# Patient Record
Sex: Female | Born: 1935 | Race: Black or African American | Hispanic: No | Smoking: Former smoker
Health system: Southern US, Community
[De-identification: ages and names within clinical notes are randomized; demographics above are authoritative.]

## PROBLEM LIST (undated history)

## (undated) DIAGNOSIS — N183 Chronic kidney disease, stage 3 unspecified: Secondary | ICD-10-CM

## (undated) DIAGNOSIS — R0600 Dyspnea, unspecified: Secondary | ICD-10-CM

## (undated) DIAGNOSIS — J189 Pneumonia, unspecified organism: Secondary | ICD-10-CM

## (undated) DIAGNOSIS — I639 Cerebral infarction, unspecified: Secondary | ICD-10-CM

## (undated) DIAGNOSIS — E785 Hyperlipidemia, unspecified: Secondary | ICD-10-CM

## (undated) DIAGNOSIS — I2510 Atherosclerotic heart disease of native coronary artery without angina pectoris: Secondary | ICD-10-CM

## (undated) DIAGNOSIS — I351 Nonrheumatic aortic (valve) insufficiency: Secondary | ICD-10-CM

## (undated) DIAGNOSIS — I509 Heart failure, unspecified: Secondary | ICD-10-CM

## (undated) DIAGNOSIS — K635 Polyp of colon: Secondary | ICD-10-CM

## (undated) DIAGNOSIS — E559 Vitamin D deficiency, unspecified: Secondary | ICD-10-CM

## (undated) DIAGNOSIS — H02105 Unspecified ectropion of left lower eyelid: Secondary | ICD-10-CM

## (undated) DIAGNOSIS — M1990 Unspecified osteoarthritis, unspecified site: Secondary | ICD-10-CM

## (undated) DIAGNOSIS — K219 Gastro-esophageal reflux disease without esophagitis: Secondary | ICD-10-CM

## (undated) DIAGNOSIS — I10 Essential (primary) hypertension: Secondary | ICD-10-CM

## (undated) DIAGNOSIS — D649 Anemia, unspecified: Secondary | ICD-10-CM

## (undated) HISTORY — DX: Hyperlipidemia, unspecified: E78.5

## (undated) HISTORY — PX: ABDOMINAL HYSTERECTOMY: SHX81

## (undated) HISTORY — DX: Gastro-esophageal reflux disease without esophagitis: K21.9

## (undated) HISTORY — DX: Nonrheumatic aortic (valve) insufficiency: I35.1

## (undated) HISTORY — DX: Anemia, unspecified: D64.9

## (undated) HISTORY — PX: CARDIAC CATHETERIZATION: SHX172

## (undated) HISTORY — DX: Cerebral infarction, unspecified: I63.9

## (undated) HISTORY — DX: Vitamin D deficiency, unspecified: E55.9

## (undated) HISTORY — PX: APPENDECTOMY: SHX54

## (undated) HISTORY — DX: Unspecified osteoarthritis, unspecified site: M19.90

## (undated) HISTORY — DX: Essential (primary) hypertension: I10

## (undated) HISTORY — DX: Atherosclerotic heart disease of native coronary artery without angina pectoris: I25.10

## (undated) HISTORY — DX: Polyp of colon: K63.5

## (undated) HISTORY — PX: TONSILLECTOMY: SUR1361

---

## 1973-07-01 DIAGNOSIS — I639 Cerebral infarction, unspecified: Secondary | ICD-10-CM

## 1973-07-01 HISTORY — DX: Cerebral infarction, unspecified: I63.9

## 1997-11-24 ENCOUNTER — Encounter: Admission: RE | Admit: 1997-11-24 | Discharge: 1997-11-24 | Payer: Self-pay | Admitting: Internal Medicine

## 1998-01-12 ENCOUNTER — Encounter: Admission: RE | Admit: 1998-01-12 | Discharge: 1998-01-12 | Payer: Self-pay | Admitting: Hematology and Oncology

## 1998-01-20 ENCOUNTER — Encounter: Admission: RE | Admit: 1998-01-20 | Discharge: 1998-01-20 | Payer: Self-pay | Admitting: Hematology and Oncology

## 1998-02-24 ENCOUNTER — Ambulatory Visit (HOSPITAL_BASED_OUTPATIENT_CLINIC_OR_DEPARTMENT_OTHER): Admission: RE | Admit: 1998-02-24 | Discharge: 1998-02-24 | Payer: Self-pay | Admitting: Orthopedic Surgery

## 1998-03-15 ENCOUNTER — Encounter: Admission: RE | Admit: 1998-03-15 | Discharge: 1998-06-13 | Payer: Self-pay | Admitting: Orthopedic Surgery

## 1998-03-24 ENCOUNTER — Encounter: Admission: RE | Admit: 1998-03-24 | Discharge: 1998-03-24 | Payer: Self-pay | Admitting: Internal Medicine

## 1998-04-07 ENCOUNTER — Encounter: Admission: RE | Admit: 1998-04-07 | Discharge: 1998-04-07 | Payer: Self-pay | Admitting: Internal Medicine

## 1998-04-07 ENCOUNTER — Ambulatory Visit (HOSPITAL_COMMUNITY): Admission: RE | Admit: 1998-04-07 | Discharge: 1998-04-07 | Payer: Self-pay | Admitting: Internal Medicine

## 1998-06-29 ENCOUNTER — Encounter: Payer: Self-pay | Admitting: Internal Medicine

## 1998-06-29 ENCOUNTER — Ambulatory Visit: Admission: RE | Admit: 1998-06-29 | Discharge: 1998-06-29 | Payer: Self-pay | Admitting: Internal Medicine

## 1998-07-05 ENCOUNTER — Ambulatory Visit (HOSPITAL_COMMUNITY): Admission: RE | Admit: 1998-07-05 | Discharge: 1998-07-05 | Payer: Self-pay | Admitting: Orthopedic Surgery

## 1998-07-12 ENCOUNTER — Encounter: Admission: RE | Admit: 1998-07-12 | Discharge: 1998-07-12 | Payer: Self-pay | Admitting: Hematology and Oncology

## 1998-08-17 ENCOUNTER — Encounter: Admission: RE | Admit: 1998-08-17 | Discharge: 1998-08-17 | Payer: Self-pay | Admitting: Hematology and Oncology

## 1998-09-02 ENCOUNTER — Emergency Department (HOSPITAL_COMMUNITY): Admission: EM | Admit: 1998-09-02 | Discharge: 1998-09-02 | Payer: Self-pay | Admitting: Emergency Medicine

## 1998-09-02 ENCOUNTER — Encounter: Payer: Self-pay | Admitting: Emergency Medicine

## 1998-09-04 ENCOUNTER — Encounter: Admission: RE | Admit: 1998-09-04 | Discharge: 1998-09-04 | Payer: Self-pay | Admitting: Internal Medicine

## 1998-09-25 ENCOUNTER — Encounter: Admission: RE | Admit: 1998-09-25 | Discharge: 1998-09-25 | Payer: Self-pay | Admitting: Internal Medicine

## 1998-10-04 ENCOUNTER — Encounter: Admission: RE | Admit: 1998-10-04 | Discharge: 1998-10-04 | Payer: Self-pay | Admitting: Hematology and Oncology

## 1998-10-21 ENCOUNTER — Emergency Department (HOSPITAL_COMMUNITY): Admission: EM | Admit: 1998-10-21 | Discharge: 1998-10-21 | Payer: Self-pay | Admitting: Emergency Medicine

## 1998-10-26 ENCOUNTER — Encounter: Admission: RE | Admit: 1998-10-26 | Discharge: 1998-10-26 | Payer: Self-pay | Admitting: Internal Medicine

## 1998-10-26 ENCOUNTER — Ambulatory Visit (HOSPITAL_COMMUNITY): Admission: RE | Admit: 1998-10-26 | Discharge: 1998-10-26 | Payer: Self-pay | Admitting: Internal Medicine

## 1998-10-26 ENCOUNTER — Encounter: Payer: Self-pay | Admitting: Internal Medicine

## 1998-11-13 ENCOUNTER — Encounter: Admission: RE | Admit: 1998-11-13 | Discharge: 1998-11-13 | Payer: Self-pay | Admitting: Internal Medicine

## 1998-12-19 ENCOUNTER — Encounter: Admission: RE | Admit: 1998-12-19 | Discharge: 1998-12-19 | Payer: Self-pay | Admitting: Internal Medicine

## 1999-01-12 ENCOUNTER — Encounter: Admission: RE | Admit: 1999-01-12 | Discharge: 1999-01-12 | Payer: Self-pay | Admitting: Internal Medicine

## 1999-01-14 ENCOUNTER — Encounter: Payer: Self-pay | Admitting: Emergency Medicine

## 1999-01-14 ENCOUNTER — Emergency Department (HOSPITAL_COMMUNITY): Admission: EM | Admit: 1999-01-14 | Discharge: 1999-01-14 | Payer: Self-pay | Admitting: Emergency Medicine

## 1999-03-13 ENCOUNTER — Encounter: Admission: RE | Admit: 1999-03-13 | Discharge: 1999-03-13 | Payer: Self-pay | Admitting: Internal Medicine

## 1999-03-15 ENCOUNTER — Encounter: Admission: RE | Admit: 1999-03-15 | Discharge: 1999-03-15 | Payer: Self-pay | Admitting: Hematology and Oncology

## 1999-04-04 ENCOUNTER — Encounter: Admission: RE | Admit: 1999-04-04 | Discharge: 1999-04-04 | Payer: Self-pay | Admitting: Internal Medicine

## 1999-05-04 ENCOUNTER — Encounter: Admission: RE | Admit: 1999-05-04 | Discharge: 1999-05-04 | Payer: Self-pay | Admitting: Internal Medicine

## 1999-05-04 ENCOUNTER — Ambulatory Visit (HOSPITAL_COMMUNITY): Admission: RE | Admit: 1999-05-04 | Discharge: 1999-05-04 | Payer: Self-pay | Admitting: Internal Medicine

## 1999-05-18 ENCOUNTER — Encounter: Admission: RE | Admit: 1999-05-18 | Discharge: 1999-05-18 | Payer: Self-pay | Admitting: Internal Medicine

## 1999-06-01 ENCOUNTER — Encounter: Admission: RE | Admit: 1999-06-01 | Discharge: 1999-06-01 | Payer: Self-pay | Admitting: Internal Medicine

## 1999-07-10 ENCOUNTER — Encounter: Admission: RE | Admit: 1999-07-10 | Discharge: 1999-07-10 | Payer: Self-pay | Admitting: Internal Medicine

## 1999-08-15 ENCOUNTER — Ambulatory Visit (HOSPITAL_COMMUNITY): Admission: RE | Admit: 1999-08-15 | Discharge: 1999-08-15 | Payer: Self-pay | Admitting: Internal Medicine

## 1999-09-21 ENCOUNTER — Encounter: Admission: RE | Admit: 1999-09-21 | Discharge: 1999-09-21 | Payer: Self-pay | Admitting: Internal Medicine

## 1999-10-30 ENCOUNTER — Encounter: Admission: RE | Admit: 1999-10-30 | Discharge: 1999-10-30 | Payer: Self-pay | Admitting: Internal Medicine

## 1999-11-20 ENCOUNTER — Encounter: Admission: RE | Admit: 1999-11-20 | Discharge: 1999-11-20 | Payer: Self-pay | Admitting: Internal Medicine

## 2000-01-23 ENCOUNTER — Ambulatory Visit (HOSPITAL_COMMUNITY): Admission: RE | Admit: 2000-01-23 | Discharge: 2000-01-23 | Payer: Self-pay | Admitting: Internal Medicine

## 2000-01-23 ENCOUNTER — Encounter: Payer: Self-pay | Admitting: Internal Medicine

## 2000-02-17 ENCOUNTER — Emergency Department (HOSPITAL_COMMUNITY): Admission: EM | Admit: 2000-02-17 | Discharge: 2000-02-17 | Payer: Self-pay | Admitting: Emergency Medicine

## 2000-03-28 ENCOUNTER — Ambulatory Visit (HOSPITAL_BASED_OUTPATIENT_CLINIC_OR_DEPARTMENT_OTHER): Admission: RE | Admit: 2000-03-28 | Discharge: 2000-03-28 | Payer: Self-pay | Admitting: Orthopedic Surgery

## 2000-04-17 ENCOUNTER — Encounter: Admission: RE | Admit: 2000-04-17 | Discharge: 2000-05-01 | Payer: Self-pay | Admitting: Orthopedic Surgery

## 2000-05-12 ENCOUNTER — Encounter: Admission: RE | Admit: 2000-05-12 | Discharge: 2000-05-12 | Payer: Self-pay | Admitting: Internal Medicine

## 2000-08-21 ENCOUNTER — Ambulatory Visit (HOSPITAL_COMMUNITY): Admission: RE | Admit: 2000-08-21 | Discharge: 2000-08-21 | Payer: Self-pay | Admitting: Internal Medicine

## 2000-08-26 ENCOUNTER — Encounter: Admission: RE | Admit: 2000-08-26 | Discharge: 2000-08-26 | Payer: Self-pay | Admitting: Internal Medicine

## 2000-11-04 ENCOUNTER — Encounter: Admission: RE | Admit: 2000-11-04 | Discharge: 2000-11-04 | Payer: Self-pay | Admitting: Internal Medicine

## 2001-01-03 ENCOUNTER — Emergency Department (HOSPITAL_COMMUNITY): Admission: EM | Admit: 2001-01-03 | Discharge: 2001-01-03 | Payer: Self-pay

## 2001-01-06 ENCOUNTER — Encounter: Admission: RE | Admit: 2001-01-06 | Discharge: 2001-01-06 | Payer: Self-pay | Admitting: Internal Medicine

## 2001-01-13 ENCOUNTER — Ambulatory Visit (HOSPITAL_COMMUNITY): Admission: RE | Admit: 2001-01-13 | Discharge: 2001-01-13 | Payer: Self-pay | Admitting: Internal Medicine

## 2001-01-13 ENCOUNTER — Encounter: Admission: RE | Admit: 2001-01-13 | Discharge: 2001-01-13 | Payer: Self-pay | Admitting: Internal Medicine

## 2001-01-13 ENCOUNTER — Encounter: Payer: Self-pay | Admitting: Internal Medicine

## 2001-04-27 ENCOUNTER — Encounter: Admission: RE | Admit: 2001-04-27 | Discharge: 2001-04-27 | Payer: Self-pay | Admitting: Internal Medicine

## 2001-07-06 ENCOUNTER — Encounter: Admission: RE | Admit: 2001-07-06 | Discharge: 2001-07-06 | Payer: Self-pay | Admitting: Internal Medicine

## 2001-08-12 ENCOUNTER — Encounter: Admission: RE | Admit: 2001-08-12 | Discharge: 2001-11-10 | Payer: Self-pay | Admitting: Orthopedic Surgery

## 2001-08-25 ENCOUNTER — Encounter: Admission: RE | Admit: 2001-08-25 | Discharge: 2001-08-25 | Payer: Self-pay | Admitting: Internal Medicine

## 2001-09-02 ENCOUNTER — Ambulatory Visit (HOSPITAL_COMMUNITY): Admission: RE | Admit: 2001-09-02 | Discharge: 2001-09-02 | Payer: Self-pay | Admitting: Internal Medicine

## 2001-09-09 ENCOUNTER — Encounter: Admission: RE | Admit: 2001-09-09 | Discharge: 2001-09-09 | Payer: Self-pay | Admitting: Internal Medicine

## 2001-10-07 ENCOUNTER — Encounter: Admission: RE | Admit: 2001-10-07 | Discharge: 2001-10-07 | Payer: Self-pay | Admitting: Internal Medicine

## 2001-10-08 ENCOUNTER — Ambulatory Visit (HOSPITAL_COMMUNITY): Admission: RE | Admit: 2001-10-08 | Discharge: 2001-10-08 | Payer: Self-pay | Admitting: Internal Medicine

## 2002-01-23 ENCOUNTER — Encounter: Payer: Self-pay | Admitting: Emergency Medicine

## 2002-01-23 ENCOUNTER — Emergency Department (HOSPITAL_COMMUNITY): Admission: EM | Admit: 2002-01-23 | Discharge: 2002-01-23 | Payer: Self-pay | Admitting: Emergency Medicine

## 2002-02-15 ENCOUNTER — Encounter: Admission: RE | Admit: 2002-02-15 | Discharge: 2002-02-15 | Payer: Self-pay | Admitting: Internal Medicine

## 2002-03-04 ENCOUNTER — Encounter: Admission: RE | Admit: 2002-03-04 | Discharge: 2002-03-04 | Payer: Self-pay | Admitting: Internal Medicine

## 2002-04-19 ENCOUNTER — Encounter: Admission: RE | Admit: 2002-04-19 | Discharge: 2002-04-19 | Payer: Self-pay | Admitting: Internal Medicine

## 2002-04-30 ENCOUNTER — Ambulatory Visit (HOSPITAL_COMMUNITY): Admission: RE | Admit: 2002-04-30 | Discharge: 2002-04-30 | Payer: Self-pay | Admitting: Internal Medicine

## 2002-05-25 ENCOUNTER — Encounter: Admission: RE | Admit: 2002-05-25 | Discharge: 2002-05-25 | Payer: Self-pay | Admitting: Internal Medicine

## 2002-08-09 ENCOUNTER — Encounter: Admission: RE | Admit: 2002-08-09 | Discharge: 2002-08-09 | Payer: Self-pay | Admitting: Internal Medicine

## 2002-09-29 ENCOUNTER — Ambulatory Visit (HOSPITAL_COMMUNITY): Admission: RE | Admit: 2002-09-29 | Discharge: 2002-09-29 | Payer: Self-pay | Admitting: Internal Medicine

## 2002-10-06 ENCOUNTER — Encounter: Admission: RE | Admit: 2002-10-06 | Discharge: 2002-10-06 | Payer: Self-pay | Admitting: Internal Medicine

## 2002-12-13 ENCOUNTER — Encounter: Payer: Self-pay | Admitting: Internal Medicine

## 2002-12-13 ENCOUNTER — Ambulatory Visit (HOSPITAL_COMMUNITY): Admission: RE | Admit: 2002-12-13 | Discharge: 2002-12-13 | Payer: Self-pay | Admitting: Internal Medicine

## 2002-12-13 ENCOUNTER — Encounter: Admission: RE | Admit: 2002-12-13 | Discharge: 2002-12-13 | Payer: Self-pay | Admitting: Internal Medicine

## 2002-12-22 ENCOUNTER — Encounter: Admission: RE | Admit: 2002-12-22 | Discharge: 2002-12-22 | Payer: Self-pay | Admitting: Internal Medicine

## 2003-01-24 ENCOUNTER — Encounter: Admission: RE | Admit: 2003-01-24 | Discharge: 2003-01-24 | Payer: Self-pay | Admitting: Internal Medicine

## 2003-04-20 ENCOUNTER — Encounter: Admission: RE | Admit: 2003-04-20 | Discharge: 2003-04-20 | Payer: Self-pay | Admitting: Internal Medicine

## 2003-09-08 ENCOUNTER — Ambulatory Visit (HOSPITAL_COMMUNITY): Admission: RE | Admit: 2003-09-08 | Discharge: 2003-09-08 | Payer: Self-pay | Admitting: Internal Medicine

## 2003-09-08 ENCOUNTER — Encounter: Admission: RE | Admit: 2003-09-08 | Discharge: 2003-09-08 | Payer: Self-pay | Admitting: Internal Medicine

## 2003-10-14 ENCOUNTER — Ambulatory Visit (HOSPITAL_COMMUNITY): Admission: RE | Admit: 2003-10-14 | Discharge: 2003-10-14 | Payer: Self-pay | Admitting: Internal Medicine

## 2003-10-21 ENCOUNTER — Encounter: Admission: RE | Admit: 2003-10-21 | Discharge: 2003-10-21 | Payer: Self-pay | Admitting: Internal Medicine

## 2004-03-19 ENCOUNTER — Ambulatory Visit: Payer: Self-pay | Admitting: Internal Medicine

## 2004-03-26 ENCOUNTER — Ambulatory Visit: Payer: Self-pay | Admitting: Internal Medicine

## 2004-03-28 ENCOUNTER — Encounter: Admission: RE | Admit: 2004-03-28 | Discharge: 2004-05-07 | Payer: Self-pay | Admitting: Internal Medicine

## 2004-04-11 ENCOUNTER — Ambulatory Visit: Payer: Self-pay | Admitting: Internal Medicine

## 2004-06-06 ENCOUNTER — Ambulatory Visit: Payer: Self-pay | Admitting: Internal Medicine

## 2004-07-17 ENCOUNTER — Ambulatory Visit: Payer: Self-pay | Admitting: Internal Medicine

## 2004-10-16 ENCOUNTER — Ambulatory Visit (HOSPITAL_COMMUNITY): Admission: RE | Admit: 2004-10-16 | Discharge: 2004-10-16 | Payer: Self-pay | Admitting: Internal Medicine

## 2004-10-18 ENCOUNTER — Ambulatory Visit: Payer: Self-pay | Admitting: Internal Medicine

## 2004-10-25 ENCOUNTER — Ambulatory Visit (HOSPITAL_COMMUNITY): Admission: RE | Admit: 2004-10-25 | Discharge: 2004-10-25 | Payer: Self-pay | Admitting: Internal Medicine

## 2004-10-26 ENCOUNTER — Ambulatory Visit: Payer: Self-pay | Admitting: Internal Medicine

## 2004-11-30 ENCOUNTER — Ambulatory Visit: Payer: Self-pay | Admitting: Internal Medicine

## 2005-01-07 ENCOUNTER — Ambulatory Visit: Payer: Self-pay | Admitting: Internal Medicine

## 2005-01-07 ENCOUNTER — Ambulatory Visit (HOSPITAL_COMMUNITY): Admission: RE | Admit: 2005-01-07 | Discharge: 2005-01-07 | Payer: Self-pay | Admitting: Internal Medicine

## 2005-05-28 ENCOUNTER — Ambulatory Visit: Payer: Self-pay | Admitting: Internal Medicine

## 2005-06-16 ENCOUNTER — Emergency Department (HOSPITAL_COMMUNITY): Admission: EM | Admit: 2005-06-16 | Discharge: 2005-06-16 | Payer: Self-pay | Admitting: Emergency Medicine

## 2005-06-27 ENCOUNTER — Emergency Department (HOSPITAL_COMMUNITY): Admission: EM | Admit: 2005-06-27 | Discharge: 2005-06-27 | Payer: Self-pay | Admitting: Emergency Medicine

## 2005-06-29 ENCOUNTER — Emergency Department (HOSPITAL_COMMUNITY): Admission: EM | Admit: 2005-06-29 | Discharge: 2005-06-29 | Payer: Self-pay | Admitting: Family Medicine

## 2005-08-07 ENCOUNTER — Ambulatory Visit: Payer: Self-pay | Admitting: Internal Medicine

## 2005-09-13 ENCOUNTER — Ambulatory Visit: Payer: Self-pay | Admitting: Internal Medicine

## 2005-09-20 ENCOUNTER — Ambulatory Visit: Payer: Self-pay | Admitting: Internal Medicine

## 2005-10-30 ENCOUNTER — Encounter: Payer: Self-pay | Admitting: Internal Medicine

## 2005-11-08 ENCOUNTER — Ambulatory Visit (HOSPITAL_COMMUNITY): Admission: RE | Admit: 2005-11-08 | Discharge: 2005-11-08 | Payer: Self-pay | Admitting: Internal Medicine

## 2006-03-31 ENCOUNTER — Ambulatory Visit: Payer: Self-pay | Admitting: Internal Medicine

## 2006-04-11 ENCOUNTER — Ambulatory Visit (HOSPITAL_COMMUNITY): Admission: RE | Admit: 2006-04-11 | Discharge: 2006-04-11 | Payer: Self-pay | Admitting: Internal Medicine

## 2006-04-11 ENCOUNTER — Ambulatory Visit: Payer: Self-pay | Admitting: Internal Medicine

## 2006-05-30 ENCOUNTER — Ambulatory Visit: Payer: Self-pay | Admitting: Internal Medicine

## 2006-06-30 ENCOUNTER — Ambulatory Visit: Payer: Self-pay | Admitting: Internal Medicine

## 2006-08-29 ENCOUNTER — Ambulatory Visit: Payer: Self-pay | Admitting: Internal Medicine

## 2006-08-29 DIAGNOSIS — E538 Deficiency of other specified B group vitamins: Secondary | ICD-10-CM | POA: Insufficient documentation

## 2006-09-24 ENCOUNTER — Ambulatory Visit: Payer: Self-pay | Admitting: Internal Medicine

## 2006-09-24 DIAGNOSIS — F411 Generalized anxiety disorder: Secondary | ICD-10-CM | POA: Insufficient documentation

## 2006-09-24 DIAGNOSIS — E785 Hyperlipidemia, unspecified: Secondary | ICD-10-CM

## 2006-09-24 DIAGNOSIS — R42 Dizziness and giddiness: Secondary | ICD-10-CM | POA: Insufficient documentation

## 2006-09-24 DIAGNOSIS — M949 Disorder of cartilage, unspecified: Secondary | ICD-10-CM

## 2006-09-24 DIAGNOSIS — Z8719 Personal history of other diseases of the digestive system: Secondary | ICD-10-CM | POA: Insufficient documentation

## 2006-09-24 DIAGNOSIS — F419 Anxiety disorder, unspecified: Secondary | ICD-10-CM | POA: Insufficient documentation

## 2006-09-24 DIAGNOSIS — M899 Disorder of bone, unspecified: Secondary | ICD-10-CM | POA: Insufficient documentation

## 2006-09-24 DIAGNOSIS — D649 Anemia, unspecified: Secondary | ICD-10-CM

## 2006-09-24 DIAGNOSIS — K5730 Diverticulosis of large intestine without perforation or abscess without bleeding: Secondary | ICD-10-CM | POA: Insufficient documentation

## 2006-09-24 DIAGNOSIS — M1990 Unspecified osteoarthritis, unspecified site: Secondary | ICD-10-CM | POA: Insufficient documentation

## 2006-09-24 DIAGNOSIS — D518 Other vitamin B12 deficiency anemias: Secondary | ICD-10-CM

## 2006-09-24 DIAGNOSIS — I10 Essential (primary) hypertension: Secondary | ICD-10-CM

## 2006-09-24 LAB — CONVERTED CEMR LAB
Basophils Relative: 0 % (ref 0–1)
Chloride: 107 meq/L (ref 96–112)
HCT: 35.8 % — ABNORMAL LOW (ref 36.0–46.0)
Lymphs Abs: 1.5 10*3/uL (ref 0.7–3.3)
MCHC: 32.4 g/dL (ref 30.0–36.0)
MCV: 94.7 fL (ref 78.0–100.0)
Monocytes Absolute: 0.4 10*3/uL (ref 0.2–0.7)
Neutrophils Relative %: 55 % (ref 43–77)
Platelets: 178 10*3/uL (ref 150–400)
Potassium: 4.3 meq/L (ref 3.5–5.3)
Vitamin B-12: >2000 pg/mL — ABNORMAL HIGH (ref 211–911)

## 2006-10-01 ENCOUNTER — Telehealth (INDEPENDENT_AMBULATORY_CARE_PROVIDER_SITE_OTHER): Payer: Self-pay | Admitting: Internal Medicine

## 2006-10-13 ENCOUNTER — Ambulatory Visit: Payer: Self-pay | Admitting: Hospitalist

## 2006-10-13 ENCOUNTER — Encounter: Payer: Self-pay | Admitting: Internal Medicine

## 2006-10-13 DIAGNOSIS — R262 Difficulty in walking, not elsewhere classified: Secondary | ICD-10-CM | POA: Insufficient documentation

## 2006-10-13 LAB — CONVERTED CEMR LAB
BUN: 30 mg/dL — ABNORMAL HIGH (ref 6–23)
Chloride: 109 meq/L (ref 96–112)
Creatinine, Ser: 0.95 mg/dL (ref 0.40–1.20)
Glucose, Bld: 90 mg/dL (ref 70–99)

## 2006-10-27 ENCOUNTER — Encounter (INDEPENDENT_AMBULATORY_CARE_PROVIDER_SITE_OTHER): Payer: Self-pay | Admitting: *Deleted

## 2006-10-27 ENCOUNTER — Ambulatory Visit: Payer: Self-pay | Admitting: Internal Medicine

## 2006-11-26 ENCOUNTER — Ambulatory Visit: Payer: Self-pay | Admitting: Internal Medicine

## 2006-12-24 ENCOUNTER — Ambulatory Visit: Payer: Self-pay | Admitting: Internal Medicine

## 2006-12-24 ENCOUNTER — Ambulatory Visit (HOSPITAL_COMMUNITY): Admission: RE | Admit: 2006-12-24 | Discharge: 2006-12-24 | Payer: Self-pay | Admitting: Internal Medicine

## 2006-12-24 DIAGNOSIS — M25539 Pain in unspecified wrist: Secondary | ICD-10-CM | POA: Insufficient documentation

## 2006-12-25 LAB — CONVERTED CEMR LAB
Alkaline Phosphatase: 81 units/L (ref 39–117)
Bilirubin, Direct: 0.1 mg/dL (ref 0.0–0.3)
Indirect Bilirubin: 0.2 mg/dL (ref 0.0–0.9)
LDL Cholesterol: 101 mg/dL — ABNORMAL HIGH (ref 0–99)
Total Bilirubin: 0.3 mg/dL (ref 0.3–1.2)
Total Protein: 8 g/dL (ref 6.0–8.3)
Triglycerides: 45 mg/dL (ref <150)

## 2006-12-31 ENCOUNTER — Ambulatory Visit: Payer: Self-pay | Admitting: Hospitalist

## 2007-02-12 ENCOUNTER — Ambulatory Visit: Payer: Self-pay | Admitting: Internal Medicine

## 2007-03-26 ENCOUNTER — Telehealth (INDEPENDENT_AMBULATORY_CARE_PROVIDER_SITE_OTHER): Payer: Self-pay | Admitting: *Deleted

## 2007-04-15 ENCOUNTER — Emergency Department (HOSPITAL_COMMUNITY): Admission: EM | Admit: 2007-04-15 | Discharge: 2007-04-15 | Payer: Self-pay | Admitting: Emergency Medicine

## 2007-04-15 ENCOUNTER — Encounter: Payer: Self-pay | Admitting: Pulmonary Disease

## 2007-04-20 ENCOUNTER — Telehealth: Payer: Self-pay | Admitting: *Deleted

## 2007-04-24 ENCOUNTER — Telehealth (INDEPENDENT_AMBULATORY_CARE_PROVIDER_SITE_OTHER): Payer: Self-pay | Admitting: *Deleted

## 2007-04-27 ENCOUNTER — Ambulatory Visit: Payer: Self-pay | Admitting: Hospitalist

## 2007-04-28 ENCOUNTER — Emergency Department (HOSPITAL_COMMUNITY): Admission: EM | Admit: 2007-04-28 | Discharge: 2007-04-28 | Payer: Self-pay | Admitting: Emergency Medicine

## 2007-05-06 ENCOUNTER — Encounter: Payer: Self-pay | Admitting: Pulmonary Disease

## 2007-05-12 ENCOUNTER — Ambulatory Visit: Payer: Self-pay | Admitting: *Deleted

## 2007-05-12 ENCOUNTER — Encounter: Payer: Self-pay | Admitting: Internal Medicine

## 2007-05-12 LAB — CONVERTED CEMR LAB
BUN: 22 mg/dL (ref 6–23)
CO2: 21 meq/L (ref 19–32)
Calcium: 9.3 mg/dL (ref 8.4–10.5)
Chloride: 107 meq/L (ref 96–112)
Creatinine, Ser: 0.86 mg/dL (ref 0.40–1.20)
Glucose, Bld: 81 mg/dL (ref 70–99)

## 2007-05-27 ENCOUNTER — Ambulatory Visit: Payer: Self-pay | Admitting: Hospitalist

## 2007-06-18 ENCOUNTER — Ambulatory Visit: Payer: Self-pay | Admitting: Internal Medicine

## 2007-07-02 DIAGNOSIS — K219 Gastro-esophageal reflux disease without esophagitis: Secondary | ICD-10-CM

## 2007-07-02 HISTORY — DX: Gastro-esophageal reflux disease without esophagitis: K21.9

## 2007-07-13 ENCOUNTER — Telehealth: Payer: Self-pay | Admitting: Internal Medicine

## 2007-07-24 ENCOUNTER — Ambulatory Visit: Payer: Self-pay | Admitting: Internal Medicine

## 2007-07-24 ENCOUNTER — Encounter: Payer: Self-pay | Admitting: Internal Medicine

## 2007-07-24 LAB — CONVERTED CEMR LAB
BUN: 25 mg/dL — ABNORMAL HIGH (ref 6–23)
CO2: 19 meq/L (ref 19–32)
Chloride: 108 meq/L (ref 96–112)
Glucose, Bld: 85 mg/dL (ref 70–99)
HCT: 31.7 % — ABNORMAL LOW (ref 36.0–46.0)
LDL Cholesterol: 101 mg/dL — ABNORMAL HIGH (ref 0–99)
Platelets: 171 10*3/uL (ref 150–400)
Potassium: 4.4 meq/L (ref 3.5–5.3)
Sodium: 142 meq/L (ref 135–145)
TSH: 0.993 microintl units/mL (ref 0.350–5.50)
Triglycerides: 53 mg/dL (ref <150)
VLDL: 11 mg/dL (ref 0–40)
WBC: 4.6 10*3/uL (ref 4.0–10.5)

## 2007-07-27 ENCOUNTER — Emergency Department (HOSPITAL_COMMUNITY): Admission: EM | Admit: 2007-07-27 | Discharge: 2007-07-27 | Payer: Self-pay | Admitting: Emergency Medicine

## 2007-07-31 ENCOUNTER — Ambulatory Visit: Payer: Self-pay | Admitting: Gastroenterology

## 2007-07-31 LAB — CONVERTED CEMR LAB
ALT: 12 units/L (ref 0–35)
Albumin: 4.1 g/dL (ref 3.5–5.2)
Alkaline Phosphatase: 81 units/L (ref 39–117)
Basophils Absolute: 0 10*3/uL (ref 0.0–0.1)
Bilirubin, Direct: 0.2 mg/dL (ref 0.0–0.3)
Calcium: 9.8 mg/dL (ref 8.4–10.5)
Chloride: 109 meq/L (ref 96–112)
Ferritin: 87.3 ng/mL (ref 10.0–291.0)
Folate: 16.6 ng/mL
GFR calc non Af Amer: 65 mL/min
Glucose, Bld: 111 mg/dL — ABNORMAL HIGH (ref 70–99)
Hemoglobin: 11 g/dL — ABNORMAL LOW (ref 12.0–15.0)
MCV: 94.9 fL (ref 78.0–100.0)
Monocytes Absolute: 0.4 10*3/uL (ref 0.2–0.7)
Neutrophils Relative %: 56.7 % (ref 43.0–77.0)
Potassium: 4.4 meq/L (ref 3.5–5.1)
TSH: 0.76 microintl units/mL (ref 0.35–5.50)
Total Bilirubin: 0.8 mg/dL (ref 0.3–1.2)
Total Protein: 8.1 g/dL (ref 6.0–8.3)
Vitamin B-12: >1500 pg/mL — ABNORMAL HIGH (ref 211–911)

## 2007-08-12 ENCOUNTER — Encounter: Payer: Self-pay | Admitting: Gastroenterology

## 2007-08-12 ENCOUNTER — Encounter: Payer: Self-pay | Admitting: Internal Medicine

## 2007-08-12 ENCOUNTER — Ambulatory Visit: Payer: Self-pay | Admitting: Gastroenterology

## 2007-08-26 ENCOUNTER — Encounter: Payer: Self-pay | Admitting: Internal Medicine

## 2007-08-26 ENCOUNTER — Ambulatory Visit: Payer: Self-pay | Admitting: Internal Medicine

## 2007-08-26 LAB — CONVERTED CEMR LAB
Albumin: 4.3 g/dL (ref 3.5–5.2)
Alkaline Phosphatase: 72 units/L (ref 39–117)
BUN: 13 mg/dL (ref 6–23)
CO2: 23 meq/L (ref 19–32)
Calcium: 9.2 mg/dL (ref 8.4–10.5)
Cholesterol: 166 mg/dL (ref 0–200)
Glucose, Bld: 85 mg/dL (ref 70–99)
LDL Cholesterol: 96 mg/dL (ref 0–99)
Total CHOL/HDL Ratio: 2.8
Triglycerides: 57 mg/dL (ref <150)

## 2007-09-11 ENCOUNTER — Telehealth: Payer: Self-pay | Admitting: Internal Medicine

## 2007-09-28 ENCOUNTER — Telehealth: Payer: Self-pay | Admitting: Internal Medicine

## 2007-09-28 ENCOUNTER — Ambulatory Visit: Payer: Self-pay | Admitting: Internal Medicine

## 2007-09-29 ENCOUNTER — Telehealth: Payer: Self-pay | Admitting: Internal Medicine

## 2007-10-02 ENCOUNTER — Ambulatory Visit: Payer: Self-pay | Admitting: Gastroenterology

## 2007-10-15 ENCOUNTER — Emergency Department (HOSPITAL_COMMUNITY): Admission: EM | Admit: 2007-10-15 | Discharge: 2007-10-15 | Payer: Self-pay | Admitting: Emergency Medicine

## 2007-10-28 ENCOUNTER — Ambulatory Visit: Payer: Self-pay | Admitting: Internal Medicine

## 2007-10-29 ENCOUNTER — Ambulatory Visit: Payer: Self-pay | Admitting: Pulmonary Disease

## 2007-10-29 DIAGNOSIS — K219 Gastro-esophageal reflux disease without esophagitis: Secondary | ICD-10-CM

## 2007-10-29 DIAGNOSIS — Z8673 Personal history of transient ischemic attack (TIA), and cerebral infarction without residual deficits: Secondary | ICD-10-CM

## 2007-10-29 DIAGNOSIS — R042 Hemoptysis: Secondary | ICD-10-CM | POA: Insufficient documentation

## 2007-11-04 ENCOUNTER — Encounter: Payer: Self-pay | Admitting: Pulmonary Disease

## 2007-11-04 ENCOUNTER — Ambulatory Visit: Admission: RE | Admit: 2007-11-04 | Discharge: 2007-11-04 | Payer: Self-pay | Admitting: Pulmonary Disease

## 2007-11-04 ENCOUNTER — Ambulatory Visit: Payer: Self-pay | Admitting: Pulmonary Disease

## 2007-11-09 ENCOUNTER — Telehealth (INDEPENDENT_AMBULATORY_CARE_PROVIDER_SITE_OTHER): Payer: Self-pay | Admitting: *Deleted

## 2007-11-10 ENCOUNTER — Telehealth (INDEPENDENT_AMBULATORY_CARE_PROVIDER_SITE_OTHER): Payer: Self-pay | Admitting: *Deleted

## 2007-11-26 ENCOUNTER — Ambulatory Visit: Payer: Self-pay | Admitting: *Deleted

## 2007-11-27 ENCOUNTER — Ambulatory Visit: Payer: Self-pay | Admitting: Pulmonary Disease

## 2007-11-27 ENCOUNTER — Telehealth (INDEPENDENT_AMBULATORY_CARE_PROVIDER_SITE_OTHER): Payer: Self-pay | Admitting: *Deleted

## 2007-12-23 ENCOUNTER — Ambulatory Visit: Payer: Self-pay | Admitting: Thoracic Surgery

## 2007-12-28 ENCOUNTER — Ambulatory Visit: Payer: Self-pay | Admitting: Internal Medicine

## 2008-01-07 ENCOUNTER — Ambulatory Visit: Payer: Self-pay | Admitting: Infectious Diseases

## 2008-01-07 DIAGNOSIS — L259 Unspecified contact dermatitis, unspecified cause: Secondary | ICD-10-CM

## 2008-01-08 ENCOUNTER — Emergency Department (HOSPITAL_COMMUNITY): Admission: EM | Admit: 2008-01-08 | Discharge: 2008-01-08 | Payer: Self-pay | Admitting: Emergency Medicine

## 2008-01-12 ENCOUNTER — Ambulatory Visit (HOSPITAL_COMMUNITY): Admission: RE | Admit: 2008-01-12 | Discharge: 2008-01-12 | Payer: Self-pay | Admitting: Thoracic Surgery

## 2008-01-13 ENCOUNTER — Ambulatory Visit: Payer: Self-pay | Admitting: Thoracic Surgery

## 2008-01-13 ENCOUNTER — Encounter: Admission: RE | Admit: 2008-01-13 | Discharge: 2008-01-13 | Payer: Self-pay | Admitting: Thoracic Surgery

## 2008-02-15 ENCOUNTER — Ambulatory Visit: Payer: Self-pay | Admitting: *Deleted

## 2008-03-25 ENCOUNTER — Ambulatory Visit: Payer: Self-pay | Admitting: Internal Medicine

## 2008-04-18 ENCOUNTER — Encounter: Payer: Self-pay | Admitting: Internal Medicine

## 2008-04-18 ENCOUNTER — Ambulatory Visit: Payer: Self-pay | Admitting: Internal Medicine

## 2008-04-18 DIAGNOSIS — H9209 Otalgia, unspecified ear: Secondary | ICD-10-CM | POA: Insufficient documentation

## 2008-04-18 DIAGNOSIS — R011 Cardiac murmur, unspecified: Secondary | ICD-10-CM

## 2008-04-24 LAB — CONVERTED CEMR LAB
ALT: 8 units/L (ref 0–35)
Albumin: 4.3 g/dL (ref 3.5–5.2)
Alkaline Phosphatase: 70 units/L (ref 39–117)
CO2: 20 meq/L (ref 19–32)
Cholesterol: 202 mg/dL — ABNORMAL HIGH (ref 0–200)
Creatinine, Ser: 0.96 mg/dL (ref 0.40–1.20)
Eosinophils Absolute: 0 10*3/uL (ref 0.0–0.7)
Eosinophils Relative: 0 % (ref 0–5)
Glucose, Bld: 81 mg/dL (ref 70–99)
HCT: 32.6 % — ABNORMAL LOW (ref 36.0–46.0)
HDL: 57 mg/dL (ref >39)
LDL Cholesterol: 135 mg/dL — ABNORMAL HIGH (ref 0–99)
Lymphocytes Relative: 43 % (ref 12–46)
Lymphs Abs: 1.8 10*3/uL (ref 0.7–4.0)
Monocytes Absolute: 0.4 10*3/uL (ref 0.1–1.0)
Neutro Abs: 2 10*3/uL (ref 1.7–7.7)
Neutrophils Relative %: 48 % (ref 43–77)
Platelets: 170 10*3/uL (ref 150–400)
RBC: 3.54 M/uL — ABNORMAL LOW (ref 3.87–5.11)
Sodium: 142 meq/L (ref 135–145)
TSH: 0.926 microintl units/mL (ref 0.350–4.50)
Total Bilirubin: 0.7 mg/dL (ref 0.3–1.2)
Vit D, 1,25-Dihydroxy: 5 — ABNORMAL LOW (ref 30–89)

## 2008-04-25 ENCOUNTER — Ambulatory Visit: Payer: Self-pay | Admitting: Internal Medicine

## 2008-04-25 DIAGNOSIS — M25519 Pain in unspecified shoulder: Secondary | ICD-10-CM

## 2008-04-25 DIAGNOSIS — E559 Vitamin D deficiency, unspecified: Secondary | ICD-10-CM

## 2008-04-28 ENCOUNTER — Ambulatory Visit (HOSPITAL_COMMUNITY): Admission: RE | Admit: 2008-04-28 | Discharge: 2008-04-28 | Payer: Self-pay | Admitting: Internal Medicine

## 2008-04-28 ENCOUNTER — Encounter: Payer: Self-pay | Admitting: Internal Medicine

## 2008-04-29 DIAGNOSIS — I359 Nonrheumatic aortic valve disorder, unspecified: Secondary | ICD-10-CM

## 2008-08-29 ENCOUNTER — Ambulatory Visit: Payer: Self-pay | Admitting: Internal Medicine

## 2008-08-29 ENCOUNTER — Encounter: Payer: Self-pay | Admitting: Internal Medicine

## 2008-10-14 ENCOUNTER — Ambulatory Visit: Payer: Self-pay | Admitting: Internal Medicine

## 2009-01-06 ENCOUNTER — Ambulatory Visit: Payer: Self-pay | Admitting: Internal Medicine

## 2009-02-13 ENCOUNTER — Ambulatory Visit: Payer: Self-pay | Admitting: Internal Medicine

## 2010-03-08 ENCOUNTER — Telehealth: Payer: Self-pay | Admitting: *Deleted

## 2010-06-15 ENCOUNTER — Ambulatory Visit: Payer: Self-pay

## 2010-07-22 ENCOUNTER — Encounter: Payer: Self-pay | Admitting: Internal Medicine

## 2010-07-31 NOTE — Progress Notes (Signed)
Summary: Refill/gh  Phone Note Refill Request Message from:  Fax from Pharmacy on March 08, 2010 5:04 PM  Refills Requested: Medication #1:  OMEPRAZOLE 20 MG CPDR take 2 pills together a day.   Last Refilled: 02/09/2010  Last appointments 03/2008.   Method Requested: Electronic Initial call taken by: Angelina Ok RN,  March 08, 2010 5:04 PM  Follow-up for Phone Call        Has not been seen for teo years and not even in [facility] for Vit B12 for over 1 year. Denied. Pt must been seen to get any more refills. Per [facility] the only reason for PPI is GERD - no indication of bleeding ulcers,etc. Follow-up by: Blanch Media MD,  March 08, 2010 5:15 PM  Additional Follow-up for Phone Call Additional follow up Details #1::        tried to call pt's ph#, no answer, pharm notified that pt needed to be seen Additional Follow-up by: Marin Roberts RN,  March 09, 2010 2:12 PM

## 2010-07-31 NOTE — Procedures (Signed)
Summary: [facility].: EGD  [facility].: EGD   Imported By: Florinda Marker 09/06/2009 14:01:18  _____________________________________________________________________  External Attachment:    Type:   Image     Comment:   External Document

## 2010-09-24 ENCOUNTER — Other Ambulatory Visit: Payer: Self-pay | Admitting: Internal Medicine

## 2010-09-25 NOTE — Telephone Encounter (Signed)
I cannot find anything within living memory other than no-shows and canceled appointments.  No labs since sometime in 2010.  Benezepril cannot be used safely w/o labs.  If this patient will make (and KEEP) an app't, may refill exactly enough until the morning of that app't.  Must come in for a Bmet before calling in the refill. Please correct me if I overlooked a visit or a lab.

## 2010-09-25 NOTE — Telephone Encounter (Signed)
Please refuse the medication and i will send this note to scheduling  Thank you

## 2010-09-28 ENCOUNTER — Other Ambulatory Visit (INDEPENDENT_AMBULATORY_CARE_PROVIDER_SITE_OTHER): Payer: Medicare Other | Admitting: *Deleted

## 2010-09-28 ENCOUNTER — Other Ambulatory Visit: Payer: Medicare Other

## 2010-09-28 DIAGNOSIS — I10 Essential (primary) hypertension: Secondary | ICD-10-CM

## 2010-09-28 LAB — BASIC METABOLIC PANEL WITH GFR
BUN: 20 mg/dL (ref 6–23)
Chloride: 110 mEq/L (ref 96–112)
GFR, Est African American: >60 mL/min (ref >60)
GFR, Est Non African American: 53 mL/min — ABNORMAL LOW (ref >60)
Potassium: 4 mEq/L (ref 3.5–5.3)

## 2010-09-28 MED ORDER — BENAZEPRIL-HYDROCHLOROTHIAZIDE 20-25 MG PO TABS: 1.0000 | ORAL_TABLET | Freq: Every day | ORAL | Status: DC

## 2010-09-28 NOTE — Telephone Encounter (Signed)
BMET is OK. Will refill for 1 month, on the understanding that patient keep an appointment within 1 month.  Please make an appointment and notify patient.

## 2010-09-28 NOTE — Telephone Encounter (Signed)
Please refer to request 3/26 and review lab results

## 2010-10-26 ENCOUNTER — Encounter: Payer: Medicare Other | Admitting: Internal Medicine

## 2010-10-29 ENCOUNTER — Other Ambulatory Visit (INDEPENDENT_AMBULATORY_CARE_PROVIDER_SITE_OTHER): Payer: Medicare Other | Admitting: *Deleted

## 2010-10-29 ENCOUNTER — Encounter: Payer: Medicare Other | Admitting: Internal Medicine

## 2010-10-29 DIAGNOSIS — I10 Essential (primary) hypertension: Secondary | ICD-10-CM

## 2010-10-31 ENCOUNTER — Other Ambulatory Visit (INDEPENDENT_AMBULATORY_CARE_PROVIDER_SITE_OTHER): Payer: Medicare Other | Admitting: Internal Medicine

## 2010-10-31 DIAGNOSIS — I10 Essential (primary) hypertension: Secondary | ICD-10-CM

## 2010-11-02 ENCOUNTER — Ambulatory Visit: Payer: Medicare Other | Admitting: Internal Medicine

## 2010-11-02 MED ORDER — BENAZEPRIL-HYDROCHLOROTHIAZIDE 20-25 MG PO TABS: 1.0000 | ORAL_TABLET | Freq: Every day | ORAL | Status: DC

## 2010-11-02 NOTE — Telephone Encounter (Signed)
Last seen 12/09. Cancelled last three appts in 2012. Has appt in a few days. Will refill 30 days. She must keep this appt to continue to receive meds

## 2010-11-02 NOTE — Telephone Encounter (Signed)
Pt informed and will keep appointment.

## 2010-11-06 NOTE — Telephone Encounter (Signed)
Will be assigned new PCP July 1.

## 2010-11-07 ENCOUNTER — Ambulatory Visit (INDEPENDENT_AMBULATORY_CARE_PROVIDER_SITE_OTHER): Payer: Medicare Other | Admitting: Internal Medicine

## 2010-11-07 ENCOUNTER — Encounter: Payer: Self-pay | Admitting: Internal Medicine

## 2010-11-07 DIAGNOSIS — D509 Iron deficiency anemia, unspecified: Secondary | ICD-10-CM

## 2010-11-07 DIAGNOSIS — E785 Hyperlipidemia, unspecified: Secondary | ICD-10-CM

## 2010-11-07 DIAGNOSIS — K219 Gastro-esophageal reflux disease without esophagitis: Secondary | ICD-10-CM

## 2010-11-07 DIAGNOSIS — E538 Deficiency of other specified B group vitamins: Secondary | ICD-10-CM

## 2010-11-07 MED ORDER — CHLORTHALIDONE 15 MG PO TABS: 15.0000 mg | ORAL_TABLET | Freq: Every day | ORAL | Status: DC

## 2010-11-07 MED ORDER — OMEPRAZOLE 20 MG PO CPDR: 20.0000 mg | DELAYED_RELEASE_CAPSULE | Freq: Two times a day (BID) | ORAL | Status: DC

## 2010-11-07 MED ORDER — BENAZEPRIL HCL 40 MG PO TABS: 40.0000 mg | ORAL_TABLET | Freq: Every day | ORAL | Status: DC

## 2010-11-07 MED ORDER — LOSARTAN POTASSIUM 50 MG PO TABS: 50.0000 mg | ORAL_TABLET | Freq: Every day | ORAL | Status: DC

## 2010-11-07 MED ORDER — FERROUS GLUCONATE 240 (27 FE) MG PO TABS: 240.0000 mg | ORAL_TABLET | Freq: Every day | ORAL | Status: DC

## 2010-11-07 MED ORDER — METOPROLOL SUCCINATE ER 200 MG PO TB24: 200.0000 mg | ORAL_TABLET | Freq: Every day | ORAL | Status: DC

## 2010-11-07 NOTE — Progress Notes (Signed)
  Subjective:    Patient ID: Jane [name], female    DOB: [birth year], 77 y.o.   MRN: [id number]  HPI Here afetr a 2 year gap in visits after we restricted her refills to ensure follow up  77 yr old who I know from previous visits with HTN, anemia, history of diverticulosis and colonic polyps by colonoscopy 2009, hiatal hernia (EGD 2009) and mild aortic insufficiency by cardiac echo. She is a simple historian, and requires a number of repeated instructions, this is unchanged from my visits with her in 2004-5.  Her gait and function unchanged-able to shop, cook, maintain her apartment. Has had difficulty in obtaining transportation to the clinic.  Review of Systems     Objective:   Physical Exam    Carotids without bruits Car: rrr-I did not detect a diastolic murmur on exam today  Vitals noted    Assessment & Plan:   HTN On review of flow sheet not at goal consistently Change to chlorthalidone 15 mg a day and losartin 50 mg May need to titrate up to 100 mg a day Check CMET and cbc, b12 level FU for CPE in 8 weeks

## 2010-11-07 NOTE — Patient Instructions (Signed)
GO TO THE DRUG STORE TO GET YOUR NEW MEDICINES  MAKE SURE YOU TAKE YOUR PILLS WITH YOU TO THE DRUG STORE AND THEY WILL HELP YOU WITH THAT

## 2010-11-08 LAB — CBC
MCH: 30.5 pg (ref 26.0–34.0)
MCHC: 32.7 g/dL (ref 30.0–36.0)
Platelets: 200 10*3/uL (ref 150–400)
WBC: 5.9 10*3/uL (ref 4.0–10.5)

## 2010-11-08 LAB — COMPREHENSIVE METABOLIC PANEL
ALT: 8 U/L (ref 0–35)
BUN: 25 mg/dL — ABNORMAL HIGH (ref 6–23)
CO2: 20 mEq/L (ref 19–32)
Creat: 1.06 mg/dL (ref 0.40–1.20)
Total Bilirubin: 0.6 mg/dL (ref 0.3–1.2)

## 2010-11-08 LAB — VITAMIN B12: Vitamin B-12: 298 pg/mL (ref 211–911)

## 2010-11-13 NOTE — Assessment & Plan Note (Signed)
[facility]                         GASTROENTEROLOGY OFFICE NOTE   Jane, [name]                      MRN:          [id number]  DATE:07/31/2007                            DOB:          11/17/1928    Jane [name] is referred because of intermittent regurgitation of acid and  blood in her mouth.   Jane [name] is an elderly, 77 year old white female that I have seen for  several years because of acid reflux and diverticulosis coli.  Her last  endoscopy and colonoscopy were seven years ago.  She has chronic  functional constipation and recurrent hemorrhoidal bleeding.  She has  been on chronic PPI therapy and is followed by Dr. Julaine Fusi.   She has recently had increased regurgitation and has had dried heme in  her throat.  She has been in the emergency room, has had an ENT  consultation, which apparently had been normal.  CBC on October 15 this  past year showed a hemoglobin of 10.4, but otherwise normal CBC.   She was apparently placed on iron therapy and takes ferrous gluconate  daily, in addition to her aspirin 81 mg a day, omeprazole 20 mg a day.   She has chronic mild constipation with gas and bloating, but this is not  a problem at this time.  Her appetite is good and her weight is stable.  She denies associated dysphagia.  She has had no anorexia or weight-  loss.   She is on multiple medications, listed and reviewed in her chart,  including   1. Atenolol 100 mg twice a day.  2. Pravachol 40 mg a day.  3. A daily stool softener.   ALLERGIES:  She has a history of SULFA allergy.   EXAMINATION:  She is an elderly-appearing black female, in no acute  distress, appearing older than her stated age.  She is 5 feet tall and weighs 154 pounds.  Blood pressure is 140/74,  pulse was 68 and regular.  I  could not appreciate stigmata of chronic liver disease or  thyromegaly.  Examination of the oropharynx was unremarkable.  Her chest was clear and  she appeared to be in a regular rhythm without  murmurs, gallops or rubs.  I could not appreciate hepatosplenomegaly, abdominal masses or  tenderness.  Bowel sounds were normal.  MENTAL STATUS:  Clear.  RECTAL EXAM:  Deferred.  There is no peripheral edema, phlebitis or swollen joints.   ASSESSMENT:  1. Chronic GERD with recent history of hematemesis.  Rule out      underlying esophageal malignancy.  The patient relates that she      used to be a heavy smoker and drinker, but quit some 25-30 years      ago.  2. Constipation-predominant irritable bowel syndrome with      diverticulosis.  3. Probable mixed anemia.  4. Hypertensive cardiovascular disease.  5. History of hyperlipidemia with previous CVA, on chronic aspirin      therapy.   RECOMMENDATIONS:  1. Outpatient endoscopy and colonoscopy at her convenience.  2. Strict reflux regimen and I have increased omeprazole to 20  mg      twice a day before meals.  3. Carafate suspension 2 tsp an hour after meals and at bedtime.  4. Check followup labs and anemia profile.  5. Continue her other medications per Dr. Phillips Odor.     Vania Rea. Jarold Motto, MD, Caleen Essex, FAGA  Electronically Signed    DRP/MedQ  DD: 07/31/2007  DT: 07/31/2007  Job #: [id number]   cc:   [name], D.O.

## 2010-11-13 NOTE — Op Note (Signed)
NAME:  Jane [name], Jane [name]               ACCOUNT NO.:  [id number]   MEDICAL RECORD NO.:  [id number]          PATIENT TYPE:  AMB   LOCATION:  CARD                         FACILITY:  [facility]   PHYSICIAN:  Barbaraann Share, MD,FCCPDATE OF BIRTH:  January 25, 1931   DATE OF PROCEDURE:  11/04/2007  DATE OF DISCHARGE:                               OPERATIVE REPORT   PROCEDURE:  Flexible fiberoptic bronchoscopy.   OPERATOR:  Barbaraann Share, MD,FCCP   INDICATIONS:  Hemoptysis of unknown origin.   ANESTHESIA:  Demerol 50 mg IV, Versed 5 mg IV, topical 1% lidocaine to  vocal cords and airways during the procedure.   DESCRIPTION:  After obtaining informed consent and under close  cardiopulmonary monitoring the above preop anesthesia was given and the  fiberoptic scope was passed through the right nare and into the  posterior pharynx where there was no obvious lesions or bleeding site  noted throughout.  There were prominent vessels noted along the base of  the epiglottis with punctate vessels and one ulcer noted posteriorly on  the base of tongue.  The scope was then passed to the level of the vocal  cords which appeared to be within normal limits and moved bilaterally.  There were no obvious ulcerations.  The scope was then passed into the  trachea where it was examined along its entire length down to the level  of the carina all of which was normal.  The left tracheobronchial tree  was examined serially to the subsegmental level with no endobronchial  abnormality being found.  The right tracheobronchial tree was then  examined to the subsegmental level with no abnormalities noted in the  right upper lobe or right lower lobe basilar segments.  The right middle  lobe was also unremarkable.   When entering the superior segment of the right lower lobe there  appeared to be a small amount of blood in a lateral subsegment.  This  was then entered and the scope was passed as far as it could go, but it  was  difficult to see because of oozing.  The airway was subsequently  dilated with a saline wash, and there appeared to the either a foreign  body or some type of abnormal growth distally.  It was very difficult to  get to this area because of the oozing and its distal nature.  I was  unable to pass forceps or a brush into this area because of the flexion  of the scope.  Large volume BAL/wash was done from this area and sent  for the appropriate bacteriologic and cytologic evaluation.  Overall the  patient tolerated the procedure quite well and there were no  complications.      Barbaraann Share, MD,FCCP  Electronically Signed     KMC/MEDQ  D:  11/04/2007  T:  11/04/2007  Job:  [id number]

## 2010-11-13 NOTE — Letter (Signed)
January 13, 2008   Barbaraann Share, MD, FCCP  [address]  [place], [place] [address]   Re:  Jane [name], Jane [name]               DOB:  13-Apr-1929   Dear Mellody Dance,   I saw the patient back today.  She has had no more hemoptysis for the  last 2 months.  We reviewed her CT scan and saw no nodules or effusions.  I did not see any areas of calcium near the superior segment of the  right lower lobe.  I feel, she has had no more hemoptysis may be I can  just follow her.  If she does have another episode of hemoptysis, I have  explained to her that I would recommend resection, particularly if we  can prove that it is from the superior segment of the right lower lobe.  Pulmonary function tests showed an FVC of 1.62, 78% predicted and FEV-1  of 1.4, 100% predicted, and a diffusion capacity was down at 44%.  I  told the patient to let us know immediately if she has any more  symptoms, and I will be happy to see her again.   Ines Bloomer, M.D.  Electronically Signed   DPB/MEDQ  D:  01/13/2008  T:  01/14/2008  Job:  [id number]

## 2010-11-13 NOTE — Assessment & Plan Note (Signed)
[facility]                         GASTROENTEROLOGY OFFICE NOTE   Jane [name] [name]                      MRN:          [id number]  DATE:10/02/2007                            DOB:          [birth year]    Jane [name] continues to complain of some throat clearing, but denies  significant reflux symptoms or other gastrointestinal problems.  Because  of insurance purposes, she could not take Aciphex, but is on omeprazole  20 mg twice a day before breakfast and supper.  Her endoscopy on  August 12, 2007 showed a prominent hiatal hernia, but no evidence of  upper GI bleeding.  There was a small hyperplastic polyp that was  biopsied.  There was no evidence of adenomatous tissue.  Also, at the  time of her recent exam, she underwent colonoscopy on August 12, 2007.  It was an extremely difficult exam because of a very long and redundant  colon.  However, there were no definite abnormalities, except for some  small hyperplastic polyps, and diverticulosis.  Lab data from January  2009 showed a normal metabolic profile, and a CBC with a hemoglobin of  11.  Liver function tests were normal.  Ferritin level was normal at 87.  Floate level was 16.  B-12 level was greater than 1,500.   The patient is awake, alert, and in no acute distress.  She weighs 157 pounds, which is her normal weight, and blood pressure  was 130/64.  Pulse was 64 and regular.  General physical exam was not reviewed.   ASSESSMENT:  1. Chronic constipation and recurrent hemorrhoidal bleeding with known      diverticulosis.  2. Chronic gastroesophageal reflux disease.  Doing well on proton pump      inhibitor therapy.  3. Globus sensation in throat related to number 2.  4. Anemia of chronic disease.  5. Hypertensive cardiovascular disease with previous cerebral vascular      accident on chronic aspirin therapy and multiple cardiac      medications listed and reviewed in her chart.   RECOMMENDATIONS:  1. Continue reflux regime and twice-a-day omeprazole.  2. I do not think the patient needs to continue iron therapy at this      time, but it should probably be checked again in 2 to 3 months'      time per Dr. Phillips Odor.  3. Continue other multiple medications listed above.  4. High-fiber diet with fiber supplements as tolerated.  Liberal p.o.      fluids, and p.r.n. stool softeners.  The patient may need p.r.n.      MiraLax at bedtime for constipation, but I will leave this to      Primary Care.     Jane [name] [name]. Jane [name] Motto, MD, Caleen Essex, FAGA  Electronically Signed   DRP/MedQ  DD: 10/02/2007  DT: 10/02/2007  Job #: [id number]   cc:   Jane [name] [name], D.O.

## 2010-11-13 NOTE — Letter (Signed)
December 23, 2007   Barbaraann Share, MD,FCCP  [address]  [place], [place] [address]   Re:  Jane [name], Jane [name]               DOB:  October 24, 1928   Dear Mellody Dance,   I appreciate the opportunity of seeing the patient.  She is a 77-year-  old [place] American female with multiple episodes of hemoptysis and  underwent a bronchoscopy in which it was thought that it was coming from  the right lower lobe.  Cytologies were negative.  It was thought to be  in superior segment of the right lower lobe as it might be from a  possible bronchial.  A CT scan done in September of last year was  unremarkable but did show some calcium in this area.  She had a previous  upper endoscopy which was negative and a negative otolaryngology exam.  She has a long history of smoking, and she had no fever or chills.  No  excessive sputum.  Since her bronchoscopy, she has had no further  hemoptysis.   ALLERGIES:  She has allergy to Sulfa.   PAST MEDICAL HISTORY:  Significant for osteopenia, balance problems,  constipation, anemia, osteoarthritis, hypertension, hyperlipidemia,  vertigo, diverticulosis, anxiety, and vitamin B12 deficiency.   MEDICATIONS:  1. Darvocet-N 100.  2. Aspirin.  3. Metoprolol 100 mg twice a day.  4. Lotensin/hydrochlorothiazide 20/25 daily.  5. __________ 7 mcg a day.  6. Prilosec 20 mg a day.   FAMILY HISTORY:  Noncontributory.   SOCIAL HISTORY:  She is widowed.  She quit smoking in 1984.  Does not  drink alcohol on a regular basis.   REVIEW OF SYSTEMS:  She is 5 foot.  GENERAL:  Weight has been stable.  CARDIAC:  No angina or atrial fibrillation.  PULMONARY:  See history of  present illness.  GI:  She has reflux and constipation.  GU:  No dysuria  or frequent urination.  VASCULAR:  No claudication, DVT, or TIAs.  NEUROLOGICAL:  She has dizziness, no headaches, seizures or blackouts.  MUSCULOSKELETAL:  Severe arthritis.  PSYCHIATRIC:  Depression.  ENT:  No  change in her eyesight or  hearing.  HEMATOLOGIC:  Problems with iron-  deficiency anemia.  She has no problems with clotting or bleeding  disorder.   PHYSICAL EXAMINATION:  GENERAL:  She is a frail-appearing African  American female in no acute distress.  VITAL SIGNS:  Her blood pressure was 177/76, pulse 80, respirations 18,  and sats were 95%.  HEAD, EYES, EARS, NOSE AND THROAT:  Unremarkable.  NECK:  Supple without thyromegaly, supraclavicular, or axillary  adenopathy.  CHEST:  Clear to auscultation and percussion.  HEART:  Regular sinus rhythm.  No murmurs.  ABDOMEN:  Soft.  There is no hepatosplenomegaly.  Pulses are 2+.  There  is no clubbing or edema.  NEUROLOGICAL:  She is oriented x3.  Sensory and motor intact.  Cranial  nerves are intact.   IMPRESSION:  I feel that they are very difficult problems, I explained  to her.  Since she had no more hemoptysis, I will discontinue to watch  at the present time but we will see her back again in 3 weeks with a CT  scan and a full set of pulmonary function test.  If she continues to  have hemoptysis, she will require a second bronchoscopy, I believe it is  to cover the superior segment of the right lower lobe, then she  will  definitively need to have a right lower lobe superior segmentectomy and  a possible right lower lobectomy.  I appreciate the opportunity of  seeing this patient.   Ines Bloomer, M.D.  Electronically Signed   DPB/MEDQ  D:  12/23/2007  T:  12/24/2007  Job:  [id number]

## 2010-11-16 NOTE — Op Note (Signed)
[place]  Patient:    Jane [name], Jane [name]                      MRN: [id number] Proc. Date: 03/28/00 Adm. Date:  [id number] Attending:  Milly Jakob CC:         Harvie Junior, M.D.   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  POSTOPERATIVE DIAGNOSIS:  Cubital tunnel syndrome and carpal tunnel syndrome.  OPERATION PERFORMED: 1. Release of volar carpal ligament, carpal tunnel release. 2. Release of ulnar nerve in the cubital tunnel with subcutaneous    transposition.  SURGEON:  Harvie Junior, M.D.  ASSISTANTOrma Flaming  ANESTHESIA:  General.  INDICATIONS FOR PROCEDURE:  The patient is a 77 year old female with a long history of having had bilateral carpal tunnel syndrome.  She ultimately had had an EMG done years ago which showed she had bilateral carpal tunnel syndrome.  We had seen her and released her right carpal tunnel.  She had excellent relief of her night numbness and tingling.  Still had a little bit of residual pain.  We had followed her longterm for left-sided symptoms.  We ultimately because it had spread to all of her fingers on the left side got a repeat EMG which showed that she had both cubital tunnel and carpal tunnel syndrome.  We had done some blood work on her and showed that she had some thyroid issues.  This was ultimately evaluated by Dr. Linton Rump and she was because of persistent symptoms ultimately came back requesting to have her nerves released and she was brought to the operating room for this procedure.  DESCRIPTION OF PROCEDURE:  The patient was brought to the operating room and after adequate anesthesia was obtained with a general anesthetic, the patient was placed supine on the operating table.  The left arm was then prepped and draped in the usual sterile fashion.  Following Esmarch exsanguination of the extremity, a blood pressure tourniquet was inflated  to 250 mmHg.  Following this, an incision was made to and dissection carried down to the area of the volar carpal ligament.  It was identified and sharply divided both proximally and distally.  A gloved finger could be placed in the wound proximally and distally.  The median nerve was identified and noted to have no significant evidence of adhesion or compression.  At this point attention was then turned towards ____________ the wound was copiously irrigated and then suctioned dry and then closed with a combination of 4-0 interrupted and running Vicryl.  At this point attention was turned to the elbow where curvilinear incision was made over the lateral aspect of the elbow.  Subcutaneous tissues were dissected down to the level of the fascia.  The ulnar nerve was identified and then dissected free both proximally and distally, care being taken to dissect well into the flexor carpi ulnaris two heads.  There was thick fibrous band covering in this area.  The nerve was released in the cubital tunnel and then well proximally up to the medial intermuscular septum.  A small portion of the medial intermuscular septum was removed.  At this point the nerve was freed up and felt that subcutaneous transposition would be the most efficient way of allowing the nerve to seek a new level.  The interval between the flexor carpi ulnaris two heads was closed proximally and slightly also at the elbow.  A stitch  was laid between the fascia of the skin and the medial epicondyle to allow for a new tract of the nerve and the arm was put through a full range of motion and no evidence of ____________ compression at all.  At this point the wound was copiously irrigated and suctioned dry.  The skin was then closed with a combination of 2-0 Vicryl and a nylon suture.  A sterile compressive dressing was applied.  The patient was placed into an elbow plaster.  She was then taken to the recovery room where she was noted  to be in satisfactory condition.  ESTIMATED BLOOD LOSS:  None. DD:  03/28/00 TD:  03/28/00 Job: [id number]

## 2010-12-02 ENCOUNTER — Emergency Department (HOSPITAL_COMMUNITY): Payer: Medicare Other

## 2010-12-02 ENCOUNTER — Emergency Department (HOSPITAL_COMMUNITY)
Admission: EM | Admit: 2010-12-02 | Discharge: 2010-12-02 | Disposition: A | Discharge status: Home / Self Care | Payer: Medicare Other | Attending: Emergency Medicine | Admitting: Emergency Medicine

## 2010-12-02 DIAGNOSIS — R51 Headache: Secondary | ICD-10-CM | POA: Insufficient documentation

## 2010-12-02 DIAGNOSIS — Z7982 Long term (current) use of aspirin: Secondary | ICD-10-CM | POA: Insufficient documentation

## 2010-12-02 DIAGNOSIS — Z79899 Other long term (current) drug therapy: Secondary | ICD-10-CM | POA: Insufficient documentation

## 2010-12-02 LAB — BASIC METABOLIC PANEL
BUN: 12 mg/dL (ref 6–23)
Chloride: 107 mEq/L (ref 96–112)
Creatinine, Ser: 0.75 mg/dL (ref 0.4–1.2)
GFR calc Af Amer: >60 mL/min (ref >60)
GFR calc non Af Amer: >60 mL/min (ref >60)

## 2010-12-02 LAB — DIFFERENTIAL
Basophils Absolute: 0 10*3/uL (ref 0.0–0.1)
Eosinophils Absolute: 0.1 10*3/uL (ref 0.0–0.7)
Lymphocytes Relative: 26 % (ref 12–46)
Lymphs Abs: 1.1 10*3/uL (ref 0.7–4.0)
Neutrophils Relative %: 64 % (ref 43–77)

## 2010-12-02 LAB — CBC
HCT: 33.8 % — ABNORMAL LOW (ref 36.0–46.0)
MCV: 92.1 fL (ref 78.0–100.0)
Platelets: 173 10*3/uL (ref 150–400)
RBC: 3.67 MIL/uL — ABNORMAL LOW (ref 3.87–5.11)
WBC: 4.1 10*3/uL (ref 4.0–10.5)

## 2010-12-02 LAB — APTT: aPTT: 36 seconds (ref 24–37)

## 2010-12-02 LAB — PROTIME-INR: INR: 1.1 (ref 0.00–1.49)

## 2010-12-03 LAB — DIFFERENTIAL
Basophils Absolute: 0 10*3/uL (ref 0.0–0.1)
Basophils Relative: 0 % (ref 0–1)
Eosinophils Relative: 3 % (ref 0–5)
Monocytes Absolute: 0.3 10*3/uL (ref 0.1–1.0)

## 2010-12-03 LAB — BASIC METABOLIC PANEL
CO2: 25 mEq/L (ref 19–32)
Calcium: 9.6 mg/dL (ref 8.4–10.5)
Creatinine, Ser: 0.73 mg/dL (ref 0.4–1.2)
GFR calc Af Amer: >60 mL/min (ref >60)
GFR calc non Af Amer: >60 mL/min (ref >60)
Sodium: 139 mEq/L (ref 135–145)

## 2010-12-03 LAB — CBC
HCT: 35.9 % — ABNORMAL LOW (ref 36.0–46.0)
MCHC: 34 g/dL (ref 30.0–36.0)
MCV: 92.1 fL (ref 78.0–100.0)
RDW: 13.1 % (ref 11.5–15.5)
WBC: 3.5 10*3/uL — ABNORMAL LOW (ref 4.0–10.5)

## 2010-12-05 ENCOUNTER — Emergency Department (HOSPITAL_COMMUNITY)
Admission: EM | Admit: 2010-12-05 | Discharge: 2010-12-05 | Disposition: A | Discharge status: Home / Self Care | Payer: Medicare Other | Attending: Emergency Medicine | Admitting: Emergency Medicine

## 2010-12-05 ENCOUNTER — Emergency Department (HOSPITAL_COMMUNITY): Payer: Medicare Other

## 2010-12-05 DIAGNOSIS — R51 Headache: Secondary | ICD-10-CM | POA: Insufficient documentation

## 2010-12-05 DIAGNOSIS — K219 Gastro-esophageal reflux disease without esophagitis: Secondary | ICD-10-CM | POA: Insufficient documentation

## 2010-12-05 DIAGNOSIS — I10 Essential (primary) hypertension: Secondary | ICD-10-CM | POA: Insufficient documentation

## 2010-12-05 DIAGNOSIS — E785 Hyperlipidemia, unspecified: Secondary | ICD-10-CM | POA: Insufficient documentation

## 2010-12-05 DIAGNOSIS — Z8673 Personal history of transient ischemic attack (TIA), and cerebral infarction without residual deficits: Secondary | ICD-10-CM | POA: Insufficient documentation

## 2010-12-05 DIAGNOSIS — R0602 Shortness of breath: Secondary | ICD-10-CM | POA: Insufficient documentation

## 2010-12-12 ENCOUNTER — Telehealth (INDEPENDENT_AMBULATORY_CARE_PROVIDER_SITE_OTHER): Payer: Medicare Other | Admitting: *Deleted

## 2010-12-12 DIAGNOSIS — I10 Essential (primary) hypertension: Secondary | ICD-10-CM

## 2010-12-12 NOTE — Telephone Encounter (Signed)
Pharmacy sends a note stating hygroten 15mg  is no longer made, 25 mg is made if you want to consider that, if so, please send in as new script electronically and add to med list,   Thanks,h.

## 2010-12-17 MED ORDER — CHLORTHALIDONE 25 MG PO TABS: 25.0000 mg | ORAL_TABLET | Freq: Every day | ORAL | Status: DC

## 2010-12-17 NOTE — Telephone Encounter (Signed)
Changed to 25mg  tab.

## 2010-12-19 ENCOUNTER — Emergency Department (HOSPITAL_COMMUNITY): Payer: Medicare Other

## 2010-12-19 ENCOUNTER — Emergency Department (HOSPITAL_COMMUNITY)
Admission: EM | Admit: 2010-12-19 | Discharge: 2010-12-19 | Disposition: A | Discharge status: Home / Self Care | Payer: Medicare Other | Attending: Emergency Medicine | Admitting: Emergency Medicine

## 2010-12-19 DIAGNOSIS — R51 Headache: Secondary | ICD-10-CM | POA: Insufficient documentation

## 2010-12-19 DIAGNOSIS — I10 Essential (primary) hypertension: Secondary | ICD-10-CM | POA: Insufficient documentation

## 2010-12-19 DIAGNOSIS — Z8673 Personal history of transient ischemic attack (TIA), and cerebral infarction without residual deficits: Secondary | ICD-10-CM | POA: Insufficient documentation

## 2010-12-19 LAB — POCT I-STAT, CHEM 8
BUN: 14 mg/dL (ref 6–23)
Calcium, Ion: 1.14 mmol/L (ref 1.12–1.32)
Creatinine, Ser: 0.9 mg/dL (ref 0.50–1.10)
TCO2: 23 mmol/L (ref 0–100)

## 2010-12-24 ENCOUNTER — Emergency Department (HOSPITAL_COMMUNITY)
Admission: EM | Admit: 2010-12-24 | Discharge: 2010-12-24 | Disposition: A | Discharge status: Home / Self Care | Payer: Medicare Other | Attending: Emergency Medicine | Admitting: Emergency Medicine

## 2010-12-24 ENCOUNTER — Telehealth: Payer: Self-pay | Admitting: *Deleted

## 2010-12-24 DIAGNOSIS — R51 Headache: Secondary | ICD-10-CM | POA: Insufficient documentation

## 2010-12-24 DIAGNOSIS — I491 Atrial premature depolarization: Secondary | ICD-10-CM | POA: Insufficient documentation

## 2010-12-24 DIAGNOSIS — I10 Essential (primary) hypertension: Secondary | ICD-10-CM | POA: Insufficient documentation

## 2010-12-24 DIAGNOSIS — Z8673 Personal history of transient ischemic attack (TIA), and cerebral infarction without residual deficits: Secondary | ICD-10-CM | POA: Insufficient documentation

## 2010-12-24 LAB — URINALYSIS, ROUTINE W REFLEX MICROSCOPIC
Glucose, UA: NEGATIVE mg/dL
Hgb urine dipstick: NEGATIVE
Ketones, ur: NEGATIVE mg/dL
Protein, ur: 100 mg/dL — AB

## 2010-12-24 LAB — URINE MICROSCOPIC-ADD ON

## 2010-12-24 NOTE — Telephone Encounter (Signed)
Calls for ED f/u appt, several trips to ED for h/a's, appt given as desired for 7/3 at 1445

## 2010-12-28 LAB — URINE CULTURE

## 2011-01-01 ENCOUNTER — Encounter: Payer: Medicare Other | Admitting: Internal Medicine

## 2011-01-03 ENCOUNTER — Ambulatory Visit: Payer: Medicare Other | Admitting: Internal Medicine

## 2011-03-26 LAB — CBC
HCT: 28.9 — ABNORMAL LOW
Hemoglobin: 10.1 — ABNORMAL LOW
MCHC: 34.9
MCV: 95.4
RBC: 3.03 — ABNORMAL LOW

## 2011-03-26 LAB — POCT I-STAT, CHEM 8
BUN: 23
Calcium, Ion: 1.23
Chloride: 106
Creatinine, Ser: 1.2
Glucose, Bld: 92

## 2011-03-26 LAB — DIFFERENTIAL
Basophils Relative: 0
Eosinophils Absolute: 0
Eosinophils Relative: 0
Monocytes Absolute: 0.3
Monocytes Relative: 10
Neutrophils Relative %: 59

## 2011-04-10 LAB — BASIC METABOLIC PANEL
BUN: 26 — ABNORMAL HIGH
Calcium: 9
Chloride: 107
Creatinine, Ser: 1.09
GFR calc non Af Amer: 49 — ABNORMAL LOW

## 2011-04-10 LAB — DIFFERENTIAL
Eosinophils Absolute: 0
Lymphs Abs: 1.4
Neutrophils Relative %: 47

## 2011-04-10 LAB — D-DIMER, QUANTITATIVE: D-Dimer, Quant: 1.18 — ABNORMAL HIGH

## 2011-04-10 LAB — CBC
MCV: 93.7
Platelets: 213
WBC: 3.5 — ABNORMAL LOW

## 2011-04-10 LAB — PROTIME-INR: Prothrombin Time: 13.6

## 2011-09-11 ENCOUNTER — Encounter: Payer: Medicare Other | Admitting: Internal Medicine

## 2011-11-27 ENCOUNTER — Other Ambulatory Visit: Payer: Self-pay | Admitting: *Deleted

## 2011-11-27 MED ORDER — OMEPRAZOLE 20 MG PO CPDR: 20.0000 mg | DELAYED_RELEASE_CAPSULE | Freq: Two times a day (BID) | ORAL | Status: DC

## 2011-11-27 MED ORDER — METOPROLOL SUCCINATE ER 200 MG PO TB24: 200.0000 mg | ORAL_TABLET | Freq: Every day | ORAL | Status: DC

## 2011-11-30 ENCOUNTER — Other Ambulatory Visit: Payer: Self-pay | Admitting: Internal Medicine

## 2012-01-06 ENCOUNTER — Other Ambulatory Visit: Payer: Self-pay | Admitting: *Deleted

## 2012-01-06 NOTE — Telephone Encounter (Signed)
Pt has an appt 02/10/12 with Dr Bosie Clos.

## 2012-01-09 MED ORDER — LOSARTAN POTASSIUM 50 MG PO TABS: 50.0000 mg | ORAL_TABLET | Freq: Every day | ORAL | Status: DC

## 2012-01-09 MED ORDER — FERROUS GLUCONATE 240 (27 FE) MG PO TABS: 240.0000 mg | ORAL_TABLET | Freq: Every day | ORAL | Status: DC

## 2012-01-09 NOTE — Telephone Encounter (Signed)
Pt must keep appt to get more refills from [facility]

## 2012-01-09 NOTE — Telephone Encounter (Signed)
Pt informed

## 2012-02-10 ENCOUNTER — Encounter: Payer: Medicare Other | Admitting: Internal Medicine

## 2012-02-17 ENCOUNTER — Encounter: Payer: Medicare Other | Admitting: Internal Medicine

## 2012-02-24 ENCOUNTER — Encounter: Payer: Medicare Other | Admitting: Internal Medicine

## 2012-03-03 ENCOUNTER — Other Ambulatory Visit: Payer: Self-pay | Admitting: Internal Medicine

## 2012-03-09 ENCOUNTER — Encounter: Payer: Medicare Other | Admitting: Internal Medicine

## 2012-03-11 ENCOUNTER — Other Ambulatory Visit: Payer: Self-pay | Admitting: Internal Medicine

## 2012-03-23 ENCOUNTER — Encounter: Payer: Medicare Other | Admitting: Internal Medicine

## 2012-04-10 ENCOUNTER — Encounter: Payer: Self-pay | Admitting: Internal Medicine

## 2012-04-29 ENCOUNTER — Other Ambulatory Visit: Payer: Self-pay | Admitting: *Deleted

## 2012-04-30 ENCOUNTER — Other Ambulatory Visit: Payer: Self-pay | Admitting: *Deleted

## 2012-04-30 MED ORDER — LOSARTAN POTASSIUM 50 MG PO TABS: 50.0000 mg | ORAL_TABLET | Freq: Every day | ORAL | Status: DC

## 2012-04-30 MED ORDER — FERROUS GLUCONATE 256 (28 FE) MG PO TABS: 256.0000 mg | ORAL_TABLET | Freq: Every day | ORAL | Status: DC

## 2012-05-12 ENCOUNTER — Encounter: Payer: Self-pay | Admitting: Internal Medicine

## 2012-05-12 ENCOUNTER — Ambulatory Visit: Payer: Medicare Other | Admitting: Internal Medicine

## 2012-07-10 ENCOUNTER — Other Ambulatory Visit: Payer: Self-pay | Admitting: *Deleted

## 2012-07-11 MED ORDER — FERROUS GLUCONATE 256 (28 FE) MG PO TABS: 256.0000 mg | ORAL_TABLET | Freq: Every day | ORAL | Status: DC

## 2012-09-16 ENCOUNTER — Ambulatory Visit: Payer: Medicare Other | Admitting: Internal Medicine

## 2012-09-18 ENCOUNTER — Encounter: Payer: Self-pay | Admitting: Internal Medicine

## 2012-09-18 ENCOUNTER — Ambulatory Visit (INDEPENDENT_AMBULATORY_CARE_PROVIDER_SITE_OTHER): Payer: Medicare Other | Admitting: Internal Medicine

## 2012-09-18 VITALS — BP 148/62 | HR 69 | Temp 97.1°F | Wt 180.1 lb

## 2012-09-18 DIAGNOSIS — Z23 Encounter for immunization: Secondary | ICD-10-CM | POA: Diagnosis not present

## 2012-09-18 DIAGNOSIS — Z0189 Encounter for other specified special examinations: Secondary | ICD-10-CM | POA: Diagnosis not present

## 2012-09-18 DIAGNOSIS — Z008 Encounter for other general examination: Secondary | ICD-10-CM

## 2012-09-18 DIAGNOSIS — I739 Peripheral vascular disease, unspecified: Secondary | ICD-10-CM | POA: Insufficient documentation

## 2012-09-18 DIAGNOSIS — H44009 Unspecified purulent endophthalmitis, unspecified eye: Secondary | ICD-10-CM

## 2012-09-18 DIAGNOSIS — H44002 Unspecified purulent endophthalmitis, left eye: Secondary | ICD-10-CM

## 2012-09-18 DIAGNOSIS — Z131 Encounter for screening for diabetes mellitus: Secondary | ICD-10-CM | POA: Diagnosis not present

## 2012-09-18 DIAGNOSIS — B351 Tinea unguium: Secondary | ICD-10-CM | POA: Diagnosis not present

## 2012-09-18 DIAGNOSIS — Z0000 Encounter for general adult medical examination without abnormal findings: Secondary | ICD-10-CM

## 2012-09-18 DIAGNOSIS — I872 Venous insufficiency (chronic) (peripheral): Secondary | ICD-10-CM

## 2012-09-18 DIAGNOSIS — I10 Essential (primary) hypertension: Secondary | ICD-10-CM | POA: Diagnosis not present

## 2012-09-18 LAB — COMPREHENSIVE METABOLIC PANEL
AST: 18 U/L (ref 0–37)
Albumin: 4.2 g/dL (ref 3.5–5.2)
Alkaline Phosphatase: 83 U/L (ref 39–117)
BUN: 28 mg/dL — ABNORMAL HIGH (ref 6–23)
Creat: 1.1 mg/dL (ref 0.50–1.10)
Potassium: 3.5 mEq/L (ref 3.5–5.3)

## 2012-09-18 LAB — CBC WITH DIFFERENTIAL/PLATELET
Basophils Relative: 0 % (ref 0–1)
Eosinophils Absolute: 0.1 10*3/uL (ref 0.0–0.7)
Eosinophils Relative: 1 % (ref 0–5)
HCT: 33.8 % — ABNORMAL LOW (ref 36.0–46.0)
Hemoglobin: 11.7 g/dL — ABNORMAL LOW (ref 12.0–15.0)
MCH: 31.7 pg (ref 26.0–34.0)
MCHC: 34.6 g/dL (ref 30.0–36.0)
MCV: 91.6 fL (ref 78.0–100.0)
Monocytes Absolute: 0.5 10*3/uL (ref 0.1–1.0)
Monocytes Relative: 9 % (ref 3–12)

## 2012-09-18 MED ORDER — FERROUS GLUCONATE 256 (28 FE) MG PO TABS: 256.0000 mg | ORAL_TABLET | Freq: Every day | ORAL | Status: DC

## 2012-09-18 MED ORDER — ERYTHROMYCIN 5 MG/GM OP OINT: TOPICAL_OINTMENT | OPHTHALMIC | Status: DC

## 2012-09-18 MED ORDER — LOSARTAN POTASSIUM 50 MG PO TABS: 50.0000 mg | ORAL_TABLET | Freq: Every day | ORAL | Status: DC

## 2012-09-18 NOTE — Assessment & Plan Note (Signed)
x2 months, lower eye lid erythema and edema.  No focal area of pus drainage or visible head.  Non-tender to palpation, no visible oozing.  Denies itching, reports eye lids stuck in the morning.  Mild improvement in redness with otc visine which is all she has tried.  Does not recall how infection started.  No visible infection or complaints in right eye. Denies change in vision but has baseline blurry vision for which she claims she was supposed to go to the eye doctor but has not.  No pain on eye movement.    Unknown etiology: ?chalazion of lower lid vs. blepharitis?  -erythromycin drops to left eye x5-7 days -urgent opthalmology consult -chronic no show that needs close follow up

## 2012-09-18 NOTE — Progress Notes (Signed)
Subjective:   Patient ID: Jane [name] female   DOB: [birth year] 79 y.o.   MRN: [id number]  HPI: Jane [name] is a 79 y.o. white female with PMH of uncontrolled HTN, anemia, diverticulosis, and mild aortic insufficiency presenting to clinic today for acute visit of BP check and eye infection.  Jane [name] is a chronic no show patient and her last visit in [facility] was in 2012.    She presents today wanting to get her BP checked because she says her friend checked it some time ago and noticed it was elevated.    She also has a visible left lower eye lid infection/swelling and redness x2 months. She claims she does not remember how it started and she has tried otc visine but has not noticed much improvement.  She denies any pain or drainage but reports occasional tearing from both eyes.  She denies any change in her vision but has baseline blurry vision and she was supposed to see an eye doctor but never did.  She denies any pain on movement of eye and claims when she wakes up in the morning, sometimes her eyelids are stuck together on the left eye.  She denies any headaches, chest pain, N/V/D, fever, chills, shortness of breath, abdominal pain, or any urinary complaints at this time.    Jane [name] also reports rubbing lots of bengay on her lower legs some time ago, she is unable to quantify when, but says she rubbed so much that she burned her skin.  Today, her b/l lower extremities are darkened discoloration below the knees, dry skin, areas of pink healing skin, multiple patches of excoriation no visible open ulcers.  She is able to walk but is currently sitting in a wheel chair and thinks she may benefit from a walker.  She doesn't like using a cane.    She claims to be compliant with her blood pressure medications and admits that she needs to keep her appointments in the future.    Past Medical History  Diagnosis Date  . Anemia   . Anxiety   . Arthritis   . GERD (gastroesophageal reflux  disease)   . Hyperlipidemia   . Hypertension   . Stroke   . Vitamin D deficiency   . Colon polyp     2009 colonoscopy  . Hiatal hernia   . Diverticulosis   . Aortic insufficiency     mild   Current Outpatient Prescriptions  Medication Sig Dispense Refill  . chlorthalidone (HYGROTON) 25 MG tablet take 1 tablet by mouth once daily  30 tablet  11  . Ferrous Gluconate (FERATE) 256 (28 FE) MG TABS Take 256 mg by mouth daily.  30 tablet  0  . losartan (COZAAR) 50 MG tablet Take 1 tablet (50 mg total) by mouth daily.  90 tablet  1  . metoprolol (TOPROL XL) 200 MG 24 hr tablet Take 1 tablet (200 mg total) by mouth daily.  30 tablet  11  . omeprazole (PRILOSEC) 20 MG capsule Take 1 capsule (20 mg total) by mouth 2 (two) times daily.  60 capsule  11   No current facility-administered medications for this visit.   History reviewed. No pertinent family history. History   Social History  . Marital Status: Widowed    Spouse Name: N/A    Number of Children: N/A  . Years of Education: N/A   Social History Main Topics  . Smoking status: Former Smoker    Types: Cigarettes  Quit date: 07/01/1984  . Smokeless tobacco: None  . Alcohol Use: No  . Drug Use: No  . Sexually Active: No   Other Topics Concern  . None   Social History Narrative  . None   Review of Systems: Constitutional: Denies fever, chills, diaphoresis, appetite change and fatigue.  HEENT: L lower eye lid redness and swelling.  Denies photophobia, eye pain, redness, hearing loss, ear pain, congestion, sore throat, rhinorrhea, sneezing, mouth sores, trouble swallowing, neck pain, neck stiffness and tinnitus.   Respiratory: Denies SOB, DOE, cough, chest tightness,  and wheezing.   Cardiovascular: Denies chest pain, palpitations and leg swelling.  Gastrointestinal: Denies nausea, vomiting, abdominal pain, diarrhea, constipation, blood in stool and abdominal distention.  Genitourinary: Denies dysuria, urgency, frequency,  hematuria, flank pain and difficulty urinating.  Musculoskeletal: dry and darkened lower extremities, soreness.  Denies myalgias, back pain, joint swelling. Skin: Denies pallor, rash and wound.  Neurological: Denies dizziness, seizures, syncope, weakness, light-headedness, numbness and headaches.  Hematological: Denies adenopathy. Easy bruising, personal or family bleeding history  Psychiatric/Behavioral: Denies suicidal ideation, mood changes, confusion, nervousness, sleep disturbance and agitation  Objective:  Physical Exam: Filed Vitals:   09/18/12 1551  BP: 148/62  Pulse: 69  Temp: 97.1 F (36.2 C)  TempSrc: Oral  Weight: 180 lb 1.6 oz (81.693 kg)  SpO2: 97%   Constitutional: Vital signs reviewed.  Patient is a well-developed and well-nourished female in no acute distress and cooperative with exam. Alert and oriented x3.  Head: Normocephalic and atraumatic Ear: TM normal bilaterally Mouth: no erythema or exudates, MMM Eyes: PERRL, EOMI.  Left eye visible lower eyelid erythema, edema, non-tender to palpation, no visible pus drainage and no focal area with head in eye lid.  Palpable bump on left eye area near eyebrows from fall several years ago, non-tender to palpation.  Yellow crusted material on lateral aspect of lower eyelashes.   Neck: Supple, Trachea midline normal ROM Cardiovascular: RRR, S1 normal, S2 normal, no MRG, pulses symmetric and intact bilaterally Pulmonary/Chest: CTAB, no wheezes, rales, or rhonchi Abdominal: Soft. Obese. Non-tender, non-distended, bowel sounds are normal, no masses, or guarding present.  GU: no CVA tenderness Musculoskeletal: No joint deformities, ROM full, reports soreness upon palpation of b/l lower extremities, darkened discoloration with patches of excoriations and healing pink skin.  No visible open ulcers. Diminished +1 distal pulses.  Poor foot hygiene, outgrown and deformed toenails, mal-odorous, flaking dry skin, darkened discoloration of  toe nails.   Hematology: no cervical, inginal, or axillary adenopathy.  Neurological: A&O x3, sitting in wheelchair and walks with support of cane.  Strength is normal and symmetric bilaterally, cranial nerve II-XII are grossly intact, no focal motor deficit, sensory intact to light touch bilaterally.  Skin: Warm, dry and intact. No cyanosis, or clubbing.  Psychiatric: Normal mood and affect. speech and behavior is normal. Judgment and thought content normal. Cognition and memory are normal.   Assessment & Plan:  Discussed with Dr. Dalphine Handing Erythromycin eye drops for left eye Urgent optho referral Podiatry and vascular referral F/u cmet, cbc Chronic no show-close follow up 1 month with pcp

## 2012-09-18 NOTE — Assessment & Plan Note (Addendum)
BP Readings from Last 3 Encounters:  09/18/12 148/62  11/07/10 189/74  04/25/08 186/77   Lab Results  Component Value Date   NA 143 12/19/2010   K 3.2* 12/19/2010   CREATININE 0.90 12/19/2010   Assessment:  Blood pressure control: controlled  Progress toward BP goal:  at goal mildly elevated today  Plan:  Medications:  continue current medications chlorthalidone 25mg  and losartan 50mg  qd  Educational resources provided: brochure (Simultaneous filing. User may not have seen previous data.)  Self management tools provided: home blood pressure logbook (Simultaneous filing. User may not have seen previous data.)  Other plans: was started on medications 2012 and never had follow up lab work done.  Chronic now show.  -f/u cmet -rtc 1 month

## 2012-09-18 NOTE — Patient Instructions (Addendum)
General Instructions: Please follow up with your eye doctor right away and have them send [place] a report  Please follow up with your foot doctor as soon as possible  All referrals have been made today  You will also need to see vascular for your legs  Please use the antibiotic eye drops to the left eye: 2-4 times/day x5-7 days, if no improvement call clinic  Keep your eye clean as much as possible, if you start noticing change in your vision, pain, or pus drainage contact eye doctor and or clinic [phone] immediately   Treatment Goals:  Goals (1 Years of Data) as of 09/18/12   None     Progress Toward Treatment Goals:  Treatment Goal 09/18/2012  Blood pressure at goal  Other  at goal   Self Care Goals & Plans:  Self Care Goal 09/18/2012  Manage my medications bring my medications to every visit  Monitor my health keep track of my blood pressure   Care Management & Community Referrals:  Referral 09/18/2012  Referrals made for care management support none needed    Erythromycin eye ointment What is this medicine? ERYTHROMYCIN (er ith roe MYE sin) is a macrolide antibiotic. It is used to treat bacterial eye infections. It also prevents a certain type of eye infection that can occur in some babies. This medicine may be used for other purposes; ask your health care provider or pharmacist if you have questions. What should I tell my health care provider before I take this medicine? -if you have an unusual or allergic reaction to erythromycin, foods, dyes, or preservatives -pregnant or trying to get pregnant -breast-feeding How should I use this medicine? This medicine is only for use in the eye. Follow the directions on the prescription label. Wash hands before and after use. Tilt your head back slightly and pull your lower eyelid down with your index finger to form a pouch. Try not to touch the tip of the tube, to your eye, fingertips, or any other surface. Squeeze the end of the  tube to apply a thin layer of the ointment to the inside of the lower eyelid. Close the eye gently to spread the ointment. Your vision may blur for a few minutes. Use your doses at regular intervals. Do not use your medicine more often than directed. Finish the full course prescribed by your doctor or health care professional even if you think your condition is better. Do not stop using except on the advice of your doctor or health care professional. Talk to your pediatrician regarding the use of this medicine in children. Special care may be needed. Overdosage: If you think you have taken too much of this medicine contact a poison control center or emergency room at once. NOTE: This medicine is only for you. Do not share this medicine with others. What if I miss a dose? If you miss a dose, use it as soon as you can. If it is almost time for your next dose, use only that dose. Do not use double or extra doses. What may interact with this medicine? Interactions are not expected. Do not use any other eye products without telling your doctor or health care professional. This list may not describe all possible interactions. Give your health care provider a list of all the medicines, herbs, non-prescription drugs, or dietary supplements you use. Also tell them if you smoke, drink alcohol, or use illegal drugs. Some items may interact with your medicine. What should I watch  for while using this medicine? Tell your doctor or health care professional if your symptoms do not improve in 2 to 3 days. What side effects may I notice from receiving this medicine? Side effects that you should report to your doctor or health care professional as soon as possible: -allergic reactions like skin rash, itching or hives, swelling of the face, lips, or tongue -burning, stinging, or itching of the eyes or eyelids -changes in vision -redness, swelling, or pain This list may not describe all possible side effects. Call your  doctor for medical advice about side effects. You may report side effects to FDA at 1-800-FDA-1088. Where should I keep my medicine? Keep out of the reach of children. Store at room temperature between 15 and 30 degrees C (59 and 86 degrees F). Do not freeze. Throw away any unused ointment after the expiration date. NOTE: This sheet is a summary. It may not cover all possible information. If you have questions about this medicine, talk to your doctor, pharmacist, or health care provider.  2012, [employer] Standard. (10/19/2007 5:17:39 PM)  Blepharitis Blepharitis is a skin problem that makes your eyelids watery, red, puffy (swollen), crusty, scaly, or painful. It may also make your eyes itch. You may lose eyelashes. HOME CARE  Keep your hands clean.  Use a clean towel each time you dry your eyelids. Do not share towels or makeup with anyone.  Carefully wash your eyelids and eyelashes 2 times a day. Use warm water and baby shampoo or just water.  Wash your face and eyebrows at least once a day.  Hold a folded washcloth under warm water. Squeeze the water out. Put the warm washcloth on your eyes 2 times a day for 10 minutes, or as told by your doctor.  Apply medicated cream as told by your doctor.  Avoid rubbing your eyes.  Avoid wearing makeup until you get better.  Follow up with your doctor as told. GET HELP RIGHT AWAY IF:  Your pain, redness, or puffiness gets worse.  Your pain, redness, or puffiness spreads to other parts of your face.  Your vision changes, or you have pain when looking at lights or moving objects.  You have a fever.  You do not get better after 2 to 4 days. MAKE SURE YOU:  Understand these instructions.  Will watch your condition.  Will get help right away if you are not doing well or get worse. Document Released: 03/26/2008 Document Revised: 09/09/2011 Document Reviewed: 07/25/2010 [facility] Patient Information 2013 [place], [place].

## 2012-09-18 NOTE — Assessment & Plan Note (Signed)
Claims she rubbed bengay on her lower extremities some time ago that burnt her skin, however, she has b/l darkened lower extremity skin, areas of dried up blackened excoriations, healing pink skin, diminished b/l distal pulses.  C/o soreness on deep palpation, able to walk. No hx of diabetes. Sensation in tact.   -vascular referral -follow up in clinic 1 month

## 2012-09-18 NOTE — Assessment & Plan Note (Signed)
Gave pneumococcal vaccine today.  She refused flu and tdap vaccination at this time but can be re-visited on future visit.  Also did not want to get zostavax vaccine at this time.

## 2012-09-18 NOTE — Assessment & Plan Note (Signed)
Chronic dry, discolored, long, and malformed b/l toe nails.  Unable to clip she claims for some time.  Mal odorous.  No visible ulcer on feet but very dry and flaky skin.    -urgent podiatry consult

## 2012-09-22 ENCOUNTER — Other Ambulatory Visit: Payer: Self-pay | Admitting: *Deleted

## 2012-09-22 ENCOUNTER — Other Ambulatory Visit: Payer: Self-pay

## 2012-09-22 DIAGNOSIS — R0989 Other specified symptoms and signs involving the circulatory and respiratory systems: Secondary | ICD-10-CM

## 2012-09-22 DIAGNOSIS — I872 Venous insufficiency (chronic) (peripheral): Secondary | ICD-10-CM

## 2012-10-21 ENCOUNTER — Encounter: Payer: Self-pay | Admitting: Vascular Surgery

## 2012-10-22 ENCOUNTER — Encounter: Payer: Medicare Other | Admitting: Vascular Surgery

## 2012-12-02 ENCOUNTER — Encounter: Payer: Self-pay | Admitting: Vascular Surgery

## 2012-12-03 ENCOUNTER — Encounter: Payer: Medicare Other | Admitting: Vascular Surgery

## 2012-12-03 NOTE — Addendum Note (Signed)
Addended by: Neomia Dear on: 12/03/2012 04:56 PM   Modules accepted: Orders

## 2012-12-12 ENCOUNTER — Emergency Department (HOSPITAL_COMMUNITY): Payer: Medicare Other

## 2012-12-12 ENCOUNTER — Encounter (HOSPITAL_COMMUNITY): Payer: Self-pay | Admitting: Family Medicine

## 2012-12-12 ENCOUNTER — Inpatient Hospital Stay (HOSPITAL_COMMUNITY)
Admission: EM | Admit: 2012-12-12 | Discharge: 2012-12-17 | DRG: 516 | Disposition: A | Discharge status: Home / Self Care | Payer: Medicare Other | Attending: Internal Medicine | Admitting: Internal Medicine

## 2012-12-12 ENCOUNTER — Observation Stay (HOSPITAL_COMMUNITY): Payer: Medicare Other

## 2012-12-12 DIAGNOSIS — Z87891 Personal history of nicotine dependence: Secondary | ICD-10-CM | POA: Diagnosis not present

## 2012-12-12 DIAGNOSIS — H44009 Unspecified purulent endophthalmitis, unspecified eye: Secondary | ICD-10-CM | POA: Diagnosis present

## 2012-12-12 DIAGNOSIS — J9819 Other pulmonary collapse: Secondary | ICD-10-CM | POA: Diagnosis not present

## 2012-12-12 DIAGNOSIS — R42 Dizziness and giddiness: Secondary | ICD-10-CM | POA: Diagnosis not present

## 2012-12-12 DIAGNOSIS — I359 Nonrheumatic aortic valve disorder, unspecified: Secondary | ICD-10-CM | POA: Diagnosis present

## 2012-12-12 DIAGNOSIS — I776 Arteritis, unspecified: Secondary | ICD-10-CM | POA: Diagnosis present

## 2012-12-12 DIAGNOSIS — R519 Headache, unspecified: Secondary | ICD-10-CM | POA: Diagnosis present

## 2012-12-12 DIAGNOSIS — E785 Hyperlipidemia, unspecified: Secondary | ICD-10-CM | POA: Diagnosis present

## 2012-12-12 DIAGNOSIS — D509 Iron deficiency anemia, unspecified: Secondary | ICD-10-CM | POA: Diagnosis present

## 2012-12-12 DIAGNOSIS — B351 Tinea unguium: Secondary | ICD-10-CM | POA: Diagnosis present

## 2012-12-12 DIAGNOSIS — K5730 Diverticulosis of large intestine without perforation or abscess without bleeding: Secondary | ICD-10-CM | POA: Diagnosis not present

## 2012-12-12 DIAGNOSIS — I872 Venous insufficiency (chronic) (peripheral): Secondary | ICD-10-CM | POA: Diagnosis present

## 2012-12-12 DIAGNOSIS — I10 Essential (primary) hypertension: Secondary | ICD-10-CM | POA: Diagnosis present

## 2012-12-12 DIAGNOSIS — H02109 Unspecified ectropion of unspecified eye, unspecified eyelid: Secondary | ICD-10-CM | POA: Diagnosis present

## 2012-12-12 DIAGNOSIS — R51 Headache: Secondary | ICD-10-CM | POA: Diagnosis present

## 2012-12-12 DIAGNOSIS — K449 Diaphragmatic hernia without obstruction or gangrene: Secondary | ICD-10-CM | POA: Diagnosis present

## 2012-12-12 DIAGNOSIS — R509 Fever, unspecified: Secondary | ICD-10-CM

## 2012-12-12 DIAGNOSIS — J449 Chronic obstructive pulmonary disease, unspecified: Secondary | ICD-10-CM | POA: Diagnosis present

## 2012-12-12 DIAGNOSIS — R0602 Shortness of breath: Secondary | ICD-10-CM | POA: Diagnosis not present

## 2012-12-12 DIAGNOSIS — D649 Anemia, unspecified: Secondary | ICD-10-CM | POA: Diagnosis present

## 2012-12-12 DIAGNOSIS — R700 Elevated erythrocyte sedimentation rate: Secondary | ICD-10-CM | POA: Diagnosis not present

## 2012-12-12 DIAGNOSIS — K219 Gastro-esophageal reflux disease without esophagitis: Secondary | ICD-10-CM | POA: Diagnosis present

## 2012-12-12 DIAGNOSIS — Z8673 Personal history of transient ischemic attack (TIA), and cerebral infarction without residual deficits: Secondary | ICD-10-CM | POA: Diagnosis present

## 2012-12-12 DIAGNOSIS — I739 Peripheral vascular disease, unspecified: Secondary | ICD-10-CM | POA: Diagnosis present

## 2012-12-12 DIAGNOSIS — L03119 Cellulitis of unspecified part of limb: Secondary | ICD-10-CM | POA: Diagnosis not present

## 2012-12-12 DIAGNOSIS — M316 Other giant cell arteritis: Principal | ICD-10-CM | POA: Diagnosis present

## 2012-12-12 DIAGNOSIS — E119 Type 2 diabetes mellitus without complications: Secondary | ICD-10-CM | POA: Diagnosis not present

## 2012-12-12 DIAGNOSIS — F411 Generalized anxiety disorder: Secondary | ICD-10-CM | POA: Diagnosis present

## 2012-12-12 DIAGNOSIS — W19XXXA Unspecified fall, initial encounter: Secondary | ICD-10-CM | POA: Diagnosis present

## 2012-12-12 DIAGNOSIS — J4489 Other specified chronic obstructive pulmonary disease: Secondary | ICD-10-CM | POA: Diagnosis present

## 2012-12-12 DIAGNOSIS — R000 Tachycardia, unspecified: Secondary | ICD-10-CM | POA: Diagnosis present

## 2012-12-12 DIAGNOSIS — R2689 Other abnormalities of gait and mobility: Secondary | ICD-10-CM | POA: Diagnosis present

## 2012-12-12 DIAGNOSIS — E559 Vitamin D deficiency, unspecified: Secondary | ICD-10-CM | POA: Diagnosis not present

## 2012-12-12 DIAGNOSIS — I498 Other specified cardiac arrhythmias: Secondary | ICD-10-CM | POA: Diagnosis present

## 2012-12-12 DIAGNOSIS — D210 Benign neoplasm of connective and other soft tissue of head, face and neck: Secondary | ICD-10-CM | POA: Diagnosis not present

## 2012-12-12 DIAGNOSIS — D126 Benign neoplasm of colon, unspecified: Secondary | ICD-10-CM | POA: Diagnosis present

## 2012-12-12 DIAGNOSIS — E860 Dehydration: Secondary | ICD-10-CM | POA: Diagnosis present

## 2012-12-12 DIAGNOSIS — G44209 Tension-type headache, unspecified, not intractable: Secondary | ICD-10-CM | POA: Diagnosis present

## 2012-12-12 DIAGNOSIS — L02619 Cutaneous abscess of unspecified foot: Secondary | ICD-10-CM | POA: Diagnosis present

## 2012-12-12 DIAGNOSIS — H02105 Unspecified ectropion of left lower eyelid: Secondary | ICD-10-CM | POA: Diagnosis present

## 2012-12-12 LAB — PROCALCITONIN: Procalcitonin: 0.19 ng/mL

## 2012-12-12 LAB — URINALYSIS, ROUTINE W REFLEX MICROSCOPIC
Ketones, ur: NEGATIVE mg/dL
Leukocytes, UA: NEGATIVE
Nitrite: NEGATIVE
Urobilinogen, UA: 1 mg/dL (ref 0.0–1.0)
pH: 5 (ref 5.0–8.0)

## 2012-12-12 LAB — COMPREHENSIVE METABOLIC PANEL
Albumin: 3.4 g/dL — ABNORMAL LOW (ref 3.5–5.2)
Alkaline Phosphatase: 65 U/L (ref 39–117)
BUN: 34 mg/dL — ABNORMAL HIGH (ref 6–23)
Creatinine, Ser: 1.33 mg/dL — ABNORMAL HIGH (ref 0.50–1.10)
GFR calc Af Amer: 42 mL/min — ABNORMAL LOW (ref >90)
Glucose, Bld: 115 mg/dL — ABNORMAL HIGH (ref 70–99)
Total Bilirubin: 0.9 mg/dL (ref 0.3–1.2)
Total Protein: 8.3 g/dL (ref 6.0–8.3)

## 2012-12-12 LAB — CBC WITH DIFFERENTIAL/PLATELET
Basophils Relative: 0 % (ref 0–1)
Eosinophils Absolute: 0.1 10*3/uL (ref 0.0–0.7)
HCT: 33.8 % — ABNORMAL LOW (ref 36.0–46.0)
Hemoglobin: 11.8 g/dL — ABNORMAL LOW (ref 12.0–15.0)
Lymphs Abs: 0.7 10*3/uL (ref 0.7–4.0)
MCH: 32 pg (ref 26.0–34.0)
MCHC: 34.9 g/dL (ref 30.0–36.0)
MCV: 91.6 fL (ref 78.0–100.0)
Monocytes Absolute: 0.9 10*3/uL (ref 0.1–1.0)
Monocytes Relative: 11 % (ref 3–12)
RBC: 3.69 MIL/uL — ABNORMAL LOW (ref 3.87–5.11)

## 2012-12-12 LAB — PROTIME-INR
INR: 1.17 (ref 0.00–1.49)
Prothrombin Time: 14.7 seconds (ref 11.6–15.2)

## 2012-12-12 LAB — LIPASE, BLOOD: Lipase: 19 U/L (ref 11–59)

## 2012-12-12 LAB — CG4 I-STAT (LACTIC ACID): Lactic Acid, Venous: 2.19 mmol/L (ref 0.5–2.2)

## 2012-12-12 LAB — POCT I-STAT TROPONIN I: Troponin i, poc: 0.06 ng/mL (ref 0.00–0.08)

## 2012-12-12 MED ORDER — SODIUM CHLORIDE 0.9 % IV SOLN
1000.0000 mL | Freq: Once | INTRAVENOUS | Status: AC
  Administered 2012-12-12: 1000 mL via INTRAVENOUS

## 2012-12-12 MED ORDER — SODIUM CHLORIDE 0.9 % IV SOLN: INTRAVENOUS | Status: DC

## 2012-12-12 MED ORDER — METOPROLOL SUCCINATE ER 100 MG PO TB24
200.0000 mg | ORAL_TABLET | Freq: Every day | ORAL | Status: DC
  Filled 2012-12-12 (×2): qty 1

## 2012-12-12 MED ORDER — PIPERACILLIN-TAZOBACTAM 3.375 G IVPB 30 MIN
3.3750 g | Freq: Once | INTRAVENOUS | Status: AC
  Administered 2012-12-12: 3.375 g via INTRAVENOUS
  Filled 2012-12-12: qty 50

## 2012-12-12 MED ORDER — VANCOMYCIN HCL IN DEXTROSE 1-5 GM/200ML-% IV SOLN
1000.0000 mg | Freq: Once | INTRAVENOUS | Status: AC
  Administered 2012-12-12: 1000 mg via INTRAVENOUS
  Filled 2012-12-12: qty 200

## 2012-12-12 MED ORDER — METOPROLOL SUCCINATE ER 100 MG PO TB24
200.0000 mg | ORAL_TABLET | Freq: Every day | ORAL | Status: DC
  Administered 2012-12-12 – 2012-12-17 (×6): 200 mg via ORAL
  Filled 2012-12-12 (×5): qty 2

## 2012-12-12 MED ORDER — ERYTHROMYCIN 5 MG/GM OP OINT
1.0000 "application " | TOPICAL_OINTMENT | Freq: Two times a day (BID) | OPHTHALMIC | Status: DC
  Administered 2012-12-12 – 2012-12-15 (×6): 1 via OPHTHALMIC
  Filled 2012-12-12 (×2): qty 3.5

## 2012-12-12 MED ORDER — SODIUM CHLORIDE 0.9 % IV SOLN
1000.0000 mL | INTRAVENOUS | Status: DC
  Administered 2012-12-12: 1000 mL via INTRAVENOUS

## 2012-12-12 MED ORDER — ASPIRIN EC 81 MG PO TBEC
81.0000 mg | DELAYED_RELEASE_TABLET | Freq: Every day | ORAL | Status: DC
  Administered 2012-12-12 – 2012-12-17 (×6): 81 mg via ORAL
  Filled 2012-12-12 (×7): qty 1

## 2012-12-12 MED ORDER — ENOXAPARIN SODIUM 40 MG/0.4ML ~~LOC~~ SOLN
40.0000 mg | SUBCUTANEOUS | Status: DC
  Administered 2012-12-12 – 2012-12-16 (×4): 40 mg via SUBCUTANEOUS
  Filled 2012-12-12 (×7): qty 0.4

## 2012-12-12 MED ORDER — METOPROLOL SUCCINATE ER 100 MG PO TB24: 200.0000 mg | ORAL_TABLET | Freq: Every day | ORAL | Status: DC

## 2012-12-12 MED ORDER — ACETAMINOPHEN 325 MG PO TABS
650.0000 mg | ORAL_TABLET | Freq: Four times a day (QID) | ORAL | Status: DC | PRN
  Administered 2012-12-12: 650 mg via ORAL
  Filled 2012-12-12: qty 2

## 2012-12-12 MED ORDER — OXYCODONE HCL 5 MG PO TABS
5.0000 mg | ORAL_TABLET | ORAL | Status: DC | PRN
  Administered 2012-12-13: 5 mg via ORAL
  Filled 2012-12-12: qty 1

## 2012-12-12 MED ORDER — PIPERACILLIN-TAZOBACTAM 3.375 G IVPB: 3.3750 g | Freq: Three times a day (TID) | INTRAVENOUS | Status: DC

## 2012-12-12 MED ORDER — PANTOPRAZOLE SODIUM 40 MG PO TBEC
40.0000 mg | DELAYED_RELEASE_TABLET | Freq: Every day | ORAL | Status: DC
  Administered 2012-12-12 – 2012-12-17 (×6): 40 mg via ORAL
  Filled 2012-12-12 (×6): qty 1

## 2012-12-12 MED ORDER — VANCOMYCIN HCL IN DEXTROSE 1-5 GM/200ML-% IV SOLN: 1000.0000 mg | INTRAVENOUS | Status: DC

## 2012-12-12 MED ORDER — ACETAMINOPHEN 325 MG PO TABS
650.0000 mg | ORAL_TABLET | Freq: Once | ORAL | Status: AC
  Administered 2012-12-12: 650 mg via ORAL
  Filled 2012-12-12: qty 2

## 2012-12-12 NOTE — H&P (Signed)
Date: 12/12/2012               Patient Name:  Jane [name] MRN: [id number]  DOB: May 25, 1929 Age / Sex: 79 y.o., female   PCP: Jane Schwartz, MD         Medical Service: Internal Medicine Teaching Service         Attending Physician: Dr. Rogelia Boga    First Contact: Dr. Elenor Legato Pager: [phone]  Second Contact: Dr. Suszanne Conners Pager: [phone]       After Hours (After 5p/  First Contact Pager: [phone]  weekends / holidays): Second Contact Pager: [phone]   Chief Complaint: headache  History of Present Illness: Pt is a 79 y.o. woman with PMH significant for hypertension, HLD,  remote prior CVA, chronic venous insufficiency, who presents to the ED with complaints of bitemporal headache which has persisted for 3 days. She admits to decreased consumption of food recently due to decreased appetite and states she always drinks very little liquids. She denies fever/chills. Denies dysuria, hematuria, or suprapubic/abdominal pain. Denies CP, SOB, or cough. Aside from her headache she states she otherwise feels well.   Jane [name] lives alone in a one-bedroom apartment. She does all of her own cooking and has no assistance at her residence. She states she has been taking her medications as prescribed.   Meds: Current Facility-Administered Medications  Medication Dose Route Frequency Provider Last Rate Last Dose  . 0.9 %  sodium chloride infusion  1,000 mL Intravenous Continuous Glynn Octave, MD 125 mL/hr at 12/12/12 1335 1,000 mL at 12/12/12 1335   Current Outpatient Prescriptions  Medication Sig Dispense Refill  . aspirin EC 81 MG tablet Take 81 mg by mouth daily.      . chlorthalidone (HYGROTON) 25 MG tablet Take 25 mg by mouth daily.      Marland Kitchen erythromycin ophthalmic ointment Place 1 application into the left eye See admin instructions. Instill 1/2" (1.25cm) two to four times daily for 5 to 7 days.      . Ferrous Gluconate (FERATE) 256 (28 FE) MG TABS Take 256 mg by mouth daily.   30 tablet  3  . losartan (COZAAR) 50 MG tablet Take 1 tablet (50 mg total) by mouth daily.  90 tablet  3  . metoprolol (TOPROL-XL) 200 MG 24 hr tablet Take 200 mg by mouth daily.      Marland Kitchen omeprazole (PRILOSEC) 20 MG capsule Take 20 mg by mouth daily. Will sometimes take twice daily if heartburn increases.        Allergies: Allergies as of 12/12/2012  . (No Known Allergies)   Past Medical History  Diagnosis Date  . Anemia   . Anxiety   . Arthritis   . GERD (gastroesophageal reflux disease)   . Hyperlipidemia   . Hypertension   . Vitamin D deficiency   . Colon polyp     2009 colonoscopy  . Hiatal hernia   . Diverticulosis   . Aortic insufficiency     mild  . Stroke 1975   Past Surgical History  Procedure Laterality Date  . Appendectomy    . Abdominal hysterectomy     Family History  Problem Relation Age of Onset  . Stroke Mother   . Hypertension Mother   . Stroke Father   . Hypertension Father   . Diabetes Sister    History   Social History  . Marital Status: Widowed    Spouse Name: N/A    Number of  Children: N/A  . Years of Education: N/A   Occupational History  . Not on file.   Social History Main Topics  . Smoking status: Former Smoker    Types: Cigarettes    Quit date: 07/01/1984  . Smokeless tobacco: Not on file  . Alcohol Use: No  . Drug Use: No  . Sexually Active: No   Other Topics Concern  . Not on file   Social History Narrative  . No narrative on file    Review of Systems: Review of Systems  Constitutional: Negative for fever, chills and malaise/fatigue.  HENT: Negative for congestion.   Eyes: Positive for discharge (L eye) and redness (L eye).  Respiratory: Negative for cough, shortness of breath and wheezing.   Cardiovascular: Negative for chest pain.  Gastrointestinal: Negative for nausea, vomiting, abdominal pain, diarrhea, blood in stool and melena.  Genitourinary: Negative for dysuria, urgency and hematuria.  Musculoskeletal:  Negative.   Neurological: Positive for headaches. Negative for sensory change, focal weakness and weakness.  Endo/Heme/Allergies: Negative.   Psychiatric/Behavioral: Negative.      Physical Exam: Blood pressure 124/53, pulse 98, temperature 98.8 F (37.1 C), temperature source Oral, resp. rate 19, SpO2 98.00%. Physical Exam  Constitutional: She is oriented to person, place, and time and well-developed, well-nourished, and in no distress. No distress.  HENT:  Head: Normocephalic and atraumatic.  Eyes: EOM are normal. Pupils are equal, round, and reactive to light.  L lower eyelid everted and hyperemic. Small amount of purulent discharge present.  Neck: Normal range of motion. Neck supple. No tracheal deviation present.  Cardiovascular: Normal rate and regular rhythm.   No murmur heard. Pulmonary/Chest: Effort normal. She has no wheezes. She has no rales.  Abdominal: Soft. Bowel sounds are normal. She exhibits no distension. There is no tenderness.  Musculoskeletal: Normal range of motion. She exhibits no edema and no tenderness.  Neurological: She is alert and oriented to person, place, and time. No cranial nerve deficit.  Skin: Skin is warm and dry.  Skin changes of chronic venous disease apparent in BLE. Area of partially detached non-viable epidermal tissue overlying healing epidermal tissue/scar tissue in pretibial area of LLE. No pretibial edema.   Psychiatric: Affect and judgment normal.     Lab results: Basic Metabolic Panel:  Recent Labs  16/10/96 1228  NA 138  K 3.8  CL 97  CO2 23  GLUCOSE 115*  BUN 34*  CREATININE 1.33*  CALCIUM 9.1   Liver Function Tests:  Recent Labs  12/12/12 1228  AST 20  ALT 12  ALKPHOS 65  BILITOT 0.9  PROT 8.3  ALBUMIN 3.4*    Recent Labs  12/12/12 1228  LIPASE 19   CBC:  Recent Labs  12/12/12 1228  WBC 8.4  NEUTROABS 6.7  HGB 11.8*  HCT 33.8*  MCV 91.6  PLT 136*   Coagulation:  Recent Labs  12/12/12 1228    LABPROT 14.7  INR 1.17   Urinalysis:  Recent Labs  12/12/12 1518  COLORURINE YELLOW  LABSPEC 1.022  PHURINE 5.0  GLUCOSEU NEGATIVE  HGBUR MODERATE*  BILIRUBINUR SMALL*  KETONESUR NEGATIVE  PROTEINUR >300*  UROBILINOGEN 1.0  NITRITE NEGATIVE  LEUKOCYTESUR NEGATIVE   Imaging results:  Ct Head Wo Contrast  12/12/2012   *RADIOLOGY REPORT*  Clinical Data: Severe headache.  Tachycardia.  Anemia. Hypertension.  Previous stroke.  CT HEAD WITHOUT CONTRAST  Technique:  Contiguous axial images were obtained from the base of the skull through the vertex without contrast.  Comparison: 12/19/2010  Findings: There is no evidence of intracranial hemorrhage, brain edema or other signs of acute infarction.  There is no evidence of intracranial mass lesion or mass effect.  No abnormal extra-axial fluid collections are identified.  Mild to moderate cerebral atrophy is stable.  Moderate chronic small vessel disease is also stable in appearance.  Ventricles are normal in size.  No skull abnormality identified.  IMPRESSION:  1.  No acute intracranial findings. 2.  Stable cerebral atrophy and chronic small vessel disease.   Original Report Authenticated By: Myles Rosenthal, M.D.   Dg Chest Portable 1 View  12/12/2012   *RADIOLOGY REPORT*  Clinical Data: Headache, shortness of breath  PORTABLE CHEST - 1 VIEW  Comparison: 12/05/2010  Findings: Cardiomegaly again noted.  No acute infiltrate or pleural effusion.  No pulmonary edema.  Mild hyperinflation again noted.  IMPRESSION: Cardiomegaly.  Mild hyperinflation.  No active disease.   Original Report Authenticated By: Natasha Mead, M.D.    Other results: EKG: Sinus tachycardia with occasional PACs, rate ~110bpm. Lead placement reversal.   Assessment & Plan by Problem: Fever - Tmax = 101.9 in the ED (rectal temp). Also tachycardic with HR ~ 140 on presentation. Etiology unclear. No mental status changes. No convincing signs of infection--lactate and procalcitonin were  both drawn in the ED and wnl. UTI unlikely as UA negative and pt denies dysuria. Pneumonia unlikely as CXR unrevealing and pt denies any cough or SOB. Pt very comfortable and feeling well after receiving 2L or normal saline in the ED. The history of decreased PO intake given by the patient would be the most likely explanation for her tachycardia and likely also her fever, although she will benefit from admission for observation to rule out alternative causes.  - admit to floor - monitor vitals - 2 view CXR - f/u blood culture results (drawn in ED) - repeat CBC in AM  Dehydration - as described above, likely the cause of the patient's abnormal vitals. Pt insists she cares for herself and cooks for herself at home, however it appears her family members are concerned about her ability to do so on her own. Will evaluate further on this issue to ensure pt is able to safely care for herself and cook regularly enough to provide herself with adequate nutrition to help avoid similar future episodes.  - PT/OT eval - check orthostatics  Headache - pt's complaints of bitemporal headache appear chronic and are most consistent with tension headache. CT head performed in the ED was negative. Temporal arteritis unlikely, however will check ESR. Otherwise will manage symptomatically at this time, particularly as complaints have markedly improved since rehydration with IVF.  - check ESR - oxycodone PRN (avoiding tylenol given concerns of masking fevers)  Everted L lower eyelid - pt unable to give an accurate history as to when this issue began, however it appears it has been present and unchanged for at least 5-6 months. She has been prescribed erythromycin ophthalmic ointment for possible infection associated with the issue, however there does not appear to be any clear signs of infection at time of admission. - cont erythro ophthalmic ointment   Hx stroke - remote (1975). Will continue ASA at this time.   Dispo:  Disposition is deferred at this time, awaiting improvement of current medical problems. Anticipated discharge in approximately 1-2 day(s).   The patient does have a current PCP [facility] Montey Hora, MD) and does need an [facility] hospital follow-up appointment after discharge.  The  patient does not have transportation limitations that hinder transportation to clinic appointments.  Signed: Elfredia Nevins, MD 12/12/2012, 4:52 PM

## 2012-12-12 NOTE — Progress Notes (Signed)
ANTIBIOTIC CONSULT NOTE - INITIAL  Pharmacy Consult for Vancomycin/Zosyn Indication: rule out sepsis, unknown source  Allergies  Allergen Reactions  . Sulfonamide Derivatives     Patient Measurements:   Wt: 81.7 kg  Vital Signs: Temp: 100.1 F (37.8 C) (06/14 1148) Temp src: Oral (06/14 1148) BP: 154/69 mmHg (06/14 1148) Pulse Rate: 135 (06/14 1148) Intake/Output from previous day:   Intake/Output from this shift:    Labs: No results found for this basename: WBC, HGB, PLT, LABCREA, CREATININE,  in the last 72 hours The CrCl is unknown because both a height and weight (above a minimum accepted value) are required for this calculation. No results found for this basename: VANCOTROUGH, VANCOPEAK, VANCORANDOM, GENTTROUGH, GENTPEAK, GENTRANDOM, TOBRATROUGH, TOBRAPEAK, TOBRARND, AMIKACINPEAK, AMIKACINTROU, AMIKACIN,  in the last 72 hours   Microbiology: No results found for this or any previous visit (from the past 720 hour(s)).  Medical History: Past Medical History  Diagnosis Date  . Anemia   . Anxiety   . Arthritis   . GERD (gastroesophageal reflux disease)   . Hyperlipidemia   . Hypertension   . Vitamin D deficiency   . Colon polyp     2009 colonoscopy  . Hiatal hernia   . Diverticulosis   . Aortic insufficiency     mild  . Stroke 1975   Assessment: 79 y/o F presents with 3 day h/o HA found to be febrile and tachycardic. Code Sepsis called. Vancomycin and Zosyn per pharmacy. Tmax 100.1, HR 135, WBC 8.4, Scr 1.33, LA 2.19.   Goal of Therapy:  Vancomycin trough level 15-20 mcg/ml  Plan:  -Start vancomycin 1000 mg IV q24h -Zosyn 3.375G IV q8h to be infused over 4 hours -Trend WBC, temp, renal function, micro data -F/U source of infection for de-escalation  Abran Duke, PharmD Clinical Pharmacist Phone: [phone] Pager: [phone] 12/12/2012 12:47 PM

## 2012-12-12 NOTE — ED Notes (Signed)
Per pt HA for 3 days. Denies falling or hitting head. Per family pt needs a full work up. Family sts she can barely walk. Pt tachycardic at triage. No other complaints.

## 2012-12-12 NOTE — Progress Notes (Signed)
Pharmacy Code Sepsis Response:  Antibiotics  -Vancomycin 1000mg  IV x 1 -Zosyn 3.375g IV x 1  Pulled from Pyxis and delivered to RN, lab in room getting cultures  Abran Duke, PharmD Clinical Pharmacist Phone: [phone] Pager: [phone] 12/12/2012 12:42 PM

## 2012-12-12 NOTE — ED Provider Notes (Signed)
History     CSN: [id number]  Arrival date & time 12/12/12  1143   First MD Initiated Contact with Patient 12/12/12 1209      Chief Complaint  Patient presents with  . Headache    (Consider location/radiation/quality/duration/timing/severity/associated sxs/prior treatment) HPI Comments: Patient presents from home with a 3 day history of frontal headache. She denies any trauma. She states this headache has been constant for the past 3 days she is unable to tell me how it started. He woke up 3 days ago has been constant since. She denies any nausea, vomiting, fever, chest pain or shortness of breath. She appears ill on arrival is tachycardic and febrile. She is a poor historian. She denies any vision change, neck pain or stiffness, cough, urinary symptoms. She denies any focal weakness, numbness or tingling. He denies any chest pain, abdominal pain or back pain. Family states she lives alone and it takes a lot of convincing to get her to see the doctor.  The history is provided by the patient and a relative. The history is limited by the condition of the patient.    Past Medical History  Diagnosis Date  . Anemia   . Anxiety   . Arthritis   . GERD (gastroesophageal reflux disease)   . Hyperlipidemia   . Hypertension   . Vitamin D deficiency   . Colon polyp     2009 colonoscopy  . Hiatal hernia   . Diverticulosis   . Aortic insufficiency     mild  . Stroke 1975    Past Surgical History  Procedure Laterality Date  . Appendectomy    . Abdominal hysterectomy      Family History  Problem Relation Age of Onset  . Stroke Mother   . Hypertension Mother   . Stroke Father   . Hypertension Father   . Diabetes Sister     History  Substance Use Topics  . Smoking status: Former Smoker    Types: Cigarettes    Quit date: 07/01/1984  . Smokeless tobacco: Not on file  . Alcohol Use: No    OB History   Grav Para Term Preterm Abortions TAB SAB Ect Mult Living                   Review of Systems  Constitutional: Positive for fever, activity change, appetite change and fatigue.  HENT: Negative for congestion, sore throat and rhinorrhea.   Eyes: Positive for discharge and redness.  Respiratory: Negative for cough, chest tightness and shortness of breath.   Cardiovascular: Negative for chest pain.  Gastrointestinal: Negative for nausea, vomiting and abdominal pain.  Genitourinary: Negative for dysuria and hematuria.  Musculoskeletal: Positive for myalgias and arthralgias. Negative for back pain.  Skin: Negative for rash.  Neurological: Positive for weakness and headaches. Negative for dizziness.  A complete 10 system review of systems was obtained and all systems are negative except as noted in the HPI and PMH.    Allergies  Review of patient's allergies indicates no known allergies.  Home Medications   Current Outpatient Rx  Name  Route  Sig  Dispense  Refill  . aspirin EC 81 MG tablet   Oral   Take 81 mg by mouth daily.         . chlorthalidone (HYGROTON) 25 MG tablet   Oral   Take 25 mg by mouth daily.         Marland Kitchen erythromycin ophthalmic ointment   Left Eye  Place 1 application into the left eye See admin instructions. Instill 1/2" (1.25cm) two to four times daily for 5 to 7 days.         . Ferrous Gluconate (FERATE) 256 (28 FE) MG TABS   Oral   Take 256 mg by mouth daily.   30 tablet   3   . losartan (COZAAR) 50 MG tablet   Oral   Take 1 tablet (50 mg total) by mouth daily.   90 tablet   3   . metoprolol (TOPROL-XL) 200 MG 24 hr tablet   Oral   Take 200 mg by mouth daily.         Marland Kitchen omeprazole (PRILOSEC) 20 MG capsule   Oral   Take 20 mg by mouth daily. Will sometimes take twice daily if heartburn increases.           BP 124/53  Pulse 98  Temp(Src) 98.8 F (37.1 C) (Oral)  Resp 19  SpO2 98%  Physical Exam  Constitutional: She is oriented to person, place, and time. She appears well-developed and well-nourished.  No distress.  HENT:  Head: Normocephalic and atraumatic.  Mouth/Throat: Oropharynx is clear and moist. No oropharyngeal exudate.  Eyes: Conjunctivae and EOM are normal. Pupils are equal, round, and reactive to light.  Left lower lid is everted, edematous and erythematous. Chronic per patient  Neck: Normal range of motion. Neck supple.  No meningismus  Cardiovascular: Normal rate and normal heart sounds.   No murmur heard. tachycardic  Pulmonary/Chest: Effort normal and breath sounds normal. No respiratory distress.  Abdominal: Soft. There is no tenderness. There is no rebound and no guarding.  Musculoskeletal: Normal range of motion. She exhibits no edema and no tenderness.  Neurological: She is alert and oriented to person, place, and time. No cranial nerve deficit. She exhibits normal muscle tone. Coordination normal.  CN 2-12 intact, no ataxia on finger to nose, no nystagmus, 5/5 strength throughout, no pronator drift, Romberg negative, normal gait.   Skin: Skin is warm.    ED Course  Procedures (including critical care time)  Labs Reviewed  CBC WITH DIFFERENTIAL - Abnormal; Notable for the following:    RBC 3.69 (*)    Hemoglobin 11.8 (*)    HCT 33.8 (*)    Platelets 136 (*)    Neutrophils Relative % 80 (*)    Lymphocytes Relative 9 (*)    All other components within normal limits  COMPREHENSIVE METABOLIC PANEL - Abnormal; Notable for the following:    Glucose, Bld 115 (*)    BUN 34 (*)    Creatinine, Ser 1.33 (*)    Albumin 3.4 (*)    GFR calc non Af Amer 36 (*)    GFR calc Af Amer 42 (*)    All other components within normal limits  URINALYSIS, ROUTINE W REFLEX MICROSCOPIC - Abnormal; Notable for the following:    Hgb urine dipstick MODERATE (*)    Bilirubin Urine SMALL (*)    Protein, ur >300 (*)    All other components within normal limits  URINE MICROSCOPIC-ADD ON - Abnormal; Notable for the following:    Casts GRANULAR CAST (*)    Crystals URIC ACID CRYSTALS  (*)    All other components within normal limits  CULTURE, BLOOD (ROUTINE X 2)  CULTURE, BLOOD (ROUTINE X 2)  URINE CULTURE  PROCALCITONIN  PROTIME-INR  LIPASE, BLOOD  CG4 I-STAT (LACTIC ACID)  POCT I-STAT TROPONIN I   Ct Head Wo Contrast  12/12/2012   *RADIOLOGY REPORT*  Clinical Data: Severe headache.  Tachycardia.  Anemia. Hypertension.  Previous stroke.  CT HEAD WITHOUT CONTRAST  Technique:  Contiguous axial images were obtained from the base of the skull through the vertex without contrast.  Comparison: 12/19/2010  Findings: There is no evidence of intracranial hemorrhage, brain edema or other signs of acute infarction.  There is no evidence of intracranial mass lesion or mass effect.  No abnormal extra-axial fluid collections are identified.  Mild to moderate cerebral atrophy is stable.  Moderate chronic small vessel disease is also stable in appearance.  Ventricles are normal in size.  No skull abnormality identified.  IMPRESSION:  1.  No acute intracranial findings. 2.  Stable cerebral atrophy and chronic small vessel disease.   Original Report Authenticated By: Myles Rosenthal, M.D.   Dg Chest Portable 1 View  12/12/2012   *RADIOLOGY REPORT*  Clinical Data: Headache, shortness of breath  PORTABLE CHEST - 1 VIEW  Comparison: 12/05/2010  Findings: Cardiomegaly again noted.  No acute infiltrate or pleural effusion.  No pulmonary edema.  Mild hyperinflation again noted.  IMPRESSION: Cardiomegaly.  Mild hyperinflation.  No active disease.   Original Report Authenticated By: Natasha Mead, M.D.     1. Fever       MDM  3 day History of diffuse headache without associated symptoms. Denies any cough, congestion, abdominal pain, nausea or vomiting. She is tachycardic and febrile on arrival. She's no meningismus normal mental status. Denies thunderclap onset.  Concern for occult infection. Possibly pneumonia versus UTI. Blood workup obtained, broad spectrum antibiotic started, code sepsis  called.  CT head negative. No leukocytosis. Lactic acid and procalcitonin normal. Improved HR to 100s with IVF and antipyretics. CXR negative.  Patient feeling much improved after IV fluids. UA without convincing evidence of infection.  Suspect headache and tachycardia due to dehydration in setting of poor PO intake.  Patient feeling well and states headache has resolved. Normal neuro exam, no meningismus, no evidence of meningitis.  D/w [facility] residents who will admit for observation and continued hydration.   Date: 12/12/2012  Rate: 108   Rhythm: sinus tachycardia  QRS Axis: normal  Intervals: normal  ST/T Wave abnormalities: normal  Conduction Disutrbances:none  Narrative Interpretation:   Old EKG Reviewed: unchanged    Glynn Octave, MD 12/12/12 1742

## 2012-12-13 ENCOUNTER — Encounter (HOSPITAL_COMMUNITY): Payer: Self-pay | Admitting: *Deleted

## 2012-12-13 DIAGNOSIS — R509 Fever, unspecified: Secondary | ICD-10-CM

## 2012-12-13 LAB — COMPREHENSIVE METABOLIC PANEL
AST: 23 U/L (ref 0–37)
Albumin: 2.7 g/dL — ABNORMAL LOW (ref 3.5–5.2)
Alkaline Phosphatase: 51 U/L (ref 39–117)
BUN: 27 mg/dL — ABNORMAL HIGH (ref 6–23)
CO2: 24 mEq/L (ref 19–32)
Chloride: 104 mEq/L (ref 96–112)
GFR calc non Af Amer: 39 mL/min — ABNORMAL LOW (ref >90)
Potassium: 3.6 mEq/L (ref 3.5–5.1)
Total Bilirubin: 0.6 mg/dL (ref 0.3–1.2)

## 2012-12-13 LAB — CBC
MCH: 30.9 pg (ref 26.0–34.0)
MCV: 91.7 fL (ref 78.0–100.0)
Platelets: 123 10*3/uL — ABNORMAL LOW (ref 150–400)
RDW: 12.9 % (ref 11.5–15.5)

## 2012-12-13 LAB — URINE CULTURE

## 2012-12-13 LAB — SODIUM, URINE, RANDOM: Sodium, Ur: 114 mEq/L

## 2012-12-13 LAB — CREATININE, URINE, RANDOM: Creatinine, Urine: 117.27 mg/dL

## 2012-12-13 MED ORDER — PREDNISONE 20 MG PO TABS
40.0000 mg | ORAL_TABLET | Freq: Every day | ORAL | Status: DC
  Administered 2012-12-14 – 2012-12-17 (×5): 40 mg via ORAL
  Filled 2012-12-13 (×6): qty 2

## 2012-12-13 NOTE — Progress Notes (Signed)
Subjective:    Pt states her headache is improved this AM. Denies CP/SOB. Denies fever/chills. Denies abdominal pain or dysuria.   Interval Events: No acute events.    Objective:    Vital Signs:   Temp:  [98.8 F (37.1 C)-102.2 F (39 C)] 98.8 F (37.1 C) (06/15 0601) Pulse Rate:  [76-145] 99 (06/15 0608) Resp:  [14-27] 16 (06/15 0601) BP: (115-172)/(38-99) 169/99 mmHg (06/15 0608) SpO2:  [90 %-99 %] 98 % (06/15 0601) Weight:  [180 lb (81.647 kg)] 180 lb (81.647 kg) (06/15 0601) Last BM Date: 12/11/12  24-hour weight change: Weight change:   Intake/Output:   Intake/Output Summary (Last 24 hours) at 12/13/12 1103 Last data filed at 12/12/12 2040  Gross per 24 hour  Intake      0 ml  Output    300 ml  Net   -300 ml      Physical Exam: General: Vital signs reviewed and noted. Well-developed, well-nourished, in no acute distress; alert, appropriate and cooperative throughout examination.  HEENT:  Everted L lower eyelid.   Lungs:  Normal respiratory effort. Clear to auscultation BL without crackles or wheezes.  Heart: RRR. S1 and S2 normal without gallop, murmur, or rubs.  Abdomen:  BS normoactive. Soft, Nondistended, non-tender.  No masses or organomegaly.  Extremities: No pretibial edema. Skin changes consistent with chronic venous disease.      Labs:  Basic Metabolic Panel:  Recent Labs Lab 12/12/12 1228 12/13/12 0515  NA 138 139  K 3.8 3.6  CL 97 104  CO2 23 24  GLUCOSE 115* 97  BUN 34* 27*  CREATININE 1.33* 1.24*  CALCIUM 9.1 8.2*    Liver Function Tests:  Recent Labs Lab 12/12/12 1228 12/13/12 0515  AST 20 23  ALT 12 12  ALKPHOS 65 51  BILITOT 0.9 0.6  PROT 8.3 6.8  ALBUMIN 3.4* 2.7*    Recent Labs Lab 12/12/12 1228  LIPASE 19    CBC:  Recent Labs Lab 12/12/12 1228 12/13/12 0515  WBC 8.4 7.6  NEUTROABS 6.7  --   HGB 11.8* 10.0*  HCT 33.8* 29.7*  MCV 91.6 91.7  PLT 136* 123*    Microbiology: Results for orders  placed during the hospital encounter of 12/12/12  CULTURE, BLOOD (ROUTINE X 2)     Status: None   Collection Time    12/12/12 11:40 AM      Result Value Range Status   Specimen Description BLOOD RIGHT ARM   Final   Special Requests BOTTLES DRAWN AEROBIC AND ANAEROBIC 10CC   Final   Culture  Setup Time 12/12/2012 18:52   Final   Culture     Final   Value:        BLOOD CULTURE RECEIVED NO GROWTH TO DATE CULTURE WILL BE HELD FOR 5 DAYS BEFORE ISSUING A FINAL NEGATIVE REPORT   Report Status PENDING   Incomplete  CULTURE, BLOOD (ROUTINE X 2)     Status: None   Collection Time    12/12/12 11:42 AM      Result Value Range Status   Specimen Description BLOOD LEFT ARM   Final   Special Requests BOTTLES DRAWN AEROBIC ONLY 10CC   Final   Culture  Setup Time 12/12/2012 18:52   Final   Culture     Final   Value:        BLOOD CULTURE RECEIVED NO GROWTH TO DATE CULTURE WILL BE HELD FOR 5 DAYS BEFORE ISSUING A FINAL NEGATIVE REPORT  Report Status PENDING   Incomplete    Coagulation Studies:  Recent Labs  12/12/12 1228  LABPROT 14.7  INR 1.17    Imaging: X-ray Chest Pa And Lateral   12/12/2012   **ADDENDUM** CREATED: 12/12/2012 20:52:29  On the lateral views, there is no airspace consolidation. Therefore, the increased density at the left lateral lung base on the frontal view represented overlapping of the heart and soft tissues.  **END ADDENDUM** SIGNED BY: Londell Moh. Azucena Kuba, M.D.  12/12/2012   *RADIOLOGY REPORT*  Clinical Data: Shortness of breath.  CHEST - 2 VIEW  Comparison: Earlier today.  Findings: Stable enlargement of the cardiac silhouette and prominence of the pulmonary vasculature and interstitial markings. Increased density at the left lateral lung base.  Diffuse osteopenia.  IMPRESSION:  1.  Increased density at the left lateral lung base, most likely representing atelectasis and superimposed soft tissues. 2.  Stable cardiomegaly, pulmonary vascular congestion and changes of COPD.    Original Report Authenticated By: Beckie Salts, M.D.   Ct Head Wo Contrast  12/12/2012   *RADIOLOGY REPORT*  Clinical Data: Severe headache.  Tachycardia.  Anemia. Hypertension.  Previous stroke.  CT HEAD WITHOUT CONTRAST  Technique:  Contiguous axial images were obtained from the base of the skull through the vertex without contrast.  Comparison: 12/19/2010  Findings: There is no evidence of intracranial hemorrhage, brain edema or other signs of acute infarction.  There is no evidence of intracranial mass lesion or mass effect.  No abnormal extra-axial fluid collections are identified.  Mild to moderate cerebral atrophy is stable.  Moderate chronic small vessel disease is also stable in appearance.  Ventricles are normal in size.  No skull abnormality identified.  IMPRESSION:  1.  No acute intracranial findings. 2.  Stable cerebral atrophy and chronic small vessel disease.   Original Report Authenticated By: Myles Rosenthal, M.D.   Dg Chest Portable 1 View  12/12/2012   *RADIOLOGY REPORT*  Clinical Data: Headache, shortness of breath  PORTABLE CHEST - 1 VIEW  Comparison: 12/05/2010  Findings: Cardiomegaly again noted.  No acute infiltrate or pleural effusion.  No pulmonary edema.  Mild hyperinflation again noted.  IMPRESSION: Cardiomegaly.  Mild hyperinflation.  No active disease.   Original Report Authenticated By: Natasha Mead, M.D.       Medications:    Infusions: . sodium chloride 1,000 mL (12/12/12 1335)    Scheduled Medications: . aspirin EC  81 mg Oral Daily  . enoxaparin (LOVENOX) injection  40 mg Subcutaneous Q24H  . erythromycin  1 application Left Eye BID  . metoprolol succinate  200 mg Oral Daily  . pantoprazole  40 mg Oral Daily    PRN Medications: oxyCODONE   Assessment/ Plan:   Fever - Tmax = 102.2 overnight with associated tachycardia (HR ~120). No leukocytosis. No identifiable source of infection. 2v CXR unrevealing of pneumonia (results discussed with radiology given presence  of atelectasis). UA unrevealing of leukocytes or nitrites and pt asymptomatic. As pt gives consistent complaints of temporal headache with fever but with absence of other symptoms or a source of infection, concern has grown significantly for temporal arteritis. Patient had an ESR performed which was elevated at 83. Will initiate treatment for presumed temporal arteritis at this time, however will maintain a low threshold of suspicion for alternative diagnoses if pt has new symptoms or her clinical course changes. Pt denies vision changes, so will begin with conservative doses of prednisone.  - monitor vitals  - prednisone 40mg  QD (x2-4  weeks followed by taper) - f/u blood culture results (drawn in ED)  - consult vascular surgery for consideration of temporal artery biopsy  Dehydration - as described above, likely the cause of the patient's abnormal vitals. Pt insists she cares for herself and cooks for herself at home, however it appears her family members are concerned about her ability to do so on her own. Will evaluate further on this issue to ensure pt is able to safely care for herself and cook regularly enough to provide herself with adequate nutrition to help avoid similar future episodes.  - PT/OT eval  - check orthostatics   Everted L lower eyelid - most consistent with ectropion. Pt unable to give an accurate history as to when this issue began, however it appears it has been present and unchanged for at least 5-6 months. She has been prescribed erythromycin ophthalmic ointment for possible infection associated with the issue, however there does not appear to be any clear signs of infection at time of admission.  - cont erythro ophthalmic ointment   Hx stroke - remote (1975). Will continue ASA at this time.     DVT PPX - lovenox  CODE STATUS - full  CONSULTS PLACED - N/A  DISPO - Disposition is deferred at this time, awaiting improvement of current medical problems.   Anticipated  discharge in approximately 1-2 day(s).   The patient does have a current PCP (Bosie Clos, KAREN, MD) and does need an [facility] hospital follow-up appointment after discharge.    Is the [facility] hospital follow-up appointment a one-time only appointment? N/A.  Does the patient have transportation limitations that hinder transportation to clinic appointments? unknown   SERVICE NEEDED AT DISCHARGE - TO BE DETERMINED DURING HOSPITAL COURSE         Y = Yes, Blank = No PT:   OT:   RN:   Equipment:   Other:      Length of Stay: 1 day(s)   Signed: Elfredia Nevins, MD  PGY-1, Internal Medicine Resident Pager: [phone] (7AM-5PM) 12/13/2012, 11:03 AM

## 2012-12-13 NOTE — Progress Notes (Signed)
Physical Therapy Note   12/13/12 1600  PT Visit Information  Last PT Received On 12/13/12  Reason Eval/Treat Not Completed Other (comment) (Pt declining PT today)   Will reattempt PT eval tomorrow; Thanks,  [place], [place] [phone]

## 2012-12-13 NOTE — H&P (Signed)
Date: 12/13/2012  Patient name: Jane [name]  Medical record number: [id number]  Date of birth: [birth year]   I have seen and evaluated Jane [name] and discussed their care with the Residency Team. Jane [name] was seen with Dr Dorise Hiss. Jane [name] came to hospital with CC of new HA for about three days and "wouldn't stop". Located bi temporal areas. She states she used to get a lot of HA in the 1960's. Most recent HA was 1 month ago but this HA is different. She tried no pain pills at home. Also stated she had no appetite over the same time period. She denies jaw claudication or shoulder / hip girdle aching. She denies vision changes except when the eye drops are put in her eyes. She denies wt loss.   She lives alone. [place] some [place]. Has never driven. Her older brother who is in his 55's might drive her to run errands but he is getting old and has trouble getting around the store. She denies falls or trouble walking as long as she takes her time.   Physical Exam: Blood pressure 169/99, pulse 99, temperature 98.8 F (37.1 C), temperature source Oral, resp. rate 16, height 5' (1.524 m), weight 180 lb (81.647 kg), SpO2 98.00%. General appearance: alert, cooperative, appears stated age and no distress Head: Normocephalic, without obvious abnormality, atraumatic Eyes: L lower lid is everted.  Lungs: clear to auscultation bilaterally Heart: regular rate and rhythm, S1, S2 normal, no murmur, click, rub or gallop Abdomen: soft, non-tender; bowel sounds normal; no masses,  no organomegaly Extremities: extremities normal, atraumatic, no cyanosis or edema Pulses: 2+ and symmetric Skin: There is no edema. There is hyperpig areas B LE, L>R. The left has peeling areas and chronic scars / scabs.   Lab results: Results for orders placed during the hospital encounter of 12/12/12 (from the past 24 hour(s))  URINALYSIS, ROUTINE W REFLEX MICROSCOPIC     Status:  Abnormal   Collection Time    12/12/12  3:18 PM      Result Value Range   Color, Urine YELLOW  YELLOW   APPearance CLEAR  CLEAR   Specific Gravity, Urine 1.022  1.005 - 1.030   pH 5.0  5.0 - 8.0   Glucose, UA NEGATIVE  NEGATIVE mg/dL   Hgb urine dipstick MODERATE (*) NEGATIVE   Bilirubin Urine SMALL (*) NEGATIVE   Ketones, ur NEGATIVE  NEGATIVE mg/dL   Protein, ur >562 (*) NEGATIVE mg/dL   Urobilinogen, UA 1.0  0.0 - 1.0 mg/dL   Nitrite NEGATIVE  NEGATIVE   Leukocytes, UA NEGATIVE  NEGATIVE  URINE MICROSCOPIC-ADD ON     Status: Abnormal   Collection Time    12/12/12  3:18 PM      Result Value Range   Squamous Epithelial / LPF RARE  RARE   RBC / HPF 3-6  <3 RBC/hpf   Casts GRANULAR CAST (*) NEGATIVE   Crystals URIC ACID CRYSTALS (*) NEGATIVE  COMPREHENSIVE METABOLIC PANEL     Status: Abnormal   Collection Time    12/13/12  5:15 AM      Result Value Range   Sodium 139  135 - 145 mEq/L   Potassium 3.6  3.5 - 5.1 mEq/L   Chloride 104  96 - 112 mEq/L   CO2 24  19 - 32 mEq/L   Glucose, Bld 97  70 - 99 mg/dL   BUN 27 (*) 6 -  23 mg/dL   Creatinine, Ser 1.61 (*) 0.50 - 1.10 mg/dL   Calcium 8.2 (*) 8.4 - 10.5 mg/dL   Total Protein 6.8  6.0 - 8.3 g/dL   Albumin 2.7 (*) 3.5 - 5.2 g/dL   AST 23  0 - 37 U/L   ALT 12  0 - 35 U/L   Alkaline Phosphatase 51  39 - 117 U/L   Total Bilirubin 0.6  0.3 - 1.2 mg/dL   GFR calc non Af Amer 39 (*) >90 mL/min   GFR calc Af Amer 46 (*) >90 mL/min  CBC     Status: Abnormal   Collection Time    12/13/12  5:15 AM      Result Value Range   WBC 7.6  4.0 - 10.5 K/uL   RBC 3.24 (*) 3.87 - 5.11 MIL/uL   Hemoglobin 10.0 (*) 12.0 - 15.0 g/dL   HCT 09.6 (*) 04.5 - 40.9 %   MCV 91.7  78.0 - 100.0 fL   MCH 30.9  26.0 - 34.0 pg   MCHC 33.7  30.0 - 36.0 g/dL   RDW 81.1  91.4 - 78.2 %   Platelets 123 (*) 150 - 400 K/uL  SEDIMENTATION RATE     Status: Abnormal   Collection Time    12/13/12  5:15 AM      Result Value Range   Sed Rate 83 (*) 0 - 22  mm/hr  SODIUM, URINE, RANDOM     Status: None   Collection Time    12/13/12  7:56 AM      Result Value Range   Sodium, Ur 114    CREATININE, URINE, RANDOM     Status: None   Collection Time    12/13/12  7:56 AM      Result Value Range   Creatinine, Urine 117.27      Imaging results:  X-ray Chest Pa And Lateral   12/12/2012   **ADDENDUM** CREATED: 12/12/2012 20:52:29  On the lateral views, there is no airspace consolidation. Therefore, the increased density at the left lateral lung base on the frontal view represented overlapping of the heart and soft tissues.  **END ADDENDUM** SIGNED BY: Londell Moh. Azucena Kuba, M.D.  12/12/2012   *RADIOLOGY REPORT*  Clinical Data: Shortness of breath.  CHEST - 2 VIEW  Comparison: Earlier today.  Findings: Stable enlargement of the cardiac silhouette and prominence of the pulmonary vasculature and interstitial markings. Increased density at the left lateral lung base.  Diffuse osteopenia.  IMPRESSION:  1.  Increased density at the left lateral lung base, most likely representing atelectasis and superimposed soft tissues. 2.  Stable cardiomegaly, pulmonary vascular congestion and changes of COPD.   Original Report Authenticated By: Beckie Salts, M.D.   Ct Head Wo Contrast  12/12/2012   *RADIOLOGY REPORT*  Clinical Data: Severe headache.  Tachycardia.  Anemia. Hypertension.  Previous stroke.  CT HEAD WITHOUT CONTRAST  Technique:  Contiguous axial images were obtained from the base of the skull through the vertex without contrast.  Comparison: 12/19/2010  Findings: There is no evidence of intracranial hemorrhage, brain edema or other signs of acute infarction.  There is no evidence of intracranial mass lesion or mass effect.  No abnormal extra-axial fluid collections are identified.  Mild to moderate cerebral atrophy is stable.  Moderate chronic small vessel disease is also stable in appearance.  Ventricles are normal in size.  No skull abnormality identified.  IMPRESSION:  1.   No acute intracranial findings. 2.  Stable cerebral atrophy and chronic small vessel disease.   Original Report Authenticated By: Myles Rosenthal, M.D.   Dg Chest Portable 1 View  12/12/2012   *RADIOLOGY REPORT*  Clinical Data: Headache, shortness of breath  PORTABLE CHEST - 1 VIEW  Comparison: 12/05/2010  Findings: Cardiomegaly again noted.  No acute infiltrate or pleural effusion.  No pulmonary edema.  Mild hyperinflation again noted.  IMPRESSION: Cardiomegaly.  Mild hyperinflation.  No active disease.   Original Report Authenticated By: Natasha Mead, M.D.    Assessment and Plan: I have seen and evaluated the patient as outlined above. I agree with the formulated Assessment and Plan as detailed in the residents' admission note, with the following changes:   1. New HA - She meets three (new HA, age, and elevated sed rate) of the 5 ARC (the two that she doesn't meet are temp tenderness and bx c/w vasculitis) criteria for Temp Arteritis, giving high spec and sens for the dx. Therefore, she has been started on steroids and vascular surg will be consulted for possible bx. Other causes are less likely. CNS mass lesions less likely as neuro exam non focal and there is a more likely dx. Therefore, no CNS imaging.   2. Febrile illness - CVS and exam are nl. She has no sxs of a UTI so even if UA and Cx are + it would be asymptomatic pyuria and not indicated to tx. ABD exam is nl. Skin changes are chronic so unlikely to be a source. Therefore, 2/2 Temp arteritis until proven otherwise.   3. Ectropion L lower eyelid - pt was referred to optho - will need to do this as outpt.  [facility] referral.   Burns Spain, MD 6/15/20141:02 PM

## 2012-12-14 DIAGNOSIS — R700 Elevated erythrocyte sedimentation rate: Secondary | ICD-10-CM

## 2012-12-14 DIAGNOSIS — R51 Headache: Secondary | ICD-10-CM | POA: Diagnosis present

## 2012-12-14 DIAGNOSIS — R519 Headache, unspecified: Secondary | ICD-10-CM | POA: Diagnosis present

## 2012-12-14 NOTE — Consult Note (Signed)
[facility]  CONSULT NOTE 12/14/2012 DOB: 06/21/1929 MRN : [id number]  CC:HA  Referring Physician:[name] A Butcher, MD   History of Present Illness: [name] presents with headache.  She was admitted with a headache that had last for 3 days.  She noted bitemporal location on presentation in the ED.  To me, she noted frontal distribution.  She does not note any visual changes.  While in the hospital, she presented with increased temperatures and elevated ESR.  She was started on prednisone with improvement in sx.  Per Neurology, they are concerned with possible temporal arteritis.  Past Medical History  Diagnosis Date  . Anemia   . Anxiety   . Arthritis   . GERD (gastroesophageal reflux disease)   . Hyperlipidemia   . Hypertension   . Vitamin D deficiency   . Colon polyp     2009 colonoscopy  . Hiatal hernia   . Diverticulosis   . Aortic insufficiency     mild  . Stroke 1975    Past Surgical History  Procedure Laterality Date  . Appendectomy    . Abdominal hysterectomy      Social History History  Substance Use Topics  . Smoking status: Former Smoker    Types: Cigarettes    Quit date: 07/01/1984  . Smokeless tobacco: Not on file  . Alcohol Use: No    Family History Family History  Problem Relation Age of Onset  . Stroke Mother   . Hypertension Mother   . Stroke Father   . Hypertension Father   . Diabetes Sister     No Known Allergies  Current Facility-Administered Medications  Medication Dose Route Frequency Provider Last Rate Last Dose  . aspirin EC tablet 81 mg  81 mg Oral Daily [name] A Kollar, MD   81 mg at 12/14/12 1010  . enoxaparin (LOVENOX) injection 40 mg  40 mg Subcutaneous Q24H [name] A Kollar, MD   40 mg at 12/13/12 2222  . erythromycin ophthalmic ointment 1 application  1 application Left Eye BID [name] A Kollar, MD   1 application at 12/14/12 1019  . metoprolol succinate (TOPROL-XL) 24 hr tablet 200  mg  200 mg Oral Daily [name] A Butcher, MD   200 mg at 12/14/12 1010  . oxyCODONE (Oxy IR/ROXICODONE) immediate release tablet 5 mg  5 mg Oral Q4H PRN [name] A Kollar, MD   5 mg at 12/13/12 1720  . pantoprazole (PROTONIX) EC tablet 40 mg  40 mg Oral Daily [name] A Kollar, MD   40 mg at 12/14/12 1019  . predniSONE (DELTASONE) tablet 40 mg  40 mg Oral Q breakfast Emory R McTyre, MD   40 mg at 12/14/12 0837    ROS: [x] Positive  [ ] Denies    General: [ ] Weight loss, [ ] Fever, [ ] chills Neurologic: [ ] Dizziness, [ ] Blackouts, [ ] Seizure [ ] Stroke, [ ] "Mini stroke", [ ] Slurred speech, [ ] Temporary blindness; [ ] weakness in arms or legs, [ ] Hoarseness Cardiac: [ ] Chest pain/pressure, [ ] Shortness of breath at rest [ ] Shortness of breath with exertion, [ ] Atrial fibrillation or irregular heartbeat Vascular: [ ] Pain in legs with walking, [ ] Pain in legs at rest, [ ] Pain in legs at night,  [ ] Non-healing ulcer, [ ] Blood clot in vein/DVT,   Pulmonary: [ ] Home oxygen, [ ] Productive cough, [ ] Coughing up   blood, [ ] Asthma,  [ ] Wheezing Musculoskeletal:  [x ] Arthritis, [ ] Low back pain, [x ] Joint pain Hematologic: [ ] Easy Bruising, [ ] Anemia; [ ] Hepatitis Gastrointestinal: [ ] Blood in stool, [ ] Gastroesophageal Reflux/heartburn, [ ] Trouble swallowing Urinary: [ ] chronic Kidney disease, [ ] on HD - [ ] MWF or [ ] TTHS, [ ] Burning with urination, [ ] Difficulty urinating Skin: [x ] Rashes, [ ] Wounds Psychological: [x ] Anxiety, [ ] Depression  Physical Examination Filed Vitals:   12/13/12 2249 12/14/12 0532 12/14/12 1140 12/14/12 1424  BP: 147/58 146/67 139/50 150/45  Pulse: 85 81 68 52  Temp: 100.7 F (38.2 C) 99.8 F (37.7 C) 98.8 F (37.1 C) 98 F (36.7 C)  TempSrc: Oral Oral Oral Oral  Resp: 18 18 16 20  Height:      Weight:      SpO2: 99% 95% 97% 98%   General:  WDWN in NAD, somewhat confused at times HENT: WNL Eyes: Pupils equal Ectropion  L lower eyelid she has seen an opthalmologist in the recent past. Pulmonary: normal non-labored breathing , without Rales, rhonchi,  wheezing Cardiac: RRR, without  Murmurs, rubs or gallops;No carotid bruits Abdomen: soft, NT, no masses Skin: Positive  Rashes left Shin /LE , toe nail changes consistent with onychomycosis Vascular Exam/Pulses:Radila, brachial,femoral and DP pulses palpable. Extremities without ischemic changes, no Gangrene , no cellulitis; no open wounds; Left LE skin irritation with skin sluffing. Musculoskeletal: no muscle wasting or atrophy  Neurologic: A&O X 3; Appropriate Affect ;  SENSATION: normal; MOTOR FUNCTION: Pt has good and equal strength in all extremities - 5/5; Speech is fluent/normal Psychiatry: judgment appears mostly intact even despite confusion, appropriate mood and affect  Lymph: no cervical or inguinal LAD  Laboratory: CBC:    Component Value Date/Time   WBC 7.6 12/13/2012 0515   RBC 3.24* 12/13/2012 0515   HGB 10.0* 12/13/2012 0515   HCT 29.7* 12/13/2012 0515   PLT 123* 12/13/2012 0515   MCV 91.7 12/13/2012 0515   MCH 30.9 12/13/2012 0515   MCHC 33.7 12/13/2012 0515   RDW 12.9 12/13/2012 0515   LYMPHSABS 0.7 12/12/2012 1228   MONOABS 0.9 12/12/2012 1228   EOSABS 0.1 12/12/2012 1228   BASOSABS 0.0 12/12/2012 1228    BMP:    Component Value Date/Time   NA 139 12/13/2012 0515   K 3.6 12/13/2012 0515   CL 104 12/13/2012 0515   CO2 24 12/13/2012 0515   GLUCOSE 97 12/13/2012 0515   BUN 27* 12/13/2012 0515   CREATININE 1.24* 12/13/2012 0515   CREATININE 1.10 09/18/2012 1709   CALCIUM 8.2* 12/13/2012 0515   GFRNONAA 39* 12/13/2012 0515   GFRAA 46* 12/13/2012 0515    Coagulation: Lab Results  Component Value Date   INR 1.17 12/12/2012   INR 1.10 12/02/2010   INR 1.04 12/02/2010   No results found for this basename: PTT   ESR 83  Radiology: No results found.   ASSESSMENT/PLAN: HA frontal/bitemporal area with elevated SED rate.  HA has improved since  her hospital stay and she is on Prednisone PO QD 40 mg  I will discuss possible Temporal artery biopsy with Dr. Chen  Jane [name], Jane [name] 12/14/2012 2:11 PM  Addendum  I have independently interviewed and examined the patient, and I agree with the physician assistant's findings.  Pt has history not consistent with classic temporal arteritis.  She does have a prominent temporal pulse bilaterally   and elevated ESR with improvement in atypical symptoms after starting prednisone.  Anedocately, I have never had a temporal artery biopsy come back positive, but I agree that this point probably merit it.  At this point, it is not clear which temporal artery should be biopsied.  Let us known which artery you want biopsied and we will arrange for it Wednesday (12/16/12).  Brian Chen, MD [facility] [place] Office: [phone] Pager: [phone]  12/14/2012, 9:03 PM   

## 2012-12-14 NOTE — Evaluation (Signed)
Physical Therapy Evaluation Patient Details Name: Jane [name] MRN: [id number] DOB: [birth year] Today's Date: 12/14/2012 Time: 4098-1191 PT Time Calculation (min): 24 min  PT Assessment / Plan / Recommendation Clinical Impression  Patient is an 79 y/o female admited with bilateral temporal headaches under workup for temperal arteritis.  She presents with decreased safety awareness, decreased knowledge of DME, decreased balance and will benefit from skilled PT in the acute setting to maximize safety for d/c home alone with HHPT follow up and rolling walker.    PT Assessment  Patient needs continued PT services    Follow Up Recommendations  Home health PT    Does the patient have the potential to tolerate intense rehabilitation    N/A  Barriers to Discharge  Decreased caregiver support      Equipment Recommendations  Rolling walker with 5" wheels    Recommendations for Other Services   None  Frequency Min 3X/week    Precautions / Restrictions Precautions Precautions: Fall Restrictions Weight Bearing Restrictions: No   Pertinent Vitals/Pain No pain complaints      Mobility  Bed Mobility Bed Mobility: Sit to Supine;Scooting to HOB Supine to Sit: 5: Supervision Sitting - Scoot to Edge of Bed: 5: Supervision Sit to Supine: 5: Supervision Scooting to [facility]: 4: Min assist Details for Bed Mobility Assistance: cues for positioning in bed  Transfers Sit to Stand: 5: Supervision;From bed Stand to Sit: 5: Supervision;To bed Details for Transfer Assistance: cues for safety Ambulation/Gait Ambulation/Gait Assistance: 4: Min guard Ambulation Distance (Feet): 150 Feet Assistive device: Rolling walker Ambulation/Gait Assistance Details: cues for proximity to walker, posture, eyes forward Gait Pattern: Step-through pattern;Decreased stride length;Trunk flexed;Shuffle        PT Diagnosis: Abnormality of gait  PT Problem List: Decreased knowledge of use of DME;Decreased safety  awareness;Decreased balance;Decreased mobility PT Treatment Interventions: DME instruction;Balance training;Gait training;Functional mobility training;Patient/family education;Therapeutic activities;Therapeutic exercise   PT Goals Acute Rehab PT Goals PT Goal Formulation: With patient Time For Goal Achievement: 12/28/12 Potential to Achieve Goals: Good Pt will Stand: with modified independence;with unilateral upper extremity support;3 - 5 min PT Goal: Stand - Progress: Goal set today Pt will Ambulate: >150 feet;with modified independence;with least restrictive assistive device PT Goal: Ambulate - Progress: Goal set today Pt will Perform Home Exercise Program: Independently PT Goal: Perform Home Exercise Program - Progress: Goal set today  Visit Information  Last PT Received On: 12/14/12 Assistance Needed: +1    Subjective Data  Subjective: Haven't used a walker before.  Usually look down when I walk. Patient Stated Goal: To go home tomorrow   Prior Functioning  Home Living Lives With: Alone Available Help at Discharge: Friend(s);Available PRN/intermittently Type of Home: Apartment Home Access: Elevator Home Layout: One level Bathroom Shower/Tub: Engineer, manufacturing systems: Standard Home Adaptive Equipment: Grab bars around toilet;Grab bars in shower Prior Function Level of Independence: Independent Driving: No Communication Communication: No difficulties    Cognition  Cognition Arousal/Alertness: Awake/alert Behavior During Therapy: WFL for tasks assessed/performed Overall Cognitive Status: Impaired/Different from baseline Area of Impairment: Memory Current Attention Level:  (self-distracting) Memory: Decreased short-term memory Safety/Judgement: Decreased awareness of safety    Extremity/Trunk Assessment Right Upper Extremity Assessment RUE ROM/Strength/Tone: WFL for tasks assessed (generalized weakness) Left Upper Extremity Assessment LUE ROM/Strength/Tone:  WFL for tasks assessed (generalized weakness) Right Lower Extremity Assessment RLE ROM/Strength/Tone: WFL for tasks assessed Left Lower Extremity Assessment LLE ROM/Strength/Tone: WFL for tasks assessed Trunk Assessment Trunk Assessment: Kyphotic   Balance Balance Balance Assessed:  Yes Static Standing Balance Static Standing - Balance Support: No upper extremity supported;During functional activity Static Standing - Level of Assistance: 5: Stand by assistance Static Standing - Comment/# of Minutes: standing bending to fix pad on bed  End of Session PT - End of Session Equipment Utilized During Treatment: Gait belt Activity Tolerance: Patient tolerated treatment well Patient left: in bed;with family/visitor present;with call bell/phone within reach  GP     [facility] 12/14/2012, 5:22 PM [place], [place] [phone] 12/14/2012

## 2012-12-14 NOTE — Progress Notes (Signed)
INITIAL NUTRITION ASSESSMENT  DOCUMENTATION CODES Per approved criteria  -Not Applicable   INTERVENTION: 1.  General healthful diet; encourage intake as able 2.   Supplements; Ensure Complete once daily.  MD please include on discharge medication list.   NUTRITION DIAGNOSIS: Inadequate oral intake related to headache as evidenced by pt report.   Monitor:  1.  Food/Beverage; improvement in intake to >/=50% of meals to meet>/=90% estimated needs. Pt consuming Ensure daily.   Reason for Assessment: consult  79 y.o. female  Admitting Dx: Dehydration  ASSESSMENT: Pt admitted with headache and poor appetite x3 days.  Pt reports her intake was less than usual PTA. Pt is somewhat of a poor historian, however is able to state how she obtains and stores her groceries.  Pt reports that she usually write a list and has her brother pick up items from store. She occasionally has friends or family who stop by and bring fast food.  Pt is able to cook for herself if she wants, however she typically goes for sandwiches, soups, and snacks.    Nutrition Focused Physical Exam:  Subcutaneous Fat:  Orbital Region: wnl Upper Arm Region: wnl Thoracic and Lumbar Region: wnl  Muscle:  Temple Region: wnl Clavicle Bone Region: wnl Clavicle and Acromion Bone Region: wnl Scapular Bone Region: n/a Dorsal Hand: moderate Patellar Region: n/a Anterior Thigh Region: n/a Posterior Calf Region: n/a  Edema:  None present  Height: Ht Readings from Last 1 Encounters:  12/13/12 5' (1.524 m)    Weight: Wt Readings from Last 1 Encounters:  12/13/12 180 lb (81.647 kg)    Ideal Body Weight: 100 lbs  % Ideal Body Weight: 180%  Wt Readings from Last 10 Encounters:  12/13/12 180 lb (81.647 kg)  09/18/12 180 lb 1.6 oz (81.693 kg)  11/07/10 177 lb 6.4 oz (80.468 kg)  04/25/08 164 lb 1.6 oz (74.435 kg)  04/18/08 164 lb 1.6 oz (74.435 kg)  01/07/08 158 lb 8 oz (71.895 kg)  11/27/07 158 lb (71.668 kg)   10/29/07 154 lb (69.854 kg)  08/26/07 155 lb 12.8 oz (70.67 kg)  12/24/06 159 lb (72.122 kg)    Usual Body Weight: 155-180 lbs  % Usual Body Weight: 100%  BMI:  Body mass index is 35.15 kg/(m^2).  Estimated Nutritional Needs: Kcal: 1350-1500 Protein: 50-60g Fluid: ~1.5 L/day  Skin: intact  Diet Order: General  EDUCATION NEEDS: -Education needs addressed   Intake/Output Summary (Last 24 hours) at 12/14/12 1403 Last data filed at 12/13/12 1500  Gross per 24 hour  Intake    360 ml  Output      0 ml  Net    360 ml    Last BM: 6/13  Labs:   Recent Labs Lab 12/12/12 1228 12/13/12 0515  NA 138 139  K 3.8 3.6  CL 97 104  CO2 23 24  BUN 34* 27*  CREATININE 1.33* 1.24*  CALCIUM 9.1 8.2*  GLUCOSE 115* 97    CBG (last 3)  No results found for this basename: GLUCAP,  in the last 72 hours  Scheduled Meds: . aspirin EC  81 mg Oral Daily  . enoxaparin (LOVENOX) injection  40 mg Subcutaneous Q24H  . erythromycin  1 application Left Eye BID  . metoprolol succinate  200 mg Oral Daily  . pantoprazole  40 mg Oral Daily  . predniSONE  40 mg Oral Q breakfast    Continuous Infusions:   Past Medical History  Diagnosis Date  . Anemia   .  Anxiety   . Arthritis   . GERD (gastroesophageal reflux disease)   . Hyperlipidemia   . Hypertension   . Vitamin D deficiency   . Colon polyp     2009 colonoscopy  . Hiatal hernia   . Diverticulosis   . Aortic insufficiency     mild  . Stroke 1975    Past Surgical History  Procedure Laterality Date  . Appendectomy    . Abdominal hysterectomy      Loyce Dys, MS RD LDN Clinical Inpatient Dietitian Pager: [phone] Weekend/After hours pager: [phone]

## 2012-12-14 NOTE — Progress Notes (Signed)
Internal Medicine Teaching Service Attending Note Date: 12/14/2012  Patient name: Jane [name]  Medical record number: [id number]  Date of birth: [birth year]  I have seen the patient this morning with my resident team, and I have discussed the plan of care with them.   In brief, Jane [name] come in with temporal headache and generalized weakness. She is currently being treated for dehydration and temporal arteritis, and we have asked neurology to consult on this. The patient feels better since the start of prednisone. Her CT Head did not show any significant abnormalities.   The patient also spiked fevers on the past two days with no other associated symptoms. She has so far denied an LP and in the morning mentioned that she would talk to her family about this.     She  has a past medical history of Anemia; Anxiety; Arthritis; GERD (gastroesophageal reflux disease); Hyperlipidemia; Hypertension; Vitamin D deficiency; Colon polyp; Hiatal hernia; Diverticulosis; Aortic insufficiency; and Stroke (1975).  Marland Kitchen aspirin EC  81 mg Oral Daily  . enoxaparin (LOVENOX) injection  40 mg Subcutaneous Q24H  . erythromycin  1 application Left Eye BID  . metoprolol succinate  200 mg Oral Daily  . pantoprazole  40 mg Oral Daily  . predniSONE  40 mg Oral Q breakfast   Filed Vitals:   12/13/12 2249 12/14/12 0532 12/14/12 1140 12/14/12 1424  BP: 147/58 146/67 139/50 150/45  Pulse: 85 81 68 52  Temp: 100.7 F (38.2 C) 99.8 F (37.7 C) 98.8 F (37.1 C) 98 F (36.7 C)  TempSrc: Oral Oral Oral Oral  Resp: 18 18 16 20   Height:      Weight:      SpO2: 99% 95% 97% 98%   Vitals reviewed.  General: Resting in bed. HEENT: Ectropian left lower eyelid, with erythema, PERRL, EOMI, no scleral icterus. Heart: RRR, no rubs, murmurs or gallops. Lungs: Clear to auscultation bilaterally, no wheezes, rales, or rhonchi. Abdomen: Soft, nontender, nondistended, BS present. Extremities: Warm, no pedal edema. Neuro:  Alert and oriented X3, cranial nerves II-XII grossly intact,  strength and sensation to light touch equal in bilateral upper and lower extremities    Recent Labs Lab 12/12/12 1228 12/13/12 0515  HGB 11.8* 10.0*  HCT 33.8* 29.7*  WBC 8.4 7.6  PLT 136* 123*    Recent Labs Lab 12/12/12 1228 12/13/12 0515  NA 138 139  K 3.8 3.6  CL 97 104  CO2 23 24  GLUCOSE 115* 97  BUN 34* 27*  CREATININE 1.33* 1.24*  CALCIUM 9.1 8.2*    Recent Labs Lab 12/12/12 1228 12/13/12 0515  AST 20 23  ALT 12 12  ALKPHOS 65 51  BILITOT 0.9 0.6  PROT 8.3 6.8  ALBUMIN 3.4* 2.7*  INR 1.17  --    Sed rate - 83  Assessment and Plan    Headache and Fever, high ESR - Temporal Arteritis, presumed - Patient feels better on prednisone. Neurology consulted. We will go ahead with temporal artery biopsy with the patient's permission.    Given her fever with headache, with generalized malaise symptoms, and no other signs of infection, a mild presentation of viral encephalitis/meningitis could be a differential, however, she does not have any neck findings, focal weakness nor does she have altered mental status. She refuses LP even after multiple explanations. UA is negative.     Generalized Weakness - likely secondary to poor PO intake. The patient's BUN/Cr is improving after resuscitation with saline. The patient  was not orthostatic.   Rest per resident note.   Aletta Edouard 12/14/2012, 2:58 PM.

## 2012-12-14 NOTE — Consult Note (Signed)
NEURO HOSPITALIST CONSULT NOTE    Reason for Consult: HA in setting of elevated Sed rate  HPI:                                                                                                                                          Jane [name] is an 79 y.o. female who has had HA in the past but never diagnosed with migraines. Patietn states three days ago she had a HA. She is a poor historian and cannot describe her HA well. She states it started out bilateral frontal region but was not pounding or lancating.  She had no tenderness to palpation of her scalp or sides of her head. This HA was off and on but seemed to dissipate once admitted to the hospital.  She denies any dysarthria, diplopia or blurred vision.  There is report of neck pain in the chart, but patient denies any neck pain or stiffness.  She admits to having a sore neck when reading for prolonged periods of time but currently is having no problems with this.  She has no neck stiffness or pain when looking from side to side or up and down.   Neurology was consulted due to patients initial complaint of HA in the setting of elevated ESR  84 and initial Tmax of 102.  Since admission she has had 3 recording of LG fever (99, 100.7, 99.8) and no elevated WBC.  UA and CXR have been unrevealing. Marland Kitchen  She was admitted on 6.14.14 and steroids for possible temporal arteritis were started today at 0005.    Past Medical History  Diagnosis Date  . Anemia   . Anxiety   . Arthritis   . GERD (gastroesophageal reflux disease)   . Hyperlipidemia   . Hypertension   . Vitamin D deficiency   . Colon polyp     2009 colonoscopy  . Hiatal hernia   . Diverticulosis   . Aortic insufficiency     mild  . Stroke 1975    Past Surgical History  Procedure Laterality Date  . Appendectomy    . Abdominal hysterectomy      Family History  Problem Relation Age of Onset  . Stroke Mother   . Hypertension Mother   . Stroke Father    . Hypertension Father   . Diabetes Sister      Social History:  reports that she quit smoking about 28 years ago. Her smoking use included Cigarettes. She smoked 0.00 packs per day. She does not have any smokeless tobacco history on file. She reports that she does not drink alcohol or use illicit drugs.  No Known Allergies  MEDICATIONS:  Prior to Admission:  Prescriptions prior to admission  Medication Sig Dispense Refill  . aspirin EC 81 MG tablet Take 81 mg by mouth daily.      . chlorthalidone (HYGROTON) 25 MG tablet Take 25 mg by mouth daily.      Marland Kitchen erythromycin ophthalmic ointment Place 1 application into the left eye See admin instructions. Instill 1/2" (1.25cm) two to four times daily for 5 to 7 days.      . Ferrous Gluconate (FERATE) 256 (28 FE) MG TABS Take 256 mg by mouth daily.  30 tablet  3  . losartan (COZAAR) 50 MG tablet Take 1 tablet (50 mg total) by mouth daily.  90 tablet  3  . metoprolol (TOPROL-XL) 200 MG 24 hr tablet Take 200 mg by mouth daily.      Marland Kitchen omeprazole (PRILOSEC) 20 MG capsule Take 20 mg by mouth daily. Will sometimes take twice daily if heartburn increases.       Scheduled: . aspirin EC  81 mg Oral Daily  . enoxaparin (LOVENOX) injection  40 mg Subcutaneous Q24H  . erythromycin  1 application Left Eye BID  . metoprolol succinate  200 mg Oral Daily  . pantoprazole  40 mg Oral Daily  . predniSONE  40 mg Oral Q breakfast     ROS:                                                                                                                                       History obtained from the patient  General ROS: negative for - chills, fatigue, fever, night sweats, weight gain or weight loss Psychological ROS: negative for - behavioral disorder, hallucinations, memory difficulties, mood swings or suicidal ideation Ophthalmic ROS: negative for  - blurry vision, double vision, eye pain or loss of vision ENT ROS: negative for - epistaxis, nasal discharge, oral lesions, sore throat, tinnitus or vertigo Allergy and Immunology ROS: negative for - hives or itchy/watery eyes Hematological and Lymphatic ROS: negative for - bleeding problems, bruising or swollen lymph nodes Endocrine ROS: negative for - galactorrhea, hair pattern changes, polydipsia/polyuria or temperature intolerance Respiratory ROS: negative for - cough, hemoptysis, shortness of breath or wheezing Cardiovascular ROS: negative for - chest pain, dyspnea on exertion, edema or irregular heartbeat Gastrointestinal ROS: negative for - abdominal pain, diarrhea, hematemesis, nausea/vomiting or stool incontinence Genito-Urinary ROS: negative for - dysuria, hematuria, incontinence or urinary frequency/urgency Musculoskeletal ROS: negative for - joint swelling or muscular weakness Neurological ROS: as noted in HPI Dermatological ROS: negative for rash and skin lesion changes   Blood pressure 139/50, pulse 68, temperature 98.8 F (37.1 C), temperature source Oral, resp. rate 16, height 5' (1.524 m), weight 81.647 kg (180 lb), SpO2 97.00%.   Neurologic Examination:  Mental Status: Alert, oriented.  Speech fluent without evidence of aphasia.  Able to follow 3 step commands without difficulty. Cranial Nerves: II: Discs flat bilaterally; Visual fields grossly normal, pupils equal, round, reactive to light and accommodation III,IV, VI: Ectropion Left lower lid, extra-ocular motions intact bilaterally V,VII: smile symmetric, facial light touch sensation normal bilaterally VIII: hearing normal bilaterally IX,X: gag reflex present XI: bilateral shoulder shrug XII: midline tongue extension --no nuchal rigidity, Lerhmits  And neck is supple.  Motor: Right : Upper extremity   5/5    Left:      Upper extremity   5/5  Lower extremity   5/5     Lower extremity   5/5 Tone and bulk:normal tone throughout; no atrophy noted Sensory: Pinprick and light touch intact throughout, bilaterally but decreased bilateral LE from calf to foot due to sever PVD Deep Tendon Reflexes: 2+ and symmetric throughout UE and bilateral KJ, no AJ Plantars: Mute bilaterally Cerebellar: normal finger-to-nose,  CV: pulses palpable throughout    Lab Results  Component Value Date/Time   CHOL 202* 04/18/2008  9:24 PM    Results for orders placed during the hospital encounter of 12/12/12 (from the past 48 hour(s))  CBC WITH DIFFERENTIAL     Status: Abnormal   Collection Time    12/12/12 12:28 PM      Result Value Range   WBC 8.4  4.0 - 10.5 K/uL   RBC 3.69 (*) 3.87 - 5.11 MIL/uL   Hemoglobin 11.8 (*) 12.0 - 15.0 g/dL   HCT 16.1 (*) 09.6 - 04.5 %   MCV 91.6  78.0 - 100.0 fL   MCH 32.0  26.0 - 34.0 pg   MCHC 34.9  30.0 - 36.0 g/dL   RDW 40.9  81.1 - 91.4 %   Platelets 136 (*) 150 - 400 K/uL   Neutrophils Relative % 80 (*) 43 - 77 %   Neutro Abs 6.7  1.7 - 7.7 K/uL   Lymphocytes Relative 9 (*) 12 - 46 %   Lymphs Abs 0.7  0.7 - 4.0 K/uL   Monocytes Relative 11  3 - 12 %   Monocytes Absolute 0.9  0.1 - 1.0 K/uL   Eosinophils Relative 1  0 - 5 %   Eosinophils Absolute 0.1  0.0 - 0.7 K/uL   Basophils Relative 0  0 - 1 %   Basophils Absolute 0.0  0.0 - 0.1 K/uL  COMPREHENSIVE METABOLIC PANEL     Status: Abnormal   Collection Time    12/12/12 12:28 PM      Result Value Range   Sodium 138  135 - 145 mEq/L   Potassium 3.8  3.5 - 5.1 mEq/L   Chloride 97  96 - 112 mEq/L   CO2 23  19 - 32 mEq/L   Glucose, Bld 115 (*) 70 - 99 mg/dL   BUN 34 (*) 6 - 23 mg/dL   Creatinine, Ser 7.82 (*) 0.50 - 1.10 mg/dL   Calcium 9.1  8.4 - 95.6 mg/dL   Total Protein 8.3  6.0 - 8.3 g/dL   Albumin 3.4 (*) 3.5 - 5.2 g/dL   AST 20  0 - 37 U/L   ALT 12  0 - 35 U/L   Alkaline Phosphatase 65  39 - 117 U/L   Total  Bilirubin 0.9  0.3 - 1.2 mg/dL   GFR calc non Af Amer 36 (*) >90 mL/min   GFR calc Af Amer 42 (*) >90 mL/min  Comment:            The eGFR has been calculated     using the CKD EPI equation.     This calculation has not been     validated in all clinical     situations.     eGFR's persistently     <90 mL/min signify     possible Chronic Kidney Disease.  PROCALCITONIN     Status: None   Collection Time    12/12/12 12:28 PM      Result Value Range   Procalcitonin 0.19     Comment:            Interpretation:     PCT (Procalcitonin) <= 0.5 ng/mL:     Systemic infection (sepsis) is not likely.     Local bacterial infection is possible.     (NOTE)             ICU PCT Algorithm               Non ICU PCT Algorithm        ----------------------------     ------------------------------             PCT < 0.25 ng/mL                 PCT < 0.1 ng/mL         Stopping of antibiotics            Stopping of antibiotics           strongly encouraged.               strongly encouraged.        ----------------------------     ------------------------------           PCT level decrease by               PCT < 0.25 ng/mL           >= 80% from peak PCT           OR PCT 0.25 - 0.5 ng/mL          Stopping of antibiotics                                                 encouraged.         Stopping of antibiotics               encouraged.        ----------------------------     ------------------------------           PCT level decrease by              PCT >= 0.25 ng/mL           < 80% from peak PCT            AND PCT >= 0.5 ng/mL            Continuing antibiotics                                                  encouraged.           Continuing antibiotics  encouraged.        ----------------------------     ------------------------------         PCT level increase compared          PCT > 0.5 ng/mL             with peak PCT AND              PCT >= 0.5 ng/mL             Escalation of  antibiotics                                              strongly encouraged.          Escalation of antibiotics            strongly encouraged.  PROTIME-INR     Status: None   Collection Time    12/12/12 12:28 PM      Result Value Range   Prothrombin Time 14.7  11.6 - 15.2 seconds   INR 1.17  0.00 - 1.49  LIPASE, BLOOD     Status: None   Collection Time    12/12/12 12:28 PM      Result Value Range   Lipase 19  11 - 59 U/L  POCT I-STAT TROPONIN I     Status: None   Collection Time    12/12/12 12:55 PM      Result Value Range   Troponin i, poc 0.06  0.00 - 0.08 ng/mL   Comment 3            Comment: Due to the release kinetics of cTnI,     a negative result within the first hours     of the onset of symptoms does not rule out     myocardial infarction with certainty.     If myocardial infarction is still suspected,     repeat the test at appropriate intervals.  CG4 I-STAT (LACTIC ACID)     Status: None   Collection Time    12/12/12 12:57 PM      Result Value Range   Lactic Acid, Venous 2.19  0.5 - 2.2 mmol/L  URINALYSIS, ROUTINE W REFLEX MICROSCOPIC     Status: Abnormal   Collection Time    12/12/12  3:18 PM      Result Value Range   Color, Urine YELLOW  YELLOW   APPearance CLEAR  CLEAR   Specific Gravity, Urine 1.022  1.005 - 1.030   pH 5.0  5.0 - 8.0   Glucose, UA NEGATIVE  NEGATIVE mg/dL   Hgb urine dipstick MODERATE (*) NEGATIVE   Bilirubin Urine SMALL (*) NEGATIVE   Ketones, ur NEGATIVE  NEGATIVE mg/dL   Protein, ur >161 (*) NEGATIVE mg/dL   Urobilinogen, UA 1.0  0.0 - 1.0 mg/dL   Nitrite NEGATIVE  NEGATIVE   Leukocytes, UA NEGATIVE  NEGATIVE  URINE CULTURE     Status: None   Collection Time    12/12/12  3:18 PM      Result Value Range   Specimen Description URINE, CATHETERIZED     Special Requests NONE     Culture  Setup Time 12/12/2012 21:33     Colony Count NO GROWTH     Culture NO GROWTH     Report Status 12/13/2012 FINAL    URINE MICROSCOPIC-ADD ON  Status: Abnormal   Collection Time    12/12/12  3:18 PM      Result Value Range   Squamous Epithelial / LPF RARE  RARE   RBC / HPF 3-6  <3 RBC/hpf   Casts GRANULAR CAST (*) NEGATIVE   Crystals URIC ACID CRYSTALS (*) NEGATIVE  COMPREHENSIVE METABOLIC PANEL     Status: Abnormal   Collection Time    12/13/12  5:15 AM      Result Value Range   Sodium 139  135 - 145 mEq/L   Potassium 3.6  3.5 - 5.1 mEq/L   Chloride 104  96 - 112 mEq/L   CO2 24  19 - 32 mEq/L   Glucose, Bld 97  70 - 99 mg/dL   BUN 27 (*) 6 - 23 mg/dL   Creatinine, Ser 4.09 (*) 0.50 - 1.10 mg/dL   Calcium 8.2 (*) 8.4 - 10.5 mg/dL   Total Protein 6.8  6.0 - 8.3 g/dL   Albumin 2.7 (*) 3.5 - 5.2 g/dL   AST 23  0 - 37 U/L   ALT 12  0 - 35 U/L   Alkaline Phosphatase 51  39 - 117 U/L   Total Bilirubin 0.6  0.3 - 1.2 mg/dL   GFR calc non Af Amer 39 (*) >90 mL/min   GFR calc Af Amer 46 (*) >90 mL/min   Comment:            The eGFR has been calculated     using the CKD EPI equation.     This calculation has not been     validated in all clinical     situations.     eGFR's persistently     <90 mL/min signify     possible Chronic Kidney Disease.  CBC     Status: Abnormal   Collection Time    12/13/12  5:15 AM      Result Value Range   WBC 7.6  4.0 - 10.5 K/uL   RBC 3.24 (*) 3.87 - 5.11 MIL/uL   Hemoglobin 10.0 (*) 12.0 - 15.0 g/dL   HCT 81.1 (*) 91.4 - 78.2 %   MCV 91.7  78.0 - 100.0 fL   MCH 30.9  26.0 - 34.0 pg   MCHC 33.7  30.0 - 36.0 g/dL   RDW 95.6  21.3 - 08.6 %   Platelets 123 (*) 150 - 400 K/uL  SEDIMENTATION RATE     Status: Abnormal   Collection Time    12/13/12  5:15 AM      Result Value Range   Sed Rate 83 (*) 0 - 22 mm/hr  TSH     Status: None   Collection Time    12/13/12  5:15 AM      Result Value Range   TSH 0.565  0.350 - 4.500 uIU/mL  SODIUM, URINE, RANDOM     Status: None   Collection Time    12/13/12  7:56 AM      Result Value Range   Sodium, Ur 114    CREATININE, URINE, RANDOM      Status: None   Collection Time    12/13/12  7:56 AM      Result Value Range   Creatinine, Urine 117.27      X-ray Chest Pa And Lateral   12/12/2012   **ADDENDUM** CREATED: 12/12/2012 20:52:29  On the lateral views, there is no airspace consolidation. Therefore, the increased density at the left lateral lung base on the  frontal view represented overlapping of the heart and soft tissues.  **END ADDENDUM** SIGNED BY: Londell Moh. Azucena Kuba, M.D.  12/12/2012   *RADIOLOGY REPORT*  Clinical Data: Shortness of breath.  CHEST - 2 VIEW  Comparison: Earlier today.  Findings: Stable enlargement of the cardiac silhouette and prominence of the pulmonary vasculature and interstitial markings. Increased density at the left lateral lung base.  Diffuse osteopenia.  IMPRESSION:  1.  Increased density at the left lateral lung base, most likely representing atelectasis and superimposed soft tissues. 2.  Stable cardiomegaly, pulmonary vascular congestion and changes of COPD.   Original Report Authenticated By: Beckie Salts, M.D.   Ct Head Wo Contrast  12/12/2012   *RADIOLOGY REPORT*  Clinical Data: Severe headache.  Tachycardia.  Anemia. Hypertension.  Previous stroke.  CT HEAD WITHOUT CONTRAST  Technique:  Contiguous axial images were obtained from the base of the skull through the vertex without contrast.  Comparison: 12/19/2010  Findings: There is no evidence of intracranial hemorrhage, brain edema or other signs of acute infarction.  There is no evidence of intracranial mass lesion or mass effect.  No abnormal extra-axial fluid collections are identified.  Mild to moderate cerebral atrophy is stable.  Moderate chronic small vessel disease is also stable in appearance.  Ventricles are normal in size.  No skull abnormality identified.  IMPRESSION:  1.  No acute intracranial findings. 2.  Stable cerebral atrophy and chronic small vessel disease.   Original Report Authenticated By: Myles Rosenthal, M.D.   Dg Chest Portable 1  View  12/12/2012   *RADIOLOGY REPORT*  Clinical Data: Headache, shortness of breath  PORTABLE CHEST - 1 VIEW  Comparison: 12/05/2010  Findings: Cardiomegaly again noted.  No acute infiltrate or pleural effusion.  No pulmonary edema.  Mild hyperinflation again noted.  IMPRESSION: Cardiomegaly.  Mild hyperinflation.  No active disease.   Original Report Authenticated By: Natasha Mead, M.D.     Assessment/Plan: 79 YO female with 3 day history of HA, fluctuating lowe grade fever and SED rate of 83.  Currently patient is no longer having complaints of HA however she also has recently been placed on Prednisone 40 mg daily.  Exam shows no nuchal rigidity or tenderness to her scalp or temporal region.  However, given patients age, HA and elevated Sed rate Temporal Arteritis is a concern.     Recommend:  1) Continue Steroids 2) Agree with obtaining a Temporal artery biopsy.  3) neurology will continue to follow with you     Felicie Morn PA-C [facility] [phone]  I personally participated in this patient's evaluation and management as well as formulated the above clinical assessment and management recommendations.   Venetia Maxon M.D.  [facility]  [phone]   12/14/2012, 12:21 PM

## 2012-12-14 NOTE — Progress Notes (Signed)
Subjective:    [name] was seen and examined at bedside this morning.  She reports mild improvement in her headache today, but claiming it starts today at the base of her neck and extending up.  Today, she denies any temporal pain.  She denies any new vision disturbance, N/V/D, abdominal pain, chest pain, or shortness of breat at this time.  Tmax 100.7 last night.       Objective:    Vital Signs:   Temp:  [99 F (37.2 C)-100.7 F (38.2 C)] 99.8 F (37.7 C) (06/16 0532) Pulse Rate:  [81-98] 81 (06/16 0532) Resp:  [18] 18 (06/16 0532) BP: (146-154)/(58-80) 146/67 mmHg (06/16 0532) SpO2:  [95 %-99 %] 95 % (06/16 0532) Last BM Date: 12/11/12  24-hour weight change: Weight change:   Intake/Output:   Intake/Output Summary (Last 24 hours) at 12/14/12 1116 Last data filed at 12/13/12 1500  Gross per 24 hour  Intake    360 ml  Output      0 ml  Net    360 ml     Physical Exam: General: Vital signs reviewed and noted. Well-developed, well-nourished, in no acute distress; alert, appropriate and cooperative throughout examination.  HEENT:  Everted L lower eyelid, erythema. No tenderness to palpation of temporal regions.  Lungs:  Normal respiratory effort. Clear to auscultation BL without crackles or wheezes.  Heart: RRR. S1 and S2 normal without gallop, murmur, or rubs.  Abdomen:  BS normoactive. Soft, Nondistended, non-tender.  No masses or organomegaly.  Extremities: No pretibial edema. Skin changes consistent with chronic venous disease. Multiple scars and excoriations    Labs:  Basic Metabolic Panel:  Recent Labs Lab 12/12/12 1228 12/13/12 0515  NA 138 139  K 3.8 3.6  CL 97 104  CO2 23 24  GLUCOSE 115* 97  BUN 34* 27*  CREATININE 1.33* 1.24*  CALCIUM 9.1 8.2*    Liver Function Tests:  Recent Labs Lab 12/12/12 1228 12/13/12 0515  AST 20 23  ALT 12 12  ALKPHOS 65 51  BILITOT 0.9 0.6  PROT 8.3 6.8  ALBUMIN 3.4* 2.7*    Recent Labs Lab  12/12/12 1228  LIPASE 19    CBC:  Recent Labs Lab 12/12/12 1228 12/13/12 0515  WBC 8.4 7.6  NEUTROABS 6.7  --   HGB 11.8* 10.0*  HCT 33.8* 29.7*  MCV 91.6 91.7  PLT 136* 123*   Microbiology: Results for orders placed during the hospital encounter of 12/12/12  CULTURE, BLOOD (ROUTINE X 2)     Status: None   Collection Time    12/12/12 11:40 AM      Result Value Range Status   Specimen Description BLOOD RIGHT ARM   Final   Special Requests BOTTLES DRAWN AEROBIC AND ANAEROBIC 10CC   Final   Culture  Setup Time 12/12/2012 18:52   Final   Culture     Final   Value:        BLOOD CULTURE RECEIVED NO GROWTH TO DATE CULTURE WILL BE HELD FOR 5 DAYS BEFORE ISSUING A FINAL NEGATIVE REPORT   Report Status PENDING   Incomplete  CULTURE, BLOOD (ROUTINE X 2)     Status: None   Collection Time    12/12/12 11:42 AM      Result Value Range Status   Specimen Description BLOOD LEFT ARM   Final   Special Requests BOTTLES DRAWN AEROBIC ONLY 10CC   Final   Culture  Setup Time 12/12/2012 18:52  Final   Culture     Final   Value:        BLOOD CULTURE RECEIVED NO GROWTH TO DATE CULTURE WILL BE HELD FOR 5 DAYS BEFORE ISSUING A FINAL NEGATIVE REPORT   Report Status PENDING   Incomplete  URINE CULTURE     Status: None   Collection Time    12/12/12  3:18 PM      Result Value Range Status   Specimen Description URINE, CATHETERIZED   Final   Special Requests NONE   Final   Culture  Setup Time 12/12/2012 21:33   Final   Colony Count NO GROWTH   Final   Culture NO GROWTH   Final   Report Status 12/13/2012 FINAL   Final   Coagulation Studies:  Recent Labs  12/12/12 1228  LABPROT 14.7  INR 1.17    Imaging: X-ray Chest Pa And Lateral   12/12/2012   **ADDENDUM** CREATED: 12/12/2012 20:52:29  On the lateral views, there is no airspace consolidation. Therefore, the increased density at the left lateral lung base on the frontal view represented overlapping of the heart and soft tissues.  **END  ADDENDUM** SIGNED BY: Londell Moh. Azucena Kuba, M.D.  12/12/2012   *RADIOLOGY REPORT*  Clinical Data: Shortness of breath.  CHEST - 2 VIEW  Comparison: Earlier today.  Findings: Stable enlargement of the cardiac silhouette and prominence of the pulmonary vasculature and interstitial markings. Increased density at the left lateral lung base.  Diffuse osteopenia.  IMPRESSION:  1.  Increased density at the left lateral lung base, most likely representing atelectasis and superimposed soft tissues. 2.  Stable cardiomegaly, pulmonary vascular congestion and changes of COPD.   Original Report Authenticated By: Beckie Salts, M.D.   Ct Head Wo Contrast  12/12/2012   *RADIOLOGY REPORT*  Clinical Data: Severe headache.  Tachycardia.  Anemia. Hypertension.  Previous stroke.  CT HEAD WITHOUT CONTRAST  Technique:  Contiguous axial images were obtained from the base of the skull through the vertex without contrast.  Comparison: 12/19/2010  Findings: There is no evidence of intracranial hemorrhage, brain edema or other signs of acute infarction.  There is no evidence of intracranial mass lesion or mass effect.  No abnormal extra-axial fluid collections are identified.  Mild to moderate cerebral atrophy is stable.  Moderate chronic small vessel disease is also stable in appearance.  Ventricles are normal in size.  No skull abnormality identified.  IMPRESSION:  1.  No acute intracranial findings. 2.  Stable cerebral atrophy and chronic small vessel disease.   Original Report Authenticated By: Myles Rosenthal, M.D.   Dg Chest Portable 1 View  12/12/2012   *RADIOLOGY REPORT*  Clinical Data: Headache, shortness of breath  PORTABLE CHEST - 1 VIEW  Comparison: 12/05/2010  Findings: Cardiomegaly again noted.  No acute infiltrate or pleural effusion.  No pulmonary edema.  Mild hyperinflation again noted.  IMPRESSION: Cardiomegaly.  Mild hyperinflation.  No active disease.   Original Report Authenticated By: Natasha Mead, M.D.     Medications:     Infusions:    Scheduled Medications: . aspirin EC  81 mg Oral Daily  . enoxaparin (LOVENOX) injection  40 mg Subcutaneous Q24H  . erythromycin  1 application Left Eye BID  . metoprolol succinate  200 mg Oral Daily  . pantoprazole  40 mg Oral Daily  . predniSONE  40 mg Oral Q breakfast   PRN Medications: oxyCODONE  Assessment/ Plan:   Ms. Suttles is a 80 year old female with PMH  of anemia, GERD, HTN, and Diverticulosis presenting with fever of unknown origin and headache.  CXR negative for airspace consolidation. CT head negative for any acute intracranial findings with stable cerebral atrophy and chronic small vessel disease. ?temporal arteritis  Headache and Fever - Tmax = 102.2 during admission and 100.47F over night.  Initially noted to have tachycardia (HR ~120s now resolved). No leukocytosis. No identifiable source of infection. 2v CXR unrevealing of pneumonia (results discussed with radiology given presence of atelectasis). UA unrevealing of leukocytes or nitrites and asymptomatic. Initially complained consistently of temporal headache with fever, elevated ESR 83 with absence of other symptoms or a source of infection, and denies vision change; concern for temporal arteritis and started on prednisone 12/13/12. - consulted neurology, discussed with PA Katrinka Blazing, will see; ?LP - prednisone 40mg  QD (x2-4 weeks followed by taper) - blood cx x2 NGTD - pending neurology consultation, will then look into need for vascular surgery for further consideration of temporal artery biopsy  Dehydration--initially with decreased PO intake and tachycardia, improved with fluids (2L bolus in ED and NS @125cc /hr). Pt insists she cares for herself and cooks for herself at home, however her family members are concerned about her ability to do so on her own. Will evaluate further on this issue to ensure pt is able to safely care for herself and cook regularly enough to provide herself with adequate nutrition to  help avoid similar future episodes.  - PT/OT eval  - no orthostatics noted yesterday, BP 162/86 sitting with HR 90 and BP 169/99 with HR 99 standing -Cr improving, down to 1.24 on 6/15  Ectropion L lower eyelid--everted lower eyelid. Pt unable to give an accurate history as to when this issue began, however it appears it has been present and unchanged for at least 5-6 months. Reports fall at least 1 year ago resulting in "bump" on left eye but says eyelid change happened after the injury.  She has been prescribed erythromycin ophthalmic ointment for possible infection associated with the issue, however there does not appear to be any clear signs of infection at time of admission.  - cont erythro ophthalmic ointment   Hx stroke - remote (1975). -continue ASA at this time.    DVT PPX - lovenox  CODE STATUS - full  CONSULTS PLACED -  DISPO - Disposition is deferred at this time, awaiting improvement of current medical problems.   Anticipated discharge in approximately 1-2 day(s).   The patient does have a current PCP (Bosie Clos, KAREN, MD) and does need an [facility] hospital follow-up appointment after discharge.    Does the patient have transportation limitations that hinder transportation to clinic appointments? unknown   SERVICE NEEDED AT DISCHARGE - TO BE DETERMINED DURING HOSPITAL COURSE         Y = Yes, Blank = No PT:   OT:   RN:   Equipment:   Other:     Length of Stay: 2 day(s)   Signed: Darden Palmer, MD  PGY-1, Internal Medicine Resident Pager: [phone] (7AM-5PM) 12/14/2012, 11:16 AM

## 2012-12-14 NOTE — Evaluation (Addendum)
Occupational Therapy Evaluation Patient Details Name: Jane [name] MRN: [id number] DOB: 04/11/1929 Today's Date: 12/14/2012 Time: 7425-9563 OT Time Calculation (min): 46 min  OT Assessment / Plan / Recommendation Clinical Impression   79 y.o. Admitted with headache and dehydration. Pt presents with below problem list. Pt will benefit from acute OT to increase independence prior to d/c.      OT Assessment  Patient needs continued OT Services    Follow Up Recommendations  Home health OT;Supervision/Assistance - 24 hour    Barriers to Discharge Decreased caregiver support    Equipment Recommendations  Other (comment) (tbd)    Recommendations for Other Services    Frequency  Min 2X/week    Precautions / Restrictions Precautions Precautions: Fall Restrictions Weight Bearing Restrictions: No   Pertinent Vitals/Pain No pain reported.     ADL  Eating/Feeding: Independent;Performed Where Assessed - Eating/Feeding: Bed level Grooming: Performed;Wash/dry hands;Min guard Where Assessed - Grooming: Unsupported standing Upper Body Bathing: Set up Where Assessed - Upper Body Bathing: Supported sitting Lower Body Bathing: Minimal assistance Where Assessed - Lower Body Bathing: Supported sit to stand Upper Body Dressing: Set up Where Assessed - Upper Body Dressing: Supported sitting Lower Body Dressing: Minimal assistance Where Assessed - Lower Body Dressing: Supported sit to Pharmacist, hospital: Performed;Min guard Statistician Method: Sit to Barista: Comfort height toilet;Grab bars Toileting - Architect and Hygiene: Performed;Min guard;Supervision/safety (supervision-hygiene and minguard-clothing) Where Assessed - Toileting Clothing Manipulation and Hygiene: Sit on 3-in-1 or toilet;Sit to stand from 3-in-1 or toilet Tub/Shower Transfer Method: Not assessed Equipment Used: Gait belt;Rolling walker Transfers/Ambulation Related to ADLs:  Minguard. Did require several cues and assistance for walker safety ADL Comments: Pt performed toileting tasks and grooming at sink- Minguard assist. Requiring several cues for walker safety during session. OT did attempt to have pt walk without walker and pt was furniture walking.  Pt with decreased activity tolerance as she was out of breath at end of session.    OT Diagnosis: Generalized weakness  OT Problem List: Decreased strength;Decreased activity tolerance;Impaired balance (sitting and/or standing);Decreased knowledge of use of DME or AE;Decreased safety awareness OT Treatment Interventions: Self-care/ADL training;Energy conservation;DME and/or AE instruction;Therapeutic exercise;Therapeutic activities;Patient/family education;Balance training   OT Goals Acute Rehab OT Goals OT Goal Formulation: With patient Time For Goal Achievement: 12/21/12 Potential to Achieve Goals: Good ADL Goals Pt Will Perform Lower Body Bathing: with modified independence;Sit to stand from chair ADL Goal: Lower Body Bathing - Progress: Goal set today Pt Will Perform Lower Body Dressing: with modified independence;Sit to stand from bed;Sit to stand from chair ADL Goal: Lower Body Dressing - Progress: Goal set today Pt Will Transfer to Toilet: with modified independence;Ambulation ADL Goal: Toilet Transfer - Progress: Goal set today Pt Will Perform Toileting - Clothing Manipulation: with modified independence;Standing ADL Goal: Toileting - Clothing Manipulation - Progress: Goal set today Pt Will Perform Toileting - Hygiene: with modified independence;Sit to stand from 3-in-1/toilet;Sitting on 3-in-1 or toilet ADL Goal: Toileting - Hygiene - Progress: Goal set today Pt Will Perform Tub/Shower Transfer: Tub transfer;with supervision;Ambulation;with DME ADL Goal: Tub/Shower Transfer - Progress: Goal set today  Visit Information  Last OT Received On: 12/14/12 Assistance Needed: +1    Subjective Data       Prior Functioning     Home Living Lives With: Alone Available Help at Discharge: Friend(s);Available PRN/intermittently (neighbors/sister lives down the hall) Type of Home: Apartment Home Access: Level entry Home Layout: One level Bathroom Shower/Tub: Tub/shower  unit Bathroom Toilet: Standard Home Adaptive Equipment: Grab bars around toilet;Grab bars in shower Prior Function Level of Independence: Independent Driving: No Communication Communication: No difficulties         Vision/Perception Vision - History Baseline Vision: Other (comment) Visual History:  (drooping lower lid of left eye) Patient Visual Report: No change from baseline;Other (comment) (however did have difficulty seeing during session)   Cognition  Cognition Arousal/Alertness: Awake/alert Behavior During Therapy: WFL for tasks assessed/performed Overall Cognitive Status: Impaired/Different from baseline (unaware of pt's baseline-no family present) Area of Impairment: Safety/judgement;Attention Current Attention Level: Self-distracting Safety/Judgement: Decreased awareness of safety;Decreased awareness of deficits    Extremity/Trunk Assessment Right Upper Extremity Assessment RUE ROM/Strength/Tone: WFL for tasks assessed (generalized weakness) Left Upper Extremity Assessment LUE ROM/Strength/Tone: WFL for tasks assessed (generalized weakness)     Mobility Bed Mobility Bed Mobility: Supine to Sit;Sitting - Scoot to Edge of Bed Supine to Sit: 5: Supervision;HOB flat Sitting - Scoot to Edge of Bed: 5: Supervision Details for Bed Mobility Assistance: Supervision for safety. Transfers Transfers: Sit to Stand;Stand to Sit Sit to Stand: 4: Min guard;With upper extremity assist;From bed;From toilet Stand to Sit: 4: Min guard;With upper extremity assist;To toilet;To chair/3-in-1 Details for Transfer Assistance: Cues for hand placement and technique     Exercise     Balance     End of Session OT -  End of Session Equipment Utilized During Treatment: Gait belt Activity Tolerance: Patient tolerated treatment well Patient left: in chair;with call bell/phone within reach  GO     Earlie Raveling OTR/L [phone] 12/14/2012, 3:25 PM

## 2012-12-15 MED ORDER — WHITE PETROLATUM GEL
Status: AC
Start: 2012-12-15 — End: 2012-12-15
  Administered 2012-12-15: 08:00:00
  Filled 2012-12-15: qty 5

## 2012-12-15 MED ORDER — ARTIFICIAL TEARS OP OINT
1.0000 "application " | TOPICAL_OINTMENT | Freq: Every evening | OPHTHALMIC | Status: DC | PRN
  Administered 2012-12-16 (×2): 1 via OPHTHALMIC
  Filled 2012-12-15: qty 3.5

## 2012-12-15 MED ORDER — TERBINAFINE HCL 1 % EX CREA
1.0000 "application " | TOPICAL_CREAM | Freq: Every day | CUTANEOUS | Status: DC
  Filled 2012-12-15: qty 12

## 2012-12-15 MED ORDER — SODIUM CHLORIDE 0.9 % IV SOLN: INTRAVENOUS | Status: AC

## 2012-12-15 MED ORDER — CEFAZOLIN SODIUM 1-5 GM-% IV SOLN
1.0000 g | INTRAVENOUS | Status: AC
  Filled 2012-12-15 (×2): qty 50

## 2012-12-15 MED ORDER — TERBINAFINE HCL 1 % EX CREA
TOPICAL_CREAM | Freq: Every day | CUTANEOUS | Status: DC
  Filled 2012-12-15 (×2): qty 15

## 2012-12-15 NOTE — Progress Notes (Addendum)
Physical Therapy Treatment Patient Details Name: Jane [name] MRN: [id number] DOB: 1928/07/14 Today's Date: 12/15/2012 Time: 8295-6213 PT Time Calculation (min): 19 min  PT Assessment / Plan / Recommendation Comments on Treatment Session   Pt cont's to present with decreased safety awareness, decreased knowledge of DME, & decreased balance.      Follow Up Recommendations  Home health PT     Does the patient have the potential to tolerate intense rehabilitation     Barriers to Discharge        Equipment Recommendations  Rolling walker with 5" wheels    Recommendations for Other Services    Frequency Min 3X/week   Plan Discharge plan remains appropriate;Frequency remains appropriate    Precautions / Restrictions Precautions Precautions: Fall Restrictions Weight Bearing Restrictions: No   Pertinent Vitals/Pain C/o HA across forehead    Mobility  Bed Mobility Bed Mobility: Not assessed Transfers Transfers: Sit to Stand;Stand to Sit Sit to Stand: 5: Supervision;With upper extremity assist;With armrests;From chair/3-in-1 Stand to Sit: 5: Supervision;With upper extremity assist;With armrests;To chair/3-in-1 Details for Transfer Assistance: cues to reinforce safe hand placement Ambulation/Gait Ambulation/Gait Assistance: 4: Min guard Ambulation Distance (Feet): 180 Feet Assistive device: Rolling walker Ambulation/Gait Assistance Details: Cues for body positioning inside RW, look ahead, & posture.   Gait Pattern: Step-through pattern;Decreased stride length;Shuffle;Trunk flexed Gait velocity: decreased Stairs: No Wheelchair Mobility Wheelchair Mobility: No      PT Goals Acute Rehab PT Goals Time For Goal Achievement: 12/28/12 Potential to Achieve Goals: Good Pt will Stand: with modified independence;with unilateral upper extremity support;3 - 5 min PT Goal: Stand - Progress: Progressing toward goal Pt will Ambulate: >150 feet;with modified independence;with least  restrictive assistive device PT Goal: Ambulate - Progress: Progressing toward goal Pt will Perform Home Exercise Program: Independently  Visit Information  Last PT Received On: 12/15/12 Assistance Needed: +1    Subjective Data      Cognition  Cognition Arousal/Alertness: Awake/alert Behavior During Therapy: WFL for tasks assessed/performed Overall Cognitive Status: Impaired/Different from baseline Area of Impairment: Safety/judgement;Memory Memory: Decreased short-term memory Safety/Judgement: Decreased awareness of safety    Balance     End of Session PT - End of Session Equipment Utilized During Treatment: Gait belt Activity Tolerance: Patient tolerated treatment well Patient left: in chair;with call bell/phone within reach Nurse Communication: Mobility status     Verdell Face, [place] [phone] 12/15/2012

## 2012-12-15 NOTE — Progress Notes (Signed)
Internal Medicine Teaching Service Attending Note Date: 12/15/2012  Patient name: Jane [name]  Medical record number: [id number]  Date of birth: Apr 20, 1929  Met with patient in the morning. She has mild midline headache. She is aware that a biopsy procedure needs to be done. I talked to her and explained about it to her. Apparently, she wants to discuss with family and decide.We want to get the biopsy done here because the patient is a likely no-show patient as per her past history with the clinic.   Her exam is unchanged from yesterday. She feels sore in her leg wounds today.     Recent Labs Lab 12/12/12 1228 12/13/12 0515  HGB 11.8* 10.0*  HCT 33.8* 29.7*  WBC 8.4 7.6  PLT 136* 123*    Recent Labs Lab 12/12/12 1228 12/13/12 0515  NA 138 139  K 3.8 3.6  CL 97 104  CO2 23 24  GLUCOSE 115* 97  BUN 34* 27*  CREATININE 1.33* 1.24*  CALCIUM 9.1 8.2*     I have reviewed Dr. Waynard Reeds note and I agree with her exam findings. She has not spiked any new fevers. Tachycardia resolved. Kidney function improving but not back to baseline. Will get labs from today. Continue hydration.  We would try to get in touch with her family at the earliest to get the procedure done. We will continue prednisone.    Antoine Vandermeulen 12/15/2012, 1:29 PM.

## 2012-12-15 NOTE — Progress Notes (Signed)
NEURO HOSPITALIST PROGRESS NOTE   SUBJECTIVE:                                                                                                                        Patient states she had no HA over night but now has a HA midline on forehead.  When asked again about which side HA started she cannot tell me and is unsure--again stating "it was on both sides my head".   OBJECTIVE:                                                                                                                           Vital signs in last 24 hours: Temp:  [97.6 F (36.4 C)-98.8 F (37.1 C)] 97.6 F (36.4 C) (06/17 0455) Pulse Rate:  [52-80] 72 (06/17 0455) Resp:  [16-20] 18 (06/17 0455) BP: (139-174)/(45-75) 139/52 mmHg (06/17 0455) SpO2:  [96 %-99 %] 99 % (06/17 0455)  Intake/Output from previous day:   Intake/Output this shift:   Nutritional status: General  Past Medical History  Diagnosis Date  . Anemia   . Anxiety   . Arthritis   . GERD (gastroesophageal reflux disease)   . Hyperlipidemia   . Hypertension   . Vitamin D deficiency   . Colon polyp     2009 colonoscopy  . Hiatal hernia   . Diverticulosis   . Aortic insufficiency     mild  . Stroke 1975     Neurologic Exam:  Mental Status:  Alert, oriented. Speech fluent without evidence of aphasia. Able to follow 3 step commands without difficulty.   Cranial Nerves:  II: Discs flat bilaterally; Visual fields grossly normal, pupils equal, round, reactive to light and accommodation  III,IV, VI: Ectropion Left lower lid, extra-ocular motions intact bilaterally  V,VII: smile symmetric, facial light touch sensation normal bilaterally  VIII: hearing normal bilaterally  IX,X: gag reflex present  XI: bilateral shoulder shrug  XII: midline tongue extension   Motor:  Moving all extremities antigravity Tone and bulk:normal tone throughout; no atrophy noted  Sensory: Pinprick and light touch intact  throughout, bilaterally but decreased bilateral LE from calf to foot due to sever PVD  Deep Tendon Reflexes: 2+ and symmetric throughout UE and  bilateral KJ, no AJ  Plantars:  Mute bilaterally  Cerebellar:  normal finger-to-nose,  CV: pulses palpable throughout   Lab Results: Lab Results  Component Value Date/Time   CHOL 202* 04/18/2008  9:24 PM   Lipid Panel No results found for this basename: CHOL, TRIG, HDL, CHOLHDL, VLDL, LDLCALC,  in the last 72 hours  Studies/Results: No results found.  MEDICATIONS                                                                                                                        Scheduled: . aspirin EC  81 mg Oral Daily  . [START ON 12/16/2012]  ceFAZolin (ANCEF) IV  1 g Intravenous On Call  . enoxaparin (LOVENOX) injection  40 mg Subcutaneous Q24H  . erythromycin  1 application Left Eye BID  . metoprolol succinate  200 mg Oral Daily  . pantoprazole  40 mg Oral Daily  . predniSONE  40 mg Oral Q breakfast  . white petrolatum        ASSESSMENT/PLAN:                                                                                                            79 YO female with 3 day history of HA, fluctuating lowe grade fever and SED rate of 83. HA has resolved after initiation of prednisone. Vascular surgery has agreed to do biopsy but is unsure which side to biopsy as patient cannot give a good history of which side her HA started. Patient initially complained of bilateral temporal HA thus either side would be ok to biopsy.   Recommend: 1) Continue Prednisone 2) Continue with Temporal Artery biopsy (either side)    Assessment and plan discussed with with attending physician and they are in agreement.    Felicie Morn PA-C [facility] [phone]  12/15/2012, 9:53 AM

## 2012-12-15 NOTE — Progress Notes (Addendum)
Subjective:    Jane [name] was seen and examined at bedside this morning.  She was seen by neurology and vascular yesterday who agree with temporal artery biopsy.  She reports mild improvement in her headache again today, but claiming it just started again this morning, going across her forehead.  She denies any new vision disturbance, N/V/D, abdominal pain, chest pain, or shortness of breat at this time.  Afebrile overnight.      Objective:    Vital Signs:   Temp:  [97.6 F (36.4 C)-98.8 F (37.1 C)] 97.6 F (36.4 C) (06/17 0455) Pulse Rate:  [52-80] 72 (06/17 0455) Resp:  [16-20] 18 (06/17 0455) BP: (139-174)/(45-75) 139/52 mmHg (06/17 0455) SpO2:  [96 %-99 %] 99 % (06/17 0455) Last BM Date: 12/13/12   Physical Exam: General: Vital signs reviewed and noted. Well-developed, well-nourished, in no acute distress; alert, appropriate and cooperative throughout examination.  HEENT:  Everted L lower eyelid, erythema. No tenderness to palpation of temporal regions.  Lungs:  Normal respiratory effort. Clear to auscultation BL without crackles or wheezes.  Heart: RRR. S1 and S2 normal without gallop, murmur, or rubs.  Abdomen:  BS normoactive. Soft, Nondistended, non-tender.  No masses or organomegaly.  Extremities: No pretibial edema. Skin changes consistent with chronic venous disease. Multiple scars and excoriations    Labs:  Basic Metabolic Panel:  Recent Labs Lab 12/12/12 1228 12/13/12 0515  NA 138 139  K 3.8 3.6  CL 97 104  CO2 23 24  GLUCOSE 115* 97  BUN 34* 27*  CREATININE 1.33* 1.24*  CALCIUM 9.1 8.2*    Liver Function Tests:  Recent Labs Lab 12/12/12 1228 12/13/12 0515  AST 20 23  ALT 12 12  ALKPHOS 65 51  BILITOT 0.9 0.6  PROT 8.3 6.8  ALBUMIN 3.4* 2.7*    Recent Labs Lab 12/12/12 1228  LIPASE 19    CBC:  Recent Labs Lab 12/12/12 1228 12/13/12 0515  WBC 8.4 7.6  NEUTROABS 6.7  --   HGB 11.8* 10.0*  HCT 33.8* 29.7*  MCV 91.6 91.7  PLT  136* 123*   Microbiology: Results for orders placed during the hospital encounter of 12/12/12  CULTURE, BLOOD (ROUTINE X 2)     Status: None   Collection Time    12/12/12 11:40 AM      Result Value Range Status   Specimen Description BLOOD RIGHT ARM   Final   Special Requests BOTTLES DRAWN AEROBIC AND ANAEROBIC 10CC   Final   Culture  Setup Time 12/12/2012 18:52   Final   Culture     Final   Value:        BLOOD CULTURE RECEIVED NO GROWTH TO DATE CULTURE WILL BE HELD FOR 5 DAYS BEFORE ISSUING A FINAL NEGATIVE REPORT   Report Status PENDING   Incomplete  CULTURE, BLOOD (ROUTINE X 2)     Status: None   Collection Time    12/12/12 11:42 AM      Result Value Range Status   Specimen Description BLOOD LEFT ARM   Final   Special Requests BOTTLES DRAWN AEROBIC ONLY 10CC   Final   Culture  Setup Time 12/12/2012 18:52   Final   Culture     Final   Value:        BLOOD CULTURE RECEIVED NO GROWTH TO DATE CULTURE WILL BE HELD FOR 5 DAYS BEFORE ISSUING A FINAL NEGATIVE REPORT   Report Status PENDING   Incomplete  URINE CULTURE  Status: None   Collection Time    12/12/12  3:18 PM      Result Value Range Status   Specimen Description URINE, CATHETERIZED   Final   Special Requests NONE   Final   Culture  Setup Time 12/12/2012 21:33   Final   Colony Count NO GROWTH   Final   Culture NO GROWTH   Final   Report Status 12/13/2012 FINAL   Final   Coagulation Studies:  Recent Labs  12/12/12 1228  LABPROT 14.7  INR 1.17     Medications:    Infusions:    Scheduled Medications: . aspirin EC  81 mg Oral Daily  . enoxaparin (LOVENOX) injection  40 mg Subcutaneous Q24H  . erythromycin  1 application Left Eye BID  . metoprolol succinate  200 mg Oral Daily  . pantoprazole  40 mg Oral Daily  . predniSONE  40 mg Oral Q breakfast   PRN Medications: oxyCODONE  Assessment/ Plan:   Jane [name] is a 79 year old female with PMH of anemia, GERD, HTN, and Diverticulosis presenting with fever of  unknown origin and headache.  CXR negative for airspace consolidation. CT head negative for any acute intracranial findings with stable cerebral atrophy and chronic small vessel disease. ?temporal arteritis  Headache and Fever - Tmax = 102.2 during admission.  Afebrile overnight. Initially noted to have tachycardia (HR ~120s now resolved). No leukocytosis. No identifiable source of infection. 2v CXR unrevealing of pneumonia (results discussed with radiology given presence of atelectasis). Urine culture negative. Initially complained consistently of temporal headache with fever, elevated ESR 83 with absence of other symptoms or a source of infection, and denies vision change; concern for temporal arteritis and started on prednisone 12/13/12. - appreciate neurology following, agree with temporal artery biopsy - appreciate vascular following, proceed with biopsy on 12/16/12, no site preference as her original complaints were bitemporal; attempted to contact family, son Casimiro Needle, no answer, left voicemail to call back - prednisone 40mg  QD (x2-4 weeks followed by taper) - blood cx x2 NGTD  Dehydration--resolved.  initially with decreased PO intake and tachycardia, improved with fluids (2L bolus in ED and NS @125cc /hr). Pt insists she cares for herself and cooks for herself at home, however her family members are concerned about her ability to do so on her own. Will evaluate further on this issue to ensure pt is able to safely care for herself and cook regularly enough to provide herself with adequate nutrition to help avoid similar future episodes. Not noted to be orthostatic.  - PT/OT eval--home health PT and OT -Cr improving, down to 1.24 on 6/15  Ectropion L lower eyelid--everted lower eyelid. Pt unable to give an accurate history as to when this issue began, however it appears it has been present and unchanged for at least 5-6 months. Reports fall at least 1 year ago resulting in "bump" on left eye but says  eyelid change happened after the injury.  She has been prescribed erythromycin ophthalmic ointment for possible infection associated with the issue, however there does not appear to be any clear signs of infection at time of admission.  - d/c erythro ophthalmic ointment -discussed with pharmacy for hydrating ointment   Hx stroke - remote (1975). -continue ASA at this time.    DVT PPX - lovenox  CODE STATUS - full  CONSULTS PLACED -  DISPO - Disposition is deferred at this time, awaiting improvement of current medical problems.   Anticipated discharge in approximately 1-2  day(s).   The patient does have a current PCP (Bosie Clos, KAREN, MD) and does need an [facility] hospital follow-up appointment after discharge.    Does the patient have transportation limitations that hinder transportation to clinic appointments? unknown   SERVICE NEEDED AT DISCHARGE - TO BE DETERMINED DURING HOSPITAL COURSE         Y = Yes, Blank = No PT: Home health PT  OT: Home health OT  RN:   Equipment: Rolling walker  Other:     Length of Stay: 3 day(s)   Signed: Darden Palmer, MD  PGY-1, Internal Medicine Resident Pager: [phone] (7AM-5PM) 12/15/2012, 7:25 AM

## 2012-12-15 NOTE — Consult Note (Signed)
WOC consult Note Reason for Consult: Consult requested for bilat legs and feet.  Pt has long, dry, thick crumbling toenails to bilat feet. No open wounds or drainage noted, strong foul odor.  Left and right calves have scattered areas of dry scabs which remove easily, revealing patchy areas of partial thickness wounds.  All sites are pink and moist and very painful to touch.  Left leg affected area is 3X3X.1cm and right leg is 2X1X.1cm Dressing procedure/placement/frequency: Foam dressing to protect legs and promote healing. Pt could benefit from topical treatment for fungal condition to bilat toes.  Please order if desired and refer pt to podiatrist after discharge for further care. Please re-consult if further assistance is needed.  Thank-you,  Cammie Mcgee MSN, RN, CWOCN, [place], CNS [phone]

## 2012-12-15 NOTE — Progress Notes (Signed)
Pharmacy Note 15-December-2012, 1030h Pharmacy students rounding with the IMTS-B1 Service, asked to consult for appropriate selection of ophthalmic medication in this 79 yo female with ectropion left lower eyelid. Patient was using erythromycin ointment, but this was discontinued as there is no evidence of infection at this time.  The clinical management guidelines for ectropion provided by the [employer] recommend an unmedicated ocular ointment for use at bedtime. Based on hospital formulary considerations, Artificial Tears ointment is recommended for use in this patient. A thin layer of ointment should be applied to the exposed eyelid, taking care not to touch tip of applicator to eyelid. This ointment can be used as often as needed to reduce irritation of the lower eyelid. The patient may notice blurred vision if ointment gets into her eye; if this is problematic, administering the ointment only at bedtime would be the best option. Preservative free eye drops could be added if patient begins experiencing ocular dryness. If used, drops should be administered 10 minutes prior to ointment application.  Thank you for the opportunity to participate in this patient's clinical care.   Wallie Char, [employer] PharmD Candidate J. Eli Phillips, [employer] PharmD Candidate

## 2012-12-16 ENCOUNTER — Encounter (HOSPITAL_COMMUNITY): Payer: Self-pay | Admitting: Anesthesiology

## 2012-12-16 ENCOUNTER — Inpatient Hospital Stay (HOSPITAL_COMMUNITY): Payer: Medicare Other | Admitting: Anesthesiology

## 2012-12-16 ENCOUNTER — Encounter (HOSPITAL_COMMUNITY)
Admission: EM | Disposition: A | Discharge status: Home / Self Care | Payer: Self-pay | Source: Home / Self Care | Attending: Internal Medicine

## 2012-12-16 DIAGNOSIS — R51 Headache: Secondary | ICD-10-CM | POA: Diagnosis present

## 2012-12-16 DIAGNOSIS — R519 Headache, unspecified: Secondary | ICD-10-CM | POA: Diagnosis present

## 2012-12-16 HISTORY — PX: ARTERY BIOPSY: SHX891

## 2012-12-16 LAB — BASIC METABOLIC PANEL
BUN: 37 mg/dL — ABNORMAL HIGH (ref 6–23)
Chloride: 104 mEq/L (ref 96–112)
Creatinine, Ser: 1.25 mg/dL — ABNORMAL HIGH (ref 0.50–1.10)
GFR calc Af Amer: 45 mL/min — ABNORMAL LOW (ref >90)
GFR calc non Af Amer: 39 mL/min — ABNORMAL LOW (ref >90)
Potassium: 3.5 mEq/L (ref 3.5–5.1)

## 2012-12-16 LAB — CBC
HCT: 28.8 % — ABNORMAL LOW (ref 36.0–46.0)
MCHC: 33.7 g/dL (ref 30.0–36.0)
RDW: 12.8 % (ref 11.5–15.5)
WBC: 9.1 10*3/uL (ref 4.0–10.5)

## 2012-12-16 LAB — PROTIME-INR
INR: 1.03 (ref 0.00–1.49)
Prothrombin Time: 13.4 seconds (ref 11.6–15.2)

## 2012-12-16 LAB — SURGICAL PCR SCREEN
MRSA, PCR: NEGATIVE
Staphylococcus aureus: NEGATIVE

## 2012-12-16 SURGERY — BIOPSY TEMPORAL ARTERY
Anesthesia: Monitor Anesthesia Care | Site: Head | Laterality: Left | Wound class: Clean

## 2012-12-16 MED ORDER — DROPERIDOL 2.5 MG/ML IJ SOLN
0.6250 mg | INTRAMUSCULAR | Status: DC | PRN
  Filled 2012-12-16: qty 0.25

## 2012-12-16 MED ORDER — FLUCONAZOLE 50 MG PO TABS
50.0000 mg | ORAL_TABLET | Freq: Every day | ORAL | Status: DC
  Administered 2012-12-16 – 2012-12-17 (×2): 50 mg via ORAL
  Filled 2012-12-16 (×2): qty 1

## 2012-12-16 MED ORDER — FENTANYL CITRATE 0.05 MG/ML IJ SOLN
25.0000 ug | INTRAMUSCULAR | Status: DC | PRN
  Administered 2012-12-16 (×2): 25 ug via INTRAVENOUS

## 2012-12-16 MED ORDER — PROPOFOL INFUSION 10 MG/ML OPTIME
INTRAVENOUS | Status: DC | PRN
  Administered 2012-12-16 (×2): 3 mL via INTRAVENOUS

## 2012-12-16 MED ORDER — SODIUM CHLORIDE 0.9 % IV SOLN
INTRAVENOUS | Status: DC | PRN
  Administered 2012-12-16: 12:00:00 via INTRAVENOUS

## 2012-12-16 MED ORDER — LIDOCAINE HCL (CARDIAC) 20 MG/ML IV SOLN
INTRAVENOUS | Status: DC | PRN
  Administered 2012-12-16: 40 mg via INTRAVENOUS

## 2012-12-16 MED ORDER — ONDANSETRON HCL 4 MG/2ML IJ SOLN
INTRAMUSCULAR | Status: DC | PRN
  Administered 2012-12-16: 4 mg via INTRAVENOUS

## 2012-12-16 MED ORDER — ENOXAPARIN SODIUM 40 MG/0.4ML ~~LOC~~ SOLN
40.0000 mg | SUBCUTANEOUS | Status: DC
  Administered 2012-12-17: 40 mg via SUBCUTANEOUS
  Filled 2012-12-16: qty 0.4

## 2012-12-16 MED ORDER — 0.9 % SODIUM CHLORIDE (POUR BTL) OPTIME
TOPICAL | Status: DC | PRN
  Administered 2012-12-16: 1000 mL

## 2012-12-16 MED ORDER — LIDOCAINE HCL (PF) 1 % IJ SOLN
INTRAMUSCULAR | Status: DC | PRN
  Administered 2012-12-16: 3 mL via INTRADERMAL

## 2012-12-16 MED ORDER — ACETAMINOPHEN 10 MG/ML IV SOLN: 1000.0000 mg | Freq: Once | INTRAVENOUS | Status: DC | PRN

## 2012-12-16 MED ORDER — CEPHALEXIN 500 MG PO CAPS
500.0000 mg | ORAL_CAPSULE | Freq: Two times a day (BID) | ORAL | Status: DC
  Administered 2012-12-16 – 2012-12-17 (×3): 500 mg via ORAL
  Filled 2012-12-16 (×4): qty 1

## 2012-12-16 SURGICAL SUPPLY — 36 items
BLADE SURG ROTATE 9660 (MISCELLANEOUS) ×2 IMPLANT
CANISTER SUCTION 2500CC (MISCELLANEOUS) ×2 IMPLANT
CLOTH BEACON ORANGE TIMEOUT ST (SAFETY) ×2 IMPLANT
CONT SPEC 4OZ CLIKSEAL STRL BL (MISCELLANEOUS) ×2 IMPLANT
COTTONBALL LRG STERILE PKG (GAUZE/BANDAGES/DRESSINGS) ×2 IMPLANT
COVER SURGICAL LIGHT HANDLE (MISCELLANEOUS) ×2 IMPLANT
DECANTER SPIKE VIAL GLASS SM (MISCELLANEOUS) ×2 IMPLANT
DERMABOND ADVANCED (GAUZE/BANDAGES/DRESSINGS) ×1
DERMABOND ADVANCED .7 DNX12 (GAUZE/BANDAGES/DRESSINGS) ×1 IMPLANT
DRAPE LAPAROTOMY T 102X78X121 (DRAPES) ×2 IMPLANT
ELECT REM PT RETURN 9FT ADLT (ELECTROSURGICAL) ×2
ELECTRODE REM PT RTRN 9FT ADLT (ELECTROSURGICAL) ×1 IMPLANT
GEL ULTRASOUND 20GR AQUASONIC (MISCELLANEOUS) ×2 IMPLANT
GLOVE SS BIOGEL STRL SZ 7 (GLOVE) ×1 IMPLANT
GLOVE SUPERSENSE BIOGEL SZ 7 (GLOVE) ×1
GLOVE SURG SS PI 7.0 STRL IVOR (GLOVE) ×2 IMPLANT
GOWN STRL NON-REIN LRG LVL3 (GOWN DISPOSABLE) ×4 IMPLANT
GOWN STRL REIN XL XLG (GOWN DISPOSABLE) ×2 IMPLANT
KIT BASIN OR (CUSTOM PROCEDURE TRAY) ×2 IMPLANT
KIT ROOM TURNOVER OR (KITS) ×2 IMPLANT
NEEDLE HYPO 25GX1X1/2 BEV (NEEDLE) ×2 IMPLANT
NS IRRIG 1000ML POUR BTL (IV SOLUTION) ×2 IMPLANT
PACK GENERAL/GYN (CUSTOM PROCEDURE TRAY) ×2 IMPLANT
PAD ARMBOARD 7.5X6 YLW CONV (MISCELLANEOUS) ×4 IMPLANT
SPONGE LAP 4X18 X RAY DECT (DISPOSABLE) ×2 IMPLANT
SUCTION FRAZIER TIP 10 FR DISP (SUCTIONS) ×2 IMPLANT
SUT PROLENE 6 0 BV (SUTURE) IMPLANT
SUT SILK 2 0 (SUTURE) ×1
SUT SILK 2-0 18XBRD TIE 12 (SUTURE) ×1 IMPLANT
SUT SILK 3 0 (SUTURE) ×1
SUT SILK 3-0 18XBRD TIE 12 (SUTURE) ×1 IMPLANT
SUT VICRYL 4-0 PS2 18IN ABS (SUTURE) ×2 IMPLANT
SYR CONTROL 10ML LL (SYRINGE) ×2 IMPLANT
TOWEL OR 17X24 6PK STRL BLUE (TOWEL DISPOSABLE) ×2 IMPLANT
TOWEL OR 17X26 10 PK STRL BLUE (TOWEL DISPOSABLE) ×2 IMPLANT
WATER STERILE IRR 1000ML POUR (IV SOLUTION) ×2 IMPLANT

## 2012-12-16 NOTE — Progress Notes (Signed)
Fall occurred t6/17/1014  2047.  Patient stated she walked to the door to look out the hallway.She attempted to turn around and walk back to the bed, but  she slide by the door. Pt knocked on the door for assistance. RN and other staff came in to assist patient.  RN assessed pt and found no redness, bruising, swelling or bleeding. Patient denied any pain at this time.  Patient was able to move all extremities.  Patient was assist back to bed.  Patient does not appear  to be in distress.  Dr. Zada Girt was informed of fall event. Patient's son was also informed of fall.  Patient was instructed not to get out of bed without assistance.  Patient was re-educated on using the call bed to call for nurse before getting up.  Bed alarm on.  Charise Carwin, RN.

## 2012-12-16 NOTE — Progress Notes (Signed)
Internal Medicine Teaching Service Internal Medicine Progress Note  Called by RN regarding patient fall earlier this evening.  Subjective: Patient without complaints. She recalls fall, and reports "everyone makes mistakes." She had gotten up to go to the bathroom (using her walker), after using the bathroom, she decided to walk to the door to look out without her walker, and when she turned around, she fell.  She denies dizziness, headache.  She did not hit her head, but hit her right arm against the door.    Objective: Filed Vitals:   12/16/12 0147  BP: 144/77  Pulse: 70  Temp: 97.7 F (36.5 C)  Resp: 18   General: resting in bed, no acute distress, follows commands HEENT: PERRL, EOMI, no scleral icterus, left lower eye lid ectropian  Cardiac: RRR, no rubs, murmurs or gallops Pulm: clear to auscultation bilaterally, moving normal volumes of air Abd: soft, nontender, nondistended Ext: warm and well perfused, no pedal edema Neuro: alert and oriented X3, cranial nerves II-XII grossly intact  Assessment/Plan Ms. Tanishka GORDANA KEWLEY is a 79 yo F admitted for HA on 12/12/12 being treated for temporal arteritis awaiting biopsy procedure.    #Mechanical Fall: No focal deficits.  -Continue PT -Encourage to always use walker -Bed alarm, up with assistance

## 2012-12-16 NOTE — H&P (View-Only) (Signed)
[facility] CONSULT NOTE 12/14/2012 DOB: [id number] MRN : [id number]  CC:HA  Referring Physician:[name] Marca Ancona, MD   History of Present Illness: [name] presents with headache.  She was admitted with a headache that had last for 3 days.  She noted bitemporal location on presentation in the ED.  To me, she noted frontal distribution.  She does not note any visual changes.  While in the hospital, she presented with increased temperatures and elevated ESR.  She was started on prednisone with improvement in sx.  Per Neurology, they are concerned with possible temporal arteritis.  Past Medical History  Diagnosis Date  . Anemia   . Anxiety   . Arthritis   . GERD (gastroesophageal reflux disease)   . Hyperlipidemia   . Hypertension   . Vitamin D deficiency   . Colon polyp     2009 colonoscopy  . Hiatal hernia   . Diverticulosis   . Aortic insufficiency     mild  . Stroke 1975    Past Surgical History  Procedure Laterality Date  . Appendectomy    . Abdominal hysterectomy      Social History History  Substance Use Topics  . Smoking status: Former Smoker    Types: Cigarettes    Quit date: 07/01/1984  . Smokeless tobacco: Not on file  . Alcohol Use: No    Family History Family History  Problem Relation Age of Onset  . Stroke Mother   . Hypertension Mother   . Stroke Father   . Hypertension Father   . Diabetes Sister     No Known Allergies  Current Facility-Administered Medications  Medication Dose Route Frequency Provider Last Rate Last Dose  . aspirin EC tablet 81 mg  81 mg Oral Daily Judie Bonus, MD   81 mg at 12/14/12 1010  . enoxaparin (LOVENOX) injection 40 mg  40 mg Subcutaneous Q24H Judie Bonus, MD   40 mg at 12/13/12 2222  . erythromycin ophthalmic ointment 1 application  1 application Left Eye BID Judie Bonus, MD   1 application at 12/14/12 1019  . metoprolol succinate (TOPROL-XL) 24 hr tablet 200  mg  200 mg Oral Daily Burns Spain, MD   200 mg at 12/14/12 1010  . oxyCODONE (Oxy IR/ROXICODONE) immediate release tablet 5 mg  5 mg Oral Q4H PRN Judie Bonus, MD   5 mg at 12/13/12 1720  . pantoprazole (PROTONIX) EC tablet 40 mg  40 mg Oral Daily Judie Bonus, MD   40 mg at 12/14/12 1019  . predniSONE (DELTASONE) tablet 40 mg  40 mg Oral Q breakfast Elfredia Nevins, MD   40 mg at 12/14/12 0837    ROS: [x]  Positive  [ ]  Denies    General: [ ]  Weight loss, [ ]  Fever, [ ]  chills Neurologic: [ ]  Dizziness, [ ]  Blackouts, [ ]  Seizure [ ]  Stroke, [ ]  "Mini stroke", [ ]  Slurred speech, [ ]  Temporary blindness; [ ]  weakness in arms or legs, [ ]  Hoarseness Cardiac: [ ]  Chest pain/pressure, [ ]  Shortness of breath at rest [ ]  Shortness of breath with exertion, [ ]  Atrial fibrillation or irregular heartbeat Vascular: [ ]  Pain in legs with walking, [ ]  Pain in legs at rest, [ ]  Pain in legs at night,  [ ]  Non-healing ulcer, [ ]  Blood clot in vein/DVT,   Pulmonary: [ ]  Home oxygen, [ ]  Productive cough, [ ]  Coughing up  blood, [ ]  Asthma,  [ ]  Wheezing Musculoskeletal:  [x ] Arthritis, [ ]  Low back pain, [x ] Joint pain Hematologic: [ ]  Easy Bruising, [ ]  Anemia; [ ]  Hepatitis Gastrointestinal: [ ]  Blood in stool, [ ]  Gastroesophageal Reflux/heartburn, [ ]  Trouble swallowing Urinary: [ ]  chronic Kidney disease, [ ]  on HD - [ ]  MWF or [ ]  TTHS, [ ]  Burning with urination, [ ]  Difficulty urinating Skin: [x ] Rashes, [ ]  Wounds Psychological: [x ] Anxiety, [ ]  Depression  Physical Examination Filed Vitals:   12/13/12 2249 12/14/12 0532 12/14/12 1140 12/14/12 1424  BP: 147/58 146/67 139/50 150/45  Pulse: 85 81 68 52  Temp: 100.7 F (38.2 C) 99.8 F (37.7 C) 98.8 F (37.1 C) 98 F (36.7 C)  TempSrc: Oral Oral Oral Oral  Resp: 18 18 16 20   Height:      Weight:      SpO2: 99% 95% 97% 98%   General:  WDWN in NAD, somewhat confused at times HENT: WNL Eyes: Pupils equal Ectropion  L lower eyelid she has seen an opthalmologist in the recent past. Pulmonary: normal non-labored breathing , without Rales, rhonchi,  wheezing Cardiac: RRR, without  Murmurs, rubs or gallops;No carotid bruits Abdomen: soft, NT, no masses Skin: Positive  Rashes left Evette Cristal /LE , toe nail changes consistent with onychomycosis Vascular Exam/Pulses:Radila, brachial,femoral and DP pulses palpable. Extremities without ischemic changes, no Gangrene , no cellulitis; no open wounds; Left LE skin irritation with skin sluffing. Musculoskeletal: no muscle wasting or atrophy  Neurologic: A&O X 3; Appropriate Affect ;  SENSATION: normal; MOTOR FUNCTION: Pt has good and equal strength in all extremities - 5/5; Speech is fluent/normal Psychiatry: judgment appears mostly intact even despite confusion, appropriate mood and affect  Lymph: no cervical or inguinal LAD  Laboratory: CBC:    Component Value Date/Time   WBC 7.6 12/13/2012 0515   RBC 3.24* 12/13/2012 0515   HGB 10.0* 12/13/2012 0515   HCT 29.7* 12/13/2012 0515   PLT 123* 12/13/2012 0515   MCV 91.7 12/13/2012 0515   MCH 30.9 12/13/2012 0515   MCHC 33.7 12/13/2012 0515   RDW 12.9 12/13/2012 0515   LYMPHSABS 0.7 12/12/2012 1228   MONOABS 0.9 12/12/2012 1228   EOSABS 0.1 12/12/2012 1228   BASOSABS 0.0 12/12/2012 1228    BMP:    Component Value Date/Time   NA 139 12/13/2012 0515   K 3.6 12/13/2012 0515   CL 104 12/13/2012 0515   CO2 24 12/13/2012 0515   GLUCOSE 97 12/13/2012 0515   BUN 27* 12/13/2012 0515   CREATININE 1.24* 12/13/2012 0515   CREATININE 1.10 09/18/2012 1709   CALCIUM 8.2* 12/13/2012 0515   GFRNONAA 39* 12/13/2012 0515   GFRAA 46* 12/13/2012 0515    Coagulation: Lab Results  Component Value Date   INR 1.17 12/12/2012   INR 1.10 12/02/2010   INR 1.04 12/02/2010   No results found for this basename: PTT   ESR 83  Radiology: No results found.   ASSESSMENT/PLAN: HA frontal/bitemporal area with elevated SED rate.  HA has improved since  her hospital stay and she is on Prednisone PO QD 40 mg  I will discuss possible Temporal artery biopsy with Dr. Amanda Cockayne, EMMA [facility] 12/14/2012 2:11 PM  Addendum  I have independently interviewed and examined the patient, and I agree with the physician assistant's findings.  Pt has history not consistent with classic temporal arteritis.  She does have a prominent temporal pulse bilaterally  and elevated ESR with improvement in atypical symptoms after starting prednisone.  Anedocately, I have never had a temporal artery biopsy come back positive, but I agree that this point probably merit it.  At this point, it is not clear which temporal artery should be biopsied.  Let us known which artery you want biopsied and we will arrange for it Wednesday (12/16/12).  Leonides Sake, MD [facility] Office: [phone] Pager: [phone]  12/14/2012, 9:03 PM

## 2012-12-16 NOTE — Anesthesia Postprocedure Evaluation (Signed)
  Anesthesia Post-op Note  Patient: Jane [name]  Procedure(s) Performed: Procedure(s): BIOPSY TEMPORAL ARTERY (Left)  Patient Location: PACU  Anesthesia Type:MAC  Level of Consciousness: awake, alert , oriented and patient cooperative  Airway and Oxygen Therapy: Patient Spontanous Breathing  Post-op Pain: none  Post-op Assessment: Post-op Vital signs reviewed, Patient's Cardiovascular Status Stable, Respiratory Function Stable, Patent Airway, No signs of Nausea or vomiting and Pain level controlled  Post-op Vital Signs: Reviewed and stable  Complications: No apparent anesthesia complications

## 2012-12-16 NOTE — Progress Notes (Signed)
Report given to Clarice Pole, RN. Pt transferred to holding area. Ancef tubed to holding area tube station.  Juliane Lack, RN

## 2012-12-16 NOTE — Progress Notes (Signed)
Pt is s/p left temporal artery bx 12/16/12.    Neurology/Internal Medicine to f/u on results of bx.  Follow up with [facility] as needed.  Doreatha Massed 12/16/2012 12:46 PM

## 2012-12-16 NOTE — Anesthesia Preprocedure Evaluation (Addendum)
Anesthesia Evaluation  Patient identified by MRN, date of birth, ID band Patient awake    Reviewed: Allergy & Precautions, H&P , NPO status , Patient's Chart, lab work & pertinent test results, reviewed documented beta blocker date and time   History of Anesthesia Complications Negative for: history of anesthetic complications  Airway Mallampati: II TM Distance: >3 FB Neck ROM: Limited    Dental  (+) Edentulous Upper and Edentulous Lower   Pulmonary former smoker,  breath sounds clear to auscultation  Pulmonary exam normal       Cardiovascular hypertension, Pt. on medications and Pt. on home beta blockers + Peripheral Vascular Disease + Valvular Problems/Murmurs AI Rhythm:Regular Rate:Normal  '09 ECHO: normal LVF, EF 55%, mild-mod AI without stenosis   Neuro/Psych CVA, No Residual Symptoms    GI/Hepatic Neg liver ROS, GERD-  Medicated and Controlled,  Endo/Other  Morbid obesity  Renal/GU Renal InsufficiencyRenal disease (creat 1.25)     Musculoskeletal   Abdominal (+) + obese,   Peds  Hematology  (+) Blood dyscrasia (Hb 9.7), anemia ,   Anesthesia Other Findings   Reproductive/Obstetrics                          Anesthesia Physical Anesthesia Plan  ASA: III  Anesthesia Plan: MAC   Post-op Pain Management:    Induction: Intravenous  Airway Management Planned: Natural Airway and Simple Face Mask  Additional Equipment:   Intra-op Plan:   Post-operative Plan:   Informed Consent: I have reviewed the patients History and Physical, chart, labs and discussed the procedure including the risks, benefits and alternatives for the proposed anesthesia with the patient or authorized representative who has indicated his/her understanding and acceptance.     Plan Discussed with: CRNA and Surgeon  Anesthesia Plan Comments: (Plan routine monitors, MAC)        Anesthesia Quick Evaluation

## 2012-12-16 NOTE — Transfer of Care (Signed)
Immediate Anesthesia Transfer of Care Note  Patient: Jane [name]  Procedure(s) Performed: Procedure(s): BIOPSY TEMPORAL ARTERY (Left)  Patient Location: PACU  Anesthesia Type:MAC  Level of Consciousness: alert  and patient cooperative  Airway & Oxygen Therapy: Patient Spontanous Breathing  Post-op Assessment: Report given to PACU RN and Post -op Vital signs reviewed and stable  Post vital signs: Reviewed and stable  Complications: No apparent anesthesia complications

## 2012-12-16 NOTE — Progress Notes (Signed)
Pt transferred from PACU to room 6N09 via stretcher. Pt A&O x 4. Incision to L face CDI. Pt denied pain or concerns. VS WNL. Call bell placed in reach. Called daughter-in-law to inform family that procedure was completed. Will continue to monitor pt closely. Report received from [place], [place].  Juliane Lack, RN

## 2012-12-16 NOTE — Progress Notes (Addendum)
Subjective:    Ms. Rausch was seen and examined at bedside this morning.  She reports return of hear headache today, going across her forehead and more on the back of her head.  She says she was feeling fine until her family members came to see her yesterday and she says they were "ordering her around to get the tests".  She is schedule to get her biopsy today and does wish to proceed, she just didn't like her family talking to her the way they did last night.  Otherwise, she did have a fall last night after going to the bathroom without using her walker. She denies hitting her head but fell on her right arm.  She reports some soreness in her right arm today but has good range of motion and has more pain on her right toes this morning.  She has been counseled to use assistance when out of bed. She denies any new vision disturbance, N/V/D, abdominal pain, chest pain, or shortness of breat at this time.     Objective:    Vital Signs:   Temp:  [97.7 F (36.5 C)-98.7 F (37.1 C)] 98.6 F (37 C) (06/18 1313) Pulse Rate:  [52-72] 54 (06/18 1313) Resp:  [18-28] 18 (06/18 1313) BP: (137-166)/(51-77) 154/74 mmHg (06/18 1313) SpO2:  [96 %-100 %] 99 % (06/18 1313) Last BM Date: 12/14/12   Physical Exam: General: Vital signs reviewed and noted. Well-developed, well-nourished, in no acute distress; alert, appropriate and cooperative throughout examination.  HEENT:  Everted L lower eyelid, erythema. No tenderness to palpation of temporal regions.  Lungs:  Normal respiratory effort. Clear to auscultation BL without crackles or wheezes.  Heart: irregular. S1 and S2 normal without gallop, murmur, or rubs.  Abdomen:  BS normoactive. Soft, Nondistended, non-tender.  No masses or organomegaly.  Extremities: No pretibial edema. Skin changes consistent with chronic venous disease. Multiple scars and excoriations and scabs, tender to palpation; long, thick, dried curled toenails into skin, tender to  palpation on right foot more than left, malodorous.      Labs:  Basic Metabolic Panel:  Recent Labs Lab 12/12/12 1228 12/13/12 0515 12/16/12 0635  NA 138 139 138  K 3.8 3.6 3.5  CL 97 104 104  CO2 23 24 24   GLUCOSE 115* 97 93  BUN 34* 27* 37*  CREATININE 1.33* 1.24* 1.25*  CALCIUM 9.1 8.2* 8.5   Liver Function Tests:  Recent Labs Lab 12/12/12 1228 12/13/12 0515  AST 20 23  ALT 12 12  ALKPHOS 65 51  BILITOT 0.9 0.6  PROT 8.3 6.8  ALBUMIN 3.4* 2.7*    Recent Labs Lab 12/12/12 1228  LIPASE 19    CBC:  Recent Labs Lab 12/12/12 1228 12/13/12 0515 12/16/12 0635  WBC 8.4 7.6 9.1  NEUTROABS 6.7  --   --   HGB 11.8* 10.0* 9.7*  HCT 33.8* 29.7* 28.8*  MCV 91.6 91.7 90.6  PLT 136* 123* 177   Microbiology: Results for orders placed during the hospital encounter of 12/12/12  CULTURE, BLOOD (ROUTINE X 2)     Status: None   Collection Time    12/12/12 11:40 AM      Result Value Range Status   Specimen Description BLOOD RIGHT ARM   Final   Special Requests BOTTLES DRAWN AEROBIC AND ANAEROBIC 10CC   Final   Culture  Setup Time 12/12/2012 18:52   Final   Culture     Final   Value:  BLOOD CULTURE RECEIVED NO GROWTH TO DATE CULTURE WILL BE HELD FOR 5 DAYS BEFORE ISSUING A FINAL NEGATIVE REPORT   Report Status PENDING   Incomplete  CULTURE, BLOOD (ROUTINE X 2)     Status: None   Collection Time    12/12/12 11:42 AM      Result Value Range Status   Specimen Description BLOOD LEFT ARM   Final   Special Requests BOTTLES DRAWN AEROBIC ONLY 10CC   Final   Culture  Setup Time 12/12/2012 18:52   Final   Culture     Final   Value:        BLOOD CULTURE RECEIVED NO GROWTH TO DATE CULTURE WILL BE HELD FOR 5 DAYS BEFORE ISSUING A FINAL NEGATIVE REPORT   Report Status PENDING   Incomplete  URINE CULTURE     Status: None   Collection Time    12/12/12  3:18 PM      Result Value Range Status   Specimen Description URINE, CATHETERIZED   Final   Special Requests NONE    Final   Culture  Setup Time 12/12/2012 21:33   Final   Colony Count NO GROWTH   Final   Culture NO GROWTH   Final   Report Status 12/13/2012 FINAL   Final  SURGICAL PCR SCREEN     Status: None   Collection Time    12/16/12  9:42 AM      Result Value Range Status   MRSA, PCR NEGATIVE  NEGATIVE Final   Staphylococcus aureus NEGATIVE  NEGATIVE Final   Comment:            The Xpert SA Assay (FDA     approved for NASAL specimens     in patients over 36 years of age),     is one component of     a comprehensive surveillance     program.  Test performance has     been validated by [employer] for patients greater     than or equal to 55 year old.     It is not intended     to diagnose infection nor to     guide or monitor treatment.   Coagulation Studies:  Recent Labs  12/16/12 0635  LABPROT 13.4  INR 1.03     Medications:    Infusions:    Scheduled Medications: . artificial tears  1 application Both Eyes QHS,MR X 1  . aspirin EC  81 mg Oral Daily  .  ceFAZolin (ANCEF) IV  1 g Intravenous On Call  . cephALEXin  500 mg Oral Q12H  . [START ON 12/17/2012] enoxaparin (LOVENOX) injection  40 mg Subcutaneous Q24H  . fluconazole  50 mg Oral Daily  . metoprolol succinate  200 mg Oral Daily  . pantoprazole  40 mg Oral Daily  . predniSONE  40 mg Oral Q breakfast  . terbinafine   Topical Daily   PRN Medications: oxyCODONE  Assessment/ Plan:   Ms. Berrong is a 79 year old female with PMH of anemia, GERD, HTN, and Diverticulosis presenting with fever of unknown origin and headache.  CXR negative for airspace consolidation. CT head negative for any acute intracranial findings with stable cerebral atrophy and chronic small vessel disease. ?temporal arteritis  Headache and Fever - Tmax = 102.2 during admission.  Afebrile now. Initially noted to have tachycardia (HR ~120s now resolved). No leukocytosis. No identifiable source of infection. 2v CXR unrevealing of pneumonia (results  discussed with radiology given presence of atelectasis). Urine culture negative. Initially complained consistently of temporal headache with fever, elevated ESR 83 with absence of other symptoms or a source of infection, and denies vision change; concern for temporal arteritis and started on prednisone 12/13/12. - appreciate neurology following, agree with temporal artery biopsy - appreciate vascular following, proceed with biopsy today, L temporal artery, discussed with son, Casimiro Needle and daughter in Industrial/product designer as well - continue prednisone 40mg  QD (x2-4 weeks followed by taper) - blood cx x2 NGTD - CRP per neuro  Dehydration--resolved.  initially with decreased PO intake and tachycardia, improved with fluids (2L bolus in ED and NS @125cc /hr). Pt insists she cares for herself and cooks for herself at home, however her family members are concerned about her ability to do so on her own. Will evaluate further on this issue to ensure pt is able to safely care for herself and cook regularly enough to provide herself with adequate nutrition to help avoid similar future episodes. Not noted to be orthostatic.  - PT/OT eval--home health PT and OT - monitoring Cr, slightly increased to 1.25 today  Ectropion L lower eyelid--everted lower eyelid. Pt unable to give an accurate history as to when this issue began, however it appears it has been present and unchanged for at least 5-6 months. Reports fall at least 1 year ago resulting in "bump" on left eye but says eyelid change happened after the injury.  She has been prescribed erythromycin ophthalmic ointment for possible infection associated with the issue, however there does not appear to be any clear signs of infection at time of admission.  - artificial tear ointment to L eyelid  Chronic malformed toe nails and onychomycosis with ?cellulitis and chronic venous insufficiency: chronic poor hygiene of feet and no recent toe nail clipping, has not followed up with  podiatry.  Appears to have fungal infection of toes.  Assessed by wound care: foam dressing to legs to promote healing and topical antifungal ointment to toes as well.   -start Fluoconazole 50mg  po qd (will not use terbinafine at this time due to poor outpatient follow up hx) -start Cephalexin 500mg  po bid x5 days for superimposed infection -will need podiatry follow up as outpatient -follow up with vascular as outpatient  Anemia--tending down Hb during admission.  Today Hb 9.7 down from 11.8 on admission.  Baseline Hb appears ~11.  No obvious source of bleeding at this time.  Denies hematemesis or melena or hematochezia.  Platelets increased to 177 today from 136 on admission.  INR 1.03.   -monitor Hb -will need to follow up with PCP as outpatient  Hx stroke - remote (1975). -continue ASA at this time.   Decreased balance--noted to have decreased safety awareness and balance by PT.  PT and OT recommend home health PT and OT at least 3x/week and 24 hour supervision/assistance and rolling walker with 5" wheels.   -consulted case management to assist with home health needs -PT/OT   DVT PPX - lovenox  CODE STATUS - full  CONSULTS PLACED -  DISPO - likely d/c tomorrow pending home health needs  Anticipated discharge in approximately 1-2 day(s).   The patient does have a current PCP (Bosie Clos, KAREN, MD) and does need an [facility] hospital follow-up appointment after discharge.    Does the patient have transportation limitations that hinder transportation to clinic appointments? unknown   SERVICE NEEDED AT DISCHARGE - TO BE DETERMINED DURING HOSPITAL COURSE  Y = Yes, Blank = No PT: Home health PT  OT: Home health OT, 24 hours supervision/assistance  RN:   Equipment: Rolling walker  Other:     Length of Stay: 4 day(s)   Signed: Darden Palmer, MD  PGY-1, Internal Medicine Resident Pager: [phone] (7AM-5PM) 12/16/2012, 2:43 PM

## 2012-12-16 NOTE — Progress Notes (Signed)
Spoke with MD regarding Ancef which was sent with pt on call to OR. Per Pam in holding area, CRNA did not give the pt the Ancef. MD stated to not give Ancef now, and to just give Keflex as ordered. Will continue to monitor pt closely.  Juliane Lack, RN

## 2012-12-16 NOTE — Progress Notes (Signed)
Subjective: Patient reports headache has returned.  Was doing well until her family had conversation with the doctors and would not let her participate.  This was stressful for her and her headache has returned.  It is across the front and radiates to her neck.  She is scheduled for temporal artery biopsy today.    Objective: Current vital signs: BP 140/61  Pulse 54  Temp(Src) 98.7 F (37.1 C) (Oral)  Resp 18  Ht 5' (1.524 m)  Wt 81.647 kg (180 lb)  BMI 35.15 kg/m2  SpO2 96% Vital signs in last 24 hours: Temp:  [97.7 F (36.5 C)-98.7 F (37.1 C)] 98.7 F (37.1 C) (06/18 1230) Pulse Rate:  [52-72] 54 (06/18 1245) Resp:  [17-28] 18 (06/18 1245) BP: (125-166)/(51-77) 140/61 mmHg (06/18 1245) SpO2:  [95 %-100 %] 96 % (06/18 1245)  Intake/Output from previous day:   Intake/Output this shift: Total I/O In: 200 [I.V.:200] Out: -  Nutritional status: NPO  Neurologic Exam: Mental Status:  Alert, oriented. Speech fluent without evidence of aphasia. Able to follow 3 step commands without difficulty.  Cranial Nerves:  II: Discs flat bilaterally; Visual fields grossly normal, pupils equal, round, reactive to light and accommodation  III,IV, VI: Ectropion left lower lid, extra-ocular motions intact bilaterally  V,VII: smile symmetric, facial light touch sensation normal bilaterally  VIII: hearing normal bilaterally  IX,X: gag reflex present  XI: bilateral shoulder shrug  XII: midline tongue extension  Motor:  Moving all extremities antigravity  Sensory: Pinprick and light touch intact throughout, bilaterally but decreased bilateral LE from calf to foot due to sever PVD  Deep Tendon Reflexes: 2+ and symmetric throughout UE and bilateral KJ, no AJ  Plantars:  Mute bilaterally   Lab Results: Basic Metabolic Panel:  Recent Labs Lab 12/12/12 1228 12/13/12 0515 12/16/12 0635  NA 138 139 138  K 3.8 3.6 3.5  CL 97 104 104  CO2 23 24 24   GLUCOSE 115* 97 93  BUN 34* 27* 37*   CREATININE 1.33* 1.24* 1.25*  CALCIUM 9.1 8.2* 8.5    Liver Function Tests:  Recent Labs Lab 12/12/12 1228 12/13/12 0515  AST 20 23  ALT 12 12  ALKPHOS 65 51  BILITOT 0.9 0.6  PROT 8.3 6.8  ALBUMIN 3.4* 2.7*    Recent Labs Lab 12/12/12 1228  LIPASE 19   No results found for this basename: AMMONIA,  in the last 168 hours  CBC:  Recent Labs Lab 12/12/12 1228 12/13/12 0515 12/16/12 0635  WBC 8.4 7.6 9.1  NEUTROABS 6.7  --   --   HGB 11.8* 10.0* 9.7*  HCT 33.8* 29.7* 28.8*  MCV 91.6 91.7 90.6  PLT 136* 123* 177    Cardiac Enzymes: No results found for this basename: CKTOTAL, CKMB, CKMBINDEX, TROPONINI,  in the last 168 hours  Lipid Panel: No results found for this basename: CHOL, TRIG, HDL, CHOLHDL, VLDL, LDLCALC,  in the last 168 hours  CBG: No results found for this basename: GLUCAP,  in the last 168 hours  Microbiology: Results for orders placed during the hospital encounter of 12/12/12  CULTURE, BLOOD (ROUTINE X 2)     Status: None   Collection Time    12/12/12 11:40 AM      Result Value Range Status   Specimen Description BLOOD RIGHT ARM   Final   Special Requests BOTTLES DRAWN AEROBIC AND ANAEROBIC 10CC   Final   Culture  Setup Time 12/12/2012 18:52   Final   Culture  Final   Value:        BLOOD CULTURE RECEIVED NO GROWTH TO DATE CULTURE WILL BE HELD FOR 5 DAYS BEFORE ISSUING A FINAL NEGATIVE REPORT   Report Status PENDING   Incomplete  CULTURE, BLOOD (ROUTINE X 2)     Status: None   Collection Time    12/12/12 11:42 AM      Result Value Range Status   Specimen Description BLOOD LEFT ARM   Final   Special Requests BOTTLES DRAWN AEROBIC ONLY 10CC   Final   Culture  Setup Time 12/12/2012 18:52   Final   Culture     Final   Value:        BLOOD CULTURE RECEIVED NO GROWTH TO DATE CULTURE WILL BE HELD FOR 5 DAYS BEFORE ISSUING A FINAL NEGATIVE REPORT   Report Status PENDING   Incomplete  URINE CULTURE     Status: None   Collection Time     12/12/12  3:18 PM      Result Value Range Status   Specimen Description URINE, CATHETERIZED   Final   Special Requests NONE   Final   Culture  Setup Time 12/12/2012 21:33   Final   Colony Count NO GROWTH   Final   Culture NO GROWTH   Final   Report Status 12/13/2012 FINAL   Final  SURGICAL PCR SCREEN     Status: None   Collection Time    12/16/12  9:42 AM      Result Value Range Status   MRSA, PCR NEGATIVE  NEGATIVE Final   Staphylococcus aureus NEGATIVE  NEGATIVE Final   Comment:            The Xpert SA Assay (FDA     approved for NASAL specimens     in patients over 5 years of age),     is one component of     a comprehensive surveillance     program.  Test performance has     been validated by [employer] for patients greater     than or equal to 21 year old.     It is not intended     to diagnose infection nor to     guide or monitor treatment.    Coagulation Studies:  Recent Labs  12/16/12 0635  LABPROT 13.4  INR 1.03    Imaging: No results found.  Medications:  I have reviewed the patient's current medications. Scheduled: . artificial tears  1 application Both Eyes QHS,MR X 1  . aspirin EC  81 mg Oral Daily  .  ceFAZolin (ANCEF) IV  1 g Intravenous On Call  . [START ON 12/17/2012] enoxaparin (LOVENOX) injection  40 mg Subcutaneous Q24H  . metoprolol succinate  200 mg Oral Daily  . pantoprazole  40 mg Oral Daily  . predniSONE  40 mg Oral Q breakfast  . terbinafine   Topical Daily    Assessment/Plan: 79 year old with headaches and elevated sedimentation rate.  Temporal arteritis suspected.  Patient initially improved with steroids but headache has returned.    Recommendations: 1.  CRP 2.  Agree with biopsy 3.  Agree with continued steroids   LOS: 4 days   Thana Farr, MD [facility] [phone] 12/16/2012  1:10 PM

## 2012-12-16 NOTE — Op Note (Signed)
OPERATIVE REPORT  Date of Surgery: 12/12/2012 - 12/16/2012  Surgeon: [name], MD  Assistant: Nurse  Pre-op Diagnosis: headache Rule out temporal arteritis  Post-op Diagnosis: headache Rule out temporal arteritis  Procedure: Procedure(s): BIOPSY TEMPORAL ARTERY-left Anesthesia: MAC  EBL: 0  Patient was taken to the operating room placed in supine position at which time the left temporal area was prepped with Betadine scrub and solution draped in routine sterile manner. Just anterior to the left ear there was an excellent pulse in the superficial temporal artery. After infiltration with 1% Xylocaine a short 2 cm longitudinal incision was made carried into the subcutaneous tissue the superficial temporal artery was easily identified. It was normal in appearance. It was ligated proximally and distally with 2-0 silk ties an approximate 1-1/2 cm segment was removed and sent to the lab for evaluation. Adequate hemostasis was present and closed in layers of Vicryl subcuticular fashion with Dermabond taken to recovery in stable condition  Complications: None  Procedure Details:   [name], MD 12/16/2012 12:23 PM

## 2012-12-16 NOTE — Interval H&P Note (Signed)
History and Physical Interval Note:  12/16/2012 11:39 AM  Jane [name]  has presented today for surgery, with the diagnosis of headache Rule out temporal artheritis  The various methods of treatment have been discussed with the patient and family. After consideration of risks, benefits and other options for treatment, the patient has consented to  Procedure(s): BIOPSY TEMPORAL ARTERY (Left) as a surgical intervention .  The patient's history has been reviewed, patient examined, no change in status, stable for surgery.  I have reviewed the patient's chart and labs.  Questions were answered to the patient's satisfaction.     [name]

## 2012-12-16 NOTE — Care Management Note (Signed)
  Page 2 of 2   12/17/2012     9:47:18 AM   CARE MANAGEMENT NOTE 12/17/2012  Patient:  Jane [name], Jane [name]   Account Number:  [id number]  Date Initiated:  12/16/2012  Documentation initiated by:  Ronny Flurry  Subjective/Objective Assessment:     Action/Plan:   Anticipated DC Date:  12/17/2012   Anticipated DC Plan:  HOME W HOME HEALTH SERVICES         Choice offered to / List presented to:  C-1 Patient   DME arranged  Levan Hurst      DME agency  [facility]     [facility] arranged  HH-1 RN  HH-2 PT  HH-3 OT  HH-4 NURSE'S AIDE  HH-6 SOCIAL WORKER      HH agency  [facility]   Status of service:  Completed, signed off Medicare Important Message given?   (If response is "NO", the following Medicare IM given date fields will be blank) Date Medicare IM given:   Date Additional Medicare IM given:    Discharge Disposition:    Per UR Regulation:  Reviewed for med. necessity/level of care/duration of stay  If discussed at Long Length of Stay Meetings, dates discussed:    Comments:  12-16-12 Went back to talk to patient again . States she needs more time to think before deciding on a home health agency . If patient is discharged after hours . CM can call patient at home to set up home health .    Ronny Flurry RN BSN   12-16-12 Spoke with patient , patient's son and daughter in Social worker. Confirmed face sheet information.   Private duty Care Agencies list provided .  Patient stated she could not decide on home health agencies at present . " I need time to think".   Ronny Flurry RN BSN [phone]

## 2012-12-16 NOTE — Progress Notes (Signed)
Internal Medicine Teaching Service Attending Note Date: 12/16/2012  Patient name: Jane [name]  Medical record number: [id number]  Date of birth: July 04, 1928   Jane [name] has already received her biopsy today as I understand from RN note from 1:15 pm. I met with her in the morning with the team and she expressed willingness for the procedure.   In the morning, she appeared calm, did not complain of headache, but neck pain at the base of her head. She had a mechanical fall last night and her right shoulder hurt. She did not hit her head. She denies any chest pain, new back pain or confusion after the fall. She has been walking after the fall with the help of her walker.  Filed Vitals:   12/16/12 0935 12/16/12 1230 12/16/12 1245 12/16/12 1313  BP: 144/68 143/67 140/61 154/74  Pulse: 70 69 54 54  Temp: 98.7 F (37.1 C) 98.7 F (37.1 C)  98.6 F (37 C)  TempSrc: Oral   Oral  Resp: 18 28 18 18   Height:      Weight:      SpO2: 98% 98% 96% 99%   Exam General: No acute distress Heart: S1S2 sometimes irregular, no murmur Lung: Clear Feet: Extreme nail growth with tender digits today (on day 1 there was no pain) No swelling or erythema. No purulent discharge.    Recent Labs Lab 12/12/12 1228 12/13/12 0515 12/16/12 0635  BUN 34* 27* 37*  CREATININE 1.33* 1.24* 1.25*    Scheduled hospital medications: . artificial tears  1 application Both Eyes QHS,MR X 1  . aspirin EC  81 mg Oral Daily  .  ceFAZolin (ANCEF) IV  1 g Intravenous On Call  . [START ON 12/17/2012] enoxaparin (LOVENOX) injection  40 mg Subcutaneous Q24H  . metoprolol succinate  200 mg Oral Daily  . pantoprazole  40 mg Oral Daily  . predniSONE  40 mg Oral Q breakfast  . terbinafine   Topical Daily     Assessment and Plan   If the patient remains stable after the procedure, and her headache remains in remission, we can discharge her on prednisone with close follow up in the clinic. For her feet, we will start her  on Fluconazole for onychomycosis (not terbinafine as we fear she might not follow up regularly with [place] for her LFts), and cefalexin for her likely superimposed infection. Follow up BUN/Cr to be done in clinic in a week. She will need a podiatry referral.  Her hgb/hct have decreased slightly but steadily during this stay from baseline, however there does not seem to be an obvious source of bleeding. Her bilirubin is normal. She does not report any dark stools. Her platelets came back up from 123 to 177. We will advise for CBC within a week of discharge.    Fields Oros 12/16/2012, 2:02 PM.

## 2012-12-17 ENCOUNTER — Encounter (HOSPITAL_COMMUNITY): Payer: Self-pay | Admitting: Vascular Surgery

## 2012-12-17 DIAGNOSIS — H02105 Unspecified ectropion of left lower eyelid: Secondary | ICD-10-CM | POA: Diagnosis present

## 2012-12-17 LAB — BASIC METABOLIC PANEL
BUN: 37 mg/dL — ABNORMAL HIGH (ref 6–23)
Chloride: 104 mEq/L (ref 96–112)
GFR calc Af Amer: 44 mL/min — ABNORMAL LOW (ref >90)
GFR calc non Af Amer: 38 mL/min — ABNORMAL LOW (ref >90)
Potassium: 4.5 mEq/L (ref 3.5–5.1)

## 2012-12-17 LAB — GLUCOSE, CAPILLARY
Glucose-Capillary: 113 mg/dL — ABNORMAL HIGH (ref 70–99)
Glucose-Capillary: 121 mg/dL — ABNORMAL HIGH (ref 70–99)
Glucose-Capillary: 136 mg/dL — ABNORMAL HIGH (ref 70–99)

## 2012-12-17 LAB — CBC
HCT: 31.5 % — ABNORMAL LOW (ref 36.0–46.0)
MCHC: 33.3 g/dL (ref 30.0–36.0)
Platelets: 195 10*3/uL (ref 150–400)
RDW: 13.2 % (ref 11.5–15.5)
WBC: 6.9 10*3/uL (ref 4.0–10.5)

## 2012-12-17 LAB — C-REACTIVE PROTEIN: CRP: 9.4 mg/dL — ABNORMAL HIGH (ref <0.60)

## 2012-12-17 MED ORDER — METOPROLOL SUCCINATE ER 200 MG PO TB24: 100.0000 mg | ORAL_TABLET | Freq: Every day | ORAL | Status: DC

## 2012-12-17 MED ORDER — TERBINAFINE HCL 1 % EX CREA: TOPICAL_CREAM | Freq: Every day | CUTANEOUS | Status: DC

## 2012-12-17 MED ORDER — PREDNISONE 20 MG PO TABS: 40.0000 mg | ORAL_TABLET | Freq: Every day | ORAL | Status: AC

## 2012-12-17 MED ORDER — FLUCONAZOLE 50 MG PO TABS: 50.0000 mg | ORAL_TABLET | Freq: Every day | ORAL | Status: DC

## 2012-12-17 MED ORDER — FLUCONAZOLE 150 MG PO TABS: 150.0000 mg | ORAL_TABLET | ORAL | Status: DC

## 2012-12-17 MED ORDER — CEPHALEXIN 500 MG PO CAPS: 500.0000 mg | ORAL_CAPSULE | Freq: Two times a day (BID) | ORAL | Status: AC

## 2012-12-17 MED ORDER — ARTIFICIAL TEARS OP OINT: 1.0000 "application " | TOPICAL_OINTMENT | Freq: Every evening | OPHTHALMIC | Status: DC | PRN

## 2012-12-17 NOTE — Progress Notes (Signed)
Internal Medicine Teaching Service Attending Note Date: 12/17/2012  Patient name: Jane [name]  Medical record number: [id number]  Date of birth: 10/10/1928   [name] is doing much better. She does not have any more headache. Her exam today is unchanged from yesterday. She has less tenderness in her arm from the fall day before. Medically, she is stable to be discharged. Case management working with her for home health care.    We will follow up with her biopsy results.   We will arrange appropriate follow up visits for her, to monitor her headache, her steroid taper, and her kidney function and anemia over time.   She will need a podiatry appointment for her severely overgrown toe nails.  She will need home health services - PT/OT.   Please see the resident note for details of care plan.    Clarrisa Kaylor 12/17/2012, 1:03 PM.

## 2012-12-17 NOTE — Progress Notes (Signed)
Discharge home. Home discharge instruction given, no questions verbalized. 

## 2012-12-17 NOTE — Progress Notes (Signed)
Subjective: Patient reports no headache today.  Had the biopsy yesterday and tolerated it well.  Has no complaints today.  Remains on Prednisone.    Objective: Current vital signs: BP 157/82  Pulse 60  Temp(Src) 97.5 F (36.4 C) (Oral)  Resp 18  Ht 5' (1.524 m)  Wt 81.647 kg (180 lb)  BMI 35.15 kg/m2  SpO2 98% Vital signs in last 24 hours: Temp:  [97.5 F (36.4 C)-99.5 F (37.5 C)] 97.5 F (36.4 C) (06/19 0516) Pulse Rate:  [54-73] 60 (06/19 0516) Resp:  [18-28] 18 (06/19 0516) BP: (135-157)/(49-82) 157/82 mmHg (06/19 0516) SpO2:  [94 %-99 %] 98 % (06/19 0516)  Intake/Output from previous day: 06/18 0701 - 06/19 0700 In: 440 [P.O.:240; I.V.:200] Out: -  Intake/Output this shift: Total I/O In: 240 [P.O.:240] Out: -  Nutritional status: General  Neurologic Exam: Mental Status:  Alert, oriented. Speech fluent without evidence of aphasia. Able to follow 3 step commands without difficulty.  Cranial Nerves:  II: Discs flat bilaterally; Visual fields grossly normal, pupils equal, round, reactive to light and accommodation  III,IV, VI: Ectropion left lower lid, extra-ocular motions intact bilaterally  V,VII: smile symmetric, facial light touch sensation normal bilaterally  VIII: hearing normal bilaterally  IX,X: gag reflex present  XI: bilateral shoulder shrug  XII: midline tongue extension  Motor:  Moving all extremities antigravity  Sensory: Pinprick and light touch intact throughout, bilaterally but decreased bilateral LE from calf to foot due to sever PVD  Deep Tendon Reflexes: 2+ and symmetric throughout UE and bilateral KJ, no AJ  Plantars:  Mute bilaterally  Skin: Incision area clean and dry  Lab Results: Basic Metabolic Panel:  Recent Labs Lab 12/12/12 1228 12/13/12 0515 12/16/12 0635 12/17/12 0600  NA 138 139 138 140  K 3.8 3.6 3.5 4.5  CL 97 104 104 104  CO2 23 24 24 26   GLUCOSE 115* 97 93 142*  BUN 34* 27* 37* 37*  CREATININE 1.33* 1.24* 1.25*  1.27*  CALCIUM 9.1 8.2* 8.5 8.5    Liver Function Tests:  Recent Labs Lab 12/12/12 1228 12/13/12 0515  AST 20 23  ALT 12 12  ALKPHOS 65 51  BILITOT 0.9 0.6  PROT 8.3 6.8  ALBUMIN 3.4* 2.7*    Recent Labs Lab 12/12/12 1228  LIPASE 19   No results found for this basename: AMMONIA,  in the last 168 hours  CBC:  Recent Labs Lab 12/12/12 1228 12/13/12 0515 12/16/12 0635 12/17/12 0600  WBC 8.4 7.6 9.1 6.9  NEUTROABS 6.7  --   --   --   HGB 11.8* 10.0* 9.7* 10.5*  HCT 33.8* 29.7* 28.8* 31.5*  MCV 91.6 91.7 90.6 92.1  PLT 136* 123* 177 195    Cardiac Enzymes: No results found for this basename: CKTOTAL, CKMB, CKMBINDEX, TROPONINI,  in the last 168 hours  Lipid Panel: No results found for this basename: CHOL, TRIG, HDL, CHOLHDL, VLDL, LDLCALC,  in the last 168 hours  CBG:  Recent Labs Lab 12/17/12 0904  GLUCAP 113*    Microbiology: Results for orders placed during the hospital encounter of 12/12/12  CULTURE, BLOOD (ROUTINE X 2)     Status: None   Collection Time    12/12/12 11:40 AM      Result Value Range Status   Specimen Description BLOOD RIGHT ARM   Final   Special Requests BOTTLES DRAWN AEROBIC AND ANAEROBIC 10CC   Final   Culture  Setup Time 12/12/2012 18:52   Final  Culture     Final   Value:        BLOOD CULTURE RECEIVED NO GROWTH TO DATE CULTURE WILL BE HELD FOR 5 DAYS BEFORE ISSUING A FINAL NEGATIVE REPORT   Report Status PENDING   Incomplete  CULTURE, BLOOD (ROUTINE X 2)     Status: None   Collection Time    12/12/12 11:42 AM      Result Value Range Status   Specimen Description BLOOD LEFT ARM   Final   Special Requests BOTTLES DRAWN AEROBIC ONLY 10CC   Final   Culture  Setup Time 12/12/2012 18:52   Final   Culture     Final   Value:        BLOOD CULTURE RECEIVED NO GROWTH TO DATE CULTURE WILL BE HELD FOR 5 DAYS BEFORE ISSUING A FINAL NEGATIVE REPORT   Report Status PENDING   Incomplete  URINE CULTURE     Status: None   Collection Time     12/12/12  3:18 PM      Result Value Range Status   Specimen Description URINE, CATHETERIZED   Final   Special Requests NONE   Final   Culture  Setup Time 12/12/2012 21:33   Final   Colony Count NO GROWTH   Final   Culture NO GROWTH   Final   Report Status 12/13/2012 FINAL   Final  SURGICAL PCR SCREEN     Status: None   Collection Time    12/16/12  9:42 AM      Result Value Range Status   MRSA, PCR NEGATIVE  NEGATIVE Final   Staphylococcus aureus NEGATIVE  NEGATIVE Final   Comment:            The Xpert SA Assay (FDA     approved for NASAL specimens     in patients over 66 years of age),     is one component of     a comprehensive surveillance     program.  Test performance has     been validated by [employer] for patients greater     than or equal to 30 year old.     It is not intended     to diagnose infection nor to     guide or monitor treatment.    Coagulation Studies:  Recent Labs  12/16/12 0635  LABPROT 13.4  INR 1.03    Imaging: No results found.  Medications:  I have reviewed the patient's current medications. Scheduled: . artificial tears  1 application Both Eyes QHS,MR X 1  . aspirin EC  81 mg Oral Daily  . cephALEXin  500 mg Oral Q12H  . enoxaparin (LOVENOX) injection  40 mg Subcutaneous Q24H  . fluconazole  50 mg Oral Daily  . metoprolol succinate  200 mg Oral Daily  . pantoprazole  40 mg Oral Daily  . predniSONE  40 mg Oral Q breakfast  . terbinafine   Topical Daily    Assessment/Plan: 79 year old with presumed temporal arteritis.  CRP elevated at 9.4.  On steroids.  Biopsy results pending.  Recommendations: 1.  Continue steroids at current dose   LOS: 5 days   Thana Farr, MD [facility] [phone] 12/17/2012  11:24 AM

## 2012-12-17 NOTE — Progress Notes (Addendum)
Subjective:    Ms. Nouri was seen and examined at bedside this morning.  She reports feeling well this morning and is ready to go home.  She has agreed on home health care.  She denies any new vision disturbance, N/V/D, abdominal pain, chest pain, or shortness of breat at this time.    Objective:    Vital Signs:   Temp:  [97.5 F (36.4 C)-99.5 F (37.5 C)] 97.5 F (36.4 C) (06/19 0516) Pulse Rate:  [54-73] 60 (06/19 0516) Resp:  [18-20] 18 (06/19 0516) BP: (135-157)/(49-82) 157/82 mmHg (06/19 0516) SpO2:  [94 %-99 %] 98 % (06/19 0516) Last BM Date: 12/14/12   Physical Exam: General: Vital signs reviewed and noted. Well-developed, well-nourished, in no acute distress; alert, appropriate and cooperative throughout examination.  HEENT:  Everted L lower eyelid, erythema. No tenderness to palpation of temporal regions.  Lungs:  Normal respiratory effort. Clear to auscultation BL without crackles or wheezes.  Heart: irregular. S1 and S2 normal without gallop, murmur, or rubs.  Abdomen:  BS normoactive. Soft, Nondistended, non-tender.  No masses or organomegaly.  Extremities: No pretibial edema. Skin changes consistent with chronic venous disease. Multiple scars and excoriations and scabs, tender to palpation; long, thick, dried curled toenails into skin, tender to palpation on right foot more than left, malodorous.      Labs:  Basic Metabolic Panel:  Recent Labs Lab 12/12/12 1228 12/13/12 0515 12/16/12 0635 12/17/12 0600  NA 138 139 138 140  K 3.8 3.6 3.5 4.5  CL 97 104 104 104  CO2 23 24 24 26   GLUCOSE 115* 97 93 142*  BUN 34* 27* 37* 37*  CREATININE 1.33* 1.24* 1.25* 1.27*  CALCIUM 9.1 8.2* 8.5 8.5   Liver Function Tests:  Recent Labs Lab 12/12/12 1228 12/13/12 0515  AST 20 23  ALT 12 12  ALKPHOS 65 51  BILITOT 0.9 0.6  PROT 8.3 6.8  ALBUMIN 3.4* 2.7*    Recent Labs Lab 12/12/12 1228  LIPASE 19    CBC:  Recent Labs Lab 12/12/12 1228  12/13/12 0515 12/16/12 0635 12/17/12 0600  WBC 8.4 7.6 9.1 6.9  NEUTROABS 6.7  --   --   --   HGB 11.8* 10.0* 9.7* 10.5*  HCT 33.8* 29.7* 28.8* 31.5*  MCV 91.6 91.7 90.6 92.1  PLT 136* 123* 177 195   Microbiology: Results for orders placed during the hospital encounter of 12/12/12  CULTURE, BLOOD (ROUTINE X 2)     Status: None   Collection Time    12/12/12 11:40 AM      Result Value Range Status   Specimen Description BLOOD RIGHT ARM   Final   Special Requests BOTTLES DRAWN AEROBIC AND ANAEROBIC 10CC   Final   Culture  Setup Time 12/12/2012 18:52   Final   Culture     Final   Value:        BLOOD CULTURE RECEIVED NO GROWTH TO DATE CULTURE WILL BE HELD FOR 5 DAYS BEFORE ISSUING A FINAL NEGATIVE REPORT   Report Status PENDING   Incomplete  CULTURE, BLOOD (ROUTINE X 2)     Status: None   Collection Time    12/12/12 11:42 AM      Result Value Range Status   Specimen Description BLOOD LEFT ARM   Final   Special Requests BOTTLES DRAWN AEROBIC ONLY 10CC   Final   Culture  Setup Time 12/12/2012 18:52   Final   Culture     Final  Value:        BLOOD CULTURE RECEIVED NO GROWTH TO DATE CULTURE WILL BE HELD FOR 5 DAYS BEFORE ISSUING A FINAL NEGATIVE REPORT   Report Status PENDING   Incomplete  URINE CULTURE     Status: None   Collection Time    12/12/12  3:18 PM      Result Value Range Status   Specimen Description URINE, CATHETERIZED   Final   Special Requests NONE   Final   Culture  Setup Time 12/12/2012 21:33   Final   Colony Count NO GROWTH   Final   Culture NO GROWTH   Final   Report Status 12/13/2012 FINAL   Final  SURGICAL PCR SCREEN     Status: None   Collection Time    12/16/12  9:42 AM      Result Value Range Status   MRSA, PCR NEGATIVE  NEGATIVE Final   Staphylococcus aureus NEGATIVE  NEGATIVE Final   Comment:            The Xpert SA Assay (FDA     approved for NASAL specimens     in patients over 67 years of age),     is one component of     a comprehensive  surveillance     program.  Test performance has     been validated by [employer] for patients greater     than or equal to 66 year old.     It is not intended     to diagnose infection nor to     guide or monitor treatment.   Coagulation Studies:  Recent Labs  12/16/12 0635  LABPROT 13.4  INR 1.03     Medications:    Infusions:    Scheduled Medications: . artificial tears  1 application Both Eyes QHS,MR X 1  . aspirin EC  81 mg Oral Daily  . cephALEXin  500 mg Oral Q12H  . enoxaparin (LOVENOX) injection  40 mg Subcutaneous Q24H  . fluconazole  50 mg Oral Daily  . metoprolol succinate  200 mg Oral Daily  . pantoprazole  40 mg Oral Daily  . predniSONE  40 mg Oral Q breakfast  . terbinafine   Topical Daily   PRN Medications: oxyCODONE  Assessment/ Plan:   Ms. Northington is a 79 year old female with PMH of anemia, GERD, HTN, and Diverticulosis presenting with fever of unknown origin and headache.  CXR negative for airspace consolidation. CT head negative for any acute intracranial findings with stable cerebral atrophy and chronic small vessel disease. ?temporal arteritis  Headache and Fever - Tmax = 102.2 during admission.  Afebrile now. Initially noted to have tachycardia (HR ~120s now resolved). No leukocytosis. No identifiable source of infection. 2v CXR unrevealing of pneumonia (results discussed with radiology given presence of atelectasis). Urine culture negative. Initially complained consistently of temporal headache with fever, elevated ESR 83 with absence of other symptoms or a source of infection, and denies vision change; concern for temporal arteritis and started on prednisone 12/14/12. S/p temporal artery biopsy 12/16/12.   - appreciate neurology following, CRP elevated to 9.4, continue prednisone - f/u temporal artery biopsy - continue prednisone 40mg  QD (x2-4 weeks followed by taper) - blood cx x2 NGTD  Dehydration--resolved.  initially with decreased PO  intake and tachycardia, improved with fluids (2L bolus in ED and NS @125cc /hr). Pt insists she cares for herself and cooks for herself at home, however her family  members are concerned about her ability to do so on her own.  Not noted to be orthostatic.  - PT/OT eval--home health PT and OT - monitoring Cr, slightly increased to 1.27 today  Ectropion L lower eyelid--everted lower eyelid. Pt unable to give an accurate history as to when this issue began, however it appears it has been present and unchanged for at least 5-6 months. Reports fall at least 1 year ago resulting in "bump" on left eye but says eyelid change happened after the injury.  She has been prescribed erythromycin ophthalmic ointment for possible infection associated with the issue, however there does not appear to be any clear signs of infection at time of admission.  - artificial tear ointment to L eyelid  Chronic malformed toe nails and onychomycosis with ?cellulitis and chronic venous insufficiency: chronic poor hygiene of feet and no recent toe nail clipping, has not followed up with podiatry.  Appears to have fungal infection of toes.  Assessed by wound care: foam dressing to legs to promote healing and topical antifungal ointment to toes as well.   -start Fluoconazole 50mg  po qd (will not use terbinafine at this time due to poor outpatient follow up hx), day 2 -start Cephalexin 500mg  po bid x5 days for superimposed infection, day 2 -will need podiatry follow up as outpatient -follow up with vascular as outpatient  Anemia--tending down Hb during admission.  Today Hb back up to 10.5, down from 11.8 on admission.  Baseline Hb appears ~11.  No obvious source of bleeding at this time.  Denies hematemesis or melena or hematochezia.  Platelets increased to 177 today from 136 on admission.  INR 1.03.   -monitor Hb -will need to follow up with PCP as outpatient  Hx stroke - remote (1975). -continue ASA at this time.   Decreased  balance--noted to have decreased safety awareness and balance by PT.  PT and OT recommend home health PT and OT at least 3x/week and 24 hour supervision/assistance and rolling walker with 5" wheels.   -home health needs: PT/OT/RN/Aide, and Child psychotherapist -PT/OT -rolling walker   DVT PPX - lovenox  CODE STATUS - full  CONSULTS PLACED -  DISPO - d/c today  Anticipated discharge in approximately 1-2 day(s).   The patient does have a current PCP (Bosie Clos, KAREN, MD) and does need an [facility] hospital follow-up appointment after discharge.    Does the patient have transportation limitations that hinder transportation to clinic appointments? unknown   SERVICE NEEDED AT DISCHARGE - TO BE DETERMINED DURING HOSPITAL COURSE         Y = Yes, Blank = No PT: Home health PT  OT: Home health OT, 24 hours supervision/assistance  RN:   Equipment: Rolling walker  Other:     Length of Stay: 5 day(s)   Signed: Darden Palmer, MD  PGY-1, Internal Medicine Resident Pager: [phone] (7AM-5PM) 12/17/2012, 12:43 PM

## 2012-12-18 ENCOUNTER — Other Ambulatory Visit: Payer: Self-pay | Admitting: Internal Medicine

## 2012-12-18 LAB — CULTURE, BLOOD (ROUTINE X 2): Culture: NO GROWTH

## 2012-12-19 DIAGNOSIS — D649 Anemia, unspecified: Secondary | ICD-10-CM | POA: Diagnosis not present

## 2012-12-19 DIAGNOSIS — I10 Essential (primary) hypertension: Secondary | ICD-10-CM | POA: Diagnosis not present

## 2012-12-19 DIAGNOSIS — I69998 Other sequelae following unspecified cerebrovascular disease: Secondary | ICD-10-CM | POA: Diagnosis not present

## 2012-12-19 DIAGNOSIS — R209 Unspecified disturbances of skin sensation: Secondary | ICD-10-CM | POA: Diagnosis not present

## 2012-12-19 DIAGNOSIS — Z4800 Encounter for change or removal of nonsurgical wound dressing: Secondary | ICD-10-CM | POA: Diagnosis not present

## 2012-12-19 DIAGNOSIS — L97809 Non-pressure chronic ulcer of other part of unspecified lower leg with unspecified severity: Secondary | ICD-10-CM | POA: Diagnosis not present

## 2012-12-19 DIAGNOSIS — F411 Generalized anxiety disorder: Secondary | ICD-10-CM | POA: Diagnosis not present

## 2012-12-19 DIAGNOSIS — Z602 Problems related to living alone: Secondary | ICD-10-CM | POA: Diagnosis not present

## 2012-12-20 DIAGNOSIS — R2689 Other abnormalities of gait and mobility: Secondary | ICD-10-CM | POA: Diagnosis present

## 2012-12-20 NOTE — Discharge Summary (Signed)
Name: Jane [name] MRN: [id number] DOB: [birth year] 79 y.o. PCP: Manuela Schwartz, MD  Date of Admission: 12/12/2012 11:51 AM Date of Discharge: 12/20/2012 Attending Physician: Dr. Dalphine Handing Discharge Diagnosis: Principal Problem:   Temporal arteritis Active Problems:   HYPERTENSION   Chronic venous insufficiency b/l lower extremities   Ectropion of left lower eyelid   Unstable balance   ANEMIA, IRON DEFICIENCY NOS   C V A / STROKE   Dehydration  Discharge Medications:   Medication List    STOP taking these medications       chlorthalidone 25 MG tablet  Commonly known as:  HYGROTON     erythromycin ophthalmic ointment      TAKE these medications       artificial tears Oint ophthalmic ointment  Apply 1 application to eye at bedtime and may repeat dose one time if needed. A thin layer of ointment should be applied to the exposed eyelid, taking care not to touch tip of applicator to eyelid; if complaints of ocular dryness, notify MD     aspirin EC 81 MG tablet  Take 81 mg by mouth daily.     cephALEXin 500 MG capsule  Commonly known as:  KEFLEX  Take 1 capsule (500 mg total) by mouth every 12 (twelve) hours.     Ferrous Gluconate 256 (28 FE) MG Tabs  Commonly known as:  FERATE  Take 256 mg by mouth daily.     fluconazole 150 MG tablet  Commonly known as:  DIFLUCAN  Take 1 tablet (150 mg total) by mouth once a week.     losartan 50 MG tablet  Commonly known as:  COZAAR  Take 1 tablet (50 mg total) by mouth daily.     metoprolol 200 MG 24 hr tablet  Commonly known as:  TOPROL-XL  Take 0.5 tablets (100 mg total) by mouth daily.     omeprazole 20 MG capsule  Commonly known as:  PRILOSEC  Take 20 mg by mouth daily. Will sometimes take twice daily if heartburn increases.     predniSONE 20 MG tablet  Commonly known as:  DELTASONE  Take 2 tablets (40 mg total) by mouth daily with breakfast.     terbinafine 1 % cream  Commonly known as:  LAMISIL    Apply topically daily. Apply to affected toes       Disposition and follow-up:   Ms.Loral J Lowden was discharged from [facility] in Stable condition.  At the hospital follow up visit please address:  1.  Presumed Temporal Arteritis--headache and fever during admission.  Discharged on prednisone s/p left temporal artery biopsy, will need long taper: 40mg  qd 2-4 weeks, then start long taper by reduction in dose by 10% every 1-2 weeks and after reaching 10mg , taper in 1mg  increments each month. Biopsy benign.   Negative blood cultures x2 12/12/12.   2.  AKI: Cr 1.33 on admission, down to 1.27 with hydration 3.  Ectropion L lower eyelid--prescribed artifical tear ointment per pharmacy, follow up ophthalmology 4.  Onychomycosis with superficial infection--started on fluconazole q weekly x12 weeks and cephalexin for right foot cellulitis x5 days.  Needs podiatry follow up 5.  Chronic lower extremity venous insufficiency--needs vascular follow up 6.  Anemia--Hb 10.5 on discharge, baseline ~11 7.  Unstable balance--discharged with home health PT/OT/RN/Aide/Social work and needs 24 hour supervision and assistance along with rolling walker for support.    2.  Labs / imaging needed at time of follow-up:  repeat bmet, monitor renal function, consider repeat cbc to monitor Hb.   3.  Pending labs/ test needing follow-up: L temporal artery biopsy 12/16/12--benign  Follow-up Appointments:     Follow-up Information   Follow up with Bronson Curb, MD On 12/24/2012. (@ 945am)    Contact information:   [address] [place] [address] [phone]       Follow up with [facility] On 12/23/2012. (@1030am )    Contact information:   [address] [place] [address] [phone]     Discharge Instructions: Discharge Orders   Future Appointments Provider Department Dept Phone   12/23/2012 10:30 AM Emeline Darling [facility]  [phone]   12/24/2012 9:45 AM Bronson Curb, MD [facility] [phone]   Future Orders Complete By Expires     Call MD for:  persistant dizziness or light-headedness  As directed     Call MD for:  severe uncontrolled pain  As directed     Call MD for:  temperature >100.4  As directed     Diet - low sodium heart healthy  As directed     Increase activity slowly  As directed       Consultations: Neurology and Vascular  Procedures Performed:  X-ray Chest Pa And Lateral   12/12/2012   **ADDENDUM** CREATED: 12/12/2012 20:52:29  On the lateral views, there is no airspace consolidation. Therefore, the increased density at the left lateral lung base on the frontal view represented overlapping of the heart and soft tissues.  **END ADDENDUM** SIGNED BY: Londell Moh. Azucena Kuba, M.D.  12/12/2012   *RADIOLOGY REPORT*  Clinical Data: Shortness of breath.  CHEST - 2 VIEW  Comparison: Earlier today.  Findings: Stable enlargement of the cardiac silhouette and prominence of the pulmonary vasculature and interstitial markings. Increased density at the left lateral lung base.  Diffuse osteopenia.  IMPRESSION:  1.  Increased density at the left lateral lung base, most likely representing atelectasis and superimposed soft tissues. 2.  Stable cardiomegaly, pulmonary vascular congestion and changes of COPD.   Original Report Authenticated By: Beckie Salts, M.D.   Ct Head Wo Contrast  12/12/2012   *RADIOLOGY REPORT*  Clinical Data: Severe headache.  Tachycardia.  Anemia. Hypertension.  Previous stroke.  CT HEAD WITHOUT CONTRAST  Technique:  Contiguous axial images were obtained from the base of the skull through the vertex without contrast.  Comparison: 12/19/2010  Findings: There is no evidence of intracranial hemorrhage, brain edema or other signs of acute infarction.  There is no evidence of intracranial mass lesion or mass effect.  No abnormal extra-axial fluid collections are identified.  Mild to  moderate cerebral atrophy is stable.  Moderate chronic small vessel disease is also stable in appearance.  Ventricles are normal in size.  No skull abnormality identified.  IMPRESSION:  1.  No acute intracranial findings. 2.  Stable cerebral atrophy and chronic small vessel disease.   Original Report Authenticated By: Myles Rosenthal, M.D.   Dg Chest Portable 1 View  12/12/2012   *RADIOLOGY REPORT*  Clinical Data: Headache, shortness of breath  PORTABLE CHEST - 1 VIEW  Comparison: 12/05/2010  Findings: Cardiomegaly again noted.  No acute infiltrate or pleural effusion.  No pulmonary edema.  Mild hyperinflation again noted.  IMPRESSION: Cardiomegaly.  Mild hyperinflation.  No active disease.   Original Report Authenticated By: Natasha Mead, M.D.   Admission HPI: Pt is a 79 y.o. woman with PMH significant for  hypertension, HLD, remote prior CVA, chronic venous insufficiency, who presents to the ED with complaints of bitemporal headache which has persisted for 3 days. She admits to decreased consumption of food recently due to decreased appetite and states she always drinks very little liquids. She denies fever/chills. Denies dysuria, hematuria, or suprapubic/abdominal pain. Denies CP, SOB, or cough. Aside from her headache she states she otherwise feels well.  Ms. Panchal lives alone in a one-bedroom apartment. She does all of her own cooking and has no assistance at her residence. She states she has been taking her medications as prescribed.   Hospital Course by problem list: Principal Problem:   Temporal arteritis Active Problems:   HYPERTENSION   Chronic venous insufficiency b/l lower extremities   Ectropion of left lower eyelid   Unstable balance   ANEMIA, IRON DEFICIENCY NOS   C V A / STROKE   Dehydration  Temporal Arteritis--presumed diagnosis although negative L temporal artery biopsy.  Presented with complaints of bilateral temporal headache and Fever withTmax = 102.2 during admission but denied  any vision change.  Elevated ESR of 83 and CRP 9.4.  No leukocytosis and no other identifiable source of infection. 2v CXR unrevealing of pneumonia. Blood and urine cultures negative. In the absence of other symptoms or a source of infection, concern for temporal arteritis and started on prednisone 12/14/12. Consulted neurology who agree with biopsy and consulted vascular who did the biopsy on 12/16/12--results benign biopsy.  Fever resolved and headache improved during hospital course after starting prednisone.  Discharged on long prednisone taper: will need to be on 40mg  qd for at least 2-4 weeks and then start slow taper with decrease in prednisone dose by 10% every 1-2 weeks.  After reaching 10mg  of prednisone, taper in 1mg  increments each month.  Follow up with pcp and neurology as needed.    Dehydration--Resolved. Initially with decreased PO intake and tachycardia, improved with fluids (2L bolus in ED and NS @125cc /hr). Pt insists she cares for herself and cooks for herself at home, however her family members are concerned about her ability to do so on her own. Not noted to be orthostatic. Cr increased during admission and 1.27 on discharge.  Discharged home with home health, PT/OT, RN/Aide/and social work.  Follow up with pcp, repeat bmet to monitor renal function.  Encouraged adequate po and fluid intake.   Ectropion L lower eyelid--everted lower eyelid. She was unable to give an accurate history as to when this issue began, however it appears it has been present and unchanged for at least 5-6 months. Reports having a fall at least 1 year ago resulting in "bump" on left eye but says eyelid change happened after the injury. She has been prescribed erythromycin ophthalmic ointment in the past for possible infection associated with the issue, however there does not appear to be any clear signs of infection at time of admission and no reported change in the past.  Per pharmacy recommendations, started on  artifical tear ointment to L eyelid.  Has been referred to opthalmology in the past but does not seem to have followed up.  Recommend following up with them again and with pcp as outpatient.  Chronic malformed toe nails and onychomycosis with ?cellulitis and chronic venous insufficiency: chronic poor hygiene of feet and no recent toe nail clipping, has not followed up with podiatry (has been referred in the past). Appears to have fungal infection of toes along with superinfection on top of right foot that is painful.  Lower extremities were assessed by wound care who recommended foam dressing to legs to promote healing and topical antifungal ointment to toes as well. She was also started on weekly Fluoconazole 150mg  po q weekly x12 weeks (did not use terbinafine at this time due to poor outpatient follow up hx).  Also started on Cephalexin 500mg  po bid x5 days for superimposed infection.  Recommend podiatry and vascular follow up as outpatient and with pcp.    Anemia, normocytic--tending down Hb during admission. Hb 11.8 on admission and down to 10.5 on discharge.  Baseline Hb appears ~11. No obvious source of bleeding at this time. Denies hematemesis or melena or hematochezia. Platelets were initially low, 136 on admission and increased to 195 on discharge. INR 1.03. Iron panel 2009 wnl.  Recommend follow up with pcp.   Hx stroke - remote (1975). On ASA at home and continued during hospitalization and on discharge.    Unstable balance--noted to have decreased safety awareness and balance by PT. PT and OT recommend home health PT and OT at least 3x/week and 24 hour supervision/assistance and rolling walker with 5" wheels. Discharged with home health needs: PT/OT/RN/Aide, and Child psychotherapist and with rolling walker.  Follow up with PT/OT and with pcp.   Discharge Vitals:   BP 146/62  Pulse 72  Temp(Src) 98.5 F (36.9 C) (Oral)  Resp 18  Ht 5' (1.524 m)  Wt 180 lb (81.647 kg)  BMI 35.15 kg/m2  SpO2  98%  Discharge Labs:  Basic Metabolic Panel:  Recent Labs Lab 12/16/12 0635 12/17/12 0600  NA 138 140  K 3.5 4.5  CL 104 104  CO2 24 26  GLUCOSE 93 142*  BUN 37* 37*  CREATININE 1.25* 1.27*  CALCIUM 8.5 8.5   CBC:  Recent Labs Lab 12/16/12 0635 12/17/12 0600  WBC 9.1 6.9  HGB 9.7* 10.5*  HCT 28.8* 31.5*  MCV 90.6 92.1  PLT 177 195   CBG:  Recent Labs Lab 12/17/12 0904 12/17/12 1205 12/17/12 1612  GLUCAP 113* 121* 136*   Coagulation:  Recent Labs Lab 12/16/12 0635  LABPROT 13.4  INR 1.03   Signed: Darden Palmer, MD 12/20/2012, 2:41 PM   Time Spent on Discharge: 35 minutes Services Ordered on Discharge: home health PT/OT Equipment Ordered on Discharge: rolling walker

## 2012-12-21 ENCOUNTER — Other Ambulatory Visit: Payer: Self-pay | Admitting: *Deleted

## 2012-12-21 ENCOUNTER — Other Ambulatory Visit: Payer: Self-pay | Admitting: Internal Medicine

## 2012-12-21 DIAGNOSIS — I10 Essential (primary) hypertension: Secondary | ICD-10-CM

## 2012-12-21 MED ORDER — METOPROLOL SUCCINATE ER 200 MG PO TB24: 100.0000 mg | ORAL_TABLET | Freq: Every day | ORAL | Status: DC

## 2012-12-21 NOTE — Discharge Summary (Signed)
Internal Medicine Teaching Service Attending Note Date: 12/21/2012  Patient name: Jane [name]  Medical record number: [id number]  Date of birth: 1929/04/09    I evaluated the patient on the day of discharge and discussed the discharge plan with my resident team. I agree with the discharge documentation and disposition.     Thanks Aletta Edouard 12/21/2012, 5:11 PM

## 2012-12-21 NOTE — Telephone Encounter (Signed)
Rx called in to pharmacy. 

## 2012-12-21 NOTE — Telephone Encounter (Signed)
Metoprol 200 mg, 30 tabs, 6 refills.

## 2012-12-22 DIAGNOSIS — D649 Anemia, unspecified: Secondary | ICD-10-CM | POA: Diagnosis not present

## 2012-12-22 DIAGNOSIS — L97809 Non-pressure chronic ulcer of other part of unspecified lower leg with unspecified severity: Secondary | ICD-10-CM | POA: Diagnosis not present

## 2012-12-22 DIAGNOSIS — F411 Generalized anxiety disorder: Secondary | ICD-10-CM | POA: Diagnosis not present

## 2012-12-22 DIAGNOSIS — R209 Unspecified disturbances of skin sensation: Secondary | ICD-10-CM | POA: Diagnosis not present

## 2012-12-22 DIAGNOSIS — I10 Essential (primary) hypertension: Secondary | ICD-10-CM | POA: Diagnosis not present

## 2012-12-23 ENCOUNTER — Ambulatory Visit: Payer: Self-pay | Admitting: Podiatry

## 2012-12-23 DIAGNOSIS — I69998 Other sequelae following unspecified cerebrovascular disease: Secondary | ICD-10-CM | POA: Diagnosis not present

## 2012-12-23 DIAGNOSIS — D649 Anemia, unspecified: Secondary | ICD-10-CM | POA: Diagnosis not present

## 2012-12-23 DIAGNOSIS — I10 Essential (primary) hypertension: Secondary | ICD-10-CM | POA: Diagnosis not present

## 2012-12-23 DIAGNOSIS — L97809 Non-pressure chronic ulcer of other part of unspecified lower leg with unspecified severity: Secondary | ICD-10-CM | POA: Diagnosis not present

## 2012-12-23 DIAGNOSIS — F411 Generalized anxiety disorder: Secondary | ICD-10-CM | POA: Diagnosis not present

## 2012-12-23 DIAGNOSIS — R209 Unspecified disturbances of skin sensation: Secondary | ICD-10-CM | POA: Diagnosis not present

## 2012-12-24 ENCOUNTER — Ambulatory Visit: Payer: Medicare Other | Admitting: Internal Medicine

## 2012-12-25 DIAGNOSIS — D649 Anemia, unspecified: Secondary | ICD-10-CM | POA: Diagnosis not present

## 2012-12-25 DIAGNOSIS — L97809 Non-pressure chronic ulcer of other part of unspecified lower leg with unspecified severity: Secondary | ICD-10-CM | POA: Diagnosis not present

## 2012-12-25 DIAGNOSIS — R209 Unspecified disturbances of skin sensation: Secondary | ICD-10-CM | POA: Diagnosis not present

## 2012-12-25 DIAGNOSIS — I69998 Other sequelae following unspecified cerebrovascular disease: Secondary | ICD-10-CM | POA: Diagnosis not present

## 2012-12-25 DIAGNOSIS — F411 Generalized anxiety disorder: Secondary | ICD-10-CM | POA: Diagnosis not present

## 2012-12-25 DIAGNOSIS — I10 Essential (primary) hypertension: Secondary | ICD-10-CM | POA: Diagnosis not present

## 2012-12-27 ENCOUNTER — Emergency Department (HOSPITAL_COMMUNITY): Payer: Medicare Other

## 2012-12-27 ENCOUNTER — Inpatient Hospital Stay (HOSPITAL_COMMUNITY)
Admission: EM | Admit: 2012-12-27 | Discharge: 2012-12-29 | DRG: 293 | Disposition: A | Discharge status: Home Health | Payer: Medicare Other | Attending: Internal Medicine | Admitting: Internal Medicine

## 2012-12-27 ENCOUNTER — Encounter (HOSPITAL_COMMUNITY): Payer: Self-pay | Admitting: Emergency Medicine

## 2012-12-27 DIAGNOSIS — K219 Gastro-esophageal reflux disease without esophagitis: Secondary | ICD-10-CM | POA: Diagnosis present

## 2012-12-27 DIAGNOSIS — M129 Arthropathy, unspecified: Secondary | ICD-10-CM | POA: Diagnosis present

## 2012-12-27 DIAGNOSIS — E559 Vitamin D deficiency, unspecified: Secondary | ICD-10-CM | POA: Diagnosis present

## 2012-12-27 DIAGNOSIS — D509 Iron deficiency anemia, unspecified: Secondary | ICD-10-CM

## 2012-12-27 DIAGNOSIS — I5030 Unspecified diastolic (congestive) heart failure: Secondary | ICD-10-CM

## 2012-12-27 DIAGNOSIS — I129 Hypertensive chronic kidney disease with stage 1 through stage 4 chronic kidney disease, or unspecified chronic kidney disease: Secondary | ICD-10-CM | POA: Diagnosis present

## 2012-12-27 DIAGNOSIS — R209 Unspecified disturbances of skin sensation: Secondary | ICD-10-CM | POA: Diagnosis not present

## 2012-12-27 DIAGNOSIS — T380X5A Adverse effect of glucocorticoids and synthetic analogues, initial encounter: Secondary | ICD-10-CM | POA: Diagnosis present

## 2012-12-27 DIAGNOSIS — F411 Generalized anxiety disorder: Secondary | ICD-10-CM | POA: Diagnosis present

## 2012-12-27 DIAGNOSIS — R0989 Other specified symptoms and signs involving the circulatory and respiratory systems: Secondary | ICD-10-CM | POA: Diagnosis not present

## 2012-12-27 DIAGNOSIS — R0602 Shortness of breath: Secondary | ICD-10-CM | POA: Diagnosis not present

## 2012-12-27 DIAGNOSIS — Z79899 Other long term (current) drug therapy: Secondary | ICD-10-CM | POA: Diagnosis not present

## 2012-12-27 DIAGNOSIS — B351 Tinea unguium: Secondary | ICD-10-CM | POA: Diagnosis present

## 2012-12-27 DIAGNOSIS — I5021 Acute systolic (congestive) heart failure: Secondary | ICD-10-CM | POA: Diagnosis not present

## 2012-12-27 DIAGNOSIS — E785 Hyperlipidemia, unspecified: Secondary | ICD-10-CM | POA: Diagnosis present

## 2012-12-27 DIAGNOSIS — N183 Chronic kidney disease, stage 3 unspecified: Secondary | ICD-10-CM | POA: Diagnosis present

## 2012-12-27 DIAGNOSIS — I69998 Other sequelae following unspecified cerebrovascular disease: Secondary | ICD-10-CM | POA: Diagnosis not present

## 2012-12-27 DIAGNOSIS — IMO0002 Reserved for concepts with insufficient information to code with codable children: Secondary | ICD-10-CM | POA: Diagnosis not present

## 2012-12-27 DIAGNOSIS — I509 Heart failure, unspecified: Secondary | ICD-10-CM | POA: Diagnosis not present

## 2012-12-27 DIAGNOSIS — Z8673 Personal history of transient ischemic attack (TIA), and cerebral infarction without residual deficits: Secondary | ICD-10-CM

## 2012-12-27 DIAGNOSIS — L97809 Non-pressure chronic ulcer of other part of unspecified lower leg with unspecified severity: Secondary | ICD-10-CM | POA: Diagnosis not present

## 2012-12-27 DIAGNOSIS — D649 Anemia, unspecified: Secondary | ICD-10-CM | POA: Diagnosis present

## 2012-12-27 DIAGNOSIS — Z87891 Personal history of nicotine dependence: Secondary | ICD-10-CM | POA: Diagnosis not present

## 2012-12-27 DIAGNOSIS — I10 Essential (primary) hypertension: Secondary | ICD-10-CM | POA: Diagnosis not present

## 2012-12-27 DIAGNOSIS — I359 Nonrheumatic aortic valve disorder, unspecified: Secondary | ICD-10-CM | POA: Diagnosis present

## 2012-12-27 DIAGNOSIS — H02109 Unspecified ectropion of unspecified eye, unspecified eyelid: Secondary | ICD-10-CM | POA: Diagnosis present

## 2012-12-27 DIAGNOSIS — R0600 Dyspnea, unspecified: Secondary | ICD-10-CM

## 2012-12-27 DIAGNOSIS — H02105 Unspecified ectropion of left lower eyelid: Secondary | ICD-10-CM | POA: Diagnosis present

## 2012-12-27 DIAGNOSIS — R0609 Other forms of dyspnea: Secondary | ICD-10-CM | POA: Diagnosis not present

## 2012-12-27 HISTORY — DX: Chronic kidney disease, stage 3 (moderate): N18.3

## 2012-12-27 HISTORY — DX: Chronic kidney disease, stage 3 unspecified: N18.30

## 2012-12-27 LAB — CBC WITH DIFFERENTIAL/PLATELET
Basophils Relative: 0 % (ref 0–1)
Hemoglobin: 10.3 g/dL — ABNORMAL LOW (ref 12.0–15.0)
Lymphs Abs: 1.6 10*3/uL (ref 0.7–4.0)
Monocytes Relative: 6 % (ref 3–12)
Neutro Abs: 6.6 10*3/uL (ref 1.7–7.7)
Neutrophils Relative %: 76 % (ref 43–77)
RBC: 3.31 MIL/uL — ABNORMAL LOW (ref 3.87–5.11)

## 2012-12-27 LAB — BASIC METABOLIC PANEL
BUN: 27 mg/dL — ABNORMAL HIGH (ref 6–23)
Chloride: 107 mEq/L (ref 96–112)
GFR calc Af Amer: 58 mL/min — ABNORMAL LOW (ref >90)
Glucose, Bld: 83 mg/dL (ref 70–99)
Potassium: 4.2 mEq/L (ref 3.5–5.1)

## 2012-12-27 LAB — D-DIMER, QUANTITATIVE: D-Dimer, Quant: 3.57 ug/mL-FEU — ABNORMAL HIGH (ref 0.00–0.48)

## 2012-12-27 LAB — PRO B NATRIURETIC PEPTIDE: Pro B Natriuretic peptide (BNP): 1339 pg/mL — ABNORMAL HIGH (ref 0–450)

## 2012-12-27 MED ORDER — ACETAMINOPHEN 325 MG PO TABS
650.0000 mg | ORAL_TABLET | Freq: Four times a day (QID) | ORAL | Status: DC | PRN
  Administered 2012-12-27 – 2012-12-29 (×2): 650 mg via ORAL
  Filled 2012-12-27 (×2): qty 2

## 2012-12-27 MED ORDER — SODIUM CHLORIDE 0.9 % IJ SOLN
3.0000 mL | Freq: Two times a day (BID) | INTRAMUSCULAR | Status: DC
  Administered 2012-12-27 – 2012-12-29 (×5): 3 mL via INTRAVENOUS

## 2012-12-27 MED ORDER — ACETAMINOPHEN 650 MG RE SUPP: 650.0000 mg | Freq: Four times a day (QID) | RECTAL | Status: DC | PRN

## 2012-12-27 MED ORDER — LOSARTAN POTASSIUM 50 MG PO TABS
50.0000 mg | ORAL_TABLET | Freq: Every day | ORAL | Status: DC
  Administered 2012-12-27 – 2012-12-29 (×3): 50 mg via ORAL
  Filled 2012-12-27 (×3): qty 1

## 2012-12-27 MED ORDER — TERBINAFINE HCL 1 % EX CREA
TOPICAL_CREAM | Freq: Every day | CUTANEOUS | Status: DC
  Administered 2012-12-27 – 2012-12-29 (×3): via TOPICAL
  Filled 2012-12-27: qty 12

## 2012-12-27 MED ORDER — FUROSEMIDE 10 MG/ML IJ SOLN
40.0000 mg | Freq: Once | INTRAMUSCULAR | Status: AC
  Administered 2012-12-27: 40 mg via INTRAVENOUS
  Filled 2012-12-27: qty 4

## 2012-12-27 MED ORDER — ENOXAPARIN SODIUM 40 MG/0.4ML ~~LOC~~ SOLN
40.0000 mg | SUBCUTANEOUS | Status: DC
  Administered 2012-12-27 – 2012-12-28 (×2): 40 mg via SUBCUTANEOUS
  Filled 2012-12-27 (×3): qty 0.4

## 2012-12-27 MED ORDER — FERROUS GLUCONATE 324 (38 FE) MG PO TABS
324.0000 mg | ORAL_TABLET | Freq: Every day | ORAL | Status: DC
  Administered 2012-12-27 – 2012-12-29 (×3): 324 mg via ORAL
  Filled 2012-12-27 (×3): qty 1

## 2012-12-27 MED ORDER — IOHEXOL 350 MG/ML SOLN
80.0000 mL | Freq: Once | INTRAVENOUS | Status: AC | PRN
  Administered 2012-12-27: 80 mL via INTRAVENOUS

## 2012-12-27 MED ORDER — SODIUM CHLORIDE 0.9 % IJ SOLN
3.0000 mL | INTRAMUSCULAR | Status: DC | PRN
  Administered 2012-12-28 (×2): 3 mL via INTRAVENOUS

## 2012-12-27 MED ORDER — ARTIFICIAL TEARS OP OINT
1.0000 "application " | TOPICAL_OINTMENT | Freq: Every evening | OPHTHALMIC | Status: DC | PRN
  Administered 2012-12-27 – 2012-12-28 (×2): 1 via OPHTHALMIC
  Filled 2012-12-27: qty 3.5

## 2012-12-27 MED ORDER — ASPIRIN EC 81 MG PO TBEC
81.0000 mg | DELAYED_RELEASE_TABLET | Freq: Every day | ORAL | Status: DC
  Administered 2012-12-27 – 2012-12-29 (×3): 81 mg via ORAL
  Filled 2012-12-27 (×3): qty 1

## 2012-12-27 MED ORDER — PANTOPRAZOLE SODIUM 20 MG PO TBEC
20.0000 mg | DELAYED_RELEASE_TABLET | Freq: Every day | ORAL | Status: DC
  Administered 2012-12-27 – 2012-12-29 (×3): 20 mg via ORAL
  Filled 2012-12-27 (×3): qty 1

## 2012-12-27 MED ORDER — SODIUM CHLORIDE 0.9 % IV SOLN: 250.0000 mL | INTRAVENOUS | Status: DC | PRN

## 2012-12-27 MED ORDER — METOPROLOL SUCCINATE ER 100 MG PO TB24
100.0000 mg | ORAL_TABLET | Freq: Every day | ORAL | Status: DC
  Administered 2012-12-27 – 2012-12-29 (×3): 100 mg via ORAL
  Filled 2012-12-27 (×4): qty 1

## 2012-12-27 NOTE — Progress Notes (Signed)
Assessment and admission Hx completed, pt AO x 4, denies any pain or discomfort at this time, pt on RA no distress noticed, CHF education started, pt oriented about fluid restriction, daily weight, hourly rounding and fall precaution. Pt encouraged to call for assistance to get OOB to prevent any fall or injury while in the hospital. We'll continue with POC.

## 2012-12-27 NOTE — ED Provider Notes (Signed)
History    CSN: [id number] Arrival date & time 12/27/12  6213  First MD Initiated Contact with Patient 12/27/12 [phone]     Chief Complaint  Patient presents with  . Shortness of Breath   (Consider location/radiation/quality/duration/timing/severity/associated sxs/prior Treatment) Patient is a 79 y.o. female presenting with shortness of breath. The history is provided by the patient.  Shortness of Breath She had onset about one hour ago of dyspnea. She denies chest pain, heaviness, tightness, pressure. She denies cough. Denies nausea, vomiting, diarrhea. Nothing makes her symptoms better nothing makes it worse. Past Medical History  Diagnosis Date  . Anemia   . Anxiety   . Arthritis   . GERD (gastroesophageal reflux disease)   . Hyperlipidemia   . Hypertension   . Vitamin D deficiency   . Colon polyp     2009 colonoscopy  . Hiatal hernia   . Diverticulosis   . Aortic insufficiency     mild  . Stroke 1975   Past Surgical History  Procedure Laterality Date  . Appendectomy    . Abdominal hysterectomy    . Artery biopsy Left 12/16/2012    Procedure: BIOPSY TEMPORAL ARTERY;  Surgeon: Pryor Ochoa, MD;  Location: [facility] OR;  Service: Vascular;  Laterality: Left;   Family History  Problem Relation Age of Onset  . Stroke Mother   . Hypertension Mother   . Stroke Father   . Hypertension Father   . Diabetes Sister    History  Substance Use Topics  . Smoking status: Former Smoker    Types: Cigarettes    Quit date: 07/01/1984  . Smokeless tobacco: Not on file  . Alcohol Use: No   OB History   Grav Para Term Preterm Abortions TAB SAB Ect Mult Living                 Review of Systems  Respiratory: Positive for shortness of breath.   All other systems reviewed and are negative.    Allergies  Review of patient's allergies indicates no known allergies.  Home Medications   Current Outpatient Rx  Name  Route  Sig  Dispense  Refill  . artificial tears (LACRILUBE) OINT  ophthalmic ointment   Ophthalmic   Apply 1 application to eye at bedtime and may repeat dose one time if needed. A thin layer of ointment should be applied to the exposed eyelid, taking care not to touch tip of applicator to eyelid; if complaints of ocular dryness, notify MD   1 Tube   0   . aspirin EC 81 MG tablet   Oral   Take 81 mg by mouth daily.         . Ferrous Gluconate (FERATE) 256 (28 FE) MG TABS   Oral   Take 256 mg by mouth daily.   30 tablet   3   . fluconazole (DIFLUCAN) 150 MG tablet   Oral   Take 1 tablet (150 mg total) by mouth once a week.   12 tablet   0   . losartan (COZAAR) 50 MG tablet   Oral   Take 1 tablet (50 mg total) by mouth daily.   90 tablet   3   . metoprolol (TOPROL-XL) 200 MG 24 hr tablet   Oral   Take 0.5 tablets (100 mg total) by mouth daily.   30 tablet   6   . omeprazole (PRILOSEC) 20 MG capsule   Oral   Take 20 mg by mouth daily.  Will sometimes take twice daily if heartburn increases.         . predniSONE (DELTASONE) 20 MG tablet   Oral   Take 2 tablets (40 mg total) by mouth daily with breakfast.   22 tablet   0   . terbinafine (LAMISIL) 1 % cream   Topical   Apply topically daily. Apply to affected toes   30 g   0    BP 193/73  Temp(Src) 98.1 F (36.7 C) (Oral)  Resp 18  SpO2 100% Physical Exam  Nursing note and vitals reviewed.  79 year old female, resting comfortably and in no acute distress. Vital signs are significant for hypertension with blood pressure 193/73. Oxygen saturation is 100%, which is normal. Head is normocephalic and atraumatic. PERRLA, EOMI. Oropharynx is clear. Left lower lid is everted which is a chronic condition for her. Neck is nontender and supple without adenopathy. JVD is present at 90. Back is nontender and there is no CVA tenderness. Lungs are clear without rales, wheezes, or rhonchi. Chest is nontender. Heart has regular rate and rhythm without murmur. Abdomen is soft, flat,  nontender without masses or hepatosplenomegaly and peristalsis is normoactive. Extremities have no cyanosis or edema, full range of motion is present. Venous stasis changes are present and more marked on the left than on the right. She has a dressing in place over a burn of her left lower leg. Dressing is not removed. Skin is warm and dry without rash. Neurologic: Mental status is normal, cranial nerves are intact, there are no motor or sensory deficits.  ED Course  Procedures (including critical care time) Results for orders placed during the hospital encounter of 12/27/12  CBC WITH DIFFERENTIAL      Result Value Range   WBC 8.8  4.0 - 10.5 K/uL   RBC 3.31 (*) 3.87 - 5.11 MIL/uL   Hemoglobin 10.3 (*) 12.0 - 15.0 g/dL   HCT 16.1 (*) 09.6 - 04.5 %   MCV 94.9  78.0 - 100.0 fL   MCH 31.1  26.0 - 34.0 pg   MCHC 32.8  30.0 - 36.0 g/dL   RDW 40.9  81.1 - 91.4 %   Platelets 248  150 - 400 K/uL   Neutrophils Relative % 76  43 - 77 %   Neutro Abs 6.6  1.7 - 7.7 K/uL   Lymphocytes Relative 19  12 - 46 %   Lymphs Abs 1.6  0.7 - 4.0 K/uL   Monocytes Relative 6  3 - 12 %   Monocytes Absolute 0.5  0.1 - 1.0 K/uL   Eosinophils Relative 0  0 - 5 %   Eosinophils Absolute 0.0  0.0 - 0.7 K/uL   Basophils Relative 0  0 - 1 %   Basophils Absolute 0.0  0.0 - 0.1 K/uL  BASIC METABOLIC PANEL      Result Value Range   Sodium 141  135 - 145 mEq/L   Potassium 4.2  3.5 - 5.1 mEq/L   Chloride 107  96 - 112 mEq/L   CO2 26  19 - 32 mEq/L   Glucose, Bld 83  70 - 99 mg/dL   BUN 27 (*) 6 - 23 mg/dL   Creatinine, Ser 7.82  0.50 - 1.10 mg/dL   Calcium 9.1  8.4 - 95.6 mg/dL   GFR calc non Af Amer 50 (*) >90 mL/min   GFR calc Af Amer 58 (*) >90 mL/min  TROPONIN I      Result  Value Range   Troponin I <0.30  <0.30 ng/mL  PRO B NATRIURETIC PEPTIDE      Result Value Range   Pro B Natriuretic peptide (BNP) 1339.0 (*) 0 - 450 pg/mL  D-DIMER, QUANTITATIVE      Result Value Range   D-Dimer, Quant 3.57 (*) 0.00 -  0.48 ug/mL-FEU  TROPONIN I      Result Value Range   Troponin I <0.30  <0.30 ng/mL  BASIC METABOLIC PANEL      Result Value Range   Sodium 140  135 - 145 mEq/L   Potassium 4.0  3.5 - 5.1 mEq/L   Chloride 103  96 - 112 mEq/L   CO2 27  19 - 32 mEq/L   Glucose, Bld 89  70 - 99 mg/dL   BUN 29 (*) 6 - 23 mg/dL   Creatinine, Ser 4.54 (*) 0.50 - 1.10 mg/dL   Calcium 8.6  8.4 - 09.8 mg/dL   GFR calc non Af Amer 43 (*) >90 mL/min   GFR calc Af Amer 49 (*) >90 mL/min  CBC      Result Value Range   WBC 7.4  4.0 - 10.5 K/uL   RBC 3.45 (*) 3.87 - 5.11 MIL/uL   Hemoglobin 10.5 (*) 12.0 - 15.0 g/dL   HCT 11.9 (*) 14.7 - 82.9 %   MCV 93.9  78.0 - 100.0 fL   MCH 30.4  26.0 - 34.0 pg   MCHC 32.4  30.0 - 36.0 g/dL   RDW 56.2  13.0 - 86.5 %   Platelets 222  150 - 400 K/uL  MAGNESIUM      Result Value Range   Magnesium 1.6  1.5 - 2.5 mg/dL  LIPID PANEL      Result Value Range   Cholesterol 184  0 - 200 mg/dL   Triglycerides 784  <696 mg/dL   HDL 63  >29 mg/dL   Total CHOL/HDL Ratio 2.9     VLDL 27  0 - 40 mg/dL   LDL Cholesterol 94  0 - 99 mg/dL  SEDIMENTATION RATE      Result Value Range   Sed Rate 28 (*) 0 - 22 mm/hr   CT Angio Chest W/cm &/or Wo Cm  12/27/2012   **ADDENDUM** CREATED: 12/27/2012 09:10:56  Stable T3 and T4 compression deformities.  **END ADDENDUM** SIGNED BY: Marlowe Aschoff. Hoss, M.D.  12/27/2012   *RADIOLOGY REPORT*  Clinical Data: Short of breath  CT ANGIOGRAPHY CHEST  Technique:  Multidetector CT imaging of the chest using the standard protocol during bolus administration of intravenous contrast. Multiplanar reconstructed images including MIPs were obtained and reviewed to evaluate the vascular anatomy.  Contrast: 80mL OMNIPAQUE IOHEXOL 350 MG/ML SOLN  Comparison: 01/13/2008  Findings: There are no filling defects in the pulmonary arterial tree to suggest acute pulmonary thromboembolism.  Atherosclerotic changes of the aorta, great vessels, and coronary artery is diffusely is  noted.  Negative abnormal mediastinal adenopathy.  No pericardial effusion.  Low lung volumes.  Scattered atelectasis at the lung bases.  No consolidation or lung mass.  No pneumothorax.  No pleural effusion.  Liver remains nodular in appearance with slight prominence of the left lobe.  These are subtle findings of early possible cirrhosis. Atherosclerotic changes of the aorta in the upper abdomen are present with suspected penetrating atherosclerotic ulcers.  IMPRESSION: No evidence of acute pulmonary thromboembolism.   Original Report Authenticated By: Jolaine Click, M.D.   Dg Chest Portable 1 View  12/27/2012   *  RADIOLOGY REPORT*  Clinical Data: Short of breath  PORTABLE CHEST - 1 VIEW  Comparison: 12/12/2012  Findings: The heart is moderately enlarged.  Vascular congestion without interstitial edema.  No pneumothorax.  No pleural effusion.  IMPRESSION: Cardiomegaly and vascular congestion.   Original Report Authenticated By: Jolaine Click, M.D.    1. Dyspnea   2. Hypertension   3. Acute systolic heart failure   4. Aortic valve disorders   5. Onychomycosis of toenail   6. Unspecified essential hypertension     MDM  Dyspnea of uncertain cause.JVD suggests congestive heart failure and BNP will be checked. Chest x-ray will be obtained. Old records are reviewed and she was recently hospitalizedand diagnosed with temporal arteritis. She has a somewhat sedentary life so she would be at risk for pulmonary embolism. D-dimer will be obtained to screen for same.  D-dimer is elevated, so she will be sent for CT angiogram.  Dione Booze, MD 12/28/12 2304

## 2012-12-27 NOTE — ED Provider Notes (Signed)
Pt received at sign out pending CT-A chest r/o PE. 79 yo F, c/o SOB and HTN that began PTA. 240/120 on arrival which as spontaneously improved to 160/80's.  CT-A chest negative for PE. BNP elevated with pulm vasc congestion on CXR.  No previous BNP to compare. H/H per baseline anemia. Pt ambulated with O2 Sats 95-100% R/A, HR increasing to 105-125 and pt c/o "burning" in her throat and "indigestion."  Symptoms resolved with her returning to stretcher and resting.  HR 90's, Sats 98% R/A. Will admit for further cardiac eval. T/C to [facility] Resident, case discussed, including:  HPI, pertinent PM/SHx, VS/PE, dx testing, ED course and treatment:  Agreeable to admit, requests they will come to ED for eval.     Laray Anger, DO 12/27/12 1126

## 2012-12-27 NOTE — H&P (Signed)
Date: 12/27/2012               Patient Name:  Jane [name] MRN: [id number]  DOB: [birth year] Age / Sex: 79 y.o., female   PCP: Manuela Schwartz, MD         Medical Service: Internal Medicine Teaching Service         Attending Physician: Dr. [name], DO    First Contact: Dr. Sherrine Maples Pager: [phone]  Second Contact: Dr. 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   She states that she woke up this morning and was short of breath. She got up to go to the bathroom, was able to ambulate to the bathroom with only mild shortness of breath. She states her shortness breath persisted so she called EMS to come to the emergency room. Per EMS her blood pressure was 240/120, but that had improved 167/73 in the ED. She was ambulating the hallways and saturating 95 is 97% on room air while in the emergency room. However she was complaining of a burning sensation in her chest similar to her reflux for which he takes omeprazole twice a day. She states she is unsure the last time she took her omeprazole. She denies any chest pain cough or current shortness of breath.  She has medications for her blood pressure at home; however she states she cannot remember which took her medicines last.  Of note she was recently admitted and discharged secondary to a headache which is concerning for temporal arteritis. However the biopsy results were negative for temporal arthritis showed normal temporal artery.   Meds: Current Facility-Administered Medications  Medication Dose Route Frequency Provider Last Rate Last Dose  . 0.9 %  sodium chloride infusion  250 mL Intravenous PRN Genelle Gather, MD      . acetaminophen (TYLENOL) tablet 650 mg  650  mg Oral Q6H PRN Genelle Gather, MD       Or  . acetaminophen (TYLENOL) suppository 650 mg  650 mg Rectal Q6H PRN Genelle Gather, MD      . artificial tears (LACRILUBE) ophthalmic ointment 1 application  1 application Both Eyes QHS,MR X 1 Genelle Gather, MD      . aspirin EC tablet 81 mg  81 mg Oral Daily Genelle Gather, MD      . enoxaparin (LOVENOX) injection 40 mg  40 mg Subcutaneous Q24H Genelle Gather, MD      . ferrous gluconate [facility]) tablet 324 mg  324 mg Oral Daily Genelle Gather, MD      . furosemide (LASIX) injection 40 mg  40 mg Intravenous Once Genelle Gather, MD      . losartan (COZAAR) tablet 50 mg  50 mg Oral Daily Genelle Gather, MD      . metoprolol succinate (TOPROL-XL) 24 hr tablet 100 mg  100 mg Oral Daily Genelle Gather, MD      . pantoprazole (PROTONIX) EC tablet 20 mg  20 mg Oral Daily Genelle Gather, MD      . sodium chloride 0.9 % injection 3 mL  3 mL Intravenous Q12H Genelle Gather, MD      .  sodium chloride 0.9 % injection 3 mL  3 mL Intravenous PRN Genelle Gather, MD      . terbinafine (LAMISIL) 1 % cream   Topical Daily Genelle Gather, MD        Allergies: Allergies as of 12/27/2012  . (No Known Allergies)   Past Medical History  Diagnosis Date  . Anemia   . Anxiety   . Arthritis   . GERD (gastroesophageal reflux disease)   . Hyperlipidemia   . Hypertension   . Vitamin D deficiency   . Colon polyp     2009 colonoscopy  . Hiatal hernia   . Diverticulosis   . Aortic insufficiency     mild  . Stroke 1975   Past Surgical History  Procedure Laterality Date  . Appendectomy    . Abdominal hysterectomy    . Artery biopsy Left 12/16/2012    Procedure: BIOPSY TEMPORAL ARTERY;  Surgeon: Pryor Ochoa, MD;  Location: [facility] OR;  Service: Vascular;  Laterality: Left;   Family History  Problem Relation Age of Onset  . Stroke Mother   . Hypertension Mother   . Stroke Father   . Hypertension Father   . Diabetes Sister    History   Social  History  . Marital Status: Widowed    Spouse Name: N/A    Number of Children: N/A  . Years of Education: N/A   Occupational History  . Not on file.   Social History Main Topics  . Smoking status: Former Smoker    Types: Cigarettes    Quit date: 07/01/1984  . Smokeless tobacco: Not on file  . Alcohol Use: No  . Drug Use: No  . Sexually Active: No   Other Topics Concern  . Not on file   Social History Narrative  . No narrative on file    Review of Systems: A 10 point ROS was performed; pertinent positives and negatives were noted in the HPI   Physical Exam: Blood pressure 156/108, pulse 86, temperature 97.5 F (36.4 C), temperature source Oral, resp. rate 20, height 5\' 4"  (1.626 m), weight 183 lb 1.6 oz (83.054 kg), SpO2 100.00%. General: Alert, well-developed, and cooperative on examination.  Head: Normocephalic and atraumatic.  Eyes: Vision grossly intact, pupils equal, round, and reactive to light, no injection and anicteric.  Mouth: Pharynx pink and moist, no erythema or exudates.  Neck: Supple, full ROM, no thyromegaly, no JVD, and no carotid bruits.  Lungs: CTAB, normal respiratory effort, no accessory muscle use, no crackles, and no wheezes. Heart: Irregular rate and rhythm, no murmur, no gallop, and no rub.  Abdomen: Soft, non-tender, non-distended, normal bowel sounds, no guarding, no rebound tenderness, no organomegaly.  Msk: No joint swelling, warmth, or erythema.  Extremities: 2+ radial and DP pulses bilaterally. 1+ pitting edema to BLE. Foam dressing in place on left shin. No cyanosis, clubbing, edema Neurologic: Alert & oriented X3, cranial nerves II-XII intact, strength normal in all extremities, sensation intact to light touch. Skin: Turgor normal and no rashes.  Psych: Normal mood and affect. Memory intact for recent and remote, normally interactive, good eye contact, not anxious appearing, and not depressed appearing.  Lab results: Basic Metabolic  Panel:  Recent Labs  12/27/12 0639  NA 141  K 4.2  CL 107  CO2 26  GLUCOSE 83  BUN 27*  CREATININE 1.01  CALCIUM 9.1   CBC:  Recent Labs  12/27/12 0639  WBC 8.8  NEUTROABS 6.6  HGB 10.3*  HCT 31.4*  MCV 94.9  PLT 248   Cardiac Enzymes:  Recent Labs  12/27/12 0639  TROPONINI <0.30   BNP:  Recent Labs  12/27/12 0639  PROBNP 1339.0*   D-Dimer:  Recent Labs  12/27/12 0639  DDIMER 3.57*    Imaging results:  Ct Angio Chest W/cm &/or Wo Cm  12/27/2012   **ADDENDUM** CREATED: 12/27/2012 09:10:56  Stable T3 and T4 compression deformities.  **END ADDENDUM** SIGNED BY: Marlowe Aschoff. Hoss, M.D.  12/27/2012   *RADIOLOGY REPORT*  Clinical Data: Short of breath  CT ANGIOGRAPHY CHEST  Technique:  Multidetector CT imaging of the chest using the standard protocol during bolus administration of intravenous contrast. Multiplanar reconstructed images including MIPs were obtained and reviewed to evaluate the vascular anatomy.  Contrast: 80mL OMNIPAQUE IOHEXOL 350 MG/ML SOLN  Comparison: 01/13/2008  Findings: There are no filling defects in the pulmonary arterial tree to suggest acute pulmonary thromboembolism.  Atherosclerotic changes of the aorta, great vessels, and coronary artery is diffusely is noted.  Negative abnormal mediastinal adenopathy.  No pericardial effusion.  Low lung volumes.  Scattered atelectasis at the lung bases.  No consolidation or lung mass.  No pneumothorax.  No pleural effusion.  Liver remains nodular in appearance with slight prominence of the left lobe.  These are subtle findings of early possible cirrhosis. Atherosclerotic changes of the aorta in the upper abdomen are present with suspected penetrating atherosclerotic ulcers.  IMPRESSION: No evidence of acute pulmonary thromboembolism.   Original Report Authenticated By: Jolaine Click, M.D.   Dg Chest Portable 1 View  12/27/2012   *RADIOLOGY REPORT*  Clinical Data: Short of breath  PORTABLE CHEST - 1 VIEW   Comparison: 12/12/2012  Findings: The heart is moderately enlarged.  Vascular congestion without interstitial edema.  No pneumothorax.  No pleural effusion.  IMPRESSION: Cardiomegaly and vascular congestion.   Original Report Authenticated By: Jolaine Click, M.D.    Other results: EKG: Sinus arrythmia with PACs. Normal axis. QTc 425.   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   #: SOB: Sx most likely due to congestive heart failure. Consistent with this diagnosis, patient's pro BNP is elevated at 1339, chest x-ray showed pulmonary congestion. She does have pitting edema in legs bilaterally. Previously 2-D echo on 04/28/08 showed EF 55% without wall motion abnormalities. D-dimer elevated to 3.57, but CTA negative, ruling out pulmonary embolism. It is unlikely that patient has pneumonia, as the patient does not have fever, chills, chest pain or cough. Able to ambulate without desaturating on room air in the ED. Patient does not have a history of asthma or COPD, making an exacerbation unlikely.  - Admit to IMTS to telemetry bed  - Lasix 40 mg IV x1, reassess volume status afterwards  - F/u BMP and Mg for electrolytes  - Aspirin 81 mg daily  - Repeat a 2-D echo  - Repeated EKG morning  - Troponin X 1  - PT evaluation   #: HTN: Severely elevated en route to the ED with a blood pressure of 240/120, per EMS. BP in the ED was 193/73 initially; however, when we saw the pt, her blood pressure was a 167/73 without intervention. Patient denies any chest pain. She is on Cozaar and metoprolol at home, but is unsure when she last took her medications.  - Continue home medication: Cozaar 50 mg daily, metoprolol 100 mg daily,  -  Giving Lasix 40 mg IV x1  - She may need outpatient nursing to assist with daily medications if she is to continue to live at home.  #: GERD: Pt c/o of reflux-like symptoms while  ambulating in the ED. She is on Omeprazole BID at home but is unsure of when she last took the medication. - Protonix 40 mg po daily   #: HLD: LDL was 135 on 04/18/08. Currently not on medications at home.  - Checking Fasting Lipid Panel  #: Everted L lower eyelid: Pt unable to give an accurate history as to when this issue began, however it appears it has been present and unchanged for at least 5-6 months. She has been prescribed erythromycin ophthalmic ointment for possible infection associated with the issue, however there does not appear to be any clear signs of infection at time of admission.  - Continue artificial tears   # Hx stroke: Remote (1975). Will continue ASA at this time.   #DVT PPx: Lovenox, SCDs   Dispo: Disposition is deferred at this time, awaiting improvement of current medical problems. Anticipated discharge in approximately 1-3 day(s).   The patient does have a current PCP [facility] Montey Hora, MD) and does need an [facility] hospital follow-up appointment after discharge.  The patient does not have transportation limitations that hinder transportation to clinic appointments.  Signed: Genelle Gather, MD 12/27/2012, 1:27 PM

## 2012-12-27 NOTE — ED Notes (Addendum)
Pt stated that she takes 2 pills (unsure of the name of the medication) in the morning with breakfast and that she thinks that she took the pills but is unsure because she did not have breakfast this AM  she said that she had SOB this morning and a period of forgetfulness so she is unaware of her last dose of medication.

## 2012-12-27 NOTE — ED Notes (Addendum)
Pt walked the hall way 02 was 95-100% on room air pt heart rate was 105-125 while walking. Pt said she feels like she has acid reflux when she is walking and it burns in her throat. Pt is back in the bed her 02 is 98% on room air and her heart rate is 95

## 2012-12-27 NOTE — ED Notes (Signed)
PT's sons name is Casimiro Needle he may be reached at [phone]

## 2012-12-27 NOTE — ED Notes (Signed)
Patient with shortness of breath that started and has resolved.  Patient is hypertensive 240/120.  Patient does have left eye droop normally.  Patient with no complaints of pain.

## 2012-12-28 ENCOUNTER — Encounter (HOSPITAL_COMMUNITY): Payer: Self-pay | Admitting: Internal Medicine

## 2012-12-28 DIAGNOSIS — R0609 Other forms of dyspnea: Secondary | ICD-10-CM | POA: Diagnosis not present

## 2012-12-28 DIAGNOSIS — I69998 Other sequelae following unspecified cerebrovascular disease: Secondary | ICD-10-CM | POA: Diagnosis not present

## 2012-12-28 DIAGNOSIS — R0989 Other specified symptoms and signs involving the circulatory and respiratory systems: Secondary | ICD-10-CM

## 2012-12-28 DIAGNOSIS — F411 Generalized anxiety disorder: Secondary | ICD-10-CM | POA: Diagnosis not present

## 2012-12-28 DIAGNOSIS — R209 Unspecified disturbances of skin sensation: Secondary | ICD-10-CM | POA: Diagnosis not present

## 2012-12-28 DIAGNOSIS — I5021 Acute systolic (congestive) heart failure: Principal | ICD-10-CM

## 2012-12-28 DIAGNOSIS — I10 Essential (primary) hypertension: Secondary | ICD-10-CM | POA: Diagnosis not present

## 2012-12-28 DIAGNOSIS — L97809 Non-pressure chronic ulcer of other part of unspecified lower leg with unspecified severity: Secondary | ICD-10-CM | POA: Diagnosis not present

## 2012-12-28 DIAGNOSIS — I359 Nonrheumatic aortic valve disorder, unspecified: Secondary | ICD-10-CM

## 2012-12-28 DIAGNOSIS — B351 Tinea unguium: Secondary | ICD-10-CM

## 2012-12-28 DIAGNOSIS — D649 Anemia, unspecified: Secondary | ICD-10-CM | POA: Diagnosis not present

## 2012-12-28 LAB — CBC
Hemoglobin: 10.5 g/dL — ABNORMAL LOW (ref 12.0–15.0)
MCH: 30.4 pg (ref 26.0–34.0)
MCHC: 32.4 g/dL (ref 30.0–36.0)
Platelets: 222 10*3/uL (ref 150–400)
RDW: 14.3 % (ref 11.5–15.5)

## 2012-12-28 LAB — BASIC METABOLIC PANEL
BUN: 29 mg/dL — ABNORMAL HIGH (ref 6–23)
Calcium: 8.6 mg/dL (ref 8.4–10.5)
Creatinine, Ser: 1.16 mg/dL — ABNORMAL HIGH (ref 0.50–1.10)
GFR calc Af Amer: 49 mL/min — ABNORMAL LOW (ref >90)
GFR calc non Af Amer: 43 mL/min — ABNORMAL LOW (ref >90)
Glucose, Bld: 89 mg/dL (ref 70–99)

## 2012-12-28 LAB — MAGNESIUM: Magnesium: 1.6 mg/dL (ref 1.5–2.5)

## 2012-12-28 LAB — SEDIMENTATION RATE: Sed Rate: 28 mm/hr — ABNORMAL HIGH (ref 0–22)

## 2012-12-28 LAB — LIPID PANEL: Total CHOL/HDL Ratio: 2.9 RATIO

## 2012-12-28 MED ORDER — PREDNISONE 20 MG PO TABS
40.0000 mg | ORAL_TABLET | Freq: Every day | ORAL | Status: DC
  Administered 2012-12-28 – 2012-12-29 (×2): 40 mg via ORAL
  Filled 2012-12-28 (×3): qty 2

## 2012-12-28 MED ORDER — FUROSEMIDE 20 MG PO TABS
20.0000 mg | ORAL_TABLET | Freq: Two times a day (BID) | ORAL | Status: DC
  Administered 2012-12-28 – 2012-12-29 (×4): 20 mg via ORAL
  Filled 2012-12-28 (×5): qty 1

## 2012-12-28 NOTE — Progress Notes (Signed)
Pt alert and oriented , NAD noted, able to communicate needs. Pt up to [facility] with one person assist, will continue to monitor.

## 2012-12-28 NOTE — Evaluation (Signed)
Physical Therapy Evaluation Patient Details Name: Jane [name] MRN: [id number] DOB: Jan 24, 1929 Today's Date: 12/28/2012 Time: 7829-5621 PT Time Calculation (min): 22 min  PT Assessment / Plan / Recommendation History of Present Illness  pt admitted for SOB and increased BP  Clinical Impression  Pt functioning near baseline. Pt with minimal community integration and resides in home most of the time. Pt strongly encouraged to use RW 100%of time to improve safety and decrease risk of falling with ambulation. Pt safe to d/c home once medical stable.    PT Assessment  Patient needs continued PT services    Follow Up Recommendations  Home health PT    Does the patient have the potential to tolerate intense rehabilitation      Barriers to Discharge Decreased caregiver support pt with sister who lives down the hall in addition to supportive neighbors    Equipment Recommendations  None recommended by PT (pt has RW)    Recommendations for Other Services     Frequency Min 3X/week    Precautions / Restrictions Precautions Precautions: Fall Restrictions Weight Bearing Restrictions: No   Pertinent Vitals/Pain Pt denies pain      Mobility  Bed Mobility Bed Mobility: Supine to Sit;Sit to Supine;Sitting - Scoot to Edge of Bed Supine to Sit: 5: Supervision;With rails;HOB flat Sitting - Scoot to Edge of Bed: 5: Supervision Sit to Supine: 5: Supervision Details for Bed Mobility Assistance: safe technique Transfers Transfers: Sit to Stand;Stand to Sit Sit to Stand: 5: Supervision;With upper extremity assist;With armrests;From chair/3-in-1 Stand to Sit: 5: Supervision;With upper extremity assist;With armrests;To chair/3-in-1 Details for Transfer Assistance: cues to reinforce safe hand placement Ambulation/Gait Ambulation/Gait Assistance: 4: Min guard Ambulation Distance (Feet): 150 Feet Assistive device: Rolling walker Ambulation/Gait Assistance Details: attempted to amb without  RW and pt con't tried to hold onto counter, railing or wall. Pt reports she can't fit walker in her kitchen but agrees she feels increased stability with  RW Gait Pattern: Step-through pattern;Decreased stride length;Shuffle;Trunk flexed Gait velocity: decreased General Gait Details: pt requires RW for safe amb Stairs: No    Exercises     PT Diagnosis: Abnormality of gait  PT Problem List: Decreased knowledge of use of DME;Decreased safety awareness;Decreased balance;Decreased mobility PT Treatment Interventions: DME instruction;Balance training;Gait training;Functional mobility training;Patient/family education;Therapeutic activities;Therapeutic exercise     PT Goals(Current goals can be found in the care plan section) Acute Rehab PT Goals Patient Stated Goal: To go home tomorrow PT Goal Formulation: With patient Time For Goal Achievement: 01/11/13 Potential to Achieve Goals: Good  Visit Information  Last PT Received On: 12/28/12 Assistance Needed: +1 History of Present Illness: pt admitted for SOB and increased BP       Prior Functioning  Home Living Family/patient expects to be discharged to:: Private residence Living Arrangements: Alone Available Help at Discharge: Family;Neighbor;Friend(s);Available PRN/intermittently Type of Home: Apartment Home Access: Elevator Home Layout: One level Home Equipment: Cane - single point;Walker - 2 wheels;Grab bars - tub/shower;Grab bars - toilet Prior Function Level of Independence: Independent Comments: pt sponge bathes Communication Communication: No difficulties Dominant Hand: Right    Cognition  Cognition Arousal/Alertness: Awake/alert Behavior During Therapy: WFL for tasks assessed/performed Overall Cognitive Status: Within Functional Limits for tasks assessed    Extremity/Trunk Assessment Upper Extremity Assessment Upper Extremity Assessment: Generalized weakness Lower Extremity Assessment Lower Extremity Assessment:  Generalized weakness Cervical / Trunk Assessment Cervical / Trunk Assessment: Kyphotic   Balance Balance Balance Assessed: Yes Static Sitting Balance Static Sitting - Balance  Support: No upper extremity supported Static Sitting - Level of Assistance: 5: Stand by assistance Static Sitting - Comment/# of Minutes: 5 min  End of Session PT - End of Session Equipment Utilized During Treatment: Gait belt Activity Tolerance: Patient tolerated treatment well Patient left: in bed;with call bell/phone within reach (pt returned to bed due to MD returning to cut toe nails) Nurse Communication: Mobility status  GP     Marcene Brawn 12/28/2012, 2:21 PM  Lewis Shock, PT, DPT Pager #: [phone] Office #: [phone]

## 2012-12-28 NOTE — Progress Notes (Signed)
  Echocardiogram 2D Echocardiogram has been performed.  Jane [name] 12/28/2012, 2:28 PM

## 2012-12-28 NOTE — Plan of Care (Signed)
Problem: Consults Goal: Heart Failure Patient Education (See Patient Education module for education specifics.)  Outcome: Progressing CHF video presented to the pt. Pt oriented about daily weight and fluid restriction.

## 2012-12-28 NOTE — Progress Notes (Addendum)
Subjective: No overnight events. Pt is doing well overall. Denies any further SOB. No CP, no DOE.   Objective: Vital signs in last 24 hours: Filed Vitals:   12/27/12 1300 12/27/12 2052 12/28/12 0626 12/28/12 0900  BP: 156/108 167/73 151/69 126/67  Pulse: 86 80 80 59  Temp: 97.5 F (36.4 C) 98.3 F (36.8 C) 98.6 F (37 C) 98 F (36.7 C)  TempSrc:  Oral Oral Oral  Resp: 20 20 20 16   Height: 5\' 4"  (1.626 m)     Weight: 183 lb 1.6 oz (83.054 kg)  178 lb 3.2 oz (80.831 kg)   SpO2: 100% 99% 97% 97%   Weight change:   Intake/Output Summary (Last 24 hours) at 12/28/12 1150 Last data filed at 12/28/12 1147  Gross per 24 hour  Intake    640 ml  Output   1450 ml  Net   -810 ml   Vitals reviewed. General: Comfortably resting in bed, NAD HEENT: PERRL, EOMI, no scleral icterus Cardiac: RRR, no rubs, murmurs or gallops Pulm: clear to auscultation bilaterally, no wheezes, rales, or rhonchi Abd: soft, nontender, nondistended, BS present Ext: warm and well perfused, no pedal edema, foam dressing on her R shin Neuro: alert and oriented X3, nonfocal  Lab Results: Basic Metabolic Panel:  Recent Labs Lab 12/27/12 0639 12/28/12 0709  NA 141 140  K 4.2 4.0  CL 107 103  CO2 26 27  GLUCOSE 83 89  BUN 27* 29*  CREATININE 1.01 1.16*  CALCIUM 9.1 8.6  MG  --  1.6   CBC:  Recent Labs Lab 12/27/12 0639 12/28/12 0709  WBC 8.8 7.4  NEUTROABS 6.6  --   HGB 10.3* 10.5*  HCT 31.4* 32.4*  MCV 94.9 93.9  PLT 248 222   Cardiac Enzymes:  Recent Labs Lab 12/27/12 0639 12/27/12 1410  TROPONINI <0.30 <0.30   BNP:  Recent Labs Lab 12/27/12 0639  PROBNP 1339.0*   D-Dimer:  Recent Labs Lab 12/27/12 0639  DDIMER 3.57*   Fasting Lipid Panel:  Recent Labs Lab 12/28/12 0709  CHOL 184  HDL 63  LDLCALC 94  TRIG 134  CHOLHDL 2.9    Micro Results: No results found for this or any previous visit (from the past 240 hour(s)).  Studies/Results: Ct Angio Chest W/cm  &/or Wo Cm  12/27/2012   **ADDENDUM** CREATED: 12/27/2012 09:10:56  Stable T3 and T4 compression deformities.  **END ADDENDUM** SIGNED BY: Marlowe Aschoff. Hoss, M.D.  12/27/2012   *RADIOLOGY REPORT*  Clinical Data: Short of breath  CT ANGIOGRAPHY CHEST  Technique:  Multidetector CT imaging of the chest using the standard protocol during bolus administration of intravenous contrast. Multiplanar reconstructed images including MIPs were obtained and reviewed to evaluate the vascular anatomy.  Contrast: 80mL OMNIPAQUE IOHEXOL 350 MG/ML SOLN  Comparison: 01/13/2008  Findings: There are no filling defects in the pulmonary arterial tree to suggest acute pulmonary thromboembolism.  Atherosclerotic changes of the aorta, great vessels, and coronary artery is diffusely is noted.  Negative abnormal mediastinal adenopathy.  No pericardial effusion.  Low lung volumes.  Scattered atelectasis at the lung bases.  No consolidation or lung mass.  No pneumothorax.  No pleural effusion.  Liver remains nodular in appearance with slight prominence of the left lobe.  These are subtle findings of early possible cirrhosis. Atherosclerotic changes of the aorta in the upper abdomen are present with suspected penetrating atherosclerotic ulcers.  IMPRESSION: No evidence of acute pulmonary thromboembolism.   Original Report Authenticated By:  Jolaine Click, M.D.   Dg Chest Portable 1 View  12/27/2012   *RADIOLOGY REPORT*  Clinical Data: Short of breath  PORTABLE CHEST - 1 VIEW  Comparison: 12/12/2012  Findings: The heart is moderately enlarged.  Vascular congestion without interstitial edema.  No pneumothorax.  No pleural effusion.  IMPRESSION: Cardiomegaly and vascular congestion.   Original Report Authenticated By: Jolaine Click, M.D.   Medications: I have reviewed the patient's current medications. Scheduled Meds: . artificial tears  1 application Both Eyes QHS,MR X 1  . aspirin EC  81 mg Oral Daily  . enoxaparin (LOVENOX) injection  40 mg  Subcutaneous Q24H  . ferrous gluconate  324 mg Oral Daily  . furosemide  20 mg Oral BID  . losartan  50 mg Oral Daily  . metoprolol  100 mg Oral Daily  . pantoprazole  20 mg Oral Daily  . predniSONE  40 mg Oral Q breakfast  . sodium chloride  3 mL Intravenous Q12H  . terbinafine   Topical Daily   Continuous Infusions:  PRN Meds:.sodium chloride, acetaminophen, acetaminophen, sodium chloride  Assessment/Plan: 79yo F with PMH CVA '70s, HTN, and a headache, with negative temporal artery biopsy presents to the ED with acute onset SOB.   #: SOB: Sx most likely due to congestive heart failure. Consistent with this diagnosis, patient's pro BNP is elevated at 1339, chest x-ray showed pulmonary congestion. She does have pitting edema in legs bilaterally. Previously 2-D echo on 04/28/08 showed EF 55% without wall motion abnormalities. D-dimer elevated to 3.57, but CTA negative, ruling out pulmonary embolism. It is unlikely that patient has pneumonia, as the patient does not have fever, chills, chest pain or cough. Able to ambulate without desaturating on room air in the ED. Patient does not have a history of asthma or COPD, making an exacerbation unlikely. Troponin negative x1. She is on room air this morning and is saturating well. No peripheral edema and her lungs sound clear to ascultation. She was given 1 dose of Lasix yesterday, and was down >1/2 L today. Will continue her on po Lasix today and check a 2D ECHO today. - Lasix 20 mg po BID  - F/u BMP and Mg for electrolytes  - Aspirin 81 mg daily  - 2-D ECHO today - PT evaluation   #: HTN: Severely elevated en route to the ED with a blood pressure of 240/120, per EMS. BP in the ED was 193/73 initially; however, when we saw the pt, her blood pressure was a 167/73 without intervention. Patient denies any chest pain. She is on Cozaar and metoprolol at home, but is unsure when she last took her medications. Restarted her home meds on admission and she  did get the 1 dose of IV Lasix on admission. BP improved today.  - Continue home medication: Cozaar 50 mg daily, metoprolol 100 mg daily,  - Lasix 20 mg po BID - She will outpatient nursing to assist with daily medications if she is to continue to live at home.   #: GERD: Pt c/o of reflux-like symptoms while ambulating in the ED. She is on Omeprazole BID at home but is unsure of when she last took the medication.  - Protonix 40 mg po daily   #: HLD: LDL was 135 on 04/18/08. Currently not on medications at home. Fasting Lipid Panel normal.  #: Everted L lower eyelid: Pt unable to give an accurate history as to when this issue began, however it appears it has been present  and unchanged for at least 5-6 months. She has been prescribed erythromycin ophthalmic ointment for possible infection associated with the issue, however there does not appear to be any clear signs of infection at time of admission.  - Continue artificial tears   # Hx stroke: Remote (1975). Will continue ASA at this time.   #DVT PPx: Lovenox, SCDs   Dispo: Disposition is deferred at this time, awaiting improvement of current medical problems.  Anticipated discharge in approximately 1-2 day(s).   The patient does have a current PCP Ula Lingo Montey Hora, MD) and does need an [facility] hospital follow-up appointment after discharge.  The patient does have transportation limitations that hinder transportation to clinic appointments.  .Services Needed at time of discharge: Y = Yes, Blank = No PT:   OT:   RN:   Equipment:   Other:     LOS: 1 day   Genelle Gather, MD 12/28/2012, 11:50 AM

## 2012-12-28 NOTE — H&P (Signed)
INTERNAL MEDICINE TEACHING SERVICE Attending Admission Note  Date: 12/28/2012  Patient name: Jane [name]  Medical record number: [id number]  Date of birth: August 06, 1928    I have seen and evaluated [name] and discussed their care with the Residency Team.  84 yr. 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 She was noted to have a BP of 240/120 by EMS. She complained of some burning CP. She could not recall when she last took her medications.  She had evidence of volume overload on admission. She has ruled out for ACS. Not acute ST changes. She does not have Afib. She feels better this morning, less SOB and no CP. Repeat TTE. Resume Lasix 20 mg po bid. She needs podiatry to cut he nails, these are terribly overgrown and causing cutaneous injury. She has warm extremities but faint palpable distal pulses. Doppler pulses today.   [name], DO 6/30/20141:51 PM

## 2012-12-28 NOTE — Progress Notes (Signed)
Utilization Review Completed.   Joclyn Alsobrook, RN, BSN Nurse Case Manager  [phone]  

## 2012-12-29 ENCOUNTER — Telehealth: Payer: Self-pay | Admitting: *Deleted

## 2012-12-29 DIAGNOSIS — I10 Essential (primary) hypertension: Secondary | ICD-10-CM | POA: Diagnosis not present

## 2012-12-29 DIAGNOSIS — I359 Nonrheumatic aortic valve disorder, unspecified: Secondary | ICD-10-CM | POA: Diagnosis not present

## 2012-12-29 DIAGNOSIS — B351 Tinea unguium: Secondary | ICD-10-CM | POA: Diagnosis not present

## 2012-12-29 DIAGNOSIS — I509 Heart failure, unspecified: Secondary | ICD-10-CM

## 2012-12-29 LAB — BASIC METABOLIC PANEL
Chloride: 100 mEq/L (ref 96–112)
Creatinine, Ser: 1.37 mg/dL — ABNORMAL HIGH (ref 0.50–1.10)
GFR calc Af Amer: 40 mL/min — ABNORMAL LOW (ref >90)
Sodium: 138 mEq/L (ref 135–145)

## 2012-12-29 MED ORDER — PREDNISONE 5 MG PO TABS
35.0000 mg | ORAL_TABLET | Freq: Every day | ORAL | Status: DC
  Filled 2012-12-29: qty 1

## 2012-12-29 MED ORDER — PREDNISONE 5 MG PO TABS: 35.0000 mg | ORAL_TABLET | Freq: Every day | ORAL | Status: DC

## 2012-12-29 MED ORDER — FUROSEMIDE 20 MG PO TABS: 20.0000 mg | ORAL_TABLET | Freq: Two times a day (BID) | ORAL | Status: DC

## 2012-12-29 MED ORDER — PREDNISONE 20 MG PO TABS: 30.0000 mg | ORAL_TABLET | Freq: Every day | ORAL | Status: DC

## 2012-12-29 NOTE — Progress Notes (Signed)
Subjective:  No complaints today.   Objective: Vital signs in last 24 hours: Temp:  [97.6 F (36.4 C)-98.4 F (36.9 C)] 98.4 F (36.9 C) (07/01 1434) Pulse Rate:  [54-86] 54 (07/01 1434) Resp:  [16-19] 18 (07/01 1434) BP: (109-137)/(46-63) 116/46 mmHg (07/01 1434) SpO2:  [96 %-98 %] 97 % (07/01 1434) Weight:  [80.922 kg (178 lb 6.4 oz)] 80.922 kg (178 lb 6.4 oz) (07/01 0614) Weight change: -2.132 kg (-4 lb 11.2 oz) Last BM Date: 12/27/12  Intake/Output from previous day: 06/30 0701 - 07/01 0700 In: 806 [P.O.:800; I.V.:6] Out: 1500 [Urine:1500] Intake/Output this shift: Total I/O In: 485 [P.O.:485] Out: 900 [Urine:900]  General appearance: alert and cooperative Resp: clear to auscultation bilaterally Cardio: regular rate and rhythm, S1, S2 normal, no murmur, click, rub or gallop Neurologic: Grossly normal Abd- soft, moves with respiration Extremities- Long growing toe nails.  Lab Results:  Recent Labs  12/27/12 0639 12/28/12 0709  WBC 8.8 7.4  HGB 10.3* 10.5*  HCT 31.4* 32.4*  PLT 248 222   BMET  Recent Labs  12/28/12 0709 12/29/12 0853  NA 140 138  K 4.0 4.0  CL 103 100  CO2 27 27  GLUCOSE 89 119*  BUN 29* 40*  CREATININE 1.16* 1.37*  CALCIUM 8.6 8.3*   Results for Jane, [name] (MRN [id number]) as of 12/29/2012 16:54  Ref. Range 12/28/2012 07:09  Cholesterol Latest Range: 0-200 mg/dL 409  Triglycerides Latest Range: <150 mg/dL 811  HDL Latest Range: >39 mg/dL 63  LDL (calc) Latest Range: 0-99 mg/dL 94  VLDL Latest Range: 0-40 mg/dL 27  Total CHOL/HDL Ratio No range found 2.9     Studies/Results: No results found.  Medications:   Assessment/Plan:  Hypertension- On cozaar and Metoprolol GERD- Protonix  Hyperlipidemia-  Ectropion of left lower eye lid.  Ankle brachial index. To have  Nails cut. To see podiatrist.  LOS: 2 days   Jane [name] 12/29/2012, 4:42 PM

## 2012-12-29 NOTE — Progress Notes (Signed)
Patient was wanting to get toe nails cut and ears cleaning out.  MD to do outpatient.  Plan to d/c home today per MD.  Patient stated that her daughter in law is coming at 5:30 for d/c home.  MD made aware that patient is wanting to go today.

## 2012-12-29 NOTE — Progress Notes (Signed)
Jane [name], PTA [phone] 12/29/2012  

## 2012-12-29 NOTE — Progress Notes (Addendum)
  Date: 12/29/2012  Patient name: Jane [name]  Medical record number: [id number]  Date of birth: 1928/10/22   This patient has been seen and the plan of care was discussed with the house staff. Please see their note for complete details. I concur with their findings with the following additions/corrections:  Feels well today. Denies CP or SOB. She has HFPEF 60-65%. Appears euvolemic, agree with maintenance Lasix 20 mg twice daily at home. Will need slow prednisone taper for presumed TA. Repeat BMP as outpatient in next 1-2 weeks to monitor renal function as well. Needs toenails trimmed, they are very overgrown and has significant onychomycosis.  Needs further outpatient evaluation for PVD, she has distal LE pulses by doppler but faint.   [name], DO 12/29/2012, 2:37 PM

## 2012-12-29 NOTE — Telephone Encounter (Signed)
Needs an order for a transfer bench - Aundra Millet states OT saw pt at home 12/28/12. Stanton Kidney Chelcie Estorga RN 12/29/12 2:15PM

## 2012-12-29 NOTE — Progress Notes (Signed)
Patient's iv was d/c x 2. Tele d/c.  Patient awaiting daughter in law to arrive for d/c home and to go over paperwork with her.  Home health already set-up.

## 2012-12-29 NOTE — Care Management Note (Signed)
    Page 1 of 1   12/29/2012     2:35:48 PM   CARE MANAGEMENT NOTE 12/29/2012  Patient:  Jane [name], Jane [name]   Account Number:  [id number]  Date Initiated:  12/29/2012  Documentation initiated by:  Tera Mater  Subjective/Objective Assessment:   79yo female admitted with CHF.  Pt. lives alone in [place], however her sister lives in same apartment complex     Action/Plan:   discharge planning   Anticipated DC Date:  12/29/2012   Anticipated DC Plan:  HOME W HOME HEALTH SERVICES      DC Planning Services  CM consult      [facility] Choice  Resumption Of Svcs/PTA Provider   Choice offered to / List presented to:             [facility] agency  [facility]   Status of service:  In process, will continue to follow Medicare Important Message given?   (If response is "NO", the following Medicare IM given date fields will be blank) Date Medicare IM given:   Date Additional Medicare IM given:    Discharge Disposition:    Per UR Regulation:  Reviewed for med. necessity/level of care/duration of stay  If discussed at Long Length of Stay Meetings, dates discussed:    Comments:  12/29/12 1415 In to speak with pt. about home health services.  Pt. states she is currently being seen by [facility].  TC to Hilda Lias, with [facility], to give resumption of care orders for [facility] PT/OT, RN, and CSW.  Pt. may dc home today. Tera Mater, RN, BSN NCM [phone]

## 2012-12-29 NOTE — Discharge Summary (Signed)
Name: KATTIE SANTOYO MRN: [id number] DOB: [birth year] 79 y.o. PCP: Manuela Schwartz, MD  Date of Admission: 12/27/2012  6:19 AM Date of Discharge: 12/29/2012 Attending Physician: [name], DO  Discharge Diagnosis: Principal Problem:   Heart failure with preserved ejection fraction Active Problems:   HYPERLIPIDEMIA   HYPERTENSION   Ectropion of left lower eyelid  Discharge Medications:   Medication List    STOP taking these medications       cephALEXin 500 MG capsule  Commonly known as:  KEFLEX     fluconazole 50 MG tablet  Commonly known as:  DIFLUCAN      TAKE these medications       artificial tears Oint ophthalmic ointment  Apply 1 application to eye at bedtime and may repeat dose one time if needed. A thin layer of ointment should be applied to the exposed eyelid, taking care not to touch tip of applicator to eyelid; if complaints of ocular dryness, notify MD     aspirin EC 81 MG tablet  Take 81 mg by mouth daily.     Ferrous Gluconate 256 (28 FE) MG Tabs  Commonly known as:  FERATE  Take 256 mg by mouth daily.     furosemide 20 MG tablet  Commonly known as:  LASIX  Take 1 tablet (20 mg total) by mouth 2 (two) times daily.     losartan 50 MG tablet  Commonly known as:  COZAAR  Take 1 tablet (50 mg total) by mouth daily.     metoprolol 200 MG 24 hr tablet  Commonly known as:  TOPROL-XL  Take 0.5 tablets (100 mg total) by mouth daily.     omeprazole 20 MG capsule  Commonly known as:  PRILOSEC  Take 20 mg by mouth daily. Will sometimes take twice daily if heartburn increases.     predniSONE 5 MG tablet  Commonly known as:  DELTASONE  Take 7 tablets (35 mg total) by mouth daily with breakfast. Taper by 5mg  per week for 7 weeks  Start taking on:  12/30/2012     PRESCRIPTION MEDICATION  Place 1 application into the left eye daily at 2 PM daily at 2 PM. Eye gel. Apply to lower left lid daily.     terbinafine 1 % cream  Commonly known as:   LAMISIL  Apply topically daily. Apply to affected toes        Disposition and follow-up:   Ms.Anija J Norfolk was discharged from [facility] in Good condition.  At the hospital follow up visit please address:  1. BMET to monitor renal fxn (lasix started this admission) 2. Podiatry follow up (unable to cut nails inpatient) 3. Patient found to have very faint pulses on physical exam (identifiable by doppler) -- consider formal ABI 4. Last hospitalization (see d/c summ from 12/20/12), patient started on long prednisone taper for temporal arteritis.  She was discharged with instructions to decrease by 5mg  each week.  Please confirm that she understands this.   Follow-up Appointments:     Follow-up Information   Follow up with Pleas Koch, MD On 01/05/2013. (9:00am)    Contact information:   [address] [facility] [place] [address] [phone]       Follow up with [facility]. (home health nurse, physical therapy, occupational therapy, and social worker)    Contact information:   [phone]      Discharge Instructions: Discharge Orders   Future Appointments  Provider Department Dept Phone   12/30/2012 11:00 AM Myeong Eyvonne Left [facility] [phone]   01/05/2013 9:00 AM Pleas Koch, MD [facility] [phone]   Future Orders Complete By Expires     Call MD for:  difficulty breathing, headache or visual disturbances  As directed     Call MD for:  extreme fatigue  As directed     Call MD for:  persistant dizziness or light-headedness  As directed     Call MD for:  persistant nausea and vomiting  As directed     Call MD for:  severe uncontrolled pain  As directed     Call MD for:  temperature >100.4  As directed     Diet - low sodium heart healthy  As directed     Discharge instructions  As directed     Comments:      -You were started on a new medication during  this hospitalization called lasix (Furosemide) 20mg  twice daily.   -We also decreased your prednisone to 35mg  daily.  Take this dose for 7 days, then decrease to 30mg  daily for 7 days, then 25mg  daily for 7 days, then 20mg  daily for 7 days, then 15mg  daily for 7 days, then 10mg  daily for 7 days, and finally 5mg  daily for 7 days.  I have sent 5mg  tablets to your pharmacy so you do not have to cut tablets in half. -You will need to follow up with podiatry as soon as possible - we will send this note to your primary care doctor.    Increase activity slowly  As directed        Consultations:  None  Procedures Performed:  X-ray Chest Pa And Lateral   12/12/2012   **ADDENDUM** CREATED: 12/12/2012 20:52:29  On the lateral views, there is no airspace consolidation. Therefore, the increased density at the left lateral lung base on the frontal view represented overlapping of the heart and soft tissues.  **END ADDENDUM** SIGNED BY: Londell Moh. Azucena Kuba, M.D.  12/12/2012   *RADIOLOGY REPORT*  Clinical Data: Shortness of breath.  CHEST - 2 VIEW  Comparison: Earlier today.  Findings: Stable enlargement of the cardiac silhouette and prominence of the pulmonary vasculature and interstitial markings. Increased density at the left lateral lung base.  Diffuse osteopenia.  IMPRESSION:  1.  Increased density at the left lateral lung base, most likely representing atelectasis and superimposed soft tissues. 2.  Stable cardiomegaly, pulmonary vascular congestion and changes of COPD.   Original Report Authenticated By: Beckie Salts, M.D.   Ct Head Wo Contrast  12/12/2012   *RADIOLOGY REPORT*  Clinical Data: Severe headache.  Tachycardia.  Anemia. Hypertension.  Previous stroke.  CT HEAD WITHOUT CONTRAST  Technique:  Contiguous axial images were obtained from the base of the skull through the vertex without contrast.  Comparison: 12/19/2010  Findings: There is no evidence of intracranial hemorrhage, brain edema or other signs of acute  infarction.  There is no evidence of intracranial mass lesion or mass effect.  No abnormal extra-axial fluid collections are identified.  Mild to moderate cerebral atrophy is stable.  Moderate chronic small vessel disease is also stable in appearance.  Ventricles are normal in size.  No skull abnormality identified.  IMPRESSION:  1.  No acute intracranial findings. 2.  Stable cerebral atrophy and chronic small vessel disease.   Original Report Authenticated By: Myles Rosenthal, M.D.   Ct Angio Chest W/cm &/or Wo Cm  12/27/2012   **  ADDENDUM** CREATED: 12/27/2012 09:10:56  Stable T3 and T4 compression deformities.  **END ADDENDUM** SIGNED BY: Marlowe Aschoff. Hoss, M.D.  12/27/2012   *RADIOLOGY REPORT*  Clinical Data: Short of breath  CT ANGIOGRAPHY CHEST  Technique:  Multidetector CT imaging of the chest using the standard protocol during bolus administration of intravenous contrast. Multiplanar reconstructed images including MIPs were obtained and reviewed to evaluate the vascular anatomy.  Contrast: 80mL OMNIPAQUE IOHEXOL 350 MG/ML SOLN  Comparison: 01/13/2008  Findings: There are no filling defects in the pulmonary arterial tree to suggest acute pulmonary thromboembolism.  Atherosclerotic changes of the aorta, great vessels, and coronary artery is diffusely is noted.  Negative abnormal mediastinal adenopathy.  No pericardial effusion.  Low lung volumes.  Scattered atelectasis at the lung bases.  No consolidation or lung mass.  No pneumothorax.  No pleural effusion.  Liver remains nodular in appearance with slight prominence of the left lobe.  These are subtle findings of early possible cirrhosis. Atherosclerotic changes of the aorta in the upper abdomen are present with suspected penetrating atherosclerotic ulcers.  IMPRESSION: No evidence of acute pulmonary thromboembolism.   Original Report Authenticated By: Jolaine Click, M.D.   Dg Chest Portable 1 View  12/27/2012   *RADIOLOGY REPORT*  Clinical Data: Short of breath   PORTABLE CHEST - 1 VIEW  Comparison: 12/12/2012  Findings: The heart is moderately enlarged.  Vascular congestion without interstitial edema.  No pneumothorax.  No pleural effusion.  IMPRESSION: Cardiomegaly and vascular congestion.   Original Report Authenticated By: Jolaine Click, M.D.   Dg Chest Portable 1 View  12/12/2012   *RADIOLOGY REPORT*  Clinical Data: Headache, shortness of breath  PORTABLE CHEST - 1 VIEW  Comparison: 12/05/2010  Findings: Cardiomegaly again noted.  No acute infiltrate or pleural effusion.  No pulmonary edema.  Mild hyperinflation again noted.  IMPRESSION: Cardiomegaly.  Mild hyperinflation.  No active disease.   Original Report Authenticated By: Natasha Mead, M.D.    2D Echo: (12/28/12) Study Conclusions - Left ventricle: The cavity size was normal. There was mild concentric hypertrophy. Systolic function was normal. The estimated ejection fraction was in the range of 60% to 65%. Wall motion was normal; there were no regional wall motion abnormalities. Doppler parameters are consistent with abnormal left ventricular relaxation (grade 1diastolic dysfunction). - Aortic valve: Mild to moderate regurgitation directed eccentrically in the LVOT and along the septum. Impressions: - Compared to the prior study, there has been no significant interval change.   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  She states that she woke up this morning and was short of breath. She got up to go to the bathroom, was able to ambulate to the bathroom with only mild shortness of breath. She states her shortness breath persisted so she called EMS to come to the emergency room. Per EMS her blood pressure was 240/120, but that had improved 167/73 in the ED. She was ambulating the hallways and saturating 95 is 97% on room air while in the emergency room. However she was complaining of a burning sensation in her  chest similar to her reflux for which he takes omeprazole twice a day. She states she is unsure the last time she took her omeprazole. She denies any chest pain cough or current shortness of breath.  She has medications for her blood pressure at home; however she states she cannot remember which took her  medicines last.  Of note she was recently admitted and discharged secondary to a headache which is concerning for temporal arteritis. However the biopsy results were negative for temporal arthritis showed normal temporal artery.  Physical Exam:  Blood pressure 156/108, pulse 86, temperature 97.5 F (36.4 C), temperature source Oral, resp. rate 20, height 5\' 4"  (1.626 m), weight 183 lb 1.6 oz (83.054 kg), SpO2 100.00%.  General: Alert, well-developed, and cooperative on examination.  Head: Normocephalic and atraumatic.  Eyes: Vision grossly intact, pupils equal, round, and reactive to light, no injection and anicteric.  Mouth: Pharynx pink and moist, no erythema or exudates.  Neck: Supple, full ROM, no thyromegaly, no JVD, and no carotid bruits.  Lungs: CTAB, normal respiratory effort, no accessory muscle use, no crackles, and no wheezes. Heart: Irregular rate and rhythm, no murmur, no gallop, and no rub.  Abdomen: Soft, non-tender, non-distended, normal bowel sounds, no guarding, no rebound tenderness, no organomegaly.  Msk: No joint swelling, warmth, or erythema.  Extremities: 2+ radial and DP pulses bilaterally. 1+ pitting edema to BLE. Foam dressing in place on left shin. No cyanosis, clubbing, edema Neurologic: Alert & oriented X3, cranial nerves II-XII intact, strength normal in all extremities, sensation intact to light touch. Skin: Turgor normal and no rashes.  Psych: Normal mood and affect. Memory intact for recent and remote, normally interactive, good eye contact, not anxious appearing, and not depressed appearing.   Lab results:  Basic Metabolic Panel:   Recent Labs    12/27/12 0639   NA  141   K  4.2   CL  107   CO2  26   GLUCOSE  83   BUN  27*   CREATININE  1.01   CALCIUM  9.1    CBC:   Recent Labs   12/27/12 0639   WBC  8.8   NEUTROABS  6.6   HGB  10.3*   HCT  31.4*   MCV  94.9   PLT  248    Cardiac Enzymes:   Recent Labs   12/27/12 0639   TROPONINI  <0.30    BNP:   Recent Labs   12/27/12 0639   PROBNP  1339.0*    D-Dimer:   Recent Labs   12/27/12 4098   DDIMER  3.57*      Hospital Course by problem list: 79yo F with PMH CVA '70s, HTN, and a headache, with negative temporal artery biopsy presents to the ED with acute onset SOB.   #: Heart Failure with preserved EF: Exacerbation in the setting of new prednisone use.  At admission, pro BNP is elevated at 1339, chest x-ray showed pulmonary congestion, with exam revealing pitting edema in legs bilaterally. Repeat echo confirmed grade 1 diastolic dysfunction.  Electrolytes monitored during hospitalization.  She was treated with lasix 40mg  IV once, and then she was started on lasix 20mg  PO BID (new medication).    #: HTN: Severely elevated en route to the ED with a blood pressure of 240/120, per EMS. BP in the ED was 193/73 initially; however, her blood pressure fell to 167/73 without intervention.  Restarted her home meds (Cozaar 50mg  daily & metoprolol 100mg  daily).  Also started on lasix as above.  #Stage 3 CKD: Stable throughout hospitalization, even after initiation of lasix.  Baseline 40-50.  #: GERD: Stable, continued on PPI.  #: HLD: LDL was 135 on 04/18/08. Currently not on medications at home. Fasting Lipid Panel normal.   #: Everted L lower eyelid:  Pt unable to give an accurate history as to when this issue began, however it appears it has been present and unchanged for at least 5-6 months. She has been prescribed erythromycin ophthalmic ointment for possible infection associated with the issue, however there does not appear to be any clear signs of infection at  time of admission.  Artificial tears continued.   # Hx stroke: Remote (1975). Will continue ASA at this time.   #DVT PPx: Lovenox during hospitalization.    Discharge Vitals:   BP 116/46  Pulse 54  Temp(Src) 98.4 F (36.9 C) (Oral)  Resp 18  Ht 5\' 4"  (1.626 m)  Wt 178 lb 6.4 oz (80.922 kg)  BMI 30.61 kg/m2  SpO2 97%  Discharge Labs:  Results for orders placed during the hospital encounter of 12/27/12 (from the past 24 hour(s))  BASIC METABOLIC PANEL     Status: Abnormal   Collection Time    12/29/12  8:53 AM      Result Value Range   Sodium 138  135 - 145 mEq/L   Potassium 4.0  3.5 - 5.1 mEq/L   Chloride 100  96 - 112 mEq/L   CO2 27  19 - 32 mEq/L   Glucose, Bld 119 (*) 70 - 99 mg/dL   BUN 40 (*) 6 - 23 mg/dL   Creatinine, Ser 1.61 (*) 0.50 - 1.10 mg/dL   Calcium 8.3 (*) 8.4 - 10.5 mg/dL   GFR calc non Af Amer 35 (*) >90 mL/min   GFR calc Af Amer 40 (*) >90 mL/min    Signed: Belia Heman, MD 12/29/2012, 4:31 PM   Time Spent on Discharge: 35 minutes

## 2012-12-29 NOTE — Progress Notes (Signed)
[facility]  Patient Status: Active (receiving services up to time of hospitalization)  [facility] is providing the following services: RN, PT, OT and MSW  If patient discharges after hours, please call [phone].   Jane [name] 12/29/2012, 4:42 PM

## 2012-12-29 NOTE — Progress Notes (Signed)
Physical Therapy Treatment Patient Details Name: Jane [name] MRN: [id number] DOB: January 04, 1929 Today's Date: 12/29/2012 Time: 4098-1191 PT Time Calculation (min): 27 min  PT Assessment / Plan / Recommendation  PT Comments   Pt was not able to ambulate as far this treatment session. Pt reported that she was limited by pain in her RLE from the removal of a bandage. Patient required min VC for safe transfers and ambulation with a RW. She will benefit from HHPT to increase strength, exercise tolerance, and functional independence.   Follow Up Recommendations  Home health PT     Does the patient have the potential to tolerate intense rehabilitation     Barriers to Discharge        Equipment Recommendations  None recommended by PT    Recommendations for Other Services    Frequency Min 3X/week   Progress towards PT Goals Progress towards PT goals: Progressing toward goals  Plan      Precautions / Restrictions Precautions Precautions: Fall Restrictions Weight Bearing Restrictions: No   Pertinent Vitals/Pain Patient reported pain in her RLE 3/10.    Mobility  Bed Mobility Bed Mobility: Not assessed Transfers Transfers: Sit to Stand;Stand to Sit Sit to Stand: 5: Supervision;With upper extremity assist;With armrests;From chair/3-in-1 Stand to Sit: 5: Supervision;With upper extremity assist;With armrests;To chair/3-in-1 Details for Transfer Assistance: cues for hand placement and to incorporate more UE to make transfer easier and safer. Ambulation/Gait Ambulation/Gait Assistance: 4: Min guard Ambulation Distance (Feet): 90 Feet Assistive device: Rolling walker Ambulation/Gait Assistance Details: Pt unable to ambulate as far today. Limited by fatigue and c/o pain in her RLE due to "removal of bandage over wound."Cueing was needed to keep head up and look where pt was going. Gait Pattern: Step-through pattern;Decreased stride length;Shuffle;Trunk flexed Gait velocity:  decreased General Gait Details: pt requires RW for safe amb Stairs: No Wheelchair Mobility Wheelchair Mobility: No    Exercises Other Exercises Other Exercises: Sit to stand X 5      PT Goals (current goals can now be found in the care plan section) Acute Rehab PT Goals Time For Goal Achievement: 01/11/13 Potential to Achieve Goals: Good  Visit Information  Last PT Received On: 12/29/12 Assistance Needed: +1 History of Present Illness: pt admitted for SOB and increased BP    Subjective Data      Cognition  Cognition Arousal/Alertness: Awake/alert Behavior During Therapy: WFL for tasks assessed/performed Overall Cognitive Status: Within Functional Limits for tasks assessed    Balance     End of Session PT - End of Session Equipment Utilized During Treatment: Gait belt Activity Tolerance: Patient limited by fatigue;Patient limited by pain Patient left: in chair;with call bell/phone within reach Nurse Communication: Mobility status   GP     Jolyn Nap, SPTA 12/29/2012, 10:16 AM

## 2012-12-30 ENCOUNTER — Ambulatory Visit: Payer: Medicare Other | Admitting: Internal Medicine

## 2012-12-30 ENCOUNTER — Ambulatory Visit: Payer: Self-pay | Admitting: Podiatry

## 2012-12-30 DIAGNOSIS — I10 Essential (primary) hypertension: Secondary | ICD-10-CM | POA: Diagnosis not present

## 2012-12-30 DIAGNOSIS — L97809 Non-pressure chronic ulcer of other part of unspecified lower leg with unspecified severity: Secondary | ICD-10-CM | POA: Diagnosis not present

## 2012-12-30 DIAGNOSIS — F411 Generalized anxiety disorder: Secondary | ICD-10-CM | POA: Diagnosis not present

## 2012-12-30 DIAGNOSIS — D649 Anemia, unspecified: Secondary | ICD-10-CM | POA: Diagnosis not present

## 2012-12-30 DIAGNOSIS — R209 Unspecified disturbances of skin sensation: Secondary | ICD-10-CM | POA: Diagnosis not present

## 2012-12-31 DIAGNOSIS — D649 Anemia, unspecified: Secondary | ICD-10-CM | POA: Diagnosis not present

## 2012-12-31 DIAGNOSIS — L97809 Non-pressure chronic ulcer of other part of unspecified lower leg with unspecified severity: Secondary | ICD-10-CM | POA: Diagnosis not present

## 2012-12-31 DIAGNOSIS — I69998 Other sequelae following unspecified cerebrovascular disease: Secondary | ICD-10-CM | POA: Diagnosis not present

## 2012-12-31 DIAGNOSIS — R209 Unspecified disturbances of skin sensation: Secondary | ICD-10-CM | POA: Diagnosis not present

## 2012-12-31 DIAGNOSIS — I10 Essential (primary) hypertension: Secondary | ICD-10-CM | POA: Diagnosis not present

## 2012-12-31 DIAGNOSIS — F411 Generalized anxiety disorder: Secondary | ICD-10-CM | POA: Diagnosis not present

## 2012-12-31 NOTE — Discharge Summary (Signed)
  Date: 12/31/2012  Patient name: Jane [name]  Medical record number: [id number]  Date of birth: [birth year]   This patient has been seen and the plan of care was discussed with the house staff. Please see their note for complete details. I concur with their findings and plan.  [name], DO 12/31/2012, 12:27 PM

## 2013-01-01 ENCOUNTER — Emergency Department (HOSPITAL_COMMUNITY)
Admission: EM | Admit: 2013-01-01 | Discharge: 2013-01-01 | Disposition: A | Discharge status: Home / Self Care | Payer: Medicare Other | Attending: Emergency Medicine | Admitting: Emergency Medicine

## 2013-01-01 ENCOUNTER — Emergency Department (HOSPITAL_COMMUNITY): Payer: Medicare Other

## 2013-01-01 ENCOUNTER — Encounter (HOSPITAL_COMMUNITY): Payer: Self-pay

## 2013-01-01 DIAGNOSIS — Z8673 Personal history of transient ischemic attack (TIA), and cerebral infarction without residual deficits: Secondary | ICD-10-CM | POA: Diagnosis not present

## 2013-01-01 DIAGNOSIS — M129 Arthropathy, unspecified: Secondary | ICD-10-CM | POA: Insufficient documentation

## 2013-01-01 DIAGNOSIS — Z8679 Personal history of other diseases of the circulatory system: Secondary | ICD-10-CM | POA: Diagnosis not present

## 2013-01-01 DIAGNOSIS — IMO0002 Reserved for concepts with insufficient information to code with codable children: Secondary | ICD-10-CM | POA: Diagnosis not present

## 2013-01-01 DIAGNOSIS — R0989 Other specified symptoms and signs involving the circulatory and respiratory systems: Secondary | ICD-10-CM | POA: Insufficient documentation

## 2013-01-01 DIAGNOSIS — R0789 Other chest pain: Secondary | ICD-10-CM | POA: Insufficient documentation

## 2013-01-01 DIAGNOSIS — R0602 Shortness of breath: Secondary | ICD-10-CM | POA: Diagnosis not present

## 2013-01-01 DIAGNOSIS — F411 Generalized anxiety disorder: Secondary | ICD-10-CM | POA: Diagnosis not present

## 2013-01-01 DIAGNOSIS — Z79899 Other long term (current) drug therapy: Secondary | ICD-10-CM | POA: Diagnosis not present

## 2013-01-01 DIAGNOSIS — G459 Transient cerebral ischemic attack, unspecified: Secondary | ICD-10-CM | POA: Diagnosis not present

## 2013-01-01 DIAGNOSIS — M79609 Pain in unspecified limb: Secondary | ICD-10-CM | POA: Insufficient documentation

## 2013-01-01 DIAGNOSIS — Z8639 Personal history of other endocrine, nutritional and metabolic disease: Secondary | ICD-10-CM | POA: Insufficient documentation

## 2013-01-01 DIAGNOSIS — Z7982 Long term (current) use of aspirin: Secondary | ICD-10-CM | POA: Insufficient documentation

## 2013-01-01 DIAGNOSIS — Z8719 Personal history of other diseases of the digestive system: Secondary | ICD-10-CM | POA: Insufficient documentation

## 2013-01-01 DIAGNOSIS — Z862 Personal history of diseases of the blood and blood-forming organs and certain disorders involving the immune mechanism: Secondary | ICD-10-CM | POA: Insufficient documentation

## 2013-01-01 DIAGNOSIS — I129 Hypertensive chronic kidney disease with stage 1 through stage 4 chronic kidney disease, or unspecified chronic kidney disease: Secondary | ICD-10-CM | POA: Diagnosis not present

## 2013-01-01 DIAGNOSIS — N183 Chronic kidney disease, stage 3 unspecified: Secondary | ICD-10-CM | POA: Diagnosis not present

## 2013-01-01 DIAGNOSIS — R0600 Dyspnea, unspecified: Secondary | ICD-10-CM

## 2013-01-01 DIAGNOSIS — R079 Chest pain, unspecified: Secondary | ICD-10-CM | POA: Diagnosis not present

## 2013-01-01 DIAGNOSIS — Z87891 Personal history of nicotine dependence: Secondary | ICD-10-CM | POA: Insufficient documentation

## 2013-01-01 DIAGNOSIS — R0609 Other forms of dyspnea: Secondary | ICD-10-CM | POA: Diagnosis not present

## 2013-01-01 DIAGNOSIS — K219 Gastro-esophageal reflux disease without esophagitis: Secondary | ICD-10-CM | POA: Diagnosis not present

## 2013-01-01 DIAGNOSIS — D649 Anemia, unspecified: Secondary | ICD-10-CM | POA: Diagnosis not present

## 2013-01-01 DIAGNOSIS — Z860100 Personal history of colon polyps, unspecified: Secondary | ICD-10-CM | POA: Insufficient documentation

## 2013-01-01 LAB — BASIC METABOLIC PANEL
CO2: 23 mEq/L (ref 19–32)
Chloride: 102 mEq/L (ref 96–112)
Sodium: 141 mEq/L (ref 135–145)

## 2013-01-01 LAB — CBC WITH DIFFERENTIAL/PLATELET
Basophils Absolute: 0 10*3/uL (ref 0.0–0.1)
HCT: 29.2 % — ABNORMAL LOW (ref 36.0–46.0)
Lymphocytes Relative: 16 % (ref 12–46)
Neutro Abs: 6 10*3/uL (ref 1.7–7.7)
Platelets: 140 10*3/uL — ABNORMAL LOW (ref 150–400)
RDW: 14.4 % (ref 11.5–15.5)
WBC: 7.9 10*3/uL (ref 4.0–10.5)

## 2013-01-01 LAB — PRO B NATRIURETIC PEPTIDE: Pro B Natriuretic peptide (BNP): 457.6 pg/mL — ABNORMAL HIGH (ref 0–450)

## 2013-01-01 NOTE — ED Notes (Signed)
Placed call for [facility] for transportation

## 2013-01-01 NOTE — ED Notes (Signed)
Pt attempting to find a ride home but unable to get a hold of any family members at this time.

## 2013-01-01 NOTE — ED Notes (Signed)
Pt called EMS for SOB. Denies N/V or diaphoresis. EMS transported wwith vitals BP 190/80, SPO2 of 99 on RA and  negative work of breathing

## 2013-01-01 NOTE — ED Provider Notes (Signed)
History    CSN: [id number] Arrival date & time 01/01/13  0410  First MD Initiated Contact with Patient 01/01/13 [phone]     Chief Complaint  Patient presents with  . Shortness of Breath   (Consider location/radiation/quality/duration/timing/severity/associated sxs/prior Treatment) HPI 79 yo female presents to the ER from home via EMS with complaint of shortness of breath.  Pt reports she was getting up to use the bathroom and while walking to BR she became short of breath.  EMS reports htn in route, 190/80.  She denies chest pain, cough, fever.  She denies missing any medications.  Pt reports once she rested, dyspnea resolved.  No sob currently.  Pt recently admitted 6/29-7/1 for similar sxs.  Workup showed elevated ddimer and bnp, but negative cta chest.  She was d/c on lasix, has pt/ot, home health working with her.  Pt reports she was told any time she had sob she should call 911.  Past Medical History  Diagnosis Date  . Anemia   . Anxiety   . Arthritis   . GERD (gastroesophageal reflux disease)   . Hyperlipidemia   . Hypertension   . Vitamin D deficiency   . Colon polyp     2009 colonoscopy  . Hiatal hernia   . Diverticulosis   . Aortic insufficiency     mild  . Stroke 1975  . CKD (chronic kidney disease) stage 3, GFR 30-59 ml/min    Past Surgical History  Procedure Laterality Date  . Appendectomy    . Abdominal hysterectomy    . Artery biopsy Left 12/16/2012    Procedure: BIOPSY TEMPORAL ARTERY;  Surgeon: Pryor Ochoa, MD;  Location: [facility] OR;  Service: Vascular;  Laterality: Left;   Family History  Problem Relation Age of Onset  . Stroke Mother   . Hypertension Mother   . Stroke Father   . Hypertension Father   . Diabetes Sister    History  Substance Use Topics  . Smoking status: Former Smoker    Types: Cigarettes    Quit date: 07/01/1984  . Smokeless tobacco: Former Neurosurgeon  . Alcohol Use: No   OB History   Grav Para Term Preterm Abortions TAB SAB Ect Mult  Living                 Review of Systems  Respiratory: Positive for chest tightness and shortness of breath.   All other systems reviewed and are negative.    Allergies  Review of patient's allergies indicates no known allergies.  Home Medications   Current Outpatient Rx  Name  Route  Sig  Dispense  Refill  . artificial tears (LACRILUBE) OINT ophthalmic ointment   Ophthalmic   Apply 1 application to eye at bedtime and may repeat dose one time if needed. A thin layer of ointment should be applied to the exposed eyelid, taking care not to touch tip of applicator to eyelid; if complaints of ocular dryness, notify MD   1 Tube   0   . aspirin EC 81 MG tablet   Oral   Take 81 mg by mouth daily.         . Ferrous Gluconate (FERATE) 256 (28 FE) MG TABS   Oral   Take 256 mg by mouth daily.   30 tablet   3   . furosemide (LASIX) 20 MG tablet   Oral   Take 1 tablet (20 mg total) by mouth 2 (two) times daily.   60 tablet  3   . losartan (COZAAR) 50 MG tablet   Oral   Take 1 tablet (50 mg total) by mouth daily.   90 tablet   3   . metoprolol (TOPROL-XL) 200 MG 24 hr tablet   Oral   Take 0.5 tablets (100 mg total) by mouth daily.   30 tablet   6   . omeprazole (PRILOSEC) 20 MG capsule   Oral   Take 20 mg by mouth daily. Will sometimes take twice daily if heartburn increases.         . predniSONE (DELTASONE) 5 MG tablet   Oral   Take 7 tablets (35 mg total) by mouth daily with breakfast. Taper by 5mg  per week for 7 weeks   196 tablet   0   . PRESCRIPTION MEDICATION   Left Eye   Place 1 application into the left eye daily at 2 PM daily at 2 PM. Eye gel. Apply to lower left lid daily.         Marland Kitchen terbinafine (LAMISIL) 1 % cream   Topical   Apply topically daily. Apply to affected toes   30 g   0    BP 155/54  Pulse 82  Temp(Src) 98.5 F (36.9 C) (Oral)  SpO2 100% Physical Exam  Nursing note and vitals reviewed. Constitutional: She is oriented to  person, place, and time. She appears well-developed and well-nourished. No distress.  HENT:  Head: Normocephalic and atraumatic.  Nose: Nose normal.  Mouth/Throat: Oropharynx is clear and moist.  Eyes: Conjunctivae and EOM are normal. Pupils are equal, round, and reactive to light.  Neck: Normal range of motion. Neck supple. No JVD present. No tracheal deviation present. No thyromegaly present.  Cardiovascular: Normal rate, regular rhythm, normal heart sounds and intact distal pulses.  Exam reveals no gallop and no friction rub.   No murmur heard. Pulmonary/Chest: Effort normal and breath sounds normal. No stridor. No respiratory distress. She has no wheezes. She has no rales. She exhibits no tenderness.  Abdominal: Soft. Bowel sounds are normal. She exhibits no distension and no mass. There is no tenderness. There is no rebound and no guarding.  Musculoskeletal: Normal range of motion. She exhibits tenderness. She exhibits no edema (right lower leg).  Wound dressed to right lower leg  Lymphadenopathy:    She has no cervical adenopathy.  Neurological: She is alert and oriented to person, place, and time. She exhibits normal muscle tone. Coordination normal.  Skin: Skin is warm and dry. No rash noted. She is not diaphoretic. No erythema. No pallor.  Psychiatric: She has a normal mood and affect. Her behavior is normal. Judgment and thought content normal.    ED Course  Procedures (including critical care time) Labs Reviewed  CBC WITH DIFFERENTIAL - Abnormal; Notable for the following:    RBC 3.12 (*)    Hemoglobin 9.8 (*)    HCT 29.2 (*)    Platelets 140 (*)    All other components within normal limits  BASIC METABOLIC PANEL - Abnormal; Notable for the following:    BUN 50 (*)    Creatinine, Ser 1.19 (*)    GFR calc non Af Amer 41 (*)    GFR calc Af Amer 48 (*)    All other components within normal limits  PRO B NATRIURETIC PEPTIDE - Abnormal; Notable for the following:    Pro B  Natriuretic peptide (BNP) 457.6 (*)    All other components within normal limits   Dg Chest  2 View  01/01/2013   *RADIOLOGY REPORT*  Clinical Data: Chest pain.  CHEST - 2 VIEW  Comparison: CT 12/27/2012, radiograph 12/27/2012.  Findings: Cardiomegaly.  No airspace disease.  No effusion. Basilar atelectasis.  Overlapping soft tissues are present on the lateral view.  There is no airspace disease.  No pleural effusion.  IMPRESSION: Cardiomegaly without failure.   Original Report Authenticated By: Andreas Newport, M.D.   1. Dyspnea     MDM  79 yo female with DOE.  Will recheck baseline labs, chest xray, ambulate with pulse ox.    Olivia Mackie, MD 01/01/13 [phone]

## 2013-01-01 NOTE — ED Notes (Signed)
  Ambulated 40 feet without increase in HR and SPO2 96-98% on RA

## 2013-01-04 DIAGNOSIS — F411 Generalized anxiety disorder: Secondary | ICD-10-CM | POA: Diagnosis not present

## 2013-01-04 DIAGNOSIS — R209 Unspecified disturbances of skin sensation: Secondary | ICD-10-CM | POA: Diagnosis not present

## 2013-01-04 DIAGNOSIS — I69998 Other sequelae following unspecified cerebrovascular disease: Secondary | ICD-10-CM | POA: Diagnosis not present

## 2013-01-04 DIAGNOSIS — L97809 Non-pressure chronic ulcer of other part of unspecified lower leg with unspecified severity: Secondary | ICD-10-CM | POA: Diagnosis not present

## 2013-01-04 DIAGNOSIS — D649 Anemia, unspecified: Secondary | ICD-10-CM | POA: Diagnosis not present

## 2013-01-04 DIAGNOSIS — I10 Essential (primary) hypertension: Secondary | ICD-10-CM | POA: Diagnosis not present

## 2013-01-05 ENCOUNTER — Ambulatory Visit: Payer: Medicare Other | Admitting: Internal Medicine

## 2013-01-06 DIAGNOSIS — I10 Essential (primary) hypertension: Secondary | ICD-10-CM | POA: Diagnosis not present

## 2013-01-06 DIAGNOSIS — L97809 Non-pressure chronic ulcer of other part of unspecified lower leg with unspecified severity: Secondary | ICD-10-CM | POA: Diagnosis not present

## 2013-01-06 DIAGNOSIS — F411 Generalized anxiety disorder: Secondary | ICD-10-CM | POA: Diagnosis not present

## 2013-01-06 DIAGNOSIS — I69998 Other sequelae following unspecified cerebrovascular disease: Secondary | ICD-10-CM | POA: Diagnosis not present

## 2013-01-06 DIAGNOSIS — D649 Anemia, unspecified: Secondary | ICD-10-CM | POA: Diagnosis not present

## 2013-01-06 DIAGNOSIS — R209 Unspecified disturbances of skin sensation: Secondary | ICD-10-CM | POA: Diagnosis not present

## 2013-01-11 ENCOUNTER — Ambulatory Visit (INDEPENDENT_AMBULATORY_CARE_PROVIDER_SITE_OTHER): Payer: Medicare Other | Admitting: Internal Medicine

## 2013-01-11 ENCOUNTER — Ambulatory Visit: Payer: Medicare Other | Admitting: Internal Medicine

## 2013-01-11 ENCOUNTER — Encounter: Payer: Self-pay | Admitting: Internal Medicine

## 2013-01-11 VITALS — BP 129/63 | HR 77 | Temp 97.3°F | Wt 179.5 lb

## 2013-01-11 DIAGNOSIS — I5030 Unspecified diastolic (congestive) heart failure: Secondary | ICD-10-CM

## 2013-01-11 DIAGNOSIS — R0989 Other specified symptoms and signs involving the circulatory and respiratory systems: Secondary | ICD-10-CM | POA: Insufficient documentation

## 2013-01-11 DIAGNOSIS — B351 Tinea unguium: Secondary | ICD-10-CM

## 2013-01-11 DIAGNOSIS — I509 Heart failure, unspecified: Secondary | ICD-10-CM

## 2013-01-11 NOTE — Assessment & Plan Note (Signed)
Podiatry appointment scheduled. She misses health appointments frequently due to transportation issues; will make sure to follow up attendance at next clinic appointment.

## 2013-01-11 NOTE — Progress Notes (Signed)
  Subjective:    Patient ID: Jane [name], female    DOB: Feb 03, 1929, 79 y.o.   MRN: [id number]  HPI [name] is an 79yo F with past medical history HTN, HF with PEF, and a history of headache concerning for temporal arteritis but w/ negative temporal artery biopsy presenting today for hospital follow up.  She was discharged from the hospital on 12/29/12 after an admission for CHF exacerbation in the setting of new prednisone use for presumed temporal arteritis (though biopsy was ultimately negative). At admission, pro BNP was elevated at 1339, chest x-ray showed pulmonary congestion, and exam revealed pitting edema in her legs bilaterally. Repeat echo confirmed grade 1 diastolic dysfunction. At discharge she was started on Lasix 20mg  po bid.  Today she is feeling better. She did go to the ER on 07/04 for shortness of breath, but their assessment did not reveal a recurrent CHF exacerbation. She says she went to the ER because she was short of breath after walking to the bathroom and was told to call 911 anytime she developed dyspnea. She has been "taking it easier" since then with no recurrent dyspnea. She denies chest pain, leg swelling, PND. She uses 1 pillow at night.   Regarding her toenail issues, she has an appointment scheduled with her podiatrist at the [facility] for clipping.  Her headache is resolved. She has been compliant with her prednisone taper with help from her home health aid. She is taking 6 x 5mg  tabs = 30mg  now.    Review of Systems  Constitutional: Negative for fever and chills.  HENT: Negative for congestion and rhinorrhea.   Eyes: Negative for visual disturbance.  Respiratory: Negative for shortness of breath.   Cardiovascular: Negative for chest pain, palpitations and leg swelling.  Gastrointestinal: Negative for nausea and vomiting.  Endocrine: Positive for polyuria (Some increased urination at night in the setting of new Lasix use).  Genitourinary:  Negative for dysuria, urgency, flank pain and difficulty urinating.  Neurological: Negative for dizziness, weakness and headaches.       Objective:   Physical Exam  Constitutional: She is oriented to person, place, and time. She appears well-developed and well-nourished. No distress.  HENT:  Head: Normocephalic and atraumatic.  Eyes: EOM are normal. Pupils are equal, round, and reactive to light.  Ectropion of left lower eyelid  Neck: Normal range of motion. Neck supple. No JVD present.  Cardiovascular: Normal rate, regular rhythm and normal heart sounds.  Exam reveals decreased pulses.   No murmur heard. Pulses:      Dorsalis pedis pulses are 0 on the right side, and 0 on the left side.  Pulmonary/Chest: Effort normal and breath sounds normal.  Abdominal: Soft. Bowel sounds are normal.  Musculoskeletal: She exhibits no edema.  Neurological: She is alert and oriented to person, place, and time.  Skin: Skin is warm and dry.  Gauze dressing in place on left shin.  Long and deformed toenails; no visible ulcers or inflammation.          Assessment & Plan:

## 2013-01-11 NOTE — Assessment & Plan Note (Addendum)
Denies claudication, but she is largely wheelchair bound. - F/u ABIs

## 2013-01-11 NOTE — Patient Instructions (Addendum)
Thanks for your visit.  - Please continue to take your Prednisone taper as instructed. You health aide will help you with this. - Please be sure to make the appointment to see your podiatrist at [facility] for clipping of your toenails. - You have faint pulses in your feet, and we would like to refer you for a study to evaluate the blood flow in your legs called an Ankle Brachial Index. Please have this study done at [facility]. - Please follow up in 1 month.

## 2013-01-11 NOTE — Assessment & Plan Note (Addendum)
No edema, PND, orthopnea, current dyspnea. Appears to be well controlled on current medication regimen.  - Continue Lasix, losartan, ASA, metoprolol - F/u BMP given new to Lasix - No need for cardiology referral at this time due to mild and uncomplicated nature of disease - F/u in 1 month  ADDENDUM: There has been a bump in her creatinine from 1.19 to 1.41 since starting home Lasix 40mg . Patient was called and instructed to stop taking the medicine at this time. If she feels short of breath, if she gains weight, if edema develops, or if she can no longer lie flat, she will start taking it again and call the clinic. She was able to repeat back these instructions to me. She will follow up with our office on 02/08/13 - please assess compliance with this regimen and heart failure symptomatic control.  BMET    Component Value Date/Time   NA 142 01/11/2013 1557   K 4.1 01/11/2013 1557   CL 104 01/11/2013 1557   CO2 23 01/11/2013 1557   GLUCOSE 135* 01/11/2013 1557   BUN 53* 01/11/2013 1557   CREATININE 1.41* 01/11/2013 1557   CREATININE 1.19* 01/01/2013 0505   CALCIUM 8.9 01/11/2013 1557   GFRNONAA 41* 01/01/2013 0505   GFRAA 48* 01/01/2013 0505

## 2013-01-12 DIAGNOSIS — I69998 Other sequelae following unspecified cerebrovascular disease: Secondary | ICD-10-CM | POA: Diagnosis not present

## 2013-01-12 DIAGNOSIS — F411 Generalized anxiety disorder: Secondary | ICD-10-CM | POA: Diagnosis not present

## 2013-01-12 DIAGNOSIS — R209 Unspecified disturbances of skin sensation: Secondary | ICD-10-CM | POA: Diagnosis not present

## 2013-01-12 DIAGNOSIS — I10 Essential (primary) hypertension: Secondary | ICD-10-CM | POA: Diagnosis not present

## 2013-01-12 DIAGNOSIS — L97809 Non-pressure chronic ulcer of other part of unspecified lower leg with unspecified severity: Secondary | ICD-10-CM | POA: Diagnosis not present

## 2013-01-12 DIAGNOSIS — D649 Anemia, unspecified: Secondary | ICD-10-CM | POA: Diagnosis not present

## 2013-01-12 LAB — BASIC METABOLIC PANEL WITH GFR
CO2: 23 mEq/L (ref 19–32)
Chloride: 104 mEq/L (ref 96–112)
Sodium: 142 mEq/L (ref 135–145)

## 2013-01-12 NOTE — Progress Notes (Signed)
I saw patient and discussed her care with Dr. Cater at the time of the visit.  We reviewed the resident's history and exam and pertinent patient test results.  I agree with the assessment, diagnosis, and plan of care documented in the resident's note. 

## 2013-01-13 DIAGNOSIS — F411 Generalized anxiety disorder: Secondary | ICD-10-CM | POA: Diagnosis not present

## 2013-01-13 DIAGNOSIS — I10 Essential (primary) hypertension: Secondary | ICD-10-CM | POA: Diagnosis not present

## 2013-01-13 DIAGNOSIS — D649 Anemia, unspecified: Secondary | ICD-10-CM | POA: Diagnosis not present

## 2013-01-13 DIAGNOSIS — L97809 Non-pressure chronic ulcer of other part of unspecified lower leg with unspecified severity: Secondary | ICD-10-CM | POA: Diagnosis not present

## 2013-01-13 DIAGNOSIS — R209 Unspecified disturbances of skin sensation: Secondary | ICD-10-CM | POA: Diagnosis not present

## 2013-01-14 DIAGNOSIS — I10 Essential (primary) hypertension: Secondary | ICD-10-CM | POA: Diagnosis not present

## 2013-01-14 DIAGNOSIS — I69998 Other sequelae following unspecified cerebrovascular disease: Secondary | ICD-10-CM | POA: Diagnosis not present

## 2013-01-14 DIAGNOSIS — L97809 Non-pressure chronic ulcer of other part of unspecified lower leg with unspecified severity: Secondary | ICD-10-CM | POA: Diagnosis not present

## 2013-01-14 DIAGNOSIS — R209 Unspecified disturbances of skin sensation: Secondary | ICD-10-CM | POA: Diagnosis not present

## 2013-01-14 DIAGNOSIS — D649 Anemia, unspecified: Secondary | ICD-10-CM | POA: Diagnosis not present

## 2013-01-14 DIAGNOSIS — F411 Generalized anxiety disorder: Secondary | ICD-10-CM | POA: Diagnosis not present

## 2013-01-14 MED ORDER — FUROSEMIDE 20 MG PO TABS: 20.0000 mg | ORAL_TABLET | ORAL | Status: DC | PRN

## 2013-01-14 NOTE — Addendum Note (Signed)
Addended by: Kerry-Anne Mezo, Luis Abed on: 01/14/2013 09:55 AM   Modules accepted: Orders

## 2013-01-15 ENCOUNTER — Encounter (HOSPITAL_COMMUNITY): Payer: Self-pay | Admitting: *Deleted

## 2013-01-15 ENCOUNTER — Observation Stay (HOSPITAL_COMMUNITY)
Admission: EM | Admit: 2013-01-15 | Discharge: 2013-01-16 | Disposition: A | Discharge status: Home / Self Care | Payer: Medicare Other | Attending: Internal Medicine | Admitting: Internal Medicine

## 2013-01-15 ENCOUNTER — Emergency Department (HOSPITAL_COMMUNITY): Payer: Medicare Other

## 2013-01-15 DIAGNOSIS — M79622 Pain in left upper arm: Secondary | ICD-10-CM | POA: Diagnosis present

## 2013-01-15 DIAGNOSIS — I10 Essential (primary) hypertension: Secondary | ICD-10-CM | POA: Diagnosis present

## 2013-01-15 DIAGNOSIS — M7520 Bicipital tendinitis, unspecified shoulder: Principal | ICD-10-CM | POA: Insufficient documentation

## 2013-01-15 DIAGNOSIS — Z8673 Personal history of transient ischemic attack (TIA), and cerebral infarction without residual deficits: Secondary | ICD-10-CM | POA: Diagnosis not present

## 2013-01-15 DIAGNOSIS — I509 Heart failure, unspecified: Secondary | ICD-10-CM | POA: Diagnosis not present

## 2013-01-15 DIAGNOSIS — M79609 Pain in unspecified limb: Secondary | ICD-10-CM | POA: Insufficient documentation

## 2013-01-15 DIAGNOSIS — I872 Venous insufficiency (chronic) (peripheral): Secondary | ICD-10-CM | POA: Diagnosis not present

## 2013-01-15 DIAGNOSIS — Z79899 Other long term (current) drug therapy: Secondary | ICD-10-CM | POA: Diagnosis not present

## 2013-01-15 DIAGNOSIS — M79602 Pain in left arm: Secondary | ICD-10-CM

## 2013-01-15 DIAGNOSIS — E785 Hyperlipidemia, unspecified: Secondary | ICD-10-CM | POA: Diagnosis not present

## 2013-01-15 DIAGNOSIS — N183 Chronic kidney disease, stage 3 unspecified: Secondary | ICD-10-CM | POA: Diagnosis not present

## 2013-01-15 DIAGNOSIS — I739 Peripheral vascular disease, unspecified: Secondary | ICD-10-CM | POA: Diagnosis present

## 2013-01-15 DIAGNOSIS — R079 Chest pain, unspecified: Secondary | ICD-10-CM

## 2013-01-15 DIAGNOSIS — I359 Nonrheumatic aortic valve disorder, unspecified: Secondary | ICD-10-CM | POA: Diagnosis not present

## 2013-01-15 DIAGNOSIS — I129 Hypertensive chronic kidney disease with stage 1 through stage 4 chronic kidney disease, or unspecified chronic kidney disease: Secondary | ICD-10-CM | POA: Diagnosis not present

## 2013-01-15 DIAGNOSIS — R0789 Other chest pain: Secondary | ICD-10-CM

## 2013-01-15 DIAGNOSIS — B351 Tinea unguium: Secondary | ICD-10-CM | POA: Diagnosis present

## 2013-01-15 DIAGNOSIS — D649 Anemia, unspecified: Secondary | ICD-10-CM | POA: Diagnosis present

## 2013-01-15 DIAGNOSIS — K219 Gastro-esophageal reflux disease without esophagitis: Secondary | ICD-10-CM | POA: Diagnosis present

## 2013-01-15 DIAGNOSIS — Z7982 Long term (current) use of aspirin: Secondary | ICD-10-CM | POA: Insufficient documentation

## 2013-01-15 DIAGNOSIS — I5030 Unspecified diastolic (congestive) heart failure: Secondary | ICD-10-CM | POA: Diagnosis present

## 2013-01-15 DIAGNOSIS — M1990 Unspecified osteoarthritis, unspecified site: Secondary | ICD-10-CM

## 2013-01-15 DIAGNOSIS — R6889 Other general symptoms and signs: Secondary | ICD-10-CM | POA: Diagnosis not present

## 2013-01-15 LAB — CBC WITH DIFFERENTIAL/PLATELET
Eosinophils Relative: 0 % (ref 0–5)
HCT: 31.5 % — ABNORMAL LOW (ref 36.0–46.0)
Lymphocytes Relative: 12 % (ref 12–46)
Lymphs Abs: 0.9 10*3/uL (ref 0.7–4.0)
MCV: 94.3 fL (ref 78.0–100.0)
Monocytes Absolute: 0.3 10*3/uL (ref 0.1–1.0)
Platelets: 156 10*3/uL (ref 150–400)
RBC: 3.34 MIL/uL — ABNORMAL LOW (ref 3.87–5.11)
WBC: 7.6 10*3/uL (ref 4.0–10.5)

## 2013-01-15 MED ORDER — ASPIRIN 81 MG PO CHEW
324.0000 mg | CHEWABLE_TABLET | Freq: Once | ORAL | Status: AC
  Administered 2013-01-15: 324 mg via ORAL
  Filled 2013-01-15: qty 4

## 2013-01-15 NOTE — ED Notes (Signed)
Pt arrived by [facility] for intermittent left arm pain x 1 week. No distress noted at triage.

## 2013-01-16 ENCOUNTER — Encounter (HOSPITAL_COMMUNITY): Payer: Self-pay | Admitting: General Practice

## 2013-01-16 DIAGNOSIS — M1990 Unspecified osteoarthritis, unspecified site: Secondary | ICD-10-CM

## 2013-01-16 DIAGNOSIS — M79609 Pain in unspecified limb: Secondary | ICD-10-CM

## 2013-01-16 DIAGNOSIS — I10 Essential (primary) hypertension: Secondary | ICD-10-CM | POA: Diagnosis not present

## 2013-01-16 DIAGNOSIS — R0789 Other chest pain: Secondary | ICD-10-CM

## 2013-01-16 DIAGNOSIS — I509 Heart failure, unspecified: Secondary | ICD-10-CM | POA: Diagnosis not present

## 2013-01-16 DIAGNOSIS — M79622 Pain in left upper arm: Secondary | ICD-10-CM | POA: Diagnosis present

## 2013-01-16 LAB — CREATININE, SERUM
Creatinine, Ser: 1.04 mg/dL (ref 0.50–1.10)
GFR calc non Af Amer: 49 mL/min — ABNORMAL LOW (ref >90)

## 2013-01-16 LAB — CBC
MCHC: 33.1 g/dL (ref 30.0–36.0)
RDW: 14.1 % (ref 11.5–15.5)

## 2013-01-16 LAB — TROPONIN I: Troponin I: <0.3 ng/mL (ref <0.30)

## 2013-01-16 MED ORDER — PANTOPRAZOLE SODIUM 40 MG PO TBEC
40.0000 mg | DELAYED_RELEASE_TABLET | Freq: Every day | ORAL | Status: DC
  Administered 2013-01-16: 40 mg via ORAL
  Filled 2013-01-16: qty 1

## 2013-01-16 MED ORDER — ONDANSETRON HCL 4 MG/2ML IJ SOLN: 4.0000 mg | Freq: Four times a day (QID) | INTRAMUSCULAR | Status: DC | PRN

## 2013-01-16 MED ORDER — ACETAMINOPHEN 325 MG PO TABS: 650.0000 mg | ORAL_TABLET | ORAL | Status: DC | PRN

## 2013-01-16 MED ORDER — HEPARIN SODIUM (PORCINE) 5000 UNIT/ML IJ SOLN
5000.0000 [IU] | Freq: Three times a day (TID) | INTRAMUSCULAR | Status: DC
  Administered 2013-01-16 (×2): 5000 [IU] via SUBCUTANEOUS
  Filled 2013-01-16 (×4): qty 1

## 2013-01-16 MED ORDER — TERBINAFINE HCL 1 % EX CREA
TOPICAL_CREAM | Freq: Every day | CUTANEOUS | Status: DC
  Administered 2013-01-16: 10:00:00 via TOPICAL
  Filled 2013-01-16: qty 12

## 2013-01-16 MED ORDER — FERROUS GLUCONATE 324 (38 FE) MG PO TABS
324.0000 mg | ORAL_TABLET | Freq: Every day | ORAL | Status: DC
  Administered 2013-01-16: 324 mg via ORAL
  Filled 2013-01-16: qty 1

## 2013-01-16 MED ORDER — FUROSEMIDE 20 MG PO TABS
20.0000 mg | ORAL_TABLET | ORAL | Status: DC | PRN
  Filled 2013-01-16: qty 1

## 2013-01-16 MED ORDER — ARTIFICIAL TEARS OP OINT
1.0000 "application " | TOPICAL_OINTMENT | Freq: Every evening | OPHTHALMIC | Status: DC | PRN
  Filled 2013-01-16: qty 3.5

## 2013-01-16 MED ORDER — MORPHINE SULFATE 2 MG/ML IJ SOLN: 2.0000 mg | INTRAMUSCULAR | Status: DC | PRN

## 2013-01-16 MED ORDER — ASPIRIN EC 325 MG PO TBEC
325.0000 mg | DELAYED_RELEASE_TABLET | Freq: Every day | ORAL | Status: DC
  Administered 2013-01-16: 325 mg via ORAL
  Filled 2013-01-16: qty 1

## 2013-01-16 MED ORDER — ASPIRIN EC 81 MG PO TBEC: 81.0000 mg | DELAYED_RELEASE_TABLET | Freq: Every day | ORAL | Status: DC

## 2013-01-16 MED ORDER — LOSARTAN POTASSIUM 50 MG PO TABS
50.0000 mg | ORAL_TABLET | Freq: Every day | ORAL | Status: DC
  Administered 2013-01-16: 50 mg via ORAL
  Filled 2013-01-16: qty 1

## 2013-01-16 NOTE — H&P (Signed)
Date: 01/16/2013               Patient Name:  Jane [name] MRN: [id number]  DOB: 11-Jan-1929 Age / Sex: 79 y.o., female   PCP: Manuela Schwartz, MD         Medical Service: Internal Medicine Teaching Service         Attending Physician: Dr. Rocco Serene, MD    First Contact: Dr. Rocco Serene, MD Pager: [phone]  Second Contact: Dr. Janalyn Harder, MD Pager: [phone]       After Hours (After 5p/  First Contact Pager: [phone]  weekends / holidays): Second Contact Pager: [phone]   Chief Complaint: Left Upper Arm Pain  History of Present Illness: Patient is a 79 yo aaf with a PMH of HTN, Stroke, Aortic Insufficiency, GERD, Anxiety, and CKD3.  Patient is a poor historian.  She presents to the ED with left upper arm pain gradually getting worse over the past couple of days.  She denies any trauma, heavy lifting, or recent falls.  She reports some associated left chest pain below her breast which seemed to worsen her arm pain.  She was unable to tell me the duration of her chest pain.  She denies having any fever/chills, SOB, cough, N/V, diaphoresis, or palpitations.  No weakness or other associated symptoms.    Meds: Current Facility-Administered Medications  Medication Dose Route Frequency Provider Last Rate Last Dose  . acetaminophen (TYLENOL) tablet 650 mg  650 mg Oral Q4H PRN Christen Bame, MD      . artificial tears (LACRILUBE) ophthalmic ointment 1 application  1 application Both Eyes QHS,MR X 1 Christen Bame, MD      . aspirin EC tablet 325 mg  325 mg Oral Daily Christen Bame, MD      . ferrous gluconate [facility]) tablet 324 mg  324 mg Oral Daily Christen Bame, MD      . furosemide (LASIX) tablet 20 mg  20 mg Oral PRN Christen Bame, MD      . heparin injection 5,000 Units  5,000 Units Subcutaneous Q8H Christen Bame, MD   5,000 Units at 01/16/13 0537  . losartan (COZAAR) tablet 50 mg  50 mg Oral Daily Christen Bame, MD      . morphine 2 MG/ML injection 2 mg  2 mg Intravenous Q2H PRN Christen Bame,  MD      . ondansetron [facility]) injection 4 mg  4 mg Intravenous Q6H PRN Christen Bame, MD      . pantoprazole (PROTONIX) EC tablet 40 mg  40 mg Oral Daily Christen Bame, MD      . terbinafine (LAMISIL) 1 % cream   Topical Daily Christen Bame, MD        Allergies: Allergies as of 01/15/2013  . (No Known Allergies)   Past Medical History  Diagnosis Date  . Anemia   . Anxiety   . Arthritis   . GERD (gastroesophageal reflux disease)   . Hyperlipidemia   . Hypertension   . Vitamin D deficiency   . Colon polyp     2009 colonoscopy  . Hiatal hernia   . Diverticulosis   . Aortic insufficiency     mild  . Stroke 1975  . CKD (chronic kidney disease) stage 3, GFR 30-59 ml/min    Past Surgical History  Procedure Laterality Date  . Appendectomy    . Abdominal hysterectomy    . Artery biopsy Left 12/16/2012    Procedure: BIOPSY TEMPORAL ARTERY;  Surgeon: Pryor Ochoa, MD;  Location: [facility] OR;  Service: Vascular;  Laterality: Left;   Family History  Problem Relation Age of Onset  . Stroke Mother   . Hypertension Mother   . Stroke Father   . Hypertension Father   . Diabetes Sister    History   Social History  . Marital Status: Widowed    Spouse Name: N/A    Number of Children: N/A  . Years of Education: N/A   Occupational History  . Not on file.   Social History Main Topics  . Smoking status: Former Smoker    Types: Cigarettes    Quit date: 07/01/1984  . Smokeless tobacco: Former Neurosurgeon  . Alcohol Use: No  . Drug Use: No  . Sexually Active: No   Other Topics Concern  . Not on file   Social History Narrative  . No narrative on file    Review of Systems: Pertinent items are noted in HPI.  Physical Exam: Blood pressure 162/72, pulse 56, temperature 97 F (36.1 C), temperature source Oral, resp. rate 16, height 5\' 4"  (1.626 m), weight 81.375 kg (179 lb 6.4 oz), SpO2 96.00%. General: resting in bed comfortably HEENT: PERRL, EOMI, left eye lower lid conjunctiva erythema    Cardiac: RRR, S1,S2 nl; no m/r/g Pulm: CTAB, nl respiratory effort Abd: soft, nontender, nondistended, BS present Ext: warm and well perfused; LUE tender to palpation, no edema; no pedal edema, onychomycosis b/l Neuro: alert and oriented X3, cranial nerves II-XII grossly intact; strength nl in all extremities   CBC:  Recent Labs  01/15/13 2310  WBC 7.6  NEUTROABS 6.4  HGB 10.7*  HCT 31.5*  MCV 94.3  PLT 156     Imaging results:  Dg Chest Port 1 View  01/15/2013   *RADIOLOGY REPORT*  Clinical Data: Intermittent chest pain  PORTABLE CHEST - 1 VIEW  Comparison: 01/01/2013  Findings: Mild enlargement of the cardiomediastinal silhouette is noted with central vascular congestion but no evidence for overt edema.  No new focal pulmonary opacity.  No pleural effusion. Bilateral AC joint degenerative change.  IMPRESSION: Cardiomegaly, no focal acute finding.   Original Report Authenticated By: Christiana Pellant, M.D.    Other results: EKG: Sinus rhythm with marked sinus arrhythmia; occasional PVC noted  Assessment & Plan by Problem:  Patient is an 79 yo aaf with a PMH of HTN, Stroke, Aortic Insufficiency, GERD, Anxiety, and CKD3 who presents with a several days of left-sided upper arm pain and chest pain.  1. Chest pain: Pt atypical CP- resolved with ASA; pain is reproducible with palpation so most likely musculoskeletal.  Pt TIMI score: 3 Pt EKG with some bradycardia and initial trop negative.  CXR shows stable cardiomegaly.  Pt has no other symptoms to suggest URI or GERD. Given reproducibility and inability to raise left arm without pain maybe tendonitis or LUE DVT; pt has h/o DVT in the past. No trauma to shoulder and has no weakness or sensory deficits.   -telemetry -EKG in am  -troponin x2 -LUE venous doppler   2. HTN: Continue home meds   3. Hyperlipidemia: pt Lipid Panel on 6/14 with LDL 94. Not currently on medication  4. VTE: -Heparin 5000 Units 3 times daily   Dispo:  Disposition is deferred at this time, awaiting improvement of current medical problems. Anticipated discharge in approximately 1-2 day(s).   The patient does have a current PCP Ula Lingo Montey Hora, MD) and does need an [facility] hospital follow-up appointment after  discharge.  Signed: Boykin Peek, MD 01/16/2013, 6:38 AM

## 2013-01-16 NOTE — ED Provider Notes (Signed)
History    CSN: [id number] Arrival date & time 01/15/13  1419  First MD Initiated Contact with Patient 01/15/13 2107     Chief Complaint  Patient presents with  . Arm Pain   patient is an extremely vague historian (Consider location/radiation/quality/duration/timing/severity/associated sxs/prior Treatment) Patient is a 79 y.o. female presenting with arm pain.  Arm Pain Associated symptoms include chest pain.    complains of left arm pain and intermittent chest pain onset today. She is in no distress presently. Denies pain presently. Nothing makes symptoms better or worse. She states her arm pain is worse with movement she cannot describe what makes chest pain better or worse chest pain lasts a few minutes at a time. No other associated symptoms. Past Medical History  Diagnosis Date  . Anemia   . Anxiety   . Arthritis   . GERD (gastroesophageal reflux disease)   . Hyperlipidemia   . Hypertension   . Vitamin D deficiency   . Colon polyp     2009 colonoscopy  . Hiatal hernia   . Diverticulosis   . Aortic insufficiency     mild  . Stroke 1975  . CKD (chronic kidney disease) stage 3, GFR 30-59 ml/min    Past Surgical History  Procedure Laterality Date  . Appendectomy    . Abdominal hysterectomy    . Artery biopsy Left 12/16/2012    Procedure: BIOPSY TEMPORAL ARTERY;  Surgeon: Pryor Ochoa, MD;  Location: [facility] OR;  Service: Vascular;  Laterality: Left;   Family History  Problem Relation Age of Onset  . Stroke Mother   . Hypertension Mother   . Stroke Father   . Hypertension Father   . Diabetes Sister    History  Substance Use Topics  . Smoking status: Former Smoker    Types: Cigarettes    Quit date: 07/01/1984  . Smokeless tobacco: Former Neurosurgeon  . Alcohol Use: No   OB History   Grav Para Term Preterm Abortions TAB SAB Ect Mult Living                 Review of Systems  Eyes: Positive for redness.       Left eye with some conjunctival erythema, improving   Cardiovascular: Positive for chest pain.  All other systems reviewed and are negative.    Allergies  Review of patient's allergies indicates no known allergies.  Home Medications   Current Outpatient Rx  Name  Route  Sig  Dispense  Refill  . artificial tears (LACRILUBE) OINT ophthalmic ointment   Ophthalmic   Apply 1 application to eye at bedtime and may repeat dose one time if needed. A thin layer of ointment should be applied to the exposed eyelid, taking care not to touch tip of applicator to eyelid; if complaints of ocular dryness, notify MD   1 Tube   0   . aspirin EC 81 MG tablet   Oral   Take 81 mg by mouth daily.         . Ferrous Gluconate (FERATE) 256 (28 FE) MG TABS   Oral   Take 256 mg by mouth daily.   30 tablet   3   . furosemide (LASIX) 20 MG tablet   Oral   Take 1 tablet (20 mg total) by mouth as needed for fluid or edema.   60 tablet   3   . losartan (COZAAR) 50 MG tablet   Oral   Take 1 tablet (50 mg total)  by mouth daily.   90 tablet   3   . metoprolol (TOPROL-XL) 200 MG 24 hr tablet   Oral   Take 0.5 tablets (100 mg total) by mouth daily.   30 tablet   6   . omeprazole (PRILOSEC) 20 MG capsule   Oral   Take 20 mg by mouth daily. Will sometimes take twice daily if heartburn increases.         . predniSONE (DELTASONE) 5 MG tablet   Oral   Take 7 tablets (35 mg total) by mouth daily with breakfast. Taper by 5mg  per week for 7 weeks   196 tablet   0   . PRESCRIPTION MEDICATION   Left Eye   Place 1 application into the left eye daily at 2 PM daily at 2 PM. Eye gel. Apply to lower left lid daily.         Marland Kitchen terbinafine (LAMISIL) 1 % cream   Topical   Apply topically daily. Apply to affected toes   30 g   0    BP 163/50  Pulse 47  Temp(Src) 97.4 F (36.3 C)  Resp 18  SpO2 100% Physical Exam  Nursing note and vitals reviewed. Constitutional: She appears well-developed and well-nourished.  HENT:  Head: Normocephalic and  atraumatic.  Eyes: Conjunctivae are normal. Pupils are equal, round, and reactive to light.  Left lower eyelid mildly reddened and swollen  Neck: Neck supple. No tracheal deviation present. No thyromegaly present.  Cardiovascular: Normal rate and regular rhythm.   No murmur heard. Pulmonary/Chest: Effort normal and breath sounds normal.  Abdominal: Soft. Bowel sounds are normal. She exhibits no distension. There is no tenderness.  Musculoskeletal: Normal range of motion. She exhibits no edema and no tenderness.  Neurological: She is alert. Coordination normal.  Skin: Skin is warm and dry. No rash noted.  Psychiatric: She has a normal mood and affect.   I-STAT 8 normal i-STAT troponin 0.03 normal ED Course  Procedures (including critical care time) Labs Reviewed  CBC WITH DIFFERENTIAL - Abnormal; Notable for the following:    RBC 3.34 (*)    Hemoglobin 10.7 (*)    HCT 31.5 (*)    Neutrophils Relative % 84 (*)    All other components within normal limits   Dg Chest Port 1 View  01/15/2013   *RADIOLOGY REPORT*  Clinical Data: Intermittent chest pain  PORTABLE CHEST - 1 VIEW  Comparison: 01/01/2013  Findings: Mild enlargement of the cardiomediastinal silhouette is noted with central vascular congestion but no evidence for overt edema.  No new focal pulmonary opacity.  No pleural effusion. Bilateral AC joint degenerative change.  IMPRESSION: Cardiomegaly, no focal acute finding.   Original Report Authenticated By: Christiana Pellant, M.D.   No diagnosis found. Chest xray viewed by me 12:15 AM patient remains asymptomatic.   Date: 01/16/2013  Rate: 75  Rhythm: normal sinus rhythm  QRS Axis: normal  Intervals: normal  ST/T Wave abnormalities: nonspecific T wave changes  Conduction Disutrbances:none  Narrative Interpretation:   Old EKG Reviewed: No significant change from 12/27/2012 interpreted by me  MDM     In light of patient's cardiac risk factors and age I feel the patient to  have inpatient stay for to rule out acute coronary  Syndrome. Spoke with resident physician for internal medicine service who will arrange for inpatient stay  Diagnosis #1chest pain #2 anemia   Doug Sou, MD 01/16/13 0028

## 2013-01-16 NOTE — Progress Notes (Signed)
Client verbalized understanding of discharge instructions.  Released and transported home by daughter in-law.

## 2013-01-16 NOTE — Discharge Summary (Signed)
Name: Jane [name] [name] MRN: [id number] DOB: 07/20/1928 79 y.o. PCP: Manuela Schwartz, MD  Date of Admission: 01/15/2013  8:20 PM Date of Discharge: 01/16/2013 Attending Physician: Dr. Kem Kays  Discharge Diagnosis: 1. Left biceps tendinitis- left upper arm pain thought to be musculoskeletal, ruled out for ACS; discharged with rx for physical therapy and continuation of prednisone taper for recently diagnosed possible temporal arteritis 2. Heart failure with preserved EF 3. Aortic insufficiency, moderate 4. CKD stage 3 5. CVA 6. HTN 7. HL 8. GERD 9. Chronic venous insufficiency, BLE 10. Onchomycosis, severe of bilateral toenails, upcoming appt with podiatry   Discharge Medications:   Medication List         artificial tears Oint ophthalmic ointment  Apply 1 application to eye at bedtime and may repeat dose one time if needed. A thin layer of ointment should be applied to the exposed eyelid, taking care not to touch tip of applicator to eyelid; if complaints of ocular dryness, notify MD     aspirin EC 81 MG tablet  Take 81 mg by mouth daily.     Ferrous Gluconate 256 (28 FE) MG Tabs  Commonly known as:  FERATE  Take 256 mg by mouth daily.     furosemide 20 MG tablet  Commonly known as:  LASIX  Take 1 tablet (20 mg total) by mouth as needed for fluid or edema.     losartan 50 MG tablet  Commonly known as:  COZAAR  Take 1 tablet (50 mg total) by mouth daily.     metoprolol 200 MG 24 hr tablet  Commonly known as:  TOPROL-XL  Take 0.5 tablets (100 mg total) by mouth daily.     omeprazole 20 MG capsule  Commonly known as:  PRILOSEC  Take 20 mg by mouth daily. Will sometimes take twice daily if heartburn increases.     predniSONE 5 MG tablet  Commonly known as:  DELTASONE  Take 7 tablets (35 mg total) by mouth daily with breakfast. Taper by 5mg  per week for 7 weeks     PRESCRIPTION MEDICATION  Place 1 application into the left eye daily at 2 PM daily at 2 PM. Eye  gel. Apply to lower left lid daily.     terbinafine 1 % cream  Commonly known as:  LAMISIL  Apply topically daily. Apply to affected toes        Disposition and follow-up:   Jane [name] was discharged from [facility] in Stable condition.  At the hospital follow up visit please address:  1. Prednisone taper, pt was on the following schedule: Prednisone 25 mg daily from discharge 7/19 until Monday 7/21, Prednisone 20 mg daily from Tuesday 7/22 until Monday 7/28, Prednisone 15 mg daily from Tuesday 7/29 until Monday 8/4, Prednisone 10 mg daily from Tuesday 8/5 until Monday 8/11   2. Physical therapy for left arm, given rx at discharge  3. Onchomycosis of bilateral toenails, had appt with podiatry  4. Consider addition of statin to pt's medication regimen given FLP in 6/14 with LDL 94.   5.  Labs / imaging needed at time of follow-up: none  6.  Pending labs/ test needing follow-up: none  Follow-up Appointments:     Follow-up Information   Follow up with Kristie Cowman, MD On 02/08/2013. (2:15p)    Contact information:   [address] [place] [address] [phone]       Discharge Instructions: Discharge Orders  Future Appointments Provider Department Dept Phone   01/21/2013 2:00 PM [facility] [phone]   02/08/2013 2:15 PM Manuela Schwartz, MD [facility] [phone]   Future Orders Complete By Expires     Call MD for:  severe uncontrolled pain  As directed     Diet - low sodium heart healthy  As directed     Increase activity slowly  As directed        Procedures Performed:  Dg Chest 2 View  01/01/2013   *RADIOLOGY REPORT*  Clinical Data: Chest pain.  CHEST - 2 VIEW  Comparison: CT 12/27/2012, radiograph 12/27/2012.  Findings: Cardiomegaly.  No airspace disease.  No effusion. Basilar atelectasis.  Overlapping soft tissues are present on the  lateral view.  There is no airspace disease.  No pleural effusion.  IMPRESSION: Cardiomegaly without failure.   Original Report Authenticated By: Andreas Newport, M.D.   Ct Angio Chest W/cm &/or Wo Cm  12/27/2012   **ADDENDUM** CREATED: 12/27/2012 09:10:56  Stable T3 and T4 compression deformities.  **END ADDENDUM** SIGNED BY: Marlowe Aschoff. Hoss, M.D.  12/27/2012   *RADIOLOGY REPORT*  Clinical Data: Short of breath  CT ANGIOGRAPHY CHEST  Technique:  Multidetector CT imaging of the chest using the standard protocol during bolus administration of intravenous contrast. Multiplanar reconstructed images including MIPs were obtained and reviewed to evaluate the vascular anatomy.  Contrast: 80mL OMNIPAQUE IOHEXOL 350 MG/ML SOLN  Comparison: 01/13/2008  Findings: There are no filling defects in the pulmonary arterial tree to suggest acute pulmonary thromboembolism.  Atherosclerotic changes of the aorta, great vessels, and coronary artery is diffusely is noted.  Negative abnormal mediastinal adenopathy.  No pericardial effusion.  Low lung volumes.  Scattered atelectasis at the lung bases.  No consolidation or lung mass.  No pneumothorax.  No pleural effusion.  Liver remains nodular in appearance with slight prominence of the left lobe.  These are subtle findings of early possible cirrhosis. Atherosclerotic changes of the aorta in the upper abdomen are present with suspected penetrating atherosclerotic ulcers.  IMPRESSION: No evidence of acute pulmonary thromboembolism.   Original Report Authenticated By: Jolaine Click, M.D.   Dg Chest Port 1 View  01/15/2013   *RADIOLOGY REPORT*  Clinical Data: Intermittent chest pain  PORTABLE CHEST - 1 VIEW  Comparison: 01/01/2013  Findings: Mild enlargement of the cardiomediastinal silhouette is noted with central vascular congestion but no evidence for overt edema.  No new focal pulmonary opacity.  No pleural effusion. Bilateral AC joint degenerative change.  IMPRESSION: Cardiomegaly, no  focal acute finding.   Original Report Authenticated By: Christiana Pellant, M.D.   Dg Chest Portable 1 View  12/27/2012   *RADIOLOGY REPORT*  Clinical Data: Short of breath  PORTABLE CHEST - 1 VIEW  Comparison: 12/12/2012  Findings: The heart is moderately enlarged.  Vascular congestion without interstitial edema.  No pneumothorax.  No pleural effusion.  IMPRESSION: Cardiomegaly and vascular congestion.   Original Report Authenticated By: Jolaine Click, M.D.   Admission HPI:  Patient is a 79 yo aaf with a PMH of HTN, Stroke, Aortic Insufficiency, GERD, Anxiety, and CKD3. Patient is a poor historian. She presents to the ED with left upper arm pain gradually getting worse over the past couple of days. She denies any trauma, heavy lifting, or recent falls. She reports some associated left chest pain below her breast which seemed to worsen her arm pain. She was unable to tell me the duration of  her chest pain. She denies having any fever/chills, SOB, cough, N/V, diaphoresis, or palpitations. No weakness or other associated symptoms.   Hospital Course by problem list:  1. Left biceps tendinitis- Pt presented with left upper arm pain x 2-3 days that radiated to her left shoulder/chest area (though not originating there).  Pt states that the pain began before she saw her home physical therapist a couple of days prior to admission, but she did not do any new exercises or have any trauma to the area.  Pain not thought to be cardiac in etiology given story.  ACS rule out with troponin x 2 negative, CXR showed cardiomegaly with no acute finding.  On exam, pain was reproducible with palpation of pt's left lateral arm and at insertion point of biceps tendon. Some swelling over proximal LUE and pain with raising left arm but no weakness or sensory deficits.  LUE Doppler done to r/o DVT.  Pt was discharged with PCP follow-up on 8/11, prescription for outpatient physical therapy for left arm strengthening.  She was previously  on a prednisone taper for presumed temporal arteritis (questionable compliance) so NSAIDs not prescribed at discharge.  Her taper schedule is as follows: Prednisone 25 mg daily from discharge 7/19 until Monday 7/21, Prednisone 20 mg daily from Tuesday 7/22 until Monday 7/28, Prednisone 15 mg daily from Tuesday 7/29 until Monday 8/4, Prednisone 10 mg daily from Tuesday 8/5 until Monday 8/11  2. Onchomycosis- severe, of bilateral toenails, inhibiting pt from being more active due to pain.  Pt has appt with podiatry to have cut.   Discharge Vitals:   BP 162/72  Pulse 56  Temp(Src) 97 F (36.1 C) (Oral)  Resp 16  Ht 5\' 4"  (1.626 m)  Wt 179 lb 6.4 oz (81.375 kg)  BMI 30.78 kg/m2  SpO2 96%  Discharge Labs:  Results for orders placed during the hospital encounter of 01/15/13 (from the past 24 hour(s))  CBC WITH DIFFERENTIAL     Status: Abnormal   Collection Time    01/15/13 11:10 PM      Result Value Range   WBC 7.6  4.0 - 10.5 K/uL   RBC 3.34 (*) 3.87 - 5.11 MIL/uL   Hemoglobin 10.7 (*) 12.0 - 15.0 g/dL   HCT 14.7 (*) 82.9 - 56.2 %   MCV 94.3  78.0 - 100.0 fL   MCH 32.0  26.0 - 34.0 pg   MCHC 34.0  30.0 - 36.0 g/dL   RDW 13.0  86.5 - 78.4 %   Platelets 156  150 - 400 K/uL   Neutrophils Relative % 84 (*) 43 - 77 %   Neutro Abs 6.4  1.7 - 7.7 K/uL   Lymphocytes Relative 12  12 - 46 %   Lymphs Abs 0.9  0.7 - 4.0 K/uL   Monocytes Relative 4  3 - 12 %   Monocytes Absolute 0.3  0.1 - 1.0 K/uL   Eosinophils Relative 0  0 - 5 %   Eosinophils Absolute 0.0  0.0 - 0.7 K/uL   Basophils Relative 0  0 - 1 %   Basophils Absolute 0.0  0.0 - 0.1 K/uL  CBC     Status: Abnormal   Collection Time    01/16/13  6:45 AM      Result Value Range   WBC 9.3  4.0 - 10.5 K/uL   RBC 3.29 (*) 3.87 - 5.11 MIL/uL   Hemoglobin 10.3 (*) 12.0 - 15.0 g/dL   HCT 69.6 (*)  36.0 - 46.0 %   MCV 94.5  78.0 - 100.0 fL   MCH 31.3  26.0 - 34.0 pg   MCHC 33.1  30.0 - 36.0 g/dL   RDW 16.1  09.6 - 04.5 %    Platelets 157  150 - 400 K/uL  CREATININE, SERUM     Status: Abnormal   Collection Time    01/16/13  6:45 AM      Result Value Range   Creatinine, Ser 1.04  0.50 - 1.10 mg/dL   GFR calc non Af Amer 49 (*) >90 mL/min   GFR calc Af Amer 56 (*) >90 mL/min  TROPONIN I     Status: None   Collection Time    01/16/13  6:45 AM      Result Value Range   Troponin I <0.30  <0.30 ng/mL    Signed: Rocco Serene, MD 01/16/2013, 12:39 PM   Time Spent on Discharge: 35 minutes Services Ordered on Discharge: none Equipment Ordered on Discharge: none

## 2013-01-16 NOTE — Progress Notes (Signed)
Admitted pt to rm 4E16 from ED, pt alert and oriented, denied pain at this time, oriented to room, call bell placed within reach. Admission assessment done, orders carried out. Will continue to monitor.

## 2013-01-16 NOTE — H&P (Signed)
INTERNAL MEDICINE TEACHING SERVICE Attending Admission Note  Date: 01/16/2013  Patient name: Jane [name]  Medical record number: [id number]  Date of birth: [birth year]    I have seen and evaluated Jane [name] and discussed their care with the Residency Team.  84 yr. Old AAF w/ HFpEF, HTN, hx CVA, AI, GERD, CKD 3, presented with left upper arm pain.  She states she has not suffered any trauma recently.  She states the pain in her LUE radiated near her left chest, but did not originate there and has been present for days. She is not very active due partially to some overgrown toenails which she is supposed to see podiatry soon. EKG does not show any acute ST/T changes. A Trop I was negative. On exam, her pain is clearly present over her left bicep/proximal left arm on palpation. She is noted to have some nonpitting edema over her LUE. I agree with LUE doppler to r/o DVT. I do not think this is cardiac in origin.  She can be discharged with PCP F/U. This can be treated with outpatient PT/OT and analgesics. She was on prednisone on admission for presumed Temporal arteritis, so I would treat her for a short term if using an NSAID and make sure she is on a PPI at this time.  [name], DO 7/19/201412:08 PM

## 2013-01-16 NOTE — Progress Notes (Signed)
Utilization review complete 

## 2013-01-18 DIAGNOSIS — F411 Generalized anxiety disorder: Secondary | ICD-10-CM | POA: Diagnosis not present

## 2013-01-18 DIAGNOSIS — L97809 Non-pressure chronic ulcer of other part of unspecified lower leg with unspecified severity: Secondary | ICD-10-CM | POA: Diagnosis not present

## 2013-01-18 DIAGNOSIS — I10 Essential (primary) hypertension: Secondary | ICD-10-CM | POA: Diagnosis not present

## 2013-01-18 DIAGNOSIS — D649 Anemia, unspecified: Secondary | ICD-10-CM | POA: Diagnosis not present

## 2013-01-18 DIAGNOSIS — R209 Unspecified disturbances of skin sensation: Secondary | ICD-10-CM | POA: Diagnosis not present

## 2013-01-18 DIAGNOSIS — I69998 Other sequelae following unspecified cerebrovascular disease: Secondary | ICD-10-CM | POA: Diagnosis not present

## 2013-01-18 NOTE — Discharge Summary (Signed)
  Date: 01/18/2013  Patient name: Jane [name]  Medical record number: [id number]  Date of birth: Oct 12, 1928  This patient has been discussed with the house staff. Please see their note for complete details. I concur with their findings and plan.  [name], DO 01/18/2013, 8:45 PM

## 2013-01-19 DIAGNOSIS — D649 Anemia, unspecified: Secondary | ICD-10-CM | POA: Diagnosis not present

## 2013-01-19 DIAGNOSIS — F411 Generalized anxiety disorder: Secondary | ICD-10-CM | POA: Diagnosis not present

## 2013-01-19 DIAGNOSIS — R209 Unspecified disturbances of skin sensation: Secondary | ICD-10-CM | POA: Diagnosis not present

## 2013-01-19 DIAGNOSIS — L97809 Non-pressure chronic ulcer of other part of unspecified lower leg with unspecified severity: Secondary | ICD-10-CM | POA: Diagnosis not present

## 2013-01-19 DIAGNOSIS — I10 Essential (primary) hypertension: Secondary | ICD-10-CM | POA: Diagnosis not present

## 2013-01-19 DIAGNOSIS — I69998 Other sequelae following unspecified cerebrovascular disease: Secondary | ICD-10-CM | POA: Diagnosis not present

## 2013-01-21 ENCOUNTER — Ambulatory Visit (HOSPITAL_COMMUNITY): Payer: Medicare Other

## 2013-01-22 ENCOUNTER — Ambulatory Visit (HOSPITAL_COMMUNITY): Payer: Medicare Other | Attending: Internal Medicine

## 2013-01-25 ENCOUNTER — Telehealth: Payer: Self-pay | Admitting: *Deleted

## 2013-01-25 ENCOUNTER — Emergency Department (HOSPITAL_COMMUNITY)
Admission: EM | Admit: 2013-01-25 | Discharge: 2013-01-25 | Disposition: A | Discharge status: Home / Self Care | Payer: Medicare Other | Attending: Emergency Medicine | Admitting: Emergency Medicine

## 2013-01-25 ENCOUNTER — Encounter (HOSPITAL_COMMUNITY): Payer: Self-pay | Admitting: Emergency Medicine

## 2013-01-25 DIAGNOSIS — R6889 Other general symptoms and signs: Secondary | ICD-10-CM | POA: Diagnosis not present

## 2013-01-25 DIAGNOSIS — Z8679 Personal history of other diseases of the circulatory system: Secondary | ICD-10-CM | POA: Insufficient documentation

## 2013-01-25 DIAGNOSIS — M129 Arthropathy, unspecified: Secondary | ICD-10-CM | POA: Diagnosis not present

## 2013-01-25 DIAGNOSIS — I129 Hypertensive chronic kidney disease with stage 1 through stage 4 chronic kidney disease, or unspecified chronic kidney disease: Secondary | ICD-10-CM | POA: Diagnosis not present

## 2013-01-25 DIAGNOSIS — Z862 Personal history of diseases of the blood and blood-forming organs and certain disorders involving the immune mechanism: Secondary | ICD-10-CM | POA: Insufficient documentation

## 2013-01-25 DIAGNOSIS — N183 Chronic kidney disease, stage 3 unspecified: Secondary | ICD-10-CM | POA: Insufficient documentation

## 2013-01-25 DIAGNOSIS — Z8719 Personal history of other diseases of the digestive system: Secondary | ICD-10-CM | POA: Diagnosis not present

## 2013-01-25 DIAGNOSIS — K219 Gastro-esophageal reflux disease without esophagitis: Secondary | ICD-10-CM | POA: Diagnosis not present

## 2013-01-25 DIAGNOSIS — Z7982 Long term (current) use of aspirin: Secondary | ICD-10-CM | POA: Diagnosis not present

## 2013-01-25 DIAGNOSIS — M79609 Pain in unspecified limb: Secondary | ICD-10-CM | POA: Insufficient documentation

## 2013-01-25 DIAGNOSIS — I10 Essential (primary) hypertension: Secondary | ICD-10-CM | POA: Diagnosis not present

## 2013-01-25 DIAGNOSIS — Z79899 Other long term (current) drug therapy: Secondary | ICD-10-CM | POA: Diagnosis not present

## 2013-01-25 DIAGNOSIS — Z8659 Personal history of other mental and behavioral disorders: Secondary | ICD-10-CM | POA: Insufficient documentation

## 2013-01-25 DIAGNOSIS — Z8639 Personal history of other endocrine, nutritional and metabolic disease: Secondary | ICD-10-CM | POA: Insufficient documentation

## 2013-01-25 DIAGNOSIS — Z8673 Personal history of transient ischemic attack (TIA), and cerebral infarction without residual deficits: Secondary | ICD-10-CM | POA: Insufficient documentation

## 2013-01-25 DIAGNOSIS — Z860100 Personal history of colon polyps, unspecified: Secondary | ICD-10-CM | POA: Insufficient documentation

## 2013-01-25 DIAGNOSIS — Z87891 Personal history of nicotine dependence: Secondary | ICD-10-CM | POA: Insufficient documentation

## 2013-01-25 DIAGNOSIS — M79602 Pain in left arm: Secondary | ICD-10-CM

## 2013-01-25 MED ORDER — ACETAMINOPHEN 325 MG PO TABS
650.0000 mg | ORAL_TABLET | Freq: Once | ORAL | Status: AC
  Administered 2013-01-25: 650 mg via ORAL
  Filled 2013-01-25: qty 2

## 2013-01-25 MED ORDER — ACETAMINOPHEN 325 MG PO TABS: 650.0000 mg | ORAL_TABLET | Freq: Four times a day (QID) | ORAL | Status: DC | PRN

## 2013-01-25 NOTE — ED Notes (Signed)
Per EMS-pt c/o of left arm pain starting in shoulder radiating down to elbow. Diagnosed with tendonitis. Hx of anxiety, thinks "this caused my flare up, got a little upset this morning. ". NAD at this time.

## 2013-01-25 NOTE — ED Provider Notes (Signed)
CSN: [id number]     Arrival date & time 01/25/13  1018 History     First MD Initiated Contact with Patient 01/25/13 1038     Chief Complaint  Patient presents with  . Arm Injury   (Consider location/radiation/quality/duration/timing/severity/associated sxs/prior Treatment) Patient is a 79 y.o. female presenting with extremity pain.  Extremity Pain This is a new problem. The current episode started more than 1 week ago. The problem occurs constantly. The problem has not changed since onset.Pertinent negatives include no chest pain, no abdominal pain and no shortness of breath. Associated symptoms comments: No weakness, numbness, or tingling.. Exacerbated by: movement, use of extremity. Nothing relieves the symptoms. Treatments tried: prednisone.    Past Medical History  Diagnosis Date  . Anemia   . Anxiety   . Arthritis   . GERD (gastroesophageal reflux disease)   . Hyperlipidemia   . Hypertension   . Vitamin D deficiency   . Colon polyp     2009 colonoscopy  . Hiatal hernia   . Diverticulosis   . Aortic insufficiency     mild  . Stroke 1975  . CKD (chronic kidney disease) stage 3, GFR 30-59 ml/min    Past Surgical History  Procedure Laterality Date  . Appendectomy    . Abdominal hysterectomy    . Artery biopsy Left 12/16/2012    Procedure: BIOPSY TEMPORAL ARTERY;  Surgeon: Pryor Ochoa, MD;  Location: [facility] OR;  Service: Vascular;  Laterality: Left;   Family History  Problem Relation Age of Onset  . Stroke Mother   . Hypertension Mother   . Stroke Father   . Hypertension Father   . Diabetes Sister    History  Substance Use Topics  . Smoking status: Former Smoker    Types: Cigarettes    Quit date: 07/01/1984  . Smokeless tobacco: Former Neurosurgeon  . Alcohol Use: No   OB History   Grav Para Term Preterm Abortions TAB SAB Ect Mult Living                 Review of Systems  Constitutional: Negative for fever.  Respiratory: Negative for cough and shortness of  breath.   Cardiovascular: Negative for chest pain.  Gastrointestinal: Negative for nausea, vomiting, abdominal pain and diarrhea.  All other systems reviewed and are negative.    Allergies  Review of patient's allergies indicates no known allergies.  Home Medications   Current Outpatient Rx  Name  Route  Sig  Dispense  Refill  . artificial tears (LACRILUBE) OINT ophthalmic ointment   Ophthalmic   Apply 1 application to eye at bedtime and may repeat dose one time if needed. A thin layer of ointment should be applied to the exposed eyelid, taking care not to touch tip of applicator to eyelid; if complaints of ocular dryness, notify MD   1 Tube   0   . aspirin EC 81 MG tablet   Oral   Take 81 mg by mouth daily.         . Ferrous Gluconate (FERATE) 256 (28 FE) MG TABS   Oral   Take 256 mg by mouth daily.   30 tablet   3   . losartan (COZAAR) 50 MG tablet   Oral   Take 1 tablet (50 mg total) by mouth daily.   90 tablet   3   . metoprolol (TOPROL-XL) 200 MG 24 hr tablet   Oral   Take 0.5 tablets (100 mg total) by mouth daily.  30 tablet   6   . omeprazole (PRILOSEC) 20 MG capsule   Oral   Take 20 mg by mouth daily. Will sometimes take twice daily if heartburn increases.         . predniSONE (DELTASONE) 5 MG tablet   Oral   Take 7 tablets (35 mg total) by mouth daily with breakfast. Taper by 5mg  per week for 7 weeks   196 tablet   0    BP 168/72  Pulse 78  Temp(Src) 98.8 F (37.1 C) (Oral)  Resp 20  SpO2 98% Physical Exam  Nursing note and vitals reviewed. Constitutional: She is oriented to person, place, and time. She appears well-developed and well-nourished. No distress.  elderly  HENT:  Head: Normocephalic and atraumatic.  Mouth/Throat: Oropharynx is clear and moist.  Eyes: Conjunctivae are normal. Pupils are equal, round, and reactive to light. No scleral icterus.  Neck: Neck supple.  Cardiovascular: Normal rate, regular rhythm, normal heart  sounds and intact distal pulses.   No murmur heard. Pulmonary/Chest: Effort normal and breath sounds normal. No stridor. No respiratory distress. She has no rales.  Abdominal: Soft. Bowel sounds are normal. She exhibits no distension. There is no tenderness.  Musculoskeletal: Normal range of motion.       Right upper arm: She exhibits tenderness (pain with active ROM). She exhibits no swelling, no edema, no deformity (2+ distal pulses, sensation and motor intact.) and no laceration.  Neurological: She is alert and oriented to person, place, and time.  Skin: Skin is warm and dry. No rash noted.  Psychiatric: She has a normal mood and affect. Her behavior is normal.    ED Course   Procedures (including critical care time)  Labs Reviewed - No data to display No results found.  EKG - sinus, rate 80, normal axis, normal intervals, PVCs, no ST/T changes, similar to prior.   1. Left arm pain     MDM  79 yo female with left arm pain.  Recent admission for cardiac rule out for this pain, ultimately diagnosed as biceps tendonitis by primary care team.  She states "I don't want to be admitted this time, I just want some medicine."  She was not discharged with any pain medication according to record.  However, he primary team did advise against NSAIDs.  Given tylenol with some improvement.  Advised this and PCP follow up if symptoms continue.  I do not think these symptoms represent ACS.    Candyce Churn, MD 01/26/13 1201

## 2013-01-25 NOTE — ED Notes (Signed)
Spoke to pt's daughter who is coming to pick pt up

## 2013-01-25 NOTE — Telephone Encounter (Signed)
Call from [facility] Physical Therapist with [facility] - # [phone] PT would like to continue Home Health PT  Twice a week for 2 weeks for gait and balance.  I can give a VO.

## 2013-01-28 DIAGNOSIS — I10 Essential (primary) hypertension: Secondary | ICD-10-CM | POA: Diagnosis not present

## 2013-01-28 DIAGNOSIS — L97809 Non-pressure chronic ulcer of other part of unspecified lower leg with unspecified severity: Secondary | ICD-10-CM | POA: Diagnosis not present

## 2013-01-28 DIAGNOSIS — R209 Unspecified disturbances of skin sensation: Secondary | ICD-10-CM | POA: Diagnosis not present

## 2013-01-28 DIAGNOSIS — D649 Anemia, unspecified: Secondary | ICD-10-CM | POA: Diagnosis not present

## 2013-01-28 DIAGNOSIS — F411 Generalized anxiety disorder: Secondary | ICD-10-CM | POA: Diagnosis not present

## 2013-02-01 ENCOUNTER — Telehealth: Payer: Self-pay | Admitting: *Deleted

## 2013-02-01 NOTE — Telephone Encounter (Signed)
Call from [facility], Physical Therapist with [facility] - # [phone]  PT is requesting a VO to continue home therapy twice a week for 3 more weeks. Pt has developed left shoulder pain.  They will work on gait, balance and L shoulder pain.  Will this be okay with you?

## 2013-02-03 ENCOUNTER — Other Ambulatory Visit: Payer: Self-pay | Admitting: *Deleted

## 2013-02-03 MED ORDER — OMEPRAZOLE 20 MG PO CPDR: 20.0000 mg | DELAYED_RELEASE_CAPSULE | Freq: Every day | ORAL | Status: DC

## 2013-02-04 NOTE — Telephone Encounter (Signed)
Ok with me 

## 2013-02-06 ENCOUNTER — Emergency Department (HOSPITAL_COMMUNITY): Payer: Medicare Other

## 2013-02-06 ENCOUNTER — Inpatient Hospital Stay (HOSPITAL_COMMUNITY)
Admission: EM | Admit: 2013-02-06 | Discharge: 2013-02-10 | DRG: 689 | Disposition: A | Discharge status: Home Health | Payer: Medicare Other | Attending: Internal Medicine | Admitting: Internal Medicine

## 2013-02-06 ENCOUNTER — Encounter (HOSPITAL_COMMUNITY): Payer: Self-pay | Admitting: Emergency Medicine

## 2013-02-06 DIAGNOSIS — Z8673 Personal history of transient ischemic attack (TIA), and cerebral infarction without residual deficits: Secondary | ICD-10-CM

## 2013-02-06 DIAGNOSIS — E559 Vitamin D deficiency, unspecified: Secondary | ICD-10-CM | POA: Diagnosis present

## 2013-02-06 DIAGNOSIS — I129 Hypertensive chronic kidney disease with stage 1 through stage 4 chronic kidney disease, or unspecified chronic kidney disease: Secondary | ICD-10-CM | POA: Diagnosis not present

## 2013-02-06 DIAGNOSIS — N179 Acute kidney failure, unspecified: Secondary | ICD-10-CM | POA: Diagnosis not present

## 2013-02-06 DIAGNOSIS — K5730 Diverticulosis of large intestine without perforation or abscess without bleeding: Secondary | ICD-10-CM | POA: Diagnosis present

## 2013-02-06 DIAGNOSIS — R0609 Other forms of dyspnea: Secondary | ICD-10-CM | POA: Diagnosis not present

## 2013-02-06 DIAGNOSIS — N390 Urinary tract infection, site not specified: Secondary | ICD-10-CM | POA: Diagnosis not present

## 2013-02-06 DIAGNOSIS — I509 Heart failure, unspecified: Secondary | ICD-10-CM | POA: Diagnosis not present

## 2013-02-06 DIAGNOSIS — A498 Other bacterial infections of unspecified site: Secondary | ICD-10-CM | POA: Diagnosis present

## 2013-02-06 DIAGNOSIS — F411 Generalized anxiety disorder: Secondary | ICD-10-CM | POA: Diagnosis present

## 2013-02-06 DIAGNOSIS — N183 Chronic kidney disease, stage 3 unspecified: Secondary | ICD-10-CM | POA: Diagnosis not present

## 2013-02-06 DIAGNOSIS — I10 Essential (primary) hypertension: Secondary | ICD-10-CM | POA: Diagnosis not present

## 2013-02-06 DIAGNOSIS — E785 Hyperlipidemia, unspecified: Secondary | ICD-10-CM | POA: Diagnosis present

## 2013-02-06 DIAGNOSIS — M7989 Other specified soft tissue disorders: Secondary | ICD-10-CM

## 2013-02-06 DIAGNOSIS — M129 Arthropathy, unspecified: Secondary | ICD-10-CM | POA: Diagnosis present

## 2013-02-06 DIAGNOSIS — I5033 Acute on chronic diastolic (congestive) heart failure: Secondary | ICD-10-CM | POA: Diagnosis present

## 2013-02-06 DIAGNOSIS — M1990 Unspecified osteoarthritis, unspecified site: Secondary | ICD-10-CM | POA: Diagnosis present

## 2013-02-06 DIAGNOSIS — I5030 Unspecified diastolic (congestive) heart failure: Secondary | ICD-10-CM

## 2013-02-06 DIAGNOSIS — K449 Diaphragmatic hernia without obstruction or gangrene: Secondary | ICD-10-CM | POA: Diagnosis present

## 2013-02-06 DIAGNOSIS — Z87891 Personal history of nicotine dependence: Secondary | ICD-10-CM | POA: Diagnosis not present

## 2013-02-06 DIAGNOSIS — R0989 Other specified symptoms and signs involving the circulatory and respiratory systems: Secondary | ICD-10-CM | POA: Diagnosis not present

## 2013-02-06 DIAGNOSIS — D649 Anemia, unspecified: Secondary | ICD-10-CM | POA: Diagnosis present

## 2013-02-06 DIAGNOSIS — I359 Nonrheumatic aortic valve disorder, unspecified: Secondary | ICD-10-CM | POA: Diagnosis present

## 2013-02-06 DIAGNOSIS — M79609 Pain in unspecified limb: Secondary | ICD-10-CM

## 2013-02-06 DIAGNOSIS — R0602 Shortness of breath: Secondary | ICD-10-CM | POA: Diagnosis not present

## 2013-02-06 DIAGNOSIS — K219 Gastro-esophageal reflux disease without esophagitis: Secondary | ICD-10-CM | POA: Diagnosis present

## 2013-02-06 DIAGNOSIS — R0600 Dyspnea, unspecified: Secondary | ICD-10-CM

## 2013-02-06 HISTORY — DX: Heart failure, unspecified: I50.9

## 2013-02-06 LAB — CBC
HCT: 29.9 % — ABNORMAL LOW (ref 36.0–46.0)
Hemoglobin: 10.1 g/dL — ABNORMAL LOW (ref 12.0–15.0)
RBC: 3.2 MIL/uL — ABNORMAL LOW (ref 3.87–5.11)
WBC: 5.1 10*3/uL (ref 4.0–10.5)

## 2013-02-06 LAB — COMPREHENSIVE METABOLIC PANEL
ALT: 10 U/L (ref 0–35)
Alkaline Phosphatase: 64 U/L (ref 39–117)
BUN: 18 mg/dL (ref 6–23)
Chloride: 107 mEq/L (ref 96–112)
GFR calc Af Amer: 63 mL/min — ABNORMAL LOW (ref >90)
Glucose, Bld: 89 mg/dL (ref 70–99)
Potassium: 3.7 mEq/L (ref 3.5–5.1)
Sodium: 141 mEq/L (ref 135–145)
Total Bilirubin: 0.5 mg/dL (ref 0.3–1.2)

## 2013-02-06 LAB — URINALYSIS, ROUTINE W REFLEX MICROSCOPIC
Bilirubin Urine: NEGATIVE
Glucose, UA: NEGATIVE mg/dL
Ketones, ur: NEGATIVE mg/dL
Nitrite: POSITIVE — AB
Protein, ur: 100 mg/dL — AB

## 2013-02-06 LAB — POCT I-STAT, CHEM 8
Calcium, Ion: 1.14 mmol/L (ref 1.13–1.30)
Chloride: 110 mEq/L (ref 96–112)
Creatinine, Ser: 1 mg/dL (ref 0.50–1.10)
Glucose, Bld: 87 mg/dL (ref 70–99)
HCT: 29 % — ABNORMAL LOW (ref 36.0–46.0)
Potassium: 3.6 mEq/L (ref 3.5–5.1)

## 2013-02-06 LAB — PRO B NATRIURETIC PEPTIDE: Pro B Natriuretic peptide (BNP): 752.6 pg/mL — ABNORMAL HIGH (ref 0–450)

## 2013-02-06 LAB — URINE MICROSCOPIC-ADD ON

## 2013-02-06 LAB — TROPONIN I: Troponin I: <0.3 ng/mL (ref <0.30)

## 2013-02-06 LAB — PROTIME-INR
INR: 1.04 (ref 0.00–1.49)
Prothrombin Time: 13.4 seconds (ref 11.6–15.2)

## 2013-02-06 MED ORDER — FUROSEMIDE 10 MG/ML IJ SOLN
20.0000 mg | Freq: Once | INTRAMUSCULAR | Status: AC
  Administered 2013-02-06: 20 mg via INTRAVENOUS
  Filled 2013-02-06: qty 2

## 2013-02-06 MED ORDER — PANTOPRAZOLE SODIUM 40 MG PO TBEC
40.0000 mg | DELAYED_RELEASE_TABLET | Freq: Every day | ORAL | Status: DC
  Administered 2013-02-06 – 2013-02-10 (×5): 40 mg via ORAL
  Filled 2013-02-06 (×5): qty 1

## 2013-02-06 MED ORDER — DEXTROSE 5 % IV SOLN
1.0000 g | Freq: Once | INTRAVENOUS | Status: AC
  Administered 2013-02-06: 1 g via INTRAVENOUS
  Filled 2013-02-06: qty 10

## 2013-02-06 MED ORDER — SODIUM CHLORIDE 0.9 % IJ SOLN: 3.0000 mL | INTRAMUSCULAR | Status: DC | PRN

## 2013-02-06 MED ORDER — FUROSEMIDE 40 MG PO TABS
40.0000 mg | ORAL_TABLET | Freq: Every day | ORAL | Status: DC
  Administered 2013-02-07: 40 mg via ORAL
  Filled 2013-02-06: qty 1

## 2013-02-06 MED ORDER — PREDNISONE 10 MG PO TABS
10.0000 mg | ORAL_TABLET | Freq: Every day | ORAL | Status: DC
  Administered 2013-02-07 – 2013-02-08 (×2): 10 mg via ORAL
  Filled 2013-02-06 (×3): qty 1

## 2013-02-06 MED ORDER — ASPIRIN EC 81 MG PO TBEC
81.0000 mg | DELAYED_RELEASE_TABLET | Freq: Every day | ORAL | Status: DC
  Administered 2013-02-06 – 2013-02-10 (×5): 81 mg via ORAL
  Filled 2013-02-06 (×5): qty 1

## 2013-02-06 MED ORDER — SODIUM CHLORIDE 0.9 % IV SOLN: 250.0000 mL | INTRAVENOUS | Status: DC | PRN

## 2013-02-06 MED ORDER — METOPROLOL SUCCINATE ER 100 MG PO TB24
100.0000 mg | ORAL_TABLET | Freq: Every day | ORAL | Status: DC
  Administered 2013-02-06 – 2013-02-10 (×5): 100 mg via ORAL
  Filled 2013-02-06 (×5): qty 1

## 2013-02-06 MED ORDER — ARTIFICIAL TEARS OP OINT
1.0000 "application " | TOPICAL_OINTMENT | Freq: Every evening | OPHTHALMIC | Status: DC | PRN
  Administered 2013-02-06 – 2013-02-09 (×4): 1 via OPHTHALMIC
  Filled 2013-02-06 (×2): qty 3.5

## 2013-02-06 MED ORDER — CEFTRIAXONE SODIUM 1 G IJ SOLR
1.0000 g | INTRAMUSCULAR | Status: DC
  Administered 2013-02-07: 1 g via INTRAVENOUS
  Filled 2013-02-06: qty 10

## 2013-02-06 MED ORDER — SODIUM CHLORIDE 0.9 % IJ SOLN: 3.0000 mL | Freq: Two times a day (BID) | INTRAMUSCULAR | Status: DC

## 2013-02-06 MED ORDER — POTASSIUM CHLORIDE CRYS ER 20 MEQ PO TBCR
40.0000 meq | EXTENDED_RELEASE_TABLET | Freq: Once | ORAL | Status: AC
  Administered 2013-02-06: 40 meq via ORAL
  Filled 2013-02-06: qty 2

## 2013-02-06 MED ORDER — FERROUS GLUCONATE 256 (28 FE) MG PO TABS: 256.0000 mg | ORAL_TABLET | Freq: Every day | ORAL | Status: DC

## 2013-02-06 MED ORDER — LOSARTAN POTASSIUM 50 MG PO TABS
50.0000 mg | ORAL_TABLET | Freq: Every day | ORAL | Status: DC
  Administered 2013-02-06 – 2013-02-10 (×5): 50 mg via ORAL
  Filled 2013-02-06 (×5): qty 1

## 2013-02-06 MED ORDER — ACETAMINOPHEN 325 MG PO TABS: 650.0000 mg | ORAL_TABLET | Freq: Four times a day (QID) | ORAL | Status: DC | PRN

## 2013-02-06 MED ORDER — HEPARIN SODIUM (PORCINE) 5000 UNIT/ML IJ SOLN
5000.0000 [IU] | Freq: Three times a day (TID) | INTRAMUSCULAR | Status: DC
  Administered 2013-02-06 – 2013-02-10 (×12): 5000 [IU] via SUBCUTANEOUS
  Filled 2013-02-06 (×15): qty 1

## 2013-02-06 MED ORDER — SODIUM CHLORIDE 0.9 % IJ SOLN
3.0000 mL | Freq: Two times a day (BID) | INTRAMUSCULAR | Status: DC
  Administered 2013-02-06 – 2013-02-10 (×8): 3 mL via INTRAVENOUS

## 2013-02-06 MED ORDER — FERROUS GLUCONATE 324 (38 FE) MG PO TABS
324.0000 mg | ORAL_TABLET | Freq: Every day | ORAL | Status: DC
  Administered 2013-02-06 – 2013-02-10 (×5): 324 mg via ORAL
  Filled 2013-02-06 (×6): qty 1

## 2013-02-06 NOTE — Progress Notes (Signed)
Pt states that she is from home alone, with sister near by. Pt states she does not see a cardiologist. Accord to home med lsit pt is not on lasix (chf pt). Pt also states she dose not weight herself d/t not have a scale. Pt was d/c from Hp 3 week ago.  Hf failure book and education giving to Pt.  Pt states she has home health aid, but not a Charity fundraiser.   D/t risk of readmition  Pt may benefit from [facility] RN, [facility] network and a cardiologist to recent readmitions     Will continue to monitor Pt

## 2013-02-06 NOTE — Progress Notes (Addendum)
Hf video not working current. Charge Rn looking into issue  Md not checking for PE at this time. Will continue to monitor for CP or worsen SOB

## 2013-02-06 NOTE — ED Notes (Signed)
PT. ARRIVED WITH EMS FROM HOME REPORTS SOB ONSET YESTERDAY , PT. STATED RELIEF AFTER RECEIVING OXYGEN ( 2LPM/[place]) BY EMS . DENIES CHEST PAIN . ALERT AND ORIENTED .

## 2013-02-06 NOTE — Progress Notes (Signed)
VASCULAR LAB PRELIMINARY  PRELIMINARY  PRELIMINARY  PRELIMINARY  Right lower extremity venous Doppler completed.    Preliminary report:  There is no DVT or SVT noted in the right lower extremity.  Danaria Larsen, RVT 02/06/2013, 10:14 AM

## 2013-02-06 NOTE — ED Notes (Signed)
Patient ambulated.  Unsteady gate.  Heart increased to over 122.  0-2 saturation dropped to low 90's. resperations increased 32resperations.
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
## 2013-02-06 NOTE — Progress Notes (Signed)
Pt complaining of leg cramps, mustard given will continue to monitor
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
## 2013-02-06 NOTE — H&P (Signed)
Date: 02/06/2013               Patient Name:  Jane [name] MRN: [id number]  DOB: 1928/12/08 Age / Sex: 79 y.o., female   PCP: Manuela Schwartz, MD         Medical Service: Internal Medicine Teaching Service         Attending Physician: Dr. [name], DO    First Contact: Dr. [name] Pager: [phone]  Second Contact: Dr. Lorretta Harp Pager: [phone]       After Hours (After 5p/  First Contact Pager: [phone]  weekends / holidays): Second Contact Pager: [phone]   Chief Complaint: SOB and foul smelling urine  History of Present Illness: Jane [name] is an 79 yo female with a PMH of HF with PEF, HTN, CVA and CKD3 who presents with primary complaint of SOB since yesterday.  Jane [name] was diagnosed with HF with PEF during 06/29-07/01/14 hospitalization for SOB and was discharged on Lasix 20mg  BID.   Jane [name] is followed in the internal medicine clinic and was told on 01/14/13 to stop taking Lasix due to recent bump in Cr (1.19--> 1.41).  She was to resume Lasix if she developed CHF symptoms.  Her current episode of dyspnea began yesterday and  is worse with exertion. She is still able to move about her apartment and perform ADLs without becoming SOB.  She denies PND and uses 1 pillow at night.  She denies CP but reports lower extremity swelling (R>L). She is still able to move about her apartment and perform ADLs without becoming SOB.  She has not traveled recently or been immobile.  She gets home PT.  Additionally, Jane [name] notes a history of strong smelling urine for more than 1 week.  She denies dysuria, hematuria, fever or back pain.   Jane [name] was last hospitalized 07/18-07/19/14 for L upper arm pain and discharged with a diagnosis of Left biceps tendinitis.   In the ED:  SpO2 96% on RA, RR 18, HR 87, BP 158/55, T 99.3 oral; CXR with mild vascular congestion, clear lungs; UA +.  Pt given 20mg  IV Lasix and 2g IV Rocephin  Meds: No current facility-administered  medications for this encounter.   Allergies: Allergies as of 02/06/2013  . (No Known Allergies)   Past Medical History  Diagnosis Date  . Anemia   . Anxiety   . Arthritis   . GERD (gastroesophageal reflux disease)   . Hyperlipidemia   . Hypertension   . Vitamin D deficiency   . Colon polyp     2009 colonoscopy  . Hiatal hernia   . Diverticulosis   . Aortic insufficiency     mild  . Stroke 1975  . CKD (chronic kidney disease) stage 3, GFR 30-59 ml/min   . CHF (congestive heart failure)    Past Surgical History  Procedure Laterality Date  . Appendectomy    . Abdominal hysterectomy    . Artery biopsy Left 12/16/2012    Procedure: BIOPSY TEMPORAL ARTERY;  Surgeon: Pryor Ochoa, MD;  Location: [facility];  Service: Vascular;  Laterality: Left;   Family History  Problem Relation Age of Onset  . Stroke Mother   . Hypertension Mother   . Stroke Father   . Hypertension Father   . Diabetes Sister    History   Social History  . Marital Status: Widowed    Spouse Name: N/A    Number of Children: N/A  . Years  of Education: N/A   Occupational History  . Not on file.   Social History Main Topics  . Smoking status: Former Smoker    Types: Cigarettes    Quit date: 07/01/1984  . Smokeless tobacco: Former Neurosurgeon  . Alcohol Use: No  . Drug Use: No  . Sexually Active: No   Other Topics Concern  . Not on file   Social History Narrative  . No narrative on file    Review of Systems: Pertinent items are noted in HPI.  Physical Exam: Blood pressure 133/51, pulse 82, temperature 99.3 F (37.4 C), temperature source Oral, resp. rate 21, SpO2 99.00%. General: resting in bed in NAD, responding appropriately HEENT: L lower lid ectropion, no JVD appreciated Cardiac: irregular rhythm, no rubs, murmurs or gallops Pulm: clear to auscultation bilaterally, moving normal volumes of air, on 3L via  Abd: soft, nontender, nondistended, BS present, no suprapubic tenderness, no  CVAT Ext: warm and well perfused, B/L lower extremity +1 pitting edema (R>L), negative Homan's sign Neuro: alert and oriented X3  Lab results: Basic Metabolic Panel:  Recent Labs  65/78/46 0542 02/06/13 0557  NA 141 144  K 3.7 3.6  CL 107 110  CO2 22  --   GLUCOSE 89 87  BUN 18 16  CREATININE 0.95 1.00  CALCIUM 9.1  --    Liver Function Tests:  Recent Labs  02/06/13 0542  AST 15  ALT 10  ALKPHOS 64  BILITOT 0.5  PROT 6.2  ALBUMIN 3.2*   CBC:  Recent Labs  02/06/13 0542 02/06/13 0557  WBC 5.1  --   HGB 10.1* 9.9*  HCT 29.9* 29.0*  MCV 93.4  --   PLT 181  --    Cardiac Enzymes: No results found for this basename: CKTOTAL, CKMB, CKMBINDEX, TROPONINI,  in the last 72 hours BNP:  Recent Labs  02/06/13 0706  PROBNP 752.6*   D-Dimer:  Recent Labs  02/06/13 0701  DDIMER 3.58*   Urinalysis:  Recent Labs  02/06/13 0634  COLORURINE YELLOW  LABSPEC 1.012  PHURINE 6.0  GLUCOSEU NEGATIVE  HGBUR TRACE*  BILIRUBINUR NEGATIVE  KETONESUR NEGATIVE  PROTEINUR 100*  UROBILINOGEN 0.2  NITRITE POSITIVE*  LEUKOCYTESUR SMALL*    Imaging results:  Dg Chest Portable 1 View  02/06/2013   *RADIOLOGY REPORT*  Clinical Data: Shortness of breath.  PORTABLE CHEST - 1 VIEW  Comparison: Chest radiograph performed 01/15/2013  Findings: The lungs are well-aerated.  Mild vascular congestion is seen.  There is no evidence of focal opacification, pleural effusion or pneumothorax.  The cardiomediastinal silhouette is borderline normal in size; calcification is noted in the aortic arch.  No acute osseous abnormalities are seen.  IMPRESSION: Mild vascular congestion seen; lungs remain grossly clear.   Original Report Authenticated By: Tonia Ghent, M.D.   Other results: EKG: normal sinus rhythm  Assessment & Plan by Problem: 79 yo female with a PMH of HF with PEF, HTN, aortic insufficiency, CVA, CKD3 presenting with SOB and foul smelling urine.  #SOB 2/2 CHF exacerbation  - Lasix 20mg  BID was started after diagnosis of HF with preserved EF (06/29-07/01/14 admision) found on Echo.  However, Lasix d/c at 01/14/13 clinic visit due to increased Cr.  Pt not overtly volume overloaded today but has some lower extremity edema, CXR with mild vascular congestion and Pro-BNP elevated at 752.6.  PE less likely as pt without pleuritic CP, not tachycardiac, no recent travel history or known malignancy (Wells score 3 for R >  L leg edema).  RLE venous duplex negative for DVT.  ACS unlikely as pt without CP, Troponin neg and no EKG changes. - admit for observation to IMTS - telemetry monitoring - IV Lasix 40mg  daily - continue oxygen at 2L via [place], keep SpO2 > 92% - monitor I/Os and daily weights  #UTI - pt with complaint of foul smelling urine and UA+. 1g IV Rocephin given in the ED. - continue 1g Rocephin daily - urine culture  #Hypertension - on losartan 50mg  daily and Toprol XL 100mg  daily at home - continue home meds  #irregular HR - pt without know Afib and ED EKG showed NSR, however, irregularly irregular on exam.   - repeat EKG - pt already on metoprolol  #presumed temporal arteritis, biopsy ultimately negative - complete Prednisone taper, currently 10mg  daily until 02/08/13 then stop  #GERD - Protonix  Dispo: Disposition is deferred at this time, awaiting improvement of current medical problems. Anticipated discharge in approximately 1-2 day(s).   The patient does have a current PCP Ula Lingo Montey Hora, MD) and does need an [facility] hospital follow-up appointment after discharge.  The patient does not have transportation limitations that hinder transportation to clinic appointments.  Signed: [name], DO 02/06/2013, 10:53 AM

## 2013-02-06 NOTE — Progress Notes (Signed)
Pt rec from ED, Pt o4x

## 2013-02-06 NOTE — Progress Notes (Signed)
Call ED for report nurse unavailable at time of call. Left message for return call

## 2013-02-06 NOTE — ED Provider Notes (Signed)
CSN: [id number]     Arrival date & time 02/06/13  0532 History     First MD Initiated Contact with Patient 02/06/13 [phone]     Chief Complaint  Patient presents with  . Shortness of Breath   (Consider location/radiation/quality/duration/timing/severity/associated sxs/prior Treatment) HPI History provided by patient. Dyspnea onset yesterday and worsening today. Worse with exertion. Patient called EMS and reports symptomatic improvement with oxygen. No chest pain. No cough or fevers. She has had some strong smelling urine for the last few days but denies any dysuria, urgency or frequency. She has ongoing left arm pain that has been diagnosed as musculoskeletal and is worse with movement. Dyspnea is moderate in severity. No increased leg swelling. Past Medical History  Diagnosis Date  . Anemia   . Anxiety   . Arthritis   . GERD (gastroesophageal reflux disease)   . Hyperlipidemia   . Hypertension   . Vitamin D deficiency   . Colon polyp     2009 colonoscopy  . Hiatal hernia   . Diverticulosis   . Aortic insufficiency     mild  . Stroke 1975  . CKD (chronic kidney disease) stage 3, GFR 30-59 ml/min   . CHF (congestive heart failure)    Past Surgical History  Procedure Laterality Date  . Appendectomy    . Abdominal hysterectomy    . Artery biopsy Left 12/16/2012    Procedure: BIOPSY TEMPORAL ARTERY;  Surgeon: Pryor Ochoa, MD;  Location: [facility] OR;  Service: Vascular;  Laterality: Left;   Family History  Problem Relation Age of Onset  . Stroke Mother   . Hypertension Mother   . Stroke Father   . Hypertension Father   . Diabetes Sister    History  Substance Use Topics  . Smoking status: Former Smoker    Types: Cigarettes    Quit date: 07/01/1984  . Smokeless tobacco: Former Neurosurgeon  . Alcohol Use: No   OB History   Grav Para Term Preterm Abortions TAB SAB Ect Mult Living                 Review of Systems  Constitutional: Negative for fever and chills.  HENT: Negative  for neck pain and neck stiffness.   Eyes: Negative for visual disturbance.  Respiratory: Positive for shortness of breath.   Cardiovascular: Negative for chest pain.  Gastrointestinal: Negative for abdominal pain.  Genitourinary: Negative for dysuria.  Musculoskeletal: Negative for back pain.  Skin: Negative for rash.  Neurological: Negative for headaches.  All other systems reviewed and are negative.    Allergies  Review of patient's allergies indicates no known allergies.  Home Medications   Current Outpatient Rx  Name  Route  Sig  Dispense  Refill  . acetaminophen (TYLENOL) 325 MG tablet   Oral   Take 2 tablets (650 mg total) by mouth every 6 (six) hours as needed for pain.   30 tablet   0   . artificial tears (LACRILUBE) OINT ophthalmic ointment   Ophthalmic   Apply 1 application to eye at bedtime and may repeat dose one time if needed. A thin layer of ointment should be applied to the exposed eyelid, taking care not to touch tip of applicator to eyelid; if complaints of ocular dryness, notify MD   1 Tube   0   . aspirin EC 81 MG tablet   Oral   Take 81 mg by mouth daily.         . Ferrous Gluconate (  FERATE) 256 (28 FE) MG TABS   Oral   Take 256 mg by mouth daily.   30 tablet   3   . losartan (COZAAR) 50 MG tablet   Oral   Take 1 tablet (50 mg total) by mouth daily.   90 tablet   3   . metoprolol (TOPROL-XL) 200 MG 24 hr tablet   Oral   Take 0.5 tablets (100 mg total) by mouth daily.   30 tablet   6   . omeprazole (PRILOSEC) 20 MG capsule   Oral   Take 1 capsule (20 mg total) by mouth daily. Will sometimes take twice daily if heartburn increases.   30 capsule   11   . predniSONE (DELTASONE) 5 MG tablet   Oral   Take 7 tablets (35 mg total) by mouth daily with breakfast. Taper by 5mg  per week for 7 weeks   196 tablet   0    BP 169/60  Pulse 87  Temp(Src) 99.3 F (37.4 C) (Oral)  Resp 17  SpO2 100% Physical Exam  Constitutional: She is  oriented to person, place, and time. She appears well-developed and well-nourished.  HENT:  Head: Normocephalic and atraumatic.  Eyes: EOM are normal. Pupils are equal, round, and reactive to light.  Neck: Neck supple.  Cardiovascular: Normal rate, regular rhythm and intact distal pulses.   Pulmonary/Chest: No stridor.  Decreased bilateral breath sounds without respiratory distress  Abdominal: Soft. She exhibits no distension. There is no tenderness.  Musculoskeletal: Normal range of motion. She exhibits no tenderness.  Neurological: She is alert and oriented to person, place, and time.  Skin: Skin is warm and dry.    ED Course   Procedures (including critical care time)  Results for orders placed during the hospital encounter of 02/06/13  CBC      Result Value Range   WBC 5.1  4.0 - 10.5 K/uL   RBC 3.20 (*) 3.87 - 5.11 MIL/uL   Hemoglobin 10.1 (*) 12.0 - 15.0 g/dL   HCT 16.1 (*) 09.6 - 04.5 %   MCV 93.4  78.0 - 100.0 fL   MCH 31.6  26.0 - 34.0 pg   MCHC 33.8  30.0 - 36.0 g/dL   RDW 40.9  81.1 - 91.4 %   Platelets 181  150 - 400 K/uL  COMPREHENSIVE METABOLIC PANEL      Result Value Range   Sodium 141  135 - 145 mEq/L   Potassium 3.7  3.5 - 5.1 mEq/L   Chloride 107  96 - 112 mEq/L   CO2 22  19 - 32 mEq/L   Glucose, Bld 89  70 - 99 mg/dL   BUN 18  6 - 23 mg/dL   Creatinine, Ser 7.82  0.50 - 1.10 mg/dL   Calcium 9.1  8.4 - 95.6 mg/dL   Total Protein 6.2  6.0 - 8.3 g/dL   Albumin 3.2 (*) 3.5 - 5.2 g/dL   AST 15  0 - 37 U/L   ALT 10  0 - 35 U/L   Alkaline Phosphatase 64  39 - 117 U/L   Total Bilirubin 0.5  0.3 - 1.2 mg/dL   GFR calc non Af Amer 54 (*) >90 mL/min   GFR calc Af Amer 63 (*) >90 mL/min  URINALYSIS, ROUTINE W REFLEX MICROSCOPIC      Result Value Range   Color, Urine YELLOW  YELLOW   APPearance CLOUDY (*) CLEAR   Specific Gravity, Urine 1.012  1.005 -  1.030   pH 6.0  5.0 - 8.0   Glucose, UA NEGATIVE  NEGATIVE mg/dL   Hgb urine dipstick TRACE (*) NEGATIVE    Bilirubin Urine NEGATIVE  NEGATIVE   Ketones, ur NEGATIVE  NEGATIVE mg/dL   Protein, ur 621 (*) NEGATIVE mg/dL   Urobilinogen, UA 0.2  0.0 - 1.0 mg/dL   Nitrite POSITIVE (*) NEGATIVE   Leukocytes, UA SMALL (*) NEGATIVE  URINE MICROSCOPIC-ADD ON      Result Value Range   Squamous Epithelial / LPF FEW (*) RARE   WBC, UA TOO NUMEROUS TO COUNT  <3 WBC/hpf   RBC / HPF 0-2  <3 RBC/hpf   Bacteria, UA MANY (*) RARE  POCT I-STAT, CHEM 8      Result Value Range   Sodium 144  135 - 145 mEq/L   Potassium 3.6  3.5 - 5.1 mEq/L   Chloride 110  96 - 112 mEq/L   BUN 16  6 - 23 mg/dL   Creatinine, Ser 3.08  0.50 - 1.10 mg/dL   Glucose, Bld 87  70 - 99 mg/dL   Calcium, Ion 6.57  8.46 - 1.30 mmol/L   TCO2 21  0 - 100 mmol/L   Hemoglobin 9.9 (*) 12.0 - 15.0 g/dL   HCT 96.2 (*) 95.2 - 84.1 %  POCT I-STAT TROPONIN I      Result Value Range   Troponin i, poc 0.03  0.00 - 0.08 ng/mL   Comment 3            Dg Chest Portable 1 View  02/06/2013   *RADIOLOGY REPORT*  Clinical Data: Shortness of breath.  PORTABLE CHEST - 1 VIEW  Comparison: Chest radiograph performed 01/15/2013  Findings: The lungs are well-aerated.  Mild vascular congestion is seen.  There is no evidence of focal opacification, pleural effusion or pneumothorax.  The cardiomediastinal silhouette is borderline normal in size; calcification is noted in the aortic arch.  No acute osseous abnormalities are seen.  IMPRESSION: Mild vascular congestion seen; lungs remain grossly clear.   Original Report Authenticated By: Tonia Ghent, M.D.   Dg Chest Port 1 View  01/15/2013   *RADIOLOGY REPORT*  Clinical Data: Intermittent chest pain  PORTABLE CHEST - 1 VIEW  Comparison: 01/01/2013  Findings: Mild enlargement of the cardiomediastinal silhouette is noted with central vascular congestion but no evidence for overt edema.  No new focal pulmonary opacity.  No pleural effusion. Bilateral AC joint degenerative change.  IMPRESSION: Cardiomegaly, no focal  acute finding.   Original Report Authenticated By: Christiana Pellant, M.D.     Date: 02/06/2013  Rate: 87  Rhythm: normal sinus rhythm  QRS Axis: normal  Intervals: normal  ST/T Wave abnormalities: nonspecific ST changes  Conduction Disutrbances:none  Narrative Interpretation:   Old EKG Reviewed: unchanged  IV Rocephin. IV Lasix.  7:36 AM discussed with internal medicine resident, will evaluate for admission  MDM  Dyspnea and mild CHF on chest x-ray reviewed as above, IV Lasix  Strong smelling urine with UTI, given IV antibiotics  EKG and labs reviewed as above  Medical admission    Sunnie Nielsen, MD 02/06/13 [phone]

## 2013-02-07 LAB — BASIC METABOLIC PANEL
BUN: 19 mg/dL (ref 6–23)
Creatinine, Ser: 1.25 mg/dL — ABNORMAL HIGH (ref 0.50–1.10)
GFR calc Af Amer: 45 mL/min — ABNORMAL LOW (ref >90)
GFR calc non Af Amer: 39 mL/min — ABNORMAL LOW (ref >90)

## 2013-02-07 LAB — CBC
MCHC: 33.7 g/dL (ref 30.0–36.0)
Platelets: 184 10*3/uL (ref 150–400)
RDW: 14.7 % (ref 11.5–15.5)
WBC: 5.6 10*3/uL (ref 4.0–10.5)

## 2013-02-07 MED ORDER — POTASSIUM CHLORIDE CRYS ER 20 MEQ PO TBCR
40.0000 meq | EXTENDED_RELEASE_TABLET | Freq: Once | ORAL | Status: AC
  Administered 2013-02-07: 40 meq via ORAL

## 2013-02-07 MED ORDER — CIPROFLOXACIN HCL 500 MG PO TABS
500.0000 mg | ORAL_TABLET | Freq: Two times a day (BID) | ORAL | Status: DC
  Administered 2013-02-07 – 2013-02-08 (×2): 500 mg via ORAL
  Filled 2013-02-07 (×7): qty 1

## 2013-02-07 MED ORDER — FUROSEMIDE 10 MG/ML IJ SOLN
40.0000 mg | Freq: Two times a day (BID) | INTRAMUSCULAR | Status: DC
  Administered 2013-02-07 – 2013-02-08 (×2): 40 mg via INTRAVENOUS
  Filled 2013-02-07 (×4): qty 4

## 2013-02-07 NOTE — Progress Notes (Signed)
Pt in bed O4x. No complaints of SOB or CP. PT states she feels her HR is high. Hr on epic range 77-102. Will continue to monitor PT

## 2013-02-07 NOTE — H&P (Signed)
INTERNAL MEDICINE TEACHING SERVICE Attending Admission Note  Date: 02/07/2013  Patient name: Jane [name]  Medical record number: [id number]  Date of birth: July 21, 1928    I have seen and evaluated [name] and discussed their care with the Residency Team.  Patient seen and examined. [name] is an 79 yr old woman with HFpEF, HTN, CKD stage 3, hx CVA, presented with SOB and "smelly urine".  She was recently admitted and started on Lasix PO. She followed up in clinic and lasix was stopped due to concern for AKI.  She was told to resume lasix if she developed worsening SOB.   She states she recently noticed increasing SOB with exertion, but yesterday is became much worse.  She admits to increased LE swelling.  In the ED she was found to have a CXR with evidence of congestion and given her hx, she was given 20 mg IV lasix. Her UA was also foul smelling and had evidence of pyuria.   This morning she feels slightly better, but states she still has significant SOB on exertion. I would start Lasix 40 mg IV bid today and tomorrow back off if she improves. I would change her Abx to PO at this time (avoid giving her more IV NS in any form).  She will likely improve rapidly as she did in prior hospitalizations.  [name], DO 8/10/20142:46 PM

## 2013-02-07 NOTE — Progress Notes (Signed)
I agree with the A/P by Student Doctor Jones. 

## 2013-02-07 NOTE — Progress Notes (Signed)
UR Completed.  Cassie Henkels Jane [phone] 02/07/2013  
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
## 2013-02-07 NOTE — Progress Notes (Addendum)
Pt has mild dyspnea with execration, Pt could benefit form a Pt elv.

## 2013-02-07 NOTE — Progress Notes (Signed)
Subjective: Jane [name] was seen and examined by me this AM.  She is feeling better and her SOB has improved.    Objective: Vital signs in last 24 hours: Filed Vitals:   02/06/13 2020 02/07/13 0122 02/07/13 0451 02/07/13 0858  BP: 143/60 152/64 145/68 129/51  Pulse: 86 94 94 102  Temp: 98.6 F (37 C)  99.2 F (37.3 C) 98.4 F (36.9 C)  TempSrc: Oral  Oral Oral  Resp: 18 18 18 18   Height:      Weight:   80.015 kg (176 lb 6.4 oz)   SpO2: 98% 98% 98% 97%   Weight change:   Intake/Output Summary (Last 24 hours) at 02/07/13 1356 Last data filed at 02/07/13 1200  Gross per 24 hour  Intake    890 ml  Output   1200 ml  Net   -310 ml   General: resting in bed in NAD Cardiac: irregular rhythm, no rubs, murmurs or gallops Pulm: clear to auscultation bilaterally, moving normal volumes of air Abd: soft, nontender, nondistended, BS present Ext: warm and well perfused, no pedal edema Neuro: alert and oriented X3  Lab Results: Basic Metabolic Panel:  Recent Labs Lab 02/06/13 0542 02/06/13 0557 02/06/13 1218 02/07/13 0530  NA 141 144  --  143  K 3.7 3.6  --  3.7  CL 107 110  --  105  CO2 22  --   --  26  GLUCOSE 89 87  --  100*  BUN 18 16  --  19  CREATININE 0.95 1.00  --  1.25*  CALCIUM 9.1  --   --  9.2  MG  --   --  1.5  --    Liver Function Tests:  Recent Labs Lab 02/06/13 0542  AST 15  ALT 10  ALKPHOS 64  BILITOT 0.5  PROT 6.2  ALBUMIN 3.2*    CBC:  Recent Labs Lab 02/06/13 0542 02/06/13 0557 02/07/13 0530  WBC 5.1  --  5.6  HGB 10.1* 9.9* 10.1*  HCT 29.9* 29.0* 30.0*  MCV 93.4  --  94.0  PLT 181  --  184   Cardiac Enzymes:  Recent Labs Lab 02/06/13 1218  TROPONINI <0.30   BNP:  Recent Labs Lab 02/06/13 0706  PROBNP 752.6*   D-Dimer:  Recent Labs Lab 02/06/13 0701  DDIMER 3.58*   Coagulation:  Recent Labs Lab 02/06/13 1218  LABPROT 13.4  INR 1.04   Urinalysis:  Recent Labs Lab 02/06/13 0634  COLORURINE YELLOW    LABSPEC 1.012  PHURINE 6.0  GLUCOSEU NEGATIVE  HGBUR TRACE*  BILIRUBINUR NEGATIVE  KETONESUR NEGATIVE  PROTEINUR 100*  UROBILINOGEN 0.2  NITRITE POSITIVE*  LEUKOCYTESUR SMALL*    Micro Results: Recent Results (from the past 240 hour(s))  URINE CULTURE     Status: None   Collection Time    02/06/13  6:34 AM      Result Value Range Status   Specimen Description URINE, CLEAN CATCH   Final   Special Requests NONE   Final   Culture  Setup Time     Final   Value: 02/06/2013 12:55     Performed at [facility] Count     Final   Value: >=100,000 COLONIES/ML     Performed at [facility]   Culture     Final   Value: ESCHERICHIA COLI     Performed at [facility]   Report Status PENDING   Incomplete  Studies/Results: Dg Chest Portable 1 View  02/06/2013   *RADIOLOGY REPORT*  Clinical Data: Shortness of breath.  PORTABLE CHEST - 1 VIEW  Comparison: Chest radiograph performed 01/15/2013  Findings: The lungs are well-aerated.  Mild vascular congestion is seen.  There is no evidence of focal opacification, pleural effusion or pneumothorax.  The cardiomediastinal silhouette is borderline normal in size; calcification is noted in the aortic arch.  No acute osseous abnormalities are seen.  IMPRESSION: Mild vascular congestion seen; lungs remain grossly clear.   Original Report Authenticated By: Tonia Ghent, M.D.   Medications: I have reviewed the patient's current medications. Scheduled Meds: . artificial tears  1 application Both Eyes QHS,MR X 1  . aspirin EC  81 mg Oral Daily  . cefTRIAXone (ROCEPHIN)  IV  1 g Intravenous Q24H  . ferrous gluconate  324 mg Oral Q breakfast  . furosemide  40 mg Oral Daily  . heparin  5,000 Units Subcutaneous Q8H  . losartan  50 mg Oral Daily  . metoprolol  100 mg Oral Daily  . pantoprazole  40 mg Oral Daily  . predniSONE  10 mg Oral Q breakfast  . sodium chloride  3 mL Intravenous Q12H   Continuous Infusions:  PRN  Meds:.sodium chloride, acetaminophen, sodium chloride  Assessment/Plan: 79 yo female with a PMH of HF with PEF, HTN, aortic insufficiency, CVA, CKD3 presenting with SOB and foul smelling urine.  #SOB 2/2 CHF exacerbation - Lasix 20mg  BID was started after diagnosis of HF with preserved EF (06/29-07/01/14 admision) found on Echo. However, Lasix d/c at 01/14/13 clinic visit due to increased Cr. Pt not overtly volume overloaded today but has some lower extremity edema, CXR with mild vascular congestion and Pro-BNP elevated at 752.6. PE less likely as pt without pleuritic CP, not tachycardiac, no recent travel history or known malignancy (Wells score 3 for R > L leg edema). RLE venous duplex negative for DVT. ACS unlikely as pt without CP, Troponin neg and no EKG changes.  - continue telemetry monitoring  - increase 40mg  IV Lasix daily --> BID, then reevaluate tomorrow - monitor Cr - continue oxygen at 2L via [place], keep SpO2 > 92%  - monitor I/Os and daily weights   #UTI - pt with complaint of foul smelling urine and UA+. 1g IV Rocephin given in the ED.  - change 1g IV Rocephin daily --> po Cipro 500mg  BID x 7days - urine culture pending  #Hypertension - on losartan 50mg  daily and Toprol XL 100mg  daily at home  - continue home meds   #irregular HR - repeat EKG with sinus arrhythmia, will monitor  #presumed temporal arteritis, biopsy ultimately negative - complete Prednisone taper, currently 10mg  daily until 02/08/13 then stop   #GERD - Protonix   Dispo: Disposition is deferred at this time, awaiting improvement of current medical problems.  Anticipated discharge in approximately 1-2 day(s).   The patient does have a current PCP Ula Lingo Montey Hora, MD) and does need an [facility] hospital follow-up appointment after discharge.  The patient does not know have transportation limitations that hinder transportation to clinic appointments.  .Services Needed at time of discharge: Y = Yes, Blank =  No PT:   OT:   RN:   Equipment:   Other:     LOS: 1 day   [name], DO 02/07/2013, 1:56 PM

## 2013-02-07 NOTE — Progress Notes (Signed)
I agree with the findings and plan by Student Doctor Jones. 

## 2013-02-08 ENCOUNTER — Encounter: Payer: Medicare Other | Admitting: Internal Medicine

## 2013-02-08 DIAGNOSIS — N179 Acute kidney failure, unspecified: Secondary | ICD-10-CM | POA: Diagnosis not present

## 2013-02-08 DIAGNOSIS — N390 Urinary tract infection, site not specified: Secondary | ICD-10-CM | POA: Diagnosis not present

## 2013-02-08 DIAGNOSIS — R0989 Other specified symptoms and signs involving the circulatory and respiratory systems: Secondary | ICD-10-CM | POA: Diagnosis not present

## 2013-02-08 DIAGNOSIS — I509 Heart failure, unspecified: Secondary | ICD-10-CM | POA: Diagnosis not present

## 2013-02-08 LAB — BASIC METABOLIC PANEL
Chloride: 102 mEq/L (ref 96–112)
GFR calc non Af Amer: 33 mL/min — ABNORMAL LOW (ref >90)
Glucose, Bld: 96 mg/dL (ref 70–99)
Potassium: 3.6 mEq/L (ref 3.5–5.1)
Sodium: 142 mEq/L (ref 135–145)

## 2013-02-08 LAB — CBC WITH DIFFERENTIAL/PLATELET
Eosinophils Absolute: 0 10*3/uL (ref 0.0–0.7)
Hemoglobin: 10.4 g/dL — ABNORMAL LOW (ref 12.0–15.0)
Lymphocytes Relative: 36 % (ref 12–46)
Lymphs Abs: 2.5 10*3/uL (ref 0.7–4.0)
Neutro Abs: 3.9 10*3/uL (ref 1.7–7.7)
Neutrophils Relative %: 57 % (ref 43–77)
Platelets: 191 10*3/uL (ref 150–400)
RBC: 3.31 MIL/uL — ABNORMAL LOW (ref 3.87–5.11)
WBC: 6.8 10*3/uL (ref 4.0–10.5)

## 2013-02-08 LAB — URINE CULTURE

## 2013-02-08 MED ORDER — FUROSEMIDE 10 MG/ML IJ SOLN
40.0000 mg | Freq: Every day | INTRAMUSCULAR | Status: DC
  Administered 2013-02-09: 40 mg via INTRAVENOUS
  Filled 2013-02-08: qty 4

## 2013-02-08 MED ORDER — NITROFURANTOIN MONOHYD MACRO 100 MG PO CAPS: 100.0000 mg | ORAL_CAPSULE | Freq: Two times a day (BID) | ORAL | Status: DC

## 2013-02-08 MED ORDER — NITROFURANTOIN MONOHYD MACRO 100 MG PO CAPS
100.0000 mg | ORAL_CAPSULE | Freq: Two times a day (BID) | ORAL | Status: DC
  Administered 2013-02-08 – 2013-02-09 (×3): 100 mg via ORAL
  Filled 2013-02-08 (×5): qty 1

## 2013-02-08 MED ORDER — CEPHALEXIN 500 MG PO CAPS
500.0000 mg | ORAL_CAPSULE | Freq: Two times a day (BID) | ORAL | Status: DC
  Administered 2013-02-08 – 2013-02-10 (×4): 500 mg via ORAL
  Filled 2013-02-08 (×6): qty 1

## 2013-02-08 MED ORDER — MAGNESIUM SULFATE 40 MG/ML IJ SOLN
2.0000 g | Freq: Once | INTRAMUSCULAR | Status: AC
  Administered 2013-02-08: 2 g via INTRAVENOUS
  Filled 2013-02-08: qty 50
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
## 2013-02-08 NOTE — Progress Notes (Signed)
Subjective: Jane [name] was seen and examined by me this AM.  She feels better but still experiences some dyspnea.  She denies CP, fever or dysuria.  Objective: Vital signs in last 24 hours: Filed Vitals:   02/08/13 0446 02/08/13 0451 02/08/13 0900 02/08/13 1402  BP: 134/53  130/58 105/45  Pulse: 100  95 91  Temp:    98.9 F (37.2 C)  TempSrc:    Oral  Resp: 18   20  Height:      Weight: 78.971 kg (174 lb 1.6 oz) 78.971 kg (174 lb 1.6 oz)    SpO2: 96%   95%   Weight change: -1.629 kg (-3 lb 9.5 oz)  Intake/Output Summary (Last 24 hours) at 02/08/13 2019 Last data filed at 02/08/13 1817  Gross per 24 hour  Intake   1133 ml  Output   1651 ml  Net   -518 ml   General: sitting up in chair in NAD HEENT:  Mucous membranes moist Cardiac: RRR, no rubs, murmurs or gallops Pulm: clear to auscultation bilaterally, moving normal volumes of air Abd: soft, nontender, nondistended, BS present, + mild suprapubic tenderness Ext: warm and well perfused, no pedal edema Neuro: alert and oriented X3  Lab Results: Basic Metabolic Panel:  Recent Labs Lab 02/06/13 1218 02/07/13 0530 02/08/13 0535  NA  --  143 142  K  --  3.7 3.6  CL  --  105 102  CO2  --  26 25  GLUCOSE  --  100* 96  BUN  --  19 25*  CREATININE  --  1.25* 1.45*  CALCIUM  --  9.2 9.3  MG 1.5  --  1.4*   Liver Function Tests:  Recent Labs Lab 02/06/13 0542  AST 15  ALT 10  ALKPHOS 64  BILITOT 0.5  PROT 6.2  ALBUMIN 3.2*   CBC:  Recent Labs Lab 02/07/13 0530 02/08/13 0535  WBC 5.6 6.8  NEUTROABS  --  3.9  HGB 10.1* 10.4*  HCT 30.0* 31.1*  MCV 94.0 94.0  PLT 184 191   Cardiac Enzymes:  Recent Labs Lab 02/06/13 1218  TROPONINI <0.30   BNP:  Recent Labs Lab 02/06/13 0706  PROBNP 752.6*   D-Dimer:  Recent Labs Lab 02/06/13 0701  DDIMER 3.58*   Coagulation:  Recent Labs Lab 02/06/13 1218  LABPROT 13.4  INR 1.04   Urinalysis:  Recent Labs Lab 02/06/13 0634  COLORURINE  YELLOW  LABSPEC 1.012  PHURINE 6.0  GLUCOSEU NEGATIVE  HGBUR TRACE*  BILIRUBINUR NEGATIVE  KETONESUR NEGATIVE  PROTEINUR 100*  UROBILINOGEN 0.2  NITRITE POSITIVE*  LEUKOCYTESUR SMALL*    Micro Results: Recent Results (from the past 240 hour(s))  URINE CULTURE     Status: None   Collection Time    02/06/13  6:34 AM      Result Value Range Status   Specimen Description URINE, CLEAN CATCH   Final   Special Requests NONE   Final   Culture  Setup Time     Final   Value: 02/06/2013 12:55     Performed at [facility] Count     Final   Value: >=100,000 COLONIES/ML     Performed at [facility]   Culture     Final   Value: ESCHERICHIA COLI     Performed at [facility]   Report Status 02/08/2013 FINAL   Final   Organism ID, Bacteria ESCHERICHIA COLI   Final  Studies/Results: No results found. Medications: I have reviewed the patient's current medications. Scheduled Meds: . artificial tears  1 application Both Eyes QHS,MR X 1  . aspirin EC  81 mg Oral Daily  . cephALEXin  500 mg Oral Q12H  . ferrous gluconate  324 mg Oral Q breakfast  . [START ON 02/09/2013] furosemide  40 mg Intravenous Daily  . heparin  5,000 Units Subcutaneous Q8H  . losartan  50 mg Oral Daily  . metoprolol  100 mg Oral Daily  . nitrofurantoin (macrocrystal-monohydrate)  100 mg Oral Q12H  . pantoprazole  40 mg Oral Daily  . sodium chloride  3 mL Intravenous Q12H   Continuous Infusions:  PRN Meds:.sodium chloride, acetaminophen, sodium chloride  Assessment/Plan: 79 yo female with a PMH of HF with PEF, HTN, aortic insufficiency, CVA, CKD3 presenting with SOB and foul smelling urine.   #SOB 2/2 CHF exacerbation - patient dyspnea is improving and she is - 2L since admission.  Cr has bumped 1.04 on admission -->1.45 - continue telemetry monitoring  - decrease Lasix frequency from BID to 40mg  IV Lasix once daily - monitor Cr  - continue oxygen at 2L via [place], keep SpO2 >  92%  - monitor I/Os and daily weights   #UTI - pt with complaint of foul smelling urine and UA+. 1g IV Rocephin given in the ED.  - culture and sensitivity back, change po Cipro 500mg  BID --> Keflex 500mg  BID  #Hypertension - on losartan 50mg  daily and Toprol XL 100mg  daily at home  - continue home meds   #irregular HR - resolved, repeat EKG with sinus arrhythmia, will monitor   #presumed temporal arteritis, biopsy ultimately negative - complete Prednisone taper, currently 10mg  daily until 02/08/13 then stop   #GERD - Protonix   Dispo: Disposition is deferred at this time, awaiting improvement of current medical problems.  Anticipated discharge in approximately 1 day(s).   The patient does have a current PCP Ula Lingo Montey Hora, MD) and does need an [facility] hospital follow-up appointment after discharge.  The patient does not know have transportation limitations that hinder transportation to clinic appointments.  .Services Needed at time of discharge: Y = Yes, Blank = No PT:   OT:   RN:   Equipment:   Other:     LOS: 2 days   Jane Peat, DO 02/08/2013, 8:19 PM

## 2013-02-08 NOTE — Progress Notes (Signed)
  Date: 02/08/2013  Patient name: GRACIE GUPTA  Medical record number: [id number]  Date of birth: 1928-11-29   This patient has been seen and the plan of care was discussed with the house staff. Please see their note for complete details. I concur with their findings with the following additions/corrections:  Agree with plans as outlined by Dr Andrey Campanile and medical student Jerolyn Shin. Will follow up sensitivities of ecoli UTI to determine what is oral antibiotic to transition her to for completion of treatment. Will decrease diuretics to minimize aki and contraction alkalosis. Replete electrolytes.  Judyann Munson, MD 02/08/2013, 5:30 PM

## 2013-02-08 NOTE — Progress Notes (Signed)
Patient evaluated for community based chronic disease management services with [facility] Care Management Program as a benefit of patient's [employer]. Patient will receive a post discharge transition of care call and will be evaluated for monthly home visits for assessments and disease process education. Spoke with patient at bedside to explain [facility] Care Management services.  Written consents obtained.  Contact information verified.  Left contact information and [facility] literature at bedside. Made inpatient Case Manager aware that [facility] Care Management following. Of note, [facility] Care Management services does not replace or interfere with any services that are arranged by inpatient case management or social work.  For additional questions or referrals please contact Anibal Henderson BSN RN [facility] Liaison at [phone].

## 2013-02-08 NOTE — Care Management Note (Signed)
    Page 1 of 2   02/08/2013     10:54:39 AM   CARE MANAGEMENT NOTE 02/08/2013  Patient:  Jane [name], Jane [name]   Account Number:  [id number]  Date Initiated:  02/08/2013  Documentation initiated by:  Oletta Cohn  Subjective/Objective Assessment:   79 yo female with a PMH of HF with PEF, HTN, CVA and CKD3 who presents with primary complaint of SOB// Home alone; has home health aide     Action/Plan:   diurese, home with Home Health   Anticipated DC Date:  02/11/2013   Anticipated DC Plan:  HOME W HOME HEALTH SERVICES      DC Planning Services  CM consult      [facility] Choice  HOME HEALTH  Resumption Of Svcs/PTA Mayzie Caughlin   Choice offered to / List presented to:  C-1 Patient        HH arranged  HH-1 RN  HH-10 DISEASE MANAGEMENT  HH-2 PT      HH agency  [facility]   Status of service:  Completed, signed off Medicare Important Message given?   (If response is "NO", the following Medicare IM given date fields will be blank) Date Medicare IM given:   Date Additional Medicare IM given:    Discharge Disposition:    Per UR Regulation:    If discussed at Long Length of Stay Meetings, dates discussed:    Comments:  02/08/13 1030.Marland KitchenMarland KitchenOletta Cohn, RN, BSN, [employer] [phone] Spoke with pt regarding discharge planning.  Pt currently receives Home Health services through [facility]. NCM confirmed with Hilda Lias of [facility] that pt currently active. Pt also stated that she needs help with transportation to and from MD appts.  NCM offered [facility] application- pt declines as she states that "[facility] will make you late for appts and won't pick you up on time."  Pt also has daughter-in-law who transports pt to appts and pt says she is not always availiable.   NCM will refer to [facility] for further assistance in this matter.

## 2013-02-08 NOTE — Progress Notes (Signed)
[facility]  Patient Status: Active (receiving services up to time of hospitalization)  [facility] is providing the following services: RN, PT and OT  If patient discharges after hours, please call [phone].   Jane [name] 02/08/2013, 11:24 AM

## 2013-02-09 LAB — CBC WITH DIFFERENTIAL/PLATELET
Eosinophils Relative: 0 % (ref 0–5)
Hemoglobin: 11.4 g/dL — ABNORMAL LOW (ref 12.0–15.0)
Lymphocytes Relative: 31 % (ref 12–46)
Lymphs Abs: 2.5 10*3/uL (ref 0.7–4.0)
MCV: 93.8 fL (ref 78.0–100.0)
Neutrophils Relative %: 60 % (ref 43–77)
Platelets: 181 10*3/uL (ref 150–400)
RBC: 3.56 MIL/uL — ABNORMAL LOW (ref 3.87–5.11)
WBC: 8.1 10*3/uL (ref 4.0–10.5)

## 2013-02-09 LAB — BASIC METABOLIC PANEL
BUN: 38 mg/dL — ABNORMAL HIGH (ref 6–23)
CO2: 25 mEq/L (ref 19–32)
Chloride: 99 mEq/L (ref 96–112)
Glucose, Bld: 108 mg/dL — ABNORMAL HIGH (ref 70–99)
Glucose, Bld: 116 mg/dL — ABNORMAL HIGH (ref 70–99)
Potassium: 3.7 mEq/L (ref 3.5–5.1)
Potassium: 3.6 mEq/L (ref 3.5–5.1)
Sodium: 140 mEq/L (ref 135–145)

## 2013-02-09 MED ORDER — SODIUM CHLORIDE 0.9 % IV BOLUS (SEPSIS)
500.0000 mL | Freq: Once | INTRAVENOUS | Status: AC
  Administered 2013-02-09: 500 mL via INTRAVENOUS
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
## 2013-02-09 NOTE — Telephone Encounter (Signed)
PT informed

## 2013-02-09 NOTE — Progress Notes (Addendum)
  Date: 02/09/2013  Patient name: Jane [name]  Medical record number: [id number]  Date of birth: 1929/04/26   This patient has been seen and the plan of care was discussed with the house staff. Please see their note for complete details. I concur with their findings with the following additions/corrections:  I agree with plan as detailed by Dr. Andrey Campanile and medical student eric jones. Patient is improving from her initial presentation of her ecoli UTI. Now on cephalexin. Will see if patient needs PT home services. Patient has mild acute on chronic kidney injury for which we will bolus to see if she has likely been overdiuresed/contraction alkalosis in response to treatment of CHF.  Judyann Munson, MD 02/09/2013, 7:16 PM

## 2013-02-09 NOTE — Progress Notes (Addendum)
Subjective: Jane [name] was seen and examined by me this AM.  Her dyspnea has improved since admission.  Her appetite is good and she has no chest pain.    Objective: Vital signs in last 24 hours: Filed Vitals:   02/09/13 0524 02/09/13 0556 02/09/13 1003 02/09/13 1300  BP:  109/51 128/58 101/77  Pulse:  79 81 91  Temp:  98.4 F (36.9 C)  98.1 F (36.7 C)  TempSrc:  Oral  Oral  Resp:  18  18  Height:      Weight: 78.654 kg (173 lb 6.4 oz)     SpO2:  95%  95%   Weight change: -0.318 kg (-11.2 oz)  Intake/Output Summary (Last 24 hours) at 02/09/13 1451 Last data filed at 02/09/13 1309  Gross per 24 hour  Intake    710 ml  Output   1201 ml  Net   -491 ml   General: sitting up in chair in NAD HEENT:  Mucous membranes moist Cardiac: RRR, no rubs, murmurs or gallops Pulm: clear to auscultation bilaterally, moving normal volumes of air Abd: soft, nontender, nondistended, BS present Ext: no lower extremity edema Neuro: alert and oriented X3  Lab Results: Basic Metabolic Panel:  Recent Labs Lab 02/06/13 1218  02/08/13 0535 02/09/13 0800  NA  --   < > 142 140  K  --   < > 3.6 3.7  CL  --   < > 102 100  CO2  --   < > 25 25  GLUCOSE  --   < > 96 108*  BUN  --   < > 25* 39*  CREATININE  --   < > 1.45* 1.81*  CALCIUM  --   < > 9.3 9.0  MG 1.5  --  1.4*  --   < > = values in this interval not displayed. Liver Function Tests:  Recent Labs Lab 02/06/13 0542  AST 15  ALT 10  ALKPHOS 64  BILITOT 0.5  PROT 6.2  ALBUMIN 3.2*   CBC:  Recent Labs Lab 02/08/13 0535 02/09/13 0800  WBC 6.8 8.1  NEUTROABS 3.9 4.9  HGB 10.4* 11.4*  HCT 31.1* 33.4*  MCV 94.0 93.8  PLT 191 181   Cardiac Enzymes:  Recent Labs Lab 02/06/13 1218  TROPONINI <0.30   BNP:  Recent Labs Lab 02/06/13 0706  PROBNP 752.6*   D-Dimer:  Recent Labs Lab 02/06/13 0701  DDIMER 3.58*   Coagulation:  Recent Labs Lab 02/06/13 1218  LABPROT 13.4  INR 1.04    Urinalysis:  Recent Labs Lab 02/06/13 0634  COLORURINE YELLOW  LABSPEC 1.012  PHURINE 6.0  GLUCOSEU NEGATIVE  HGBUR TRACE*  BILIRUBINUR NEGATIVE  KETONESUR NEGATIVE  PROTEINUR 100*  UROBILINOGEN 0.2  NITRITE POSITIVE*  LEUKOCYTESUR SMALL*   Micro Results: Recent Results (from the past 240 hour(s))  URINE CULTURE     Status: None   Collection Time    02/06/13  6:34 AM      Result Value Range Status   Specimen Description URINE, CLEAN CATCH   Final   Special Requests NONE   Final   Culture  Setup Time     Final   Value: 02/06/2013 12:55     Performed at [facility] Count     Final   Value: >=100,000 COLONIES/ML     Performed at [facility]   Culture     Final   Value: ESCHERICHIA COLI  Performed at [facility]   Report Status 02/08/2013 FINAL   Final   Organism ID, Bacteria ESCHERICHIA COLI   Final   Studies/Results: No results found. Medications: I have reviewed the patient's current medications. Scheduled Meds: . artificial tears  1 application Both Eyes QHS,MR X 1  . aspirin EC  81 mg Oral Daily  . cephALEXin  500 mg Oral Q12H  . ferrous gluconate  324 mg Oral Q breakfast  . heparin  5,000 Units Subcutaneous Q8H  . losartan  50 mg Oral Daily  . metoprolol  100 mg Oral Daily  . nitrofurantoin (macrocrystal-monohydrate)  100 mg Oral Q12H  . pantoprazole  40 mg Oral Daily  . sodium chloride  3 mL Intravenous Q12H   Continuous Infusions:  PRN Meds:.sodium chloride, acetaminophen, sodium chloride  Assessment/Plan: 79 yo female with a PMH of HF with PEF, HTN, aortic insufficiency, CVA, CKD3 presenting with SOB and foul smelling urine.   #SOB 2/2 CHF exacerbation - patient dyspnea is improving and she is - 2L since admission. Her dyspnea has resolved.  Cr has bumped 1.04 on admission -->1.84 - d/c Lasix - 500cc of NS over 3 hours - AM BMP  #UTI - pt with complaint of foul smelling urine and UA+ - Keflex 500mg  BID    #Hypertension - on losartan 50mg  daily and Toprol XL 100mg  daily at home  - continue home meds   #irregular HR - resolved   #presumed temporal arteritis, biopsy ultimately negative - Prednisone taper complete on 02/08/13  #GERD - Protonix  Dispo: Disposition is deferred at this time, awaiting improvement of current medical problems.  Anticipated discharge in approximately 1 day(s).   The patient does have a current PCP Ula Lingo Montey Hora, MD) and does need an [facility] hospital follow-up appointment after discharge.  The patient does have transportation limitations that hinder transportation to clinic appointments.  .Services Needed at time of discharge: Y = Yes, Blank = No PT:   OT:   RN:   Equipment:   Other:     LOS: 3 days   [name], DO 02/09/2013, 2:51 PM A/P:  AKI on CKD:  Pt Cr has bumped from 1.00--> 1.84 - d/c Lasix - 500cc of NS over 3 hours - AM BMP  [name]

## 2013-02-09 NOTE — Progress Notes (Signed)
Patient ID: Jane [name], female   DOB: Nov 17, 1928, 79 y.o.   MRN: [id number]   Subjective: No acute events overnight. This morning she tells me that her shortness of breath is "about the same" since admission. We also talked about how she has been having increased stress with her family, particularly with nobody coming to visit in the hospital and her sister considering a move (Jane [name]'s sister currently lives on the same floor of their building). No chest pain and no dysuria today. She still has the musculoskeletal pain in her left arm.  Objective: Vital signs in last 24 hours: Filed Vitals:   02/09/13 0524 02/09/13 0556 02/09/13 1003 02/09/13 1300  BP:  109/51 128/58 101/77  Pulse:  79 81 91  Temp:  98.4 F (36.9 C)  98.1 F (36.7 C)  TempSrc:  Oral  Oral  Resp:  18  18  Height:      Weight: 78.654 kg (173 lb 6.4 oz)     SpO2:  95%  95%   Weight change: -0.318 kg (-11.2 oz)  Intake/Output Summary (Last 24 hours) at 02/09/13 1324 Last data filed at 02/09/13 1309  Gross per 24 hour  Intake    950 ml  Output   1451 ml  Net   -501 ml   Physical Exam  Vitals reviewed. Constitutional: No distress.  HENT:  Head: Normocephalic and atraumatic.  Eyes:  Everted left eyelid and ptosis on the right  Neck: No JVD present.  Cardiovascular: Normal rate.  An irregular rhythm present.  No murmur heard. Pulmonary/Chest: Effort normal and breath sounds normal. She has no rales.  Abdominal: Soft. She exhibits no distension. There is tenderness (mild suprapubic tenderness). There is no rebound and no guarding.  Musculoskeletal: She exhibits no edema.  Neurological: She is alert.  Psychiatric: She has a normal mood and affect.    Lab Results: CBC    Component Value Date/Time   WBC 8.1 02/09/2013 0800   RBC 3.56* 02/09/2013 0800   HGB 11.4* 02/09/2013 0800   HCT 33.4* 02/09/2013 0800   PLT 181 02/09/2013 0800   MCV 93.8 02/09/2013 0800   MCH 32.0 02/09/2013 0800   MCHC 34.1  02/09/2013 0800   RDW 14.5 02/09/2013 0800   LYMPHSABS 2.5 02/09/2013 0800   MONOABS 0.7 02/09/2013 0800   EOSABS 0.0 02/09/2013 0800   BASOSABS 0.0 02/09/2013 0800    CMP     Component Value Date/Time   NA 140 02/09/2013 0800   K 3.7 02/09/2013 0800   CL 100 02/09/2013 0800   CO2 25 02/09/2013 0800   GLUCOSE 108* 02/09/2013 0800   BUN 39* 02/09/2013 0800   CREATININE 1.81* 02/09/2013 0800   CREATININE 1.41* 01/11/2013 1557   CALCIUM 9.0 02/09/2013 0800   PROT 6.2 02/06/2013 0542   ALBUMIN 3.2* 02/06/2013 0542   AST 15 02/06/2013 0542   ALT 10 02/06/2013 0542   ALKPHOS 64 02/06/2013 0542   BILITOT 0.5 02/06/2013 0542   GFRNONAA 25* 02/09/2013 0800   GFRAA 29* 02/09/2013 0800     Micro Results: Recent Results (from the past 240 hour(s))  URINE CULTURE     Status: None   Collection Time    02/06/13  6:34 AM      Result Value Range Status   Specimen Description URINE, CLEAN CATCH   Final   Special Requests NONE   Final   Culture  Setup Time     Final   Value: 02/06/2013 12:55  Performed at [facility] Count     Final   Value: >=100,000 COLONIES/ML     Performed at [facility]   Culture     Final   Value: ESCHERICHIA COLI     Performed at [facility]   Report Status 02/08/2013 FINAL   Final   Organism ID, Bacteria ESCHERICHIA COLI   Final   Studies/Results: No results found. Medications: I have reviewed the patient's current medications. Scheduled Meds:  artificial tears  1 application Both Eyes QHS,MR X 1   aspirin EC  81 mg Oral Daily   cephALEXin  500 mg Oral Q12H   ferrous gluconate  324 mg Oral Q breakfast   heparin  5,000 Units Subcutaneous Q8H   losartan  50 mg Oral Daily   metoprolol  100 mg Oral Daily   nitrofurantoin (macrocrystal-monohydrate)  100 mg Oral Q12H   pantoprazole  40 mg Oral Daily   sodium chloride  3 mL Intravenous Q12H   Continuous Infusions:  PRN Meds:.sodium chloride, acetaminophen, sodium  chloride Assessment/Plan: Active Problems:   HYPERLIPIDEMIA   ANEMIA, IRON DEFICIENCY NOS   ANXIETY   HYPERTENSION   AORTIC REGURGITATION, MODERATE   C V A / STROKE   GERD   OSTEOARTHRITIS   CHF exacerbation  Jane [name] is an 79 year old woman with a PMHx of HFpEF (60-65%), HTN, hx stroke, AI, GERD, and stage 3 CKD who presents to the ED with shortness of breath and UTI.   **Shortness of Breath  Her creatinine is elevated to 1.81 from 1.45 today. She is now clinically euvolemic and her vitals are stable. Her current symptoms are most likely at her baseline. It seems that her family stress is probably contributing to her reluctance to going home.  -Discontinue furosemide -Monitor electrolytes and replete as needed  -Arrange follow up in our clinic to get BMP and monitor her creatinine.  -PT eval to determine if she needs home physical therapy -Arrange for transportation for her clinic visit **UTI  Urinalysis shows cloudy urine, positive for nitrites, leukocytes, and trace hemoglobin. She was started on ceftriaxone in the emergency department. She does not have a complicated UTI.  -Culture results showed sensitivity to cephalexin. We switched her medication from ciprofloxacin 500 mg BID to cephalexin 500 mg BID.  **HTN  Blood pressure has been elevated at this visit (130s-190s)/(50s-100s). It has improved over the past 24 hours (100s-120s)/(40s-50s).  -Continue home regimen of losartan 50 mg and metoprolol succinate 100 mg  **Anemia  Hemoglobin remains near baseline today. Her Hgb tends to run in the high 9s and mid 10s. Her CKD is likely contributory as well.  -Continue home ferrous gluconate  -Monitor CBC  **Prednisone Taper  Completed. **Sinus arrhythmia  Repeat EKG showed sinus arrhythmia. No history of atrial fibrillation and asymptomatic.  -Continue to monitor for any changes  **DVT PPx  -Heparin IV 5000 units daily  **FEN  -Heart healthy diet  **Dispo -Discharge  to home today   This is a Psychologist, occupational Note.  The care of the patient was discussed with Dr. Andrey Campanile and the assessment and plan formulated with their assistance.  Please see their attached note for official documentation of the daily encounter.   LOS: 3 days   Jane [name], MS3 02/09/2013, 1:24 PM

## 2013-02-09 NOTE — Clinical Documentation Improvement (Signed)
THIS DOCUMENT IS NOT A PERMANENT PART OF THE MEDICAL RECORD  Please update your documentation with the medical record to reflect your response to this query. If you need help knowing how to do this please call [phone]  02/09/13  Dr. Andrey Campanile,  In a better effort to capture your patient's severity of illness, reflect appropriate length of stay and utilization of resources, a review of the patient medical record has revealed the following information:    - Admitted with CHF Exacerbation   12/28/12 Echo  Study Conclusions - Left ventricle: The cavity size was normal. There was mild concentric hypertrophy. Systolic function was normal. The estimated ejection fraction was in the range of 60% to 65%. Wall motion was normal; there were no regional wall motion abnormalities. Doppler parameters are consistent with abnormal left ventricular relaxation (grade 1 diastolic dysfunction). - Aortic valve: Mild to moderate regurgitation directed eccentrically in the LVOT and along the septum. Impressions: - Compared to the prior study, there has been no significant interval change.   Based on your clinical judgment, please document the ACUITY and Type of Heart Failure monitored and treated this admission  ACUITY:  - Acute  - Chronic  - Acute on Chronic  AND  TYPE:  - Systolic  - Diastolic  - Combined   In responding to this query please exercise your independent judgment.   The fact that a query is asked, does not imply that any particular answer is desired or expected.   Reviewed: 02/16/13 - query never addressed.  Jane [name] is retroquery for a and t of chf. Mathis Dad RN  Thank You,  Jane Ralph  RN BSN CCDS Certified Clinical Documentation Specialist: [phone] Health Information Management [place]

## 2013-02-10 LAB — CBC WITH DIFFERENTIAL/PLATELET
Basophils Absolute: 0 10*3/uL (ref 0.0–0.1)
Basophils Relative: 0 % (ref 0–1)
Eosinophils Relative: 1 % (ref 0–5)
HCT: 32.3 % — ABNORMAL LOW (ref 36.0–46.0)
Lymphocytes Relative: 27 % (ref 12–46)
MCHC: 33.4 g/dL (ref 30.0–36.0)
MCV: 94.4 fL (ref 78.0–100.0)
Monocytes Absolute: 0.6 10*3/uL (ref 0.1–1.0)
RDW: 14.6 % (ref 11.5–15.5)

## 2013-02-10 LAB — BASIC METABOLIC PANEL
BUN: 43 mg/dL — ABNORMAL HIGH (ref 6–23)
CO2: 27 mEq/L (ref 19–32)
Chloride: 100 mEq/L (ref 96–112)
Creatinine, Ser: 1.74 mg/dL — ABNORMAL HIGH (ref 0.50–1.10)

## 2013-02-10 MED ORDER — CEPHALEXIN 500 MG PO CAPS: 500.0000 mg | ORAL_CAPSULE | Freq: Two times a day (BID) | ORAL | Status: DC

## 2013-02-10 NOTE — Progress Notes (Signed)
Pt setting in Chair. O4x, states she feels better than on admit. Pt states feeling a little SOB after walking with Pt.  Will continue to monitor Pt

## 2013-02-10 NOTE — Progress Notes (Signed)
  Date: 02/10/2013  Patient name: Jane [name]  Medical record number: [id number]  Date of birth: 03/09/1929   This patient has been seen and the plan of care was discussed with the house staff. Please see their note for complete details. I concur with their findings with the following additions/corrections:  Agree with the discharge plan as described by Dr. Andrey Campanile. Will arrange for home PT and have followup in the IM clinic next week where she will be accompanied by her family member to help with coordination of care.   Judyann Munson, MD 02/10/2013, 10:18 PM

## 2013-02-10 NOTE — Evaluation (Signed)
Occupational Therapy Evaluation Patient Details Name: Jane [name] MRN: [id number] DOB: 1929-04-17 Today's Date: 02/10/2013 Time: 0830-0900 OT Time Calculation (min): 30 min  OT Assessment / Plan / Recommendation History of present illness 79 yr old woman with HFpEF, HTN, CKD stage 3, hx CVA, presented with SOB and "smelly urine".  She was recently admitted and started on Lasix PO. She followed up in clinic and lasix was stopped due to concern for AKI.  She was told to resume lasix if she developed worsening SOB   Clinical Impression   Pt admitted with abve. Pt currently with functional limitations due to the deficits listed below (see OT Problem List).  Pt will benefit from skilled OT to increase their safety and independence with ADL and functional mobility for ADL to facilitate discharge to venue listed below.       OT Assessment  Patient needs continued OT Services    Follow Up Recommendations  Home health OT    Barriers to Discharge Decreased caregiver support    Equipment Recommendations  None recommended by OT       Frequency  Min 2X/week    Precautions / Restrictions Precautions Precautions: Fall Restrictions Weight Bearing Restrictions: No       ADL  Eating/Feeding: Independent;Simulated Where Assessed - Eating/Feeding: Chair Grooming: Set up;Supervision/safety;Simulated Where Assessed - Grooming: Unsupported standing Upper Body Bathing: Set up;Supervision/safety;Simulated Where Assessed - Upper Body Bathing: Unsupported sitting Lower Body Bathing: Simulated;Minimal assistance Where Assessed - Lower Body Bathing: Unsupported sit to stand Upper Body Dressing: Simulated;Set up;Supervision/safety Where Assessed - Upper Body Dressing: Unsupported sitting Lower Body Dressing: Simulated;Minimal assistance Where Assessed - Lower Body Dressing: Unsupported sit to stand Toilet Transfer: Min guard;Performed Toilet Transfer Method: Sit to Writer: Bedside commode Toileting - Clothing Manipulation and Hygiene: Performed;Min guard Where Assessed - Engineer, mining and Hygiene: Standing Equipment Used: Rolling walker;Gait belt Transfers/Ambulation Related to ADLs: Min guard A for all with RW    OT Diagnosis: Generalized weakness  OT Problem List: Decreased strength;Impaired balance (sitting and/or standing);Obesity OT Treatment Interventions: Self-care/ADL training;Balance training;DME and/or AE instruction;Patient/family education   OT Goals(Current goals can be found in the care plan section) Acute Rehab OT Goals Patient Stated Goal: home OT Goal Formulation: With patient Time For Goal Achievement: 02/17/13 Potential to Achieve Goals: Good  Visit Information  Last OT Received On: 02/10/13 Assistance Needed: +1 PT/OT Co-Evaluation/Treatment: Yes History of Present Illness: 79 yr old woman with HFpEF, HTN, CKD stage 3, hx CVA, presented with SOB and "smelly urine".  She was recently admitted and started on Lasix PO. She followed up in clinic and lasix was stopped due to concern for AKI.  She was told to resume lasix if she developed worsening SOB       Prior Functioning     Home Living Family/patient expects to be discharged to:: Private residence Living Arrangements: Alone Available Help at Discharge: Family;Available PRN/intermittently Type of Home: Apartment Home Access: Elevator Home Layout: One level Home Equipment: Cane - single point;Walker - 2 wheels;Grab bars - tub/shower;Grab bars - toilet Prior Function Comments: pt sponge bathes when alone, "furniture walks" Communication Communication: No difficulties Dominant Hand: Right         Vision/Perception Vision - History Baseline Vision: Wears glasses only for reading Patient Visual Report:  (Feels like she needs new glasses)   Cognition  Cognition Arousal/Alertness: Awake/alert Behavior During Therapy: WFL for tasks  assessed/performed Overall Cognitive Status: Within Functional Limits for tasks assessed  Extremity/Trunk Assessment Upper Extremity Assessment Upper Extremity Assessment: Generalized weakness Lower Extremity Assessment Lower Extremity Assessment: Generalized weakness Cervical / Trunk Assessment Cervical / Trunk Assessment:  (increased trunk flexion due to LBP)     Mobility Bed Mobility Bed Mobility: Not assessed (pt received up in chair) Transfers Sit to Stand: With upper extremity assist;4: Min guard;With armrests;From chair/3-in-1 Stand to Sit: 4: Min guard;With upper extremity assist;To chair/3-in-1 Details for Transfer Assistance: increased time           End of Session OT - End of Session Equipment Utilized During Treatment: Gait belt;Rolling walker Activity Tolerance: Patient tolerated treatment well Patient left: in chair;with call bell/phone within reach       Evette Georges [phone] 02/10/2013, 9:36 AM

## 2013-02-10 NOTE — Progress Notes (Signed)
Pt d/c to home. D/c instructions and medications reviewed with Pt . Pt states understanding. All Pt questions answered
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
## 2013-02-10 NOTE — Progress Notes (Signed)
Subjective: Jane [name] was seen and examined by me this AM.  Her dyspnea has improved and she denies CP.  She is ready for discharge this afternoon.  Objective: Vital signs in last 24 hours: Filed Vitals:   02/09/13 2100 02/10/13 0517 02/10/13 1021 02/10/13 1422  BP: 117/45 125/50 127/43 111/43  Pulse: 87 87 90 88  Temp: 97.3 F (36.3 C) 97.5 F (36.4 C)  98.4 F (36.9 C)  TempSrc: Oral Oral  Oral  Resp: 18 16 16 18   Height:      Weight:  78.8 kg (173 lb 11.6 oz)    SpO2: 100% 96% 97% 98%   Weight change: 0.146 kg (5.2 oz)  Intake/Output Summary (Last 24 hours) at 02/10/13 1531 Last data filed at 02/10/13 1341  Gross per 24 hour  Intake  942.8 ml  Output    501 ml  Net  441.8 ml   General:  Sitting up in NAD Cardiac: RRR, no rubs, murmurs or gallops Pulm: clear to auscultation bilaterally, moving normal volumes of air Abd: soft, nontender, nondistended, BS present Ext: warm and well perfused, no pedal edema Neuro: alert and oriented X3  Lab Results: Basic Metabolic Panel:  Recent Labs Lab 02/06/13 1218  02/08/13 0535  02/09/13 1524 02/10/13 0400  NA  --   < > 142  < > 139 138  K  --   < > 3.6  < > 3.6 3.8  CL  --   < > 102  < > 99 100  CO2  --   < > 25  < > 27 27  GLUCOSE  --   < > 96  < > 116* 91  BUN  --   < > 25*  < > 38* 43*  CREATININE  --   < > 1.45*  < > 1.84* 1.74*  CALCIUM  --   < > 9.3  < > 8.9 8.9  MG 1.5  --  1.4*  --   --   --   < > = values in this interval not displayed. Liver Function Tests:  Recent Labs Lab 02/06/13 0542  AST 15  ALT 10  ALKPHOS 64  BILITOT 0.5  PROT 6.2  ALBUMIN 3.2*   CBC:  Recent Labs Lab 02/09/13 0800 02/10/13 0945  WBC 8.1 6.2  NEUTROABS 4.9 3.9  HGB 11.4* 10.8*  HCT 33.4* 32.3*  MCV 93.8 94.4  PLT 181 189   Cardiac Enzymes:  Recent Labs Lab 02/06/13 1218  TROPONINI <0.30   BNP:  Recent Labs Lab 02/06/13 0706  PROBNP 752.6*   D-Dimer:  Recent Labs Lab 02/06/13 0701  DDIMER  3.58*   Coagulation:  Recent Labs Lab 02/06/13 1218  LABPROT 13.4  INR 1.04   Urinalysis:  Recent Labs Lab 02/06/13 0634  COLORURINE YELLOW  LABSPEC 1.012  PHURINE 6.0  GLUCOSEU NEGATIVE  HGBUR TRACE*  BILIRUBINUR NEGATIVE  KETONESUR NEGATIVE  PROTEINUR 100*  UROBILINOGEN 0.2  NITRITE POSITIVE*  LEUKOCYTESUR SMALL*    Micro Results: Recent Results (from the past 240 hour(s))  URINE CULTURE     Status: None   Collection Time    02/06/13  6:34 AM      Result Value Range Status   Specimen Description URINE, CLEAN CATCH   Final   Special Requests NONE   Final   Culture  Setup Time     Final   Value: 02/06/2013 12:55     Performed at [facility]  Colony Count     Final   Value: >=100,000 COLONIES/ML     Performed at [facility]   Culture     Final   Value: ESCHERICHIA COLI     Performed at [facility]   Report Status 02/08/2013 FINAL   Final   Organism ID, Bacteria ESCHERICHIA COLI   Final   Medications: I have reviewed the patient's current medications. Scheduled Meds: Continuous Infusions: PRN Meds:.   Assessment/Plan: 79 yo female with a PMH of HF with PEF, HTN, aortic insufficiency, CVA, CKD3 presenting with SOB and foul smelling urine.   #SOB 2/2 CHF exacerbation - patient dyspnea is improving and she is - 2.8L since admission. Her dyspnea has resolved. Cr has bumped 1.04 on admission -->1.74  - d/c Lasix  - 500cc of NS over 3 hours  - AM BMP   #UTI - pt with complaint of foul smelling urine and UA+  - Keflex 500mg  BID   #Hypertension - on losartan 50mg  daily and Toprol XL 100mg  daily at home  - continue home meds   #irregular HR - resolved   #presumed temporal arteritis, biopsy ultimately negative - Prednisone taper complete on 02/08/13   #GERD - Protonix  Dispo: d/c to home today  The patient does have a current PCP Ula Lingo Montey Hora, MD) and does need an [facility] hospital follow-up appointment after  discharge.  The patient does not know have transportation limitations that hinder transportation to clinic appointments.  .Services Needed at time of discharge: Y = Yes, Blank = No PT:   OT:   RN:   Equipment:   Other:     LOS: 4 days   [name], DO 02/12/2013, 1:49 PM

## 2013-02-10 NOTE — Evaluation (Signed)
Physical Therapy Evaluation Patient Details Name: Jane [name] MRN: [id number] DOB: 1928/10/04 Today's Date: 02/10/2013 Time: 0830-0900 PT Time Calculation (min): 30 min  PT Assessment / Plan / Recommendation History of Present Illness  79 yr old woman with HFpEF, HTN, CKD stage 3, hx CVA, presented with SOB and "smelly urine".  She was recently admitted and started on Lasix PO. She followed up in clinic and lasix was stopped due to concern for AKI.  She was told to resume lasix if she developed worsening SOB  Clinical Impression  Pt with decreased activity tolerance however remains in apartment and does not go out unless family with her. Pt managing self well with assist of aide with bathing. Pt to benefit from HHPT to maximize energy conservation techniques and improve activity tolerance. Acute PT to follow to address mention deficits.    PT Assessment  Patient needs continued PT services    Follow Up Recommendations  Home health PT;Supervision - Intermittent    Does the patient have the potential to tolerate intense rehabilitation      Barriers to Discharge Decreased caregiver support pt does have home health aide to assist with bathing a few times a week. Pt does not leave apartement without assist    Equipment Recommendations  None recommended by PT    Recommendations for Other Services     Frequency Min 3X/week    Precautions / Restrictions Precautions Precautions: Fall Restrictions Weight Bearing Restrictions: No   Pertinent Vitals/Pain C/o low back pain but did not rate      Mobility  Bed Mobility Bed Mobility: Not assessed (pt received up in chair) Transfers Transfers: Sit to Stand;Stand to Sit Sit to Stand: With upper extremity assist;4: Min guard;With armrests;From chair/3-in-1 Stand to Sit: 4: Min guard;With upper extremity assist;To chair/3-in-1 Details for Transfer Assistance: increased time Ambulation/Gait Ambulation/Gait Assistance: 4: Min  guard Ambulation Distance (Feet): 60 Feet Assistive device: Rolling walker Ambulation/Gait Assistance Details: freq standing rest stops due to LBP. noted SOB, SpO2 at >96% on RA. HR 108-117. Gait Pattern: Step-through pattern;Decreased stride length Gait velocity: slow General Gait Details: no episodes of LOB however requires definite use of RW Stairs: No    Exercises     PT Diagnosis: Difficulty walking;Generalized weakness  PT Problem List: Decreased strength;Decreased mobility;Decreased activity tolerance PT Treatment Interventions: DME instruction;Gait training;Functional mobility training;Therapeutic activities;Therapeutic exercise     PT Goals(Current goals can be found in the care plan section) Acute Rehab PT Goals Patient Stated Goal: home PT Goal Formulation: With patient Time For Goal Achievement: 02/24/13 Potential to Achieve Goals: Good  Visit Information  Last PT Received On: 02/10/13 Assistance Needed: +1 History of Present Illness: 79 yr old woman with HFpEF, HTN, CKD stage 3, hx CVA, presented with SOB and "smelly urine".  She was recently admitted and started on Lasix PO. She followed up in clinic and lasix was stopped due to concern for AKI.  She was told to resume lasix if she developed worsening SOB       Prior Functioning  Home Living Family/patient expects to be discharged to:: Private residence Living Arrangements: Alone Available Help at Discharge: Family;Available PRN/intermittently Type of Home: Apartment Home Access: Elevator Home Layout: One level Home Equipment: Cane - single point;Walker - 2 wheels;Grab bars - tub/shower;Grab bars - toilet Prior Function Comments: pt sponge bathes when alone, "furniture walks" Communication Communication: No difficulties Dominant Hand: Right    Cognition  Cognition Arousal/Alertness: Awake/alert Behavior During Therapy: WFL for tasks assessed/performed  Overall Cognitive Status: Within Functional Limits  for tasks assessed    Extremity/Trunk Assessment Upper Extremity Assessment Upper Extremity Assessment: Generalized weakness Lower Extremity Assessment Lower Extremity Assessment: Generalized weakness Cervical / Trunk Assessment Cervical / Trunk Assessment:  (increased trunk flexion due to LBP)   Balance    End of Session PT - End of Session Equipment Utilized During Treatment: Gait belt Activity Tolerance: Patient tolerated treatment well;Patient limited by pain Patient left: in chair;with call bell/phone within reach Nurse Communication: Mobility status  GP     Marcene Brawn 02/10/2013, 9:22 AM  Lewis Shock, PT, DPT Pager #: [phone] Office #: [phone]

## 2013-02-10 NOTE — Plan of Care (Signed)
Problem: Phase I Progression Outcomes Goal: Dyspnea controlled at rest (HF) Outcome: Completed/Met Date Met:  02/10/13 Pt still dyspneic with exertion Goal: EF % per last Echo/documented,Core Reminder form on chart Outcome: Completed/Met Date Met:  02/10/13 EF 60-65% per echo on 12/28/12 Goal: Up in chair, BRP Outcome: Completed/Met Date Met:  02/10/13 Pt OOB to [facility] with 1 assist. Pt resting in chair over night.

## 2013-02-11 DIAGNOSIS — R209 Unspecified disturbances of skin sensation: Secondary | ICD-10-CM | POA: Diagnosis not present

## 2013-02-11 DIAGNOSIS — F411 Generalized anxiety disorder: Secondary | ICD-10-CM | POA: Diagnosis not present

## 2013-02-11 DIAGNOSIS — D649 Anemia, unspecified: Secondary | ICD-10-CM | POA: Diagnosis not present

## 2013-02-11 DIAGNOSIS — L97809 Non-pressure chronic ulcer of other part of unspecified lower leg with unspecified severity: Secondary | ICD-10-CM | POA: Diagnosis not present

## 2013-02-11 DIAGNOSIS — I10 Essential (primary) hypertension: Secondary | ICD-10-CM | POA: Diagnosis not present

## 2013-02-12 ENCOUNTER — Ambulatory Visit: Payer: Medicare Other | Admitting: Internal Medicine

## 2013-02-12 DIAGNOSIS — L97809 Non-pressure chronic ulcer of other part of unspecified lower leg with unspecified severity: Secondary | ICD-10-CM | POA: Diagnosis not present

## 2013-02-12 DIAGNOSIS — D649 Anemia, unspecified: Secondary | ICD-10-CM | POA: Diagnosis not present

## 2013-02-12 DIAGNOSIS — I69998 Other sequelae following unspecified cerebrovascular disease: Secondary | ICD-10-CM | POA: Diagnosis not present

## 2013-02-12 DIAGNOSIS — R209 Unspecified disturbances of skin sensation: Secondary | ICD-10-CM | POA: Diagnosis not present

## 2013-02-12 DIAGNOSIS — I10 Essential (primary) hypertension: Secondary | ICD-10-CM | POA: Diagnosis not present

## 2013-02-12 DIAGNOSIS — F411 Generalized anxiety disorder: Secondary | ICD-10-CM | POA: Diagnosis not present

## 2013-02-14 NOTE — Progress Notes (Signed)
I agree with plan by Student Doctor Yetta Barre.  Please see my note for details.

## 2013-02-15 NOTE — Progress Notes (Signed)
Agree with findings and plan by Student Doctor Yetta Barre.

## 2013-02-16 DIAGNOSIS — I10 Essential (primary) hypertension: Secondary | ICD-10-CM | POA: Diagnosis not present

## 2013-02-16 DIAGNOSIS — I69998 Other sequelae following unspecified cerebrovascular disease: Secondary | ICD-10-CM | POA: Diagnosis not present

## 2013-02-16 DIAGNOSIS — F411 Generalized anxiety disorder: Secondary | ICD-10-CM | POA: Diagnosis not present

## 2013-02-16 DIAGNOSIS — D649 Anemia, unspecified: Secondary | ICD-10-CM | POA: Diagnosis not present

## 2013-02-16 DIAGNOSIS — R209 Unspecified disturbances of skin sensation: Secondary | ICD-10-CM | POA: Diagnosis not present

## 2013-02-16 DIAGNOSIS — L97809 Non-pressure chronic ulcer of other part of unspecified lower leg with unspecified severity: Secondary | ICD-10-CM | POA: Diagnosis not present

## 2013-02-16 NOTE — Discharge Summary (Signed)
  Date: 02/16/2013  Patient name: Jane [name]  Medical record number: [id number]  Date of birth: 12/21/1928   This patient has been seen and the plan of care was discussed with the house staff. Please see their note for complete details. I concur with their findings with the following additions/corrections:  Agree with the plan as outlined by Dr. Orlin Hilding, MD 02/16/2013, 9:52 PM

## 2013-02-17 DIAGNOSIS — I129 Hypertensive chronic kidney disease with stage 1 through stage 4 chronic kidney disease, or unspecified chronic kidney disease: Secondary | ICD-10-CM | POA: Diagnosis not present

## 2013-02-17 DIAGNOSIS — Z602 Problems related to living alone: Secondary | ICD-10-CM | POA: Diagnosis not present

## 2013-02-17 DIAGNOSIS — I509 Heart failure, unspecified: Secondary | ICD-10-CM | POA: Diagnosis not present

## 2013-02-17 DIAGNOSIS — N390 Urinary tract infection, site not specified: Secondary | ICD-10-CM | POA: Diagnosis not present

## 2013-02-17 DIAGNOSIS — R209 Unspecified disturbances of skin sensation: Secondary | ICD-10-CM | POA: Diagnosis not present

## 2013-02-17 DIAGNOSIS — D649 Anemia, unspecified: Secondary | ICD-10-CM | POA: Diagnosis not present

## 2013-02-17 DIAGNOSIS — I69998 Other sequelae following unspecified cerebrovascular disease: Secondary | ICD-10-CM | POA: Diagnosis not present

## 2013-02-17 DIAGNOSIS — F411 Generalized anxiety disorder: Secondary | ICD-10-CM | POA: Diagnosis not present

## 2013-02-17 NOTE — Telephone Encounter (Signed)
Pt readmitted to hospital 8/9

## 2013-02-18 DIAGNOSIS — R209 Unspecified disturbances of skin sensation: Secondary | ICD-10-CM | POA: Diagnosis not present

## 2013-02-18 DIAGNOSIS — I129 Hypertensive chronic kidney disease with stage 1 through stage 4 chronic kidney disease, or unspecified chronic kidney disease: Secondary | ICD-10-CM | POA: Diagnosis not present

## 2013-02-18 DIAGNOSIS — I509 Heart failure, unspecified: Secondary | ICD-10-CM | POA: Diagnosis not present

## 2013-02-18 DIAGNOSIS — I69998 Other sequelae following unspecified cerebrovascular disease: Secondary | ICD-10-CM | POA: Diagnosis not present

## 2013-02-18 DIAGNOSIS — N390 Urinary tract infection, site not specified: Secondary | ICD-10-CM | POA: Diagnosis not present

## 2013-02-18 NOTE — Addendum Note (Signed)
Addended by: Neomia Dear on: 02/18/2013 06:25 PM   Modules accepted: Orders

## 2013-02-19 DIAGNOSIS — I129 Hypertensive chronic kidney disease with stage 1 through stage 4 chronic kidney disease, or unspecified chronic kidney disease: Secondary | ICD-10-CM | POA: Diagnosis not present

## 2013-02-19 DIAGNOSIS — I509 Heart failure, unspecified: Secondary | ICD-10-CM | POA: Diagnosis not present

## 2013-02-19 DIAGNOSIS — R209 Unspecified disturbances of skin sensation: Secondary | ICD-10-CM | POA: Diagnosis not present

## 2013-02-19 DIAGNOSIS — I69998 Other sequelae following unspecified cerebrovascular disease: Secondary | ICD-10-CM | POA: Diagnosis not present

## 2013-02-19 DIAGNOSIS — N390 Urinary tract infection, site not specified: Secondary | ICD-10-CM | POA: Diagnosis not present

## 2013-02-22 DIAGNOSIS — I129 Hypertensive chronic kidney disease with stage 1 through stage 4 chronic kidney disease, or unspecified chronic kidney disease: Secondary | ICD-10-CM | POA: Diagnosis not present

## 2013-02-22 DIAGNOSIS — I509 Heart failure, unspecified: Secondary | ICD-10-CM | POA: Diagnosis not present

## 2013-02-22 DIAGNOSIS — I69998 Other sequelae following unspecified cerebrovascular disease: Secondary | ICD-10-CM | POA: Diagnosis not present

## 2013-02-22 DIAGNOSIS — N390 Urinary tract infection, site not specified: Secondary | ICD-10-CM | POA: Diagnosis not present

## 2013-02-22 DIAGNOSIS — R209 Unspecified disturbances of skin sensation: Secondary | ICD-10-CM | POA: Diagnosis not present

## 2013-02-23 DIAGNOSIS — R209 Unspecified disturbances of skin sensation: Secondary | ICD-10-CM | POA: Diagnosis not present

## 2013-02-23 DIAGNOSIS — I69998 Other sequelae following unspecified cerebrovascular disease: Secondary | ICD-10-CM | POA: Diagnosis not present

## 2013-02-23 DIAGNOSIS — I509 Heart failure, unspecified: Secondary | ICD-10-CM | POA: Diagnosis not present

## 2013-02-23 DIAGNOSIS — N390 Urinary tract infection, site not specified: Secondary | ICD-10-CM | POA: Diagnosis not present

## 2013-02-23 DIAGNOSIS — I129 Hypertensive chronic kidney disease with stage 1 through stage 4 chronic kidney disease, or unspecified chronic kidney disease: Secondary | ICD-10-CM | POA: Diagnosis not present

## 2013-02-24 DIAGNOSIS — I69998 Other sequelae following unspecified cerebrovascular disease: Secondary | ICD-10-CM | POA: Diagnosis not present

## 2013-02-24 DIAGNOSIS — R209 Unspecified disturbances of skin sensation: Secondary | ICD-10-CM | POA: Diagnosis not present

## 2013-02-24 DIAGNOSIS — N390 Urinary tract infection, site not specified: Secondary | ICD-10-CM | POA: Diagnosis not present

## 2013-02-24 DIAGNOSIS — I129 Hypertensive chronic kidney disease with stage 1 through stage 4 chronic kidney disease, or unspecified chronic kidney disease: Secondary | ICD-10-CM | POA: Diagnosis not present

## 2013-02-24 DIAGNOSIS — I509 Heart failure, unspecified: Secondary | ICD-10-CM | POA: Diagnosis not present

## 2013-02-26 ENCOUNTER — Ambulatory Visit: Payer: Medicare Other | Admitting: Internal Medicine

## 2013-03-02 ENCOUNTER — Telehealth: Payer: Self-pay | Admitting: *Deleted

## 2013-03-02 DIAGNOSIS — N390 Urinary tract infection, site not specified: Secondary | ICD-10-CM | POA: Diagnosis not present

## 2013-03-02 DIAGNOSIS — I69998 Other sequelae following unspecified cerebrovascular disease: Secondary | ICD-10-CM | POA: Diagnosis not present

## 2013-03-02 DIAGNOSIS — R209 Unspecified disturbances of skin sensation: Secondary | ICD-10-CM | POA: Diagnosis not present

## 2013-03-02 DIAGNOSIS — I129 Hypertensive chronic kidney disease with stage 1 through stage 4 chronic kidney disease, or unspecified chronic kidney disease: Secondary | ICD-10-CM | POA: Diagnosis not present

## 2013-03-02 DIAGNOSIS — I509 Heart failure, unspecified: Secondary | ICD-10-CM | POA: Diagnosis not present

## 2013-03-02 NOTE — Telephone Encounter (Signed)
Call from Diane, Occupational Therapist with [facility] - #  [phone] She wants you to know pt has been discharged from OT one visit earlier that the original plan.  This is due to non-participation. Call if you have any questions.

## 2013-03-03 ENCOUNTER — Ambulatory Visit (INDEPENDENT_AMBULATORY_CARE_PROVIDER_SITE_OTHER): Payer: Medicare Other | Admitting: Internal Medicine

## 2013-03-03 ENCOUNTER — Encounter: Payer: Self-pay | Admitting: Internal Medicine

## 2013-03-03 ENCOUNTER — Ambulatory Visit (HOSPITAL_COMMUNITY)
Admission: RE | Admit: 2013-03-03 | Discharge: 2013-03-03 | Disposition: A | Discharge status: Home / Self Care | Payer: Medicare Other | Source: Ambulatory Visit | Attending: Internal Medicine | Admitting: Internal Medicine

## 2013-03-03 VITALS — BP 148/73 | HR 95 | Temp 98.2°F | Wt 180.5 lb

## 2013-03-03 DIAGNOSIS — R609 Edema, unspecified: Secondary | ICD-10-CM | POA: Diagnosis not present

## 2013-03-03 DIAGNOSIS — M79662 Pain in left lower leg: Secondary | ICD-10-CM

## 2013-03-03 DIAGNOSIS — M79609 Pain in unspecified limb: Secondary | ICD-10-CM | POA: Insufficient documentation

## 2013-03-03 DIAGNOSIS — I5030 Unspecified diastolic (congestive) heart failure: Secondary | ICD-10-CM

## 2013-03-03 DIAGNOSIS — I509 Heart failure, unspecified: Secondary | ICD-10-CM

## 2013-03-03 LAB — BASIC METABOLIC PANEL WITH GFR
CO2: 24 mEq/L (ref 19–32)
Chloride: 110 mEq/L (ref 96–112)
Glucose, Bld: 77 mg/dL (ref 70–99)
Potassium: 3.7 mEq/L (ref 3.5–5.3)
Sodium: 144 mEq/L (ref 135–145)

## 2013-03-03 MED ORDER — FUROSEMIDE 20 MG PO TABS: 20.0000 mg | ORAL_TABLET | Freq: Two times a day (BID) | ORAL | Status: DC

## 2013-03-03 NOTE — Progress Notes (Signed)
Patient ID: Jane [name], female   DOB: 1928-07-04, 79 y.o.   MRN: [id number]  Subjective:   Patient ID: Jane [name] female   DOB: 1928-11-25 79 y.o.   MRN: [id number]  CC:   Hospital followup visit.    HPI:  [name] is a 79 y.o. lady with past medical history as outlined below, who presents for a hospital followup visit today.  1. CHF: She was hospitalized from 8/9 to 8/12 because of diastolic congestive heart failure exacerbation. Patient's 2-D echo on 12/28/12 showed EF of 60-65% with grade 1 diastolic dysfunction. Her pro-BNP was elevated to 753. CXR showed vascular congestion. She was treated with IV Lasix, which was discontinued because of elevation of her creatinine to 1.84 on 8/12. By that time, patient was clinically euvolemic on physical exam.  Her symptoms resolved with diuresis, her Cre began to trend down 184--> 1.74  and she was discharged in stable condition on hospital day 4. Today, she reports that she feels fine except for left leg pain over the calf area, and bilateral leg edema (left worse than right). She has mild shortness of breath on exertion. She denies PND or orthopnea. Her body weight increased from 173 on 8/9 to 180 Lbs today 03/03/13.    2. Leg pain and swelling: Patient has bilateral lower leg edema. It is asymmetric with left worse than right leg. She has significant tenderness over left calf area.  3. UTI: Patient was treated for UTI in the hospital. She completed antibiotic treatment. This problem has resolved. She does not have any symptoms for UTI normal.  ROS:  Denies fever, chills, fatigue, headaches,  cough, chest pain, abdominal pain, diarrhea, constipation, dysuria, urgency, frequency, hematuria.   Past Medical History  Diagnosis Date  . Anemia   . Anxiety   . Arthritis   . GERD (gastroesophageal reflux disease)   . Hyperlipidemia   . Hypertension   . Vitamin D deficiency   . Colon polyp     2009 colonoscopy  . Hiatal hernia   .  Diverticulosis   . Aortic insufficiency     mild  . Stroke 1975  . CKD (chronic kidney disease) stage 3, GFR 30-59 ml/min   . CHF (congestive heart failure)    Current Outpatient Prescriptions  Medication Sig Dispense Refill  . acetaminophen (TYLENOL) 325 MG tablet Take 2 tablets (650 mg total) by mouth every 6 (six) hours as needed for pain.  30 tablet  0  . artificial tears (LACRILUBE) OINT ophthalmic ointment Apply 1 application to eye at bedtime and may repeat dose one time if needed. A thin layer of ointment should be applied to the exposed eyelid, taking care not to touch tip of applicator to eyelid; if complaints of ocular dryness, notify MD  1 Tube  0  . aspirin EC 81 MG tablet Take 81 mg by mouth daily.      . Ferrous Gluconate (FERATE) 256 (28 FE) MG TABS Take 256 mg by mouth daily.  30 tablet  3  . losartan (COZAAR) 50 MG tablet Take 1 tablet (50 mg total) by mouth daily.  90 tablet  3  . metoprolol (TOPROL-XL) 200 MG 24 hr tablet Take 0.5 tablets (100 mg total) by mouth daily.  30 tablet  6  . omeprazole (PRILOSEC) 20 MG capsule Take 1 capsule (20 mg total) by mouth daily. Will sometimes take twice daily if heartburn increases.  30 capsule  11  . furosemide (LASIX) 20  MG tablet Take 1 tablet (20 mg total) by mouth 2 (two) times daily.  30 tablet  3   No current facility-administered medications for this visit.   Family History  Problem Relation Age of Onset  . Stroke Mother   . Hypertension Mother   . Stroke Father   . Hypertension Father   . Diabetes Sister    History   Social History  . Marital Status: Widowed    Spouse Name: N/A    Number of Children: N/A  . Years of Education: N/A   Social History Main Topics  . Smoking status: Former Smoker    Types: Cigarettes    Quit date: 07/01/1984  . Smokeless tobacco: Former Neurosurgeon  . Alcohol Use: No  . Drug Use: No  . Sexual Activity: No   Other Topics Concern  . None   Social History Narrative  . None     Review of Systems: Full 14-point review of systems otherwise negative. See HPI.   Objective:  Physical Exam: Filed Vitals:   03/03/13 1506  BP: 148/73  Pulse: 95  Temp: 98.2 F (36.8 C)  TempSrc: Oral  Weight: 180 lb 8 oz (81.874 kg)  SpO2: 94%   General: resting in the wheelchair in NAD, responding appropriately HEENT: L lower lid ectropion, no JVD appreciated Cardiac: Regular rhythm, no rubs, murmurs or gallops Pulm: clear to auscultation bilaterally, moving normal volumes of air, on 3L via [place] Abd: soft, nontender, nondistended, BS present, no suprapubic tenderness, no CVAT Ext:  B/L lower extremity pitting edema (L>R) .There is significant tenderness over the left calf area.  Neuro: alert and oriented X3    Assessment & Plan:

## 2013-03-03 NOTE — Patient Instructions (Signed)
1. Please start taking lasix 20 mg bid from now. Please come back in one week.    2. Please take all medications as prescribed.  3. If you have worsening of your symptoms or new symptoms arise, please call the clinic ([phone]), or go to the ER immediately if symptoms are severe.  You have done great job in taking all your medications. I appreciate it very much. Please continue doing that.

## 2013-03-03 NOTE — Assessment & Plan Note (Signed)
Patient is symptomatically stable. She does not have worsening of shortness of breath. No PND. She has a significant bilateral leg edema (R>L). Body weight increased by 7 pounds since 8/9. She may have mild diastolic congestive heart failure exacerbation now.  - will start low dose lasix 20 mg bid daily. - since patient is on losartan and her kidney function was abnormal at recent discharge, I will not put her on potassium pill today. - will check BMP - will follow up in one week.

## 2013-03-03 NOTE — Progress Notes (Signed)
Left lower extremity venous duplex completed.  Left:  No evidence of DVT, superficial thrombosis, or Baker's cyst.  Right:  Negative for DVT in the common femoral vein.  

## 2013-03-03 NOTE — Assessment & Plan Note (Addendum)
Patient has asymmetric lower leg edema, right leg is worse than the left. Patient also has a significant tenderness over the calf area. Her leg edema can be partially explained by the congestive heart failure. However, patient is at risk to develop DVT both b/c of recent hospital admission and decreased activities after being discharged from the hospital.   We did stat venous doppler which is negative for DVT. Patient will be lasix for leg edema and follow up in clinic in one week.

## 2013-03-04 DIAGNOSIS — I69998 Other sequelae following unspecified cerebrovascular disease: Secondary | ICD-10-CM | POA: Diagnosis not present

## 2013-03-04 DIAGNOSIS — R209 Unspecified disturbances of skin sensation: Secondary | ICD-10-CM | POA: Diagnosis not present

## 2013-03-04 DIAGNOSIS — N390 Urinary tract infection, site not specified: Secondary | ICD-10-CM | POA: Diagnosis not present

## 2013-03-04 DIAGNOSIS — I509 Heart failure, unspecified: Secondary | ICD-10-CM | POA: Diagnosis not present

## 2013-03-04 DIAGNOSIS — I129 Hypertensive chronic kidney disease with stage 1 through stage 4 chronic kidney disease, or unspecified chronic kidney disease: Secondary | ICD-10-CM | POA: Diagnosis not present

## 2013-03-04 NOTE — Progress Notes (Signed)
Case discussed with Dr. Clyde Lundborg at the time of the visit.  We reviewed the resident's history and exam and pertinent patient test results.  I agree with the assessment, diagnosis, and plan of care documented in the resident's note.  UPDATE: Ultrasound negative.

## 2013-03-04 NOTE — Addendum Note (Signed)
Addended by: Debe Coder B on: 03/04/2013 02:12 PM   Modules accepted: Level of Service

## 2013-03-05 ENCOUNTER — Other Ambulatory Visit: Payer: Self-pay | Admitting: Internal Medicine

## 2013-03-05 DIAGNOSIS — I129 Hypertensive chronic kidney disease with stage 1 through stage 4 chronic kidney disease, or unspecified chronic kidney disease: Secondary | ICD-10-CM | POA: Diagnosis not present

## 2013-03-05 DIAGNOSIS — N390 Urinary tract infection, site not specified: Secondary | ICD-10-CM | POA: Diagnosis not present

## 2013-03-05 DIAGNOSIS — R209 Unspecified disturbances of skin sensation: Secondary | ICD-10-CM | POA: Diagnosis not present

## 2013-03-05 DIAGNOSIS — I509 Heart failure, unspecified: Secondary | ICD-10-CM

## 2013-03-05 DIAGNOSIS — I69998 Other sequelae following unspecified cerebrovascular disease: Secondary | ICD-10-CM | POA: Diagnosis not present

## 2013-03-10 ENCOUNTER — Ambulatory Visit: Payer: Medicare Other | Admitting: Internal Medicine

## 2013-03-11 ENCOUNTER — Ambulatory Visit (INDEPENDENT_AMBULATORY_CARE_PROVIDER_SITE_OTHER): Payer: Medicare Other | Admitting: Internal Medicine

## 2013-03-11 ENCOUNTER — Encounter: Payer: Self-pay | Admitting: Internal Medicine

## 2013-03-11 VITALS — BP 138/69 | HR 91 | Temp 97.0°F | Wt 177.1 lb

## 2013-03-11 DIAGNOSIS — I509 Heart failure, unspecified: Secondary | ICD-10-CM

## 2013-03-11 DIAGNOSIS — E876 Hypokalemia: Secondary | ICD-10-CM

## 2013-03-11 DIAGNOSIS — I10 Essential (primary) hypertension: Secondary | ICD-10-CM

## 2013-03-11 DIAGNOSIS — B351 Tinea unguium: Secondary | ICD-10-CM

## 2013-03-11 DIAGNOSIS — I5030 Unspecified diastolic (congestive) heart failure: Secondary | ICD-10-CM

## 2013-03-11 LAB — BASIC METABOLIC PANEL
BUN: 23 mg/dL (ref 6–23)
Calcium: 8.9 mg/dL (ref 8.4–10.5)
Chloride: 105 mEq/L (ref 96–112)
Glucose, Bld: 101 mg/dL — ABNORMAL HIGH (ref 70–99)
Potassium: 3.2 mEq/L — ABNORMAL LOW (ref 3.5–5.3)
Sodium: 141 mEq/L (ref 135–145)

## 2013-03-11 MED ORDER — POTASSIUM CHLORIDE ER 20 MEQ PO TBCR: 20.0000 meq | EXTENDED_RELEASE_TABLET | Freq: Every day | ORAL | Status: DC

## 2013-03-11 MED ORDER — POTASSIUM CHLORIDE CRYS ER 20 MEQ PO TBCR
40.0000 meq | EXTENDED_RELEASE_TABLET | Freq: Once | ORAL | Status: AC
  Administered 2013-03-11: 40 meq via ORAL

## 2013-03-11 MED ORDER — POTASSIUM CHLORIDE ER 20 MEQ PO TBCR: 20.0000 meq | EXTENDED_RELEASE_TABLET | Freq: Two times a day (BID) | ORAL | Status: DC

## 2013-03-11 MED ORDER — POTASSIUM CHLORIDE CRYS ER 10 MEQ PO TBCR: 40.0000 meq | EXTENDED_RELEASE_TABLET | Freq: Once | ORAL | Status: DC

## 2013-03-11 NOTE — Assessment & Plan Note (Signed)
Patient has severe onychomycosis in all of her toenails. She has pain over her great toes bilaterally.   -will give referral to podiatrist.

## 2013-03-11 NOTE — Progress Notes (Signed)
Patient ID: Jane [name], female   DOB: December 22, 1928, 79 y.o.   MRN: [id number] Subjective:   Patient ID: Jane [name] female   DOB: 1928/07/22 79 y.o.   MRN: [id number]  CC:  Follow up visit for CHF. HPI:  [name] is a 79 y.o. lady with past medical history as outlined below, who presents for a followup visit today   CHF: Patient was seen in clinic on 03/03/13 for hospital follow up. She was hospitalized from 8/9 to 8/12 because of diastolic congestive heart failure exacerbation. Patient's 2-D echo on 12/28/12 showed EF of 60-65% with grade 1 diastolic dysfunction. In that admission, her pro-BNP was elevated to 753. CXR showed vascular congestion. She was treated with IV Lasix, which was discontinued because of elevation of her creatinine to1.84 on 8/12. By that time, patient was clinically euvolemic on physical exam.  Her symptoms resolved with diuresis, her Cre began to trend down 1.84--> 1.74  and she was discharged in stable condition on hospital day 4.   On 03/03/13, she reported having left leg pain over the calf area, and bilateral leg edema (left worse than right). She had mild shortness of breath on exertion. She denied PND or orthopnea. Her body weight increased from 173 on 8/9 to 80 Lbs today 03/03/13. Her lower extremity venous Doppler was negative for DVT. She was started with low dose of Lasix, 20 mg twice a day. She has been taking this medication regularly. Her body weight decreased from 180 on 03/03/13 to 177 LBs today. Since she is taking losartan and her Cre was elevated at her recent discharge, she was not given potassium pill prescription. Today she does not have chest pain. She has mild SOB which is at her baseline. No palpitation. Her leg edema improved slightly. She still has asymmetric lower leg edema (left worse than right). BMP showed K 3.2 and Cre 0.9.  ROS:  Denies fever, chills, fatigue, headaches, cough, chest pain, abdominal pain, diarrhea, constipation, dysuria,  urgency, frequency, hematuria.   Past Medical History  Diagnosis Date  . Anemia   . Anxiety   . Arthritis   . GERD (gastroesophageal reflux disease)   . Hyperlipidemia   . Hypertension   . Vitamin D deficiency   . Colon polyp     2009 colonoscopy  . Hiatal hernia   . Diverticulosis   . Aortic insufficiency     mild  . Stroke 1975  . CKD (chronic kidney disease) stage 3, GFR 30-59 ml/min   . CHF (congestive heart failure)    Current Outpatient Prescriptions  Medication Sig Dispense Refill  . acetaminophen (TYLENOL) 325 MG tablet Take 2 tablets (650 mg total) by mouth every 6 (six) hours as needed for pain.  30 tablet  0  . artificial tears (LACRILUBE) OINT ophthalmic ointment Apply 1 application to eye at bedtime and may repeat dose one time if needed. A thin layer of ointment should be applied to the exposed eyelid, taking care not to touch tip of applicator to eyelid; if complaints of ocular dryness, notify MD  1 Tube  0  . aspirin EC 81 MG tablet Take 81 mg by mouth daily.      . Ferrous Gluconate (FERATE) 256 (28 FE) MG TABS Take 256 mg by mouth daily.  30 tablet  3  . furosemide (LASIX) 20 MG tablet Take 1 tablet (20 mg total) by mouth 2 (two) times daily.  30 tablet  3  . losartan (COZAAR)  50 MG tablet Take 1 tablet (50 mg total) by mouth daily.  90 tablet  3  . metoprolol (TOPROL-XL) 200 MG 24 hr tablet Take 0.5 tablets (100 mg total) by mouth daily.  30 tablet  6  . omeprazole (PRILOSEC) 20 MG capsule Take 1 capsule (20 mg total) by mouth daily. Will sometimes take twice daily if heartburn increases.  30 capsule  11   No current facility-administered medications for this visit.   Family History  Problem Relation Age of Onset  . Stroke Mother   . Hypertension Mother   . Stroke Father   . Hypertension Father   . Diabetes Sister    History   Social History  . Marital Status: Widowed    Spouse Name: N/A    Number of Children: N/A  . Years of Education: N/A    Social History Main Topics  . Smoking status: Former Smoker    Types: Cigarettes    Quit date: 07/01/1984  . Smokeless tobacco: Former Neurosurgeon  . Alcohol Use: No  . Drug Use: No  . Sexual Activity: No   Other Topics Concern  . None   Social History Narrative  . None    Review of Systems: Full 14-point review of systems otherwise negative. See HPI.  Objective:  Physical Exam: Filed Vitals:   03/11/13 1512  BP: 138/69  Pulse: 91  Weight: 177 lb 1.6 oz (80.332 kg)  SpO2: 98%    General: resting in the wheelchair in NAD, responding appropriately HEENT: L lower lid ectropion, no JVD appreciated Cardiac: Regular rhythm, no rubs, murmurs or gallops Pulm: clear to auscultation bilaterally, moving normal volumes of air, on 3L via  Abd: soft, nontender, nondistended, BS present, no suprapubic tenderness, no CVAT Ext:  B/L asymmetric lower extremity pitting edema (L>R) .There is tenderness over the left lower leg. She has severe onychomycosis in all her toenails, worsen on great toes. There is pain over her great toes bilaterally.  Neuro: alert and oriented X3  Assessment & Plan:

## 2013-03-11 NOTE — Patient Instructions (Addendum)
1. Please take potassium chloride pill, 20 mEq daily from 9/12  2. Please continue to take lasix 20 mg bid.  3. If you have worsening of your symptoms or new symptoms arise, please call the clinic ([phone]), or go to the ER immediately if symptoms are severe.

## 2013-03-11 NOTE — Assessment & Plan Note (Signed)
Patient is symptomatically stable. SOB is at her baseline. No PND. Lung is clear to auscultation. She is taking Lasix 20 mg daily. Her body weight decreased by 3 pounds since last visit, but not back to baseline yet. Her BMP showed potassium 3.2 and creatinine 0.9. HCO3 is 23, indicating no volume contraction. Her leg edema (R>L) improved slightly.   -will give 40 mEq of KCl at clinic X 1 -will start 20 mEq of KCl daily from 03/12/13, and follow up BMP between 9/16 and 03/19/13.  -will continue lasix 40 mg bid and follow up in 3 weeks.

## 2013-03-12 DIAGNOSIS — R209 Unspecified disturbances of skin sensation: Secondary | ICD-10-CM | POA: Diagnosis not present

## 2013-03-12 DIAGNOSIS — I129 Hypertensive chronic kidney disease with stage 1 through stage 4 chronic kidney disease, or unspecified chronic kidney disease: Secondary | ICD-10-CM | POA: Diagnosis not present

## 2013-03-12 DIAGNOSIS — I69998 Other sequelae following unspecified cerebrovascular disease: Secondary | ICD-10-CM | POA: Diagnosis not present

## 2013-03-12 DIAGNOSIS — I509 Heart failure, unspecified: Secondary | ICD-10-CM | POA: Diagnosis not present

## 2013-03-12 DIAGNOSIS — N390 Urinary tract infection, site not specified: Secondary | ICD-10-CM | POA: Diagnosis not present

## 2013-03-12 NOTE — Progress Notes (Signed)
Case discussed with Dr. Niu at the time of the visit.  We reviewed the resident's history and exam and pertinent patient test results.  I agree with the assessment, diagnosis, and plan of care documented in the resident's note.    

## 2013-03-15 ENCOUNTER — Encounter: Payer: Self-pay | Admitting: *Deleted

## 2013-03-15 ENCOUNTER — Telehealth: Payer: Self-pay | Admitting: Licensed Clinical Social Worker

## 2013-03-15 NOTE — Progress Notes (Unsigned)
Patient ID: Jane [name], female   DOB: 1929/03/17, 79 y.o.   MRN: [id number] Pt in need of podiatry appointment.  Pt has transportation needs.  Will forward to CSW to help with transportation assistance as she has medicaid.  Pt stated at last visit that she had used Medicaid transportation years ago, but had a bad experience.  She was advised to use them ago as she has missed her podiatry appointment 2/2 lack of transportation.  Will arrange both podiatry appt and schedule transportation once pt is registered to use service.[name]9/15/201411:25 AM

## 2013-03-15 NOTE — Telephone Encounter (Addendum)
Jane [name] was referred by nursing staff as pt voiced difficulty finding medical transportation.  Pt has used Medicaid medical transportation in the past but complains that she had to wait at length for pick up.  CSW placed call to Ms. Mohl to discuss referral to [facility].  Pt states she was uncertain if she wanted to utilize medicaid medical transportation, pt preferred to find transportation on her own.  CSW encouraged pt to utilize [facility] as a back up in case pt was unable to obtain transportation on her own.  CSW informed Ms. Pollio it's best to wait for transportation than to miss a medical appt.  Pt states she wasn't going to schedule a podiatry appt until transportation was acquired.  CSW reassured pt that no appt has been made.  However, referral to [facility] placed for pt to utilize as a back up for appt as needed.  Ms. Oliveira continues to state "I've got so much on my mind".  CSW inquired more about pt's concerns.  Pt states she can not find page 1 of her AVS and appointment reminder card, thinks possibly her aide took it.  CSW informed Ms. Roebuck, CSW can reprint AVS and place in the mail along with calendar of upcoming appt.  Pt in agreement.  CSW will mail reprint of AVS, Calendar of EPIC appt and [facility] brochure.

## 2013-03-15 NOTE — Progress Notes (Signed)
CSW placed call to [facility] to apply for [employer] transportation and discussed with Ms. Tener.  Pt uncertain if she wants to utilized medicaid medical transportation due to previous bad experiences.  CSW encouraged Ms. Dedominicis to utilize [employer] as a back up if pt unable to arrange transportation on her own.  Ms. Hollis aware [place] will call her today to confirm transportation.  Pt still hesitant and would like to arrange her own transportation prior to appt for podiatry being scheduled.  Pt has been set up with [facility] as a back up measure for medical transportation.

## 2013-03-17 NOTE — Progress Notes (Signed)
Seen and agree  

## 2013-03-23 ENCOUNTER — Other Ambulatory Visit: Payer: Self-pay | Admitting: Internal Medicine

## 2013-04-19 ENCOUNTER — Encounter: Payer: Self-pay | Admitting: Internal Medicine

## 2013-04-19 ENCOUNTER — Encounter: Payer: Medicare Other | Admitting: Internal Medicine

## 2013-05-17 ENCOUNTER — Encounter: Payer: Medicare Other | Admitting: Internal Medicine

## 2013-05-19 ENCOUNTER — Telehealth: Payer: Self-pay | Admitting: *Deleted

## 2013-05-19 NOTE — Telephone Encounter (Signed)
[facility] NURSE, Donn Pierini wallace calls to say pt has missed several prearranged times for a visit and states she does not want assistance except transportation. She states she does not need home health care but her toenails are so bad pt cannot put shoes on? She cancelled her podiatry appt. They are going to offer her transportation and provide her w/ ability to come to clinic appts If any ?'s call juana [phone]

## 2013-05-24 ENCOUNTER — Encounter: Payer: Medicare Other | Admitting: Internal Medicine

## 2013-05-31 ENCOUNTER — Encounter: Payer: Medicare Other | Admitting: Internal Medicine

## 2013-05-31 ENCOUNTER — Encounter: Payer: Self-pay | Admitting: Internal Medicine

## 2013-06-03 ENCOUNTER — Other Ambulatory Visit: Payer: Self-pay | Admitting: *Deleted

## 2013-06-03 DIAGNOSIS — I509 Heart failure, unspecified: Secondary | ICD-10-CM

## 2013-06-03 MED ORDER — POTASSIUM CHLORIDE ER 20 MEQ PO TBCR: 20.0000 meq | EXTENDED_RELEASE_TABLET | Freq: Every day | ORAL | Status: DC

## 2013-06-03 NOTE — Addendum Note (Signed)
Addended by: Bufford Spikes on: 06/03/2013 03:21 PM   Modules accepted: Orders

## 2013-06-08 NOTE — Addendum Note (Signed)
Addended by: Neomia Dear on: 06/08/2013 05:35 PM   Modules accepted: Orders

## 2013-06-28 ENCOUNTER — Ambulatory Visit: Payer: Medicare Other | Admitting: Internal Medicine

## 2013-08-09 ENCOUNTER — Encounter: Payer: Self-pay | Admitting: Internal Medicine

## 2013-08-09 ENCOUNTER — Ambulatory Visit (INDEPENDENT_AMBULATORY_CARE_PROVIDER_SITE_OTHER): Payer: Medicare Other | Admitting: Internal Medicine

## 2013-08-09 VITALS — BP 164/71 | HR 67 | Temp 97.0°F | Ht 65.0 in | Wt 173.7 lb

## 2013-08-09 DIAGNOSIS — R21 Rash and other nonspecific skin eruption: Secondary | ICD-10-CM

## 2013-08-09 DIAGNOSIS — D509 Iron deficiency anemia, unspecified: Secondary | ICD-10-CM | POA: Diagnosis not present

## 2013-08-09 DIAGNOSIS — H02109 Unspecified ectropion of unspecified eye, unspecified eyelid: Secondary | ICD-10-CM | POA: Diagnosis not present

## 2013-08-09 DIAGNOSIS — H02105 Unspecified ectropion of left lower eyelid: Secondary | ICD-10-CM

## 2013-08-09 DIAGNOSIS — Z0000 Encounter for general adult medical examination without abnormal findings: Secondary | ICD-10-CM

## 2013-08-09 DIAGNOSIS — Z23 Encounter for immunization: Secondary | ICD-10-CM | POA: Diagnosis not present

## 2013-08-09 DIAGNOSIS — H02539 Eyelid retraction unspecified eye, unspecified lid: Secondary | ICD-10-CM | POA: Diagnosis not present

## 2013-08-09 DIAGNOSIS — H02536 Eyelid retraction left eye, unspecified eyelid: Secondary | ICD-10-CM

## 2013-08-09 DIAGNOSIS — I10 Essential (primary) hypertension: Secondary | ICD-10-CM

## 2013-08-09 MED ORDER — FERROUS SULFATE 325 (65 FE) MG PO TBEC: 325.0000 mg | DELAYED_RELEASE_TABLET | Freq: Two times a day (BID) | ORAL | Status: DC

## 2013-08-09 NOTE — Assessment & Plan Note (Addendum)
Palpable splotchy mildly erythematous plaques to Inner thighs, upper arms and upper chest area x 1 months, non-pruritic, no change in cleansers Pt relates timing to stopping fe pills -refer to [facility]

## 2013-08-09 NOTE — Assessment & Plan Note (Signed)
Flu shot

## 2013-08-09 NOTE — Assessment & Plan Note (Signed)
Left lower lid moderately everted today with exposure of inferior conjunctival surface Pt using eye drops and ointment -Will refer to Ophthamology for further evaluation

## 2013-08-09 NOTE — Progress Notes (Signed)
   Subjective:    Patient ID: Jane [name], female    DOB: [birth year], 80 y.o.   MRN: [id number]  HPI  Jane [name] presents for follow-up of left eyelid ectropion and reported new rash for past 1 months.   She states that she has not been able to get her iron supplements filled bc the pharmacy no longer carries her prescribed brand. She also reports since running out of the iron supplements, she has had a rash on her thighs and arms. Denies new soaps or lotions.  States that she has been using rubbing alcohol to make the rashes go away without success.    Review of Systems  Constitutional: Negative for fever, chills and fatigue.  HENT: Negative.   Eyes: Positive for discharge. Negative for pain, redness, itching and visual disturbance.       Right eye with watery discharge, left eye with "droopy" lower lid.  Respiratory: Negative for shortness of breath.   Cardiovascular: Negative for chest pain and leg swelling.  Endocrine: Negative.   Genitourinary: Negative.   Allergic/Immunologic: Negative for environmental allergies and food allergies.  Neurological: Negative for dizziness, weakness, light-headedness and numbness.  Hematological: Does not bruise/bleed easily.  Psychiatric/Behavioral: Negative.        Objective:   Physical Exam  Constitutional: She is oriented to person, place, and time. She appears well-developed and well-nourished. No distress.  Elderly female with family present  HENT:  Head: Normocephalic and atraumatic.  Eyes: Conjunctivae and EOM are normal. Pupils are equal, round, and reactive to light. Right eye exhibits discharge. Right eye exhibits no chemosis and no exudate. Left eye exhibits hordeolum. Left eye exhibits no chemosis, no discharge and no exudate. No foreign body present in the left eye. No scleral icterus.    Slight clear discharge to right eye  Cardiovascular: Normal rate, regular rhythm and normal heart sounds.   Pulmonary/Chest: Effort  normal and breath sounds normal.  Abdominal: Soft. Bowel sounds are normal.  Musculoskeletal: Normal range of motion. She exhibits no edema.  Neurological: She is alert and oriented to person, place, and time.  Skin: Skin is warm and dry. Rash noted. Rash is urticarial. No erythema.     Splotchy non-itchy rash inner thighs, antecubital surface of arms and upper chest area  Psychiatric: She has a normal mood and affect.          Assessment & Plan:  See separate problem list charting:

## 2013-08-09 NOTE — Assessment & Plan Note (Signed)
BP Readings from Last 3 Encounters:  08/09/13 165/63  03/11/13 138/69  03/03/13 148/73    Lab Results  Component Value Date   NA 141 03/11/2013   K 3.2* 03/11/2013   CREATININE 0.90 03/11/2013    Assessment: Blood pressure control: moderately elevated Progress toward BP goal:  deteriorated Comments:   Plan: Medications:  continue current medications Educational resources provided: brochure;handout;video Self management tools provided:   Other plans: cont BB and ARB

## 2013-08-09 NOTE — Patient Instructions (Addendum)
General Instructions: You received the flu shot today. We will refer you to the skin and eye doctor. Fil the prescription for your new iron pills. Follow-up in 6 months.   Treatment Goals:  Goals (1 Years of Data) as of 08/09/13         As of Today 03/11/13 03/03/13 02/10/13 02/10/13     Blood Pressure    . Blood Pressure < 160/90  165/63 138/69 148/73 111/43 127/43      Progress Toward Treatment Goals:  Treatment Goal 08/09/2013  Blood pressure deteriorated    Self Care Goals & Plans:  Self Care Goal 08/09/2013  Manage my medications take my medicines as prescribed; bring my medications to every visit; refill my medications on time; follow the sick day instructions if I am sick  Monitor my health keep track of my weight  Eat healthy foods eat more vegetables; eat fruit for snacks and desserts; drink diet soda or water instead of juice or soda; eat smaller portions; eat baked foods instead of fried foods  Be physically active find an activity I enjoy    No flowsheet data found.   Care Management & Community Referrals:  Referral 09/18/2012  Referrals made for care management support none needed

## 2013-08-09 NOTE — Assessment & Plan Note (Signed)
Refilled Fe supplementation Would like to f/u CBC after resumption of iron supplements Defer CBC today as Pt arrived 30 minutes late for appt and lab closed for lunch

## 2013-08-09 NOTE — Assessment & Plan Note (Signed)
Everted lower lid, will refer to Ophthamology

## 2013-08-10 NOTE — Progress Notes (Signed)
Case discussed with Dr. Schooler at the time of the visit.  We reviewed the resident's history and exam and pertinent patient test results.  I agree with the assessment, diagnosis, and plan of care documented in the resident's note.     

## 2013-08-25 ENCOUNTER — Telehealth: Payer: Self-pay | Admitting: *Deleted

## 2013-08-25 NOTE — Telephone Encounter (Signed)
Pt called with c/o itching and rash since last visit on 2/9. Areas involved are inner thighs, upper arms and chest. No improvement since last visit. She has tried benadryl without relief. Pt was given a dermatology referral.  Will check with [facility] about this.  Any other suggestions?  Pt # [phone] We don's have any appointments for the rest of this week.

## 2013-08-25 NOTE — Telephone Encounter (Signed)
I did not see her with Dr. Michail Sermon last visit, but if it is progressing, I would have her see UC if we have no appointments today.

## 2013-08-25 NOTE — Telephone Encounter (Signed)
Pt informed and voices understanding 

## 2013-09-09 ENCOUNTER — Encounter (HOSPITAL_COMMUNITY): Payer: Self-pay | Admitting: Emergency Medicine

## 2013-09-09 ENCOUNTER — Observation Stay (HOSPITAL_COMMUNITY)
Admission: EM | Admit: 2013-09-09 | Discharge: 2013-09-11 | Disposition: A | Discharge status: Home / Self Care | Payer: Medicare Other | Attending: Internal Medicine | Admitting: Internal Medicine

## 2013-09-09 ENCOUNTER — Emergency Department (HOSPITAL_COMMUNITY): Payer: Medicare Other

## 2013-09-09 ENCOUNTER — Observation Stay (HOSPITAL_COMMUNITY): Payer: Medicare Other

## 2013-09-09 DIAGNOSIS — E785 Hyperlipidemia, unspecified: Secondary | ICD-10-CM | POA: Diagnosis not present

## 2013-09-09 DIAGNOSIS — R21 Rash and other nonspecific skin eruption: Secondary | ICD-10-CM | POA: Insufficient documentation

## 2013-09-09 DIAGNOSIS — I359 Nonrheumatic aortic valve disorder, unspecified: Secondary | ICD-10-CM | POA: Insufficient documentation

## 2013-09-09 DIAGNOSIS — M129 Arthropathy, unspecified: Secondary | ICD-10-CM | POA: Diagnosis not present

## 2013-09-09 DIAGNOSIS — N183 Chronic kidney disease, stage 3 unspecified: Secondary | ICD-10-CM | POA: Diagnosis not present

## 2013-09-09 DIAGNOSIS — D509 Iron deficiency anemia, unspecified: Secondary | ICD-10-CM | POA: Insufficient documentation

## 2013-09-09 DIAGNOSIS — K219 Gastro-esophageal reflux disease without esophagitis: Secondary | ICD-10-CM | POA: Insufficient documentation

## 2013-09-09 DIAGNOSIS — F411 Generalized anxiety disorder: Secondary | ICD-10-CM | POA: Diagnosis not present

## 2013-09-09 DIAGNOSIS — I129 Hypertensive chronic kidney disease with stage 1 through stage 4 chronic kidney disease, or unspecified chronic kidney disease: Secondary | ICD-10-CM | POA: Insufficient documentation

## 2013-09-09 DIAGNOSIS — Z860100 Personal history of colon polyps, unspecified: Secondary | ICD-10-CM | POA: Insufficient documentation

## 2013-09-09 DIAGNOSIS — K5730 Diverticulosis of large intestine without perforation or abscess without bleeding: Secondary | ICD-10-CM | POA: Diagnosis present

## 2013-09-09 DIAGNOSIS — Z7982 Long term (current) use of aspirin: Secondary | ICD-10-CM | POA: Insufficient documentation

## 2013-09-09 DIAGNOSIS — K649 Unspecified hemorrhoids: Secondary | ICD-10-CM | POA: Diagnosis present

## 2013-09-09 DIAGNOSIS — K5900 Constipation, unspecified: Secondary | ICD-10-CM | POA: Diagnosis not present

## 2013-09-09 DIAGNOSIS — D649 Anemia, unspecified: Secondary | ICD-10-CM | POA: Diagnosis present

## 2013-09-09 DIAGNOSIS — I509 Heart failure, unspecified: Secondary | ICD-10-CM | POA: Insufficient documentation

## 2013-09-09 DIAGNOSIS — I10 Essential (primary) hypertension: Secondary | ICD-10-CM | POA: Diagnosis not present

## 2013-09-09 DIAGNOSIS — R109 Unspecified abdominal pain: Secondary | ICD-10-CM | POA: Diagnosis not present

## 2013-09-09 DIAGNOSIS — R6889 Other general symptoms and signs: Secondary | ICD-10-CM | POA: Diagnosis not present

## 2013-09-09 DIAGNOSIS — K625 Hemorrhage of anus and rectum: Principal | ICD-10-CM | POA: Diagnosis present

## 2013-09-09 DIAGNOSIS — Z8673 Personal history of transient ischemic attack (TIA), and cerebral infarction without residual deficits: Secondary | ICD-10-CM | POA: Insufficient documentation

## 2013-09-09 DIAGNOSIS — R0989 Other specified symptoms and signs involving the circulatory and respiratory systems: Secondary | ICD-10-CM | POA: Diagnosis not present

## 2013-09-09 LAB — COMPREHENSIVE METABOLIC PANEL
ALK PHOS: 68 U/L (ref 39–117)
ALT: 7 U/L (ref 0–35)
AST: 15 U/L (ref 0–37)
Albumin: 3.6 g/dL (ref 3.5–5.2)
BILIRUBIN TOTAL: 0.5 mg/dL (ref 0.3–1.2)
BUN: 17 mg/dL (ref 6–23)
CHLORIDE: 108 meq/L (ref 96–112)
CO2: 19 mEq/L (ref 19–32)
Calcium: 9.3 mg/dL (ref 8.4–10.5)
Creatinine, Ser: 0.91 mg/dL (ref 0.50–1.10)
GFR calc non Af Amer: 57 mL/min — ABNORMAL LOW (ref >90)
GFR, EST AFRICAN AMERICAN: 66 mL/min — AB (ref >90)
GLUCOSE: 87 mg/dL (ref 70–99)
POTASSIUM: 3.8 meq/L (ref 3.7–5.3)
SODIUM: 144 meq/L (ref 137–147)
TOTAL PROTEIN: 6.8 g/dL (ref 6.0–8.3)

## 2013-09-09 LAB — URINALYSIS, ROUTINE W REFLEX MICROSCOPIC
Bilirubin Urine: NEGATIVE
Glucose, UA: NEGATIVE mg/dL
Ketones, ur: NEGATIVE mg/dL
LEUKOCYTES UA: NEGATIVE
Nitrite: NEGATIVE
Protein, ur: 30 mg/dL — AB
SPECIFIC GRAVITY, URINE: 1.014 (ref 1.005–1.030)
UROBILINOGEN UA: 0.2 mg/dL (ref 0.0–1.0)
pH: 5.5 (ref 5.0–8.0)

## 2013-09-09 LAB — CBC
HCT: 28.7 % — ABNORMAL LOW (ref 36.0–46.0)
HEMATOCRIT: 29 % — AB (ref 36.0–46.0)
HEMOGLOBIN: 9.4 g/dL — AB (ref 12.0–15.0)
HEMOGLOBIN: 9.7 g/dL — AB (ref 12.0–15.0)
MCH: 30.4 pg (ref 26.0–34.0)
MCH: 31.6 pg (ref 26.0–34.0)
MCHC: 32.4 g/dL (ref 30.0–36.0)
MCHC: 33.8 g/dL (ref 30.0–36.0)
MCV: 93.9 fL (ref 78.0–100.0)
MCV: 93.5 fL (ref 78.0–100.0)
Platelets: 172 10*3/uL (ref 150–400)
Platelets: 233 10*3/uL (ref 150–400)
RBC: 3.09 MIL/uL — AB (ref 3.87–5.11)
RBC: 3.07 MIL/uL — AB (ref 3.87–5.11)
RDW: 14.1 % (ref 11.5–15.5)
RDW: 14.2 % (ref 11.5–15.5)
WBC: 3.7 10*3/uL — AB (ref 4.0–10.5)
WBC: 4.8 10*3/uL (ref 4.0–10.5)

## 2013-09-09 LAB — TYPE AND SCREEN
ABO/RH(D): O POS
Antibody Screen: NEGATIVE

## 2013-09-09 LAB — URINE MICROSCOPIC-ADD ON

## 2013-09-09 LAB — MRSA PCR SCREENING: MRSA BY PCR: NEGATIVE

## 2013-09-09 LAB — POC OCCULT BLOOD, ED: FECAL OCCULT BLD: POSITIVE — AB

## 2013-09-09 LAB — PROTIME-INR
INR: 1.08 (ref 0.00–1.49)
PROTHROMBIN TIME: 13.8 s (ref 11.6–15.2)

## 2013-09-09 LAB — LIPASE, BLOOD: LIPASE: 15 U/L (ref 11–59)

## 2013-09-09 LAB — ABO/RH: ABO/RH(D): O POS

## 2013-09-09 MED ORDER — FERROUS SULFATE 325 (65 FE) MG PO TABS
325.0000 mg | ORAL_TABLET | Freq: Two times a day (BID) | ORAL | Status: DC
  Filled 2013-09-09: qty 1

## 2013-09-09 MED ORDER — FERROUS SULFATE 325 (65 FE) MG PO TBEC: 325.0000 mg | DELAYED_RELEASE_TABLET | Freq: Two times a day (BID) | ORAL | Status: DC

## 2013-09-09 MED ORDER — DOXYCYCLINE HYCLATE 100 MG PO TABS
100.0000 mg | ORAL_TABLET | Freq: Two times a day (BID) | ORAL | Status: DC
  Administered 2013-09-10: 100 mg via ORAL
  Filled 2013-09-09 (×3): qty 1

## 2013-09-09 MED ORDER — FUROSEMIDE 20 MG PO TABS
20.0000 mg | ORAL_TABLET | Freq: Two times a day (BID) | ORAL | Status: DC
  Administered 2013-09-10 – 2013-09-11 (×3): 20 mg via ORAL
  Filled 2013-09-09 (×5): qty 1

## 2013-09-09 MED ORDER — PANTOPRAZOLE SODIUM 40 MG PO TBEC
40.0000 mg | DELAYED_RELEASE_TABLET | Freq: Every day | ORAL | Status: DC
Start: 2013-09-09 — End: 2013-09-11
  Administered 2013-09-10 – 2013-09-11 (×3): 40 mg via ORAL
  Filled 2013-09-09 (×3): qty 1

## 2013-09-09 MED ORDER — DIPHENHYDRAMINE HCL 25 MG PO CAPS
25.0000 mg | ORAL_CAPSULE | Freq: Four times a day (QID) | ORAL | Status: DC | PRN
  Administered 2013-09-10 (×2): 25 mg via ORAL
  Filled 2013-09-09 (×2): qty 1

## 2013-09-09 MED ORDER — LOSARTAN POTASSIUM 50 MG PO TABS
50.0000 mg | ORAL_TABLET | Freq: Every day | ORAL | Status: DC
  Administered 2013-09-10 – 2013-09-11 (×3): 50 mg via ORAL
  Filled 2013-09-09 (×3): qty 1

## 2013-09-09 MED ORDER — SODIUM CHLORIDE 0.9 % IJ SOLN
3.0000 mL | Freq: Two times a day (BID) | INTRAMUSCULAR | Status: DC
  Administered 2013-09-10 (×3): 3 mL via INTRAVENOUS

## 2013-09-09 MED ORDER — DIPHENHYDRAMINE HCL 25 MG PO TABS: 25.0000 mg | ORAL_TABLET | Freq: Four times a day (QID) | ORAL | Status: DC | PRN

## 2013-09-09 MED ORDER — SODIUM CHLORIDE 0.9 % IV BOLUS (SEPSIS)
500.0000 mL | Freq: Once | INTRAVENOUS | Status: AC
  Administered 2013-09-09: 500 mL via INTRAVENOUS

## 2013-09-09 MED ORDER — ACETAMINOPHEN 325 MG PO TABS: 650.0000 mg | ORAL_TABLET | Freq: Four times a day (QID) | ORAL | Status: DC | PRN

## 2013-09-09 MED ORDER — ARTIFICIAL TEARS OP OINT
1.0000 "application " | TOPICAL_OINTMENT | Freq: Every evening | OPHTHALMIC | Status: DC | PRN
  Administered 2013-09-10 (×2): 1 via OPHTHALMIC
  Filled 2013-09-09: qty 3.5

## 2013-09-09 MED ORDER — METOPROLOL SUCCINATE ER 100 MG PO TB24: 100.0000 mg | ORAL_TABLET | Freq: Every day | ORAL | Status: DC

## 2013-09-09 MED ORDER — METOPROLOL SUCCINATE ER 100 MG PO TB24
100.0000 mg | ORAL_TABLET | Freq: Every day | ORAL | Status: DC
  Administered 2013-09-10 – 2013-09-11 (×3): 100 mg via ORAL
  Filled 2013-09-09 (×3): qty 1
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
## 2013-09-09 NOTE — ED Notes (Signed)
Unsuccessfully attempted to start an IV x 1.  Next shift RN to attempt.

## 2013-09-09 NOTE — Progress Notes (Signed)
   CARE MANAGEMENT ED NOTE 09/09/2013  Patient:  Jane [name], Jane [name]   Account Number:  [id number]  Date Initiated:  09/09/2013  Documentation initiated by:  Jackelyn Poling  Subjective/Objective Assessment:   80 yr old medicare/medicaid [place] access c/o bright red blood on toilet tissue after BM 2 days ago and constipation positive fecal occult blood     Subjective/Objective Assessment Detail:   hgb 9.4 wbc 3.7 abdominal imaging negative  Pt called [facility] staff in error  Pt confirmed with ED CM she forgot the first time to dial 9 before her sister's number Pt also informed CM sh has memory issues "I keep forgetting what I was going to tell you.  My memory is not good these days"  pt reports she lives on the [address]th floor of [place] with her sister Reports support also of her son and his wife     Action/Plan:   [facility] ED Cm called and went to assist pt to attempt to call her sister No answer   Action/Plan Detail:   Anticipated DC Date:       Status Recommendation to Physician:   Result of Recommendation:    Other ED Services  Consult Working Production assistant, radio  Other  Outpatient Services - Pt will follow up    Choice offered to / List presented to:            Status of service:  Completed, signed off  ED Comments:   ED Comments Detail:

## 2013-09-09 NOTE — ED Notes (Signed)
Pt reports that was seen by PCP recently and prescribed iron pills.

## 2013-09-09 NOTE — ED Notes (Addendum)
Per EMS patient from home for bright red blood on toilet tissue after BM 2 days ago and constipation since then, until today when she noted more bright red blood on tissue again after BM. Patient also c/o hives and itching since 09-06-13, for which she saw the PCP, PCP gave her new iron pills. Denies pain.

## 2013-09-09 NOTE — ED Provider Notes (Signed)
CSN: [id number]     Arrival date & time 09/09/13  1029 History   First MD Initiated Contact with Patient 09/09/13 1029     Chief Complaint  Patient presents with  . Rectal Bleeding     (Consider location/radiation/quality/duration/timing/severity/associated sxs/prior Treatment) Patient is a 80 y.o. female presenting with hematochezia. The history is provided by the patient.  Rectal Bleeding Quality:  Bright red Amount: mild. Duration:  2 days Timing:  Intermittent Progression:  Unchanged Chronicity:  New Context: defecation   Context: not rectal pain   Similar prior episodes: no   Relieved by:  Nothing Worsened by:  Nothing tried Ineffective treatments:  None tried Associated symptoms: no abdominal pain, no dizziness, no fever and no vomiting     Past Medical History  Diagnosis Date  . Anemia   . Anxiety   . Arthritis   . GERD (gastroesophageal reflux disease)   . Hyperlipidemia   . Hypertension   . Vitamin D deficiency   . Colon polyp     2009 colonoscopy  . Hiatal hernia   . Diverticulosis   . Aortic insufficiency     mild  . Stroke 1975  . CKD (chronic kidney disease) stage 3, GFR 30-59 ml/min   . CHF (congestive heart failure)    Past Surgical History  Procedure Laterality Date  . Appendectomy    . Abdominal hysterectomy    . Artery biopsy Left 12/16/2012    Procedure: BIOPSY TEMPORAL ARTERY;  Surgeon: Mal Misty, MD;  Location: [facility] OR;  Service: Vascular;  Laterality: Left;   Family History  Problem Relation Age of Onset  . Stroke Mother   . Hypertension Mother   . Stroke Father   . Hypertension Father   . Diabetes Sister    History  Substance Use Topics  . Smoking status: Former Smoker    Types: Cigarettes    Quit date: 07/01/1984  . Smokeless tobacco: Former Systems developer  . Alcohol Use: No   OB History   Grav Para Term Preterm Abortions TAB SAB Ect Mult Living                 Review of Systems  Constitutional: Negative for fever and fatigue.   HENT: Negative for congestion and drooling.   Eyes: Negative for pain.  Respiratory: Positive for shortness of breath. Negative for cough.   Cardiovascular: Negative for chest pain.  Gastrointestinal: Positive for hematochezia. Negative for nausea, vomiting, abdominal pain and diarrhea.  Genitourinary: Negative for dysuria and hematuria.  Musculoskeletal: Negative for back pain, gait problem and neck pain.  Skin: Positive for rash (pruritic, began 1 week ago). Negative for color change.  Neurological: Negative for dizziness and headaches.  Hematological: Negative for adenopathy.  Psychiatric/Behavioral: Negative for behavioral problems.  All other systems reviewed and are negative.      Allergies  Review of patient's allergies indicates no known allergies.  Home Medications   Current Outpatient Rx  Name  Route  Sig  Dispense  Refill  . acetaminophen (TYLENOL) 325 MG tablet   Oral   Take 2 tablets (650 mg total) by mouth every 6 (six) hours as needed for pain.   30 tablet   0   . artificial tears (LACRILUBE) OINT ophthalmic ointment   Ophthalmic   Apply 1 application to eye at bedtime and may repeat dose one time if needed. A thin layer of ointment should be applied to the exposed eyelid, taking care not to touch tip of applicator  to eyelid; if complaints of ocular dryness, notify MD   1 Tube   0   . aspirin EC 81 MG tablet   Oral   Take 81 mg by mouth daily.         . diphenhydrAMINE (BENADRYL) 25 MG tablet   Oral   Take 25 mg by mouth every 6 (six) hours as needed for itching.         . ferrous sulfate 325 (65 FE) MG EC tablet   Oral   Take 1 tablet (325 mg total) by mouth 2 (two) times daily.   60 tablet   3   . furosemide (LASIX) 20 MG tablet   Oral   Take 1 tablet (20 mg total) by mouth 2 (two) times daily.   30 tablet   3   . losartan (COZAAR) 50 MG tablet   Oral   Take 1 tablet (50 mg total) by mouth daily.   90 tablet   3   . metoprolol  (TOPROL-XL) 200 MG 24 hr tablet   Oral   Take 0.5 tablets (100 mg total) by mouth daily.   30 tablet   6   . omeprazole (PRILOSEC) 20 MG capsule   Oral   Take 1 capsule (20 mg total) by mouth daily. Will sometimes take twice daily if heartburn increases.   30 capsule   11   . Potassium Chloride ER 20 MEQ TBCR   Oral   Take 20 mEq by mouth daily.   30 tablet   3     Please discard the previous prescriptions. She nee ...    BP 154/62  Pulse 102  Temp(Src) 98.4 F (36.9 C) (Oral)  Resp 16  SpO2 98% Physical Exam  Nursing note and vitals reviewed. Constitutional: She is oriented to person, place, and time. She appears well-developed and well-nourished.  HENT:  Head: Normocephalic.  Mouth/Throat: Oropharynx is clear and moist. No oropharyngeal exudate.  Chronic ectropion of left lower eye lid.   Eyes: Conjunctivae and EOM are normal. Pupils are equal, round, and reactive to light.  Neck: Normal range of motion. Neck supple.  Cardiovascular: Regular rhythm, normal heart sounds and intact distal pulses.  Exam reveals no gallop and no friction rub.   No murmur heard. HR 102  Pulmonary/Chest: Effort normal and breath sounds normal. No respiratory distress. She has no wheezes.  Abdominal: Soft. Bowel sounds are normal. There is no tenderness. There is no rebound and no guarding.  Genitourinary:  Normal appearing external rectum.  Brown stool with small amount of pinkish blood noted.  Empty rectal vault.  Musculoskeletal: Normal range of motion. She exhibits no edema and no tenderness.  Chronic mildly erythematous appearance to left shin circumferentially.  Neurological: She is alert and oriented to person, place, and time.  Skin: Skin is warm and dry.  Faint macular papular rash noted sparingly in several locations diffusely on the body.  Psychiatric: She has a normal mood and affect. Her behavior is normal.    ED Course  Procedures (including critical care time) Labs  Review Labs Reviewed  CBC - Abnormal; Notable for the following:    WBC 3.7 (*)    RBC 3.09 (*)    Hemoglobin 9.4 (*)    HCT 29.0 (*)    All other components within normal limits  COMPREHENSIVE METABOLIC PANEL - Abnormal; Notable for the following:    GFR calc non Af Amer 57 (*)    GFR calc Af Wyvonnia Lora  66 (*)    All other components within normal limits  URINALYSIS, ROUTINE W REFLEX MICROSCOPIC - Abnormal; Notable for the following:    Hgb urine dipstick TRACE (*)    Protein, ur 30 (*)    All other components within normal limits  URINE MICROSCOPIC-ADD ON - Abnormal; Notable for the following:    Bacteria, UA FEW (*)    All other components within normal limits  POC OCCULT BLOOD, ED - Abnormal; Notable for the following:    Fecal Occult Bld POSITIVE (*)    All other components within normal limits  LIPASE, BLOOD  PROTIME-INR  TYPE AND SCREEN  ABO/RH   Imaging Review Dg Abd 2 Views  09/09/2013   CLINICAL DATA:  Rule out constipation  EXAM: ABDOMEN - 2 VIEW  COMPARISON:  None.  FINDINGS: The bowel gas pattern is normal. There is no evidence of free air. No radio-opaque calculi or other significant radiographic abnormality is seen.  IMPRESSION: Negative.   Electronically Signed   By: Franchot Gallo M.D.   On: 09/09/2013 11:36     EKG Interpretation   Date/Time:  Thursday September 09 2013 10:59:32 EDT Ventricular Rate:  97 PR Interval:  161 QRS Duration: 74 QT Interval:  346 QTC Calculation: 439 R Axis:   30 Text Interpretation:  Sinus rhythm Probable left atrial enlargement Low  voltage, precordial leads Abnormal R-wave progression, early transition  Baseline wander in lead(s) V2 No significant change since last tracing  Confirmed by Terryon Pineiro  MD, Turner Kunzman ([id number]) on 09/09/2013 11:03:16 AM      MDM   Final diagnoses:  Rectal bleeding    11:03 AM 80 y.o. female who presents with rectal bleeding. She states that she had a hard bowel movement 2 days ago and noticed some red  blood when wiping and on the stool. She had another bowel movement today with similar findings. She also notes that she began feeling mildly short of breath today. She denies any pain, fevers, vomiting, or diarrhea. She is afebrile and mildly tachycardic here heart rate of 102, her vital signs are otherwise unremarkable. Her abdomen is soft and benign. Light red blood noted on rectal exam with brown stool. Will get screening labs and 500 cc IV fluid.  Pt follows w/ IM teaching service. Given age, co-morbidities, and slight drop in Hgb, will admit for gi bleed.   Any medications given in the ED during this visit are listed below:  Medications  sodium chloride 0.9 % bolus 500 mL (0 mLs Intravenous Stopped 09/09/13 1243)        Blanchard Kelch, MD 09/09/13 1507

## 2013-09-09 NOTE — ED Notes (Signed)
Bed: HF02 Expected date:  Expected time:  Means of arrival:  Comments: ems- constipation

## 2013-09-10 ENCOUNTER — Encounter (HOSPITAL_COMMUNITY): Payer: Self-pay | Admitting: Physician Assistant

## 2013-09-10 DIAGNOSIS — D509 Iron deficiency anemia, unspecified: Secondary | ICD-10-CM

## 2013-09-10 DIAGNOSIS — I10 Essential (primary) hypertension: Secondary | ICD-10-CM | POA: Diagnosis not present

## 2013-09-10 DIAGNOSIS — K625 Hemorrhage of anus and rectum: Secondary | ICD-10-CM | POA: Diagnosis not present

## 2013-09-10 LAB — CBC
HEMATOCRIT: 27.9 % — AB (ref 36.0–46.0)
HEMATOCRIT: 27.5 % — AB (ref 36.0–46.0)
HEMOGLOBIN: 9.4 g/dL — AB (ref 12.0–15.0)
Hemoglobin: 9.2 g/dL — ABNORMAL LOW (ref 12.0–15.0)
MCH: 31.3 pg (ref 26.0–34.0)
MCH: 31.3 pg (ref 26.0–34.0)
MCHC: 33.7 g/dL (ref 30.0–36.0)
MCHC: 33.5 g/dL (ref 30.0–36.0)
MCV: 93 fL (ref 78.0–100.0)
MCV: 93.5 fL (ref 78.0–100.0)
Platelets: 174 10*3/uL (ref 150–400)
Platelets: 170 10*3/uL (ref 150–400)
RBC: 3 MIL/uL — ABNORMAL LOW (ref 3.87–5.11)
RBC: 2.94 MIL/uL — ABNORMAL LOW (ref 3.87–5.11)
RDW: 14 % (ref 11.5–15.5)
RDW: 13.9 % (ref 11.5–15.5)
WBC: 4.6 10*3/uL (ref 4.0–10.5)
WBC: 4.3 10*3/uL (ref 4.0–10.5)

## 2013-09-10 LAB — LACTIC ACID, PLASMA: Lactic Acid, Venous: 2.1 mmol/L (ref 0.5–2.2)

## 2013-09-10 LAB — TROPONIN I
Troponin I: <0.3 ng/mL (ref <0.30)
Troponin I: <0.3 ng/mL (ref <0.30)

## 2013-09-10 LAB — BASIC METABOLIC PANEL
BUN: 12 mg/dL (ref 6–23)
CHLORIDE: 109 meq/L (ref 96–112)
CO2: 21 meq/L (ref 19–32)
CREATININE: 0.85 mg/dL (ref 0.50–1.10)
Calcium: 9 mg/dL (ref 8.4–10.5)
GFR calc non Af Amer: 62 mL/min — ABNORMAL LOW (ref >90)
GFR, EST AFRICAN AMERICAN: 72 mL/min — AB (ref >90)
Glucose, Bld: 95 mg/dL (ref 70–99)
POTASSIUM: 4.1 meq/L (ref 3.7–5.3)
Sodium: 143 mEq/L (ref 137–147)

## 2013-09-10 LAB — GLUCOSE, CAPILLARY: Glucose-Capillary: 89 mg/dL (ref 70–99)

## 2013-09-10 MED ORDER — HYDROCORTISONE ACETATE 25 MG RE SUPP
25.0000 mg | Freq: Two times a day (BID) | RECTAL | Status: DC
  Administered 2013-09-10 – 2013-09-11 (×3): 25 mg via RECTAL
  Filled 2013-09-10 (×4): qty 1

## 2013-09-10 MED ORDER — CEPHALEXIN 500 MG PO CAPS
500.0000 mg | ORAL_CAPSULE | Freq: Three times a day (TID) | ORAL | Status: DC
  Filled 2013-09-10 (×4): qty 1

## 2013-09-10 MED ORDER — CEPHALEXIN 500 MG PO CAPS: 500.0000 mg | ORAL_CAPSULE | Freq: Four times a day (QID) | ORAL | Status: DC

## 2013-09-10 MED ORDER — SENNOSIDES-DOCUSATE SODIUM 8.6-50 MG PO TABS
1.0000 | ORAL_TABLET | Freq: Every day | ORAL | Status: DC
  Administered 2013-09-10: 1 via ORAL
  Filled 2013-09-10: qty 1

## 2013-09-10 NOTE — Progress Notes (Signed)
UR completed 
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
## 2013-09-10 NOTE — H&P (Signed)
Internal Medicine Attending Admission Note Date: 09/10/2013  Patient name: Jane [name] Medical record number: [id number] Date of birth: 04/07/1929 Age: 80 y.o. Gender: female  I saw and evaluated the patient. I reviewed the resident's note and I agree with the resident's findings and plan as documented in the resident's note, with the following additional comments.  Chief Complaint(s): Bright red blood per rectum  History - key components related to admission: Patient is an 80 year old woman with history of colonic diverticulosis, hemorrhoids, iron deficiency anemia, CVA, and other problems as outlined in the medical history admitted with complaint of bright red blood per rectum.  Patient noted one prior episode about one week ago of red blood noted on her toilet paper.  Yesterday she noted a large amount of blood in the toilet bowl after having a bowel movement.  She denies abdominal pain.  Patient also reports recent episodes of hives.  Physical Exam - key components related to admission:  Filed Vitals:   09/09/13 2300 09/10/13 0014 09/10/13 0400 09/10/13 0812  BP: 136/58 141/57 140/68 134/56  Pulse: 103 96 90 77  Temp: 97.9 F (36.6 C)  98.1 F (36.7 C) 97.6 F (36.4 C)  TempSrc: Oral  Oral Oral  Resp: 16  18 18   Height:      Weight:      SpO2: 98%  99% 99%   General: Alert, no distress Lungs: Clear Heart: Regular; no extra sounds or murmurs Abdomen: Bowel sounds present, soft, nontender Extremities: No edema  Lab results:   Basic Metabolic Panel:  Recent Labs  09/09/13 1138 09/10/13 0935  NA 144 143  K 3.8 4.1  CL 108 109  CO2 19 21  GLUCOSE 87 95  BUN 17 12  CREATININE 0.91 0.85  CALCIUM 9.3 9.0    Liver Function Tests:  Recent Labs  09/09/13 1138  AST 15  ALT 7  ALKPHOS 68  BILITOT 0.5  PROT 6.8  ALBUMIN 3.6    Recent Labs  09/09/13 1138  LIPASE 15     CBC:  Recent Labs  09/09/13 2321 09/10/13 0935  WBC 4.8 4.6  HGB 9.7* 9.4*   HCT 28.7* 27.9*  MCV 93.5 93.0  PLT 233 174     Cardiac Enzymes:  Recent Labs  09/09/13 2150 09/10/13 0225 09/10/13 0935  TROPONINI <0.30 <0.30 <0.30     CBG:  Recent Labs  09/10/13 0816  GLUCAP 89    Coagulation:  Recent Labs  09/09/13 1138  INR 1.08        Urinalysis    Component Value Date/Time   COLORURINE YELLOW 09/09/2013 1229   APPEARANCEUR CLEAR 09/09/2013 1229   LABSPEC 1.014 09/09/2013 1229   PHURINE 5.5 09/09/2013 1229   GLUCOSEU NEGATIVE 09/09/2013 1229   HGBUR TRACE* 09/09/2013 1229   BILIRUBINUR NEGATIVE 09/09/2013 1229   KETONESUR NEGATIVE 09/09/2013 1229   PROTEINUR 30* 09/09/2013 1229   UROBILINOGEN 0.2 09/09/2013 1229   NITRITE NEGATIVE 09/09/2013 1229   LEUKOCYTESUR NEGATIVE 09/09/2013 1229    Urine microscopic:  Recent Labs  09/09/13 1229  EPIU RARE  WBCU 7-10  RBCU 0-2  BACTERIA FEW*    Imaging results:  X-ray Chest Pa And Lateral   09/10/2013   CLINICAL DATA:  Shortness of breath.  EXAM: CHEST  2 VIEW  COMPARISON:  February 06, 2013.  FINDINGS: Stable cardiomediastinal silhouette. No pleural effusion or pneumothorax is noted. No acute pulmonary disease is noted. Stable mild central pulmonary vascular congestion. Narrowing of the  right acromial humeral space is noted suggesting rotator cuff injury.  IMPRESSION: Stable mild central pulmonary vascular congestion. No other significant abnormality seen.   Electronically Signed   By: Sabino Dick M.D.   On: 09/10/2013 03:04   Dg Abd 2 Views  09/09/2013   CLINICAL DATA:  Rule out constipation  EXAM: ABDOMEN - 2 VIEW  COMPARISON:  None.  FINDINGS: The bowel gas pattern is normal. There is no evidence of free air. No radio-opaque calculi or other significant radiographic abnormality is seen.  IMPRESSION: Negative.   Electronically Signed   By: Franchot Gallo M.D.   On: 09/09/2013 11:36    Other results: EKG: Sinus rhythm; probable left atrial enlargement; low voltage, precordial leads; abnormal  R-wave progression, early transition; baseline wander in lead(s) V2; no significant change since last tracing  Assessment & Plan by Problem:  1.  Hematochezia.  Patient has a history of multiple colonic polyps, diverticulosis, and internal hemorrhoids noted on colonoscopy in 2009; I do not see a pathology result from the colonoscopy.  Her admission hemoglobin of 9.4 is down from prior value of 10.8 in August of 2014; her hemoglobin has remained stable since hospitalization, and she does not have signs of active GI bleeding today.  Plans include monitor; follow hemoglobin; GI consult.    2.  Urticaria.  Patient has intermittent episodes of urticaria; the cause is not clear.  She has had no symptoms or signs of angioedema or anaphylaxis.  The history does not point toward a specific exposure as the cause.  She responded well to symptomatic treatment with an antihistamine.  Plan is continue antihistamine as needed; add H2 blocker; if symptoms persist, then outpatient referral to an allergist could be considered.  3.  Other problems and plans as per the resident physician's note.

## 2013-09-10 NOTE — Consult Note (Signed)
[facility] Consult: 9:16 AM 09/10/2013  LOS: 1 day    Referring Provider: Dr Stann Mainland and Marinda Elk of teaching service Primary Care Physician:  Dorian Heckle, MD Primary Gastroenterologist:  Dr. Verl Blalock    Reason for Consultation:  Rectal bleeding   HPI: Jane [name] is a 80 y.o. female. Hx diastolic heart failure.  Hx chronic anemia, on Iron daily. 2009 EGD and colonoscopy with benign gastric polyp, GERD, HH, small colon polyps, diverticulosis and hemorrrhoids. Takes 81 mg ASA Prilosec daily.   Admitted yesterday after occurrences of BRBPR on tissue noted after BM.  Initially minor amount on 3/9 but larger amount on 3/13. Still just saw small amount of blood in commode and more blood on wiping.  This was associated with straining and hard stools.  No abd pain, nausea, dyspnea, chest pain.  Baseline Hgb of 10 to 11 in summer 2014.  Hgb on admission 9.4.  MCV normal.  BUN normal. INR normal.  Ran out of po Iron several weeks ago.  Just restarted po Iron Sulfate 325 bid (compared to ferrous gluconate 256 mg daily)2/9.  In interim of running out of iron had hives in various body locations.  On detailed questioning not clear the hives started until after she had run out of iron for a few weeks.      Past Medical History  Diagnosis Date  . Anemia   . Anxiety   . Arthritis   . GERD (gastroesophageal reflux disease)   . Hyperlipidemia   . Hypertension   . Vitamin D deficiency   . Colon polyp     2009 colonoscopy  . Hiatal hernia   . Diverticulosis   . Aortic insufficiency     mild  . Stroke 1975  . CKD (chronic kidney disease) stage 3, GFR 30-59 ml/min   . CHF (congestive heart failure)     Past Surgical History  Procedure Laterality Date  . Appendectomy    . Abdominal hysterectomy    .  Artery biopsy Left 12/16/2012    Procedure: BIOPSY TEMPORAL ARTERY;  Surgeon: Mal Misty, MD;  Location: [place];  Service: Vascular;  Laterality: Left;    Prior to Admission medications   Medication Sig Start Date End Date Taking? Authorizing Provider  acetaminophen (TYLENOL) 325 MG tablet Take 2 tablets (650 mg total) by mouth every 6 (six) hours as needed for pain. 01/25/13  Yes Houston Siren III, MD  artificial tears (LACRILUBE) OINT ophthalmic ointment Apply 1 application to eye at bedtime and may repeat dose one time if needed. A thin layer of ointment should be applied to the exposed eyelid, taking care not to touch tip of applicator to eyelid; if complaints of ocular dryness, notify MD 12/17/12  Yes Jerene Pitch, MD  aspirin EC 81 MG tablet Take 81 mg by mouth daily.   Yes Historical Provider, MD  diphenhydrAMINE (BENADRYL) 25 MG tablet Take 25 mg by mouth every 6 (six) hours as needed for itching.   Yes Historical Provider, MD  ferrous sulfate 325 (65 FE)  MG EC tablet Take 1 tablet (325 mg total) by mouth 2 (two) times daily. 08/09/13 08/09/14 Yes Jeralene Huff, MD  furosemide (LASIX) 20 MG tablet Take 1 tablet (20 mg total) by mouth 2 (two) times daily. 03/03/13 03/03/14 Yes Ivor Costa, MD  losartan (COZAAR) 50 MG tablet Take 1 tablet (50 mg total) by mouth daily. 09/18/12  Yes Jerene Pitch, MD  metoprolol (TOPROL-XL) 200 MG 24 hr tablet Take 0.5 tablets (100 mg total) by mouth daily. 12/21/12  Yes Jeralene Huff, MD  omeprazole (PRILOSEC) 20 MG capsule Take 1 capsule (20 mg total) by mouth daily. Will sometimes take twice daily if heartburn increases. 02/03/13  Yes Bartholomew Crews, MD  Potassium Chloride ER 20 MEQ TBCR Take 20 mEq by mouth daily. 06/03/13  Yes Jeralene Huff, MD    Scheduled Meds: . artificial tears  1 application Left Eye QHS,MR X 1  . furosemide  20 mg Oral BID  . losartan  50 mg Oral Daily  . metoprolol succinate  100 mg Oral Daily  .  pantoprazole  40 mg Oral Daily  . sodium chloride  3 mL Intravenous Q12H   Infusions:   PRN Meds: acetaminophen, diphenhydrAMINE   Allergies as of 09/09/2013  . (No Known Allergies)    Family History  Problem Relation Age of Onset  . Stroke Mother   . Hypertension Mother   . Stroke Father   . Hypertension Father   . Diabetes Sister     History   Social History  . Marital Status: Widowed    Spouse Name: N/A    Number of Children: N/A  . Years of Education: N/A   Occupational History  . Not on file.   Social History Main Topics  . Smoking status: Former Smoker    Types: Cigarettes    Quit date: 07/01/1984  . Smokeless tobacco: Former Systems developer  . Alcohol Use: No  . Drug Use: No  . Sexual Activity: No   Other Topics Concern  . Not on file   Social History Narrative  . No narrative on file    REVIEW OF SYSTEMS: Constitutional:  No new weakness, no weight loss ENT:  No nose bleeds Pulm:  No new dyspnea, able to walk in her appartment and do limited shopping CV:  No palpitations, no LE edema.  GU:  No hematuria, no frequency GI:  Per HPI Heme:  Per HPI   Transfusions:  None to her knowledge Neuro:  No headaches, no peripheral tingling or numbness Derm:  Pruritic rash/hives started after running out of Iron in 08/2013. On arms, thighs and chest  Endocrine:  No sweats or chills.  No polyuria or dysuria Immunization:  Up to date flu, pneumo.  Travel:  None beyond local counties in last few months.    PHYSICAL EXAM: Vital signs in last 24 hours: Filed Vitals:   09/10/13 0812  BP: 134/56  Pulse: 77  Temp: 97.6 F (36.4 C)  Resp: 18   Wt Readings from Last 3 Encounters:  09/09/13 78 kg (171 lb 15.3 oz)  08/09/13 78.79 kg (173 lb 11.2 oz)  03/11/13 80.332 kg (177 lb 1.6 oz)   General: comfortable, looks aged Head:  No asymmetry  Eyes:  Ectropion of lower right lid, erythmatous and dry Ears:  Slight HOH  Nose:  No discharge or swallowing Mouth:  Moist,  clear MM.  No blood Neck:  No mass or TMG Lungs:  diminished Heart: RRR Abdomen:  Soft, no mass, no HSM.   Rectal: external hemorrhoidal tags, no plapable masses.  Small blood on glove.  No stool   Musc/Skeltl: arthritis in hands Extremities:  No CCE Derm:  Extreme overgrowth of toenails with onychomycosis.   Neurologic:  Oriented x 3, slow speech pattern but accurate.  Moves 4 limbs, sits up without assistance Skin:  Hives on thighs Tattoos:  none Nodes:  No inguinal adenopathy   Psych:  Cooperative , relaxed.   Intake/Output from previous day: 03/12 0701 - 03/13 0700 In: 3 [I.V.:3] Out: -  Intake/Output this shift:    LAB RESULTS:  Recent Labs  09/09/13 1138 09/09/13 2321  WBC 3.7* 4.8  HGB 9.4* 9.7*  HCT 29.0* 28.7*  PLT 172 233   BMET Lab Results  Component Value Date   NA 144 09/09/2013   NA 141 03/11/2013   NA 144 03/03/2013   K 3.8 09/09/2013   K 3.2* 03/11/2013   K 3.7 03/03/2013   CL 108 09/09/2013   CL 105 03/11/2013   CL 110 03/03/2013   CO2 19 09/09/2013   CO2 23 03/11/2013   CO2 24 03/03/2013   GLUCOSE 87 09/09/2013   GLUCOSE 101* 03/11/2013   GLUCOSE 77 03/03/2013   BUN 17 09/09/2013   BUN 23 03/11/2013   BUN 12 03/03/2013   CREATININE 0.91 09/09/2013   CREATININE 0.90 03/11/2013   CREATININE 0.88 03/03/2013   CALCIUM 9.3 09/09/2013   CALCIUM 8.9 03/11/2013   CALCIUM 9.1 03/03/2013   LFT  Recent Labs  09/09/13 1138  PROT 6.8  ALBUMIN 3.6  AST 15  ALT 7  ALKPHOS 68  BILITOT 0.5   PT/INR Lab Results  Component Value Date   INR 1.08 09/09/2013   INR 1.04 02/06/2013   INR 1.03 12/16/2012   Hepatitis Panel No results found for this basename: HEPBSAG, HCVAB, HEPAIGM, HEPBIGM,  in the last 72 hours C-Diff No components found with this basename: cdiff   Lipase     Component Value Date/Time   LIPASE 15 09/09/2013 1138    Drugs of Abuse  No results found for this basename: labopia, cocainscrnur, labbenz, amphetmu, thcu, labbarb     RADIOLOGY  STUDIES: X-ray Chest Pa And Lateral   09/10/2013   CLINICAL DATA:  Shortness of breath.  EXAM: CHEST  2 VIEW  COMPARISON:  February 06, 2013.  FINDINGS: Stable cardiomediastinal silhouette. No pleural effusion or pneumothorax is noted. No acute pulmonary disease is noted. Stable mild central pulmonary vascular congestion. Narrowing of the right acromial humeral space is noted suggesting rotator cuff injury.  IMPRESSION: Stable mild central pulmonary vascular congestion. No other significant abnormality seen.   Electronically Signed   By: Sabino Dick M.D.   On: 09/10/2013 03:04   Dg Abd 2 Views  09/09/2013   CLINICAL DATA:  Rule out constipation  EXAM: ABDOMEN - 2 VIEW  COMPARISON:  None.  FINDINGS: The bowel gas pattern is normal. There is no evidence of free air. No radio-opaque calculi or other significant radiographic abnormality is seen.  IMPRESSION: Negative.   Electronically Signed   By: Franchot Gallo M.D.   On: 09/09/2013 11:36    ENDOSCOPIC STUDIES: 08/2007  Dr  Sharlett Iles For anemia EGD: HH, chronic GERD, gastric polyp Colon:  Transverse colon polyps not retrieved, diverticulosis, int rrhoids.  Pathology: STOMACH, BIOPSY: CHRONIC, ACTIVE GASTRITIS WITH HELICOBACTER PYLORI. NO INTESTINAL METAPLASIA OR MALIGNANCY IDENTIFIED.    IMPRESSION:   *  Minor to moderate, limited occurrences  of BRB per rectum.  Most c/w hemorrhoids in setting of hard stools, straining Hx benign gastric polyps, GERD/HH, colon polyps, diverticulosis and hemorrhoids.   *  Normocytic anemia.   *  Hives.  Not convinced these have anything to do with her having run out of po Iron.(now restarted and may be causing some of the hard stool     PLAN:     *  Stool softerner, laxative, hemorrhoid care.    Jane [name]  09/10/2013, 9:16 AM Pager: [phone] Attending MD note:   I have taken a history, examined the patient, and reviewed the chart. I agree with the Advanced Practitioner's impression and  recommendations. Pt with low volume hematochezia likely related to difficult bowl movement. Local rectal care recommended. Please call prn  [name] Pager # [phone]

## 2013-09-10 NOTE — Progress Notes (Signed)
Pt. Was complaining of being itchy. I noticed her raised rash had spread to her thighs, chest and buttocks. She was give 25mg  of benadryl and the dr was paged. Dr. Redmond Pulling came to 2C17 to see the rash. No new orders at this time. Will continue to monitor.

## 2013-09-11 LAB — CBC
HCT: 27.1 % — ABNORMAL LOW (ref 36.0–46.0)
HEMOGLOBIN: 9.2 g/dL — AB (ref 12.0–15.0)
MCH: 31.3 pg (ref 26.0–34.0)
MCHC: 33.9 g/dL (ref 30.0–36.0)
MCV: 92.2 fL (ref 78.0–100.0)
PLATELETS: 161 10*3/uL (ref 150–400)
RBC: 2.94 MIL/uL — ABNORMAL LOW (ref 3.87–5.11)
RDW: 13.8 % (ref 11.5–15.5)
WBC: 4 10*3/uL (ref 4.0–10.5)

## 2013-09-11 LAB — GLUCOSE, CAPILLARY: GLUCOSE-CAPILLARY: 89 mg/dL (ref 70–99)

## 2013-09-11 MED ORDER — SENNOSIDES-DOCUSATE SODIUM 8.6-50 MG PO TABS: 1.0000 | ORAL_TABLET | Freq: Every day | ORAL | Status: DC

## 2013-09-11 MED ORDER — HYDROCORTISONE ACETATE 25 MG RE SUPP: 25.0000 mg | Freq: Two times a day (BID) | RECTAL | Status: DC
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
## 2013-09-11 NOTE — Discharge Planning (Signed)
Patient discharged home in stable condition. Verbalizes understanding of all discharge instructions, including home medications and follow up appointments. 

## 2013-09-16 LAB — CULTURE, BLOOD (ROUTINE X 2)
CULTURE: NO GROWTH
Culture: NO GROWTH

## 2013-09-20 ENCOUNTER — Ambulatory Visit: Payer: Medicare Other | Admitting: Internal Medicine

## 2013-09-27 ENCOUNTER — Ambulatory Visit: Payer: Medicare Other | Admitting: Internal Medicine

## 2013-09-29 ENCOUNTER — Ambulatory Visit: Payer: Medicare Other | Admitting: Internal Medicine

## 2013-10-08 ENCOUNTER — Encounter: Payer: Self-pay | Admitting: *Deleted

## 2013-10-08 ENCOUNTER — Telehealth: Payer: Self-pay | Admitting: *Deleted

## 2013-10-08 NOTE — Progress Notes (Signed)
Patient ID: Jane [name], female   DOB: 1929/03/06, 80 y.o.   MRN: [id number]

## 2013-10-08 NOTE — Telephone Encounter (Signed)
[facility] listed as pt's Medicaid [place] PCP - they approved the use of their NPI approval # for one use : [id number]. Pt told [place], [place], today on phone that original referral reason and need for [facility] NPI number no longer an issue so am documenting the [facility] NPI # in case it is needed for another referral. Yvonna Alanis RN, 10/08/13, 2:09P

## 2013-10-15 ENCOUNTER — Encounter: Payer: Self-pay | Admitting: Internal Medicine

## 2013-10-15 ENCOUNTER — Ambulatory Visit (INDEPENDENT_AMBULATORY_CARE_PROVIDER_SITE_OTHER): Payer: Medicare Other | Admitting: Internal Medicine

## 2013-10-15 VITALS — BP 170/74 | HR 89 | Temp 98.3°F | Ht 65.0 in | Wt 173.0 lb

## 2013-10-15 DIAGNOSIS — H02105 Unspecified ectropion of left lower eyelid: Secondary | ICD-10-CM

## 2013-10-15 DIAGNOSIS — L509 Urticaria, unspecified: Secondary | ICD-10-CM

## 2013-10-15 DIAGNOSIS — K625 Hemorrhage of anus and rectum: Secondary | ICD-10-CM | POA: Diagnosis not present

## 2013-10-15 DIAGNOSIS — R21 Rash and other nonspecific skin eruption: Secondary | ICD-10-CM | POA: Diagnosis not present

## 2013-10-15 DIAGNOSIS — H02109 Unspecified ectropion of unspecified eye, unspecified eyelid: Secondary | ICD-10-CM | POA: Diagnosis not present

## 2013-10-15 DIAGNOSIS — I509 Heart failure, unspecified: Secondary | ICD-10-CM | POA: Diagnosis not present

## 2013-10-15 DIAGNOSIS — M79609 Pain in unspecified limb: Secondary | ICD-10-CM | POA: Diagnosis not present

## 2013-10-15 MED ORDER — LORATADINE 10 MG PO TABS: 10.0000 mg | ORAL_TABLET | Freq: Every day | ORAL | Status: DC

## 2013-10-15 MED ORDER — DIPHENHYDRAMINE HCL 25 MG PO TABS: 25.0000 mg | ORAL_TABLET | Freq: Every evening | ORAL | Status: DC | PRN

## 2013-10-15 MED ORDER — DIPHENHYDRAMINE-ZINC ACETATE 1-0.1 % EX CREA: TOPICAL_CREAM | Freq: Three times a day (TID) | CUTANEOUS | Status: DC | PRN

## 2013-10-15 NOTE — Patient Instructions (Signed)
Thank you for your visit. - Please take Benadryl 25 mg at night before bedtime. You may repeat the dose one time again if needed. I do not want you taking this medicine during the day as it can be quite sedating. - I have prescribed Benadryl cream to treat your hives and itching during the day. - Please start taking Claritin daily. This is an antihistamine, antiallergy medicine. - When your Medicaid card comes through, we can refer you to a dermatologist and an ophthalmologist for your eye. - Continue your eye drops daily. - Continue to monitor for signs of blood per rectum. - Please return to see me in one month. At that time we will do the referrals.

## 2013-10-15 NOTE — Progress Notes (Signed)
I have reviewed presenting complaints, physical findings, and medications with resident physician Dr Lesly Dukes and concur with management.  Symptomatic Rx of idiopathic urticaria pending Dermatology referral. Murriel Hopper, MD, FACP  Hematology-Oncology/Internal Medicine

## 2013-10-15 NOTE — Assessment & Plan Note (Signed)
Patient reports resolution of her rectal bleeding. She will continue to monitor her stools. - Needs referral to GI for colonoscopy when Medicaid card is fixed

## 2013-10-15 NOTE — Progress Notes (Signed)
Subjective:    Patient ID: Jane [name], female    DOB: Jan 15, 1929, 80 y.o.   MRN: [id number]  HPI  Jane [name] is an 80 year old woman with a PMH of HFwPEF , HTN, Fe-deficiency anemia and hemorrhoids who presents for hospital followup.  Patient was admitted from 09/09/2013 to 09/11/2013 with bright red blood per rectum after constipated bowel movements. This is felt to represent hemorrhoidal bleeding versus diverticulosis as her colonoscopy in 2009 showed evidence of both. GI was consult to and recommended stool softener, laxative, hemorrhoid care. Hemoglobin was stable. GI recommended an outpatient colonoscopy. She had urticaria of her legs abdomen and chest at that time that resolved with 1 dose of Benadryl. No evidence of systemic allergic reaction.  Today, she feels well. She has had no more blood per rectum since her admission. She has been using her laxative and having bowel movements every other day. They're normal color and normal caliber.  She is still having issues with the hives. Benadryl seems to help, but it makes her sleepy. This is been going on for several months. She denies any change in detergents or cleanser. She uses [employer]. No clear temporal relationship, sometimes the hives are present when she wakes up in the morning and other times they're not. No other affected household members, but she lives alone. She has not seen any insects. She uses rubbing alcohol on the welts to help with the itching. Dr. Michail Sermon has tried to refer her to dermatology in the past.   Unfortunately, the patient's Medicaid card has wrong PCP on it, so we are unable to do referrals at this time. We are in the process of doing the paperwork to fix this problem.  She still has ectropion of left lower eyelid but she thinks it's improved with eyedrops.   Current Outpatient Prescriptions on File Prior to Visit  Medication Sig Dispense Refill  . artificial tears (LACRILUBE) OINT ophthalmic  ointment Apply 1 application to eye at bedtime and may repeat dose one time if needed. A thin layer of ointment should be applied to the exposed eyelid, taking care not to touch tip of applicator to eyelid; if complaints of ocular dryness, notify MD  1 Tube  0  . aspirin EC 81 MG tablet Take 81 mg by mouth daily.      . ferrous sulfate 325 (65 FE) MG EC tablet Take 1 tablet (325 mg total) by mouth 2 (two) times daily.  60 tablet  3  . furosemide (LASIX) 20 MG tablet Take 1 tablet (20 mg total) by mouth 2 (two) times daily.  30 tablet  3  . losartan (COZAAR) 50 MG tablet Take 1 tablet (50 mg total) by mouth daily.  90 tablet  3  . metoprolol (TOPROL-XL) 200 MG 24 hr tablet Take 0.5 tablets (100 mg total) by mouth daily.  30 tablet  6  . omeprazole (PRILOSEC) 20 MG capsule Take 1 capsule (20 mg total) by mouth daily. Will sometimes take twice daily if heartburn increases.  30 capsule  11  . Potassium Chloride ER 20 MEQ TBCR Take 20 mEq by mouth daily.  30 tablet  3  . senna-docusate (SENOKOT-S) 8.6-50 MG per tablet Take 1 tablet by mouth at bedtime.  30 tablet  12  . acetaminophen (TYLENOL) 325 MG tablet Take 2 tablets (650 mg total) by mouth every 6 (six) hours as needed for pain.  30 tablet  0    Review of  Systems  Constitutional: Negative for fever and chills.  HENT: Negative for rhinorrhea.   Eyes: Negative for pain, redness and visual disturbance.  Respiratory: Negative for cough, chest tightness and shortness of breath.   Genitourinary: Negative for dysuria.  Musculoskeletal: Negative for arthralgias.  Skin: Positive for rash.  Neurological: Negative for dizziness and headaches.       Objective:   Physical Exam  Constitutional: She is oriented to person, place, and time. She appears well-developed and well-nourished.  HENT:  Head: Normocephalic and atraumatic.  Eyes: Pupils are equal, round, and reactive to light.  Ectropion of left lower eyelid  Neck: Normal range of motion. Neck  supple.  Cardiovascular: Normal rate and regular rhythm.  Exam reveals no gallop and no friction rub.   No murmur heard. Pulmonary/Chest: Effort normal and breath sounds normal. No respiratory distress. She has no wheezes. She has no rales. She exhibits no tenderness.  Musculoskeletal: Normal range of motion. She exhibits no edema and no tenderness.  Neurological: She is alert and oriented to person, place, and time. No cranial nerve deficit.  Skin: Rash (Palpable splotchy mildly erythematous plaques on face and right upper chest) noted.  Psychiatric: She has a normal mood and affect.    Filed Vitals:   10/15/13 1356  BP: 170/74  Pulse: 89  Temp: 98.3 F (36.8 C)       Assessment & Plan:   Please see problem based charting.

## 2013-10-15 NOTE — Assessment & Plan Note (Signed)
Still present, but improved per the patient. She is still using eyedrops and ointment. - Needs referral to ophthalmology once Medicaid card is fixed

## 2013-10-15 NOTE — Assessment & Plan Note (Addendum)
Unclear etiology. Could be an allergic reaction to something in her environment, she denies nasal congestion or rhinorrhea but does have some increased lacrimation. Rash seems to respond well to Benadryl. Insect bites are possible as well (bed bugs?) but she has not seen any obvious pests. No clear drug that could be responsible on her med list. - Prescribed Benadryl 25 mg at bedtime, patient was instructed not to take this during the day due to the risk of sedation - Prescribed Benadryl cream to be applied as needed to the hives for symptomatically - Prescribed Claritin 10 mg daily as an antihistamine - Discouraged the patient from using rubbing alcohol as this can dry her skin out - If no resolution, could benefit from a dermatology referral once her Medicaid card is fixed

## 2013-11-15 ENCOUNTER — Ambulatory Visit: Payer: Medicare Other | Admitting: Internal Medicine

## 2013-11-19 ENCOUNTER — Other Ambulatory Visit: Payer: Self-pay | Admitting: Internal Medicine

## 2013-11-19 ENCOUNTER — Ambulatory Visit: Payer: Medicare Other | Admitting: Internal Medicine

## 2014-01-19 ENCOUNTER — Ambulatory Visit: Payer: Medicare Other | Admitting: Internal Medicine

## 2014-01-20 ENCOUNTER — Ambulatory Visit (INDEPENDENT_AMBULATORY_CARE_PROVIDER_SITE_OTHER): Payer: Medicare Other | Admitting: Internal Medicine

## 2014-01-20 ENCOUNTER — Encounter: Payer: Self-pay | Admitting: Internal Medicine

## 2014-01-20 VITALS — BP 162/78 | HR 78 | Temp 98.9°F | Ht 65.0 in | Wt 169.8 lb

## 2014-01-20 DIAGNOSIS — I10 Essential (primary) hypertension: Secondary | ICD-10-CM

## 2014-01-20 DIAGNOSIS — M79609 Pain in unspecified limb: Secondary | ICD-10-CM | POA: Diagnosis not present

## 2014-01-20 DIAGNOSIS — I509 Heart failure, unspecified: Secondary | ICD-10-CM | POA: Diagnosis not present

## 2014-01-20 MED ORDER — FUROSEMIDE 20 MG PO TABS: 20.0000 mg | ORAL_TABLET | Freq: Two times a day (BID) | ORAL | Status: DC

## 2014-01-20 MED ORDER — METOPROLOL SUCCINATE ER 200 MG PO TB24: 100.0000 mg | ORAL_TABLET | Freq: Every day | ORAL | Status: DC

## 2014-01-20 NOTE — Progress Notes (Signed)
Subjective:   Patient ID: Jane [name] female   DOB: 04-05-1929 80 y.o.   MRN: [id number]  HPI: Jane [name] is a 80 y.o. woman with PMH as mentioned below comes to the office for the refill of medications.  Patient is requesting refill on lasix and metoprolol.   She denies any other complaints.  Past Medical History  Diagnosis Date  . Anemia   . Anxiety   . Arthritis   . GERD (gastroesophageal reflux disease) 2009    EGD with benign gastric polyp too  . Hyperlipidemia   . Hypertension   . Vitamin D deficiency   . Colon polyp     2009 colonoscopy, not retrieved for pathology  . Hiatal hernia   . Diverticulosis 2009  . Aortic insufficiency     mild  . Stroke 1975  . CKD (chronic kidney disease) stage 3, GFR 30-59 ml/min   . CHF (congestive heart failure)    Current Outpatient Prescriptions  Medication Sig Dispense Refill  . acetaminophen (TYLENOL) 325 MG tablet Take 2 tablets (650 mg total) by mouth every 6 (six) hours as needed for pain.  30 tablet  0  . artificial tears (LACRILUBE) OINT ophthalmic ointment Apply 1 application to eye at bedtime and may repeat dose one time if needed. A thin layer of ointment should be applied to the exposed eyelid, taking care not to touch tip of applicator to eyelid; if complaints of ocular dryness, notify MD  1 Tube  0  . aspirin EC 81 MG tablet Take 81 mg by mouth daily.      . diphenhydrAMINE (BENADRYL) 25 MG tablet Take 1 tablet (25 mg total) by mouth at bedtime and may repeat dose one time if needed.  30 tablet  1  . diphenhydrAMINE-zinc acetate (BENADRYL) cream Apply topically 3 (three) times daily as needed for itching.  28.3 g  0  . ferrous sulfate 325 (65 FE) MG EC tablet Take 1 tablet (325 mg total) by mouth 2 (two) times daily.  60 tablet  3  . furosemide (LASIX) 20 MG tablet Take 1 tablet (20 mg total) by mouth 2 (two) times daily.  60 tablet  3  . loratadine (CLARITIN) 10 MG tablet Take 1 tablet (10 mg total) by mouth  daily.  30 tablet  1  . losartan (COZAAR) 50 MG tablet take 1 tablet by mouth once daily  90 tablet  3  . metoprolol (TOPROL-XL) 200 MG 24 hr tablet Take 0.5 tablets (100 mg total) by mouth daily.  30 tablet  6  . omeprazole (PRILOSEC) 20 MG capsule Take 1 capsule (20 mg total) by mouth daily. Will sometimes take twice daily if heartburn increases.  30 capsule  11  . Potassium Chloride ER 20 MEQ TBCR Take 20 mEq by mouth daily.  30 tablet  3  . senna-docusate (SENOKOT-S) 8.6-50 MG per tablet Take 1 tablet by mouth at bedtime.  30 tablet  12   No current facility-administered medications for this visit.   Family History  Problem Relation Age of Onset  . Stroke Mother   . Hypertension Mother   . Stroke Father   . Hypertension Father   . Diabetes Sister    History   Social History  . Marital Status: Widowed    Spouse Name: N/A    Number of Children: N/A  . Years of Education: N/A   Social History Main Topics  . Smoking status: Former Smoker  Types: Cigarettes    Quit date: 07/01/1984  . Smokeless tobacco: Former Systems developer  . Alcohol Use: No  . Drug Use: No  . Sexual Activity: No   Other Topics Concern  . None   Social History Narrative  . None   Review of Systems: Pertinent items are noted in HPI. Objective:  Physical Exam: Filed Vitals:   01/20/14 1555  BP: 162/78  Pulse: 78  Temp: 98.9 F (37.2 C)  TempSrc: Oral  Height: 5\' 5"  (1.651 m)  Weight: 169 lb 12.8 oz (77.021 kg)  SpO2: 97%   Constitutional: Vital signs reviewed.  Patient is a well-developed and well-nourished and is in no acute distress and cooperative with exam.   Cardiovascular: RRR, S1 normal, S2 normal Pulmonary/Chest: normal respiratory effort, CTAB, no wheezes, rales, or rhonchi Neurological: A&O x3  Assessment & Plan:

## 2014-01-20 NOTE — Patient Instructions (Signed)
Take all the medications as advised below.

## 2014-01-20 NOTE — Assessment & Plan Note (Signed)
Slightly elevated. Patient ran out of her lasix 2 days ago.  Plans: Refills on lasix and metoprolol sent to the pharmacy.

## 2014-01-20 NOTE — Progress Notes (Signed)
Case discussed with Dr. Eyvonne Mechanic soon after the resident saw the patient.  We reviewed the resident's history and exam and pertinent patient test results.  I agree with the assessment, diagnosis, and plan of care documented in the resident's note.

## 2014-02-07 ENCOUNTER — Other Ambulatory Visit: Payer: Self-pay | Admitting: Internal Medicine

## 2014-03-30 ENCOUNTER — Encounter: Payer: Medicare Other | Admitting: Internal Medicine

## 2014-04-01 ENCOUNTER — Emergency Department (HOSPITAL_COMMUNITY): Payer: Medicare Other

## 2014-04-01 ENCOUNTER — Encounter (HOSPITAL_COMMUNITY): Payer: Self-pay | Admitting: Emergency Medicine

## 2014-04-01 ENCOUNTER — Emergency Department (HOSPITAL_COMMUNITY)
Admission: EM | Admit: 2014-04-01 | Discharge: 2014-04-01 | Disposition: A | Discharge status: Home / Self Care | Payer: Medicare Other | Attending: Emergency Medicine | Admitting: Emergency Medicine

## 2014-04-01 DIAGNOSIS — Z7982 Long term (current) use of aspirin: Secondary | ICD-10-CM | POA: Insufficient documentation

## 2014-04-01 DIAGNOSIS — Z8659 Personal history of other mental and behavioral disorders: Secondary | ICD-10-CM | POA: Insufficient documentation

## 2014-04-01 DIAGNOSIS — D649 Anemia, unspecified: Secondary | ICD-10-CM | POA: Insufficient documentation

## 2014-04-01 DIAGNOSIS — M546 Pain in thoracic spine: Secondary | ICD-10-CM | POA: Insufficient documentation

## 2014-04-01 DIAGNOSIS — R079 Chest pain, unspecified: Secondary | ICD-10-CM | POA: Diagnosis not present

## 2014-04-01 DIAGNOSIS — Z8673 Personal history of transient ischemic attack (TIA), and cerebral infarction without residual deficits: Secondary | ICD-10-CM | POA: Diagnosis not present

## 2014-04-01 DIAGNOSIS — M549 Dorsalgia, unspecified: Secondary | ICD-10-CM | POA: Diagnosis not present

## 2014-04-01 DIAGNOSIS — N183 Chronic kidney disease, stage 3 (moderate): Secondary | ICD-10-CM | POA: Insufficient documentation

## 2014-04-01 DIAGNOSIS — K219 Gastro-esophageal reflux disease without esophagitis: Secondary | ICD-10-CM | POA: Insufficient documentation

## 2014-04-01 DIAGNOSIS — M545 Low back pain: Secondary | ICD-10-CM | POA: Diagnosis not present

## 2014-04-01 DIAGNOSIS — I509 Heart failure, unspecified: Secondary | ICD-10-CM | POA: Insufficient documentation

## 2014-04-01 DIAGNOSIS — I129 Hypertensive chronic kidney disease with stage 1 through stage 4 chronic kidney disease, or unspecified chronic kidney disease: Secondary | ICD-10-CM | POA: Diagnosis not present

## 2014-04-01 DIAGNOSIS — I10 Essential (primary) hypertension: Secondary | ICD-10-CM | POA: Diagnosis not present

## 2014-04-01 DIAGNOSIS — Z8639 Personal history of other endocrine, nutritional and metabolic disease: Secondary | ICD-10-CM | POA: Diagnosis not present

## 2014-04-01 DIAGNOSIS — R208 Other disturbances of skin sensation: Secondary | ICD-10-CM | POA: Diagnosis not present

## 2014-04-01 DIAGNOSIS — Z86010 Personal history of colonic polyps: Secondary | ICD-10-CM | POA: Insufficient documentation

## 2014-04-01 DIAGNOSIS — R069 Unspecified abnormalities of breathing: Secondary | ICD-10-CM | POA: Diagnosis not present

## 2014-04-01 DIAGNOSIS — Z87891 Personal history of nicotine dependence: Secondary | ICD-10-CM | POA: Insufficient documentation

## 2014-04-01 DIAGNOSIS — M129 Arthropathy, unspecified: Secondary | ICD-10-CM | POA: Diagnosis not present

## 2014-04-01 DIAGNOSIS — Z79899 Other long term (current) drug therapy: Secondary | ICD-10-CM | POA: Insufficient documentation

## 2014-04-01 DIAGNOSIS — R0602 Shortness of breath: Secondary | ICD-10-CM | POA: Diagnosis not present

## 2014-04-01 DIAGNOSIS — I6789 Other cerebrovascular disease: Secondary | ICD-10-CM | POA: Diagnosis not present

## 2014-04-01 LAB — CBC WITH DIFFERENTIAL/PLATELET
BASOS PCT: 0 % (ref 0–1)
Basophils Absolute: 0 10*3/uL (ref 0.0–0.1)
Eosinophils Absolute: 0 10*3/uL (ref 0.0–0.7)
Eosinophils Relative: 0 % (ref 0–5)
HEMATOCRIT: 31 % — AB (ref 36.0–46.0)
HEMOGLOBIN: 10.4 g/dL — AB (ref 12.0–15.0)
Lymphocytes Relative: 36 % (ref 12–46)
Lymphs Abs: 1.5 10*3/uL (ref 0.7–4.0)
MCH: 31.3 pg (ref 26.0–34.0)
MCHC: 33.5 g/dL (ref 30.0–36.0)
MCV: 93.4 fL (ref 78.0–100.0)
MONO ABS: 0.3 10*3/uL (ref 0.1–1.0)
MONOS PCT: 7 % (ref 3–12)
NEUTROS ABS: 2.3 10*3/uL (ref 1.7–7.7)
Neutrophils Relative %: 57 % (ref 43–77)
Platelets: 148 10*3/uL — ABNORMAL LOW (ref 150–400)
RBC: 3.32 MIL/uL — AB (ref 3.87–5.11)
RDW: 13.4 % (ref 11.5–15.5)
WBC: 4.1 10*3/uL (ref 4.0–10.5)

## 2014-04-01 LAB — I-STAT CHEM 8, ED
BUN: 28 mg/dL — ABNORMAL HIGH (ref 6–23)
CHLORIDE: 110 meq/L (ref 96–112)
Calcium, Ion: 1.18 mmol/L (ref 1.13–1.30)
Creatinine, Ser: 1 mg/dL (ref 0.50–1.10)
Glucose, Bld: 96 mg/dL (ref 70–99)
HEMATOCRIT: 32 % — AB (ref 36.0–46.0)
Hemoglobin: 10.9 g/dL — ABNORMAL LOW (ref 12.0–15.0)
POTASSIUM: 3.3 meq/L — AB (ref 3.7–5.3)
SODIUM: 144 meq/L (ref 137–147)
TCO2: 20 mmol/L (ref 0–100)

## 2014-04-01 LAB — I-STAT TROPONIN, ED: TROPONIN I, POC: 0.02 ng/mL (ref 0.00–0.08)

## 2014-04-01 LAB — PROTIME-INR
INR: 1.11 (ref 0.00–1.49)
Prothrombin Time: 14.3 seconds (ref 11.6–15.2)

## 2014-04-01 LAB — I-STAT CG4 LACTIC ACID, ED: Lactic Acid, Venous: 1.25 mmol/L (ref 0.5–2.2)

## 2014-04-01 LAB — TROPONIN I

## 2014-04-01 MED ORDER — NITROGLYCERIN 0.4 MG SL SUBL
0.4000 mg | SUBLINGUAL_TABLET | SUBLINGUAL | Status: DC | PRN
  Administered 2014-04-01: 0.4 mg via SUBLINGUAL
  Filled 2014-04-01: qty 1

## 2014-04-01 MED ORDER — IOHEXOL 350 MG/ML SOLN
100.0000 mL | Freq: Once | INTRAVENOUS | Status: AC | PRN
  Administered 2014-04-01: 100 mL via INTRAVENOUS

## 2014-04-01 MED ORDER — SODIUM CHLORIDE 0.9 % IV BOLUS (SEPSIS)
500.0000 mL | Freq: Once | INTRAVENOUS | Status: AC
  Administered 2014-04-01: 500 mL via INTRAVENOUS

## 2014-04-01 NOTE — ED Provider Notes (Signed)
CSN: [id number]     Arrival date & time 04/01/14  0123 History   First MD Initiated Contact with Patient 04/01/14 0148     Chief Complaint  Patient presents with  . Back Pain     (Consider location/radiation/quality/duration/timing/severity/associated sxs/prior Treatment) HPI Jane [name] is a 80 y.o. female with with a past medical history of hypertension hyperlipidemia and CHF coming in with shortness of breath and back pain. Patient states she was sitting on the toilet and had sudden onset of back pain in the mid scapular region radiating bilaterally in her back. She also shortness of breath. She denies any chest pain at any time. Patient does admit to numbness in her left hand but states that it has been there for some time and is not new. EMS gave her one nitroglycerin which resolved her back pain. She is currently stating she is asymptomatic. She's had no recent fevers infections or coughing. She denies any urinary complaints abdominal pain vomiting or diarrhea. Patient has no further complaints.  10 Systems reviewed and are negative for acute change except as noted in the HPI.     Past Medical History  Diagnosis Date  . Anemia   . Anxiety   . Arthritis   . GERD (gastroesophageal reflux disease) 2009    EGD with benign gastric polyp too  . Hyperlipidemia   . Hypertension   . Vitamin D deficiency   . Colon polyp     2009 colonoscopy, not retrieved for pathology  . Hiatal hernia   . Diverticulosis 2009  . Aortic insufficiency     mild  . Stroke 1975  . CKD (chronic kidney disease) stage 3, GFR 30-59 ml/min   . CHF (congestive heart failure)    Past Surgical History  Procedure Laterality Date  . Appendectomy    . Abdominal hysterectomy    . Artery biopsy Left 12/16/2012    Procedure: BIOPSY TEMPORAL ARTERY;  Surgeon: Mal Misty, MD;  Location: [facility] OR;  Service: Vascular;  Laterality: Left;   Family History  Problem Relation Age of Onset  . Stroke Mother   .  Hypertension Mother   . Stroke Father   . Hypertension Father   . Diabetes Sister    History  Substance Use Topics  . Smoking status: Former Smoker    Types: Cigarettes    Quit date: 07/01/1984  . Smokeless tobacco: Former Systems developer  . Alcohol Use: No   OB History   Grav Para Term Preterm Abortions TAB SAB Ect Mult Living                 Review of Systems    Allergies  Review of patient's allergies indicates no known allergies.  Home Medications   Prior to Admission medications   Medication Sig Start Date End Date Taking? Authorizing Provider  acetaminophen (TYLENOL) 325 MG tablet Take 2 tablets (650 mg total) by mouth every 6 (six) hours as needed for pain. 01/25/13   Artis Delay, MD  artificial tears (LACRILUBE) OINT ophthalmic ointment Apply 1 application to eye at bedtime and may repeat dose one time if needed. A thin layer of ointment should be applied to the exposed eyelid, taking care not to touch tip of applicator to eyelid; if complaints of ocular dryness, notify MD 12/17/12   Wilber Oliphant, MD  aspirin EC 81 MG tablet Take 81 mg by mouth daily.    Historical Provider, MD  diphenhydrAMINE (BENADRYL) 25 MG tablet Take 1 tablet (  25 mg total) by mouth at bedtime and may repeat dose one time if needed. 10/15/13   Lesly Dukes, MD  diphenhydrAMINE-zinc acetate (BENADRYL) cream Apply topically 3 (three) times daily as needed for itching. 10/15/13   Lesly Dukes, MD  ferrous sulfate 325 (65 FE) MG EC tablet Take 1 tablet (325 mg total) by mouth 2 (two) times daily. 08/09/13 08/09/14  Valaria Good, MD  furosemide (LASIX) 20 MG tablet Take 1 tablet (20 mg total) by mouth 2 (two) times daily. 01/20/14 01/20/15  Malena Catholic, MD  loratadine (CLARITIN) 10 MG tablet Take 1 tablet (10 mg total) by mouth daily. 10/15/13   Lesly Dukes, MD  losartan (COZAAR) 50 MG tablet take 1 tablet by mouth once daily 11/19/13   Valaria Good, MD  metoprolol (TOPROL-XL) 200 MG 24 hr tablet Take 0.5  tablets (100 mg total) by mouth daily. 01/20/14   Malena Catholic, MD  omeprazole (PRILOSEC) 20 MG capsule take 1 capsule by mouth once daily (MAY TAKE 2 TIMES A DAY IF HEARTBURN INCREASES) 02/07/14   Bartholomew Crews, MD  Potassium Chloride ER 20 MEQ TBCR Take 20 mEq by mouth daily. 06/03/13   Valaria Good, MD  senna-docusate (SENOKOT-S) 8.6-50 MG per tablet Take 1 tablet by mouth at bedtime. 09/11/13   Clinton Gallant, MD   BP 152/79  Pulse 95  Temp(Src) 97.9 F (36.6 C) (Oral)  Resp 22  SpO2 98% Physical Exam  Nursing note and vitals reviewed. Constitutional: She is oriented to person, place, and time. She appears well-developed and well-nourished. No distress.  HENT:  Head: Normocephalic and atraumatic.  Nose: Nose normal.  Mouth/Throat: Oropharynx is clear and moist. No oropharyngeal exudate.  Eyes: Conjunctivae and EOM are normal. Pupils are equal, round, and reactive to light. No scleral icterus.  Neck: Normal range of motion. Neck supple. No JVD present. No tracheal deviation present. No thyromegaly present.  Cardiovascular: Normal rate, regular rhythm and normal heart sounds.  Exam reveals no gallop and no friction rub.   No murmur heard. Pulmonary/Chest: Effort normal and breath sounds normal. No respiratory distress. She has no wheezes. She exhibits no tenderness.  Abdominal: Soft. Bowel sounds are normal. She exhibits no distension and no mass. There is no tenderness. There is no rebound and no guarding.  Musculoskeletal: Normal range of motion. She exhibits no edema and no tenderness.  Lymphadenopathy:    She has no cervical adenopathy.  Neurological: She is alert and oriented to person, place, and time. No cranial nerve deficit. She exhibits normal muscle tone.  Skin: Skin is warm and dry. No rash noted. She is not diaphoretic. No erythema. No pallor.    ED Course  Procedures (including critical care time) Labs Review Labs Reviewed  CBC WITH DIFFERENTIAL -  Abnormal; Notable for the following:    RBC 3.32 (*)    Hemoglobin 10.4 (*)    HCT 31.0 (*)    Platelets 148 (*)    All other components within normal limits  I-STAT CHEM 8, ED - Abnormal; Notable for the following:    Potassium 3.3 (*)    BUN 28 (*)    Hemoglobin 10.9 (*)    HCT 32.0 (*)    All other components within normal limits  URINE CULTURE  PROTIME-INR  URINALYSIS, ROUTINE W REFLEX MICROSCOPIC  TROPONIN I  I-STAT CG4 LACTIC ACID, ED  Randolm Idol, ED    Imaging Review Dg Chest 2 View  04/01/2014  CLINICAL DATA:  Upper back pain  EXAM: CHEST  2 VIEW  COMPARISON:  09/09/2013  FINDINGS: Lungs are clear.  No pleural effusion or pneumothorax.  Thank heart is top-normal in size.  Degenerative changes of the visualized thoracolumbar spine.  IMPRESSION: No evidence of acute cardiopulmonary disease.   Electronically Signed   By: Julian Hy M.D.   On: 04/01/2014 02:31   Ct Angio Chest Aorta W/cm &/or Wo/cm  04/01/2014   CLINICAL DATA:  Back pain, left hand numbness, hypotension.  EXAM: CT ANGIOGRAPHY CHEST WITH CONTRAST  TECHNIQUE: Multidetector CT imaging of the chest was performed using the standard protocol during bolus administration of intravenous contrast. Multiplanar CT image reconstructions and MIPs were obtained to evaluate the vascular anatomy.  CONTRAST:  159mL OMNIPAQUE IOHEXOL 350 MG/ML SOLN  COMPARISON:  12/27/2012  FINDINGS: Unenhanced images of the chest demonstrate calcification of the thoracic aorta and coronary arteries. No evidence of intramural hematoma.  Images obtained during the arterial phase contrast injection demonstrate normal caliber thoracic aorta. No evidence of aneurysm or dissection. Great vessel origins are patent. Visualized central pulmonary arteries are patent without evidence of significant pulmonary embolus.  Normal heart size. Esophagus is mostly decompressed. No significant lymphadenopathy in the chest. Evaluation of lungs is limited due to  respiratory motion artifact but no gross consolidation is identified. Fibrosis or linear atelectasis in the lung bases. No pneumothorax. No pleural effusions. Airways appear patent.  Included portions of the upper abdominal organs demonstrate no gross abnormality. Lower most images suggests the development of a abdominal aortic aneurysm incompletely included within the field of view. Degenerative changes in the thoracic spine. Anterior compression of an upper thoracic vertebral body, likely T4. This is unchanged since previous study.  Review of the MIP images confirms the above findings.  IMPRESSION: No evidence of aortic dissection. No focal acute abnormalities suggested.   Electronically Signed   By: Lucienne Capers M.D.   On: 04/01/2014 05:07     EKG Interpretation   Date/Time:  Friday April 01 2014 03:41:57 EDT Ventricular Rate:  79 PR Interval:  162 QRS Duration: 77 QT Interval:  373 QTC Calculation: 428 R Axis:   17 Text Interpretation:  Sinus rhythm Ventricular premature complex No  significant change since last tracing Confirmed by Glynn Octave  [id number]) on 04/01/2014 5:32:37 AM      MDM   Final diagnoses:  None    Patient presents to the emergency department out of concern for back pain shortness of breath. This pain was relieved with nitroglycerin. The patient is elderly and in female that she can present atypically for ACS. Will evaluate with 2 troponins in heart score. Also CT scan of the aorta for dissection was negative. Currently her heart score is 3, due to her age and comorbidities.  Repeat troponin is negative. Patient's heart score is 3 and she is low risk for ACS. She's currently not having any chest pain back pain or shortness of breath. Upon my reassessment the patient was found resting comfortably in bed. CT scan was negative for any dissection. Her vital signs remained stable and she is safe for discharge.  Everlene Balls, MD 04/01/14 [phone]

## 2014-04-01 NOTE — Discharge Instructions (Signed)
Back Pain, Adult Jane [name], you were seen today for back pain and shortness of breath.  Your CT scan was normal and your heart enzymes were negative on 2 lab draws.  Follow up with your regular doctor within 3 days for continued care.  If your symptoms return or worsen, come back to the ED for repeat evaluation.  Thank you. Back pain is very common. The pain often gets better over time. The cause of back pain is usually not dangerous. Most people can learn to manage their back pain on their own.  HOME CARE   Stay active. Start with short walks on flat ground if you can. Try to walk farther each day.  Do not sit, drive, or stand in one place for more than 30 minutes. Do not stay in bed.  Do not avoid exercise or work. Activity can help your back heal faster.  Be careful when you bend or lift an object. Bend at your knees, keep the object close to you, and do not twist.  Sleep on a firm mattress. Lie on your side, and bend your knees. If you lie on your back, put a pillow under your knees.  Only take medicines as told by your doctor.  Put ice on the injured area.  Put ice in a plastic bag.  Place a towel between your skin and the bag.  Leave the ice on for 15-20 minutes, 03-04 times a day for the first 2 to 3 days. After that, you can switch between ice and heat packs.  Ask your doctor about back exercises or massage.  Avoid feeling anxious or stressed. Find good ways to deal with stress, such as exercise. GET HELP RIGHT AWAY IF:   Your pain does not go away with rest or medicine.  Your pain does not go away in 1 week.  You have new problems.  You do not feel well.  The pain spreads into your legs.  You cannot control when you poop (bowel movement) or pee (urinate).  Your arms or legs feel weak or lose feeling (numbness).  You feel sick to your stomach (nauseous) or throw up (vomit).  You have belly (abdominal) pain.  You feel like you may pass out (faint). MAKE SURE  YOU:   Understand these instructions.  Will watch your condition.  Will get help right away if you are not doing well or get worse. Document Released: 12/04/2007 Document Revised: 09/09/2011 Document Reviewed: 10/19/2013 [facility] Patient Information 2015 [place], [place]. This information is not intended to replace advice given to you by your health care provider. Make sure you discuss any questions you have with your health care provider.

## 2014-04-01 NOTE — ED Notes (Signed)
Patient has been trying to call family to pick her up. Unable to reach anyone. Assessed pt ability to ambulate and she normally uses a walker and is unable to ambulate safely from a cab to her apartment on the 10th floor.

## 2014-04-01 NOTE — ED Notes (Signed)
Patient to pod c to await discharge

## 2014-04-01 NOTE — ED Notes (Signed)
Patient returned from CT scan, reconnected to monitors, and provided with phone to call family. Currently complaining of back pain again. MD [facility] informed.

## 2014-04-01 NOTE — ED Notes (Signed)
ptar called to transport

## 2014-04-01 NOTE — ED Notes (Addendum)
Pt reports her upper back pain and SOB has returned. New EKG performed,printed and Dr. Claudine Mouton aware. Pt in NAD.

## 2014-04-01 NOTE — ED Notes (Addendum)
PER EMS: pt from home, reports about 0050 this morning pt experienced aching upper back pain between shoulder blades and SOB and she took 324 asa. Pt radiated down left arm and numbness in fingers but reports the numbness has been ongoing for months. Initial BP 250/130 and EMS adm 1 nitro and BP 136/78 and back pain is gone. Dr. Claudine Mouton at bedside. A&Ox4. NSR per EMS. Denies CP.

## 2014-04-04 ENCOUNTER — Encounter: Payer: Self-pay | Admitting: Internal Medicine

## 2014-04-04 ENCOUNTER — Encounter: Payer: Medicare Other | Admitting: Internal Medicine

## 2014-04-10 ENCOUNTER — Other Ambulatory Visit: Payer: Self-pay

## 2014-04-10 ENCOUNTER — Emergency Department (HOSPITAL_COMMUNITY)
Admission: EM | Admit: 2014-04-10 | Discharge: 2014-04-10 | Disposition: A | Discharge status: Home / Self Care | Payer: Medicare Other | Attending: Emergency Medicine | Admitting: Emergency Medicine

## 2014-04-10 ENCOUNTER — Emergency Department (HOSPITAL_COMMUNITY): Payer: Medicare Other

## 2014-04-10 ENCOUNTER — Encounter (HOSPITAL_COMMUNITY): Payer: Self-pay | Admitting: Emergency Medicine

## 2014-04-10 DIAGNOSIS — R0600 Dyspnea, unspecified: Secondary | ICD-10-CM

## 2014-04-10 DIAGNOSIS — Z8673 Personal history of transient ischemic attack (TIA), and cerebral infarction without residual deficits: Secondary | ICD-10-CM | POA: Insufficient documentation

## 2014-04-10 DIAGNOSIS — M1990 Unspecified osteoarthritis, unspecified site: Secondary | ICD-10-CM | POA: Insufficient documentation

## 2014-04-10 DIAGNOSIS — I129 Hypertensive chronic kidney disease with stage 1 through stage 4 chronic kidney disease, or unspecified chronic kidney disease: Secondary | ICD-10-CM | POA: Diagnosis not present

## 2014-04-10 DIAGNOSIS — Z87891 Personal history of nicotine dependence: Secondary | ICD-10-CM | POA: Insufficient documentation

## 2014-04-10 DIAGNOSIS — D649 Anemia, unspecified: Secondary | ICD-10-CM | POA: Insufficient documentation

## 2014-04-10 DIAGNOSIS — I509 Heart failure, unspecified: Secondary | ICD-10-CM | POA: Insufficient documentation

## 2014-04-10 DIAGNOSIS — R079 Chest pain, unspecified: Secondary | ICD-10-CM | POA: Diagnosis not present

## 2014-04-10 DIAGNOSIS — Z8639 Personal history of other endocrine, nutritional and metabolic disease: Secondary | ICD-10-CM | POA: Diagnosis not present

## 2014-04-10 DIAGNOSIS — Z79899 Other long term (current) drug therapy: Secondary | ICD-10-CM | POA: Insufficient documentation

## 2014-04-10 DIAGNOSIS — N183 Chronic kidney disease, stage 3 (moderate): Secondary | ICD-10-CM | POA: Insufficient documentation

## 2014-04-10 DIAGNOSIS — Z86010 Personal history of colonic polyps: Secondary | ICD-10-CM | POA: Diagnosis not present

## 2014-04-10 DIAGNOSIS — I10 Essential (primary) hypertension: Secondary | ICD-10-CM | POA: Diagnosis not present

## 2014-04-10 DIAGNOSIS — K219 Gastro-esophageal reflux disease without esophagitis: Secondary | ICD-10-CM | POA: Diagnosis not present

## 2014-04-10 DIAGNOSIS — Z8659 Personal history of other mental and behavioral disorders: Secondary | ICD-10-CM | POA: Diagnosis not present

## 2014-04-10 DIAGNOSIS — R0602 Shortness of breath: Secondary | ICD-10-CM | POA: Diagnosis present

## 2014-04-10 DIAGNOSIS — Z7982 Long term (current) use of aspirin: Secondary | ICD-10-CM | POA: Insufficient documentation

## 2014-04-10 DIAGNOSIS — R0689 Other abnormalities of breathing: Secondary | ICD-10-CM | POA: Diagnosis not present

## 2014-04-10 LAB — COMPREHENSIVE METABOLIC PANEL
ALT: 7 U/L (ref 0–35)
ANION GAP: 12 (ref 5–15)
AST: 14 U/L (ref 0–37)
Albumin: 3.5 g/dL (ref 3.5–5.2)
Alkaline Phosphatase: 79 U/L (ref 39–117)
BUN: 24 mg/dL — ABNORMAL HIGH (ref 6–23)
CALCIUM: 8.9 mg/dL (ref 8.4–10.5)
CHLORIDE: 106 meq/L (ref 96–112)
CO2: 23 meq/L (ref 19–32)
CREATININE: 1.01 mg/dL (ref 0.50–1.10)
GFR calc Af Amer: 58 mL/min — ABNORMAL LOW (ref >90)
GFR, EST NON AFRICAN AMERICAN: 50 mL/min — AB (ref >90)
Glucose, Bld: 95 mg/dL (ref 70–99)
Potassium: 3.6 mEq/L — ABNORMAL LOW (ref 3.7–5.3)
Sodium: 141 mEq/L (ref 137–147)
Total Bilirubin: 0.4 mg/dL (ref 0.3–1.2)
Total Protein: 7.3 g/dL (ref 6.0–8.3)

## 2014-04-10 LAB — CBC WITH DIFFERENTIAL/PLATELET
Basophils Absolute: 0 10*3/uL (ref 0.0–0.1)
Basophils Relative: 0 % (ref 0–1)
EOS PCT: 0 % (ref 0–5)
Eosinophils Absolute: 0 10*3/uL (ref 0.0–0.7)
HCT: 32 % — ABNORMAL LOW (ref 36.0–46.0)
HEMOGLOBIN: 10.6 g/dL — AB (ref 12.0–15.0)
LYMPHS PCT: 34 % (ref 12–46)
Lymphs Abs: 1.1 10*3/uL (ref 0.7–4.0)
MCH: 31.1 pg (ref 26.0–34.0)
MCHC: 33.1 g/dL (ref 30.0–36.0)
MCV: 93.8 fL (ref 78.0–100.0)
MONO ABS: 0.2 10*3/uL (ref 0.1–1.0)
MONOS PCT: 7 % (ref 3–12)
NEUTROS ABS: 1.9 10*3/uL (ref 1.7–7.7)
Neutrophils Relative %: 59 % (ref 43–77)
Platelets: 143 10*3/uL — ABNORMAL LOW (ref 150–400)
RBC: 3.41 MIL/uL — ABNORMAL LOW (ref 3.87–5.11)
RDW: 13.3 % (ref 11.5–15.5)
WBC: 3.3 10*3/uL — AB (ref 4.0–10.5)

## 2014-04-10 LAB — TROPONIN I

## 2014-04-10 NOTE — ED Notes (Signed)
No c/o shortness of breath.

## 2014-04-10 NOTE — ED Notes (Signed)
Pt from home via [facility] with c/o shortness of breath starting around 8 am with burning and tightness in her upper back.  Pt reports this pain is her heart burn.  Pt did not take any of her medication this morning.  Pt in NAD, A&O.

## 2014-04-10 NOTE — ED Provider Notes (Signed)
CSN: [id number]     Arrival date & time 04/10/14  1030 History   First MD Initiated Contact with Patient 04/10/14 1036     Chief Complaint  Patient presents with  . Shortness of Breath  . Back Pain     HPI Patient reports that she had some shortness of breath this morning that since has resolved.  She initially had some burning pain in her right upper back.  This is resolved as well.  She has a history of congestive heart failure without a known diagnosis of coronary artery disease.  She also has a history of GERD.  Reports this feels similar to her prior GERD.  Asymptomatic at the time of my evaluation.  No anterior chest pain.  No recent cough or congestion.  No fevers or chills.  Denies abdominal pain.  Denies low back pain.  Past Medical History  Diagnosis Date  . Anemia   . Anxiety   . Arthritis   . GERD (gastroesophageal reflux disease) 2009    EGD with benign gastric polyp too  . Hyperlipidemia   . Hypertension   . Vitamin D deficiency   . Colon polyp     2009 colonoscopy, not retrieved for pathology  . Hiatal hernia   . Diverticulosis 2009  . Aortic insufficiency     mild  . Stroke 1975  . CKD (chronic kidney disease) stage 3, GFR 30-59 ml/min   . CHF (congestive heart failure)    Past Surgical History  Procedure Laterality Date  . Appendectomy    . Abdominal hysterectomy    . Artery biopsy Left 12/16/2012    Procedure: BIOPSY TEMPORAL ARTERY;  Surgeon: Mal Misty, MD;  Location: [facility] OR;  Service: Vascular;  Laterality: Left;   Family History  Problem Relation Age of Onset  . Stroke Mother   . Hypertension Mother   . Stroke Father   . Hypertension Father   . Diabetes Sister    History  Substance Use Topics  . Smoking status: Former Smoker    Types: Cigarettes    Quit date: 07/01/1984  . Smokeless tobacco: Former Systems developer  . Alcohol Use: No   OB History   Grav Para Term Preterm Abortions TAB SAB Ect Mult Living                 Review of Systems  All  other systems reviewed and are negative.     Allergies  Review of patient's allergies indicates no known allergies.  Home Medications   Prior to Admission medications   Medication Sig Start Date End Date Taking? Authorizing Provider  acetaminophen (TYLENOL) 325 MG tablet Take 650 mg by mouth every 6 (six) hours as needed for mild pain.   Yes Historical Provider, MD  artificial tears (LACRILUBE) OINT ophthalmic ointment Place 1 application into the left eye at bedtime as needed for dry eyes.   Yes Historical Provider, MD  aspirin EC 81 MG tablet Take 81 mg by mouth daily.   Yes Historical Provider, MD  diphenhydrAMINE (BENADRYL) 25 mg capsule Take 25 mg by mouth at bedtime as needed for sleep.   Yes Historical Provider, MD  ferrous sulfate 325 (65 FE) MG tablet Take 325 mg by mouth 2 (two) times daily with a meal.   Yes Historical Provider, MD  furosemide (LASIX) 20 MG tablet Take 20 mg by mouth 2 (two) times daily.   Yes Historical Provider, MD  loratadine (CLARITIN) 10 MG tablet Take 10 mg by  mouth daily.   Yes Historical Provider, MD  losartan (COZAAR) 50 MG tablet Take 50 mg by mouth daily.   Yes Historical Provider, MD  metoprolol succinate (TOPROL-XL) 100 MG 24 hr tablet Take 100 mg by mouth daily. Take with or immediately following a meal.   Yes Historical Provider, MD  omeprazole (PRILOSEC) 20 MG capsule Take 20 mg by mouth daily.   Yes Historical Provider, MD  potassium chloride SA (K-DUR,KLOR-CON) 20 MEQ tablet Take 20 mEq by mouth daily.   Yes Historical Provider, MD  sennosides-docusate sodium (SENOKOT-S) 8.6-50 MG tablet Take 1 tablet by mouth at bedtime.   Yes Historical Provider, MD   BP 160/65  Pulse 65  Temp(Src) 98.7 F (37.1 C) (Oral)  Resp 22  Ht 5\' 5"  (1.651 m)  Wt 170 lb (77.111 kg)  BMI 28.29 kg/m2  SpO2 100% Physical Exam  Nursing note and vitals reviewed. Constitutional: She is oriented to person, place, and time. She appears well-developed and  well-nourished. No distress.  HENT:  Head: Normocephalic and atraumatic.  Eyes: EOM are normal.  Neck: Normal range of motion.  Cardiovascular: Normal rate, regular rhythm and normal heart sounds.   Pulmonary/Chest: Effort normal and breath sounds normal.  Abdominal: Soft. She exhibits no distension. There is no tenderness.  Musculoskeletal: Normal range of motion.  Neurological: She is alert and oriented to person, place, and time.  Skin: Skin is warm and dry.  Psychiatric: She has a normal mood and affect. Judgment normal.    ED Course  Procedures (including critical care time) Labs Review Labs Reviewed  CBC WITH DIFFERENTIAL - Abnormal; Notable for the following:    WBC 3.3 (*)    RBC 3.41 (*)    Hemoglobin 10.6 (*)    HCT 32.0 (*)    Platelets 143 (*)    All other components within normal limits  COMPREHENSIVE METABOLIC PANEL - Abnormal; Notable for the following:    Potassium 3.6 (*)    BUN 24 (*)    GFR calc non Af Amer 50 (*)    GFR calc Af Amer 58 (*)    All other components within normal limits  TROPONIN I    Imaging Review Dg Chest 2 View  04/10/2014   CLINICAL DATA:  Difficulty breathing and pain; hypertension  EXAM: CHEST  2 VIEW  COMPARISON:  Chest radiograph April 01, 2014 and chest CT April 01, 2014  FINDINGS: There is underlying emphysematous change. There is mild bibasilar scarring. There is no edema or consolidation. Heart is upper normal in size with pulmonary vascularity within normal limits. There is atherosclerotic change in the aortic arch region. No apparent adenopathy. There is thoracic lordosis.  IMPRESSION: Underlying emphysematous change.  No edema or consolidation.   Electronically Signed   By: Lowella Grip M.D.   On: 04/10/2014 12:45  I personally reviewed the imaging tests through PACS system I reviewed available ER/hospitalization records through the EMR    EKG Interpretation   Date/Time:  Sunday April 10 2014 10:39:16  EDT Ventricular Rate:  70 PR Interval:  144 QRS Duration: 87 QT Interval:  415 QTC Calculation: 448 R Axis:   14 Text Interpretation:  Sinus rhythm Atrial premature complex Probable left  atrial enlargement Low voltage, precordial leads Abnormal R-wave  progression, early transition No significant change was found Confirmed by  Yameli Delamater  MD, Gaius Ishaq ([id number]) on 04/10/2014 12:53:09 PM      MDM   Final diagnoses:  None    Overall  well-appearing.  Labs, EKG, chest x-ray without significant abnormalities.  No wheezing in the lungs.  This may represent GERD.  My suspicion for acute coronary syndrome is low.  Outpatient followup with her primary care team.  After she call her PCP for followup in the next 2-3 days.  She understands return to the ER for new or worsening symptoms.  Asymptomatic at this time    Hoy Morn, MD 04/10/14 1419

## 2014-04-12 ENCOUNTER — Encounter (HOSPITAL_COMMUNITY): Payer: Self-pay | Admitting: Emergency Medicine

## 2014-04-12 ENCOUNTER — Emergency Department (HOSPITAL_COMMUNITY)
Admission: EM | Admit: 2014-04-12 | Discharge: 2014-04-12 | Disposition: A | Discharge status: Home / Self Care | Payer: Medicare Other | Attending: Emergency Medicine | Admitting: Emergency Medicine

## 2014-04-12 ENCOUNTER — Emergency Department (HOSPITAL_COMMUNITY): Payer: Medicare Other

## 2014-04-12 DIAGNOSIS — I509 Heart failure, unspecified: Secondary | ICD-10-CM | POA: Diagnosis not present

## 2014-04-12 DIAGNOSIS — Z8639 Personal history of other endocrine, nutritional and metabolic disease: Secondary | ICD-10-CM | POA: Diagnosis not present

## 2014-04-12 DIAGNOSIS — N183 Chronic kidney disease, stage 3 (moderate): Secondary | ICD-10-CM | POA: Insufficient documentation

## 2014-04-12 DIAGNOSIS — Z8673 Personal history of transient ischemic attack (TIA), and cerebral infarction without residual deficits: Secondary | ICD-10-CM | POA: Insufficient documentation

## 2014-04-12 DIAGNOSIS — K219 Gastro-esophageal reflux disease without esophagitis: Secondary | ICD-10-CM | POA: Diagnosis not present

## 2014-04-12 DIAGNOSIS — M546 Pain in thoracic spine: Secondary | ICD-10-CM

## 2014-04-12 DIAGNOSIS — R0602 Shortness of breath: Secondary | ICD-10-CM | POA: Diagnosis not present

## 2014-04-12 DIAGNOSIS — H578 Other specified disorders of eye and adnexa: Secondary | ICD-10-CM | POA: Diagnosis not present

## 2014-04-12 DIAGNOSIS — Z86010 Personal history of colonic polyps: Secondary | ICD-10-CM | POA: Diagnosis not present

## 2014-04-12 DIAGNOSIS — M1990 Unspecified osteoarthritis, unspecified site: Secondary | ICD-10-CM

## 2014-04-12 DIAGNOSIS — K210 Gastro-esophageal reflux disease with esophagitis: Secondary | ICD-10-CM | POA: Diagnosis not present

## 2014-04-12 DIAGNOSIS — Z79899 Other long term (current) drug therapy: Secondary | ICD-10-CM | POA: Diagnosis not present

## 2014-04-12 DIAGNOSIS — M549 Dorsalgia, unspecified: Secondary | ICD-10-CM | POA: Diagnosis not present

## 2014-04-12 DIAGNOSIS — I129 Hypertensive chronic kidney disease with stage 1 through stage 4 chronic kidney disease, or unspecified chronic kidney disease: Secondary | ICD-10-CM | POA: Insufficient documentation

## 2014-04-12 DIAGNOSIS — I10 Essential (primary) hypertension: Secondary | ICD-10-CM | POA: Diagnosis not present

## 2014-04-12 DIAGNOSIS — Z87891 Personal history of nicotine dependence: Secondary | ICD-10-CM | POA: Diagnosis not present

## 2014-04-12 DIAGNOSIS — Z7982 Long term (current) use of aspirin: Secondary | ICD-10-CM | POA: Insufficient documentation

## 2014-04-12 DIAGNOSIS — R069 Unspecified abnormalities of breathing: Secondary | ICD-10-CM | POA: Diagnosis not present

## 2014-04-12 DIAGNOSIS — Z8659 Personal history of other mental and behavioral disorders: Secondary | ICD-10-CM | POA: Insufficient documentation

## 2014-04-12 DIAGNOSIS — H5789 Other specified disorders of eye and adnexa: Secondary | ICD-10-CM

## 2014-04-12 DIAGNOSIS — R918 Other nonspecific abnormal finding of lung field: Secondary | ICD-10-CM | POA: Diagnosis not present

## 2014-04-12 DIAGNOSIS — R109 Unspecified abdominal pain: Secondary | ICD-10-CM | POA: Diagnosis not present

## 2014-04-12 DIAGNOSIS — D649 Anemia, unspecified: Secondary | ICD-10-CM | POA: Diagnosis not present

## 2014-04-12 LAB — I-STAT TROPONIN, ED: Troponin i, poc: 0.02 ng/mL (ref 0.00–0.08)

## 2014-04-12 LAB — COMPREHENSIVE METABOLIC PANEL
ALT: 8 U/L (ref 0–35)
AST: 18 U/L (ref 0–37)
Albumin: 3.7 g/dL (ref 3.5–5.2)
Alkaline Phosphatase: 86 U/L (ref 39–117)
Anion gap: 13 (ref 5–15)
BUN: 21 mg/dL (ref 6–23)
CO2: 22 mEq/L (ref 19–32)
Calcium: 9.3 mg/dL (ref 8.4–10.5)
Chloride: 105 mEq/L (ref 96–112)
Creatinine, Ser: 1.08 mg/dL (ref 0.50–1.10)
GFR calc Af Amer: 53 mL/min — ABNORMAL LOW (ref >90)
GFR calc non Af Amer: 46 mL/min — ABNORMAL LOW (ref >90)
Glucose, Bld: 90 mg/dL (ref 70–99)
Potassium: 3.7 mEq/L (ref 3.7–5.3)
Sodium: 140 mEq/L (ref 137–147)
Total Bilirubin: 0.4 mg/dL (ref 0.3–1.2)
Total Protein: 7.5 g/dL (ref 6.0–8.3)

## 2014-04-12 LAB — CBC
HCT: 33.3 % — ABNORMAL LOW (ref 36.0–46.0)
Hemoglobin: 11 g/dL — ABNORMAL LOW (ref 12.0–15.0)
MCH: 30.5 pg (ref 26.0–34.0)
MCHC: 33 g/dL (ref 30.0–36.0)
MCV: 92.2 fL (ref 78.0–100.0)
Platelets: 148 10*3/uL — ABNORMAL LOW (ref 150–400)
RBC: 3.61 MIL/uL — ABNORMAL LOW (ref 3.87–5.11)
RDW: 13.2 % (ref 11.5–15.5)
WBC: 4 10*3/uL (ref 4.0–10.5)

## 2014-04-12 LAB — PRO B NATRIURETIC PEPTIDE: Pro B Natriuretic peptide (BNP): 381.6 pg/mL (ref 0–450)

## 2014-04-12 LAB — PROTIME-INR
INR: 1.07 (ref 0.00–1.49)
Prothrombin Time: 13.9 seconds (ref 11.6–15.2)

## 2014-04-12 MED ORDER — HYPROMELLOSE (GONIOSCOPIC) 2.5 % OP SOLN: 1.0000 [drp] | Freq: Four times a day (QID) | OPHTHALMIC | Status: DC | PRN

## 2014-04-12 MED ORDER — OMEPRAZOLE 20 MG PO CPDR: 20.0000 mg | DELAYED_RELEASE_CAPSULE | Freq: Two times a day (BID) | ORAL | Status: DC

## 2014-04-12 NOTE — ED Notes (Signed)
Pt returned from xray-- states both hands are numb, but denies any pain.

## 2014-04-12 NOTE — ED Notes (Signed)
Pt becomes short of breath when walking from bathroom to room, states has to hold on to the wall when walking in apt., does use a walker when out-- does not have her walker at present

## 2014-04-12 NOTE — Discharge Instructions (Signed)

## 2014-04-12 NOTE — ED Provider Notes (Signed)
CSN: [id number]     Arrival date & time 04/12/14  7408 History   First MD Initiated Contact with Patient 04/12/14 [phone]     Chief Complaint  Patient presents with  . Chest Pain  . Back Pain     (Consider location/radiation/quality/duration/timing/severity/associated sxs/prior Treatment) HPI Pt is an 80yo female with hx of anemia, anxiety, arthritis, GERD, hyperlipidemia, HTN, mild aortic insufficiency, CKD stage 3, stroke and CHF w/o known CAD presenting to ED with c/o mid-upper back pain that was burning in nature, severe enough to cause associated SOB.  Pt states pain has since resolved. Initially pt advised triage pain lasted for about 34min but states she is unsure how long it actually lasted.  States she was lying on the couch watching the news when symptoms started. Nothing makes pain better or worse. Denies any chest, back, or abdominal pain at this time. Denies SOB. Was seen on 10/11 for same, advised to f/u with PCP. States she could not get a f/u until 10/15.  Denies fever, n/v/d.   Pt also c/o chronic conditions including left eye irritation due to an ectropion of left lower eyelid. States she is out of her OTC eye drops. Reports having Rx ointment but states she feels the drops work better. Pt also c/o bilateral hand pain and difficulty moving her thumbs. Does report hx of arthritis. Denies new injuries. Pt does take acetaminophen as needed for her arthritis pain.    Past Medical History  Diagnosis Date  . Anemia   . Anxiety   . Arthritis   . GERD (gastroesophageal reflux disease) 2009    EGD with benign gastric polyp too  . Hyperlipidemia   . Hypertension   . Vitamin D deficiency   . Colon polyp     2009 colonoscopy, not retrieved for pathology  . Hiatal hernia   . Diverticulosis 2009  . Aortic insufficiency     mild  . Stroke 1975  . CKD (chronic kidney disease) stage 3, GFR 30-59 ml/min   . CHF (congestive heart failure)    Past Surgical History  Procedure  Laterality Date  . Appendectomy    . Abdominal hysterectomy    . Artery biopsy Left 12/16/2012    Procedure: BIOPSY TEMPORAL ARTERY;  Surgeon: Mal Misty, MD;  Location: [facility] OR;  Service: Vascular;  Laterality: Left;   Family History  Problem Relation Age of Onset  . Stroke Mother   . Hypertension Mother   . Stroke Father   . Hypertension Father   . Diabetes Sister    History  Substance Use Topics  . Smoking status: Former Smoker    Types: Cigarettes    Quit date: 07/01/1984  . Smokeless tobacco: Former Systems developer  . Alcohol Use: No   OB History   Grav Para Term Preterm Abortions TAB SAB Ect Mult Living                 Review of Systems  Constitutional: Negative for fever and chills.  HENT: Negative for congestion and rhinorrhea.   Eyes: Positive for redness and itching. Negative for photophobia, pain and visual disturbance.  Respiratory: Positive for shortness of breath. Negative for cough, wheezing and stridor.   Cardiovascular: Negative for chest pain.  Gastrointestinal: Negative for nausea, vomiting, abdominal pain, diarrhea and constipation.  Musculoskeletal: Positive for back pain and myalgias. Negative for neck pain and neck stiffness.  All other systems reviewed and are negative.     Allergies  Review of patient's  allergies indicates no known allergies.  Home Medications   Prior to Admission medications   Medication Sig Start Date End Date Taking? Authorizing Provider  acetaminophen (TYLENOL) 325 MG tablet Take 650 mg by mouth every 6 (six) hours as needed for mild pain.   Yes Historical Provider, MD  aspirin EC 81 MG tablet Take 81 mg by mouth daily.   Yes Historical Provider, MD  diphenhydrAMINE (BENADRYL) 25 mg capsule Take 25 mg by mouth at bedtime as needed for sleep.   Yes Historical Provider, MD  ferrous sulfate 325 (65 FE) MG tablet Take 325 mg by mouth 2 (two) times daily with a meal.   Yes Historical Provider, MD  furosemide (LASIX) 20 MG tablet Take  20 mg by mouth 2 (two) times daily.   Yes Historical Provider, MD  loratadine (CLARITIN) 10 MG tablet Take 10 mg by mouth daily.   Yes Historical Provider, MD  losartan (COZAAR) 50 MG tablet Take 50 mg by mouth daily.   Yes Historical Provider, MD  metoprolol succinate (TOPROL-XL) 100 MG 24 hr tablet Take 100 mg by mouth daily. Take with or immediately following a meal.   Yes Historical Provider, MD  potassium chloride SA (K-DUR,KLOR-CON) 20 MEQ tablet Take 20 mEq by mouth daily.   Yes Historical Provider, MD  sennosides-docusate sodium (SENOKOT-S) 8.6-50 MG tablet Take 1 tablet by mouth at bedtime.   Yes Historical Provider, MD  hydroxypropyl methylcellulose / hypromellose (ISOPTO TEARS / GONIOVISC) 2.5 % ophthalmic solution Place 1 drop into the left eye 4 (four) times daily as needed for dry eyes. 04/12/14   Noland Fordyce, PA-C  omeprazole (PRILOSEC) 20 MG capsule Take 1 capsule (20 mg total) by mouth 2 (two) times daily before a meal. 04/12/14   Noland Fordyce, PA-C   BP 159/74  Pulse 77  Temp(Src) 98 F (36.7 C) (Oral)  Resp 22  SpO2 99% Physical Exam  Nursing note and vitals reviewed. Constitutional: She appears well-developed and well-nourished. No distress.  Elderly female lying in exam bed, NAD  HENT:  Head: Normocephalic and atraumatic.  Eyes: Conjunctivae are normal. No scleral icterus.  Left eye: ectropion of lower eye lid.  Neck: Normal range of motion.  Cardiovascular: Normal rate, regular rhythm and normal heart sounds.   Regular rate and rhythm  Pulmonary/Chest: Effort normal and breath sounds normal. No respiratory distress. She has no wheezes. She has no rales. She exhibits no tenderness.  No respiratory distress, able to speak in full sentences w/o difficulty. Lungs: CTAB  Abdominal: Soft. Bowel sounds are normal. She exhibits no distension and no mass. There is no tenderness. There is no rebound and no guarding.  Musculoskeletal: Normal range of motion.  bilateral  hands, decreased ROM due to contractures c/o arthritis.   Neurological: She is alert.  Skin: Skin is warm and dry. She is not diaphoretic.    ED Course  Procedures (including critical care time) Labs Review Labs Reviewed  CBC - Abnormal; Notable for the following:    RBC 3.61 (*)    Hemoglobin 11.0 (*)    HCT 33.3 (*)    Platelets 148 (*)    All other components within normal limits  COMPREHENSIVE METABOLIC PANEL - Abnormal; Notable for the following:    GFR calc non Af Amer 46 (*)    GFR calc Af Amer 53 (*)    All other components within normal limits  PRO B NATRIURETIC PEPTIDE  PROTIME-INR  I-STAT TROPOININ, ED    Imaging  Review Dg Chest 2 View  04/10/2014   CLINICAL DATA:  Difficulty breathing and pain; hypertension  EXAM: CHEST  2 VIEW  COMPARISON:  Chest radiograph April 01, 2014 and chest CT April 01, 2014  FINDINGS: There is underlying emphysematous change. There is mild bibasilar scarring. There is no edema or consolidation. Heart is upper normal in size with pulmonary vascularity within normal limits. There is atherosclerotic change in the aortic arch region. No apparent adenopathy. There is thoracic lordosis.  IMPRESSION: Underlying emphysematous change.  No edema or consolidation.   Electronically Signed   By: Lowella Grip M.D.   On: 04/10/2014 12:45   CLINICAL DATA: Shortness of breath, back pain  EXAM: CHEST 2 VIEW  COMPARISON: 03/31/2014  FINDINGS: There is mild bilateral interstitial thickening. There is no focal parenchymal opacity, pleural effusion, or pneumothorax. There is stable cardiomegaly.  The osseous structures are unremarkable.  IMPRESSION: No active cardiopulmonary disease.   Electronically Signed By: Kathreen Devoid On: 04/12/2014 09:11     EKG Interpretation   Date/Time:  Tuesday April 12 2014 08:27:24 EDT Ventricular Rate:  71 PR Interval:  149 QRS Duration: 82 QT Interval:  415 QTC Calculation: 451 R Axis:   13 Text  Interpretation:   left atrial enlargement Low voltage, precordial  leads No significant change was found Confirmed by Wilson Singer  MD, STEPHEN  ([id number]) on 04/12/2014 8:46:26 AM      MDM   Final diagnoses:  Bilateral thoracic back pain  Shortness of breath  Irritation of left eye  Gastroesophageal reflux disease, esophagitis presence not specified  Arthritis    pt is an 80yo female c/o mid-upper back pain with associated SOB that started earlier today while pt was watching television. Symptoms resolved on their own. No chest, back, or abdominal pain, or SOB in ED. Pt c/o left eye irritation due to chronic condition with her eyelid. States she is out of her OTC eye drops but would like a prescription eye drop.  Pt also c/o bilateral hand pain and stiffness c/o hx of arthritis. Advised pt to f/u with PCP as scheduled for Thursday, 10/15 for recheck of symptoms including chronic problems. Discussed pt with Dr. Wilson Singer who also examined pt.  Not concerned for emergent process taking place at this time including ACS, pneumonia, pneumothorax, aortic disection, or PE. Vitals: WNL.  No tachycardia or hypoxia.  Will increase dose of omeprazole to twice daily. Return precautions provided. Pt verbalized understanding and agreement with tx plan.     Noland Fordyce, PA-C 04/12/14 1029

## 2014-04-12 NOTE — ED Notes (Addendum)
Pt on phone with family trying to find a ride home. No family available-- pt unable to walk from taxi to door of apt building to elevator to apt on 10th floor. Will call [facility].

## 2014-04-12 NOTE — ED Notes (Addendum)
To ED via [facility] from [facility]. With c/o burning across midback-- was here for same on Saturday. Denies any chest pain or back pain at present. Alert/oriented x 4.

## 2014-04-14 ENCOUNTER — Ambulatory Visit: Payer: Medicare Other | Admitting: Internal Medicine

## 2014-04-17 NOTE — ED Provider Notes (Signed)
Medical screening examination/treatment/procedure(s) were conducted as a shared visit with non-physician practitioner(s) and myself.  I personally evaluated the patient during the encounter.   EKG Interpretation   Date/Time:  Tuesday April 12 2014 25:42:70 EDT Ventricular Rate:  71 PR Interval:  149 QRS Duration: 82 QT Interval:  415 QTC Calculation: 451 R Axis:   13 Text Interpretation:   left atrial enlargement Low voltage, precordial  leads No significant change was found Confirmed by Katelyne Galster  MD, Rani Idler  ([id number]) on 04/12/2014 8:46:26 AM     85yf with upper back pain. Doubt ACS, dissection, pneumonia or other emergent pathology. Actually seems most consistent with GERD. On PPI daily. Will increase to BID.   Virgel Manifold, MD 04/17/14 [phone]

## 2014-04-20 ENCOUNTER — Telehealth: Payer: Self-pay | Admitting: Licensed Clinical Social Worker

## 2014-04-20 ENCOUNTER — Emergency Department (HOSPITAL_COMMUNITY)
Admission: EM | Admit: 2014-04-20 | Discharge: 2014-04-20 | Disposition: A | Discharge status: Home / Self Care | Payer: Medicare Other | Attending: Emergency Medicine | Admitting: Emergency Medicine

## 2014-04-20 ENCOUNTER — Encounter: Payer: Self-pay | Admitting: Licensed Clinical Social Worker

## 2014-04-20 ENCOUNTER — Encounter (HOSPITAL_COMMUNITY): Payer: Self-pay | Admitting: Emergency Medicine

## 2014-04-20 ENCOUNTER — Ambulatory Visit: Payer: Medicare Other | Admitting: Internal Medicine

## 2014-04-20 ENCOUNTER — Encounter: Payer: Self-pay | Admitting: Internal Medicine

## 2014-04-20 ENCOUNTER — Emergency Department (HOSPITAL_COMMUNITY): Payer: Medicare Other

## 2014-04-20 DIAGNOSIS — R079 Chest pain, unspecified: Secondary | ICD-10-CM | POA: Diagnosis not present

## 2014-04-20 DIAGNOSIS — D649 Anemia, unspecified: Secondary | ICD-10-CM | POA: Insufficient documentation

## 2014-04-20 DIAGNOSIS — M1990 Unspecified osteoarthritis, unspecified site: Secondary | ICD-10-CM | POA: Insufficient documentation

## 2014-04-20 DIAGNOSIS — I129 Hypertensive chronic kidney disease with stage 1 through stage 4 chronic kidney disease, or unspecified chronic kidney disease: Secondary | ICD-10-CM | POA: Diagnosis not present

## 2014-04-20 DIAGNOSIS — M549 Dorsalgia, unspecified: Secondary | ICD-10-CM

## 2014-04-20 DIAGNOSIS — Z602 Problems related to living alone: Secondary | ICD-10-CM

## 2014-04-20 DIAGNOSIS — Z8673 Personal history of transient ischemic attack (TIA), and cerebral infarction without residual deficits: Secondary | ICD-10-CM | POA: Diagnosis not present

## 2014-04-20 DIAGNOSIS — Z87891 Personal history of nicotine dependence: Secondary | ICD-10-CM | POA: Diagnosis not present

## 2014-04-20 DIAGNOSIS — K219 Gastro-esophageal reflux disease without esophagitis: Secondary | ICD-10-CM | POA: Diagnosis not present

## 2014-04-20 DIAGNOSIS — I10 Essential (primary) hypertension: Secondary | ICD-10-CM | POA: Diagnosis not present

## 2014-04-20 DIAGNOSIS — M546 Pain in thoracic spine: Secondary | ICD-10-CM | POA: Insufficient documentation

## 2014-04-20 DIAGNOSIS — Z7982 Long term (current) use of aspirin: Secondary | ICD-10-CM | POA: Insufficient documentation

## 2014-04-20 DIAGNOSIS — I639 Cerebral infarction, unspecified: Secondary | ICD-10-CM

## 2014-04-20 DIAGNOSIS — M545 Low back pain: Secondary | ICD-10-CM | POA: Diagnosis present

## 2014-04-20 DIAGNOSIS — I509 Heart failure, unspecified: Secondary | ICD-10-CM | POA: Diagnosis not present

## 2014-04-20 DIAGNOSIS — N189 Chronic kidney disease, unspecified: Secondary | ICD-10-CM | POA: Insufficient documentation

## 2014-04-20 DIAGNOSIS — R0609 Other forms of dyspnea: Secondary | ICD-10-CM | POA: Insufficient documentation

## 2014-04-20 DIAGNOSIS — Z86010 Personal history of colonic polyps: Secondary | ICD-10-CM | POA: Diagnosis not present

## 2014-04-20 DIAGNOSIS — M79602 Pain in left arm: Secondary | ICD-10-CM | POA: Diagnosis not present

## 2014-04-20 DIAGNOSIS — R413 Other amnesia: Secondary | ICD-10-CM | POA: Insufficient documentation

## 2014-04-20 DIAGNOSIS — Z79899 Other long term (current) drug therapy: Secondary | ICD-10-CM | POA: Diagnosis not present

## 2014-04-20 DIAGNOSIS — R0600 Dyspnea, unspecified: Secondary | ICD-10-CM | POA: Diagnosis not present

## 2014-04-20 DIAGNOSIS — M79622 Pain in left upper arm: Secondary | ICD-10-CM | POA: Diagnosis not present

## 2014-04-20 DIAGNOSIS — Z8639 Personal history of other endocrine, nutritional and metabolic disease: Secondary | ICD-10-CM | POA: Insufficient documentation

## 2014-04-20 DIAGNOSIS — Z8659 Personal history of other mental and behavioral disorders: Secondary | ICD-10-CM | POA: Insufficient documentation

## 2014-04-20 DIAGNOSIS — J811 Chronic pulmonary edema: Secondary | ICD-10-CM | POA: Diagnosis not present

## 2014-04-20 DIAGNOSIS — R0602 Shortness of breath: Secondary | ICD-10-CM | POA: Diagnosis not present

## 2014-04-20 LAB — BASIC METABOLIC PANEL
Anion gap: 13 (ref 5–15)
BUN: 21 mg/dL (ref 6–23)
CHLORIDE: 108 meq/L (ref 96–112)
CO2: 23 meq/L (ref 19–32)
CREATININE: 0.91 mg/dL (ref 0.50–1.10)
Calcium: 9.4 mg/dL (ref 8.4–10.5)
GFR calc Af Amer: 66 mL/min — ABNORMAL LOW (ref >90)
GFR calc non Af Amer: 57 mL/min — ABNORMAL LOW (ref >90)
Glucose, Bld: 96 mg/dL (ref 70–99)
Potassium: 4.1 mEq/L (ref 3.7–5.3)
Sodium: 144 mEq/L (ref 137–147)

## 2014-04-20 LAB — CBC WITH DIFFERENTIAL/PLATELET
BASOS ABS: 0 10*3/uL (ref 0.0–0.1)
Basophils Relative: 0 % (ref 0–1)
Eosinophils Absolute: 0 10*3/uL (ref 0.0–0.7)
Eosinophils Relative: 0 % (ref 0–5)
HCT: 32.2 % — ABNORMAL LOW (ref 36.0–46.0)
Hemoglobin: 10.7 g/dL — ABNORMAL LOW (ref 12.0–15.0)
LYMPHS PCT: 38 % (ref 12–46)
Lymphs Abs: 1.6 10*3/uL (ref 0.7–4.0)
MCH: 30.7 pg (ref 26.0–34.0)
MCHC: 33.2 g/dL (ref 30.0–36.0)
MCV: 92.3 fL (ref 78.0–100.0)
MONO ABS: 0.4 10*3/uL (ref 0.1–1.0)
MONOS PCT: 8 % (ref 3–12)
Neutro Abs: 2.3 10*3/uL (ref 1.7–7.7)
Neutrophils Relative %: 54 % (ref 43–77)
Platelets: 158 10*3/uL (ref 150–400)
RBC: 3.49 MIL/uL — AB (ref 3.87–5.11)
RDW: 13.2 % (ref 11.5–15.5)
WBC: 4.3 10*3/uL (ref 4.0–10.5)

## 2014-04-20 LAB — PRO B NATRIURETIC PEPTIDE: Pro B Natriuretic peptide (BNP): 531.1 pg/mL — ABNORMAL HIGH (ref 0–450)

## 2014-04-20 LAB — TROPONIN I: Troponin I: <0.3 ng/mL (ref <0.30)

## 2014-04-20 MED ORDER — METOPROLOL SUCCINATE ER 100 MG PO TB24
200.0000 mg | ORAL_TABLET | Freq: Every day | ORAL | Status: DC
  Administered 2014-04-20: 200 mg via ORAL
  Filled 2014-04-20 (×2): qty 2

## 2014-04-20 MED ORDER — LOSARTAN POTASSIUM 50 MG PO TABS
50.0000 mg | ORAL_TABLET | Freq: Every day | ORAL | Status: DC
  Administered 2014-04-20: 50 mg via ORAL
  Filled 2014-04-20 (×2): qty 1

## 2014-04-20 MED ORDER — HYDROCODONE-ACETAMINOPHEN 5-325 MG PO TABS
1.0000 | ORAL_TABLET | Freq: Once | ORAL | Status: AC
  Administered 2014-04-20: 1 via ORAL
  Filled 2014-04-20: qty 1

## 2014-04-20 NOTE — Discharge Instructions (Signed)
You should be contacted today to help with getting to your appointment today.  Your clinic will talk with you about getting extra help in your home.     Back Pain, Adult Back pain is very common. The pain often gets better over time. The cause of back pain is usually not dangerous. Most people can learn to manage their back pain on their own.  HOME CARE   Stay active. Start with short walks on flat ground if you can. Try to walk farther each day.  Do not sit, drive, or stand in one place for more than 30 minutes. Do not stay in bed.  Do not avoid exercise or work. Activity can help your back heal faster.  Be careful when you bend or lift an object. Bend at your knees, keep the object close to you, and do not twist.  Sleep on a firm mattress. Lie on your side, and bend your knees. If you lie on your back, put a pillow under your knees.  Only take medicines as told by your doctor.  Put ice on the injured area.  Put ice in a plastic bag.  Place a towel between your skin and the bag.  Leave the ice on for 15-20 minutes, 03-04 times a day for the first 2 to 3 days. After that, you can switch between ice and heat packs.  Ask your doctor about back exercises or massage.  Avoid feeling anxious or stressed. Find good ways to deal with stress, such as exercise. GET HELP RIGHT AWAY IF:   Your pain does not go away with rest or medicine.  Your pain does not go away in 1 week.  You have new problems.  You do not feel well.  The pain spreads into your legs.  You cannot control when you poop (bowel movement) or pee (urinate).  Your arms or legs feel weak or lose feeling (numbness).  You feel sick to your stomach (nauseous) or throw up (vomit).  You have belly (abdominal) pain.  You feel like you may pass out (faint). MAKE SURE YOU:   Understand these instructions.  Will watch your condition.  Will get help right away if you are not doing well or get worse. Document  Released: 12/04/2007 Document Revised: 09/09/2011 Document Reviewed: 10/19/2013 [facility] Patient Information 2015 [place], [place]. This information is not intended to replace advice given to you by your health care provider. Make sure you discuss any questions you have with your health care provider.  Back Exercises Back exercises help treat and prevent back injuries. The goal is to increase your strength in your belly (abdominal) and back muscles. These exercises can also help with flexibility. Start these exercises when told by your doctor. HOME CARE Back exercises include: Pelvic Tilt.  Lie on your back with your knees bent. Tilt your pelvis until the lower part of your back is against the floor. Hold this position 5 to 10 sec. Repeat this exercise 5 to 10 times. Knee to Chest.  Pull 1 knee up against your chest and hold for 20 to 30 seconds. Repeat this with the other knee. This may be done with the other leg straight or bent, whichever feels better. Then, pull both knees up against your chest. Sit-Ups or Curl-Ups.  Bend your knees 90 degrees. Start with tilting your pelvis, and do a partial, slow sit-up. Only lift your upper half 30 to 45 degrees off the floor. Take at least 2 to 3 seonds for each  sit-up. Do not do sit-ups with your knees out straight. If partial sit-ups are difficult, simply do the above but with only tightening your belly (abdominal) muscles and holding it as told. Hip-Lift.  Lie on your back with your knees flexed 90 degrees. Push down with your feet and shoulders as you raise your hips 2 inches off the floor. Hold for 10 seconds, repeat 5 to 10 times. Back Arches.  Lie on your stomach. Prop yourself up on bent elbows. Slowly press on your hands, causing an arch in your low back. Repeat 3 to 5 times. Shoulder-Lifts.  Lie face down with arms beside your body. Keep hips and belly pressed to floor as you slowly lift your head and shoulders off the floor. Do not overdo  your exercises. Be careful in the beginning. Exercises may cause you some mild back discomfort. If the pain lasts for more than 15 minutes, stop the exercises until you see your doctor. Improvement with exercise for back problems is slow.  Document Released: 07/20/2010 Document Revised: 09/09/2011 Document Reviewed: 04/18/2011 [facility] Patient Information 2015 [place], [place]. This information is not intended to replace advice given to you by your health care provider. Make sure you discuss any questions you have with your health care provider.  Shortness of Breath Shortness of breath means you have trouble breathing. Shortness of breath needs medical care right away. HOME CARE   Do not smoke.  Avoid being around chemicals or things (paint fumes, dust) that may bother your breathing.  Rest as needed. Slowly begin your normal activities.  Only take medicines as told by your doctor.  Keep all doctor visits as told. GET HELP RIGHT AWAY IF:   Your shortness of breath gets worse.  You feel lightheaded, pass out (faint), or have a cough that is not helped by medicine.  You cough up blood.  You have pain with breathing.  You have pain in your chest, arms, shoulders, or belly (abdomen).  You have a fever.  You cannot walk up stairs or exercise the way you normally do.  You do not get better in the time expected.  You have a hard time doing normal activities even with rest.  You have problems with your medicines.  You have any new symptoms. MAKE SURE YOU:  Understand these instructions.  Will watch your condition.  Will get help right away if you are not doing well or get worse. Document Released: 12/04/2007 Document Revised: 06/22/2013 Document Reviewed: 09/02/2011 [facility] Patient Information 2015 [place], [place]. This information is not intended to replace advice given to you by your health care provider. Make sure you discuss any questions you have with your health care  provider.

## 2014-04-20 NOTE — Progress Notes (Signed)
Patient ID: Jane [name], female   DOB: 18-Apr-1929, 80 y.o.   MRN: [id number] Pt has yet to be discharged from [facility] ED, will continue to follow for ED d/c note.

## 2014-04-20 NOTE — ED Notes (Signed)
Pt complains of back pain and arm pain that started earlier this am, she states that her left arm is swollen and sore

## 2014-04-20 NOTE — Addendum Note (Signed)
Addended by: Willow Ora on: 04/20/2014 12:28 PM   Modules accepted: Orders

## 2014-04-20 NOTE — ED Notes (Addendum)
MD at bedside. EDP OTTER STATED WILL D/C PT HOME

## 2014-04-20 NOTE — Progress Notes (Signed)
Patient ID: Jane [name], female   DOB: 02-13-1929, 80 y.o.   MRN: [id number] [name] was referred to [place] from physician following ED visit earlier this morning.  Physician requesting CSW transportation assistance with pt's ED f/u appointment today.  [name] has both Medicare and Medicaid benefits.  CSW sent urgent request to [facility] to access her Medicaid Medical transportation for today.  Awaiting response, [facility] unable to provide for this afternoon.  Will inquire with [facility]. [name] was previously linked with [facility] same issues as of late (missed appointments due to pt's reports of lack of transportation); however pt was hesitant to work with [facility] to arrange transportation.  Options for [name] at this time, would suggest to exhaust community services: 1. Referral to [facility] utilizing Medicaid benefits: if pt is agreeable -  Pt has been deferred by [facility] based on pt's request 2.  Home Health services RN for chronic disease management: should today's visit warrant need/pt agreeable 3. Personal Care Services: Should today's visit warrant need/pt agreeable 4. Referral to [facility] - should pt be agreeable  Placement: Pt would be unable to access her SNF benefit through Medicare at this time.  However, paid placement/ALF is an option is pt is agreeable and in need.  This would not be an immediate placement option.  Information on information above will by provided to physician for [facility] appointment at 1315 on 04/20/14.

## 2014-04-20 NOTE — Telephone Encounter (Signed)
CSW placed call to Ms. Kosier to discuss options for transportation to today's appointment.  Pt was discharged from the ED earlier this morning.  CSW telephoned pt's, allowed to ring with no answer.  Will try later today.

## 2014-04-20 NOTE — ED Provider Notes (Signed)
CSN: [id number]     Arrival date & time 04/20/14  0115 History   First MD Initiated Contact with Patient 04/20/14 [phone]     Chief Complaint  Patient presents with  . Back Pain  . Arm Pain     (Consider location/radiation/quality/duration/timing/severity/associated sxs/prior Treatment) HPI 80 year old female presents to emergency department from home with complaint of shortness of breath and for back pain, left arm pain and swelling.  Symptoms have been ongoing for the last several weeks.  Patient has had several visits to the emergency department for same.  She is a poor historian, but it appears that most episodes occur with exertion.  She reports that when she gets up from her living room and goes to the bathroom, the kitchen her back to her bedroom she become short of breath, and soon after developed pain in between her shoulder blades.  Patient lives alone.  She reports she has a neighbor that checks on her.  She sees her family and frequently.  Patient has history of anemia, reflux, aortic insufficiency chronic kidney disease and CHF.  She has a history of extropion of her left lower lid.  Patient has her discharge paperwork from her most recent visit with the prescription still stapled to the top.  Patient missed her last primary care visit on Thursday, and reports she has rescheduled it but is unsure when she is supposed to followup.  Patient seems confused about medications that she is on and taking.  She has problems with short-term memory.  She denies any trauma to her left arm.  She reports that she has numbness and tingling in both hands, left greater than right. Past Medical History  Diagnosis Date  . Anemia   . Anxiety   . Arthritis   . GERD (gastroesophageal reflux disease) 2009    EGD with benign gastric polyp too  . Hyperlipidemia   . Hypertension   . Vitamin D deficiency   . Colon polyp     2009 colonoscopy, not retrieved for pathology  . Hiatal hernia   . Diverticulosis  2009  . Aortic insufficiency     mild  . Stroke 1975  . CKD (chronic kidney disease) stage 3, GFR 30-59 ml/min   . CHF (congestive heart failure)    Past Surgical History  Procedure Laterality Date  . Appendectomy    . Abdominal hysterectomy    . Artery biopsy Left 12/16/2012    Procedure: BIOPSY TEMPORAL ARTERY;  Surgeon: Mal Misty, MD;  Location: [facility] OR;  Service: Vascular;  Laterality: Left;   Family History  Problem Relation Age of Onset  . Stroke Mother   . Hypertension Mother   . Stroke Father   . Hypertension Father   . Diabetes Sister    History  Substance Use Topics  . Smoking status: Former Smoker    Types: Cigarettes    Quit date: 07/01/1984  . Smokeless tobacco: Former Systems developer  . Alcohol Use: No   OB History   Grav Para Term Preterm Abortions TAB SAB Ect Mult Living                 Review of Systems   See History of Present Illness; otherwise all other systems are reviewed and negative, albeit somewhat limited due to patient's memory issues  Allergies  Review of patient's allergies indicates no known allergies.  Home Medications   Prior to Admission medications   Medication Sig Start Date End Date Taking? Authorizing Provider  acetaminophen (  TYLENOL) 325 MG tablet Take 650 mg by mouth every 6 (six) hours as needed for mild pain.   Yes Historical Provider, MD  aspirin EC 81 MG tablet Take 81 mg by mouth daily.   Yes Historical Provider, MD  diphenhydrAMINE (BENADRYL) 25 mg capsule Take 25 mg by mouth at bedtime as needed for sleep.   Yes Historical Provider, MD  ferrous sulfate 325 (65 FE) MG tablet Take 325 mg by mouth 2 (two) times daily with a meal.   Yes Historical Provider, MD  furosemide (LASIX) 20 MG tablet Take 20 mg by mouth 2 (two) times daily.   Yes Historical Provider, MD  hydroxypropyl methylcellulose / hypromellose (ISOPTO TEARS / GONIOVISC) 2.5 % ophthalmic solution Place 1 drop into the left eye 4 (four) times daily as needed for dry eyes.  04/12/14  Yes Noland Fordyce, PA-C  losartan (COZAAR) 50 MG tablet Take 50 mg by mouth daily.   Yes Historical Provider, MD  metoprolol (TOPROL-XL) 200 MG 24 hr tablet Take 200 mg by mouth daily.   Yes Historical Provider, MD  omeprazole (PRILOSEC) 20 MG capsule Take 1 capsule (20 mg total) by mouth 2 (two) times daily before a meal. 04/12/14  Yes Noland Fordyce, PA-C  potassium chloride SA (K-DUR,KLOR-CON) 20 MEQ tablet Take 20 mEq by mouth daily.   Yes Historical Provider, MD  sennosides-docusate sodium (SENOKOT-S) 8.6-50 MG tablet Take 1 tablet by mouth at bedtime.   Yes Historical Provider, MD   BP 161/69  Pulse 62  Temp(Src) 97.9 F (36.6 C) (Oral)  Resp 13  SpO2 96% Physical Exam  Nursing note and vitals reviewed. Constitutional: She is oriented to person, place, and time. She appears well-developed and well-nourished.  HENT:  Head: Normocephalic and atraumatic.  Right Ear: External ear normal.  Left Ear: External ear normal.  Nose: Nose normal.  Mouth/Throat: Oropharynx is clear and moist.  Eyes: Conjunctivae and EOM are normal. Pupils are equal, round, and reactive to light.  Neck: Normal range of motion. Neck supple. No JVD present. No tracheal deviation present. No thyromegaly present.  Cardiovascular: Normal rate, regular rhythm, normal heart sounds and intact distal pulses.  Exam reveals no gallop and no friction rub.   No murmur heard. Pulmonary/Chest: Effort normal and breath sounds normal. No stridor. No respiratory distress. She has no wheezes. She has no rales. She exhibits no tenderness.  Abdominal: Soft. Bowel sounds are normal. She exhibits no distension and no mass. There is no tenderness. There is no rebound and no guarding.  Musculoskeletal: Normal range of motion. She exhibits tenderness (patient has tenderness to left upper arm.  Patient reports edema to this area, no pitting edema noted.  Left upper arm is only slightly larger than right). She exhibits no edema.   Lymphadenopathy:    She has no cervical adenopathy.  Neurological: She is alert and oriented to person, place, and time. She displays normal reflexes. She exhibits normal muscle tone. Coordination normal.  Patient with some memory issues, difficulty answering questions about short-term events  Skin: Skin is warm and dry. No rash noted. No erythema. No pallor.  Psychiatric: She has a normal mood and affect. Her behavior is normal. Judgment and thought content normal.    ED Course  Procedures (including critical care time) Labs Review Labs Reviewed  CBC WITH DIFFERENTIAL - Abnormal; Notable for the following:    RBC 3.49 (*)    Hemoglobin 10.7 (*)    HCT 32.2 (*)    All  other components within normal limits  BASIC METABOLIC PANEL  TROPONIN I  PRO B NATRIURETIC PEPTIDE    Imaging Review Dg Chest Port 1 View  04/20/2014   CLINICAL DATA:  Mid chest pain through to the back this morning. Dyspnea.  EXAM: PORTABLE CHEST - 1 VIEW  COMPARISON:  04/12/2014  FINDINGS: Cardiac enlargement with mild central pulmonary vascular congestion. No edema or consolidation. No blunting of costophrenic angles. No pneumothorax. Calcified and tortuous aorta. Degenerative changes in the shoulders.  IMPRESSION: Cardiac enlargement with mild central pulmonary vascular congestion.   Electronically Signed   By: Lucienne Capers M.D.   On: 04/20/2014 06:19     EKG Interpretation None      MDM   Final diagnoses:  Dyspnea  Dyspnea on exertion  Upper back pain    80 year old female with dyspnea and upper central back pain with exertion ongoing for the last month.  This is the fourth ED visit for similar symptoms.  I am concerned about possible anginal equivalent, but patient has had negative ekg and troponins during each visit.  Patient has had CT angio chest with no dissection noted at the beginning of the month.  Patient may have some issues with memory and dementia as she has had difficulties following up  with her primary care doctor in the last month.  She does not appear to have family that is checking on her frequently.  Will discuss the outpatient clinics for evaluation and further workup of her memory issues to see if she is safe for continued living by herself.  7:59 AM D/w [facility] resident who reports patient has appointment today at 1 pm.  Pt was not aware of this appointment.  [facility] will work on transportation of patient to her appointment, will get social work, home health involved.      Kalman Drape, MD 04/20/14 [phone]

## 2014-04-20 NOTE — Telephone Encounter (Signed)
[name] returned call to this worker.  Pt has called [facility] triage and rescheduled today's appointment for Friday 10/23 at 2:15pm as she has been in the ED all morning.  Lengthy conversation with [name] regarding community services available.  Pt reluctant to receive assistance and is trying to remain as independent as possible.  Pt states she is taking her medication as prescribed and as no issues regarding medications.  Pt reports she is independent with meal prep and med administration.  [name] has a shower chair/transfer bench but has not used it, as she states she is uncomfortable getting on it and needs assistance.  Pt has been taking sponge baths at her sink.  [name] has not been using laundry facility to lauder her clothes, has been handwashing items.  It was unclear if this was because of cost or difficulty getting to laundry facilities.  [name] states she is able to get her sister, who lives in apt complex, to wash her clothes if needed.   CSW discussed the option of re-consult [facility], pt remember prior care manager, Jane [name] and stated she would like her again if available.  At this time, pt is in agreement for [facility] services, [facility] care management.  Pt aware she is in need of additional assistance but reluctant to lose her independence.  CSW discussed the benefit of community services, PCS for example.  Pt agreeable to discuss with physician during Friday's appointment.  Transportation for pt's 10/23 appt has been arranged.  Pt notified of pick up time 12:55-1:35.  Pt states she may call CSW back for reminder.  CSW will continue to follow for pt to await transportation on Friday.  Pt may also benefit from PACE program.

## 2014-04-20 NOTE — Progress Notes (Signed)
Patient ID: Jane [name], female   DOB: Jun 08, 1929, 80 y.o.   MRN: [id number]  [name] was seen in the [facility] ED this morning. I received a page from the ED provider who stated that the patient was stable for discharge from the ED but she was concerned about the patient's memory and her living situation. It appears that the patient lives by herself, has transportation issues, and has missed several appointment with her PCP.   Sent a message to the CSW at the [facility] to assist pt with transportation for her appointment this afternoon and for options for placement v Home Health needs.  The patient has an appointment today, at 1:15PM at the [facility] but will need transportation for this appointment--the CSW at the [facility] has been notified of this need.    Blain Pais, MD PGY3, IMTS 04/20/14. 8:09 AM

## 2014-04-22 ENCOUNTER — Telehealth: Payer: Self-pay | Admitting: Licensed Clinical Social Worker

## 2014-04-22 ENCOUNTER — Ambulatory Visit: Payer: Medicare Other | Admitting: Internal Medicine

## 2014-04-22 ENCOUNTER — Encounter: Payer: Self-pay | Admitting: Internal Medicine

## 2014-04-22 NOTE — Telephone Encounter (Signed)
CSW placed call to Ms. Bey to remind patient of today's appointment and transportation pick up time.  Pt states she was unaware of pick up Lucianne Lei and arranged her own transportation for today's appointment.  CSW inquired if pt confirmed that her friend would be able to transport her today, pt states she called friend yesterday and he has his own car.  Ms. Gravelle states she can not rush and most likely would not make the Lucianne Lei pick up time which is in 2 hours.  CSW canceled Medicaid transportation and informed pt of the immediate need to have her in the office today to set up support services.  Pt states "I will try to get there".

## 2014-04-25 NOTE — Telephone Encounter (Signed)
Request sent to [facility] care manager/social worker to notify this worker when/if they are able to complete a home visit.

## 2014-04-25 NOTE — Telephone Encounter (Signed)
CSW placed call to Ms. Tritschler to inquire about missed appointment on 04/22/14.  Pt states she does not remember why she missed the appointment but states she will check friend regarding transportation and call [facility] to make an appointment. Pt states she does not want to use [place] transportation based on wait time for pick up.  CSW will sign off.

## 2014-04-28 ENCOUNTER — Other Ambulatory Visit: Payer: Self-pay | Admitting: Internal Medicine

## 2014-04-28 DIAGNOSIS — F0390 Unspecified dementia without behavioral disturbance: Secondary | ICD-10-CM

## 2014-04-29 ENCOUNTER — Telehealth: Payer: Self-pay | Admitting: Internal Medicine

## 2014-04-29 ENCOUNTER — Encounter: Payer: Self-pay | Admitting: Licensed Clinical Social Worker

## 2014-04-29 ENCOUNTER — Telehealth: Payer: Self-pay | Admitting: Licensed Clinical Social Worker

## 2014-04-29 NOTE — Progress Notes (Signed)
Patient ID: Jane [name], female   DOB: 23-Jul-1928, 80 y.o.   MRN: [id number] Email note from [facility] LCSW received on 04/26/14: LCSW received a return call from pt. today, after LCSW first attempted to contact pt. at home, without success.  Pt. stated, "I don't recognize this number, who is this, I just dialed *[phone] on my phone because I couldn't get to the phone in time when you called".  LCSW introduced self, explained role and types of services provided through [employer].  LCSW obtained verbal consent from pt. to converse with LCSW, as well as allow LCSW to make referrals for pt. to various community agencies and resources.  LCSW also obtained two HIPPA compliant identifiers from pt., including pt.'s name and date of birth.  LCSW explained the reason for the call, reporting that LCSW was calling to assist pt. with referrals to community agencies and resources that can assist pt. with transportation to and from her physician appointments. Pt. talked at length about her sister that lives down the hall from her that pt. rarely speaks to because her sister refuses to answer her phone.  Pt. also spoke about a niece that has mental problems.  Pt. reported, "I just called to wish her a happy birthday but she called me back and fussed me out, so I just hung up on her".  Pt. went on to say, "I really don't know what her problem is but I know she's got one".  Pt. talked about her daughter-in-law and the fact that she and her son never come around or offer to assist pt. in any way.  Pt. reported, "The only person that I can really count on is not even blood, he's just a friend of the family and he drives me to all my appointments if I remember to call him in time".  Pt. then talked at length about how she does not like to use public transportation through [employer]), [place] [employer] [employer] and/or [employer].  Pt.  indicated that these four types of transportation services "leave her stranded" or "have her wait for hours at a time for pick-up and drop-off". LCSW explained to pt. that pt. is also eligible to receive transportation assistance through [employer] with [employer] [place].  LCSW provided pt. with the contact information for [employer], agreeing to [employer] on behalf of pt. to get pt. enrolled in their data base.  LCSW then inquired as to whether or not pt. has any upcoming physician appointments already scheduled for which she will require transportation.  Pt. denied, reporting "No, but I need to reschedule my appointment with Dr. Raelene Bott.  LCSW is aware that Dr. Luan Moore is pt.'s Primary Care Physician with the [facility].  LCSW encouraged pt. to go ahead and schedule the appointment with Dr. Raelene Bott so that LCSW can ensure that pt. has transportation arranged for this appointment.  LCSW also encouraged pt. to try not to miss any of her physician appointments, to contact LCSW if her friends or [employer] is unable to transport her. LCSW further explained to pt. that pt. is able to receive transportation assistance through [employer], at the expense of [employer], if pt. is unable to arrange transportation through her friend or if [employer] is unable to transport her.  LCSW thoroughly explained the process for using [employer], encouraging pt. to contact [place]  Evelene Croon, Care Management Assistant with [place] [employer], to schedule the transport, as well as providing pt. with Mr. Evelene Croon direct contact information.  LCSW went through the process with pt. several times, ensuring that pt. had a clear understanding of how to utilize [employer] through [facility] CM.  Pt. is aware that she will need to provide Mr. Reeves with at least 24 hours notice if transportation services are needed.  In  addition, pt. is aware that she will no longer be eligible for transportation assistance through [employer], if she "no shows" or neglects to cancel transportation within the 24 hour time-frame. LCSW then had a lengthy conversation with pt. regarding all available community services. Pt. admits that she is reluctant to receive assistance, trying to remain as independent as possible. On several different occasions, throughout the lengthy conversation, pt. reported that she is independent with all activities of daily living, wanting to ensure that LCSW is aware that she is able to prepare her own meals, as well as take her medications as prescribed.  Pt. indicated that she is agreeable to receiving assistance in the home, but reluctant to lose her independence.  LCSW encouraged pt. to discuss a referral for PCS ([place]) with Dr. Raelene Bott during her next scheduled appointment.  Pt. is not agreeable to placement into an assisted living facility, rest home or family care home, at this time, refusing to even consider this as an option.  Pt. may also benefit from the following services for which she is able to utilize her Adult Medicaid coverage:   1. Referral to [facility] (Partnership for [facility]) 2. Referral to [facility] ([place]) through the [place] 3. Referral to [facility] ([place]) through [facility] 4. Referral to [facility] ([place]) through [place] 5. Referral to PACE (Program of [place] for the Elderly)    Pt. admits that she is agreeable to consider receiving these types of services, first wanting to follow-up with Thea Silversmith, RNCM with [facility], for which pt. has received services in the past for disease management and was very pleased.  LCSW will continue to follow along, assessing for social work  needs and services, as well as making referrals to various community agencies and resources, per pt.'s request.   [name], BSW, MSW, [place]  Licensed Clinical Social Worker  [facility] [address] [place], [place] Cell: [phone]  Fax: [email or web address]

## 2014-04-29 NOTE — Telephone Encounter (Addendum)
Upon further review, it may be best for patient to come to clinic to be evaluated as soon as possible and hopefully receive the home health services (face-to-face referral) that she needs at that point. An appointment has now been scheduled for 05/04/2014 at 10:15 with Dr. Aundra Dubin.

## 2014-04-29 NOTE — Telephone Encounter (Signed)
Ms. Tolson transferred to CSW for transportation assistance.  Pt states her appointment has been changed and she needs to arrange transportation.  Ms. Hults aware appointment time is 10:15am on 11/4 but states that is "awfully early, I usually schedule in the afternoon".  CSW offered to see if appointment can be changed to an afternoon time, pt agreeable.  Pt now scheduled at 2:15pm.  Ms. Adami provided CSW with a phone number and name for transportation.  This worker informed Ms. Dudgeon, unable to confirm that number the name Mr. Evelene Croon is correct for [facility] transportation.  Pt to call and schedule transportation for her [facility] appt on 11/4 at 2:15.  CSW notified [facility] of appointment time.  CSW will leave information for Ms. Hiltz on [facility] RN, PCS, and PACE program and services.

## 2014-04-29 NOTE — Telephone Encounter (Signed)
Appointment has been arranged with me on 05/19/2014. At that point, patient will then be eligible for referral for home health services. I cannot see put in the referrals for these currently because the patient has not been in clinic yet, and has only been evaluated at [facility] emergency department for her recurrent issues with dyspnea.

## 2014-04-29 NOTE — Telephone Encounter (Signed)
Patient has had multiple visits to the emergency department over the last month and has not been showing up to clinic appointments lately.

## 2014-05-04 ENCOUNTER — Ambulatory Visit: Payer: Medicare Other | Admitting: Internal Medicine

## 2014-05-07 ENCOUNTER — Other Ambulatory Visit: Payer: Self-pay | Admitting: Internal Medicine

## 2014-05-10 ENCOUNTER — Ambulatory Visit: Payer: Medicare Other | Admitting: Internal Medicine

## 2014-05-10 ENCOUNTER — Encounter: Payer: Self-pay | Admitting: Internal Medicine

## 2014-05-16 ENCOUNTER — Telehealth: Payer: Self-pay | Admitting: Licensed Clinical Social Worker

## 2014-05-16 NOTE — Telephone Encounter (Signed)
CSW spoke with APS supervisor, concern regarding pt's ability to take medications as ordered due to cognitive deficits, concern regarding pt's ability to obtain medications since transportation to appointment has been issue.

## 2014-05-16 NOTE — Telephone Encounter (Signed)
Ms. Sachse as no-showed several appointments with [facility].  Has not been able to follow through on referral for transportation assistance.  CSW placed call to Ms. Moline's son, Manuela Neptune.  Son notified pt has an appointment at [facility] on Thursday 05/19/14.  CSW inquired if son would be able to assist with pt coming to this appointment.  Son and Daughter-in-law state they have tried to get pt to go to doctor's appointment but "she won't move".  Daughter-in-law states "she won't accept any help", "we've tried and we don't know what else to do".  Son and dau-in-law notified of CSW concerns and plan to contact APS.  Dau-in-law indicates they did not know what to do.  Family provided with CSW contact information and hours. CSW placed call to APS, referral based on suspected self-neglect complete.

## 2014-05-19 ENCOUNTER — Ambulatory Visit: Payer: Medicare Other | Admitting: Internal Medicine

## 2014-05-25 ENCOUNTER — Ambulatory Visit: Payer: Medicare Other | Admitting: Internal Medicine

## 2014-05-31 ENCOUNTER — Encounter (HOSPITAL_COMMUNITY): Payer: Self-pay | Admitting: *Deleted

## 2014-05-31 ENCOUNTER — Observation Stay (HOSPITAL_COMMUNITY)
Admission: EM | Admit: 2014-05-31 | Discharge: 2014-06-02 | Disposition: A | Discharge status: Home / Self Care | Payer: Medicare Other | Attending: Internal Medicine | Admitting: Internal Medicine

## 2014-05-31 ENCOUNTER — Emergency Department (HOSPITAL_COMMUNITY): Payer: Medicare Other

## 2014-05-31 DIAGNOSIS — J439 Emphysema, unspecified: Secondary | ICD-10-CM | POA: Diagnosis not present

## 2014-05-31 DIAGNOSIS — K5900 Constipation, unspecified: Secondary | ICD-10-CM | POA: Insufficient documentation

## 2014-05-31 DIAGNOSIS — Z87891 Personal history of nicotine dependence: Secondary | ICD-10-CM | POA: Diagnosis not present

## 2014-05-31 DIAGNOSIS — K219 Gastro-esophageal reflux disease without esophagitis: Secondary | ICD-10-CM | POA: Insufficient documentation

## 2014-05-31 DIAGNOSIS — D649 Anemia, unspecified: Secondary | ICD-10-CM | POA: Diagnosis present

## 2014-05-31 DIAGNOSIS — Z8673 Personal history of transient ischemic attack (TIA), and cerebral infarction without residual deficits: Secondary | ICD-10-CM | POA: Diagnosis not present

## 2014-05-31 DIAGNOSIS — N183 Chronic kidney disease, stage 3 (moderate): Secondary | ICD-10-CM | POA: Insufficient documentation

## 2014-05-31 DIAGNOSIS — E559 Vitamin D deficiency, unspecified: Secondary | ICD-10-CM | POA: Insufficient documentation

## 2014-05-31 DIAGNOSIS — E785 Hyperlipidemia, unspecified: Secondary | ICD-10-CM | POA: Insufficient documentation

## 2014-05-31 DIAGNOSIS — F0390 Unspecified dementia without behavioral disturbance: Secondary | ICD-10-CM | POA: Insufficient documentation

## 2014-05-31 DIAGNOSIS — R0600 Dyspnea, unspecified: Principal | ICD-10-CM | POA: Insufficient documentation

## 2014-05-31 DIAGNOSIS — I509 Heart failure, unspecified: Secondary | ICD-10-CM | POA: Insufficient documentation

## 2014-05-31 DIAGNOSIS — R6889 Other general symptoms and signs: Secondary | ICD-10-CM | POA: Diagnosis not present

## 2014-05-31 DIAGNOSIS — F419 Anxiety disorder, unspecified: Secondary | ICD-10-CM | POA: Insufficient documentation

## 2014-05-31 DIAGNOSIS — R0602 Shortness of breath: Secondary | ICD-10-CM | POA: Diagnosis not present

## 2014-05-31 DIAGNOSIS — I351 Nonrheumatic aortic (valve) insufficiency: Secondary | ICD-10-CM | POA: Diagnosis not present

## 2014-05-31 DIAGNOSIS — I10 Essential (primary) hypertension: Secondary | ICD-10-CM | POA: Diagnosis present

## 2014-05-31 DIAGNOSIS — R000 Tachycardia, unspecified: Secondary | ICD-10-CM

## 2014-05-31 DIAGNOSIS — F329 Major depressive disorder, single episode, unspecified: Secondary | ICD-10-CM | POA: Diagnosis not present

## 2014-05-31 DIAGNOSIS — I129 Hypertensive chronic kidney disease with stage 1 through stage 4 chronic kidney disease, or unspecified chronic kidney disease: Secondary | ICD-10-CM | POA: Diagnosis not present

## 2014-05-31 DIAGNOSIS — I517 Cardiomegaly: Secondary | ICD-10-CM | POA: Insufficient documentation

## 2014-05-31 DIAGNOSIS — Z7982 Long term (current) use of aspirin: Secondary | ICD-10-CM | POA: Diagnosis not present

## 2014-05-31 DIAGNOSIS — E876 Hypokalemia: Secondary | ICD-10-CM | POA: Insufficient documentation

## 2014-05-31 DIAGNOSIS — R5381 Other malaise: Secondary | ICD-10-CM | POA: Diagnosis present

## 2014-05-31 DIAGNOSIS — R0609 Other forms of dyspnea: Secondary | ICD-10-CM

## 2014-05-31 DIAGNOSIS — D509 Iron deficiency anemia, unspecified: Secondary | ICD-10-CM | POA: Diagnosis not present

## 2014-05-31 LAB — CBC WITH DIFFERENTIAL/PLATELET
BASOS ABS: 0 10*3/uL (ref 0.0–0.1)
BASOS PCT: 0 % (ref 0–1)
EOS PCT: 0 % (ref 0–5)
Eosinophils Absolute: 0 10*3/uL (ref 0.0–0.7)
HEMATOCRIT: 31 % — AB (ref 36.0–46.0)
Hemoglobin: 10.3 g/dL — ABNORMAL LOW (ref 12.0–15.0)
Lymphocytes Relative: 29 % (ref 12–46)
Lymphs Abs: 1 10*3/uL (ref 0.7–4.0)
MCH: 30.5 pg (ref 26.0–34.0)
MCHC: 33.2 g/dL (ref 30.0–36.0)
MCV: 91.7 fL (ref 78.0–100.0)
Monocytes Absolute: 0.3 10*3/uL (ref 0.1–1.0)
Monocytes Relative: 8 % (ref 3–12)
NEUTROS ABS: 2.1 10*3/uL (ref 1.7–7.7)
Neutrophils Relative %: 63 % (ref 43–77)
Platelets: 150 10*3/uL (ref 150–400)
RBC: 3.38 MIL/uL — ABNORMAL LOW (ref 3.87–5.11)
RDW: 13.3 % (ref 11.5–15.5)
WBC: 3.3 10*3/uL — ABNORMAL LOW (ref 4.0–10.5)

## 2014-05-31 LAB — TROPONIN I
Troponin I: <0.3 ng/mL (ref <0.30)
Troponin I: <0.3 ng/mL (ref <0.30)

## 2014-05-31 LAB — BASIC METABOLIC PANEL
ANION GAP: 15 (ref 5–15)
BUN: 12 mg/dL (ref 6–23)
CO2: 22 meq/L (ref 19–32)
Calcium: 9.3 mg/dL (ref 8.4–10.5)
Chloride: 105 mEq/L (ref 96–112)
Creatinine, Ser: 0.86 mg/dL (ref 0.50–1.10)
GFR calc Af Amer: 70 mL/min — ABNORMAL LOW (ref >90)
GFR, EST NON AFRICAN AMERICAN: 61 mL/min — AB (ref >90)
Glucose, Bld: 86 mg/dL (ref 70–99)
Potassium: 3.6 mEq/L — ABNORMAL LOW (ref 3.7–5.3)
Sodium: 142 mEq/L (ref 137–147)

## 2014-05-31 LAB — PRO B NATRIURETIC PEPTIDE: Pro B Natriuretic peptide (BNP): 367.4 pg/mL (ref 0–450)

## 2014-05-31 LAB — I-STAT TROPONIN, ED: Troponin i, poc: 0.01 ng/mL (ref 0.00–0.08)

## 2014-05-31 LAB — TSH: TSH: 0.923 u[IU]/mL (ref 0.350–4.500)

## 2014-05-31 MED ORDER — POLYVINYL ALCOHOL 1.4 % OP SOLN
1.0000 [drp] | Freq: Four times a day (QID) | OPHTHALMIC | Status: DC | PRN
  Administered 2014-06-01: 1 [drp] via OPHTHALMIC
  Filled 2014-05-31: qty 15

## 2014-05-31 MED ORDER — ASPIRIN EC 81 MG PO TBEC
81.0000 mg | DELAYED_RELEASE_TABLET | Freq: Every day | ORAL | Status: DC
  Administered 2014-05-31 – 2014-06-02 (×3): 81 mg via ORAL
  Filled 2014-05-31 (×3): qty 1

## 2014-05-31 MED ORDER — METOPROLOL SUCCINATE ER 100 MG PO TB24
100.0000 mg | ORAL_TABLET | Freq: Every day | ORAL | Status: DC
  Administered 2014-06-01 – 2014-06-02 (×2): 100 mg via ORAL
  Filled 2014-05-31 (×2): qty 1

## 2014-05-31 MED ORDER — ACETAMINOPHEN 325 MG PO TABS: 650.0000 mg | ORAL_TABLET | Freq: Four times a day (QID) | ORAL | Status: DC | PRN

## 2014-05-31 MED ORDER — FERROUS SULFATE 325 (65 FE) MG PO TABS
325.0000 mg | ORAL_TABLET | Freq: Two times a day (BID) | ORAL | Status: DC
  Administered 2014-05-31 – 2014-06-02 (×4): 325 mg via ORAL
  Filled 2014-05-31 (×5): qty 1

## 2014-05-31 MED ORDER — SODIUM CHLORIDE 0.9 % IJ SOLN
3.0000 mL | Freq: Two times a day (BID) | INTRAMUSCULAR | Status: DC
Start: 2014-05-31 — End: 2014-06-02
  Administered 2014-05-31 – 2014-06-02 (×5): 3 mL via INTRAVENOUS

## 2014-05-31 MED ORDER — LOSARTAN POTASSIUM 50 MG PO TABS
50.0000 mg | ORAL_TABLET | Freq: Every day | ORAL | Status: DC
  Administered 2014-05-31 – 2014-06-02 (×3): 50 mg via ORAL
  Filled 2014-05-31 (×3): qty 1

## 2014-05-31 MED ORDER — METOPROLOL SUCCINATE ER 100 MG PO TB24
200.0000 mg | ORAL_TABLET | Freq: Every day | ORAL | Status: DC
  Filled 2014-05-31: qty 2

## 2014-05-31 MED ORDER — HEPARIN SODIUM (PORCINE) 5000 UNIT/ML IJ SOLN
5000.0000 [IU] | Freq: Three times a day (TID) | INTRAMUSCULAR | Status: DC
  Administered 2014-05-31 – 2014-06-02 (×5): 5000 [IU] via SUBCUTANEOUS
  Filled 2014-05-31 (×6): qty 1

## 2014-05-31 MED ORDER — HYPROMELLOSE (GONIOSCOPIC) 2.5 % OP SOLN: 1.0000 [drp] | Freq: Four times a day (QID) | OPHTHALMIC | Status: DC | PRN

## 2014-05-31 MED ORDER — PANTOPRAZOLE SODIUM 40 MG PO TBEC
40.0000 mg | DELAYED_RELEASE_TABLET | Freq: Every day | ORAL | Status: DC
  Administered 2014-05-31 – 2014-06-02 (×3): 40 mg via ORAL
  Filled 2014-05-31 (×3): qty 1

## 2014-05-31 MED ORDER — FUROSEMIDE 20 MG PO TABS
20.0000 mg | ORAL_TABLET | Freq: Two times a day (BID) | ORAL | Status: DC
  Administered 2014-05-31 – 2014-06-02 (×5): 20 mg via ORAL
  Filled 2014-05-31 (×6): qty 1

## 2014-05-31 NOTE — ED Provider Notes (Signed)
CSN: [id number]     Arrival date & time 05/31/14  9622 History   First MD Initiated Contact with Patient 05/31/14 [phone]     Chief Complaint  Patient presents with  . Shortness of Breath  . Back Pain     (Consider location/radiation/quality/duration/timing/severity/associated sxs/prior Treatment) Patient is a 80 y.o. female presenting with shortness of breath.  Shortness of Breath Severity:  Moderate Duration:  1 day Timing:  Constant Chronicity:  New Context: not activity   Relieved by:  None tried Worsened by:  Nothing tried Ineffective treatments:  None tried Associated symptoms: no abdominal pain, no fever, no vomiting and no wheezing     Past Medical History  Diagnosis Date  . Anemia   . Anxiety   . Arthritis   . GERD (gastroesophageal reflux disease) 2009    EGD with benign gastric polyp too  . Hyperlipidemia   . Hypertension   . Vitamin D deficiency   . Colon polyp     2009 colonoscopy, not retrieved for pathology  . Hiatal hernia   . Diverticulosis 2009  . Aortic insufficiency     mild  . Stroke 1975  . CKD (chronic kidney disease) stage 3, GFR 30-59 ml/min   . CHF (congestive heart failure)    Past Surgical History  Procedure Laterality Date  . Appendectomy    . Abdominal hysterectomy    . Artery biopsy Left 12/16/2012    Procedure: BIOPSY TEMPORAL ARTERY;  Surgeon: Mal Misty, MD;  Location: [facility] OR;  Service: Vascular;  Laterality: Left;   Family History  Problem Relation Age of Onset  . Stroke Mother   . Hypertension Mother   . Stroke Father   . Hypertension Father   . Diabetes Sister    History  Substance Use Topics  . Smoking status: Former Smoker    Types: Cigarettes    Quit date: 07/01/1984  . Smokeless tobacco: Former Systems developer  . Alcohol Use: No   OB History    No data available     Review of Systems  Constitutional: Negative for fever.  Respiratory: Positive for shortness of breath. Negative for wheezing.   Gastrointestinal:  Negative for vomiting and abdominal pain.  Musculoskeletal: Negative for back pain.  Skin: Negative for pallor and wound.  All other systems reviewed and are negative.     Allergies  Review of patient's allergies indicates no known allergies.  Home Medications   Prior to Admission medications   Medication Sig Start Date End Date Taking? Authorizing Provider  acetaminophen (TYLENOL) 325 MG tablet Take 650 mg by mouth every 6 (six) hours as needed for mild pain.    Historical Provider, MD  aspirin EC 81 MG tablet Take 81 mg by mouth daily.    Historical Provider, MD  diphenhydrAMINE (BENADRYL) 25 mg capsule Take 25 mg by mouth at bedtime as needed for sleep.    Historical Provider, MD  ferrous sulfate 325 (65 FE) MG tablet take 1 tablet by mouth twice a day 05/10/14   Luan Moore, MD  furosemide (LASIX) 20 MG tablet Take 20 mg by mouth 2 (two) times daily.    Historical Provider, MD  hydroxypropyl methylcellulose / hypromellose (ISOPTO TEARS / GONIOVISC) 2.5 % ophthalmic solution Place 1 drop into the left eye 4 (four) times daily as needed for dry eyes. 04/12/14   Noland Fordyce, PA-C  losartan (COZAAR) 50 MG tablet Take 50 mg by mouth daily.    Historical Provider, MD  metoprolol (TOPROL-XL)  200 MG 24 hr tablet Take 200 mg by mouth daily.    Historical Provider, MD  omeprazole (PRILOSEC) 20 MG capsule Take 1 capsule (20 mg total) by mouth 2 (two) times daily before a meal. 04/12/14   Noland Fordyce, PA-C  potassium chloride SA (K-DUR,KLOR-CON) 20 MEQ tablet Take 20 mEq by mouth daily.    Historical Provider, MD  sennosides-docusate sodium (SENOKOT-S) 8.6-50 MG tablet Take 1 tablet by mouth at bedtime.    Historical Provider, MD   There were no vitals taken for this visit. Physical Exam  Constitutional: She is oriented to person, place, and time. She appears well-developed and well-nourished.  Eyes: Pupils are equal, round, and reactive to light.  Neck: Normal range of motion.   Cardiovascular: Normal rate and regular rhythm.   Pulmonary/Chest: Effort normal and breath sounds normal. No respiratory distress. She has no wheezes. She has no rales.  Abdominal: Soft. She exhibits no distension. There is no tenderness.  Musculoskeletal: Normal range of motion. She exhibits no edema or tenderness.  Neurological: She is alert and oriented to person, place, and time.  Skin: Skin is warm and dry.  Nursing note and vitals reviewed.   ED Course  Procedures (including critical care time) Labs Review Labs Reviewed - No data to display  Imaging Review No results found.   EKG Interpretation None      MDM   Final diagnoses:  None   79 year old female with a history of CHF on Lasix chronically disease hypertension as as MRSA from today for source of breath or one-day intermittent nature person previously. No lower extremity swelling cough fevers or other symptoms. Exam relatively benign as above. EKG without any obvious ischemia. Concern initially is for CHF exacerbation versus ACS unlikely to be PE or pneumonia. We will get a chest x-ray and labs to evaluate. Currently satting well will not need oxygen at this time.  Labs and imaging okay. Patient walked again continued to be dyspneic, worse with exertion and also became tachycardic and recurrence of her back pain. Concern for possible anginal equivalent with her dyspnea so we will admit her for ACS rule out further workup.     Merrily Pew, MD 05/31/14 [place], MD 06/01/14 [phone]

## 2014-05-31 NOTE — H&P (Signed)
Date: 05/31/2014               Patient Name:  Jane [name] MRN: [id number]  DOB: 04-Aug-1928 Age / Sex: 80 y.o., female   PCP: Luan Moore, MD         Medical Service: Internal Medicine Teaching Service         Attending Physician: Dr. Madilyn Fireman, MD    First Contact: Dr. Raelene Bott Pager: [phone]  Second Contact: Dr. Redmond Pulling Pager: [phone]       After Hours (After 5p/  First Contact Pager: [phone]  weekends / holidays): Second Contact Pager: [phone]   Chief Complaint: Dyspnea  History of Present Illness:   Patient is an 80 year old female with a history of heart failure with preserved ejection fraction, anxiety, dementia, hypertension, GERD who presents to the hospital for dyspnea. Patient reports that the dyspnea started around yesterday and is associated with some exertion in getting to the bathroom. Patient denies any associated chest pain, hemoptysis, orthopnea, productive cough, or recent immobilization. Patient is also reporting some diffuse upper and lower back pain that she says has been going on for several months. Patient states that the pain is moderate in severity and is a burning sensation that is alleviated with omeprazole. Patient denies any association between the dyspnea and the back pain. Patient denies any history of malignancy or calf pain. Currently upon interview on the floor, patient reports that her dyspnea has mostly resolved. Patient otherwise denies any fevers, chills, nausea, vomiting, abdominal pain, constipation, dysuria, or hematuria.  Upon review of the chart, patient has been repeatedly evaluated emergency department for similar symptoms. She has consistently been ruled out for cardiac etiologies and for pulmonary embolism. She lives at home alone and does not have family that checks in on her regularly. Patient has also missed repeated appointments in outpatient clinic with her PCP. When question as to the reason that she misses these appointments,  patient states that she simply did not feel like getting dressed and leaving for these appointments. Patient states that she does have transportation set up for her to get to these appointments.  Meds: No current facility-administered medications for this encounter.   Current Outpatient Prescriptions  Medication Sig Dispense Refill  . acetaminophen (TYLENOL) 325 MG tablet Take 650 mg by mouth every 6 (six) hours as needed for mild pain.    Marland Kitchen aspirin EC 81 MG tablet Take 81 mg by mouth daily.    . ferrous sulfate 325 (65 FE) MG tablet take 1 tablet by mouth twice a day 60 tablet 3  . furosemide (LASIX) 20 MG tablet Take 20 mg by mouth 2 (two) times daily.    . hydroxypropyl methylcellulose / hypromellose (ISOPTO TEARS / GONIOVISC) 2.5 % ophthalmic solution Place 1 drop into the left eye 4 (four) times daily as needed for dry eyes. 15 mL 2  . losartan (COZAAR) 50 MG tablet Take 50 mg by mouth daily.    . metoprolol (TOPROL-XL) 200 MG 24 hr tablet Take 200 mg by mouth daily.    Marland Kitchen omeprazole (PRILOSEC) 20 MG capsule Take 1 capsule (20 mg total) by mouth 2 (two) times daily before a meal. 30 capsule 0  . potassium chloride SA (K-DUR,KLOR-CON) 20 MEQ tablet Take 20 mEq by mouth daily.    . sennosides-docusate sodium (SENOKOT-S) 8.6-50 MG tablet Take 1 tablet by mouth at bedtime.    . diphenhydrAMINE (BENADRYL) 25 mg capsule Take 25 mg by mouth at bedtime as  needed for sleep.      Past Medical History  Diagnosis Date  . Anemia   . Anxiety   . Arthritis   . GERD (gastroesophageal reflux disease) 2009    EGD with benign gastric polyp too  . Hyperlipidemia   . Hypertension   . Vitamin D deficiency   . Colon polyp     2009 colonoscopy, not retrieved for pathology  . Hiatal hernia   . Diverticulosis 2009  . Aortic insufficiency     mild  . Stroke 1975  . CKD (chronic kidney disease) stage 3, GFR 30-59 ml/min   . CHF (congestive heart failure)    Past Surgical History  Procedure  Laterality Date  . Appendectomy    . Abdominal hysterectomy    . Artery biopsy Left 12/16/2012    Procedure: BIOPSY TEMPORAL ARTERY;  Surgeon: Mal Misty, MD;  Location: [facility];  Service: Vascular;  Laterality: Left;    Allergies: Allergies as of 05/31/2014  . (No Known Allergies)   Family History  Problem Relation Age of Onset  . Stroke Mother   . Hypertension Mother   . Stroke Father   . Hypertension Father   . Diabetes Sister    History   Social History  . Marital Status: Widowed    Spouse Name: N/A    Number of Children: N/A  . Years of Education: N/A   Occupational History  . Not on file.   Social History Main Topics  . Smoking status: Former Smoker    Types: Cigarettes    Quit date: 07/02/1979  . Smokeless tobacco: Former Systems developer  . Alcohol Use: No  . Drug Use: No  . Sexual Activity: No   Other Topics Concern  . Not on file   Social History Narrative    Review of Systems: All pertinent ROS has stated in HPI.   Physical Exam: Blood pressure 154/62, pulse 78, temperature 98.1 F (36.7 C), temperature source Oral, resp. rate 17, height 5\' 3"  (1.6 m), weight 172 lb (78.019 kg), SpO2 98 %. General: resting in bed, in no acute distress HEENT: PERRL, EOMI, no scleral icterus, ectropion Cardiac: Tachycardic, regular rhythm, no rubs, murmurs or gallops Pulm: clear to auscultation bilaterally, moving normal volumes of air Abd: soft, nontender, nondistended, BS present Ext: warm and well perfused, no pedal edema, feet with tortuous toenails bilaterally Neuro: alert and oriented X3, cranial nerves II-XII grossly intact Skin: no rashes or lesions noted Psych: appropriate affect  Lab results: Basic Metabolic Panel:  Recent Labs  05/31/14 1019  NA 142  K 3.6*  CL 105  CO2 22  GLUCOSE 86  BUN 12  CREATININE 0.86  CALCIUM 9.3   Liver Function Tests: No results for input(s): AST, ALT, ALKPHOS, BILITOT, PROT, ALBUMIN in the last 72 hours. No results for  input(s): LIPASE, AMYLASE in the last 72 hours. No results for input(s): AMMONIA in the last 72 hours. CBC:  Recent Labs  05/31/14 1019  WBC 3.3*  NEUTROABS 2.1  HGB 10.3*  HCT 31.0*  MCV 91.7  PLT 150   Cardiac Enzymes: No results for input(s): CKTOTAL, CKMB, CKMBINDEX, TROPONINI in the last 72 hours. BNP:  Recent Labs  05/31/14 1019  PROBNP 367.4   D-Dimer: No results for input(s): DDIMER in the last 72 hours. CBG: No results for input(s): GLUCAP in the last 72 hours. Hemoglobin A1C: No results for input(s): HGBA1C in the last 72 hours. Fasting Lipid Panel: No results for input(s): CHOL,  HDL, LDLCALC, TRIG, CHOLHDL, LDLDIRECT in the last 72 hours. Thyroid Function Tests: No results for input(s): TSH, T4TOTAL, FREET4, T3FREE, THYROIDAB in the last 72 hours. Anemia Panel: No results for input(s): VITAMINB12, FOLATE, FERRITIN, TIBC, IRON, RETICCTPCT in the last 72 hours. Coagulation: No results for input(s): LABPROT, INR in the last 72 hours. Urine Drug Screen: Drugs of Abuse  No results found for: LABOPIA, COCAINSCRNUR, LABBENZ, AMPHETMU, THCU, LABBARB  Alcohol Level: No results for input(s): ETH in the last 72 hours. Urinalysis: No results for input(s): COLORURINE, LABSPEC, PHURINE, GLUCOSEU, HGBUR, BILIRUBINUR, KETONESUR, PROTEINUR, UROBILINOGEN, NITRITE, LEUKOCYTESUR in the last 72 hours.  Invalid input(s): APPERANCEUR  Imaging results:  Dg Chest 2 View  05/31/2014   CLINICAL DATA:  Dyspnea  EXAM: CHEST  2 VIEW  COMPARISON:  April 20, 2014  FINDINGS: There is underlying emphysematous change. There is no edema or consolidation. Heart is mildly enlarged with pulmonary vascularity within normal limits. No adenopathy. Bones are osteoporotic. There is anterior wedging of an upper thoracic vertebral body.  IMPRESSION: Underlying emphysema. No edema or consolidation. Stable cardiac enlargement.   Electronically Signed   By: Lowella Grip M.D.   On: 05/31/2014 11:44      Other results: EKG: Sinus rhythm, no ST-T wave changes compared to prior from 04/20/2014  Assessment & Plan by Problem: Active Problems:   Dyspnea  Patient is an 80 year old female with a history of heart failure with preserved ejection fraction, anxiety, dementia, hypertension, GERD who presents to the hospital for dyspnea.   Dyspnea: Acute decompensated heart failure seems less likely. Last echocardiogram from June 2014 showing left ventricular ejection fraction of 60-65% with grade 1 diastolic dysfunction and mild to moderate aortic valve regurgitation. Chest x-ray unremarkable for any consolidation or edema. EKG without any remarkable changes concerning for ischemia. Troponin negative 1. BNP of 367, down from prior of 531. Patient's weight is close to her baseline of 172 pounds. Pulmonary embolism also is less likely, Wells score of 1.5 (for tachycardia, though improved to below criteria currently). CT angiogram from 04/01/2014 negative for dissection or emboli. Patient satting well on room air. No evidence of acidosis on BMP. Patient reporting that her dyspnea is largely resolved upon interview. Some evidence of emphysema on chest x-ray, though physical exam was unremarkable. Remote history of smoking.   - Consider PFTs for further workup for COPD as outpatient.   - Continue home Lasix 20 mg twice a day  - Continue to cycle troponins  - Strict ins and outs  - Daily weights  - Repeat echocardiogram  - PT and OT evaluation  Hypokalemia: Potassium of 3.6, down from 4.1 in 04/20/2014.  - Continue home potassium 20 mEq daily  Dementia: Patient has repeatedly no showed in outpatient clinic. There is some suspicion that the patient is not able to take care of herself at home, although she states that she is able to complete all of her ADLs.  Iron deficiency anemia: Patient currently anemic though normocytic. Only iron studies from January 2009 in the system do not indicate iron  deficiency. Patient does have a concurrent leukopenia of 3.3 as well, although the patient's baseline lies on the lower limit of normal. Borderline platelet count of 150 as well.  - Continue home ferrous sulfate 325 mg daily  - Continue to monitor with serial CBC  Hypertension: Patient has ranged in the 100s to 160s over 40s to 90s since admission.  - Continue home Lasix 20 mg twice a day, losartan  50 mg daily, and metoprolol 100 mg (note that this is different from the emergency department intake dosage of 200 mg daily)  GERD:  - Continue with home PPI (omeprazole replaced with pantoprazole for hospital formulary)  Constipation:  - Continue Senokot 1 tablet daily at bedtime  Diet: Heart healthy diet Prophylaxis: Heparin sub Q 5000 u Code: Full code  Dispo: Disposition is deferred at this time, awaiting improvement of current medical problems. Anticipated discharge in approximately 1 day(s).   The patient does have a current PCP Luan Moore, MD) and does need an [facility] hospital follow-up appointment after discharge.  The patient does have transportation limitations that hinder transportation to clinic appointments.  Signed: Luan Moore, M.D., Ph.D. Internal Medicine Teaching Service, PGY-1 05/31/2014, 1:09 PM

## 2014-05-31 NOTE — Evaluation (Signed)
Physical Therapy Evaluation Patient Details Name: Jane [name] MRN: [id number] DOB: 02/01/1929 Today's Date: 05/31/2014   History of Present Illness  Patient is a 80 y/o female presents from home with c/o of dyspnea and upper back pain. Chest XRAY- underlying emphysema. PMH of HTN, HLD, CKD, CHF.    Clinical Impression  Patient presents with functional limitations due to deficits listed in PT problem list (see below). Pt with generalized weakness, balance deficits and decreased activity tolerance due to dyspnea and impaired endurance. Encouraged pt to use RW for support. Pt lives alone and has no support/assist at home. If pt can get someone to stay with her, would be able to have HHPT with 24/7 S. Pt would benefit from skilled PT to improve transfers, gait, balance and mobility so pt can maximize independence and minimize fall risk prior to return home.    Follow Up Recommendations SNF;Supervision/Assistance - 24 hour    Equipment Recommendations  None recommended by PT    Recommendations for Other Services       Precautions / Restrictions Precautions Precautions: Fall Restrictions Weight Bearing Restrictions: No      Mobility  Bed Mobility Overal bed mobility: Needs Assistance Bed Mobility: Supine to Sit;Sit to Supine     Supine to sit: Supervision;HOB elevated Sit to supine: Supervision;HOB elevated   General bed mobility comments: Use of rails for support. Supervision for safety.   Transfers Overall transfer level: Needs assistance Equipment used: None Transfers: Sit to/from Stand Sit to Stand: Min guard         General transfer comment: Min guard for safety/balance.   Ambulation/Gait Ambulation/Gait assistance: Min assist Ambulation Distance (Feet): 10 Feet (+75') Assistive device: Rolling walker (2 wheeled);None Gait Pattern/deviations: Step-through pattern;Decreased stride length;Trunk flexed Gait velocity: 1.0 ft/sec   General Gait Details: Pt with  slow, unsteady gait. VC"s for upright posture. Min A to navigate RW and difficulty with turns. Anterior lean causing increased forward momentum without use of AD requiring Min A to prevent LOB forward.  Stairs            Wheelchair Mobility    Modified Rankin (Stroke Patients Only)       Balance Overall balance assessment: Needs assistance Sitting-balance support: Feet supported;No upper extremity supported Sitting balance-Leahy Scale: Good Sitting balance - Comments: Able to reach outside BOS and fix socks without difficulty.    Standing balance support: During functional activity Standing balance-Leahy Scale: Fair Standing balance comment: Able to reach down and lower pant legs without LOB. UNsteady during gait requiring UE support to stability. Reaching for furniture within room for support, use of RW in hallway.                             Pertinent Vitals/Pain Pain Assessment: No/denies pain    Home Living Family/patient expects to be discharged to:: Private residence Living Arrangements: Alone Available Help at Discharge: Family;Available PRN/intermittently;Personal care attendant Type of Home: Apartment Home Access: Elevator     Home Layout: One level Home Equipment: Cane - single point;Walker - 2 wheels;Shower seat      Prior Function Level of Independence: Independent;Independent with assistive device(s);Needs assistance   Gait / Transfers Assistance Needed: "Furniture walks" and uses RW for community ambulation.  ADL's / Homemaking Assistance Needed: Has an aide come in 2 days/week to assist with IADLs. Reports (I) for ADLs. Uses shower chair for bathing. Cooks.        Hand  Dominance   Dominant Hand: Right    Extremity/Trunk Assessment   Upper Extremity Assessment: Defer to OT evaluation;Overall WFL for tasks assessed           Lower Extremity Assessment: Generalized weakness         Communication   Communication: No  difficulties  Cognition Arousal/Alertness: Awake/alert Behavior During Therapy: WFL for tasks assessed/performed Overall Cognitive Status: Within Functional Limits for tasks assessed                      General Comments General comments (skin integrity, edema, etc.): RHR 78 bpm, HR increased to 120 bpm.     Exercises        Assessment/Plan    PT Assessment Patient needs continued PT services  PT Diagnosis Generalized weakness;Difficulty walking   PT Problem List Decreased strength;Cardiopulmonary status limiting activity;Decreased activity tolerance;Decreased balance;Decreased safety awareness  PT Treatment Interventions Balance training;Gait training;Patient/family education;Functional mobility training;Therapeutic activities;Therapeutic exercise;DME instruction   PT Goals (Current goals can be found in the Care Plan section) Acute Rehab PT Goals Patient Stated Goal: none stated PT Goal Formulation: With patient Time For Goal Achievement: 06/14/14 Potential to Achieve Goals: Fair    Frequency Min 3X/week   Barriers to discharge Decreased caregiver support Pt lives alone    Co-evaluation               End of Session Equipment Utilized During Treatment: Gait belt Activity Tolerance: Patient limited by fatigue;Other (comment) (Dyspnea) Patient left: in bed;with call bell/phone within reach;with bed alarm set Nurse Communication: Mobility status    Functional Assessment Tool Used: Clinical judgment Functional Limitation: Mobility: Walking and moving around Mobility: Walking and Moving Around Current Status (P7106): At least 20 percent but less than 40 percent impaired, limited or restricted Mobility: Walking and Moving Around Goal Status [phone]): At least 1 percent but less than 20 percent impaired, limited or restricted    Time: 5462-7035 PT Time Calculation (min) (ACUTE ONLY): 25 min   Charges:   PT Evaluation $Initial PT Evaluation Tier I: 1  Procedure PT Treatments $Gait Training: 8-22 mins   PT G Codes:   Functional Assessment Tool Used: Clinical judgment Functional Limitation: Mobility: Walking and moving around    [place], [place] 05/31/2014, 4:57 PM  Candy Sledge, PT, DPT [phone]

## 2014-05-31 NOTE — ED Notes (Signed)
Pt undressed, in gown, on monitor, continuous pulse oximetry and blood pressure cuff; EKG performed by Peri Jefferson, RN

## 2014-05-31 NOTE — ED Notes (Signed)
Pt arrived by [facility] from home. Pt. Has c/o dyspnea and upper back pain since last night around 9:00PM.

## 2014-05-31 NOTE — ED Notes (Signed)
Pt ambulated in hallway maintaining SpO2 of 98%. Pt's pulse increased from 101bpm at onset to 122 when finishing ambulation. Pt reports dyspnea on exertion.

## 2014-06-01 DIAGNOSIS — K219 Gastro-esophageal reflux disease without esophagitis: Secondary | ICD-10-CM

## 2014-06-01 DIAGNOSIS — D509 Iron deficiency anemia, unspecified: Secondary | ICD-10-CM | POA: Diagnosis not present

## 2014-06-01 DIAGNOSIS — K5900 Constipation, unspecified: Secondary | ICD-10-CM | POA: Diagnosis not present

## 2014-06-01 DIAGNOSIS — I10 Essential (primary) hypertension: Secondary | ICD-10-CM | POA: Diagnosis not present

## 2014-06-01 DIAGNOSIS — E876 Hypokalemia: Secondary | ICD-10-CM | POA: Diagnosis not present

## 2014-06-01 DIAGNOSIS — R0600 Dyspnea, unspecified: Secondary | ICD-10-CM

## 2014-06-01 DIAGNOSIS — F0390 Unspecified dementia without behavioral disturbance: Secondary | ICD-10-CM | POA: Diagnosis not present

## 2014-06-01 DIAGNOSIS — I359 Nonrheumatic aortic valve disorder, unspecified: Secondary | ICD-10-CM | POA: Diagnosis not present

## 2014-06-01 LAB — COMPREHENSIVE METABOLIC PANEL
ALT: 6 U/L (ref 0–35)
ANION GAP: 12 (ref 5–15)
AST: 13 U/L (ref 0–37)
Albumin: 3.1 g/dL — ABNORMAL LOW (ref 3.5–5.2)
Alkaline Phosphatase: 68 U/L (ref 39–117)
BILIRUBIN TOTAL: 0.5 mg/dL (ref 0.3–1.2)
BUN: 10 mg/dL (ref 6–23)
CALCIUM: 8.8 mg/dL (ref 8.4–10.5)
CO2: 24 meq/L (ref 19–32)
CREATININE: 0.93 mg/dL (ref 0.50–1.10)
Chloride: 108 mEq/L (ref 96–112)
GFR calc non Af Amer: 55 mL/min — ABNORMAL LOW (ref >90)
GFR, EST AFRICAN AMERICAN: 64 mL/min — AB (ref >90)
Glucose, Bld: 96 mg/dL (ref 70–99)
Potassium: 3.2 mEq/L — ABNORMAL LOW (ref 3.7–5.3)
Sodium: 144 mEq/L (ref 137–147)
Total Protein: 6.4 g/dL (ref 6.0–8.3)

## 2014-06-01 LAB — LIPID PANEL
CHOL/HDL RATIO: 4.3 ratio
Cholesterol: 193 mg/dL (ref 0–200)
HDL: 45 mg/dL (ref >39)
LDL Cholesterol: 131 mg/dL — ABNORMAL HIGH (ref 0–99)
TRIGLYCERIDES: 84 mg/dL (ref <150)
VLDL: 17 mg/dL (ref 0–40)

## 2014-06-01 LAB — CBC
HCT: 29.2 % — ABNORMAL LOW (ref 36.0–46.0)
Hemoglobin: 9.5 g/dL — ABNORMAL LOW (ref 12.0–15.0)
MCH: 30.2 pg (ref 26.0–34.0)
MCHC: 32.5 g/dL (ref 30.0–36.0)
MCV: 92.7 fL (ref 78.0–100.0)
PLATELETS: 158 10*3/uL (ref 150–400)
RBC: 3.15 MIL/uL — ABNORMAL LOW (ref 3.87–5.11)
RDW: 13.3 % (ref 11.5–15.5)
WBC: 3.6 10*3/uL — AB (ref 4.0–10.5)

## 2014-06-01 LAB — HEMOGLOBIN A1C
Hgb A1c MFr Bld: 5.7 % — ABNORMAL HIGH (ref <5.7)
Mean Plasma Glucose: 117 mg/dL — ABNORMAL HIGH (ref <117)

## 2014-06-01 LAB — TROPONIN I: Troponin I: <0.3 ng/mL (ref <0.30)

## 2014-06-01 MED ORDER — POTASSIUM CHLORIDE CRYS ER 20 MEQ PO TBCR
40.0000 meq | EXTENDED_RELEASE_TABLET | Freq: Once | ORAL | Status: AC
  Administered 2014-06-01: 40 meq via ORAL
  Filled 2014-06-01: qty 2

## 2014-06-01 NOTE — Progress Notes (Signed)
Subjective:  Patient reports that she has continued to be doing well, without any significant shortness of breath. Patient only notices some shortness of breath upon exertion. Otherwise, patient is against placement in a skilled nursing facility, stating that she needs to take care of some business at home.  Objective: Vital signs in last 24 hours: Filed Vitals:   06/01/14 0423 06/01/14 0531 06/01/14 0948 06/01/14 1300  BP:  153/50 140/45 150/67  Pulse:  77 81 80  Temp:  97.6 F (36.4 C) 98.2 F (36.8 C) 98 F (36.7 C)  TempSrc:  Oral Oral Oral  Resp:  17 18 18   Height:      Weight: 167 lb 12.3 oz (76.1 kg)     SpO2:  100% 99% 100%   Weight change:   Intake/Output Summary (Last 24 hours) at 06/01/14 1705 Last data filed at 06/01/14 1419  Gross per 24 hour  Intake   1320 ml  Output   1601 ml  Net   -281 ml   General: resting in bed, in no acute distress HEENT: PERRL, EOMI, no scleral icterus, ectropion Cardiac: Tachycardic, regular rhythm, no rubs, murmurs or gallops Pulm: clear to auscultation bilaterally, moving normal volumes of air Abd: soft, nontender, nondistended, BS present Ext: warm and well perfused, no pedal edema, feet with tortuous toenails bilaterally Neuro: alert and oriented X3, cranial nerves II-XII grossly intact Skin: no rashes or lesions noted Psych: appropriate affect  Lab Results: Basic Metabolic Panel:  Recent Labs Lab 05/31/14 1019 06/01/14 0540  NA 142 144  K 3.6* 3.2*  CL 105 108  CO2 22 24  GLUCOSE 86 96  BUN 12 10  CREATININE 0.86 0.93  CALCIUM 9.3 8.8   Liver Function Tests:  Recent Labs Lab 06/01/14 0540  AST 13  ALT 6  ALKPHOS 68  BILITOT 0.5  PROT 6.4  ALBUMIN 3.1*   No results for input(s): LIPASE, AMYLASE in the last 168 hours. No results for input(s): AMMONIA in the last 168 hours. CBC:  Recent Labs Lab 05/31/14 1019 06/01/14 0540  WBC 3.3* 3.6*  NEUTROABS 2.1  --   HGB 10.3* 9.5*  HCT 31.0* 29.2*    MCV 91.7 92.7  PLT 150 158   Cardiac Enzymes:  Recent Labs Lab 05/31/14 1545 05/31/14 2116 06/01/14 0540  TROPONINI <0.30 <0.30 <0.30   BNP:  Recent Labs Lab 05/31/14 1019  PROBNP 367.4   D-Dimer: No results for input(s): DDIMER in the last 168 hours. CBG: No results for input(s): GLUCAP in the last 168 hours. Hemoglobin A1C: No results for input(s): HGBA1C in the last 168 hours. Fasting Lipid Panel:  Recent Labs Lab 06/01/14 0633  CHOL 193  HDL 45  LDLCALC 131*  TRIG 84  CHOLHDL 4.3   Thyroid Function Tests:  Recent Labs Lab 05/31/14 1545  TSH 0.923   Coagulation: No results for input(s): LABPROT, INR in the last 168 hours. Anemia Panel: No results for input(s): VITAMINB12, FOLATE, FERRITIN, TIBC, IRON, RETICCTPCT in the last 168 hours. Urine Drug Screen: Drugs of Abuse  No results found for: LABOPIA, COCAINSCRNUR, LABBENZ, AMPHETMU, THCU, LABBARB  Alcohol Level: No results for input(s): ETH in the last 168 hours. Urinalysis: No results for input(s): COLORURINE, LABSPEC, PHURINE, GLUCOSEU, HGBUR, BILIRUBINUR, KETONESUR, PROTEINUR, UROBILINOGEN, NITRITE, LEUKOCYTESUR in the last 168 hours.  Invalid input(s): APPERANCEUR  Micro Results: No results found for this or any previous visit (from the past 240 hour(s)). Studies/Results: Dg Chest 2 View  05/31/2014  CLINICAL DATA:  Dyspnea  EXAM: CHEST  2 VIEW  COMPARISON:  April 20, 2014  FINDINGS: There is underlying emphysematous change. There is no edema or consolidation. Heart is mildly enlarged with pulmonary vascularity within normal limits. No adenopathy. Bones are osteoporotic. There is anterior wedging of an upper thoracic vertebral body.  IMPRESSION: Underlying emphysema. No edema or consolidation. Stable cardiac enlargement.   Electronically Signed   By: Lowella Grip M.D.   On: 05/31/2014 11:44   Medications: I have reviewed the patient's current medications. Scheduled Meds: . aspirin EC   81 mg Oral Daily  . ferrous sulfate  325 mg Oral BID  . furosemide  20 mg Oral BID  . heparin  5,000 Units Subcutaneous 3 times per day  . losartan  50 mg Oral Daily  . metoprolol succinate  100 mg Oral Daily  . pantoprazole  40 mg Oral Daily  . sodium chloride  3 mL Intravenous Q12H   Continuous Infusions:  PRN Meds:.acetaminophen, polyvinyl alcohol Assessment/Plan: Principal Problem:   Dyspnea Active Problems:   Iron deficiency anemia   Essential hypertension   Tachycardia   Dyspnea on exertion   Patient is an 80 year old female with a history of heart failure with preserved ejection fraction, anxiety, dementia, hypertension, GERD who presents to the hospital for dyspnea.   Dyspnea: Acute decompensated heart failure seems less likely given repeat echocardiogram from this admission showing ejection fraction of 60% with mild LVH, normal chest x-ray and EKG. Troponin negative 1. BNP of 367, down from prior of 531. Patient's weight is close to her baseline of 172 pounds. Pulmonary embolism also is less likely, Wells score of 1.5 (for tachycardia, though improved to below criteria currently). CT angiogram from 04/01/2014 negative for dissection or emboli. Patient satting well on room air. No evidence of acidosis on BMP. Patient reporting that her dyspnea is largely resolved throughout her admission. Some evidence of emphysema on chest x-ray, though physical exam was unremarkable. Remote history of smoking.  - Consider PFTs for further workup for COPD as outpatient.  - Continue home Lasix 20 mg twice a day - Strict ins and outs - Daily weights  - PT OT recommending skilled nursing facility placement, though patient is resistant to this. Prefers discharge to home with aid.  Hypokalemia: Potassium of 3.6, down from 4.1 in 04/20/2014. - Continue home potassium 20 mEq daily  Dementia: Patient has repeatedly no showed in outpatient clinic. There is some suspicion that the patient is  not able to take care of herself at home, although she states that she is able to complete all of her ADLs.  Iron deficiency anemia: Patient currently anemic though normocytic. Only iron studies from January 2009 in the system do not indicate iron deficiency. Patient does have a concurrent leukopenia of 3.3 as well, although the patient's baseline lies on the lower limit of normal. Borderline platelet count of 150 as well. - Continue home ferrous sulfate 325 mg daily  - Continue to monitor as outpatient. No acute issues.   Hypertension: Patient has ranged in the 867E to 720N systolic. - Continue home Lasix 20 mg twice a day, losartan 50 mg daily, and metoprolol 100 mg (note that this is different from the emergency department intake dosage of 200 mg daily)  GERD: - Continue with home PPI (omeprazole replaced with pantoprazole for hospital formulary)  Constipation: - Continue Senokot 1 tablet daily at bedtime  Diet: Heart healthy diet Prophylaxis: Heparin sub Q 5000 u Code: Full  code  Dispo: Disposition is deferred at this time, awaiting improvement of current medical problems. Anticipated discharge in approximately 1 day(s).   The patient does have a current PCP Luan Moore, MD) and does need an [facility] hospital follow-up appointment after discharge.  The patient does have transportation limitations that hinder transportation to clinic appointments.  .Services Needed at time of discharge: Y = Yes, Blank = No PT:   OT:   RN:   Equipment:   Other:     LOS: 1 day   Luan Moore, MD 06/01/2014, 5:05 PM

## 2014-06-01 NOTE — Progress Notes (Signed)
  Echocardiogram 2D Echocardiogram has been performed.  Jane [name] 06/01/2014, 8:58 AM

## 2014-06-01 NOTE — Progress Notes (Signed)
UR completed 

## 2014-06-02 DIAGNOSIS — I10 Essential (primary) hypertension: Secondary | ICD-10-CM | POA: Diagnosis not present

## 2014-06-02 DIAGNOSIS — K5900 Constipation, unspecified: Secondary | ICD-10-CM | POA: Diagnosis not present

## 2014-06-02 DIAGNOSIS — F0390 Unspecified dementia without behavioral disturbance: Secondary | ICD-10-CM | POA: Diagnosis not present

## 2014-06-02 DIAGNOSIS — D509 Iron deficiency anemia, unspecified: Secondary | ICD-10-CM | POA: Diagnosis not present

## 2014-06-02 DIAGNOSIS — E876 Hypokalemia: Secondary | ICD-10-CM | POA: Diagnosis not present

## 2014-06-02 DIAGNOSIS — R0600 Dyspnea, unspecified: Secondary | ICD-10-CM | POA: Diagnosis not present

## 2014-06-02 DIAGNOSIS — K219 Gastro-esophageal reflux disease without esophagitis: Secondary | ICD-10-CM | POA: Diagnosis not present

## 2014-06-02 MED ORDER — METOPROLOL SUCCINATE ER 100 MG PO TB24: 100.0000 mg | ORAL_TABLET | Freq: Every day | ORAL | Status: DC

## 2014-06-02 MED ORDER — POTASSIUM CHLORIDE CRYS ER 20 MEQ PO TBCR
20.0000 meq | EXTENDED_RELEASE_TABLET | Freq: Every day | ORAL | Status: DC
  Administered 2014-06-02: 20 meq via ORAL
  Filled 2014-06-02: qty 1

## 2014-06-02 NOTE — Progress Notes (Signed)
Subjective:  Patient continues to do well with her breathing. Patient states that she would prefer to go home to take care of some business prefer not to go to a facility for rehabilitation.   Objective: Vital signs in last 24 hours: Filed Vitals:   06/01/14 1300 06/01/14 1956 06/02/14 0059 06/02/14 0503  BP: 150/67 130/60 164/55 169/61  Pulse: 80 68 74 72  Temp: 98 F (36.7 C) 98 F (36.7 C) 98 F (36.7 C) 98 F (36.7 C)  TempSrc: Oral Oral Oral Oral  Resp: 18 18 16 18   Height:      Weight:    167 lb 8.8 oz (76 kg)  SpO2: 100% 100% 96% 98%   Weight change: -4 lb 7.2 oz (-2.019 kg)  Intake/Output Summary (Last 24 hours) at 06/02/14 0656 Last data filed at 06/02/14 0636  Gross per 24 hour  Intake    360 ml  Output   1550 ml  Net  -1190 ml   General: resting in bed, in no acute distress, eating breakfast comfortably in bed HEENT: PERRL, EOMI, no scleral icterus, ectropion Cardiac: Tachycardic, regular rhythm, no rubs, murmurs or gallops Pulm: clear to auscultation bilaterally, moving normal volumes of air Abd: soft, nontender, nondistended, BS present Ext: warm and well perfused, no pedal edema, feet with tortuous toenails bilaterally Neuro: alert and oriented X3, cranial nerves II-XII grossly intact Skin: no rashes or lesions noted Psych: appropriate affect  Lab Results: Basic Metabolic Panel:  Recent Labs Lab 05/31/14 1019 06/01/14 0540  NA 142 144  K 3.6* 3.2*  CL 105 108  CO2 22 24  GLUCOSE 86 96  BUN 12 10  CREATININE 0.86 0.93  CALCIUM 9.3 8.8   Liver Function Tests:  Recent Labs Lab 06/01/14 0540  AST 13  ALT 6  ALKPHOS 68  BILITOT 0.5  PROT 6.4  ALBUMIN 3.1*   No results for input(s): LIPASE, AMYLASE in the last 168 hours. No results for input(s): AMMONIA in the last 168 hours. CBC:  Recent Labs Lab 05/31/14 1019 06/01/14 0540  WBC 3.3* 3.6*  NEUTROABS 2.1  --   HGB 10.3* 9.5*  HCT 31.0* 29.2*  MCV 91.7 92.7  PLT 150 158    Cardiac Enzymes:  Recent Labs Lab 05/31/14 1545 05/31/14 2116 06/01/14 0540  TROPONINI <0.30 <0.30 <0.30   BNP:  Recent Labs Lab 05/31/14 1019  PROBNP 367.4   D-Dimer: No results for input(s): DDIMER in the last 168 hours. CBG: No results for input(s): GLUCAP in the last 168 hours. Hemoglobin A1C:  Recent Labs Lab 06/01/14 0540  HGBA1C 5.7*   Fasting Lipid Panel:  Recent Labs Lab 06/01/14 0633  CHOL 193  HDL 45  LDLCALC 131*  TRIG 84  CHOLHDL 4.3   Thyroid Function Tests:  Recent Labs Lab 05/31/14 1545  TSH 0.923   Coagulation: No results for input(s): LABPROT, INR in the last 168 hours. Anemia Panel: No results for input(s): VITAMINB12, FOLATE, FERRITIN, TIBC, IRON, RETICCTPCT in the last 168 hours. Urine Drug Screen: Drugs of Abuse  No results found for: LABOPIA, COCAINSCRNUR, LABBENZ, AMPHETMU, THCU, LABBARB  Alcohol Level: No results for input(s): ETH in the last 168 hours. Urinalysis: No results for input(s): COLORURINE, LABSPEC, PHURINE, GLUCOSEU, HGBUR, BILIRUBINUR, KETONESUR, PROTEINUR, UROBILINOGEN, NITRITE, LEUKOCYTESUR in the last 168 hours.  Invalid input(s): APPERANCEUR  Micro Results: No results found for this or any previous visit (from the past 240 hour(s)). Studies/Results: Dg Chest 2 View  05/31/2014  CLINICAL DATA:  Dyspnea  EXAM: CHEST  2 VIEW  COMPARISON:  April 20, 2014  FINDINGS: There is underlying emphysematous change. There is no edema or consolidation. Heart is mildly enlarged with pulmonary vascularity within normal limits. No adenopathy. Bones are osteoporotic. There is anterior wedging of an upper thoracic vertebral body.  IMPRESSION: Underlying emphysema. No edema or consolidation. Stable cardiac enlargement.   Electronically Signed   By: Lowella Grip M.D.   On: 05/31/2014 11:44   Medications: I have reviewed the patient's current medications. Scheduled Meds: . aspirin EC  81 mg Oral Daily  . ferrous  sulfate  325 mg Oral BID  . furosemide  20 mg Oral BID  . heparin  5,000 Units Subcutaneous 3 times per day  . losartan  50 mg Oral Daily  . metoprolol succinate  100 mg Oral Daily  . pantoprazole  40 mg Oral Daily  . sodium chloride  3 mL Intravenous Q12H   Continuous Infusions:  PRN Meds:.acetaminophen, polyvinyl alcohol Assessment/Plan: Principal Problem:   Dyspnea Active Problems:   Iron deficiency anemia   Essential hypertension   Tachycardia   Dyspnea on exertion   Patient is an 80 year old female with a history of heart failure with preserved ejection fraction, anxiety, dementia, hypertension, GERD who presents to the hospital for dyspnea of uncertain etiology.   Dyspnea: Acute decompensated heart failure seems less likely given repeat echocardiogram from this admission showing ejection fraction of 60% with mild LVH, normal chest x-ray and EKG. Troponin negative 1. BNP of 367, down from prior of 531. Patient's weight is close to her baseline of 172 pounds. Pulmonary embolism also is less likely, Wells score of 1.5 (for tachycardia, though improved to below criteria currently). CT angiogram from 04/01/2014 negative for dissection or emboli. Patient satting well on room air. No evidence of acidosis on BMP. Patient reporting that her dyspnea is largely resolved throughout her admission. Some evidence of emphysema on chest x-ray, though physical exam was unremarkable. Remote history of smoking.    - Consider PFTs for further workup for COPD as outpatient.    - Continue home Lasix 20 mg twice a day  - Strict ins and outs  - Daily weights  - PT OT recommending skilled nursing facility placement, though patient is resistant to this. Prefers discharge to home with aid.  Hypokalemia: Potassium of 3.2.   - Continue home potassium 20 mEq daily  Dementia: Patient has repeatedly no showed in outpatient clinic. There is some suspicion that the patient is not able to take care of herself at  home, although she states that she is able to complete all of her ADLs.  Iron deficiency anemia: Patient currently anemic though normocytic. Only iron studies from January 2009 in the system do not indicate iron deficiency. Patient does have a concurrent leukopenia of 3.3 as well, although the patient's baseline lies on the lower limit of normal. Borderline platelet count of 150 as well.  - Continue home ferrous sulfate 325 mg daily. Reassess need as an outpatient.   - Continue to monitor as outpatient. No acute issues.   Hypertension: Patient has been in the 428J systolic.   - Continue home Lasix 20 mg twice a day, losartan 50 mg daily, and metoprolol 100 mg (note that this is different from the emergency department intake dosage of 200 mg daily). - Consider up titration or addition of calcium channel blocker as an outpatient (which the patient has never been on according to our record).  GERD:  - Continue with home PPI (omeprazole replaced with pantoprazole for hospital formulary)  Constipation:  - Continue Senokot 1 tablet daily at bedtime  Diet: Heart healthy diet Prophylaxis: Heparin sub Q 5000 u Code: Full code  Dispo: Anticipated discharge is today.  The patient does have a current PCP Luan Moore, MD) and does need an [facility] hospital follow-up appointment after discharge.  The patient does have transportation limitations that hinder transportation to clinic appointments.  .Services Needed at time of discharge: Y = Yes, Blank = No PT:   OT:   RN:   Equipment:   Other:     LOS: 2 days   Luan Moore, MD 06/02/2014, 6:56 AM

## 2014-06-02 NOTE — Discharge Instructions (Signed)
Please continue taking all of your medications as prescribed. Note that your metoprolol dose is 100 mg daily. We have scheduled a follow-up appointment with your primary care provider (Dr. Raelene Bott) on 06/06/2014 at 3:45 PM at the [facility] internal [place].

## 2014-06-02 NOTE — Progress Notes (Signed)
UR completed 

## 2014-06-02 NOTE — Progress Notes (Signed)
Physical Therapy Treatment Patient Details Name: Jane [name] MRN: [id number] DOB: January 26, 1929 Today's Date: 2014-06-10    History of Present Illness Patient is a 80 y/o female presents from home with c/o of dyspnea and upper back pain. Chest XRAY- underlying emphysema. PMH of HTN, HLD, CKD, CHF.    PT Comments    Pt making steady progress. Pt not interested in SNF and is going to return home.  Follow Up Recommendations  Home health PT;Supervision - Intermittent (PT refusing SNF and is returning home.)     Equipment Recommendations  None recommended by PT    Recommendations for Other Services       Precautions / Restrictions Precautions Precautions: Fall    Mobility  Bed Mobility                  Transfers Overall transfer level: Modified independent   Transfers: Sit to/from Stand Sit to Stand: Modified independent (Device/Increase time)         General transfer comment: Incr time  Ambulation/Gait Ambulation/Gait assistance: Supervision Ambulation Distance (Feet): 200 Feet Assistive device: Rolling walker (2 wheeled) Gait Pattern/deviations: Step-through pattern;Decreased stride length;Trunk flexed     General Gait Details: Unsteady but with walker no loss of balance.   Stairs            Wheelchair Mobility    Modified Rankin (Stroke Patients Only)       Balance Overall balance assessment: Needs assistance Sitting-balance support: No upper extremity supported Sitting balance-Leahy Scale: Good     Standing balance support: No upper extremity supported;During functional activity Standing balance-Leahy Scale: Fair                      Cognition Arousal/Alertness: Awake/alert Behavior During Therapy: WFL for tasks assessed/performed Overall Cognitive Status: Within Functional Limits for tasks assessed                      Exercises      General Comments        Pertinent Vitals/Pain Pain Assessment: No/denies  pain    Home Living                      Prior Function            PT Goals (current goals can now be found in the care plan section) Progress towards PT goals: Progressing toward goals    Frequency  Min 3X/week    PT Plan Discharge plan needs to be updated    Co-evaluation             End of Session   Activity Tolerance: Patient tolerated treatment well Patient left: in bed;with call bell/phone within reach     Time: 1142-1151 PT Time Calculation (min) (ACUTE ONLY): 9 min  Charges:                       G Codes:      Jane [name] 2014-06-10, 12:04 PM  [facility] PT [phone]

## 2014-06-02 NOTE — Plan of Care (Signed)
Problem: Phase I Progression Outcomes Goal: Voiding-avoid urinary catheter unless indicated Outcome: Completed/Met Date Met:  06/02/14

## 2014-06-02 NOTE — Care Management Note (Signed)
    Page 1 of 2   06/02/2014     3:13:55 PM CARE MANAGEMENT NOTE 06/02/2014  Patient:  Jane [name], Jane [name]   Account Number:  [id number]  Date Initiated:  06/02/2014  Documentation initiated by:  Mariann Laster  Subjective/Objective Assessment:   dyspnea     Action/Plan:   CM to follow for disposition needs   Anticipated DC Date:  06/02/2014   Anticipated DC Plan:  [place]  In-house referral  Clinical Social Worker      DC Forensic scientist  CM consult      [facility] Choice  HOME HEALTH   Choice offered to / List presented to:  C-1 Patient        [place] arranged  HH-1 RN  [place]      [place].   Status of service:  Completed, signed off Medicare Important Message given?  NO (If response is "NO", the following Medicare IM given date fields will be blank) Date Medicare IM given:   Medicare IM given by:   Date Additional Medicare IM given:   Additional Medicare IM given by:    Discharge Disposition:  [place]  Per UR Regulation:    If discussed at Long Length of Stay Meetings, dates discussed:    Comments:  Sultana Tierney RN, BSN, MSHL, CCM  Nurse - Case Manager,  ([facility] [place])  [phone]  06/02/2014 Hx/o 1 admission and 4 ER visits over the past 6 months Social:  Home / alone PT RECS:  SNF but patient refuses and wants to d/c home with HHS. Dispo Plan:  home with HHS: RN, PT, SW ([facility] notified)

## 2014-06-02 NOTE — Progress Notes (Addendum)
CSW order received to assist patient with SNF placement per PT recommendation. CSW met with patient to discuss this process. She currently lives alone and states that she is very worried about needing to return home to pay her rent and other monthly bills as she does not have a designee to help her with this and does not want to loose her apartment.  CSW spoke in depth with her about need to consider setting up a designee in the future in the event that she unable to pay her bills and she agreed to "consider" this option. She stated that the only person she could consider would be her sister who lives in the same apartment complex. Patient defers SNF placement at this time and wants to return home. She is observation status and would not have a Medicare 3 day inpatient qualifying stay for Medicare coverage either.  She will not agree to stay for 30 days as required by Medicaid and cannot pay privately at the facility. She will agree to Home Health services and recommend maximum services be placed- RN, PT, Aide and Education officer, museum.  Above discussed with MD and RNCM.  Patient feels that she will have transportation home once d/c is confirmed. CSW signing off but available if needed.  Lorie Phenix. Pauline Good, [place]

## 2014-06-02 NOTE — Progress Notes (Signed)
CSW met with patient and spoke to her sister Mrs. Aida Puffer and son Legrand Como via telephone to arrange transportation home.  Patient's daughter-in-law- Hassan Rowan gets off work at 5 pm and the plan is for her to pick up patient around 6 pm.  Patient is agreeable; Maxwell Marion, RN notified.  Should this arrangement fail, Chantha was provided with phone number for [address] [place]  [phone] to provide a cab voucher for a ride home.  CSW signing off. Lorie Phenix. Pauline Good, [place]

## 2014-06-02 NOTE — Discharge Summary (Signed)
Name: Jane [name] MRN: [id number] DOB: 09/11/1928 80 y.o. PCP: Luan Moore, MD  Date of Admission: 05/31/2014  9:52 AM Date of Discharge: 06/02/2014 Attending Physician: No att. providers found Dr. Ellwood Dense  Discharge Diagnosis: Principal Problem:   Dyspnea Active Problems:   Iron deficiency anemia   Essential hypertension   Tachycardia   Dyspnea on exertion  Discharge Medications:   Medication List    STOP taking these medications        diphenhydrAMINE 25 mg capsule  Commonly known as:  BENADRYL      TAKE these medications        acetaminophen 325 MG tablet  Commonly known as:  TYLENOL  Take 650 mg by mouth every 6 (six) hours as needed for mild pain.     aspirin EC 81 MG tablet  Take 81 mg by mouth daily.     ferrous sulfate 325 (65 FE) MG tablet  take 1 tablet by mouth twice a day     furosemide 20 MG tablet  Commonly known as:  LASIX  Take 20 mg by mouth 2 (two) times daily.     hydroxypropyl methylcellulose / hypromellose 2.5 % ophthalmic solution  Commonly known as:  ISOPTO TEARS / GONIOVISC  Place 1 drop into the left eye 4 (four) times daily as needed for dry eyes.     losartan 50 MG tablet  Commonly known as:  COZAAR  Take 50 mg by mouth daily.     metoprolol succinate 100 MG 24 hr tablet  Commonly known as:  TOPROL-XL  Take 1 tablet (100 mg total) by mouth daily. Take with or immediately following a meal.     omeprazole 20 MG capsule  Commonly known as:  PRILOSEC  Take 1 capsule (20 mg total) by mouth 2 (two) times daily before a meal.     potassium chloride SA 20 MEQ tablet  Commonly known as:  K-DUR,KLOR-CON  Take 20 mEq by mouth daily.     sennosides-docusate sodium 8.6-50 MG tablet  Commonly known as:  SENOKOT-S  Take 1 tablet by mouth at bedtime.        Disposition and follow-up:   [name] was discharged from [facility] in Stable condition.  At the hospital follow up visit please address:  1.   Consider outpatient PFTs and/or cardiac stress test. Assess utility of home health services provided upon discharge.   2.  Labs / imaging needed at time of follow-up: none  3.  Pending labs/ test needing follow-up: none  Follow-up Appointments:     Follow-up Information    Follow up with Luan Moore, MD On 06/06/2014.   Specialty:  Internal Medicine   Why:  @ 3:45 PM per Maye   Contact information:   [place] [address] [phone]       Follow up with [facility].   Why:  Registered Nurse, Physical Therapy, Social Work services to start within 24-48 hours of d/c   Contact information:   [address] [place] [address] [phone]       Discharge Instructions: Discharge Instructions    Call MD for:  difficulty breathing, headache or visual disturbances    Complete by:  As directed      Call MD for:  extreme fatigue    Complete by:  As directed      Call MD for:  hives    Complete by:  As directed  Call MD for:  persistant dizziness or light-headedness    Complete by:  As directed      Call MD for:  persistant nausea and vomiting    Complete by:  As directed      Call MD for:  redness, tenderness, or signs of infection (pain, swelling, redness, odor or green/yellow discharge around incision site)    Complete by:  As directed      Call MD for:  severe uncontrolled pain    Complete by:  As directed      Call MD for:  temperature >100.4    Complete by:  As directed      Diet - low sodium heart healthy    Complete by:  As directed      Increase activity slowly    Complete by:  As directed            Consultations:    Procedures Performed:  Dg Chest 2 View  05/31/2014   CLINICAL DATA:  Dyspnea  EXAM: CHEST  2 VIEW  COMPARISON:  April 20, 2014  FINDINGS: There is underlying emphysematous change. There is no edema or consolidation. Heart is mildly enlarged with pulmonary vascularity within normal limits. No  adenopathy. Bones are osteoporotic. There is anterior wedging of an upper thoracic vertebral body.  IMPRESSION: Underlying emphysema. No edema or consolidation. Stable cardiac enlargement.   Electronically Signed   By: Lowella Grip M.D.   On: 05/31/2014 11:44   Admission HPI:   Patient is an 81 year old female with a history of heart failure with preserved ejection fraction, anxiety, dementia, hypertension, GERD who presents to the hospital for dyspnea. Patient reports that the dyspnea started around yesterday and is associated with some exertion in getting to the bathroom. Patient denies any associated chest pain, hemoptysis, orthopnea, productive cough, or recent immobilization. Patient is also reporting some diffuse upper and lower back pain that she says has been going on for several months. Patient states that the pain is moderate in severity and is a burning sensation that is alleviated with omeprazole. Patient denies any association between the dyspnea and the back pain. Patient denies any history of malignancy or calf pain. Currently upon interview on the floor, patient reports that her dyspnea has mostly resolved. Patient otherwise denies any fevers, chills, nausea, vomiting, abdominal pain, constipation, dysuria, or hematuria.  Upon review of the chart, patient has been repeatedly evaluated emergency department for similar symptoms. She has consistently been ruled out for cardiac etiologies and for pulmonary embolism. She lives at home alone and does not have family that checks in on her regularly. Patient has also missed repeated appointments in outpatient clinic with her PCP. When question as to the reason that she misses these appointments, patient states that she simply did not feel like getting dressed and leaving for these appointments. Patient states that she does have transportation set up for her to get to these appointments.  Hospital Course by problem list: Principal Problem:    Dyspnea Active Problems:   Iron deficiency anemia   Essential hypertension   Tachycardia   Dyspnea on exertion   Dyspnea: Patient was initially admitted for dyspnea on exertion when trying to go to the bathroom. Upon initial evaluation by the admitting team, patient symptoms have largely resolved. At that time, the only thing that was remarkable was sinus tachycardia, though the patient has recently walked to and from the bathroom in her hospital room. Acute decompensated heart failure seemed less likely given  repeat echocardiogram from the admission showing ejection fraction of 60% and mild LVH. Patient also had a normal chest x-ray and EKG with negative troponins. Patient's BNP was actually down 367 from the prior 531. Patient's weight was close to her baseline of 172 pounds. Pulmonary embolism was also thought to be less likely with a Well's of 1.5. Patient also had a prior CT angiogram from a previous ED evaluation on April 01, 2014 negative for any dissection or evidence of emboli. Upon initial exam, patient was satting well in the air without any evidence of acidosis on lab work.Chest x-ray was only remarkable for evidence of emphysema, and patient has a remote history of smoking. What consider outpatient follow-up PFTs or stress test. Physical therapy and occupational therapy recommended a skilled nursing facility for patient placement, though the patient was resistant to this, opting to go home instead.  Hypokalemia: Patients home dose of potassium 20 mEq daily was continued.  - Continue home potassium 20 mEq daily  Dementia: Patient has repeatedly no-showed at outpatient clinic and showed some signs of confusion during this admission. However, this was overall mild and patient still had capacity to make her own decisions, and states that she is able to complete all of her ADLs at home. Unfortunately, although a nursing facility would probably be ideal for her, she preferred to go home with home  health aid instead.  Iron deficiency anemia: Patient currently anemic though normocytic. Only iron studies from January 2009 in the system do not indicate iron deficiency. Patient does have a concurrent leukopenia of 3.3 as well, although the patient's baseline lies on the lower limit of normal. Borderline platelet count of 150 as well. Continued home ferrous sulfate 325 mg daily. Reassess need as an outpatient.    Hypertension: Patient had been largely normotensive during Her admission, although her blood pressure became mild to moderately elevated on the day of discharge. Patient was continued on home Lasix 20 mg twice a day, losartan 50 mg daily and metoprolol 100 mg daily (Note that this is a different dosage from what was previously in the system from the emergency department intake dosage of 200 mg daily). Consider uptitration of these medications or the addition of a calcium channel blocker (does not seem like patient has ever been on one according to our records) as an outpatient.  GERD: Continued home PPI.  Constipation: Continued home Senokot one tablet daily at bedtime  Discharge Vitals:   BP 174/66 mmHg  Pulse 72  Temp(Src) 98.2 F (36.8 C) (Oral)  Resp 18  Ht 5\' 3"  (1.6 m)  Wt 167 lb 8.8 oz (76 kg)  BMI 29.69 kg/m2  SpO2 99%  Discharge Labs:  No results found for this or any previous visit (from the past 24 hour(s)).  Signed: Luan Moore, MD 06/02/2014, 7:32 PM

## 2014-06-02 NOTE — Plan of Care (Signed)
Problem: Phase I Progression Outcomes Goal: Dyspnea controlled at rest (HF) Outcome: Completed/Met Date Met:  06/02/14 Goal: Pain controlled with appropriate interventions Outcome: Completed/Met Date Met:  06/02/14 Goal: EF % per last Echo/documented,Core Reminder form on chart Outcome: Completed/Met Date Met:  06/02/14 EF = 60% per ECHO performed on 06/01/14. Goal: Up in chair, BRP Outcome: Completed/Met Date Met:  06/02/14 Goal: Initial discharge plan identified Outcome: Completed/Met Date Met:  06/02/14 Goal: Hemodynamically stable Outcome: Completed/Met Date Met:  06/02/14

## 2014-06-02 NOTE — Progress Notes (Signed)
Occupational Therapy Evaluation Patient Details Name: Jane [name] MRN: [id number] DOB: [birth year] Today's Date: 06/02/2014    History of Present Illness Patient is a 80 y/o female presents from home with c/o of dyspnea and upper back pain. Chest XRAY- underlying emphysema. PMH of HTN, HLD, CKD, CHF.   Clinical Impression   Patient appears to be at baseline with ADLs and is to discharge home today (d/c orders already on the chart). No further acute OT needs at this time.    Follow Up Recommendations  No OT follow up;Other (comment) (aide as PTA)    Equipment Recommendations  None recommended by OT    Recommendations for Other Services       Precautions / Restrictions Precautions Precautions: Fall Restrictions Weight Bearing Restrictions: No      Mobility Bed Mobility                  Transfers Overall transfer level: Modified independent Equipment used: Rolling walker (2 wheeled) Transfers: Sit to/from Stand Sit to Stand: Modified independent (Device/Increase time)         General transfer comment: Incr time    Balance Overall balance assessment: Needs assistance Sitting-balance support: No upper extremity supported Sitting balance-Leahy Scale: Good     Standing balance support: No upper extremity supported;During functional activity Standing balance-Leahy Scale: Fair                              ADL Overall ADL's : Modified independent                                             Vision                     Perception     Praxis      Pertinent Vitals/Pain Pain Assessment: No/denies pain     Hand Dominance Right   Extremity/Trunk Assessment Upper Extremity Assessment Upper Extremity Assessment: Overall WFL for tasks assessed   Lower Extremity Assessment Lower Extremity Assessment: Defer to PT evaluation       Communication Communication Communication: No difficulties   Cognition  Arousal/Alertness: Awake/alert Behavior During Therapy: WFL for tasks assessed/performed Overall Cognitive Status: Within Functional Limits for tasks assessed       Memory:  (history of dementia per chart)             General Comments       Exercises       Shoulder Instructions      Home Living Family/patient expects to be discharged to:: Private residence Living Arrangements: Alone Available Help at Discharge: Family;Available PRN/intermittently;Personal care attendant Type of Home: Apartment Home Access: Elevator     Home Layout: One level     Bathroom Shower/Tub: Teacher,  years/pre: Standard     Home Equipment: Cane - single point;Walker - 2 wheels;Shower seat   Additional Comments: does own laundry, housework, cooking per patient. Does not drive.      Prior Functioning/Environment Level of Independence: Independent;Independent with assistive device(s);Needs assistance  Gait / Transfers Assistance Needed: "Furniture walks" and uses RW for community ambulation. ADL's / Homemaking Assistance Needed: Has an aide come in 2 days/week to assist with IADLs. Reports (I) for ADLs. Uses shower chair for bathing. Cooks.  OT Diagnosis: Generalized weakness   OT Problem List: Decreased activity tolerance   OT Treatment/Interventions:      OT Goals(Current goals can be found in the care plan section)    OT Frequency:     Barriers to D/C:            Co-evaluation              End of Session Equipment Utilized During Treatment: Rolling walker Nurse Communication: Mobility status  Activity Tolerance: Patient tolerated treatment well Patient left: in bed;with call bell/phone within reach;with nursing/sitter in room   Time: 1343-1404 OT Time Calculation (min): 21 min Charges:  OT General Charges $OT Visit: 1 Procedure OT Evaluation $Initial OT Evaluation Tier I: 1 Procedure OT Treatments $Self Care/Home Management : 8-22  mins G-Codes: OT G-codes **NOT FOR INPATIENT CLASS** Functional Assessment Tool Used: clinical observation Functional Limitation: Self care Self Care Current Status (R7408): At least 1 percent but less than 20 percent impaired, limited or restricted Self Care Goal Status (X4481): At least 1 percent but less than 20 percent impaired, limited or restricted Self Care Discharge Status [phone]): At least 1 percent but less than 20 percent impaired, limited or restricted  Twinkle Sockwell A 06/02/2014, 3:19 PM

## 2014-06-06 ENCOUNTER — Encounter: Payer: Medicare Other | Admitting: Internal Medicine

## 2014-06-07 ENCOUNTER — Encounter: Payer: Self-pay | Admitting: Internal Medicine

## 2014-06-07 DIAGNOSIS — K219 Gastro-esophageal reflux disease without esophagitis: Secondary | ICD-10-CM | POA: Diagnosis not present

## 2014-06-07 DIAGNOSIS — I509 Heart failure, unspecified: Secondary | ICD-10-CM | POA: Diagnosis not present

## 2014-06-07 DIAGNOSIS — N183 Chronic kidney disease, stage 3 (moderate): Secondary | ICD-10-CM | POA: Diagnosis not present

## 2014-06-07 DIAGNOSIS — F0390 Unspecified dementia without behavioral disturbance: Secondary | ICD-10-CM | POA: Diagnosis not present

## 2014-06-07 DIAGNOSIS — R5381 Other malaise: Secondary | ICD-10-CM | POA: Diagnosis not present

## 2014-06-07 DIAGNOSIS — F419 Anxiety disorder, unspecified: Secondary | ICD-10-CM | POA: Diagnosis not present

## 2014-06-07 DIAGNOSIS — Z87891 Personal history of nicotine dependence: Secondary | ICD-10-CM | POA: Diagnosis not present

## 2014-06-07 DIAGNOSIS — I129 Hypertensive chronic kidney disease with stage 1 through stage 4 chronic kidney disease, or unspecified chronic kidney disease: Secondary | ICD-10-CM | POA: Diagnosis not present

## 2014-06-08 DIAGNOSIS — I509 Heart failure, unspecified: Secondary | ICD-10-CM | POA: Diagnosis not present

## 2014-06-08 DIAGNOSIS — F419 Anxiety disorder, unspecified: Secondary | ICD-10-CM | POA: Diagnosis not present

## 2014-06-08 DIAGNOSIS — R5381 Other malaise: Secondary | ICD-10-CM | POA: Diagnosis not present

## 2014-06-08 DIAGNOSIS — F0390 Unspecified dementia without behavioral disturbance: Secondary | ICD-10-CM | POA: Diagnosis not present

## 2014-06-08 DIAGNOSIS — I129 Hypertensive chronic kidney disease with stage 1 through stage 4 chronic kidney disease, or unspecified chronic kidney disease: Secondary | ICD-10-CM | POA: Diagnosis not present

## 2014-06-08 DIAGNOSIS — N183 Chronic kidney disease, stage 3 (moderate): Secondary | ICD-10-CM | POA: Diagnosis not present

## 2014-06-10 DIAGNOSIS — I509 Heart failure, unspecified: Secondary | ICD-10-CM | POA: Diagnosis not present

## 2014-06-10 DIAGNOSIS — F0390 Unspecified dementia without behavioral disturbance: Secondary | ICD-10-CM | POA: Diagnosis not present

## 2014-06-10 DIAGNOSIS — N183 Chronic kidney disease, stage 3 (moderate): Secondary | ICD-10-CM | POA: Diagnosis not present

## 2014-06-10 DIAGNOSIS — I129 Hypertensive chronic kidney disease with stage 1 through stage 4 chronic kidney disease, or unspecified chronic kidney disease: Secondary | ICD-10-CM | POA: Diagnosis not present

## 2014-06-10 DIAGNOSIS — F419 Anxiety disorder, unspecified: Secondary | ICD-10-CM | POA: Diagnosis not present

## 2014-06-10 DIAGNOSIS — R5381 Other malaise: Secondary | ICD-10-CM | POA: Diagnosis not present

## 2014-06-12 ENCOUNTER — Encounter (HOSPITAL_COMMUNITY): Payer: Self-pay | Admitting: Emergency Medicine

## 2014-06-12 ENCOUNTER — Emergency Department (HOSPITAL_COMMUNITY): Payer: Medicare Other

## 2014-06-12 ENCOUNTER — Emergency Department (HOSPITAL_COMMUNITY)
Admission: EM | Admit: 2014-06-12 | Discharge: 2014-06-12 | Disposition: A | Discharge status: Home / Self Care | Payer: Medicare Other | Attending: Emergency Medicine | Admitting: Emergency Medicine

## 2014-06-12 DIAGNOSIS — N183 Chronic kidney disease, stage 3 (moderate): Secondary | ICD-10-CM | POA: Diagnosis not present

## 2014-06-12 DIAGNOSIS — R0602 Shortness of breath: Secondary | ICD-10-CM

## 2014-06-12 DIAGNOSIS — D649 Anemia, unspecified: Secondary | ICD-10-CM | POA: Diagnosis not present

## 2014-06-12 DIAGNOSIS — Z8673 Personal history of transient ischemic attack (TIA), and cerebral infarction without residual deficits: Secondary | ICD-10-CM | POA: Diagnosis not present

## 2014-06-12 DIAGNOSIS — Z8659 Personal history of other mental and behavioral disorders: Secondary | ICD-10-CM | POA: Insufficient documentation

## 2014-06-12 DIAGNOSIS — J8410 Pulmonary fibrosis, unspecified: Secondary | ICD-10-CM | POA: Diagnosis not present

## 2014-06-12 DIAGNOSIS — Z79899 Other long term (current) drug therapy: Secondary | ICD-10-CM | POA: Insufficient documentation

## 2014-06-12 DIAGNOSIS — Z87891 Personal history of nicotine dependence: Secondary | ICD-10-CM | POA: Insufficient documentation

## 2014-06-12 DIAGNOSIS — Z7982 Long term (current) use of aspirin: Secondary | ICD-10-CM | POA: Diagnosis not present

## 2014-06-12 DIAGNOSIS — Z8639 Personal history of other endocrine, nutritional and metabolic disease: Secondary | ICD-10-CM | POA: Insufficient documentation

## 2014-06-12 DIAGNOSIS — R0600 Dyspnea, unspecified: Secondary | ICD-10-CM | POA: Insufficient documentation

## 2014-06-12 DIAGNOSIS — M1990 Unspecified osteoarthritis, unspecified site: Secondary | ICD-10-CM | POA: Insufficient documentation

## 2014-06-12 DIAGNOSIS — I129 Hypertensive chronic kidney disease with stage 1 through stage 4 chronic kidney disease, or unspecified chronic kidney disease: Secondary | ICD-10-CM | POA: Diagnosis not present

## 2014-06-12 DIAGNOSIS — I509 Heart failure, unspecified: Secondary | ICD-10-CM | POA: Insufficient documentation

## 2014-06-12 DIAGNOSIS — K219 Gastro-esophageal reflux disease without esophagitis: Secondary | ICD-10-CM | POA: Insufficient documentation

## 2014-06-12 DIAGNOSIS — J439 Emphysema, unspecified: Secondary | ICD-10-CM | POA: Diagnosis not present

## 2014-06-12 DIAGNOSIS — I6789 Other cerebrovascular disease: Secondary | ICD-10-CM | POA: Diagnosis not present

## 2014-06-12 DIAGNOSIS — Z86010 Personal history of colonic polyps: Secondary | ICD-10-CM | POA: Insufficient documentation

## 2014-06-12 LAB — CBC
HEMATOCRIT: 31.3 % — AB (ref 36.0–46.0)
HEMOGLOBIN: 10.2 g/dL — AB (ref 12.0–15.0)
MCH: 30.3 pg (ref 26.0–34.0)
MCHC: 32.6 g/dL (ref 30.0–36.0)
MCV: 92.9 fL (ref 78.0–100.0)
Platelets: 163 10*3/uL (ref 150–400)
RBC: 3.37 MIL/uL — AB (ref 3.87–5.11)
RDW: 13.4 % (ref 11.5–15.5)
WBC: 4.8 10*3/uL (ref 4.0–10.5)

## 2014-06-12 LAB — BASIC METABOLIC PANEL
Anion gap: 15 (ref 5–15)
BUN: 25 mg/dL — ABNORMAL HIGH (ref 6–23)
CHLORIDE: 103 meq/L (ref 96–112)
CO2: 24 mEq/L (ref 19–32)
Calcium: 8.9 mg/dL (ref 8.4–10.5)
Creatinine, Ser: 1.06 mg/dL (ref 0.50–1.10)
GFR calc non Af Amer: 47 mL/min — ABNORMAL LOW (ref >90)
GFR, EST AFRICAN AMERICAN: 55 mL/min — AB (ref >90)
Glucose, Bld: 91 mg/dL (ref 70–99)
POTASSIUM: 3.6 meq/L — AB (ref 3.7–5.3)
Sodium: 142 mEq/L (ref 137–147)

## 2014-06-12 LAB — I-STAT TROPONIN, ED: TROPONIN I, POC: 0.01 ng/mL (ref 0.00–0.08)

## 2014-06-12 LAB — PRO B NATRIURETIC PEPTIDE: Pro B Natriuretic peptide (BNP): 341.2 pg/mL (ref 0–450)

## 2014-06-12 MED ORDER — IOHEXOL 350 MG/ML SOLN
80.0000 mL | Freq: Once | INTRAVENOUS | Status: AC | PRN
Start: 2014-06-12 — End: 2014-06-12
  Administered 2014-06-12: 80 mL via INTRAVENOUS

## 2014-06-12 MED ORDER — DIPHENHYDRAMINE HCL 50 MG/ML IJ SOLN
25.0000 mg | Freq: Once | INTRAMUSCULAR | Status: AC
  Administered 2014-06-12: 25 mg via INTRAVENOUS
  Filled 2014-06-12: qty 1

## 2014-06-12 NOTE — ED Notes (Signed)
Ambulated pt from room to nurses desk. Pt O2 remained the same while ambulating. Pt did state that she felt a burning sensation in her throat. Nurse was notified.

## 2014-06-12 NOTE — ED Notes (Signed)
Report received from [place], [place], paperwork needs completed for PTAR transport.  PTAR at bedside now.

## 2014-06-12 NOTE — ED Provider Notes (Signed)
CSN: [id number]     Arrival date & time 06/12/14  0141 History   First MD Initiated Contact with Patient 06/12/14 0500     Chief Complaint  Patient presents with  . Shortness of Breath     (Consider location/radiation/quality/duration/timing/severity/associated sxs/prior Treatment) Patient is a 80 y.o. female presenting with shortness of breath. The history is provided by the patient.  Shortness of Breath She comes in complaining of dyspnea with walking. She is a somewhat poor historian but states that she frequently has difficulty with shortness of breath when she walks. She denies chest pain, heaviness, tightness, pressure. She denies cough. She denies fever or chills. Denies nausea, vomiting, diaphoresis. She denies paroxysmal nocturnal dyspnea. She had recently been admitted to the hospital for similar complaints.  Past Medical History  Diagnosis Date  . Anemia   . Anxiety   . Arthritis   . GERD (gastroesophageal reflux disease) 2009    EGD with benign gastric polyp too  . Hyperlipidemia   . Hypertension   . Vitamin D deficiency   . Colon polyp     2009 colonoscopy, not retrieved for pathology  . Hiatal hernia   . Diverticulosis 2009  . Aortic insufficiency     mild  . Stroke 1975  . CKD (chronic kidney disease) stage 3, GFR 30-59 ml/min   . CHF (congestive heart failure)    Past Surgical History  Procedure Laterality Date  . Appendectomy    . Abdominal hysterectomy    . Artery biopsy Left 12/16/2012    Procedure: BIOPSY TEMPORAL ARTERY;  Surgeon: Mal Misty, MD;  Location: [facility] OR;  Service: Vascular;  Laterality: Left;   Family History  Problem Relation Age of Onset  . Stroke Mother   . Hypertension Mother   . Stroke Father   . Hypertension Father   . Diabetes Sister    History  Substance Use Topics  . Smoking status: Former Smoker    Types: Cigarettes    Quit date: 07/02/1979  . Smokeless tobacco: Former Systems developer  . Alcohol Use: No   OB History    No  data available     Review of Systems  Respiratory: Positive for shortness of breath.   All other systems reviewed and are negative.     Allergies  Review of patient's allergies indicates no known allergies.  Home Medications   Prior to Admission medications   Medication Sig Start Date End Date Taking? Authorizing Provider  acetaminophen (TYLENOL) 325 MG tablet Take 650 mg by mouth every 6 (six) hours as needed for mild pain.   Yes Historical Provider, MD  aspirin EC 81 MG tablet Take 81 mg by mouth daily.   Yes Historical Provider, MD  ferrous sulfate 325 (65 FE) MG tablet take 1 tablet by mouth twice a day 05/10/14  Yes Luan Moore, MD  furosemide (LASIX) 20 MG tablet Take 20 mg by mouth 2 (two) times daily.   Yes Historical Provider, MD  hydroxypropyl methylcellulose / hypromellose (ISOPTO TEARS / GONIOVISC) 2.5 % ophthalmic solution Place 1 drop into the left eye 4 (four) times daily as needed for dry eyes. 04/12/14  Yes Noland Fordyce, PA-C  losartan (COZAAR) 50 MG tablet Take 50 mg by mouth daily.   Yes Historical Provider, MD  metoprolol succinate (TOPROL-XL) 100 MG 24 hr tablet Take 1 tablet (100 mg total) by mouth daily. Take with or immediately following a meal. 06/02/14  Yes Luan Moore, MD  omeprazole (PRILOSEC) 20 MG capsule  Take 1 capsule (20 mg total) by mouth 2 (two) times daily before a meal. 04/12/14  Yes Noland Fordyce, PA-C  potassium chloride SA (K-DUR,KLOR-CON) 20 MEQ tablet Take 20 mEq by mouth daily as needed (cramping).    Yes Historical Provider, MD  sennosides-docusate sodium (SENOKOT-S) 8.6-50 MG tablet Take 1 tablet by mouth at bedtime.    Historical Provider, MD   BP 129/54 mmHg  Pulse 57  Temp(Src) 97.5 F (36.4 C) (Oral)  Resp 13  Ht 5\' 4"  (1.626 m)  Wt 170 lb (77.111 kg)  BMI 29.17 kg/m2  SpO2 100% Physical Exam  Nursing note and vitals reviewed.  80 year old female, resting comfortably and in no acute distress. Vital signs are significant for  bradycardia. Oxygen saturation is 100%, which is normal. Head is normocephalic and atraumatic. PERRLA, EOMI. Oropharynx is clear. Neck is nontender and supple without adenopathy or JVD. Back is nontender and there is no CVA tenderness. Lungs are clear without rales, wheezes, or rhonchi. Chest is nontender. Heart has an irregular rhythm without murmur. Abdomen is soft, flat, nontender without masses or hepatosplenomegaly and peristalsis is normoactive. Extremities have no cyanosis or edema, full range of motion is present. Skin is warm and dry without rash. Neurologic: Mental status is normal, cranial nerves are intact, there are no motor or sensory deficits.  ED Course  Procedures (including critical care time) Labs Review Labs Reviewed  BASIC METABOLIC PANEL - Abnormal; Notable for the following:    Potassium 3.6 (*)    BUN 25 (*)    GFR calc non Af Amer 47 (*)    GFR calc Af Amer 55 (*)    All other components within normal limits  CBC - Abnormal; Notable for the following:    RBC 3.37 (*)    Hemoglobin 10.2 (*)    HCT 31.3 (*)    All other components within normal limits  I-STAT TROPOININ, ED    Imaging Review Dg Chest 2 View  06/12/2014   CLINICAL DATA:  Shortness of breath starting yesterday.  EXAM: CHEST  2 VIEW  COMPARISON:  05/31/2014  FINDINGS: Borderline heart size. Emphysematous changes and fibrosis in the lungs. No focal consolidation. No blunting of costophrenic angles. No pneumothorax. Calcified and tortuous aorta.  IMPRESSION: Emphysematous changes and fibrosis in the lungs. No evidence of active pulmonary disease.   Electronically Signed   By: Lucienne Capers M.D.   On: 06/12/2014 04:58     EKG Interpretation   Date/Time:  Sunday June 12 2014 05:38:19 EST Ventricular Rate:  74 PR Interval:  145 QRS Duration: 83 QT Interval:  408 QTC Calculation: 453 R Axis:   155 Text Interpretation:  Right and left arm electrode reversal,  interpretation assumes no  reversal Sinus or ectopic atrial rhythm Probable  left atrial enlargement Right axis deviation Low voltage, precordial leads  Abnormal R-wave progression, early transition Nonspecific T abnormalities,  lateral leads When compared with ECG of 05/31/2014, arm lead reversal is  now present Confirmed by [facility]  MD, Alyla Pietila ([id number]) on 06/12/2014 5:59:42 AM      EKG Interpretation   Date/Time:  Sunday June 12 2014 05:57:23 EST Ventricular Rate:  69 PR Interval:  137 QRS Duration: 82 QT Interval:  416 QTC Calculation: 446 R Axis:   20 Text Interpretation:  Sinus arrhythmia Probable left atrial enlargement  When compared with ECG of 06/03/2014, Sinus arrhythmia is now Present  Confirmed by [facility]  MD, Raschelle Wisenbaker ([id number]) on 06/12/2014 6:01:43 AM  MDM   Final diagnoses:  SOB (shortness of breath)    Dyspnea of uncertain cause. Chest x-ray shows changes of emphysema and fibrosis but no evidence of bronchospasm on exam. Old records are reviewed and she had been admitted to the hospital recently with no clear cause for dyspnea is established. In the past, she has had CT angiograms for elevated d-dimer which had been negative. She did have an echocardiogram which showed adequate ejection fraction. Given her ongoing complaints of dyspnea, will get CT angiogram of the chest to rule out pulmonary embolism. She is maintaining adequate oxygen saturation at rest. Will try ambulating her to see if she desaturates with exertion.    Delora Fuel, MD 47/09/62 [id number]

## 2014-06-12 NOTE — ED Notes (Signed)
Called patient's RN to inquire about IV access.  Pt does not have an IV, RN stated they would take care of it and call us back.

## 2014-06-12 NOTE — ED Notes (Signed)
PTAR left with patient.  Pt had pocketbook and prescriptions in a bag with her.

## 2014-06-12 NOTE — ED Provider Notes (Signed)
9:48 AM Patient signed out to me by Dr. Roxanne Mins at shift change. Patient coming to the emergency department complaining of acute onset of shortness of breath. Patient evaluated for [facility], lab work and x-rays obtained. Patient is feeding on CT angiogram to rule out pulmonary embolism at this time.  Results for orders placed or performed during the hospital encounter of 15/72/62  Basic metabolic panel    (if pt has PMH of COPD)  Result Value Ref Range   Sodium 142 137 - 147 mEq/L   Potassium 3.6 (L) 3.7 - 5.3 mEq/L   Chloride 103 96 - 112 mEq/L   CO2 24 19 - 32 mEq/L   Glucose, Bld 91 70 - 99 mg/dL   BUN 25 (H) 6 - 23 mg/dL   Creatinine, Ser 1.06 0.50 - 1.10 mg/dL   Calcium 8.9 8.4 - 10.5 mg/dL   GFR calc non Af Amer 47 (L) >90 mL/min   GFR calc Af Amer 55 (L) >90 mL/min   Anion gap 15 5 - 15  CBC     (if pt has PMH of COPD)  Result Value Ref Range   WBC 4.8 4.0 - 10.5 K/uL   RBC 3.37 (L) 3.87 - 5.11 MIL/uL   Hemoglobin 10.2 (L) 12.0 - 15.0 g/dL   HCT 31.3 (L) 36.0 - 46.0 %   MCV 92.9 78.0 - 100.0 fL   MCH 30.3 26.0 - 34.0 pg   MCHC 32.6 30.0 - 36.0 g/dL   RDW 13.4 11.5 - 15.5 %   Platelets 163 150 - 400 K/uL  Pro b natriuretic peptide (BNP)  Result Value Ref Range   Pro B Natriuretic peptide (BNP) 341.2 0 - 450 pg/mL  I-stat troponin, ED (if patient has history of COPD)  Result Value Ref Range   Troponin i, poc 0.01 0.00 - 0.08 ng/mL   Comment 3           Dg Chest 2 View  06/12/2014   CLINICAL DATA:  Shortness of breath starting yesterday.  EXAM: CHEST  2 VIEW  COMPARISON:  05/31/2014  FINDINGS: Borderline heart size. Emphysematous changes and fibrosis in the lungs. No focal consolidation. No blunting of costophrenic angles. No pneumothorax. Calcified and tortuous aorta.  IMPRESSION: Emphysematous changes and fibrosis in the lungs. No evidence of active pulmonary disease.   Electronically Signed   By: Lucienne Capers M.D.   On: 06/12/2014 04:58   Dg Chest 2 View  05/31/2014    CLINICAL DATA:  Dyspnea  EXAM: CHEST  2 VIEW  COMPARISON:  April 20, 2014  FINDINGS: There is underlying emphysematous change. There is no edema or consolidation. Heart is mildly enlarged with pulmonary vascularity within normal limits. No adenopathy. Bones are osteoporotic. There is anterior wedging of an upper thoracic vertebral body.  IMPRESSION: Underlying emphysema. No edema or consolidation. Stable cardiac enlargement.   Electronically Signed   By: Lowella Grip M.D.   On: 05/31/2014 11:44   Ct Angio Chest Pe W/cm &/or Wo Cm  06/12/2014   CLINICAL DATA:  80 year old female with shortness of breath  EXAM: CT ANGIOGRAPHY CHEST WITH CONTRAST  TECHNIQUE: Multidetector CT imaging of the chest was performed using the standard protocol during bolus administration of intravenous contrast. Multiplanar CT image reconstructions and MIPs were obtained to evaluate the vascular anatomy.  CONTRAST:  72mL OMNIPAQUE IOHEXOL 350 MG/ML SOLN  COMPARISON:  Prior chest x-ray obtained earlier today; most recent prior chest CT 04/01/2014  FINDINGS: Mediastinum: Unremarkable CT appearance  of the thyroid gland. No suspicious mediastinal or hilar adenopathy. Calcified right hilar adenopathy. No soft tissue mediastinal mass. The thoracic esophagus is unremarkable.  Heart/Vascular: Adequate opacification of the pulmonary arteries to the proximal subsegmental level. No central filling defect to suggest acute pulmonary embolus. Scattered atherosclerotic calcifications throughout the coronary arteries and aorta. No aneurysmal dilatation. The heart is likely within normal limits for size but is somewhat elongated giving the appearance of cardiomegaly. No pericardial effusion.  Lungs/Pleura: No pulmonary of the scratch then no pleural effusion. Mild dependent atelectasis in the lower lobes. Otherwise, the lungs are clear. Diffuse mild bronchial wall thickening in the lower lobes.  Bones/Soft Tissues: No acute fracture or aggressive  appearing lytic or blastic osseous lesion.  Upper Abdomen: No acute abnormality in the visualized upper abdomen. Stable T3 and T4 superior endplate compression fractures. There is no significant height loss at T3 and approximately 40% height loss at T4.  Review of the MIP images confirms the above findings.  IMPRESSION: 1. Negative for acute pulmonary embolus, pneumonia or other acute cardiopulmonary process. 2. Stable T3 and T4 compression fractures.   Electronically Signed   By: Jacqulynn Cadet M.D.   On: 06/12/2014 09:28     CT is back showing no evidence of acute process. Patient's vital signs are normal. She is in no distress. Her lungs are clear and evaluation. This time patient is stable for discharge home with outpatient follow-up. Return precautions discussed.  Filed Vitals:   06/12/14 0630 06/12/14 0700 06/12/14 0800 06/12/14 0830  BP: 140/58 132/33 132/52 141/45  Pulse: 72 66 73 72  Temp:      TempSrc:      Resp: 19 17 20 18   Height:      Weight:      SpO2: 98% 99% 100% 100%     Renold Genta, PA-C 17/61/60 [id number]  David Glick, MD 12/24/92 [id number]

## 2014-06-12 NOTE — ED Notes (Signed)
Pt arrives with c/o [facility] intermittently with exertion. None at this time, "I can't really tell when I'm lying down." Heartburn with laying down, known GERD hx.

## 2014-06-12 NOTE — Discharge Instructions (Signed)
Your work up today did not show any emergent findings. Please follow up with your primary care doctor as soon as able for re evaluation and further treatment. Return if your symptoms are worsening.   Shortness of Breath Shortness of breath means you have trouble breathing. It could also mean that you have a medical problem. You should get immediate medical care for shortness of breath. CAUSES   Not enough oxygen in the air such as with high altitudes or a smoke-filled room.  Certain lung diseases, infections, or problems.  Heart disease or conditions, such as angina or heart failure.  Low red blood cells (anemia).  Poor physical fitness, which can cause shortness of breath when you exercise.  Chest or back injuries or stiffness.  Being overweight.  Smoking.  Anxiety, which can make you feel like you are not getting enough air. DIAGNOSIS  Serious medical problems can often be found during your physical exam. Tests may also be done to determine why you are having shortness of breath. Tests may include:  Chest X-rays.  Lung function tests.  Blood tests.  An electrocardiogram (ECG).  An ambulatory electrocardiogram. An ambulatory ECG records your heartbeat patterns over a 24-hour period.  Exercise testing.  A transthoracic echocardiogram (TTE). During echocardiography, sound waves are used to evaluate how blood flows through your heart.  A transesophageal echocardiogram (TEE).  Imaging scans. Your health care provider may not be able to find a cause for your shortness of breath after your exam. In this case, it is important to have a follow-up exam with your health care provider as directed.  TREATMENT  Treatment for shortness of breath depends on the cause of your symptoms and can vary greatly. HOME CARE INSTRUCTIONS   Do not smoke. Smoking is a common cause of shortness of breath. If you smoke, ask for help to quit.  Avoid being around chemicals or things that may  bother your breathing, such as paint fumes and dust.  Rest as needed. Slowly resume your usual activities.  If medicines were prescribed, take them as directed for the full length of time directed. This includes oxygen and any inhaled medicines.  Keep all follow-up appointments as directed by your health care provider. SEEK MEDICAL CARE IF:   Your condition does not improve in the time expected.  You have a hard time doing your normal activities even with rest.  You have any new symptoms. SEEK IMMEDIATE MEDICAL CARE IF:   Your shortness of breath gets worse.  You feel light-headed, faint, or develop a cough not controlled with medicines.  You start coughing up blood.  You have pain with breathing.  You have chest pain or pain in your arms, shoulders, or abdomen.  You have a fever.  You are unable to walk up stairs or exercise the way you normally do. MAKE SURE YOU:  Understand these instructions.  Will watch your condition.  Will get help right away if you are not doing well or get worse. Document Released: 03/12/2001 Document Revised: 06/22/2013 Document Reviewed: 09/02/2011 [facility] Patient Information 2015 [employer], [place]. This information is not intended to replace advice given to you by your health care provider. Make sure you discuss any questions you have with your health care provider.

## 2014-06-12 NOTE — ED Notes (Signed)
Patient transported to X-ray 

## 2014-06-12 NOTE — ED Notes (Signed)
Notified [facility] for transportation back home 

## 2014-06-14 ENCOUNTER — Ambulatory Visit: Payer: Medicare Other | Admitting: Internal Medicine

## 2014-06-15 DIAGNOSIS — F0390 Unspecified dementia without behavioral disturbance: Secondary | ICD-10-CM | POA: Diagnosis not present

## 2014-06-15 DIAGNOSIS — N183 Chronic kidney disease, stage 3 (moderate): Secondary | ICD-10-CM | POA: Diagnosis not present

## 2014-06-15 DIAGNOSIS — I129 Hypertensive chronic kidney disease with stage 1 through stage 4 chronic kidney disease, or unspecified chronic kidney disease: Secondary | ICD-10-CM | POA: Diagnosis not present

## 2014-06-15 DIAGNOSIS — F419 Anxiety disorder, unspecified: Secondary | ICD-10-CM | POA: Diagnosis not present

## 2014-06-15 DIAGNOSIS — I509 Heart failure, unspecified: Secondary | ICD-10-CM | POA: Diagnosis not present

## 2014-06-15 DIAGNOSIS — R5381 Other malaise: Secondary | ICD-10-CM | POA: Diagnosis not present

## 2014-06-17 DIAGNOSIS — R5381 Other malaise: Secondary | ICD-10-CM | POA: Diagnosis not present

## 2014-06-17 DIAGNOSIS — I509 Heart failure, unspecified: Secondary | ICD-10-CM | POA: Diagnosis not present

## 2014-06-17 DIAGNOSIS — N183 Chronic kidney disease, stage 3 (moderate): Secondary | ICD-10-CM | POA: Diagnosis not present

## 2014-06-17 DIAGNOSIS — F419 Anxiety disorder, unspecified: Secondary | ICD-10-CM | POA: Diagnosis not present

## 2014-06-17 DIAGNOSIS — F0390 Unspecified dementia without behavioral disturbance: Secondary | ICD-10-CM | POA: Diagnosis not present

## 2014-06-17 DIAGNOSIS — I129 Hypertensive chronic kidney disease with stage 1 through stage 4 chronic kidney disease, or unspecified chronic kidney disease: Secondary | ICD-10-CM | POA: Diagnosis not present

## 2014-06-25 ENCOUNTER — Emergency Department (HOSPITAL_COMMUNITY): Payer: Medicare Other

## 2014-06-25 ENCOUNTER — Emergency Department (HOSPITAL_COMMUNITY)
Admission: EM | Admit: 2014-06-25 | Discharge: 2014-06-25 | Disposition: A | Discharge status: Home / Self Care | Payer: Medicare Other | Attending: Emergency Medicine | Admitting: Emergency Medicine

## 2014-06-25 ENCOUNTER — Encounter (HOSPITAL_COMMUNITY): Payer: Self-pay | Admitting: Emergency Medicine

## 2014-06-25 DIAGNOSIS — I509 Heart failure, unspecified: Secondary | ICD-10-CM | POA: Insufficient documentation

## 2014-06-25 DIAGNOSIS — D649 Anemia, unspecified: Secondary | ICD-10-CM | POA: Diagnosis not present

## 2014-06-25 DIAGNOSIS — Z8673 Personal history of transient ischemic attack (TIA), and cerebral infarction without residual deficits: Secondary | ICD-10-CM | POA: Insufficient documentation

## 2014-06-25 DIAGNOSIS — Z87891 Personal history of nicotine dependence: Secondary | ICD-10-CM | POA: Insufficient documentation

## 2014-06-25 DIAGNOSIS — R0602 Shortness of breath: Secondary | ICD-10-CM | POA: Diagnosis not present

## 2014-06-25 DIAGNOSIS — I129 Hypertensive chronic kidney disease with stage 1 through stage 4 chronic kidney disease, or unspecified chronic kidney disease: Secondary | ICD-10-CM | POA: Diagnosis not present

## 2014-06-25 DIAGNOSIS — J80 Acute respiratory distress syndrome: Secondary | ICD-10-CM | POA: Diagnosis not present

## 2014-06-25 DIAGNOSIS — K219 Gastro-esophageal reflux disease without esophagitis: Secondary | ICD-10-CM | POA: Diagnosis not present

## 2014-06-25 DIAGNOSIS — N183 Chronic kidney disease, stage 3 (moderate): Secondary | ICD-10-CM | POA: Diagnosis not present

## 2014-06-25 DIAGNOSIS — Z8639 Personal history of other endocrine, nutritional and metabolic disease: Secondary | ICD-10-CM | POA: Diagnosis not present

## 2014-06-25 DIAGNOSIS — M1990 Unspecified osteoarthritis, unspecified site: Secondary | ICD-10-CM | POA: Diagnosis not present

## 2014-06-25 DIAGNOSIS — Z79899 Other long term (current) drug therapy: Secondary | ICD-10-CM | POA: Diagnosis not present

## 2014-06-25 DIAGNOSIS — Z86010 Personal history of colonic polyps: Secondary | ICD-10-CM | POA: Diagnosis not present

## 2014-06-25 DIAGNOSIS — Z8659 Personal history of other mental and behavioral disorders: Secondary | ICD-10-CM | POA: Insufficient documentation

## 2014-06-25 DIAGNOSIS — Z7982 Long term (current) use of aspirin: Secondary | ICD-10-CM | POA: Diagnosis not present

## 2014-06-25 DIAGNOSIS — I517 Cardiomegaly: Secondary | ICD-10-CM | POA: Diagnosis not present

## 2014-06-25 DIAGNOSIS — R069 Unspecified abnormalities of breathing: Secondary | ICD-10-CM | POA: Diagnosis not present

## 2014-06-25 LAB — BASIC METABOLIC PANEL
Anion gap: 8 (ref 5–15)
BUN: 25 mg/dL — ABNORMAL HIGH (ref 6–23)
CO2: 23 mmol/L (ref 19–32)
CREATININE: 1.13 mg/dL — AB (ref 0.50–1.10)
Calcium: 9.1 mg/dL (ref 8.4–10.5)
Chloride: 109 mEq/L (ref 96–112)
GFR, EST AFRICAN AMERICAN: 51 mL/min — AB (ref >90)
GFR, EST NON AFRICAN AMERICAN: 44 mL/min — AB (ref >90)
Glucose, Bld: 90 mg/dL (ref 70–99)
Potassium: 3.4 mmol/L — ABNORMAL LOW (ref 3.5–5.1)
Sodium: 140 mmol/L (ref 135–145)

## 2014-06-25 LAB — BLOOD GAS, VENOUS
Acid-Base Excess: 0.6 mmol/L (ref 0.0–2.0)
BICARBONATE: 25.4 meq/L — AB (ref 20.0–24.0)
Drawn by: 229371
FIO2: 0.21 %
O2 Saturation: 38.6 %
Patient temperature: 98.6
TCO2: 23 mmol/L (ref 0–100)
pCO2, Ven: 43.5 mmHg — ABNORMAL LOW (ref 45.0–50.0)
pH, Ven: 7.384 — ABNORMAL HIGH (ref 7.250–7.300)

## 2014-06-25 LAB — CBC WITH DIFFERENTIAL/PLATELET
BASOS PCT: 0 % (ref 0–1)
Basophils Absolute: 0 10*3/uL (ref 0.0–0.1)
EOS ABS: 0 10*3/uL (ref 0.0–0.7)
EOS PCT: 0 % (ref 0–5)
HEMATOCRIT: 31.5 % — AB (ref 36.0–46.0)
HEMOGLOBIN: 10.3 g/dL — AB (ref 12.0–15.0)
LYMPHS ABS: 1.7 10*3/uL (ref 0.7–4.0)
Lymphocytes Relative: 39 % (ref 12–46)
MCH: 31 pg (ref 26.0–34.0)
MCHC: 32.7 g/dL (ref 30.0–36.0)
MCV: 94.9 fL (ref 78.0–100.0)
MONO ABS: 0.3 10*3/uL (ref 0.1–1.0)
Monocytes Relative: 7 % (ref 3–12)
Neutro Abs: 2.4 10*3/uL (ref 1.7–7.7)
Neutrophils Relative %: 54 % (ref 43–77)
Platelets: 162 10*3/uL (ref 150–400)
RBC: 3.32 MIL/uL — AB (ref 3.87–5.11)
RDW: 13.2 % (ref 11.5–15.5)
WBC: 4.4 10*3/uL (ref 4.0–10.5)

## 2014-06-25 LAB — BRAIN NATRIURETIC PEPTIDE: B Natriuretic Peptide: 55.7 pg/mL (ref 0.0–100.0)

## 2014-06-25 LAB — I-STAT TROPONIN, ED
TROPONIN I, POC: 0.01 ng/mL (ref 0.00–0.08)
Troponin i, poc: 0 ng/mL (ref 0.00–0.08)

## 2014-06-25 NOTE — ED Provider Notes (Signed)
CSN: [id number]     Arrival date & time 06/25/14  0054 History   First MD Initiated Contact with Patient 06/25/14 0154     Chief Complaint  Patient presents with  . Shortness of Breath     (Consider location/radiation/quality/duration/timing/severity/associated sxs/prior Treatment) HPI  Jane [name] is a 80 y.o. female with past medical history of hypertension, hyperlipidemia, CHF coming in with shortness of breath. She states she was sitting at home when this occurred all of a sudden around 8 PM after taking her nighttime medication. She denies any chest pain, diaphoresis, or emesis with this. She has had multiple workups for this in the past and tells me she was recently at [facility].  There are then obtained a CT angiogram was negative. She denies any worsening pain or swelling in her legs. She said no recent long periods of immobilization.    10 Systems reviewed and are negative for acute change except as noted in the HPI.    Past Medical History  Diagnosis Date  . Anemia   . Anxiety   . Arthritis   . GERD (gastroesophageal reflux disease) 2009    EGD with benign gastric polyp too  . Hyperlipidemia   . Hypertension   . Vitamin D deficiency   . Colon polyp     2009 colonoscopy, not retrieved for pathology  . Hiatal hernia   . Diverticulosis 2009  . Aortic insufficiency     mild  . Stroke 1975  . CKD (chronic kidney disease) stage 3, GFR 30-59 ml/min   . CHF (congestive heart failure)    Past Surgical History  Procedure Laterality Date  . Appendectomy    . Abdominal hysterectomy    . Artery biopsy Left 12/16/2012    Procedure: BIOPSY TEMPORAL ARTERY;  Surgeon: Mal Misty, MD;  Location: [facility] OR;  Service: Vascular;  Laterality: Left;   Family History  Problem Relation Age of Onset  . Stroke Mother   . Hypertension Mother   . Stroke Father   . Hypertension Father   . Diabetes Sister    History  Substance Use Topics  . Smoking status: Former Smoker   Types: Cigarettes    Quit date: 07/02/1979  . Smokeless tobacco: Former Systems developer  . Alcohol Use: No   OB History    No data available     Review of Systems    Allergies  Review of patient's allergies indicates no known allergies.  Home Medications   Prior to Admission medications   Medication Sig Start Date End Date Taking? Authorizing Provider  acetaminophen (TYLENOL) 325 MG tablet Take 650 mg by mouth every 6 (six) hours as needed for mild pain.   Yes Historical Provider, MD  aspirin EC 81 MG tablet Take 81 mg by mouth daily.   Yes Historical Provider, MD  ferrous sulfate 325 (65 FE) MG tablet take 1 tablet by mouth twice a day 05/10/14  Yes Luan Moore, MD  furosemide (LASIX) 20 MG tablet Take 20 mg by mouth 2 (two) times daily.   Yes Historical Provider, MD  hydroxypropyl methylcellulose / hypromellose (ISOPTO TEARS / GONIOVISC) 2.5 % ophthalmic solution Place 1 drop into the left eye 4 (four) times daily as needed for dry eyes. 04/12/14  Yes Noland Fordyce, PA-C  losartan (COZAAR) 50 MG tablet Take 50 mg by mouth daily.   Yes Historical Provider, MD  metoprolol succinate (TOPROL-XL) 100 MG 24 hr tablet Take 1 tablet (100 mg total) by mouth daily.  Take with or immediately following a meal. 06/02/14  Yes Luan Moore, MD  omeprazole (PRILOSEC) 20 MG capsule Take 1 capsule (20 mg total) by mouth 2 (two) times daily before a meal. 04/12/14  Yes Noland Fordyce, PA-C  potassium chloride SA (K-DUR,KLOR-CON) 20 MEQ tablet Take 20 mEq by mouth daily as needed (cramping).    Yes Historical Provider, MD  sennosides-docusate sodium (SENOKOT-S) 8.6-50 MG tablet Take 1 tablet by mouth at bedtime.   Yes Historical Provider, MD   BP 152/58 mmHg  Pulse 85  Temp(Src) 98.1 F (36.7 C) (Oral)  Resp 20  SpO2 99% Physical Exam  Constitutional: She is oriented to person, place, and time. She appears well-developed and well-nourished. No distress.  HENT:  Head: Normocephalic and atraumatic.  Nose:  Nose normal.  Mouth/Throat: Oropharynx is clear and moist. No oropharyngeal exudate.  Eyes: Conjunctivae and EOM are normal. Pupils are equal, round, and reactive to light. No scleral icterus.  Neck: Normal range of motion. Neck supple. No JVD present. No tracheal deviation present. No thyromegaly present.  Cardiovascular: Normal rate, regular rhythm and normal heart sounds.  Exam reveals no gallop and no friction rub.   No murmur heard. Pulmonary/Chest: Effort normal and breath sounds normal. No respiratory distress. She has no wheezes. She exhibits no tenderness.  Abdominal: Soft. Bowel sounds are normal. She exhibits no distension and no mass. There is no tenderness. There is no rebound and no guarding.  Musculoskeletal: Normal range of motion. She exhibits no edema or tenderness.  Lymphadenopathy:    She has no cervical adenopathy.  Neurological: She is alert and oriented to person, place, and time. No cranial nerve deficit. She exhibits normal muscle tone.  Skin: Skin is warm and dry. No rash noted. She is not diaphoretic. No erythema. No pallor.  Nursing note and vitals reviewed.   ED Course  Procedures (including critical care time) Labs Review Labs Reviewed  CBC WITH DIFFERENTIAL - Abnormal; Notable for the following:    RBC 3.32 (*)    Hemoglobin 10.3 (*)    HCT 31.5 (*)    All other components within normal limits  BASIC METABOLIC PANEL - Abnormal; Notable for the following:    Potassium 3.4 (*)    BUN 25 (*)    Creatinine, Ser 1.13 (*)    GFR calc non Af Amer 44 (*)    GFR calc Af Amer 51 (*)    All other components within normal limits  BLOOD GAS, VENOUS - Abnormal; Notable for the following:    pH, Ven 7.384 (*)    pCO2, Ven 43.5 (*)    Bicarbonate 25.4 (*)    All other components within normal limits  BRAIN NATRIURETIC PEPTIDE  I-STAT TROPOININ, ED  I-STAT TROPOININ, ED    Imaging Review Dg Chest 2 View  06/25/2014   CLINICAL DATA:  Short of breath,  congestive heart failure  EXAM: CHEST  2 VIEW  COMPARISON:  Chest radiograph 06/12/2014  FINDINGS: Stable enlarged cardiac silhouette. No effusion, infiltrate, pneumothorax. Chronic bronchitic markings.  IMPRESSION: Cardiomegaly and chronic bronchitic markings.  No acute findings.   Electronically Signed   By: Suzy Bouchard M.D.   On: 06/25/2014 02:26     EKG Interpretation   Date/Time:  Saturday June 25 2014 02:16:37 EST Ventricular Rate:  83 PR Interval:  157 QRS Duration: 78 QT Interval:  380 QTC Calculation: 446 R Axis:     Text Interpretation:  Sinus rhythm Atrial premature complex Abnormal  R-wave progression, early transition Inferior infarct, old No significant  change since last tracing Confirmed by Glynn Octave [phone]) on  06/25/2014 2:24:09 AM      MDM   Final diagnoses:  SOB (shortness of breath)    Patient since emergency department out of concern for shortness of breath. She's had multiple workups in the past which have all been negative for dyspnea. Will evaluate patient with heart score in the emergency department.  EKG is unchanged.  Workup in the emergency department is negative. Patient has 2 sets of troponins are negative, EKG is unchanged, chest x-ray is negative. By heart score patient is a 3 due to age and hypertension. At this time her vital signs remain within her normal limits and she is safe for discharge. She is advised to follow-up with her primary physician regarding her dyspnea. She has been seen multiple times in this emergency department with negative workups for dyspnea.    Everlene Balls, MD 06/25/14 [id number]

## 2014-06-25 NOTE — Discharge Instructions (Signed)
Shortness of Breath Jane [name], you were seen today for shortness of breath.  Your labs, chest xray, and EKG were at your normal baseline.  Please follow up with your primary physician within 3 days for continued management.  If symptoms worsen, return to the ED immediately.  Thank you. Shortness of breath means you have trouble breathing. Shortness of breath needs medical care right away. HOME CARE   Do not smoke.  Avoid being around chemicals or things (paint fumes, dust) that may bother your breathing.  Rest as needed. Slowly begin your normal activities.  Only take medicines as told by your doctor.  Keep all doctor visits as told. GET HELP RIGHT AWAY IF:   Your shortness of breath gets worse.  You feel lightheaded, pass out (faint), or have a cough that is not helped by medicine.  You cough up blood.  You have pain with breathing.  You have pain in your chest, arms, shoulders, or belly (abdomen).  You have a fever.  You cannot walk up stairs or exercise the way you normally do.  You do not get better in the time expected.  You have a hard time doing normal activities even with rest.  You have problems with your medicines.  You have any new symptoms. MAKE SURE YOU:  Understand these instructions.  Will watch your condition.  Will get help right away if you are not doing well or get worse. Document Released: 12/04/2007 Document Revised: 06/22/2013 Document Reviewed: 09/02/2011 [facility] Patient Information 2015 [place], [place]. This information is not intended to replace advice given to you by your health care provider. Make sure you discuss any questions you have with your health care provider.

## 2014-06-25 NOTE — ED Notes (Signed)
[facility] contacted for pt transport. Pt states she lives alone is able to ambulate around her home by herself.

## 2014-06-25 NOTE — ED Notes (Signed)
Per EMS pt comes from home. Pt c/o SOB. Pt is currently in no distress and lung sounds are clear on ausculation. Pt is alert and oriented.

## 2014-06-25 NOTE — ED Notes (Signed)
Bed: ZL93 Expected date:  Expected time:  Means of arrival:  Comments: EMS [facility] 68F

## 2014-07-04 ENCOUNTER — Ambulatory Visit: Payer: Medicare Other | Admitting: Internal Medicine

## 2014-07-06 DIAGNOSIS — I509 Heart failure, unspecified: Secondary | ICD-10-CM | POA: Diagnosis not present

## 2014-07-06 DIAGNOSIS — N183 Chronic kidney disease, stage 3 (moderate): Secondary | ICD-10-CM | POA: Diagnosis not present

## 2014-07-06 DIAGNOSIS — F419 Anxiety disorder, unspecified: Secondary | ICD-10-CM | POA: Diagnosis not present

## 2014-07-06 DIAGNOSIS — R5381 Other malaise: Secondary | ICD-10-CM | POA: Diagnosis not present

## 2014-07-06 DIAGNOSIS — I129 Hypertensive chronic kidney disease with stage 1 through stage 4 chronic kidney disease, or unspecified chronic kidney disease: Secondary | ICD-10-CM | POA: Diagnosis not present

## 2014-07-06 DIAGNOSIS — F0390 Unspecified dementia without behavioral disturbance: Secondary | ICD-10-CM | POA: Diagnosis not present

## 2014-07-17 ENCOUNTER — Emergency Department (HOSPITAL_COMMUNITY): Payer: Medicare Other

## 2014-07-17 ENCOUNTER — Emergency Department (HOSPITAL_COMMUNITY)
Admission: EM | Admit: 2014-07-17 | Discharge: 2014-07-17 | Disposition: A | Discharge status: Home / Self Care | Payer: Medicare Other | Attending: Emergency Medicine | Admitting: Emergency Medicine

## 2014-07-17 ENCOUNTER — Encounter (HOSPITAL_COMMUNITY): Payer: Self-pay | Admitting: General Practice

## 2014-07-17 DIAGNOSIS — J80 Acute respiratory distress syndrome: Secondary | ICD-10-CM | POA: Diagnosis not present

## 2014-07-17 DIAGNOSIS — Z8673 Personal history of transient ischemic attack (TIA), and cerebral infarction without residual deficits: Secondary | ICD-10-CM | POA: Insufficient documentation

## 2014-07-17 DIAGNOSIS — Z7982 Long term (current) use of aspirin: Secondary | ICD-10-CM | POA: Insufficient documentation

## 2014-07-17 DIAGNOSIS — K219 Gastro-esophageal reflux disease without esophagitis: Secondary | ICD-10-CM | POA: Diagnosis not present

## 2014-07-17 DIAGNOSIS — Z86010 Personal history of colonic polyps: Secondary | ICD-10-CM | POA: Insufficient documentation

## 2014-07-17 DIAGNOSIS — Z8639 Personal history of other endocrine, nutritional and metabolic disease: Secondary | ICD-10-CM | POA: Insufficient documentation

## 2014-07-17 DIAGNOSIS — I129 Hypertensive chronic kidney disease with stage 1 through stage 4 chronic kidney disease, or unspecified chronic kidney disease: Secondary | ICD-10-CM | POA: Insufficient documentation

## 2014-07-17 DIAGNOSIS — M1990 Unspecified osteoarthritis, unspecified site: Secondary | ICD-10-CM | POA: Diagnosis not present

## 2014-07-17 DIAGNOSIS — J449 Chronic obstructive pulmonary disease, unspecified: Secondary | ICD-10-CM | POA: Diagnosis not present

## 2014-07-17 DIAGNOSIS — Z8659 Personal history of other mental and behavioral disorders: Secondary | ICD-10-CM | POA: Insufficient documentation

## 2014-07-17 DIAGNOSIS — D649 Anemia, unspecified: Secondary | ICD-10-CM | POA: Insufficient documentation

## 2014-07-17 DIAGNOSIS — N183 Chronic kidney disease, stage 3 (moderate): Secondary | ICD-10-CM | POA: Insufficient documentation

## 2014-07-17 DIAGNOSIS — Z87891 Personal history of nicotine dependence: Secondary | ICD-10-CM | POA: Diagnosis not present

## 2014-07-17 DIAGNOSIS — R0602 Shortness of breath: Secondary | ICD-10-CM | POA: Diagnosis not present

## 2014-07-17 DIAGNOSIS — I509 Heart failure, unspecified: Secondary | ICD-10-CM | POA: Insufficient documentation

## 2014-07-17 DIAGNOSIS — J811 Chronic pulmonary edema: Secondary | ICD-10-CM | POA: Diagnosis not present

## 2014-07-17 DIAGNOSIS — Z79899 Other long term (current) drug therapy: Secondary | ICD-10-CM | POA: Diagnosis not present

## 2014-07-17 DIAGNOSIS — I517 Cardiomegaly: Secondary | ICD-10-CM | POA: Diagnosis not present

## 2014-07-17 LAB — BASIC METABOLIC PANEL
Anion gap: 6 (ref 5–15)
BUN: 25 mg/dL — ABNORMAL HIGH (ref 6–23)
CALCIUM: 9.1 mg/dL (ref 8.4–10.5)
CO2: 27 mmol/L (ref 19–32)
CREATININE: 1.02 mg/dL (ref 0.50–1.10)
Chloride: 109 mEq/L (ref 96–112)
GFR calc Af Amer: 57 mL/min — ABNORMAL LOW (ref >90)
GFR, EST NON AFRICAN AMERICAN: 49 mL/min — AB (ref >90)
GLUCOSE: 91 mg/dL (ref 70–99)
POTASSIUM: 3.9 mmol/L (ref 3.5–5.1)
Sodium: 142 mmol/L (ref 135–145)

## 2014-07-17 LAB — TROPONIN I: Troponin I: <0.03 ng/mL (ref <0.031)

## 2014-07-17 LAB — CBC WITH DIFFERENTIAL/PLATELET
Basophils Absolute: 0 10*3/uL (ref 0.0–0.1)
Basophils Relative: 0 % (ref 0–1)
EOS ABS: 0 10*3/uL (ref 0.0–0.7)
Eosinophils Relative: 0 % (ref 0–5)
HCT: 31 % — ABNORMAL LOW (ref 36.0–46.0)
HEMOGLOBIN: 10.3 g/dL — AB (ref 12.0–15.0)
Lymphocytes Relative: 32 % (ref 12–46)
Lymphs Abs: 1.2 10*3/uL (ref 0.7–4.0)
MCH: 31 pg (ref 26.0–34.0)
MCHC: 33.2 g/dL (ref 30.0–36.0)
MCV: 93.4 fL (ref 78.0–100.0)
Monocytes Absolute: 0.3 10*3/uL (ref 0.1–1.0)
Monocytes Relative: 7 % (ref 3–12)
Neutro Abs: 2.3 10*3/uL (ref 1.7–7.7)
Neutrophils Relative %: 61 % (ref 43–77)
PLATELETS: 144 10*3/uL — AB (ref 150–400)
RBC: 3.32 MIL/uL — ABNORMAL LOW (ref 3.87–5.11)
RDW: 13.7 % (ref 11.5–15.5)
WBC: 3.7 10*3/uL — ABNORMAL LOW (ref 4.0–10.5)

## 2014-07-17 LAB — BRAIN NATRIURETIC PEPTIDE: B NATRIURETIC PEPTIDE 5: 62.9 pg/mL (ref 0.0–100.0)

## 2014-07-17 MED ORDER — ASPIRIN 81 MG PO CHEW
324.0000 mg | CHEWABLE_TABLET | Freq: Once | ORAL | Status: AC
  Administered 2014-07-17: 324 mg via ORAL
  Filled 2014-07-17: qty 4

## 2014-07-17 NOTE — ED Provider Notes (Signed)
CSN: [id number]     Arrival date & time 07/17/14  3474 History   First MD Initiated Contact with Patient 07/17/14 [phone]     Chief Complaint  Patient presents with  . Shortness of Breath     (Consider location/radiation/quality/duration/timing/severity/associated sxs/prior Treatment) Patient is a 81 y.o. female presenting with shortness of breath.  Shortness of Breath Severity:  Moderate Onset quality:  Sudden Duration: unable to clearly specify. Timing:  Constant Progression:  Resolved Chronicity:  Recurrent Context comment:  Pt reports having to rush to the bathroom, moving faster than she normally does. Relieved by:  Rest Worsened by:  Exertion Associated symptoms: no chest pain, no cough, no diaphoresis and no fever     Past Medical History  Diagnosis Date  . Anemia   . Anxiety   . Arthritis   . GERD (gastroesophageal reflux disease) 2009    EGD with benign gastric polyp too  . Hyperlipidemia   . Hypertension   . Vitamin D deficiency   . Colon polyp     2009 colonoscopy, not retrieved for pathology  . Hiatal hernia   . Diverticulosis 2009  . Aortic insufficiency     mild  . Stroke 1975  . CKD (chronic kidney disease) stage 3, GFR 30-59 ml/min   . CHF (congestive heart failure)    Past Surgical History  Procedure Laterality Date  . Appendectomy    . Abdominal hysterectomy    . Artery biopsy Left 12/16/2012    Procedure: BIOPSY TEMPORAL ARTERY;  Surgeon: Mal Misty, MD;  Location: [facility] OR;  Service: Vascular;  Laterality: Left;   Family History  Problem Relation Age of Onset  . Stroke Mother   . Hypertension Mother   . Stroke Father   . Hypertension Father   . Diabetes Sister    History  Substance Use Topics  . Smoking status: Former Smoker    Types: Cigarettes    Quit date: 07/02/1979  . Smokeless tobacco: Former Systems developer  . Alcohol Use: No   OB History    No data available     Review of Systems  Constitutional: Negative for fever and diaphoresis.   Respiratory: Positive for shortness of breath. Negative for cough.   Cardiovascular: Negative for chest pain.  All other systems reviewed and are negative.     Allergies  Review of patient's allergies indicates no known allergies.  Home Medications   Prior to Admission medications   Medication Sig Start Date End Date Taking? Authorizing Provider  acetaminophen (TYLENOL) 325 MG tablet Take 650 mg by mouth every 6 (six) hours as needed for mild pain.    Historical Provider, MD  aspirin EC 81 MG tablet Take 81 mg by mouth daily.    Historical Provider, MD  ferrous sulfate 325 (65 FE) MG tablet take 1 tablet by mouth twice a day 05/10/14   Luan Moore, MD  furosemide (LASIX) 20 MG tablet Take 20 mg by mouth 2 (two) times daily.    Historical Provider, MD  hydroxypropyl methylcellulose / hypromellose (ISOPTO TEARS / GONIOVISC) 2.5 % ophthalmic solution Place 1 drop into the left eye 4 (four) times daily as needed for dry eyes. 04/12/14   Noland Fordyce, PA-C  losartan (COZAAR) 50 MG tablet Take 50 mg by mouth daily.    Historical Provider, MD  metoprolol succinate (TOPROL-XL) 100 MG 24 hr tablet Take 1 tablet (100 mg total) by mouth daily. Take with or immediately following a meal. 06/02/14   Luan Moore,  MD  omeprazole (PRILOSEC) 20 MG capsule Take 1 capsule (20 mg total) by mouth 2 (two) times daily before a meal. 04/12/14   Noland Fordyce, PA-C  potassium chloride SA (K-DUR,KLOR-CON) 20 MEQ tablet Take 20 mEq by mouth daily as needed (cramping).     Historical Provider, MD  sennosides-docusate sodium (SENOKOT-S) 8.6-50 MG tablet Take 1 tablet by mouth at bedtime.    Historical Provider, MD   BP 111/50 mmHg  Pulse 78  Temp(Src) 98 F (36.7 C) (Oral)  Resp 16  Ht 5\' 5"  (1.651 m)  Wt 167 lb 4.8 oz (75.887 kg)  BMI 27.84 kg/m2  SpO2 97% Physical Exam  Constitutional: She is oriented to person, place, and time. She appears well-developed and well-nourished. No distress.  HENT:  Head:  Normocephalic and atraumatic.  Mouth/Throat: Oropharynx is clear and moist.  Eyes: Conjunctivae are normal. Pupils are equal, round, and reactive to light. No scleral icterus.  Neck: Neck supple.  Cardiovascular: Normal rate, regular rhythm, normal heart sounds and intact distal pulses.   No murmur heard. Pulmonary/Chest: Effort normal and breath sounds normal. No stridor. No respiratory distress. She has no wheezes. She has no rales.  Abdominal: Soft. Bowel sounds are normal. She exhibits no distension. There is no tenderness.  Musculoskeletal: Normal range of motion. She exhibits no edema.  Neurological: She is alert and oriented to person, place, and time.  Skin: Skin is warm and dry. No rash noted.  Psychiatric: She has a normal mood and affect. Her behavior is normal.  Nursing note and vitals reviewed.   ED Course  Procedures (including critical care time) Labs Review Labs Reviewed  BASIC METABOLIC PANEL - Abnormal; Notable for the following:    BUN 25 (*)    GFR calc non Af Amer 49 (*)    GFR calc Af Amer 57 (*)    All other components within normal limits  CBC WITH DIFFERENTIAL - Abnormal; Notable for the following:    WBC 3.7 (*)    RBC 3.32 (*)    Hemoglobin 10.3 (*)    HCT 31.0 (*)    Platelets 144 (*)    All other components within normal limits  TROPONIN I  BRAIN NATRIURETIC PEPTIDE  TROPONIN I    Imaging Review Dg Chest 2 View  07/17/2014   CLINICAL DATA:  Shortness of breath.  Ex-smoker.  EXAM: CHEST  2 VIEW  COMPARISON:  06/25/2014.  Chest CT dated 06/12/2014.  FINDINGS: Stable mildly enlarged cardiac silhouette. Mild linear scarring at the right lung base is unchanged. The pulmonary vasculature and interstitial markings remain mildly prominent. No pleural fluid. Diffuse osteopenia. 40% T4 vertebral compression deformity without significant change since 06/12/2014.  IMPRESSION: Stable mild cardiomegaly, mild pulmonary vascular congestion and changes of COPD.    Electronically Signed   By: Enrique Sack M.D.   On: 07/17/2014 10:34   All radiology studies independently viewed by me.      EKG Interpretation   Date/Time:  Sunday July 17 2014 08:33:22 EST Ventricular Rate:  93 PR Interval:  173 QRS Duration: 102 QT Interval:  399 QTC Calculation: 496 R Axis:   7 Text Interpretation:  Sinus rhythm Low voltage, extremity and precordial  leads Abnormal R-wave progression, early transition Borderline prolonged  QT interval Baseline wander in lead(s) III No significant change was found  Confirmed by [facility]  MD, TREY ([id number]) on 07/17/2014 10:47:17 AM      MDM   Final diagnoses:  Shortness of breath  81 yo female presenting after an isolated episode of shortness of breath which was associated with moving quickly to the bathroom. Symptoms now resolved. She is well-appearing with stable vitals. She has had multiple similar ED presentations and workups in the past.  Chest x-ray shows mild pulmonary vascular congestion which appears stable from prior. Plan to check labs and reevaluate.  Labwork is reassuring.  Delta troponin negative.  This episode appears consistent with multiple other ED visits for dyspnea.  She appears stable for discharge home.  Social work has also evaluated pt to help facilitate home health services.    Houston Siren III, MD 07/17/14 [phone]

## 2014-07-17 NOTE — Discharge Instructions (Signed)

## 2014-07-17 NOTE — ED Notes (Signed)
Pt in xray

## 2014-07-17 NOTE — ED Notes (Signed)
   Pt o2 92%-96% while ambulating, Pulse 122-126 while ambulating.

## 2014-07-17 NOTE — ED Notes (Signed)
Notified [facility] for transportation back home 

## 2014-07-17 NOTE — ED Notes (Signed)
Pt is out of the department

## 2014-07-17 NOTE — ED Notes (Signed)
Per EMS- pt complaining of an increased SOB over the last 10 hours. Pt has a history of CHF, and sleeps elevated on pillows. Pt states she gets SOB intermittently, with no drastic weight gain. Pt has bilateral nonpitting edema. Lung sounds clear bilaterally. No complaints of N/V. 98 R/A, 162/70, 82-HR, NSR. L AC IV.

## 2014-07-18 ENCOUNTER — Encounter (HOSPITAL_COMMUNITY): Payer: Self-pay | Admitting: Emergency Medicine

## 2014-07-18 ENCOUNTER — Emergency Department (HOSPITAL_COMMUNITY)
Admission: EM | Admit: 2014-07-18 | Discharge: 2014-07-18 | Disposition: A | Discharge status: Home / Self Care | Payer: Medicare Other | Attending: Emergency Medicine | Admitting: Emergency Medicine

## 2014-07-18 DIAGNOSIS — Z86010 Personal history of colonic polyps: Secondary | ICD-10-CM | POA: Insufficient documentation

## 2014-07-18 DIAGNOSIS — Z7982 Long term (current) use of aspirin: Secondary | ICD-10-CM | POA: Diagnosis not present

## 2014-07-18 DIAGNOSIS — Z79899 Other long term (current) drug therapy: Secondary | ICD-10-CM | POA: Insufficient documentation

## 2014-07-18 DIAGNOSIS — M1990 Unspecified osteoarthritis, unspecified site: Secondary | ICD-10-CM | POA: Diagnosis not present

## 2014-07-18 DIAGNOSIS — I509 Heart failure, unspecified: Secondary | ICD-10-CM | POA: Insufficient documentation

## 2014-07-18 DIAGNOSIS — Z87891 Personal history of nicotine dependence: Secondary | ICD-10-CM | POA: Diagnosis not present

## 2014-07-18 DIAGNOSIS — Z8673 Personal history of transient ischemic attack (TIA), and cerebral infarction without residual deficits: Secondary | ICD-10-CM | POA: Insufficient documentation

## 2014-07-18 DIAGNOSIS — I129 Hypertensive chronic kidney disease with stage 1 through stage 4 chronic kidney disease, or unspecified chronic kidney disease: Secondary | ICD-10-CM | POA: Insufficient documentation

## 2014-07-18 DIAGNOSIS — R0602 Shortness of breath: Secondary | ICD-10-CM | POA: Diagnosis not present

## 2014-07-18 DIAGNOSIS — N183 Chronic kidney disease, stage 3 (moderate): Secondary | ICD-10-CM | POA: Diagnosis not present

## 2014-07-18 DIAGNOSIS — Z8659 Personal history of other mental and behavioral disorders: Secondary | ICD-10-CM | POA: Insufficient documentation

## 2014-07-18 DIAGNOSIS — R0600 Dyspnea, unspecified: Secondary | ICD-10-CM | POA: Diagnosis not present

## 2014-07-18 DIAGNOSIS — I6789 Other cerebrovascular disease: Secondary | ICD-10-CM | POA: Diagnosis not present

## 2014-07-18 DIAGNOSIS — D649 Anemia, unspecified: Secondary | ICD-10-CM | POA: Insufficient documentation

## 2014-07-18 DIAGNOSIS — Z8639 Personal history of other endocrine, nutritional and metabolic disease: Secondary | ICD-10-CM | POA: Diagnosis not present

## 2014-07-18 DIAGNOSIS — K219 Gastro-esophageal reflux disease without esophagitis: Secondary | ICD-10-CM | POA: Diagnosis not present

## 2014-07-18 NOTE — ED Notes (Signed)
Pt unable to find anyone to pick her up, states she needs an ambulance to take her, pt made aware that she will be charged since she does not meet criteria to be taken home by ptar, pt understood.

## 2014-07-18 NOTE — Progress Notes (Addendum)
ED CM consulted concerning home health recommendation for HF program.  Patient present to [facility] ED with c/o SOB and difficulty breathing with PMH of CHF. Reviewed record, Dr. Raelene Bott PCP at the [facility] internal Medicine clinic no showed last appt. / Medicare, discharge 12/15 with [facility] services, she also h. Met with patient at bedside, confirmed information. Patient reports she lives alone at home and no longer has any HH services, she also states, she has not been able to adequately take a shower due to fear of falling in the BR.  Discussed the benefits and  importance of keeping f/u appts patient verbalized understanding. Discussed the recommendation for [facility] service for assisting with self management of HF, she is agreeable. Also spoke with patient regarding the benefits of [facility] services she is agreeable. Offered patient choice of [place] agencies, she once again selected the [facility] services of [place]. Verified address and phone number in record.  Referral faxed in to [facility] intake [phone], received fax confirmation document. ED CM updated Dr. Doy Mince concerning  disposition plan he is agreeable. No further ED CM needs identified.

## 2014-07-18 NOTE — ED Notes (Signed)
Pt seen in ED this am for sob, hx of heart failure. Pt d/c home,states sob is worse now although she still had sob when she was discharged. Pt alert, oriented, 02 sat 100 on room air, no acute distress.

## 2014-07-18 NOTE — ED Provider Notes (Signed)
CSN: [id number]     Arrival date & time 07/18/14  0016 History  This chart was scribed for Sharyon Cable, MD by Rayfield Citizen, ED Scribe. This patient was seen in room B19C/B19C and the patient's care was started at 12:34 AM.    Chief Complaint  Patient presents with  . Shortness of Breath   Patient is a 81 y.o. female presenting with shortness of breath. The history is provided by the patient. No language interpreter was used.  Shortness of Breath Severity:  Mild Onset quality:  Gradual Timing:  Constant Chronicity:  Recurrent Context: activity   Relieved by:  Oxygen Worsened by:  Activity and exertion Ineffective treatments:  None tried Associated symptoms: no chest pain      HPI Comments: Jane [name] is a 81 y.o. female with past medical history of CHF (she has preserved EF) who presents to the Emergency Department complaining of SOB. Patient reports she was seen in the ED this morning, 07/17/14, due to SOB: she was discharged "with SOB" ("I was still short of breath when they sent me home,"), which has worsened throughout the day, acutely worsening with ambulation. She also reports lower back pain that is not new for her. She denies fevers, vomiting, abdominal pain.   She is not on home O2. She is a nonsmoker.   PCP is Luan Moore, MD  Past Medical History  Diagnosis Date  . Anemia   . Anxiety   . Arthritis   . GERD (gastroesophageal reflux disease) 2009    EGD with benign gastric polyp too  . Hyperlipidemia   . Hypertension   . Vitamin D deficiency   . Colon polyp     2009 colonoscopy, not retrieved for pathology  . Hiatal hernia   . Diverticulosis 2009  . Aortic insufficiency     mild  . Stroke 1975  . CKD (chronic kidney disease) stage 3, GFR 30-59 ml/min   . CHF (congestive heart failure)    Past Surgical History  Procedure Laterality Date  . Appendectomy    . Abdominal hysterectomy    . Artery biopsy Left 12/16/2012    Procedure: BIOPSY TEMPORAL  ARTERY;  Surgeon: Mal Misty, MD;  Location: [facility] OR;  Service: Vascular;  Laterality: Left;   Family History  Problem Relation Age of Onset  . Stroke Mother   . Hypertension Mother   . Stroke Father   . Hypertension Father   . Diabetes Sister    History  Substance Use Topics  . Smoking status: Former Smoker    Types: Cigarettes    Quit date: 07/02/1979  . Smokeless tobacco: Former Systems developer  . Alcohol Use: No   OB History    No data available     Review of Systems  Respiratory: Positive for shortness of breath.        Dry cough   Cardiovascular: Negative for chest pain.  Musculoskeletal: Positive for back pain.  All other systems reviewed and are negative.   Allergies  Review of patient's allergies indicates no known allergies.  Home Medications   Prior to Admission medications   Medication Sig Start Date End Date Taking? Authorizing Provider  acetaminophen (TYLENOL) 325 MG tablet Take 650 mg by mouth every 6 (six) hours as needed for mild pain.    Historical Provider, MD  aspirin EC 81 MG tablet Take 81 mg by mouth daily.    Historical Provider, MD  ferrous sulfate 325 (65 FE) MG tablet take 1  tablet by mouth twice a day 05/10/14   Luan Moore, MD  furosemide (LASIX) 20 MG tablet Take 20 mg by mouth 2 (two) times daily.    Historical Provider, MD  hydroxypropyl methylcellulose / hypromellose (ISOPTO TEARS / GONIOVISC) 2.5 % ophthalmic solution Place 1 drop into the left eye 4 (four) times daily as needed for dry eyes. Patient not taking: Reported on 07/17/2014 04/12/14   Noland Fordyce, PA-C  losartan (COZAAR) 50 MG tablet Take 50 mg by mouth daily.    Historical Provider, MD  metoprolol succinate (TOPROL-XL) 100 MG 24 hr tablet Take 1 tablet (100 mg total) by mouth daily. Take with or immediately following a meal. 06/02/14   Luan Moore, MD  omeprazole (PRILOSEC) 20 MG capsule Take 1 capsule (20 mg total) by mouth 2 (two) times daily before a meal. 04/12/14   Noland Fordyce, PA-C  potassium chloride SA (K-DUR,KLOR-CON) 20 MEQ tablet Take 20 mEq by mouth daily.     Historical Provider, MD   BP 132/80 mmHg  Pulse 109  Temp(Src) 98.2 F (36.8 C) (Oral)  Resp 20  SpO2 100% Physical Exam   CONSTITUTIONAL: elderly, frail HEAD: Normocephalic/atraumatic ENMT: Mucous membranes moist NECK: supple no meningeal signs; no JVD SPINE/BACK:entire spine nontender CV: S1/S2 noted, no murmurs/rubs/gallops noted LUNGS: Lungs are clear to auscultation bilaterally, no apparent distress ABDOMEN: soft, nontender, no rebound or guarding, bowel sounds noted throughout abdomen NEURO: Pt is awake/alert/appropriate, moves all extremitiesx4.  No facial droop.   EXTREMITIES: pulses normal/equal, full ROM; no edema  SKIN: warm, color normal PSYCH: no abnormalities of mood noted, alert and oriented to situation  ED Course  Procedures   DIAGNOSTIC STUDIES: Oxygen Saturation is 100% on RA, normal by my interpretation.    COORDINATION OF CARE: 12:41 AM Discussed treatment plan with pt at bedside and pt agreed to plan. 1:22 AM Pt seen in ED on 07/17/14 for similar complaint with extensive negative workup  Review of chart reveals multiple ED evaluations Also review of chart reveals similar presentation/admission December 2015 Here in the ED tonight she ambulated with tech and no hypoxia and no dyspnea noted Will consult her PCP to evaluate for home health needs and close PCP followup   Pt stable in ED I spoke to on call for her PCP (Dr Redmond Pulling) Pt is well known to the clinic but she usually misses appointments despite having transportation They have had social work involved as well as Print production planner but pt still misses appointments She has been resistant to any nursing facility Pt tells me she has in home health aides to assist her at home I discussed importance of following up as outpatient She is awake/alert, acting appropriately, I feel she is safe for  discharge BP 133/53 mmHg  Pulse 109  Temp(Src) 98.3 F (36.8 C) (Oral)  Resp 20  SpO2 95%  Imaging Review Dg Chest 2 View  07/17/2014   CLINICAL DATA:  Shortness of breath.  Ex-smoker.  EXAM: CHEST  2 VIEW  COMPARISON:  06/25/2014.  Chest CT dated 06/12/2014.  FINDINGS: Stable mildly enlarged cardiac silhouette. Mild linear scarring at the right lung base is unchanged. The pulmonary vasculature and interstitial markings remain mildly prominent. No pleural fluid. Diffuse osteopenia. 40% T4 vertebral compression deformity without significant change since 06/12/2014.  IMPRESSION: Stable mild cardiomegaly, mild pulmonary vascular congestion and changes of COPD.   Electronically Signed   By: Enrique Sack M.D.   On: 07/17/2014 10:34     Date:  07/18/14 0045  Rate: 91  Rhythm: normal sinus rhythm  QRS Axis: normal  Intervals: normal  ST/T Wave abnormalities: normal  Conduction Disutrbances:none    MDM   Final diagnoses:  Dyspnea   Nursing notes including past medical history and social history reviewed and considered in documentation xrays/imaging reviewed by myself and considered during evaluation (previous CXR reviewed) Previous records reviewed and considered   I personally performed the services described in this documentation, which was scribed in my presence. The recorded information has been reviewed and is accurate.       Sharyon Cable, MD 07/18/14 [phone]

## 2014-07-20 DIAGNOSIS — I351 Nonrheumatic aortic (valve) insufficiency: Secondary | ICD-10-CM | POA: Diagnosis not present

## 2014-07-20 DIAGNOSIS — I509 Heart failure, unspecified: Secondary | ICD-10-CM | POA: Diagnosis not present

## 2014-07-20 DIAGNOSIS — F419 Anxiety disorder, unspecified: Secondary | ICD-10-CM | POA: Diagnosis not present

## 2014-07-20 DIAGNOSIS — K219 Gastro-esophageal reflux disease without esophagitis: Secondary | ICD-10-CM | POA: Diagnosis not present

## 2014-07-20 DIAGNOSIS — D649 Anemia, unspecified: Secondary | ICD-10-CM | POA: Diagnosis not present

## 2014-07-20 DIAGNOSIS — I10 Essential (primary) hypertension: Secondary | ICD-10-CM | POA: Diagnosis not present

## 2014-07-20 DIAGNOSIS — N189 Chronic kidney disease, unspecified: Secondary | ICD-10-CM | POA: Diagnosis not present

## 2014-07-20 DIAGNOSIS — M1990 Unspecified osteoarthritis, unspecified site: Secondary | ICD-10-CM | POA: Diagnosis not present

## 2014-07-20 DIAGNOSIS — E785 Hyperlipidemia, unspecified: Secondary | ICD-10-CM | POA: Diagnosis not present

## 2014-07-25 DIAGNOSIS — I509 Heart failure, unspecified: Secondary | ICD-10-CM | POA: Diagnosis not present

## 2014-07-25 DIAGNOSIS — D649 Anemia, unspecified: Secondary | ICD-10-CM | POA: Diagnosis not present

## 2014-07-25 DIAGNOSIS — I351 Nonrheumatic aortic (valve) insufficiency: Secondary | ICD-10-CM | POA: Diagnosis not present

## 2014-07-25 DIAGNOSIS — I10 Essential (primary) hypertension: Secondary | ICD-10-CM | POA: Diagnosis not present

## 2014-07-25 DIAGNOSIS — N189 Chronic kidney disease, unspecified: Secondary | ICD-10-CM | POA: Diagnosis not present

## 2014-07-25 DIAGNOSIS — F419 Anxiety disorder, unspecified: Secondary | ICD-10-CM | POA: Diagnosis not present

## 2014-07-26 DIAGNOSIS — I509 Heart failure, unspecified: Secondary | ICD-10-CM | POA: Diagnosis not present

## 2014-07-26 DIAGNOSIS — D649 Anemia, unspecified: Secondary | ICD-10-CM | POA: Diagnosis not present

## 2014-07-26 DIAGNOSIS — I351 Nonrheumatic aortic (valve) insufficiency: Secondary | ICD-10-CM | POA: Diagnosis not present

## 2014-07-26 DIAGNOSIS — F419 Anxiety disorder, unspecified: Secondary | ICD-10-CM | POA: Diagnosis not present

## 2014-07-26 DIAGNOSIS — N189 Chronic kidney disease, unspecified: Secondary | ICD-10-CM | POA: Diagnosis not present

## 2014-07-26 DIAGNOSIS — I10 Essential (primary) hypertension: Secondary | ICD-10-CM | POA: Diagnosis not present

## 2014-07-27 DIAGNOSIS — D649 Anemia, unspecified: Secondary | ICD-10-CM | POA: Diagnosis not present

## 2014-07-27 DIAGNOSIS — F419 Anxiety disorder, unspecified: Secondary | ICD-10-CM | POA: Diagnosis not present

## 2014-07-27 DIAGNOSIS — I509 Heart failure, unspecified: Secondary | ICD-10-CM | POA: Diagnosis not present

## 2014-07-27 DIAGNOSIS — I351 Nonrheumatic aortic (valve) insufficiency: Secondary | ICD-10-CM | POA: Diagnosis not present

## 2014-07-27 DIAGNOSIS — N189 Chronic kidney disease, unspecified: Secondary | ICD-10-CM | POA: Diagnosis not present

## 2014-07-27 DIAGNOSIS — I10 Essential (primary) hypertension: Secondary | ICD-10-CM | POA: Diagnosis not present

## 2014-07-28 DIAGNOSIS — I509 Heart failure, unspecified: Secondary | ICD-10-CM | POA: Diagnosis not present

## 2014-07-28 DIAGNOSIS — N189 Chronic kidney disease, unspecified: Secondary | ICD-10-CM | POA: Diagnosis not present

## 2014-07-28 DIAGNOSIS — I351 Nonrheumatic aortic (valve) insufficiency: Secondary | ICD-10-CM | POA: Diagnosis not present

## 2014-07-28 DIAGNOSIS — D649 Anemia, unspecified: Secondary | ICD-10-CM | POA: Diagnosis not present

## 2014-07-28 DIAGNOSIS — F419 Anxiety disorder, unspecified: Secondary | ICD-10-CM | POA: Diagnosis not present

## 2014-07-28 DIAGNOSIS — I10 Essential (primary) hypertension: Secondary | ICD-10-CM | POA: Diagnosis not present

## 2014-07-29 ENCOUNTER — Emergency Department (HOSPITAL_COMMUNITY): Payer: Medicare Other

## 2014-07-29 ENCOUNTER — Encounter (HOSPITAL_COMMUNITY): Payer: Self-pay | Admitting: *Deleted

## 2014-07-29 ENCOUNTER — Emergency Department (HOSPITAL_COMMUNITY)
Admission: EM | Admit: 2014-07-29 | Discharge: 2014-07-29 | Disposition: A | Discharge status: Home / Self Care | Payer: Medicare Other | Attending: Emergency Medicine | Admitting: Emergency Medicine

## 2014-07-29 DIAGNOSIS — Z79899 Other long term (current) drug therapy: Secondary | ICD-10-CM | POA: Insufficient documentation

## 2014-07-29 DIAGNOSIS — Z8673 Personal history of transient ischemic attack (TIA), and cerebral infarction without residual deficits: Secondary | ICD-10-CM | POA: Diagnosis not present

## 2014-07-29 DIAGNOSIS — Z87891 Personal history of nicotine dependence: Secondary | ICD-10-CM | POA: Insufficient documentation

## 2014-07-29 DIAGNOSIS — R0600 Dyspnea, unspecified: Secondary | ICD-10-CM | POA: Insufficient documentation

## 2014-07-29 DIAGNOSIS — R011 Cardiac murmur, unspecified: Secondary | ICD-10-CM | POA: Insufficient documentation

## 2014-07-29 DIAGNOSIS — I517 Cardiomegaly: Secondary | ICD-10-CM | POA: Diagnosis not present

## 2014-07-29 DIAGNOSIS — N183 Chronic kidney disease, stage 3 (moderate): Secondary | ICD-10-CM | POA: Diagnosis not present

## 2014-07-29 DIAGNOSIS — M1990 Unspecified osteoarthritis, unspecified site: Secondary | ICD-10-CM | POA: Insufficient documentation

## 2014-07-29 DIAGNOSIS — F419 Anxiety disorder, unspecified: Secondary | ICD-10-CM | POA: Diagnosis present

## 2014-07-29 DIAGNOSIS — I129 Hypertensive chronic kidney disease with stage 1 through stage 4 chronic kidney disease, or unspecified chronic kidney disease: Secondary | ICD-10-CM | POA: Diagnosis not present

## 2014-07-29 DIAGNOSIS — I509 Heart failure, unspecified: Secondary | ICD-10-CM | POA: Diagnosis not present

## 2014-07-29 DIAGNOSIS — Z86010 Personal history of colonic polyps: Secondary | ICD-10-CM | POA: Insufficient documentation

## 2014-07-29 DIAGNOSIS — K219 Gastro-esophageal reflux disease without esophagitis: Secondary | ICD-10-CM | POA: Insufficient documentation

## 2014-07-29 DIAGNOSIS — Z7982 Long term (current) use of aspirin: Secondary | ICD-10-CM | POA: Insufficient documentation

## 2014-07-29 DIAGNOSIS — D649 Anemia, unspecified: Secondary | ICD-10-CM | POA: Insufficient documentation

## 2014-07-29 DIAGNOSIS — J811 Chronic pulmonary edema: Secondary | ICD-10-CM | POA: Diagnosis not present

## 2014-07-29 DIAGNOSIS — Z8639 Personal history of other endocrine, nutritional and metabolic disease: Secondary | ICD-10-CM | POA: Diagnosis not present

## 2014-07-29 DIAGNOSIS — R0602 Shortness of breath: Secondary | ICD-10-CM | POA: Diagnosis not present

## 2014-07-29 LAB — CBC WITH DIFFERENTIAL/PLATELET
Basophils Absolute: 0 10*3/uL (ref 0.0–0.1)
Basophils Relative: 0 % (ref 0–1)
EOS PCT: 0 % (ref 0–5)
Eosinophils Absolute: 0 10*3/uL (ref 0.0–0.7)
HCT: 31.3 % — ABNORMAL LOW (ref 36.0–46.0)
Hemoglobin: 10.3 g/dL — ABNORMAL LOW (ref 12.0–15.0)
LYMPHS ABS: 1.7 10*3/uL (ref 0.7–4.0)
Lymphocytes Relative: 41 % (ref 12–46)
MCH: 31.3 pg (ref 26.0–34.0)
MCHC: 32.9 g/dL (ref 30.0–36.0)
MCV: 95.1 fL (ref 78.0–100.0)
MONOS PCT: 8 % (ref 3–12)
Monocytes Absolute: 0.3 10*3/uL (ref 0.1–1.0)
NEUTROS ABS: 2 10*3/uL (ref 1.7–7.7)
NEUTROS PCT: 51 % (ref 43–77)
Platelets: 155 10*3/uL (ref 150–400)
RBC: 3.29 MIL/uL — AB (ref 3.87–5.11)
RDW: 13.3 % (ref 11.5–15.5)
WBC: 4 10*3/uL (ref 4.0–10.5)

## 2014-07-29 LAB — BASIC METABOLIC PANEL
Anion gap: 11 (ref 5–15)
BUN: 28 mg/dL — ABNORMAL HIGH (ref 6–23)
CALCIUM: 9 mg/dL (ref 8.4–10.5)
CO2: 22 mmol/L (ref 19–32)
Chloride: 110 mmol/L (ref 96–112)
Creatinine, Ser: 1.13 mg/dL — ABNORMAL HIGH (ref 0.50–1.10)
GFR calc Af Amer: 51 mL/min — ABNORMAL LOW (ref >90)
GFR, EST NON AFRICAN AMERICAN: 44 mL/min — AB (ref >90)
Glucose, Bld: 86 mg/dL (ref 70–99)
Potassium: 3.7 mmol/L (ref 3.5–5.1)
Sodium: 143 mmol/L (ref 135–145)

## 2014-07-29 LAB — URINALYSIS, ROUTINE W REFLEX MICROSCOPIC
Bilirubin Urine: NEGATIVE
Glucose, UA: NEGATIVE mg/dL
Hgb urine dipstick: NEGATIVE
Ketones, ur: NEGATIVE mg/dL
Nitrite: NEGATIVE
Protein, ur: NEGATIVE mg/dL
SPECIFIC GRAVITY, URINE: 1.008 (ref 1.005–1.030)
Urobilinogen, UA: 0.2 mg/dL (ref 0.0–1.0)
pH: 6 (ref 5.0–8.0)

## 2014-07-29 LAB — TROPONIN I: Troponin I: <0.03 ng/mL (ref <0.031)

## 2014-07-29 LAB — URINE MICROSCOPIC-ADD ON

## 2014-07-29 LAB — BRAIN NATRIURETIC PEPTIDE: B Natriuretic Peptide: 67.1 pg/mL (ref 0.0–100.0)

## 2014-07-29 MED ORDER — FUROSEMIDE 10 MG/ML IJ SOLN: 20.0000 mg | Freq: Once | INTRAMUSCULAR | Status: DC

## 2014-07-29 MED ORDER — FUROSEMIDE 40 MG PO TABS
40.0000 mg | ORAL_TABLET | Freq: Once | ORAL | Status: AC
  Administered 2014-07-29: 40 mg via ORAL
  Filled 2014-07-29: qty 1

## 2014-07-29 NOTE — ED Notes (Signed)
Pulse ox 95% and pulse rate 103

## 2014-07-29 NOTE — ED Notes (Signed)
Bed: HT97 Expected date:  Expected time:  Means of arrival:  Comments: EMS 81 yo female with anxiety and SOB

## 2014-07-29 NOTE — ED Notes (Signed)
EMS called to home.  Found patient with complaints of anxiety and shortness of breath.  Patient has O2 sat of 100% on EMS arrival.  Patient lives at home alone.

## 2014-07-29 NOTE — Discharge Instructions (Signed)
We saw you in the ER for the shortness of breath. All of our cardiac workup is normal, including labs, EKG and chest X-RAY are normal. We are not sure what is causing your discomfort, but we feel comfortable sending you home at this time. The workup in the ER is not complete, and you should follow up with your primary care doctor for further evaluation.   Shortness of Breath Shortness of breath means you have trouble breathing. It could also mean that you have a medical problem. You should get immediate medical care for shortness of breath. CAUSES   Not enough oxygen in the air such as with high altitudes or a smoke-filled room.  Certain lung diseases, infections, or problems.  Heart disease or conditions, such as angina or heart failure.  Low red blood cells (anemia).  Poor physical fitness, which can cause shortness of breath when you exercise.  Chest or back injuries or stiffness.  Being overweight.  Smoking.  Anxiety, which can make you feel like you are not getting enough air. DIAGNOSIS  Serious medical problems can often be found during your physical exam. Tests may also be done to determine why you are having shortness of breath. Tests may include:  Chest X-rays.  Lung function tests.  Blood tests.  An electrocardiogram (ECG).  An ambulatory electrocardiogram. An ambulatory ECG records your heartbeat patterns over a 24-hour period.  Exercise testing.  A transthoracic echocardiogram (TTE). During echocardiography, sound waves are used to evaluate how blood flows through your heart.  A transesophageal echocardiogram (TEE).  Imaging scans. Your health care provider may not be able to find a cause for your shortness of breath after your exam. In this case, it is important to have a follow-up exam with your health care provider as directed.  TREATMENT  Treatment for shortness of breath depends on the cause of your symptoms and can vary greatly. HOME CARE  INSTRUCTIONS   Do not smoke. Smoking is a common cause of shortness of breath. If you smoke, ask for help to quit.  Avoid being around chemicals or things that may bother your breathing, such as paint fumes and dust.  Rest as needed. Slowly resume your usual activities.  If medicines were prescribed, take them as directed for the full length of time directed. This includes oxygen and any inhaled medicines.  Keep all follow-up appointments as directed by your health care provider. SEEK MEDICAL CARE IF:   Your condition does not improve in the time expected.  You have a hard time doing your normal activities even with rest.  You have any new symptoms. SEEK IMMEDIATE MEDICAL CARE IF:   Your shortness of breath gets worse.  You feel light-headed, faint, or develop a cough not controlled with medicines.  You start coughing up blood.  You have pain with breathing.  You have chest pain or pain in your arms, shoulders, or abdomen.  You have a fever.  You are unable to walk up stairs or exercise the way you normally do. MAKE SURE YOU:  Understand these instructions.  Will watch your condition.  Will get help right away if you are not doing well or get worse. Document Released: 03/12/2001 Document Revised: 06/22/2013 Document Reviewed: 09/02/2011 [facility] Patient Information 2015 [place], [place]. This information is not intended to replace advice given to you by your health care provider. Make sure you discuss any questions you have with your health care provider.

## 2014-07-29 NOTE — ED Provider Notes (Signed)
CSN: [id number]     Arrival date & time 07/29/14  7793 History   First MD Initiated Contact with Patient 07/29/14 0455     Chief Complaint  Patient presents with  . Anxiety     (Consider location/radiation/quality/duration/timing/severity/associated sxs/prior Treatment) HPI Comments: Pt comes in with cc of shortness of breath. Pt has hx of Aortic regurg and diastolic CHF. She reports that she started having dyspnea whilst she was watching TV. There is no chest pain, cough. She called EMS, who reported that pt appeared anxious. They didn't see any abnormality on lung or heart exam, and pt received no meds/O2, and pt got better on her own. Pt feels a lot better now. She has no nausea, diophoresis.    ROS 10 Systems reviewed and are negative for acute change except as noted in the HPI.     Patient is a 81 y.o. female presenting with anxiety. The history is provided by the patient.  Anxiety Associated symptoms include shortness of breath. Pertinent negatives include no chest pain.    Past Medical History  Diagnosis Date  . Anemia   . Anxiety   . Arthritis   . GERD (gastroesophageal reflux disease) 2009    EGD with benign gastric polyp too  . Hyperlipidemia   . Hypertension   . Vitamin D deficiency   . Colon polyp     2009 colonoscopy, not retrieved for pathology  . Hiatal hernia   . Diverticulosis 2009  . Aortic insufficiency     mild  . Stroke 1975  . CKD (chronic kidney disease) stage 3, GFR 30-59 ml/min   . CHF (congestive heart failure)    Past Surgical History  Procedure Laterality Date  . Appendectomy    . Abdominal hysterectomy    . Artery biopsy Left 12/16/2012    Procedure: BIOPSY TEMPORAL ARTERY;  Surgeon: Mal Misty, MD;  Location: [facility] OR;  Service: Vascular;  Laterality: Left;   Family History  Problem Relation Age of Onset  . Stroke Mother   . Hypertension Mother   . Stroke Father   . Hypertension Father   . Diabetes Sister    History   Substance Use Topics  . Smoking status: Former Smoker    Types: Cigarettes    Quit date: 07/02/1979  . Smokeless tobacco: Former Systems developer  . Alcohol Use: No   OB History    No data available     Review of Systems  Respiratory: Positive for shortness of breath. Negative for cough.   Cardiovascular: Negative for chest pain.  All other systems reviewed and are negative.     Allergies  Review of patient's allergies indicates no known allergies.  Home Medications   Prior to Admission medications   Medication Sig Start Date End Date Taking? Authorizing Provider  acetaminophen (TYLENOL) 325 MG tablet Take 650 mg by mouth every 6 (six) hours as needed for mild pain.   Yes Historical Provider, MD  aspirin EC 81 MG tablet Take 81 mg by mouth daily.   Yes Historical Provider, MD  ferrous sulfate 325 (65 FE) MG tablet take 1 tablet by mouth twice a day 05/10/14  Yes Luan Moore, MD  furosemide (LASIX) 20 MG tablet Take 20 mg by mouth 2 (two) times daily.   Yes Historical Provider, MD  losartan (COZAAR) 50 MG tablet Take 50 mg by mouth daily.   Yes Historical Provider, MD  metoprolol succinate (TOPROL-XL) 100 MG 24 hr tablet Take 1 tablet (100 mg total)  by mouth daily. Take with or immediately following a meal. 06/02/14  Yes Luan Moore, MD  omeprazole (PRILOSEC) 20 MG capsule Take 1 capsule (20 mg total) by mouth 2 (two) times daily before a meal. 04/12/14  Yes Noland Fordyce, PA-C  potassium chloride SA (K-DUR,KLOR-CON) 20 MEQ tablet Take 20 mEq by mouth daily.    Yes Historical Provider, MD  hydroxypropyl methylcellulose / hypromellose (ISOPTO TEARS / GONIOVISC) 2.5 % ophthalmic solution Place 1 drop into the left eye 4 (four) times daily as needed for dry eyes. Patient not taking: Reported on 07/17/2014 04/12/14   Noland Fordyce, PA-C   BP 160/67 mmHg  Pulse 82  Temp(Src) 98.7 F (37.1 C) (Oral)  Resp 20  SpO2 100% Physical Exam  Constitutional: She is oriented to person, place, and  time. She appears well-developed and well-nourished.  HENT:  Head: Normocephalic and atraumatic.  Eyes: EOM are normal. Pupils are equal, round, and reactive to light.  Neck: Neck supple.  Cardiovascular: Normal rate and regular rhythm.   Murmur heard. Pulmonary/Chest: Effort normal. No respiratory distress. She has no wheezes. She has no rales.  Abdominal: Soft. She exhibits no distension. There is no tenderness. There is no rebound and no guarding.  Neurological: She is alert and oriented to person, place, and time.  Skin: Skin is warm and dry.  Nursing note and vitals reviewed.   ED Course  Procedures (including critical care time) Labs Review Labs Reviewed  CBC WITH DIFFERENTIAL/PLATELET - Abnormal; Notable for the following:    RBC 3.29 (*)    Hemoglobin 10.3 (*)    HCT 31.3 (*)    All other components within normal limits  BASIC METABOLIC PANEL - Abnormal; Notable for the following:    BUN 28 (*)    Creatinine, Ser 1.13 (*)    GFR calc non Af Amer 44 (*)    GFR calc Af Amer 51 (*)    All other components within normal limits  BRAIN NATRIURETIC PEPTIDE  TROPONIN I  URINALYSIS, ROUTINE W REFLEX MICROSCOPIC    Imaging Review Dg Chest 2 View  07/29/2014   CLINICAL DATA:  New onset shortness of breath this morning. Anxiety.  EXAM: CHEST  2 VIEW  COMPARISON:  07/17/2014  FINDINGS: Examination limited due to motion artifact. Mild cardiac enlargement and pulmonary vascular congestion. No edema or consolidation. No blunting of costophrenic angles. No pneumothorax. Calcification of the aorta. Degenerative changes in the spine and shoulders.  IMPRESSION: Mild cardiac enlargement and pulmonary vascular congestion without edema or consolidation.   Electronically Signed   By: Lucienne Capers M.D.   On: 07/29/2014 05:59     EKG Interpretation   Date/Time:  Friday July 29 2014 04:46:41 EST Ventricular Rate:  70 PR Interval:  147 QRS Duration: 85 QT Interval:  418 QTC  Calculation: 451 R Axis:   27 Text Interpretation:  Sinus rhythm Multiple premature complexes, vent  Low  voltage, precordial leads Abnormal R-wave progression, early transition No  significant change since last tracing Confirmed by Antonique Langford, MD, Thelma Comp  [id number]) on 07/29/2014 5:32:00 AM      MDM   Final diagnoses:  Dyspnea    Pt comes in with cc of dyspnea. She has no DIB now, herself feels better. BP is normal. VSS and WNL. Lung exam is normal as well.  Does clinically seem to be anxiety. Given her age, we will get EKG and basic labs. CXR shows mild left sided pulm congestion.  Varney Biles, MD 07/29/14  0803 

## 2014-08-01 DIAGNOSIS — I509 Heart failure, unspecified: Secondary | ICD-10-CM | POA: Diagnosis not present

## 2014-08-01 DIAGNOSIS — I10 Essential (primary) hypertension: Secondary | ICD-10-CM | POA: Diagnosis not present

## 2014-08-01 DIAGNOSIS — D649 Anemia, unspecified: Secondary | ICD-10-CM | POA: Diagnosis not present

## 2014-08-01 DIAGNOSIS — I351 Nonrheumatic aortic (valve) insufficiency: Secondary | ICD-10-CM | POA: Diagnosis not present

## 2014-08-01 DIAGNOSIS — N189 Chronic kidney disease, unspecified: Secondary | ICD-10-CM | POA: Diagnosis not present

## 2014-08-01 DIAGNOSIS — F419 Anxiety disorder, unspecified: Secondary | ICD-10-CM | POA: Diagnosis not present

## 2014-08-02 ENCOUNTER — Other Ambulatory Visit: Payer: Self-pay | Admitting: Internal Medicine

## 2014-08-02 ENCOUNTER — Telehealth: Payer: Self-pay | Admitting: *Deleted

## 2014-08-02 DIAGNOSIS — D649 Anemia, unspecified: Secondary | ICD-10-CM | POA: Diagnosis not present

## 2014-08-02 DIAGNOSIS — I509 Heart failure, unspecified: Secondary | ICD-10-CM | POA: Diagnosis not present

## 2014-08-02 DIAGNOSIS — I351 Nonrheumatic aortic (valve) insufficiency: Secondary | ICD-10-CM | POA: Diagnosis not present

## 2014-08-02 DIAGNOSIS — N189 Chronic kidney disease, unspecified: Secondary | ICD-10-CM | POA: Diagnosis not present

## 2014-08-02 DIAGNOSIS — F419 Anxiety disorder, unspecified: Secondary | ICD-10-CM | POA: Diagnosis not present

## 2014-08-02 DIAGNOSIS — I10 Essential (primary) hypertension: Secondary | ICD-10-CM | POA: Diagnosis not present

## 2014-08-02 NOTE — Telephone Encounter (Signed)
I approve of social work, [employer], and speech therapy consults.

## 2014-08-02 NOTE — Telephone Encounter (Signed)
Call from [facility] with [facility] [phone]  Nurse is asking for a Verbal Order for Social Worker consult to help get PCS services for pt. She is also asking for a Speech Therapy consult to evaluate cognition.   Will this be okay with you? I can call it in.

## 2014-08-04 ENCOUNTER — Telehealth: Payer: Self-pay | Admitting: Licensed Clinical Social Worker

## 2014-08-04 DIAGNOSIS — I5030 Unspecified diastolic (congestive) heart failure: Secondary | ICD-10-CM

## 2014-08-04 NOTE — Telephone Encounter (Signed)
CSW will make referral to [facility] as pt has had multiple ED visits in the last month, since hospital d/c.  In addition, will initiate PCS form as PCP d/c Ms. Loewenstein from hospital admission.

## 2014-08-04 NOTE — Telephone Encounter (Signed)
CSW received call from pt's daughter in law, Jane [name].  Family inquiring if this CSW is the assigned SW for Jane [name].  [facility]  DIL states "I'm just trying to figure out what's going on.  She's been there several times.".  CSW informed family, pt has been to ED but not [facility].  Encouraged family to attend appt with pt on 08/05/14.  DIL states "I told her to schedule her appt in the morning, I work in the afternoons."  CSW encouraged DIL to contact [name] to reschedule to a morning appointment.

## 2014-08-04 NOTE — Addendum Note (Signed)
Addended by: Willow Ora on: 08/04/2014 10:57 AM   Modules accepted: Orders

## 2014-08-04 NOTE — Telephone Encounter (Signed)
PCS form initiated, placed in your mailbox for review and signature.

## 2014-08-05 ENCOUNTER — Ambulatory Visit (INDEPENDENT_AMBULATORY_CARE_PROVIDER_SITE_OTHER): Payer: Medicare Other | Admitting: Internal Medicine

## 2014-08-05 ENCOUNTER — Encounter: Payer: Self-pay | Admitting: Internal Medicine

## 2014-08-05 VITALS — BP 159/64 | HR 96 | Temp 97.9°F | Ht 65.0 in | Wt 167.4 lb

## 2014-08-05 DIAGNOSIS — Z87891 Personal history of nicotine dependence: Secondary | ICD-10-CM

## 2014-08-05 DIAGNOSIS — D649 Anemia, unspecified: Secondary | ICD-10-CM

## 2014-08-05 DIAGNOSIS — L602 Onychogryphosis: Secondary | ICD-10-CM

## 2014-08-05 DIAGNOSIS — F0390 Unspecified dementia without behavioral disturbance: Secondary | ICD-10-CM | POA: Diagnosis not present

## 2014-08-05 DIAGNOSIS — R0609 Other forms of dyspnea: Secondary | ICD-10-CM | POA: Diagnosis not present

## 2014-08-05 DIAGNOSIS — I351 Nonrheumatic aortic (valve) insufficiency: Secondary | ICD-10-CM | POA: Diagnosis not present

## 2014-08-05 DIAGNOSIS — I10 Essential (primary) hypertension: Secondary | ICD-10-CM | POA: Diagnosis not present

## 2014-08-05 DIAGNOSIS — F419 Anxiety disorder, unspecified: Secondary | ICD-10-CM | POA: Diagnosis not present

## 2014-08-05 DIAGNOSIS — I509 Heart failure, unspecified: Secondary | ICD-10-CM | POA: Diagnosis not present

## 2014-08-05 DIAGNOSIS — R0600 Dyspnea, unspecified: Secondary | ICD-10-CM

## 2014-08-05 DIAGNOSIS — Z9119 Patient's noncompliance with other medical treatment and regimen: Secondary | ICD-10-CM | POA: Diagnosis not present

## 2014-08-05 DIAGNOSIS — L821 Other seborrheic keratosis: Secondary | ICD-10-CM | POA: Insufficient documentation

## 2014-08-05 DIAGNOSIS — N189 Chronic kidney disease, unspecified: Secondary | ICD-10-CM | POA: Diagnosis not present

## 2014-08-05 MED ORDER — LOSARTAN POTASSIUM 50 MG PO TABS: 75.0000 mg | ORAL_TABLET | Freq: Every day | ORAL | Status: DC

## 2014-08-05 MED ORDER — ALBUTEROL SULFATE HFA 108 (90 BASE) MCG/ACT IN AERS: 2.0000 | INHALATION_SPRAY | RESPIRATORY_TRACT | Status: DC | PRN

## 2014-08-05 NOTE — Assessment & Plan Note (Signed)
Patient has a history of iron deficiency anemia noted in the chart and is currently on iron supplementation. However, the only iron study in our system is from 2009 which was unremarkable for any iron deficiency. However, it is unclear whether she had already been on iron supplementation at that point. Additionally, patient currently has a normocytic anemia. Hemoglobin level is not excessively low but would question whether there is some relation to her dyspnea. -Continue with iron supplementation for now
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
## 2014-08-05 NOTE — Assessment & Plan Note (Signed)
BP Readings from Last 3 Encounters:  08/05/14 159/64  07/29/14 160/63  07/18/14 133/53    Lab Results  Component Value Date   NA 143 07/29/2014   K 3.7 07/29/2014   CREATININE 1.13* 07/29/2014    Assessment: Blood pressure control:   Progress toward BP goal:    Comments: Patient states that she is compliant with her regimen of losartan 50 mg daily and metoprolol 100 mg daily  Plan: Medications:  Uptitrate losartan from 50 mg daily to 75 mg daily. Educational resources provided:   Self management tools provided:   Other plans: None

## 2014-08-05 NOTE — Patient Instructions (Addendum)
The spots on top of your head are called seborrheic keratoses which are harmless and can appear with age. I have increased the dosage of your blood pressure medication the losartan to 75 mg daily from 50 mg daily. I have also prescribed a albuterol inhaler which can administer if you're short of breath. We can try to see if it helps with your symptoms, though you can stop using it if you don't feel like it helps.   General Instructions:   Please bring your medicines with you each time you come to clinic.  Medicines may include prescription medications, over-the-counter medications, herbal remedies, eye drops, vitamins, or other pills.   Progress Toward Treatment Goals:  Treatment Goal 08/09/2013  Blood pressure deteriorated    Self Care Goals & Plans:  Self Care Goal 10/15/2013  Manage my medications take my medicines as prescribed; bring my medications to every visit; refill my medications on time  Monitor my health keep track of my weight  Eat healthy foods eat more vegetables; eat foods that are low in salt  Be physically active -    No flowsheet data found.   Care Management & Community Referrals:  Referral 09/18/2012  Referrals made for care management support none needed

## 2014-08-05 NOTE — Progress Notes (Signed)
Case discussed with Dr. Raelene Bott at the time of the visit.  We reviewed the resident's history and exam and pertinent patient test results.  I agree with the assessment, diagnosis, and plan of care documented in the resident's note.

## 2014-08-05 NOTE — Assessment & Plan Note (Signed)
Patient inquiring about lesions on the top of her scalp. Upon exam, these are consistent with seborrheic keratoses. Patient was reassured regarding the natural history of these lesions, not requiring further workup. Patient voiced understanding.

## 2014-08-05 NOTE — Assessment & Plan Note (Addendum)
Patient has been evaluated in the emergency department multiple times as well as a admission in December 2015 for similar symptoms. Patient states that she has dyspnea upon exertion when she tries to go to the bathroom. Multiple workup thus far has made pulmonary embolism, decompensated heart failure, arrhythmia less likely. She states that she does not have any associated wheezing, chest pain, palpitations, anxiety, dizziness, paresthesias, shaking, diaphoresis. This could be secondary to anxiety though patient is denying any of the associated symptoms of a panic attack. Additionally, this could be secondary to deconditioning as well as her baseline heart failure. Patient is noted to have hypertension today. Patient has not had any pulmonary function tests on file, though she consistently presents with minimal physical exam findings to the hospital. -Try to optimize afterload reduction with the increase of losartan to 75 mg daily from 50 mg daily. -Trial albuterol inhaler (patient former smoker but quit in 1981) -Will minimize extent of workup at this point given patient's extensive history of no-shows and noncompliance. We will try to proceed in a stepwise fashion to try to increase compliance. May consider a stress test and PFTs in the future.

## 2014-08-05 NOTE — Assessment & Plan Note (Signed)
Patient has been evaluated in the emergency department multiple times as well as a admission in December 2015 for similar symptoms. Patient states that she has dyspnea upon exertion when she tries to go to the bathroom. Multiple workup thus far has made pulmonary embolism, decompensated heart failure, arrhythmia less likely. She states that she does not have any associated wheezing, chest pain, palpitations, anxiety, dizziness, paresthesias, shaking, diaphoresis. This could be secondary to anxiety though patient is denying any of the associated symptoms of a panic attack. Additionally, this could be secondary to deconditioning as well as her baseline heart failure. Patient is noted to have hypertension today. Patient has not had any pulmonary function tests on file, though she consistently presents with minimal physical exam findings to the hospital. -Try to optimize afterload reduction with the increase of losartan to 75 mg daily from 50 mg daily. -Trial albuterol inhaler (patient former smoker but quit in 1981) -Will minimize extent of workup at this point given patient's extensive history of no-shows and noncompliance.

## 2014-08-06 ENCOUNTER — Other Ambulatory Visit: Payer: Self-pay

## 2014-08-06 ENCOUNTER — Emergency Department (HOSPITAL_COMMUNITY)
Admission: EM | Admit: 2014-08-06 | Discharge: 2014-08-06 | Disposition: A | Discharge status: Home / Self Care | Payer: Medicare Other | Source: Home / Self Care | Attending: Emergency Medicine | Admitting: Emergency Medicine

## 2014-08-06 ENCOUNTER — Encounter (HOSPITAL_COMMUNITY): Payer: Self-pay | Admitting: Emergency Medicine

## 2014-08-06 ENCOUNTER — Emergency Department (HOSPITAL_COMMUNITY): Payer: Medicare Other

## 2014-08-06 DIAGNOSIS — Z8659 Personal history of other mental and behavioral disorders: Secondary | ICD-10-CM | POA: Insufficient documentation

## 2014-08-06 DIAGNOSIS — R079 Chest pain, unspecified: Secondary | ICD-10-CM | POA: Diagnosis not present

## 2014-08-06 DIAGNOSIS — Z79899 Other long term (current) drug therapy: Secondary | ICD-10-CM | POA: Insufficient documentation

## 2014-08-06 DIAGNOSIS — R0609 Other forms of dyspnea: Principal | ICD-10-CM

## 2014-08-06 DIAGNOSIS — N183 Chronic kidney disease, stage 3 (moderate): Secondary | ICD-10-CM | POA: Insufficient documentation

## 2014-08-06 DIAGNOSIS — Z86010 Personal history of colonic polyps: Secondary | ICD-10-CM | POA: Insufficient documentation

## 2014-08-06 DIAGNOSIS — I2510 Atherosclerotic heart disease of native coronary artery without angina pectoris: Secondary | ICD-10-CM | POA: Diagnosis not present

## 2014-08-06 DIAGNOSIS — Z8673 Personal history of transient ischemic attack (TIA), and cerebral infarction without residual deficits: Secondary | ICD-10-CM | POA: Insufficient documentation

## 2014-08-06 DIAGNOSIS — Z7982 Long term (current) use of aspirin: Secondary | ICD-10-CM | POA: Insufficient documentation

## 2014-08-06 DIAGNOSIS — K219 Gastro-esophageal reflux disease without esophagitis: Secondary | ICD-10-CM | POA: Insufficient documentation

## 2014-08-06 DIAGNOSIS — N179 Acute kidney failure, unspecified: Secondary | ICD-10-CM | POA: Diagnosis not present

## 2014-08-06 DIAGNOSIS — F0390 Unspecified dementia without behavioral disturbance: Secondary | ICD-10-CM | POA: Diagnosis not present

## 2014-08-06 DIAGNOSIS — M1990 Unspecified osteoarthritis, unspecified site: Secondary | ICD-10-CM | POA: Insufficient documentation

## 2014-08-06 DIAGNOSIS — I517 Cardiomegaly: Secondary | ICD-10-CM | POA: Diagnosis not present

## 2014-08-06 DIAGNOSIS — J449 Chronic obstructive pulmonary disease, unspecified: Secondary | ICD-10-CM | POA: Diagnosis not present

## 2014-08-06 DIAGNOSIS — Z87891 Personal history of nicotine dependence: Secondary | ICD-10-CM | POA: Insufficient documentation

## 2014-08-06 DIAGNOSIS — R0602 Shortness of breath: Secondary | ICD-10-CM | POA: Diagnosis not present

## 2014-08-06 DIAGNOSIS — I129 Hypertensive chronic kidney disease with stage 1 through stage 4 chronic kidney disease, or unspecified chronic kidney disease: Secondary | ICD-10-CM | POA: Diagnosis not present

## 2014-08-06 DIAGNOSIS — I509 Heart failure, unspecified: Secondary | ICD-10-CM | POA: Insufficient documentation

## 2014-08-06 DIAGNOSIS — I214 Non-ST elevation (NSTEMI) myocardial infarction: Secondary | ICD-10-CM | POA: Diagnosis not present

## 2014-08-06 DIAGNOSIS — D649 Anemia, unspecified: Secondary | ICD-10-CM | POA: Insufficient documentation

## 2014-08-06 DIAGNOSIS — J9811 Atelectasis: Secondary | ICD-10-CM | POA: Diagnosis not present

## 2014-08-06 DIAGNOSIS — R0600 Dyspnea, unspecified: Secondary | ICD-10-CM | POA: Insufficient documentation

## 2014-08-06 LAB — COMPREHENSIVE METABOLIC PANEL
ALT: 11 U/L (ref 0–35)
AST: 21 U/L (ref 0–37)
Albumin: 3.8 g/dL (ref 3.5–5.2)
Alkaline Phosphatase: 77 U/L (ref 39–117)
Anion gap: 9 (ref 5–15)
BILIRUBIN TOTAL: 0.8 mg/dL (ref 0.3–1.2)
BUN: 19 mg/dL (ref 6–23)
CALCIUM: 9.1 mg/dL (ref 8.4–10.5)
CHLORIDE: 110 mmol/L (ref 96–112)
CO2: 21 mmol/L (ref 19–32)
Creatinine, Ser: 1.16 mg/dL — ABNORMAL HIGH (ref 0.50–1.10)
GFR calc Af Amer: 49 mL/min — ABNORMAL LOW (ref >90)
GFR, EST NON AFRICAN AMERICAN: 42 mL/min — AB (ref >90)
Glucose, Bld: 115 mg/dL — ABNORMAL HIGH (ref 70–99)
POTASSIUM: 3.2 mmol/L — AB (ref 3.5–5.1)
Sodium: 140 mmol/L (ref 135–145)
TOTAL PROTEIN: 7.2 g/dL (ref 6.0–8.3)

## 2014-08-06 LAB — CBC WITH DIFFERENTIAL/PLATELET
BASOS ABS: 0 10*3/uL (ref 0.0–0.1)
Basophils Relative: 0 % (ref 0–1)
Eosinophils Absolute: 0 10*3/uL (ref 0.0–0.7)
Eosinophils Relative: 0 % (ref 0–5)
HCT: 31.1 % — ABNORMAL LOW (ref 36.0–46.0)
Hemoglobin: 10.5 g/dL — ABNORMAL LOW (ref 12.0–15.0)
LYMPHS PCT: 50 % — AB (ref 12–46)
Lymphs Abs: 2 10*3/uL (ref 0.7–4.0)
MCH: 31.1 pg (ref 26.0–34.0)
MCHC: 33.8 g/dL (ref 30.0–36.0)
MCV: 92 fL (ref 78.0–100.0)
MONO ABS: 0.3 10*3/uL (ref 0.1–1.0)
MONOS PCT: 7 % (ref 3–12)
NEUTROS ABS: 1.7 10*3/uL (ref 1.7–7.7)
Neutrophils Relative %: 43 % (ref 43–77)
Platelets: 144 10*3/uL — ABNORMAL LOW (ref 150–400)
RBC: 3.38 MIL/uL — ABNORMAL LOW (ref 3.87–5.11)
RDW: 13.4 % (ref 11.5–15.5)
WBC: 3.9 10*3/uL — ABNORMAL LOW (ref 4.0–10.5)

## 2014-08-06 LAB — TROPONIN I: TROPONIN I: 0.03 ng/mL (ref <0.031)

## 2014-08-06 LAB — BRAIN NATRIURETIC PEPTIDE: B Natriuretic Peptide: 147.9 pg/mL — ABNORMAL HIGH (ref 0.0–100.0)

## 2014-08-06 MED ORDER — LORAZEPAM 2 MG/ML IJ SOLN
0.5000 mg | Freq: Once | INTRAMUSCULAR | Status: AC
  Administered 2014-08-06: 0.5 mg via INTRAVENOUS
  Filled 2014-08-06: qty 1

## 2014-08-06 MED ORDER — SODIUM CHLORIDE 0.9 % IV BOLUS (SEPSIS)
1000.0000 mL | Freq: Once | INTRAVENOUS | Status: AC
  Administered 2014-08-06: 1000 mL via INTRAVENOUS

## 2014-08-06 MED ORDER — LORAZEPAM 2 MG/ML IJ SOLN: 1.0000 mg | Freq: Once | INTRAMUSCULAR | Status: DC

## 2014-08-06 MED ORDER — LORAZEPAM 2 MG/ML IJ SOLN
1.0000 mg | Freq: Once | INTRAMUSCULAR | Status: AC
Start: 2014-08-06 — End: 2014-08-06
  Administered 2014-08-06: 1 mg via INTRAVENOUS
  Filled 2014-08-06: qty 1

## 2014-08-06 MED ORDER — IOHEXOL 350 MG/ML SOLN
80.0000 mL | Freq: Once | INTRAVENOUS | Status: AC | PRN
  Administered 2014-08-06: 80 mL via INTRAVENOUS

## 2014-08-06 NOTE — ED Notes (Signed)
Notified [facility] for transportation back home 

## 2014-08-06 NOTE — ED Notes (Signed)
Report given to Duncan, RN and Printmaker.  Pt. To be transferred home via [facility].

## 2014-08-06 NOTE — ED Notes (Signed)
Upon unhooking pt from monitor for chest x-ray, pt hr at 150's, complaining of burning in chest; MD notified, ECG taken and given to MD. Per MD send to chest x-ray.

## 2014-08-06 NOTE — ED Provider Notes (Signed)
CSN: [id number]     Arrival date & time 08/06/14  6720 History   First MD Initiated Contact with Patient 08/06/14 [phone]     Chief Complaint  Patient presents with  . Shortness of Breath     (Consider location/radiation/quality/duration/timing/severity/associated sxs/prior Treatment) Patient is a 81 y.o. female presenting with shortness of breath.  Shortness of Breath Severity:  Moderate Onset quality:  Gradual Duration:  1 day Timing:  Constant Progression:  Unchanged Chronicity:  Recurrent Context: not URI   Relieved by:  Nothing Worsened by:  Exertion Ineffective treatments:  None tried Associated symptoms: no abdominal pain, no cough, no fever, no syncope and no vomiting     Past Medical History  Diagnosis Date  . Anemia   . Anxiety   . Arthritis   . GERD (gastroesophageal reflux disease) 2009    EGD with benign gastric polyp too  . Hyperlipidemia   . Hypertension   . Vitamin D deficiency   . Colon polyp     2009 colonoscopy, not retrieved for pathology  . Hiatal hernia   . Diverticulosis 2009  . Aortic insufficiency     mild  . Stroke 1975  . CKD (chronic kidney disease) stage 3, GFR 30-59 ml/min   . CHF (congestive heart failure)    Past Surgical History  Procedure Laterality Date  . Appendectomy    . Abdominal hysterectomy    . Artery biopsy Left 12/16/2012    Procedure: BIOPSY TEMPORAL ARTERY;  Surgeon: Mal Misty, MD;  Location: [facility] OR;  Service: Vascular;  Laterality: Left;   Family History  Problem Relation Age of Onset  . Stroke Mother   . Hypertension Mother   . Stroke Father   . Hypertension Father   . Diabetes Sister    History  Substance Use Topics  . Smoking status: Former Smoker    Types: Cigarettes    Quit date: 07/02/1979  . Smokeless tobacco: Former Systems developer  . Alcohol Use: No   OB History    No data available     Review of Systems  Constitutional: Negative for fever.  Respiratory: Positive for shortness of breath. Negative for  cough.   Cardiovascular: Negative for syncope.  Gastrointestinal: Negative for vomiting and abdominal pain.  All other systems reviewed and are negative.     Allergies  Review of patient's allergies indicates no known allergies.  Home Medications   Prior to Admission medications   Medication Sig Start Date End Date Taking? Authorizing Provider  acetaminophen (TYLENOL) 325 MG tablet Take 650 mg by mouth every 6 (six) hours as needed for mild pain.   Yes Historical Provider, MD  albuterol (PROVENTIL HFA;VENTOLIN HFA) 108 (90 BASE) MCG/ACT inhaler Inhale 2 puffs into the lungs every 4 (four) hours as needed for wheezing or shortness of breath. 08/05/14  Yes Luan Moore, MD  aspirin EC 81 MG tablet Take 81 mg by mouth daily.   Yes Historical Provider, MD  ferrous sulfate 325 (65 FE) MG tablet take 1 tablet by mouth twice a day 05/10/14  Yes Luan Moore, MD  furosemide (LASIX) 20 MG tablet Take 20 mg by mouth 2 (two) times daily.   Yes Historical Provider, MD  hydroxypropyl methylcellulose / hypromellose (ISOPTO TEARS / GONIOVISC) 2.5 % ophthalmic solution Place 1 drop into the left eye 4 (four) times daily as needed for dry eyes. 04/12/14  Yes Noland Fordyce, PA-C  losartan (COZAAR) 50 MG tablet Take 1.5 tablets (75 mg total) by mouth daily.  08/05/14  Yes Luan Moore, MD  metoprolol succinate (TOPROL-XL) 100 MG 24 hr tablet Take 1 tablet (100 mg total) by mouth daily. Take with or immediately following a meal. 06/02/14  Yes Luan Moore, MD  omeprazole (PRILOSEC) 20 MG capsule Take 1 capsule (20 mg total) by mouth 2 (two) times daily before a meal. 04/12/14  Yes Noland Fordyce, PA-C  potassium chloride SA (K-DUR,KLOR-CON) 20 MEQ tablet take 1 tablet by mouth once daily 08/03/14  Yes Luan Moore, MD   BP 140/57 mmHg  Pulse 87  Temp(Src) 98.1 F (36.7 C) (Oral)  Resp 20  SpO2 100% Physical Exam  Constitutional: She is oriented to person, place, and time. She appears well-developed and  well-nourished.  HENT:  Head: Normocephalic and atraumatic.  Right Ear: External ear normal.  Left Ear: External ear normal.  Eyes: Conjunctivae and EOM are normal. Pupils are equal, round, and reactive to light.  Neck: Normal range of motion. Neck supple.  Cardiovascular: Normal rate, regular rhythm, normal heart sounds and intact distal pulses.   Pulmonary/Chest: Effort normal. She has rales in the right lower field and the left lower field.  Abdominal: Soft. Bowel sounds are normal. There is no tenderness.  Musculoskeletal: Normal range of motion.  Neurological: She is alert and oriented to person, place, and time.  Skin: Skin is warm and dry.  Vitals reviewed.   ED Course  Procedures (including critical care time) Labs Review Labs Reviewed  COMPREHENSIVE METABOLIC PANEL - Abnormal; Notable for the following:    Potassium 3.2 (*)    Glucose, Bld 115 (*)    Creatinine, Ser 1.16 (*)    GFR calc non Af Amer 42 (*)    GFR calc Af Amer 49 (*)    All other components within normal limits  CBC WITH DIFFERENTIAL/PLATELET - Abnormal; Notable for the following:    WBC 3.9 (*)    RBC 3.38 (*)    Hemoglobin 10.5 (*)    HCT 31.1 (*)    Platelets 144 (*)    Lymphocytes Relative 50 (*)    All other components within normal limits  BRAIN NATRIURETIC PEPTIDE - Abnormal; Notable for the following:    B Natriuretic Peptide 147.9 (*)    All other components within normal limits  TROPONIN I  TROPONIN I    Imaging Review Dg Chest 2 View  08/06/2014   CLINICAL DATA:  Shortness of breath with chest and back pain.  EXAM: CHEST  2 VIEW  COMPARISON:  07/29/2014  FINDINGS: Lungs are adequately inflated with minimal prominence of the perihilar markings compatible mild stable vascular congestion. No definite effusion. Flattening of the hemidiaphragms on the lateral film. Mild stable cardiomegaly. Calcified plaque is present over the thoracic aorta. There are degenerative changes of the spine.  Evidence of a mild compression deformity of the upper thoracic spine unchanged.  IMPRESSION: Mild cardiomegaly and suggestion of minimal vascular congestion unchanged.  Stable upper thoracic spine compression fracture.   Electronically Signed   By: Marin Olp M.D.   On: 08/06/2014 08:28   Ct Angio Chest Pe W/cm &/or Wo Cm  08/06/2014   CLINICAL DATA:  81 year old female with shortness of breath, nonproductive cough, tachycardia and pain between the shoulders and chest.  EXAM: CT ANGIOGRAPHY CHEST WITH CONTRAST  TECHNIQUE: Multidetector CT imaging of the chest was performed using the standard protocol during bolus administration of intravenous contrast. Multiplanar CT image reconstructions and MIPs were obtained to evaluate the vascular anatomy.  CONTRAST:  49mL OMNIPAQUE IOHEXOL 350 MG/ML SOLN  COMPARISON:  Chest x-ray obtained earlier today ; prior chest CT 06/12/2014  FINDINGS: Mediastinum: Unremarkable CT appearance of the thyroid gland. No suspicious mediastinal or hilar adenopathy. Small calcified right hilar nodes. No soft tissue mediastinal mass. Small hiatal hernia.  Heart/Vascular: Adequate opacification of the pulmonary arteries to the proximal subsegmental level. No central filling defect to suggest acute pulmonary embolus. Main pulmonary artery within normal limits for size. Heart is at the upper limits of normal for size. Atherosclerotic calcifications noted in the left main, left anterior descending, circumflex and right coronary arteries. Normal caliber thoracic aorta with conventional 3 vessel arch anatomy.  Lungs/Pleura: No pleural effusion. Respiratory motion in the lower lobes limits evaluation for small pulmonary nodules. No evidence of pneumothorax or pulmonary edema. No focal airspace consolidation. Dependent atelectasis present in the bilateral lower lobes.  Bones/Soft Tissues: No acute fracture or aggressive appearing lytic or blastic osseous lesion.  Upper Abdomen: Visualized upper  abdominal organs are unremarkable.  Review of the MIP images confirms the above findings.  IMPRESSION: 1. Negative for acute pulmonary embolus, pneumonia or other acute cardiopulmonary process. 2. 4 vessel coronary artery calcification. 3. Borderline cardiomegaly. 4. Small hiatal hernia. 5. Bilateral dependent lower lobe atelectasis.   Electronically Signed   By: Jacqulynn Cadet M.D.   On: 08/06/2014 11:47     EKG Interpretation   Date/Time:  Saturday August 06 2014 07:32:10 EST Ventricular Rate:  144 PR Interval:  103 QRS Duration: 125 QT Interval:  358 QTC Calculation: 554 R Axis:   -32 Text Interpretation:  Wide-QRS tachycardia Multiple ventricular premature  complexes Right bundle branch block Confirmed by Debby Freiberg [phone])  on 08/06/2014 7:51:01 AM      MDM   Final diagnoses:  Dyspnea on exertion    81 y.o. female with pertinent PMH of COPD, CHF, anxiety presents with recurrent dyspnea.  She has a ho chronic exertional dyspnea, however when asked states that she does not normally feel short of breath.  After discussion about numerous visits, the pt does admit that she has had the same symptoms for quite a long time and did not realize that orthopnea and exertional dyspnea could be present for long periods of time with chronic COPD and CHF.  No recent infectious symptoms, leg swelling, or immobilization.  On EMS arrival the pt was given albuterol, solumedrol.  On arrival the pt was well appearing and was very mildly tachycardic. She stated albuterol helped her symptoms.  She was placed on albuterol per protocol despite no wheezing due to complaint of dyspnea and ho COPD.  HR jumped to 130s and pt had worsening of symptoms, subsequently I dc'ed the albuterol and pt had improvement in tachycardia and symptoms.  ECG prior to 2nd albuterol demonstrated sinus tachycardia at a rate of 105.  Subsequent ECG with increase in rate and different morphology wide QRS in precordial leads,  however still sinus, and with narrow complexes in other leads, making BBB or other pathology more likely.  Obtained CT PE study due to tachycardia prior to albuterol which was unremarkable for PE or other concerning pathology.  Pt requested food, and was given ativan for anxiety and had relief of symptoms and tachycardia.  HR <100 on my examination prior to pt dc.  Also negative delta troponin obtained, making acute ACS unlikely in pt with chronic exertional dyspnea.   DC home in stable condition to fu with her PCP.     I have  reviewed all laboratory and imaging studies if ordered as above  1. Dyspnea on exertion         Debby Freiberg, MD 08/07/14 [phone]

## 2014-08-06 NOTE — Discharge Instructions (Signed)

## 2014-08-06 NOTE — ED Notes (Signed)
Pt attempting to find ride home at this time. 

## 2014-08-06 NOTE — ED Notes (Signed)
Pt complaining of burning pain in between shoulder blades in back. Pt sob, hr at 123. Family at bedside.

## 2014-08-06 NOTE — ED Notes (Signed)
Pt. reports SOB onset this morning with occasional dry cough , received Solumedrol 125 mg. IV and Albuterol 10 mg/Atrovent 0.5 mg nebulizer treatment by EMS prior to arrival with relief. Denies fever or chills. Seen by her PCP yesterday - increased dosage of Losartan and prescribed with albuterol inhaler but unable to fill prescription .

## 2014-08-06 NOTE — ED Notes (Signed)
Ordered diet tray 

## 2014-08-06 NOTE — ED Notes (Signed)
Lunch Tray ordered 

## 2014-08-07 ENCOUNTER — Other Ambulatory Visit: Payer: Self-pay

## 2014-08-07 ENCOUNTER — Inpatient Hospital Stay (HOSPITAL_COMMUNITY): Payer: Medicare Other

## 2014-08-07 ENCOUNTER — Encounter (HOSPITAL_COMMUNITY): Payer: Self-pay | Admitting: Oncology

## 2014-08-07 ENCOUNTER — Inpatient Hospital Stay (HOSPITAL_COMMUNITY)
Admission: EM | Admit: 2014-08-07 | Discharge: 2014-08-10 | DRG: 247 | Disposition: A | Discharge status: Skilled Nursing Facility | Payer: Medicare Other | Attending: Internal Medicine | Admitting: Internal Medicine

## 2014-08-07 ENCOUNTER — Other Ambulatory Visit (HOSPITAL_COMMUNITY): Payer: Self-pay

## 2014-08-07 DIAGNOSIS — R0609 Other forms of dyspnea: Secondary | ICD-10-CM | POA: Diagnosis not present

## 2014-08-07 DIAGNOSIS — I6789 Other cerebrovascular disease: Secondary | ICD-10-CM | POA: Diagnosis not present

## 2014-08-07 DIAGNOSIS — I2584 Coronary atherosclerosis due to calcified coronary lesion: Secondary | ICD-10-CM | POA: Diagnosis present

## 2014-08-07 DIAGNOSIS — M6281 Muscle weakness (generalized): Secondary | ICD-10-CM | POA: Diagnosis not present

## 2014-08-07 DIAGNOSIS — R0602 Shortness of breath: Secondary | ICD-10-CM

## 2014-08-07 DIAGNOSIS — I2510 Atherosclerotic heart disease of native coronary artery without angina pectoris: Secondary | ICD-10-CM | POA: Diagnosis not present

## 2014-08-07 DIAGNOSIS — I129 Hypertensive chronic kidney disease with stage 1 through stage 4 chronic kidney disease, or unspecified chronic kidney disease: Secondary | ICD-10-CM | POA: Diagnosis present

## 2014-08-07 DIAGNOSIS — I5030 Unspecified diastolic (congestive) heart failure: Secondary | ICD-10-CM

## 2014-08-07 DIAGNOSIS — E785 Hyperlipidemia, unspecified: Secondary | ICD-10-CM | POA: Diagnosis present

## 2014-08-07 DIAGNOSIS — D649 Anemia, unspecified: Secondary | ICD-10-CM | POA: Diagnosis present

## 2014-08-07 DIAGNOSIS — Z8673 Personal history of transient ischemic attack (TIA), and cerebral infarction without residual deficits: Secondary | ICD-10-CM | POA: Diagnosis not present

## 2014-08-07 DIAGNOSIS — K219 Gastro-esophageal reflux disease without esophagitis: Secondary | ICD-10-CM | POA: Diagnosis present

## 2014-08-07 DIAGNOSIS — I214 Non-ST elevation (NSTEMI) myocardial infarction: Principal | ICD-10-CM | POA: Diagnosis present

## 2014-08-07 DIAGNOSIS — Z87891 Personal history of nicotine dependence: Secondary | ICD-10-CM

## 2014-08-07 DIAGNOSIS — F0390 Unspecified dementia without behavioral disturbance: Secondary | ICD-10-CM | POA: Diagnosis present

## 2014-08-07 DIAGNOSIS — N183 Chronic kidney disease, stage 3 (moderate): Secondary | ICD-10-CM | POA: Diagnosis present

## 2014-08-07 DIAGNOSIS — R0600 Dyspnea, unspecified: Secondary | ICD-10-CM

## 2014-08-07 DIAGNOSIS — I240 Acute coronary thrombosis not resulting in myocardial infarction: Secondary | ICD-10-CM | POA: Diagnosis not present

## 2014-08-07 DIAGNOSIS — Z955 Presence of coronary angioplasty implant and graft: Secondary | ICD-10-CM | POA: Diagnosis present

## 2014-08-07 DIAGNOSIS — R7989 Other specified abnormal findings of blood chemistry: Secondary | ICD-10-CM | POA: Diagnosis not present

## 2014-08-07 DIAGNOSIS — F419 Anxiety disorder, unspecified: Secondary | ICD-10-CM | POA: Diagnosis not present

## 2014-08-07 DIAGNOSIS — R0789 Other chest pain: Secondary | ICD-10-CM

## 2014-08-07 DIAGNOSIS — F410 Panic disorder [episodic paroxysmal anxiety] without agoraphobia: Secondary | ICD-10-CM | POA: Diagnosis present

## 2014-08-07 DIAGNOSIS — Z79899 Other long term (current) drug therapy: Secondary | ICD-10-CM

## 2014-08-07 DIAGNOSIS — I10 Essential (primary) hypertension: Secondary | ICD-10-CM | POA: Diagnosis not present

## 2014-08-07 DIAGNOSIS — I213 ST elevation (STEMI) myocardial infarction of unspecified site: Secondary | ICD-10-CM | POA: Diagnosis not present

## 2014-08-07 DIAGNOSIS — I872 Venous insufficiency (chronic) (peripheral): Secondary | ICD-10-CM | POA: Diagnosis present

## 2014-08-07 DIAGNOSIS — D509 Iron deficiency anemia, unspecified: Secondary | ICD-10-CM | POA: Diagnosis present

## 2014-08-07 DIAGNOSIS — M1990 Unspecified osteoarthritis, unspecified site: Secondary | ICD-10-CM | POA: Diagnosis not present

## 2014-08-07 DIAGNOSIS — I509 Heart failure, unspecified: Secondary | ICD-10-CM | POA: Diagnosis not present

## 2014-08-07 DIAGNOSIS — H02105 Unspecified ectropion of left lower eyelid: Secondary | ICD-10-CM | POA: Diagnosis present

## 2014-08-07 DIAGNOSIS — Z7982 Long term (current) use of aspirin: Secondary | ICD-10-CM | POA: Diagnosis not present

## 2014-08-07 DIAGNOSIS — J449 Chronic obstructive pulmonary disease, unspecified: Secondary | ICD-10-CM | POA: Diagnosis not present

## 2014-08-07 DIAGNOSIS — N179 Acute kidney failure, unspecified: Secondary | ICD-10-CM | POA: Diagnosis not present

## 2014-08-07 DIAGNOSIS — R079 Chest pain, unspecified: Secondary | ICD-10-CM | POA: Diagnosis not present

## 2014-08-07 DIAGNOSIS — H02109 Unspecified ectropion of unspecified eye, unspecified eyelid: Secondary | ICD-10-CM | POA: Diagnosis not present

## 2014-08-07 DIAGNOSIS — H02106 Unspecified ectropion of left eye, unspecified eyelid: Secondary | ICD-10-CM | POA: Diagnosis not present

## 2014-08-07 DIAGNOSIS — I739 Peripheral vascular disease, unspecified: Secondary | ICD-10-CM | POA: Diagnosis present

## 2014-08-07 LAB — CBC WITH DIFFERENTIAL/PLATELET
Basophils Absolute: 0 10*3/uL (ref 0.0–0.1)
Basophils Relative: 0 % (ref 0–1)
Eosinophils Absolute: 0 10*3/uL (ref 0.0–0.7)
Eosinophils Relative: 0 % (ref 0–5)
HCT: 30.5 % — ABNORMAL LOW (ref 36.0–46.0)
Hemoglobin: 10.1 g/dL — ABNORMAL LOW (ref 12.0–15.0)
Lymphocytes Relative: 11 % — ABNORMAL LOW (ref 12–46)
Lymphs Abs: 0.8 10*3/uL (ref 0.7–4.0)
MCH: 31.4 pg (ref 26.0–34.0)
MCHC: 33.1 g/dL (ref 30.0–36.0)
MCV: 94.7 fL (ref 78.0–100.0)
Monocytes Absolute: 0.5 10*3/uL (ref 0.1–1.0)
Monocytes Relative: 7 % (ref 3–12)
Neutro Abs: 6.2 10*3/uL (ref 1.7–7.7)
Neutrophils Relative %: 82 % — ABNORMAL HIGH (ref 43–77)
Platelets: 158 10*3/uL (ref 150–400)
RBC: 3.22 MIL/uL — ABNORMAL LOW (ref 3.87–5.11)
RDW: 13.4 % (ref 11.5–15.5)
WBC: 7.5 10*3/uL (ref 4.0–10.5)

## 2014-08-07 LAB — I-STAT TROPONIN, ED: Troponin i, poc: 0.3 ng/mL (ref 0.00–0.08)

## 2014-08-07 LAB — BASIC METABOLIC PANEL
Anion gap: 6 (ref 5–15)
BUN: 24 mg/dL — ABNORMAL HIGH (ref 6–23)
CO2: 22 mmol/L (ref 19–32)
Calcium: 9.4 mg/dL (ref 8.4–10.5)
Chloride: 115 mmol/L — ABNORMAL HIGH (ref 96–112)
Creatinine, Ser: 1.07 mg/dL (ref 0.50–1.10)
GFR calc Af Amer: 54 mL/min — ABNORMAL LOW (ref >90)
GFR calc non Af Amer: 47 mL/min — ABNORMAL LOW (ref >90)
Glucose, Bld: 100 mg/dL — ABNORMAL HIGH (ref 70–99)
Potassium: 4.1 mmol/L (ref 3.5–5.1)
Sodium: 143 mmol/L (ref 135–145)

## 2014-08-07 LAB — PROTIME-INR
INR: 1.09 (ref 0.00–1.49)
Prothrombin Time: 14.2 seconds (ref 11.6–15.2)

## 2014-08-07 LAB — TROPONIN I
TROPONIN I: 0.37 ng/mL — AB (ref <0.031)
Troponin I: 0.5 ng/mL (ref <0.031)
Troponin I: 0.41 ng/mL — ABNORMAL HIGH (ref <0.031)

## 2014-08-07 LAB — HEPARIN LEVEL (UNFRACTIONATED)
HEPARIN UNFRACTIONATED: 0.74 [IU]/mL — AB (ref 0.30–0.70)
Heparin Unfractionated: <0.1 IU/mL — ABNORMAL LOW (ref 0.30–0.70)
Heparin Unfractionated: 0.65 IU/mL (ref 0.30–0.70)

## 2014-08-07 LAB — APTT: aPTT: 30 seconds (ref 24–37)

## 2014-08-07 MED ORDER — ALBUTEROL SULFATE (2.5 MG/3ML) 0.083% IN NEBU: 2.5000 mg | INHALATION_SOLUTION | RESPIRATORY_TRACT | Status: DC | PRN

## 2014-08-07 MED ORDER — ASPIRIN EC 325 MG PO TBEC
325.0000 mg | DELAYED_RELEASE_TABLET | Freq: Every day | ORAL | Status: DC
  Administered 2014-08-08 – 2014-08-09 (×2): 325 mg via ORAL
  Filled 2014-08-07 (×2): qty 1

## 2014-08-07 MED ORDER — HYPROMELLOSE (GONIOSCOPIC) 2.5 % OP SOLN
1.0000 [drp] | Freq: Four times a day (QID) | OPHTHALMIC | Status: DC | PRN
Start: 2014-08-07 — End: 2014-08-10
  Filled 2014-08-07 (×2): qty 15

## 2014-08-07 MED ORDER — ALBUTEROL SULFATE HFA 108 (90 BASE) MCG/ACT IN AERS: 2.0000 | INHALATION_SPRAY | RESPIRATORY_TRACT | Status: DC | PRN

## 2014-08-07 MED ORDER — HEPARIN (PORCINE) IN NACL 100-0.45 UNIT/ML-% IJ SOLN
800.0000 [IU]/h | INTRAMUSCULAR | Status: DC
Start: 2014-08-07 — End: 2014-08-09
  Administered 2014-08-07: 900 [IU]/h via INTRAVENOUS
  Administered 2014-08-08: 800 [IU]/h via INTRAVENOUS
  Filled 2014-08-07 (×4): qty 250

## 2014-08-07 MED ORDER — SODIUM CHLORIDE 0.9 % IJ SOLN
3.0000 mL | Freq: Two times a day (BID) | INTRAMUSCULAR | Status: DC
  Administered 2014-08-07 – 2014-08-09 (×4): 3 mL via INTRAVENOUS

## 2014-08-07 MED ORDER — HEPARIN BOLUS VIA INFUSION
4000.0000 [IU] | Freq: Once | INTRAVENOUS | Status: AC
  Administered 2014-08-07: 4000 [IU] via INTRAVENOUS
  Filled 2014-08-07: qty 4000

## 2014-08-07 MED ORDER — NITROGLYCERIN IN D5W 200-5 MCG/ML-% IV SOLN
0.0000 ug/min | Freq: Once | INTRAVENOUS | Status: AC
  Administered 2014-08-07: 5 ug/min via INTRAVENOUS
  Filled 2014-08-07: qty 250

## 2014-08-07 MED ORDER — PANTOPRAZOLE SODIUM 40 MG PO TBEC
40.0000 mg | DELAYED_RELEASE_TABLET | Freq: Every day | ORAL | Status: DC
  Administered 2014-08-07 – 2014-08-10 (×4): 40 mg via ORAL
  Filled 2014-08-07 (×4): qty 1

## 2014-08-07 MED ORDER — SODIUM CHLORIDE 0.9 % IJ SOLN: 3.0000 mL | Freq: Two times a day (BID) | INTRAMUSCULAR | Status: DC

## 2014-08-07 MED ORDER — NITROGLYCERIN IN D5W 200-5 MCG/ML-% IV SOLN: 0.0000 ug/min | Freq: Once | INTRAVENOUS | Status: DC

## 2014-08-07 MED ORDER — ASPIRIN 81 MG PO CHEW
324.0000 mg | CHEWABLE_TABLET | Freq: Once | ORAL | Status: AC
  Administered 2014-08-07: 324 mg via ORAL
  Filled 2014-08-07: qty 4

## 2014-08-07 MED ORDER — ACETAMINOPHEN 325 MG PO TABS
650.0000 mg | ORAL_TABLET | Freq: Four times a day (QID) | ORAL | Status: DC | PRN
  Administered 2014-08-09: 650 mg via ORAL
  Filled 2014-08-07 (×2): qty 2

## 2014-08-07 NOTE — Progress Notes (Signed)
ANTICOAGULATION CONSULT NOTE  Pharmacy Consult for Heparin Indication: chest pain/ACS  No Known Allergies  Patient Measurements: Height: 5\' 5"  (165.1 cm) Weight: 168 lb 3.4 oz (76.3 kg) IBW/kg (Calculated) : 57 Heparin Dosing Weight:   Vital Signs: Temp: 98.5 F (36.9 C) (02/07 2212) Temp Source: Oral (02/07 2212) BP: 138/55 mmHg (02/07 2212) Pulse Rate: 83 (02/07 2212)  Labs:  Recent Labs  08/06/14 0709  08/07/14 0501 08/07/14 0601 08/07/14 1347 08/07/14 1555 08/07/14 1825 08/07/14 2226  HGB 10.5*  --  10.1*  --   --   --   --   --   HCT 31.1*  --  30.5*  --   --   --   --   --   PLT 144*  --  158  --   --   --   --   --   APTT  --   --   --  30  --   --   --   --   LABPROT  --   --   --  14.2  --   --   --   --   INR  --   --   --  1.09  --   --   --   --   HEPARINUNFRC  --   --   --  <0.10*  --  0.65  --  0.74*  CREATININE 1.16*  --  1.07  --   --   --   --   --   TROPONINI <0.03  < > 0.50*  --  0.41*  --  0.37*  --   < > = values in this interval not displayed.  Estimated Creatinine Clearance: 40.7 mL/min (by C-G formula based on Cr of 1.07).  Assessment: 81 y.o. female with chest pain for heparin   Goal of Therapy:  Heparin level 0.3-0.7 units/ml Monitor platelets by anticoagulation protocol: Yes   Plan:  Decrease Heparin 800 units/hr Follow-up am labs.  Phillis Knack, PharmD, BCPS

## 2014-08-07 NOTE — Progress Notes (Addendum)
ANTICOAGULATION CONSULT NOTE - Initial Consult  Pharmacy Consult for Heparin Indication: chest pain/ACS  No Known Allergies  Patient Measurements:   Heparin Dosing Weight:   Vital Signs: Temp: 98.2 F (36.8 C) (02/07 0342) Temp Source: Oral (02/07 0342) BP: 155/69 mmHg (02/07 0342) Pulse Rate: 83 (02/07 0342)  Labs:  Recent Labs  08/06/14 0709 08/06/14 0936 08/07/14 0501  HGB 10.5*  --  10.1*  HCT 31.1*  --  30.5*  PLT 144*  --  158  CREATININE 1.16*  --  1.07  TROPONINI <0.03 0.03 0.50*    Estimated Creatinine Clearance: 40.6 mL/min (by C-G formula based on Cr of 1.07).   Medical History: Past Medical History  Diagnosis Date  . Anemia   . Anxiety   . Arthritis   . GERD (gastroesophageal reflux disease) 2009    EGD with benign gastric polyp too  . Hyperlipidemia   . Hypertension   . Vitamin D deficiency   . Colon polyp     2009 colonoscopy, not retrieved for pathology  . Hiatal hernia   . Diverticulosis 2009  . Aortic insufficiency     mild  . Stroke 1975  . CKD (chronic kidney disease) stage 3, GFR 30-59 ml/min   . CHF (congestive heart failure)     Medications:   (Not in a hospital admission) Infusions:  . heparin    . heparin      Assessment: Patient with CC: SOB.  (+) CE this AM in ED.  No oral anticoagulants noted on med rec.  Baseline coags ordered.  Goal of Therapy:  Heparin level 0.3-0.7 units/ml Monitor platelets by anticoagulation protocol: Yes   Plan:  Heparin bolus 4000 units iv x1 Heparin drip at  900 units/hr Daily CBC Next heparin level at  1500    Jane [name], Jane [name] 08/07/2014,5:58 AM  Addum:  Initial heparin level 0.65 units/ml.  Cont same and confirm level in 6 hrs Excell Seltzer, PharmD

## 2014-08-07 NOTE — Progress Notes (Signed)
Utilization review completed.  

## 2014-08-07 NOTE — Progress Notes (Signed)
Patient complains of being hungry and thirsty with a sore throat. Paged Dr. Sherrine Maples and she stated that until the cardiologist sees the patient she has to stay NPO. Will let patient know.

## 2014-08-07 NOTE — ED Notes (Signed)
At this time, as I write this, she is being loaded onto [facility] truck in no distress.  She is awake, alert and oriented x 4 and denies pain or shob at this time.  She continues to be in nsr without ectopy.

## 2014-08-07 NOTE — ED Provider Notes (Signed)
CSN: [id number]     Arrival date & time 08/07/14  5809 History   First MD Initiated Contact with Patient 08/07/14 [phone]     Chief Complaint  Patient presents with  . Shortness of Breath     (Consider location/radiation/quality/duration/timing/severity/associated sxs/prior Treatment) HPI   81yF with dyspnea. Has been ongoing for several weeks. Several recent evaluations and admit. Seen in ED yesterday also for dyspnea. Reassuring work-up and discharged. She report she was feeling well at time of discharge until symptoms returned again shortly after she got home. Feels SOB and burning sensation in upper chest/neck. Constant. No radiation. No appreciable exacerbating or relieving factors.  No nausea. No diaphoresis. No fever or chills. No cough. No unusual leg pain or swelling.   Past Medical History  Diagnosis Date  . Anemia   . Anxiety   . Arthritis   . GERD (gastroesophageal reflux disease) 2009    EGD with benign gastric polyp too  . Hyperlipidemia   . Hypertension   . Vitamin D deficiency   . Colon polyp     2009 colonoscopy, not retrieved for pathology  . Hiatal hernia   . Diverticulosis 2009  . Aortic insufficiency     mild  . Stroke 1975  . CKD (chronic kidney disease) stage 3, GFR 30-59 ml/min   . CHF (congestive heart failure)    Past Surgical History  Procedure Laterality Date  . Appendectomy    . Abdominal hysterectomy    . Artery biopsy Left 12/16/2012    Procedure: BIOPSY TEMPORAL ARTERY;  Surgeon: Mal Misty, MD;  [facility];  Service: Vascular;  Laterality: Left;   Family History  Problem Relation Age of Onset  . Stroke Mother   . Hypertension Mother   . Stroke Father   . Hypertension Father   . Diabetes Sister    History  Substance Use Topics  . Smoking status: Former Smoker    Types: Cigarettes    Quit date: 07/02/1979  . Smokeless tobacco: Former Systems developer  . Alcohol Use: No   OB History    No data available     Review of Systems  All  systems reviewed and negative, other than as noted in HPI.   Allergies  Review of patient's allergies indicates no known allergies.  Home Medications   Prior to Admission medications   Medication Sig Start Date End Date Taking? Authorizing Provider  acetaminophen (TYLENOL) 325 MG tablet Take 650 mg by mouth every 6 (six) hours as needed for mild pain.   Yes Historical Provider, MD  aspirin EC 81 MG tablet Take 81 mg by mouth daily.   Yes Historical Provider, MD  ferrous sulfate 325 (65 FE) MG tablet take 1 tablet by mouth twice a day 05/10/14  Yes Luan Moore, MD  furosemide (LASIX) 20 MG tablet Take 20 mg by mouth 2 (two) times daily.   Yes Historical Provider, MD  hydroxypropyl methylcellulose / hypromellose (ISOPTO TEARS / GONIOVISC) 2.5 % ophthalmic solution Place 1 drop into the left eye 4 (four) times daily as needed for dry eyes. 04/12/14  Yes Noland Fordyce, PA-C  losartan (COZAAR) 50 MG tablet Take 1.5 tablets (75 mg total) by mouth daily. 08/05/14  Yes Luan Moore, MD  metoprolol succinate (TOPROL-XL) 100 MG 24 hr tablet Take 1 tablet (100 mg total) by mouth daily. Take with or immediately following a meal. 06/02/14  Yes Luan Moore, MD  omeprazole (PRILOSEC) 20 MG capsule Take 1 capsule (20 mg total)  by mouth 2 (two) times daily before a meal. 04/12/14  Yes Noland Fordyce, PA-C  potassium chloride SA (K-DUR,KLOR-CON) 20 MEQ tablet take 1 tablet by mouth once daily 08/03/14  Yes Luan Moore, MD  albuterol (PROVENTIL HFA;VENTOLIN HFA) 108 (90 BASE) MCG/ACT inhaler Inhale 2 puffs into the lungs every 4 (four) hours as needed for wheezing or shortness of breath. 08/05/14   Luan Moore, MD   BP 155/69 mmHg  Pulse 83  Temp(Src) 98.2 F (36.8 C) (Oral)  Resp 20  SpO2 97% Physical Exam  Constitutional: She appears well-developed and well-nourished. No distress.  HENT:  Head: Normocephalic and atraumatic.  edentulous  Eyes: Conjunctivae are normal. Right eye exhibits no discharge. Left  eye exhibits no discharge.  Neck: Neck supple.  Cardiovascular: Normal rate, regular rhythm and normal heart sounds.  Exam reveals no gallop and no friction rub.   No murmur heard. Pulmonary/Chest: Effort normal and breath sounds normal. No respiratory distress.  Speaks in complete sentences  Abdominal: Soft. She exhibits no distension. There is no tenderness.  Musculoskeletal: She exhibits no edema or tenderness.  Chronic appearing skin changes to LE. No edema  Neurological: She is alert.  Skin: Skin is warm and dry.  Psychiatric: She has a normal mood and affect. Her behavior is normal. Thought content normal.  Nursing note and vitals reviewed.   ED Course  Procedures (including critical care time)  CRITICAL CARE Performed by: Virgel Manifold   Total critical care time: 30 minutes  Critical care time was exclusive of separately billable procedures and treating other patients. Critical care was necessary to treat or prevent imminent or life-threatening deterioration. Critical care was time spent personally by me on the following activities: development of treatment plan with patient and/or surrogate as well as nursing, discussions with consultants, evaluation of patient's response to treatment, examination of patient, obtaining history from patient or surrogate, ordering and performing treatments and interventions, ordering and review of laboratory studies, ordering and review of radiographic studies, pulse oximetry and re-evaluation of patient's condition.  Labs Review Labs Reviewed  CBC WITH DIFFERENTIAL/PLATELET - Abnormal; Notable for the following:    RBC 3.22 (*)    Hemoglobin 10.1 (*)    HCT 30.5 (*)    Neutrophils Relative % 82 (*)    Lymphocytes Relative 11 (*)    All other components within normal limits  BASIC METABOLIC PANEL - Abnormal; Notable for the following:    Chloride 115 (*)    Glucose, Bld 100 (*)    BUN 24 (*)    GFR calc non Af Amer 47 (*)    GFR calc  Af Amer 54 (*)    All other components within normal limits  TROPONIN I - Abnormal; Notable for the following:    Troponin I 0.50 (*)    All other components within normal limits  I-STAT TROPOININ, ED - Abnormal; Notable for the following:    Troponin i, poc 0.30 (*)    All other components within normal limits    Imaging Review Dg Chest 2 View  08/06/2014   CLINICAL DATA:  Shortness of breath with chest and back pain.  EXAM: CHEST  2 VIEW  COMPARISON:  07/29/2014  FINDINGS: Lungs are adequately inflated with minimal prominence of the perihilar markings compatible mild stable vascular congestion. No definite effusion. Flattening of the hemidiaphragms on the lateral film. Mild stable cardiomegaly. Calcified plaque is present over the thoracic aorta. There are degenerative changes of the spine. Evidence of  a mild compression deformity of the upper thoracic spine unchanged.  IMPRESSION: Mild cardiomegaly and suggestion of minimal vascular congestion unchanged.  Stable upper thoracic spine compression fracture.   Electronically Signed   By: Marin Olp M.D.   On: 08/06/2014 08:28   Ct Angio Chest Pe W/cm &/or Wo Cm  08/06/2014   CLINICAL DATA:  81 year old female with shortness of breath, nonproductive cough, tachycardia and pain between the shoulders and chest.  EXAM: CT ANGIOGRAPHY CHEST WITH CONTRAST  TECHNIQUE: Multidetector CT imaging of the chest was performed using the standard protocol during bolus administration of intravenous contrast. Multiplanar CT image reconstructions and MIPs were obtained to evaluate the vascular anatomy.  CONTRAST:  62mL OMNIPAQUE IOHEXOL 350 MG/ML SOLN  COMPARISON:  Chest x-ray obtained earlier today ; prior chest CT 06/12/2014  FINDINGS: Mediastinum: Unremarkable CT appearance of the thyroid gland. No suspicious mediastinal or hilar adenopathy. Small calcified right hilar nodes. No soft tissue mediastinal mass. Small hiatal hernia.  Heart/Vascular: Adequate  opacification of the pulmonary arteries to the proximal subsegmental level. No central filling defect to suggest acute pulmonary embolus. Main pulmonary artery within normal limits for size. Heart is at the upper limits of normal for size. Atherosclerotic calcifications noted in the left main, left anterior descending, circumflex and right coronary arteries. Normal caliber thoracic aorta with conventional 3 vessel arch anatomy.  Lungs/Pleura: No pleural effusion. Respiratory motion in the lower lobes limits evaluation for small pulmonary nodules. No evidence of pneumothorax or pulmonary edema. No focal airspace consolidation. Dependent atelectasis present in the bilateral lower lobes.  Bones/Soft Tissues: No acute fracture or aggressive appearing lytic or blastic osseous lesion.  Upper Abdomen: Visualized upper abdominal organs are unremarkable.  Review of the MIP images confirms the above findings.  IMPRESSION: 1. Negative for acute pulmonary embolus, pneumonia or other acute cardiopulmonary process. 2. 4 vessel coronary artery calcification. 3. Borderline cardiomegaly. 4. Small hiatal hernia. 5. Bilateral dependent lower lobe atelectasis.   Electronically Signed   By: Jacqulynn Cadet M.D.   On: 08/06/2014 11:47     EKG Interpretation None       EKG:  Rhythm: normal sinus Rate: 84 PR: 142 ms QRS: 87 ms QTc: 482 ms ST segments:    MDM   Final diagnoses:  NSTEMI (non-ST elevated myocardial infarction)  Dyspnea    85yF with dyspnea and burning in upper chest. Multiple recent evaluations for dyspnea of unclear etiology. Numerous negative troponins. ECHO showing mild LVH, normal EF, normal wall motion and mild aortic regurg. CT angio including one less than 24 hours ago negative for PE but did note coronary arthrosclerosis . She looks quite well. Speaks in complete sentences and in no apparent discomfort/distress.  Not greatest historian, but previous notes describe exertional dyspnea. Now  having symptoms at rest. Also atypical upper chest/neck burning which sounds more potentially GI. Has hx of GERD.  Istat troponin is elevated though. Lab troponin sent and elevated as well. EKG on this presentation with no overt ischemic changes. EKG from yesterday showing afib with RVR. Does not appear to have previously diagnosed afib. Needs admit. IM teaching service patient. Will discuss with them for continuity of care purposes as well and with cardiology.       Virgel Manifold, MD 08/07/14 [phone]

## 2014-08-07 NOTE — ED Notes (Signed)
Pt transported from home with c/o shob, pt treated for same at [facility] yesterday, d/c @ 1700. Pt states he did not get to speak with physician d/t her niece being with her so now she feels she can speak with MD. Pt speaking in full sentences, NAD. 98 on RA

## 2014-08-07 NOTE — H&P (Signed)
Date: 08/07/2014               Patient Name:  Jane [name] MRN: [id number]  DOB: 1928/10/22 Age / Sex: 81 y.o., female   PCP: Luan Moore, MD              Medical Service: Internal Medicine Teaching Service      Attending Physician: Dr. Thayer Headings, MD    First Contact: Lawana Chambers, MS3 Pager: [phone]  Second Contact: Dr. Sherrine Maples Pager: [phone]  Third Contact Dr. Redmond Pulling Pager: [phone]       After Hours (After 5p/  First Contact Pager: [phone]  weekends / holidays): Second Contact Pager: [phone]   Chief Complaint: SOB  History of Present Illness: Jane [name] is a 81 y.o. female with PMH significant for CHF (preserved EF), chronic dyspnea, CKD, HTN, anemia, GERD, and anxiety who presented to ED at [facility] for SOB and was subsequently transferred to our service. Pt states that she started experiencing SOB early this morning. She was resting lying in bed when it started and it is worse while ambulating but does not improve upon rest. She feels burning in her throat/upper chest w/out radiation and she does not specifically endorse CP. Has chronic numbess in her left fingers but she attributes that to her arthritis. She has orthopnea requiring two pillows to sleep at night. Denies palpitations, diaphoresis, fever, chills, abd pain, n/v/d. No leg pain or swelling. During exam she was not in discomfort and was able to speak in complete sentences w/out dyspnea.  Pt has an extensive hx for multiple visits to ED almost all for SOB. During those visits, her cardiac and PE workup has been negative and was told to f/u with her pcp for chronic dyspnea evaluation. Her most recent ED visit was yesterday (08/06/13) for SOB, patient had cardiac and CT PE workup which was negative. She was treated with albuterol and solumedrol in the ED. She was explained that orthopnea and dyspnea can be present chronically in patients with COPD and CHF. She was seen in the clinic two days ago  (08/05/14) by Dr. Raelene Bott.for dyspnea as well. At that time, Dr. Raelene Bott increased her losartan to 75mg  qd from 50mg  qd in effort to reduce her afterload. She was also started on albuterol inhaler trial for COPD during that clinic visit.  In the emergency department at [facility] this morning, she was given ASA 324mg . IStat elevated to 0.3; lab troponin elevated to 0.5; EKG showed no overt ischemic changes. Pt was transferred to IM service here for further workup.  Of note, yesterday on 08/06/14, her CXR showed minimal vascular congestion and CT Angio was negative for PE but showed coronary artery calcification, small hiatal hernia, and bilateral lowe lobe atelectasis  Meds: Current Facility-Administered Medications  Medication Dose Route Frequency Provider Last Rate Last Dose  . acetaminophen (TYLENOL) tablet 650 mg  650 mg Oral Q6H PRN Francesca Oman, DO      . albuterol (PROVENTIL) (2.5 MG/3ML) 0.083% nebulizer solution 2.5 mg  2.5 mg Nebulization Q4H PRN Thayer Headings, MD      . Derrill Memo ON 08/08/2014] aspirin EC tablet 325 mg  325 mg Oral Daily Francesca Oman, DO      . heparin ADULT infusion 100 units/mL (25000 units/250 mL)  900 Units/hr Intravenous Continuous Virgel Manifold, MD 9 mL/hr at 08/07/14 0604 900 Units/hr at 08/07/14 0604  . hydroxypropyl methylcellulose / hypromellose (ISOPTO TEARS / GONIOVISC)  2.5 % ophthalmic solution 1 drop  1 drop Left Eye QID PRN Francesca Oman, DO      . pantoprazole (PROTONIX) EC tablet 40 mg  40 mg Oral Daily Francesca Oman, DO   40 mg at 08/07/14 1605  . sodium chloride 0.9 % injection 3 mL  3 mL Intravenous Q12H Francesca Oman, DO      . sodium chloride 0.9 % injection 3 mL  3 mL Intravenous Q12H Francesca Oman, DO   3 mL at 08/07/14 1606    Allergies: Allergies as of 08/07/2014  . (No Known Allergies)   Past Medical History  Diagnosis Date  . Anemia   . Anxiety   . Arthritis   . GERD (gastroesophageal reflux disease) 2009    EGD with benign gastric  polyp too  . Hyperlipidemia   . Hypertension   . Vitamin D deficiency   . Colon polyp     2009 colonoscopy, not retrieved for pathology  . Hiatal hernia   . Diverticulosis 2009  . Aortic insufficiency     mild  . Stroke 1975  . CKD (chronic kidney disease) stage 3, GFR 30-59 ml/min   . CHF (congestive heart failure)    Past Surgical History  Procedure Laterality Date  . Appendectomy    . Abdominal hysterectomy    . Artery biopsy Left 12/16/2012    Procedure: BIOPSY TEMPORAL ARTERY;  Surgeon: Mal Misty, MD;  Location: [facility] OR;  Service: Vascular;  Laterality: Left;    Family History: Family History  Problem Relation Age of Onset  . Stroke Mother   . Hypertension Mother   . Stroke Father   . Hypertension Father   . Diabetes Sister     Social History; History   Social History  . Marital Status: Widowed    Spouse Name: N/A    Number of Children: N/A  . Years of Education: N/A   Occupational History  . Not on file.   Social History Main Topics  . Smoking status: Former Smoker    Types: Cigarettes    Quit date: 07/02/1979  . Smokeless tobacco: Former Systems developer  . Alcohol Use: No  . Drug Use: No  . Sexual Activity: No   Other Topics Concern  . Not on file   Social History Narrative    Review of Systems: Pertinent items are noted in HPI. All other pertinent ROS as stated in HPI.   Physical Exam: Blood pressure 146/69, pulse 89, temperature 98.3 F (36.8 C), temperature source Oral, resp. rate 18, height 5\' 5"  (1.651 m), weight 76.3 kg (168 lb 3.4 oz), SpO2 100 %. Constitutional: Vital signs reviewed.  Patient lying in bed in no acute distress and cooperative with exam.  Mouth: no erythema or exudates, MMM Eyes: PERRL, conjunctivae normal, left lower eyelid ectropion Neck: Supple, Trachea midline normal ROM, No JVD Cardiovascular: RRR, S1 normal, S2 normal, no MRG Pulmonary/Chest: normal respiratory effort, CTAB, no wheezes or rales Abdominal: Soft.  Non-tender, non-distended, bowel sounds are normal, no masses, organomegaly, or guarding present. Extremities; No edema; bilateral hyperpigmentation in LE (L>R) Neurological: A&O x3 Skin: Warm, dry and intact. Bilateral overgrown toenails. Psychiatric: Normal mood and affect.  Lab results: Basic Metabolic Panel:  Recent Labs Lab 08/06/14 0709 08/07/14 0501  NA 140 143  K 3.2* 4.1  CL 110 115*  CO2 21 22  GLUCOSE 115* 100*  BUN 19 24*  CREATININE 1.16* 1.07  CALCIUM 9.1 9.4   Liver  Function Tests:  Recent Labs Lab 08/06/14 0709  AST 21  ALT 11  ALKPHOS 77  BILITOT 0.8  PROT 7.2  ALBUMIN 3.8   CBC:  Recent Labs Lab 08/06/14 0709 08/07/14 0501  WBC 3.9* 7.5  NEUTROABS 1.7 6.2  HGB 10.5* 10.1*  HCT 31.1* 30.5*  MCV 92.0 94.7  PLT 144* 158   Cardiac Enzymes:  Recent Labs Lab 08/06/14 0936 08/07/14 0501 08/07/14 1347  TROPONINI 0.03 0.50* 0.41*   Coagulation:  Recent Labs Lab 08/07/14 0601  LABPROT 14.2  INR 1.09   Imaging results:  Dg Chest 2 View  08/06/2014   CLINICAL DATA:  Shortness of breath with chest and back pain.  EXAM: CHEST  2 VIEW  COMPARISON:  07/29/2014  FINDINGS: Lungs are adequately inflated with minimal prominence of the perihilar markings compatible mild stable vascular congestion. No definite effusion. Flattening of the hemidiaphragms on the lateral film. Mild stable cardiomegaly. Calcified plaque is present over the thoracic aorta. There are degenerative changes of the spine. Evidence of a mild compression deformity of the upper thoracic spine unchanged.  IMPRESSION: Mild cardiomegaly and suggestion of minimal vascular congestion unchanged.  Stable upper thoracic spine compression fracture.   Electronically Signed   By: Marin Olp M.D.   On: 08/06/2014 08:28   Ct Angio Chest Pe W/cm &/or Wo Cm  08/06/2014   CLINICAL DATA:  81 year old female with shortness of breath, nonproductive cough, tachycardia and pain between the shoulders  and chest.  EXAM: CT ANGIOGRAPHY CHEST WITH CONTRAST  TECHNIQUE: Multidetector CT imaging of the chest was performed using the standard protocol during bolus administration of intravenous contrast. Multiplanar CT image reconstructions and MIPs were obtained to evaluate the vascular anatomy.  CONTRAST:  61mL OMNIPAQUE IOHEXOL 350 MG/ML SOLN  COMPARISON:  Chest x-ray obtained earlier today ; prior chest CT 06/12/2014  FINDINGS: Mediastinum: Unremarkable CT appearance of the thyroid gland. No suspicious mediastinal or hilar adenopathy. Small calcified right hilar nodes. No soft tissue mediastinal mass. Small hiatal hernia.  Heart/Vascular: Adequate opacification of the pulmonary arteries to the proximal subsegmental level. No central filling defect to suggest acute pulmonary embolus. Main pulmonary artery within normal limits for size. Heart is at the upper limits of normal for size. Atherosclerotic calcifications noted in the left main, left anterior descending, circumflex and right coronary arteries. Normal caliber thoracic aorta with conventional 3 vessel arch anatomy.  Lungs/Pleura: No pleural effusion. Respiratory motion in the lower lobes limits evaluation for small pulmonary nodules. No evidence of pneumothorax or pulmonary edema. No focal airspace consolidation. Dependent atelectasis present in the bilateral lower lobes.  Bones/Soft Tissues: No acute fracture or aggressive appearing lytic or blastic osseous lesion.  Upper Abdomen: Visualized upper abdominal organs are unremarkable.  Review of the MIP images confirms the above findings.  IMPRESSION: 1. Negative for acute pulmonary embolus, pneumonia or other acute cardiopulmonary process. 2. 4 vessel coronary artery calcification. 3. Borderline cardiomegaly. 4. Small hiatal hernia. 5. Bilateral dependent lower lobe atelectasis.   Electronically Signed   By: Jacqulynn Cadet M.D.   On: 08/06/2014 11:47    Other results: EKG: 84 bpm; NSR  Assessment & Plan  by Problem: Active Problems:   NSTEMI (non-ST elevated myocardial infarction)   Jnya LADIAMOND GALLINA is a 81 y.o. female with PMH significant for CHF (preserved EF), chronic dyspnea, HTN, CKD, anemia, GERD, and anxiety who [facility] and was subsequently transferred to our service.  **NSTEMI - Pt's sx  of SOB at rest along with IStat Troponin of 0.3, troponin of 0.50 followed by 0.41 without ischemic changes on EKG suggest most likely NSTEMI. Pt has a hx of GERD, COPD, and anxiety. The burning in her throat is potentially from GERD and her COPD hx could also be contributing to her worsening SOB. Currently, pt's vitals are stable and given the negative chest CT Angio from yesterday, PE is less likely to be the cause of her SOB. - Trend Troponin labs - EKG in AM - Cardiology consult - NPO for now until cardiology determines the need for any acute procedures - Heparin drip - ASA 81mg  - Continue home metoprolol succinate 100mg  qd - Continue home losartan 75mg  qd - Risk stratification (TSH, Lipid panel, Hgb A1c)  **CHF - Pt has a baseline orthopnea requiring two pillows at night. She appears euvolemic today. Her sx are less likely to be from acute CHF exacerbation but more likely from CAD. Last 2D Echo on 06/01/14 showed mild LVH with EF of 60%, no wall motion abnormalities, mild aortic regurg. Currently satting at 100% on RA. - ASA 81mg  - Continue home metoprolol succinate 100mg  qd - Continue home losartan 75mg  qd - Conitnue home lasix 20mg  BID  **HTN - BP elevated this morning into 150-170s/70s. Will continue home regimen - Monitor vitals q6h - Continue home metoprolol succinate 100mg  qd - Continue home losartan 75mg  qd - Conitnue home lasix 20mg  BID  **CKD - SCr at 1.07; GFR of 54. Pt is stable  - Will monitor BMPs - Renal U/S  **Anemia - Hgb 10.1; HCT 30.5; MCV 94.7 which seems to be around her baseline. Normocytic anemia but on iron supplements. Pt denies any overt  bleeding or epistaxis. Pt is hemodynamically stable. - Monitor CBC - Hold home ferrous sulfate 325mg  for now - Will order anemia labs if necessary  **GERD - Her throat burning sx could be potentially GI related - Continue home protonix 40mg  qd  **COPD - former smoker, her chronic dyspnea could be baseline due to lung disease. CXR was negative. - Albuterol inhaler 2 puffs prn  **Left eyelid ectropion - Continue home Isopto tears 2.5% QID  **Anxiety - Pt endorses hx of anxiety and panic attacks, but currently not on any medications. Will continue to monitor her for sx.  Diet: NPO for possible Cardiac procedures; Heart healthy afterwards Prophylaxis: Heparin Code: Full  Dispo: Disposition is deferred at this time, awaiting improvement of current medical problems. Anticipated discharge in approximately 2-3 day(s).   The patient does have a current PCP Luan Moore, MD) and does need an [facility] hospital follow-up appointment after discharge.  The patient does not have transportation limitations that hinder transportation to clinic appointments.  This is a Careers information officer Note.  The care of the patient was discussed with Dr. Sherrine Maples and the assessment and plan was formulated with their assistance.  Please see their note for official documentation of the patient encounter.   Signed: Lawana Chambers, Med Student Internal Medicine Teaching Service Team B2  [phone] 08/07/2014, 11:41 AM

## 2014-08-07 NOTE — ED Notes (Signed)
Pt is c/o SOB however is talking in full sentences, O2 sat is 99% on RA and breathing is not labored.  Will continue to monitor.

## 2014-08-07 NOTE — H&P (Signed)
Date: 08/07/2014               Patient Name:  Jane [name] MRN: [id number]  DOB: 07/29/1928 Age / Sex: 81 y.o., female   PCP: Luan Moore, MD         Medical Service: Internal Medicine Teaching Service         Attending Physician: Dr. Thayer Headings, MD    First Contact: Dr. Venita Lick Pager: [phone]  Second Contact: Dr. Duwaine Maxin Pager: [phone]       After Hours (After 5p/  First Contact Pager: [phone]  weekends / holidays): Second Contact Pager: [phone]   Chief Complaint: shortness of breath at rest and chest pain since early this morning  History of Present Illness: Jane [name] is an 81 yo woman with a history of chronic dyspnea, HFpEF, GERD, hypertension, anxiety, anemia and mild dementia who presented to the [facility] ED with dyspnea early this morning. She typically visits the ED frequently with the same complaint, including a visit on 08/06/14 (the day prior to admission), but today, she had noticed shortness of breath at rest. She also noted a burning sensation in her throat with no radiation and some right shoulder pain. She has chronic left hand and arm numbness, but she denies nausea, vomiting, diaphoresis or palpitations.  At her ED visit on the day prior to admission, her shortness of breath was found to be exertional and her delta troponin was negative.  Home Meds: No current facility-administered medications on file prior to encounter.   Current Outpatient Prescriptions on File Prior to Encounter  Medication Sig Dispense Refill  . acetaminophen (TYLENOL) 325 MG tablet Take 650 mg by mouth every 6 (six) hours as needed for mild pain.    Marland Kitchen aspirin EC 81 MG tablet Take 81 mg by mouth daily.    . ferrous sulfate 325 (65 FE) MG tablet take 1 tablet by mouth twice a day 60 tablet 3  . furosemide (LASIX) 20 MG tablet Take 20 mg by mouth 2 (two) times daily.    . hydroxypropyl methylcellulose / hypromellose (ISOPTO TEARS / GONIOVISC) 2.5 % ophthalmic solution Place 1 drop  into the left eye 4 (four) times daily as needed for dry eyes. 15 mL 2  . losartan (COZAAR) 50 MG tablet Take 1.5 tablets (75 mg total) by mouth daily. 90 tablet 1  . metoprolol succinate (TOPROL-XL) 100 MG 24 hr tablet Take 1 tablet (100 mg total) by mouth daily. Take with or immediately following a meal. 60 tablet 0  . omeprazole (PRILOSEC) 20 MG capsule Take 1 capsule (20 mg total) by mouth 2 (two) times daily before a meal. 30 capsule 0  . potassium chloride SA (K-DUR,KLOR-CON) 20 MEQ tablet take 1 tablet by mouth once daily 90 tablet 3  . albuterol (PROVENTIL HFA;VENTOLIN HFA) 108 (90 BASE) MCG/ACT inhaler Inhale 2 puffs into the lungs every 4 (four) hours as needed for wheezing or shortness of breath. 1 Inhaler 3    Allergies: Allergies as of 08/07/2014  . (No Known Allergies)   Past Medical History  Diagnosis Date  . Anemia   . Anxiety   . Arthritis   . GERD (gastroesophageal reflux disease) 2009    EGD with benign gastric polyp too  . Hyperlipidemia   . Hypertension   . Vitamin D deficiency   . Colon polyp     2009 colonoscopy, not retrieved for pathology  . Hiatal hernia   . Diverticulosis 2009  .  Aortic insufficiency     mild  . Stroke 1975  . CKD (chronic kidney disease) stage 3, GFR 30-59 ml/min   . CHF (congestive heart failure)    Past Surgical History  Procedure Laterality Date  . Appendectomy    . Abdominal hysterectomy    . Artery biopsy Left 12/16/2012    Procedure: BIOPSY TEMPORAL ARTERY;  Surgeon: Mal Misty, MD;  Location: [facility] OR;  Service: Vascular;  Laterality: Left;   Family History  Problem Relation Age of Onset  . Stroke Mother   . Hypertension Mother   . Stroke Father   . Hypertension Father   . Diabetes Sister    History   Social History  . Marital Status: Widowed    Spouse Name: N/A    Number of Children: N/A  . Years of Education: N/A   Occupational History  . Not on file.   Social History Main Topics  . Smoking status: Former  Smoker    Types: Cigarettes    Quit date: 07/02/1979  . Smokeless tobacco: Former Systems developer  . Alcohol Use: No  . Drug Use: No  . Sexual Activity: No   Other Topics Concern  . Not on file   Social History Narrative    Review of Systems: General: recently in the ED (yesterday) for shortness of breath Skin: no rashes, lesions on head and right leg HEENT: + recent headache, no blurred vision, + tearing R>L eye Cardiac: no chest pain, no palpitations Respiratory: + shortness of breath at rest and when walking GI: no changes in BMs, no abdominal pain Urinary: no change in urination Msk: no pain Psychiatric: admits to being lonely, sister lives very near, but does not come by very often  Physical Exam: Blood pressure 119/63, pulse 87, temperature 98.1 F (36.7 C), temperature source Oral, resp. rate 16, height 5\' 5"  (1.651 m), weight 168 lb 3.4 oz (76.3 kg), SpO2 100 %. Appearance: in NAD, slightly disheveled, bonnet on head HEENT: AT/[place], ectropion OS (with sliver of corneal exposure when patient rests eyes), tearing OU, PERRL, EOMi, no lymphadenopathy Heart: RRR, normal S1S2, no MRG Lungs: CTAB, no wheezes Abdomen: BS+, soft, nontender, no organomegaly Musculoskeletal: no joint swelling Extremities: extensive chronic venous change in R>L lower extremity Neurologic: A&Ox3, some difficulty following questions, grossly intact Skin: SKs on top of head Physical: appears anxious during conversation  Lab results: Basic Metabolic Panel:  Recent Labs  08/06/14 0709 08/07/14 0501  NA 140 143  K 3.2* 4.1  CL 110 115*  CO2 21 22  GLUCOSE 115* 100*  BUN 19 24*  CREATININE 1.16* 1.07  CALCIUM 9.1 9.4   Liver Function Tests:  Recent Labs  08/06/14 0709  AST 21  ALT 11  ALKPHOS 77  BILITOT 0.8  PROT 7.2  ALBUMIN 3.8   CBC:  Recent Labs  08/06/14 0709 08/07/14 0501  WBC 3.9* 7.5  NEUTROABS 1.7 6.2  HGB 10.5* 10.1*  HCT 31.1* 30.5*  MCV 92.0 94.7  PLT 144* 158    Cardiac Enzymes:  Recent Labs  08/06/14 0709 08/06/14 0936 08/07/14 0501  TROPONINI <0.03 0.03 0.50*   Coagulation:  Recent Labs  08/07/14 0601  LABPROT 14.2  INR 1.09   Imaging results:  Dg Chest 2 View  08/06/2014   CLINICAL DATA:  Shortness of breath with chest and back pain.  EXAM: CHEST  2 VIEW  COMPARISON:  07/29/2014  FINDINGS: Lungs are adequately inflated with minimal prominence of the perihilar markings compatible  mild stable vascular congestion. No definite effusion. Flattening of the hemidiaphragms on the lateral film. Mild stable cardiomegaly. Calcified plaque is present over the thoracic aorta. There are degenerative changes of the spine. Evidence of a mild compression deformity of the upper thoracic spine unchanged.  IMPRESSION: Mild cardiomegaly and suggestion of minimal vascular congestion unchanged.  Stable upper thoracic spine compression fracture.   Electronically Signed   By: Marin Olp M.D.   On: 08/06/2014 08:28   Ct Angio Chest Pe W/cm &/or Wo Cm  08/06/2014   CLINICAL DATA:  81 year old female with shortness of breath, nonproductive cough, tachycardia and pain between the shoulders and chest.  EXAM: CT ANGIOGRAPHY CHEST WITH CONTRAST  TECHNIQUE: Multidetector CT imaging of the chest was performed using the standard protocol during bolus administration of intravenous contrast. Multiplanar CT image reconstructions and MIPs were obtained to evaluate the vascular anatomy.  CONTRAST:  46mL OMNIPAQUE IOHEXOL 350 MG/ML SOLN  COMPARISON:  Chest x-ray obtained earlier today ; prior chest CT 06/12/2014  FINDINGS: Mediastinum: Unremarkable CT appearance of the thyroid gland. No suspicious mediastinal or hilar adenopathy. Small calcified right hilar nodes. No soft tissue mediastinal mass. Small hiatal hernia.  Heart/Vascular: Adequate opacification of the pulmonary arteries to the proximal subsegmental level. No central filling defect to suggest acute pulmonary embolus. Main  pulmonary artery within normal limits for size. Heart is at the upper limits of normal for size. Atherosclerotic calcifications noted in the left main, left anterior descending, circumflex and right coronary arteries. Normal caliber thoracic aorta with conventional 3 vessel arch anatomy.  Lungs/Pleura: No pleural effusion. Respiratory motion in the lower lobes limits evaluation for small pulmonary nodules. No evidence of pneumothorax or pulmonary edema. No focal airspace consolidation. Dependent atelectasis present in the bilateral lower lobes.  Bones/Soft Tissues: No acute fracture or aggressive appearing lytic or blastic osseous lesion.  Upper Abdomen: Visualized upper abdominal organs are unremarkable.  Review of the MIP images confirms the above findings.  IMPRESSION: 1. Negative for acute pulmonary embolus, pneumonia or other acute cardiopulmonary process. 2. 4 vessel coronary artery calcification. 3. Borderline cardiomegaly. 4. Small hiatal hernia. 5. Bilateral dependent lower lobe atelectasis.   Electronically Signed   By: Jacqulynn Cadet M.D.   On: 08/06/2014 11:47    Other results: EKG: rate 84, NSR, diffuse ST depressions, long QTc 509  Assessment & Plan by Problem: Active Problems:   NSTEMI (non-ST elevated myocardial infarction)  Jane [name] is an 81 yo woman with a history of chronic dsypnea, HFpEF, GERD, hypertension and anemia who presented with chest pain, shortness of breath at rest and has been found to have an NSTEMI with a troponin elevated to 0.50.  NSTEMI: Troponins have always been negative in this patient; however today, her admission troponin I was 0.50-->0.40. Diffuse ST depressions on EKG. Aspirin was given in the ED. Patient satting 100% on room air. - Heparin drip - Appreciate cardiology following - Continue home tylenol 325 mg q6hours PRN for pain; resume once taking PO  Acute on Chronic Dyspnea: Likely related to her NSTEMI. Patient frequently reports dyspnea,  but it has never occurred at rest. However, she has been evaluated in the ED multiple times for dyspnea; also had an admission in 05/2014 for similar symptoms. Multiple workups thus far have made pulmonary embolism, decompensated heart failure and arrhythmia less likely. Very possible that some of her exacerbations are related to anxiety. Additionally, this could be secondary to deconditioning as well as her baseline heart failure.  On home albuterol. Not currently dyspneic on room air. - Continue to observe  HFpEF: Most recent echo 05/2014 showed EF 60% with LVH.  GERD: History of GERD with current burning symptoms in the upper chest. On home omeprazole 20 mg by mouth BID prior to meals. - Continue home medications (pantoprazole 40 mg daily) once taking PO  Hypertension: Currently 146/69. Patient was unsure whether she took her medications this morning. Will resume once patient is taking PO. - Resume home metoprolol 100 mg daily tomorrow  - Resume home losartan 50 mg daily tomorrow  Anemia: Hemoglobin currently 10.1. History of iron deficiency; on oral supplementation. However, her anemia is currently normocytic. It is possible that her iron deficiency is exacerbating her dyspnea.  - Will resume with iron supplementation 325 mg BID once taking PO  Anxiety: Patient often expresses her anxiety, particularly as it relates to being alone at home. Not on any outpatient medications.  History of CVA: In 1975; few details. On home aspirin 81 mg daily, losartan 50 mg daily and metoprolol 100 mg daily at home.  Mild Dementia: Patient is not currently on any medications for this.   Ectropion OS: Patient's eye does not fully close at rest. Increased tearing OU. - Continue home Isopto tears for dry eye  Diet: NPO prior to cardiology evaluation  DVT Ppx: heparin infusion   Dispo: Disposition is deferred at this time, awaiting improvement of current medical problems. Anticipated discharge in  approximately 3 day(s).   The patient does have a current PCP Luan Moore, MD) and does need an [facility] hospital follow-up appointment after discharge.  The patient does not know have transportation limitations that hinder transportation to clinic appointments.  Signed: Drucilla Schmidt, MD 08/07/2014, 11:25 AM

## 2014-08-07 NOTE — ED Notes (Signed)
Per Dr. Wilson Singer nitro gtt does not need to be increased any further as his primary reason for ordering gtt was sx control vs obtaining SBP between 100-110.  Pt reports improvement in SOB, and decrease in epigastric burning sensation.

## 2014-08-07 NOTE — ED Notes (Signed)
MD at bedside. 

## 2014-08-07 NOTE — Consult Note (Signed)
Reason for Consult:chest pain and sob  Referring Physician: Dr. Berneta [name] is an 81 y.o. female.   HPI: Jane [name] is referred today by Dr. Linus Salmons for evaluation of sob, in the setting of an elevated troponin. The patient has a long history of shortness of breath and has been followed on multiple occasions in our pulmonary clinic. She has chronic renal insufficiency. She has hypertension and dyslipidemia. Review of her records demonstrates multiple visits for shortness of breath. She has preserved left ventricular function, although she certainly has diastolic dysfunction. Earlier today, she had worsening shortness of breath and pressure in the chest, mostly under the left breast. Evaluation of her cardiac enzymes demonstrated a slightly elevated troponin at 0.5. Her shortness of breath is improved. She had no acute changes on her ECG. She is referred for additional evaluation. She has not had syncope. She denies peripheral edema. She has minimal in the way of cough or hemoptysis. She has a history of tobacco abuse but stopped smoking 35 years ago. I find it difficult to obtain a history from the patient. She often gets off track and wants to talk more about her toenails. A CT scan of her chest demonstrated coronary calcification.  PMH: Past Medical History  Diagnosis Date  . Anemia   . Anxiety   . Arthritis   . GERD (gastroesophageal reflux disease) 2009    EGD with benign gastric polyp too  . Hyperlipidemia   . Hypertension   . Vitamin D deficiency   . Colon polyp     2009 colonoscopy, not retrieved for pathology  . Hiatal hernia   . Diverticulosis 2009  . Aortic insufficiency     mild  . Stroke 1975  . CKD (chronic kidney disease) stage 3, GFR 30-59 ml/min   . CHF (congestive heart failure)     PSHX: Past Surgical History  Procedure Laterality Date  . Appendectomy    . Abdominal hysterectomy    . Artery biopsy Left 12/16/2012    Procedure: BIOPSY TEMPORAL  ARTERY;  Surgeon: Mal Misty, MD;  Location: [facility] OR;  Service: Vascular;  Laterality: Left;    FAMHX: Family History  Problem Relation Age of Onset  . Stroke Mother   . Hypertension Mother   . Stroke Father   . Hypertension Father   . Diabetes Sister     Social History:  reports that she quit smoking about 35 years ago. Her smoking use included Cigarettes. She has quit using smokeless tobacco. She reports that she does not drink alcohol or use illicit drugs.  Allergies: No Known Allergies  Medications: Reviewed  Dg Chest 2 View  08/06/2014   CLINICAL DATA:  Shortness of breath with chest and back pain.  EXAM: CHEST  2 VIEW  COMPARISON:  07/29/2014  FINDINGS: Lungs are adequately inflated with minimal prominence of the perihilar markings compatible mild stable vascular congestion. No definite effusion. Flattening of the hemidiaphragms on the lateral film. Mild stable cardiomegaly. Calcified plaque is present over the thoracic aorta. There are degenerative changes of the spine. Evidence of a mild compression deformity of the upper thoracic spine unchanged.  IMPRESSION: Mild cardiomegaly and suggestion of minimal vascular congestion unchanged.  Stable upper thoracic spine compression fracture.   Electronically Signed   By: Marin Olp M.D.   On: 08/06/2014 08:28   Ct Angio Chest Pe W/cm &/or Wo Cm  08/06/2014   CLINICAL DATA:  81 year old female with shortness of breath, nonproductive cough,  tachycardia and pain between the shoulders and chest.  EXAM: CT ANGIOGRAPHY CHEST WITH CONTRAST  TECHNIQUE: Multidetector CT imaging of the chest was performed using the standard protocol during bolus administration of intravenous contrast. Multiplanar CT image reconstructions and MIPs were obtained to evaluate the vascular anatomy.  CONTRAST:  83mL OMNIPAQUE IOHEXOL 350 MG/ML SOLN  COMPARISON:  Chest x-ray obtained earlier today ; prior chest CT 06/12/2014  FINDINGS: Mediastinum: Unremarkable CT  appearance of the thyroid gland. No suspicious mediastinal or hilar adenopathy. Small calcified right hilar nodes. No soft tissue mediastinal mass. Small hiatal hernia.  Heart/Vascular: Adequate opacification of the pulmonary arteries to the proximal subsegmental level. No central filling defect to suggest acute pulmonary embolus. Main pulmonary artery within normal limits for size. Heart is at the upper limits of normal for size. Atherosclerotic calcifications noted in the left main, left anterior descending, circumflex and right coronary arteries. Normal caliber thoracic aorta with conventional 3 vessel arch anatomy.  Lungs/Pleura: No pleural effusion. Respiratory motion in the lower lobes limits evaluation for small pulmonary nodules. No evidence of pneumothorax or pulmonary edema. No focal airspace consolidation. Dependent atelectasis present in the bilateral lower lobes.  Bones/Soft Tissues: No acute fracture or aggressive appearing lytic or blastic osseous lesion.  Upper Abdomen: Visualized upper abdominal organs are unremarkable.  Review of the MIP images confirms the above findings.  IMPRESSION: 1. Negative for acute pulmonary embolus, pneumonia or other acute cardiopulmonary process. 2. 4 vessel coronary artery calcification. 3. Borderline cardiomegaly. 4. Small hiatal hernia. 5. Bilateral dependent lower lobe atelectasis.   Electronically Signed   By: Jacqulynn Cadet M.D.   On: 08/06/2014 11:47    ROS  As stated in the HPI and negative for all other systems.  Physical Exam  Vitals:Blood pressure 146/69, pulse 89, temperature 98.3 F (36.8 C), temperature source Oral, resp. rate 18, height 5\' 5"  (1.651 m), weight 168 lb 3.4 oz (76.3 kg), SpO2 100 %.  Well appearing elderly woman, NAD HEENT: Unremarkable Neck:   J 7 cmVD, no thyromegally Lymphatics:  No adenopathy Back:  No CVA tenderness Lungs:  Clear, except for rales in the bases.  There are no wheezes or rhonchi. There is no increased  work of breathing.  HEART:  Regular rate rhythm, no murmurs, no rubs, no clicks Abd:  soft, positive bowel sounds, no organomegally, no rebound, no guarding Ext:  2 plus pulses, no edema, no cyanosis, no clubbing Skin:  No rashes no nodules Neuro:  CN II through XII intact, motor grossly intact  ECG - normal sinus rhythm with nonspecific ST-T wave abnormality  Labs - reviewed  Chest x-ray - reviewed  Assessment/Plan: 1. Chronic shortness of breath worse today etiology uncertain 2. Atypical chest pressure with slightly elevated cardiac enzymes 3. Dementia Discussion: The patient may well have coronary disease based on her CT scan and slightly elevated cardiac markers. With her multiple comorbidities, she is not a good candidate for invasive evaluation. I would recommend undergoing stress testing. If she is high risk, then cardiac catheterization would be a consideration. Adding long-acting nitrates and controlling her blood pressure will also be important. We'll keep the patient nothing by mouth for stress testing tomorrow.  Carleene Overlie TaylorMD 08/07/2014, 5:28 PM

## 2014-08-08 ENCOUNTER — Encounter (HOSPITAL_COMMUNITY): Payer: Medicare Other

## 2014-08-08 ENCOUNTER — Inpatient Hospital Stay (HOSPITAL_COMMUNITY): Payer: Medicare Other

## 2014-08-08 ENCOUNTER — Other Ambulatory Visit: Payer: Self-pay

## 2014-08-08 DIAGNOSIS — R079 Chest pain, unspecified: Secondary | ICD-10-CM

## 2014-08-08 DIAGNOSIS — F0390 Unspecified dementia without behavioral disturbance: Secondary | ICD-10-CM

## 2014-08-08 DIAGNOSIS — Z87891 Personal history of nicotine dependence: Secondary | ICD-10-CM

## 2014-08-08 DIAGNOSIS — Z8673 Personal history of transient ischemic attack (TIA), and cerebral infarction without residual deficits: Secondary | ICD-10-CM

## 2014-08-08 DIAGNOSIS — H02109 Unspecified ectropion of unspecified eye, unspecified eyelid: Secondary | ICD-10-CM

## 2014-08-08 DIAGNOSIS — K219 Gastro-esophageal reflux disease without esophagitis: Secondary | ICD-10-CM

## 2014-08-08 DIAGNOSIS — F419 Anxiety disorder, unspecified: Secondary | ICD-10-CM

## 2014-08-08 DIAGNOSIS — I214 Non-ST elevation (NSTEMI) myocardial infarction: Principal | ICD-10-CM

## 2014-08-08 DIAGNOSIS — R7989 Other specified abnormal findings of blood chemistry: Secondary | ICD-10-CM

## 2014-08-08 DIAGNOSIS — Z7982 Long term (current) use of aspirin: Secondary | ICD-10-CM

## 2014-08-08 DIAGNOSIS — R0609 Other forms of dyspnea: Secondary | ICD-10-CM

## 2014-08-08 DIAGNOSIS — I10 Essential (primary) hypertension: Secondary | ICD-10-CM

## 2014-08-08 LAB — BASIC METABOLIC PANEL
Anion gap: 8 (ref 5–15)
BUN: 19 mg/dL (ref 6–23)
CHLORIDE: 111 mmol/L (ref 96–112)
CO2: 20 mmol/L (ref 19–32)
Calcium: 8.6 mg/dL (ref 8.4–10.5)
Creatinine, Ser: 1.03 mg/dL (ref 0.50–1.10)
GFR calc non Af Amer: 49 mL/min — ABNORMAL LOW (ref >90)
GFR, EST AFRICAN AMERICAN: 57 mL/min — AB (ref >90)
GLUCOSE: 111 mg/dL — AB (ref 70–99)
Potassium: 3.6 mmol/L (ref 3.5–5.1)
Sodium: 139 mmol/L (ref 135–145)

## 2014-08-08 LAB — CBC
HEMATOCRIT: 28.7 % — AB (ref 36.0–46.0)
Hemoglobin: 9.5 g/dL — ABNORMAL LOW (ref 12.0–15.0)
MCH: 30.9 pg (ref 26.0–34.0)
MCHC: 33.1 g/dL (ref 30.0–36.0)
MCV: 93.5 fL (ref 78.0–100.0)
Platelets: 131 10*3/uL — ABNORMAL LOW (ref 150–400)
RBC: 3.07 MIL/uL — ABNORMAL LOW (ref 3.87–5.11)
RDW: 13.7 % (ref 11.5–15.5)
WBC: 7.1 10*3/uL (ref 4.0–10.5)

## 2014-08-08 LAB — HEPARIN LEVEL (UNFRACTIONATED): Heparin Unfractionated: 0.68 IU/mL (ref 0.30–0.70)

## 2014-08-08 LAB — TROPONIN I: Troponin I: 0.31 ng/mL — ABNORMAL HIGH (ref <0.031)

## 2014-08-08 MED ORDER — BISOPROLOL FUMARATE 5 MG PO TABS
5.0000 mg | ORAL_TABLET | Freq: Every day | ORAL | Status: DC
  Administered 2014-08-08 – 2014-08-10 (×3): 5 mg via ORAL
  Filled 2014-08-08 (×3): qty 1

## 2014-08-08 MED ORDER — REGADENOSON 0.4 MG/5ML IV SOLN
0.4000 mg | Freq: Once | INTRAVENOUS | Status: AC
  Administered 2014-08-08: 0.4 mg via INTRAVENOUS
  Filled 2014-08-08: qty 5

## 2014-08-08 MED ORDER — TECHNETIUM TC 99M SESTAMIBI GENERIC - CARDIOLITE
10.0000 | Freq: Once | INTRAVENOUS | Status: AC | PRN
  Administered 2014-08-08: 10 via INTRAVENOUS

## 2014-08-08 MED ORDER — REGADENOSON 0.4 MG/5ML IV SOLN
INTRAVENOUS | Status: AC
  Filled 2014-08-08: qty 5

## 2014-08-08 MED ORDER — TECHNETIUM TC 99M SESTAMIBI - CARDIOLITE
30.0000 | Freq: Once | INTRAVENOUS | Status: AC | PRN
  Administered 2014-08-08: 30 via INTRAVENOUS

## 2014-08-08 MED ORDER — ISOSORBIDE MONONITRATE ER 30 MG PO TB24
30.0000 mg | ORAL_TABLET | Freq: Every day | ORAL | Status: DC
  Administered 2014-08-08 – 2014-08-10 (×3): 30 mg via ORAL
  Filled 2014-08-08 (×3): qty 1

## 2014-08-08 MED ORDER — LOSARTAN POTASSIUM 50 MG PO TABS
50.0000 mg | ORAL_TABLET | Freq: Every day | ORAL | Status: DC
  Administered 2014-08-08 – 2014-08-09 (×2): 50 mg via ORAL
  Filled 2014-08-08 (×3): qty 1
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
## 2014-08-08 NOTE — Progress Notes (Signed)
TELEMETRY: Reviewed telemetry pt in NSR with PACs and few PVCs: Filed Vitals:   08/07/14 0921 08/07/14 1100 08/07/14 2212 08/08/14 0346  BP:  146/69 138/55 164/63  Pulse:  89 83 88  Temp:  98.3 F (36.8 C) 98.5 F (36.9 C) 98.5 F (36.9 C)  TempSrc:  Oral Oral Oral  Resp:  18 18 17   Height: 5\' 5"  (1.651 m)   5\' 5"  (1.651 m)  Weight: 168 lb 3.4 oz (76.3 kg)   166 lb 0.1 oz (75.3 kg)  SpO2:  100% 97% 98%    Intake/Output Summary (Last 24 hours) at 08/08/14 0858 Last data filed at 08/08/14 0805  Gross per 24 hour  Intake    240 ml  Output    427 ml  Net   -187 ml   Filed Weights   08/07/14 0921 08/08/14 0346  Weight: 168 lb 3.4 oz (76.3 kg) 166 lb 0.1 oz (75.3 kg)    Subjective Denies any chest pain or SOB. NPO for stress test today.  Marland Kitchen aspirin EC  325 mg Oral Daily  . losartan  50 mg Oral Daily  . pantoprazole  40 mg Oral Daily  . sodium chloride  3 mL Intravenous Q12H  . sodium chloride  3 mL Intravenous Q12H   . heparin 800 Units/hr (08/08/14 0343)    LABS: Basic Metabolic Panel:  Recent Labs  08/07/14 0501 08/08/14 0045  NA 143 139  K 4.1 3.6  CL 115* 111  CO2 22 20  GLUCOSE 100* 111*  BUN 24* 19  CREATININE 1.07 1.03  CALCIUM 9.4 8.6   Liver Function Tests:  Recent Labs  08/06/14 0709  AST 21  ALT 11  ALKPHOS 77  BILITOT 0.8  PROT 7.2  ALBUMIN 3.8   No results for input(s): LIPASE, AMYLASE in the last 72 hours. CBC:  Recent Labs  08/06/14 0709 08/07/14 0501 08/08/14 0045  WBC 3.9* 7.5 7.1  NEUTROABS 1.7 6.2  --   HGB 10.5* 10.1* 9.5*  HCT 31.1* 30.5* 28.7*  MCV 92.0 94.7 93.5  PLT 144* 158 131*   Cardiac Enzymes:  Recent Labs  08/07/14 1347 08/07/14 1825 08/08/14 0045  TROPONINI 0.41* 0.37* 0.31*   BNP: No results for input(s): PROBNP in the last 72 hours. D-Dimer: No results for input(s): DDIMER in the last 72 hours. Hemoglobin A1C: No results for input(s): HGBA1C in the last 72 hours. Fasting Lipid Panel: No  results for input(s): CHOL, HDL, LDLCALC, TRIG, CHOLHDL, LDLDIRECT in the last 72 hours. Thyroid Function Tests: No results for input(s): TSH, T4TOTAL, T3FREE, THYROIDAB in the last 72 hours.  Invalid input(s): FREET3   Radiology/Studies:  Ct Angio Chest Pe W/cm &/or Wo Cm  08/06/2014   CLINICAL DATA:  81 year old female with shortness of breath, nonproductive cough, tachycardia and pain between the shoulders and chest.  EXAM: CT ANGIOGRAPHY CHEST WITH CONTRAST  TECHNIQUE: Multidetector CT imaging of the chest was performed using the standard protocol during bolus administration of intravenous contrast. Multiplanar CT image reconstructions and MIPs were obtained to evaluate the vascular anatomy.  CONTRAST:  36mL OMNIPAQUE IOHEXOL 350 MG/ML SOLN  COMPARISON:  Chest x-ray obtained earlier today ; prior chest CT 06/12/2014  FINDINGS: Mediastinum: Unremarkable CT appearance of the thyroid gland. No suspicious mediastinal or hilar adenopathy. Small calcified right hilar nodes. No soft tissue mediastinal mass. Small hiatal hernia.  Heart/Vascular: Adequate opacification of the pulmonary arteries to the proximal subsegmental level. No central filling defect to suggest  acute pulmonary embolus. Main pulmonary artery within normal limits for size. Heart is at the upper limits of normal for size. Atherosclerotic calcifications noted in the left main, left anterior descending, circumflex and right coronary arteries. Normal caliber thoracic aorta with conventional 3 vessel arch anatomy.  Lungs/Pleura: No pleural effusion. Respiratory motion in the lower lobes limits evaluation for small pulmonary nodules. No evidence of pneumothorax or pulmonary edema. No focal airspace consolidation. Dependent atelectasis present in the bilateral lower lobes.  Bones/Soft Tissues: No acute fracture or aggressive appearing lytic or blastic osseous lesion.  Upper Abdomen: Visualized upper abdominal organs are unremarkable.  Review of the  MIP images confirms the above findings.  IMPRESSION: 1. Negative for acute pulmonary embolus, pneumonia or other acute cardiopulmonary process. 2. 4 vessel coronary artery calcification. 3. Borderline cardiomegaly. 4. Small hiatal hernia. 5. Bilateral dependent lower lobe atelectasis.   Electronically Signed   By: Jacqulynn Cadet M.D.   On: 08/06/2014 11:47   Portable Chest 1 View  08/07/2014   CLINICAL DATA:  Initial evaluation for chest pain with burning sensation in throat  EXAM: PORTABLE CHEST - 1 VIEW  COMPARISON:  08/06/2014  FINDINGS: Mild cardiac enlargement. Stable aortic calcification. Vascular pattern normal. No consolidation or effusion.  IMPRESSION: No active disease.   Electronically Signed   By: Skipper Cliche M.D.   On: 08/07/2014 17:30   Ecg 2.7/16: NSR with LAE. Otherwise normal.   Echo:Transthoracic Echocardiography  Patient:  Aleshia, Cartelli MR #:    [id number] Study Date: 06/01/2014 Gender:   F Age:    34 Height:   160 cm Weight:   75.8 kg BSA:    1.86 m^2 Pt. Status: Room:    [address]  Madilyn Fireman [address] [place], [place] [id number] [employer] [place], Inpatient  cc:  ------------------------------------------------------------------- LV EF: 60%  ------------------------------------------------------------------- Indications:   Dyspnea 786.09.  ------------------------------------------------------------------- History:  PMH:  Stroke. Risk factors: Hypertension. Dyslipidemia.  ------------------------------------------------------------------- Study Conclusions  - Left ventricle: The cavity size was normal. Wall thickness was increased in a pattern of mild LVH. The estimated ejection fraction was 60%. Wall motion was normal; there were no regional wall motion abnormalities. - Aortic  valve: There was mild regurgitation. - Right ventricle: The cavity size was normal. Systolic function was normal. - Pulmonary arteries: PA peak pressure: 29 mm Hg (S). - Pericardium, extracardiac: A trivial pericardial effusion was identified posterior to the heart.  Transthoracic echocardiography. M-mode, complete 2D, spectral Doppler, and color Doppler. Birthdate: Patient birthdate: 1929/02/16. Age: Patient is 81 yr old. Sex: Gender: female. BMI: 29.6 kg/m^2. Blood pressure:   123/50 Patient status: Inpatient. Study date: Study date: 06/01/2014. Study time: 08:18 AM. Location: Bedside.  -------------------------------------------------------------------  ------------------------------------------------------------------- Left ventricle: The cavity size was normal. Wall thickness was increased in a pattern of mild LVH. The estimated ejection fraction was 60%. Wall motion was normal; there were no regional wall motion abnormalities.  ------------------------------------------------------------------- Aortic valve:  Structurally normal valve.  Cusp separation was normal. Doppler: Transvalvular velocity was within the normal range. There was no stenosis. There was mild regurgitation.  ------------------------------------------------------------------- Aorta: Aortic root: The aortic root was normal in size.  ------------------------------------------------------------------- Mitral valve:  Mildly thickened leaflets . Doppler: There was trivial regurgitation.  Peak gradient (D): 4 mm Hg.  ------------------------------------------------------------------- Left atrium: The atrium was at the upper limits of normal in size.  ------------------------------------------------------------------- Right ventricle: The cavity size  was normal. Systolic function was normal.  ------------------------------------------------------------------- Pulmonic valve:   The valve appears to be grossly normal. Doppler: There was no significant regurgitation.  ------------------------------------------------------------------- Tricuspid valve:  The valve appears to be grossly normal. Doppler: There was mild regurgitation.  ------------------------------------------------------------------- Right atrium: The atrium was at the upper limits of normal in size.  ------------------------------------------------------------------- Pericardium: A trivial pericardial effusion was identified posterior to the heart.  ------------------------------------------------------------------- Measurements  Left ventricle              Value    Reference LV ID, ED, PLAX chordal     (L)   41.9 mm   43 - 52 LV ID, ES, PLAX chordal         26.9 mm   23 - 38 LV fx shortening, PLAX chordal      36  %   >=29 LV PW thickness, ED           10.7 mm   --------- IVS/LV PW ratio, ED           1.06     <=1.3 LV e&', lateral              6.14 cm/s  --------- LV E/e&', lateral             16.94    --------- LV e&', medial              5.7  cm/s  --------- LV E/e&', medial             18.25    --------- LV e&', average              5.92 cm/s  --------- LV E/e&', average             17.57    ---------  Ventricular septum            Value    Reference IVS thickness, ED            11.3 mm   ---------  Aortic valve               Value    Reference Aortic regurg pressure half-time     449  ms   ---------  Aorta                  Value    Reference Aortic root ID, ED            25  mm   ---------  Left atrium               Value    Reference LA ID, A-P, ES               38  mm   --------- LA ID/bsa, A-P              2.04 cm/m^2 <=2.2 LA volume, S               84  ml   --------- LA volume/bsa, S             45.2 ml/m^2 --------- LA volume, ES, 1-p A4C          79  ml   --------- LA volume/bsa, ES, 1-p A4C        42.5 ml/m^2 --------- LA volume, ES, 1-p A2C          80  ml   --------- LA volume/bsa, ES, 1-p A2C  43  ml/m^2 ---------  Mitral valve               Value    Reference Mitral E-wave peak velocity       104  cm/s  --------- Mitral A-wave peak velocity       126  cm/s  --------- Mitral deceleration time     (H)   296  ms   150 - 230 Mitral peak gradient, D         4   mm Hg --------- Mitral E/A ratio, peak          0.8     ---------  Pulmonary arteries            Value    Reference PA pressure, S, DP            29  mm Hg <=30  Tricuspid valve             Value    Reference Tricuspid regurg peak velocity      254  cm/s  --------- Tricuspid peak RV-RA gradient      26  mm Hg ---------  Systemic veins              Value    Reference Estimated CVP              3   mm Hg ---------  Right ventricle             Value    Reference RV pressure, S, DP            29  mm Hg <=30 RV s&', lateral, S            13.8 cm/s  ---------  Legend: (L) and (H) mark values outside specified reference range.  ------------------------------------------------------------------- Prepared and Electronically Authenticated by  Dola Argyle, MD 2015-12-02T10:29:12 PHYSICAL EXAM General: Well developed, elderly female, in no acute distress. Head: Normocephalic, atraumatic, sclera non-icteric, oropharynx is clear Neck: Negative  for carotid bruits. JVD 6 cm. No adenopathy Lungs: Clear bilaterally to auscultation without wheezes, rales, or rhonchi. Breathing is unlabored. Heart: RRR S1 S2 without murmurs, rubs, or gallops.  Abdomen: Soft, non-tender, non-distended with normoactive bowel sounds. No hepatomegaly. No rebound/guarding. No obvious abdominal masses. Msk:  Strength and tone appears normal for age. Extremities: No clubbing, cyanosis or edema.  Distal pedal pulses are 2+ and equal bilaterally. Neuro: Alert and oriented X 3. Moves all extremities spontaneously. Psych:  Responds to questions appropriately with a normal affect.  ASSESSMENT AND PLAN: 1. Acute on chronic dyspnea. CT of chest negative except for atelectasis.  Normal LV function by Echo in December. ? Anginal equivalent.  2. Atypical chest pain with positive troponins. 3. Dementia 4. HTN 5. Anemia 6. Hyperlipidemia  Plan Lexiscan myoview today. If low risk I would favor conservative medical therapy. Will add bisoprolol for BP and potential anginal control. On long acting nitrates. If Myoview is high risk would need to consider cardiac cath.   Present on Admission:  . NSTEMI (non-ST elevated myocardial infarction) . Anemia . Chronic venous insufficiency b/l lower extremities . Ectropion of left lower eyelid . Essential hypertension . GERD  Signed, Taylie Helder [place], [place] 08/08/2014 8:58 AM

## 2014-08-08 NOTE — Evaluation (Signed)
Physical Therapy Evaluation Patient Details Name: Jane [name] MRN: [id number] DOB: [birth year] Today's Date: 08/08/2014   History of Present Illness  Jane [name] is an 81 yo woman with a history of chronic dsypnea, HFpEF, GERD, hypertension and anemia who presented with chest pain, shortness of breath at rest and has been found to have an NSTEMI with a troponin elevated to 0.50.  Clinical Impression  Pt admitted with above diagnosis. Pt currently with functional limitations due to the deficits listed below (see PT Problem List).  Pt will benefit from skilled PT to increase their independence and safety with mobility to allow discharge to the venue listed below.       Follow Up Recommendations SNF;Supervision/Assistance - 24 hour    Equipment Recommendations  Rolling walker with 5" wheels;3in1 (PT)    Recommendations for Other Services       Precautions / Restrictions Precautions Precautions: Fall      Mobility  Bed Mobility                  Transfers Overall transfer level: Needs assistance Equipment used: Rolling walker (2 wheeled) Transfers: Sit to/from Stand Sit to Stand: Mod assist         General transfer comment: Light mod assist to power-up; Patient with LOB and used required BUE support for safe transfer and transition  Ambulation/Gait Ambulation/Gait assistance: Min assist;Mod assist Ambulation Distance (Feet): 85 Feet Assistive device: Rolling walker (2 wheeled);None Gait Pattern/deviations: Decreased stride length;Trunk flexed ("I always look at the floor") Gait velocity: decr   General Gait Details: Started walk without assistive device, but pt with significant Loss of Balance posterior Right side, requiring mod assist to prevent fall  Stairs            Wheelchair Mobility    Modified Rankin (Stroke Patients Only)       Balance     Sitting balance-Leahy Scale: Fair       Standing balance-Leahy Scale: Poor                                Pertinent Vitals/Pain Pain Assessment: No/denies pain    Home Living Family/patient expects to be discharged to:: Private residence Living Arrangements: Alone Available Help at Discharge: Family;Available PRN/intermittently;Personal care attendant (pt reports PCA cones about 2x per week) Type of Home: Apartment Home Access: Elevator     Home Layout: One level Home Equipment: Cane - single point;Walker - 2 wheels;Shower seat Additional Comments: does own laundry, housework, cooking per patient. Does not drive.    Prior Function Level of Independence: Independent;Independent with assistive device(s);Needs assistance   Gait / Transfers Assistance Needed: "Furniture walks" and uses RW for community ambulation.  ADL's / Homemaking Assistance Needed: Has an aide come in 2 days/week to assist with IADLs. Reports (I) for ADLs. Uses shower chair for bathing. Cooks.        Hand Dominance   Dominant Hand: Right    Extremity/Trunk Assessment   Upper Extremity Assessment: Defer to OT evaluation           Lower Extremity Assessment: Generalized weakness (Heavy reliance on UEs for sit<>stand)         Communication   Communication: No difficulties  Cognition Arousal/Alertness: Awake/alert Behavior During Therapy: WFL for tasks assessed/performed Overall Cognitive Status: Within Functional Limits for tasks assessed  General Comments      Exercises        Assessment/Plan    PT Assessment Patient needs continued PT services  PT Diagnosis Difficulty walking   PT Problem List Decreased strength;Decreased activity tolerance;Decreased balance;Decreased mobility;Decreased knowledge of use of DME;Decreased safety awareness;Decreased knowledge of precautions  PT Treatment Interventions DME instruction;Gait training;Functional mobility training;Therapeutic activities;Therapeutic exercise;Patient/family education   PT  Goals (Current goals can be found in the Care Plan section) Acute Rehab PT Goals Patient Stated Goal: go home PT Goal Formulation: With patient Time For Goal Achievement: 08/22/14 Potential to Achieve Goals: Good Additional Goals Additional Goal #1: pt will score greater than or equal to 49/56 to demonstrate decr risk of falls    Frequency Min 3X/week   Barriers to discharge Decreased caregiver support      Co-evaluation               End of Session Equipment Utilized During Treatment: Gait belt Activity Tolerance: Patient tolerated treatment well Patient left: in chair;with call bell/phone within reach Nurse Communication: Mobility status         Time: 6195-0932 PT Time Calculation (min) (ACUTE ONLY): 25 min   Charges:   PT Evaluation $Initial PT Evaluation Tier I: 1 Procedure PT Treatments $Gait Training: 8-22 mins   PT G Codes:        Jane [name] 08/08/2014, 3:59 PM  Jane [name], [place] Pager [phone] Office [phone]

## 2014-08-08 NOTE — Progress Notes (Signed)
Why for Heparin Indication: chest pain/ACS  No Known Allergies  Patient Measurements: Height: 5\' 5"  (165.1 cm) Weight: 166 lb 0.1 oz (75.3 kg) IBW/kg (Calculated) : 57 Heparin Dosing Weight:   Vital Signs: Temp: 98.5 F (36.9 C) (02/08 0346) Temp Source: Oral (02/08 0346) BP: 164/63 mmHg (02/08 0346) Pulse Rate: 88 (02/08 0346)  Labs:  Recent Labs  08/06/14 0709  08/07/14 0501  08/07/14 0601 08/07/14 1347 08/07/14 1555 08/07/14 1825 08/07/14 2226 08/08/14 0045  HGB 10.5*  --  10.1*  --   --   --   --   --   --  9.5*  HCT 31.1*  --  30.5*  --   --   --   --   --   --  28.7*  PLT 144*  --  158  --   --   --   --   --   --  131*  APTT  --   --   --   --  30  --   --   --   --   --   LABPROT  --   --   --   --  14.2  --   --   --   --   --   INR  --   --   --   --  1.09  --   --   --   --   --   HEPARINUNFRC  --   --   --   < > <0.10*  --  0.65  --  0.74* 0.68  CREATININE 1.16*  --  1.07  --   --   --   --   --   --  1.03  TROPONINI <0.03  < > 0.50*  --   --  0.41*  --  0.37*  --  0.31*  < > = values in this interval not displayed.  Estimated Creatinine Clearance: 42 mL/min (by C-G formula based on Cr of 1.03).  Assessment: 81 year old female continues on heparin for chest pain.  Heparin level therapeutic this AM No bleeding noted, CBC trend down but stable  Goal of Therapy:  Heparin level 0.3-0.7 units/ml Monitor platelets by anticoagulation protocol: Yes   Plan:  Continue heparin at 800 units / hr Follow up AM labs  Thank you. Anette Guarneri, PharmD [phone]

## 2014-08-08 NOTE — Progress Notes (Signed)
Lexiscan completed. BP elevated at baseline. Patient tolerated fair. With injection HR crept up to the 130s, sinus tach. EKG did demonstrate TWI II, V2-V6 with flat ST depression in I, II, V4-V6 (up to 66mm max in V4-V5). She c/o throat burning, SOB, and feeling like she had to urinate. She was observed for 12 minutes with very slow improvement of EKG. HR gradually coming down as symptoms resolved. Await images but based on EKG I would not be surprised if this was an abnormal study. Dayna Dunn PA-C

## 2014-08-08 NOTE — Progress Notes (Signed)
Subjective: No acute events overnight. Pt continues to experience dyspnea and it is worse while ambulating to use the bathroom. Denies CP or abd pain. Pt has been NPO since midnight for possible stress test today. She had few questions regarding her stress test and we addressed her concerns and questions.  Objective: Vital signs in last 24 hours: Filed Vitals:   08/07/14 0921 08/07/14 1100 08/07/14 2212 08/08/14 0346  BP:  146/69 138/55 164/63  Pulse:  89 83 88  Temp:  98.3 F (36.8 C) 98.5 F (36.9 C) 98.5 F (36.9 C)  TempSrc:  Oral Oral Oral  Resp:  18 18 17   Height: 5\' 5"  (1.651 m)   5\' 5"  (1.651 m)  Weight: 76.3 kg (168 lb 3.4 oz)   75.3 kg (166 lb 0.1 oz)  SpO2:  100% 97% 98%    Intake/Output Summary (Last 24 hours) at 08/08/14 0951 Last data filed at 08/08/14 0805  Gross per 24 hour  Intake    240 ml  Output    427 ml  Net   -187 ml   Physical Exam: Constitutional: Vital signs reviewed. Patient lying in bed in no acute distress and cooperative with exam.  Mouth: no erythema or exudates, MMM Eyes: PERRL, conjunctivae normal, left lower eyelid ectropion Neck: Supple, Trachea midline normal ROM, No JVD Cardiovascular: RRR, S1 normal, S2 normal, no MRG Pulmonary/Chest: normal respiratory effort, CTAB, no wheezes or rales Abdominal: Soft. Non-tender, non-distended, bowel sounds are normal, no masses, organomegaly, or guarding present. Extremities; No edema; bilateral hyperpigmentation in LE (L>R) Neurological: A&O x3 Skin: Warm, dry and intact. Bilateral overgrown toenails. Psychiatric: Normal mood and affect.  Lab Results: Basic Metabolic Panel:  Recent Labs Lab 08/07/14 0501 08/08/14 0045  NA 143 139  K 4.1 3.6  CL 115* 111  CO2 22 20  GLUCOSE 100* 111*  BUN 24* 19  CREATININE 1.07 1.03  CALCIUM 9.4 8.6   Liver Function Tests:  Recent Labs Lab 08/06/14 0709  AST 21  ALT 11  ALKPHOS 77  BILITOT 0.8  PROT 7.2  ALBUMIN 3.8   CBC:  Recent  Labs Lab 08/06/14 0709 08/07/14 0501 08/08/14 0045  WBC 3.9* 7.5 7.1  NEUTROABS 1.7 6.2  --   HGB 10.5* 10.1* 9.5*  HCT 31.1* 30.5* 28.7*  MCV 92.0 94.7 93.5  PLT 144* 158 131*   Cardiac Enzymes:  Recent Labs Lab 08/07/14 1347 08/07/14 1825 08/08/14 0045  TROPONINI 0.41* 0.37* 0.31*    Recent Labs Lab 08/07/14 0601  LABPROT 14.2  INR 1.09   Studies/Results: Ct Angio Chest Pe W/cm &/or Wo Cm  08/06/2014   CLINICAL DATA:  81 year old female with shortness of breath, nonproductive cough, tachycardia and pain between the shoulders and chest.  EXAM: CT ANGIOGRAPHY CHEST WITH CONTRAST  TECHNIQUE: Multidetector CT imaging of the chest was performed using the standard protocol during bolus administration of intravenous contrast. Multiplanar CT image reconstructions and MIPs were obtained to evaluate the vascular anatomy.  CONTRAST:  48mL OMNIPAQUE IOHEXOL 350 MG/ML SOLN  COMPARISON:  Chest x-ray obtained earlier today ; prior chest CT 06/12/2014  FINDINGS: Mediastinum: Unremarkable CT appearance of the thyroid gland. No suspicious mediastinal or hilar adenopathy. Small calcified right hilar nodes. No soft tissue mediastinal mass. Small hiatal hernia.  Heart/Vascular: Adequate opacification of the pulmonary arteries to the proximal subsegmental level. No central filling defect to suggest acute pulmonary embolus. Main pulmonary artery within normal limits for size. Heart is at the upper limits  of normal for size. Atherosclerotic calcifications noted in the left main, left anterior descending, circumflex and right coronary arteries. Normal caliber thoracic aorta with conventional 3 vessel arch anatomy.  Lungs/Pleura: No pleural effusion. Respiratory motion in the lower lobes limits evaluation for small pulmonary nodules. No evidence of pneumothorax or pulmonary edema. No focal airspace consolidation. Dependent atelectasis present in the bilateral lower lobes.  Bones/Soft Tissues: No acute fracture  or aggressive appearing lytic or blastic osseous lesion.  Upper Abdomen: Visualized upper abdominal organs are unremarkable.  Review of the MIP images confirms the above findings.  IMPRESSION: 1. Negative for acute pulmonary embolus, pneumonia or other acute cardiopulmonary process. 2. 4 vessel coronary artery calcification. 3. Borderline cardiomegaly. 4. Small hiatal hernia. 5. Bilateral dependent lower lobe atelectasis.   Electronically Signed   By: Jacqulynn Cadet M.D.   On: 08/06/2014 11:47   Portable Chest 1 View  08/07/2014   CLINICAL DATA:  Initial evaluation for chest pain with burning sensation in throat  EXAM: PORTABLE CHEST - 1 VIEW  COMPARISON:  08/06/2014  FINDINGS: Mild cardiac enlargement. Stable aortic calcification. Vascular pattern normal. No consolidation or effusion.  IMPRESSION: No active disease.   Electronically Signed   By: Skipper Cliche M.D.   On: 08/07/2014 17:30   Medications:  Scheduled Meds: . aspirin EC  325 mg Oral Daily  . bisoprolol  5 mg Oral Daily  . isosorbide mononitrate  30 mg Oral Daily  . losartan  50 mg Oral Daily  . pantoprazole  40 mg Oral Daily  . regadenoson  0.4 mg Intravenous Once  . sodium chloride  3 mL Intravenous Q12H  . sodium chloride  3 mL Intravenous Q12H   Continuous Infusions: . heparin 800 Units/hr (08/08/14 0343)   PRN Meds:.acetaminophen, albuterol, hydroxypropyl methylcellulose / hypromellose  Assessment/Plan: Active Problems:   Anemia   Essential hypertension   GERD   Chronic venous insufficiency b/l lower extremities   Ectropion of left lower eyelid   Heart failure with preserved ejection fraction   NSTEMI (non-ST elevated myocardial infarction)  Jane [name] is a 81 y.o. female with PMH significant for CHF (preserved EF), chronic dyspnea, HTN, CKD, anemia, GERD, and anxiety who presented to ED at [facility] for SOB, found to have NSTEMI and was subsequently transferred to our service.  **NSTEMI - Pt's sx of SOB at  rest along with IStat Troponin of 0.3, serial down trending troponins of 0.50, 0.41, 0.37, and 0.31 without ischemic changes on EKG suggest most likely NSTEMI. Pt has hx of GERD, COPD, and anxiety. The burning in her throat could potentially be from GERD and her COPD hx could also be contributing to her worsening SOB. Currently, pt's vitals are stable and given the recent negative chest CT Angio, PE is less likely to be the cause of her SOB. Holding home metoprolol until NST completed, however Cardiology team thinks it is appropriate to initiate bisoprolol 5mg  qd for now. Will reconcile with pt and cardiology team on the preference of BB upon discharge. - Continuous telemetry - NPO since midnight for stress test today - Cardiology consulted, will appreciate recs - Heparin drip - ASA 81mg  qd - Isosorbide mononitrate 30mg  qd - Hold home metoprolol succinate 100mg  qd and start bisoprolol 5mg  qd - Continue home losartan 75mg  qd - Risk stratification (TSH, Lipid panel, Hgb A1c)  **CHF - Pt has a baseline orthopnea requiring two pillows at night. She appears euvolemic today. Her sx are less likely to be  from acute CHF exacerbation but more likely from CAD. Last 2D Echo on 06/01/14 showed mild LVH with EF of 60%, no wall motion abnormalities, mild aortic regurg. Currently satting at 98% on RA. - ASA 81mg  - Continue home losartan 75mg  qd - Bisoprolol 5mg  qd as above - Hold home lasix 20mg  BID until the stress test is completed  **HTN - BP elevated this morning into 150-170s/70s. Will continue home regimen - Monitor vitals q6h - Continue home losartan 75mg  qd - Bisoprolol 5mg  qd as above - Hold home lasix 20mg  BID as above  **CKD - SCr at 1.03; GFR of 57. Pt is stable  - Will monitor sx and BMP  **Anemia - Hgb 10.1 - > 9.5; HCT 30.5 -> 28.7; MCV 94.7 -> 93.5 which seems to be around her baseline. Normocytic anemia but on iron supplements. Pt denies any overt bleeding or epistaxis. Pt is  hemodynamically stable. - Monitor CBC for Hgb and HCT trends - Hold home ferrous sulfate 325mg  for now - Will order anemia labs if necessary  **GERD - Her throat burning sx could be potentially GI related - Continue home protonix 40mg  qd  **COPD - former smoker, her chronic dyspnea could be baseline due to lung disease. CXR was negative. - Albuterol inhaler 2 puffs prn  **Left eyelid ectropion - Continue home Isopto tears 2.5% QID  **Anxiety - Pt endorses hx of anxiety and panic attacks, but currently not on any medications. Will continue to monitor her for sx.  Diet: NPO for possible Cardiac procedures today; Heart healthy afterwards Prophylaxis: Heparin Code: Full  Dispo: Disposition is deferred at this time, awaiting improvement of current medical problems. Anticipated discharge in approximately 2-3 day(s).    The patient does have a current PCP Luan Moore, MD) and does need an [facility] hospital follow-up appointment after discharge.  The patient does have transportation limitations that hinder transportation to clinic appointments.   Services Needed at time of discharge: Y = Yes, Blank = No PT:    OT:   RN:   Equipment:   Other:      LOS: 1 day   This is a Careers information officer Note. The care of the patient was discussed with Dr. Redmond Pulling and the assessment and plan was formulated with their assistance. Please see their note for official documentation of the patient encounter.  Signed: Lawana Chambers, Med Student Internal Medicine Teaching Service Team B2  [phone] 08/08/2014, 9:45 AM

## 2014-08-08 NOTE — Progress Notes (Signed)
Patient Profile: 81 y/o female with chronic renal insufficiency, HTN and dyslipidemia admitted for chest pain and dyspnea. C  Subjective:  Currently CP free. She still gets short of breath on exertion.    Objective: Vital signs in last 24 hours: Temp:  [98.1 F (36.7 C)-98.5 F (36.9 C)] 98.5 F (36.9 C) (02/08 0346) Pulse Rate:  [77-89] 88 (02/08 0346) Resp:  [16-18] 17 (02/08 0346) BP: (119-164)/(52-69) 164/63 mmHg (02/08 0346) SpO2:  [97 %-100 %] 98 % (02/08 0346) Weight:  [166 lb 0.1 oz (75.3 kg)-168 lb 3.4 oz (76.3 kg)] 166 lb 0.1 oz (75.3 kg) (02/08 0346) Last BM Date: 08/05/14  Intake/Output from previous day: 02/07 0701 - 02/08 0700 In: 240 [P.O.:240] Out: 2 [Urine:2] Intake/Output this shift:    Medications Current Facility-Administered Medications  Medication Dose Route Frequency Provider Last Rate Last Dose  . acetaminophen (TYLENOL) tablet 650 mg  650 mg Oral Q6H PRN Francesca Oman, DO      . albuterol (PROVENTIL) (2.5 MG/3ML) 0.083% nebulizer solution 2.5 mg  2.5 mg Nebulization Q4H PRN Thayer Headings, MD      . aspirin EC tablet 325 mg  325 mg Oral Daily Francesca Oman, DO      . heparin ADULT infusion 100 units/mL (25000 units/250 mL)  800 Units/hr Intravenous Continuous Thayer Headings, MD 8 mL/hr at 08/08/14 0343 800 Units/hr at 08/08/14 0343  . hydroxypropyl methylcellulose / hypromellose (ISOPTO TEARS / GONIOVISC) 2.5 % ophthalmic solution 1 drop  1 drop Left Eye QID PRN Francesca Oman, DO      . losartan (COZAAR) tablet 50 mg  50 mg Oral Daily Francesca Oman, DO      . pantoprazole (PROTONIX) EC tablet 40 mg  40 mg Oral Daily Francesca Oman, DO   40 mg at 08/07/14 1605  . sodium chloride 0.9 % injection 3 mL  3 mL Intravenous Q12H Francesca Oman, DO   3 mL at 08/07/14 1607  . sodium chloride 0.9 % injection 3 mL  3 mL Intravenous Q12H Francesca Oman, DO   3 mL at 08/07/14 2144    PE: General appearance: alert, cooperative and no distress Neck: no carotid  bruit and no JVD Lungs: clear to auscultation bilaterally Heart: regular rate and rhythm, S1, S2 normal, no murmur, click, rub or gallop Extremities: no LEE Pulses: 2+ and symmetric Skin: warm and dry Neurologic: Grossly normal  Lab Results:   Recent Labs  08/06/14 0709 08/07/14 0501 08/08/14 0045  WBC 3.9* 7.5 7.1  HGB 10.5* 10.1* 9.5*  HCT 31.1* 30.5* 28.7*  PLT 144* 158 131*   BMET  Recent Labs  08/06/14 0709 08/07/14 0501 08/08/14 0045  NA 140 143 139  K 3.2* 4.1 3.6  CL 110 115* 111  CO2 21 22 20   GLUCOSE 115* 100* 111*  BUN 19 24* 19  CREATININE 1.16* 1.07 1.03  CALCIUM 9.1 9.4 8.6   PT/INR  Recent Labs  08/07/14 0601  LABPROT 14.2  INR 1.09   Cardiac Panel (last 3 results)  Recent Labs  08/07/14 1347 08/07/14 1825 08/08/14 0045  TROPONINI 0.41* 0.37* 0.31*    Studies/Results:  CT of chest 08/06/14 IMPRESSION: 1. Negative for acute pulmonary embolus, pneumonia or other acute cardiopulmonary process. 2. 4 vessel coronary artery calcification. 3. Borderline cardiomegaly. 4. Small hiatal hernia. 5. Bilateral dependent lower lobe atelectasis.    Assessment/Plan  Active Problems:   Anemia   Essential hypertension  GERD   Chronic venous insufficiency b/l lower extremities   Ectropion of left lower eyelid   Heart failure with preserved ejection fraction   NSTEMI (non-ST elevated myocardial infarction)   1. Chest pain: cardiac markers minimally elevated x 3. She is CP free on IV heparin. CT scan of chest 2/6 demonstrated no PE but did show 4 vessel coronary artery calcification. Given multiple co morbidities, she is not a good candidate for invasive evaluation. Plan is for NST today to assess for ischemia. If she is high risk, then cardiac cath would be a consideration. Continue medical therapy: ASA, ARB. She may benefit from low dose BB and statin. There is room in BP and HR for BB.  2. Anemia: further drop in Hgb to 9.5. She is on Fe. IM  following. Recommend repeat CBC in the am.     LOS: 1 day    Brittainy M. Ladoris Gene 08/08/2014 7:53 AM  Patient seen and examined and history reviewed. Agree with above findings and plan. See my earlier note today.  Claudia Greenley [place], [place] 08/08/2014 1:59 PM

## 2014-08-08 NOTE — Progress Notes (Signed)
      I spoke with patient about abnormal stress test results. LHC scheduled for tomorrow afternoon. NPO after midnight.  Risks and benefits of cardiac catheterization have been discussed with the patient.  These include bleeding, infection, kidney damage, stroke, heart attack, death.  The patient understands these risks and is willing to proceed.  Angelena Form PA-C  [facility]

## 2014-08-08 NOTE — Progress Notes (Signed)
PT Cancellation Note  Patient Details Name: EMONNI DEPASQUALE MRN: [id number] DOB: [birth year]   Cancelled Treatment:    Reason Eval/Treat Not Completed: Patient at procedure or test/unavailable   Was at [facility] at first PT eval attempt;   Will follow up later today as time allows;  Otherwise, will follow up for PT tomorrow;   Thank you,  Roney Marion, PT  Acute Rehabilitation Services Pager [phone] Office [phone]     Roney Marion [facility] 08/08/2014, 12:17 PM

## 2014-08-08 NOTE — Evaluation (Signed)
Occupational Therapy Evaluation Patient Details Name: Jane [name] MRN: [id number] DOB: [birth year] Today's Date: 08/08/2014    History of Present Illness [name] is an 81 yo woman with a history of chronic dsypnea, HFpEF, GERD, hypertension and anemia who presented with chest pain, shortness of breath at rest and has been found to have an NSTEMI with a troponin elevated to 0.50.   Clinical Impression   Patient independent > mod I PTA. Patient currently requires up to total assist for LB ADLs and min assist for functional mobility/transfers. Patient tends to furniture walk and is unsafe during functional mobility. Patient will benefit from acute OT to increase overall independence in the areas of ADLs, functional mobility, and overall safety in order to safely discharge to venue listed below.     Follow Up Recommendations  SNF;Supervision/Assistance - 24 hour (IF patient refuses SNF, will need 24/7 supervision/assistance at home and [facility])    Equipment Recommendations   (TBD)    Recommendations for Other Services  None at this time     Precautions / Restrictions Precautions Precautions: Fall Restrictions Weight Bearing Restrictions: No      Mobility Bed Mobility Overal bed mobility: Needs Assistance Bed Mobility: Rolling;Sidelying to Sit Rolling: Supervision Sidelying to sit: Min guard;HOB elevated       General bed mobility comments: using bed rails   Transfers Overall transfer level: Needs assistance   Transfers: Sit to/from Stand;Stand Pivot Transfers Sit to Stand: Min guard Stand pivot transfers: Min guard       General transfer comment: Patient with LOB and used required BUE support for safe transfer and transition    Balance Overall balance assessment: Needs assistance Sitting-balance support: No upper extremity supported Sitting balance-Leahy Scale: Fair     Standing balance support: No upper extremity supported;During functional  activity Standing balance-Leahy Scale: Poor     ADL Overall ADL's : Needs assistance/impaired Eating/Feeding: NPO   Grooming: Min guard;Standing   Upper Body Bathing: Minimal assitance;Sitting   Lower Body Bathing: Total assistance;Sit to/from stand   Upper Body Dressing : Minimal assistance;Sitting   Lower Body Dressing: Total assistance;Sit to/from stand   Toilet Transfer: Minimal assistance;Ambulation Toilet Transfer Details (indicate cue type and reason): Patient tends to furniture walk without use of RW         Functional mobility during ADLs: Minimal assistance General ADL Comments: Referring to LB ADLs, patient reports "I can do this when I'm at home without all this stuff on". Patient unable to cross BLEs or bend down to them for LB ADLs. Patient with some SOB during activity but 02 sats remained greater than 90%. Patient with some LOB during standing and ambulation without use or RW.      Vision Additional Comments: Patient with droopy eyelids (left ->bottom eye lide and right -> top eye lid). Patient reports this is premorbid           Pertinent Vitals/Pain Pain Assessment: No/denies pain     Hand Dominance Right   Extremity/Trunk Assessment Upper Extremity Assessment Upper Extremity Assessment: Generalized weakness (pt with decreased strength throughout BUEs, unable to fully flex shoulders against gravity. Pt reports "I was able to do this at home with no problem")   Lower Extremity Assessment Lower Extremity Assessment: Defer to PT evaluation       Communication Communication Communication: No difficulties   Cognition Arousal/Alertness: Awake/alert Behavior During Therapy: WFL for tasks assessed/performed Overall Cognitive Status: Within Functional Limits for tasks assessed  Home Living Family/patient expects to be discharged to:: Private residence Living Arrangements: Alone Available Help at Discharge: Family;Available  PRN/intermittently;Personal care attendant (pt reports PCA cones about 2x per week) Type of Home: Apartment Home Access: Elevator     Home Layout: One level     Bathroom Shower/Tub: Tub/shower unit;Curtain   Biochemist, clinical: Standard     Home Equipment: Cane - single point;Walker - 2 wheels;Shower seat   Additional Comments: does own laundry, housework, cooking per patient. Does not drive.      Prior Functioning/Environment Level of Independence: Independent;Independent with assistive device(s);Needs assistance  Gait / Transfers Assistance Needed: "Furniture walks" and uses RW for community ambulation. ADL's / Homemaking Assistance Needed: Has an aide come in 2 days/week to assist with IADLs. Reports (I) for ADLs. Uses shower chair for bathing. Cooks.        OT Diagnosis: Generalized weakness   OT Problem List: Decreased strength;Decreased activity tolerance;Impaired balance (sitting and/or standing);Decreased coordination;Decreased safety awareness;Decreased knowledge of use of DME or AE;Decreased knowledge of precautions;Pain   OT Treatment/Interventions: Self-care/ADL training;Therapeutic exercise;Energy conservation;DME and/or AE instruction;Therapeutic activities;Patient/family education;Balance training    OT Goals(Current goals can be found in the care plan section) Acute Rehab OT Goals Patient Stated Goal: go home OT Goal Formulation: With patient Time For Goal Achievement: 08/15/14 Potential to Achieve Goals: Good ADL Goals Pt Will Perform Grooming: with supervision;standing Pt Will Perform Upper Body Bathing: with set-up;sitting Pt Will Perform Lower Body Bathing: with supervision;sit to/from stand;with adaptive equipment Pt Will Perform Upper Body Dressing: with set-up;sitting Pt Will Perform Lower Body Dressing: with supervision;sit to/from stand;with adaptive equipment Pt Will Transfer to Toilet: with supervision;ambulating Pt Will Perform Tub/Shower Transfer:  Tub transfer;with supervision;tub bench Pt/caregiver will Perform Home Exercise Program: Both right and left upper extremity;Increased strength;With written HEP provided  OT Frequency: Min 2X/week   Barriers to D/C: Decreased caregiver support          End of Session Equipment Utilized During Treatment: Gait belt  Activity Tolerance: Patient tolerated treatment well Patient left: in chair;with call bell/phone within reach   Time: 8270-7867 OT Time Calculation (min): 29 min Charges:  OT General Charges $OT Visit: 1 Procedure OT Evaluation $Initial OT Evaluation Tier I: 1 Procedure OT Treatments $Therapeutic Activity: 8-22 mins  Aminat [place] , MS, OTR/L, CLT Pager: [phone]  08/08/2014, 9:55 AM

## 2014-08-08 NOTE — Clinical Social Work Placement (Addendum)
Clinical Social Work Department CLINICAL SOCIAL WORK PLACEMENT NOTE 08/08/2014  Patient:  Jane, [name]  Account Number:  [id number] Admit date:  08/07/2014  Clinical Social Worker:  Domenica Reamer, CLINICAL SOCIAL WORKER  Date/time:  08/08/2014 04:36 PM  Clinical Social Work is seeking post-discharge placement for this patient at the following level of care:   SKILLED NURSING   (*CSW will update this form in Epic as items are completed)   08/08/2014  Patient/family provided with [place] Department of Clinical Social Work's list of facilities offering this level of care within the geographic area requested by the patient (or if unable, by the patient's family).  08/08/2014  Patient/family informed of their freedom to choose among providers that offer the needed level of care, that participate in Medicare, Medicaid or managed care program needed by the patient, have an available bed and are willing to accept the patient.  08/08/2014  Patient/family informed of [facility]' ownership interest in [facility], as well as of the fact that they are under no obligation to receive care at this facility.  PASARR submitted to EDS on 08/08/2014 PASARR number received on 08/08/2014  FL2 transmitted to all facilities in geographic area requested by pt/family on  08/08/2014 FL2 transmitted to all facilities within larger geographic area on   Patient informed that his/her managed care company has contracts with or will negotiate with  certain facilities, including the following:     Patient/family informed of bed offers received: 08/10/14  Patient chooses bed at [facility] Physician recommends and patient chooses bed at    Patient to be transferred to [facility] on 08/10/14   Patient to be transferred to facility by ambulance Patient and family notified of transfer on 08/10/14 Name of family member notified: Rockney Ghee ([phone])   The following physician request were  entered in [place]:  08/10/14 - Discharged by: Shaquita [place], MSW, [place] Licensed Clinical Social Worker Clinical Social Work Department [place] [phone]   Additional Comments: Domenica Reamer, [place] Social Worker [phone]

## 2014-08-08 NOTE — Clinical Social Work Note (Signed)
Clinical Social Work Department BRIEF PSYCHOSOCIAL ASSESSMENT 08/08/2014  Patient:  Jane [name], Jane [name]     Account Number:  [id number]     Admit date:  08/07/2014  Clinical Social Worker:  Domenica Reamer, [place]  Date/Time:  08/08/2014 04:15 PM  Referred by:  Physician  Date Referred:  08/08/2014 Referred for  SNF Placement   Other Referral:   Interview type:  Patient Other interview type:    PSYCHOSOCIAL DATA Living Status:  ALONE Admitted from facility:   Level of care:   Primary support name:  Cleatus Tatum Primary support relationship to patient:  SIBLING Degree of support available:   patient reports that sister is very supportive and lives in the same building as her Psychologist, counselling)    CURRENT CONCERNS Current Concerns  Post-Acute Placement   Other Concerns:    SOCIAL WORK ASSESSMENT / PLAN CSW spoke with patient concerning OT recommendation for SNF placement.  Patient is hesitant to go to SNF and is concerned about some personal things she needs attended to in her apartment- pt feels as if she can get her sister to take care of these needs.    Patient is agreeable to looking into available SNF facilities and reports that her brother Rolanda Lundborg) is staying in one after a hip fracture (potential [place]).   Assessment/plan status:  Psychosocial Support/Ongoing Assessment of Needs Other assessment/ plan:   FL2  PASAR   Information/referral to community resources:   [facility] sn[facility]    PATIENT'S/FAMILY'S RESPONSE TO PLAN OF CARE: Patient is agreeable to SNF placement for short term rehab but does not want to be away from her apartment long- patient has lived there for 20 years and is hopeful to returning to independent living soon       Domenica Reamer, [place] Worker [phone]

## 2014-08-09 ENCOUNTER — Encounter (HOSPITAL_COMMUNITY): Payer: Self-pay | Admitting: Cardiology

## 2014-08-09 ENCOUNTER — Encounter (HOSPITAL_COMMUNITY)
Admission: EM | Disposition: A | Discharge status: Skilled Nursing Facility | Payer: Medicare Other | Source: Home / Self Care | Attending: Internal Medicine

## 2014-08-09 DIAGNOSIS — E785 Hyperlipidemia, unspecified: Secondary | ICD-10-CM

## 2014-08-09 DIAGNOSIS — I2510 Atherosclerotic heart disease of native coronary artery without angina pectoris: Secondary | ICD-10-CM

## 2014-08-09 DIAGNOSIS — D649 Anemia, unspecified: Secondary | ICD-10-CM

## 2014-08-09 HISTORY — PX: PERCUTANEOUS CORONARY STENT INTERVENTION (PCI-S): SHX5485

## 2014-08-09 HISTORY — PX: LEFT HEART CATHETERIZATION WITH CORONARY ANGIOGRAM: SHX5451

## 2014-08-09 LAB — BASIC METABOLIC PANEL
ANION GAP: 4 — AB (ref 5–15)
BUN: 18 mg/dL (ref 6–23)
CALCIUM: 8.6 mg/dL (ref 8.4–10.5)
CHLORIDE: 113 mmol/L — AB (ref 96–112)
CO2: 25 mmol/L (ref 19–32)
CREATININE: 1.09 mg/dL (ref 0.50–1.10)
GFR calc Af Amer: 53 mL/min — ABNORMAL LOW (ref >90)
GFR calc non Af Amer: 46 mL/min — ABNORMAL LOW (ref >90)
GLUCOSE: 87 mg/dL (ref 70–99)
Potassium: 3.9 mmol/L (ref 3.5–5.1)
Sodium: 142 mmol/L (ref 135–145)

## 2014-08-09 LAB — HEPARIN LEVEL (UNFRACTIONATED): Heparin Unfractionated: 0.55 IU/mL (ref 0.30–0.70)

## 2014-08-09 LAB — CBC
HCT: 28.1 % — ABNORMAL LOW (ref 36.0–46.0)
HEMOGLOBIN: 9.4 g/dL — AB (ref 12.0–15.0)
MCH: 31.8 pg (ref 26.0–34.0)
MCHC: 33.5 g/dL (ref 30.0–36.0)
MCV: 94.9 fL (ref 78.0–100.0)
PLATELETS: 136 10*3/uL — AB (ref 150–400)
RBC: 2.96 MIL/uL — ABNORMAL LOW (ref 3.87–5.11)
RDW: 13.5 % (ref 11.5–15.5)
WBC: 4.8 10*3/uL (ref 4.0–10.5)

## 2014-08-09 SURGERY — LEFT HEART CATHETERIZATION WITH CORONARY ANGIOGRAM
Anesthesia: LOCAL

## 2014-08-09 MED ORDER — SODIUM CHLORIDE 0.9 % IJ SOLN
3.0000 mL | Freq: Two times a day (BID) | INTRAMUSCULAR | Status: DC
  Administered 2014-08-09 (×2): 3 mL via INTRAVENOUS

## 2014-08-09 MED ORDER — HEPARIN SODIUM (PORCINE) 1000 UNIT/ML IJ SOLN
INTRAMUSCULAR | Status: AC
  Filled 2014-08-09: qty 1

## 2014-08-09 MED ORDER — LIDOCAINE HCL (PF) 1 % IJ SOLN
INTRAMUSCULAR | Status: AC
  Filled 2014-08-09: qty 30

## 2014-08-09 MED ORDER — SODIUM CHLORIDE 0.9 % IV SOLN: 1.0000 mL/kg/h | INTRAVENOUS | Status: AC

## 2014-08-09 MED ORDER — ALUM & MAG HYDROXIDE-SIMETH 200-200-20 MG/5ML PO SUSP
30.0000 mL | Freq: Four times a day (QID) | ORAL | Status: DC | PRN
  Administered 2014-08-09: 21:00:00 30 mL via ORAL
  Filled 2014-08-09: qty 30

## 2014-08-09 MED ORDER — SODIUM CHLORIDE 0.9 % IV SOLN
1.0000 mL/kg/h | INTRAVENOUS | Status: DC
  Administered 2014-08-09: 1 mL/kg/h via INTRAVENOUS

## 2014-08-09 MED ORDER — ASPIRIN 81 MG PO CHEW
81.0000 mg | CHEWABLE_TABLET | Freq: Every day | ORAL | Status: DC
  Administered 2014-08-10: 81 mg via ORAL
  Filled 2014-08-09: qty 1

## 2014-08-09 MED ORDER — ONDANSETRON HCL 4 MG/2ML IJ SOLN: 4.0000 mg | Freq: Four times a day (QID) | INTRAMUSCULAR | Status: DC | PRN

## 2014-08-09 MED ORDER — NITROGLYCERIN 1 MG/10 ML FOR IR/CATH LAB
INTRA_ARTERIAL | Status: AC
  Filled 2014-08-09: qty 10

## 2014-08-09 MED ORDER — SODIUM CHLORIDE 0.9 % IV SOLN: 250.0000 mL | INTRAVENOUS | Status: DC | PRN

## 2014-08-09 MED ORDER — ASPIRIN 81 MG PO CHEW
81.0000 mg | CHEWABLE_TABLET | ORAL | Status: AC
  Administered 2014-08-09: 81 mg via ORAL
  Filled 2014-08-09: qty 1

## 2014-08-09 MED ORDER — VERAPAMIL HCL 2.5 MG/ML IV SOLN
INTRAVENOUS | Status: AC
  Filled 2014-08-09: qty 2

## 2014-08-09 MED ORDER — ATORVASTATIN CALCIUM 20 MG PO TABS
20.0000 mg | ORAL_TABLET | Freq: Every day | ORAL | Status: DC
  Administered 2014-08-10: 17:00:00 20 mg via ORAL
  Filled 2014-08-09 (×2): qty 1

## 2014-08-09 MED ORDER — HEPARIN (PORCINE) IN NACL 2-0.9 UNIT/ML-% IJ SOLN
INTRAMUSCULAR | Status: AC
  Filled 2014-08-09: qty 1500

## 2014-08-09 MED ORDER — TICAGRELOR 90 MG PO TABS
ORAL_TABLET | ORAL | Status: AC
  Filled 2014-08-09: qty 1

## 2014-08-09 MED ORDER — BIVALIRUDIN 250 MG IV SOLR
INTRAVENOUS | Status: AC
  Filled 2014-08-09: qty 250

## 2014-08-09 MED ORDER — TICAGRELOR 90 MG PO TABS
90.0000 mg | ORAL_TABLET | Freq: Two times a day (BID) | ORAL | Status: DC
  Administered 2014-08-10: 90 mg via ORAL
  Filled 2014-08-09 (×2): qty 1

## 2014-08-09 MED ORDER — MIDAZOLAM HCL 2 MG/2ML IJ SOLN
INTRAMUSCULAR | Status: AC
  Filled 2014-08-09: qty 2

## 2014-08-09 MED ORDER — SODIUM CHLORIDE 0.9 % IJ SOLN: 3.0000 mL | INTRAMUSCULAR | Status: DC | PRN

## 2014-08-09 NOTE — Progress Notes (Signed)
TELEMETRY: Reviewed telemetry pt in NSR with PACs: Filed Vitals:   08/08/14 1155 08/08/14 1342 08/08/14 2100 08/09/14 0356  BP: 182/84 150/69 126/56 122/44  Pulse: 108 81 63 57  Temp:  97.4 F (36.3 C) 98.5 F (36.9 C) 98.4 F (36.9 C)  TempSrc:  Oral Oral Oral  Resp:  18 18 18   Height:      Weight:      SpO2:  100% 99% 99%    Intake/Output Summary (Last 24 hours) at 08/09/14 0743 Last data filed at 08/08/14 1500  Gross per 24 hour  Intake    240 ml  Output    425 ml  Net   -185 ml   Filed Weights   08/07/14 0921 08/08/14 0346  Weight: 168 lb 3.4 oz (76.3 kg) 166 lb 0.1 oz (75.3 kg)    Subjective Denies any chest pain or SOB. NPO for cardiac cath today.  Marland Kitchen aspirin EC  325 mg Oral Daily  . bisoprolol  5 mg Oral Daily  . isosorbide mononitrate  30 mg Oral Daily  . losartan  50 mg Oral Daily  . pantoprazole  40 mg Oral Daily  . sodium chloride  3 mL Intravenous Q12H  . sodium chloride  3 mL Intravenous Q12H  . sodium chloride  3 mL Intravenous Q12H   . sodium chloride 1 mL/kg/hr (08/09/14 0349)  . heparin 800 Units/hr (08/08/14 0343)    LABS: Basic Metabolic Panel:  Recent Labs  08/08/14 0045 08/09/14 0548  NA 139 142  K 3.6 3.9  CL 111 113*  CO2 20 25  GLUCOSE 111* 87  BUN 19 18  CREATININE 1.03 1.09  CALCIUM 8.6 8.6   Liver Function Tests: No results for input(s): AST, ALT, ALKPHOS, BILITOT, PROT, ALBUMIN in the last 72 hours. No results for input(s): LIPASE, AMYLASE in the last 72 hours. CBC:  Recent Labs  08/07/14 0501 08/08/14 0045 08/09/14 0548  WBC 7.5 7.1 4.8  NEUTROABS 6.2  --   --   HGB 10.1* 9.5* 9.4*  HCT 30.5* 28.7* 28.1*  MCV 94.7 93.5 94.9  PLT 158 131* 136*   Cardiac Enzymes:  Recent Labs  08/07/14 1347 08/07/14 1825 08/08/14 0045  TROPONINI 0.41* 0.37* 0.31*   BNP: No results for input(s): PROBNP in the last 72 hours. D-Dimer: No results for input(s): DDIMER in the last 72 hours. Hemoglobin A1C: No results  for input(s): HGBA1C in the last 72 hours. Fasting Lipid Panel: No results for input(s): CHOL, HDL, LDLCALC, TRIG, CHOLHDL, LDLDIRECT in the last 72 hours. Thyroid Function Tests: No results for input(s): TSH, T4TOTAL, T3FREE, THYROIDAB in the last 72 hours.  Invalid input(s): FREET3   Radiology/Studies:  Nm Myocar Multi W/spect W/wall Motion / Ef  08/08/2014   CLINICAL DATA:  Chest pain, hypertension, hyperlipidemia, shortness of breath and elevated troponin levels  EXAM: MYOCARDIAL IMAGING WITH SPECT (REST AND PHARMACOLOGIC-STRESS)  GATED LEFT VENTRICULAR WALL MOTION STUDY  LEFT VENTRICULAR EJECTION FRACTION  TECHNIQUE: Standard myocardial SPECT imaging was performed after resting intravenous injection of 10 mCi Tc-32m sestamibi. Subsequently, intravenous infusion of Lexiscan was performed under the supervision of the Cardiology staff. At peak effect of the drug, 30 mCi Tc-18m sestamibi was injected intravenously and standard myocardial SPECT imaging was performed. Quantitative gated imaging was also performed to evaluate left ventricular wall motion, and estimate left ventricular ejection fraction.  COMPARISON:  None.  FINDINGS: Perfusion: There is evidence of moderate sized perfusion defect of the lateral and  inferolateral walls extending roughly from the base of the heart to the mid ventricular level on the stress study with relatively normal perfusion of this region on the rest study. Findings are consistent with ischemia. No fixed perfusion defects are identified.  Wall Motion: Normal left ventricular wall motion. No left ventricular dilation.  Left Ventricular Ejection Fraction: 70 %  End diastolic volume 55 ml  End systolic volume 16 ml  IMPRESSION: 1. Evidence of inducible myocardial ischemia involving the lateral and inferolateral walls of the left ventricle. The perfusion defect is moderate in size.  2. Normal left ventricular wall motion.  3. Left ventricular ejection fraction 70%  4.  Intermediate-risk stress test findings*.  *2012 Appropriate Use Criteria for Coronary Revascularization Focused Update: J Am Coll Cardiol. 8937;34(2):876-811. http://content.airportbarriers.com.aspx?articleid=1201161   Electronically Signed   By: Aletta Edouard M.D.   On: 08/08/2014 14:44   Portable Chest 1 View  08/07/2014   CLINICAL DATA:  Initial evaluation for chest pain with burning sensation in throat  EXAM: PORTABLE CHEST - 1 VIEW  COMPARISON:  08/06/2014  FINDINGS: Mild cardiac enlargement. Stable aortic calcification. Vascular pattern normal. No consolidation or effusion.  IMPRESSION: No active disease.   Electronically Signed   By: Skipper Cliche M.D.   On: 08/07/2014 17:30   Ecg 2.7/16: NSR with LAE. Otherwise normal.   Echo:Transthoracic Echocardiography  Patient:  Zaeda, Mcferran MR #:    [id number] Study Date: 06/01/2014 Gender:   F Age:    81 Height:   160 cm Weight:   75.8 kg BSA:    1.86 m^2 Pt. Status: Room:    [address]  Madilyn Fireman [address], [place], [place] [id number] SONOGRAPHER [place], Inpatient  cc:  ------------------------------------------------------------------- LV EF: 60%  ------------------------------------------------------------------- Indications:   Dyspnea 786.09.  ------------------------------------------------------------------- History:  PMH:  Stroke. Risk factors: Hypertension. Dyslipidemia.  ------------------------------------------------------------------- Study Conclusions  - Left ventricle: The cavity size was normal. Wall thickness was increased in a pattern of mild LVH. The estimated ejection fraction was 60%. Wall motion was normal; there were no regional wall motion abnormalities. - Aortic valve: There was mild regurgitation. - Right ventricle: The cavity size  was normal. Systolic function was normal. - Pulmonary arteries: PA peak pressure: 29 mm Hg (S). - Pericardium, extracardiac: A trivial pericardial effusion was identified posterior to the heart.  Transthoracic echocardiography. M-mode, complete 2D, spectral Doppler, and color Doppler. Birthdate: Patient birthdate: 05/18/1929. Age: Patient is 81 yr old. Sex: Gender: female. BMI: 29.6 kg/m^2. Blood pressure:   123/50 Patient status: Inpatient. Study date: Study date: 06/01/2014. Study time: 08:18 AM. Location: Bedside.  -------------------------------------------------------------------  ------------------------------------------------------------------- Left ventricle: The cavity size was normal. Wall thickness was increased in a pattern of mild LVH. The estimated ejection fraction was 60%. Wall motion was normal; there were no regional wall motion abnormalities.  ------------------------------------------------------------------- Aortic valve:  Structurally normal valve.  Cusp separation was normal. Doppler: Transvalvular velocity was within the normal range. There was no stenosis. There was mild regurgitation.  ------------------------------------------------------------------- Aorta: Aortic root: The aortic root was normal in size.  ------------------------------------------------------------------- Mitral valve:  Mildly thickened leaflets . Doppler: There was trivial regurgitation.  Peak gradient (D): 4 mm Hg.  ------------------------------------------------------------------- Left atrium: The atrium was at the upper limits of normal in size.  ------------------------------------------------------------------- Right ventricle: The cavity size was normal. Systolic function was normal.  ------------------------------------------------------------------- Pulmonic valve:  The valve appears to  be grossly normal. Doppler: There was no  significant regurgitation.  ------------------------------------------------------------------- Tricuspid valve:  The valve appears to be grossly normal. Doppler: There was mild regurgitation.  ------------------------------------------------------------------- Right atrium: The atrium was at the upper limits of normal in size.  ------------------------------------------------------------------- Pericardium: A trivial pericardial effusion was identified posterior to the heart.  ------------------------------------------------------------------- Measurements  Left ventricle              Value    Reference LV ID, ED, PLAX chordal     (L)   41.9 mm   43 - 52 LV ID, ES, PLAX chordal         26.9 mm   23 - 38 LV fx shortening, PLAX chordal      36  %   >=29 LV PW thickness, ED           10.7 mm   --------- IVS/LV PW ratio, ED           1.06     <=1.3 LV e&', lateral              6.14 cm/s  --------- LV E/e&', lateral             16.94    --------- LV e&', medial              5.7  cm/s  --------- LV E/e&', medial             18.25    --------- LV e&', average              5.92 cm/s  --------- LV E/e&', average             17.57    ---------  Ventricular septum            Value    Reference IVS thickness, ED            11.3 mm   ---------  Aortic valve               Value    Reference Aortic regurg pressure half-time     449  ms   ---------  Aorta                  Value    Reference Aortic root ID, ED            25  mm   ---------  Left atrium               Value    Reference LA ID, A-P, ES              38  mm   --------- LA ID/bsa, A-P               2.04 cm/m^2 <=2.2 LA volume, S               84  ml   --------- LA volume/bsa, S             45.2 ml/m^2 --------- LA volume, ES, 1-p A4C          79  ml   --------- LA volume/bsa, ES, 1-p A4C        42.5 ml/m^2 --------- LA volume, ES, 1-p A2C          80  ml   --------- LA volume/bsa, ES, 1-p A2C        43  ml/m^2 ---------  Mitral valve  Value    Reference Mitral E-wave peak velocity       104  cm/s  --------- Mitral A-wave peak velocity       126  cm/s  --------- Mitral deceleration time     (H)   296  ms   150 - 230 Mitral peak gradient, D         4   mm Hg --------- Mitral E/A ratio, peak          0.8     ---------  Pulmonary arteries            Value    Reference PA pressure, S, DP            29  mm Hg <=30  Tricuspid valve             Value    Reference Tricuspid regurg peak velocity      254  cm/s  --------- Tricuspid peak RV-RA gradient      26  mm Hg ---------  Systemic veins              Value    Reference Estimated CVP              3   mm Hg ---------  Right ventricle             Value    Reference RV pressure, S, DP            29  mm Hg <=30 RV s&', lateral, S            13.8 cm/s  ---------  Legend: (L) and (H) mark values outside specified reference range.  ------------------------------------------------------------------- Prepared and Electronically Authenticated by  Dola Argyle, MD 2015-12-02T10:29:12  PHYSICAL EXAM General: Well developed, elderly female, in no acute distress. Head: Normocephalic, atraumatic, sclera non-icteric, oropharynx is clear Neck: Negative for carotid bruits. JVD 6 cm. No adenopathy Lungs: Clear  bilaterally to auscultation without wheezes, rales, or rhonchi. Breathing is unlabored. Heart: RRR S1 S2 without murmurs, rubs, or gallops.  Abdomen: Soft, non-tender, non-distended with normoactive bowel sounds. No hepatomegaly. No rebound/guarding. No obvious abdominal masses. Msk:  Strength and tone appears normal for age. Extremities: No clubbing, cyanosis or edema.  Distal pedal pulses are 2+ and equal bilaterally. Neuro: Alert and oriented X 3. Moves all extremities spontaneously. Psych:  Responds to questions appropriately with a normal affect.  ASSESSMENT AND PLAN: 1. NSTEMI. Myoview is intermediate to high risk with a fairly large area of inferior and lateral ischemia. Pain free on meds now including IV heparin. Plan LHC today with possible PCI. The procedure and risks were reviewed including but not limited to death, myocardial infarction, stroke, arrythmias, bleeding, transfusion, emergency surgery, dye allergy, or renal dysfunction. The patient voices understanding and is agreeable to proceed.. 2. Dementia 3. HTN 4. Anemia Fe deficient 5. Hyperlipidemia- LDL 131 in December. Will start statin.    Present on Admission:  . NSTEMI (non-ST elevated myocardial infarction) . Anemia . Chronic venous insufficiency b/l lower extremities . Ectropion of left lower eyelid . Essential hypertension . GERD  Signed, Peter Martinique, [place] 08/09/2014 7:43 AM

## 2014-08-09 NOTE — Progress Notes (Signed)
TR BAND REMOVAL  LOCATION:    right radial  DEFLATED PER PROTOCOL:    Yes.    TIME BAND OFF / DRESSING APPLIED:    21:30   SITE UPON ARRIVAL:    Level 0  SITE AFTER BAND REMOVAL:    Level 0  REVERSE ALLEN'S TEST:     positive  CIRCULATION SENSATION AND MOVEMENT:    Within Normal Limits   Yes.    COMMENTS:   Pt tolerated removal of TR band without complications, will continue to monitor patient.

## 2014-08-09 NOTE — CV Procedure (Signed)
    Cardiac Catheterization Procedure Note  Name: Jane [name] MRN: [id number] DOB: Feb 15, 1929  Procedure: Left Heart Cath, Selective Coronary Angiography, LV angiography, PTCA and stenting of the RCA  Indication: 81 yo BF presents with a NSTEMI. Leane Call demonstrates a large area of ischemia in the inferior and lateral walls.  Procedural Details:  The right wrist was prepped, draped, and anesthetized with 1% lidocaine. Using the modified Seldinger technique, a 6 French slender sheath was introduced into the right radial artery. 3 mg of verapamil was administered through the sheath, weight-based unfractionated heparin was administered intravenously. Standard Judkins catheters were used for selective coronary angiography and left ventriculography. Catheter exchanges were performed over an exchange length guidewire.  PROCEDURAL FINDINGS Hemodynamics: AO 150/58 mean 94 mm Hg LV 150/17 mm hg   Coronary angiography: Coronary dominance: right  Left mainstem: Short and calcified. No obstructive disease.  Left anterior descending (LAD): The LAD is moderately calcified. There is a 50% stenosis in the distal vessel. The first diagonal is without significant disease  Left circumflex (LCx): The LCX gives rise to 2 OM branches before terminating on the posterolateral wall with some small PL branches. The first OM is occluded proximally with left to left collaterals. The second OM has an 80-90% stenosis proximally. The remainder of the LCx is without significant disease.  Right coronary artery (RCA):  The RCA is a tortuous vessel with diffuse calcification proximally. There is a 95% stenosis in the proximal to mid vessel.   Left ventriculography: Left ventricular systolic function is normal, LVEF is estimated at 55-65%, there is no significant mitral regurgitation   PCI Note:  Following the diagnostic procedure, the decision was made to proceed with PCI of the RCA.  Weight-based  bivalirudin was given for anticoagulation. Brilinta 180 mg was given orally. Once a therapeutic ACT was achieved, a 6 [place] LA 0.75 guide catheter was inserted.  A prowater coronary guidewire was used to cross the lesion.  The lesion was predilated with a 2.25 mm balloon.  The lesion was then stented with a 2.5 x 24 mm Synergy stent.  The stent was postdilated with a 2.75 mm noncompliant balloon.  Following PCI, there was 0% residual stenosis and TIMI-3 flow. Final angiography confirmed an excellent result. The patient tolerated the procedure well. There were no immediate procedural complications. A TR band was used for radial hemostasis. The patient was transferred to the post catheterization recovery area for further monitoring.  PCI Data: Vessel - RCA/Segment - proximal to mid Percent Stenosis (pre)  95% TIMI-flow 3 Stent 2.5 x 24 mm Synergy stent Percent Stenosis (post) 0% TIMI-flow (post) 3  Final Conclusions:   1. Severe 2 vessel obstructive CAD 2. Normal LV function 3. Successful stenting of the RCA with a Synergy DES.   Recommendations:  DAPT for one year. I would treat residual disease in OM 1 and 2 medically. If she has limiting angina despite good medical therapy she would be a candidate for PCI of OM2 but I would anticipate she will do well without additional intervention.  Arling Cerone Martinique, [place] 08/09/2014, 3:05 PM
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
## 2014-08-09 NOTE — Interval H&P Note (Signed)
History and Physical Interval Note:  08/09/2014 1:59 PM  Jane [name]  has presented today for surgery, with the diagnosis of cp  The various methods of treatment have been discussed with the patient and family. After consideration of risks, benefits and other options for treatment, the patient has consented to  Procedure(s): LEFT HEART CATHETERIZATION WITH CORONARY ANGIOGRAM (N/A) as a surgical intervention .  The patient's history has been reviewed, patient examined, no change in status, stable for surgery.  I have reviewed the patient's chart and labs.  Questions were answered to the patient's satisfaction.   Cath Lab Visit (complete for each Cath Lab visit)  Clinical Evaluation Leading to the Procedure:   ACS: Yes.    Non-ACS:    Anginal Classification: CCS IV  Anti-ischemic medical therapy: Minimal Therapy (1 class of medications)  Non-Invasive Test Results: Intermediate-risk stress test findings: cardiac mortality 1-3%/year  Prior CABG: No previous CABG        Collier Salina [facility] 08/09/2014 1:59 PM

## 2014-08-09 NOTE — Progress Notes (Signed)
ANTICOAGULATION CONSULT NOTE  Pharmacy Consult for Heparin Indication: chest pain/ACS  No Known Allergies  Patient Measurements: Height: 5\' 5"  (165.1 cm) Weight: 166 lb 0.1 oz (75.3 kg) IBW/kg (Calculated) : 57 Heparin Dosing Weight:   Vital Signs: Temp: 98.4 F (36.9 C) (02/09 0356) Temp Source: Oral (02/09 0356) BP: 122/44 mmHg (02/09 0356) Pulse Rate: 57 (02/09 0356)  Labs:  Recent Labs  08/07/14 0501 08/07/14 0601 08/07/14 1347  08/07/14 1825 08/07/14 2226 08/08/14 0045 08/09/14 0548  HGB 10.1*  --   --   --   --   --  9.5* 9.4*  HCT 30.5*  --   --   --   --   --  28.7* 28.1*  PLT 158  --   --   --   --   --  131* 136*  APTT  --  30  --   --   --   --   --   --   LABPROT  --  14.2  --   --   --   --   --   --   INR  --  1.09  --   --   --   --   --   --   HEPARINUNFRC  --  <0.10*  --   < >  --  0.74* 0.68 0.55  CREATININE 1.07  --   --   --   --   --  1.03 1.09  TROPONINI 0.50*  --  0.41*  --  0.37*  --  0.31*  --   < > = values in this interval not displayed.  Estimated Creatinine Clearance: 39.7 mL/min (by C-G formula based on Cr of 1.09).  Assessment: 81 year old female continues on heparin for chest pain.  Heparin level therapeutic this AM No bleeding noted, CBC trend down but stable  Goal of Therapy:  Heparin level 0.3-0.7 units/ml Monitor platelets by anticoagulation protocol: Yes   Plan:  Continue heparin at 800 units / hr Follow up after cath  Thank you. Anette Guarneri, PharmD [phone]

## 2014-08-09 NOTE — H&P (View-Only) (Signed)
TELEMETRY: Reviewed telemetry pt in NSR with PACs: Filed Vitals:   08/08/14 1155 08/08/14 1342 08/08/14 2100 08/09/14 0356  BP: 182/84 150/69 126/56 122/44  Pulse: 108 81 63 57  Temp:  97.4 F (36.3 C) 98.5 F (36.9 C) 98.4 F (36.9 C)  TempSrc:  Oral Oral Oral  Resp:  18 18 18   Height:      Weight:      SpO2:  100% 99% 99%    Intake/Output Summary (Last 24 hours) at 08/09/14 0743 Last data filed at 08/08/14 1500  Gross per 24 hour  Intake    240 ml  Output    425 ml  Net   -185 ml   Filed Weights   08/07/14 0921 08/08/14 0346  Weight: 168 lb 3.4 oz (76.3 kg) 166 lb 0.1 oz (75.3 kg)    Subjective Denies any chest pain or SOB. NPO for cardiac cath today.  Marland Kitchen aspirin EC  325 mg Oral Daily  . bisoprolol  5 mg Oral Daily  . isosorbide mononitrate  30 mg Oral Daily  . losartan  50 mg Oral Daily  . pantoprazole  40 mg Oral Daily  . sodium chloride  3 mL Intravenous Q12H  . sodium chloride  3 mL Intravenous Q12H  . sodium chloride  3 mL Intravenous Q12H   . sodium chloride 1 mL/kg/hr (08/09/14 0349)  . heparin 800 Units/hr (08/08/14 0343)    LABS: Basic Metabolic Panel:  Recent Labs  08/08/14 0045 08/09/14 0548  NA 139 142  K 3.6 3.9  CL 111 113*  CO2 20 25  GLUCOSE 111* 87  BUN 19 18  CREATININE 1.03 1.09  CALCIUM 8.6 8.6   Liver Function Tests: No results for input(s): AST, ALT, ALKPHOS, BILITOT, PROT, ALBUMIN in the last 72 hours. No results for input(s): LIPASE, AMYLASE in the last 72 hours. CBC:  Recent Labs  08/07/14 0501 08/08/14 0045 08/09/14 0548  WBC 7.5 7.1 4.8  NEUTROABS 6.2  --   --   HGB 10.1* 9.5* 9.4*  HCT 30.5* 28.7* 28.1*  MCV 94.7 93.5 94.9  PLT 158 131* 136*   Cardiac Enzymes:  Recent Labs  08/07/14 1347 08/07/14 1825 08/08/14 0045  TROPONINI 0.41* 0.37* 0.31*   BNP: No results for input(s): PROBNP in the last 72 hours. D-Dimer: No results for input(s): DDIMER in the last 72 hours. Hemoglobin A1C: No results  for input(s): HGBA1C in the last 72 hours. Fasting Lipid Panel: No results for input(s): CHOL, HDL, LDLCALC, TRIG, CHOLHDL, LDLDIRECT in the last 72 hours. Thyroid Function Tests: No results for input(s): TSH, T4TOTAL, T3FREE, THYROIDAB in the last 72 hours.  Invalid input(s): FREET3   Radiology/Studies:  Nm Myocar Multi W/spect W/wall Motion / Ef  08/08/2014   CLINICAL DATA:  Chest pain, hypertension, hyperlipidemia, shortness of breath and elevated troponin levels  EXAM: MYOCARDIAL IMAGING WITH SPECT (REST AND PHARMACOLOGIC-STRESS)  GATED LEFT VENTRICULAR WALL MOTION STUDY  LEFT VENTRICULAR EJECTION FRACTION  TECHNIQUE: Standard myocardial SPECT imaging was performed after resting intravenous injection of 10 mCi Tc-41m sestamibi. Subsequently, intravenous infusion of Lexiscan was performed under the supervision of the Cardiology staff. At peak effect of the drug, 30 mCi Tc-73m sestamibi was injected intravenously and standard myocardial SPECT imaging was performed. Quantitative gated imaging was also performed to evaluate left ventricular wall motion, and estimate left ventricular ejection fraction.  COMPARISON:  None.  FINDINGS: Perfusion: There is evidence of moderate sized perfusion defect of the lateral and  inferolateral walls extending roughly from the base of the heart to the mid ventricular level on the stress study with relatively normal perfusion of this region on the rest study. Findings are consistent with ischemia. No fixed perfusion defects are identified.  Wall Motion: Normal left ventricular wall motion. No left ventricular dilation.  Left Ventricular Ejection Fraction: 70 %  End diastolic volume 55 ml  End systolic volume 16 ml  IMPRESSION: 1. Evidence of inducible myocardial ischemia involving the lateral and inferolateral walls of the left ventricle. The perfusion defect is moderate in size.  2. Normal left ventricular wall motion.  3. Left ventricular ejection fraction 70%  4.  Intermediate-risk stress test findings*.  *2012 Appropriate Use Criteria for Coronary Revascularization Focused Update: J Am Coll Cardiol. 6712;45(8):099-833. http://content.airportbarriers.com.aspx?articleid=1201161   Electronically Signed   By: Aletta Edouard M.D.   On: 08/08/2014 14:44   Portable Chest 1 View  08/07/2014   CLINICAL DATA:  Initial evaluation for chest pain with burning sensation in throat  EXAM: PORTABLE CHEST - 1 VIEW  COMPARISON:  08/06/2014  FINDINGS: Mild cardiac enlargement. Stable aortic calcification. Vascular pattern normal. No consolidation or effusion.  IMPRESSION: No active disease.   Electronically Signed   By: Skipper Cliche M.D.   On: 08/07/2014 17:30   Ecg 2.7/16: NSR with LAE. Otherwise normal.   Echo:Transthoracic Echocardiography  Patient:  Xavier, Fournier MR #:    [id number] Study Date: 06/01/2014 Gender:   F Age:    49 Height:   160 cm Weight:   75.8 kg BSA:    1.86 m^2 Pt. Status: Room:    [address]  Madilyn Fireman [address], [place], [place] [id number] [place], Inpatient  cc:  ------------------------------------------------------------------- LV EF: 60%  ------------------------------------------------------------------- Indications:   Dyspnea 786.09.  ------------------------------------------------------------------- History:  PMH:  Stroke. Risk factors: Hypertension. Dyslipidemia.  ------------------------------------------------------------------- Study Conclusions  - Left ventricle: The cavity size was normal. Wall thickness was increased in a pattern of mild LVH. The estimated ejection fraction was 60%. Wall motion was normal; there were no regional wall motion abnormalities. - Aortic valve: There was mild regurgitation. - Right ventricle: The cavity size  was normal. Systolic function was normal. - Pulmonary arteries: PA peak pressure: 29 mm Hg (S). - Pericardium, extracardiac: A trivial pericardial effusion was identified posterior to the heart.  Transthoracic echocardiography. M-mode, complete 2D, spectral Doppler, and color Doppler. Birthdate: Patient birthdate: [birth year]. Age: Patient is 80 yr old. Sex: Gender: female. BMI: 29.6 kg/m^2. Blood pressure:   123/50 Patient status: Inpatient. Study date: Study date: 06/01/2014. Study time: 08:18 AM. Location: Bedside.  -------------------------------------------------------------------  ------------------------------------------------------------------- Left ventricle: The cavity size was normal. Wall thickness was increased in a pattern of mild LVH. The estimated ejection fraction was 60%. Wall motion was normal; there were no regional wall motion abnormalities.  ------------------------------------------------------------------- Aortic valve:  Structurally normal valve.  Cusp separation was normal. Doppler: Transvalvular velocity was within the normal range. There was no stenosis. There was mild regurgitation.  ------------------------------------------------------------------- Aorta: Aortic root: The aortic root was normal in size.  ------------------------------------------------------------------- Mitral valve:  Mildly thickened leaflets . Doppler: There was trivial regurgitation.  Peak gradient (D): 4 mm Hg.  ------------------------------------------------------------------- Left atrium: The atrium was at the upper limits of normal in size.  ------------------------------------------------------------------- Right ventricle: The cavity size was normal. Systolic function was normal.  ------------------------------------------------------------------- Pulmonic valve:  The valve appears to  be grossly normal. Doppler: There was no  significant regurgitation.  ------------------------------------------------------------------- Tricuspid valve:  The valve appears to be grossly normal. Doppler: There was mild regurgitation.  ------------------------------------------------------------------- Right atrium: The atrium was at the upper limits of normal in size.  ------------------------------------------------------------------- Pericardium: A trivial pericardial effusion was identified posterior to the heart.  ------------------------------------------------------------------- Measurements  Left ventricle              Value    Reference LV ID, ED, PLAX chordal     (L)   41.9 mm   43 - 52 LV ID, ES, PLAX chordal         26.9 mm   23 - 38 LV fx shortening, PLAX chordal      36  %   >=29 LV PW thickness, ED           10.7 mm   --------- IVS/LV PW ratio, ED           1.06     <=1.3 LV e&', lateral              6.14 cm/s  --------- LV E/e&', lateral             16.94    --------- LV e&', medial              5.7  cm/s  --------- LV E/e&', medial             18.25    --------- LV e&', average              5.92 cm/s  --------- LV E/e&', average             17.57    ---------  Ventricular septum            Value    Reference IVS thickness, ED            11.3 mm   ---------  Aortic valve               Value    Reference Aortic regurg pressure half-time     449  ms   ---------  Aorta                  Value    Reference Aortic root ID, ED            25  mm   ---------  Left atrium               Value    Reference LA ID, A-P, ES              38  mm   --------- LA ID/bsa, A-P               2.04 cm/m^2 <=2.2 LA volume, S               84  ml   --------- LA volume/bsa, S             45.2 ml/m^2 --------- LA volume, ES, 1-p A4C          79  ml   --------- LA volume/bsa, ES, 1-p A4C        42.5 ml/m^2 --------- LA volume, ES, 1-p A2C          80  ml   --------- LA volume/bsa, ES, 1-p A2C        43  ml/m^2 ---------  Mitral valve  Value    Reference Mitral E-wave peak velocity       104  cm/s  --------- Mitral A-wave peak velocity       126  cm/s  --------- Mitral deceleration time     (H)   296  ms   150 - 230 Mitral peak gradient, D         4   mm Hg --------- Mitral E/A ratio, peak          0.8     ---------  Pulmonary arteries            Value    Reference PA pressure, S, DP            29  mm Hg <=30  Tricuspid valve             Value    Reference Tricuspid regurg peak velocity      254  cm/s  --------- Tricuspid peak RV-RA gradient      26  mm Hg ---------  Systemic veins              Value    Reference Estimated CVP              3   mm Hg ---------  Right ventricle             Value    Reference RV pressure, S, DP            29  mm Hg <=30 RV s&', lateral, S            13.8 cm/s  ---------  Legend: (L) and (H) mark values outside specified reference range.  ------------------------------------------------------------------- Prepared and Electronically Authenticated by  Dola Argyle, MD 2015-12-02T10:29:12  PHYSICAL EXAM General: Well developed, elderly female, in no acute distress. Head: Normocephalic, atraumatic, sclera non-icteric, oropharynx is clear Neck: Negative for carotid bruits. JVD 6 cm. No adenopathy Lungs: Clear  bilaterally to auscultation without wheezes, rales, or rhonchi. Breathing is unlabored. Heart: RRR S1 S2 without murmurs, rubs, or gallops.  Abdomen: Soft, non-tender, non-distended with normoactive bowel sounds. No hepatomegaly. No rebound/guarding. No obvious abdominal masses. Msk:  Strength and tone appears normal for age. Extremities: No clubbing, cyanosis or edema.  Distal pedal pulses are 2+ and equal bilaterally. Neuro: Alert and oriented X 3. Moves all extremities spontaneously. Psych:  Responds to questions appropriately with a normal affect.  ASSESSMENT AND PLAN: 1. NSTEMI. Myoview is intermediate to high risk with a fairly large area of inferior and lateral ischemia. Pain free on meds now including IV heparin. Plan LHC today with possible PCI. The procedure and risks were reviewed including but not limited to death, myocardial infarction, stroke, arrythmias, bleeding, transfusion, emergency surgery, dye allergy, or renal dysfunction. The patient voices understanding and is agreeable to proceed.. 2. Dementia 3. HTN 4. Anemia Fe deficient 5. Hyperlipidemia- LDL 131 in December. Will start statin.    Present on Admission:  . NSTEMI (non-ST elevated myocardial infarction) . Anemia . Chronic venous insufficiency b/l lower extremities . Ectropion of left lower eyelid . Essential hypertension . GERD  Signed, Demira Gwynne [place], [place] 08/09/2014 7:43 AM

## 2014-08-09 NOTE — Progress Notes (Signed)
Subjective: No acute events overnight. Pt denies dyspnea at rest, CP, or abd pain. She has been using bed side commode with assistance. She had few questions about her cath procedure today which we explained to her. We had a short discussion about her going to SNF for cardiac rehab afterwards will get help from social work.  Objective: Vital signs in last 24 hours: Filed Vitals:   08/08/14 1342 08/08/14 2100 08/09/14 0356 08/09/14 1036  BP: 150/69 126/56 122/44 141/53  Pulse: 81 63 57   Temp: 97.4 F (36.3 C) 98.5 F (36.9 C) 98.4 F (36.9 C)   TempSrc: Oral Oral Oral   Resp: 18 18 18    Height:      Weight:      SpO2: 100% 99% 99%    Weight change:   Intake/Output Summary (Last 24 hours) at 08/09/14 1319 Last data filed at 08/08/14 1500  Gross per 24 hour  Intake    240 ml  Output      0 ml  Net    240 ml   Physical Exam: Constitutional: Vital signs reviewed. Patient lying in bed in no acute distress and cooperative with exam.  Mouth: no erythema or exudates, MMM Eyes: PERRL, conjunctivae normal, left lower eyelid ectropion Neck: Supple, Trachea midline normal ROM, No JVD Cardiovascular: RRR, S1 normal, S2 normal, no MRG Pulmonary/Chest: normal respiratory effort, CTAB, no wheezes or rales Abdominal: Soft. Non-tender, non-distended, bowel sounds are normal, no masses, organomegaly, or guarding present. Extremities; No edema; bilateral hyperpigmentation in LE (L>R) Neurological: A&O x3 Skin: Warm, dry and intact. Bilateral overgrown toenails. Psychiatric: Normal mood and affect.  Lab Results: Basic Metabolic Panel:  Recent Labs Lab 08/08/14 0045 08/09/14 0548  NA 139 142  K 3.6 3.9  CL 111 113*  CO2 20 25  GLUCOSE 111* 87  BUN 19 18  CREATININE 1.03 1.09  CALCIUM 8.6 8.6   Liver Function Tests:  Recent Labs Lab 08/06/14 0709  AST 21  ALT 11  ALKPHOS 77  BILITOT 0.8  PROT 7.2  ALBUMIN 3.8   CBC:  Recent Labs Lab 08/06/14 0709 08/07/14 0501  08/08/14 0045 08/09/14 0548  WBC 3.9* 7.5 7.1 4.8  NEUTROABS 1.7 6.2  --   --   HGB 10.5* 10.1* 9.5* 9.4*  HCT 31.1* 30.5* 28.7* 28.1*  MCV 92.0 94.7 93.5 94.9  PLT 144* 158 131* 136*   Cardiac Enzymes:  Recent Labs Lab 08/07/14 1347 08/07/14 1825 08/08/14 0045  TROPONINI 0.41* 0.37* 0.31*    Recent Labs Lab 08/07/14 0601  LABPROT 14.2  INR 1.09   Studies/Results: Nm Myocar Multi W/spect W/wall Motion / Ef  08/08/2014   CLINICAL DATA:  Chest pain, hypertension, hyperlipidemia, shortness of breath and elevated troponin levels  EXAM: MYOCARDIAL IMAGING WITH SPECT (REST AND PHARMACOLOGIC-STRESS)  GATED LEFT VENTRICULAR WALL MOTION STUDY  LEFT VENTRICULAR EJECTION FRACTION  TECHNIQUE: Standard myocardial SPECT imaging was performed after resting intravenous injection of 10 mCi Tc-55m sestamibi. Subsequently, intravenous infusion of Lexiscan was performed under the supervision of the Cardiology staff. At peak effect of the drug, 30 mCi Tc-90m sestamibi was injected intravenously and standard myocardial SPECT imaging was performed. Quantitative gated imaging was also performed to evaluate left ventricular wall motion, and estimate left ventricular ejection fraction.  COMPARISON:  None.  FINDINGS: Perfusion: There is evidence of moderate sized perfusion defect of the lateral and inferolateral walls extending roughly from the base of the heart to the mid ventricular level on the  stress study with relatively normal perfusion of this region on the rest study. Findings are consistent with ischemia. No fixed perfusion defects are identified.  Wall Motion: Normal left ventricular wall motion. No left ventricular dilation.  Left Ventricular Ejection Fraction: 70 %  End diastolic volume 55 ml  End systolic volume 16 ml  IMPRESSION: 1. Evidence of inducible myocardial ischemia involving the lateral and inferolateral walls of the left ventricle. The perfusion defect is moderate in size.  2. Normal left  ventricular wall motion.  3. Left ventricular ejection fraction 70%  4. Intermediate-risk stress test findings*.  *2012 Appropriate Use Criteria for Coronary Revascularization Focused Update: J Am Coll Cardiol. 5701;77(9):390-300. http://content.airportbarriers.com.aspx?articleid=1201161   Electronically Signed   By: Aletta Edouard M.D.   On: 08/08/2014 14:44   Portable Chest 1 View  08/07/2014   CLINICAL DATA:  Initial evaluation for chest pain with burning sensation in throat  EXAM: PORTABLE CHEST - 1 VIEW  COMPARISON:  08/06/2014  FINDINGS: Mild cardiac enlargement. Stable aortic calcification. Vascular pattern normal. No consolidation or effusion.  IMPRESSION: No active disease.   Electronically Signed   By: Skipper Cliche M.D.   On: 08/07/2014 17:30   Medications:  Scheduled Meds: . aspirin EC  325 mg Oral Daily  . atorvastatin  20 mg Oral q1800  . bisoprolol  5 mg Oral Daily  . isosorbide mononitrate  30 mg Oral Daily  . losartan  50 mg Oral Daily  . pantoprazole  40 mg Oral Daily  . sodium chloride  3 mL Intravenous Q12H  . sodium chloride  3 mL Intravenous Q12H  . sodium chloride  3 mL Intravenous Q12H   Continuous Infusions: . sodium chloride 1 mL/kg/hr (08/09/14 0349)  . heparin 800 Units/hr (08/08/14 0343)   PRN Meds:.sodium chloride, acetaminophen, albuterol, hydroxypropyl methylcellulose / hypromellose, sodium chloride  Assessment/Plan: Active Problems:   Anemia   Essential hypertension   GERD   Chronic venous insufficiency b/l lower extremities   Ectropion of left lower eyelid   Heart failure with preserved ejection fraction   NSTEMI (non-ST elevated myocardial infarction)   Jane [name] is a 81 y.o. female with PMH significant for CHF (preserved EF), chronic dyspnea, HTN, CKD, anemia, GERD, and anxiety who presented to ED at [facility] for SOB, found to have NSTEMI and was subsequently transferred to our service.  **NSTEMI - Pt's initial sx of SOB at rest  along with IStat Troponin of 0.3, serial down trending troponins of 0.50, 0.41, 0.37, and 0.31 without ischemic changes on EKG suggest most likely NSTEMI. Pt's vitals are stable and given the recent negative chest CT Angio, PE is less likely to be the cause of her SOB. NST showed evidence of lateral and inferolateral ischemia on the left ventricle; EF of 70%; no wall motion abnormality. Plan for PCI today. - NPO since midnight for Cath today - Continuous telemetry - Cardiology following, will appreciate additional recs - Heparin drip - ASA 81mg  qd - Atorvastatin 20mg  qd - Isosorbide mononitrate 30mg  qd - Hold home metoprolol succinate 100mg  qd and start bisoprolol 5mg  qd - Losartan 50mg  qd - Risk stratification (TSH, Lipid panel, Hgb A1c)  **CHF - Pt has a baseline orthopnea requiring two pillows at night. She appears euvolemic today. Her sx are less likely to be from acute CHF exacerbation but more likely from CAD. Last 2D Echo on 06/01/14 showed mild LVH with EF of 60%, no wall motion abnormalities, mild aortic regurg. Currently satting at 99%  on RA. - ASA 81mg  - Llosartan 75mg  qd - Bisoprolol 5mg  qd as above - Hold home lasix 20mg  BID until cath is completed  **HTN - BP elevated this morning to 141/53. Will continue home regimen - Monitor vitals q6h - Losartan 75mg  qd - Bisoprolol 5mg  qd as above - Isosorbide mononitrate 30mg  qd - Hold home lasix 20mg  BID as above  **CKD - SCr at 1.09; GFR of 53. Pt is stable  - Will monitor sx and BMP  **Anemia - Hgb 10.1 - > 9.5 -> 9.4; HCT 30.5 -> 28.7 -> 28.1; MCV 94.9 which seems to be around her baseline. Normocytic anemia but on iron supplements. Pt denies any overt bleeding or epistaxis. Pt is hemodynamically stable. - Monitor CBC for Hgb and HCT trends - Hold home ferrous sulfate 325mg  for now - Will order anemia labs if necessary  **GERD - Her throat burning sx could be potentially GI related - Continue home protonix 40mg  qd  **COPD -  former smoker, her chronic dyspnea could be baseline due to lung disease. CXR was negative. - Albuterol inhaler 2 puffs prn  **Left eyelid ectropion - Continue home Isopto tears 2.5% QID  **Anxiety - Pt endorses hx of anxiety and panic attacks, but currently not on any medications. Will continue to monitor her for sx.  Dispo: Disposition is deferred at this time, awaiting improvement of current medical problems. Anticipated discharge in approximately 2-3 day(s).   The patient does have a current PCP Luan Moore, MD) and does need an [facility] hospital follow-up appointment after discharge.  The patient does have transportation limitations that hinder transportation to clinic appointments.   Services Needed at time of discharge: Y = Yes, Blank = No PT:  Y  OT:  Y  RN:   Equipment:   Other:      LOS: 2 days   This is a Careers information officer Note. The care of the patient was discussed with Dr. Redmond Pulling and the assessment and plan was formulated with their assistance. Please see their note for official documentation of the patient encounter.  Signed: Lawana Chambers, Med Student Internal Medicine Teaching Service Team B2  [phone] 08/09/2014, 1:12 PM

## 2014-08-09 NOTE — Progress Notes (Signed)
Occupational Therapy Treatment Patient Details Name: Jane [name] MRN: [id number] DOB: 26-Oct-1928 Today's Date: 08/09/2014    History of present illness [name] is an 81 yo woman with a history of chronic dsypnea, HFpEF, GERD, hypertension and anemia who presented with chest pain, shortness of breath at rest and has been found to have an NSTEMI with a troponin elevated to 0.50.   OT comments  Pt making progress with functional goals and should continue with acute OT services to address impairments to increase level of function and safety. Pt states that she still prefers to d/c home instead of SNF for rehab but understands that short term rehab would benefit her since she lives at home alone and with no assistance  Follow Up Recommendations  SNF;Supervision/Assistance - 24 hour    Equipment Recommendations  Other (comment) (TBD at next venue of care, ADL A/E)    Recommendations for Other Services      Precautions / Restrictions Precautions Precautions: Fall Restrictions Weight Bearing Restrictions: No       Mobility Bed Mobility Overal bed mobility: Needs Assistance Bed Mobility: Rolling;Sidelying to Sit Rolling: Supervision Sidelying to sit: Supervision       General bed mobility comments: using bed rails   Transfers Overall transfer level: Needs assistance Equipment used: Rolling walker (2 wheeled) Transfers: Sit to/from Stand Sit to Stand: Min assist              Balance   Sitting-balance support: No upper extremity supported;Feet supported Sitting balance-Leahy Scale: Fair     Standing balance support: Bilateral upper extremity supported;During functional activity;Single extremity supported Standing balance-Leahy Scale: Poor                     ADL       Grooming: Min guard;Standing   Upper Body Bathing: Sitting;Min guard   Lower Body Bathing: Maximal assistance;Sit to/from stand   Upper Body Dressing : Sitting;Min guard      Lower Body Dressing Details (indicate cue type and reason): mod A to donn socks due to decreased sitting balance at EOB Toilet Transfer: Ambulation;BSC;RW;Minimal assistance;Cueing for safety           Functional mobility during ADLs: Min guard;Rolling walker;Minimal assistance General ADL Comments: pt educated on use of  A/E for LB ADLs. pt provided with demo. Pt states that she woud like to return home but understands that she may have to d/c to short term rehab at [facility]                                      Cognition   Behavior During Therapy: [facility] for tasks assessed/performed Overall Cognitive Status: Within Functional Limits for tasks assessed                       Extremity/Trunk Assessment   WFL, generalized weakness                        General Comments  Pt pleasant and cooperative    Pertinent Vitals/ Pain       Pain Assessment: No/denies pain  Home Living  pt lives at home alone  Prior Functioning/Environment  Ind           Frequency Min 2X/week     Progress Toward Goals  OT Goals(current goals can now be found in the care plan section)  Progress towards OT goals: Progressing toward goals  Acute Rehab OT Goals Patient Stated Goal: go home  Plan Discharge plan remains appropriate                     End of Session Equipment Utilized During Treatment: Gait belt;Rolling walker;Other (comment) (BSC, ADL A/E)   Activity Tolerance Patient tolerated treatment well   Patient Left with call bell/phone within reach;in bed;with nursing/sitter in room;Other (comment) (sitting EOB with nurse)             Time: 6761-9509 OT Time Calculation (min): 29 min  Charges: OT General Charges $OT Visit: 1 Procedure OT Treatments $Self Care/Home Management : 8-22 mins $Therapeutic Activity: 8-22 mins  Britt Bottom 08/09/2014, 1:59 PM

## 2014-08-10 ENCOUNTER — Encounter: Payer: Self-pay | Admitting: Physician Assistant

## 2014-08-10 DIAGNOSIS — I359 Nonrheumatic aortic valve disorder, unspecified: Secondary | ICD-10-CM | POA: Diagnosis not present

## 2014-08-10 DIAGNOSIS — I509 Heart failure, unspecified: Secondary | ICD-10-CM

## 2014-08-10 DIAGNOSIS — H02109 Unspecified ectropion of unspecified eye, unspecified eyelid: Secondary | ICD-10-CM | POA: Diagnosis not present

## 2014-08-10 DIAGNOSIS — K219 Gastro-esophageal reflux disease without esophagitis: Secondary | ICD-10-CM | POA: Diagnosis not present

## 2014-08-10 DIAGNOSIS — E785 Hyperlipidemia, unspecified: Secondary | ICD-10-CM | POA: Diagnosis not present

## 2014-08-10 DIAGNOSIS — I10 Essential (primary) hypertension: Secondary | ICD-10-CM | POA: Diagnosis not present

## 2014-08-10 DIAGNOSIS — H02105 Unspecified ectropion of left lower eyelid: Secondary | ICD-10-CM | POA: Diagnosis not present

## 2014-08-10 DIAGNOSIS — D638 Anemia in other chronic diseases classified elsewhere: Secondary | ICD-10-CM | POA: Diagnosis not present

## 2014-08-10 DIAGNOSIS — H02106 Unspecified ectropion of left eye, unspecified eyelid: Secondary | ICD-10-CM | POA: Diagnosis not present

## 2014-08-10 DIAGNOSIS — R0609 Other forms of dyspnea: Secondary | ICD-10-CM | POA: Diagnosis not present

## 2014-08-10 DIAGNOSIS — I240 Acute coronary thrombosis not resulting in myocardial infarction: Secondary | ICD-10-CM | POA: Diagnosis not present

## 2014-08-10 DIAGNOSIS — F0390 Unspecified dementia without behavioral disturbance: Secondary | ICD-10-CM | POA: Diagnosis not present

## 2014-08-10 DIAGNOSIS — F419 Anxiety disorder, unspecified: Secondary | ICD-10-CM | POA: Diagnosis not present

## 2014-08-10 DIAGNOSIS — I214 Non-ST elevation (NSTEMI) myocardial infarction: Secondary | ICD-10-CM | POA: Diagnosis not present

## 2014-08-10 DIAGNOSIS — D649 Anemia, unspecified: Secondary | ICD-10-CM | POA: Diagnosis not present

## 2014-08-10 DIAGNOSIS — Z7982 Long term (current) use of aspirin: Secondary | ICD-10-CM | POA: Diagnosis not present

## 2014-08-10 DIAGNOSIS — D509 Iron deficiency anemia, unspecified: Secondary | ICD-10-CM | POA: Diagnosis not present

## 2014-08-10 DIAGNOSIS — N179 Acute kidney failure, unspecified: Secondary | ICD-10-CM | POA: Diagnosis not present

## 2014-08-10 DIAGNOSIS — L089 Local infection of the skin and subcutaneous tissue, unspecified: Secondary | ICD-10-CM | POA: Diagnosis not present

## 2014-08-10 DIAGNOSIS — N183 Chronic kidney disease, stage 3 (moderate): Secondary | ICD-10-CM | POA: Diagnosis not present

## 2014-08-10 DIAGNOSIS — M6281 Muscle weakness (generalized): Secondary | ICD-10-CM | POA: Diagnosis not present

## 2014-08-10 DIAGNOSIS — I872 Venous insufficiency (chronic) (peripheral): Secondary | ICD-10-CM

## 2014-08-10 DIAGNOSIS — M79674 Pain in right toe(s): Secondary | ICD-10-CM | POA: Diagnosis not present

## 2014-08-10 DIAGNOSIS — Z87891 Personal history of nicotine dependence: Secondary | ICD-10-CM | POA: Diagnosis not present

## 2014-08-10 DIAGNOSIS — I6789 Other cerebrovascular disease: Secondary | ICD-10-CM | POA: Diagnosis not present

## 2014-08-10 DIAGNOSIS — L989 Disorder of the skin and subcutaneous tissue, unspecified: Secondary | ICD-10-CM | POA: Diagnosis not present

## 2014-08-10 DIAGNOSIS — J449 Chronic obstructive pulmonary disease, unspecified: Secondary | ICD-10-CM | POA: Diagnosis not present

## 2014-08-10 DIAGNOSIS — I110 Hypertensive heart disease with heart failure: Secondary | ICD-10-CM | POA: Diagnosis not present

## 2014-08-10 DIAGNOSIS — M79675 Pain in left toe(s): Secondary | ICD-10-CM | POA: Diagnosis not present

## 2014-08-10 DIAGNOSIS — J439 Emphysema, unspecified: Secondary | ICD-10-CM | POA: Diagnosis not present

## 2014-08-10 DIAGNOSIS — M1990 Unspecified osteoarthritis, unspecified site: Secondary | ICD-10-CM | POA: Diagnosis not present

## 2014-08-10 DIAGNOSIS — Z8673 Personal history of transient ischemic attack (TIA), and cerebral infarction without residual deficits: Secondary | ICD-10-CM | POA: Diagnosis not present

## 2014-08-10 DIAGNOSIS — B351 Tinea unguium: Secondary | ICD-10-CM | POA: Diagnosis not present

## 2014-08-10 DIAGNOSIS — I2510 Atherosclerotic heart disease of native coronary artery without angina pectoris: Secondary | ICD-10-CM | POA: Diagnosis not present

## 2014-08-10 LAB — BASIC METABOLIC PANEL
ANION GAP: 4 — AB (ref 5–15)
BUN: 14 mg/dL (ref 6–23)
CHLORIDE: 114 mmol/L — AB (ref 96–112)
CO2: 23 mmol/L (ref 19–32)
Calcium: 8.5 mg/dL (ref 8.4–10.5)
Creatinine, Ser: 1.13 mg/dL — ABNORMAL HIGH (ref 0.50–1.10)
GFR calc non Af Amer: 44 mL/min — ABNORMAL LOW (ref >90)
GFR, EST AFRICAN AMERICAN: 51 mL/min — AB (ref >90)
Glucose, Bld: 119 mg/dL — ABNORMAL HIGH (ref 70–99)
Potassium: 4.4 mmol/L (ref 3.5–5.1)
Sodium: 141 mmol/L (ref 135–145)

## 2014-08-10 LAB — CBC
HEMATOCRIT: 28.3 % — AB (ref 36.0–46.0)
HEMOGLOBIN: 9.5 g/dL — AB (ref 12.0–15.0)
MCH: 31.1 pg (ref 26.0–34.0)
MCHC: 33.6 g/dL (ref 30.0–36.0)
MCV: 92.8 fL (ref 78.0–100.0)
Platelets: 149 10*3/uL — ABNORMAL LOW (ref 150–400)
RBC: 3.05 MIL/uL — AB (ref 3.87–5.11)
RDW: 13.6 % (ref 11.5–15.5)
WBC: 4.8 10*3/uL (ref 4.0–10.5)

## 2014-08-10 MED ORDER — SODIUM CHLORIDE 0.9 % IV SOLN
INTRAVENOUS | Status: DC
  Administered 2014-08-10: 12:00:00 via INTRAVENOUS

## 2014-08-10 MED ORDER — ISOSORBIDE MONONITRATE ER 30 MG PO TB24: 30.0000 mg | ORAL_TABLET | Freq: Every day | ORAL | Status: DC

## 2014-08-10 MED ORDER — TICAGRELOR 90 MG PO TABS: 90.0000 mg | ORAL_TABLET | Freq: Two times a day (BID) | ORAL | Status: DC

## 2014-08-10 MED ORDER — ATORVASTATIN CALCIUM 20 MG PO TABS: 20.0000 mg | ORAL_TABLET | Freq: Every day | ORAL | Status: DC

## 2014-08-10 MED ORDER — BISOPROLOL FUMARATE 5 MG PO TABS: 5.0000 mg | ORAL_TABLET | Freq: Every day | ORAL | Status: DC

## 2014-08-10 MED FILL — Sodium Chloride IV Soln 0.9%: INTRAVENOUS | Qty: 50 | Status: AC

## 2014-08-10 NOTE — Progress Notes (Signed)
Subjective: No acute events overnight. Pt was lying comfortably in bed. Denies SOB or CP. Had a discussion with pt about adhering to her medications without missing any doses now that she has a DES in her RCA. We also discussed that a Education officer, museum will help her get placed in the right facility and address the need for rehab s/p PCI.  Objective: Vital signs in last 24 hours: Filed Vitals:   08/10/14 0500 08/10/14 0544 08/10/14 0804 08/10/14 1153  BP:  119/63 128/39 159/65  Pulse:  62 66 65  Temp:  97.8 F (36.6 C) 97.8 F (36.6 C) 97.8 F (36.6 C)  TempSrc:  Oral Oral Oral  Resp:  20 18 18   Height:      Weight: 75.3 kg (166 lb 0.1 oz)     SpO2:  98% 99% 99%    Intake/Output Summary (Last 24 hours) at 08/10/14 1311 Last data filed at 08/10/14 0916  Gross per 24 hour  Intake    360 ml  Output   1200 ml  Net   -840 ml   Physical Exam: Constitutional: Vital signs reviewed. Patient lying in bed in no acute distress and cooperative with exam.  Mouth: no erythema or exudates, MMM Eyes: PERRL, conjunctivae normal, left lower eyelid ectropion Neck: Supple, Trachea midline normal ROM, No JVD Cardiovascular: RRR, S1 normal, S2 normal, no MRG Pulmonary/Chest: normal respiratory effort, CTAB, no wheezes or rales Abdominal: Soft. Non-tender, non-distended, bowel sounds are normal, no masses, organomegaly, or guarding present. Extremities; No edema; bilateral hyperpigmentation in LE (L>R) Neurological: A&O x3 Skin: Warm, dry and intact. Bilateral overgrown toenails. Psychiatric: Normal mood and affect.  Lab Results: Basic Metabolic Panel:  Recent Labs Lab 08/09/14 0548 08/10/14 0426  NA 142 141  K 3.9 4.4  CL 113* 114*  CO2 25 23  GLUCOSE 87 119*  BUN 18 14  CREATININE 1.09 1.13*  CALCIUM 8.6 8.5   Liver Function Tests:  Recent Labs Lab 08/06/14 0709  AST 21  ALT 11  ALKPHOS 77  BILITOT 0.8  PROT 7.2  ALBUMIN 3.8   CBC:  Recent Labs Lab 08/06/14 0709  08/07/14 0501  08/09/14 0548 08/10/14 0426  WBC 3.9* 7.5  < > 4.8 4.8  NEUTROABS 1.7 6.2  --   --   --   HGB 10.5* 10.1*  < > 9.4* 9.5*  HCT 31.1* 30.5*  < > 28.1* 28.3*  MCV 92.0 94.7  < > 94.9 92.8  PLT 144* 158  < > 136* 149*  < > = values in this interval not displayed. Cardiac Enzymes:  Recent Labs Lab 08/07/14 1347 08/07/14 1825 08/08/14 0045  TROPONINI 0.41* 0.37* 0.31*   Coagulation:  Recent Labs Lab 08/07/14 0601  LABPROT 14.2  INR 1.09   Medications:  Scheduled Meds: . aspirin  81 mg Oral Daily  . atorvastatin  20 mg Oral q1800  . bisoprolol  5 mg Oral Daily  . isosorbide mononitrate  30 mg Oral Daily  . losartan  50 mg Oral Daily  . pantoprazole  40 mg Oral Daily  . sodium chloride  3 mL Intravenous Q12H  . sodium chloride  3 mL Intravenous Q12H  . ticagrelor  90 mg Oral BID   Continuous Infusions: . sodium chloride 75 mL/hr at 08/10/14 1200   PRN Meds:.acetaminophen, albuterol, alum & mag hydroxide-simeth, hydroxypropyl methylcellulose / hypromellose, ondansetron (ZOFRAN) IV  Assessment/Plan: Active Problems:   Anemia   Essential hypertension   GERD  Chronic venous insufficiency b/l lower extremities   Ectropion of left lower eyelid   Heart failure with preserved ejection fraction   NSTEMI (non-ST elevated myocardial infarction)   Jane [name] is a 81 y.o. female with PMH significant for CHF (preserved EF), chronic dyspnea, HTN, CKD, anemia, GERD, and anxiety who presented to ED at [facility] for SOB, found to have NSTEMI and was subsequently transferred to our service.  **NSTEMI - Pt's initial sx of SOB at rest along with IStat Troponin of 0.3, serial down trending troponins of 0.50, 0.41, 0.37, and 0.31 without ischemic changes on EKG suggest most likely NSTEMI. Pt's vitals are stable and given the recent negative chest CT Angio, PE is less likely to be the cause of her SOB. NST showed evidence of lateral and inferolateral ischemia on the  left ventricle; EF of 70%; no wall motion abnormality. Cath showed 2 vessel obstructive CAD: LCX with 2 OM branches - OM 1 showing proximal occlusion, along with OM 2 showing 80-90% proximal stenosis; RCA with 95 % stenosis; LAD with 50% stenosis. Pt underwent successful stenting of RCA with a Synergy DES. Pt doing well this morning and stable for discharge per Cardiology. - D/c to [facility] SN[facility] for cardiac rehab - ASA 81mg  qd - Atorvastatin 20mg  qd - Isosorbide mononitrate 30mg  qd - Bisoprolol 5mg  qd - Losartan 50mg  qd - Start Brilinta 90mg  BID  **CHF - Pt has a baseline orthopnea requiring two pillows at night. She appears euvolemic today. Her sx are less likely to be from acute CHF exacerbation but more likely from CAD. Last 2D Echo on 06/01/14 showed mild LVH with EF of 60%, no wall motion abnormalities, mild aortic regurg. Currently satting at 99% on RA. - ASA 81mg  - Losartan 50mg  qd - Bisoprolol 5mg  qd as above - Continue home lasix 20mg  BID upon discharge  **HTN - BP elevated this morning to 141/53. Will continue home regimen - Losartan 50mg  qd - Bisoprolol 5mg  qd as above - Isosorbide mononitrate 30mg  qd - Continue home lasix 20mg  BID  **CKD - SCr at 1.13; GFR of 51. Pt is stable  - Will monitor sx and BMP  **Anemia - Hgb 10.1 - > 9.5 -> 9.4; HCT 30.5 -> 28.7 -> 28.1 -> 28.3; MCV 92.8 which seems to be around her baseline. Normocytic anemia but on iron supplements. Pt denies any overt bleeding or epistaxis. Pt is hemodynamically stable. - Monitor CBC for Hgb and HCT trends - Continue home ferrous sulfate 325mg  upon discharge  **GERD - Her throat burning sx could be potentially GI related - Continue home protonix 40mg  qd  **COPD - former smoker, her chronic dyspnea could be baseline due to lung disease. CXR was negative. - Albuterol inhaler 2 puffs prn  **Left eyelid ectropion - Continue home Isopto tears 2.5% QID  **Anxiety - Pt endorses hx of anxiety and panic attacks,  but currently not on any medications. Will continue to monitor her for sx.  Dispo: This afternoon to [facility].  The patient does have a current PCP Luan Moore, MD) and does need an [facility] hospital follow-up appointment after discharge.  The patient does have transportation limitations that hinder transportation to clinic appointments.   Services Needed at time of discharge: Y = Yes, Blank = No PT:  Y  OT:  Y  RN:  Y  Equipment:   Other:      LOS: 3 days   This is a Careers information officer Note. The care of  the patient was discussed with Dr. Redmond Pulling and the assessment and plan was formulated with their assistance. Please see their note for official documentation of the patient encounter.  Signed: Lawana Chambers, Med Student Internal Medicine Teaching Service Team B2  [phone] 08/10/2014, 1:05 PM
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
## 2014-08-10 NOTE — Discharge Summary (Deleted)
Physician Discharge Summary  Patient ID: Jane [name] MRN: [id number] DOB/AGE: 1929-01-09 81 y.o.  Admit date: 08/07/2014 Discharge date: 08/10/2014  Admission Diagnoses: NSTEMI  Discharge Diagnoses: NSTEMI  Hospital Course: #NSTEMI - Pt presented with acute on chronic dyspnea at rest to [facility] ED. Found to have elevated troponins to 0.5 without ischemic changes on EKG. CXR was negative. Pt was admitted to [facility] inpatient service for NSTEMI eval and treatment. NST showed moderate perfusion defect in the lateral and inferolateral walls with no wall motion abnormalities along with EF of 70%. The following day, pt underwent PCI with the following diagnostic findings: Severe 2 vessel obstructive CAD; LAD with 50% stenosis; OM 1 occluded proximally; OM 2 with 80-90% stenosis; RCA with 95% stenosis. Additionally, she received intervention with successful stenting of RCA with Synergy DES. The residual disease in OM 1 and 2 will be managed medically. Pt was kept overnight for observation and then discharged the following afternoon to [facility] in stable condition. She was placed on DAPT therapy (ASA and Brilinta) for one year. She was also started on atorvastatin 20mg .  #HTN - Pt's baseline BP during this admission was 140-150s/50s. Her Lasix was held during this admission due to her receiving contrast during cardiac procedures. Her SCr on the day of discharge was 1.13. During this admission, she was switched from her home metoprolol to bisoprolol and started on Imdur. Upon discharge to SNF she is to resume Losartan 50mg .  #HFpEF - Stable; continue home meds  #Anemia - Stable; continue home ferrous sulfate 325mg   #GERD - Stable; continue home omeprazole 20mg   #COPD - Stable; continue home albuterol prn  #Left eyelid ectropion - Stable; continue home Isopto tears  Pt was able to have a BM on the day of discharge along with meeting all goals and in stable condition. She will be admitted  to [facility] for cardiac rehab.  Pt is to continue all her home meds prior to admission except for the following changes: STOP: Metoprolol succinate 100mg  START: Brilinta 90mg , Imdur 30mg , atorvastatin 20mg , and bisoprolol 5mg   Pt has a follow up appointment scheduled with Dr. Luan Moore on 08/18/2014 at 01:45 PM in Internal Medicine clinic.  Discharge Exam: Blood pressure 159/65, pulse 65, temperature 97.8 F (36.6 C), temperature source Oral, resp. rate 18, height 5\' 4"  (1.626 m), weight 75.3 kg (166 lb 0.1 oz), SpO2 99 %.  Disposition: [facility]     Medication List    ASK your doctor about these medications        acetaminophen 325 MG tablet  Commonly known as:  TYLENOL  Take 650 mg by mouth every 6 (six) hours as needed for mild pain.     albuterol 108 (90 BASE) MCG/ACT inhaler  Commonly known as:  PROVENTIL HFA;VENTOLIN HFA  Inhale 2 puffs into the lungs every 4 (four) hours as needed for wheezing or shortness of breath.     aspirin EC 81 MG tablet  Take 81 mg by mouth daily.     ferrous sulfate 325 (65 FE) MG tablet  take 1 tablet by mouth twice a day     furosemide 20 MG tablet  Commonly known as:  LASIX  Take 20 mg by mouth 2 (two) times daily.     hydroxypropyl methylcellulose / hypromellose 2.5 % ophthalmic solution  Commonly known as:  ISOPTO TEARS / GONIOVISC  Place 1 drop into the left eye 4 (four) times daily as needed for dry eyes.  losartan 50 MG tablet  Commonly known as:  COZAAR  Take 1.5 tablets (75 mg total) by mouth daily.     metoprolol succinate 100 MG 24 hr tablet  Commonly known as:  TOPROL-XL  Take 1 tablet (100 mg total) by mouth daily. Take with or immediately following a meal.     omeprazole 20 MG capsule  Commonly known as:  PRILOSEC  Take 1 capsule (20 mg total) by mouth 2 (two) times daily before a meal.     potassium chloride SA 20 MEQ tablet  Commonly known as:  K-DUR,KLOR-CON  take 1 tablet by mouth once daily        Follow-up Information    Follow up with HAGER, BRYAN, PA-C On 08/24/2014.   Specialty:  Physician Assistant   Why:  3:30pm   Contact information:   [address] [place] [address] [phone]       Signed: Lawana Chambers 08/10/2014, 2:07 PM

## 2014-08-10 NOTE — Discharge Summary (Signed)
Patient Name:  Jane [name]  MRN: [id number]  PCP: Luan Moore, MD  DOB:  03/27/1929       Date of Admission:  08/07/2014  Date of Discharge:  08/10/2014      Attending Physician: Dr. Thayer Headings, MD         DISCHARGE DIAGNOSES: 1.   NSTEMI (non-ST elevated myocardial infarction 2.   Essential hypertension 3.   GERD 4.   Chronic venous insufficiency b/l lower extremities 5.   Ectropion of left lower eyelid 6.   Heart failure with preserved ejection fraction 7.   Anemia    DISPOSITION AND FOLLOW-UP: Jane [name] is to follow-up with the listed providers as detailed below, at patient's visiting, please address following issues:  1) please check BMP  Because creatinine increased to 1.13 after cath.   2) please check BP; she will need to resume losartan and lasix once renal function back to baseline 3) compliance with new meds including Brilinta  Follow-up Information    Follow up with HAGER, BRYAN, PA-C On 08/24/2014.   Specialty:  Physician Assistant   Why:  3:30pm   Contact information:   [address] [place] [address] [phone]       Follow up with Luan Moore, MD On 08/18/2014.   Specialty:  Internal Medicine   Why:  for hospital follow-up   Contact information:   [place] [address] [phone]         DISCHARGE MEDICATIONS:   Medication List    STOP taking these medications        furosemide 20 MG tablet  Commonly known as:  LASIX     losartan 50 MG tablet  Commonly known as:  COZAAR     metoprolol succinate 100 MG 24 hr tablet  Commonly known as:  TOPROL-XL      TAKE these medications        acetaminophen 325 MG tablet  Commonly known as:  TYLENOL  Take 650 mg by mouth every 6 (six) hours as needed for mild pain.     albuterol 108 (90 BASE) MCG/ACT inhaler  Commonly known as:  PROVENTIL HFA;VENTOLIN HFA  Inhale 2 puffs into the lungs every 4 (four) hours as needed for wheezing or shortness of  breath.     aspirin EC 81 MG tablet  Take 81 mg by mouth daily.     atorvastatin 20 MG tablet  Commonly known as:  LIPITOR  Take 1 tablet (20 mg total) by mouth daily at 6 PM.     bisoprolol 5 MG tablet  Commonly known as:  ZEBETA  Take 1 tablet (5 mg total) by mouth daily.     ferrous sulfate 325 (65 FE) MG tablet  take 1 tablet by mouth twice a day     hydroxypropyl methylcellulose / hypromellose 2.5 % ophthalmic solution  Commonly known as:  ISOPTO TEARS / GONIOVISC  Place 1 drop into the left eye 4 (four) times daily as needed for dry eyes.     isosorbide mononitrate 30 MG 24 hr tablet  Commonly known as:  IMDUR  Take 1 tablet (30 mg total) by mouth daily.     omeprazole 20 MG capsule  Commonly known as:  PRILOSEC  Take 1 capsule (20 mg total) by mouth 2 (two) times daily before a meal.     potassium chloride SA 20 MEQ tablet  Commonly known as:  K-DUR,KLOR-CON  take  1 tablet by mouth once daily     ticagrelor 90 MG Tabs tablet  Commonly known as:  BRILINTA  Take 1 tablet (90 mg total) by mouth 2 (two) times daily.         CONSULTS:    Cardiology   PROCEDURES PERFORMED:  Nm Myocar Multi W/spect W/wall Motion / Ef  08/08/2014   CLINICAL DATA:  Chest pain, hypertension, hyperlipidemia, shortness of breath and elevated troponin levels  EXAM: MYOCARDIAL IMAGING WITH SPECT (REST AND PHARMACOLOGIC-STRESS)  GATED LEFT VENTRICULAR WALL MOTION STUDY  LEFT VENTRICULAR EJECTION FRACTION  TECHNIQUE: Standard myocardial SPECT imaging was performed after resting intravenous injection of 10 mCi Tc-24m sestamibi. Subsequently, intravenous infusion of Lexiscan was performed under the supervision of the Cardiology staff. At peak effect of the drug, 30 mCi Tc-57m sestamibi was injected intravenously and standard myocardial SPECT imaging was performed. Quantitative gated imaging was also performed to evaluate left ventricular wall motion, and estimate left ventricular ejection fraction.   COMPARISON:  None.  FINDINGS: Perfusion: There is evidence of moderate sized perfusion defect of the lateral and inferolateral walls extending roughly from the base of the heart to the mid ventricular level on the stress study with relatively normal perfusion of this region on the rest study. Findings are consistent with ischemia. No fixed perfusion defects are identified.  Wall Motion: Normal left ventricular wall motion. No left ventricular dilation.  Left Ventricular Ejection Fraction: 70 %  End diastolic volume 55 ml  End systolic volume 16 ml  IMPRESSION: 1. Evidence of inducible myocardial ischemia involving the lateral and inferolateral walls of the left ventricle. The perfusion defect is moderate in size.  2. Normal left ventricular wall motion.  3. Left ventricular ejection fraction 70%  4. Intermediate-risk stress test findings*.  *2012 Appropriate Use Criteria for Coronary Revascularization Focused Update: J Am Coll Cardiol. 8416;60(6):301-601. http://content.airportbarriers.com.aspx?articleid=1201161   Electronically Signed   By: Aletta Edouard M.D.   On: 08/08/2014 14:44   Portable Chest 1 View  08/07/2014   CLINICAL DATA:  Initial evaluation for chest pain with burning sensation in throat  EXAM: PORTABLE CHEST - 1 VIEW  COMPARISON:  08/06/2014  FINDINGS: Mild cardiac enlargement. Stable aortic calcification. Vascular pattern normal. No consolidation or effusion.  IMPRESSION: No active disease.   Electronically Signed   By: Skipper Cliche M.D.   On: 08/07/2014 17:30      ADMISSION DATA: H&P: [name] is an 81 yo woman with a history of chronic dyspnea, HFpEF, GERD, hypertension, anxiety, anemia and mild dementia who presented to the [facility] ED with dyspnea early this morning. She typically visits the ED frequently with the same complaint, including a visit on 08/06/14 (the day prior to admission), but today, she had noticed shortness of breath at rest. She also noted a burning sensation in her  throat with no radiation and some right shoulder pain. She has chronic left hand and arm numbness, but she denies nausea, vomiting, diaphoresis or palpitations.  At her ED visit on the day prior to admission, her shortness of breath was found to be exertional and her delta troponin was negative.  Physical Exam: Blood pressure 119/63, pulse 87, temperature 98.1 F (36.7 C), temperature source Oral, resp. rate 16, height 5\' 5"  (1.651 m), weight 168 lb 3.4 oz (76.3 kg), SpO2 100 %. Appearance: in NAD, slightly disheveled, bonnet on head HEENT: AT/Marcus, ectropion OS (with sliver of corneal exposure when patient rests eyes), tearing OU, PERRL, EOMi, no lymphadenopathy Heart: RRR,  normal S1S2, no MRG Lungs: CTAB, no wheezes Abdomen: BS+, soft, nontender, no organomegaly Musculoskeletal: no joint swelling Extremities: extensive chronic venous change in R>L lower extremity Neurologic: A&Ox3, some difficulty following questions, grossly intact Skin: SKs on top of head Physical: appears anxious during conversation  Labs: Basic Metabolic Panel:  Recent Labs (last 2 labs)      Recent Labs  08/06/14 0709 08/07/14 0501  NA 140 143  K 3.2* 4.1  CL 110 115*  CO2 21 22  GLUCOSE 115* 100*  BUN 19 24*  CREATININE 1.16* 1.07  CALCIUM 9.1 9.4     Liver Function Tests:  Recent Labs (last 2 labs)      Recent Labs  08/06/14 0709  AST 21  ALT 11  ALKPHOS 77  BILITOT 0.8  PROT 7.2  ALBUMIN 3.8     CBC:  Recent Labs (last 2 labs)      Recent Labs  08/06/14 0709 08/07/14 0501  WBC 3.9* 7.5  NEUTROABS 1.7 6.2  HGB 10.5* 10.1*  HCT 31.1* 30.5*  MCV 92.0 94.7  PLT 144* 158     Cardiac Enzymes:  Recent Labs (last 2 labs)      Recent Labs  08/06/14 0709 08/06/14 0936 08/07/14 0501  TROPONINI <0.03 0.03 0.50*     Coagulation:  Recent Labs (last 2 labs)      Recent Labs  08/07/14 0601  LABPROT 14.2    INR 1.09         HOSPITAL COURSE:  Hospital Course: NSTEMI:  The patient presented with acute on chronic dyspnea at rest to [facility] ED.  She was found to have elevated troponins to 0.5 without ischemic changes on EKG.  CXR was negative.  Because [facility] is the patient's primary care provider, she was transferred to [facility] for further evaluation of her NSTEMI.  NST showed moderate perfusion defect in the lateral and inferolateral walls with no wall motion abnormalities along with EF of 70%. The following day, pt underwent PCI with the following diagnostic findings: Severe 2 vessel obstructive CAD; LAD with 50% stenosis; OM 1 occluded proximally; OM 2 with 80-90% stenosis; RCA with 95% stenosis. Additionally, she received intervention with successful stenting of RCA with Synergy DES. The residual disease in OM 1 and 2 will be managed medically. She was kept overnight for observation and then discharged the following afternoon to [facility] in stable condition. She was placed on DAPT therapy (ASA and Brilinta) for one year. She was also started on atorvastatin 20mg  daily at discharge.  HTN:  Her baseline BP during this admission was 140-150s/50s. Her Lasix was held during this admission due to her receiving contrast during cardiac procedures. Her SCr on the day of discharge was 1.13 (a slight increase from baseline of 1). During this admission, she was switched from her home metoprolol to bisoprolol and started on Imdur. Upon discharge to SNF she is to resume Losartan 50mg  and Lasix 20mg  BID after checking her renal function with a BMP.  AKI:  Creatinine 1.13.  Will continue to hold losartan and lasix at discharge.  Please check BMP in the next 1-2 days and restart losartan and lasix at that time if renal function is back to baseline.   HFpEF:   Stable; continue home meds  Anemi:  Stable; continue home ferrous sulfate 325mg   GERD:  Stable; continue home omeprazole 20mg   COPD:  Stable;  continue home albuterol prn  Left eyelid ectropion:  Stable; continue home Isopto  tears.  Pt was able to have a BM on the day of discharge along with meeting all goals and in stable condition. She will be admitted to [facility] for cardiac rehab.  Pt is to continue all her home meds prior to admission except for the following changes: STOP: Metoprolol succinate 100mg  HOLD:  Lasix 20mg  BID and Losartan 50mg  daily until BMP is checked in 1-2 days at nursing facility; please resume if creatinine has improved to near baseline of 1.00. START: Brilinta 90mg  BID, Imdur 30mg  daily, atorvastatin 20mg  daily and bisoprolol 5mg  daily  Pt has a follow up appointment scheduled with Dr. Luan Moore on 08/18/2014 at 01:45 PM in Internal Medicine clinic.   DISCHARGE DATA: Vital Signs: BP 159/65 mmHg  Pulse 65  Temp(Src) 97.8 F (36.6 C) (Oral)  Resp 18  Ht 5\' 4"  (1.626 m)  Wt 75.3 kg (166 lb 0.1 oz)  BMI 28.48 kg/m2  SpO2 99%  Labs: Results for orders placed or performed during the hospital encounter of 08/07/14 (from the past 24 hour(s))  CBC     Status: Abnormal   Collection Time: 08/10/14  4:26 AM  Result Value Ref Range   WBC 4.8 4.0 - 10.5 K/uL   RBC 3.05 (L) 3.87 - 5.11 MIL/uL   Hemoglobin 9.5 (L) 12.0 - 15.0 g/dL   HCT 28.3 (L) 36.0 - 46.0 %   MCV 92.8 78.0 - 100.0 fL   MCH 31.1 26.0 - 34.0 pg   MCHC 33.6 30.0 - 36.0 g/dL   RDW 13.6 11.5 - 15.5 %   Platelets 149 (L) 150 - 400 K/uL  Basic metabolic panel     Status: Abnormal   Collection Time: 08/10/14  4:26 AM  Result Value Ref Range   Sodium 141 135 - 145 mmol/L   Potassium 4.4 3.5 - 5.1 mmol/L   Chloride 114 (H) 96 - 112 mmol/L   CO2 23 19 - 32 mmol/L   Glucose, Bld 119 (H) 70 - 99 mg/dL   BUN 14 6 - 23 mg/dL   Creatinine, Ser 1.13 (H) 0.50 - 1.10 mg/dL   Calcium 8.5 8.4 - 10.5 mg/dL   GFR calc non Af Amer 44 (L) >90 mL/min   GFR calc Af Amer 51 (L) >90 mL/min   Anion gap 4 (L) 5 - 15     Services Ordered on  Discharge: Y = Yes; Blank = No PT:   OT:   RN:   Equipment:   Other:      Time Spent on Discharge: 35 min   Signed: Duwaine Maxin PGY 2, Internal Medicine Resident 08/10/2014, 1:14 PM

## 2014-08-10 NOTE — Progress Notes (Signed)
[facility]  Patient Status: Active (receiving services up to time of hospitalization)  [facility] is providing the following services: RN, PT, ST and MSW  If patient discharges after hours, please call [phone].   Jane [name] 08/10/2014, 11:19 AM

## 2014-08-10 NOTE — Clinical Social Work Note (Signed)
Met with patient and provided bed offers for SNF.  Patient is not sure whether or not she will want to go to SNF- she feels very uneasy leaving some personal business unattended to at home and does not feel as if she has anyone who can take care of it for her.  CSW will continue to follow.  Domenica Reamer, [place] Social Worker [phone]

## 2014-08-10 NOTE — Clinical Social Work Note (Signed)
Patient chooses bed at [facility] where her brother lives- CSW confirmed bed is available at [facility] for this afternoon.  CSW will continue to follow.  Domenica Reamer, [place] Social Worker [phone]

## 2014-08-10 NOTE — Progress Notes (Signed)
Patient Name: Jane [name] Date of Encounter: 08/10/2014     Active Problems:   Anemia   Essential hypertension   GERD   Chronic venous insufficiency b/l lower extremities   Ectropion of left lower eyelid   Heart failure with preserved ejection fraction   NSTEMI (non-ST elevated myocardial infarction)    SUBJECTIVE  Denies any CP or SOB.   CURRENT MEDS . aspirin  81 mg Oral Daily  . atorvastatin  20 mg Oral q1800  . bisoprolol  5 mg Oral Daily  . isosorbide mononitrate  30 mg Oral Daily  . losartan  50 mg Oral Daily  . pantoprazole  40 mg Oral Daily  . sodium chloride  3 mL Intravenous Q12H  . sodium chloride  3 mL Intravenous Q12H  . ticagrelor  90 mg Oral BID    OBJECTIVE  Filed Vitals:   08/09/14 2350 08/10/14 0014 08/10/14 0500 08/10/14 0544  BP: 147/53   119/63  Pulse: 70   62  Temp: 98 F (36.7 C)   97.8 F (36.6 C)  TempSrc: Oral   Oral  Resp: 18   20  Height:      Weight:  166 lb 0.1 oz (75.3 kg) 166 lb 0.1 oz (75.3 kg)   SpO2: 99%   98%    Intake/Output Summary (Last 24 hours) at 08/10/14 0722 Last data filed at 08/10/14 0532  Gross per 24 hour  Intake      0 ml  Output    900 ml  Net   -900 ml   Filed Weights   08/09/14 1947 08/10/14 0014 08/10/14 0500  Weight: 166 lb 0.1 oz (75.3 kg) 166 lb 0.1 oz (75.3 kg) 166 lb 0.1 oz (75.3 kg)    PHYSICAL EXAM  General: Pleasant, NAD. Neuro: Alert and oriented X 3. Moves all extremities spontaneously. Psych: Normal affect. HEENT:  Normal  Neck: Supple without bruits or JVD. Lungs:  Resp regular and unlabored, CTA.  Heart: RRR no s3, s4, or murmurs. R radial site stable, no bleeding. Distal pulse 2+ Abdomen: Soft, non-tender, non-distended, BS + x 4.  Extremities: No clubbing, cyanosis or edema. DP/PT/Radials 2+ and equal bilaterally.  Accessory Clinical Findings  CBC  Recent Labs  08/09/14 0548 08/10/14 0426  WBC 4.8 4.8  HGB 9.4* 9.5*  HCT 28.1* 28.3*  MCV 94.9 92.8  PLT 136*  962*   Basic Metabolic Panel  Recent Labs  08/09/14 0548 08/10/14 0426  NA 142 141  K 3.9 4.4  CL 113* 114*  CO2 25 23  GLUCOSE 87 119*  BUN 18 14  CREATININE 1.09 1.13*  CALCIUM 8.6 8.5   Cardiac Enzymes  Recent Labs  08/07/14 1347 08/07/14 1825 08/08/14 0045  TROPONINI 0.41* 0.37* 0.31*    TELE NSR with HR 50s    ECG  NSR with HR nonspecific T wave changes  Echocardiogram 06/01/2014  - Left ventricle: The cavity size was normal. Wall thickness was increased in a pattern of mild LVH. The estimated ejection fraction was 60%. Wall motion was normal; there were no regional wall motion abnormalities. - Aortic valve: There was mild regurgitation. - Right ventricle: The cavity size was normal. Systolic function was normal. - Pulmonary arteries: PA peak pressure: 29 mm Hg (S). - Pericardium, extracardiac: A trivial pericardial effusion was identified posterior to the heart.    Radiology/Studies  Dg Chest 2 View  08/06/2014   CLINICAL DATA:  Shortness of breath with chest and back  pain.  EXAM: CHEST  2 VIEW  COMPARISON:  07/29/2014  FINDINGS: Lungs are adequately inflated with minimal prominence of the perihilar markings compatible mild stable vascular congestion. No definite effusion. Flattening of the hemidiaphragms on the lateral film. Mild stable cardiomegaly. Calcified plaque is present over the thoracic aorta. There are degenerative changes of the spine. Evidence of a mild compression deformity of the upper thoracic spine unchanged.  IMPRESSION: Mild cardiomegaly and suggestion of minimal vascular congestion unchanged.  Stable upper thoracic spine compression fracture.   Electronically Signed   By: Marin Olp M.D.   On: 08/06/2014 08:28   Dg Chest 2 View  07/29/2014   CLINICAL DATA:  New onset shortness of breath this morning. Anxiety.  EXAM: CHEST  2 VIEW  COMPARISON:  07/17/2014  FINDINGS: Examination limited due to motion artifact. Mild cardiac  enlargement and pulmonary vascular congestion. No edema or consolidation. No blunting of costophrenic angles. No pneumothorax. Calcification of the aorta. Degenerative changes in the spine and shoulders.  IMPRESSION: Mild cardiac enlargement and pulmonary vascular congestion without edema or consolidation.   Electronically Signed   By: Lucienne Capers M.D.   On: 07/29/2014 05:59   Dg Chest 2 View  07/17/2014   CLINICAL DATA:  Shortness of breath.  Ex-smoker.  EXAM: CHEST  2 VIEW  COMPARISON:  06/25/2014.  Chest CT dated 06/12/2014.  FINDINGS: Stable mildly enlarged cardiac silhouette. Mild linear scarring at the right lung base is unchanged. The pulmonary vasculature and interstitial markings remain mildly prominent. No pleural fluid. Diffuse osteopenia. 40% T4 vertebral compression deformity without significant change since 06/12/2014.  IMPRESSION: Stable mild cardiomegaly, mild pulmonary vascular congestion and changes of COPD.   Electronically Signed   By: Enrique Sack M.D.   On: 07/17/2014 10:34   Ct Angio Chest Pe W/cm &/or Wo Cm  08/06/2014   CLINICAL DATA:  81 year old female with shortness of breath, nonproductive cough, tachycardia and pain between the shoulders and chest.  EXAM: CT ANGIOGRAPHY CHEST WITH CONTRAST  TECHNIQUE: Multidetector CT imaging of the chest was performed using the standard protocol during bolus administration of intravenous contrast. Multiplanar CT image reconstructions and MIPs were obtained to evaluate the vascular anatomy.  CONTRAST:  90mL OMNIPAQUE IOHEXOL 350 MG/ML SOLN  COMPARISON:  Chest x-ray obtained earlier today ; prior chest CT 06/12/2014  FINDINGS: Mediastinum: Unremarkable CT appearance of the thyroid gland. No suspicious mediastinal or hilar adenopathy. Small calcified right hilar nodes. No soft tissue mediastinal mass. Small hiatal hernia.  Heart/Vascular: Adequate opacification of the pulmonary arteries to the proximal subsegmental level. No central filling  defect to suggest acute pulmonary embolus. Main pulmonary artery within normal limits for size. Heart is at the upper limits of normal for size. Atherosclerotic calcifications noted in the left main, left anterior descending, circumflex and right coronary arteries. Normal caliber thoracic aorta with conventional 3 vessel arch anatomy.  Lungs/Pleura: No pleural effusion. Respiratory motion in the lower lobes limits evaluation for small pulmonary nodules. No evidence of pneumothorax or pulmonary edema. No focal airspace consolidation. Dependent atelectasis present in the bilateral lower lobes.  Bones/Soft Tissues: No acute fracture or aggressive appearing lytic or blastic osseous lesion.  Upper Abdomen: Visualized upper abdominal organs are unremarkable.  Review of the MIP images confirms the above findings.  IMPRESSION: 1. Negative for acute pulmonary embolus, pneumonia or other acute cardiopulmonary process. 2. 4 vessel coronary artery calcification. 3. Borderline cardiomegaly. 4. Small hiatal hernia. 5. Bilateral dependent lower lobe atelectasis.   Electronically  Signed   By: Jacqulynn Cadet M.D.   On: 08/06/2014 11:47   Nm Myocar Multi W/spect W/wall Motion / Ef  08/08/2014   CLINICAL DATA:  Chest pain, hypertension, hyperlipidemia, shortness of breath and elevated troponin levels  EXAM: MYOCARDIAL IMAGING WITH SPECT (REST AND PHARMACOLOGIC-STRESS)  GATED LEFT VENTRICULAR WALL MOTION STUDY  LEFT VENTRICULAR EJECTION FRACTION  TECHNIQUE: Standard myocardial SPECT imaging was performed after resting intravenous injection of 10 mCi Tc-58m sestamibi. Subsequently, intravenous infusion of Lexiscan was performed under the supervision of the Cardiology staff. At peak effect of the drug, 30 mCi Tc-60m sestamibi was injected intravenously and standard myocardial SPECT imaging was performed. Quantitative gated imaging was also performed to evaluate left ventricular wall motion, and estimate left ventricular ejection  fraction.  COMPARISON:  None.  FINDINGS: Perfusion: There is evidence of moderate sized perfusion defect of the lateral and inferolateral walls extending roughly from the base of the heart to the mid ventricular level on the stress study with relatively normal perfusion of this region on the rest study. Findings are consistent with ischemia. No fixed perfusion defects are identified.  Wall Motion: Normal left ventricular wall motion. No left ventricular dilation.  Left Ventricular Ejection Fraction: 70 %  End diastolic volume 55 ml  End systolic volume 16 ml  IMPRESSION: 1. Evidence of inducible myocardial ischemia involving the lateral and inferolateral walls of the left ventricle. The perfusion defect is moderate in size.  2. Normal left ventricular wall motion.  3. Left ventricular ejection fraction 70%  4. Intermediate-risk stress test findings*.  *2012 Appropriate Use Criteria for Coronary Revascularization Focused Update: J Am Coll Cardiol. 8453;64(6):803-212. http://content.airportbarriers.com.aspx?articleid=1201161   Electronically Signed   By: Aletta Edouard M.D.   On: 08/08/2014 14:44   Portable Chest 1 View  08/07/2014   CLINICAL DATA:  Initial evaluation for chest pain with burning sensation in throat  EXAM: PORTABLE CHEST - 1 VIEW  COMPARISON:  08/06/2014  FINDINGS: Mild cardiac enlargement. Stable aortic calcification. Vascular pattern normal. No consolidation or effusion.  IMPRESSION: No active disease.   Electronically Signed   By: Skipper Cliche M.D.   On: 08/07/2014 17:30    ASSESSMENT AND PLAN  1. NSTEMI  - abnormal myoview with large area of inferior and lateral ischemia  - cath 08/09/2014 95% stenosis in prox to mid RCA s/p DES, 80-90% prox OM2, 50% distal LAD  - continue ASA, lipitor, bisoprolol, imdur, losartan and Brilinta. Stable for discharge (?SNF per IM service). Outpatient followup scheduled.  2. HTN 3. HLD 4. Anemia 5. Dementia   Signed, Almyra Deforest PA-C Pager:  [phone]  Patient seen and examined. Agree with assessment and plan. No recurrent chest pain s/p PCI to RCA. Initial medical therapy for LCX OM disease.   Troy Sine, MD, [facility] 08/10/2014 9:49 AM

## 2014-08-10 NOTE — Care Management Utilization Note (Signed)
UR complete.  Lionel December, RN, BSN Phone [phone]

## 2014-08-10 NOTE — Clinical Documentation Improvement (Signed)
MD's, PA's, and NP's  Documentation of "heart failure" please specify type and acuity .  Thank you   Possible Clinical Conditions?  Chronic Systolic Congestive Heart Failure  Chronic Diastolic Congestive Heart Failure  Chronic Systolic & Diastolic Congestive Heart Failure  Other Condition  Cannot Clinically Determine  Treatment: continue home meds   Thank You, Ree Kida ,RN Clinical Documentation Specialist:  [place] Information Management

## 2014-08-10 NOTE — Discharge Instructions (Addendum)
1. You have hospital follow up appointments as follows: With Dr. Luan Moore on Aug 18, 2014. With Dr. Mamie Laurel on Aug 24, 2014.   2. Please take all medications as prescribed.    3. If you have worsening of your symptoms or new symptoms arise, please call the clinic ([phone]), or go to the ER immediately if symptoms are severe.

## 2014-08-10 NOTE — Progress Notes (Signed)
PT Cancellation Note  Patient Details Name: Jane [name] MRN: [id number] DOB: March 24, 1929   Cancelled Treatment:    Reason Eval/Treat Not Completed: Per chart review, pt is to d/c to SNF this afternoon. If d/c is delayed, will see tomorrow as schedule allows.    Rolinda Roan 08/10/2014, 1:12 PM   Rolinda Roan, PT, DPT Acute Rehabilitation Services Pager: [phone]

## 2014-08-10 NOTE — Progress Notes (Signed)
CARE MANAGEMENT NOTE 08/10/2014  Patient:  TAYLORE, HINDE   Account Number:  [id number]  Date Initiated:  08/10/2014  Documentation initiated by:  [facility]  Subjective/Objective Assessment:   NSTEMI     Action/Plan:   lives alone in her own apartment   Anticipated DC Date:     Anticipated DC Plan:  [place] referral  Clinical Social Worker      DC Planning Services  CM consult      Choice offered to / List presented to:             Status of service:  In process, will continue to follow Medicare Important Message given?  YES (If response is "NO", the following Medicare IM given date fields will be blank) Date Medicare IM given:  08/10/2014 Medicare IM given by:  [facility] Date Additional Medicare IM given:   Additional Medicare IM given by:    Discharge Disposition:    Per UR Regulation:    If discussed at Long Length of Stay Meetings, dates discussed:    Comments:  08/10/2014 1055 NCM spoke to pt and states she prefers to go home but she lives alone. She is considering SNF-rehab. She will need to discuss son, Manuela Neptune # [phone]. CSW referral for SNF placement. Jonnie Finner RN CCM [phone]

## 2014-08-10 NOTE — Progress Notes (Signed)
CARDIAC REHAB PHASE I   PRE:  Rate/Rhythm: 62 SR  BP:  Supine: 159/65  Sitting:   Standing:    SaO2: 98 RA  MODE:  Ambulation: 200 ft   POST:  Rate/Rhythm: 102  BP:  Supine:   Sitting: 160/70  Standing:    SaO2: 97 RA 1040-1125 Assisted X 1 and used walker to ambulate. Gait steady with walker. Pt able to walk 200 feet without c/o of cp. She c/o of slight SOB by end of walk, RA sat 97%. Pt to recliner after walk with call light in reach. Discussed stent and importance of taking Brilinta everyday. She states that she can not use a pill box, that it mess her up. She likes to take them from the bottles. When I questioned her how she would know if she had forgotten it she states she wouldn't.  Rodney Langton RN 08/10/2014 11:25 AM

## 2014-08-10 NOTE — Progress Notes (Signed)
CARE MANAGEMENT NOTE 08/10/2014  Patient:  Jane [name], Jane [name]   Account Number:  [id number]  Date Initiated:  08/10/2014  Documentation initiated by:  [facility]  Subjective/Objective Assessment:   NSTEMI     Action/Plan:   lives alone in her own apartment   Anticipated DC Date:  08/11/2014   Anticipated DC Plan:  [place]  In-house referral  Clinical Social Worker      DC Planning Services  CM consult      Choice offered to / List presented to:             Status of service:  Completed, signed off Medicare Important Message given?  YES (If response is "NO", the following Medicare IM given date fields will be blank) Date Medicare IM given:  08/10/2014 Medicare IM given by:  [facility] Date Additional Medicare IM given:   Additional Medicare IM given by:    Discharge Disposition:  [place]  Per UR Regulation:    If discussed at Long Length of Stay Meetings, dates discussed:    Comments:  08/10/2014 1055 NCM spoke to pt and states she prefers to go home but she lives alone. She is considering SNF-rehab. She will need to discuss son, Jane [name] # [phone]. CSW referral for SNF placement. Jonnie Finner RN CCM [phone]

## 2014-08-11 ENCOUNTER — Non-Acute Institutional Stay (SKILLED_NURSING_FACILITY): Payer: Medicare Other | Admitting: Internal Medicine

## 2014-08-11 DIAGNOSIS — I214 Non-ST elevation (NSTEMI) myocardial infarction: Secondary | ICD-10-CM | POA: Diagnosis not present

## 2014-08-11 DIAGNOSIS — I5030 Unspecified diastolic (congestive) heart failure: Secondary | ICD-10-CM

## 2014-08-11 DIAGNOSIS — H02105 Unspecified ectropion of left lower eyelid: Secondary | ICD-10-CM

## 2014-08-11 DIAGNOSIS — D509 Iron deficiency anemia, unspecified: Secondary | ICD-10-CM

## 2014-08-11 DIAGNOSIS — J439 Emphysema, unspecified: Secondary | ICD-10-CM

## 2014-08-11 DIAGNOSIS — I509 Heart failure, unspecified: Secondary | ICD-10-CM | POA: Diagnosis not present

## 2014-08-11 DIAGNOSIS — I10 Essential (primary) hypertension: Secondary | ICD-10-CM

## 2014-08-11 DIAGNOSIS — K219 Gastro-esophageal reflux disease without esophagitis: Secondary | ICD-10-CM | POA: Diagnosis not present

## 2014-08-11 LAB — POCT ACTIVATED CLOTTING TIME: ACTIVATED CLOTTING TIME: 638 s

## 2014-08-11 NOTE — Progress Notes (Signed)
MRN: [id number] Name: Jane [name]  Sex: female Age: 81 y.o. DOB: September 09, 1928  [place] #: [facility] Facility/Room:112 Level Of Care: SNF Provider: Inocencio Homes D Emergency Contacts: Extended Emergency Contact Information Primary Emergency Contact: Macadoo,Michael Address: [address] [address] [place] Phone: [phone] Relation: Son Secondary Emergency Contact: Tatum,Cleatus  [place] Phone: [phone] Relation: Sister   Allergies: Review of patient's allergies indicates no known allergies.  Chief Complaint  Patient presents with  . New Admit To SNF    HPI: Patient is 81 y.o. female who is admitted to SNF for OT/PT after an  NSTEMI and PTCA.  Past Medical History  Diagnosis Date  . Anemia   . Anxiety   . Arthritis   . GERD (gastroesophageal reflux disease) 2009    EGD with benign gastric polyp too  . Hyperlipidemia   . Hypertension   . Vitamin D deficiency   . Colon polyp     2009 colonoscopy, not retrieved for pathology  . Hiatal hernia   . Diverticulosis 2009  . Aortic insufficiency     mild  . Stroke 1975  . CKD (chronic kidney disease) stage 3, GFR 30-59 ml/min   . CHF (congestive heart failure)   . CAD (coronary artery disease)     cath 08/09/2014 95% stenosis in prox to mid RCA s/p DES, 80-90% prox OM2, 50% distal LAD    Past Surgical History  Procedure Laterality Date  . Appendectomy    . Abdominal hysterectomy    . Artery biopsy Left 12/16/2012    Procedure: BIOPSY TEMPORAL ARTERY;  Surgeon: Mal Misty, MD;  Location: [place];  Service: Vascular;  Laterality: Left;  . Left heart catheterization with coronary angiogram N/A 08/09/2014    Procedure: LEFT HEART CATHETERIZATION WITH CORONARY ANGIOGRAM;  Surgeon: Peter M Martinique, MD;  Location: [facility] CATH LAB;  Service: Cardiovascular;  Laterality: N/A;  . Percutaneous coronary stent intervention (pci-s)  08/09/2014    Procedure: PERCUTANEOUS  CORONARY STENT INTERVENTION (PCI-S);  Surgeon: Peter M Martinique, MD;  Location[facility] C[facility] LAB;  Service: Cardiovascular;;      Medication List       This list is accurate as of: 08/11/14 11:59 PM.  Always use your most recent med list.               acetaminophen 325 MG tablet  Commonly known as:  TYLENOL  Take 650 mg by mouth every 6 (six) hours as needed for mild pain.     albuterol 108 (90 BASE) MCG/ACT inhaler  Commonly known as:  PROVENTIL HFA;VENTOLIN HFA  Inhale 2 puffs into the lungs every 4 (four) hours as needed for wheezing or shortness of breath.     aspirin EC 81 MG tablet  Take 81 mg by mouth daily.     atorvastatin 20 MG tablet  Commonly known as:  LIPITOR  Take 1 tablet (20 mg total) by mouth daily at 6 PM.     bisoprolol 5 MG tablet  Commonly known as:  ZEBETA  Take 1 tablet (5 mg total) by mouth daily.     ferrous sulfate 325 (65 FE) MG tablet  take 1 tablet by mouth twice a day     hydroxypropyl methylcellulose / hypromellose 2.5 % ophthalmic solution  Commonly known as:  ISOPTO TEARS / GONIOVISC  Place 1 drop into the left eye 4 (four) times daily as needed  for dry eyes.     isosorbide mononitrate 30 MG 24 hr tablet  Commonly known as:  IMDUR  Take 1 tablet (30 mg total) by mouth daily.     omeprazole 20 MG capsule  Commonly known as:  PRILOSEC  Take 1 capsule (20 mg total) by mouth 2 (two) times daily before a meal.     potassium chloride SA 20 MEQ tablet  Commonly known as:  K-DUR,KLOR-CON  take 1 tablet by mouth once daily     ticagrelor 90 MG Tabs tablet  Commonly known as:  BRILINTA  Take 1 tablet (90 mg total) by mouth 2 (two) times daily.        No orders of the defined types were placed in this encounter.    Immunization History  Administered Date(s) Administered  . Influenza Whole 03/28/2008  . Influenza,inj,Quad PF,36+ Mos 08/09/2013  . Pneumococcal Polysaccharide-23 09/18/2012    History  Substance Use Topics  . Smoking  status: Former Smoker    Types: Cigarettes    Quit date: 07/02/1979  . Smokeless tobacco: Former Systems developer  . Alcohol Use: No    Family history is noncontributory    Review of Systems  DATA OBTAINED: from patient GENERAL:  no fevers, fatigue, appetite changes SKIN: No itching, rash or wounds EYES: No eye pain, redness, discharge EARS: No earache, tinnitus, change in hearing NOSE: No congestion, drainage or bleeding  MOUTH/THROAT: No mouth or tooth pain, No sore throat RESPIRATORY: No cough, wheezing, SOB CARDIAC: No chest pain, palpitations, lower extremity edema  GI: No abdominal pain, No N/V/D or constipation, No heartburn or reflux  GU: No dysuria, frequency or urgency, or incontinence  MUSCULOSKELETAL: No unrelieved bone/joint pain NEUROLOGIC: No headache, dizziness or focal weakness PSYCHIATRIC: No overt anxiety or sadness, No behavior issue.   Filed Vitals:   08/11/14 2020  BP: 156/82  Pulse: 85  Temp: 96.7 F (35.9 C)  Resp: 20    Physical Exam  GENERAL APPEARANCE: Alert, conversant,  No acute distress.  SKIN: No diaphoresis rash HEAD: Normocephalic, atraumatic  EYES: Conjunctiva/lids clear. Pupils round, reactive. EOMs intact.  EARS: External exam WNL, canals clear. Hearing grossly normal.  NOSE: No deformity or discharge.  MOUTH/THROAT: Lips w/o lesions  RESPIRATORY: Breathing is even, unlabored. Lung sounds are clear   CARDIOVASCULAR: Heart RRR no murmurs, rubs or gallops. No peripheral edema.   GASTROINTESTINAL: Abdomen is soft, non-tender, not distended w/ normal bowel sounds. GENITOURINARY: Bladder non tender, not distended  MUSCULOSKELETAL: No abnormal joints or musculature NEUROLOGIC:  Cranial nerves 2-12 grossly intact. Moves all extremities  PSYCHIATRIC:  no behavioral issues  Patient Active Problem List   Diagnosis Date Noted  . COPD (chronic obstructive pulmonary disease) 08/13/2014  . NSTEMI (non-ST elevated myocardial infarction) 08/07/2014  .  Seborrheic keratoses 08/05/2014  . Dyspnea 05/31/2014  . Tachycardia 05/31/2014  . Dyspnea on exertion 05/31/2014  . Dementia 04/28/2014  . Rectal bleeding 09/09/2013  . Hemorrhoids 09/09/2013  . Rash and nonspecific skin eruption 08/09/2013  . Lid retraction of left eye 08/09/2013  . Pain of left calf 03/03/2013  . Left upper arm pain 01/16/2013  . Decreased dorsalis pedis pulse 01/11/2013  . Heart failure with preserved ejection fraction 12/27/2012  . Unstable balance 12/20/2012  . Ectropion of left lower eyelid 12/17/2012  . Onychomycosis of toenail 09/18/2012  . Chronic venous insufficiency b/l lower extremities 09/18/2012  . Preventative health care 09/18/2012  . AORTIC REGURGITATION, MODERATE 04/29/2008  . VITAMIN D DEFICIENCY 04/25/2008  .  C V A / STROKE 10/29/2007  . GERD 10/29/2007  . HYPERLIPIDEMIA 09/24/2006  . Anemia 09/24/2006  . ANXIETY 09/24/2006  . Essential hypertension 09/24/2006  . DIVERTICULOSIS, COLON 09/24/2006  . OSTEOARTHRITIS 09/24/2006  . OSTEOPENIA 09/24/2006  . CONSTIPATION, CHRONIC, HX OF 09/24/2006  . VITAMIN B12 DEFICIENCY 08/29/2006    CBC    Component Value Date/Time   WBC 4.8 08/10/2014 0426   RBC 3.05* 08/10/2014 0426   HGB 9.5* 08/10/2014 0426   HCT 28.3* 08/10/2014 0426   PLT 149* 08/10/2014 0426   MCV 92.8 08/10/2014 0426   LYMPHSABS 0.8 08/07/2014 0501   MONOABS 0.5 08/07/2014 0501   EOSABS 0.0 08/07/2014 0501   BASOSABS 0.0 08/07/2014 0501    CMP     Component Value Date/Time   NA 141 08/10/2014 0426   K 4.4 08/10/2014 0426   CL 114* 08/10/2014 0426   CO2 23 08/10/2014 0426   GLUCOSE 119* 08/10/2014 0426   BUN 14 08/10/2014 0426   CREATININE 1.13* 08/10/2014 0426   CREATININE 0.90 03/11/2013 1522   CALCIUM 8.5 08/10/2014 0426   PROT 7.2 08/06/2014 0709   ALBUMIN 3.8 08/06/2014 0709   AST 21 08/06/2014 0709   ALT 11 08/06/2014 0709   ALKPHOS 77 08/06/2014 0709   BILITOT 0.8 08/06/2014 0709   GFRNONAA 44*  08/10/2014 0426   GFRNONAA 61 03/03/2013 1634   GFRAA 51* 08/10/2014 0426   GFRAA 71 03/03/2013 1634    Assessment and Plan  NSTEMI (non-ST elevated myocardial infarction) . NST showed moderate perfusion defect in the lateral and inferolateral walls with no wall motion abnormalities along with EF of 70%. The following day, pt underwent PCI with the following diagnostic findings: Severe 2 vessel obstructive CAD; LAD with 50% stenosis; OM 1 occluded proximally; OM 2 with 80-90% stenosis; RCA with 95% stenosis. Additionally, she received intervention with successful stenting of RCA with Synergy DES. The residual disease in OM 1 and 2 will be managed medically. Pt was kept overnight for observation and then discharged the following afternoon to [facility] in stable condition. She was placed on DAPT therapy (ASA and Brilinta) for one year. She was also started on atorvastatin 20mg .   Essential hypertension Pt's baseline BP during this admission was 140-150s/50s. Her Lasix was held during this admission due to her receiving contrast during cardiac procedures. Her SCr on the day of discharge was 1.13. During this admission, she was switched from her home metoprolol to bisoprolol and started on Imdur. Upon discharge to SNF she is to resume Losartan 50mg .   Heart failure with preserved ejection fraction Stable; continue home meds   Anemia Stable; continue home ferrous sulfate 325mg     GERD Stable; continue home omeprazole 20mg     COPD (chronic obstructive pulmonary disease) Stable;home nebs   Ectropion of left lower eyelid Continue home Isopto tears     Hennie Duos, MD

## 2014-08-13 ENCOUNTER — Encounter: Payer: Self-pay | Admitting: Internal Medicine

## 2014-08-13 DIAGNOSIS — J449 Chronic obstructive pulmonary disease, unspecified: Secondary | ICD-10-CM | POA: Insufficient documentation

## 2014-08-13 NOTE — Assessment & Plan Note (Signed)
Pt's baseline BP during this admission was 140-150s/50s. Her Lasix was held during this admission due to her receiving contrast during cardiac procedures. Her SCr on the day of discharge was 1.13. During this admission, she was switched from her home metoprolol to bisoprolol and started on Imdur. Upon discharge to SNF she is to resume Losartan 50mg .

## 2014-08-13 NOTE — Assessment & Plan Note (Signed)
Stable; continue home omeprazole 20mg 

## 2014-08-13 NOTE — Assessment & Plan Note (Signed)
Stable - continue home meds 

## 2014-08-13 NOTE — Assessment & Plan Note (Signed)
Stable;home nebs

## 2014-08-13 NOTE — Assessment & Plan Note (Signed)
.   NST showed moderate perfusion defect in the lateral and inferolateral walls with no wall motion abnormalities along with EF of 70%. The following day, pt underwent PCI with the following diagnostic findings: Severe 2 vessel obstructive CAD; LAD with 50% stenosis; OM 1 occluded proximally; OM 2 with 80-90% stenosis; RCA with 95% stenosis. Additionally, she received intervention with successful stenting of RCA with Synergy DES. The residual disease in OM 1 and 2 will be managed medically. Pt was kept overnight for observation and then discharged the following afternoon to [facility] in stable condition. She was placed on DAPT therapy (ASA and Brilinta) for one year. She was also started on atorvastatin 20mg .

## 2014-08-13 NOTE — Assessment & Plan Note (Signed)
Stable; continue home ferrous sulfate 325mg 

## 2014-08-13 NOTE — Assessment & Plan Note (Signed)
Continue home Isopto tears

## 2014-08-17 ENCOUNTER — Telehealth: Payer: Self-pay | Admitting: Internal Medicine

## 2014-08-17 NOTE — Telephone Encounter (Signed)
Call to patient to confirm appointment for 08/18/14 at 1:45 no answer

## 2014-08-18 ENCOUNTER — Encounter: Payer: Self-pay | Admitting: Internal Medicine

## 2014-08-18 ENCOUNTER — Ambulatory Visit: Payer: Medicare Other | Admitting: Internal Medicine

## 2014-08-24 ENCOUNTER — Ambulatory Visit: Payer: Medicare Other | Admitting: Physician Assistant

## 2014-08-30 ENCOUNTER — Non-Acute Institutional Stay (SKILLED_NURSING_FACILITY): Payer: Medicare Other | Admitting: Nurse Practitioner

## 2014-08-30 DIAGNOSIS — I509 Heart failure, unspecified: Secondary | ICD-10-CM | POA: Diagnosis not present

## 2014-08-30 DIAGNOSIS — J439 Emphysema, unspecified: Secondary | ICD-10-CM

## 2014-08-30 DIAGNOSIS — D649 Anemia, unspecified: Secondary | ICD-10-CM | POA: Diagnosis not present

## 2014-08-30 DIAGNOSIS — K219 Gastro-esophageal reflux disease without esophagitis: Secondary | ICD-10-CM

## 2014-08-30 DIAGNOSIS — I214 Non-ST elevation (NSTEMI) myocardial infarction: Secondary | ICD-10-CM | POA: Diagnosis not present

## 2014-08-30 DIAGNOSIS — H02105 Unspecified ectropion of left lower eyelid: Secondary | ICD-10-CM

## 2014-08-30 DIAGNOSIS — I10 Essential (primary) hypertension: Secondary | ICD-10-CM | POA: Diagnosis not present

## 2014-08-30 DIAGNOSIS — I5030 Unspecified diastolic (congestive) heart failure: Secondary | ICD-10-CM

## 2014-08-30 NOTE — Progress Notes (Signed)
Patient ID: Jane [name], female   DOB: [birth year], 81 y.o.   MRN: [id number]    Nursing Home Location:  [place] of Service: SNF (31)  PCP: Luan Moore, MD  No Known Allergies  Chief Complaint  Patient presents with  . Medical Management of Chronic Issues    HPI:  Patient is a 81 y.o. female seen today at [facility] for routine follow up after hospitalization for NSTEMI. Pt with pmh of CAD,COPD, CHF, hyperlipidemia, htn, GERD, CKD. Pt is doing well while at [facility] doing PT/OT. No worsening shortness of breath or chest pains. Pt without complaints at this time. Staff has no concerns.   Review of Systems:  Review of Systems  Constitutional: Negative for activity change, appetite change, fatigue and unexpected weight change.  HENT: Negative for congestion and hearing loss.   Eyes: Negative.   Respiratory: Negative for cough and shortness of breath.   Cardiovascular: Negative for chest pain, palpitations and leg swelling.  Gastrointestinal: Negative for abdominal pain, diarrhea and constipation.       Occasional acid reflux  Genitourinary: Negative for dysuria and difficulty urinating.  Musculoskeletal: Negative for myalgias and arthralgias.  Skin: Negative for color change and wound.  Neurological: Negative for dizziness and weakness.  Psychiatric/Behavioral: Negative for behavioral problems, confusion and agitation.    Past Medical History  Diagnosis Date  . Anemia   . Anxiety   . Arthritis   . GERD (gastroesophageal reflux disease) 2009    EGD with benign gastric polyp too  . Hyperlipidemia   . Hypertension   . Vitamin D deficiency   . Colon polyp     2009 colonoscopy, not retrieved for pathology  . Hiatal hernia   . Diverticulosis 2009  . Aortic insufficiency     mild  . Stroke 1975  . CKD (chronic kidney disease) stage 3, GFR 30-59 ml/min   . CHF (congestive heart failure)   . CAD (coronary artery disease)    cath 08/09/2014 95% stenosis in prox to mid RCA s/p DES, 80-90% prox OM2, 50% distal LAD   Past Surgical History  Procedure Laterality Date  . Appendectomy    . Abdominal hysterectomy    . Artery biopsy Left 12/16/2012    Procedure: BIOPSY TEMPORAL ARTERY;  Surgeon: Mal Misty, MD;  Location: [place];  Service: Vascular;  Laterality: Left;  . Left heart catheterization with coronary angiogram N/A 08/09/2014    Procedure: LEFT HEART CATHETERIZATION WITH CORONARY ANGIOGRAM;  Surgeon: Peter M Martinique, MD;  Location: [facility] CATH LAB;  Service: Cardiovascular;  Laterality: N/A;  . Percutaneous coronary stent intervention (pci-s)  08/09/2014    Procedure: PERCUTANEOUS CORONARY STENT INTERVENTION (PCI-S);  Surgeon: Peter M Martinique, MD;  Location: [facility] CATH LAB;  Service: Cardiovascular;;   Social History:   reports that she quit smoking about 35 years ago. Her smoking use included Cigarettes. She has quit using smokeless tobacco. She reports that she does not drink alcohol or use illicit drugs.  Family History  Problem Relation Age of Onset  . Stroke Mother   . Hypertension Mother   . Stroke Father   . Hypertension Father   . Diabetes Sister     Medications: Patient's Medications  New Prescriptions   No medications on file  Previous Medications   ACETAMINOPHEN (TYLENOL) 325 MG TABLET    Take 650 mg by mouth every 6 (six) hours as needed for mild pain.   ALBUTEROL (PROVENTIL  HFA;VENTOLIN HFA) 108 (90 BASE) MCG/ACT INHALER    Inhale 2 puffs into the lungs every 4 (four) hours as needed for wheezing or shortness of breath.   ASPIRIN EC 81 MG TABLET    Take 81 mg by mouth daily.   ATORVASTATIN (LIPITOR) 20 MG TABLET    Take 1 tablet (20 mg total) by mouth daily at 6 PM.   BISOPROLOL (ZEBETA) 5 MG TABLET    Take 1 tablet (5 mg total) by mouth daily.   FERROUS SULFATE 325 (65 FE) MG TABLET    take 1 tablet by mouth twice a day   HYDROXYPROPYL METHYLCELLULOSE / HYPROMELLOSE (ISOPTO TEARS / GONIOVISC) 2.5 %  OPHTHALMIC SOLUTION    Place 1 drop into the left eye 4 (four) times daily as needed for dry eyes.   ISOSORBIDE MONONITRATE (IMDUR) 30 MG 24 HR TABLET    Take 1 tablet (30 mg total) by mouth daily.   OMEPRAZOLE (PRILOSEC) 20 MG CAPSULE    Take 1 capsule (20 mg total) by mouth 2 (two) times daily before a meal.   POTASSIUM CHLORIDE SA (K-DUR,KLOR-CON) 20 MEQ TABLET    take 1 tablet by mouth once daily   TICAGRELOR (BRILINTA) 90 MG TABS TABLET    Take 1 tablet (90 mg total) by mouth 2 (two) times daily.  Modified Medications   No medications on file  Discontinued Medications   No medications on file     Physical Exam: Filed Vitals:   08/30/14 1152  BP: 110/52  Pulse: 80  Temp: 97.2 F (36.2 C)  Resp: 20  Weight: 165 lb (74.844 kg)    Physical Exam  Constitutional: She is oriented to person, place, and time. She appears well-developed and well-nourished. No distress.  HENT:  Head: Normocephalic and atraumatic.  Eyes:   Ectropion of left lower eyelid  Neck: Normal range of motion. Neck supple.  Cardiovascular: Normal rate, regular rhythm and normal heart sounds.   Pulmonary/Chest: Effort normal and breath sounds normal.  Abdominal: Soft. Bowel sounds are normal.  Musculoskeletal: She exhibits no edema or tenderness.  Neurological: She is alert and oriented to person, place, and time.  Skin: Skin is warm and dry. She is not diaphoretic.  Psychiatric: She has a normal mood and affect.    Labs reviewed: Basic Metabolic Panel:  Recent Labs  08/08/14 0045 08/09/14 0548 08/10/14 0426  NA 139 142 141  K 3.6 3.9 4.4  CL 111 113* 114*  CO2 20 25 23   GLUCOSE 111* 87 119*  BUN 19 18 14   CREATININE 1.03 1.09 1.13*  CALCIUM 8.6 8.6 8.5   Liver Function Tests:  Recent Labs  04/12/14 0825 06/01/14 0540 08/06/14 0709  AST 18 13 21   ALT 8 6 11   ALKPHOS 86 68 77  BILITOT 0.4 0.5 0.8  PROT 7.5 6.4 7.2  ALBUMIN 3.7 3.1* 3.8    Recent Labs  09/09/13 1138  LIPASE 15    No results for input(s): AMMONIA in the last 8760 hours. CBC:  Recent Labs  07/29/14 0554 08/06/14 0709 08/07/14 0501 08/08/14 0045 08/09/14 0548 08/10/14 0426  WBC 4.0 3.9* 7.5 7.1 4.8 4.8  NEUTROABS 2.0 1.7 6.2  --   --   --   HGB 10.3* 10.5* 10.1* 9.5* 9.4* 9.5*  HCT 31.3* 31.1* 30.5* 28.7* 28.1* 28.3*  MCV 95.1 92.0 94.7 93.5 94.9 92.8  PLT 155 144* 158 131* 136* 149*   TSH:  Recent Labs  05/31/14 1545  TSH  0.923   A1C: Lab Results  Component Value Date   HGBA1C 5.7* 06/01/2014   Lipid Panel:  Recent Labs  06/01/14 0633  CHOL 193  HDL 45  LDLCALC 131*  TRIG 84  CHOLHDL 4.3     Assessment/Plan 1. Essential hypertension Controlled on current regimen  2. Heart failure with preserved ejection fraction Stable at this time, conts on on lasix with potassium supplement, bisoprolol, and lipitor -will follow up bmp  3. NSTEMI (non-ST elevated myocardial infarction) Pt recently hospitalized due to NSTEMI with shortness of breath. No current symptoms of shortness of breath or chest pains. conts on ticagrelor, imdur and ASA  4. Pulmonary emphysema, unspecified emphysema type COPD stable  5. Ectropion of left lower eyelid Unchanged, conts drops  6. Anemia, unspecified anemia type conts on ferrous sulfate, will follow up cbc  7. Gastroesophageal reflux disease without esophagitis Occasional symptoms, remains on Prilosec 20 mg BID   Labs/tests ordered

## 2014-08-31 NOTE — Addendum Note (Signed)
Addended by: Hulan Fray on: 08/31/2014 07:57 PM   Modules accepted: Orders

## 2014-09-08 ENCOUNTER — Non-Acute Institutional Stay (SKILLED_NURSING_FACILITY): Payer: Medicare Other | Admitting: Internal Medicine

## 2014-09-08 DIAGNOSIS — L989 Disorder of the skin and subcutaneous tissue, unspecified: Secondary | ICD-10-CM | POA: Diagnosis not present

## 2014-09-08 NOTE — Progress Notes (Signed)
MRN: [id number] Name: Jane [name]  Sex: female Age: 81 y.o. DOB: 18-Oct-1928  [facility] Facility/Room:112 Level Of Care: SNF Provider: Inocencio Homes D Emergency Contacts: Extended Emergency Contact Information Primary Emergency Contact: Macadoo,Michael Address: [address] [address] [place] Phone: [phone] Relation: Son Secondary Emergency Contact: Tatum,Cleatus  [place] Phone: [phone] Relation: Sister    Allergies: Review of patient's allergies indicates no known allergies.  Chief Complaint  Patient presents with  . Acute Visit    HPI: Patient is 81 y.o. female who nursing asked me to see for a skin lesion on her scalp.  Past Medical History  Diagnosis Date  . Anemia   . Anxiety   . Arthritis   . GERD (gastroesophageal reflux disease) 2009    EGD with benign gastric polyp too  . Hyperlipidemia   . Hypertension   . Vitamin D deficiency   . Colon polyp     2009 colonoscopy, not retrieved for pathology  . Hiatal hernia   . Diverticulosis 2009  . Aortic insufficiency     mild  . Stroke 1975  . CKD (chronic kidney disease) stage 3, GFR 30-59 ml/min   . CHF (congestive heart failure)   . CAD (coronary artery disease)     cath 08/09/2014 95% stenosis in prox to mid RCA s/p DES, 80-90% prox OM2, 50% distal LAD    Past Surgical History  Procedure Laterality Date  . Appendectomy    . Abdominal hysterectomy    . Artery biopsy Left 12/16/2012    Procedure: BIOPSY TEMPORAL ARTERY;  Surgeon: Mal Misty, MD;  Location: [facility];  Service: Vascular;  Laterality: Left;  . Left heart catheterization with coronary angiogram N/A 08/09/2014    Procedure: LEFT HEART CATHETERIZATION WITH CORONARY ANGIOGRAM;  Surgeon: Peter M Martinique, MD;  Location: [facility] CATH LAB;  Service: Cardiovascular;  Laterality: N/A;  . Percutaneous coronary stent intervention (pci-s)  08/09/2014    Procedure: PERCUTANEOUS CORONARY  STENT INTERVENTION (PCI-S);  Surgeon: Peter M Martinique, MD;  Location: [facility] CATH LAB;  Service: Cardiovascular;;      Medication List       This list is accurate as of: 09/08/14 11:59 PM.  Always use your most recent med list.               acetaminophen 325 MG tablet  Commonly known as:  TYLENOL  Take 650 mg by mouth every 6 (six) hours as needed for mild pain.     albuterol 108 (90 BASE) MCG/ACT inhaler  Commonly known as:  PROVENTIL HFA;VENTOLIN HFA  Inhale 2 puffs into the lungs every 4 (four) hours as needed for wheezing or shortness of breath.     aspirin EC 81 MG tablet  Take 81 mg by mouth daily.     atorvastatin 20 MG tablet  Commonly known as:  LIPITOR  Take 1 tablet (20 mg total) by mouth daily at 6 PM.     bisoprolol 5 MG tablet  Commonly known as:  ZEBETA  Take 1 tablet (5 mg total) by mouth daily.     ferrous sulfate 325 (65 FE) MG tablet  take 1 tablet by mouth twice a day     hydroxypropyl methylcellulose / hypromellose 2.5 % ophthalmic solution  Commonly known as:  ISOPTO TEARS / GONIOVISC  Place 1 drop into the left eye 4 (four) times daily as needed for  dry eyes.     isosorbide mononitrate 30 MG 24 hr tablet  Commonly known as:  IMDUR  Take 1 tablet (30 mg total) by mouth daily.     omeprazole 20 MG capsule  Commonly known as:  PRILOSEC  Take 1 capsule (20 mg total) by mouth 2 (two) times daily before a meal.     potassium chloride SA 20 MEQ tablet  Commonly known as:  K-DUR,KLOR-CON  take 1 tablet by mouth once daily     ticagrelor 90 MG Tabs tablet  Commonly known as:  BRILINTA  Take 1 tablet (90 mg total) by mouth 2 (two) times daily.        No orders of the defined types were placed in this encounter.    Immunization History  Administered Date(s) Administered  . Influenza Whole 03/28/2008  . Influenza,inj,Quad PF,36+ Mos 08/09/2013  . Pneumococcal Polysaccharide-23 09/18/2012    History  Substance Use Topics  . Smoking status:  Former Smoker    Types: Cigarettes    Quit date: 07/02/1979  . Smokeless tobacco: Former Systems developer  . Alcohol Use: No    Review of Systems  DATA OBTAINED: from nurse, pt GENERAL:  no fevers, fatigue, appetite changes SKIN: scalp - it was discovered today that pt was wearing a hairpiece/wig; when it was removed a lesion was noted, pt denies any sx HEENT: No complaint RESPIRATORY: No cough, wheezing, SOB CARDIAC: No chest pain, palpitations, lower extremity edema  GI: No abdominal pain, No N/V/D or constipation, No heartburn or reflux  GU: No dysuria, frequency or urgency, or incontinence  MUSCULOSKELETAL: No unrelieved bone/joint pain NEUROLOGIC: No headache, dizziness  PSYCHIATRIC: No overt anxiety or sadness  Filed Vitals:   09/08/14 1512  BP: 132/76  Pulse: 69  Temp: 98 F (36.7 C)  Resp: 20    Physical Exam  GENERAL APPEARANCE: Alert, conversant,BF No acute distress  SKIN: scalp -lesion at vertex approx 6 cm by 2 cm, raised, brown, cobbled appearance, not on red base; no hair growth in or around it HEENT: Unremarkable RESPIRATORY: Breathing is even, unlabored. Lung sounds are clear   CARDIOVASCULAR: Heart RRR no murmurs, rubs or gallops. No peripheral edema  GASTROINTESTINAL: Abdomen is soft, non-tender, not distended w/ normal bowel sounds.  GENITOURINARY: Bladder non tender, not distended  MUSCULOSKELETAL: No abnormal joints or musculature NEUROLOGIC: Cranial nerves 2-12 grossly intact. Moves all extremities PSYCHIATRIC: dementia, no behavioral issues  Patient Active Problem List   Diagnosis Date Noted  . COPD (chronic obstructive pulmonary disease) 08/13/2014  . NSTEMI (non-ST elevated myocardial infarction) 08/07/2014  . Seborrheic keratoses 08/05/2014  . Dyspnea 05/31/2014  . Tachycardia 05/31/2014  . Dyspnea on exertion 05/31/2014  . Dementia 04/28/2014  . Rectal bleeding 09/09/2013  . Hemorrhoids 09/09/2013  . Rash and nonspecific skin eruption 08/09/2013   . Lid retraction of left eye 08/09/2013  . Pain of left calf 03/03/2013  . Left upper arm pain 01/16/2013  . Decreased dorsalis pedis pulse 01/11/2013  . Heart failure with preserved ejection fraction 12/27/2012  . Unstable balance 12/20/2012  . Ectropion of left lower eyelid 12/17/2012  . Onychomycosis of toenail 09/18/2012  . Chronic venous insufficiency b/l lower extremities 09/18/2012  . Preventative health care 09/18/2012  . AORTIC REGURGITATION, MODERATE 04/29/2008  . VITAMIN D DEFICIENCY 04/25/2008  . C V A / STROKE 10/29/2007  . GERD 10/29/2007  . HYPERLIPIDEMIA 09/24/2006  . Anemia 09/24/2006  . ANXIETY 09/24/2006  . Essential hypertension 09/24/2006  . DIVERTICULOSIS, COLON  09/24/2006  . OSTEOARTHRITIS 09/24/2006  . OSTEOPENIA 09/24/2006  . CONSTIPATION, CHRONIC, HX OF 09/24/2006  . VITAMIN B12 DEFICIENCY 08/29/2006    CBC    Component Value Date/Time   WBC 4.8 08/10/2014 0426   RBC 3.05* 08/10/2014 0426   HGB 9.5* 08/10/2014 0426   HCT 28.3* 08/10/2014 0426   PLT 149* 08/10/2014 0426   MCV 92.8 08/10/2014 0426   LYMPHSABS 0.8 08/07/2014 0501   MONOABS 0.5 08/07/2014 0501   EOSABS 0.0 08/07/2014 0501   BASOSABS 0.0 08/07/2014 0501    CMP     Component Value Date/Time   NA 141 08/10/2014 0426   K 4.4 08/10/2014 0426   CL 114* 08/10/2014 0426   CO2 23 08/10/2014 0426   GLUCOSE 119* 08/10/2014 0426   BUN 14 08/10/2014 0426   CREATININE 1.13* 08/10/2014 0426   CREATININE 0.90 03/11/2013 1522   CALCIUM 8.5 08/10/2014 0426   PROT 7.2 08/06/2014 0709   ALBUMIN 3.8 08/06/2014 0709   AST 21 08/06/2014 0709   ALT 11 08/06/2014 0709   ALKPHOS 77 08/06/2014 0709   BILITOT 0.8 08/06/2014 0709   GFRNONAA 44* 08/10/2014 0426   GFRNONAA 61 03/03/2013 1634   GFRAA 51* 08/10/2014 0426   GFRAA 71 03/03/2013 1634    Assessment and Plan  SKIN LESION ON SCALP- hyperkeratotoic lesion from constant rubbing? Consult to dermatology made  Hennie Duos,  MD

## 2014-09-09 DIAGNOSIS — M79674 Pain in right toe(s): Secondary | ICD-10-CM | POA: Diagnosis not present

## 2014-09-09 DIAGNOSIS — B351 Tinea unguium: Secondary | ICD-10-CM | POA: Diagnosis not present

## 2014-09-09 DIAGNOSIS — M79675 Pain in left toe(s): Secondary | ICD-10-CM | POA: Diagnosis not present

## 2014-09-09 DIAGNOSIS — L089 Local infection of the skin and subcutaneous tissue, unspecified: Secondary | ICD-10-CM | POA: Diagnosis not present

## 2014-09-11 ENCOUNTER — Encounter: Payer: Self-pay | Admitting: Internal Medicine

## 2014-09-15 ENCOUNTER — Non-Acute Institutional Stay (SKILLED_NURSING_FACILITY): Payer: Medicare Other | Admitting: Internal Medicine

## 2014-09-15 ENCOUNTER — Encounter: Payer: Self-pay | Admitting: Internal Medicine

## 2014-09-15 DIAGNOSIS — F0390 Unspecified dementia without behavioral disturbance: Secondary | ICD-10-CM

## 2014-09-15 DIAGNOSIS — D638 Anemia in other chronic diseases classified elsewhere: Secondary | ICD-10-CM | POA: Diagnosis not present

## 2014-09-15 DIAGNOSIS — I872 Venous insufficiency (chronic) (peripheral): Secondary | ICD-10-CM

## 2014-09-15 DIAGNOSIS — K219 Gastro-esophageal reflux disease without esophagitis: Secondary | ICD-10-CM | POA: Diagnosis not present

## 2014-09-15 DIAGNOSIS — I5030 Unspecified diastolic (congestive) heart failure: Secondary | ICD-10-CM

## 2014-09-15 DIAGNOSIS — I110 Hypertensive heart disease with heart failure: Secondary | ICD-10-CM | POA: Diagnosis not present

## 2014-09-15 DIAGNOSIS — I509 Heart failure, unspecified: Secondary | ICD-10-CM

## 2014-09-15 DIAGNOSIS — J439 Emphysema, unspecified: Secondary | ICD-10-CM | POA: Diagnosis not present

## 2014-09-15 DIAGNOSIS — I214 Non-ST elevation (NSTEMI) myocardial infarction: Secondary | ICD-10-CM

## 2014-09-15 DIAGNOSIS — I359 Nonrheumatic aortic valve disorder, unspecified: Secondary | ICD-10-CM

## 2014-09-15 NOTE — Progress Notes (Signed)
MRN: [id number] Name: Jane [name]  Sex: female Age: 81 y.o. DOB: [birth year]  [facility]ned #: [facility] Facility/Room: 112A Level Of Care: SNF Provider: Inocencio Homes D Emergency Contacts: Extended Emergency Contact Information Primary Emergency Contact: Macadoo,Michael Address: [address] [address] [place] Phone: [phone] Relation: Son Secondary Emergency Contact: Tatum,Cleatus  [place] Phone: [phone] Relation: Sister  Code Status:FULL   Allergies: Review of patient's allergies indicates no known allergies.  Chief Complaint  Patient presents with  . Discharge Note    HPI: Patient is 81 y.o. female who was hospitalized for NSTEMI, s/p PTCA, admitted to SNF for OT/PT who is now ready to go home.  Past Medical History  Diagnosis Date  . Anemia   . Anxiety   . Arthritis   . GERD (gastroesophageal reflux disease) 2009    EGD with benign gastric polyp too  . Hyperlipidemia   . Hypertension   . Vitamin D deficiency   . Colon polyp     2009 colonoscopy, not retrieved for pathology  . Hiatal hernia   . Diverticulosis 2009  . Aortic insufficiency     mild  . Stroke 1975  . CKD (chronic kidney disease) stage 3, GFR 30-59 ml/min   . CHF (congestive heart failure)   . CAD (coronary artery disease)     cath 08/09/2014 95% stenosis in prox to mid RCA s/p DES, 80-90% prox OM2, 50% distal LAD    Past Surgical History  Procedure Laterality Date  . Appendectomy    . Abdominal hysterectomy    . Artery biopsy Left 12/16/2012    Procedure: BIOPSY TEMPORAL ARTERY;  Surgeon: Mal Misty, MD;  Location: [facility];  Service: Vascular;  Laterality: Left;  . Left heart catheterization with coronary angiogram N/A 08/09/2014    Procedure: LEFT HEART CATHETERIZATION WITH CORONARY ANGIOGRAM;  Surgeon: Peter M Martinique, MD;  Location: [facility] CATH LAB;  Service: Cardiovascular;  Laterality: N/A;  . Percutaneous coronary stent  intervention (pci-s)  08/09/2014    Procedure: PERCUTANEOUS CORONARY STENT INTERVENTION (PCI-S);  Surgeon: Peter M Martinique, MD;  Location: [facility] CATH LAB;  Service: Cardiovascular;;      Medication List       This list is accurate as of: 09/15/14  2:19 PM.  Always use your most recent med list.               acetaminophen 325 MG tablet  Commonly known as:  TYLENOL  Take 650 mg by mouth every 6 (six) hours as needed for mild pain.     albuterol 108 (90 BASE) MCG/ACT inhaler  Commonly known as:  PROVENTIL HFA;VENTOLIN HFA  Inhale 2 puffs into the lungs every 4 (four) hours as needed for wheezing or shortness of breath.     aspirin EC 81 MG tablet  Take 81 mg by mouth daily.     atorvastatin 20 MG tablet  Commonly known as:  LIPITOR  Take 1 tablet (20 mg total) by mouth daily at 6 PM.     bisoprolol 5 MG tablet  Commonly known as:  ZEBETA  Take 1 tablet (5 mg total) by mouth daily.     ferrous sulfate 325 (65 FE) MG tablet  take 1 tablet by mouth twice a day     hydroxypropyl methylcellulose / hypromellose 2.5 % ophthalmic solution  Commonly known as:  ISOPTO TEARS / GONIOVISC  Place 1 drop into  the left eye 4 (four) times daily as needed for dry eyes.     isosorbide mononitrate 30 MG 24 hr tablet  Commonly known as:  IMDUR  Take 1 tablet (30 mg total) by mouth daily.     losartan 50 MG tablet  Commonly known as:  COZAAR  Take 75 mg by mouth daily.     omeprazole 20 MG capsule  Commonly known as:  PRILOSEC  Take 1 capsule (20 mg total) by mouth 2 (two) times daily before a meal.     potassium chloride SA 20 MEQ tablet  Commonly known as:  K-DUR,KLOR-CON  take 1 tablet by mouth once daily     ticagrelor 90 MG Tabs tablet  Commonly known as:  BRILINTA  Take 1 tablet (90 mg total) by mouth 2 (two) times daily.        Meds ordered this encounter  Medications  . losartan (COZAAR) 50 MG tablet    Sig: Take 75 mg by mouth daily.    Immunization History   Administered Date(s) Administered  . Influenza Whole 03/28/2008  . Influenza,inj,Quad PF,36+ Mos 08/09/2013  . Pneumococcal Polysaccharide-23 09/18/2012    History  Substance Use Topics  . Smoking status: Former Smoker    Types: Cigarettes    Quit date: 07/02/1979  . Smokeless tobacco: Former Systems developer  . Alcohol Use: No    Filed Vitals:   09/15/14 1404  BP: 137/63  Pulse: 64  Temp: 98 F (36.7 C)  Resp: 18    Physical Exam  GENERAL APPEARANCE: Alert, No acute distress.  HEENT: Unremarkable. RESPIRATORY: Breathing is even, unlabored. Lung sounds are clear   CARDIOVASCULAR: Heart RRR no murmurs, rubs or gallops. No peripheral edema.  GASTROINTESTINAL: Abdomen is soft, non-tender, not distended w/ normal bowel sounds.  NEUROLOGIC: Cranial nerves 2-12 grossly intact.  Patient Active Problem List   Diagnosis Date Noted  . COPD (chronic obstructive pulmonary disease) 08/13/2014  . NSTEMI (non-ST elevated myocardial infarction) 08/07/2014  . Seborrheic keratoses 08/05/2014  . Dyspnea 05/31/2014  . Tachycardia 05/31/2014  . Dyspnea on exertion 05/31/2014  . Dementia 04/28/2014  . Rectal bleeding 09/09/2013  . Hemorrhoids 09/09/2013  . Rash and nonspecific skin eruption 08/09/2013  . Lid retraction of left eye 08/09/2013  . Pain of left calf 03/03/2013  . Left upper arm pain 01/16/2013  . Decreased dorsalis pedis pulse 01/11/2013  . Heart failure with preserved ejection fraction 12/27/2012  . Unstable balance 12/20/2012  . Ectropion of left lower eyelid 12/17/2012  . Onychomycosis of toenail 09/18/2012  . Chronic venous insufficiency b/l lower extremities 09/18/2012  . Preventative health care 09/18/2012  . Aortic valve disorder 04/29/2008  . VITAMIN D DEFICIENCY 04/25/2008  . C V A / STROKE 10/29/2007  . GERD 10/29/2007  . HYPERLIPIDEMIA 09/24/2006  . Anemia, chronic disease 09/24/2006  . ANXIETY 09/24/2006  . Hypertensive heart disease with congestive heart  failure 09/24/2006  . DIVERTICULOSIS, COLON 09/24/2006  . OSTEOARTHRITIS 09/24/2006  . OSTEOPENIA 09/24/2006  . CONSTIPATION, CHRONIC, HX OF 09/24/2006  . VITAMIN B12 DEFICIENCY 08/29/2006    CBC    Component Value Date/Time   WBC 4.8 08/10/2014 0426   RBC 3.05* 08/10/2014 0426   HGB 9.5* 08/10/2014 0426   HCT 28.3* 08/10/2014 0426   PLT 149* 08/10/2014 0426   MCV 92.8 08/10/2014 0426   LYMPHSABS 0.8 08/07/2014 0501   MONOABS 0.5 08/07/2014 0501   EOSABS 0.0 08/07/2014 0501   BASOSABS 0.0 08/07/2014 0501  CMP     Component Value Date/Time   NA 141 08/10/2014 0426   K 4.4 08/10/2014 0426   CL 114* 08/10/2014 0426   CO2 23 08/10/2014 0426   GLUCOSE 119* 08/10/2014 0426   BUN 14 08/10/2014 0426   CREATININE 1.13* 08/10/2014 0426   CREATININE 0.90 03/11/2013 1522   CALCIUM 8.5 08/10/2014 0426   PROT 7.2 08/06/2014 0709   ALBUMIN 3.8 08/06/2014 0709   AST 21 08/06/2014 0709   ALT 11 08/06/2014 0709   ALKPHOS 77 08/06/2014 0709   BILITOT 0.8 08/06/2014 0709   GFRNONAA 44* 08/10/2014 0426   GFRNONAA 61 03/03/2013 1634   GFRAA 51* 08/10/2014 0426   GFRAA 71 03/03/2013 1634    Assessment and Plan  Pt is stable for d/c to home with HH/OT/PT.  Hennie Duos, MD

## 2014-09-21 ENCOUNTER — Ambulatory Visit: Payer: Medicare Other | Admitting: Internal Medicine

## 2014-09-28 ENCOUNTER — Telehealth: Payer: Self-pay | Admitting: Internal Medicine

## 2014-09-28 NOTE — Telephone Encounter (Signed)
Call to patient to confirm appointment for 09/29/14 at 1:45 no answer

## 2014-09-29 ENCOUNTER — Encounter: Payer: Self-pay | Admitting: Internal Medicine

## 2014-09-29 ENCOUNTER — Ambulatory Visit (INDEPENDENT_AMBULATORY_CARE_PROVIDER_SITE_OTHER): Payer: Medicare Other | Admitting: Internal Medicine

## 2014-09-29 VITALS — BP 136/56 | HR 60 | Temp 97.5°F | Ht 65.0 in | Wt 161.3 lb

## 2014-09-29 DIAGNOSIS — I10 Essential (primary) hypertension: Secondary | ICD-10-CM

## 2014-09-29 DIAGNOSIS — H02105 Unspecified ectropion of left lower eyelid: Secondary | ICD-10-CM

## 2014-09-29 DIAGNOSIS — I110 Hypertensive heart disease with heart failure: Secondary | ICD-10-CM

## 2014-09-29 DIAGNOSIS — L821 Other seborrheic keratosis: Secondary | ICD-10-CM

## 2014-09-29 DIAGNOSIS — I509 Heart failure, unspecified: Secondary | ICD-10-CM | POA: Diagnosis not present

## 2014-09-29 LAB — BASIC METABOLIC PANEL WITH GFR
BUN: 57 mg/dL — AB (ref 6–23)
CHLORIDE: 110 meq/L (ref 96–112)
CO2: 17 meq/L — AB (ref 19–32)
Calcium: 9.4 mg/dL (ref 8.4–10.5)
Creat: 1.66 mg/dL — ABNORMAL HIGH (ref 0.50–1.10)
GFR, Est African American: 33 mL/min — ABNORMAL LOW
GFR, Est Non African American: 28 mL/min — ABNORMAL LOW
GLUCOSE: 82 mg/dL (ref 70–99)
POTASSIUM: 4.1 meq/L (ref 3.5–5.3)
SODIUM: 140 meq/L (ref 135–145)

## 2014-09-29 MED ORDER — FUROSEMIDE 20 MG PO TABS: 20.0000 mg | ORAL_TABLET | Freq: Two times a day (BID) | ORAL | Status: DC

## 2014-09-29 MED ORDER — TICAGRELOR 90 MG PO TABS: 90.0000 mg | ORAL_TABLET | Freq: Two times a day (BID) | ORAL | Status: DC

## 2014-09-29 NOTE — Progress Notes (Signed)
Subjective:    Patient ID: Jane [name], female    DOB: [birth year], 81 y.o.   MRN: [id number]  HPI Comments: Jane [name] is an 81 year old woman with PMH as below here for follow-up after recent 1 month stay in SNF [facility]).  She feels she is doing well back at home.  She has c/o left eye has been more red lately with blood on lower lid.  Please see problem based charting for details.    Past Medical History  Diagnosis Date  . Anemia   . Anxiety   . Arthritis   . GERD (gastroesophageal reflux disease) 2009    EGD with benign gastric polyp too  . Hyperlipidemia   . Hypertension   . Vitamin D deficiency   . Colon polyp     2009 colonoscopy, not retrieved for pathology  . Hiatal hernia   . Diverticulosis 2009  . Aortic insufficiency     mild  . Stroke 1975  . CKD (chronic kidney disease) stage 3, GFR 30-59 ml/min   . CHF (congestive heart failure)   . CAD (coronary artery disease)     cath 08/09/2014 95% stenosis in prox to mid RCA s/p DES, 80-90% prox OM2, 50% distal LAD    Review of Systems  Constitutional: Negative for fever, chills and appetite change.  Eyes: Positive for redness and itching. Negative for pain and visual disturbance.       No foreign body sensation. Occasional itching.  Respiratory: Positive for shortness of breath.        Occasional DOE but has improved since last admission.  Cardiovascular: Negative for chest pain and leg swelling.  Gastrointestinal: Negative for nausea, vomiting, diarrhea and blood in stool.  Genitourinary: Negative for dysuria, frequency and hematuria.  Neurological: Negative for syncope and light-headedness.  Psychiatric/Behavioral: Negative for dysphoric mood.       Filed Vitals:   09/29/14 1439 09/29/14 1610  BP: 155/45 136/56  Pulse: 77 60  Temp: 97.5 F (36.4 C)   TempSrc: Oral   Height: 5\' 5"  (1.651 m)   Weight: 161 lb 4.8 oz (73.165 kg)   SpO2: 98%     Objective:   Physical Exam  Constitutional: She is  oriented to person, place, and time. She appears well-developed. No distress.  HENT:  Head: Normocephalic.  Mouth/Throat: Oropharynx is clear and moist. No oropharyngeal exudate.  Round, bronze-colored, hyperkeratotic lesion ~ 1inch diameter at the top her scalp.  The area is not tender to palpation.  Eyes: EOM are normal. Pupils are equal, round, and reactive to light. Right eye exhibits no discharge. Left eye exhibits no discharge. No scleral icterus.  Left lid ectropion.  The conjunctiva is erythematous.  Neck: Neck supple.  Cardiovascular: Normal rate, regular rhythm and normal heart sounds.  Exam reveals no gallop and no friction rub.   No murmur heard. Pulmonary/Chest: Effort normal and breath sounds normal. No respiratory distress. She has no wheezes. She has no rales.  Abdominal: Soft. Bowel sounds are normal. She exhibits no distension and no mass. There is no tenderness. There is no rebound and no guarding.  Musculoskeletal: Normal range of motion. She exhibits no edema or tenderness.  Neurological: She is alert and oriented to person, place, and time. No cranial nerve deficit.  Skin: Skin is warm. She is not diaphoretic.  Psychiatric: She has a normal mood and affect. Her behavior is normal.  Vitals reviewed.         Assessment &  Plan:  Please see problem based assessment and plan.

## 2014-09-29 NOTE — Patient Instructions (Signed)
1. Please continue your eye drops.  Do not put your hands in your eye.  Try a warm compress to your eye as needed.  We will call you with your eye doctor appointment.  We will also call you with your skin doctor appointment to treat that hard area on your scalp.   2. Please take all medications as prescribed.    3. If you have worsening of your symptoms or new symptoms arise, please call the clinic ([phone]), or go to the ER immediately if symptoms are severe.

## 2014-09-30 DIAGNOSIS — F0390 Unspecified dementia without behavioral disturbance: Secondary | ICD-10-CM | POA: Diagnosis not present

## 2014-09-30 DIAGNOSIS — I110 Hypertensive heart disease with heart failure: Secondary | ICD-10-CM | POA: Diagnosis not present

## 2014-09-30 DIAGNOSIS — I358 Other nonrheumatic aortic valve disorders: Secondary | ICD-10-CM | POA: Diagnosis not present

## 2014-09-30 DIAGNOSIS — J449 Chronic obstructive pulmonary disease, unspecified: Secondary | ICD-10-CM | POA: Diagnosis not present

## 2014-09-30 DIAGNOSIS — I214 Non-ST elevation (NSTEMI) myocardial infarction: Secondary | ICD-10-CM | POA: Diagnosis not present

## 2014-09-30 DIAGNOSIS — I872 Venous insufficiency (chronic) (peripheral): Secondary | ICD-10-CM | POA: Diagnosis not present

## 2014-09-30 DIAGNOSIS — Z8673 Personal history of transient ischemic attack (TIA), and cerebral infarction without residual deficits: Secondary | ICD-10-CM | POA: Diagnosis not present

## 2014-09-30 DIAGNOSIS — D638 Anemia in other chronic diseases classified elsewhere: Secondary | ICD-10-CM | POA: Diagnosis not present

## 2014-09-30 DIAGNOSIS — K219 Gastro-esophageal reflux disease without esophagitis: Secondary | ICD-10-CM | POA: Diagnosis not present

## 2014-09-30 DIAGNOSIS — J439 Emphysema, unspecified: Secondary | ICD-10-CM | POA: Diagnosis not present

## 2014-09-30 DIAGNOSIS — M1990 Unspecified osteoarthritis, unspecified site: Secondary | ICD-10-CM | POA: Diagnosis not present

## 2014-09-30 DIAGNOSIS — N183 Chronic kidney disease, stage 3 (moderate): Secondary | ICD-10-CM | POA: Diagnosis not present

## 2014-09-30 DIAGNOSIS — I509 Heart failure, unspecified: Secondary | ICD-10-CM | POA: Diagnosis not present

## 2014-09-30 NOTE — Assessment & Plan Note (Addendum)
Eyelid is red and irritated.  Eye itself appear normal and there is no discharge, foreign body sensation or visual change.  The patient continues to use artificial tears.  She says she does not pur her hand in her eyes but will occasionally rub just below her eye.   She reports intact vision.  She is not sure of the last time she saw ophthalmologist. - continue artificial tears - apply warm compress to the eye prn - referral to ophthalmologist

## 2014-09-30 NOTE — Assessment & Plan Note (Signed)
Hyperkeratotic lesion at top of scalp c/w with SK and noted on exam by PCP last month.  The lesion is not painful.   - will refer to dermatology for further evaluation and possibly freezing with liquid nitro

## 2014-09-30 NOTE — Assessment & Plan Note (Signed)
DOE has improved somewhat since intervention during Feb admission.  Lasix and losartan were held at hospital d/c in Feb due to contrast received during cath and slight bump in Cr.  Review of SNF notes indicates that Lasix and ACEI were continued but I do not see where labs were repeated.  Patient has brought med bottles which include Lasix 20mg  BID, losartan 50mg  daily (not 75mg ), Zebeta 5mg  daily, Imdur 30mg  daily, ASA 81mg  and Brillinta 90mg  (rx bottle reads "take once daily" instead of twice daily), lipitor 20mg  daily.   - check BMP today and adjust meds if Cr still elevated (otherwise continue meds as above) - patient instructed to take Brilinta twice per day (I also wrote this on the pill bottle and reviewed it with her) and she voiced understanding

## 2014-10-03 NOTE — Progress Notes (Signed)
I saw and evaluated the patient. I personally confirmed the key portions of Dr. Wilson's history and exam and reviewed pertinent patient test results. The assessment, diagnosis, and plan were formulated together and I agree with the documentation in the resident's note. 

## 2014-10-04 DIAGNOSIS — I214 Non-ST elevation (NSTEMI) myocardial infarction: Secondary | ICD-10-CM | POA: Diagnosis not present

## 2014-10-04 DIAGNOSIS — Z8673 Personal history of transient ischemic attack (TIA), and cerebral infarction without residual deficits: Secondary | ICD-10-CM | POA: Diagnosis not present

## 2014-10-04 DIAGNOSIS — N183 Chronic kidney disease, stage 3 (moderate): Secondary | ICD-10-CM | POA: Diagnosis not present

## 2014-10-04 DIAGNOSIS — I110 Hypertensive heart disease with heart failure: Secondary | ICD-10-CM | POA: Diagnosis not present

## 2014-10-04 DIAGNOSIS — J449 Chronic obstructive pulmonary disease, unspecified: Secondary | ICD-10-CM | POA: Diagnosis not present

## 2014-10-04 DIAGNOSIS — I509 Heart failure, unspecified: Secondary | ICD-10-CM | POA: Diagnosis not present

## 2014-10-05 ENCOUNTER — Telehealth: Payer: Self-pay | Admitting: *Deleted

## 2014-10-05 DIAGNOSIS — I509 Heart failure, unspecified: Secondary | ICD-10-CM | POA: Diagnosis not present

## 2014-10-05 DIAGNOSIS — N183 Chronic kidney disease, stage 3 (moderate): Secondary | ICD-10-CM | POA: Diagnosis not present

## 2014-10-05 DIAGNOSIS — I214 Non-ST elevation (NSTEMI) myocardial infarction: Secondary | ICD-10-CM | POA: Diagnosis not present

## 2014-10-05 DIAGNOSIS — J449 Chronic obstructive pulmonary disease, unspecified: Secondary | ICD-10-CM | POA: Diagnosis not present

## 2014-10-05 DIAGNOSIS — Z8673 Personal history of transient ischemic attack (TIA), and cerebral infarction without residual deficits: Secondary | ICD-10-CM | POA: Diagnosis not present

## 2014-10-05 DIAGNOSIS — I110 Hypertensive heart disease with heart failure: Secondary | ICD-10-CM | POA: Diagnosis not present

## 2014-10-05 NOTE — Telephone Encounter (Signed)
Call to patient informed her per Dr. Redmond Pulling to stop taking her Lasix and Losartan.  Jane [name] has an appointment scheduled for 10/10/2014 at 2:15 PM for follow up.  Pt voiced understanding of the plan and repeated the instructions.  Sander Nephew, RN 10/05/2014 2:54 PM

## 2014-10-06 DIAGNOSIS — I509 Heart failure, unspecified: Secondary | ICD-10-CM | POA: Diagnosis not present

## 2014-10-06 DIAGNOSIS — I214 Non-ST elevation (NSTEMI) myocardial infarction: Secondary | ICD-10-CM | POA: Diagnosis not present

## 2014-10-06 DIAGNOSIS — Z8673 Personal history of transient ischemic attack (TIA), and cerebral infarction without residual deficits: Secondary | ICD-10-CM | POA: Diagnosis not present

## 2014-10-06 DIAGNOSIS — N183 Chronic kidney disease, stage 3 (moderate): Secondary | ICD-10-CM | POA: Diagnosis not present

## 2014-10-06 DIAGNOSIS — J449 Chronic obstructive pulmonary disease, unspecified: Secondary | ICD-10-CM | POA: Diagnosis not present

## 2014-10-06 DIAGNOSIS — I110 Hypertensive heart disease with heart failure: Secondary | ICD-10-CM | POA: Diagnosis not present

## 2014-10-07 DIAGNOSIS — J449 Chronic obstructive pulmonary disease, unspecified: Secondary | ICD-10-CM | POA: Diagnosis not present

## 2014-10-07 DIAGNOSIS — I509 Heart failure, unspecified: Secondary | ICD-10-CM | POA: Diagnosis not present

## 2014-10-07 DIAGNOSIS — Z8673 Personal history of transient ischemic attack (TIA), and cerebral infarction without residual deficits: Secondary | ICD-10-CM | POA: Diagnosis not present

## 2014-10-07 DIAGNOSIS — I110 Hypertensive heart disease with heart failure: Secondary | ICD-10-CM | POA: Diagnosis not present

## 2014-10-07 DIAGNOSIS — N183 Chronic kidney disease, stage 3 (moderate): Secondary | ICD-10-CM | POA: Diagnosis not present

## 2014-10-07 DIAGNOSIS — I214 Non-ST elevation (NSTEMI) myocardial infarction: Secondary | ICD-10-CM | POA: Diagnosis not present

## 2014-10-08 DIAGNOSIS — R0602 Shortness of breath: Secondary | ICD-10-CM | POA: Diagnosis not present

## 2014-10-10 ENCOUNTER — Ambulatory Visit (INDEPENDENT_AMBULATORY_CARE_PROVIDER_SITE_OTHER): Payer: Medicare Other | Admitting: Internal Medicine

## 2014-10-10 ENCOUNTER — Emergency Department (HOSPITAL_COMMUNITY): Payer: Medicare Other

## 2014-10-10 ENCOUNTER — Encounter (HOSPITAL_COMMUNITY): Payer: Self-pay | Admitting: Family Medicine

## 2014-10-10 ENCOUNTER — Emergency Department (HOSPITAL_COMMUNITY)
Admission: EM | Admit: 2014-10-10 | Discharge: 2014-10-10 | Disposition: A | Discharge status: Home / Self Care | Payer: Medicare Other | Attending: Emergency Medicine | Admitting: Emergency Medicine

## 2014-10-10 ENCOUNTER — Encounter: Payer: Self-pay | Admitting: Internal Medicine

## 2014-10-10 VITALS — BP 136/45 | HR 57 | Temp 98.0°F | Ht 65.0 in | Wt 161.5 lb

## 2014-10-10 DIAGNOSIS — I509 Heart failure, unspecified: Secondary | ICD-10-CM | POA: Diagnosis not present

## 2014-10-10 DIAGNOSIS — K219 Gastro-esophageal reflux disease without esophagitis: Secondary | ICD-10-CM | POA: Insufficient documentation

## 2014-10-10 DIAGNOSIS — H578 Other specified disorders of eye and adnexa: Secondary | ICD-10-CM | POA: Insufficient documentation

## 2014-10-10 DIAGNOSIS — I129 Hypertensive chronic kidney disease with stage 1 through stage 4 chronic kidney disease, or unspecified chronic kidney disease: Secondary | ICD-10-CM | POA: Diagnosis not present

## 2014-10-10 DIAGNOSIS — R0602 Shortness of breath: Secondary | ICD-10-CM

## 2014-10-10 DIAGNOSIS — I110 Hypertensive heart disease with heart failure: Secondary | ICD-10-CM | POA: Diagnosis present

## 2014-10-10 DIAGNOSIS — M1990 Unspecified osteoarthritis, unspecified site: Secondary | ICD-10-CM | POA: Diagnosis not present

## 2014-10-10 DIAGNOSIS — Z8673 Personal history of transient ischemic attack (TIA), and cerebral infarction without residual deficits: Secondary | ICD-10-CM | POA: Insufficient documentation

## 2014-10-10 DIAGNOSIS — Z86010 Personal history of colonic polyps: Secondary | ICD-10-CM | POA: Diagnosis not present

## 2014-10-10 DIAGNOSIS — D649 Anemia, unspecified: Secondary | ICD-10-CM | POA: Insufficient documentation

## 2014-10-10 DIAGNOSIS — Z87891 Personal history of nicotine dependence: Secondary | ICD-10-CM | POA: Insufficient documentation

## 2014-10-10 DIAGNOSIS — I2510 Atherosclerotic heart disease of native coronary artery without angina pectoris: Secondary | ICD-10-CM | POA: Insufficient documentation

## 2014-10-10 DIAGNOSIS — N183 Chronic kidney disease, stage 3 (moderate): Secondary | ICD-10-CM | POA: Diagnosis not present

## 2014-10-10 DIAGNOSIS — Z7902 Long term (current) use of antithrombotics/antiplatelets: Secondary | ICD-10-CM | POA: Diagnosis not present

## 2014-10-10 DIAGNOSIS — Z8659 Personal history of other mental and behavioral disorders: Secondary | ICD-10-CM | POA: Insufficient documentation

## 2014-10-10 DIAGNOSIS — Z9889 Other specified postprocedural states: Secondary | ICD-10-CM | POA: Diagnosis not present

## 2014-10-10 DIAGNOSIS — Z7982 Long term (current) use of aspirin: Secondary | ICD-10-CM | POA: Insufficient documentation

## 2014-10-10 DIAGNOSIS — Z79899 Other long term (current) drug therapy: Secondary | ICD-10-CM | POA: Insufficient documentation

## 2014-10-10 DIAGNOSIS — E785 Hyperlipidemia, unspecified: Secondary | ICD-10-CM | POA: Diagnosis not present

## 2014-10-10 LAB — TROPONIN I: Troponin I: <0.03 ng/mL (ref <0.031)

## 2014-10-10 LAB — I-STAT VENOUS BLOOD GAS, ED
Acid-base deficit: 5 mmol/L — ABNORMAL HIGH (ref 0.0–2.0)
BICARBONATE: 20.8 meq/L (ref 20.0–24.0)
O2 Saturation: 34 %
PH VEN: 7.338 — AB (ref 7.250–7.300)
TCO2: 22 mmol/L (ref 0–100)
pCO2, Ven: 38.8 mmHg — ABNORMAL LOW (ref 45.0–50.0)
pO2, Ven: 22 mmHg — CL (ref 30.0–45.0)

## 2014-10-10 LAB — CBC WITH DIFFERENTIAL/PLATELET
BASOS PCT: 0 % (ref 0–1)
Basophils Absolute: 0 10*3/uL (ref 0.0–0.1)
EOS ABS: 0 10*3/uL (ref 0.0–0.7)
EOS PCT: 0 % (ref 0–5)
HCT: 29.4 % — ABNORMAL LOW (ref 36.0–46.0)
HEMOGLOBIN: 9.7 g/dL — AB (ref 12.0–15.0)
LYMPHS ABS: 1.6 10*3/uL (ref 0.7–4.0)
Lymphocytes Relative: 36 % (ref 12–46)
MCH: 31.3 pg (ref 26.0–34.0)
MCHC: 33 g/dL (ref 30.0–36.0)
MCV: 94.8 fL (ref 78.0–100.0)
MONOS PCT: 7 % (ref 3–12)
Monocytes Absolute: 0.3 10*3/uL (ref 0.1–1.0)
Neutro Abs: 2.6 10*3/uL (ref 1.7–7.7)
Neutrophils Relative %: 57 % (ref 43–77)
Platelets: 142 10*3/uL — ABNORMAL LOW (ref 150–400)
RBC: 3.1 MIL/uL — ABNORMAL LOW (ref 3.87–5.11)
RDW: 14 % (ref 11.5–15.5)
WBC: 4.6 10*3/uL (ref 4.0–10.5)

## 2014-10-10 LAB — BASIC METABOLIC PANEL
Anion gap: 11 (ref 5–15)
BUN: 25 mg/dL — AB (ref 6–23)
CHLORIDE: 114 mmol/L — AB (ref 96–112)
CO2: 16 mmol/L — ABNORMAL LOW (ref 19–32)
Calcium: 9.1 mg/dL (ref 8.4–10.5)
Creatinine, Ser: 1.17 mg/dL — ABNORMAL HIGH (ref 0.50–1.10)
GFR calc Af Amer: 49 mL/min — ABNORMAL LOW (ref >90)
GFR calc non Af Amer: 42 mL/min — ABNORMAL LOW (ref >90)
GLUCOSE: 86 mg/dL (ref 70–99)
Potassium: 4.8 mmol/L (ref 3.5–5.1)
Sodium: 141 mmol/L (ref 135–145)

## 2014-10-10 LAB — BRAIN NATRIURETIC PEPTIDE: B Natriuretic Peptide: 310.6 pg/mL — ABNORMAL HIGH (ref 0.0–100.0)

## 2014-10-10 NOTE — Progress Notes (Signed)
Case discussed with Dr. Wilson at the time of the visit.  We reviewed the resident's history and exam and pertinent patient test results.  I agree with the assessment, diagnosis, and plan of care documented in the resident's note. 

## 2014-10-10 NOTE — ED Notes (Signed)
Dr. Rees at bedside  

## 2014-10-10 NOTE — Patient Instructions (Signed)
1. Please DO NOT take furosemide (Lasix) or losartan (Cozaar) for now.  I would like to recheck your kidney function in 2 weeks.   If you kidney function has improved at that time we need to start back one of these medications at a low dose.   2. Please take all medications as prescribed.    3. If you have worsening of your symptoms or new symptoms arise, please call the clinic ([phone]), or go to the ER immediately if symptoms are severe.  Come back to clinic in 2 weeks for labwork.

## 2014-10-10 NOTE — ED Notes (Signed)
Pt presents from home where she lives alone via [place] with c/o left eye bleeding and increased shortness of breath.  Pt has been seen before for both of these issues.  Pt reports she awoke this morning with blood around her left eye - coming from the inside of the lower lid.  Pt also reports this morning she felt more short of breath than she normally is.  Respiratory rate appears even and unlabored and no active bleeding is noted, though lid does appear reddened.

## 2014-10-10 NOTE — ED Notes (Signed)
Pt ambulated in the hallway with assistance from myself, pt o2 98% room air, heart rate 94

## 2014-10-10 NOTE — Discharge Instructions (Signed)
Present directly to your family doctor's office.   Shortness of Breath Shortness of breath means you have trouble breathing. It could also mean that you have a medical problem. You should get immediate medical care for shortness of breath. CAUSES   Not enough oxygen in the air such as with high altitudes or a smoke-filled room.  Certain lung diseases, infections, or problems.  Heart disease or conditions, such as angina or heart failure.  Low red blood cells (anemia).  Poor physical fitness, which can cause shortness of breath when you exercise.  Chest or back injuries or stiffness.  Being overweight.  Smoking.  Anxiety, which can make you feel like you are not getting enough air. DIAGNOSIS  Serious medical problems can often be found during your physical exam. Tests may also be done to determine why you are having shortness of breath. Tests may include:  Chest X-rays.  Lung function tests.  Blood tests.  An electrocardiogram (ECG).  An ambulatory electrocardiogram. An ambulatory ECG records your heartbeat patterns over a 24-hour period.  Exercise testing.  A transthoracic echocardiogram (TTE). During echocardiography, sound waves are used to evaluate how blood flows through your heart.  A transesophageal echocardiogram (TEE).  Imaging scans. Your health care provider may not be able to find a cause for your shortness of breath after your exam. In this case, it is important to have a follow-up exam with your health care provider as directed.  TREATMENT  Treatment for shortness of breath depends on the cause of your symptoms and can vary greatly. HOME CARE INSTRUCTIONS   Do not smoke. Smoking is a common cause of shortness of breath. If you smoke, ask for help to quit.  Avoid being around chemicals or things that may bother your breathing, such as paint fumes and dust.  Rest as needed. Slowly resume your usual activities.  If medicines were prescribed, take them as  directed for the full length of time directed. This includes oxygen and any inhaled medicines.  Keep all follow-up appointments as directed by your health care provider. SEEK MEDICAL CARE IF:   Your condition does not improve in the time expected.  You have a hard time doing your normal activities even with rest.  You have any new symptoms. SEEK IMMEDIATE MEDICAL CARE IF:   Your shortness of breath gets worse.  You feel light-headed, faint, or develop a cough not controlled with medicines.  You start coughing up blood.  You have pain with breathing.  You have chest pain or pain in your arms, shoulders, or abdomen.  You have a fever.  You are unable to walk up stairs or exercise the way you normally do. MAKE SURE YOU:  Understand these instructions.  Will watch your condition.  Will get help right away if you are not doing well or get worse. Document Released: 03/12/2001 Document Revised: 06/22/2013 Document Reviewed: 09/02/2011 [facility] Patient Information 2015 [place], [place]. This information is not intended to replace advice given to you by your health care provider. Make sure you discuss any questions you have with your health care provider.

## 2014-10-10 NOTE — ED Notes (Signed)
Dr. Ralene Bathe aware of VBG results.

## 2014-10-10 NOTE — ED Notes (Signed)
Pt comfortable with discharge and follow up instructions. Yvetta Coder NT to wheel patient to [facility] aware of patient coming to them from the ER.  Pt states OK with plan of care.

## 2014-10-10 NOTE — ED Provider Notes (Signed)
CSN: [id number]     Arrival date & time 10/10/14  0914 History   First MD Initiated Contact with Patient 10/10/14 [phone]     Chief Complaint  Patient presents with  . Eye Drainage  . Shortness of Breath     Patient is a 81 y.o. female presenting with shortness of breath. The history is provided by the patient. No language interpreter was used.  Shortness of Breath  Jane [name] presents for evaluation of shortness of breath and bleeding from her left eye. In terms of shortness of breath, she states this is an ongoing problem but it was worse this morning. She always has intermittent dyspnea on exertion. When she got up to use the bathroom this morning she had significant dyspnea on exertion. She currently feels better. She denies any associated chest pain, fevers, cough, leg swelling or pain. She also noticed that she had some bleeding from her left eye this morning. When she woke up her eye felt wet and she went to wipe and there was blood present. She's had bleeding from the site previously but this was more quantity than before. She states that she always has a droop in that eye  and she has difficulty opening and closing and fully. Symptoms have improved since they began.  Past Medical History  Diagnosis Date  . Anemia   . Anxiety   . Arthritis   . GERD (gastroesophageal reflux disease) 2009    EGD with benign gastric polyp too  . Hyperlipidemia   . Hypertension   . Vitamin D deficiency   . Colon polyp     2009 colonoscopy, not retrieved for pathology  . Hiatal hernia   . Diverticulosis 2009  . Aortic insufficiency     mild  . Stroke 1975  . CKD (chronic kidney disease) stage 3, GFR 30-59 ml/min   . CHF (congestive heart failure)   . CAD (coronary artery disease)     cath 08/09/2014 95% stenosis in prox to mid RCA s/p DES, 80-90% prox OM2, 50% distal LAD   Past Surgical History  Procedure Laterality Date  . Appendectomy    . Abdominal hysterectomy    . Artery biopsy Left  12/16/2012    Procedure: BIOPSY TEMPORAL ARTERY;  Surgeon: Mal Misty, MD;  Location: [facility];  Service: Vascular;  Laterality: Left;  . Left heart catheterization with coronary angiogram N/A 08/09/2014    Procedure: LEFT HEART CATHETERIZATION WITH CORONARY ANGIOGRAM;  Surgeon: Peter M Martinique, MD;  Location[facility] CATH LAB;  Service: Cardiovascular;  Laterality: N/A;  . Percutaneous coronary stent intervention (pci-s)  08/09/2014    Procedure: PERCUTANEOUS CORONARY STENT INTERVENTION (PCI-S);  Surgeon: Peter M Martinique, MD;  Location: [facility] CATH LAB;  Service: Cardiovascular;;   Family History  Problem Relation Age of Onset  . Stroke Mother   . Hypertension Mother   . Stroke Father   . Hypertension Father   . Diabetes Sister    History  Substance Use Topics  . Smoking status: Former Smoker    Types: Cigarettes    Quit date: 07/02/1979  . Smokeless tobacco: Former Systems developer  . Alcohol Use: No   OB History    No data available     Review of Systems  Respiratory: Positive for shortness of breath.   All other systems reviewed and are negative.     Allergies  Review of patient's allergies indicates no known allergies.  Home Medications   Prior to Admission medications   Medication  Sig Start Date End Date Taking? Authorizing Provider  acetaminophen (TYLENOL) 325 MG tablet Take 650 mg by mouth every 6 (six) hours as needed for mild pain.    Historical Provider, MD  albuterol (PROVENTIL HFA;VENTOLIN HFA) 108 (90 BASE) MCG/ACT inhaler Inhale 2 puffs into the lungs every 4 (four) hours as needed for wheezing or shortness of breath. Patient not taking: Reported on 09/29/2014 08/05/14   Luan Moore, MD  aspirin EC 81 MG tablet Take 81 mg by mouth daily.    Historical Provider, MD  atorvastatin (LIPITOR) 20 MG tablet Take 1 tablet (20 mg total) by mouth daily at 6 PM. 08/10/14   Francesca Oman, DO  bisoprolol (ZEBETA) 5 MG tablet Take 1 tablet (5 mg total) by mouth daily. 08/10/14   Francesca Oman, DO   ferrous sulfate 325 (65 FE) MG tablet take 1 tablet by mouth twice a day 05/10/14   Luan Moore, MD  furosemide (LASIX) 20 MG tablet Take 1 tablet (20 mg total) by mouth 2 (two) times daily. 09/29/14   Francesca Oman, DO  hydroxypropyl methylcellulose / hypromellose (ISOPTO TEARS / GONIOVISC) 2.5 % ophthalmic solution Place 1 drop into the left eye 4 (four) times daily as needed for dry eyes. 04/12/14   Noland Fordyce, PA-C  isosorbide mononitrate (IMDUR) 30 MG 24 hr tablet Take 1 tablet (30 mg total) by mouth daily. 08/10/14   Francesca Oman, DO  losartan (COZAAR) 50 MG tablet Take 75 mg by mouth daily.    Historical Provider, MD  omeprazole (PRILOSEC) 20 MG capsule Take 1 capsule (20 mg total) by mouth 2 (two) times daily before a meal. 04/12/14   Noland Fordyce, PA-C  potassium chloride SA (K-DUR,KLOR-CON) 20 MEQ tablet take 1 tablet by mouth once daily 08/03/14   Luan Moore, MD  ticagrelor (BRILINTA) 90 MG TABS tablet Take 1 tablet (90 mg total) by mouth 2 (two) times daily. 09/29/14   Francesca Oman, DO   BP 137/57 mmHg  Pulse 53  Temp(Src) 98.7 F (37.1 C) (Oral)  Resp 14  SpO2 100% Physical Exam  Constitutional: She is oriented to person, place, and time. She appears well-developed and well-nourished. No distress.  HENT:  Head: Normocephalic and atraumatic.  Eyes:  PERRL.  EOMI.  There is a droop of the left lower eyelid.  The conjunctival is injected and edematous on the left lower lid with mucosal friability, no active bleeding.    Cardiovascular: Normal rate and regular rhythm.   No murmur heard. Pulmonary/Chest: Effort normal and breath sounds normal. No respiratory distress.  Abdominal: Soft. There is no tenderness. There is no rebound and no guarding.  Musculoskeletal: She exhibits no edema or tenderness.  Neurological: She is alert and oriented to person, place, and time.  Skin: Skin is warm and dry.  Psychiatric: She has a normal mood and affect. Her behavior is normal.   Nursing note and vitals reviewed.   ED Course  Procedures (including critical care time) Labs Review Labs Reviewed  BASIC METABOLIC PANEL - Abnormal; Notable for the following:    Chloride 114 (*)    CO2 16 (*)    BUN 25 (*)    Creatinine, Ser 1.17 (*)    GFR calc non Af Amer 42 (*)    GFR calc Af Amer 49 (*)    All other components within normal limits  CBC WITH DIFFERENTIAL/PLATELET - Abnormal; Notable for the following:    RBC 3.10 (*)  Hemoglobin 9.7 (*)    HCT 29.4 (*)    Platelets 142 (*)    All other components within normal limits  BRAIN NATRIURETIC PEPTIDE - Abnormal; Notable for the following:    B Natriuretic Peptide 310.6 (*)    All other components within normal limits  I-STAT VENOUS BLOOD GAS, ED - Abnormal; Notable for the following:    pH, Ven 7.338 (*)    pCO2, Ven 38.8 (*)    pO2, Ven 22.0 (*)    Acid-base deficit 5.0 (*)    All other components within normal limits  TROPONIN I  BLOOD GAS, VENOUS    Imaging Review Dg Chest 2 View  10/10/2014   CLINICAL DATA:  Shortness of breath for 2 days  EXAM: CHEST  2 VIEW  COMPARISON:  08/07/2014  FINDINGS: Cardiac shadow is at the upper limits of normal in size. The lungs are well aerated bilaterally. No focal infiltrate or sizable effusion is seen. No acute bony abnormality is noted.  IMPRESSION: No acute abnormality seen.   Electronically Signed   By: Inez Catalina M.D.   On: 10/10/2014 10:18     EKG Interpretation   Date/Time:  Monday October 10 2014 09:24:47 EDT Ventricular Rate:  53 PR Interval:  143 QRS Duration: 84 QT Interval:  447 QTC Calculation: 420 R Axis:   22 Text Interpretation:  Sinus rhythm Atrial premature complex Low voltage,  precordial leads Confirmed by Hazle Coca [phone]) on 10/10/2014 9:39:50 AM      MDM   Final diagnoses:  Shortness of breath    Patient here for evaluation of shortness of breath and bleeding from her eye. Patient no distress that department. There is no  evidence of ACS, PE, dissection. BNP mildly elevated from priors that patient has been off her Lasix. BMP with stable to improving renal insufficiency. Her bicarbonate is similar compared to priors and there is no evidence of significant acidosis and gas. Discussed with patient follow-up with family practice office she made to restart her Lasix seen. In terms of bleeding from her eye, this has stopped the department. The eyelid had become dry from it's drooping in lack of blinking. Discussed need to follow-up with ophthalmology as originally planned.    Quintella Reichert, MD 10/10/14 1355

## 2014-10-10 NOTE — ED Notes (Addendum)
Spoke with Dr. Redmond Pulling in [facility] - states will see patient at 1415.  Cab Voucher obtained for patient - pt states has no other ride home and is not able to ambulate up to bus stop without her walker.

## 2014-10-10 NOTE — Progress Notes (Signed)
   Subjective:    Patient ID: Jane [name], female    DOB: 02/25/1929, 81 y.o.   MRN: [id number]  HPI Comments: Jane [name] is here for follow-up of her BP after Lasix and losartan were discontinued due to AKI.  Please see problem based charting for A&P.    Past Medical History  Diagnosis Date  . Anemia   . Anxiety   . Arthritis   . GERD (gastroesophageal reflux disease) 2009    EGD with benign gastric polyp too  . Hyperlipidemia   . Hypertension   . Vitamin D deficiency   . Colon polyp     2009 colonoscopy, not retrieved for pathology  . Hiatal hernia   . Diverticulosis 2009  . Aortic insufficiency     mild  . Stroke 1975  . CKD (chronic kidney disease) stage 3, GFR 30-59 ml/min   . CHF (congestive heart failure)   . CAD (coronary artery disease)     cath 08/09/2014 95% stenosis in prox to mid RCA s/p DES, 80-90% prox OM2, 50% distal LAD    Review of Systems  Constitutional: Negative for fever, chills and appetite change.  Respiratory: Positive for shortness of breath.        No PND, orthopnea.  Cardiovascular: Negative for chest pain, palpitations and leg swelling.  Gastrointestinal: Negative for nausea, vomiting, abdominal pain, diarrhea and abdominal distention.  Genitourinary: Negative for dysuria and hematuria.       Filed Vitals:   10/10/14 1435 10/10/14 1558  BP: 153/62 136/45  Pulse: 65 57  Temp: 98 F (36.7 C)   TempSrc: Oral   Height: 5\' 5"  (1.651 m)   Weight: 161 lb 8 oz (73.256 kg)   SpO2: 100%      Objective:   Physical Exam  Constitutional: She is oriented to person, place, and time. She appears well-developed. No distress.  HENT:  Head: Normocephalic and atraumatic.  Mouth/Throat: Oropharynx is clear and moist. No oropharyngeal exudate.  Eyes: EOM are normal. Pupils are equal, round, and reactive to light.  Left lid ectropion. The conjunctiva is erythematous.   Neck: Neck supple.  Cardiovascular: Normal rate, regular rhythm and normal  heart sounds.  Exam reveals no gallop and no friction rub.   No murmur heard. Pulmonary/Chest: Effort normal and breath sounds normal. No respiratory distress. She has no wheezes. She has no rales.  Abdominal: Soft. Bowel sounds are normal. She exhibits no distension and no mass. There is no tenderness. There is no rebound and no guarding.  Musculoskeletal: Normal range of motion. She exhibits no edema or tenderness.  Neurological: She is alert and oriented to person, place, and time. No cranial nerve deficit.  Skin: Skin is warm. She is not diaphoretic.  Psychiatric: She has a normal mood and affect. Her behavior is normal.  Vitals reviewed.         Assessment & Plan:  Please see problem based charting for A&P.

## 2014-10-10 NOTE — ED Notes (Signed)
Lab results reported to Nurse Raquel Sarna.

## 2014-10-11 ENCOUNTER — Other Ambulatory Visit: Payer: Self-pay | Admitting: Internal Medicine

## 2014-10-11 DIAGNOSIS — I110 Hypertensive heart disease with heart failure: Secondary | ICD-10-CM | POA: Diagnosis not present

## 2014-10-11 DIAGNOSIS — N183 Chronic kidney disease, stage 3 (moderate): Secondary | ICD-10-CM | POA: Diagnosis not present

## 2014-10-11 DIAGNOSIS — I214 Non-ST elevation (NSTEMI) myocardial infarction: Secondary | ICD-10-CM | POA: Diagnosis not present

## 2014-10-11 DIAGNOSIS — J449 Chronic obstructive pulmonary disease, unspecified: Secondary | ICD-10-CM | POA: Diagnosis not present

## 2014-10-11 DIAGNOSIS — Z8673 Personal history of transient ischemic attack (TIA), and cerebral infarction without residual deficits: Secondary | ICD-10-CM | POA: Diagnosis not present

## 2014-10-11 DIAGNOSIS — I509 Heart failure, unspecified: Secondary | ICD-10-CM | POA: Diagnosis not present

## 2014-10-11 NOTE — Assessment & Plan Note (Signed)
Jane [name] was seen in ED this AM for dyspnea.  She had negative evaluation and was going to be discharged home when ED provider realized she had an appt with [facility] this afternoon.  Labs in the ED show AKI has improved off of Lasix and Losartan.  Jane [name] is hemodynamically stable.  Her BP is ok and she appears euvolemic (weight is actually down 5 pounds since last hospital d/c, she has no leg edema, clear lungs) so I will continue to hold Lasix and Losartan until renal function is back to baseline.  - BMP and BP check in two weeks; if Cr back to baseline, then would recommend resuming ACEI or ARB at lowest dose and continue to hold Lasix unless she is volume up.

## 2014-10-12 DIAGNOSIS — N183 Chronic kidney disease, stage 3 (moderate): Secondary | ICD-10-CM | POA: Diagnosis not present

## 2014-10-12 DIAGNOSIS — I110 Hypertensive heart disease with heart failure: Secondary | ICD-10-CM | POA: Diagnosis not present

## 2014-10-12 DIAGNOSIS — I214 Non-ST elevation (NSTEMI) myocardial infarction: Secondary | ICD-10-CM | POA: Diagnosis not present

## 2014-10-12 DIAGNOSIS — Z8673 Personal history of transient ischemic attack (TIA), and cerebral infarction without residual deficits: Secondary | ICD-10-CM | POA: Diagnosis not present

## 2014-10-12 DIAGNOSIS — J449 Chronic obstructive pulmonary disease, unspecified: Secondary | ICD-10-CM | POA: Diagnosis not present

## 2014-10-12 DIAGNOSIS — I509 Heart failure, unspecified: Secondary | ICD-10-CM | POA: Diagnosis not present

## 2014-10-13 DIAGNOSIS — J449 Chronic obstructive pulmonary disease, unspecified: Secondary | ICD-10-CM | POA: Diagnosis not present

## 2014-10-13 DIAGNOSIS — I110 Hypertensive heart disease with heart failure: Secondary | ICD-10-CM | POA: Diagnosis not present

## 2014-10-13 DIAGNOSIS — I214 Non-ST elevation (NSTEMI) myocardial infarction: Secondary | ICD-10-CM | POA: Diagnosis not present

## 2014-10-13 DIAGNOSIS — I509 Heart failure, unspecified: Secondary | ICD-10-CM | POA: Diagnosis not present

## 2014-10-13 DIAGNOSIS — N183 Chronic kidney disease, stage 3 (moderate): Secondary | ICD-10-CM | POA: Diagnosis not present

## 2014-10-13 DIAGNOSIS — Z8673 Personal history of transient ischemic attack (TIA), and cerebral infarction without residual deficits: Secondary | ICD-10-CM | POA: Diagnosis not present

## 2014-10-14 ENCOUNTER — Emergency Department (HOSPITAL_COMMUNITY)
Admission: EM | Admit: 2014-10-14 | Discharge: 2014-10-14 | Disposition: A | Discharge status: Home / Self Care | Payer: Medicare Other | Attending: Emergency Medicine | Admitting: Emergency Medicine

## 2014-10-14 ENCOUNTER — Encounter (HOSPITAL_COMMUNITY): Payer: Self-pay | Admitting: Emergency Medicine

## 2014-10-14 ENCOUNTER — Emergency Department (HOSPITAL_COMMUNITY): Payer: Medicare Other

## 2014-10-14 DIAGNOSIS — K219 Gastro-esophageal reflux disease without esophagitis: Secondary | ICD-10-CM | POA: Diagnosis not present

## 2014-10-14 DIAGNOSIS — Z86010 Personal history of colonic polyps: Secondary | ICD-10-CM | POA: Insufficient documentation

## 2014-10-14 DIAGNOSIS — Z7902 Long term (current) use of antithrombotics/antiplatelets: Secondary | ICD-10-CM | POA: Insufficient documentation

## 2014-10-14 DIAGNOSIS — E785 Hyperlipidemia, unspecified: Secondary | ICD-10-CM | POA: Diagnosis not present

## 2014-10-14 DIAGNOSIS — I129 Hypertensive chronic kidney disease with stage 1 through stage 4 chronic kidney disease, or unspecified chronic kidney disease: Secondary | ICD-10-CM | POA: Diagnosis not present

## 2014-10-14 DIAGNOSIS — D649 Anemia, unspecified: Secondary | ICD-10-CM | POA: Insufficient documentation

## 2014-10-14 DIAGNOSIS — Z87891 Personal history of nicotine dependence: Secondary | ICD-10-CM | POA: Insufficient documentation

## 2014-10-14 DIAGNOSIS — I509 Heart failure, unspecified: Secondary | ICD-10-CM | POA: Insufficient documentation

## 2014-10-14 DIAGNOSIS — Z7982 Long term (current) use of aspirin: Secondary | ICD-10-CM | POA: Diagnosis not present

## 2014-10-14 DIAGNOSIS — J42 Unspecified chronic bronchitis: Secondary | ICD-10-CM | POA: Insufficient documentation

## 2014-10-14 DIAGNOSIS — J449 Chronic obstructive pulmonary disease, unspecified: Secondary | ICD-10-CM | POA: Diagnosis not present

## 2014-10-14 DIAGNOSIS — Z79899 Other long term (current) drug therapy: Secondary | ICD-10-CM | POA: Insufficient documentation

## 2014-10-14 DIAGNOSIS — N183 Chronic kidney disease, stage 3 (moderate): Secondary | ICD-10-CM | POA: Diagnosis not present

## 2014-10-14 DIAGNOSIS — R0600 Dyspnea, unspecified: Secondary | ICD-10-CM

## 2014-10-14 DIAGNOSIS — I2510 Atherosclerotic heart disease of native coronary artery without angina pectoris: Secondary | ICD-10-CM | POA: Insufficient documentation

## 2014-10-14 DIAGNOSIS — Z8673 Personal history of transient ischemic attack (TIA), and cerebral infarction without residual deficits: Secondary | ICD-10-CM | POA: Diagnosis not present

## 2014-10-14 DIAGNOSIS — M1990 Unspecified osteoarthritis, unspecified site: Secondary | ICD-10-CM | POA: Insufficient documentation

## 2014-10-14 DIAGNOSIS — R0602 Shortness of breath: Secondary | ICD-10-CM | POA: Diagnosis not present

## 2014-10-14 DIAGNOSIS — Z8659 Personal history of other mental and behavioral disorders: Secondary | ICD-10-CM | POA: Diagnosis not present

## 2014-10-14 LAB — HEPATIC FUNCTION PANEL
ALK PHOS: 81 U/L (ref 39–117)
ALT: 16 U/L (ref 0–35)
AST: 20 U/L (ref 0–37)
Albumin: 3.6 g/dL (ref 3.5–5.2)
BILIRUBIN TOTAL: 0.4 mg/dL (ref 0.3–1.2)
Bilirubin, Direct: 0.1 mg/dL (ref 0.0–0.5)
Indirect Bilirubin: 0.3 mg/dL (ref 0.3–0.9)
Total Protein: 6.7 g/dL (ref 6.0–8.3)

## 2014-10-14 LAB — BASIC METABOLIC PANEL
Anion gap: 11 (ref 5–15)
BUN: 23 mg/dL (ref 6–23)
CO2: 16 mmol/L — AB (ref 19–32)
Calcium: 9.1 mg/dL (ref 8.4–10.5)
Chloride: 114 mmol/L — ABNORMAL HIGH (ref 96–112)
Creatinine, Ser: 1.22 mg/dL — ABNORMAL HIGH (ref 0.50–1.10)
GFR calc Af Amer: 46 mL/min — ABNORMAL LOW (ref >90)
GFR, EST NON AFRICAN AMERICAN: 40 mL/min — AB (ref >90)
Glucose, Bld: 87 mg/dL (ref 70–99)
Potassium: 4.3 mmol/L (ref 3.5–5.1)
Sodium: 141 mmol/L (ref 135–145)

## 2014-10-14 LAB — CBC
HCT: 26.4 % — ABNORMAL LOW (ref 36.0–46.0)
HEMOGLOBIN: 8.9 g/dL — AB (ref 12.0–15.0)
MCH: 31.6 pg (ref 26.0–34.0)
MCHC: 33.7 g/dL (ref 30.0–36.0)
MCV: 93.6 fL (ref 78.0–100.0)
PLATELETS: 136 10*3/uL — AB (ref 150–400)
RBC: 2.82 MIL/uL — AB (ref 3.87–5.11)
RDW: 14.2 % (ref 11.5–15.5)
WBC: 4.5 10*3/uL (ref 4.0–10.5)

## 2014-10-14 LAB — BRAIN NATRIURETIC PEPTIDE: B Natriuretic Peptide: 288.1 pg/mL — ABNORMAL HIGH (ref 0.0–100.0)

## 2014-10-14 LAB — PROTIME-INR
INR: 1.07 (ref 0.00–1.49)
Prothrombin Time: 14.1 seconds (ref 11.6–15.2)

## 2014-10-14 LAB — I-STAT TROPONIN, ED: TROPONIN I, POC: 0.01 ng/mL (ref 0.00–0.08)

## 2014-10-14 LAB — TROPONIN I: Troponin I: <0.03 ng/mL (ref <0.031)

## 2014-10-14 NOTE — ED Notes (Signed)
Patient unable to void 

## 2014-10-14 NOTE — ED Notes (Signed)
Pt removed from bedpan, no urine output. MD at bedside, ok with it.

## 2014-10-14 NOTE — ED Notes (Signed)
Pt arrives via EMS with c/o [facility] ongoing for about a week or so, unclear per patient, woke up with worsened this morning. Does have MDI, unsure of when last used.

## 2014-10-14 NOTE — ED Notes (Signed)
Moving patient to C29 to hold until family can be reached to come pick her up for discharge.

## 2014-10-14 NOTE — Discharge Instructions (Signed)

## 2014-10-14 NOTE — ED Provider Notes (Signed)
CSN: [id number]     Arrival date & time 10/14/14  7035 History   First MD Initiated Contact with Patient 10/14/14 0450     Chief Complaint  Patient presents with  . Shortness of Breath     (Consider location/radiation/quality/duration/timing/severity/associated sxs/prior Treatment) HPI The patient reports that she gets episodes of shortness of breath. She reports it occurs more frequently when she lies down. She denies any associated chest pain. She denies associated lower extremity swelling. She denies fever, cough or chills. She reports she has had similar symptoms in the past. She reports she has an inhaler but it doesn't usually help much. Past Medical History  Diagnosis Date  . Anemia   . Anxiety   . Arthritis   . GERD (gastroesophageal reflux disease) 2009    EGD with benign gastric polyp too  . Hyperlipidemia   . Hypertension   . Vitamin D deficiency   . Colon polyp     2009 colonoscopy, not retrieved for pathology  . Hiatal hernia   . Diverticulosis 2009  . Aortic insufficiency     mild  . Stroke 1975  . CKD (chronic kidney disease) stage 3, GFR 30-59 ml/min   . CHF (congestive heart failure)   . CAD (coronary artery disease)     cath 08/09/2014 95% stenosis in prox to mid RCA s/p DES, 80-90% prox OM2, 50% distal LAD   Past Surgical History  Procedure Laterality Date  . Appendectomy    . Abdominal hysterectomy    . Artery biopsy Left 12/16/2012    Procedure: BIOPSY TEMPORAL ARTERY;  Surgeon: Mal Misty, MD;  Location: [place];  Service: Vascular;  Laterality: Left;  . Left heart catheterization with coronary angiogram N/A 08/09/2014    Procedure: LEFT HEART CATHETERIZATION WITH CORONARY ANGIOGRAM;  Surgeon: Peter M Martinique, MD;  Location: [facility] CATH LAB;  Service: Cardiovascular;  Laterality: N/A;  . Percutaneous coronary stent intervention (pci-s)  08/09/2014    Procedure: PERCUTANEOUS CORONARY STENT INTERVENTION (PCI-S);  Surgeon: Peter M Martinique, MD;  Location: [facility] CATH  LAB;  Service: Cardiovascular;;   Family History  Problem Relation Age of Onset  . Stroke Mother   . Hypertension Mother   . Stroke Father   . Hypertension Father   . Diabetes Sister    History  Substance Use Topics  . Smoking status: Former Smoker    Types: Cigarettes    Quit date: 07/02/1979  . Smokeless tobacco: Former Systems developer  . Alcohol Use: No   OB History    No data available     Review of Systems  10 Systems reviewed and are negative for acute change except as noted in the HPI.   Allergies  Review of patient's allergies indicates no known allergies.  Home Medications   Prior to Admission medications   Medication Sig Start Date End Date Taking? Authorizing Provider  albuterol (PROVENTIL HFA;VENTOLIN HFA) 108 (90 BASE) MCG/ACT inhaler Inhale 2 puffs into the lungs every 4 (four) hours as needed for wheezing or shortness of breath. 08/05/14  Yes Luan Moore, MD  acetaminophen (TYLENOL) 325 MG tablet Take 650 mg by mouth every 6 (six) hours as needed for mild pain.    Historical Provider, MD  aspirin EC 81 MG tablet Take 81 mg by mouth daily.    Historical Provider, MD  atorvastatin (LIPITOR) 20 MG tablet Take 1 tablet (20 mg total) by mouth daily at 6 PM. 08/10/14   Francesca Oman, DO  bisoprolol (ZEBETA) 5  MG tablet Take 1 tablet (5 mg total) by mouth daily. 08/10/14   Francesca Oman, DO  ferrous sulfate 325 (65 FE) MG tablet take 1 tablet by mouth twice a day 05/10/14   Luan Moore, MD  hydroxypropyl methylcellulose / hypromellose (ISOPTO TEARS / GONIOVISC) 2.5 % ophthalmic solution Place 1 drop into the left eye 4 (four) times daily as needed for dry eyes. 04/12/14   Noland Fordyce, PA-C  isosorbide mononitrate (IMDUR) 30 MG 24 hr tablet Take 1 tablet (30 mg total) by mouth daily. 08/10/14   Francesca Oman, DO  omeprazole (PRILOSEC) 20 MG capsule Take 1 capsule (20 mg total) by mouth 2 (two) times daily before a meal. 04/12/14   Noland Fordyce, PA-C  potassium chloride SA  (K-DUR,KLOR-CON) 20 MEQ tablet take 1 tablet by mouth once daily 08/03/14   Luan Moore, MD  ticagrelor (BRILINTA) 90 MG TABS tablet Take 1 tablet (90 mg total) by mouth 2 (two) times daily. 09/29/14   Francesca Oman, DO   BP 150/57 mmHg  Pulse 67  Temp(Src) 98.4 F (36.9 C) (Oral)  Resp 15  Wt 161 lb (73.029 kg)  SpO2 100% Physical Exam  Constitutional: She is oriented to person, place, and time. She appears well-developed and well-nourished.  The patient is alert and nontoxic. She is giving full history.  HENT:  Head: Normocephalic and atraumatic.  Right Ear: External ear normal.  Left Ear: External ear normal.  Mouth/Throat: Oropharynx is clear and moist.  Eyes: EOM are normal. Pupils are equal, round, and reactive to light.  Neck: Neck supple.  Cardiovascular: Normal rate, regular rhythm, normal heart sounds and intact distal pulses.   Pulmonary/Chest:  Patient has mild increased work of breathing. She is speaking in full and appropriate sentences. Patient has breath sounds to the bases without gross wheeze rhonchi or rail.  Abdominal: Soft. Bowel sounds are normal. She exhibits no distension. There is no tenderness.  Genitourinary: Guaiac negative stool.  Rectal exam is for formed brown stool in the vault. This tests guaiac negative.  Musculoskeletal: Normal range of motion. She exhibits no edema.  Neurological: She is alert and oriented to person, place, and time. She has normal strength. Coordination normal. GCS eye subscore is 4. GCS verbal subscore is 5. GCS motor subscore is 6.  Skin: Skin is warm, dry and intact.  Psychiatric: She has a normal mood and affect.    ED Course  Procedures (including critical care time) Labs Review Labs Reviewed  BASIC METABOLIC PANEL - Abnormal; Notable for the following:    Chloride 114 (*)    CO2 16 (*)    Creatinine, Ser 1.22 (*)    GFR calc non Af Amer 40 (*)    GFR calc Af Amer 46 (*)    All other components within normal limits   CBC - Abnormal; Notable for the following:    RBC 2.82 (*)    Hemoglobin 8.9 (*)    HCT 26.4 (*)    Platelets 136 (*)    All other components within normal limits  BRAIN NATRIURETIC PEPTIDE - Abnormal; Notable for the following:    B Natriuretic Peptide 288.1 (*)    All other components within normal limits  TROPONIN I  PROTIME-INR  HEPATIC FUNCTION PANEL  I-STAT TROPOININ, ED    Imaging Review Dg Chest 2 View (if Patient Has Fever And/or Copd)  10/14/2014   CLINICAL DATA:  Shortness of breath  EXAM: CHEST  2 VIEW  COMPARISON:  10/10/2014  FINDINGS: There is chronic cardiomegaly and aortic tortuosity. Chronic bronchitic changes with hyperinflation. Apparent increased density over the spine laterally is likely related to rotation and the paravertebral location of the aorta. No pneumonia is suspected when correlated with the frontal projection. No edema, effusion, or pneumothorax.  IMPRESSION: Cardiomegaly and COPD.  No acute superimposed findings.   Electronically Signed   By: Monte Fantasia M.D.   On: 10/14/2014 05:35     EKG Interpretation   Date/Time:  Friday October 14 2014 04:41:42 EDT Ventricular Rate:  70 PR Interval:  103 QRS Duration: 119 QT Interval:  453 QTC Calculation: 489 R Axis:   22 Text Interpretation:  Sinus rhythm Short PR interval Incomplete left  bundle branch block Low voltage, precordial leads NO stemi. no change  Confirmed by Johnney Killian, MD, Jeannie Done [phone]) on 10/14/2014 5:24:03 AM      MDM   Final diagnoses:  Dyspnea  Chronic bronchitis, unspecified chronic bronchitis type   Patient presents with report of paroxysmal dyspnea. Review of the EMR indicates the patient has had multiple episodes of dyspnea very similar to today. She is well in appearance. Her diagnostic workup is negative today. There is no indication of acute CHF. The patient is not actively wheezing or hypoxic in at this time does not appear to be having any COPD exacerbation. The patient is  mildly anemic however that is been chronic in nature and her rectal examination does not show any evidence of GI bleeding. At this point he feel patient is safe for continued outpatient evaluation and monitoring.    Charlesetta Shanks, MD 10/14/14 [phone]

## 2014-10-16 ENCOUNTER — Encounter (HOSPITAL_COMMUNITY): Payer: Self-pay | Admitting: Physical Medicine and Rehabilitation

## 2014-10-16 ENCOUNTER — Observation Stay (HOSPITAL_COMMUNITY)
Admission: EM | Admit: 2014-10-16 | Discharge: 2014-10-17 | Disposition: A | Discharge status: Home / Self Care | Payer: Medicare Other | Attending: Oncology | Admitting: Oncology

## 2014-10-16 ENCOUNTER — Emergency Department (HOSPITAL_COMMUNITY): Payer: Medicare Other

## 2014-10-16 DIAGNOSIS — F419 Anxiety disorder, unspecified: Secondary | ICD-10-CM | POA: Diagnosis not present

## 2014-10-16 DIAGNOSIS — Z86010 Personal history of colonic polyps: Secondary | ICD-10-CM | POA: Insufficient documentation

## 2014-10-16 DIAGNOSIS — I509 Heart failure, unspecified: Principal | ICD-10-CM | POA: Insufficient documentation

## 2014-10-16 DIAGNOSIS — E785 Hyperlipidemia, unspecified: Secondary | ICD-10-CM | POA: Diagnosis not present

## 2014-10-16 DIAGNOSIS — N183 Chronic kidney disease, stage 3 (moderate): Secondary | ICD-10-CM | POA: Insufficient documentation

## 2014-10-16 DIAGNOSIS — M1990 Unspecified osteoarthritis, unspecified site: Secondary | ICD-10-CM | POA: Insufficient documentation

## 2014-10-16 DIAGNOSIS — Z8673 Personal history of transient ischemic attack (TIA), and cerebral infarction without residual deficits: Secondary | ICD-10-CM | POA: Insufficient documentation

## 2014-10-16 DIAGNOSIS — Z955 Presence of coronary angioplasty implant and graft: Secondary | ICD-10-CM | POA: Diagnosis not present

## 2014-10-16 DIAGNOSIS — J449 Chronic obstructive pulmonary disease, unspecified: Secondary | ICD-10-CM | POA: Diagnosis not present

## 2014-10-16 DIAGNOSIS — Z90710 Acquired absence of both cervix and uterus: Secondary | ICD-10-CM | POA: Diagnosis not present

## 2014-10-16 DIAGNOSIS — D638 Anemia in other chronic diseases classified elsewhere: Secondary | ICD-10-CM | POA: Diagnosis not present

## 2014-10-16 DIAGNOSIS — H02105 Unspecified ectropion of left lower eyelid: Secondary | ICD-10-CM | POA: Diagnosis present

## 2014-10-16 DIAGNOSIS — I129 Hypertensive chronic kidney disease with stage 1 through stage 4 chronic kidney disease, or unspecified chronic kidney disease: Secondary | ICD-10-CM | POA: Diagnosis not present

## 2014-10-16 DIAGNOSIS — E538 Deficiency of other specified B group vitamins: Secondary | ICD-10-CM | POA: Diagnosis present

## 2014-10-16 DIAGNOSIS — R7303 Prediabetes: Secondary | ICD-10-CM | POA: Diagnosis present

## 2014-10-16 DIAGNOSIS — E559 Vitamin D deficiency, unspecified: Secondary | ICD-10-CM | POA: Diagnosis not present

## 2014-10-16 DIAGNOSIS — I252 Old myocardial infarction: Secondary | ICD-10-CM | POA: Insufficient documentation

## 2014-10-16 DIAGNOSIS — I2510 Atherosclerotic heart disease of native coronary artery without angina pectoris: Secondary | ICD-10-CM | POA: Diagnosis not present

## 2014-10-16 DIAGNOSIS — R0602 Shortness of breath: Secondary | ICD-10-CM | POA: Diagnosis not present

## 2014-10-16 DIAGNOSIS — Z87891 Personal history of nicotine dependence: Secondary | ICD-10-CM | POA: Insufficient documentation

## 2014-10-16 DIAGNOSIS — K219 Gastro-esophageal reflux disease without esophagitis: Secondary | ICD-10-CM | POA: Insufficient documentation

## 2014-10-16 DIAGNOSIS — D649 Anemia, unspecified: Secondary | ICD-10-CM | POA: Diagnosis present

## 2014-10-16 LAB — CBC
HCT: 26.5 % — ABNORMAL LOW (ref 36.0–46.0)
HEMOGLOBIN: 8.9 g/dL — AB (ref 12.0–15.0)
MCH: 31.6 pg (ref 26.0–34.0)
MCHC: 33.6 g/dL (ref 30.0–36.0)
MCV: 94 fL (ref 78.0–100.0)
PLATELETS: 126 10*3/uL — AB (ref 150–400)
RBC: 2.82 MIL/uL — ABNORMAL LOW (ref 3.87–5.11)
RDW: 14.1 % (ref 11.5–15.5)
WBC: 4 10*3/uL (ref 4.0–10.5)

## 2014-10-16 LAB — I-STAT TROPONIN, ED: Troponin i, poc: 0.01 ng/mL (ref 0.00–0.08)

## 2014-10-16 LAB — TROPONIN I: Troponin I: <0.03 ng/mL (ref <0.031)

## 2014-10-16 LAB — BASIC METABOLIC PANEL
Anion gap: 10 (ref 5–15)
BUN: 17 mg/dL (ref 6–23)
CHLORIDE: 111 mmol/L (ref 96–112)
CO2: 19 mmol/L (ref 19–32)
CREATININE: 1.07 mg/dL (ref 0.50–1.10)
Calcium: 8.9 mg/dL (ref 8.4–10.5)
GFR calc Af Amer: 54 mL/min — ABNORMAL LOW (ref >90)
GFR, EST NON AFRICAN AMERICAN: 47 mL/min — AB (ref >90)
Glucose, Bld: 91 mg/dL (ref 70–99)
Potassium: 3.8 mmol/L (ref 3.5–5.1)
Sodium: 140 mmol/L (ref 135–145)

## 2014-10-16 LAB — RETICULOCYTES
RBC.: 2.99 MIL/uL — ABNORMAL LOW (ref 3.87–5.11)
RETIC CT PCT: 1.7 % (ref 0.4–3.1)
Retic Count, Absolute: 50.8 10*3/uL (ref 19.0–186.0)

## 2014-10-16 LAB — IRON AND TIBC
Iron: 75 ug/dL (ref 42–145)
Saturation Ratios: 34 % (ref 20–55)
TIBC: 221 ug/dL — AB (ref 250–470)
UIBC: 146 ug/dL (ref 125–400)

## 2014-10-16 LAB — MAGNESIUM: MAGNESIUM: 1.8 mg/dL (ref 1.5–2.5)

## 2014-10-16 LAB — VITAMIN B12: VITAMIN B 12: 181 pg/mL — AB (ref 211–911)

## 2014-10-16 LAB — BRAIN NATRIURETIC PEPTIDE: B Natriuretic Peptide: 401.2 pg/mL — ABNORMAL HIGH (ref 0.0–100.0)

## 2014-10-16 LAB — PROTIME-INR
INR: 1.12 (ref 0.00–1.49)
Prothrombin Time: 14.5 seconds (ref 11.6–15.2)

## 2014-10-16 LAB — FERRITIN: FERRITIN: 366 ng/mL — AB (ref 10–291)

## 2014-10-16 LAB — TECHNOLOGIST SMEAR REVIEW

## 2014-10-16 LAB — FOLATE: Folate: 18.2 ng/mL

## 2014-10-16 LAB — MRSA PCR SCREENING: MRSA by PCR: NEGATIVE

## 2014-10-16 MED ORDER — FUROSEMIDE 10 MG/ML IJ SOLN
40.0000 mg | Freq: Once | INTRAMUSCULAR | Status: AC
  Administered 2014-10-16: 40 mg via INTRAVENOUS
  Filled 2014-10-16: qty 4

## 2014-10-16 MED ORDER — FERROUS SULFATE 325 (65 FE) MG PO TABS
325.0000 mg | ORAL_TABLET | Freq: Two times a day (BID) | ORAL | Status: DC
  Administered 2014-10-16 – 2014-10-17 (×3): 325 mg via ORAL
  Filled 2014-10-16 (×4): qty 1

## 2014-10-16 MED ORDER — ASPIRIN EC 81 MG PO TBEC
81.0000 mg | DELAYED_RELEASE_TABLET | Freq: Every day | ORAL | Status: DC
  Administered 2014-10-16 – 2014-10-17 (×2): 81 mg via ORAL
  Filled 2014-10-16 (×2): qty 1

## 2014-10-16 MED ORDER — TICAGRELOR 90 MG PO TABS
90.0000 mg | ORAL_TABLET | Freq: Two times a day (BID) | ORAL | Status: DC
  Administered 2014-10-16 – 2014-10-17 (×3): 90 mg via ORAL
  Filled 2014-10-16 (×4): qty 1

## 2014-10-16 MED ORDER — HYPROMELLOSE (GONIOSCOPIC) 2.5 % OP SOLN: 1.0000 [drp] | Freq: Four times a day (QID) | OPHTHALMIC | Status: DC | PRN

## 2014-10-16 MED ORDER — ENOXAPARIN SODIUM 40 MG/0.4ML ~~LOC~~ SOLN
40.0000 mg | SUBCUTANEOUS | Status: DC
  Administered 2014-10-16 – 2014-10-17 (×2): 40 mg via SUBCUTANEOUS
  Filled 2014-10-16 (×2): qty 0.4

## 2014-10-16 MED ORDER — ISOSORBIDE MONONITRATE ER 30 MG PO TB24
30.0000 mg | ORAL_TABLET | Freq: Every day | ORAL | Status: DC
  Administered 2014-10-16 – 2014-10-17 (×2): 30 mg via ORAL
  Filled 2014-10-16 (×2): qty 1

## 2014-10-16 MED ORDER — PANTOPRAZOLE SODIUM 40 MG PO TBEC
40.0000 mg | DELAYED_RELEASE_TABLET | Freq: Every day | ORAL | Status: DC
  Administered 2014-10-16 – 2014-10-17 (×2): 40 mg via ORAL
  Filled 2014-10-16 (×2): qty 1

## 2014-10-16 MED ORDER — BISOPROLOL FUMARATE 5 MG PO TABS
5.0000 mg | ORAL_TABLET | Freq: Every day | ORAL | Status: DC
  Administered 2014-10-16 – 2014-10-17 (×2): 5 mg via ORAL
  Filled 2014-10-16 (×2): qty 1

## 2014-10-16 MED ORDER — ACETAMINOPHEN 325 MG PO TABS: 650.0000 mg | ORAL_TABLET | Freq: Four times a day (QID) | ORAL | Status: DC | PRN

## 2014-10-16 MED ORDER — ATORVASTATIN CALCIUM 20 MG PO TABS
20.0000 mg | ORAL_TABLET | Freq: Every day | ORAL | Status: DC
  Administered 2014-10-16: 20 mg via ORAL
  Filled 2014-10-16 (×2): qty 1

## 2014-10-16 MED ORDER — SODIUM CHLORIDE 0.9 % IJ SOLN
3.0000 mL | Freq: Two times a day (BID) | INTRAMUSCULAR | Status: DC
  Administered 2014-10-16 – 2014-10-17 (×3): 3 mL via INTRAVENOUS

## 2014-10-16 NOTE — H&P (Signed)
Date: 10/16/2014               Patient Name:  Jane [name] MRN: [id number]  DOB: [birth year] Age / Sex: 81 y.o., female   PCP: Luan Moore, MD         Medical Service: Internal Medicine Teaching Service         Attending Physician: Dr. Annia Belt, MD    First Contact: Dr. Genene Churn Pager: [phone]  Second Contact: Dr. Naaman Plummer Pager: [phone]       After Hours (After 5p/  First Contact Pager: [phone]  weekends / holidays): Second Contact Pager: [phone]   Chief Complaint: sob  History of Present Illness:   81 yo female hx of CKD stage 3, CHF, CAD Comes in with sob, worse with exertion, worse lying flat, denies fever/chills/cough. This has been going on for few days, went to the ED few times. Today it was worse, started after walking short distance. She feels swollen around her upper body. Gets SOB with mild exertion and also sometimes with lying flat.    No chest pain, cough, fever, chills, n/v, bleeding. She was told to stop lasix and losartan for AKi recently, was seen by Dr. Redmond Pulling in clinic on 10/10/14. Was euvolemic that time. She went to the ED on 4/11 and also 4/15 with same complaint. BNP was 288 on 10/14/14, was sent home, didn't seem to be in CHF exc that time to EDP. CXR shows chronic interstitial opacity, likely prior CHF. Today her BNP is 401. ED gave her 40mg  IV lasix with good response.  Meds: Current Facility-Administered Medications  Medication Dose Route Frequency Provider Last Rate Last Dose  . acetaminophen (TYLENOL) tablet 650 mg  650 mg Oral Q6H PRN Juluis Mire, MD      . aspirin EC tablet 81 mg  81 mg Oral Daily Marjan Rabbani, MD   81 mg at 10/16/14 1130  . atorvastatin (LIPITOR) tablet 20 mg  20 mg Oral q1800 Marjan Rabbani, MD   20 mg at 10/16/14 1707  . bisoprolol (ZEBETA) tablet 5 mg  5 mg Oral Daily Marjan Rabbani, MD   5 mg at 10/16/14 1348  . enoxaparin (LOVENOX) injection 40 mg  40 mg Subcutaneous Q24H Marjan Rabbani, MD   40 mg at 10/16/14 1348   . ferrous sulfate tablet 325 mg  325 mg Oral BID Juluis Mire, MD   325 mg at 10/16/14 1348  . hydroxypropyl methylcellulose / hypromellose (ISOPTO TEARS / GONIOVISC) 2.5 % ophthalmic solution 1 drop  1 drop Left Eye QID PRN Juluis Mire, MD      . isosorbide mononitrate (IMDUR) 24 hr tablet 30 mg  30 mg Oral Daily Marjan Rabbani, MD   30 mg at 10/16/14 1348  . pantoprazole (PROTONIX) EC tablet 40 mg  40 mg Oral Daily Marjan Rabbani, MD   40 mg at 10/16/14 1347  . sodium chloride 0.9 % injection 3 mL  3 mL Intravenous Q12H Marjan Rabbani, MD   3 mL at 10/16/14 1130  . ticagrelor (BRILINTA) tablet 90 mg  90 mg Oral BID Juluis Mire, MD   90 mg at 10/16/14 1348    Review of Systems  Constitutional: Negative for fever and chills.  HENT: Negative for congestion and sore throat.   Eyes: Positive for redness. Negative for blurred vision.  Respiratory: Positive for shortness of breath. Negative for cough, hemoptysis and wheezing.   Cardiovascular: Negative for chest pain.  Gastrointestinal: Negative for nausea, vomiting, abdominal  pain and diarrhea.  Genitourinary: Negative for dysuria, hematuria and flank pain.  Musculoskeletal: Negative for myalgias, joint pain and falls.  Skin: Negative for rash.  Neurological: Negative for dizziness, seizures and weakness.  Endo/Heme/Allergies: Negative.     Allergies: Allergies as of 10/16/2014  . (No Known Allergies)   Past Medical History  Diagnosis Date  . Anemia   . Anxiety   . Arthritis   . GERD (gastroesophageal reflux disease) 2009    EGD with benign gastric polyp too  . Hyperlipidemia   . Hypertension   . Vitamin D deficiency   . Colon polyp     2009 colonoscopy, not retrieved for pathology  . Hiatal hernia   . Diverticulosis 2009  . Aortic insufficiency     mild  . Stroke 1975  . CKD (chronic kidney disease) stage 3, GFR 30-59 ml/min   . CHF (congestive heart failure)   . CAD (coronary artery disease)     cath 08/09/2014 95%  stenosis in prox to mid RCA s/p DES, 80-90% prox OM2, 50% distal LAD   Past Surgical History  Procedure Laterality Date  . Appendectomy    . Abdominal hysterectomy    . Artery biopsy Left 12/16/2012    Procedure: BIOPSY TEMPORAL ARTERY;  Surgeon: Mal Misty, MD;  Location: [place];  Service: Vascular;  Laterality: Left;  . Left heart catheterization with coronary angiogram N/A 08/09/2014    Procedure: LEFT HEART CATHETERIZATION WITH CORONARY ANGIOGRAM;  Surgeon: Peter M Martinique, MD;  Location[facility] CATH LAB;  Service: Cardiovascular;  Laterality: N/A;  . Percutaneous coronary stent intervention (pci-s)  08/09/2014    Procedure: PERCUTANEOUS CORONARY STENT INTERVENTION (PCI-S);  Surgeon: Peter M Martinique, MD;  Location: [facility] CATH LAB;  Service: Cardiovascular;;   Family History  Problem Relation Age of Onset  . Stroke Mother   . Hypertension Mother   . Stroke Father   . Hypertension Father   . Diabetes Sister    History   Social History  . Marital Status: Widowed    Spouse Name: N/A  . Number of Children: N/A  . Years of Education: N/A   Occupational History  . Not on file.   Social History Main Topics  . Smoking status: Former Smoker    Types: Cigarettes    Quit date: 07/02/1979  . Smokeless tobacco: Former Systems developer  . Alcohol Use: No  . Drug Use: No  . Sexual Activity: No   Other Topics Concern  . Not on file   Social History Narrative    Physical Exam: Blood pressure 147/68, pulse 68, temperature 98.3 F (36.8 C), temperature source Oral, resp. rate 18, height 5\' 5"  (1.651 m), weight 159 lb 9.6 oz (72.394 kg), SpO2 100 %.  Physical Exam  Constitutional: She is oriented to person, place, and time. She appears well-developed and well-nourished. No distress.  HENT:  Head: Normocephalic and atraumatic.  Mouth/Throat: Oropharynx is clear and moist.  Eyes: Pupils are equal, round, and reactive to light.  Left lid ectropion, left eye erythematous.   Neck: Normal range of  motion.  Respiratory: Effort normal and breath sounds normal. No respiratory distress. She has no wheezes. She exhibits no tenderness.  Mild crackles diffusely.  GI: She exhibits no distension. There is no tenderness. There is no rebound.  Musculoskeletal: Normal range of motion. She exhibits no edema or tenderness.  Neurological: She is alert and oriented to person, place, and time. No cranial nerve deficit. Coordination normal.  Skin: She  is not diaphoretic.    Lab results: Basic Metabolic Panel:  Recent Labs  10/14/14 0450 10/16/14 0942 10/16/14 1159  NA 141 140  --   K 4.3 3.8  --   CL 114* 111  --   CO2 16* 19  --   GLUCOSE 87 91  --   BUN 23 17  --   CREATININE 1.22* 1.07  --   CALCIUM 9.1 8.9  --   MG  --   --  1.8   Liver Function Tests:  Recent Labs  10/14/14 0450  AST 20  ALT 16  ALKPHOS 81  BILITOT 0.4  PROT 6.7  ALBUMIN 3.6   No results for input(s): LIPASE, AMYLASE in the last 72 hours. No results for input(s): AMMONIA in the last 72 hours. CBC:  Recent Labs  10/14/14 0450 10/16/14 0942  WBC 4.5 4.0  HGB 8.9* 8.9*  HCT 26.4* 26.5*  MCV 93.6 94.0  PLT 136* 126*   Cardiac Enzymes:  Recent Labs  10/14/14 0450  TROPONINI <0.03   BNP: No results for input(s): PROBNP in the last 72 hours. D-Dimer: No results for input(s): DDIMER in the last 72 hours. CBG: No results for input(s): GLUCAP in the last 72 hours. Hemoglobin A1C: No results for input(s): HGBA1C in the last 72 hours. Fasting Lipid Panel: No results for input(s): CHOL, HDL, LDLCALC, TRIG, CHOLHDL, LDLDIRECT in the last 72 hours. Thyroid Function Tests: No results for input(s): TSH, T4TOTAL, FREET4, T3FREE, THYROIDAB in the last 72 hours. Anemia Panel:  Recent Labs  10/16/14 1159  RETICCTPCT 1.7   Coagulation:  Recent Labs  10/14/14 0450 10/16/14 1159  LABPROT 14.1 14.5  INR 1.07 1.12   Urine Drug Screen: Drugs of Abuse  No results found for: LABOPIA,  COCAINSCRNUR, LABBENZ, AMPHETMU, THCU, LABBARB  Alcohol Level: No results for input(s): ETH in the last 72 hours. Urinalysis: No results for input(s): COLORURINE, LABSPEC, PHURINE, GLUCOSEU, HGBUR, BILIRUBINUR, KETONESUR, PROTEINUR, UROBILINOGEN, NITRITE, LEUKOCYTESUR in the last 72 hours.  Invalid input(s): APPERANCEUR Misc. Labs: Imaging results:  Dg Chest 2 View  10/16/2014   CLINICAL DATA:  81 year old female with a history of chronic shortness of breath. History of smoking, hypertension, congestive heart failure.  EXAM: CHEST - 2 VIEW  COMPARISON:  10/14/2014, 08/07/2014, chest CT 08/06/2014  FINDINGS: Cardiomediastinal silhouette unchanged in size and contour with cardiomegaly.  Atherosclerosis of the aortic arch.  Fullness of the central vasculature, similar to the comparison.  Stigmata of emphysema, with increased retrosternal airspace, flattened hemidiaphragms, increased AP diameter, and hyperinflation on the AP view.  No confluent airspace disease. Chronic interstitial prominence. No pneumothorax. No pleural effusion.  No displaced fracture. Degenerative changes of the bilateral shoulders.  Unremarkable appearance of the upper abdomen.  IMPRESSION: Chronic interstitial opacities, potentially reflecting prior episodes of CHF. Mild interstitial edema not excluded. No pleural effusion or lobar pneumonia.  Cardiomegaly and atherosclerosis.  Changes of emphysema.  Signed,  Dulcy Fanny. Earleen Newport, DO  [facility]  [facility]   Electronically Signed   By: Corrie Mckusick D.O.   On: 10/16/2014 10:03    Other results: EKG: nsr, poor r wave progression. no st changes. Same as prior.  Assessment & Plan by Problem: Active Problems:   Osteoarthritis   Ectropion of left lower eyelid   Dyspnea   COPD (chronic obstructive pulmonary disease)   CHF exacerbation   Prediabetes  81 yo female with CAD, CHF, COPD here with SOB likely 2/2  to CHF exacerbation.   CHF  exacerbation previous ECHO EF 60% 05/2014, by cath 55-65%, - sob worse with lying flat, has been off lasix, no fever/chills, mild crackles b/l. BNP elevated 400's. CXR showing chronic interstitial edema and cardiomegaly, likely 2/2 to not being on lasix. Lasix and losartan were held due to AKI. BNP 288 2 days ago, now 401.  - strict i/o, lasix 40mg  IV once. Will re-assess and give more as needed. - repeat CXR tomorrow. - trend trops, patient is high risk for acs but seem like having acs currently.   CAD - nstemi 08/2014.  Sever 2 vessel obstructive CAD. cath 08/09/2014 95% stenosis in prox to mid RCA s/p DES, 80-90% prox OM2, 50% distal LAD.- - no cp but will trend trops as she is high risk of MI given recent stent. Initial POC trop was negativex2.  - on ASA and brilinta for one year. Cont both.  - cont lipitor 20mg  daily - cont cardiac rehab outpatient.   HTN - cont imdur + bisoprolol - hold losartan for now. May consider restarting as aki resolved. - cont bisoprolol.   CKD stage 3 - had recent aki but now resolved after holding lasix outpatient. - had AKI and lasix and losartan were held. Resolved. Now monitor with lasix.  Chronic anemia - normocytic. previous anemia panel was normal for iron. Hx of vitamin B12 def - Will recheck anemia panel - already on po iron, continue this.  Copd - stable, no wheezing, sob likely from chf rather than copd.  - Cont home albuterol.  HLD - cont lipitor 20mg  daily (not sure why not 40mg  daily?). Will leave upto PCP.  GERD - cont ppi. Ectropion of left lid -hydroxypropyl methylcellulose / hypromellose  Eye drops.    Dispo: Disposition is deferred at this time, awaiting improvement of current medical problems. Anticipated discharge in approximately 1-2 day(s).   The patient does have a current PCP Luan Moore, MD) and does need an [facility] hospital follow-up appointment after discharge.  The patient does have transportation limitations that hinder  transportation to clinic appointments.  Signed: Dellia Nims, MD 10/16/2014, 6:30 PM

## 2014-10-16 NOTE — ED Notes (Signed)
Attempted to call report. Nurse unable to take at the time. To call back.

## 2014-10-16 NOTE — ED Provider Notes (Signed)
CSN: [id number]     Arrival date & time 10/16/14  0845 History   First MD Initiated Contact with Patient 10/16/14 [phone]     Chief Complaint  Patient presents with  . Shortness of Breath     (Consider location/radiation/quality/duration/timing/severity/associated sxs/prior Treatment) Patient is a 81 y.o. female presenting with shortness of breath. The history is provided by the patient.  Shortness of Breath Severity:  Mild Onset quality:  Sudden Timing:  Intermittent Progression:  Unchanged Chronicity:  Recurrent Context: activity (with walking)   Relieved by:  Rest Worsened by:  Exertion Associated symptoms: no abdominal pain, no cough, no fever, no rash and no vomiting     Past Medical History  Diagnosis Date  . Anemia   . Anxiety   . Arthritis   . GERD (gastroesophageal reflux disease) 2009    EGD with benign gastric polyp too  . Hyperlipidemia   . Hypertension   . Vitamin D deficiency   . Colon polyp     2009 colonoscopy, not retrieved for pathology  . Hiatal hernia   . Diverticulosis 2009  . Aortic insufficiency     mild  . Stroke 1975  . CKD (chronic kidney disease) stage 3, GFR 30-59 ml/min   . CHF (congestive heart failure)   . CAD (coronary artery disease)     cath 08/09/2014 95% stenosis in prox to mid RCA s/p DES, 80-90% prox OM2, 50% distal LAD   Past Surgical History  Procedure Laterality Date  . Appendectomy    . Abdominal hysterectomy    . Artery biopsy Left 12/16/2012    Procedure: BIOPSY TEMPORAL ARTERY;  Surgeon: Mal Misty, MD;  Location: [place];  Service: Vascular;  Laterality: Left;  . Left heart catheterization with coronary angiogram N/A 08/09/2014    Procedure: LEFT HEART CATHETERIZATION WITH CORONARY ANGIOGRAM;  Surgeon: Peter M Martinique, MD;  Location: [facility] CATH LAB;  Service: Cardiovascular;  Laterality: N/A;  . Percutaneous coronary stent intervention (pci-s)  08/09/2014    Procedure: PERCUTANEOUS CORONARY STENT INTERVENTION (PCI-S);  Surgeon:  Peter M Martinique, MD;  Location: [facility] CATH LAB;  Service: Cardiovascular;;   Family History  Problem Relation Age of Onset  . Stroke Mother   . Hypertension Mother   . Stroke Father   . Hypertension Father   . Diabetes Sister    History  Substance Use Topics  . Smoking status: Former Smoker    Types: Cigarettes    Quit date: 07/02/1979  . Smokeless tobacco: Former Systems developer  . Alcohol Use: No   OB History    No data available     Review of Systems  Constitutional: Negative for fever.  Respiratory: Negative for cough and shortness of breath.   Gastrointestinal: Negative for vomiting and abdominal pain.  Skin: Negative for rash.  All other systems reviewed and are negative.     Allergies  Review of patient's allergies indicates no known allergies.  Home Medications   Prior to Admission medications   Medication Sig Start Date End Date Taking? Authorizing Provider  acetaminophen (TYLENOL) 325 MG tablet Take 650 mg by mouth every 6 (six) hours as needed for mild pain.   Yes Historical Provider, MD  albuterol (PROVENTIL HFA;VENTOLIN HFA) 108 (90 BASE) MCG/ACT inhaler Inhale 2 puffs into the lungs every 4 (four) hours as needed for wheezing or shortness of breath. 08/05/14  Yes Luan Moore, MD  aspirin EC 81 MG tablet Take 81 mg by mouth daily.   Yes Historical Provider,  MD  atorvastatin (LIPITOR) 20 MG tablet Take 1 tablet (20 mg total) by mouth daily at 6 PM. 08/10/14  Yes Francesca Oman, DO  bisoprolol (ZEBETA) 5 MG tablet Take 1 tablet (5 mg total) by mouth daily. 08/10/14  Yes Francesca Oman, DO  ferrous sulfate 325 (65 FE) MG tablet take 1 tablet by mouth twice a day 05/10/14  Yes Luan Moore, MD  hydroxypropyl methylcellulose / hypromellose (ISOPTO TEARS / GONIOVISC) 2.5 % ophthalmic solution Place 1 drop into the left eye 4 (four) times daily as needed for dry eyes. 04/12/14  Yes Noland Fordyce, PA-C  isosorbide mononitrate (IMDUR) 30 MG 24 hr tablet Take 1 tablet (30 mg total) by  mouth daily. 08/10/14  Yes Francesca Oman, DO  omeprazole (PRILOSEC) 20 MG capsule Take 1 capsule (20 mg total) by mouth 2 (two) times daily before a meal. 04/12/14  Yes Noland Fordyce, PA-C  potassium chloride SA (K-DUR,KLOR-CON) 20 MEQ tablet take 1 tablet by mouth once daily 08/03/14  Yes Luan Moore, MD  ticagrelor (BRILINTA) 90 MG TABS tablet Take 1 tablet (90 mg total) by mouth 2 (two) times daily. 09/29/14  Yes Alex Ronnie Derby, DO   BP 136/55 mmHg  Pulse 62  Temp(Src) 98.3 F (36.8 C) (Oral)  Resp 18  Ht 5\' 5"  (1.651 m)  Wt 160 lb 1.6 oz (72.621 kg)  BMI 26.64 kg/m2  SpO2 99% Physical Exam  Constitutional: She is oriented to person, place, and time. She appears well-developed and well-nourished. No distress.  HENT:  Head: Normocephalic and atraumatic.  Mouth/Throat: Oropharynx is clear and moist.  Eyes: EOM are normal. Pupils are equal, round, and reactive to light.  Neck: Normal range of motion. Neck supple. No JVD present.  Cardiovascular: Normal rate and regular rhythm.  Exam reveals no friction rub.   Murmur heard.  Systolic murmur is present with a grade of 2/6  Pulmonary/Chest: Effort normal and breath sounds normal. No respiratory distress. She has no wheezes. She has no rales.  Abdominal: Soft. She exhibits no distension. There is no tenderness. There is no rebound.  Musculoskeletal: Normal range of motion. She exhibits no edema or tenderness.  Neurological: She is alert and oriented to person, place, and time.  Skin: She is not diaphoretic.  Nursing note and vitals reviewed.   ED Course  Procedures (including critical care time) Labs Review Labs Reviewed  CBC - Abnormal; Notable for the following:    RBC 2.82 (*)    Hemoglobin 8.9 (*)    HCT 26.5 (*)    Platelets 126 (*)    All other components within normal limits  BASIC METABOLIC PANEL - Abnormal; Notable for the following:    GFR calc non Af Amer 47 (*)    GFR calc Af Amer 54 (*)    All other components within  normal limits  BRAIN NATRIURETIC PEPTIDE - Abnormal; Notable for the following:    B Natriuretic Peptide 401.2 (*)    All other components within normal limits  VITAMIN B12 - Abnormal; Notable for the following:    Vitamin B-12 181 (*)    All other components within normal limits  IRON AND TIBC - Abnormal; Notable for the following:    TIBC 221 (*)    All other components within normal limits  FERRITIN - Abnormal; Notable for the following:    Ferritin 366 (*)    All other components within normal limits  RETICULOCYTES - Abnormal; Notable for the following:  RBC. 2.99 (*)    All other components within normal limits  MRSA PCR SCREENING  FOLATE  PROTIME-INR  TECHNOLOGIST SMEAR REVIEW  MAGNESIUM  TROPONIN I  TROPONIN I  Randolm Idol, ED    Imaging Review Dg Chest 2 View  10/16/2014   CLINICAL DATA:  81 year old female with a history of chronic shortness of breath. History of smoking, hypertension, congestive heart failure.  EXAM: CHEST - 2 VIEW  COMPARISON:  10/14/2014, 08/07/2014, chest CT 08/06/2014  FINDINGS: Cardiomediastinal silhouette unchanged in size and contour with cardiomegaly.  Atherosclerosis of the aortic arch.  Fullness of the central vasculature, similar to the comparison.  Stigmata of emphysema, with increased retrosternal airspace, flattened hemidiaphragms, increased AP diameter, and hyperinflation on the AP view.  No confluent airspace disease. Chronic interstitial prominence. No pneumothorax. No pleural effusion.  No displaced fracture. Degenerative changes of the bilateral shoulders.  Unremarkable appearance of the upper abdomen.  IMPRESSION: Chronic interstitial opacities, potentially reflecting prior episodes of CHF. Mild interstitial edema not excluded. No pleural effusion or lobar pneumonia.  Cardiomegaly and atherosclerosis.  Changes of emphysema.  Signed,  Dulcy Fanny. Earleen Newport, DO  [facility]  [facility]    Electronically Signed   By: Corrie Mckusick D.O.   On: 10/16/2014 10:03     EKG Interpretation   Date/Time:  Sunday October 16 2014 08:51:07 EDT Ventricular Rate:  78 PR Interval:  143 QRS Duration: 85 QT Interval:  384 QTC Calculation: 437 R Axis:   25 Text Interpretation:  Sinus rhythm Ventricular premature complex Low  voltage, precordial leads Abnormal R-wave progression, early transition No  significant change since last tracing Confirmed by Mingo Amber  MD, Stella  ([id number]) on 10/16/2014 8:59:01 AM      MDM   Final diagnoses:  Shortness of breath  CHF exacerbation  81 year old female here for shortness of breath. Began all she was walking down the hall today. No chest pain, cough, fever. Seen here 2 days ago for similar, had normal workup at that time with BNP of 288, hemoglobin 8.9 with normal rectal exam without Hemoccult positivity. Here vitals are stable. No hypoxia. No JVD, peripheral edema, Rales. EKG similar to prior. He is very well-appearing is not short of breath. Shows an apartment building does not have regular care or scheduled outside help. We'll check labs in today and ambulate her with a pulse ox.  Labs with mild increase in BNP up to 401. Patient is very tachypnea when ambulating. Will admit for CHF exacerbation.  Evelina Bucy, MD 10/17/14 [phone]

## 2014-10-16 NOTE — ED Notes (Signed)
Pt presents to department via [facility] for evaluation of SOB. Was seen recently in ED for same. Reports increased shortness of breath x1 week. Denies pain. No relief with inhaler at home. Pt reports dry cough. Respirations unlabored upon arrival to ED. Pt is alert and oriented x4.

## 2014-10-16 NOTE — ED Notes (Signed)
Pt up to ambulate with assistance. Reports SOB and burning to middle of chest. 100% room air. Pt escorted back to bed.

## 2014-10-17 ENCOUNTER — Observation Stay (HOSPITAL_COMMUNITY): Payer: Medicare Other

## 2014-10-17 DIAGNOSIS — Z7982 Long term (current) use of aspirin: Secondary | ICD-10-CM | POA: Diagnosis not present

## 2014-10-17 DIAGNOSIS — I2510 Atherosclerotic heart disease of native coronary artery without angina pectoris: Secondary | ICD-10-CM

## 2014-10-17 DIAGNOSIS — J449 Chronic obstructive pulmonary disease, unspecified: Secondary | ICD-10-CM

## 2014-10-17 DIAGNOSIS — I509 Heart failure, unspecified: Secondary | ICD-10-CM | POA: Diagnosis not present

## 2014-10-17 DIAGNOSIS — R0602 Shortness of breath: Secondary | ICD-10-CM | POA: Diagnosis not present

## 2014-10-17 DIAGNOSIS — D649 Anemia, unspecified: Secondary | ICD-10-CM

## 2014-10-17 DIAGNOSIS — E785 Hyperlipidemia, unspecified: Secondary | ICD-10-CM

## 2014-10-17 DIAGNOSIS — N183 Chronic kidney disease, stage 3 (moderate): Secondary | ICD-10-CM

## 2014-10-17 DIAGNOSIS — Z79899 Other long term (current) drug therapy: Secondary | ICD-10-CM

## 2014-10-17 DIAGNOSIS — H02105 Unspecified ectropion of left lower eyelid: Secondary | ICD-10-CM

## 2014-10-17 DIAGNOSIS — I252 Old myocardial infarction: Secondary | ICD-10-CM | POA: Diagnosis not present

## 2014-10-17 DIAGNOSIS — Z87891 Personal history of nicotine dependence: Secondary | ICD-10-CM

## 2014-10-17 DIAGNOSIS — I129 Hypertensive chronic kidney disease with stage 1 through stage 4 chronic kidney disease, or unspecified chronic kidney disease: Secondary | ICD-10-CM

## 2014-10-17 DIAGNOSIS — K219 Gastro-esophageal reflux disease without esophagitis: Secondary | ICD-10-CM

## 2014-10-17 LAB — BASIC METABOLIC PANEL
ANION GAP: 10 (ref 5–15)
BUN: 23 mg/dL (ref 6–23)
CALCIUM: 9.3 mg/dL (ref 8.4–10.5)
CO2: 20 mmol/L (ref 19–32)
CREATININE: 1.31 mg/dL — AB (ref 0.50–1.10)
Chloride: 110 mmol/L (ref 96–112)
GFR calc Af Amer: 42 mL/min — ABNORMAL LOW (ref >90)
GFR, EST NON AFRICAN AMERICAN: 37 mL/min — AB (ref >90)
Glucose, Bld: 96 mg/dL (ref 70–99)
Potassium: 4.2 mmol/L (ref 3.5–5.1)
Sodium: 140 mmol/L (ref 135–145)

## 2014-10-17 LAB — TROPONIN I: Troponin I: <0.03 ng/mL (ref <0.031)

## 2014-10-17 MED ORDER — FUROSEMIDE 40 MG PO TABS: 40.0000 mg | ORAL_TABLET | Freq: Every day | ORAL | Status: DC

## 2014-10-17 MED ORDER — VITAMIN B-12 1000 MCG PO TABS
1000.0000 ug | ORAL_TABLET | Freq: Every day | ORAL | Status: DC
  Administered 2014-10-17: 1000 ug via ORAL
  Filled 2014-10-17: qty 1

## 2014-10-17 MED ORDER — CYANOCOBALAMIN 1000 MCG PO TABS: 1000.0000 ug | ORAL_TABLET | Freq: Every day | ORAL | Status: DC

## 2014-10-17 MED ORDER — FUROSEMIDE 40 MG PO TABS
40.0000 mg | ORAL_TABLET | Freq: Every day | ORAL | Status: DC
  Administered 2014-10-17: 40 mg via ORAL
  Filled 2014-10-17: qty 1

## 2014-10-17 MED ORDER — BISOPROLOL FUMARATE 5 MG PO TABS: 2.5000 mg | ORAL_TABLET | Freq: Every day | ORAL | Status: DC

## 2014-10-17 NOTE — Progress Notes (Signed)
Patient ambulated in hallway.  Sats stayed between 95-97% on room air.  No oxygen needed.

## 2014-10-17 NOTE — Progress Notes (Signed)
RN called to bedside for complaints of SOB.  Patient O2 sat at this point 99% on RA, RR @ 19.  Patient requested to have oxygen for comfort.  O2 regulated at 2lpm via [place].  Patient verbalized comfort. Will continue to monitor and evaluate.

## 2014-10-17 NOTE — Progress Notes (Signed)
Subjective: Doing better, has some subjective SOB but satting well on room air. Afebrile, no n/v/cp, sob.   Objective: Vital signs in last 24 hours: Filed Vitals:   10/16/14 1251 10/16/14 2127 10/17/14 0307 10/17/14 0603  BP: 147/68 115/60  136/55  Pulse: 68 62  62  Temp: 98.3 F (36.8 C) 98.3 F (36.8 C)  98.3 F (36.8 C)  TempSrc: Oral Oral  Oral  Resp: 18 16  18   Height: 5\' 5"  (1.651 m)     Weight: 159 lb 9.6 oz (72.394 kg)  160 lb 1.6 oz (72.621 kg)   SpO2: 100% 97%  99%   Weight change:   Intake/Output Summary (Last 24 hours) at 10/17/14 1244 Last data filed at 10/17/14 0851  Gross per 24 hour  Intake    490 ml  Output    301 ml  Net    189 ml   Vitals reviewed. General: resting in bed, NAD HEENT: PERRL, EOMI, no scleral icterus Cardiac: RRR, no rubs, murmurs or gallops Pulm: clear to auscultation bilaterally, no wheezes, rales, or rhonchi Abd: soft, nontender, nondistended, BS present Ext: warm and well perfused, no pedal edema Neuro: alert and oriented X3, cranial nerves II-XII grossly intact, strength and sensation to light touch equal in bilateral upper and lower extremities  Lab Results: Basic Metabolic Panel:  Recent Labs Lab 10/16/14 0942 10/16/14 1159 10/17/14 0826  NA 140  --  140  K 3.8  --  4.2  CL 111  --  110  CO2 19  --  20  GLUCOSE 91  --  96  BUN 17  --  23  CREATININE 1.07  --  1.31*  CALCIUM 8.9  --  9.3  MG  --  1.8  --    Liver Function Tests:  Recent Labs Lab 10/14/14 0450  AST 20  ALT 16  ALKPHOS 81  BILITOT 0.4  PROT 6.7  ALBUMIN 3.6   No results for input(s): LIPASE, AMYLASE in the last 168 hours. No results for input(s): AMMONIA in the last 168 hours. CBC:  Recent Labs Lab 10/14/14 0450 10/16/14 0942  WBC 4.5 4.0  HGB 8.9* 8.9*  HCT 26.4* 26.5*  MCV 93.6 94.0  PLT 136* 126*   Cardiac Enzymes:  Recent Labs Lab 10/14/14 0450 10/16/14 2200 10/17/14 0407  TROPONINI <0.03 <0.03 <0.03   Recent  Labs Lab 10/14/14 0450 10/16/14 1159  LABPROT 14.1 14.5  INR 1.07 1.12   Anemia Panel:  Recent Labs Lab 10/16/14 1159  VITAMINB12 181*  FOLATE 18.2  FERRITIN 366*  TIBC 221*  IRON 75  RETICCTPCT 1.7  Misc. Labs:   Micro Results: Recent Results (from the past 240 hour(s))  MRSA PCR Screening     Status: None   Collection Time: 10/16/14 11:26 AM  Result Value Ref Range Status   MRSA by PCR NEGATIVE NEGATIVE Final    Comment:        The GeneXpert MRSA Assay (FDA approved for NASAL specimens only), is one component of a comprehensive MRSA colonization surveillance program. It is not intended to diagnose MRSA infection nor to guide or monitor treatment for MRSA infections.    Studies/Results: Dg Chest 2 View  10/17/2014   CLINICAL DATA:  Shortness of Breath  EXAM: CHEST  2 VIEW  COMPARISON:  10/16/2014  FINDINGS: Cardiac shadow is stable. Mild interstitial changes are again seen without focal infiltrate or sizable effusion. No acute bony abnormality is noted.  IMPRESSION: No acute  abnormality noted.   Electronically Signed   By: Inez Catalina M.D.   On: 10/17/2014 07:55   Dg Chest 2 View  10/16/2014   CLINICAL DATA:  81 year old female with a history of chronic shortness of breath. History of smoking, hypertension, congestive heart failure.  EXAM: CHEST - 2 VIEW  COMPARISON:  10/14/2014, 08/07/2014, chest CT 08/06/2014  FINDINGS: Cardiomediastinal silhouette unchanged in size and contour with cardiomegaly.  Atherosclerosis of the aortic arch.  Fullness of the central vasculature, similar to the comparison.  Stigmata of emphysema, with increased retrosternal airspace, flattened hemidiaphragms, increased AP diameter, and hyperinflation on the AP view.  No confluent airspace disease. Chronic interstitial prominence. No pneumothorax. No pleural effusion.  No displaced fracture. Degenerative changes of the bilateral shoulders.  Unremarkable appearance of the upper abdomen.   IMPRESSION: Chronic interstitial opacities, potentially reflecting prior episodes of CHF. Mild interstitial edema not excluded. No pleural effusion or lobar pneumonia.  Cardiomegaly and atherosclerosis.  Changes of emphysema.  Signed,  Dulcy Fanny. Earleen Newport, DO  [facility]  [facility]   Electronically Signed   By: Corrie Mckusick D.O.   On: 10/16/2014 10:03   Medications: I have reviewed the patient's current medications. Scheduled Meds: . aspirin EC  81 mg Oral Daily  . atorvastatin  20 mg Oral q1800  . bisoprolol  5 mg Oral Daily  . enoxaparin (LOVENOX) injection  40 mg Subcutaneous Q24H  . ferrous sulfate  325 mg Oral BID  . furosemide  40 mg Oral Daily  . isosorbide mononitrate  30 mg Oral Daily  . pantoprazole  40 mg Oral Daily  . sodium chloride  3 mL Intravenous Q12H  . ticagrelor  90 mg Oral BID  . vitamin B-12  1,000 mcg Oral Daily   Continuous Infusions:  PRN Meds:.acetaminophen, hydroxypropyl methylcellulose / hypromellose Assessment/Plan: Active Problems:   Vitamin B12 deficiency   Vitamin D deficiency   Anemia, chronic disease   Osteoarthritis   Ectropion of left lower eyelid   Dyspnea   COPD (chronic obstructive pulmonary disease)   CHF exacerbation   Prediabetes  81 yo female with CAD, CHF, COPD here with SOB likely 2/2 to CHF exc.   CHF exacerbation previous ECHO EF 60% 05/2014, by cath 55-65%, - sob worse with lying flat, has been off lasix, no fever/chills, mild crackles b/l. BNP elevated 400's. CXR showing chronic interstitial edema and cardiomegaly, likely 2/2 to not being on lasix. Lasix and losartan were held due to AKI. BNP 288 2 days ago, now 401.  - received lasix 40mg  IV once and felt better. satting fine now on room air. CXR shows chronic CHF, looks somewhat better today. - trops negative. No chest pain. No ekg changes. - will resume lasix 40mg  daily at home. Mild AKI should be tolerated with this patient since  she gets symptomatic without lasix.  CAD - nstemi 08/2014. Sever 2 vessel obstructive CAD. cath 08/09/2014 95% stenosis in prox to mid RCA s/p DES, 80-90% prox OM2, 50% distal LAD.- - no cp, no ekg changes. trops negative.  - on ASA and brilinta for one year. Cont both.  - cont lipitor 20mg  daily - cont cardiac rehab outpatient.   HTN - cont imdur + bisoprolol - hold losartan for now. May consider restarting as aki resolved. - will reduce dose to 2.5 mg daily from 5 mg) as her heart rate is low in 50's.   CKD stage 3 - had recent aki but now  resolved after holding lasix outpatient. - had AKI and lasix and losartan were held. Resolved. Now monitor with lasix.  Chronic anemia - normocytic. previous anemia panel was normal for iron. Hx of vitamin B12 def -repeated anemia panel. Has vitamin b12 deficiency. Will put her back on vitamin B12. Needs follow up in 1 month or so. - already on po iron, continue this.  Vitamin B12 deficiency - started vit b 12. Checking intrinsic factor. Recheck vit b12 in 1 month.  Copd - stable, no wheezing, sob likely from chf rather than copd.  - Cont home albuterol.  HLD - cont lipitor 20mg  daily (not sure why not 40mg  daily?). Will leave upto PCP.  GERD - cont ppi. Ectropion of left lid -hydroxypropyl methylcellulose / hypromellose Eye drops.   Dispo: Disposition is deferred at this time, awaiting improvement of current medical problems.  Anticipated discharge in approximately 1-2 day(s).   The patient does have a current PCP Luan Moore, MD) and does need an [facility] hospital follow-up appointment after discharge.  The patient does have transportation limitations that hinder transportation to clinic appointments.  .Services Needed at time of discharge: Y = Yes, Blank = No PT:   OT:   RN:   Equipment:   Other:       Dellia Nims, MD 10/17/2014, 12:44 PM

## 2014-10-17 NOTE — Progress Notes (Signed)
UR completed 

## 2014-10-17 NOTE — Progress Notes (Signed)
Patient ID: Jane [name], female   DOB: 05-23-1929, 81 y.o.   MRN: [id number] Medicine attending admission note: I personally interviewed and examined this patient and reviewed pertinent clinical, laboratory, and radiographic data and I attest to the accuracy of the admission evaluation as recorded by resident physician Dr. Dellia Nims except for any changes that might be outlined below.  Clinical summary: 81 year old woman with known coronary artery disease status post recent non-ST elevation MI in February 2016. She underwent cardiac catheterization at that time which showed severe 3-vessel coronary disease with a 50% stenosis in the distal LAD, occlusion of the obtuse marginal branch of the left circumflex and a 80-90% stenosis in the second branch, and a 95% stenosis in the proximal to mid right coronary artery. She  required angioplasty and drug eluting stent of the right coronary and is now on dual antiplatelet agents. Estimated ejection fraction at time of cardiac catheterization done on February 9 was 55-65 percent.  She presents at the time of the current admission with increasing dyspnea with minimal exertion, paroxysmal nocturnal dyspnea and orthopnea occurring over the last 3-4 days. She had initial emergency room visits on April 11, and again on April 15 for similar symptoms. Initial exam and evaluations unremarkable. At time of the current evaluation there appears to be a trend for elevation of her BNP which was 67 in January, 148 in February, 311 on April 11, and 401 on April 17. Chest x-ray shows cardiomegaly which is chronic, no gross increase in interstitial markings, changes of obstructive airway disease which are also chronic.  Initial exam by Dr. Genene Churn: Blood pressure 147/68, pulse 68, temperature 98.3 F (36.8 C), temperature source Oral, resp. rate 18, height 5\' 5"  (1.651 m), weight 159 lb 9.6 oz (72.394 kg), SpO2 100 %. Patient received 40 mg of IV Lasix in the emergency  department and at time of initial exam was in no respiratory distress. Mild diffuse rales throughout both lung fields.  Current exam: Blood pressure 136/55, pulse 62, temperature 98.3 F (36.8 C), temperature source Oral, resp. rate 18, height 5\' 5"  (1.651 m), weight 160 lb 1.6 oz (72.621 kg), SpO2 99 %.  Lungs are currently clear to auscultation and resonant to percussion throughout. Regular cardiac rhythm without murmur gallop or rub. No jugular venous distention. Extremities no edema no calf tenderness.  Chest x-ray: See description above  EKG: Sinus rhythm, no acute ischemic change, occasional PVC, Q-wave in V1 my reading with overall low voltage and poor R-wave progression in the precordial leads. No acute change from previous tracing.  Pertinent lab:  Troponin: Undetectable 3 BNP: 401 elevated compared with prior values Creatinine 1.1, repeat 1.3: Previous values following aggressive diuresis on 09/29/2014: 57 and 1.7. Diuretics on hold for the last 2 weeks Hemoglobin 8.9, MCV 94 with recent baseline hemoglobin 9.05 August 2014,  Impression: #1. Exacerbation of congestive heart failure and a woman with known coronary artery disease status post recent non-ST elevation MI and right coronary stent. No evidence for further acute myocardial damage at this time. Possibility that symptoms worsened since diuretics were placed on hold for the last 2 weeks after aggressive diuresis with rising BUN and creatinine. We will resume her diuretics.  #2. Acute on chronic normochromic anemia Anemia may be contributing to her respiratory symptoms. B12 level running low and we will begin oral replacement. Lab will be obtained for a myeloma screen.

## 2014-10-17 NOTE — Progress Notes (Signed)
Patient discharged home. Discharge instructions completed. Med list reviewed with patient.

## 2014-10-17 NOTE — Discharge Summary (Signed)
Name: Jane [name] MRN: [id number] DOB: Nov 04, 1928 81 y.o. PCP: Luan Moore, MD  Date of Admission: 10/16/2014  8:45 AM Date of Discharge: 10/19/2014 Attending Physician: No att. providers found  Discharge Diagnosis:  Active Problems:   Vitamin B12 deficiency   Vitamin D deficiency   Anemia, chronic disease   Osteoarthritis   Ectropion of left lower eyelid   Dyspnea   COPD (chronic obstructive pulmonary disease)   CHF exacerbation   Prediabetes   Normochromic normocytic anemia  Discharge Medications:   Medication List    TAKE these medications        acetaminophen 325 MG tablet  Commonly known as:  TYLENOL  Take 650 mg by mouth every 6 (six) hours as needed for mild pain.     albuterol 108 (90 BASE) MCG/ACT inhaler  Commonly known as:  PROVENTIL HFA;VENTOLIN HFA  Inhale 2 puffs into the lungs every 4 (four) hours as needed for wheezing or shortness of breath.     aspirin EC 81 MG tablet  Take 81 mg by mouth daily.     atorvastatin 20 MG tablet  Commonly known as:  LIPITOR  Take 1 tablet (20 mg total) by mouth daily at 6 PM.     bisoprolol 5 MG tablet  Commonly known as:  ZEBETA  Take 0.5 tablets (2.5 mg total) by mouth daily.     cyanocobalamin 1000 MCG tablet  Take 1 tablet (1,000 mcg total) by mouth daily.     ferrous sulfate 325 (65 FE) MG tablet  take 1 tablet by mouth twice a day     furosemide 40 MG tablet  Commonly known as:  LASIX  Take 1 tablet (40 mg total) by mouth daily.     hydroxypropyl methylcellulose / hypromellose 2.5 % ophthalmic solution  Commonly known as:  ISOPTO TEARS / GONIOVISC  Place 1 drop into the left eye 4 (four) times daily as needed for dry eyes.     isosorbide mononitrate 30 MG 24 hr tablet  Commonly known as:  IMDUR  Take 1 tablet (30 mg total) by mouth daily.     omeprazole 20 MG capsule  Commonly known as:  PRILOSEC  Take 1 capsule (20 mg total) by mouth 2 (two) times daily before a meal.     potassium  chloride SA 20 MEQ tablet  Commonly known as:  K-DUR,KLOR-CON  take 1 tablet by mouth once daily     ticagrelor 90 MG Tabs tablet  Commonly known as:  BRILINTA  Take 1 tablet (90 mg total) by mouth 2 (two) times daily.        Disposition and follow-up:   [name] was discharged from [facility] in Stable condition.  At the hospital follow up visit please address:  1.  Came in with CHF exec likely 2/2 to being off lasix recently as she has some crt elevation. I think for her, we should tolerate mild crt elevation with lasix as long as it helps with her CHF. Sending her with 40mg  daily lasix PO.  Check BMET. Consider restarting losartan outpatient for cardiomyopathy benefit.   Has low B12, started B12. Recheck vitamin B12 in 1 month.   2.  Labs / imaging needed at time of follow-up: BMET, b12 in 1 month.  3.  Pending labs/ test needing follow-up: intrinsic factor.   Follow-up Appointments:     Follow-up Information    Follow up with Cresenciano Genre, MD On 10/21/2014.  Specialty:  Internal Medicine   Why:  3:45 pm   Contact information:   [place] [address] [phone]       Discharge Instructions: Discharge Instructions    Diet - low sodium heart healthy    Complete by:  As directed            Consultations:    Procedures Performed:  Dg Chest 2 View  10/17/2014   CLINICAL DATA:  Shortness of Breath  EXAM: CHEST  2 VIEW  COMPARISON:  10/16/2014  FINDINGS: Cardiac shadow is stable. Mild interstitial changes are again seen without focal infiltrate or sizable effusion. No acute bony abnormality is noted.  IMPRESSION: No acute abnormality noted.   Electronically Signed   By: Inez Catalina M.D.   On: 10/17/2014 07:55   Dg Chest 2 View  10/16/2014   CLINICAL DATA:  81 year old female with a history of chronic shortness of breath. History of smoking, hypertension, congestive heart failure.  EXAM: CHEST - 2 VIEW  COMPARISON:   10/14/2014, 08/07/2014, chest CT 08/06/2014  FINDINGS: Cardiomediastinal silhouette unchanged in size and contour with cardiomegaly.  Atherosclerosis of the aortic arch.  Fullness of the central vasculature, similar to the comparison.  Stigmata of emphysema, with increased retrosternal airspace, flattened hemidiaphragms, increased AP diameter, and hyperinflation on the AP view.  No confluent airspace disease. Chronic interstitial prominence. No pneumothorax. No pleural effusion.  No displaced fracture. Degenerative changes of the bilateral shoulders.  Unremarkable appearance of the upper abdomen.  IMPRESSION: Chronic interstitial opacities, potentially reflecting prior episodes of CHF. Mild interstitial edema not excluded. No pleural effusion or lobar pneumonia.  Cardiomegaly and atherosclerosis.  Changes of emphysema.  Signed,  Dulcy Fanny. Earleen Newport, DO  [facility]  [facility]   Electronically Signed   By: Corrie Mckusick D.O.   On: 10/16/2014 10:03   Dg Chest 2 View (if Patient Has Fever And/or Copd)  10/14/2014   CLINICAL DATA:  Shortness of breath  EXAM: CHEST  2 VIEW  COMPARISON:  10/10/2014  FINDINGS: There is chronic cardiomegaly and aortic tortuosity. Chronic bronchitic changes with hyperinflation. Apparent increased density over the spine laterally is likely related to rotation and the paravertebral location of the aorta. No pneumonia is suspected when correlated with the frontal projection. No edema, effusion, or pneumothorax.  IMPRESSION: Cardiomegaly and COPD.  No acute superimposed findings.   Electronically Signed   By: Monte Fantasia M.D.   On: 10/14/2014 05:35   Dg Chest 2 View  10/10/2014   CLINICAL DATA:  Shortness of breath for 2 days  EXAM: CHEST  2 VIEW  COMPARISON:  08/07/2014  FINDINGS: Cardiac shadow is at the upper limits of normal in size. The lungs are well aerated bilaterally. No focal infiltrate or sizable effusion is seen. No acute bony  abnormality is noted.  IMPRESSION: No acute abnormality seen.   Electronically Signed   By: Inez Catalina M.D.   On: 10/10/2014 10:18    Admission HPI:   81 yo female hx of CKD stage 3, CHF, CAD Comes in with sob, worse with exertion, worse lying flat, denies fever/chills/cough. This has been going on for few days, went to the ED few times. Today it was worse, started after walking short distance. She feels swollen around her upper body. Gets SOB with mild exertion and also sometimes with lying flat.   No chest pain, cough, fever, chills, n/v, bleeding. She was told to stop lasix and  losartan for AKi recently, was seen by Dr. Redmond Pulling in clinic on 10/10/14. Was euvolemic that time. She went to the ED on 4/11 and also 4/15 with same complaint. BNP was 288 on 10/14/14, was sent home, didn't seem to be in CHF exc that time to EDP. CXR shows chronic interstitial opacity, likely prior CHF. Today her BNP is 401. ED gave her 40mg  IV lasix with good response.   Hospital Course by problem list: Active Problems:   Vitamin B12 deficiency   Vitamin D deficiency   Anemia, chronic disease   Osteoarthritis   Ectropion of left lower eyelid   Dyspnea   COPD (chronic obstructive pulmonary disease)   CHF exacerbation   Prediabetes   Normochromic normocytic anemia   81 yo female with CAD, CHF, COPD here with SOB likely 2/2 to CHF exc.   CHF exacerbation -2/2 to being off lasix. previous ECHO EF 60% 05/2014, by cath 55-65%, - sob worse with lying flat, has been off lasix, no fever/chills, mild crackles b/l. BNP elevated 400's. CXR showing chronic interstitial edema and cardiomegaly, likely 2/2 to not being on lasix. Lasix and losartan were held due to AKI. BNP 288 2 days ago, now 401.  Anemia is also playing a role in her dyspnea  - received lasix 40mg  IV once and felt better. satting fine now on room air. CXR shows chronic CHF, looks somewhat better today after diuresis.  - trops negative. No chest pain. No  ekg changes. - will resume lasix 40mg  daily at home. Mild AKI should be tolerated with this patient since she gets symptomatic without lasix.  CAD - nstemi 08/2014. Sever 2 vessel obstructive CAD. cath 08/09/2014 95% stenosis in prox to mid RCA s/p DES, 80-90% prox OM2, 50% distal LAD.- - no cp, no ekg changes. trops negative.  - on ASA and brilinta for one year. Cont both.  - cont lipitor 20mg  daily. Consider going up to 40mg  outpatient at follow up. - cont cardiac rehab outpatient.   HTN - cont imdur + bisoprolol - hold losartan for now. May consider restarting as aki resolved. - will reduce dose to 2.5 mg daily from 5 mg) as her heart rate is low in 50's.   CKD stage 3 - had recent aki but now resolved after holding lasix outpatient. - had AKI and lasix and losartan were held. Resolved. Now monitor with lasix.  Chronic anemia - normocytic. previous anemia panel was normal for iron. Hx of vitamin B12 def -repeated anemia panel. Has vitamin b12 deficiency. Will put her back on vitamin B12. Needs follow up in 1 month or so. - already on po iron, continue this.  Vitamin B12 deficiency - started vit b 12. Checking intrinsic factor. Recheck vit b12 in 1 month.  Copd - stable, no wheezing, sob likely from chf rather than copd.  - Cont home albuterol.  HLD - cont lipitor 20mg  daily (not sure why not 40mg  daily?). Will leave upto PCP.  GERD - cont ppi. Ectropion of left lid -hydroxypropyl methylcellulose / hypromellose Eye drops.   Discharge Vitals:   BP 136/55 mmHg  Pulse 62  Temp(Src) 98.3 F (36.8 C) (Oral)  Resp 18  Ht 5\' 5"  (1.651 m)  Wt 160 lb 1.6 oz (72.621 kg)  BMI 26.64 kg/m2  SpO2 99%  Discharge Labs:  No results found for this or any previous visit (from the past 24 hour(s)).  BMP Latest Ref Rng 10/17/2014 10/16/2014 10/14/2014  Glucose 70 - 99 mg/dL  96 91 87  BUN 6 - 23 mg/dL 23 17 23   Creatinine 0.50 - 1.10 mg/dL 1.31(H) 1.07 1.22(H)  Sodium 135 - 145 mmol/L 140  140 141  Potassium 3.5 - 5.1 mmol/L 4.2 3.8 4.3  Chloride 96 - 112 mmol/L 110 111 114(H)  CO2 19 - 32 mmol/L 20 19 16(L)  Calcium 8.4 - 10.5 mg/dL 9.3 8.9 9.1   CBC Latest Ref Rng 10/16/2014 10/14/2014 10/10/2014  WBC 4.0 - 10.5 K/uL 4.0 4.5 4.6  Hemoglobin 12.0 - 15.0 g/dL 8.9(L) 8.9(L) 9.7(L)  Hematocrit 36.0 - 46.0 % 26.5(L) 26.4(L) 29.4(L)  Platelets 150 - 400 K/uL 126(L) 136(L) 142(L)     Signed: Dellia Nims, MD 10/19/2014, 11:25 AM    Services Ordered on Discharge:  Equipment Ordered on Discharge:

## 2014-10-17 NOTE — Care Management Note (Unsigned)
    Page 1 of 1   10/17/2014     3:55:39 PM CARE MANAGEMENT NOTE 10/17/2014  Patient:  Jane [name], Jane [name]   Account Number:  [id number]  Date Initiated:  10/17/2014  Documentation initiated by:  Rahel Carlton  Subjective/Objective Assessment:   Pt adm on 10/16/14 with CHF, SOB, CP.  PTA, pt resides at home alone and is independent.     Action/Plan:   Will follow for home needs as pt progresses.   Anticipated DC Date:  10/18/2014   Anticipated DC Plan:  [place]  CM consult      Choice offered to / List presented to:             Status of service:  In process, will continue to follow Medicare Important Message given?   (If response is "NO", the following Medicare IM given date fields will be blank) Date Medicare IM given:   Medicare IM given by:   Date Additional Medicare IM given:   Additional Medicare IM given by:    Discharge Disposition:    Per UR Regulation:  Reviewed for med. necessity/level of care/duration of stay  If discussed at [place] of Stay Meetings, dates discussed:    Comments:

## 2014-10-17 NOTE — Consult Note (Signed)
   [facility] CM Inpatient Consult   10/17/2014  Jane [name] 08-20-1928 [id number] Referral received for community follow up and care management needs for HF exacerbation. Met with patient who states she had previously been active with [facility].  Patient would like to have follow up with [facility].  She states she continues to live in her apartment alone.  Consent form signed and information given for restart of [facility] Care Management services.Patient will receive follow up phone calls and be evaluated for home visits as well. For additional questions, please contact: Natividad Brood, RN BSN [facility] Liaison  [phone] business mobile phone

## 2014-10-18 DIAGNOSIS — Z8673 Personal history of transient ischemic attack (TIA), and cerebral infarction without residual deficits: Secondary | ICD-10-CM | POA: Diagnosis not present

## 2014-10-18 DIAGNOSIS — J449 Chronic obstructive pulmonary disease, unspecified: Secondary | ICD-10-CM | POA: Diagnosis not present

## 2014-10-18 DIAGNOSIS — I509 Heart failure, unspecified: Secondary | ICD-10-CM | POA: Diagnosis not present

## 2014-10-18 DIAGNOSIS — I110 Hypertensive heart disease with heart failure: Secondary | ICD-10-CM | POA: Diagnosis not present

## 2014-10-18 DIAGNOSIS — I214 Non-ST elevation (NSTEMI) myocardial infarction: Secondary | ICD-10-CM | POA: Diagnosis not present

## 2014-10-18 DIAGNOSIS — N183 Chronic kidney disease, stage 3 (moderate): Secondary | ICD-10-CM | POA: Diagnosis not present

## 2014-10-18 LAB — MULTIPLE MYELOMA PANEL, SERUM
ALBUMIN SERPL ELPH-MCNC: 3.7 g/dL (ref 3.2–5.6)
ALPHA 1: 0.2 g/dL (ref 0.1–0.4)
Albumin/Glob SerPl: 1.2 (ref 0.7–2.0)
Alpha2 Glob SerPl Elph-Mcnc: 1 g/dL (ref 0.4–1.2)
B-Globulin SerPl Elph-Mcnc: 0.9 g/dL (ref 0.6–1.3)
Gamma Glob SerPl Elph-Mcnc: 1.2 g/dL (ref 0.5–1.6)
Globulin, Total: 3.2 g/dL (ref 2.0–4.5)
IGG (IMMUNOGLOBIN G), SERUM: 1319 mg/dL (ref 700–1600)
IGM, SERUM: 112 mg/dL (ref 26–217)
IgA: 227 mg/dL (ref 64–422)
Total Protein ELP: 6.9 g/dL (ref 6.0–8.5)

## 2014-10-18 LAB — VITAMIN D 25 HYDROXY (VIT D DEFICIENCY, FRACTURES): VIT D 25 HYDROXY: 10 ng/mL — AB (ref 30.0–100.0)

## 2014-10-18 LAB — INTRINSIC FACTOR ANTIBODIES: Intrinsic Factor: 196.3 AU/mL — ABNORMAL HIGH (ref 0.0–1.1)

## 2014-10-18 NOTE — Progress Notes (Signed)
Patient ID: Jane [name], female   DOB: Oct 13, 1928, 81 y.o.   MRN: [id number] Medicine attending discharge note: I personally interviewed this patient on the day of discharge and I attest to the accuracy of the evaluation and management plan as recorded by resident physician Dr. Dellia Nims.  This 81 year old woman was just admitted  On 4/17 with a mild exacerbation of chronic congestive heart failure likely related to temporary discontinuation of her diuretics. She stabilized rapidly with minimal treatment. There was no evidence for any acute myocardial injury. Please see my attending history and physical for full details of her medical history, and initial evaluation. Additional laboratory studies to further evaluate her anemia are in progress.  She was discharged in stable condition on April 18. There were no complications. She will follow-up in our internal medicine clinic.

## 2014-10-19 DIAGNOSIS — I509 Heart failure, unspecified: Secondary | ICD-10-CM | POA: Diagnosis not present

## 2014-10-19 DIAGNOSIS — I214 Non-ST elevation (NSTEMI) myocardial infarction: Secondary | ICD-10-CM | POA: Diagnosis not present

## 2014-10-19 DIAGNOSIS — J449 Chronic obstructive pulmonary disease, unspecified: Secondary | ICD-10-CM | POA: Diagnosis not present

## 2014-10-19 DIAGNOSIS — Z8673 Personal history of transient ischemic attack (TIA), and cerebral infarction without residual deficits: Secondary | ICD-10-CM | POA: Diagnosis not present

## 2014-10-19 DIAGNOSIS — N183 Chronic kidney disease, stage 3 (moderate): Secondary | ICD-10-CM | POA: Diagnosis not present

## 2014-10-19 DIAGNOSIS — I110 Hypertensive heart disease with heart failure: Secondary | ICD-10-CM | POA: Diagnosis not present

## 2014-10-20 DIAGNOSIS — I110 Hypertensive heart disease with heart failure: Secondary | ICD-10-CM | POA: Diagnosis not present

## 2014-10-20 DIAGNOSIS — Z8673 Personal history of transient ischemic attack (TIA), and cerebral infarction without residual deficits: Secondary | ICD-10-CM | POA: Diagnosis not present

## 2014-10-20 DIAGNOSIS — N183 Chronic kidney disease, stage 3 (moderate): Secondary | ICD-10-CM | POA: Diagnosis not present

## 2014-10-20 DIAGNOSIS — I509 Heart failure, unspecified: Secondary | ICD-10-CM | POA: Diagnosis not present

## 2014-10-20 DIAGNOSIS — I214 Non-ST elevation (NSTEMI) myocardial infarction: Secondary | ICD-10-CM | POA: Diagnosis not present

## 2014-10-20 DIAGNOSIS — J449 Chronic obstructive pulmonary disease, unspecified: Secondary | ICD-10-CM | POA: Diagnosis not present

## 2014-10-21 ENCOUNTER — Ambulatory Visit: Payer: Medicare Other | Admitting: Internal Medicine

## 2014-10-27 ENCOUNTER — Encounter: Payer: Medicare Other | Admitting: Internal Medicine

## 2014-11-02 ENCOUNTER — Encounter: Payer: Self-pay | Admitting: Pulmonary Disease

## 2014-11-02 ENCOUNTER — Ambulatory Visit (INDEPENDENT_AMBULATORY_CARE_PROVIDER_SITE_OTHER): Payer: Medicare Other | Admitting: Pulmonary Disease

## 2014-11-02 VITALS — BP 146/53 | HR 69 | Temp 97.7°F | Ht 65.0 in | Wt 160.8 lb

## 2014-11-02 DIAGNOSIS — E538 Deficiency of other specified B group vitamins: Secondary | ICD-10-CM

## 2014-11-02 DIAGNOSIS — I509 Heart failure, unspecified: Secondary | ICD-10-CM

## 2014-11-02 DIAGNOSIS — I5030 Unspecified diastolic (congestive) heart failure: Secondary | ICD-10-CM

## 2014-11-02 MED ORDER — CYANOCOBALAMIN 1000 MCG PO TABS: 1000.0000 ug | ORAL_TABLET | Freq: Every day | ORAL | Status: DC

## 2014-11-02 NOTE — Progress Notes (Signed)
Subjective:   Patient ID: Jane [name], female    DOB: 10/02/1928, 81 y.o.   MRN: [id number]  HPI Jane [name] is an 81 year old woman with history of anemia, GERD, HLD, HTN, CKD, CHF, CAD presenting for hospital follow up.  She was hospitalized 10/16/2014 to 10/17/2014 for acute exacerbation of her CHF 2/2 being off of Lasix. Her lasix 40mg  daily was resumed.   Today she reports she is doing well and has no complaints.  Review of Systems Constitutional: no fevers/chills Eyes: no vision changes Ears, nose, mouth, throat, and face: no cough Respiratory: no shortness of breath Cardiovascular: no chest pain Gastrointestinal: no nausea/vomiting, no abdominal pain, no constipation, no diarrhea Genitourinary: no dysuria, no hematuria Integument: no rash Hematologic/lymphatic: no bleeding/bruising, no edema Musculoskeletal: no arthralgias, no myalgias Neurological: no paresthesias, no weakness  Past Medical History  Diagnosis Date  . Anemia   . Anxiety   . Arthritis   . GERD (gastroesophageal reflux disease) 2009    EGD with benign gastric polyp too  . Hyperlipidemia   . Hypertension   . Vitamin D deficiency   . Colon polyp     2009 colonoscopy, not retrieved for pathology  . Hiatal hernia   . Diverticulosis 2009  . Aortic insufficiency     mild  . Stroke 1975  . CKD (chronic kidney disease) stage 3, GFR 30-59 ml/min   . CHF (congestive heart failure)   . CAD (coronary artery disease)     cath 08/09/2014 95% stenosis in prox to mid RCA s/p DES, 80-90% prox OM2, 50% distal LAD    Current Outpatient Prescriptions on File Prior to Visit  Medication Sig Dispense Refill  . acetaminophen (TYLENOL) 325 MG tablet Take 650 mg by mouth every 6 (six) hours as needed for mild pain.    Marland Kitchen albuterol (PROVENTIL HFA;VENTOLIN HFA) 108 (90 BASE) MCG/ACT inhaler Inhale 2 puffs into the lungs every 4 (four) hours as needed for wheezing or shortness of breath. 1 Inhaler 3  . aspirin EC  81 MG tablet Take 81 mg by mouth daily.    Marland Kitchen atorvastatin (LIPITOR) 20 MG tablet Take 1 tablet (20 mg total) by mouth daily at 6 PM. 30 tablet 3  . bisoprolol (ZEBETA) 5 MG tablet Take 0.5 tablets (2.5 mg total) by mouth daily. 90 tablet 1  . ferrous sulfate 325 (65 FE) MG tablet take 1 tablet by mouth twice a day 60 tablet 3  . furosemide (LASIX) 40 MG tablet Take 1 tablet (40 mg total) by mouth daily. 90 tablet 3  . hydroxypropyl methylcellulose / hypromellose (ISOPTO TEARS / GONIOVISC) 2.5 % ophthalmic solution Place 1 drop into the left eye 4 (four) times daily as needed for dry eyes. 15 mL 2  . isosorbide mononitrate (IMDUR) 30 MG 24 hr tablet Take 1 tablet (30 mg total) by mouth daily. 30 tablet 1  . omeprazole (PRILOSEC) 20 MG capsule Take 1 capsule (20 mg total) by mouth 2 (two) times daily before a meal. 30 capsule 0  . potassium chloride SA (K-DUR,KLOR-CON) 20 MEQ tablet take 1 tablet by mouth once daily 90 tablet 3  . ticagrelor (BRILINTA) 90 MG TABS tablet Take 1 tablet (90 mg total) by mouth 2 (two) times daily. 60 tablet 1  . vitamin B-12 1000 MCG tablet Take 1 tablet (1,000 mcg total) by mouth daily. 90 tablet 0   No current facility-administered medications on file prior to visit.    Today's Vitals  11/02/14 1433  BP: 146/53  Pulse: 69  Temp: 97.7 F (36.5 C)  TempSrc: Oral  Height: 5\' 5"  (1.651 m)  Weight: 160 lb 12.8 oz (72.938 kg)  SpO2: 100%  PainSc: 0-No pain    Objective:  Physical Exam  Constitutional: She is oriented to person, place, and time. She appears well-developed. No distress.  HENT:  Head: Normocephalic and atraumatic.  Mouth/Throat: Oropharynx is clear and moist.  Eyes: EOM are normal. Pupils are equal, round, and reactive to light.  Left lid ectropion. The conjunctiva is erythematous.   Neck: Neck supple.  Cardiovascular: Normal rate, regular rhythm and normal heart sounds.  Exam reveals no gallop and no friction rub.   No murmur  heard. Pulmonary/Chest: Effort normal and breath sounds normal. No respiratory distress. She has no wheezes. She has no rales.  Abdominal: Soft. Bowel sounds are normal. She exhibits no distension.  Musculoskeletal: Normal range of motion. She exhibits no edema or tenderness.  Neurological: She is alert and oriented to person, place, and time. No cranial nerve deficit.  Skin: Skin is warm and dry. She is not diaphoretic.  Psychiatric: She has a normal mood and affect.     Assessment & Plan:  Please refer to problem based charting.

## 2014-11-03 NOTE — Assessment & Plan Note (Addendum)
Assessment: Intrinsic factor antibody 196.3 AU/mL  Plan:  -Continue vitamin B12 (cyanocobalamin) 1076mcg tablet daily. May consider switching to IM. -Follow up in 2 weeks to recheck vitamin B12 level.

## 2014-11-03 NOTE — Assessment & Plan Note (Addendum)
Assessment: Hospitalized 10/16/2014 to 10/17/2014 for acute exacerbation of her CHF 2/2 being off of Lasix. Her lasix 40mg  daily was resumed. Weight stable.  Plan:  -Continue bisoprolol 2.5mg  daily, lasix 40mg  daily, KCl PO 64meq daily.  -She did not get her BMP at this visit. Will need to order this at next follow up.

## 2014-11-04 NOTE — Progress Notes (Signed)
INTERNAL MEDICINE TEACHING ATTENDING ADDENDUM - Rosali Augello, MD: I reviewed and discussed at the time of visit with the resident Dr. Krall, the patient's medical history, physical examination, diagnosis and results of pertinent tests and treatment and I agree with the patient's care as documented.  

## 2014-11-07 NOTE — Addendum Note (Signed)
Addended by: Orson Gear on: 11/07/2014 08:55 AM   Modules accepted: Orders

## 2014-11-08 ENCOUNTER — Encounter: Payer: Self-pay | Admitting: *Deleted

## 2014-11-17 NOTE — Addendum Note (Signed)
Addended by: Hulan Fray on: 11/17/2014 05:29 PM   Modules accepted: Orders

## 2014-11-29 ENCOUNTER — Other Ambulatory Visit: Payer: Self-pay | Admitting: Internal Medicine

## 2014-11-30 ENCOUNTER — Other Ambulatory Visit: Payer: Self-pay | Admitting: *Deleted

## 2014-12-01 ENCOUNTER — Other Ambulatory Visit: Payer: Self-pay | Admitting: Internal Medicine

## 2014-12-01 MED ORDER — FERROUS SULFATE 325 (65 FE) MG PO TABS: 325.0000 mg | ORAL_TABLET | Freq: Two times a day (BID) | ORAL | Status: DC

## 2014-12-05 ENCOUNTER — Ambulatory Visit: Payer: Medicare Other | Admitting: Pulmonary Disease

## 2014-12-07 ENCOUNTER — Emergency Department (HOSPITAL_COMMUNITY)
Admission: EM | Admit: 2014-12-07 | Discharge: 2014-12-07 | Disposition: A | Discharge status: Home / Self Care | Payer: Medicare Other | Attending: Emergency Medicine | Admitting: Emergency Medicine

## 2014-12-07 ENCOUNTER — Emergency Department (HOSPITAL_COMMUNITY): Payer: Medicare Other

## 2014-12-07 ENCOUNTER — Encounter (HOSPITAL_COMMUNITY): Payer: Self-pay | Admitting: *Deleted

## 2014-12-07 DIAGNOSIS — Z8719 Personal history of other diseases of the digestive system: Secondary | ICD-10-CM | POA: Diagnosis not present

## 2014-12-07 DIAGNOSIS — I509 Heart failure, unspecified: Secondary | ICD-10-CM | POA: Diagnosis not present

## 2014-12-07 DIAGNOSIS — I2510 Atherosclerotic heart disease of native coronary artery without angina pectoris: Secondary | ICD-10-CM | POA: Insufficient documentation

## 2014-12-07 DIAGNOSIS — Z8673 Personal history of transient ischemic attack (TIA), and cerebral infarction without residual deficits: Secondary | ICD-10-CM | POA: Insufficient documentation

## 2014-12-07 DIAGNOSIS — D649 Anemia, unspecified: Secondary | ICD-10-CM | POA: Diagnosis not present

## 2014-12-07 DIAGNOSIS — K219 Gastro-esophageal reflux disease without esophagitis: Secondary | ICD-10-CM | POA: Insufficient documentation

## 2014-12-07 DIAGNOSIS — R0602 Shortness of breath: Secondary | ICD-10-CM | POA: Diagnosis not present

## 2014-12-07 DIAGNOSIS — Z86010 Personal history of colonic polyps: Secondary | ICD-10-CM | POA: Diagnosis not present

## 2014-12-07 DIAGNOSIS — Z7982 Long term (current) use of aspirin: Secondary | ICD-10-CM | POA: Diagnosis not present

## 2014-12-07 DIAGNOSIS — Z79899 Other long term (current) drug therapy: Secondary | ICD-10-CM | POA: Insufficient documentation

## 2014-12-07 DIAGNOSIS — J029 Acute pharyngitis, unspecified: Secondary | ICD-10-CM | POA: Insufficient documentation

## 2014-12-07 DIAGNOSIS — Z87891 Personal history of nicotine dependence: Secondary | ICD-10-CM | POA: Insufficient documentation

## 2014-12-07 DIAGNOSIS — N183 Chronic kidney disease, stage 3 (moderate): Secondary | ICD-10-CM | POA: Insufficient documentation

## 2014-12-07 DIAGNOSIS — Z7951 Long term (current) use of inhaled steroids: Secondary | ICD-10-CM | POA: Diagnosis not present

## 2014-12-07 DIAGNOSIS — Z9861 Coronary angioplasty status: Secondary | ICD-10-CM | POA: Diagnosis not present

## 2014-12-07 DIAGNOSIS — Z8739 Personal history of other diseases of the musculoskeletal system and connective tissue: Secondary | ICD-10-CM | POA: Insufficient documentation

## 2014-12-07 DIAGNOSIS — R069 Unspecified abnormalities of breathing: Secondary | ICD-10-CM | POA: Diagnosis not present

## 2014-12-07 DIAGNOSIS — I129 Hypertensive chronic kidney disease with stage 1 through stage 4 chronic kidney disease, or unspecified chronic kidney disease: Secondary | ICD-10-CM | POA: Diagnosis not present

## 2014-12-07 DIAGNOSIS — R079 Chest pain, unspecified: Secondary | ICD-10-CM | POA: Diagnosis not present

## 2014-12-07 DIAGNOSIS — Z8659 Personal history of other mental and behavioral disorders: Secondary | ICD-10-CM | POA: Insufficient documentation

## 2014-12-07 DIAGNOSIS — K210 Gastro-esophageal reflux disease with esophagitis: Secondary | ICD-10-CM | POA: Diagnosis not present

## 2014-12-07 LAB — CBC WITH DIFFERENTIAL/PLATELET
BASOS PCT: 0 % (ref 0–1)
Basophils Absolute: 0 10*3/uL (ref 0.0–0.1)
EOS ABS: 0 10*3/uL (ref 0.0–0.7)
Eosinophils Relative: 0 % (ref 0–5)
HCT: 29.9 % — ABNORMAL LOW (ref 36.0–46.0)
Hemoglobin: 9.7 g/dL — ABNORMAL LOW (ref 12.0–15.0)
LYMPHS ABS: 1.5 10*3/uL (ref 0.7–4.0)
Lymphocytes Relative: 31 % (ref 12–46)
MCH: 30.8 pg (ref 26.0–34.0)
MCHC: 32.4 g/dL (ref 30.0–36.0)
MCV: 94.9 fL (ref 78.0–100.0)
MONOS PCT: 7 % (ref 3–12)
Monocytes Absolute: 0.4 10*3/uL (ref 0.1–1.0)
NEUTROS ABS: 3 10*3/uL (ref 1.7–7.7)
NEUTROS PCT: 62 % (ref 43–77)
PLATELETS: 156 10*3/uL (ref 150–400)
RBC: 3.15 MIL/uL — AB (ref 3.87–5.11)
RDW: 13.9 % (ref 11.5–15.5)
WBC: 4.9 10*3/uL (ref 4.0–10.5)

## 2014-12-07 LAB — BASIC METABOLIC PANEL
Anion gap: 9 (ref 5–15)
BUN: 20 mg/dL (ref 6–20)
CHLORIDE: 108 mmol/L (ref 101–111)
CO2: 25 mmol/L (ref 22–32)
Calcium: 8.9 mg/dL (ref 8.9–10.3)
Creatinine, Ser: 1.06 mg/dL — ABNORMAL HIGH (ref 0.44–1.00)
GFR calc Af Amer: 54 mL/min — ABNORMAL LOW (ref >60)
GFR, EST NON AFRICAN AMERICAN: 47 mL/min — AB (ref >60)
Glucose, Bld: 102 mg/dL — ABNORMAL HIGH (ref 65–99)
POTASSIUM: 4.1 mmol/L (ref 3.5–5.1)
Sodium: 142 mmol/L (ref 135–145)

## 2014-12-07 LAB — I-STAT TROPONIN, ED: Troponin i, poc: 0 ng/mL (ref 0.00–0.08)

## 2014-12-07 LAB — BRAIN NATRIURETIC PEPTIDE: B NATRIURETIC PEPTIDE 5: 127.3 pg/mL — AB (ref 0.0–100.0)

## 2014-12-07 MED ORDER — PANTOPRAZOLE SODIUM 20 MG PO TBEC: 20.0000 mg | DELAYED_RELEASE_TABLET | Freq: Every day | ORAL | Status: DC

## 2014-12-07 MED ORDER — PANTOPRAZOLE SODIUM 20 MG PO TBEC
20.0000 mg | DELAYED_RELEASE_TABLET | Freq: Once | ORAL | Status: AC
  Administered 2014-12-07: 20 mg via ORAL
  Filled 2014-12-07: qty 1

## 2014-12-07 MED ORDER — GI COCKTAIL ~~LOC~~
30.0000 mL | Freq: Once | ORAL | Status: AC
  Administered 2014-12-07: 30 mL via ORAL
  Filled 2014-12-07: qty 30

## 2014-12-07 NOTE — ED Notes (Signed)
Ambulated pt w/ 2-staff assist from B19-B18 and then pt ambulated to restroom. o2 100% while ambulating

## 2014-12-07 NOTE — ED Provider Notes (Signed)
CSN: [id number]     Arrival date & time 12/07/14  0330 History   First MD Initiated Contact with Patient 12/07/14 [phone]     Chief Complaint  Patient presents with  . Sore Throat     (Consider location/radiation/quality/duration/timing/severity/associated sxs/prior Treatment) HPI  This is an 14 female with history of anxiety, GERD, hypertension, CHF, CAD who presents with sore throat. Patient reports burning in her throat and chest. States that she took her albuterol prior to EMS arrival they did not help. At baseline reports shortness of breath. Denies any chest pain. Denies any fever or neck pain. Patient states when she gets to feeling this bad, she was told to come to the hospital.  Past Medical History  Diagnosis Date  . Anemia   . Anxiety   . Arthritis   . GERD (gastroesophageal reflux disease) 2009    EGD with benign gastric polyp too  . Hyperlipidemia   . Hypertension   . Vitamin D deficiency   . Colon polyp     2009 colonoscopy, not retrieved for pathology  . Hiatal hernia   . Diverticulosis 2009  . Aortic insufficiency     mild  . Stroke 1975  . CKD (chronic kidney disease) stage 3, GFR 30-59 ml/min   . CHF (congestive heart failure)   . CAD (coronary artery disease)     cath 08/09/2014 95% stenosis in prox to mid RCA s/p DES, 80-90% prox OM2, 50% distal LAD   Past Surgical History  Procedure Laterality Date  . Appendectomy    . Abdominal hysterectomy    . Artery biopsy Left 12/16/2012    Procedure: BIOPSY TEMPORAL ARTERY;  Surgeon: Mal Misty, MD;  Location: [facility];  Service: Vascular;  Laterality: Left;  . Left heart catheterization with coronary angiogram N/A 08/09/2014    Procedure: LEFT HEART CATHETERIZATION WITH CORONARY ANGIOGRAM;  Surgeon: Peter M Martinique, MD;  Location: [facility] CATH LAB;  Service: Cardiovascular;  Laterality: N/A;  . Percutaneous coronary stent intervention (pci-s)  08/09/2014    Procedure: PERCUTANEOUS CORONARY STENT INTERVENTION (PCI-S);  Surgeon:  Peter M Martinique, MD;  Location: [facility] CATH LAB;  Service: Cardiovascular;;   Family History  Problem Relation Age of Onset  . Stroke Mother   . Hypertension Mother   . Stroke Father   . Hypertension Father   . Diabetes Sister    History  Substance Use Topics  . Smoking status: Former Smoker    Types: Cigarettes    Quit date: 07/02/1979  . Smokeless tobacco: Former Systems developer  . Alcohol Use: No   OB History    No data available     Review of Systems  Constitutional: Negative for fever.  HENT: Positive for sore throat.   Respiratory: Positive for shortness of breath. Negative for cough and chest tightness.   Cardiovascular: Negative for chest pain.  Gastrointestinal: Negative for nausea, vomiting and abdominal pain.  Genitourinary: Negative for dysuria.  Neurological: Negative for headaches.  Psychiatric/Behavioral: Negative for confusion.  All other systems reviewed and are negative.     Allergies  Review of patient's allergies indicates no known allergies.  Home Medications   Prior to Admission medications   Medication Sig Start Date End Date Taking? Authorizing Provider  acetaminophen (TYLENOL) 325 MG tablet Take 650 mg by mouth every 6 (six) hours as needed for mild pain.   Yes Historical Provider, MD  albuterol (PROVENTIL HFA;VENTOLIN HFA) 108 (90 BASE) MCG/ACT inhaler Inhale 2 puffs into the lungs every 4 (  four) hours as needed for wheezing or shortness of breath. 08/05/14  Yes Luan Moore, MD  aspirin EC 81 MG tablet Take 81 mg by mouth daily.   Yes Historical Provider, MD  atorvastatin (LIPITOR) 20 MG tablet Take 1 tablet (20 mg total) by mouth daily at 6 PM. 08/10/14  Yes Francesca Oman, DO  bisoprolol (ZEBETA) 5 MG tablet Take 0.5 tablets (2.5 mg total) by mouth daily. 10/17/14  Yes Tasrif Ahmed, MD  cyanocobalamin 1000 MCG tablet Take 1 tablet (1,000 mcg total) by mouth daily. 11/02/14  Yes Milagros Loll, MD  ferrous sulfate 325 (65 FE) MG tablet Take 1 tablet (325 mg  total) by mouth 2 (two) times daily. 12/01/14  Yes Milagros Loll, MD  furosemide (LASIX) 40 MG tablet Take 1 tablet (40 mg total) by mouth daily. 10/17/14  Yes Tasrif Ahmed, MD  hydroxypropyl methylcellulose / hypromellose (ISOPTO TEARS / GONIOVISC) 2.5 % ophthalmic solution Place 1 drop into the left eye 4 (four) times daily as needed for dry eyes. 04/12/14  Yes Noland Fordyce, PA-C  isosorbide mononitrate (IMDUR) 30 MG 24 hr tablet Take 1 tablet (30 mg total) by mouth daily. 08/10/14  Yes Francesca Oman, DO  omeprazole (PRILOSEC) 20 MG capsule Take 1 capsule (20 mg total) by mouth 2 (two) times daily before a meal. 04/12/14  Yes Noland Fordyce, PA-C  potassium chloride SA (K-DUR,KLOR-CON) 20 MEQ tablet take 1 tablet by mouth once daily 08/03/14  Yes Luan Moore, MD  ticagrelor (BRILINTA) 90 MG TABS tablet Take 1 tablet (90 mg total) by mouth 2 (two) times daily. 09/29/14  Yes Francesca Oman, DO  pantoprazole (PROTONIX) 20 MG tablet Take 1 tablet (20 mg total) by mouth daily. 12/07/14   Merryl Hacker, MD   BP 133/51 mmHg  Pulse 67  Temp(Src) 97.6 F (36.4 C) (Oral)  Resp 15  Ht 5\' 4"  (1.626 m)  Wt 155 lb (70.308 kg)  BMI 26.59 kg/m2  SpO2 98% Physical Exam  Constitutional: She is oriented to person, place, and time. No distress.  Elderly, chronically ill-appearing  HENT:  Head: Normocephalic and atraumatic.  Mouth/Throat: Oropharynx is clear and moist. No oropharyngeal exudate.  Eyes: Pupils are equal, round, and reactive to light.  Cardiovascular: Normal rate, regular rhythm and normal heart sounds.   Pulmonary/Chest: Effort normal and breath sounds normal. No respiratory distress. She has no wheezes.  Abdominal: Soft. Bowel sounds are normal. There is no tenderness. There is no rebound.  Neurological: She is alert and oriented to person, place, and time.  Skin: Skin is warm and dry.  Psychiatric: She has a normal mood and affect.  Nursing note and vitals reviewed.   ED Course   Procedures (including critical care time) Labs Review Labs Reviewed  CBC WITH DIFFERENTIAL/PLATELET - Abnormal; Notable for the following:    RBC 3.15 (*)    Hemoglobin 9.7 (*)    HCT 29.9 (*)    All other components within normal limits  BASIC METABOLIC PANEL - Abnormal; Notable for the following:    Glucose, Bld 102 (*)    Creatinine, Ser 1.06 (*)    GFR calc non Af Amer 47 (*)    GFR calc Af Amer 54 (*)    All other components within normal limits  BRAIN NATRIURETIC PEPTIDE - Abnormal; Notable for the following:    B Natriuretic Peptide 127.3 (*)    All other components within normal limits  I-STAT TROPOININ, ED  Imaging Review Dg Chest Portable 1 View  12/07/2014   CLINICAL DATA:  Chest pain and sore throat tonight  EXAM: PORTABLE CHEST - 1 VIEW  COMPARISON:  10/17/2014  FINDINGS: A single AP portable view of the chest demonstrates no focal airspace consolidation or alveolar edema. The lungs are grossly clear. There is no large effusion or pneumothorax. Cardiac and mediastinal contours appear unremarkable.  IMPRESSION: No active disease.   Electronically Signed   By: Andreas Newport M.D.   On: 12/07/2014 05:25     EKG Interpretation   Date/Time:  Wednesday December 07 2014 03:51:48 EDT Ventricular Rate:  87 PR Interval:  156 QRS Duration: 70 QT Interval:  392 QTC Calculation: 471 R Axis:   21 Text Interpretation:  Normal sinus rhythm Possible Left atrial enlargement  Borderline ECG Confirmed by HORTON  MD, COURTNEY ([id number]) on 12/07/2014  4:31:55 AM      MDM   Final diagnoses:  Sore throat  Shortness of breath   Patient presents with sore throat and baseline shortness of breath. EKG shows no signs of ischemia. Chest x-ray is reassuring. Patient does have a history of GERD and her description of sore throat makes me suspicious of reflux. Patient was given a GI cocktail and by mouth Protonix. Basic labwork is reassuring. Patient reports improvement of her symptoms with  GI cocktail and Protonix. Doubt ACS or other cause of symptoms. Patient was able to ambulate and maintain pulse oximetry. Discussed with patient using an acid reducer at home. She was given return precautions.  After history, exam, and medical workup I feel the patient has been appropriately medically screened and is safe for discharge home. Pertinent diagnoses were discussed with the patient. Patient was given return precautions.     Merryl Hacker, MD 12/08/14 [phone]

## 2014-12-07 NOTE — ED Notes (Addendum)
Pt arrived by [facility], reports burning pain to her throat since this am. Used inhaler at home but no relief. No resp distress noted. spo2 100%.

## 2014-12-07 NOTE — Discharge Instructions (Signed)
You were seen today for shortness of breath and sore throat. Her sore throat may be related to reflux. You will be started on a acid reducer.  Your workup for shortness of breath is reassuring. You improved from the emergency room. You should follow-up with your primary physician if you have any new or worsening symptoms.  Shortness of Breath Shortness of breath means you have trouble breathing. It could also mean that you have a medical problem. You should get immediate medical care for shortness of breath. CAUSES   Not enough oxygen in the air such as with high altitudes or a smoke-filled room.  Certain lung diseases, infections, or problems.  Heart disease or conditions, such as angina or heart failure.  Low red blood cells (anemia).  Poor physical fitness, which can cause shortness of breath when you exercise.  Chest or back injuries or stiffness.  Being overweight.  Smoking.  Anxiety, which can make you feel like you are not getting enough air. DIAGNOSIS  Serious medical problems can often be found during your physical exam. Tests may also be done to determine why you are having shortness of breath. Tests may include:  Chest X-rays.  Lung function tests.  Blood tests.  An electrocardiogram (ECG).  An ambulatory electrocardiogram. An ambulatory ECG records your heartbeat patterns over a 24-hour period.  Exercise testing.  A transthoracic echocardiogram (TTE). During echocardiography, sound waves are used to evaluate how blood flows through your heart.  A transesophageal echocardiogram (TEE).  Imaging scans. Your health care provider may not be able to find a cause for your shortness of breath after your exam. In this case, it is important to have a follow-up exam with your health care provider as directed.  TREATMENT  Treatment for shortness of breath depends on the cause of your symptoms and can vary greatly. HOME CARE INSTRUCTIONS   Do not smoke. Smoking is a  common cause of shortness of breath. If you smoke, ask for help to quit.  Avoid being around chemicals or things that may bother your breathing, such as paint fumes and dust.  Rest as needed. Slowly resume your usual activities.  If medicines were prescribed, take them as directed for the full length of time directed. This includes oxygen and any inhaled medicines.  Keep all follow-up appointments as directed by your health care provider. SEEK MEDICAL CARE IF:   Your condition does not improve in the time expected.  You have a hard time doing your normal activities even with rest.  You have any new symptoms. SEEK IMMEDIATE MEDICAL CARE IF:   Your shortness of breath gets worse.  You feel light-headed, faint, or develop a cough not controlled with medicines.  You start coughing up blood.  You have pain with breathing.  You have chest pain or pain in your arms, shoulders, or abdomen.  You have a fever.  You are unable to walk up stairs or exercise the way you normally do. MAKE SURE YOU:  Understand these instructions.  Will watch your condition.  Will get help right away if you are not doing well or get worse. Document Released: 03/12/2001 Document Revised: 06/22/2013 Document Reviewed: 09/02/2011 [facility] Patient Information 2015 [place], [place]. This information is not intended to replace advice given to you by your health care provider. Make sure you discuss any questions you have with your health care provider.

## 2014-12-07 NOTE — ED Notes (Signed)
Ambulated pt with a walker and the pt walked much better. Pt O2 sats stayed between 97% & 100% while ambulating.

## 2014-12-07 NOTE — ED Notes (Signed)
Ambulated pt to the door and the pt was very unsteady.

## 2014-12-08 ENCOUNTER — Ambulatory Visit (INDEPENDENT_AMBULATORY_CARE_PROVIDER_SITE_OTHER): Payer: Medicare Other | Admitting: Pulmonary Disease

## 2014-12-08 ENCOUNTER — Encounter: Payer: Self-pay | Admitting: Pulmonary Disease

## 2014-12-08 VITALS — BP 145/60 | HR 84 | Temp 98.1°F | Ht 65.0 in | Wt 160.1 lb

## 2014-12-08 DIAGNOSIS — E559 Vitamin D deficiency, unspecified: Secondary | ICD-10-CM

## 2014-12-08 DIAGNOSIS — E538 Deficiency of other specified B group vitamins: Secondary | ICD-10-CM

## 2014-12-08 DIAGNOSIS — I509 Heart failure, unspecified: Secondary | ICD-10-CM

## 2014-12-08 DIAGNOSIS — K219 Gastro-esophageal reflux disease without esophagitis: Secondary | ICD-10-CM | POA: Diagnosis present

## 2014-12-08 DIAGNOSIS — D649 Anemia, unspecified: Secondary | ICD-10-CM | POA: Diagnosis not present

## 2014-12-08 DIAGNOSIS — Z23 Encounter for immunization: Secondary | ICD-10-CM | POA: Diagnosis not present

## 2014-12-08 DIAGNOSIS — B351 Tinea unguium: Secondary | ICD-10-CM | POA: Diagnosis not present

## 2014-12-08 DIAGNOSIS — I5030 Unspecified diastolic (congestive) heart failure: Secondary | ICD-10-CM

## 2014-12-08 MED ORDER — CYANOCOBALAMIN 1000 MCG/ML IJ SOLN: 1000.0000 ug | INTRAMUSCULAR | Status: DC

## 2014-12-08 MED ORDER — LOSARTAN POTASSIUM 50 MG PO TABS: 50.0000 mg | ORAL_TABLET | Freq: Every day | ORAL | Status: DC

## 2014-12-08 MED ORDER — VITAMIN D (CHOLECALCIFEROL) 25 MCG (1000 UT) PO CAPS: 1.0000 | ORAL_CAPSULE | Freq: Every day | ORAL | Status: DC

## 2014-12-08 MED ORDER — CYANOCOBALAMIN 1000 MCG/ML IJ SOLN
1000.0000 ug | Freq: Once | INTRAMUSCULAR | Status: AC
  Administered 2014-12-08: 1000 ug via INTRAMUSCULAR

## 2014-12-08 NOTE — Progress Notes (Signed)
Subjective:    Patient ID: Jane [name], female    DOB: [birth year], 81 y.o.   MRN: [id number]  HPI Jane [name] is an 81 year old woman with history of anemia anemia, GERD, hyperlipidemia, stroke, CAD, chronic diastolic CHF, COPD, CKD stage III, hypertension presenting for follow-up.  She is seen in the ED 12/07/2014 for sore throat and shortness of breath. Chest x-ray with no acute abnormalities. EKG with no ischemic changes. Troponin POC negative. BNP 127.3 which is lower than her last BNP on 10/16/2014 of 401.2. She was given a GI cocktail and Protonix with improvement of her symptoms. She has discharged with Protonix 20 mg daily.  She has no complaints today. She reports her GERD improved.  Review of Systems Constitutional: no fevers/chills Eyes: no vision changes Ears, nose, mouth, throat, and face: no cough Respiratory: no shortness of breath Cardiovascular: no chest pain Gastrointestinal: no nausea/vomiting, no abdominal pain, no constipation, no diarrhea Genitourinary: no dysuria, no hematuria Integument: no rash Hematologic/lymphatic: no bleeding/bruising, no edema Musculoskeletal: no arthralgias, no myalgias Neurological: no paresthesias, no weakness  Past Medical History  Diagnosis Date  . Anemia   . Anxiety   . Arthritis   . GERD (gastroesophageal reflux disease) 2009    EGD with benign gastric polyp too  . Hyperlipidemia   . Hypertension   . Vitamin D deficiency   . Colon polyp     2009 colonoscopy, not retrieved for pathology  . Hiatal hernia   . Diverticulosis 2009  . Aortic insufficiency     mild  . Stroke 1975  . CKD (chronic kidney disease) stage 3, GFR 30-59 ml/min   . CHF (congestive heart failure)   . CAD (coronary artery disease)     cath 08/09/2014 95% stenosis in prox to mid RCA s/p DES, 80-90% prox OM2, 50% distal LAD    Current Outpatient Prescriptions on File Prior to Visit  Medication Sig Dispense Refill  . acetaminophen (TYLENOL) 325  MG tablet Take 650 mg by mouth every 6 (six) hours as needed for mild pain.    Marland Kitchen albuterol (PROVENTIL HFA;VENTOLIN HFA) 108 (90 BASE) MCG/ACT inhaler Inhale 2 puffs into the lungs every 4 (four) hours as needed for wheezing or shortness of breath. 1 Inhaler 3  . aspirin EC 81 MG tablet Take 81 mg by mouth daily.    Marland Kitchen atorvastatin (LIPITOR) 20 MG tablet Take 1 tablet (20 mg total) by mouth daily at 6 PM. 30 tablet 3  . bisoprolol (ZEBETA) 5 MG tablet Take 0.5 tablets (2.5 mg total) by mouth daily. 90 tablet 1  . cyanocobalamin 1000 MCG tablet Take 1 tablet (1,000 mcg total) by mouth daily. 90 tablet 2  . ferrous sulfate 325 (65 FE) MG tablet Take 1 tablet (325 mg total) by mouth 2 (two) times daily. 60 tablet 3  . furosemide (LASIX) 40 MG tablet Take 1 tablet (40 mg total) by mouth daily. 90 tablet 3  . hydroxypropyl methylcellulose / hypromellose (ISOPTO TEARS / GONIOVISC) 2.5 % ophthalmic solution Place 1 drop into the left eye 4 (four) times daily as needed for dry eyes. 15 mL 2  . isosorbide mononitrate (IMDUR) 30 MG 24 hr tablet Take 1 tablet (30 mg total) by mouth daily. 30 tablet 1  . omeprazole (PRILOSEC) 20 MG capsule Take 1 capsule (20 mg total) by mouth 2 (two) times daily before a meal. 30 capsule 0  . pantoprazole (PROTONIX) 20 MG tablet Take 1 tablet (20 mg total)  by mouth daily. 30 tablet 0  . potassium chloride SA (K-DUR,KLOR-CON) 20 MEQ tablet take 1 tablet by mouth once daily 90 tablet 3  . ticagrelor (BRILINTA) 90 MG TABS tablet Take 1 tablet (90 mg total) by mouth 2 (two) times daily. 60 tablet 1   No current facility-administered medications on file prior to visit.    Today's Vitals   12/08/14 1449  BP: 145/60  Pulse: 84  Temp: 98.1 F (36.7 C)  TempSrc: Oral  Height: 5\' 5"  (1.651 m)  Weight: 160 lb 1.6 oz (72.621 kg)  SpO2: 100%  PainSc: 0-No pain    Objective:  Physical Exam  Constitutional: She is oriented to person, place, and time. She appears well-developed.  No distress.  HENT:  Head: Normocephalic and atraumatic.  Mouth/Throat: Oropharynx is clear and moist.  Eyes: EOM are normal. Pupils are equal, round, and reactive to light.  Left lid ectropion. The conjunctiva is erythematous.   Neck: Neck supple.  Cardiovascular: Normal rate, regular rhythm and normal heart sounds.  Exam reveals no gallop and no friction rub.   No murmur heard. Pulmonary/Chest: Effort normal and breath sounds normal. No respiratory distress. She has no wheezes. She has no rales.  Abdominal: Soft. Bowel sounds are normal. She exhibits no distension.  Musculoskeletal: Normal range of motion. She exhibits no edema or tenderness.  Neurological: She is alert and oriented to person, place, and time. No cranial nerve deficit.  Skin: Skin is warm and dry. She is not diaphoretic.  Psychiatric: She has a normal mood and affect.   Assessment & Plan:  Please refer to problem based charting.

## 2014-12-08 NOTE — Patient Instructions (Signed)
General Instructions:   Thank you for bringing your medicines today. This helps [place] keep you safe from mistakes.   Progress Toward Treatment Goals:  Treatment Goal 08/09/2013  Blood pressure deteriorated    Self Care Goals & Plans:  Self Care Goal 10/15/2013  Manage my medications take my medicines as prescribed; bring my medications to every visit; refill my medications on time  Monitor my health keep track of my weight  Eat healthy foods eat more vegetables; eat foods that are low in salt  Be physically active -

## 2014-12-09 DIAGNOSIS — H4322 Crystalline deposits in vitreous body, left eye: Secondary | ICD-10-CM | POA: Diagnosis not present

## 2014-12-09 DIAGNOSIS — H02421 Myogenic ptosis of right eyelid: Secondary | ICD-10-CM | POA: Diagnosis not present

## 2014-12-09 DIAGNOSIS — H2513 Age-related nuclear cataract, bilateral: Secondary | ICD-10-CM | POA: Diagnosis not present

## 2014-12-09 DIAGNOSIS — H31091 Other chorioretinal scars, right eye: Secondary | ICD-10-CM | POA: Diagnosis not present

## 2014-12-09 DIAGNOSIS — H04522 Eversion of left lacrimal punctum: Secondary | ICD-10-CM | POA: Diagnosis not present

## 2014-12-09 DIAGNOSIS — H40013 Open angle with borderline findings, low risk, bilateral: Secondary | ICD-10-CM | POA: Diagnosis not present

## 2014-12-09 NOTE — Assessment & Plan Note (Signed)
She has a normocytic anemia. Baseline hemoglobin around 9.5. Last CBC on 12/07/2014 with hemoglobin 9.7. History of iron deficiency anemia noted in chart and is currently on iron supplementation. Iron study from 2009 unremarkable for iron deficiency. Repeat iron study 10/16/2014 with iron 75, ferritin 366, TIBC 221. Folate within normal limits at 18.2. Vitamin B12 low at 181.  Plan: -Discontinue ferrous sulfate -Vitamin B12 1000 MCG injection monthly

## 2014-12-09 NOTE — Assessment & Plan Note (Signed)
Continues to have severe onychomycosis.   Plan: -Referral placed for podiatrist

## 2014-12-09 NOTE — Assessment & Plan Note (Signed)
Vitamin D on 10/17/2014 low at 10.  Plan: -Vitamin D 1000 units daily -Follow-up level in 3-4 months

## 2014-12-09 NOTE — Assessment & Plan Note (Signed)
Weight stable from previous office visit. Denies shortness of breath. BMP on 12/07/2014 with creatinine 1.06 which is around her baseline of 1.07.  Plan: -Continue bisoprolol 2.5 mg daily -Lasix 40 mg daily, potassium chloride 20 mg once daily -Restart losartan 50 mg daily

## 2014-12-09 NOTE — Assessment & Plan Note (Addendum)
Started by ED on pantoprazole 20 mg daily.  Plan: -Continue pantoprazole 20 mg daily -Discontinue home omeprazole 20 mg daily -Will try new PPI for 2 months. If she continues to have symptoms, can try increasing to twice daily for 2 months.

## 2014-12-09 NOTE — Assessment & Plan Note (Signed)
Vitamin B12 low at 181.  Plan: -Vitamin B12 1000 MCG injection monthly -Follow up vitamin B12 level in 2-3 months

## 2014-12-10 NOTE — Progress Notes (Signed)
Medicine attending: Medical history, presenting problems, physical findings, and medications, reviewed with Dr Jennifer Krall on the day of the patient visit and I concur with her evaluation and management plan. 

## 2014-12-19 ENCOUNTER — Ambulatory Visit: Payer: Medicare Other

## 2014-12-19 ENCOUNTER — Encounter: Payer: Self-pay | Admitting: *Deleted

## 2014-12-20 ENCOUNTER — Ambulatory Visit: Payer: Medicare Other

## 2014-12-26 DIAGNOSIS — H16212 Exposure keratoconjunctivitis, left eye: Secondary | ICD-10-CM | POA: Diagnosis not present

## 2014-12-26 DIAGNOSIS — H02115 Cicatricial ectropion of left lower eyelid: Secondary | ICD-10-CM | POA: Diagnosis not present

## 2014-12-30 ENCOUNTER — Other Ambulatory Visit: Payer: Self-pay | Admitting: Internal Medicine

## 2015-01-12 ENCOUNTER — Encounter: Payer: Medicare Other | Admitting: Pulmonary Disease

## 2015-01-19 NOTE — Addendum Note (Signed)
Addended by: Hulan Fray on: 01/19/2015 07:07 AM   Modules accepted: Orders

## 2015-02-05 ENCOUNTER — Encounter (HOSPITAL_COMMUNITY): Payer: Self-pay | Admitting: Emergency Medicine

## 2015-02-05 ENCOUNTER — Emergency Department (HOSPITAL_COMMUNITY): Payer: Medicare Other

## 2015-02-05 ENCOUNTER — Emergency Department (HOSPITAL_COMMUNITY)
Admission: EM | Admit: 2015-02-05 | Discharge: 2015-02-05 | Disposition: A | Discharge status: Home / Self Care | Payer: Medicare Other | Attending: Emergency Medicine | Admitting: Emergency Medicine

## 2015-02-05 DIAGNOSIS — Z9889 Other specified postprocedural states: Secondary | ICD-10-CM | POA: Insufficient documentation

## 2015-02-05 DIAGNOSIS — I509 Heart failure, unspecified: Secondary | ICD-10-CM | POA: Insufficient documentation

## 2015-02-05 DIAGNOSIS — E785 Hyperlipidemia, unspecified: Secondary | ICD-10-CM | POA: Insufficient documentation

## 2015-02-05 DIAGNOSIS — Z86010 Personal history of colonic polyps: Secondary | ICD-10-CM | POA: Diagnosis not present

## 2015-02-05 DIAGNOSIS — Z862 Personal history of diseases of the blood and blood-forming organs and certain disorders involving the immune mechanism: Secondary | ICD-10-CM | POA: Insufficient documentation

## 2015-02-05 DIAGNOSIS — K219 Gastro-esophageal reflux disease without esophagitis: Secondary | ICD-10-CM | POA: Diagnosis not present

## 2015-02-05 DIAGNOSIS — N183 Chronic kidney disease, stage 3 (moderate): Secondary | ICD-10-CM | POA: Diagnosis not present

## 2015-02-05 DIAGNOSIS — Z79899 Other long term (current) drug therapy: Secondary | ICD-10-CM | POA: Insufficient documentation

## 2015-02-05 DIAGNOSIS — Z8673 Personal history of transient ischemic attack (TIA), and cerebral infarction without residual deficits: Secondary | ICD-10-CM | POA: Insufficient documentation

## 2015-02-05 DIAGNOSIS — I5033 Acute on chronic diastolic (congestive) heart failure: Secondary | ICD-10-CM | POA: Diagnosis not present

## 2015-02-05 DIAGNOSIS — I2510 Atherosclerotic heart disease of native coronary artery without angina pectoris: Secondary | ICD-10-CM | POA: Insufficient documentation

## 2015-02-05 DIAGNOSIS — Z87891 Personal history of nicotine dependence: Secondary | ICD-10-CM | POA: Insufficient documentation

## 2015-02-05 DIAGNOSIS — Z8659 Personal history of other mental and behavioral disorders: Secondary | ICD-10-CM | POA: Diagnosis not present

## 2015-02-05 DIAGNOSIS — Z7982 Long term (current) use of aspirin: Secondary | ICD-10-CM | POA: Diagnosis not present

## 2015-02-05 DIAGNOSIS — E559 Vitamin D deficiency, unspecified: Secondary | ICD-10-CM | POA: Insufficient documentation

## 2015-02-05 DIAGNOSIS — I129 Hypertensive chronic kidney disease with stage 1 through stage 4 chronic kidney disease, or unspecified chronic kidney disease: Secondary | ICD-10-CM | POA: Diagnosis not present

## 2015-02-05 DIAGNOSIS — M1990 Unspecified osteoarthritis, unspecified site: Secondary | ICD-10-CM | POA: Insufficient documentation

## 2015-02-05 DIAGNOSIS — R0602 Shortness of breath: Secondary | ICD-10-CM | POA: Diagnosis not present

## 2015-02-05 LAB — COMPREHENSIVE METABOLIC PANEL
ALT: 12 U/L — ABNORMAL LOW (ref 14–54)
ANION GAP: 6 (ref 5–15)
AST: 19 U/L (ref 15–41)
Albumin: 3.5 g/dL (ref 3.5–5.0)
Alkaline Phosphatase: 80 U/L (ref 38–126)
BUN: 34 mg/dL — AB (ref 6–20)
CALCIUM: 9 mg/dL (ref 8.9–10.3)
CO2: 21 mmol/L — AB (ref 22–32)
Chloride: 114 mmol/L — ABNORMAL HIGH (ref 101–111)
Creatinine, Ser: 1.24 mg/dL — ABNORMAL HIGH (ref 0.44–1.00)
GFR, EST AFRICAN AMERICAN: 45 mL/min — AB (ref >60)
GFR, EST NON AFRICAN AMERICAN: 39 mL/min — AB (ref >60)
GLUCOSE: 100 mg/dL — AB (ref 65–99)
Potassium: 3.7 mmol/L (ref 3.5–5.1)
Sodium: 141 mmol/L (ref 135–145)
Total Bilirubin: 0.4 mg/dL (ref 0.3–1.2)
Total Protein: 6.3 g/dL — ABNORMAL LOW (ref 6.5–8.1)

## 2015-02-05 LAB — CBC WITH DIFFERENTIAL/PLATELET
Basophils Absolute: 0 10*3/uL (ref 0.0–0.1)
Basophils Relative: 0 % (ref 0–1)
Eosinophils Absolute: 0 10*3/uL (ref 0.0–0.7)
Eosinophils Relative: 0 % (ref 0–5)
HCT: 27.7 % — ABNORMAL LOW (ref 36.0–46.0)
Hemoglobin: 9.1 g/dL — ABNORMAL LOW (ref 12.0–15.0)
LYMPHS ABS: 1.8 10*3/uL (ref 0.7–4.0)
Lymphocytes Relative: 45 % (ref 12–46)
MCH: 30.6 pg (ref 26.0–34.0)
MCHC: 32.9 g/dL (ref 30.0–36.0)
MCV: 93.3 fL (ref 78.0–100.0)
MONOS PCT: 8 % (ref 3–12)
Monocytes Absolute: 0.3 10*3/uL (ref 0.1–1.0)
NEUTROS ABS: 1.8 10*3/uL (ref 1.7–7.7)
Neutrophils Relative %: 47 % (ref 43–77)
Platelets: 124 10*3/uL — ABNORMAL LOW (ref 150–400)
RBC: 2.97 MIL/uL — ABNORMAL LOW (ref 3.87–5.11)
RDW: 13.3 % (ref 11.5–15.5)
WBC: 3.9 10*3/uL — ABNORMAL LOW (ref 4.0–10.5)

## 2015-02-05 LAB — URINALYSIS, ROUTINE W REFLEX MICROSCOPIC
Bilirubin Urine: NEGATIVE
Glucose, UA: NEGATIVE mg/dL
HGB URINE DIPSTICK: NEGATIVE
Ketones, ur: NEGATIVE mg/dL
LEUKOCYTES UA: NEGATIVE
NITRITE: NEGATIVE
Protein, ur: NEGATIVE mg/dL
SPECIFIC GRAVITY, URINE: 1.013 (ref 1.005–1.030)
UROBILINOGEN UA: 0.2 mg/dL (ref 0.0–1.0)
pH: 5.5 (ref 5.0–8.0)

## 2015-02-05 LAB — BRAIN NATRIURETIC PEPTIDE: B NATRIURETIC PEPTIDE 5: 195.5 pg/mL — AB (ref 0.0–100.0)

## 2015-02-05 LAB — TROPONIN I: Troponin I: <0.03 ng/mL (ref <0.031)

## 2015-02-05 MED ORDER — FUROSEMIDE 10 MG/ML IJ SOLN
40.0000 mg | Freq: Once | INTRAMUSCULAR | Status: AC
  Administered 2015-02-05: 40 mg via INTRAVENOUS
  Filled 2015-02-05: qty 4

## 2015-02-05 NOTE — Discharge Instructions (Signed)
You were seen today for shortness of breath. It appears she may be in mild heart failure. You were given a dose of Lasix. Your vital signs were otherwise reassuring. You should increase your dose of Lasix to 80 mg daily for 2 days and follow-up with your cardiologist on Monday.  Heart Failure Heart failure is a condition in which the heart has trouble pumping blood. This means your heart does not pump blood efficiently for your body to work well. In some cases of heart failure, fluid may back up into your lungs or you may have swelling (edema) in your lower legs. Heart failure is usually a long-term (chronic) condition. It is important for you to take good care of yourself and follow your health care provider's treatment plan. CAUSES  Some health conditions can cause heart failure. Those health conditions include:  High blood pressure (hypertension). Hypertension causes the heart muscle to work harder than normal. When pressure in the blood vessels is high, the heart needs to pump (contract) with more force in order to circulate blood throughout the body. High blood pressure eventually causes the heart to become stiff and weak.  Coronary artery disease (CAD). CAD is the buildup of cholesterol and fat (plaque) in the arteries of the heart. The blockage in the arteries deprives the heart muscle of oxygen and blood. This can cause chest pain and may lead to a heart attack. High blood pressure can also contribute to CAD.  Heart attack (myocardial infarction). A heart attack occurs when one or more arteries in the heart become blocked. The loss of oxygen damages the muscle tissue of the heart. When this happens, part of the heart muscle dies. The injured tissue does not contract as well and weakens the heart's ability to pump blood.  Abnormal heart valves. When the heart valves do not open and close properly, it can cause heart failure. This makes the heart muscle pump harder to keep the blood  flowing.  Heart muscle disease (cardiomyopathy or myocarditis). Heart muscle disease is damage to the heart muscle from a variety of causes. These can include drug or alcohol abuse, infections, or unknown reasons. These can increase the risk of heart failure.  Lung disease. Lung disease makes the heart work harder because the lungs do not work properly. This can cause a strain on the heart, leading it to fail.  Diabetes. Diabetes increases the risk of heart failure. High blood sugar contributes to high fat (lipid) levels in the blood. Diabetes can also cause slow damage to tiny blood vessels that carry important nutrients to the heart muscle. When the heart does not get enough oxygen and food, it can cause the heart to become weak and stiff. This leads to a heart that does not contract efficiently.  Other conditions can contribute to heart failure. These include abnormal heart rhythms, thyroid problems, and low blood counts (anemia). Certain unhealthy behaviors can increase the risk of heart failure, including:  Being overweight.  Smoking or chewing tobacco.  Eating foods high in fat and cholesterol.  Abusing illicit drugs or alcohol.  Lacking physical activity. SYMPTOMS  Heart failure symptoms may vary and can be hard to detect. Symptoms may include:  Shortness of breath with activity, such as climbing stairs.  Persistent cough.  Swelling of the feet, ankles, legs, or abdomen.  Unexplained weight gain.  Difficulty breathing when lying flat (orthopnea).  Waking from sleep because of the need to sit up and get more air.  Rapid heartbeat.  Fatigue and loss of energy.  Feeling light-headed, dizzy, or close to fainting.  Loss of appetite.  Nausea.  Increased urination during the night (nocturia). DIAGNOSIS  A diagnosis of heart failure is based on your history, symptoms, physical examination, and diagnostic tests. Diagnostic tests for heart failure may  include:  Echocardiography.  Electrocardiography.  Chest X-ray.  Blood tests.  Exercise stress test.  Cardiac angiography.  Radionuclide scans. TREATMENT  Treatment is aimed at managing the symptoms of heart failure. Medicines, behavioral changes, or surgical intervention may be necessary to treat heart failure.  Medicines to help treat heart failure may include:  Angiotensin-converting enzyme (ACE) inhibitors. This type of medicine blocks the effects of a blood protein called angiotensin-converting enzyme. ACE inhibitors relax (dilate) the blood vessels and help lower blood pressure.  Angiotensin receptor blockers (ARBs). This type of medicine blocks the actions of a blood protein called angiotensin. Angiotensin receptor blockers dilate the blood vessels and help lower blood pressure.  Water pills (diuretics). Diuretics cause the kidneys to remove salt and water from the blood. The extra fluid is removed through urination. This loss of extra fluid lowers the volume of blood the heart pumps.  Beta blockers. These prevent the heart from beating too fast and improve heart muscle strength.  Digitalis. This increases the force of the heartbeat.  Healthy behavior changes include:  Obtaining and maintaining a healthy weight.  Stopping smoking or chewing tobacco.  Eating heart-healthy foods.  Limiting or avoiding alcohol.  Stopping illicit drug use.  Physical activity as directed by your health care provider.  Surgical treatment for heart failure may include:  A procedure to open blocked arteries, repair damaged heart valves, or remove damaged heart muscle tissue.  A pacemaker to improve heart muscle function and control certain abnormal heart rhythms.  An internal cardioverter defibrillator to treat certain serious abnormal heart rhythms.  A left ventricular assist device (LVAD) to assist the pumping ability of the heart. HOME CARE INSTRUCTIONS   Take medicines only  as directed by your health care provider. Medicines are important in reducing the workload of your heart, slowing the progression of heart failure, and improving your symptoms.  Do not stop taking your medicine unless directed by your health care provider.  Do not skip any dose of medicine.  Refill your prescriptions before you run out of medicine. Your medicines are needed every day.  Engage in moderate physical activity if directed by your health care provider. Moderate physical activity can benefit some people. The elderly and people with severe heart failure should consult with a health care provider for physical activity recommendations.  Eat heart-healthy foods. Food choices should be free of trans fat and low in saturated fat, cholesterol, and salt (sodium). Healthy choices include fresh or frozen fruits and vegetables, fish, lean meats, legumes, fat-free or low-fat dairy products, and whole grain or high fiber foods. Talk to a dietitian to learn more about heart-healthy foods.  Limit sodium if directed by your health care provider. Sodium restriction may reduce symptoms of heart failure in some people. Talk to a dietitian to learn more about heart-healthy seasonings.  Use healthy cooking methods. Healthy cooking methods include roasting, grilling, broiling, baking, poaching, steaming, or stir-frying. Talk to a dietitian to learn more about healthy cooking methods.  Limit fluids if directed by your health care provider. Fluid restriction may reduce symptoms of heart failure in some people.  Weigh yourself every day. Daily weights are important in the early recognition of excess  fluid. You should weigh yourself every morning after you urinate and before you eat breakfast. Wear the same amount of clothing each time you weigh yourself. Record your daily weight. Provide your health care provider with your weight record.  Monitor and record your blood pressure if directed by your health care  provider.  Check your pulse if directed by your health care provider.  Lose weight if directed by your health care provider. Weight loss may reduce symptoms of heart failure in some people.  Stop smoking or chewing tobacco. Nicotine makes your heart work harder by causing your blood vessels to constrict. Do not use nicotine gum or patches before talking to your health care provider.  Keep all follow-up visits as directed by your health care provider. This is important.  Limit alcohol intake to no more than 1 drink per day for nonpregnant women and 2 drinks per day for men. One drink equals 12 ounces of beer, 5 ounces of wine, or 1 ounces of hard liquor. Drinking more than that is harmful to your heart. Tell your health care provider if you drink alcohol several times a week. Talk with your health care provider about whether alcohol is safe for you. If your heart has already been damaged by alcohol or you have severe heart failure, drinking alcohol should be stopped completely.  Stop illicit drug use.  Stay up-to-date with immunizations. It is especially important to prevent respiratory infections through current pneumococcal and influenza immunizations.  Manage other health conditions such as hypertension, diabetes, thyroid disease, or abnormal heart rhythms as directed by your health care provider.  Learn to manage stress.  Plan rest periods when fatigued.  Learn strategies to manage high temperatures. If the weather is extremely hot:  Avoid vigorous physical activity.  Use air conditioning or fans or seek a cooler location.  Avoid caffeine and alcohol.  Wear loose-fitting, lightweight, and light-colored clothing.  Learn strategies to manage cold temperatures. If the weather is extremely cold:  Avoid vigorous physical activity.  Layer clothes.  Wear mittens or gloves, a hat, and a scarf when going outside.  Avoid alcohol.  Obtain ongoing education and support as  needed.  Participate in or seek rehabilitation as needed to maintain or improve independence and quality of life. SEEK MEDICAL CARE IF:   Your weight increases by 03 lb/1.4 kg in 1 day or 05 lb/2.3 kg in a week.  You have increasing shortness of breath that is unusual for you.  You are unable to participate in your usual physical activities.  You tire easily.  You cough more than normal, especially with physical activity.  You have any or more swelling in areas such as your hands, feet, ankles, or abdomen.  You are unable to sleep because it is hard to breathe.  You feel like your heart is beating fast (palpitations).  You become dizzy or light-headed upon standing up. SEEK IMMEDIATE MEDICAL CARE IF:   You have difficulty breathing.  There is a change in mental status such as decreased alertness or difficulty with concentration.  You have a pain or discomfort in your chest.  You have an episode of fainting (syncope). MAKE SURE YOU:   Understand these instructions.  Will watch your condition.  Will get help right away if you are not doing well or get worse. Document Released: 06/17/2005 Document Revised: 11/01/2013 Document Reviewed: 07/17/2012 [facility] Patient Information 2015 [place], [place]. This information is not intended to replace advice given to you by your health  care provider. Make sure you discuss any questions you have with your health care provider.

## 2015-02-05 NOTE — ED Notes (Signed)
Patient transported to X-ray 

## 2015-02-05 NOTE — ED Notes (Signed)
Pt ambulated down the hall with assistance. spo2 maintained at 99-100% on room air.

## 2015-02-05 NOTE — ED Notes (Signed)
Notified [facility] for transportation back home 

## 2015-02-05 NOTE — ED Provider Notes (Signed)
CSN: [id number]     Arrival date & time 02/05/15  0253 History  This chart was scribed for Jane Hacker, MD by Irene Pap, ED Scribe. This patient was seen in room B19C/B19C and patient care was started at 1:49 PM.   Chief Complaint  Patient presents with  . Shortness of Breath   The history is provided by the patient. No language interpreter was used.   HPI Comments: Jane [name] is a 81 y.o. female with hx of anxiety, HTN, CAD and COPD who presents to the Emergency Department brought in by EMS complaining of recurrent SOB onset 3 hours ago. Reports that she got up to go to the bathroom when she felt shortness of breath. States that she called EMS and states that they "did something to her," but cannot recall what they did. Was given albuterol by EMS. States that she does not currently feel SOB but does not know if she feels better, worse, or the same as she did when she was experiencing the SOB. Denies cough, fever, LE swelling.  NO recent medication changes.  Past Medical History  Diagnosis Date  . Anemia   . Anxiety   . Arthritis   . GERD (gastroesophageal reflux disease) 2009    EGD with benign gastric polyp too  . Hyperlipidemia   . Hypertension   . Vitamin D deficiency   . Colon polyp     2009 colonoscopy, not retrieved for pathology  . Hiatal hernia   . Diverticulosis 2009  . Aortic insufficiency     mild  . Stroke 1975  . CKD (chronic kidney disease) stage 3, GFR 30-59 ml/min   . CHF (congestive heart failure)   . CAD (coronary artery disease)     cath 08/09/2014 95% stenosis in prox to mid RCA s/p DES, 80-90% prox OM2, 50% distal LAD   Past Surgical History  Procedure Laterality Date  . Appendectomy    . Abdominal hysterectomy    . Artery biopsy Left 12/16/2012    Procedure: BIOPSY TEMPORAL ARTERY;  Surgeon: Mal Misty, MD;  Location: [place];  Service: Vascular;  Laterality: Left;  . Left heart catheterization with coronary angiogram N/A 08/09/2014     Procedure: LEFT HEART CATHETERIZATION WITH CORONARY ANGIOGRAM;  Surgeon: Peter M Martinique, MD;  Location: [facility] CATH LAB;  Service: Cardiovascular;  Laterality: N/A;  . Percutaneous coronary stent intervention (pci-s)  08/09/2014    Procedure: PERCUTANEOUS CORONARY STENT INTERVENTION (PCI-S);  Surgeon: Peter M Martinique, MD;  Location: [facility] CATH LAB;  Service: Cardiovascular;;   Family History  Problem Relation Age of Onset  . Stroke Mother   . Hypertension Mother   . Stroke Father   . Hypertension Father   . Diabetes Sister    History  Substance Use Topics  . Smoking status: Former Smoker    Types: Cigarettes    Quit date: 07/02/1979  . Smokeless tobacco: Former Systems developer  . Alcohol Use: No   OB History    No data available     Review of Systems  Constitutional: Negative for fever.  Respiratory: Positive for shortness of breath. Negative for cough.   Cardiovascular: Negative for chest pain and leg swelling.  Gastrointestinal: Negative for nausea and vomiting.  Genitourinary: Negative for dysuria.  Skin: Negative for wound.  Neurological: Negative for dizziness and light-headedness.  All other systems reviewed and are negative.  Allergies  Review of patient's allergies indicates no known allergies.  Home Medications   Prior to  Admission medications   Medication Sig Start Date End Date Taking? Authorizing Provider  acetaminophen (TYLENOL) 325 MG tablet Take 650 mg by mouth every 6 (six) hours as needed for mild pain.    Historical Provider, MD  albuterol (PROVENTIL HFA;VENTOLIN HFA) 108 (90 BASE) MCG/ACT inhaler Inhale 2 puffs into the lungs every 4 (four) hours as needed for wheezing or shortness of breath. 08/05/14   Luan Moore, MD  aspirin EC 81 MG tablet Take 81 mg by mouth daily.    Historical Provider, MD  atorvastatin (LIPITOR) 20 MG tablet Take 1 tablet (20 mg total) by mouth daily at 6 PM. 08/10/14   Francesca Oman, DO  bisoprolol (ZEBETA) 5 MG tablet Take 0.5 tablets (2.5 mg total)  by mouth daily. 10/17/14   Tasrif Ahmed, MD  cyanocobalamin (,VITAMIN B-12,) 1000 MCG/ML injection Inject 1 mL (1,000 mcg total) into the muscle every 30 (thirty) days. 12/08/14   Milagros Loll, MD  furosemide (LASIX) 40 MG tablet Take 1 tablet (40 mg total) by mouth daily. 10/17/14   Dellia Nims, MD  hydroxypropyl methylcellulose / hypromellose (ISOPTO TEARS / GONIOVISC) 2.5 % ophthalmic solution Place 1 drop into the left eye 4 (four) times daily as needed for dry eyes. 04/12/14   Noland Fordyce, PA-C  isosorbide mononitrate (IMDUR) 30 MG 24 hr tablet Take 1 tablet (30 mg total) by mouth daily. 08/10/14   Francesca Oman, DO  isosorbide mononitrate (IMDUR) 30 MG 24 hr tablet take 1 tablet by mouth once daily 01/02/15   Milagros Loll, MD  losartan (COZAAR) 50 MG tablet Take 1 tablet (50 mg total) by mouth daily. 12/08/14   Milagros Loll, MD  omeprazole (PRILOSEC) 20 MG capsule Take 1 capsule (20 mg total) by mouth 2 (two) times daily before a meal. 04/12/14   Noland Fordyce, PA-C  pantoprazole (PROTONIX) 20 MG tablet Take 1 tablet (20 mg total) by mouth daily. 12/07/14   Jane Hacker, MD  potassium chloride SA (K-DUR,KLOR-CON) 20 MEQ tablet take 1 tablet by mouth once daily 08/03/14   Luan Moore, MD  ticagrelor (BRILINTA) 90 MG TABS tablet Take 1 tablet (90 mg total) by mouth 2 (two) times daily. 09/29/14   Francesca Oman, DO  Vitamin D, Cholecalciferol, 1000 UNITS CAPS Take 1 capsule by mouth daily. 12/08/14   Milagros Loll, MD   BP 125/46 mmHg  Pulse 69  Temp(Src) 97.9 F (36.6 C) (Oral)  Resp 20  Ht 5\' 4"  (1.626 m)  Wt 160 lb (72.576 kg)  BMI 27.45 kg/m2  SpO2 100% Physical Exam  Constitutional: She is oriented to person, place, and time.  Chronically ill-appearing  HENT:  Head: Normocephalic and atraumatic.  Cardiovascular: Normal rate, regular rhythm and normal heart sounds.   No murmur heard. DP pulses intact  Pulmonary/Chest: Effort normal and breath sounds normal. No respiratory  distress. She has no wheezes.  Fine crackles at the bases  Abdominal: Soft. Bowel sounds are normal.  Musculoskeletal:  No calf tenderness  Neurological: She is alert and oriented to person, place, and time.  Skin: Skin is warm and dry.  Psychiatric: She has a normal mood and affect.  Nursing note and vitals reviewed.   ED Course  Procedures (including critical care time) DIAGNOSTIC STUDIES: Oxygen Saturation is 100% on RA, normal by my interpretation.    COORDINATION OF CARE: 1:49 PM-Discussed treatment plan which includes labs with pt at bedside and pt agreed to plan.   Labs  Review Labs Reviewed  CBC WITH DIFFERENTIAL/PLATELET - Abnormal; Notable for the following:    WBC 3.9 (*)    RBC 2.97 (*)    Hemoglobin 9.1 (*)    HCT 27.7 (*)    Platelets 124 (*)    All other components within normal limits  COMPREHENSIVE METABOLIC PANEL - Abnormal; Notable for the following:    Chloride 114 (*)    CO2 21 (*)    Glucose, Bld 100 (*)    BUN 34 (*)    Creatinine, Ser 1.24 (*)    Total Protein 6.3 (*)    ALT 12 (*)    GFR calc non Af Amer 39 (*)    GFR calc Af Amer 45 (*)    All other components within normal limits  BRAIN NATRIURETIC PEPTIDE - Abnormal; Notable for the following:    B Natriuretic Peptide 195.5 (*)    All other components within normal limits  URINALYSIS, ROUTINE W REFLEX MICROSCOPIC (NOT AT [facility]) - Abnormal; Notable for the following:    APPearance CLOUDY (*)    All other components within normal limits  TROPONIN I    Imaging Review Dg Chest 2 View  02/05/2015   CLINICAL DATA:  Shortness of breath. History of hypertension, hyperlipidemia, CHF.  EXAM: CHEST  2 VIEW  COMPARISON:  Chest radiograph December 07, 2014  FINDINGS: The cardiac silhouette is moderately enlarged, unchanged from prior examination. Calcified aortic knob. Diffuse mild interstitial prominence, minimal pulmonary vascular congestion. Elevated RIGHT hemidiaphragm with RIGHT lung base strandy  densities. No pleural effusion or focal consolidation. No pneumothorax. Patient is osteopenic. Soft tissue planes are nonsuspicious.  IMPRESSION: Stable cardiomegaly. Diffuse interstitial prominence suggests pulmonary edema or less likely atypical infection without focal consolidation.   Electronically Signed   By: Elon Alas M.D.   On: 02/05/2015 04:14     EKG Interpretation   Date/Time:  Sunday February 05 2015 03:07:03 EDT Ventricular Rate:  70 PR Interval:  145 QRS Duration: 97 QT Interval:  425 QTC Calculation: 459 R Axis:   3 Text Interpretation:  Sinus rhythm Probable left atrial enlargement Low  voltage, precordial leads Abnormal R-wave progression, early transition No  significant change since last tracing Confirmed by Wynn Kernes  MD, [place]  ([id number]) on 02/05/2015 3:51:09 AM      MDM   Final diagnoses:  Acute on chronic diastolic heart failure    Patient presents with SOB on exertion.  Nontoxic.  Satting 99%  On RA.  NO infectious symptoms.  EKG reassuring.  CHest xray with likely mild pulmonary edema.  No significant elevation in BNP. Other labwork is baseline.  Patient given 40 mg IV lasix.    Ambulated and maintained pulse ox.  Discussed with patient 2 day increase of lasix to 80 mg daily and follow-up with cardiology.  After history, exam, and medical workup I feel the patient has been appropriately medically screened and is safe for discharge home. Pertinent diagnoses were discussed with the patient. Patient was given return precautions.  I personally performed the services described in this documentation, which was scribed in my presence. The recorded information has been reviewed and is accurate.    Jane Hacker, MD 02/05/15 [phone]

## 2015-02-05 NOTE — ED Notes (Signed)
Pt is in stable condition upon d/c and is escorted home via [place].

## 2015-02-05 NOTE — ED Notes (Signed)
Called [facility]tar to transport pt back home

## 2015-02-05 NOTE — ED Notes (Signed)
Pt arrives from home via [facility] c/o SOB, denies CP, dizziness, lightheadedness, N/V.  Pt denies cough.  Pt able to speak in full sentences, resp e/u, 100% RA.

## 2015-02-08 ENCOUNTER — Ambulatory Visit: Payer: Medicare Other | Admitting: Internal Medicine

## 2015-02-08 ENCOUNTER — Ambulatory Visit (INDEPENDENT_AMBULATORY_CARE_PROVIDER_SITE_OTHER): Payer: Medicare Other | Admitting: Internal Medicine

## 2015-02-08 ENCOUNTER — Encounter: Payer: Self-pay | Admitting: Internal Medicine

## 2015-02-08 VITALS — BP 107/49 | HR 71 | Temp 97.4°F | Wt 162.1 lb

## 2015-02-08 DIAGNOSIS — I509 Heart failure, unspecified: Secondary | ICD-10-CM | POA: Diagnosis not present

## 2015-02-08 DIAGNOSIS — K219 Gastro-esophageal reflux disease without esophagitis: Secondary | ICD-10-CM | POA: Diagnosis not present

## 2015-02-08 DIAGNOSIS — R748 Abnormal levels of other serum enzymes: Secondary | ICD-10-CM | POA: Diagnosis not present

## 2015-02-08 DIAGNOSIS — I5030 Unspecified diastolic (congestive) heart failure: Secondary | ICD-10-CM

## 2015-02-08 DIAGNOSIS — I5032 Chronic diastolic (congestive) heart failure: Secondary | ICD-10-CM | POA: Diagnosis not present

## 2015-02-08 DIAGNOSIS — E538 Deficiency of other specified B group vitamins: Secondary | ICD-10-CM | POA: Diagnosis not present

## 2015-02-08 DIAGNOSIS — I110 Hypertensive heart disease with heart failure: Secondary | ICD-10-CM | POA: Diagnosis not present

## 2015-02-08 DIAGNOSIS — R7989 Other specified abnormal findings of blood chemistry: Secondary | ICD-10-CM

## 2015-02-08 MED ORDER — PANTOPRAZOLE SODIUM 20 MG PO TBEC: 20.0000 mg | DELAYED_RELEASE_TABLET | Freq: Every day | ORAL | Status: DC

## 2015-02-08 MED ORDER — CYANOCOBALAMIN 1000 MCG/ML IJ SOLN
1000.0000 ug | Freq: Once | INTRAMUSCULAR | Status: AC
  Administered 2015-02-08: 1000 ug via INTRAMUSCULAR

## 2015-02-08 NOTE — Patient Instructions (Addendum)
Jane [name] it was nice meeting you today. I want to remind you that you were given your B12 injection today. In addition, I refilled your prescription for Pantoprazole. You have a follow up appointment with Dr. Randell Patient on 03/02/2015.   Please continue taking: Furosemide 40 mg 1 tablet by mouth everyday Potassium Chloride 20 meq 1 tablet by mouth everyday  Continue taking all your other medications as prescribed.

## 2015-02-09 LAB — BASIC METABOLIC PANEL
BUN / CREAT RATIO: 30 — AB (ref 11–26)
BUN: 37 mg/dL — ABNORMAL HIGH (ref 8–27)
CHLORIDE: 105 mmol/L (ref 97–108)
CO2: 19 mmol/L (ref 18–29)
Calcium: 9 mg/dL (ref 8.7–10.3)
Creatinine, Ser: 1.23 mg/dL — ABNORMAL HIGH (ref 0.57–1.00)
GFR calc Af Amer: 47 mL/min/{1.73_m2} — ABNORMAL LOW (ref >59)
GFR calc non Af Amer: 40 mL/min/{1.73_m2} — ABNORMAL LOW (ref >59)
GLUCOSE: 91 mg/dL (ref 65–99)
POTASSIUM: 3.8 mmol/L (ref 3.5–5.2)
Sodium: 142 mmol/L (ref 134–144)

## 2015-02-09 NOTE — Assessment & Plan Note (Addendum)
Patient went to the ED on 02/15/15 and was diagnosed with acute on chronic dCHF. She was given IV Lasix 40 mg and advised to increase her oral lasix to 80 mg daily for 2 days and follow up with cardiology. Denies SOB when ambulating at home. Denies cough, CP, or leg swelling. States she now back to her regular schedule for Furosemide which is 40 mg QD. However, after the patient left I spoke to Dr. Maudie Mercury. She called the pts pharmacy and found out that from July 2015 to April 2016  patient had only picked her Furosemide 20 mg prescription (30 day supply) a total of 4 times. She was skipping months and not getting it filled consistently. The last refill was in April 2016 for a 90 day supply of Furosemide 40 mg that should have lasted her until July 18. However, patient still had pills left in her bottle. This shows that non-compliance could have let to her CHF exacerbation this time. -Cont Furosemide 40 mg 1 tab qd because patient is asymptomatic at present -Cont KCl 20 meq 1 tab qd -F/u BMP, pts creatinine was elevated during her ED visit.  -Please discuss the importance of compliance with the patient at her next visit. She is scheduled to see Dr. Randell Patient on 9/1.  -BP stable today 107/49. Continue current management with Furosemide 40 mg qd, Bisoprolol 2.5 mg qd, Losartan 50 mg qd, and Isosorbide mononitrate 30 mg qd.

## 2015-02-09 NOTE — Progress Notes (Signed)
Internal Medicine Clinic Attending  I saw and evaluated the patient.  I personally confirmed the key portions of the history and exam documented by Dr. Marlowe Sax and I reviewed pertinent patient test results.  The assessment, diagnosis, and plan were formulated together and I agree with the documentation in the resident's note. Ms Capes is speaking in full sentences. LCTAB.

## 2015-02-09 NOTE — Assessment & Plan Note (Signed)
Well controlled: not complaining of any symptoms at present. -Pantoprazole 20 mg qd refilled today

## 2015-02-09 NOTE — Assessment & Plan Note (Signed)
Patient brought her B12 vial from home and was given an injection today.

## 2015-02-09 NOTE — Progress Notes (Signed)
Subjective:   Patient ID: ARIAN MCQUITTY female   DOB: 02/10/1929 81 y.o.   MRN: [id number]  HPI: Ms.Eiko INOLA LISLE is a 81 y.o. F with a past medical history of anemia, GERD, hyperlipidemia, stroke, CAD, chronic diastolic CHF, COPD, CKD stage III, and hypertension presenting for a recent ED visit follow-up. Patient went to the ED on 02/15/15 and was diagnosed with acute on chronic dCHF. She was given IV Lasix 40 mg and advised to increase her oral lasix to 80 mg daily for 2 days and follow up with cardiology. Denies SOB when ambulating at home. Denies cough, CP, or leg swelling. States she now back to her regular schedule for Furosemide which is 40 mg QD.     Past Medical History  Diagnosis Date  . Anemia   . Anxiety   . Arthritis   . GERD (gastroesophageal reflux disease) 2009    EGD with benign gastric polyp too  . Hyperlipidemia   . Hypertension   . Vitamin D deficiency   . Colon polyp     2009 colonoscopy, not retrieved for pathology  . Hiatal hernia   . Diverticulosis 2009  . Aortic insufficiency     mild  . Stroke 1975  . CKD (chronic kidney disease) stage 3, GFR 30-59 ml/min   . CHF (congestive heart failure)   . CAD (coronary artery disease)     cath 08/09/2014 95% stenosis in prox to mid RCA s/p DES, 80-90% prox OM2, 50% distal LAD   Current Outpatient Prescriptions  Medication Sig Dispense Refill  . acetaminophen (TYLENOL) 325 MG tablet Take 650 mg by mouth every 6 (six) hours as needed for mild pain.    Marland Kitchen albuterol (PROVENTIL HFA;VENTOLIN HFA) 108 (90 BASE) MCG/ACT inhaler Inhale 2 puffs into the lungs every 4 (four) hours as needed for wheezing or shortness of breath. 1 Inhaler 3  . aspirin EC 81 MG tablet Take 81 mg by mouth daily.    Marland Kitchen atorvastatin (LIPITOR) 20 MG tablet Take 1 tablet (20 mg total) by mouth daily at 6 PM. 30 tablet 3  . bisoprolol (ZEBETA) 5 MG tablet Take 0.5 tablets (2.5 mg total) by mouth daily. 90 tablet 1  . cyanocobalamin (,VITAMIN B-12,)  1000 MCG/ML injection Inject 1 mL (1,000 mcg total) into the muscle every 30 (thirty) days. 1 mL 0  . furosemide (LASIX) 40 MG tablet Take 1 tablet (40 mg total) by mouth daily. 90 tablet 3  . hydroxypropyl methylcellulose / hypromellose (ISOPTO TEARS / GONIOVISC) 2.5 % ophthalmic solution Place 1 drop into the left eye 4 (four) times daily as needed for dry eyes. 15 mL 2  . isosorbide mononitrate (IMDUR) 30 MG 24 hr tablet Take 1 tablet (30 mg total) by mouth daily. 30 tablet 1  . isosorbide mononitrate (IMDUR) 30 MG 24 hr tablet take 1 tablet by mouth once daily 30 tablet 3  . losartan (COZAAR) 50 MG tablet Take 1 tablet (50 mg total) by mouth daily. 30 tablet 3  . pantoprazole (PROTONIX) 20 MG tablet Take 1 tablet (20 mg total) by mouth daily. 30 tablet 2  . potassium chloride SA (K-DUR,KLOR-CON) 20 MEQ tablet take 1 tablet by mouth once daily 90 tablet 3  . ticagrelor (BRILINTA) 90 MG TABS tablet Take 1 tablet (90 mg total) by mouth 2 (two) times daily. 60 tablet 1  . Vitamin D, Cholecalciferol, 1000 UNITS CAPS Take 1 capsule by mouth daily. 30 capsule 3   No current  facility-administered medications for this visit.   Family History  Problem Relation Age of Onset  . Stroke Mother   . Hypertension Mother   . Stroke Father   . Hypertension Father   . Diabetes Sister    Social History   Social History  . Marital Status: Widowed    Spouse Name: N/A  . Number of Children: N/A  . Years of Education: N/A   Social History Main Topics  . Smoking status: Former Smoker    Types: Cigarettes    Quit date: 07/02/1979  . Smokeless tobacco: Former Systems developer  . Alcohol Use: No  . Drug Use: No  . Sexual Activity: No   Other Topics Concern  . None   Social History Narrative   Review of Systems: Review of Systems  Constitutional: Negative for fever, chills and malaise/fatigue.  HENT: Negative for ear discharge.   Eyes: Negative for blurred vision and pain.  Respiratory: Negative for cough,  sputum production and shortness of breath.   Cardiovascular: Negative for chest pain and leg swelling.  Gastrointestinal: Negative for nausea, vomiting, abdominal pain, diarrhea and constipation.  Skin: Negative for itching and rash.  Neurological: Negative for headaches.     Objective:  Physical Exam: Filed Vitals:   02/08/15 1022  BP: 107/49  Pulse: 71  Temp: 97.4 F (36.3 C)  TempSrc: Oral  Weight: 162 lb 1.6 oz (73.528 kg)  SpO2: 100%   Physical Exam  Constitutional: She is oriented to person, place, and time. She appears well-developed and well-nourished. No distress.  HENT:  Head: Normocephalic and atraumatic.  Eyes: EOM are normal. Pupils are equal, round, and reactive to light.  Neck: Neck supple. No tracheal deviation present.  Cardiovascular: Normal rate, regular rhythm and intact distal pulses.   Pulmonary/Chest: Effort normal and breath sounds normal. No respiratory distress. She has no wheezes. She has no rales.  Abdominal: Soft. Bowel sounds are normal. She exhibits no distension. There is no tenderness.  Musculoskeletal: She exhibits no edema.  Neurological: She is alert and oriented to person, place, and time.  Skin: Skin is warm and dry.  Psychiatric: She has a normal mood and affect.     Assessment & Plan:

## 2015-03-02 ENCOUNTER — Encounter: Payer: Medicare Other | Admitting: Pulmonary Disease

## 2015-03-29 ENCOUNTER — Emergency Department (HOSPITAL_COMMUNITY): Payer: Medicare Other

## 2015-03-29 ENCOUNTER — Encounter (HOSPITAL_COMMUNITY): Payer: Self-pay | Admitting: Emergency Medicine

## 2015-03-29 ENCOUNTER — Emergency Department (HOSPITAL_COMMUNITY)
Admission: EM | Admit: 2015-03-29 | Discharge: 2015-03-29 | Disposition: A | Discharge status: Home / Self Care | Payer: Medicare Other | Attending: Emergency Medicine | Admitting: Emergency Medicine

## 2015-03-29 DIAGNOSIS — R0602 Shortness of breath: Secondary | ICD-10-CM | POA: Diagnosis not present

## 2015-03-29 DIAGNOSIS — Z7982 Long term (current) use of aspirin: Secondary | ICD-10-CM | POA: Diagnosis not present

## 2015-03-29 DIAGNOSIS — Z86010 Personal history of colonic polyps: Secondary | ICD-10-CM | POA: Insufficient documentation

## 2015-03-29 DIAGNOSIS — Z7902 Long term (current) use of antithrombotics/antiplatelets: Secondary | ICD-10-CM | POA: Insufficient documentation

## 2015-03-29 DIAGNOSIS — M1990 Unspecified osteoarthritis, unspecified site: Secondary | ICD-10-CM | POA: Diagnosis not present

## 2015-03-29 DIAGNOSIS — Z8673 Personal history of transient ischemic attack (TIA), and cerebral infarction without residual deficits: Secondary | ICD-10-CM | POA: Diagnosis not present

## 2015-03-29 DIAGNOSIS — I130 Hypertensive heart and chronic kidney disease with heart failure and stage 1 through stage 4 chronic kidney disease, or unspecified chronic kidney disease: Secondary | ICD-10-CM | POA: Insufficient documentation

## 2015-03-29 DIAGNOSIS — N183 Chronic kidney disease, stage 3 (moderate): Secondary | ICD-10-CM | POA: Insufficient documentation

## 2015-03-29 DIAGNOSIS — R0600 Dyspnea, unspecified: Secondary | ICD-10-CM | POA: Insufficient documentation

## 2015-03-29 DIAGNOSIS — I2510 Atherosclerotic heart disease of native coronary artery without angina pectoris: Secondary | ICD-10-CM | POA: Diagnosis not present

## 2015-03-29 DIAGNOSIS — K219 Gastro-esophageal reflux disease without esophagitis: Secondary | ICD-10-CM | POA: Insufficient documentation

## 2015-03-29 DIAGNOSIS — Z79899 Other long term (current) drug therapy: Secondary | ICD-10-CM | POA: Insufficient documentation

## 2015-03-29 DIAGNOSIS — E785 Hyperlipidemia, unspecified: Secondary | ICD-10-CM | POA: Diagnosis not present

## 2015-03-29 DIAGNOSIS — J449 Chronic obstructive pulmonary disease, unspecified: Secondary | ICD-10-CM | POA: Diagnosis not present

## 2015-03-29 DIAGNOSIS — Z8659 Personal history of other mental and behavioral disorders: Secondary | ICD-10-CM | POA: Insufficient documentation

## 2015-03-29 DIAGNOSIS — I509 Heart failure, unspecified: Secondary | ICD-10-CM | POA: Insufficient documentation

## 2015-03-29 DIAGNOSIS — Z87891 Personal history of nicotine dependence: Secondary | ICD-10-CM | POA: Diagnosis not present

## 2015-03-29 LAB — CBC WITH DIFFERENTIAL/PLATELET
BASOS PCT: 0 %
Basophils Absolute: 0 10*3/uL (ref 0.0–0.1)
Eosinophils Absolute: 0 10*3/uL (ref 0.0–0.7)
Eosinophils Relative: 0 %
HEMATOCRIT: 29.2 % — AB (ref 36.0–46.0)
Hemoglobin: 9.6 g/dL — ABNORMAL LOW (ref 12.0–15.0)
Lymphocytes Relative: 37 %
Lymphs Abs: 1.7 10*3/uL (ref 0.7–4.0)
MCH: 30.8 pg (ref 26.0–34.0)
MCHC: 32.9 g/dL (ref 30.0–36.0)
MCV: 93.6 fL (ref 78.0–100.0)
MONO ABS: 0.4 10*3/uL (ref 0.1–1.0)
MONOS PCT: 8 %
NEUTROS ABS: 2.5 10*3/uL (ref 1.7–7.7)
NEUTROS PCT: 55 %
Platelets: 150 10*3/uL (ref 150–400)
RBC: 3.12 MIL/uL — ABNORMAL LOW (ref 3.87–5.11)
RDW: 14 % (ref 11.5–15.5)
WBC: 4.6 10*3/uL (ref 4.0–10.5)

## 2015-03-29 LAB — BASIC METABOLIC PANEL
Anion gap: 9 (ref 5–15)
BUN: 32 mg/dL — AB (ref 6–20)
CALCIUM: 9 mg/dL (ref 8.9–10.3)
CO2: 22 mmol/L (ref 22–32)
CREATININE: 1.49 mg/dL — AB (ref 0.44–1.00)
Chloride: 110 mmol/L (ref 101–111)
GFR, EST AFRICAN AMERICAN: 36 mL/min — AB (ref >60)
GFR, EST NON AFRICAN AMERICAN: 31 mL/min — AB (ref >60)
Glucose, Bld: 119 mg/dL — ABNORMAL HIGH (ref 65–99)
Potassium: 3.3 mmol/L — ABNORMAL LOW (ref 3.5–5.1)
Sodium: 141 mmol/L (ref 135–145)

## 2015-03-29 LAB — BRAIN NATRIURETIC PEPTIDE: B NATRIURETIC PEPTIDE 5: 107 pg/mL — AB (ref 0.0–100.0)

## 2015-03-29 LAB — PROTIME-INR
INR: 1.11 (ref 0.00–1.49)
PROTHROMBIN TIME: 14.5 s (ref 11.6–15.2)

## 2015-03-29 LAB — I-STAT TROPONIN, ED: Troponin i, poc: 0 ng/mL (ref 0.00–0.08)

## 2015-03-29 NOTE — ED Notes (Signed)
Pt arrives via EMS from home with mild SOB, states exertionally. EMS reports this is typical/chronic for patient. Alert/oriented per normal Pt breathing comfortably and easily in triage.

## 2015-03-29 NOTE — Discharge Instructions (Signed)
Shortness of Breath Jane [name], go to your appointment today at 3 PM for close follow-up. For any worsening symptoms come back to the emergency department immediately. Thank you. Shortness of breath means you have trouble breathing. Shortness of breath needs medical care right away. HOME CARE   Do not smoke.  Avoid being around chemicals or things (paint fumes, dust) that may bother your breathing.  Rest as needed. Slowly begin your normal activities.  Only take medicines as told by your doctor.  Keep all doctor visits as told. GET HELP RIGHT AWAY IF:   Your shortness of breath gets worse.  You feel lightheaded, pass out (faint), or have a cough that is not helped by medicine.  You cough up blood.  You have pain with breathing.  You have pain in your chest, arms, shoulders, or belly (abdomen).  You have a fever.  You cannot walk up stairs or exercise the way you normally do.  You do not get better in the time expected.  You have a hard time doing normal activities even with rest.  You have problems with your medicines.  You have any new symptoms. MAKE SURE YOU:  Understand these instructions.  Will watch your condition.  Will get help right away if you are not doing well or get worse. Document Released: 12/04/2007 Document Revised: 06/22/2013 Document Reviewed: 09/02/2011 [facility] Patient Information 2015 [place], [place]. This information is not intended to replace advice given to you by your health care provider. Make sure you discuss any questions you have with your health care provider.

## 2015-03-29 NOTE — ED Provider Notes (Signed)
CSN: [id number]     Arrival date & time 03/29/15  6294 History   By signing my name below, I, Jane [name], attest that this documentation has been prepared under the direction and in the presence of Jane Balls, MD. Electronically Signed: Forrestine [name], ED Scribe. 03/29/2015. 3:52 AM.   Chief Complaint  Patient presents with  . Shortness of Breath   The history is provided by the patient. No language interpreter was used.    HPI Comments: Jane [name] brought in by EMS from home is a 81 y.o. female with a PMHx of HTN, CHF, CAD, hyperlipidemia, and CKD who presents to the Emergency Department complaining of constant, ongoing shortness of breath onset prior to arrival while walking to the bathroom. Shortness of breath is made worse with exertion. She denies any fever, chills, nausea, vomiting, abdominal pain, or chest pain. Denies any history of same. Jane [name] has an appointment with her PCP at 3:00 PM this afternoon. No known allergies to medications.  Past Medical History  Diagnosis Date  . Anemia   . Anxiety   . Arthritis   . GERD (gastroesophageal reflux disease) 2009    EGD with benign gastric polyp too  . Hyperlipidemia   . Hypertension   . Vitamin D deficiency   . Colon polyp     2009 colonoscopy, not retrieved for pathology  . Hiatal hernia   . Diverticulosis 2009  . Aortic insufficiency     mild  . Stroke 1975  . CKD (chronic kidney disease) stage 3, GFR 30-59 ml/min   . CHF (congestive heart failure)   . CAD (coronary artery disease)     cath 08/09/2014 95% stenosis in prox to mid RCA s/p DES, 80-90% prox OM2, 50% distal LAD   Past Surgical History  Procedure Laterality Date  . Appendectomy    . Abdominal hysterectomy    . Artery biopsy Left 12/16/2012    Procedure: BIOPSY TEMPORAL ARTERY;  Surgeon: Mal Misty, MD;  Location: [facility];  Service: Vascular;  Laterality: Left;  . Left heart catheterization with coronary angiogram N/A 08/09/2014    Procedure: LEFT  HEART CATHETERIZATION WITH CORONARY ANGIOGRAM;  Surgeon: Peter M Martinique, MD;  Location: [facility] CATH LAB;  Service: Cardiovascular;  Laterality: N/A;  . Percutaneous coronary stent intervention (pci-s)  08/09/2014    Procedure: PERCUTANEOUS CORONARY STENT INTERVENTION (PCI-S);  Surgeon: Peter M Martinique, MD;  Location: [facility] CATH LAB;  Service: Cardiovascular;;   Family History  Problem Relation Age of Onset  . Stroke Mother   . Hypertension Mother   . Stroke Father   . Hypertension Father   . Diabetes Sister    Social History  Substance Use Topics  . Smoking status: Former Smoker    Types: Cigarettes    Quit date: 07/02/1979  . Smokeless tobacco: Former Systems developer  . Alcohol Use: No   OB History    No data available     Review of Systems  Constitutional: Negative for fever and chills.  Respiratory: Positive for shortness of breath. Negative for cough.   Cardiovascular: Negative for chest pain.  Gastrointestinal: Negative for nausea, vomiting, abdominal pain and diarrhea.  Genitourinary: Negative for dysuria.  Musculoskeletal: Negative for back pain.  Skin: Negative for rash.  Neurological: Negative for headaches.  Psychiatric/Behavioral: Negative for confusion.  All other systems reviewed and are negative.     Allergies  Review of patient's allergies indicates no known allergies.  Home Medications   Prior to Admission  medications   Medication Sig Start Date End Date Taking? Authorizing Provider  acetaminophen (TYLENOL) 325 MG tablet Take 650 mg by mouth every 6 (six) hours as needed for mild pain.    Historical Provider, MD  albuterol (PROVENTIL HFA;VENTOLIN HFA) 108 (90 BASE) MCG/ACT inhaler Inhale 2 puffs into the lungs every 4 (four) hours as needed for wheezing or shortness of breath. 08/05/14   Luan Moore, MD  aspirin EC 81 MG tablet Take 81 mg by mouth daily.    Historical Provider, MD  atorvastatin (LIPITOR) 20 MG tablet Take 1 tablet (20 mg total) by mouth daily at 6 PM.  08/10/14   Francesca Oman, DO  bisoprolol (ZEBETA) 5 MG tablet Take 0.5 tablets (2.5 mg total) by mouth daily. 10/17/14   Tasrif Ahmed, MD  cyanocobalamin (,VITAMIN B-12,) 1000 MCG/ML injection Inject 1 mL (1,000 mcg total) into the muscle every 30 (thirty) days. 12/08/14   Milagros Loll, MD  furosemide (LASIX) 40 MG tablet Take 1 tablet (40 mg total) by mouth daily. 10/17/14   Dellia Nims, MD  hydroxypropyl methylcellulose / hypromellose (ISOPTO TEARS / GONIOVISC) 2.5 % ophthalmic solution Place 1 drop into the left eye 4 (four) times daily as needed for dry eyes. 04/12/14   Noland Fordyce, PA-C  isosorbide mononitrate (IMDUR) 30 MG 24 hr tablet Take 1 tablet (30 mg total) by mouth daily. 08/10/14   Francesca Oman, DO  isosorbide mononitrate (IMDUR) 30 MG 24 hr tablet take 1 tablet by mouth once daily 01/02/15   Milagros Loll, MD  losartan (COZAAR) 50 MG tablet Take 1 tablet (50 mg total) by mouth daily. 12/08/14   Milagros Loll, MD  pantoprazole (PROTONIX) 20 MG tablet Take 1 tablet (20 mg total) by mouth daily. 02/08/15   Shela Leff, MD  potassium chloride SA (K-DUR,KLOR-CON) 20 MEQ tablet take 1 tablet by mouth once daily 08/03/14   Luan Moore, MD  ticagrelor (BRILINTA) 90 MG TABS tablet Take 1 tablet (90 mg total) by mouth 2 (two) times daily. 09/29/14   Francesca Oman, DO  Vitamin D, Cholecalciferol, 1000 UNITS CAPS Take 1 capsule by mouth daily. 12/08/14   Milagros Loll, MD   Triage Vitals: Ht 5' 4.5" (1.638 m)  Wt 160 lb (72.576 kg)  BMI 27.05 kg/m2  SpO2 97%   Physical Exam  Constitutional: She is oriented to person, place, and time. She appears well-developed and well-nourished. No distress.  HENT:  Head: Normocephalic and atraumatic.  Nose: Nose normal.  Mouth/Throat: Oropharynx is clear and moist. No oropharyngeal exudate.  Eyes: Conjunctivae and EOM are normal. Pupils are equal, round, and reactive to light. No scleral icterus.  Neck: Normal range of motion. Neck supple. No  JVD present. No tracheal deviation present. No thyromegaly present.  Cardiovascular: Normal rate, regular rhythm and normal heart sounds.  Exam reveals no gallop and no friction rub.   No murmur heard. Pulmonary/Chest: Effort normal and breath sounds normal. No respiratory distress. She has no wheezes. She exhibits no tenderness.  Abdominal: Soft. Bowel sounds are normal. She exhibits no distension and no mass. There is no tenderness. There is no rebound and no guarding.  Musculoskeletal: Normal range of motion. She exhibits no edema or tenderness.  Lymphadenopathy:    She has no cervical adenopathy.  Neurological: She is alert and oriented to person, place, and time. No cranial nerve deficit. She exhibits normal muscle tone.  Skin: Skin is warm and dry. No rash noted. No  erythema. No pallor.  Nursing note and vitals reviewed.   ED Course  Procedures (including critical care time)  DIAGNOSTIC STUDIES: Oxygen Saturation is 97% on RA, Normal by my interpretation.    COORDINATION OF CARE: 3:46 AM- Will order i-stat troponin, CXR, CBC, BMP, EKG, and PT-INR. Discussed treatment plan with pt at bedside and pt agreed to plan.     Labs Review Labs Reviewed  CBC WITH DIFFERENTIAL/PLATELET - Abnormal; Notable for the following:    RBC 3.12 (*)    Hemoglobin 9.6 (*)    HCT 29.2 (*)    All other components within normal limits  BRAIN NATRIURETIC PEPTIDE - Abnormal; Notable for the following:    B Natriuretic Peptide 107.0 (*)    All other components within normal limits  BASIC METABOLIC PANEL - Abnormal; Notable for the following:    Potassium 3.3 (*)    Glucose, Bld 119 (*)    BUN 32 (*)    Creatinine, Ser 1.49 (*)    GFR calc non Af Amer 31 (*)    GFR calc Af Amer 36 (*)    All other components within normal limits  PROTIME-INR  Randolm Idol, ED    Imaging Review Dg Chest 2 View  03/29/2015   CLINICAL DATA:  Shortness of breath since yesterday  EXAM: CHEST  2 VIEW  COMPARISON:   02/05/2015  FINDINGS: Chronic cardiomegaly and mild aortic tortuosity. Coronary atherosclerosis or stent noted. Chronic hyperinflation. There is no edema, consolidation, effusion, or pneumothorax. No acute osseous findings.  IMPRESSION: 1. No evidence of acute cardiopulmonary disease. 2. Chronic cardiomegaly. 3. COPD.   Electronically Signed   By: Monte Fantasia M.D.   On: 03/29/2015 05:14   I have personally reviewed and evaluated these images and lab results as part of my medical decision-making.   EKG Interpretation   Date/Time:  Wednesday March 29 2015 03:51:47 EDT Ventricular Rate:  78 PR Interval:  148 QRS Duration: 94 QT Interval:  412 QTC Calculation: 469 R Axis:   10 Text Interpretation:  Sinus rhythm Abnormal R-wave progression, early  transition Borderline T abnormalities, anterior leads No significant  change since last tracing Confirmed by Glynn Octave [phone]) on  03/29/2015 3:54:41 AM      MDM   Final diagnoses:  None    Patient presents to the emergency department for shortness of breath. She states it is been no worse than her normal dyspnea. She has been seen multiple times since emergency department for similar complaints.  Chest x-ray shows COPD and no acute process. EKG shows no acute changes. Initial troponin is negative. Patient is low risk for ACS as her history is not consistent with this diagnosis. She has a primary care appointment today at 3 PM, I encouraged her to attend this appointment for close follow-up. She demonstrates understanding. She was observed overnight and was resting comfortably upon my repeat evaluation. Her vital signs remain within her normal limits and she is safe for discharge.  I personally performed the services described in this documentation, which was scribed in my presence. The recorded information has been reviewed and is accurate.   Jane Balls, MD 03/29/15 [phone]

## 2015-03-29 NOTE — ED Notes (Signed)
Pt to go home in taxi.

## 2015-03-29 NOTE — ED Notes (Signed)
Dr. Oni at bedside. 

## 2015-03-29 NOTE — ED Notes (Signed)
[employer] called to pick up pt. -

## 2015-03-30 ENCOUNTER — Encounter: Payer: Self-pay | Admitting: Pulmonary Disease

## 2015-03-30 ENCOUNTER — Ambulatory Visit (INDEPENDENT_AMBULATORY_CARE_PROVIDER_SITE_OTHER): Payer: Medicare Other | Admitting: Pulmonary Disease

## 2015-03-30 VITALS — BP 110/63 | HR 84 | Temp 97.7°F | Ht 65.0 in | Wt 160.2 lb

## 2015-03-30 DIAGNOSIS — E559 Vitamin D deficiency, unspecified: Secondary | ICD-10-CM

## 2015-03-30 DIAGNOSIS — I2510 Atherosclerotic heart disease of native coronary artery without angina pectoris: Secondary | ICD-10-CM | POA: Diagnosis not present

## 2015-03-30 DIAGNOSIS — Z7982 Long term (current) use of aspirin: Secondary | ICD-10-CM

## 2015-03-30 DIAGNOSIS — I509 Heart failure, unspecified: Secondary | ICD-10-CM | POA: Diagnosis not present

## 2015-03-30 DIAGNOSIS — Z23 Encounter for immunization: Secondary | ICD-10-CM

## 2015-03-30 DIAGNOSIS — I25119 Atherosclerotic heart disease of native coronary artery with unspecified angina pectoris: Secondary | ICD-10-CM

## 2015-03-30 DIAGNOSIS — Z955 Presence of coronary angioplasty implant and graft: Secondary | ICD-10-CM | POA: Diagnosis not present

## 2015-03-30 DIAGNOSIS — D649 Anemia, unspecified: Secondary | ICD-10-CM | POA: Diagnosis not present

## 2015-03-30 DIAGNOSIS — J449 Chronic obstructive pulmonary disease, unspecified: Secondary | ICD-10-CM

## 2015-03-30 DIAGNOSIS — N183 Chronic kidney disease, stage 3 unspecified: Secondary | ICD-10-CM

## 2015-03-30 DIAGNOSIS — E538 Deficiency of other specified B group vitamins: Secondary | ICD-10-CM

## 2015-03-30 DIAGNOSIS — D539 Nutritional anemia, unspecified: Secondary | ICD-10-CM | POA: Diagnosis not present

## 2015-03-30 DIAGNOSIS — J439 Emphysema, unspecified: Secondary | ICD-10-CM

## 2015-03-30 DIAGNOSIS — I110 Hypertensive heart disease with heart failure: Secondary | ICD-10-CM | POA: Diagnosis not present

## 2015-03-30 MED ORDER — CYANOCOBALAMIN 1000 MCG/ML IJ SOLN
1000.0000 ug | Freq: Once | INTRAMUSCULAR | Status: AC
  Administered 2015-03-30: 1000 ug via INTRAMUSCULAR

## 2015-03-30 NOTE — Progress Notes (Signed)
Subjective:    Patient ID: Jane [name], female    DOB: 1929/02/18, 81 y.o.   MRN: [id number]  HPI [name] is an 81 year old woman with history of anemia anemia, GERD, hyperlipidemia, stroke, CAD, chronic diastolic CHF, COPD, CKD stage III, hypertension presenting for follow-up.  She was seen in the ED on 03/29/2015 for shortness of breath. Workup unremarkable. BNP 107 (previous 195.5). Troponin POC 0. Hgb stable at baseline around 9. Mild hypokalemia and mild increase in creatinine above baseline to 1.49 from 1.2. CXR with no acute process.  She reports her dypsnea has improved. Her dyspnea also improves with albuterol.   Review of Systems Constitutional: no fevers/chills Eyes: no vision changes Ears, nose, mouth, throat, and face: no cough Respiratory: no shortness of breath Cardiovascular: no chest pain Gastrointestinal: no nausea/vomiting, no abdominal pain, no constipation, no diarrhea Genitourinary: no dysuria, no hematuria Integument: no rash Hematologic/lymphatic: no bleeding/bruising, no edema Musculoskeletal: no arthralgias, no myalgias Neurological: no paresthesias, no weakness  Past Medical History  Diagnosis Date  . Anemia   . Anxiety   . Arthritis   . GERD (gastroesophageal reflux disease) 2009    EGD with benign gastric polyp too  . Hyperlipidemia   . Hypertension   . Vitamin D deficiency   . Colon polyp     2009 colonoscopy, not retrieved for pathology  . Hiatal hernia   . Diverticulosis 2009  . Aortic insufficiency     mild  . Stroke 1975  . CKD (chronic kidney disease) stage 3, GFR 30-59 ml/min   . CHF (congestive heart failure)   . CAD (coronary artery disease)     cath 08/09/2014 95% stenosis in prox to mid RCA s/p DES, 80-90% prox OM2, 50% distal LAD    Current Outpatient Prescriptions on File Prior to Visit  Medication Sig Dispense Refill  . acetaminophen (TYLENOL) 325 MG tablet Take 650 mg by mouth every 6 (six) hours as needed for  mild pain.    Marland Kitchen albuterol (PROVENTIL HFA;VENTOLIN HFA) 108 (90 BASE) MCG/ACT inhaler Inhale 2 puffs into the lungs every 4 (four) hours as needed for wheezing or shortness of breath. 1 Inhaler 3  . aspirin EC 81 MG tablet Take 81 mg by mouth daily.    Marland Kitchen atorvastatin (LIPITOR) 20 MG tablet Take 1 tablet (20 mg total) by mouth daily at 6 PM. 30 tablet 3  . bisoprolol (ZEBETA) 5 MG tablet Take 0.5 tablets (2.5 mg total) by mouth daily. 90 tablet 1  . cyanocobalamin (,VITAMIN B-12,) 1000 MCG/ML injection Inject 1 mL (1,000 mcg total) into the muscle every 30 (thirty) days. 1 mL 0  . furosemide (LASIX) 40 MG tablet Take 1 tablet (40 mg total) by mouth daily. 90 tablet 3  . hydroxypropyl methylcellulose / hypromellose (ISOPTO TEARS / GONIOVISC) 2.5 % ophthalmic solution Place 1 drop into the left eye 4 (four) times daily as needed for dry eyes. 15 mL 2  . isosorbide mononitrate (IMDUR) 30 MG 24 hr tablet Take 1 tablet (30 mg total) by mouth daily. 30 tablet 1  . isosorbide mononitrate (IMDUR) 30 MG 24 hr tablet take 1 tablet by mouth once daily 30 tablet 3  . losartan (COZAAR) 50 MG tablet Take 1 tablet (50 mg total) by mouth daily. 30 tablet 3  . pantoprazole (PROTONIX) 20 MG tablet Take 1 tablet (20 mg total) by mouth daily. 30 tablet 2  . potassium chloride SA (K-DUR,KLOR-CON) 20 MEQ tablet take 1 tablet  by mouth once daily 90 tablet 3  . ticagrelor (BRILINTA) 90 MG TABS tablet Take 1 tablet (90 mg total) by mouth 2 (two) times daily. 60 tablet 1  . Vitamin D, Cholecalciferol, 1000 UNITS CAPS Take 1 capsule by mouth daily. 30 capsule 3   No current facility-administered medications on file prior to visit.    Today's Vitals   03/30/15 1536  BP: 110/63  Pulse: 84  Temp: 97.7 F (36.5 C)  TempSrc: Oral  Height: 5\' 5"  (1.651 m)  Weight: 160 lb 3.2 oz (72.666 kg)  SpO2: 100%    Objective:  Physical Exam  Constitutional: She is oriented to person, place, and time. She appears well-developed.  No distress.  HENT:  Head: Normocephalic and atraumatic.  Mouth/Throat: Oropharynx is clear and moist.  Eyes: EOM are normal. Pupils are equal, round, and reactive to light.  Neck: Neck supple.  Cardiovascular: Normal rate, regular rhythm and normal heart sounds.  Exam reveals no gallop and no friction rub.   No murmur heard. Pulmonary/Chest: Effort normal and breath sounds normal. No respiratory distress. She has no wheezes. She has no rales.  Abdominal: Soft. Bowel sounds are normal. She exhibits no distension.  Musculoskeletal: Normal range of motion. She exhibits no edema or tenderness.  Neurological: She is alert and oriented to person, place, and time. No cranial nerve deficit.  Skin: Skin is warm and dry. She is not diaphoretic.  Psychiatric: She has a normal mood and affect.    Assessment & Plan:  Please refer to problem based charting.

## 2015-03-31 DIAGNOSIS — N189 Chronic kidney disease, unspecified: Secondary | ICD-10-CM | POA: Insufficient documentation

## 2015-03-31 LAB — BMP8+ANION GAP
Anion Gap: 22 mmol/L — ABNORMAL HIGH (ref 10.0–18.0)
BUN/Creatinine Ratio: 20 (ref 11–26)
BUN: 24 mg/dL (ref 8–27)
CO2: 17 mmol/L — AB (ref 18–29)
CREATININE: 1.2 mg/dL — AB (ref 0.57–1.00)
Calcium: 9.4 mg/dL (ref 8.7–10.3)
Chloride: 105 mmol/L (ref 97–108)
GFR calc non Af Amer: 42 mL/min/{1.73_m2} — ABNORMAL LOW (ref >59)
GFR, EST AFRICAN AMERICAN: 48 mL/min/{1.73_m2} — AB (ref >59)
GLUCOSE: 96 mg/dL (ref 65–99)
Potassium: 4.2 mmol/L (ref 3.5–5.2)
SODIUM: 144 mmol/L (ref 134–144)

## 2015-03-31 LAB — ANEMIA PROFILE B
BASOS ABS: 0 10*3/uL (ref 0.0–0.2)
Basos: 0 %
EOS (ABSOLUTE): 0 10*3/uL (ref 0.0–0.4)
Eos: 0 %
Ferritin: 508 ng/mL — ABNORMAL HIGH (ref 15–150)
Folate: 17 ng/mL (ref >3.0)
Hematocrit: 27.6 % — ABNORMAL LOW (ref 34.0–46.6)
Hemoglobin: 9.4 g/dL — ABNORMAL LOW (ref 11.1–15.9)
Immature Grans (Abs): 0 10*3/uL (ref 0.0–0.1)
Immature Granulocytes: 0 %
Iron Saturation: 32 % (ref 15–55)
Iron: 75 ug/dL (ref 27–139)
LYMPHS ABS: 1.2 10*3/uL (ref 0.7–3.1)
Lymphs: 26 %
MCH: 31.8 pg (ref 26.6–33.0)
MCHC: 34.1 g/dL (ref 31.5–35.7)
MCV: 93 fL (ref 79–97)
MONOS ABS: 0.5 10*3/uL (ref 0.1–0.9)
Monocytes: 10 %
NEUTROS ABS: 3.1 10*3/uL (ref 1.4–7.0)
NEUTROS PCT: 64 %
PLATELETS: 158 10*3/uL (ref 150–379)
RBC: 2.96 x10E6/uL — ABNORMAL LOW (ref 3.77–5.28)
RDW: 14.4 % (ref 12.3–15.4)
Retic Ct Pct: 1.1 % (ref 0.6–2.6)
Total Iron Binding Capacity: 234 ug/dL — ABNORMAL LOW (ref 250–450)
UIBC: 159 ug/dL (ref 118–369)
WBC: 4.8 10*3/uL (ref 3.4–10.8)

## 2015-03-31 LAB — VITAMIN D 25 HYDROXY (VIT D DEFICIENCY, FRACTURES): VIT D 25 HYDROXY: 8.9 ng/mL — AB (ref 30.0–100.0)

## 2015-03-31 MED ORDER — VITAMIN D (CHOLECALCIFEROL) 25 MCG (1000 UT) PO CAPS: 1.0000 | ORAL_CAPSULE | Freq: Every day | ORAL | Status: DC

## 2015-03-31 NOTE — Assessment & Plan Note (Signed)
Vitamin D level low and she has not been taking her supplement.  Plan: -Refill Vitamin D 1000mg  daily. Would avoid doing 50000u q week for 6 weeks as she is at risk for taking the wrong dose with her dementia.

## 2015-03-31 NOTE — Assessment & Plan Note (Signed)
Iron/TIBC/Ferritin/ %Sat    Component Value Date/Time   IRON 75 03/30/2015 1634   IRON 75 10/16/2014 1159   TIBC 234* 03/30/2015 1634   TIBC 221* 10/16/2014 1159   FERRITIN 508* 03/30/2015 1634   FERRITIN 366* 10/16/2014 1159   IRONPCTSAT 34 10/16/2014 1159   Discontinue ferrous sulfate.

## 2015-03-31 NOTE — Assessment & Plan Note (Signed)
PCI in 08/2014. She is on ASA and Brillinta. She is interested in getting a procedure done on her eyes for which she would need to have her medications held. Do not see follow up with cardiology in chart since she had PCI.  Plan: -Place referral for follow up with cardiology.

## 2015-03-31 NOTE — Assessment & Plan Note (Signed)
Vitamin B12 level elevated but collected after she received her monthly injection. Will check next month prior to her injection.

## 2015-03-31 NOTE — Assessment & Plan Note (Signed)
Her recent ED visit was likely related to COPD as her dyspnea improved with albuterol. No signs of acute exacerbation of CHF.   Plan: -PFTs ordered. -Will likely start her on an inhaled daily treatment depending on severity of her COPD.

## 2015-03-31 NOTE — Addendum Note (Signed)
Addended by: Jacques Earthly T on: 03/31/2015 11:46 PM   Modules accepted: Orders

## 2015-03-31 NOTE — Assessment & Plan Note (Signed)
Creatinine back to baseline of around 1.2. Hypokalemia resolved.

## 2015-04-01 NOTE — Progress Notes (Signed)
Medicine attending: Medical history, presenting problems, physical findings, and medications, reviewed with Dr Jacques Earthly on the day of the patient vist and I concur with her evaluation and management plan.

## 2015-04-13 ENCOUNTER — Ambulatory Visit (INDEPENDENT_AMBULATORY_CARE_PROVIDER_SITE_OTHER): Payer: Medicare Other | Admitting: Cardiology

## 2015-04-13 ENCOUNTER — Encounter: Payer: Self-pay | Admitting: Cardiology

## 2015-04-13 VITALS — BP 102/54 | HR 74 | Ht 64.0 in | Wt 162.4 lb

## 2015-04-13 DIAGNOSIS — I10 Essential (primary) hypertension: Secondary | ICD-10-CM

## 2015-04-13 DIAGNOSIS — I2510 Atherosclerotic heart disease of native coronary artery without angina pectoris: Secondary | ICD-10-CM | POA: Diagnosis not present

## 2015-04-13 DIAGNOSIS — E785 Hyperlipidemia, unspecified: Secondary | ICD-10-CM | POA: Diagnosis not present

## 2015-04-13 DIAGNOSIS — I25119 Atherosclerotic heart disease of native coronary artery with unspecified angina pectoris: Secondary | ICD-10-CM

## 2015-04-13 NOTE — Progress Notes (Signed)
Cardiology Office Note   Date:  04/13/2015   ID:  Jane [name], DOB 07-11-1928, MRN [id number]  PCP:  Jacques Earthly, MD  Cardiologist:    Dr. Martinique  Chief Complaint  Patient presents with  . Coronary Artery Disease    no chest pain      History of Present Illness: Jane [name] is a 80 y.o. female who presents for post PCI in 08/2014 and our input for eye surgery.  In 08/2014 admitted with NSTEMI and + nuc study, underwent cath . 95% RCA stenosis with DES stent synergy placed.  Pt on ASA and Brilinta. She had residual disease in OM1 and 2 that would be treated medically     Recently in ER for SOB but troponin Neg and BNP 107, SOB thought to be her COPD.  The SOB responded to inhalers.     ECHO 08/2014  Study Conclusions - Left ventricle: The cavity size was normal. Wall thickness was increased in a pattern of mild LVH. The estimated ejection fraction was 60%. Wall motion was normal; there were no regional wall motion abnormalities. - Aortic valve: There was mild regurgitation. - Right ventricle: The cavity size was normal. Systolic function was normal. - Pulmonary arteries: PA peak pressure: 29 mm Hg (S). - Pericardium, extracardiac: A trivial pericardial effusion was identified posterior to the heart.  Today she reports 2 mild episode of upper abd pain.  Pt does have some memory issues though did remember stents to her Coronary artery.  She has some dyspnea with exertion but no worse than usual.  Her eye surgery would be with Dr Lorina Rabon  [phone].   She does not feel this needs to be done urgently.  With coronary stents in Feb of this year would recommend holding off surgery until 1 year. She should continue her Brilinta and ASA through Feb 2017.     Past Medical History  Diagnosis Date  . Anemia   . Anxiety   . Arthritis   . GERD (gastroesophageal reflux disease) 2009    EGD with benign gastric polyp too  . Hyperlipidemia   . Hypertension   .  Vitamin D deficiency   . Colon polyp     2009 colonoscopy, not retrieved for pathology  . Hiatal hernia   . Diverticulosis 2009  . Aortic insufficiency     mild  . Stroke ([place]) 1975  . CKD (chronic kidney disease) stage 3, GFR 30-59 ml/min   . CHF (congestive heart failure) ([place])   . CAD (coronary artery disease)     cath 08/09/2014 95% stenosis in prox to mid RCA s/p DES, 80-90% prox OM2, 50% distal LAD    Past Surgical History  Procedure Laterality Date  . Appendectomy    . Abdominal hysterectomy    . Artery biopsy Left 12/16/2012    Procedure: BIOPSY TEMPORAL ARTERY;  Surgeon: Mal Misty, MD;  Location: [facility];  Service: Vascular;  Laterality: Left;  . Left heart catheterization with coronary angiogram N/A 08/09/2014    Procedure: LEFT HEART CATHETERIZATION WITH CORONARY ANGIOGRAM;  Surgeon: Peter M Martinique, MD;  Location: [facility] CATH LAB;  Service: Cardiovascular;  Laterality: N/A;  . Percutaneous coronary stent intervention (pci-s)  08/09/2014    Procedure: PERCUTANEOUS CORONARY STENT INTERVENTION (PCI-S);  Surgeon: Peter M Martinique, MD;  Location: [facility] CATH LAB;  Service: Cardiovascular;;     Current Outpatient Prescriptions  Medication Sig Dispense Refill  . acetaminophen (TYLENOL) 325 MG tablet Take 650 mg  by mouth every 6 (six) hours as needed for mild pain.    Marland Kitchen albuterol (PROVENTIL HFA;VENTOLIN HFA) 108 (90 BASE) MCG/ACT inhaler Inhale 2 puffs into the lungs every 4 (four) hours as needed for wheezing or shortness of breath. 1 Inhaler 3  . aspirin EC 81 MG tablet Take 81 mg by mouth daily.    Marland Kitchen atorvastatin (LIPITOR) 20 MG tablet Take 1 tablet (20 mg total) by mouth daily at 6 PM. 30 tablet 3  . bisoprolol (ZEBETA) 5 MG tablet Take 0.5 tablets (2.5 mg total) by mouth daily. 90 tablet 1  . cyanocobalamin (,VITAMIN B-12,) 1000 MCG/ML injection Inject 1 mL (1,000 mcg total) into the muscle every 30 (thirty) days. 1 mL 0  . furosemide (LASIX) 40 MG tablet Take 1 tablet (40 mg total) by  mouth daily. 90 tablet 3  . hydroxypropyl methylcellulose / hypromellose (ISOPTO TEARS / GONIOVISC) 2.5 % ophthalmic solution Place 1 drop into the left eye 4 (four) times daily as needed for dry eyes. 15 mL 2  . isosorbide mononitrate (IMDUR) 30 MG 24 hr tablet Take 1 tablet (30 mg total) by mouth daily. 30 tablet 1  . losartan (COZAAR) 50 MG tablet Take 1 tablet (50 mg total) by mouth daily. (Patient taking differently: Take 50 mg by mouth daily. 1.5 tab) 30 tablet 3  . pantoprazole (PROTONIX) 20 MG tablet Take 1 tablet (20 mg total) by mouth daily. 30 tablet 2  . potassium chloride SA (K-DUR,KLOR-CON) 20 MEQ tablet take 1 tablet by mouth once daily 90 tablet 3  . ticagrelor (BRILINTA) 90 MG TABS tablet Take 1 tablet (90 mg total) by mouth 2 (two) times daily. 60 tablet 1   No current facility-administered medications for this visit.    Allergies:   Review of patient's allergies indicates no known allergies.    Social History:  The patient  reports that she quit smoking about 35 years ago. Her smoking use included Cigarettes. She has quit using smokeless tobacco. She reports that she does not drink alcohol or use illicit drugs.   Family History:  The patient's family history includes Diabetes in her sister; Hypertension in her father and mother; Stroke in her father and mother.    ROS:  General:no colds or fevers, no weight changes Skin:no rashes or ulcers HEENT:no blurred vision, no congestion, lt eyelid with droop CV:see HPI PUL:see HPI GI:no diarrhea constipation or melena, no indigestion GU:no hematuria, no dysuria MS:no joint pain, no claudication Neuro:no syncope, no lightheadedness Endo:no diabetes, no thyroid disease  Wt Readings from Last 3 Encounters:  04/13/15 162 lb 6.4 oz (73.664 kg)  03/30/15 160 lb 3.2 oz (72.666 kg)  03/29/15 160 lb (72.576 kg)     PHYSICAL EXAM: VS:  BP 102/54 mmHg  Pulse 74  Ht 5\' 4"  (1.626 m)  Wt 162 lb 6.4 oz (73.664 kg)  BMI 27.86 kg/m2   SpO2 96% , BMI Body mass index is 27.86 kg/(m^2). General:Pleasant affect, NAD Skin:Warm and dry, brisk capillary refill HEENT:normocephalic, sclera clear, mucus membranes moist, lt lower lid droop Neck:supple, no JVD, no bruits  Heart:S1S2 RRR without murmur, gallup, rub or click Lungs:clear without rales, rhonchi, or wheezes KGM:WNUU, non tender, + BS, do not palpate liver spleen or masses Ext:no lower ext edema, 2+ pedal pulses, 2+ radial pulses Neuro:alert and oriented, MAE, follows commands, + facial symmetry    EKG:  EKG is not ordered today. ekg from ER without acute changes    Recent Labs: 05/31/2014:  TSH 0.923 06/12/2014: Pro B Natriuretic peptide (BNP) 341.2 10/16/2014: Magnesium 1.8 02/05/2015: ALT 12* 03/29/2015: B Natriuretic Peptide 107.0*; Hemoglobin 9.6*; Platelets 150 03/30/2015: BUN 24; Creatinine, Ser 1.20*; Potassium 4.2; Sodium 144    Lipid Panel    Component Value Date/Time   CHOL 193 06/01/2014 0633   TRIG 84 06/01/2014 0633   HDL 45 06/01/2014 0633   CHOLHDL 4.3 06/01/2014 0633   VLDL 17 06/01/2014 0633   LDLCALC 131* 06/01/2014 4081       Other studies Reviewed: Additional studies/ records that were reviewed today include: hospitalization.   Cardiac cath:. Final Conclusions:  1. Severe 2 vessel obstructive CAD 2. Normal LV function 3. Successful stenting of the RCA with a Synergy DES.  Per Dr. Martinique "I would treat residual disease in OM 1 and 2 medically. If she has limiting angina despite good medical therapy she would be a candidate for PCI of OM2 but I would anticipate she will do well without additional intervention."  ASSESSMENT AND PLAN:  1.  CAD with NSTEMI in Feb 2016.  Continue brilinta and asa.  No surgery until 08/2015 unless she needs urgently - will have her follow up with Dr. Martinique in Jan.2017 and give surgical risk then.    2. Hyperlipidemia on statin, followed by PCP  3. HTN essential controlled.  4.hx CHF- euvolemic  today.   Current medicines are reviewed with the patient today.  The patient Has no concerns regarding medicines.  The following changes have been made:  See above Labs/ tests ordered today include:see above  Disposition:   FU:  see above  Lennie Muckle, NP  04/13/2015 2:29 PM    [facility] [place] [place], [place] Phone: [phone]; Fax: [phone]

## 2015-04-13 NOTE — Patient Instructions (Addendum)
Medication Instructions:  Your physician recommends that you continue on your current medications as directed. Please refer to the Current Medication list given to you today.   Labwork: None ordered  Testing/Procedures: None ordered  Follow-Up: Your physician recommends that you schedule a follow-up appointment in January 2017, with Dr. Martinique

## 2015-04-18 ENCOUNTER — Other Ambulatory Visit: Payer: Self-pay | Admitting: Internal Medicine

## 2015-04-20 ENCOUNTER — Other Ambulatory Visit: Payer: Self-pay | Admitting: Pulmonary Disease

## 2015-04-20 NOTE — Telephone Encounter (Signed)
Pt requesting pantoprazole, albuterol inhaler and Brilinta to be filled @ [facility] delivery.

## 2015-04-20 NOTE — Telephone Encounter (Signed)
All are ready for pick up at [facility] on [address], the pharmacist states [facility] does not have a mail order pharmacy. Tried to call pt for clarification and got no answer nor vmail. Pharmacy is going to try calling also

## 2015-05-04 ENCOUNTER — Encounter: Payer: Medicare Other | Admitting: Pulmonary Disease

## 2015-05-19 ENCOUNTER — Other Ambulatory Visit: Payer: Self-pay | Admitting: Internal Medicine

## 2015-05-31 ENCOUNTER — Other Ambulatory Visit: Payer: Self-pay | Admitting: Pulmonary Disease

## 2015-06-08 ENCOUNTER — Other Ambulatory Visit: Payer: Self-pay | Admitting: Pulmonary Disease

## 2015-06-08 ENCOUNTER — Encounter: Payer: Medicare Other | Admitting: Pulmonary Disease

## 2015-06-08 MED ORDER — TICAGRELOR 90 MG PO TABS: 90.0000 mg | ORAL_TABLET | Freq: Two times a day (BID) | ORAL | Status: DC

## 2015-06-08 NOTE — Telephone Encounter (Signed)
Pt requesting Brilinta to be filled.

## 2015-07-18 ENCOUNTER — Ambulatory Visit (INDEPENDENT_AMBULATORY_CARE_PROVIDER_SITE_OTHER): Payer: Medicare Other | Admitting: Cardiology

## 2015-07-18 ENCOUNTER — Encounter: Payer: Self-pay | Admitting: Cardiology

## 2015-07-18 VITALS — BP 110/62 | HR 80 | Ht 64.0 in | Wt 159.0 lb

## 2015-07-18 DIAGNOSIS — I359 Nonrheumatic aortic valve disorder, unspecified: Secondary | ICD-10-CM | POA: Diagnosis not present

## 2015-07-18 DIAGNOSIS — I25119 Atherosclerotic heart disease of native coronary artery with unspecified angina pectoris: Secondary | ICD-10-CM | POA: Diagnosis not present

## 2015-07-18 NOTE — Patient Instructions (Signed)
After August 10, 2015 you can stop taking Brilinta  Continue your other therapy  You can use saline eye drops. You should see an eye doctor about your eye lid.

## 2015-07-18 NOTE — Progress Notes (Signed)
Cardiology Office Note   Date:  07/18/2015   ID:  Jane [name], DOB 10/27/1928, MRN [id number]  PCP:  Jacques Earthly, MD  Cardiologist:    Dr. Martinique  Chief Complaint  Patient presents with  . Follow-up    no chest pain, no shortness of breath, no swelling, no cramping, no dizziness or lightheadedness      History of Present Illness: Jane [name] is a 82 y.o. female who presents for follow up CAD.  In 08/2014 admitted with NSTEMI and + nuc study, underwent cath . 95% RCA stenosis with DES stent synergy placed.  Pt on ASA and Brilinta. She had residual disease in OM1 and 2 that would be treated medically      ECHO 08/2014  Study Conclusions - Left ventricle: The cavity size was normal. Wall thickness was increased in a pattern of mild LVH. The estimated ejection fraction was 60%. Wall motion was normal; there were no regional wall motion abnormalities. - Aortic valve: There was mild regurgitation. - Right ventricle: The cavity size was normal. Systolic function was normal. - Pulmonary arteries: PA peak pressure: 29 mm Hg (S). - Pericardium, extracardiac: A trivial pericardial effusion was identified posterior to the heart.  On follow up today she has no cardiac complaints. She denies chest pain or SOB. She has lid lag on the left eyeand is considering surgery.  Her eye surgery would be with Dr Lorina Rabon  [id number].    She should continue her Brilinta and ASA through Feb 2017.     Past Medical History  Diagnosis Date  . Anemia   . Anxiety   . Arthritis   . GERD (gastroesophageal reflux disease) 2009    EGD with benign gastric polyp too  . Hyperlipidemia   . Hypertension   . Vitamin D deficiency   . Colon polyp     2009 colonoscopy, not retrieved for pathology  . Hiatal hernia   . Diverticulosis 2009  . Aortic insufficiency     mild  . Stroke ([place]) 1975  . CKD (chronic kidney disease) stage 3, GFR 30-59 ml/min   . CHF (congestive heart failure)  ([place])   . CAD (coronary artery disease)     cath 08/09/2014 95% stenosis in prox to mid RCA s/p DES, 80-90% prox OM2, 50% distal LAD    Past Surgical History  Procedure Laterality Date  . Appendectomy    . Abdominal hysterectomy    . Artery biopsy Left 12/16/2012    Procedure: BIOPSY TEMPORAL ARTERY;  Surgeon: Mal Misty, MD;  Location: [facility];  Service: Vascular;  Laterality: Left;  . Left heart catheterization with coronary angiogram N/A 08/09/2014    Procedure: LEFT HEART CATHETERIZATION WITH CORONARY ANGIOGRAM;  Surgeon: Peter M Martinique, MD;  Location: [facility] CATH LAB;  Service: Cardiovascular;  Laterality: N/A;  . Percutaneous coronary stent intervention (pci-s)  08/09/2014    Procedure: PERCUTANEOUS CORONARY STENT INTERVENTION (PCI-S);  Surgeon: Peter M Martinique, MD;  Location: [facility] CATH LAB;  Service: Cardiovascular;;     Current Outpatient Prescriptions  Medication Sig Dispense Refill  . acetaminophen (TYLENOL) 325 MG tablet Take 650 mg by mouth every 6 (six) hours as needed for mild pain.    Marland Kitchen albuterol (PROVENTIL HFA;VENTOLIN HFA) 108 (90 BASE) MCG/ACT inhaler Inhale 2 puffs into the lungs every 4 (four) hours as needed for wheezing or shortness of breath. 1 Inhaler 3  . aspirin EC 81 MG tablet Take 81 mg by mouth daily.    Marland Kitchen  bisoprolol (ZEBETA) 5 MG tablet Take 0.5 tablets (2.5 mg total) by mouth daily. 90 tablet 1  . cyanocobalamin (,VITAMIN B-12,) 1000 MCG/ML injection Inject 1 mL (1,000 mcg total) into the muscle every 30 (thirty) days. 1 mL 0  . ferrous sulfate 325 (65 FE) MG tablet Take 325 mg by mouth daily with breakfast.    . furosemide (LASIX) 40 MG tablet Take 1 tablet (40 mg total) by mouth daily. 90 tablet 3  . isosorbide mononitrate (IMDUR) 30 MG 24 hr tablet take 1 tablet by mouth once daily 30 tablet 3  . losartan (COZAAR) 50 MG tablet Take 1 tablet (50 mg total) by mouth daily. (Patient taking differently: Take 50 mg by mouth daily. 1.5 tab) 30 tablet 3  . pantoprazole  (PROTONIX) 20 MG tablet take 1 tablet by mouth once daily 30 tablet 6  . potassium chloride SA (K-DUR,KLOR-CON) 20 MEQ tablet take 1 tablet by mouth once daily 90 tablet 3  . ticagrelor (BRILINTA) 90 MG TABS tablet Take 1 tablet (90 mg total) by mouth 2 (two) times daily. 60 tablet 0  . atorvastatin (LIPITOR) 20 MG tablet Take 1 tablet (20 mg total) by mouth daily at 6 PM. (Patient not taking: Reported on 07/18/2015) 30 tablet 3  . hydroxypropyl methylcellulose / hypromellose (ISOPTO TEARS / GONIOVISC) 2.5 % ophthalmic solution Place 1 drop into the left eye 4 (four) times daily as needed for dry eyes. (Patient not taking: Reported on 07/18/2015) 15 mL 2   No current facility-administered medications for this visit.    Allergies:   Review of patient's allergies indicates no known allergies.    Social History:  The patient  reports that she quit smoking about 36 years ago. Her smoking use included Cigarettes. She has quit using smokeless tobacco. She reports that she does not drink alcohol or use illicit drugs.   Family History:  The patient's family history includes Diabetes in her sister; Hypertension in her father and mother; Stroke in her father and mother.    ROS:  General:no colds or fevers, no weight changes Skin:no rashes or ulcers HEENT:no blurred vision, no congestion, lt eyelid with droop CV:see HPI PUL:see HPI GI:no diarrhea constipation or melena, no indigestion GU:no hematuria, no dysuria MS:no joint pain, no claudication Neuro:no syncope, no lightheadedness Endo:no diabetes, no thyroid disease  Wt Readings from Last 3 Encounters:  07/18/15 72.122 kg (159 lb)  04/13/15 73.664 kg (162 lb 6.4 oz)  03/30/15 72.666 kg (160 lb 3.2 oz)     PHYSICAL EXAM: VS:  BP 110/62 mmHg  Pulse 80  Ht 5\' 4"  (1.626 m)  Wt 72.122 kg (159 lb)  BMI 27.28 kg/m2 , BMI Body mass index is 27.28 kg/(m^2). General:Pleasant affect, NAD Skin:Warm and dry, brisk capillary  refill HEENT:normocephalic, sclera clear, mucus membranes moist, lt lower lid droop Neck:supple, no JVD, no bruits  Heart:S1S2 RRR without murmur, gallup, rub or click Lungs:clear without rales, rhonchi, or wheezes [id number], non tender, + BS, do not palpate liver spleen or masses Ext:no lower ext edema, 2+ pedal pulses, 2+ radial pulses Neuro:alert and oriented, MAE, follows commands, + facial symmetry    EKG:  EKG is not ordered today.    Recent Labs: 10/16/2014: Magnesium 1.8 02/05/2015: ALT 12* 03/29/2015: B Natriuretic Peptide 107.0*; Hemoglobin 9.6* 03/30/2015: BUN 24; Creatinine, Ser 1.20*; Platelets 158; Potassium 4.2; Sodium 144    Lipid Panel    Component Value Date/Time   CHOL 193 06/01/2014 0633   TRIG 84 06/01/2014  [id number]   HDL 45 06/01/2014 0633   CHOLHDL 4.3 06/01/2014 0633   VLDL 17 06/01/2014 0633   LDLCALC 131* 06/01/2014 [id number]       Other studies Reviewed: Additional studies/ records that were reviewed today include: hospitalization.   Cardiac cath:. Final Conclusions:  1. Severe 2 vessel obstructive CAD 2. Normal LV function 3. Successful stenting of the RCA with a Synergy DES.  Per Dr. Martinique "I would treat residual disease in OM 1 and 2 medically. If she has limiting angina despite good medical therapy she would be a candidate for PCI of OM2 but I would anticipate she will do well without additional intervention."  ASSESSMENT AND PLAN:  1.  CAD with NSTEMI in Feb 2016.  Continue brilinta and asa. She may stop Brilinta after February 9.  If she needs eye lid surgery then she will be cleared for this from a cardiac standpoint. Should stay on ASA 81 mg daily.  2. Hyperlipidemia on statin, followed by PCP  3. HTN essential controlled.  4.hx CHF- euvolemic today.   Current medicines are reviewed with the patient today.  The patient Has no concerns regarding medicines.  The following changes have been made:  See above Labs/ tests ordered today  include:see above  Disposition:   FU:  6 months.  Signed, Peter Martinique, MD  07/18/2015 5:02 PM    [place]

## 2015-08-08 ENCOUNTER — Other Ambulatory Visit: Payer: Self-pay | Admitting: Internal Medicine

## 2015-08-09 ENCOUNTER — Emergency Department (HOSPITAL_COMMUNITY): Payer: Medicare Other

## 2015-08-09 ENCOUNTER — Encounter (HOSPITAL_COMMUNITY): Payer: Self-pay | Admitting: Vascular Surgery

## 2015-08-09 ENCOUNTER — Observation Stay (HOSPITAL_COMMUNITY)
Admission: EM | Admit: 2015-08-09 | Discharge: 2015-08-11 | Disposition: A | Discharge status: Home / Self Care | Payer: Medicare Other | Attending: Internal Medicine | Admitting: Internal Medicine

## 2015-08-09 DIAGNOSIS — R05 Cough: Secondary | ICD-10-CM | POA: Insufficient documentation

## 2015-08-09 DIAGNOSIS — E559 Vitamin D deficiency, unspecified: Secondary | ICD-10-CM | POA: Insufficient documentation

## 2015-08-09 DIAGNOSIS — Z8673 Personal history of transient ischemic attack (TIA), and cerebral infarction without residual deficits: Secondary | ICD-10-CM | POA: Diagnosis not present

## 2015-08-09 DIAGNOSIS — K219 Gastro-esophageal reflux disease without esophagitis: Secondary | ICD-10-CM | POA: Insufficient documentation

## 2015-08-09 DIAGNOSIS — I2510 Atherosclerotic heart disease of native coronary artery without angina pectoris: Secondary | ICD-10-CM | POA: Insufficient documentation

## 2015-08-09 DIAGNOSIS — D638 Anemia in other chronic diseases classified elsewhere: Secondary | ICD-10-CM | POA: Diagnosis not present

## 2015-08-09 DIAGNOSIS — I130 Hypertensive heart and chronic kidney disease with heart failure and stage 1 through stage 4 chronic kidney disease, or unspecified chronic kidney disease: Secondary | ICD-10-CM | POA: Insufficient documentation

## 2015-08-09 DIAGNOSIS — H02105 Unspecified ectropion of left lower eyelid: Secondary | ICD-10-CM | POA: Diagnosis present

## 2015-08-09 DIAGNOSIS — M1990 Unspecified osteoarthritis, unspecified site: Secondary | ICD-10-CM | POA: Insufficient documentation

## 2015-08-09 DIAGNOSIS — E785 Hyperlipidemia, unspecified: Secondary | ICD-10-CM | POA: Insufficient documentation

## 2015-08-09 DIAGNOSIS — I110 Hypertensive heart disease with heart failure: Secondary | ICD-10-CM | POA: Diagnosis not present

## 2015-08-09 DIAGNOSIS — N183 Chronic kidney disease, stage 3 (moderate): Secondary | ICD-10-CM | POA: Diagnosis not present

## 2015-08-09 DIAGNOSIS — I252 Old myocardial infarction: Secondary | ICD-10-CM | POA: Diagnosis not present

## 2015-08-09 DIAGNOSIS — R0602 Shortness of breath: Secondary | ICD-10-CM | POA: Diagnosis not present

## 2015-08-09 DIAGNOSIS — I5032 Chronic diastolic (congestive) heart failure: Secondary | ICD-10-CM | POA: Insufficient documentation

## 2015-08-09 DIAGNOSIS — Z955 Presence of coronary angioplasty implant and graft: Secondary | ICD-10-CM | POA: Diagnosis present

## 2015-08-09 DIAGNOSIS — R069 Unspecified abnormalities of breathing: Secondary | ICD-10-CM | POA: Diagnosis not present

## 2015-08-09 DIAGNOSIS — I6789 Other cerebrovascular disease: Secondary | ICD-10-CM

## 2015-08-09 DIAGNOSIS — Z86010 Personal history of colonic polyps: Secondary | ICD-10-CM | POA: Diagnosis not present

## 2015-08-09 DIAGNOSIS — I509 Heart failure, unspecified: Secondary | ICD-10-CM | POA: Diagnosis not present

## 2015-08-09 DIAGNOSIS — R0600 Dyspnea, unspecified: Secondary | ICD-10-CM | POA: Diagnosis not present

## 2015-08-09 DIAGNOSIS — Z7982 Long term (current) use of aspirin: Secondary | ICD-10-CM | POA: Insufficient documentation

## 2015-08-09 DIAGNOSIS — Z87891 Personal history of nicotine dependence: Secondary | ICD-10-CM | POA: Insufficient documentation

## 2015-08-09 DIAGNOSIS — I25118 Atherosclerotic heart disease of native coronary artery with other forms of angina pectoris: Secondary | ICD-10-CM

## 2015-08-09 DIAGNOSIS — I5030 Unspecified diastolic (congestive) heart failure: Secondary | ICD-10-CM | POA: Diagnosis present

## 2015-08-09 DIAGNOSIS — R5381 Other malaise: Secondary | ICD-10-CM | POA: Diagnosis present

## 2015-08-09 DIAGNOSIS — N189 Chronic kidney disease, unspecified: Secondary | ICD-10-CM | POA: Diagnosis present

## 2015-08-09 HISTORY — DX: Unspecified ectropion of left lower eyelid: H02.105

## 2015-08-09 LAB — CBC
HCT: 28.9 % — ABNORMAL LOW (ref 36.0–46.0)
Hemoglobin: 9.7 g/dL — ABNORMAL LOW (ref 12.0–15.0)
MCH: 31.8 pg (ref 26.0–34.0)
MCHC: 33.6 g/dL (ref 30.0–36.0)
MCV: 94.8 fL (ref 78.0–100.0)
PLATELETS: 121 10*3/uL — AB (ref 150–400)
RBC: 3.05 MIL/uL — ABNORMAL LOW (ref 3.87–5.11)
RDW: 14 % (ref 11.5–15.5)
WBC: 3.4 10*3/uL — AB (ref 4.0–10.5)

## 2015-08-09 LAB — BASIC METABOLIC PANEL
ANION GAP: 11 (ref 5–15)
BUN: 37 mg/dL — ABNORMAL HIGH (ref 6–20)
CALCIUM: 9.1 mg/dL (ref 8.9–10.3)
CO2: 20 mmol/L — ABNORMAL LOW (ref 22–32)
Chloride: 114 mmol/L — ABNORMAL HIGH (ref 101–111)
Creatinine, Ser: 1.19 mg/dL — ABNORMAL HIGH (ref 0.44–1.00)
GFR, EST AFRICAN AMERICAN: 47 mL/min — AB (ref >60)
GFR, EST NON AFRICAN AMERICAN: 41 mL/min — AB (ref >60)
Glucose, Bld: 90 mg/dL (ref 65–99)
Potassium: 4 mmol/L (ref 3.5–5.1)
Sodium: 145 mmol/L (ref 135–145)

## 2015-08-09 LAB — I-STAT TROPONIN, ED: Troponin i, poc: 0.02 ng/mL (ref 0.00–0.08)

## 2015-08-09 LAB — BRAIN NATRIURETIC PEPTIDE: B NATRIURETIC PEPTIDE 5: 301.9 pg/mL — AB (ref 0.0–100.0)

## 2015-08-09 MED ORDER — ALBUTEROL SULFATE (2.5 MG/3ML) 0.083% IN NEBU: 3.0000 mL | INHALATION_SOLUTION | RESPIRATORY_TRACT | Status: DC | PRN

## 2015-08-09 MED ORDER — ALBUTEROL SULFATE (2.5 MG/3ML) 0.083% IN NEBU
2.5000 mg | INHALATION_SOLUTION | Freq: Once | RESPIRATORY_TRACT | Status: AC
  Administered 2015-08-09: 2.5 mg via RESPIRATORY_TRACT
  Filled 2015-08-09: qty 3

## 2015-08-09 MED ORDER — PANTOPRAZOLE SODIUM 20 MG PO TBEC
20.0000 mg | DELAYED_RELEASE_TABLET | Freq: Every day | ORAL | Status: DC
  Administered 2015-08-09 – 2015-08-11 (×3): 20 mg via ORAL
  Filled 2015-08-09 (×3): qty 1

## 2015-08-09 MED ORDER — SODIUM CHLORIDE 0.9% FLUSH
3.0000 mL | Freq: Two times a day (BID) | INTRAVENOUS | Status: DC
  Administered 2015-08-09 – 2015-08-11 (×4): 3 mL via INTRAVENOUS

## 2015-08-09 MED ORDER — FUROSEMIDE 10 MG/ML IJ SOLN
40.0000 mg | Freq: Once | INTRAMUSCULAR | Status: AC
  Administered 2015-08-09: 40 mg via INTRAVENOUS
  Filled 2015-08-09: qty 4

## 2015-08-09 MED ORDER — IPRATROPIUM-ALBUTEROL 0.5-2.5 (3) MG/3ML IN SOLN
3.0000 mL | Freq: Once | RESPIRATORY_TRACT | Status: AC
  Administered 2015-08-09: 3 mL via RESPIRATORY_TRACT
  Filled 2015-08-09: qty 3

## 2015-08-09 MED ORDER — TICAGRELOR 90 MG PO TABS
90.0000 mg | ORAL_TABLET | Freq: Two times a day (BID) | ORAL | Status: DC
  Administered 2015-08-09: 90 mg via ORAL
  Filled 2015-08-09 (×3): qty 1

## 2015-08-09 MED ORDER — ATORVASTATIN CALCIUM 20 MG PO TABS
20.0000 mg | ORAL_TABLET | Freq: Every day | ORAL | Status: DC
  Administered 2015-08-09 – 2015-08-10 (×2): 20 mg via ORAL
  Filled 2015-08-09 (×2): qty 1

## 2015-08-09 MED ORDER — POLYVINYL ALCOHOL 1.4 % OP SOLN
1.0000 [drp] | Freq: Four times a day (QID) | OPHTHALMIC | Status: DC | PRN
  Filled 2015-08-09: qty 15

## 2015-08-09 MED ORDER — ASPIRIN EC 81 MG PO TBEC
81.0000 mg | DELAYED_RELEASE_TABLET | Freq: Every day | ORAL | Status: DC
  Administered 2015-08-09 – 2015-08-11 (×3): 81 mg via ORAL
  Filled 2015-08-09 (×3): qty 1

## 2015-08-09 MED ORDER — BISOPROLOL FUMARATE 5 MG PO TABS
2.5000 mg | ORAL_TABLET | Freq: Every day | ORAL | Status: DC
  Administered 2015-08-09 – 2015-08-11 (×3): 2.5 mg via ORAL
  Filled 2015-08-09 (×3): qty 0.5

## 2015-08-09 MED ORDER — ACETAMINOPHEN 325 MG PO TABS
650.0000 mg | ORAL_TABLET | Freq: Four times a day (QID) | ORAL | Status: DC | PRN
  Administered 2015-08-09: 650 mg via ORAL
  Filled 2015-08-09: qty 2

## 2015-08-09 MED ORDER — ISOSORBIDE MONONITRATE ER 30 MG PO TB24
30.0000 mg | ORAL_TABLET | Freq: Every day | ORAL | Status: DC
  Administered 2015-08-10 – 2015-08-11 (×2): 30 mg via ORAL
  Filled 2015-08-09 (×2): qty 1

## 2015-08-09 MED ORDER — HYPROMELLOSE (GONIOSCOPIC) 2.5 % OP SOLN: 1.0000 [drp] | Freq: Four times a day (QID) | OPHTHALMIC | Status: DC | PRN

## 2015-08-09 MED ORDER — FERROUS SULFATE 325 (65 FE) MG PO TABS
325.0000 mg | ORAL_TABLET | Freq: Every day | ORAL | Status: DC
  Administered 2015-08-10 – 2015-08-11 (×2): 325 mg via ORAL
  Filled 2015-08-09 (×2): qty 1

## 2015-08-09 MED ORDER — SODIUM CHLORIDE 0.9% FLUSH: 3.0000 mL | INTRAVENOUS | Status: DC | PRN

## 2015-08-09 MED ORDER — FUROSEMIDE 40 MG PO TABS
40.0000 mg | ORAL_TABLET | Freq: Every day | ORAL | Status: DC
  Administered 2015-08-10 – 2015-08-11 (×2): 40 mg via ORAL
  Filled 2015-08-09 (×2): qty 1

## 2015-08-09 NOTE — ED Notes (Signed)
Pt reports to the ED for eval of SOB with minimal exertion x since yesterday. She reports it was worse today. Reports burning sensation in her back but denies any CP. Reports occasional dry cough as well. 3 lead en route unremarkable. Lung sounds clear. Hx of COPD, CHF, and stent placement. Pt A&O, resp e/u, and skin warm and dry.

## 2015-08-09 NOTE — Progress Notes (Signed)
Patient stated she gave her money to her sister Eilene Ghazi and stated she looked for her eyeglasses everywhere and believes she left them at home on the table.

## 2015-08-09 NOTE — ED Provider Notes (Signed)
CSN: [id number]     Arrival date & time 08/09/15  [id number] History   First MD Initiated Contact with Patient 08/09/15 [phone]     Chief Complaint  Patient presents with  . Shortness of Breath     HPI Pt was seen at 0950.  Per pt, c/o gradual onset and worsening of persistent SOB for the past 2 days. States her SOB worsens with exertion. Has been associated with occasional non-productive cough. States she "sometimes" uses her MDI and "it helps sometimes." Denies CP/palpitations, no back pain, no abd pain, no N/V/D, no fevers, no rash.     Past Medical History  Diagnosis Date  . Anemia   . Anxiety   . Arthritis   . GERD (gastroesophageal reflux disease) 2009    EGD with benign gastric polyp too  . Hyperlipidemia   . Hypertension   . Vitamin D deficiency   . Colon polyp     2009 colonoscopy, not retrieved for pathology  . Hiatal hernia   . Diverticulosis 2009  . Aortic insufficiency     mild  . Stroke ([place]) 1975  . CKD (chronic kidney disease) stage 3, GFR 30-59 ml/min   . CHF (congestive heart failure) ([place])   . CAD (coronary artery disease)     cath 08/09/2014 95% stenosis in prox to mid RCA s/p DES, 80-90% prox OM2, 50% distal LAD  . COPD (chronic obstructive pulmonary disease) ([place])   . Ectropion of left lower eyelid    Past Surgical History  Procedure Laterality Date  . Appendectomy    . Abdominal hysterectomy    . Artery biopsy Left 12/16/2012    Procedure: BIOPSY TEMPORAL ARTERY;  Surgeon: Mal Misty, MD;  Location: [place];  Service: Vascular;  Laterality: Left;  . Left heart catheterization with coronary angiogram N/A 08/09/2014    Procedure: LEFT HEART CATHETERIZATION WITH CORONARY ANGIOGRAM;  Surgeon: Peter M Martinique, MD;  Location: [facility] CATH LAB;  Service: Cardiovascular;  Laterality: N/A;  . Percutaneous coronary stent intervention (pci-s)  08/09/2014    Procedure: PERCUTANEOUS CORONARY STENT INTERVENTION (PCI-S);  Surgeon: Peter M Martinique, MD;  Location: [facility] CATH LAB;  Service:  Cardiovascular;;   Family History  Problem Relation Age of Onset  . Stroke Mother   . Hypertension Mother   . Stroke Father   . Hypertension Father   . Diabetes Sister    Social History  Substance Use Topics  . Smoking status: Former Smoker    Types: Cigarettes    Quit date: 07/02/1979  . Smokeless tobacco: Former Systems developer  . Alcohol Use: No    Review of Systems ROS: Statement: All systems negative except as marked or noted in the HPI; Constitutional: Negative for fever and chills. ; ; Eyes: Negative for eye pain, redness and discharge. ; ; ENMT: Negative for ear pain, hoarseness, nasal congestion, sinus pressure and sore throat. ; ; Cardiovascular: Negative for chest pain, palpitations, diaphoresis, and peripheral edema. ; ; Respiratory: +SOB, cough. Negative for wheezing and stridor. ; ; Gastrointestinal: Negative for nausea, vomiting, diarrhea, abdominal pain, blood in stool, hematemesis, jaundice and rectal bleeding. . ; ; Genitourinary: Negative for dysuria, flank pain and hematuria. ; ; Musculoskeletal: Negative for back pain and neck pain. Negative for swelling and trauma.; ; Skin: Negative for pruritus, rash, abrasions, blisters, bruising and skin lesion.; ; Neuro: Negative for headache, lightheadedness and neck stiffness. Negative for weakness, altered level of consciousness , altered mental status, extremity weakness, paresthesias, involuntary movement, seizure and  syncope.      Allergies  Review of patient's allergies indicates no known allergies.  Home Medications   Prior to Admission medications   Medication Sig Start Date End Date Taking? Authorizing Provider  acetaminophen (TYLENOL) 325 MG tablet Take 650 mg by mouth every 6 (six) hours as needed for mild pain.    Historical Provider, MD  albuterol (PROVENTIL HFA;VENTOLIN HFA) 108 (90 BASE) MCG/ACT inhaler Inhale 2 puffs into the lungs every 4 (four) hours as needed for wheezing or shortness of breath. 08/05/14   Luan Moore, MD  aspirin EC 81 MG tablet Take 81 mg by mouth daily.    Historical Provider, MD  atorvastatin (LIPITOR) 20 MG tablet Take 1 tablet (20 mg total) by mouth daily at 6 PM. Patient not taking: Reported on 07/18/2015 08/10/14   Francesca Oman, DO  bisoprolol (ZEBETA) 5 MG tablet Take 0.5 tablets (2.5 mg total) by mouth daily. 10/17/14   Tasrif Ahmed, MD  cyanocobalamin (,VITAMIN B-12,) 1000 MCG/ML injection Inject 1 mL (1,000 mcg total) into the muscle every 30 (thirty) days. 12/08/14   Milagros Loll, MD  ferrous sulfate 325 (65 FE) MG tablet Take 325 mg by mouth daily with breakfast.    Historical Provider, MD  furosemide (LASIX) 40 MG tablet Take 1 tablet (40 mg total) by mouth daily. 10/17/14   Dellia Nims, MD  hydroxypropyl methylcellulose / hypromellose (ISOPTO TEARS / GONIOVISC) 2.5 % ophthalmic solution Place 1 drop into the left eye 4 (four) times daily as needed for dry eyes. Patient not taking: Reported on 07/18/2015 04/12/14   Noland Fordyce, PA-C  isosorbide mononitrate (IMDUR) 30 MG 24 hr tablet take 1 tablet by mouth once daily 06/01/15   Milagros Loll, MD  losartan (COZAAR) 50 MG tablet Take 1 tablet (50 mg total) by mouth daily. Patient taking differently: Take 50 mg by mouth daily. 1.5 tab 12/08/14   Milagros Loll, MD  pantoprazole (PROTONIX) 20 MG tablet take 1 tablet by mouth once daily 05/21/15   Milagros Loll, MD  potassium chloride SA (K-DUR,KLOR-CON) 20 MEQ tablet take 1 tablet by mouth once daily 08/03/14   Luan Moore, MD  ticagrelor (BRILINTA) 90 MG TABS tablet Take 1 tablet (90 mg total) by mouth 2 (two) times daily. 06/08/15   Milagros Loll, MD   BP 139/56 mmHg  Pulse 79  Temp(Src) 98.6 F (37 C) (Oral)  Resp 16  SpO2 100% Physical Exam  0955: Physical examination:  Nursing notes reviewed; Vital signs and O2 SAT reviewed;  Constitutional: Well developed, Well nourished, Well hydrated, In no acute distress; Head:  Normocephalic, atraumatic; Eyes: EOMI, PERRL,  No scleral icterus; ENMT: Mouth and pharynx normal, Mucous membranes moist; Neck: Supple, Full range of motion, No lymphadenopathy; Cardiovascular: Regular rate and rhythm, No gallop; Respiratory: Breath sounds diminished & equal bilaterally, No wheezes. Speaking full sentences with ease, Normal respiratory effort/excursion; Chest: Nontender, Movement normal; Abdomen: Soft, Nontender, Nondistended, Normal bowel sounds; Genitourinary: No CVA tenderness; Extremities: Pulses normal, No tenderness, No edema, No calf edema or asymmetry.; Neuro: AA&Ox3, vague historian, +HOH, otherwise major CN grossly intact.  Speech clear. No gross focal motor deficits in extremities.; Skin: Color normal, Warm, Dry.   ED Course  Procedures (including critical care time) Labs Review  Imaging Review  I have personally reviewed and evaluated these images and lab results as part of my medical decision-making.   EKG Interpretation   Date/Time:  Wednesday August 09 2015 10:56:57 EST  Ventricular Rate:  59 PR Interval:  145 QRS Duration: 102 QT Interval:  447 QTC Calculation: 443 R Axis:   7 Text Interpretation:  Sinus rhythm Low voltage, precordial leads Abnormal  R-wave progression, early transition When compared with ECG of 02/05/2015 No  significant change was found Confirmed by [facility]  MD, Nunzio Cory [phone]) on  08/09/2015 11:27:35 AM      MDM  MDM Reviewed: previous chart, nursing note and vitals Reviewed previous: labs and ECG Interpretation: labs, ECG and x-ray      Results for orders placed or performed during the hospital encounter of [id number]  Basic metabolic panel  Result Value Ref Range   Sodium 145 135 - 145 mmol/L   Potassium 4.0 3.5 - 5.1 mmol/L   Chloride 114 (H) 101 - 111 mmol/L   CO2 20 (L) 22 - 32 mmol/L   Glucose, Bld 90 65 - 99 mg/dL   BUN 37 (H) 6 - 20 mg/dL   Creatinine, Ser 1.19 (H) 0.44 - 1.00 mg/dL   Calcium 9.1 8.9 - 10.3 mg/dL   GFR calc non Af Amer 41 (L) >60 mL/min    GFR calc Af Amer 47 (L) >60 mL/min   Anion gap 11 5 - 15  CBC  Result Value Ref Range   WBC 3.4 (L) 4.0 - 10.5 K/uL   RBC 3.05 (L) 3.87 - 5.11 MIL/uL   Hemoglobin 9.7 (L) 12.0 - 15.0 g/dL   HCT 28.9 (L) 36.0 - 46.0 %   MCV 94.8 78.0 - 100.0 fL   MCH 31.8 26.0 - 34.0 pg   MCHC 33.6 30.0 - 36.0 g/dL   RDW 14.0 11.5 - 15.5 %   Platelets 121 (L) 150 - 400 K/uL  Brain natriuretic peptide  Result Value Ref Range   B Natriuretic Peptide 301.9 (H) 0.0 - 100.0 pg/mL  I-stat troponin, ED (not at [facility], [facility])  Result Value Ref Range   Troponin i, poc 0.02 0.00 - 0.08 ng/mL   Comment 3           Dg Chest 2 View 08/09/2015  CLINICAL DATA:  Shortness of breath with minimal exertion since yesterday, worse today, occasional dry cough, history COPD, CHF, GERD, hypertension, coronary artery disease post stent placement EXAM: CHEST  2 VIEW COMPARISON:  03/29/2015 FINDINGS: Upper normal heart size with slight pulmonary vascular congestion. Normal mediastinal contours. Lungs appear emphysematous but clear. No pleural effusion or pneumothorax. Bones demineralized. IMPRESSION: Slight pulmonary vascular congestion. Emphysematous changes without acute infiltrate. Electronically Signed   By: Lavonia Dana M.D.   On: 08/09/2015 10:54    1320:  H/H and BUN/Cr per baseline. Short neb given on arrival. Pt ambulated approx 31ft: pt's HR increased to 130's, RR to 30's and pt c/o increasing SOB; Sats remained 97% R/A. Pt escorted back to stretcher, HR and RR improved. BNP elevated from baseline with mild CHF on CXR; will dose IV lasix. T/C to IM Resident, case discussed, including:  HPI, pertinent PM/SHx, VS/PE, dx testing, ED course and treatment:  Agreeable to come to ED for evaluation to admit.  Francine Graven, DO 08/13/15 2114

## 2015-08-09 NOTE — Evaluation (Signed)
Physical Therapy Evaluation and Discharge Patient Details Name: Jane [name] MRN: [id number] DOB: 1928-09-18 Today's Date: 08/09/2015   History of Present Illness  Jane [name] is an 82 year old woman with PMH of CAD s/p NSTEMI in 08/2014 s/p DES stent in RCA and is on ASA and brillanta since then, CHF? With EF 60%, history of presumed COPD, HTN, vitamin D deficiency, CKD3, anaemia, anxiety and GERD. Admitted for dyspnea on exertion.  Clinical Impression  Patient evaluated by Physical Therapy with no further acute PT needs identified. All education has been completed and the patient has no further questions. Reports she does not leave her apartment much. She demonstrates general deconditioning, requiring several standing breaks to complete 140 foot ambulatory bout with poor posture. Minimal dyspnea with SpO2 97% on room air, HR to 110 (89 at rest.) Complains of pain in right clavicular area, which was tender to palpation over right pectoralis minor. Educated on stretch and cross-friction massage which she reports improved symptoms. Would benefit from HHPT follow-up. PT is signing off. Thank you for this referral.     Follow Up Recommendations Home health PT;Supervision - Intermittent (for deconditioning)    Equipment Recommendations  None recommended by PT    Recommendations for Other Services       Precautions / Restrictions Restrictions Weight Bearing Restrictions: No      Mobility  Bed Mobility Overal bed mobility: Modified Independent             General bed mobility comments: extra time  Transfers Overall transfer level: Modified independent Equipment used: Rolling walker (2 wheeled)             General transfer comment: Use of RW once upright  Ambulation/Gait Ambulation/Gait assistance: Supervision Ambulation Distance (Feet): 140 Feet Assistive device: Rolling walker (2 wheeled) Gait Pattern/deviations: Step-through pattern;Decreased stride length;Trunk  flexed Gait velocity: decreased Gait velocity interpretation: Below normal speed for age/gender General Gait Details: Required frequent cues for upright posture. 2/4 dyspnea, SpO2 97% on room air during bout. No overt loss of balance noted, min use of RW for support.  Required multiple standing rest breaks to complete distance.  Stairs            Wheelchair Mobility    Modified Rankin (Stroke Patients Only)       Balance Overall balance assessment: Needs assistance Sitting-balance support: No upper extremity supported;Feet supported Sitting balance-Leahy Scale: Normal     Standing balance support: No upper extremity supported Standing balance-Leahy Scale: Fair                               Pertinent Vitals/Pain Pain Assessment: 0-10 Pain Score: 6  Pain Location: Rt side of neck over clavicle Pain Descriptors / Indicators: Aching Pain Intervention(s): Monitored during session;Repositioned    Home Living Family/patient expects to be discharged to:: Private residence Living Arrangements: Alone Available Help at Discharge: Family ("occasionally") Type of Home: Apartment Home Access: Elevator     Home Layout: One level Home Equipment: Tub bench;Walker - 2 wheels;Shower seat      Prior Function Level of Independence: Needs assistance   Gait / Transfers Assistance Needed: Uses RW at times in the apartment  ADL's / Homemaking Assistance Needed: Has an aide PRN, states she calls when she needs aide to do laundry and food.        Hand Dominance   Dominant Hand: Right    Extremity/Trunk Assessment  Upper Extremity Assessment: Defer to OT evaluation           Lower Extremity Assessment: Generalized weakness         Communication   Communication: No difficulties  Cognition Arousal/Alertness: Awake/alert Behavior During Therapy: WFL for tasks assessed/performed Overall Cognitive Status: Within Functional Limits for tasks assessed                       General Comments General comments (skin integrity, edema, etc.): Complains of achy pain around clavicle, pain  with palpation along pectoralis minor which improved with crossfriction massage and pec stretch which she was educated to perform on her own.    Exercises Other Exercises Other Exercises: Pectoralis minor stretch x2 x 30 seconds each      Assessment/Plan    PT Assessment Patent does not need any further PT services  PT Diagnosis Abnormality of gait;Generalized weakness   PT Problem List    PT Treatment Interventions     PT Goals (Current goals can be found in the Care Plan section) Acute Rehab PT Goals Patient Stated Goal: Feel better PT Goal Formulation: All assessment and education complete, DC therapy    Frequency     Barriers to discharge        Co-evaluation               End of Session   Activity Tolerance: Patient tolerated treatment well Patient left: in chair;with call bell/phone within reach;with chair alarm set      Functional Assessment Tool Used: clinical observation Functional Limitation: Mobility: Walking and moving around Mobility: Walking and Moving Around Current Status [id number]): At least 1 percent but less than 20 percent impaired, limited or restricted Mobility: Walking and Moving Around Goal Status [phone]): At least 1 percent but less than 20 percent impaired, limited or restricted Mobility: Walking and Moving Around Discharge Status [phone]): At least 1 percent but less than 20 percent impaired, limited or restricted    Time: 1747-1817 PT Time Calculation (min) (ACUTE ONLY): 30 min   Charges:   PT Evaluation $PT Eval Low Complexity: 1 Procedure PT Treatments $Gait Training: 8-22 mins   PT G Codes:   PT G-Codes **NOT FOR INPATIENT CLASS** Functional Assessment Tool Used: clinical observation Functional Limitation: Mobility: Walking and moving around Mobility: Walking and Moving Around Current Status  [id number]): At least 1 percent but less than 20 percent impaired, limited or restricted Mobility: Walking and Moving Around Goal Status [phone]): At least 1 percent but less than 20 percent impaired, limited or restricted Mobility: Walking and Moving Around Discharge Status [phone]): At least 1 percent but less than 20 percent impaired, limited or restricted    Ellouise Newer 08/09/2015, 6:30 PM  Camille Bal [place], [place]

## 2015-08-09 NOTE — ED Notes (Signed)
Ambulated patient without supplemental oxygen Starting: SpO2 98% Heart Rate 88 bpm Resp Rate 18 Ambulated approximately 20 ft SpO2 97% Heart Rate 135 bpm Resp Rate 30 Patient couldn't continue and was returned to her room via wheelchair.

## 2015-08-09 NOTE — ED Notes (Signed)
Attempted report 

## 2015-08-09 NOTE — ED Notes (Addendum)
MD at bedside. Admitting MD.

## 2015-08-09 NOTE — ED Notes (Signed)
Pt to xray
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
## 2015-08-10 ENCOUNTER — Observation Stay (HOSPITAL_COMMUNITY): Payer: Medicare Other

## 2015-08-10 DIAGNOSIS — Z955 Presence of coronary angioplasty implant and graft: Secondary | ICD-10-CM

## 2015-08-10 DIAGNOSIS — I130 Hypertensive heart and chronic kidney disease with heart failure and stage 1 through stage 4 chronic kidney disease, or unspecified chronic kidney disease: Secondary | ICD-10-CM

## 2015-08-10 DIAGNOSIS — I2510 Atherosclerotic heart disease of native coronary artery without angina pectoris: Secondary | ICD-10-CM | POA: Diagnosis not present

## 2015-08-10 DIAGNOSIS — R000 Tachycardia, unspecified: Secondary | ICD-10-CM

## 2015-08-10 DIAGNOSIS — I252 Old myocardial infarction: Secondary | ICD-10-CM

## 2015-08-10 DIAGNOSIS — I5032 Chronic diastolic (congestive) heart failure: Secondary | ICD-10-CM

## 2015-08-10 DIAGNOSIS — R0600 Dyspnea, unspecified: Secondary | ICD-10-CM | POA: Diagnosis not present

## 2015-08-10 DIAGNOSIS — N183 Chronic kidney disease, stage 3 (moderate): Secondary | ICD-10-CM

## 2015-08-10 DIAGNOSIS — R0609 Other forms of dyspnea: Secondary | ICD-10-CM

## 2015-08-10 MED ORDER — TICAGRELOR 90 MG PO TABS
90.0000 mg | ORAL_TABLET | Freq: Two times a day (BID) | ORAL | Status: AC
  Administered 2015-08-10 (×2): 90 mg via ORAL
  Filled 2015-08-10 (×2): qty 1

## 2015-08-10 NOTE — Progress Notes (Signed)
SATURATION QUALIFICATIONS: (This note is used to comply with regulatory documentation for home oxygen)  Patient Saturations on Room Air at Rest =  100%  Patient Saturations on Room Air while Ambulating = 99%  Patient Saturations on 0 Liters of oxygen while Ambulating = 0%  Please briefly explain why patient needs home oxygen:

## 2015-08-10 NOTE — Progress Notes (Signed)
  Echocardiogram 2D Echocardiogram has been performed.  Jennette Dubin 08/10/2015, 12:43 PM

## 2015-08-10 NOTE — Progress Notes (Signed)
PT Note  Patient Details Name: Jane [name] MRN: [id number] DOB: 12/19/1928   Updated Note:    Reason Eval/Treat Not Completed: Other (comment) New physical therapy evaluation order acknowledged. Recommendation is for home health PT at discharge. No acute PT needs at this time. Pt adequate for d/c from PT standpoint when medically ready.   Jane [name] 08/10/2015, 3:03 PM  Camille Bal [place], [place]

## 2015-08-10 NOTE — Evaluation (Signed)
Occupational Therapy Evaluation Patient Details Name: Jane [name] MRN: [id number] DOB: 30-Jul-1928 Today's Date: 08/10/2015    History of Present Illness Jane [name] is an 82 year old woman with PMH of CAD s/p NSTEMI in 08/2014 s/p DES stent in RCA and is on ASA and brillanta since then, CHF? With EF 60%, history of presumed COPD, HTN, vitamin D deficiency, CKD3, anaemia, anxiety and GERD. Admitted for dyspnea on exertion.   Clinical Impression   Jane [name] currently needs min guard assist to supervision for functional transfers and selfcare tasks sit to stand.  Would recommend continued acute care OT as well as HHOT to further increase independence to modified independent level for return home.      Follow Up Recommendations  Home health OT;Supervision - Intermittent    Equipment Recommendations  None recommended by OT       Precautions / Restrictions Precautions Precautions: Fall Restrictions Weight Bearing Restrictions: No      Mobility Bed Mobility Overal bed mobility: Needs Assistance Bed Mobility: Supine to Sit     Supine to sit: Supervision        Transfers Overall transfer level: Needs assistance Equipment used: Rolling walker (2 wheeled) Transfers: Sit to/from [facility] Sit to Stand: Min guard Stand pivot transfers: Min guard       General transfer comment: Jane [name] with initial need for two attempts to complete sit to stand from the EOB.      Balance Overall balance assessment: Needs assistance   Sitting balance-Leahy Scale: Good       Standing balance-Leahy Scale: Fair Standing balance comment: Jane [name] with need for UE support for mobility and standing balance.                            ADL Overall ADL's : Needs assistance/impaired Eating/Feeding: Independent;Sitting   Grooming: Wash/dry hands;Wash/dry face;Supervision/safety;Sitting   Upper Body Bathing: Supervision/ safety;Sitting   Lower Body Bathing: Min guard;Sit  to/from stand       Lower Body Dressing: Min guard;Sit to/from stand   Toilet Transfer: Min guard;Ambulation;RW   Toileting- Water quality scientist and Hygiene: Min guard;Sit to/from stand       Functional mobility during ADLs: Min guard General ADL Comments: Jane [name] reports having a tub bench but she does not use it at this time and instead just sponge bathes.  She calls maintenance to move the chair out of the tub for her so she can sit on it to bathe at the sink.  She relies on her sister to help with groceries as needed and has a neighbor assist with washing gowns when needed.       Vision Vision Assessment?: No apparent visual deficits   Perception Perception Perception Tested?: No   Praxis Praxis Praxis tested?: Not tested    Pertinent Vitals/Pain Pain Assessment: No/denies pain Faces Pain Scale: Hurts a little bit Pain Location: right side of neck and clavicle Pain Intervention(s): Monitored during session;Repositioned     Hand Dominance Right   Extremity/Trunk Assessment Upper Extremity Assessment Upper Extremity Assessment: RUE deficits/detail RUE Deficits / Details: Jane [name] with limited AROM shoulder flexion 0-50 degrees.  Jane [name] with reports of ongoing right clavicular shoulder pain but has not been to the MD regarding it.  AROM for elbow flexion and extension WFLs, arthritic changes noted in the digits with hyper-extension noted in the IP joint of the thumb with decreased ability to flex the joint RUE Coordination: decreased fine motor  Lower Extremity Assessment Lower Extremity Assessment: Defer to Jane [name] evaluation   Cervical / Trunk Assessment Cervical / Trunk Assessment: Kyphotic   Communication Communication Communication: No difficulties   Cognition Arousal/Alertness: Awake/alert Behavior During Therapy: WFL for tasks assessed/performed Overall Cognitive Status: Within Functional Limits for tasks assessed                                Home Living  Family/patient expects to be discharged to:: Private residence Living Arrangements: Alone Available Help at Discharge: Family ("occasionally") Type of Home: Apartment Home Access: Elevator     Home Layout: One level     Bathroom Shower/Tub: Teacher, early years/pre: Standard (grab bar next to the toilet)     Home Equipment: Tub bench;Walker - 2 wheels;Shower seat          Prior Functioning/Environment Level of Independence: Needs assistance  Gait / Transfers Assistance Needed: Uses RW at times in the apartment ADL's / Homemaking Assistance Needed: Has an aide PRN, states she calls when she needs aide to do laundry and food.        OT Diagnosis: Generalized weakness;Acute pain   OT Problem List: Decreased strength;Impaired balance (sitting and/or standing);Decreased activity tolerance;Pain   OT Treatment/Interventions: Self-care/ADL training;Neuromuscular education;DME and/or AE instruction;Therapeutic activities;Balance training;Patient/family education    OT Goals(Current goals can be found in the care plan section) Acute Rehab OT Goals Patient Stated Goal: None stated this session. OT Goal Formulation: With patient Time For Goal Achievement: 08/24/15 Potential to Achieve Goals: Good  OT Frequency: Min 2X/week   Barriers to D/C: Decreased caregiver support             End of Session Equipment Utilized During Treatment: Rolling walker Nurse Communication: Mobility status  Activity Tolerance: Patient tolerated treatment well Patient left: in chair;with call bell/phone within reach;with chair alarm set   Time: [phone] OT Time Calculation (min): 34 min Charges:  OT General Charges $OT Visit: 1 Procedure OT Evaluation $OT Eval Moderate Complexity: 1 Procedure OT Treatments $Self Care/Home Management : 8-22 mins G-Codes: OT G-codes **NOT FOR INPATIENT CLASS** Functional Assessment Tool Used: clinical judgement Functional Limitation: Self  care Self Care Current Status [id number]): At least 20 percent but less than 40 percent impaired, limited or restricted Self Care Goal Status [id number]): At least 1 percent but less than 20 percent impaired, limited or restricted  [place] OTR/L 08/10/2015, 9:33 AM

## 2015-08-10 NOTE — H&P (Signed)
Internal Medicine Attending Admission Note Date: 08/10/2015  Patient name: Jane [name] Medical record number: [id number] Date of birth: 09-04-1928 Age: 82 y.o. Gender: female  I saw and evaluated the patient. I reviewed the resident's note and I agree with the resident's findings and plan as documented in the resident's note.  Chief Complaint(s): Dyspnea on exertion 1 day.  History - key components related to admission:  Jane [name] is an 82 year old woman with a history of coronary artery disease status post a non-ST elevation myocardial infarction in February 2016 requiring a drug-eluting stent in the right coronary artery, presumed diastolic dysfunction with a left ventricular ejection fraction of 60%, hypertension, stage III chronic kidney disease, anxiety, and possible chronic obstructive pulmonary disease who presents with dyspnea on exertion. Jane [name] was in her usual state of health on the morning of admission until she had a rash to use the bathroom at which point she felt dyspneic. She called her sister who contacted EMS and brought her to the emergency department. She denied any chest pain, orthopnea, paroxysmal nocturnal dyspnea, or dizziness. Review of her vitals in the chart reveal that she is down approximately 6 pounds from her last cardiology visit. Because of her severe dyspnea on exertion at 20 feet she was admitted to the internal medicine teaching service for further evaluation and care.  In the emergency department she was given a dose of IV Lasix with a resultant 1.5 L diuresis. This morning she feels well at rest. Walking to the bathroom she remains dyspneic and nursing noted she was tachycardic as well. She is without other complaints.  Physical Exam - key components related to admission:  Filed Vitals:   08/10/15 0548 08/10/15 0922 08/10/15 1046 08/10/15 1524  BP:    126/53  Pulse:  68  68  Temp:    97.5 F (36.4 C)  TempSrc:    Oral  Resp:    19  Height:       Weight: 157 lb 13.6 oz (71.6 kg)     SpO2:  97% 99% 100%   Gen.: Well-developed, well-nourished, elderly woman sitting comfortably in a chair note acute distress. Heart: Regular rate and rhythm without murmurs, rubs, or gallops. Lungs: Clear to auscultation bilaterally without wheezes, rhonchi, or rales.  Lab results:  Basic Metabolic Panel:  Recent Labs  08/09/15 0955  NA 145  K 4.0  CL 114*  CO2 20*  GLUCOSE 90  BUN 37*  CREATININE 1.19*  CALCIUM 9.1   CBC:  Recent Labs  08/09/15 0955  WBC 3.4*  HGB 9.7*  HCT 28.9*  MCV 94.8  PLT 121*   Misc. Labs:  BNP 301.9  Imaging results:  Dg Chest 2 View  08/09/2015  CLINICAL DATA:  Shortness of breath with minimal exertion since yesterday, worse today, occasional dry cough, history COPD, CHF, GERD, hypertension, coronary artery disease post stent placement EXAM: CHEST  2 VIEW COMPARISON:  03/29/2015 FINDINGS: Upper normal heart size with slight pulmonary vascular congestion. Normal mediastinal contours. Lungs appear emphysematous but clear. No pleural effusion or pneumothorax. Bones demineralized. IMPRESSION: Slight pulmonary vascular congestion. Emphysematous changes without acute infiltrate. Electronically Signed   By: Lavonia Dana M.D.   On: 08/09/2015 10:54   Other results:  EKG: Normal sinus rhythm at 60 bpm, low voltage, normal axis, normal intervals, no significant Q waves, no LVH by voltage, early R wave progression, no ST or T-wave changes. Compared to the previous ECG on 03/29/2015 there has been normalization of  the axis and pseudonormalization of T waves in V1 through V3.  Assessment & Plan by Problem:  Jane [name] is an 82 year old woman with a history of coronary artery disease status post non-ST elevation MI in February 2016 requiring a drug-eluting stent in the right coronary artery, chronic diastolic heart failure, hypertension, stage III chronic kidney disease, anxiety, and presumed COPD who presents with an  episode of dyspnea while rushing to the bathroom. She is dyspneic with short distances of approximately 20 feet. She also became tachycardic during one of these trips. Although this likely represents deconditioning one cannot exclude worsening left ventricular function given her known coronary artery disease and the fact that she becomes tachycardic with exertion. She diuresed well with a single dose of IV Lasix and had no increased afterload upon admission that would exacerbate diastolic heart failure. At this point I feel she is euvolemic.  1) Dyspnea on exertion with tachycardia: We will obtain an echocardiogram to assess for a decrease in left ventricular function since 2015. If there is none her dyspnea on exertion is likely related to deconditioning. We will continue her current outpatient regimen.  2) Disposition: Assuming her left ventricular function is unchanged I anticipate she will be ready for discharge home tomorrow with home health physical therapy services. Follow-up will be in the Internal Medicine Center.

## 2015-08-10 NOTE — Care Management Obs Status (Signed)
[facility] NOTIFICATION   Patient Details  Name: Jane [name] MRN: [id number] Date of Birth: April 26, 1929   Medicare Observation Status Notification Given:  Yes    Sharin Mons, RN 08/10/2015, 11:11 AM

## 2015-08-10 NOTE — Progress Notes (Signed)
Subjective:  There were no acute events overnight. Patient comfortably eating breakfast. Says her dyspnea is much better than yesterday. She diuresed 1.5 L overnight. Says she was able to walk to and from bathroom without getting SOB  Objective: Vital signs in last 24 hours: Filed Vitals:   08/09/15 2116 08/10/15 0548 08/10/15 0922 08/10/15 1046  BP: 141/53     Pulse: 64  68   Temp:      TempSrc:      Resp:      Height:      Weight:  157 lb 13.6 oz (71.6 kg)    SpO2:   97% 99%   Weight change:   Intake/Output Summary (Last 24 hours) at 08/10/15 1335 Last data filed at 08/10/15 1046  Gross per 24 hour  Intake    220 ml  Output   2250 ml  Net  -2030 ml   General: Vital signs reviewed. Patient in no acute distress and sitting HEENT:  left lower eye lid red Cardiovascular: regular rate, rhythm, no murmur appreciated Pulmonary/Chest: Clear to auscultation bilaterally, no wheezes, or crackles. Abdominal: Soft, non-tender, non-distended, BS + Extremities: no pitting edema   Lab Results: No results found for this or any previous visit (from the past 24 hour(s)).  Micro Results: No results found for this or any previous visit (from the past 240 hour(s)). Studies/Results: Dg Chest 2 View  08/09/2015  CLINICAL DATA:  Shortness of breath with minimal exertion since yesterday, worse today, occasional dry cough, history COPD, CHF, GERD, hypertension, coronary artery disease post stent placement EXAM: CHEST  2 VIEW COMPARISON:  03/29/2015 FINDINGS: Upper normal heart size with slight pulmonary vascular congestion. Normal mediastinal contours. Lungs appear emphysematous but clear. No pleural effusion or pneumothorax. Bones demineralized. IMPRESSION: Slight pulmonary vascular congestion. Emphysematous changes without acute infiltrate. Electronically Signed   By: Lavonia Dana M.D.   On: 08/09/2015 10:54   Medications: I have reviewed the patient's current medications. Scheduled  Meds: . aspirin EC  81 mg Oral Daily  . atorvastatin  20 mg Oral q1800  . bisoprolol  2.5 mg Oral Daily  . ferrous sulfate  325 mg Oral Q breakfast  . furosemide  40 mg Oral Daily  . isosorbide mononitrate  30 mg Oral Daily  . pantoprazole  20 mg Oral Daily  . sodium chloride flush  3 mL Intravenous Q12H  . ticagrelor  90 mg Oral BID   Continuous Infusions:  PRN Meds:.acetaminophen, albuterol, polyvinyl alcohol, sodium chloride flush Assessment/Plan: Principal Problem:   Dyspnea on exertion Active Problems:   Ectropion of left lower eyelid   Heart failure with preserved ejection fraction (HCC)   CAD (coronary artery disease)   CKD (chronic kidney disease) stage 3, GFR 30-59 ml/min  Dyspnea on exertion: Patient diuresed well- almost 1.5 L on the 40 mg IV lasix dose given yesterday. Her dyspnea is much improved. Her heart rate continues to increase she she ambulates even while O2 sats remaining normal, so she may have a mild exacerbation of her heart failure, so we are ordering an echo. PTOT recommends home health PTOT. Also, today is her last day of brillanta so that will be stopped as it can also cause dyspnea. She may be able to be discharged tomorrow or later today if echo normal.  -order echo -PT and OT --> home health PT and OT -I&O and daily weights -lasix 40 mg oral daily   -stop Brillanta after today   NSTEMI s/p DES -  continue aspirin, stop Brillanta with last dose being today at 2200  Presumed COPD: Her symptoms are more consistent with heart failure exacerbation  -albuterol PRN -outpatient PFTs   Dispo: Disposition is deferred at this time, awaiting improvement of current medical problems.  Anticipated discharge in approximately 1 day(s).   The patient does have a current PCP Milagros Loll, MD) and does need an [facility] hospital follow-up appointment after discharge.  The patient does not have transportation limitations that hinder transportation to clinic  appointments.  .Services Needed at time of discharge: Y = Yes, Blank = No PT:   OT:   RN:   Equipment:   Other:       Burgess Estelle, MD 08/10/2015, 1:35 PM

## 2015-08-10 NOTE — Progress Notes (Signed)
Chaplain presented to 5W Unit for routine spiritual care rounds.  The very pleasant elderly patient was sitting up in chair enjoying the view from her window. After initial introduction as Chaplain, time was spent offering the ministry of presence as the patient shared how her day was going.  A prayer of  healing and wholeness was offered for the patient., she appreciated the spiritual care support. Chaplain follow-up as needed will be provided. Chaplain Yaakov Guthrie [phone]

## 2015-08-11 DIAGNOSIS — R0609 Other forms of dyspnea: Secondary | ICD-10-CM | POA: Diagnosis not present

## 2015-08-11 DIAGNOSIS — R0600 Dyspnea, unspecified: Secondary | ICD-10-CM | POA: Diagnosis not present

## 2015-08-11 NOTE — Discharge Summary (Signed)
Name: Jane [name] MRN: [id number] DOB: 10/19/1928 82 y.o. PCP: Jane Loll, MD  Date of Admission: 08/09/2015  9:34 AM Date of Discharge: 08/12/2015 Attending Physician: No att. providers found  Discharge Diagnosis:  Principal Problem:   Dyspnea on exertion Active Problems:   Ectropion of left lower eyelid   Heart failure with preserved ejection fraction (HCC)   CAD (coronary artery disease)   CKD (chronic kidney disease) stage 3, GFR 30-59 ml/min  Discharge Medications:   Medication List    STOP taking these medications        ticagrelor 90 MG Tabs tablet  Commonly known as:  BRILINTA      TAKE these medications        acetaminophen 325 MG tablet  Commonly known as:  TYLENOL  Take 650 mg by mouth every 6 (six) hours as needed for mild pain.     albuterol 108 (90 Base) MCG/ACT inhaler  Commonly known as:  PROVENTIL HFA;VENTOLIN HFA  Inhale 2 puffs into the lungs every 4 (four) hours as needed for wheezing or shortness of breath.     aspirin EC 81 MG tablet  Take 81 mg by mouth daily.     atorvastatin 20 MG tablet  Commonly known as:  LIPITOR  Take 1 tablet (20 mg total) by mouth daily at 6 PM.     bisoprolol 5 MG tablet  Commonly known as:  ZEBETA  Take 0.5 tablets (2.5 mg total) by mouth daily.     cyanocobalamin 1000 MCG/ML injection  Commonly known as:  (VITAMIN B-12)  Inject 1 mL (1,000 mcg total) into the muscle every 30 (thirty) days.     ferrous sulfate 325 (65 FE) MG tablet  Take 325 mg by mouth daily with breakfast.     furosemide 40 MG tablet  Commonly known as:  LASIX  Take 1 tablet (40 mg total) by mouth daily.     hydroxypropyl methylcellulose / hypromellose 2.5 % ophthalmic solution  Commonly known as:  ISOPTO TEARS / GONIOVISC  Place 1 drop into the left eye 4 (four) times daily as needed for dry eyes.     isosorbide mononitrate 30 MG 24 hr tablet  Commonly known as:  IMDUR  take 1 tablet by mouth once daily     losartan 50  MG tablet  Commonly known as:  COZAAR  Take 1 tablet (50 mg total) by mouth daily.     pantoprazole 20 MG tablet  Commonly known as:  PROTONIX  take 1 tablet by mouth once daily     potassium chloride SA 20 MEQ tablet  Commonly known as:  K-DUR,KLOR-CON  take 1 tablet by mouth once daily        Disposition and follow-up:   Jane.Jane [name] was discharged from [facility] in Good condition.  At the hospital follow up visit please address:  Dyspnea on Exertion: DOE is likely secondary to deconditioning versus Brilinta use rather than an acute CHF exacerbation. We continued home Lasix 40 mg daily and ordered [facility] PT/OT.  Chronic Diastolic CHF: Echo revealed grade 1 diastolic dysfunction with EF 55-60% and mild to moderate aortic regurgitation. We continued her home medications.  CAD s/p NSTEMI and DES to RCA: On discharge, we continued her home ASA 81 mg daily, atorvastatin 20 mg daily, bisoprolol 2.5 mg daily, and Imdur 30 mg daily. Please make sure patient is not taking Brilinta (completed course). Please consider increasing to atorvastatin to 40 mg  daily as outpatient if patient can tolerate.  Possible COPD: No PFTs on file. Please order PFTs as outpatient.    2.  Labs / imaging needed at time of follow-up: PFTs  3.  Pending labs/ test needing follow-up: None  Follow-up Appointments: Follow-up Information    Follow up with Jane Groves, DO. Go on 08/18/2015.   Specialty:  Internal Medicine   Why:  hospital follow up 2:45 PM   Contact information:   [place] [address] [phone]       Discharge Instructions: Discharge Instructions    AMB Referral to Jane [name] Management    Complete by:  As directed   Reason for consult:  Post hospital follow up  Expected date of contact:  1-3 days (reserved for hospital discharges)  Please assign to community nurse for transition of care calls and assess for home visits. Patient lives at [facility]. Her sister lives in the same building. Questions please call:  Jane Brood, RN BSN [facility] Liaison  [phone] business mobile phone Toll free office [phone]     Diet - low sodium heart healthy    Complete by:  As directed      Increase activity slowly    Complete by:  As directed            Consultations:  None  Procedures Performed:  Dg Chest 2 View  08/09/2015  CLINICAL DATA:  Shortness of breath with minimal exertion since yesterday, worse today, occasional dry cough, history COPD, CHF, GERD, hypertension, coronary artery disease post stent placement EXAM: CHEST  2 VIEW COMPARISON:  03/29/2015 FINDINGS: Upper normal heart size with slight pulmonary vascular congestion. Normal mediastinal contours. Lungs appear emphysematous but clear. No pleural effusion or pneumothorax. Bones demineralized. IMPRESSION: Slight pulmonary vascular congestion. Emphysematous changes without acute infiltrate. Electronically Signed   By: Jane [name] M.D.   On: 08/09/2015 10:54    2D Echo:   Admission HPI: Jane [name] is an 82 year old woman with PMH of CAD s/p NSTEMI in 08/2014 s/p DES stent in RCA and is on ASA and brillanta since then, CHF? With EF 60%, history of presumed COPD, HTN, vitamin D deficiency, CKD3, anaemia, anxiety and GERD.  Patient says that she was in her usual state of health and was feeling fine this morning. But she had to rush to use the bathroom, and in that process, she started feeling dyspneic. Later she said she may have felt a little dyspnea yesterday but she is not sure. She then called her sister who called EMS and brought her to the ER. Patient denied any dizziness, lightheadedness, room spinning sensation. She did not fall and no LOC. She normally uses a walker. But she lives in a small apartment so she normally does not use walker at home. She says she usually can walk up and down her apt hallway without getting short of breath. She says  she only experiences dyspnea occasionally. Patient denies orthopnea or PND. She only uses one pillow at night. She denies any sick contacts. She denies any weight gain or weight loss. But looking at the vitals from Cardiology note, she is down about 6 pounds. She says her normal weight is around 155 pounds. She denies any cough. She uses her albuterol about twice a day and has not noticed any chance in the frequency. She is not exposed to overt smoke and she does not smoke. She says occasionally she may  smell smoke from other people in her apartment complex but it does not bother her.  Patient denies fevers, chills, night sweats or other constitutional symptoms. She denies leg swellings. She is normally able to do her activities of daily living like preparing her food and getting dressed without difficulty. She is also compliant with her lasix and Brillanta and aspirin. She is scheduled for an eye surgery but that is on hold, until she is finished with her Brillanta this month.   In the ER, she was able to ambulate 20 feet before becoming dyspneic although her sats remained at 97%, so they gave her IV lasix and admit to IM.  FH: stroke and HTN on both side SH: quit smoking 25 years ago, and quit alcohol 35 years ago. She lives by herself. Her family comes to see her only occassionally   Hospital Course by problem list: Principal Problem:   Dyspnea on exertion Active Problems:   Ectropion of left lower eyelid   Heart failure with preserved ejection fraction (HCC)   CAD (coronary artery disease)   CKD (chronic kidney disease) stage 3, GFR 30-59 ml/min   Dyspnea on Exertion: Patient presented with DOE after quickly ambulating to the bathroom. She was initially treated with IV lasix with good urine output then resumed on her home lasix 40 mg daily. Initial CXR showed slight pulmonary vascular congestion and BNP was elevated at 301.9. She did have JVD on examination; however, she appeared euvolemic on  exam as her lungs are clear and there was not evidence of lower extremity edema. Echo yesterday revealed grade 1 diastolic dysfunction with EF 55-60% and mild to moderate aortic regurgitation. DOE is likely secondary to deconditioning versus Brilinta use (although unable to state if DOE started before or after initiation of Brilinta) rather than an acute CHF exacerbation. PT and OT recommend [facility] PT/OT. Ambulatory sats showed patient remained 99% on room while ambulating. On day of discharge, patient was doing much better, back at baseline, and safe to discharge to home with follow up in our clinic in one week. We continued home Lasix 40 mg daily and ordered [facility] PT/OT.  Chronic Diastolic CHF: Echo revealed grade 1 diastolic dysfunction with EF 55-60% and mild to moderate aortic regurgitation. We continued her home Lasix 40 mg daily, bisoprolol 2.5 mg daily, and Imdur 30 mg daily on discharge.   CAD s/p NSTEMI and DES to RCA: Patient denies chest pain. 08/10/15 was her last day of Brilinta as it has been one year. On discharge, we continued her home ASA 81 mg daily, atorvastatin 20 mg daily, bisoprolol 2.5 mg daily, and Imdur 30 mg daily. Please make sure patient is not taking Brilinta. Please consider increasing to atorvastatin to 40 mg daily as outpatient if patient can tolerate.  Possible COPD: No PFTs on file. Please order PFTs as outpatient. We continued albuterol prn.   Discharge Vitals:   BP 150/53 mmHg  Pulse 69  Temp(Src) 97.5 F (36.4 C) (Oral)  Resp 16  Ht 5\' 4"  (1.626 m)  Wt 156 lb 1.4 oz (70.8 kg)  BMI 26.78 kg/m2  SpO2 98%  Signed: Osa Craver, DO PGY-2 Internal Medicine Resident Pager # [phone] 08/12/2015 12:38 PM   Services Ordered on Discharge: [facility] PT/OT Equipment Ordered on Discharge: None

## 2015-08-11 NOTE — Discharge Instructions (Signed)
·   Thank you for allowing [place] to be involved in your healthcare while you were hospitalized at [facility].   Please note that there have been changes to your home medications.  --> PLEASE LOOK AT YOUR DISCHARGE MEDICATION LIST FOR DETAILS.  Please call your PCP if you have any questions or concerns, or any difficulty getting any of your medications.  Please return to the ER if you have worsening of your symptoms or new severe symptoms arise.    STOP TAKING BRILINTA.   YOU HAVE RECEIVED ALL OF YOUR MEDICATIONS TODAY EXCEPT FOR YOUR ATORVASTATIN AND YOUR LOSARTAN. YOU CAN TAKE THESE TONIGHT WHEN YOU GET HOME.  PLEASE FOLLOW UP IN OUR CLINIC AS LISTED IN THE APPOINTMENTS SECTION.

## 2015-08-11 NOTE — Consult Note (Signed)
   [facility] CM Inpatient Consult   08/11/2015  Jane [name] 11-Dec-1928 [id number] Patient evaluated for community based chronic disease management services with [facility] as a benefit of patient's Medicare Insurance. Spoke with patient at bedside to explain [facility] Management services.  Consent form signed. Patient will receive post hospital discharge call and will be evaluated for monthly home visits for assessments and disease process education.  Left contact information and [facility] literature at bedside. Made Inpatient Case Manager aware that [place] Management following. Of note, [facility] Care Management services does not replace or interfere with any services that are arranged by inpatient case management or social work.  For additional questions or referrals please contact:   Natividad Brood, RN BSN [facility] Liaison  [phone] business mobile phone Toll free office [phone]

## 2015-08-11 NOTE — Progress Notes (Signed)
CSW consulted regarding transportation at discharge. Patient reports having no one to come get her and no money. CSW provided taxi voucher.  CSW signing off.  Percell Locus Nihar Klus LCSWA [phone]

## 2015-08-11 NOTE — Progress Notes (Signed)
Emmory Ivin Booty to be D/C'd to home with home health per MD order.  Discussed with the patient and all questions fully answered.  VSS, Skin clean, dry and intact without evidence of skin break down, no evidence of skin tears noted. IV catheter discontinued intact. Site without signs and symptoms of complications. Dressing and pressure applied.  An After Visit Summary was printed and given to the patient. No new prescriptions given.  D/c education completed with patient/family including follow up instructions, medication list, d/c activities limitations if indicated, with other d/c instructions as indicated by MD - patient able to verbalize understanding, all questions fully answered.   Patient instructed to return to ED, call 911, or call MD for any changes in condition.   Patient escorted via [place], and D/C home via private auto.  Lynann Beaver 08/11/2015 4:43 PM

## 2015-08-11 NOTE — Care Management Note (Signed)
Case Management Note  Patient Details  Name: NERINE STURMS MRN: [id number] Date of Birth: [birth year]  Subjective/Objective:                 Presents with sob from home alone. Independent with ADL's. States uses walker with ambulation. PCP: Jacques Earthly.    Action/Plan: Per PT's  Recommendation: HHPT.  Expected Discharge Date:                  Expected Discharge Plan:  Home/Self Care  In-House Referral:     Discharge planning Services  CM Consult  Post Acute Care Choice:    Choice offered to:  Patient  DME Arranged:    DME Agency:     HH Arranged:  PT [place]:  [facility]  Status of Service:  Completed, signed off  Medicare Important Message Given:    Date Medicare IM Given:    Medicare IM give by:    Date Additional Medicare IM Given:    Additional Medicare Important Message give by:     If discussed at [place] of Stay Meetings, dates discussed:    Additional Comments: CM spoke with pt regarding PT's recommendation:HHPT. Pt would like HHPT to be provided @ d/c. Choice provided to pt. Pt agreed with [facility] for home health PT. Referral made to [facility] for HHPT,.tentatively. Awaiting HHPT  order from MD.    Sharin Mons, RN,BSN,CM [phone] 08/11/2015, 2:50 PM

## 2015-08-11 NOTE — Progress Notes (Signed)
Subjective:  Patient was seen and examined this morning. No acute events overnight. She feels her shortness of breath is improved compared to when she first came to the hospital. That event was triggered by her moving too quickly to the bathroom causing shortness of breath which prompted her to call her daughter and her daughter to subsequently call EMS. She has a history of DOE but is unable to tell me if this started before or after starting Brilinta. She is not short of breath at rest. She states her albuterol inhaler improves her shortness of breath at times. She denies any chest pain.   Objective: Vital signs in last 24 hours: Filed Vitals:   08/10/15 1046 08/10/15 1524 08/10/15 2154 08/11/15 0550  BP:  126/53 115/51 150/53  Pulse:  68 68 69  Temp:  97.5 F (36.4 C) 97.9 F (36.6 C) 97.5 F (36.4 C)  TempSrc:  Oral Oral Oral  Resp:  19 18 16   Height:      Weight:    156 lb 1.4 oz (70.8 kg)  SpO2: 99% 100% 100% 98%   Weight change: 4.6 oz (0.13 kg)  Intake/Output Summary (Last 24 hours) at 08/11/15 1208 Last data filed at 08/11/15 1151  Gross per 24 hour  Intake    220 ml  Output    900 ml  Net   -680 ml   General: Vital signs reviewed.  Patient is elderly, in no acute distress and cooperative with exam.  Eyes: Ectropion on left.  Neck: +JVD on right.  Cardiovascular: RRR, S1 normal, S2 normal, no rubs or gallops. Unable to auscultate a diastolic murmur.  Pulmonary/Chest: Clear to auscultation bilaterally, no wheezes, rales, or rhonchi. Breaths unlabored, no accessory muscle use. Speaking in full sentences. Abdominal: Soft, non-tender, non-distended, obese, BS +.  Extremities: No lower extremity edema bilaterally, venous insufficiency skin changes.   Lab Results: Basic Metabolic Panel:  Recent Labs Lab 08/09/15 0955  NA 145  K 4.0  CL 114*  CO2 20*  GLUCOSE 90  BUN 37*  CREATININE 1.19*  CALCIUM 9.1   CBC:  Recent Labs Lab 08/09/15 0955  WBC 3.4*  HGB  9.7*  HCT 28.9*  MCV 94.8  PLT 121*   Medications:  I have reviewed the patient's current medications. Prior to Admission:  Prescriptions prior to admission  Medication Sig Dispense Refill Last Dose  . acetaminophen (TYLENOL) 325 MG tablet Take 650 mg by mouth every 6 (six) hours as needed for mild pain.   Past Week at Unknown time  . albuterol (PROVENTIL HFA;VENTOLIN HFA) 108 (90 BASE) MCG/ACT inhaler Inhale 2 puffs into the lungs every 4 (four) hours as needed for wheezing or shortness of breath. 1 Inhaler 3 08/09/2015 at Unknown time  . aspirin EC 81 MG tablet Take 81 mg by mouth daily.   08/08/2015 at Unknown time  . atorvastatin (LIPITOR) 20 MG tablet Take 1 tablet (20 mg total) by mouth daily at 6 PM. 30 tablet 3 08/08/2015 at Unknown time  . bisoprolol (ZEBETA) 5 MG tablet Take 0.5 tablets (2.5 mg total) by mouth daily. 90 tablet 1 08/08/2015 at Unknown time  . cyanocobalamin (,VITAMIN B-12,) 1000 MCG/ML injection Inject 1 mL (1,000 mcg total) into the muscle every 30 (thirty) days. 1 mL 0 Past Month at Unknown time  . ferrous sulfate 325 (65 FE) MG tablet Take 325 mg by mouth daily with breakfast.   08/08/2015 at Unknown time  . furosemide (LASIX) 40 MG tablet  Take 1 tablet (40 mg total) by mouth daily. 90 tablet 3 08/08/2015 at Unknown time  . hydroxypropyl methylcellulose / hypromellose (ISOPTO TEARS / GONIOVISC) 2.5 % ophthalmic solution Place 1 drop into the left eye 4 (four) times daily as needed for dry eyes. 15 mL 2 Past Week at Unknown time  . isosorbide mononitrate (IMDUR) 30 MG 24 hr tablet take 1 tablet by mouth once daily 30 tablet 3 08/08/2015 at Unknown time  . losartan (COZAAR) 50 MG tablet Take 1 tablet (50 mg total) by mouth daily. 30 tablet 3 08/08/2015 at Unknown time  . pantoprazole (PROTONIX) 20 MG tablet take 1 tablet by mouth once daily 30 tablet 6 08/08/2015 at Unknown time  . potassium chloride SA (K-DUR,KLOR-CON) 20 MEQ tablet take 1 tablet by mouth once daily 90 tablet 3  08/08/2015 at Unknown time  . ticagrelor (BRILINTA) 90 MG TABS tablet Take 1 tablet (90 mg total) by mouth 2 (two) times daily. 60 tablet 0 08/08/2015 at Unknown time   Scheduled Meds: . aspirin EC  81 mg Oral Daily  . atorvastatin  20 mg Oral q1800  . bisoprolol  2.5 mg Oral Daily  . ferrous sulfate  325 mg Oral Q breakfast  . furosemide  40 mg Oral Daily  . isosorbide mononitrate  30 mg Oral Daily  . pantoprazole  20 mg Oral Daily  . sodium chloride flush  3 mL Intravenous Q12H   Continuous Infusions:  PRN Meds:.acetaminophen, albuterol, polyvinyl alcohol, sodium chloride flush Assessment/Plan: Principal Problem:   Dyspnea on exertion Active Problems:   Ectropion of left lower eyelid   Heart failure with preserved ejection fraction (HCC)   CAD (coronary artery disease)   CKD (chronic kidney disease) stage 3, GFR 30-59 ml/min  82 yo female with PMHx of CAD s/p NSTEmI and DES to RCA, chronic diastolic CHF, HTN, CKD stage III, and presumed COPD presenting with dyspnea on exertion after ambulating to the bathroom.   Dyspnea on Exertion: Patient states shortness of breath has improved and denies shortness of breath at rest. She feels it was due to her ambulating too quickly. She was initially treated with IV lasix with good urine output then resumed on her home lasix 40 mg daily. Initial CXR showed slight pulmonary vascular congestion and BNP was elevated at 301.9. She does have JVD on examination; however, she appears euvolemic on exam as her lungs are clear and there is not evidence of lower extremity edema. Echo yesterday revealed grade 1 diastolic dysfunction with EF 55-60% and mild to moderate aortic regurgitation. DOE is likely secondary to deconditioning versus Brilinta use (although unable to state if DOE started before or after initiation of Brilinta) rather than an acute CHF exacerbation. PT and OT recommend [facility] PT/OT. Ambulatory sats yesterday showed patient remained 99% on room while  ambulating. Patient is doing much better, back at baseline, and safe to discharged to home today with follow up in our clinic in one week.  -Continue home Lasix 40 mg daily -HH PT/OT -Likely discharge home today -Stop Brilinta  Sinus Tachycardia: Improved. HR initially was in the 90s yesterday, but has improved overnight and remained in the 60-70s.  -Continue home bisoprolol 2.5 mg daily  Chronic Diastolic CHF: Echo yesterday revealed grade 1 diastolic dysfunction with EF 55-60% and mild to moderate aortic regurgitation. -Lasix 40 mg daily -Continue bisoprolol 2.5 mg daily -Continue Imdur 30 mg daily  CAD s/p NSTEMI and DES to RCA: Patient denies chest pain. Yesterday was  her last day of Brilinta as it has been one year.  -Continue home ASA 81 mg daily -Continue atorvastatin 20 mg daily, consider increasing to 40 mg daily as outpatient if patient can tolerate -Continue bisoprolol 2.5 mg daily -Continue Imdur 30 mg daily -Stop Brilinta  Possible COPD: No PFTs on file.  -PFTs as outpatient -Continue albuterol prn   DVT/PE ppx: SCDs  Dispo:  Anticipated discharge in approximately 0 day(s).   The patient does have a current PCP Milagros Loll, MD) and does need an [facility] hospital follow-up appointment after discharge.  The patient does have transportation limitations that hinder transportation to clinic appointments.  .Services Needed at time of discharge: Y = Yes, Blank = No PT:  HHPT  OT: HHOT  RN:   Equipment:   Other:       Osa Craver, DO PGY-2 Internal Medicine Resident Pager # [phone] 08/11/2015 12:08 PM

## 2015-08-11 NOTE — Progress Notes (Signed)
Occupational Therapy Treatment Patient Details Name: Jane [name] MRN: [id number] DOB: 10-Feb-1929 Today's Date: 08/11/2015    History of present illness Jane [name] is an 82 year old woman with PMH of CAD s/p NSTEMI in 08/2014 s/p DES stent in RCA and is on ASA and brillanta since then, CHF? With EF 60%, history of presumed COPD, HTN, vitamin D deficiency, CKD3, anaemia, anxiety and GERD. Admitted for dyspnea on exertion.   OT comments  Patient progressing slowly towards OT goals, continue plan of care for now. Pt continues to require min guard to min assist. Pt takes increased amount of time to complete tasks and needs RW for safety with mobility. Pt continuously states her apartment is small enough and that she doesn't need a RW there, worry about her safety because pt not able to ambulate within hospital room safely without RW - she required min assist.    Follow Up Recommendations  Home health OT;Supervision - Intermittent (Pt will benefit from [facility] aide )    Equipment Recommendations  None recommended by OT    Recommendations for Other Services  None at this time   Precautions / Restrictions Precautions Precautions: Fall Restrictions Weight Bearing Restrictions: No    Mobility Bed Mobility General bed mobility comments: pt found seated EOB upon OT entering room, left pt seated in recliner upon OT exiting room  Transfers Overall transfer level: Needs assistance Equipment used: Rolling walker (2 wheeled) Transfers: Sit to/from Stand Sit to Stand: Min guard General transfer comment: Pt stood with RW and without RW, min guard with RW and min assist without RW. Pt requires cues for safety and assistance for balance and safety.     Balance Overall balance assessment: Needs assistance Sitting-balance support: No upper extremity supported;Feet supported Sitting balance-Leahy Scale: Good     Standing balance support: No upper extremity supported;During functional  activity Standing balance-Leahy Scale: Fair Standing balance comment: Pt does better and is safer with use of RW for UE support during functional mobility    ADL Overall ADL's : Needs assistance/impaired Eating/Feeding: Sitting;Set up   Grooming: Min guard;Standing   Upper Body Bathing: Supervision/ safety;Sitting   Lower Body Bathing: Min guard;Sit to/from stand   Upper Body Dressing : Supervision/safety;Set up;Sitting   Lower Body Dressing: Min guard;Sit to/from stand   Toilet Transfer: Min guard;Ambulation;RW   Toileting- Water quality scientist and Hygiene: Min guard;Sit to/from stand       Functional mobility during ADLs: Min guard General ADL Comments: Pt reports that she does not use RW for functional ambulation and mobility in apartment. Had pt ambulate in hallway using RW for focus on overall activity tolerance/endurance and had pt attempt to ambulate in room without use of RW. Pt with difficulty ambulating without RW, pt with decreased balance, and pt tended to furniture walk when not using RW. Pt also told this patient that she prefers sponge bathing at this time. Pt able to don/doff right sock, but took more than a reasonable amount of time. Pt will benefit from a HHaide.     Perception Perception Perception Tested?: No   Praxis Praxis Praxis tested?: Not tested    Cognition   Behavior During Therapy: WFL for tasks assessed/performed Overall Cognitive Status: Within Functional Limits for tasks assessed                 Pertinent Vitals/ Pain       Pain Assessment: Faces Faces Pain Scale: Hurts little more Pain Location: across lower back Pain Descriptors /  Indicators: Aching Pain Intervention(s): Limited activity within patient's tolerance;Monitored during session;Repositioned   Frequency Min 2X/week     Progress Toward Goals  OT Goals(current goals can now befound in the care plan section)  Progress towards OT goals: Progressing toward goals  Acute  Rehab OT Goals Patient Stated Goal: None stated OT Goal Formulation: With patient Time For Goal Achievement: 08/24/15 Potential to Achieve Goals: Good  Plan Discharge plan remains appropriate    End of Session Equipment Utilized During Treatment: Rolling walker   Activity Tolerance Patient tolerated treatment well   Patient Left in chair;with call bell/phone within reach;with chair alarm set    Time: [phone] OT Time Calculation (min): 24 min  Charges: OT General Charges $OT Visit: 1 Procedure OT Treatments $Self Care/Home Management : 8-22 mins $Therapeutic Activity: 8-22 mins  Jane [name] , Jane, OTR/L, CLT Pager: [phone]  08/11/2015, 10:01 AM

## 2015-08-11 NOTE — Progress Notes (Signed)
Internal Medicine Attending  Date: 08/11/2015  Patient name: Jane [name] Medical record number: [id number] Date of birth: 31-May-1929 Age: 82 y.o. Gender: female  I saw and evaluated the patient. I reviewed the resident's note by Dr. Marvel Plan and I agree with the resident's findings and plans as documented in her progress note.  [name] feels well today and states her breathing is at baseline. An echocardiogram was stable and revealed no left ventricular dysfunction. She is stable for discharge home today and already has follow-up scheduled for one week in the Internal Medicine Center.

## 2015-08-13 DIAGNOSIS — I129 Hypertensive chronic kidney disease with stage 1 through stage 4 chronic kidney disease, or unspecified chronic kidney disease: Secondary | ICD-10-CM | POA: Diagnosis not present

## 2015-08-13 DIAGNOSIS — I5032 Chronic diastolic (congestive) heart failure: Secondary | ICD-10-CM | POA: Diagnosis not present

## 2015-08-13 DIAGNOSIS — J449 Chronic obstructive pulmonary disease, unspecified: Secondary | ICD-10-CM | POA: Diagnosis not present

## 2015-08-13 DIAGNOSIS — I252 Old myocardial infarction: Secondary | ICD-10-CM | POA: Diagnosis not present

## 2015-08-13 DIAGNOSIS — F419 Anxiety disorder, unspecified: Secondary | ICD-10-CM | POA: Diagnosis not present

## 2015-08-13 DIAGNOSIS — N183 Chronic kidney disease, stage 3 (moderate): Secondary | ICD-10-CM | POA: Diagnosis not present

## 2015-08-13 DIAGNOSIS — K219 Gastro-esophageal reflux disease without esophagitis: Secondary | ICD-10-CM | POA: Diagnosis not present

## 2015-08-13 DIAGNOSIS — I2510 Atherosclerotic heart disease of native coronary artery without angina pectoris: Secondary | ICD-10-CM | POA: Diagnosis not present

## 2015-08-14 ENCOUNTER — Other Ambulatory Visit: Payer: Self-pay

## 2015-08-14 ENCOUNTER — Other Ambulatory Visit: Payer: Self-pay | Admitting: *Deleted

## 2015-08-14 MED ORDER — ALBUTEROL SULFATE HFA 108 (90 BASE) MCG/ACT IN AERS: 2.0000 | INHALATION_SPRAY | RESPIRATORY_TRACT | Status: DC | PRN

## 2015-08-14 MED ORDER — FUROSEMIDE 40 MG PO TABS: 40.0000 mg | ORAL_TABLET | Freq: Every day | ORAL | Status: DC

## 2015-08-14 MED ORDER — ISOSORBIDE MONONITRATE ER 30 MG PO TB24: 30.0000 mg | ORAL_TABLET | Freq: Every day | ORAL | Status: DC

## 2015-08-14 NOTE — Patient Outreach (Signed)
Referral received from hospital liaison.  Member was recently admitted to hospital with congestive heart failure, discharged on Friday, 2/10.  According to chart, she also has a history of a stroke, coronary artery disease, COPD, and dementia.  Call placed to member, this care manager introduced self and explained purpose of call.  She recalls speaking to hospital liaison during hospitalization and agrees to be involved with [facility] care management services.    She states that she has been doing "fine" since discharged.  She denies questions regarding her discharge instructions, stating that she is taking all of her medications as prescribed, and has stopped taking her Brilinta.  She reports that she does weigh herself every day although she does not record her readings.  Member advised to record daily weights in effort to monitor her daily fluid status.  She states that she does not record it because she always remember and is aware of when to notify the physician.  She reports having home health visits, and states that she "just tell them what it is, they make me do it when they here too."  She is advised again to record daily readings, but state "they are always the same, between 154-155 pounds."    Member reports having a follow up appointment this Friday with her PCP, denies needing transportation.  She state that she usually has her nephew take her, stating that she does not like to use [employer] or the Medicaid transportation.  She is aware that this care manager will continue with weekly transition of care calls next week, initial home visit scheduled.  Contact information provided, encouraged to contact with any questions or concerns.  Valente David, BSN, [place] Management  [facility] Care Manager [phone]

## 2015-08-15 ENCOUNTER — Other Ambulatory Visit: Payer: Self-pay | Admitting: Internal Medicine

## 2015-08-15 DIAGNOSIS — I2510 Atherosclerotic heart disease of native coronary artery without angina pectoris: Secondary | ICD-10-CM | POA: Diagnosis not present

## 2015-08-15 DIAGNOSIS — J449 Chronic obstructive pulmonary disease, unspecified: Secondary | ICD-10-CM | POA: Diagnosis not present

## 2015-08-15 DIAGNOSIS — I5032 Chronic diastolic (congestive) heart failure: Secondary | ICD-10-CM | POA: Diagnosis not present

## 2015-08-15 DIAGNOSIS — N183 Chronic kidney disease, stage 3 (moderate): Secondary | ICD-10-CM | POA: Diagnosis not present

## 2015-08-15 DIAGNOSIS — I129 Hypertensive chronic kidney disease with stage 1 through stage 4 chronic kidney disease, or unspecified chronic kidney disease: Secondary | ICD-10-CM | POA: Diagnosis not present

## 2015-08-15 DIAGNOSIS — K219 Gastro-esophageal reflux disease without esophagitis: Secondary | ICD-10-CM | POA: Diagnosis not present

## 2015-08-16 ENCOUNTER — Telehealth: Payer: Self-pay | Admitting: Pulmonary Disease

## 2015-08-16 DIAGNOSIS — I129 Hypertensive chronic kidney disease with stage 1 through stage 4 chronic kidney disease, or unspecified chronic kidney disease: Secondary | ICD-10-CM | POA: Diagnosis not present

## 2015-08-16 DIAGNOSIS — J449 Chronic obstructive pulmonary disease, unspecified: Secondary | ICD-10-CM | POA: Diagnosis not present

## 2015-08-16 DIAGNOSIS — K219 Gastro-esophageal reflux disease without esophagitis: Secondary | ICD-10-CM | POA: Diagnosis not present

## 2015-08-16 DIAGNOSIS — N183 Chronic kidney disease, stage 3 (moderate): Secondary | ICD-10-CM | POA: Diagnosis not present

## 2015-08-16 DIAGNOSIS — I2510 Atherosclerotic heart disease of native coronary artery without angina pectoris: Secondary | ICD-10-CM | POA: Diagnosis not present

## 2015-08-16 DIAGNOSIS — I5032 Chronic diastolic (congestive) heart failure: Secondary | ICD-10-CM | POA: Diagnosis not present

## 2015-08-16 NOTE — Telephone Encounter (Signed)
OK for home health social work  Jacques Earthly, MD  Internal Medicine Teaching Service PGY-2

## 2015-08-16 NOTE — Telephone Encounter (Signed)
Vo given to [place].

## 2015-08-16 NOTE — Telephone Encounter (Signed)
Calling to get a verbal order for a home health social worker to visit with her to discuss her transportation and other needs.

## 2015-08-16 NOTE — Telephone Encounter (Signed)
Dr. Randell Patient- is VO OK for [facility] social work?

## 2015-08-17 DIAGNOSIS — I2510 Atherosclerotic heart disease of native coronary artery without angina pectoris: Secondary | ICD-10-CM | POA: Diagnosis not present

## 2015-08-17 DIAGNOSIS — N183 Chronic kidney disease, stage 3 (moderate): Secondary | ICD-10-CM | POA: Diagnosis not present

## 2015-08-17 DIAGNOSIS — J449 Chronic obstructive pulmonary disease, unspecified: Secondary | ICD-10-CM | POA: Diagnosis not present

## 2015-08-17 DIAGNOSIS — I5032 Chronic diastolic (congestive) heart failure: Secondary | ICD-10-CM | POA: Diagnosis not present

## 2015-08-17 DIAGNOSIS — K219 Gastro-esophageal reflux disease without esophagitis: Secondary | ICD-10-CM | POA: Diagnosis not present

## 2015-08-17 DIAGNOSIS — I129 Hypertensive chronic kidney disease with stage 1 through stage 4 chronic kidney disease, or unspecified chronic kidney disease: Secondary | ICD-10-CM | POA: Diagnosis not present

## 2015-08-18 ENCOUNTER — Ambulatory Visit (INDEPENDENT_AMBULATORY_CARE_PROVIDER_SITE_OTHER): Payer: Medicare Other | Admitting: Internal Medicine

## 2015-08-18 ENCOUNTER — Telehealth: Payer: Self-pay | Admitting: Pulmonary Disease

## 2015-08-18 ENCOUNTER — Telehealth: Payer: Self-pay | Admitting: *Deleted

## 2015-08-18 ENCOUNTER — Encounter: Payer: Self-pay | Admitting: Internal Medicine

## 2015-08-18 VITALS — BP 103/48 | HR 74 | Temp 98.1°F | Wt 157.9 lb

## 2015-08-18 DIAGNOSIS — Z87891 Personal history of nicotine dependence: Secondary | ICD-10-CM

## 2015-08-18 DIAGNOSIS — I5030 Unspecified diastolic (congestive) heart failure: Secondary | ICD-10-CM

## 2015-08-18 DIAGNOSIS — J449 Chronic obstructive pulmonary disease, unspecified: Secondary | ICD-10-CM | POA: Diagnosis not present

## 2015-08-18 DIAGNOSIS — Z8673 Personal history of transient ischemic attack (TIA), and cerebral infarction without residual deficits: Secondary | ICD-10-CM | POA: Diagnosis not present

## 2015-08-18 DIAGNOSIS — E785 Hyperlipidemia, unspecified: Secondary | ICD-10-CM | POA: Diagnosis not present

## 2015-08-18 DIAGNOSIS — Z7982 Long term (current) use of aspirin: Secondary | ICD-10-CM

## 2015-08-18 DIAGNOSIS — I509 Heart failure, unspecified: Secondary | ICD-10-CM

## 2015-08-18 DIAGNOSIS — R0609 Other forms of dyspnea: Secondary | ICD-10-CM

## 2015-08-18 DIAGNOSIS — I2510 Atherosclerotic heart disease of native coronary artery without angina pectoris: Secondary | ICD-10-CM

## 2015-08-18 DIAGNOSIS — J439 Emphysema, unspecified: Secondary | ICD-10-CM

## 2015-08-18 MED ORDER — ATORVASTATIN CALCIUM 40 MG PO TABS: 40.0000 mg | ORAL_TABLET | Freq: Every day | ORAL | Status: DC

## 2015-08-18 NOTE — Progress Notes (Signed)
Medicine attending: Medical history, presenting problems, physical findings, and medications, reviewed with resident physician Dr Erik Hoffman on the day of the patient visit and I concur with his evaluation and management plan. 

## 2015-08-18 NOTE — Assessment & Plan Note (Addendum)
HPI: She has stopped Brilinta, her dyspnea has improved.  A: DOE improved  P: - Follow up PFT results to determine severity of COPD

## 2015-08-18 NOTE — Telephone Encounter (Signed)
Rec'd call from Wayne Both for [facility] requesting a VO for OT 2 times WK and 1 time a week for 2 WK.  She can be reached @ [phone] if any questions.

## 2015-08-18 NOTE — Telephone Encounter (Signed)
Pt called - no answer; left message. PFT's scheduled scheduled Wed 2/22 @ 1400 PM with arrival time of 1345 PM to register at Admission Office. No caffeine, smoking, inhalers 4 hr prior to test. And to call if as any questions.

## 2015-08-18 NOTE — Patient Instructions (Addendum)
General Instructions:  I want you to have pulmonary function tests completed.  I am increasing you Atorvastatin to 40mg  a day (take 2 pills until you get the new prescription)  Thank you for bringing your medicines today. This helps [place] keep you safe from mistakes.   Progress Toward Treatment Goals:  Treatment Goal 08/09/2013  Blood pressure deteriorated    Self Care Goals & Plans:  Self Care Goal 10/15/2013  Manage my medications take my medicines as prescribed; bring my medications to every visit; refill my medications on time  Monitor my health keep track of my weight  Eat healthy foods eat more vegetables; eat foods that are low in salt  Be physically active -    No flowsheet data found.   Care Management & Community Referrals:  Referral 09/18/2012  Referrals made for care management support none needed

## 2015-08-18 NOTE — Assessment & Plan Note (Signed)
HPI: She has a noted history of COPD without formal spirometry.  PFTs were ordered in September but never completed  A: COPD of undetermined severity  P; I have asked her to complete the PFTs that were ordered by her PCP Dr Randell Patient and to follow up with her in 3 weeks,.

## 2015-08-18 NOTE — Progress Notes (Signed)
[place] [facility] Subjective:   Patient ID: Jane [name] female   DOB: 04-Mar-1929 82 y.o.   MRN: [id number]  HPI: [name] is a 82 y.o. female with a PMH detailed below who presents for HFU She was recently hospitalized for exertional dyspnea, initially treated with IV diuresis but per notes later attributed to [facility] and she was instructed to stop this medication.  Please see problem based charting below for the status of her chronic medical problems.    Past Medical History  Diagnosis Date  . Anemia   . Anxiety   . GERD (gastroesophageal reflux disease) 2009    EGD with benign gastric polyp too  . Hyperlipidemia   . Hypertension   . Vitamin D deficiency   . Colon polyp     2009 colonoscopy, not retrieved for pathology  . Hiatal hernia   . Diverticulosis 2009  . Aortic insufficiency     mild  . Stroke ([place]) 1975  . CKD (chronic kidney disease) stage 3, GFR 30-59 ml/min   . CHF (congestive heart failure) ([place])   . CAD (coronary artery disease)     cath 08/09/2014 95% stenosis in prox to mid RCA s/p DES, 80-90% prox OM2, 50% distal LAD  . COPD (chronic obstructive pulmonary disease) ([place])   . Ectropion of left lower eyelid   . Arthritis     "arms" (08/09/2015)   Current Outpatient Prescriptions  Medication Sig Dispense Refill  . acetaminophen (TYLENOL) 325 MG tablet Take 650 mg by mouth every 6 (six) hours as needed for mild pain.    Marland Kitchen albuterol (PROVENTIL HFA;VENTOLIN HFA) 108 (90 Base) MCG/ACT inhaler Inhale 2 puffs into the lungs every 4 (four) hours as needed for wheezing or shortness of breath. 3 Inhaler 0  . aspirin EC 81 MG tablet Take 81 mg by mouth daily.    Marland Kitchen atorvastatin (LIPITOR) 40 MG tablet Take 1 tablet (40 mg total) by mouth daily at 6 PM. 30 tablet 11  . bisoprolol (ZEBETA) 5 MG tablet Take 0.5 tablets (2.5 mg total) by mouth daily. 90 tablet 1  . cyanocobalamin (,VITAMIN B-12,) 1000 MCG/ML injection Inject 1 mL (1,000 mcg  total) into the muscle every 30 (thirty) days. 1 mL 0  . ferrous sulfate 325 (65 FE) MG tablet Take 325 mg by mouth daily with breakfast.    . furosemide (LASIX) 40 MG tablet Take 1 tablet (40 mg total) by mouth daily. 90 tablet 0  . hydroxypropyl methylcellulose / hypromellose (ISOPTO TEARS / GONIOVISC) 2.5 % ophthalmic solution Place 1 drop into the left eye 4 (four) times daily as needed for dry eyes. 15 mL 2  . isosorbide mononitrate (IMDUR) 30 MG 24 hr tablet Take 1 tablet (30 mg total) by mouth daily. 90 tablet 0  . losartan (COZAAR) 50 MG tablet Take 1 tablet (50 mg total) by mouth daily. 90 tablet 0  . pantoprazole (PROTONIX) 20 MG tablet take 1 tablet by mouth once daily 30 tablet 6  . potassium chloride SA (K-DUR,KLOR-CON) 20 MEQ tablet take 1 tablet by mouth once daily 90 tablet 3   No current facility-administered medications for this visit.   Family History  Problem Relation Age of Onset  . Stroke Mother   . Hypertension Mother   . Stroke Father   . Hypertension Father   . Diabetes Sister    Social History   Social History  . Marital Status: Widowed    Spouse Name: N/A  .  Number of Children: N/A  . Years of Education: N/A   Social History Main Topics  . Smoking status: Former Smoker    Types: Cigarettes    Quit date: 07/02/1979  . Smokeless tobacco: Former Systems developer  . Alcohol Use: No  . Drug Use: No  . Sexual Activity: No   Other Topics Concern  . None   Social History Narrative   Review of Systems: Review of Systems  Constitutional: Negative for fever.  Respiratory: Positive for cough (occ). Negative for sputum production and shortness of breath.   Cardiovascular: Negative for chest pain, orthopnea and leg swelling.  Gastrointestinal: Negative for abdominal pain.  Neurological: Negative for headaches.     Objective:  Physical Exam: Filed Vitals:   08/18/15 1512  BP: 103/48  Pulse: 74  Temp: 98.1 F (36.7 C)  TempSrc: Oral  Weight: 157 lb 14.4 oz  (71.623 kg)  SpO2: 99%   Physical Exam  Constitutional: She is well-developed, well-nourished, and in no distress.  Cardiovascular: Normal rate and regular rhythm.   Pulmonary/Chest: Effort normal and breath sounds normal. She has no wheezes. She has no rales.  Musculoskeletal: She exhibits no edema.  Nursing note and vitals reviewed.    Assessment & Plan:  Case discussed with Dr. Beryle Beams  COPD (chronic obstructive pulmonary disease) HPI: She has a noted history of COPD without formal spirometry.  PFTs were ordered in September but never completed  A: COPD of undetermined severity  P; I have asked her to complete the PFTs that were ordered by her PCP Dr Randell Patient and to follow up with her in 3 weeks,.  Heart failure with preserved ejection fraction (HCC) HPI: Her dyspnea has improved, she is otherwise asymptomatic  A: Chronic HFpEF  P: Stable continue current medications, blood pressure is borderline today but she is asymptomatic so will not adjust.  Hyperlipidemia HPI: She has been taking Atorvastatin 20mg  daily  A: HLD with history of CAD and CVA  P: -Recent hospital admission noted that she has not on a high intensity statin and her LDL was not at goal.  I am not sure of the full benefit of increasing the atorvastatin to 40mg  given her age and medical problems but she reports she does not have any problems with the medication and is willing to try the higher dose.   Dyspnea on exertion HPI: She has stopped Brilinta, her dyspnea has improved.  A: DOE improved  P: - Follow up PFT results to determine severity of COPD    Medications Ordered Meds ordered this encounter  Medications  . atorvastatin (LIPITOR) 40 MG tablet    Sig: Take 1 tablet (40 mg total) by mouth daily at 6 PM.    Dispense:  30 tablet    Refill:  11   Other Orders No orders of the defined types were placed in this encounter.   Follow Up: Return March 9th with Dr Randell Patient.

## 2015-08-18 NOTE — Assessment & Plan Note (Signed)
HPI: She has been taking Atorvastatin 20mg  daily  A: HLD with history of CAD and CVA  P: -Recent hospital admission noted that she has not on a high intensity statin and her LDL was not at goal.  I am not sure of the full benefit of increasing the atorvastatin to 40mg  given her age and medical problems but she reports she does not have any problems with the medication and is willing to try the higher dose.

## 2015-08-18 NOTE — Assessment & Plan Note (Signed)
HPI: Her dyspnea has improved, she is otherwise asymptomatic  A: Chronic HFpEF  P: Stable continue current medications, blood pressure is borderline today but she is asymptomatic so will not adjust.

## 2015-08-21 ENCOUNTER — Other Ambulatory Visit: Payer: Self-pay | Admitting: *Deleted

## 2015-08-21 DIAGNOSIS — K219 Gastro-esophageal reflux disease without esophagitis: Secondary | ICD-10-CM | POA: Diagnosis not present

## 2015-08-21 DIAGNOSIS — J449 Chronic obstructive pulmonary disease, unspecified: Secondary | ICD-10-CM | POA: Diagnosis not present

## 2015-08-21 DIAGNOSIS — I5032 Chronic diastolic (congestive) heart failure: Secondary | ICD-10-CM | POA: Diagnosis not present

## 2015-08-21 DIAGNOSIS — I2510 Atherosclerotic heart disease of native coronary artery without angina pectoris: Secondary | ICD-10-CM | POA: Diagnosis not present

## 2015-08-21 DIAGNOSIS — I129 Hypertensive chronic kidney disease with stage 1 through stage 4 chronic kidney disease, or unspecified chronic kidney disease: Secondary | ICD-10-CM | POA: Diagnosis not present

## 2015-08-21 DIAGNOSIS — N183 Chronic kidney disease, stage 3 (moderate): Secondary | ICD-10-CM | POA: Diagnosis not present

## 2015-08-21 NOTE — Patient Outreach (Signed)
Weekly transition of care call placed to member.  No answer, HIPPA compliant voice message left.  Will continue with weekly calls next week.  Valente David, BSN, [place] Management  [facility] Care Manager [phone]

## 2015-08-21 NOTE — Telephone Encounter (Signed)
2 x weeks for 1 week and 1 x week for 2 weeks, OT given verbal order, do you approve?

## 2015-08-21 NOTE — Telephone Encounter (Signed)
Have rtc, no answer, lm for rtc

## 2015-08-22 DIAGNOSIS — I129 Hypertensive chronic kidney disease with stage 1 through stage 4 chronic kidney disease, or unspecified chronic kidney disease: Secondary | ICD-10-CM | POA: Diagnosis not present

## 2015-08-22 DIAGNOSIS — I5032 Chronic diastolic (congestive) heart failure: Secondary | ICD-10-CM | POA: Diagnosis not present

## 2015-08-22 DIAGNOSIS — J449 Chronic obstructive pulmonary disease, unspecified: Secondary | ICD-10-CM | POA: Diagnosis not present

## 2015-08-22 DIAGNOSIS — N183 Chronic kidney disease, stage 3 (moderate): Secondary | ICD-10-CM | POA: Diagnosis not present

## 2015-08-22 DIAGNOSIS — I2510 Atherosclerotic heart disease of native coronary artery without angina pectoris: Secondary | ICD-10-CM | POA: Diagnosis not present

## 2015-08-22 DIAGNOSIS — K219 Gastro-esophageal reflux disease without esophagitis: Secondary | ICD-10-CM | POA: Diagnosis not present

## 2015-08-23 ENCOUNTER — Inpatient Hospital Stay (HOSPITAL_COMMUNITY): Admission: RE | Admit: 2015-08-23 | Payer: Self-pay | Source: Ambulatory Visit

## 2015-08-24 DIAGNOSIS — K219 Gastro-esophageal reflux disease without esophagitis: Secondary | ICD-10-CM | POA: Diagnosis not present

## 2015-08-24 DIAGNOSIS — J449 Chronic obstructive pulmonary disease, unspecified: Secondary | ICD-10-CM | POA: Diagnosis not present

## 2015-08-24 DIAGNOSIS — I2510 Atherosclerotic heart disease of native coronary artery without angina pectoris: Secondary | ICD-10-CM | POA: Diagnosis not present

## 2015-08-24 DIAGNOSIS — I129 Hypertensive chronic kidney disease with stage 1 through stage 4 chronic kidney disease, or unspecified chronic kidney disease: Secondary | ICD-10-CM | POA: Diagnosis not present

## 2015-08-24 DIAGNOSIS — I5032 Chronic diastolic (congestive) heart failure: Secondary | ICD-10-CM | POA: Diagnosis not present

## 2015-08-24 DIAGNOSIS — N183 Chronic kidney disease, stage 3 (moderate): Secondary | ICD-10-CM | POA: Diagnosis not present

## 2015-08-28 ENCOUNTER — Other Ambulatory Visit: Payer: Self-pay | Admitting: *Deleted

## 2015-08-28 NOTE — Patient Outreach (Signed)
Weekly transition of care call placed to member.  She report she is doing "alright."  She denies any shortness of breath or chest discomfort.  She state that she continue to have home visits from the home health agency, denies any concerns.  She report that she continue to weigh herself daily, stating "I don't know why I have to keep doing this, it's always the same."  Discussed the rationale and importance of daily weights and how it helps with management of heart failure.  She verbalizes understanding.  She denies any concerns at this time.  Initial home visit scheduled for next week.  Valente David, BSN, [place] Management  [facility] Care Manager [phone]

## 2015-08-29 DIAGNOSIS — I129 Hypertensive chronic kidney disease with stage 1 through stage 4 chronic kidney disease, or unspecified chronic kidney disease: Secondary | ICD-10-CM | POA: Diagnosis not present

## 2015-08-29 DIAGNOSIS — J449 Chronic obstructive pulmonary disease, unspecified: Secondary | ICD-10-CM | POA: Diagnosis not present

## 2015-08-29 DIAGNOSIS — N183 Chronic kidney disease, stage 3 (moderate): Secondary | ICD-10-CM | POA: Diagnosis not present

## 2015-08-29 DIAGNOSIS — I2510 Atherosclerotic heart disease of native coronary artery without angina pectoris: Secondary | ICD-10-CM | POA: Diagnosis not present

## 2015-08-29 DIAGNOSIS — I5032 Chronic diastolic (congestive) heart failure: Secondary | ICD-10-CM | POA: Diagnosis not present

## 2015-08-29 DIAGNOSIS — K219 Gastro-esophageal reflux disease without esophagitis: Secondary | ICD-10-CM | POA: Diagnosis not present

## 2015-08-29 NOTE — Telephone Encounter (Signed)
Approve. Thanks.  Jacques Earthly, MD  Internal Medicine Teaching Service PGY-2

## 2015-08-30 DIAGNOSIS — I5032 Chronic diastolic (congestive) heart failure: Secondary | ICD-10-CM | POA: Diagnosis not present

## 2015-08-30 DIAGNOSIS — K219 Gastro-esophageal reflux disease without esophagitis: Secondary | ICD-10-CM | POA: Diagnosis not present

## 2015-08-30 DIAGNOSIS — J449 Chronic obstructive pulmonary disease, unspecified: Secondary | ICD-10-CM | POA: Diagnosis not present

## 2015-08-30 DIAGNOSIS — I2510 Atherosclerotic heart disease of native coronary artery without angina pectoris: Secondary | ICD-10-CM | POA: Diagnosis not present

## 2015-08-30 DIAGNOSIS — N183 Chronic kidney disease, stage 3 (moderate): Secondary | ICD-10-CM | POA: Diagnosis not present

## 2015-08-30 DIAGNOSIS — I129 Hypertensive chronic kidney disease with stage 1 through stage 4 chronic kidney disease, or unspecified chronic kidney disease: Secondary | ICD-10-CM | POA: Diagnosis not present

## 2015-08-31 ENCOUNTER — Ambulatory Visit (INDEPENDENT_AMBULATORY_CARE_PROVIDER_SITE_OTHER): Payer: Medicare Other | Admitting: Pulmonary Disease

## 2015-08-31 ENCOUNTER — Ambulatory Visit (HOSPITAL_COMMUNITY)
Admission: RE | Admit: 2015-08-31 | Discharge: 2015-08-31 | Disposition: A | Discharge status: Home / Self Care | Payer: Medicare Other | Source: Ambulatory Visit | Attending: Oncology | Admitting: Oncology

## 2015-08-31 ENCOUNTER — Encounter: Payer: Self-pay | Admitting: Pulmonary Disease

## 2015-08-31 VITALS — BP 114/45 | HR 71 | Temp 98.1°F | Ht 64.0 in | Wt 159.9 lb

## 2015-08-31 DIAGNOSIS — K219 Gastro-esophageal reflux disease without esophagitis: Secondary | ICD-10-CM

## 2015-08-31 DIAGNOSIS — D649 Anemia, unspecified: Secondary | ICD-10-CM

## 2015-08-31 DIAGNOSIS — R5381 Other malaise: Secondary | ICD-10-CM | POA: Diagnosis not present

## 2015-08-31 DIAGNOSIS — Z79899 Other long term (current) drug therapy: Secondary | ICD-10-CM | POA: Diagnosis not present

## 2015-08-31 DIAGNOSIS — R0609 Other forms of dyspnea: Secondary | ICD-10-CM

## 2015-08-31 DIAGNOSIS — Z7982 Long term (current) use of aspirin: Secondary | ICD-10-CM | POA: Diagnosis not present

## 2015-08-31 DIAGNOSIS — J439 Emphysema, unspecified: Secondary | ICD-10-CM | POA: Diagnosis not present

## 2015-08-31 DIAGNOSIS — I872 Venous insufficiency (chronic) (peripheral): Secondary | ICD-10-CM

## 2015-08-31 DIAGNOSIS — B351 Tinea unguium: Secondary | ICD-10-CM

## 2015-08-31 LAB — PULMONARY FUNCTION TEST
DL/VA % pred: 56 %
DL/VA: 2.29 ml/min/mmHg/L
DLCO UNC % PRED: 28 %
DLCO unc: 4.97 ml/min/mmHg
FEF 25-75 POST: 1.86 L/s
FEF 25-75 PRE: 1.47 L/s
FEF2575-%CHANGE-POST: 26 %
FEF2575-%PRED-PRE: 185 %
FEF2575-%Pred-Post: 234 %
FEV1-%Change-Post: 6 %
FEV1-%PRED-POST: 155 %
FEV1-%Pred-Pre: 145 %
FEV1-Post: 1.54 L
FEV1-Pre: 1.44 L
FEV1FVC-%Change-Post: -1 %
FEV1FVC-%PRED-PRE: 112 %
FEV6-%CHANGE-POST: 8 %
FEV6-%Pred-Post: 153 %
FEV6-%Pred-Pre: 141 %
FEV6-Post: 1.88 L
FEV6-Pre: 1.73 L
FEV6FVC-%PRED-PRE: 106 %
FEV6FVC-%Pred-Post: 106 %
FVC-%Change-Post: 8 %
FVC-%Pred-Post: 144 %
FVC-%Pred-Pre: 132 %
FVC-Post: 1.88 L
FVC-Pre: 1.73 L
POST FEV1/FVC RATIO: 82 %
POST FEV6/FVC RATIO: 100 %
PRE FEV1/FVC RATIO: 83 %
Pre FEV6/FVC Ratio: 100 %
RV % pred: 135 %
RV: 3.04 L
TLC % PRED: 112 %
TLC: 4.84 L

## 2015-08-31 MED ORDER — ALBUTEROL SULFATE (2.5 MG/3ML) 0.083% IN NEBU
2.5000 mg | INHALATION_SOLUTION | Freq: Once | RESPIRATORY_TRACT | Status: AC
  Administered 2015-08-31: 2.5 mg via RESPIRATORY_TRACT

## 2015-08-31 NOTE — Progress Notes (Signed)
Subjective:    Patient ID: Jane [name], female    DOB: 08/09/1928, 82 y.o.   MRN: [id number]  HPI [name] is an 82 year old woman with history of anemia anemia, GERD, hyperlipidemia, stroke, CAD, chronic diastolic CHF, COPD, CKD stage III, hypertension presenting for follow-up of her COPD.  She was last seen in clinic 08/18/2015. Her lipitor was increased from 20mg  daily to 40mg  daily.  She denies chest pain or shortness of breath. She reports dyspnea with walking fast to the bathroom but usually paces herself.  Review of Systems Constitutional: no fevers/chills Ears, nose, mouth, throat, and face: no cough Gastrointestinal: no abdominal pain  Past Medical History  Diagnosis Date  . Anemia   . Anxiety   . GERD (gastroesophageal reflux disease) 2009    EGD with benign gastric polyp too  . Hyperlipidemia   . Hypertension   . Vitamin D deficiency   . Colon polyp     2009 colonoscopy, not retrieved for pathology  . Hiatal hernia   . Diverticulosis 2009  . Aortic insufficiency     mild  . Stroke ([place]) 1975  . CKD (chronic kidney disease) stage 3, GFR 30-59 ml/min   . CHF (congestive heart failure) ([place])   . CAD (coronary artery disease)     cath 08/09/2014 95% stenosis in prox to mid RCA s/p DES, 80-90% prox OM2, 50% distal LAD  . COPD (chronic obstructive pulmonary disease) ([place])   . Ectropion of left lower eyelid   . Arthritis     "arms" (08/09/2015)    Current Outpatient Prescriptions on File Prior to Visit  Medication Sig Dispense Refill  . acetaminophen (TYLENOL) 325 MG tablet Take 650 mg by mouth every 6 (six) hours as needed for mild pain.    Marland Kitchen albuterol (PROVENTIL HFA;VENTOLIN HFA) 108 (90 Base) MCG/ACT inhaler Inhale 2 puffs into the lungs every 4 (four) hours as needed for wheezing or shortness of breath. 3 Inhaler 0  . aspirin EC 81 MG tablet Take 81 mg by mouth daily.    Marland Kitchen atorvastatin (LIPITOR) 40 MG tablet Take 1 tablet (40 mg total) by mouth daily  at 6 PM. 30 tablet 11  . bisoprolol (ZEBETA) 5 MG tablet Take 0.5 tablets (2.5 mg total) by mouth daily. 90 tablet 1  . cyanocobalamin (,VITAMIN B-12,) 1000 MCG/ML injection Inject 1 mL (1,000 mcg total) into the muscle every 30 (thirty) days. 1 mL 0  . ferrous sulfate 325 (65 FE) MG tablet Take 325 mg by mouth daily with breakfast.    . furosemide (LASIX) 40 MG tablet Take 1 tablet (40 mg total) by mouth daily. 90 tablet 0  . hydroxypropyl methylcellulose / hypromellose (ISOPTO TEARS / GONIOVISC) 2.5 % ophthalmic solution Place 1 drop into the left eye 4 (four) times daily as needed for dry eyes. 15 mL 2  . isosorbide mononitrate (IMDUR) 30 MG 24 hr tablet Take 1 tablet (30 mg total) by mouth daily. 90 tablet 0  . losartan (COZAAR) 50 MG tablet Take 1 tablet (50 mg total) by mouth daily. 90 tablet 0  . pantoprazole (PROTONIX) 20 MG tablet take 1 tablet by mouth once daily 30 tablet 6  . potassium chloride SA (K-DUR,KLOR-CON) 20 MEQ tablet take 1 tablet by mouth once daily 90 tablet 3   No current facility-administered medications on file prior to visit.   Objective:  Physical Exam Blood pressure 114/45, pulse 71, temperature 98.1 F (36.7 C), temperature source Oral, height  5\' 4"  (1.626 m), weight 159 lb 14.4 oz (72.53 kg), SpO2 99 %. General Apperance: NAD HEENT: Normocephalic, atraumatic, anicteric sclera Neck: Supple, trachea midline Lungs: Clear to auscultation bilaterally. No wheezes, rhonchi or rales. Breathing comfortably on room air Heart: Regular rate and rhythm, no murmur/rub/gallop Abdomen: Soft, nontender, nondistended, no rebound/guarding Extremities: Warm and well perfused, no edema. She has areas of hyperpigmented skin on bilateral lower extremities. Some tenderness to palpation on dark area of skin. DP/PT unable to be palpated. Skin: No rashes or lesions Neurologic: Alert and interactive. No gross deficits.  Assessment & Plan:  Physical deconditioning Assessment: Dyspnea  on exertion improved. PFTs with no obstruction or restriction. No significant response post bronchodilator. Suspect reduced DLCO due to her chronic anemia. Probable dyspnea due to Brilinta. Also has deconditioning at baseline.  Plan: -Continue to monitor  Chronic venous insufficiency b/l lower extremities Assessment: Darkened areas of skin just proximal to bilateral ankles. Claims she rubbed bengay on her lower extremities some time ago that burnt her skin. Diminished b/l distal pulses. Denies claudication but does not ambulate very much.  Plan: -Obtain ABI  Onychomycosis of toenail Assessment: She has not been able to see podiatry. She is still interested in seeing podiatry.  Plan: Podiatry referral placed.  GERD Assessment: Denies chest pain, heartburn, acid reflux.  Plan: -Trial off of PPI. -If she has recurrence of her GERD, would try H2 blocker  Normocytic anemia Assessment: She was still taking ferrous sulfate  Plan: Discontinue ferrous sulfate.

## 2015-08-31 NOTE — Patient Instructions (Signed)
We will make an appointment for you to see a foot doctor We will make an appointment to check your leg arteries Please stop taking ferrous sulfate Please stop taking pantoprazole

## 2015-09-01 ENCOUNTER — Encounter: Payer: Self-pay | Admitting: Pulmonary Disease

## 2015-09-01 NOTE — Assessment & Plan Note (Signed)
Assessment: Denies chest pain, heartburn, acid reflux.  Plan: -Trial off of PPI. -If she has recurrence of her GERD, would try H2 blocker

## 2015-09-01 NOTE — Assessment & Plan Note (Signed)
Assessment: She has not been able to see podiatry. She is still interested in seeing podiatry.  Plan: Podiatry referral placed.

## 2015-09-01 NOTE — Assessment & Plan Note (Signed)
Assessment: Dyspnea on exertion improved. PFTs with no obstruction or restriction. No significant response post bronchodilator. Suspect reduced DLCO due to her chronic anemia. Probable dyspnea due to Brilinta. Also has deconditioning at baseline.  Plan: -Continue to monitor

## 2015-09-01 NOTE — Assessment & Plan Note (Signed)
Assessment: Darkened areas of skin just proximal to bilateral ankles. Claims she rubbed bengay on her lower extremities some time ago that burnt her skin. Diminished b/l distal pulses. Denies claudication but does not ambulate very much.  Plan: -Obtain ABI

## 2015-09-01 NOTE — Assessment & Plan Note (Signed)
Assessment: She was still taking ferrous sulfate  Plan: Discontinue ferrous sulfate.

## 2015-09-04 NOTE — Progress Notes (Signed)
Internal Medicine Clinic Attending  Case discussed with Dr. Krall at the time of the visit.  We reviewed the resident's history and exam and pertinent patient test results.  I agree with the assessment, diagnosis, and plan of care documented in the resident's note.  

## 2015-09-06 ENCOUNTER — Encounter: Payer: Self-pay | Admitting: *Deleted

## 2015-09-06 ENCOUNTER — Ambulatory Visit: Payer: Self-pay | Admitting: *Deleted

## 2015-09-06 ENCOUNTER — Other Ambulatory Visit: Payer: Self-pay | Admitting: *Deleted

## 2015-09-06 DIAGNOSIS — I5041 Acute combined systolic (congestive) and diastolic (congestive) heart failure: Secondary | ICD-10-CM

## 2015-09-06 DIAGNOSIS — I639 Cerebral infarction, unspecified: Secondary | ICD-10-CM

## 2015-09-06 DIAGNOSIS — J441 Chronic obstructive pulmonary disease with (acute) exacerbation: Secondary | ICD-10-CM

## 2015-09-06 NOTE — Addendum Note (Signed)
Addended byValente David on: 09/06/2015 05:06 PM   Modules accepted: Orders

## 2015-09-06 NOTE — Patient Outreach (Signed)
Call placed to member to confirm home visit scheduled for this afternoon.  She state that she thought had to go to the clinic, but was going to call to cancel her appointment.  She state that her apartment building has had a problem with bedbugs and that her apartment is being evaluated tomorrow to determine if she has them and plan possible treatment.  Member notified that this care manager will reschedule home visit after evaluation is complete.    She report that she has been doing "fine."  She said that her weight has maintained between 155-160 since discharge, today 155.  She denies any swelling or shortness of breath.  She does admit that she has not been able to take a shower since discharge, but has been able to stand at the sink and wash up.  She state that she need help getting in and out of the shower.  She does not currently have personal care services, but this care manager will place a consult to social worker for evaluation.  She state that she is able to do most of her ADLs and does not need someone everyday.  Encouraged to allow assessment to determine qualifications.  She agrees.  Member denies any further concerns at this time.  Encouraged to contact with questions.  Will continue with weekly transition of care calls next week.  Valente David, BSN, [place] Management  [facility] Care Manager [phone]

## 2015-09-07 ENCOUNTER — Encounter: Payer: Medicare Other | Admitting: Pulmonary Disease

## 2015-09-12 ENCOUNTER — Other Ambulatory Visit: Payer: Self-pay | Admitting: *Deleted

## 2015-09-12 DIAGNOSIS — I2510 Atherosclerotic heart disease of native coronary artery without angina pectoris: Secondary | ICD-10-CM | POA: Diagnosis not present

## 2015-09-12 DIAGNOSIS — I5032 Chronic diastolic (congestive) heart failure: Secondary | ICD-10-CM | POA: Diagnosis not present

## 2015-09-12 DIAGNOSIS — J449 Chronic obstructive pulmonary disease, unspecified: Secondary | ICD-10-CM | POA: Diagnosis not present

## 2015-09-12 DIAGNOSIS — I129 Hypertensive chronic kidney disease with stage 1 through stage 4 chronic kidney disease, or unspecified chronic kidney disease: Secondary | ICD-10-CM | POA: Diagnosis not present

## 2015-09-12 DIAGNOSIS — N183 Chronic kidney disease, stage 3 (moderate): Secondary | ICD-10-CM | POA: Diagnosis not present

## 2015-09-12 DIAGNOSIS — I5041 Acute combined systolic (congestive) and diastolic (congestive) heart failure: Secondary | ICD-10-CM

## 2015-09-12 DIAGNOSIS — K219 Gastro-esophageal reflux disease without esophagitis: Secondary | ICD-10-CM | POA: Diagnosis not present

## 2015-09-12 NOTE — Patient Outreach (Signed)
Weekly transition of care call placed to member.  She state that she is doing "alright."  She state that she did have her apartment inspected for bed bugs, and that she "think" they only found one.  She reports that she need to get rid of her couch because it is old and she is unsure if there are any bugs in there.  She denies seeing any.  Member reports that she continues to weigh herself daily, stating "it's always about the same, between 155-160 pounds."  She denies excessive swelling, and only report shortness of breath when she is trying get to the bathroom in a rush.  She state that she continues to have physical therapy come to work with her for strengthening.    She state that she has an appointment this Friday at the outpatient clinic, denies needing transportation.  She state that she will contact her son to see if he will be available, if not, she state she will reschedule appointment.  Discussed the possibility of using [facility], she refuses, stating that she does not like to use that service.    Member made aware that she has completed her transition of care program, discussed the possibility to have ongoing services with either this care manager or a telephonic health coach.  She is able to verbalize signs/symptoms of heart failure and when to notify the physician, but is forgetful and need frequent re-education.  She denies needing this care manager to continue and denies the need for home visits.  Offered again telephonic services, she is reluctant, but agrees.  Will refer to telephonic health coach for continued education.   Valente David, BSN, [place] Management  [facility] Care Manager [phone]

## 2015-09-14 ENCOUNTER — Encounter: Payer: Self-pay | Admitting: *Deleted

## 2015-09-14 ENCOUNTER — Other Ambulatory Visit: Payer: Self-pay | Admitting: *Deleted

## 2015-09-14 NOTE — Patient Outreach (Signed)
Jane [name]  09/14/2015  Jane [name] 06/05/1929 [id number]   CSW received a new referral on patient from Valente David, [facility] with [place] [name], reporting that patient would benefit from social work services and resources to assist with obtaining home care services and assistance.  More specifically, [name] stated that patient is an 82 year old lady, currently living alone, dependent upon a walker for ambulation.  Patient admits to [name] that she is in need of assistance with activities of daily living.  [name] would also like for CSW to assess patient for available transportation resources, as patient is reliant upon her son and daughter-in-law, but that they are not always available.   CSW made an initial attempt to try and contact patient today to perform phone assessment, as well as assess and assist with social needs and services, without success.  A HIPAA complaint message was left for patient on voicemail.  CSW is currently awaiting a return call. [name], BSW, MSW, LCSW  Licensed Education officer, environmental Health System  Mailing [place] [address] [place], [place] [address] Physical Address-[address], [place], [place] [address] Toll Free Main # [phone] Fax # [phone] Cell # [phone]  Fax # [phone]  [email or web address]  Patient's preferred language:  [place]   English  ATTENTION:  If you speak [place], language assistance services, free of charge, are available to you.    Nondiscrimination and Accessibility Statement: Discrimination is Against the [employer], a subsidiary of [employer], complies with [employer] civil rights laws and does not discriminate on the basis of race, color, national origin, age, disability, or sex.  [place] does not exclude people or treat them differently  because of race, color, national origin, age, disability, or sex.  [place] Providers will:  . Provide free aids and services to people with disabilities to communicate effectively with [place], such as:     ? Qualified sign language interpreters  ? Written information in other formats (large print, audio, accessible electronic formats, other formats)   . Provide free language services to people whose primary language is not [place], such as:    ? Qualified interpreters    ? Information written in other languages   If you need these services, contact your [facility] Fore[facility] psychologist.  If you believe that a [facility] [employer] has failed to provide these services or discriminated in another way on the basis of race, color, national origin, age, disability, or sex, you can file a Tourist information centre manager with: [place], [phone] or [email or web address]  You can file a grievance in person or by mail, fax, or email. If you need help filing a grievance, you may contact Valrie Hart, Interim Compliance Officer, [facility] Department of Compliance and Integrity, [address], [place], [place]. [place], [phone], [email or web address].    You can also file a civil rights complaint with the U.S. Department of Health and Financial controller, Office for [employer], electronically through the Office for Civil Rights Complaint Portal, available at OnSiteLending.nl.jsf, or by mail or phone at:  [place]. Department of Health and Human Services [address], [place] Room [phone], [facility] Building [place], [place]  [phone], [phone] (TDD) Complaint forms are available at [email or web address].

## 2015-09-15 ENCOUNTER — Encounter (HOSPITAL_COMMUNITY): Payer: Self-pay

## 2015-09-18 ENCOUNTER — Encounter (HOSPITAL_COMMUNITY): Payer: Self-pay

## 2015-09-19 ENCOUNTER — Other Ambulatory Visit: Payer: Self-pay | Admitting: *Deleted

## 2015-09-19 ENCOUNTER — Ambulatory Visit (HOSPITAL_COMMUNITY)
Admission: RE | Admit: 2015-09-19 | Discharge: 2015-09-19 | Disposition: A | Discharge status: Home / Self Care | Payer: Medicare Other | Source: Ambulatory Visit | Attending: Vascular Surgery | Admitting: Vascular Surgery

## 2015-09-19 ENCOUNTER — Encounter: Payer: Self-pay | Admitting: *Deleted

## 2015-09-19 ENCOUNTER — Other Ambulatory Visit: Payer: Self-pay | Admitting: Pulmonary Disease

## 2015-09-19 DIAGNOSIS — R938 Abnormal findings on diagnostic imaging of other specified body structures: Secondary | ICD-10-CM | POA: Diagnosis not present

## 2015-09-19 DIAGNOSIS — N183 Chronic kidney disease, stage 3 (moderate): Secondary | ICD-10-CM | POA: Diagnosis not present

## 2015-09-19 DIAGNOSIS — I509 Heart failure, unspecified: Secondary | ICD-10-CM | POA: Insufficient documentation

## 2015-09-19 DIAGNOSIS — E785 Hyperlipidemia, unspecified: Secondary | ICD-10-CM | POA: Diagnosis not present

## 2015-09-19 DIAGNOSIS — I2510 Atherosclerotic heart disease of native coronary artery without angina pectoris: Secondary | ICD-10-CM | POA: Insufficient documentation

## 2015-09-19 DIAGNOSIS — R0989 Other specified symptoms and signs involving the circulatory and respiratory systems: Secondary | ICD-10-CM | POA: Diagnosis not present

## 2015-09-19 DIAGNOSIS — K219 Gastro-esophageal reflux disease without esophagitis: Secondary | ICD-10-CM | POA: Insufficient documentation

## 2015-09-19 DIAGNOSIS — I130 Hypertensive heart and chronic kidney disease with heart failure and stage 1 through stage 4 chronic kidney disease, or unspecified chronic kidney disease: Secondary | ICD-10-CM | POA: Insufficient documentation

## 2015-09-19 NOTE — Patient Outreach (Signed)
[facility]) Care Management  09/19/2015  Jane [name] 01-17-1929 [id number]   CSW was able to make initial contact with Jane [name] today to perform phone assessment, as well as assess and assist with social work needs and services.  CSW introduced self, explained role and types of services provided through [place] Management ([place] [facility]).  CSW further explained to Jane [name] that CSW works with Jane [name]'s RNCM, also with [place] Management, Valente David. CSW then explained the reason for the call, indicating that Jane [name] thought that Jane [name] would benefit from social work services and resources to assist with arranging personal care services for Jane [name] in the home.  CSW obtained two HIPAA compliant identifiers from Jane [name], which included Jane [name]'s name and date of birth. Jane [name] admits that she would benefit from "additional help around the house", as her son and daughter-in-law are not always available.  Jane [name] reported that she is able to perform all activities of daily living independently, she would just need assistance with light housekeeping duties, grocery shopping, meal preparation, transportation to and from physician appointments, etc.  CSW spoke with Jane [name] at length about referring Jane [name] to [place] Forensic scientist) through the [place], as well as [employer] (Publishing rights manager) through [facility].  Jane [name] expressed an interested in both types of home care agencies and the services provided with each, agreeing to allow CSW to make the appropriate referrals.  Applications have been submitted for both and Jane [name] is aware of the approval/denial process moving forward.  No additional social work needs identified at this time.  Jane [name] provided contact information for CSW. CSW will perform a case closure on Jane [name], as all goals of treatment have been met from social work  standpoint and no additional social work needs have been identified at this time. CSW will notify Jane [name]'s RNCM with [place] Management, Valente David of CSW's plans to close Jane [name]'s case. CSW will fax a correspondence letter to Jane [name]'s Primary Care Physician, [name] to ensure that Dr. Randell Jane [name] is aware of CSW's case closure plans.   CSW will submit a case closure request to Lurline Del, Care Management Assistant with [place] Management, in the form of an [employer].  CSW will ensure that Jane [name] is aware of [name], RNCM with [place] Management, continued involvement with Jane [name]'s care.  [name], BSW, MSW, LCSW  Licensed Education officer, environmental Health System  Mailing [place] [address], [place], [place] [address] Physical Address-[address], [place], [place] [address] Toll Free Main # [phone] Fax # [phone] Cell # [phone]  Fax # [phone]  [name]  Jane [name]'s preferred language:  [place]   English  ATTENTION:  If you speak [place], language assistance services, free of charge, are available to you.    Nondiscrimination and Accessibility Statement: Discrimination is Against the [employer], a subsidiary of [employer], complies with [employer] civil rights laws and does not discriminate on the basis of race, color, national origin, age, disability, or sex.  [place] does not exclude people or treat them differently because of race, color, national origin, age, disability, or sex.  [place] Providers will:  . Provide free aids and services to people with disabilities to communicate effectively with [place], such as:     ? Qualified sign language interpreters  ? Written  information in other formats (large print, audio, accessible electronic  formats, other formats)   . Provide free language services to people whose primary language is not [place], such as:    ? Qualified interpreters    ? Information written in other languages   If you need these services, contact your [facility] Fore[facility] psychologist.  If you believe that a [facility] [employer] has failed to provide these services or discriminated in another way on the basis of race, color, national origin, age, disability, or sex, you can file a Tourist information centre manager with: [place], [phone] or [email or web address]  You can file a grievance in person or by mail, fax, or email. If you need help filing a grievance, you may contact Valrie Hart, Interim Compliance Officer, [facility] Department of Compliance and Integrity, [place], [place], [place]. [place], [phone], [email or web address].    You can also file a civil rights complaint with the U.S. Department of Health and Financial controller, Office for [employer], electronically through the Office for Civil Rights Complaint Portal, available at OnSiteLending.nl.jsf, or by mail or phone at:  [place]. Department of Health and Human Services [address] [place] Room [phone], [facility] Building [place], [place]  [phone], [phone] (TDD) Complaint forms are available at CutFunds.[email or web address].

## 2015-09-20 DIAGNOSIS — K219 Gastro-esophageal reflux disease without esophagitis: Secondary | ICD-10-CM | POA: Diagnosis not present

## 2015-09-20 DIAGNOSIS — I129 Hypertensive chronic kidney disease with stage 1 through stage 4 chronic kidney disease, or unspecified chronic kidney disease: Secondary | ICD-10-CM | POA: Diagnosis not present

## 2015-09-20 DIAGNOSIS — I2510 Atherosclerotic heart disease of native coronary artery without angina pectoris: Secondary | ICD-10-CM | POA: Diagnosis not present

## 2015-09-20 DIAGNOSIS — J449 Chronic obstructive pulmonary disease, unspecified: Secondary | ICD-10-CM | POA: Diagnosis not present

## 2015-09-20 DIAGNOSIS — N183 Chronic kidney disease, stage 3 (moderate): Secondary | ICD-10-CM | POA: Diagnosis not present

## 2015-09-20 DIAGNOSIS — I5032 Chronic diastolic (congestive) heart failure: Secondary | ICD-10-CM | POA: Diagnosis not present

## 2015-09-27 ENCOUNTER — Ambulatory Visit: Payer: Medicare Other | Admitting: Podiatry

## 2015-10-08 IMAGING — CR DG CHEST 2V
2 series · 2 of 2 positions shown · non-contrast
Comparison: February 06, 2013.

CLINICAL DATA: Shortness of breath.

EXAM:
CHEST  2 VIEW

[w chest lat]
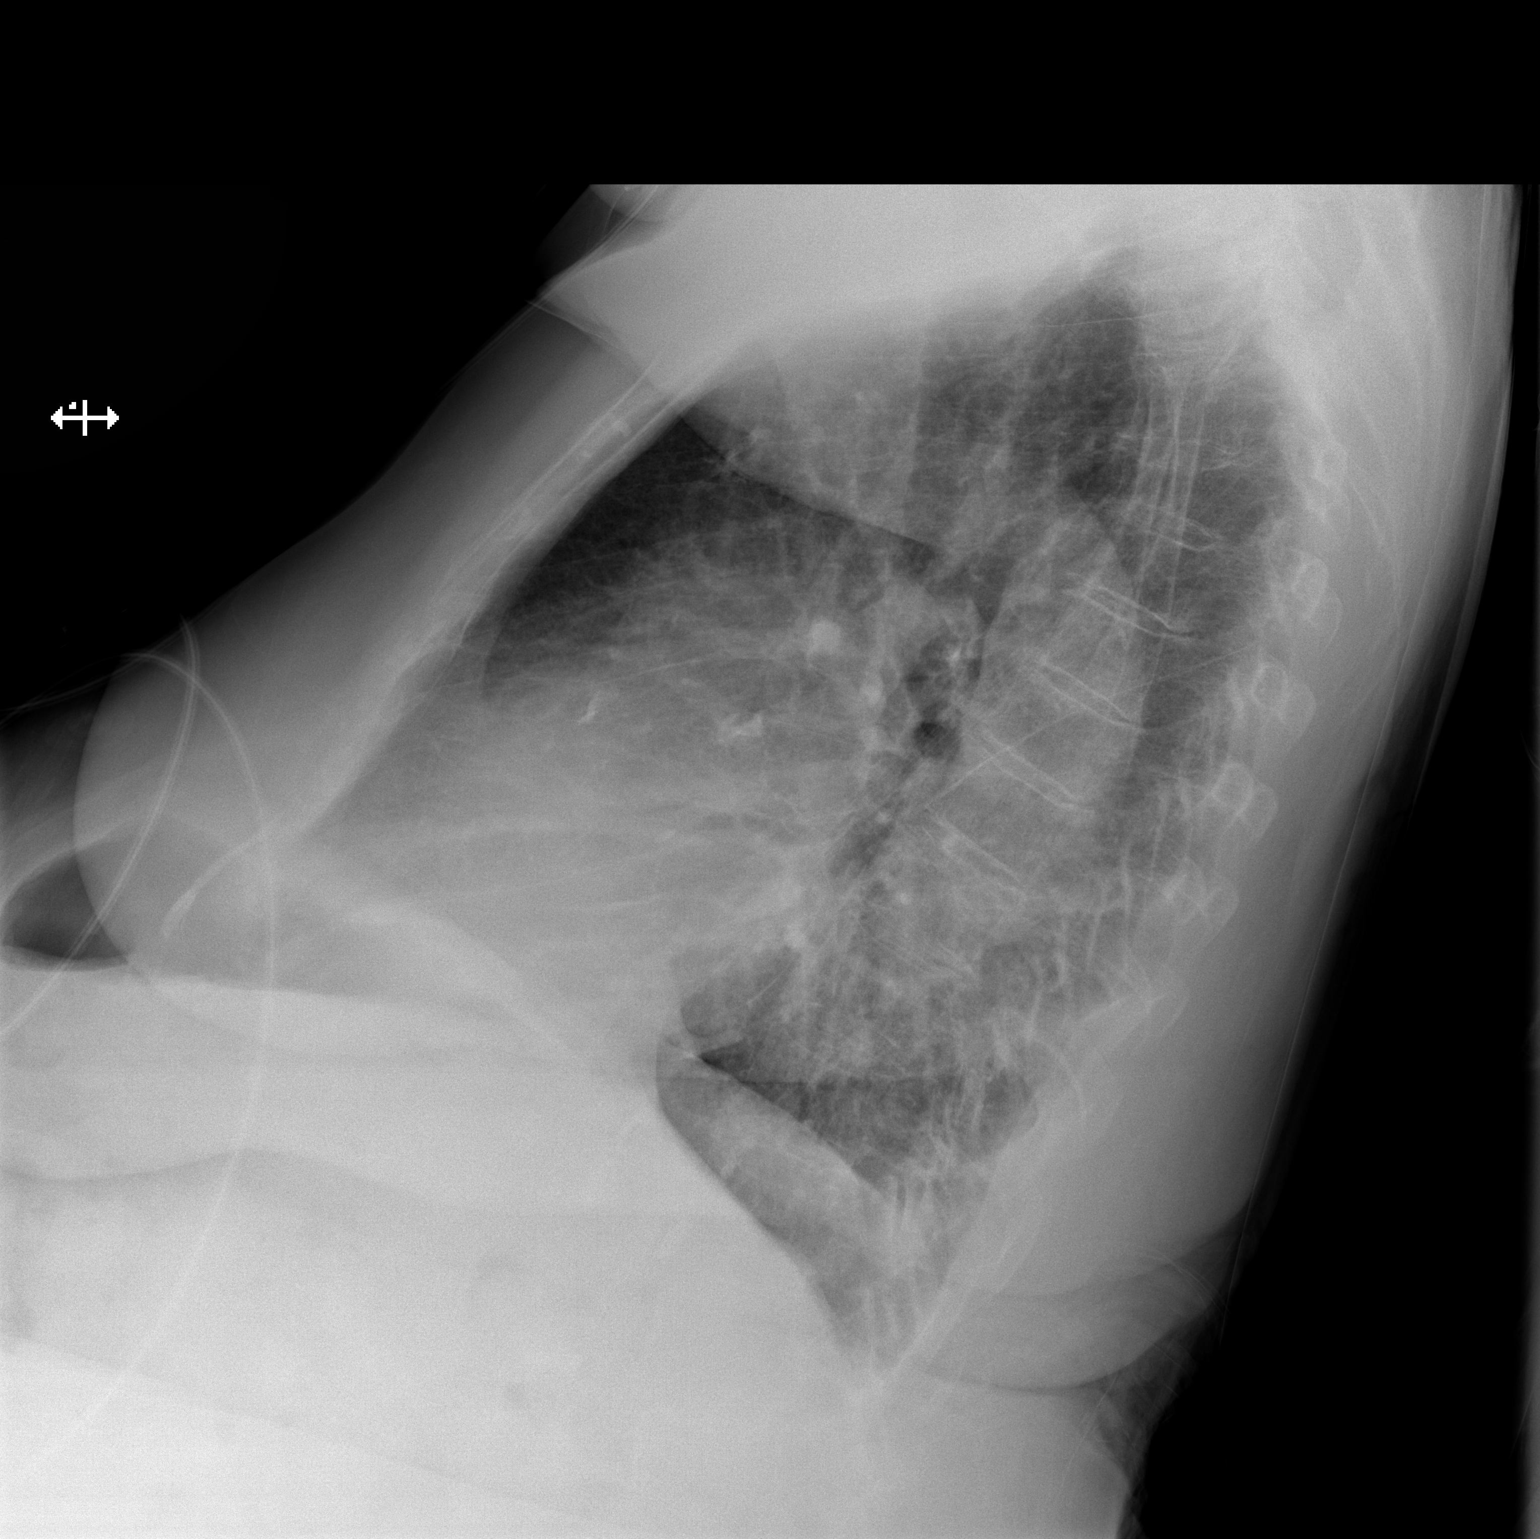

[x chest ap]
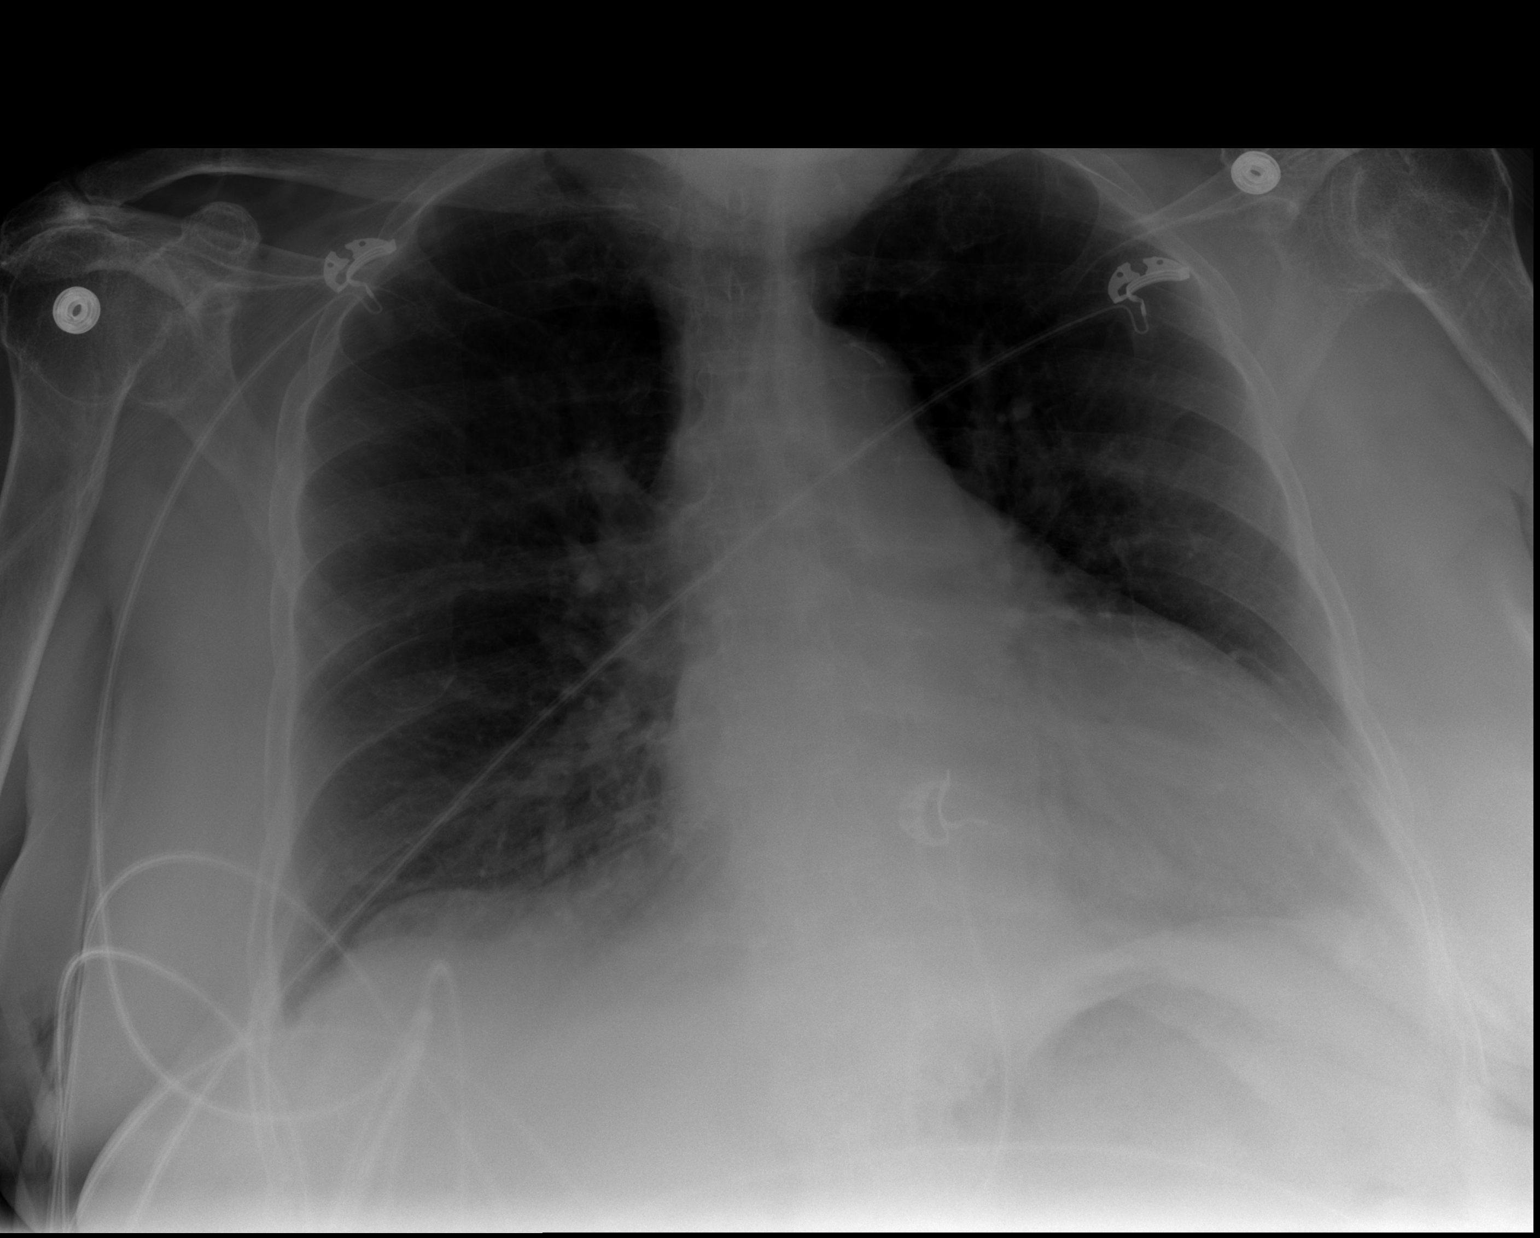

[2 of 2 positions shown; findings below may reference images not displayed]

FINDINGS: Stable cardiomediastinal silhouette. No pleural effusion or
pneumothorax is noted. No acute pulmonary disease is noted. Stable
mild central pulmonary vascular congestion. Narrowing of the right
acromial humeral space is noted suggesting rotator cuff injury.
IMPRESSION: Stable mild central pulmonary vascular congestion. No other
significant abnormality seen.

## 2015-10-10 ENCOUNTER — Ambulatory Visit: Payer: Self-pay | Admitting: *Deleted

## 2015-10-13 ENCOUNTER — Ambulatory Visit: Payer: Self-pay | Admitting: *Deleted

## 2015-10-23 ENCOUNTER — Other Ambulatory Visit: Payer: Self-pay | Admitting: *Deleted

## 2015-10-23 ENCOUNTER — Encounter: Payer: Self-pay | Admitting: *Deleted

## 2015-10-23 ENCOUNTER — Ambulatory Visit: Payer: Self-pay | Admitting: *Deleted

## 2015-10-23 NOTE — Patient Outreach (Addendum)
[facility]) Care Management  [place]  10/23/2015   [name] [birth year] [id number]  Subjective: RN Health Coach telephone call to patient.  Hipaa compliance verified. Per patient she always weighed between 155- 166 pounds. RN Health Coach explained that it is important that she writes it down. Per patient she writes it on the wall calendar but she is not taking it at the same time each. When asked what zone she is in . Per patient that good one. Patient had to be reminded the good zone is the green zone. Patient does try to cook low sodium food. She still use some can foods and pour the juice off. Patient uses frozen foods mostly. Patient states she is able to cook for self. She uses a walker but when she is home she stated he apartment is so large that she can't get into the kitchen with the walker. The patient stated she was depending on her son and daughter in law but her nephew takes her to the Dr and waits on her and checks on her. Patient does not actively exercise. She stated they do have an exercise program down stairs but she hasn't checked on it yet. Patient stated she does have some bladder incontinence due to the lasix and wears depends. Patient stated she is not having any pain today sometimes she has back pain.  Patient does have a little memory impairment and needs information to be reiterated. Patient agreed to follow up outreach calls.   Objective:   Encounter Medications:  Outpatient Encounter Prescriptions as of 10/23/2015  Medication Sig Note  . acetaminophen (TYLENOL) 325 MG tablet Take 650 mg by mouth every 6 (six) hours as needed for mild pain.   Marland Kitchen albuterol (PROVENTIL HFA;VENTOLIN HFA) 108 (90 Base) MCG/ACT inhaler Inhale 2 puffs into the lungs every 4 (four) hours as needed for wheezing or shortness of breath.   Marland Kitchen aspirin EC 81 MG tablet Take 81 mg by mouth daily. 01/01/2013: .....  . atorvastatin (LIPITOR) 40 MG tablet Take 1 tablet (40 mg  total) by mouth daily at 6 PM.   . bisoprolol (ZEBETA) 5 MG tablet Take 0.5 tablets (2.5 mg total) by mouth daily.   . cyanocobalamin (,VITAMIN B-12,) 1000 MCG/ML injection Inject 1 mL (1,000 mcg total) into the muscle every 30 (thirty) days.   . furosemide (LASIX) 40 MG tablet Take 1 tablet (40 mg total) by mouth daily.   . hydroxypropyl methylcellulose / hypromellose (ISOPTO TEARS / GONIOVISC) 2.5 % ophthalmic solution Place 1 drop into the left eye 4 (four) times daily as needed for dry eyes. 10/16/2014: .  . isosorbide mononitrate (IMDUR) 30 MG 24 hr tablet Take 1 tablet (30 mg total) by mouth daily.   Marland Kitchen losartan (COZAAR) 50 MG tablet Take 1 tablet (50 mg total) by mouth daily.   . potassium chloride SA (K-DUR,KLOR-CON) 20 MEQ tablet take 1 tablet by mouth once daily    No facility-administered encounter medications on file as of 10/23/2015.    Functional Status:  In your present state of health, do you have any difficulty performing the following activities: 10/23/2015 09/19/2015  Hearing? N Y  Vision? N N  Difficulty concentrating or making decisions? Tempie Donning  Walking or climbing stairs? Y Y  Dressing or bathing? Y Y  Doing errands, shopping? Tempie Donning  Preparing Food and eating ? N N  Using the Toilet? N N  In the past six months, have you accidently leaked  urine? Y Y  Do you have problems with loss of bowel control? N N  Managing your Medications? N N  Managing your Finances? N N  Housekeeping or managing your Housekeeping? Tempie Donning    Fall/Depression Screening: PHQ 2/9 Scores 10/23/2015 09/19/2015 09/06/2015 08/31/2015 08/18/2015 03/30/2015 02/08/2015  PHQ - 2 Score 0 0 0 0 0 0 0   THN CM Care Plan Problem One        Most Recent Value   Care Plan Problem One  Knowledge Deficit in Self Management of Congestive Heart Failure   Role Documenting the Problem One  [place] for Problem One  Active   THN Long Term Goal (31-90 days)  Patient will not have any admissions within the next 90  days   THN Long Term Goal Start Date  10/23/15   Interventions for Problem One [place] reminded patient to keep appointments with primary care physician and cardiologist. [place] reminded patient to take medications nas per physician order, RN will follow up with patient within a month.   THN CM Short Term Goal #1 (0-30 days)  Patient will report weighing and recording daily within the next 30 days   THN CM Short Term Goal #1 Start Date  10/23/15   Interventions for Short Term Goal #1  RN explained the importance of weighing each morning and documenting in calendar book so she can show the Dr. Shelly Bombard will send patient EMMI information on Keeping  track of your weight each day. Understanding water pills and thirs.   THN CM Short Term Goal #2 (0-30 days)  Patient will be able to verbalize how to read the sodium content on labels within the next 30 days   THN CM Short Term Goal #2 Start Date  10/23/15   Interventions for Short Term Goal #2  RN will send patient a chart showing how to read labels. RN will send patient educational information on low sodium diet. RN will sned educational information on dash diet. RN will follow up with discussion and teach back    THN CM Short Term Goal #3 (0-30 days)  Patient will be able to verbalize the zones within the next 30 days   THN CM Short Term Goal #3 Start Date  10/23/15   Interventions for Short Tern Goal #3  RN discussed what the Zones are. Patient has the magnet on the refrigerator. She can't remember the colors so describes the green as good zone. RN will reiterate each follow up out reach call what the colors mean.     Assessment:  Patient will benefit from Health Coach telephonic outreach for education and support for Congestive Heart Failure self management.  Plan: RN will send a chart to teach patient how to read labels and what they mean RN will send educational material on low sodium diet and dash diet  RN will send  initial barriers letter, assessment ad care plan to primary care physician RN will follow up outreach within 30 days for discussion and teach back.  [place] Care Management [phone]

## 2015-10-24 ENCOUNTER — Encounter: Payer: Self-pay | Admitting: Gastroenterology

## 2015-10-25 ENCOUNTER — Other Ambulatory Visit: Payer: Self-pay | Admitting: Internal Medicine

## 2015-11-07 NOTE — Addendum Note (Signed)
Addended by: Hulan Fray on: 11/07/2015 08:37 AM   Modules accepted: Orders

## 2015-11-20 ENCOUNTER — Other Ambulatory Visit: Payer: Self-pay | Admitting: *Deleted

## 2015-11-20 ENCOUNTER — Encounter: Payer: Self-pay | Admitting: *Deleted

## 2015-11-20 NOTE — Patient Outreach (Signed)
[place] [facility]) Care Management  [place]  11/20/2015   Jane [name] [birth year] [id number]  Subjective:  RN Health Coach telephone call to patient.  Hipaa compliance verified.Per patient stated she is still weighing between 155-160. RN explained to patient that she needs to weigh each morning at the same time. RN explained that even though it may be in that range it does make a difference if she has gained 2,3,4 ,5 pounds in one day or week. Patient stated she is in the good zone. She does not have any swelling in her legs or ankles, No coughing. Patient sounded a little short or breath when answering the phone. . Per patient she only has one telephone and she had to go to the other room to answer it. Patient states she is taking her medications as per order. Patient stated that she did not go to the podiatrist because she didn't have a way and she didn't remember the Dr name to make another appointment. [place]  Objective:   Encounter Medications:  Outpatient Encounter Prescriptions as of 11/20/2015  Medication Sig Note  . acetaminophen (TYLENOL) 325 MG tablet Take 650 mg by mouth every 6 (six) hours as needed for mild pain.   Marland Kitchen albuterol (PROVENTIL HFA;VENTOLIN HFA) 108 (90 Base) MCG/ACT inhaler Inhale 2 puffs into the lungs every 4 (four) hours as needed for wheezing or shortness of breath.   Marland Kitchen aspirin EC 81 MG tablet Take 81 mg by mouth daily. 01/01/2013: .....  . atorvastatin (LIPITOR) 40 MG tablet Take 1 tablet (40 mg total) by mouth daily at 6 PM.   . bisoprolol (ZEBETA) 5 MG tablet take 1/2 tablet by mouth once daily   . cyanocobalamin (,VITAMIN B-12,) 1000 MCG/ML injection Inject 1 mL (1,000 mcg total) into the muscle every 30 (thirty) days.   . furosemide (LASIX) 40 MG tablet Take 1 tablet (40 mg total) by mouth daily.   . hydroxypropyl methylcellulose / hypromellose (ISOPTO TEARS / GONIOVISC) 2.5 % ophthalmic solution Place 1 drop into the left eye 4 (four)  times daily as needed for dry eyes. 10/16/2014: .  . isosorbide mononitrate (IMDUR) 30 MG 24 hr tablet Take 1 tablet (30 mg total) by mouth daily.   Marland Kitchen losartan (COZAAR) 50 MG tablet Take 1 tablet (50 mg total) by mouth daily.   . potassium chloride SA (K-DUR,KLOR-CON) 20 MEQ tablet take 1 tablet by mouth once daily    No facility-administered encounter medications on file as of 11/20/2015.    Functional Status:  In your present state of health, do you have any difficulty performing the following activities: 11/20/2015 10/23/2015  Hearing? N N  Vision? N N  Difficulty concentrating or making decisions? Tempie Donning  Walking or climbing stairs? Y Y  Dressing or bathing? Y Y  Doing errands, shopping? Tempie Donning  Preparing Food and eating ? N N  Using the Toilet? N N  In the past six months, have you accidently leaked urine? Y Y  Do you have problems with loss of bowel control? N N  Managing your Medications? N N  Managing your Finances? N N  Housekeeping or managing your Housekeeping? Tempie Donning    Fall/Depression Screening: PHQ 2/9 Scores 11/20/2015 10/23/2015 09/19/2015 09/06/2015 08/31/2015 08/18/2015 03/30/2015  PHQ - 2 Score 0 0 0 0 0 0 0    THN CM Care Plan Problem One        Most Recent Value   Care Plan Problem One  Knowledge Deficit in Self Management of Congestive Heart Failure   Role Documenting the Problem One  [place] for Problem One  Active   THN Long Term Goal (31-90 days)  Patient will not have any admissions within the next 90 days   THN Long Term Goal Start Date  11/20/15   Interventions for Problem One [place] reminded patient to keep appointments with primary care physician and cardiologist. [place] reminded patient to take medications nas per physician order, RN will follow up with patient within a month.   THN CM Short Term Goal #1 (0-30 days)  Patient will report weighing and recording daily within the next 30 days   THN CM Short Term Goal #1  Start Date  11/20/15   Interventions for Short Term Goal #1  RN Health Coach reiteraterated the importance of weighing each morning at the same time and documenting in calendar book . RN sent patient EMMI information on Keeping  track of your weight each day. Understanding water pills and thirst.   THN CM Short Term Goal #2 (0-30 days)  Patient will be able to verbalize how to read the sodium content on labels within the next 30 days   THN CM Short Term Goal #2 Start Date  11/20/15 [continue/ Patient stated she was putting salt in instant grit water not realizing that they had na in them]   Interventions for Short Term Goal #2  RN sent patient a chart showing how to read labels. RN sent patient educational information on low sodium diet. RN sent educational information on dash diet. RN will follow up with discussion with each call    THN CM Short Term Goal #3 (0-30 days)  Patient will be able to verbalize the zones within the next 30 days   THN CM Short Term Goal #3 Start Date  11/20/15 [continous ongoing/continue to say the good one]   Interventions for Short Tern Goal #3  RN discussed what the Zones are. Patient has the magnet on the refrigerator. She can't remember the colors so describes the green as good zone. RN will reiterate each follow up out reach call what the colors mean.       Assessment:  Patient will continue to benefit from Health Coach telephonic outreach for education and support for diabetes self management.  Plan:  RN called Dr office and obtained name of the Podiatrist patient referred to RN called Dr Kendell Bane [facility] and made another appointment RN called patient and informed her of date being June 14th at 1:30 pm RN gave patient the address and informed her that she has time to make transportation arrangements RN will send patient educational material on peripheral edema RN sent additional educational information on Heart Failure checking your weight  daily RN sent additional information on Why do I need to weigh myself daily and When do I weigh RN sent another large sheet Zone paper for patient to review RN will follow up in 30 days with discussion and teach back  [place] [facility] [phone]

## 2015-11-28 ENCOUNTER — Other Ambulatory Visit: Payer: Self-pay | Admitting: Pulmonary Disease

## 2015-11-29 NOTE — Telephone Encounter (Signed)
i have called and left a message for [facility] nurse to call back and assist possibly with some transportation in order to get pt in for an office visit

## 2015-12-13 ENCOUNTER — Encounter (INDEPENDENT_AMBULATORY_CARE_PROVIDER_SITE_OTHER): Payer: Medicare Other | Admitting: Podiatry

## 2015-12-13 NOTE — Progress Notes (Signed)
This encounter was created in error - please disregard.

## 2015-12-19 ENCOUNTER — Other Ambulatory Visit: Payer: Self-pay | Admitting: *Deleted

## 2015-12-19 DIAGNOSIS — I5020 Unspecified systolic (congestive) heart failure: Secondary | ICD-10-CM

## 2015-12-19 NOTE — Patient Outreach (Signed)
[facility]) Care Management  [facility] Care Manager  12/19/2015   ADARA HAUTH Jun 21, 1929 [id number]  Subjective:RN Health Coach telephone call to patient.  Hipaa compliance verified. Per patient she did not make the appointment to the podiatrist. Per patient she didn't have a ride. RN Health Coach made the appointment and gave it to the patient in advance so she could make arrangements. Per patient she doesn't ride the bus because of her walker. Patient has to call her nephew or someone for transportation. Explained to patient that was her 2nd no show or call to cancel to the foot Dr.  Patient has some short term memory impairment and repeated herself several times during the conversation.  Patient states she is going to call and make an appointment for her eye exam and with her medical Dr. Theola Sequin asked patient what zone she is in. Patient doesn't remember what each zone is but does remember it is on the refrigerator. Per patient she does wear depend diapers because of urine leakage . Patient was able to state that the fluid pill makes her go a lot. Patient stated if she knows she has to go out she holds off taking her fluid pill. Per patient she is able to get her medications delivered because [facility] delivers for a small fee. Patient weight today was 158-160 range. RN asked patient what it actually was. Explained to patient that a range is not what she needs to write down. Made patient aware that 2 pounds in one day could be important. RN went over the gaining of 3 pounds in 1 day or 5 pounds in a week. RN went over the action plan. Patient stated she is not having any swelling in her legs. Patient stated she is not having any shortness of breath or coughing. Patient has agreed to follow up outreach.   Objective:   Encounter Medications:  Outpatient Encounter Prescriptions as of 12/19/2015  Medication Sig Note  . acetaminophen (TYLENOL) 325 MG tablet Take 650 mg by mouth every 6 (six)  hours as needed for mild pain.   Marland Kitchen albuterol (PROVENTIL HFA;VENTOLIN HFA) 108 (90 Base) MCG/ACT inhaler Inhale 2 puffs into the lungs every 4 (four) hours as needed for wheezing or shortness of breath.   Marland Kitchen aspirin EC 81 MG tablet Take 81 mg by mouth daily. 01/01/2013: .....  . atorvastatin (LIPITOR) 40 MG tablet Take 1 tablet (40 mg total) by mouth daily at 6 PM.   . bisoprolol (ZEBETA) 5 MG tablet take 1/2 tablet by mouth once daily   . cyanocobalamin (,VITAMIN B-12,) 1000 MCG/ML injection Inject 1 mL (1,000 mcg total) into the muscle every 30 (thirty) days.   . furosemide (LASIX) 40 MG tablet Take 1 tablet (40 mg total) by mouth daily.   . hydroxypropyl methylcellulose / hypromellose (ISOPTO TEARS / GONIOVISC) 2.5 % ophthalmic solution Place 1 drop into the left eye 4 (four) times daily as needed for dry eyes. 10/16/2014: .  . isosorbide mononitrate (IMDUR) 30 MG 24 hr tablet Take 1 tablet (30 mg total) by mouth daily.   Marland Kitchen losartan (COZAAR) 50 MG tablet take 1 tablet by mouth once daily   . potassium chloride SA (K-DUR,KLOR-CON) 20 MEQ tablet take 1 tablet by mouth once daily    No facility-administered encounter medications on file as of 12/19/2015.    Functional Status:  In your present state of health, do you have any difficulty performing the following activities: 12/19/2015 11/20/2015  Hearing? N  N  Vision? N N  Difficulty concentrating or making decisions? Tempie Donning  Walking or climbing stairs? Y Y  Dressing or bathing? Y Y  Doing errands, shopping? - Y  Preparing Food and eating ? N N  Using the Toilet? N N  In the past six months, have you accidently leaked urine? Y Y  Do you have problems with loss of bowel control? N N  Managing your Medications? N N  Managing your Finances? N N  Housekeeping or managing your Housekeeping? Tempie Donning    Fall/Depression Screening: PHQ 2/9 Scores 12/19/2015 11/20/2015 10/23/2015 09/19/2015 09/06/2015 08/31/2015 08/18/2015  PHQ - 2 Score 0 0 0 0 0 0 0   THN CM Care  Plan Problem One        Most Recent Value   Care Plan Problem One  Knowledge Deficit in Self Management of Congestive Heart Failure   Role Documenting the Problem One  [place] for Problem One  Active   THN Long Term Goal (31-90 days)  Patient will continue to not have any admissions within the next 90 days   THN Long Term Goal Start Date  12/19/15 [Contiinue/Patient forgets her appointments depite writing the date and time down. Patient also has problems with transportation.]   Interventions for Problem One [place] reminded patient to keep appointments with primary care physician and cardiologist. [place] reminded patient to take medications nas per physician order, RN will follow up with patient within a month.   THN CM Short Term Goal #1 (0-30 days)  Patient will report weighing and recording daily within the next 30 days   THN CM Short Term Goal #1 Start Date  12/19/15 [continue. patient always gives the report of 158-160 explained to patient that she needs to use the exact numbers and not the range]   Interventions for Short Term Goal #1  RN Health Coach reiteraterated the importance of weighing each morning at the same time and documenting in calendar book . RN sent patient EMMI information on Keeping  track of your weight each day. Understanding water pills and thirst.   THN CM Short Term Goal #2 (0-30 days)  Patient will be able to verbalize how to read the sodium content on labels within the next 30 days   THN CM Short Term Goal #2 Start Date  12/19/15 [Continue/patient stated she got the chart but she doesn't add any salt to her food.]   Interventions for Short Term Goal #2  RN sent patient a chart showing how to read labels. RN sent patient educational information on low sodium diet. RN sent educational information on dash diet. RN will follow up with discussion with each call    THN CM Short Term Goal #3 (0-30 days)  Patient will be able to  verbalize the zones within the next 30 days   THN CM Short Term Goal #3 Start Date  12/19/15 [continue/Patient states she has it on the refrigerator but she doesn't remember what they are without looking at it.]   Interventions for Short Tern Goal #3  RN discussed what the Zones are. Patient has the magnet on the refrigerator. She can't remember the colors so describes the green as good zone. RN will reiterate each follow up out reach call what the colors mean.      Assessment:  Patient has not been keeping her appointments due to transportation issues. Patient has short term memory  impairment and forgets appointments.  Plan:  RN will refer to social worker for transportation needs RN will send additional information on check up list and attach a magnet RN will send educational material on low sodium diet RN will follow up within 30 days for discussion.  [place] Care Management [phone]

## 2015-12-20 ENCOUNTER — Telehealth: Payer: Self-pay | Admitting: *Deleted

## 2015-12-20 NOTE — Telephone Encounter (Signed)
[facility] RN calls and states she is returning a call from may that triage made in may about helping pt with transportation to office visit, states she has done a csw request today but has concerns that pt is remembering what she needs to and if she is getting food when she needs it. Will send to shana csw for f/u

## 2015-12-21 ENCOUNTER — Encounter: Payer: Self-pay | Admitting: Licensed Clinical Social Worker

## 2015-12-21 ENCOUNTER — Encounter: Payer: Self-pay | Admitting: *Deleted

## 2015-12-21 ENCOUNTER — Other Ambulatory Visit: Payer: Self-pay | Admitting: *Deleted

## 2015-12-21 ENCOUNTER — Telehealth: Payer: Self-pay | Admitting: Licensed Clinical Social Worker

## 2015-12-21 NOTE — Patient Outreach (Signed)
[place] [facility]) Care Management  12/21/2015  ELEONOR KARIM Feb 12, 1929 [id number]   RN Health Coach telephone call to patient.  Hipaa compliance verified. RN talked with Mariane Baumgarten and informed her that she has a Dr appointment at Dr Randell Patient office on January 11, 2016 and she needs to be there by 3:30. RN explained that she needs to call someone now to start making arrangements for transportation to the Dr office. Patient stated I will get there even if I have to call a cab. It is cheaper for me anyway than having to pay someone else. Patient refuses to ride the transportation provided. Per patient it takes to long for the [place] buses to come back and pick her up.  [place] Care Management [phone]

## 2015-12-21 NOTE — Telephone Encounter (Signed)
CSW placed call to Ms. Maines to confirm current services and needs in the home. No answer and no voice mail.  CSW called pt's mobile number, which is her sister's phone number.  Sister lives down the hall and states "she don't want nobody coming in her place and messing with her stuff.  She can make it pretty good now, but will eventually need someone to come."  CSW will attempt to contact pt and encourage pt to receive available resources which include PCS and medicaid medical transportation.  [facility] CSW has been consulted.

## 2015-12-21 NOTE — Telephone Encounter (Signed)
CSW placed call to Ms. Munley.  Pt declines need for services.  Ms. Nayar states she prepares her own food and "take my medication everyday like I'm supposed to."  Pt states she has a small apartment and is able to maintain the apartment.  Sister drives and will pick up groceries for patient as needed.  Pt states her nephew, [name] assist her with transportation to/from medical appointments.  CSW inquired if pt wanted to add Mr. [name] to the list as a contact for appointment, pt declines stating she lets him know when she has appointments.  Ms. Tivis is aware of her benefit of Medicaid medical transportation but prefers not to use them due to excessive wait times.  CSW offered to mail Ms. Tester a calendar with July's scheduled appointment.  Pt thankful and denies add'l social work needs at this time.

## 2015-12-21 NOTE — Telephone Encounter (Signed)
Thank you. See CSW telephone note 12/21/15.  [facility] CSW referral completed by [facility].

## 2015-12-22 ENCOUNTER — Encounter: Payer: Self-pay | Admitting: *Deleted

## 2015-12-27 ENCOUNTER — Encounter (HOSPITAL_COMMUNITY): Payer: Self-pay

## 2015-12-27 ENCOUNTER — Observation Stay (HOSPITAL_COMMUNITY)
Admission: EM | Admit: 2015-12-27 | Discharge: 2015-12-28 | Disposition: A | Discharge status: Home / Self Care | Payer: Medicare Other | Attending: Student in an Organized Health Care Education/Training Program | Admitting: Student in an Organized Health Care Education/Training Program

## 2015-12-27 ENCOUNTER — Emergency Department (HOSPITAL_COMMUNITY): Payer: Medicare Other

## 2015-12-27 DIAGNOSIS — Z87891 Personal history of nicotine dependence: Secondary | ICD-10-CM | POA: Insufficient documentation

## 2015-12-27 DIAGNOSIS — R079 Chest pain, unspecified: Secondary | ICD-10-CM | POA: Insufficient documentation

## 2015-12-27 DIAGNOSIS — I25118 Atherosclerotic heart disease of native coronary artery with other forms of angina pectoris: Secondary | ICD-10-CM | POA: Diagnosis not present

## 2015-12-27 DIAGNOSIS — I509 Heart failure, unspecified: Secondary | ICD-10-CM | POA: Diagnosis not present

## 2015-12-27 DIAGNOSIS — I10 Essential (primary) hypertension: Secondary | ICD-10-CM | POA: Diagnosis present

## 2015-12-27 DIAGNOSIS — E785 Hyperlipidemia, unspecified: Secondary | ICD-10-CM | POA: Diagnosis not present

## 2015-12-27 DIAGNOSIS — R0602 Shortness of breath: Secondary | ICD-10-CM | POA: Diagnosis not present

## 2015-12-27 DIAGNOSIS — Z79899 Other long term (current) drug therapy: Secondary | ICD-10-CM | POA: Insufficient documentation

## 2015-12-27 DIAGNOSIS — N183 Chronic kidney disease, stage 3 (moderate): Secondary | ICD-10-CM | POA: Diagnosis not present

## 2015-12-27 DIAGNOSIS — R031 Nonspecific low blood-pressure reading: Secondary | ICD-10-CM | POA: Diagnosis not present

## 2015-12-27 DIAGNOSIS — F0390 Unspecified dementia without behavioral disturbance: Secondary | ICD-10-CM | POA: Diagnosis not present

## 2015-12-27 DIAGNOSIS — Z955 Presence of coronary angioplasty implant and graft: Secondary | ICD-10-CM

## 2015-12-27 DIAGNOSIS — R2689 Other abnormalities of gait and mobility: Secondary | ICD-10-CM | POA: Diagnosis not present

## 2015-12-27 DIAGNOSIS — K219 Gastro-esophageal reflux disease without esophagitis: Secondary | ICD-10-CM | POA: Insufficient documentation

## 2015-12-27 DIAGNOSIS — R070 Pain in throat: Secondary | ICD-10-CM | POA: Diagnosis not present

## 2015-12-27 DIAGNOSIS — J029 Acute pharyngitis, unspecified: Secondary | ICD-10-CM | POA: Insufficient documentation

## 2015-12-27 DIAGNOSIS — I130 Hypertensive heart and chronic kidney disease with heart failure and stage 1 through stage 4 chronic kidney disease, or unspecified chronic kidney disease: Secondary | ICD-10-CM | POA: Insufficient documentation

## 2015-12-27 DIAGNOSIS — D61818 Other pancytopenia: Secondary | ICD-10-CM | POA: Diagnosis not present

## 2015-12-27 DIAGNOSIS — R0609 Other forms of dyspnea: Secondary | ICD-10-CM

## 2015-12-27 DIAGNOSIS — N189 Chronic kidney disease, unspecified: Secondary | ICD-10-CM | POA: Diagnosis present

## 2015-12-27 DIAGNOSIS — R05 Cough: Secondary | ICD-10-CM | POA: Diagnosis not present

## 2015-12-27 DIAGNOSIS — R0789 Other chest pain: Secondary | ICD-10-CM | POA: Diagnosis not present

## 2015-12-27 LAB — CBC WITH DIFFERENTIAL/PLATELET
Basophils Absolute: 0 10*3/uL (ref 0.0–0.1)
Basophils Relative: 0 %
Eosinophils Absolute: 0 10*3/uL (ref 0.0–0.7)
Eosinophils Relative: 0 %
HEMATOCRIT: 29.5 % — AB (ref 36.0–46.0)
HEMOGLOBIN: 10 g/dL — AB (ref 12.0–15.0)
LYMPHS ABS: 1.3 10*3/uL (ref 0.7–4.0)
Lymphocytes Relative: 38 %
MCH: 30.9 pg (ref 26.0–34.0)
MCHC: 33.9 g/dL (ref 30.0–36.0)
MCV: 91 fL (ref 78.0–100.0)
MONOS PCT: 10 %
Monocytes Absolute: 0.3 10*3/uL (ref 0.1–1.0)
NEUTROS ABS: 1.8 10*3/uL (ref 1.7–7.7)
NEUTROS PCT: 52 %
Platelets: 101 10*3/uL — ABNORMAL LOW (ref 150–400)
RBC: 3.24 MIL/uL — AB (ref 3.87–5.11)
RDW: 13 % (ref 11.5–15.5)
WBC: 3.4 10*3/uL — ABNORMAL LOW (ref 4.0–10.5)

## 2015-12-27 LAB — COMPREHENSIVE METABOLIC PANEL
ALBUMIN: 4.7 g/dL (ref 3.5–5.0)
ALK PHOS: 59 U/L (ref 38–126)
ALT: 15 U/L (ref 14–54)
ANION GAP: 9 (ref 5–15)
AST: 22 U/L (ref 15–41)
BILIRUBIN TOTAL: 1.7 mg/dL — AB (ref 0.3–1.2)
BUN: 51 mg/dL — ABNORMAL HIGH (ref 6–20)
CALCIUM: 9.2 mg/dL (ref 8.9–10.3)
CO2: 21 mmol/L — ABNORMAL LOW (ref 22–32)
CREATININE: 1.41 mg/dL — AB (ref 0.44–1.00)
Chloride: 112 mmol/L — ABNORMAL HIGH (ref 101–111)
GFR calc Af Amer: 38 mL/min — ABNORMAL LOW (ref >60)
GFR calc non Af Amer: 33 mL/min — ABNORMAL LOW (ref >60)
GLUCOSE: 94 mg/dL (ref 65–99)
Potassium: 3.8 mmol/L (ref 3.5–5.1)
Sodium: 142 mmol/L (ref 135–145)
TOTAL PROTEIN: 7.4 g/dL (ref 6.5–8.1)

## 2015-12-27 LAB — RAPID STREP SCREEN (MED CTR MEBANE ONLY): STREPTOCOCCUS, GROUP A SCREEN (DIRECT): NEGATIVE

## 2015-12-27 LAB — URINALYSIS, ROUTINE W REFLEX MICROSCOPIC
Bilirubin Urine: NEGATIVE
Glucose, UA: NEGATIVE mg/dL
Hgb urine dipstick: NEGATIVE
KETONES UR: NEGATIVE mg/dL
LEUKOCYTES UA: NEGATIVE
Nitrite: NEGATIVE
PROTEIN: NEGATIVE mg/dL
Specific Gravity, Urine: 1.016 (ref 1.005–1.030)
pH: 5.5 (ref 5.0–8.0)

## 2015-12-27 LAB — SAVE SMEAR

## 2015-12-27 LAB — TROPONIN I: Troponin I: 0.03 ng/mL (ref <0.03)

## 2015-12-27 LAB — I-STAT TROPONIN, ED: Troponin i, poc: 0 ng/mL (ref 0.00–0.08)

## 2015-12-27 LAB — BRAIN NATRIURETIC PEPTIDE: B NATRIURETIC PEPTIDE 5: 84.3 pg/mL (ref 0.0–100.0)

## 2015-12-27 MED ORDER — POTASSIUM CHLORIDE CRYS ER 20 MEQ PO TBCR
20.0000 meq | EXTENDED_RELEASE_TABLET | Freq: Every day | ORAL | Status: DC
  Administered 2015-12-27 – 2015-12-28 (×2): 20 meq via ORAL
  Filled 2015-12-27 (×2): qty 1

## 2015-12-27 MED ORDER — ADULT MULTIVITAMIN W/MINERALS CH
1.0000 | ORAL_TABLET | Freq: Every day | ORAL | Status: DC
  Administered 2015-12-27 – 2015-12-28 (×2): 1 via ORAL
  Filled 2015-12-27 (×2): qty 1

## 2015-12-27 MED ORDER — ACETAMINOPHEN 650 MG RE SUPP: 650.0000 mg | Freq: Four times a day (QID) | RECTAL | Status: DC | PRN

## 2015-12-27 MED ORDER — SENNOSIDES-DOCUSATE SODIUM 8.6-50 MG PO TABS: 1.0000 | ORAL_TABLET | Freq: Every evening | ORAL | Status: DC | PRN

## 2015-12-27 MED ORDER — LOSARTAN POTASSIUM 50 MG PO TABS
50.0000 mg | ORAL_TABLET | Freq: Every day | ORAL | Status: DC
  Administered 2015-12-27 – 2015-12-28 (×2): 50 mg via ORAL
  Filled 2015-12-27 (×2): qty 1

## 2015-12-27 MED ORDER — HYPROMELLOSE (GONIOSCOPIC) 2.5 % OP SOLN
1.0000 [drp] | Freq: Four times a day (QID) | OPHTHALMIC | Status: DC | PRN
  Filled 2015-12-27: qty 15

## 2015-12-27 MED ORDER — ASPIRIN 81 MG PO CHEW
324.0000 mg | CHEWABLE_TABLET | Freq: Once | ORAL | Status: AC
  Administered 2015-12-27: 324 mg via ORAL
  Filled 2015-12-27: qty 4

## 2015-12-27 MED ORDER — BISOPROLOL FUMARATE 5 MG PO TABS
2.5000 mg | ORAL_TABLET | Freq: Every day | ORAL | Status: DC
  Administered 2015-12-27 – 2015-12-28 (×2): 2.5 mg via ORAL
  Filled 2015-12-27 (×2): qty 1

## 2015-12-27 MED ORDER — ACETAMINOPHEN 325 MG PO TABS
650.0000 mg | ORAL_TABLET | Freq: Four times a day (QID) | ORAL | Status: DC | PRN
  Administered 2015-12-27: 650 mg via ORAL
  Filled 2015-12-27: qty 2

## 2015-12-27 MED ORDER — ASPIRIN EC 81 MG PO TBEC
81.0000 mg | DELAYED_RELEASE_TABLET | Freq: Every day | ORAL | Status: DC
  Administered 2015-12-28: 81 mg via ORAL
  Filled 2015-12-27: qty 1

## 2015-12-27 MED ORDER — ISOSORBIDE MONONITRATE ER 30 MG PO TB24
30.0000 mg | ORAL_TABLET | Freq: Every day | ORAL | Status: DC
  Administered 2015-12-27 – 2015-12-28 (×2): 30 mg via ORAL
  Filled 2015-12-27 (×2): qty 1

## 2015-12-27 MED ORDER — SODIUM CHLORIDE 0.9 % IV SOLN
INTRAVENOUS | Status: AC
  Administered 2015-12-27: 17:00:00 via INTRAVENOUS

## 2015-12-27 MED ORDER — ALBUTEROL SULFATE (2.5 MG/3ML) 0.083% IN NEBU
2.5000 mg | INHALATION_SOLUTION | RESPIRATORY_TRACT | Status: DC | PRN
  Administered 2015-12-28: 2.5 mg via RESPIRATORY_TRACT
  Filled 2015-12-27 (×2): qty 3

## 2015-12-27 MED ORDER — ATORVASTATIN CALCIUM 40 MG PO TABS
40.0000 mg | ORAL_TABLET | Freq: Every day | ORAL | Status: DC
  Administered 2015-12-27: 40 mg via ORAL
  Filled 2015-12-27: qty 1

## 2015-12-27 NOTE — ED Notes (Signed)
Report given to [facility]. 

## 2015-12-27 NOTE — ED Notes (Signed)
Patient had difficulty describing throat pain, states it hurts off and on and she forgets things a lot.

## 2015-12-27 NOTE — ED Notes (Signed)
Pt complains of throat pain that woke her up in her sleep, she's also had a little cough

## 2015-12-27 NOTE — H&P (Signed)
Date: 12/27/2015               Patient Name:  Jane [name] MRN: [id number]  DOB: [birth year] Age / Sex: 82 y.o., female   PCP: Milagros Loll, MD           Medical Service: Internal Medicine Teaching Service         Attending Physician: Dr. Axel Filler, MD    First Contact: Dr. Benjamine Mola, MD Pager: [phone]  Second Contact: Dr. Hulen Luster, MD Pager: [phone]       After Hours (After 5p/  First Contact Pager: [phone]  weekends / holidays): Second Contact Pager: [phone]    Most Recent Discharge Date:  08/11/15  Chief Complaint:  Chief Complaint  Patient presents with  . Shortness of Breath       History of Present Illness:  Jane [name] is a 82 y.o. female who has a PMH of GERD, HTN, dyslipidemia, CAD s/p RCA DES (08/2014), CVA, CKD who presents to the [facility] with dyspnea.  She woke up this morning with a "burning sensation in my throat" and then I went to the bathroom and became short of breath.  She said she was told to come to the ED anytime she got "short of breath."  She reports getting short of breath especially when she "gets in a hurry."  Denies any fever/chills, diaphoresis, dizziness, cough, orthopnea, LE swelling, N/V, abdominal pain.  Weight is stable and is compliant with her medications.  Appetite is good.  She lives alone in an apartment at [facility] and is mostly independent with her ADLs (cooking, cleaning, bathing).  She ambulates with a walker when she lives her apartment.  Denies any recent falls.  She has a nephew who assists her with her bills.  She is a previous smoker but quit in 1980.  No alcohol or recreational drug use.    Of note, she has been admitted multiple times for the same and was felt dyspnea was due to deconditioning and possibly brilinta.  Brilinta was discontinued in February 2017.    In the ED, EKG without acute changes.  POC-troponin neg. BNP 84.3.  CXR with cardiomegaly and hyperinflation without acute changes.  No active chest pain.   Cardiologist is Dr. Martinique.    Meds: Current Facility-Administered Medications  Medication Dose Route Frequency Provider Last Rate Last Dose  . acetaminophen (TYLENOL) tablet 650 mg  650 mg Oral Q6H PRN Jones Bales, MD       Or  . acetaminophen (TYLENOL) suppository 650 mg  650 mg Rectal Q6H PRN Jones Bales, MD      . albuterol (PROVENTIL) (2.5 MG/3ML) 0.083% nebulizer solution 2.5 mg  2.5 mg Nebulization Q4H PRN Jones Bales, MD      . aspirin EC tablet 81 mg  81 mg Oral Daily Jones Bales, MD      . atorvastatin (LIPITOR) tablet 40 mg  40 mg Oral q1800 Jones Bales, MD      . bisoprolol (ZEBETA) tablet 2.5 mg  2.5 mg Oral Daily Jones Bales, MD      . hydroxypropyl methylcellulose / hypromellose (ISOPTO TEARS / GONIOVISC) 2.5 % ophthalmic solution 1 drop  1 drop Left Eye QID PRN Jones Bales, MD      . isosorbide mononitrate (IMDUR) 24 hr tablet 30 mg  30 mg Oral Daily Jones Bales, MD      . losartan (COZAAR) tablet 50 mg  50 mg Oral Daily Jones Bales, MD      . multivitamin with minerals tablet 1 tablet  1 tablet Oral Daily Jones Bales, MD      . potassium chloride SA (K-DUR,KLOR-CON) CR tablet 20 mEq  20 mEq Oral Daily Jones Bales, MD      . senna-docusate (Senokot-S) tablet 1 tablet  1 tablet Oral QHS PRN Jones Bales, MD        Prescriptions prior to admission  Medication Sig Dispense Refill Last Dose  . acetaminophen (TYLENOL) 325 MG tablet Take 650 mg by mouth every 6 (six) hours as needed for mild pain.   12/26/2015 at Unknown time  . albuterol (PROVENTIL HFA;VENTOLIN HFA) 108 (90 Base) MCG/ACT inhaler Inhale 2 puffs into the lungs every 4 (four) hours as needed for wheezing or shortness of breath. 3 Inhaler 0 12/26/2015 at Unknown time  . aspirin EC 81 MG tablet Take 81 mg by mouth daily.   12/26/2015 at Unknown time  . atorvastatin (LIPITOR) 40 MG tablet Take 1 tablet (40 mg total) by mouth daily at 6 PM. 30 tablet 11 12/26/2015 at  Unknown time  . bisoprolol (ZEBETA) 5 MG tablet take 1/2 tablet by mouth once daily 90 tablet 1 12/26/2015 at Unknown time  . furosemide (LASIX) 40 MG tablet Take 1 tablet (40 mg total) by mouth daily. 90 tablet 0 12/26/2015 at Unknown time  . hydroxypropyl methylcellulose / hypromellose (ISOPTO TEARS / GONIOVISC) 2.5 % ophthalmic solution Place 1 drop into the left eye 4 (four) times daily as needed for dry eyes. 15 mL 2 Past Week at Unknown time  . isosorbide mononitrate (IMDUR) 30 MG 24 hr tablet Take 1 tablet (30 mg total) by mouth daily. 90 tablet 0 12/26/2015 at Unknown time  . losartan (COZAAR) 50 MG tablet take 1 tablet by mouth once daily 30 tablet 0 12/26/2015 at Unknown time  . potassium chloride SA (K-DUR,KLOR-CON) 20 MEQ tablet take 1 tablet by mouth once daily 90 tablet 3 12/26/2015 at Unknown time  . cyanocobalamin (,VITAMIN B-12,) 1000 MCG/ML injection Inject 1 mL (1,000 mcg total) into the muscle every 30 (thirty) days. (Patient not taking: Reported on 12/27/2015) 1 mL 0 Taking    Allergies: Allergies as of 12/27/2015  . (No Known Allergies)    PMH: Past Medical History  Diagnosis Date  . Anemia   . GERD (gastroesophageal reflux disease) 2009    EGD with benign gastric polyp too  . Hyperlipidemia   . Hypertension   . Vitamin D deficiency   . Colon polyp     2009 colonoscopy, not retrieved for pathology  . Aortic insufficiency     mild  . Stroke ([place]) 1975  . CKD (chronic kidney disease) stage 3, GFR 30-59 ml/min   . CHF (congestive heart failure) ([place])   . CAD (coronary artery disease)     cath 08/09/2014 95% stenosis in prox to mid RCA s/p DES, 80-90% prox OM2, 50% distal LAD  . Ectropion of left lower eyelid   . Arthritis     "arms" (08/09/2015)    PSH: Past Surgical History  Procedure Laterality Date  . Appendectomy    . Abdominal hysterectomy    . Artery biopsy Left 12/16/2012    Procedure: BIOPSY TEMPORAL ARTERY;  Surgeon: Mal Misty, MD;  Location: [facility];   Service: Vascular;  Laterality: Left;  . Left heart catheterization with coronary angiogram N/A 08/09/2014    Procedure: LEFT HEART  CATHETERIZATION WITH CORONARY ANGIOGRAM;  Surgeon: Peter M Martinique, MD;  Location: [facility] CATH LAB;  Service: Cardiovascular;  Laterality: N/A;  . Percutaneous coronary stent intervention (pci-s)  08/09/2014    Procedure: PERCUTANEOUS CORONARY STENT INTERVENTION (PCI-S);  Surgeon: Peter M Martinique, MD;  Location: [facility] CATH LAB;  Service: Cardiovascular;;  . Cardiac catheterization    . Tonsillectomy      FH: Family History  Problem Relation Age of Onset  . Stroke Mother   . Hypertension Mother   . Stroke Father   . Hypertension Father   . Diabetes Sister     SH: Social History  Substance Use Topics  . Smoking status: Former Smoker    Types: Cigarettes    Quit date: 07/02/1979  . Smokeless tobacco: Former Systems developer  . Alcohol Use: No    Review of Systems: Pertinent items are noted in HPI.  All other systems reviewed and are negative.  Physical Exam: BP 140/60 mmHg  Pulse 68  Temp(Src) 98.3 F (36.8 C) (Oral)  Resp 15  Ht 5\' 4"  (1.626 m)  Wt 160 lb 4.8 oz (72.712 kg)  BMI 27.50 kg/m2  SpO2 99%  Physical Exam  Constitutional: Vital signs reviewed.  Patient is an pleasant elderly female in no acute distress and cooperative with exam.  Head: Normocephalic and atraumatic Eyes: EOMI. Neck: Supple, trachea midline, no JVD.  Mouth: Moist mucous membranes.  Cardiovascular: RRR. Pulmonary/Chest: normal respiratory effort, CTAB, no wheezes, rales, or rhonchi Abdominal: Soft. Non-tender, non-distended, bowel sounds are normal. Neurological: A&O x3, cranial nerve II-XII are grossly intact, moving all extremities.   Skin: Warm, dry and intact.  Extremities: Chronic venous stasis.   Psychiatric: Normal mood and affect.   Lab results:  Basic Metabolic Panel:  Recent Labs  12/27/15 0643  NA 142  K 3.8  CL 112*  CO2 21*  GLUCOSE 94  BUN 51*  CREATININE  1.41*  CALCIUM 9.2    Calcium/Magnesium/Phosphorus:  Recent Labs Lab 12/27/15 0643  CALCIUM 9.2    Liver Function Tests:  Recent Labs  12/27/15 0643  AST 22  ALT 15  ALKPHOS 59  BILITOT 1.7*  PROT 7.4  ALBUMIN 4.7   No results for input(s): LIPASE, AMYLASE in the last 72 hours. No results for input(s): AMMONIA in the last 72 hours.  CBC: Lab Results  Component Value Date   WBC 3.4* 12/27/2015   HGB 10.0* 12/27/2015   HCT 29.5* 12/27/2015   MCV 91.0 12/27/2015   PLT 101* 12/27/2015    Lipase: Lab Results  Component Value Date   LIPASE 15 09/09/2013    Lactic Acid/Procalcitonin: No results for input(s): LATICACIDVEN, PROCALCITON, O2SATVEN in the last 168 hours.  Cardiac Enzymes:  Recent Labs  12/27/15 0648  TROPIPOC 0.00   Lab Results  Component Value Date   TROPONINI <0.03 12/27/2015    BNP: No results for input(s): PROBNP in the last 72 hours.  D-Dimer: No results for input(s): DDIMER in the last 72 hours.  CBG: No results for input(s): GLUCAP in the last 72 hours.  Hemoglobin A1C: No results for input(s): HGBA1C in the last 72 hours.  Lipid Panel: No results for input(s): CHOL, HDL, LDLCALC, TRIG, CHOLHDL, LDLDIRECT in the last 72 hours.  Thyroid Function Tests: No results for input(s): TSH, T4TOTAL, FREET4, T3FREE, THYROIDAB in the last 72 hours.  Anemia Panel: No results for input(s): VITAMINB12, FOLATE, FERRITIN, TIBC, IRON, RETICCTPCT in the last 72 hours.  Coagulation: No results for input(s): LABPROT, INR  in the last 72 hours.  Urine Drug Screen: Drugs of Abuse:  No results found for: LABOPIA, COCAINSCRNUR, LABBENZ, AMPHETMU, THCU, LABBARB  Alcohol Level: No results for input(s): ETH in the last 72 hours.  Urinalysis:    Component Value Date/Time   COLORURINE YELLOW 02/05/2015 0555   APPEARANCEUR CLOUDY* 02/05/2015 0555   LABSPEC 1.013 02/05/2015 0555   PHURINE 5.5 02/05/2015 0555   GLUCOSEU NEGATIVE 02/05/2015 0555    HGBUR NEGATIVE 02/05/2015 0555   BILIRUBINUR NEGATIVE 02/05/2015 0555   KETONESUR NEGATIVE 02/05/2015 0555   PROTEINUR NEGATIVE 02/05/2015 0555   UROBILINOGEN 0.2 02/05/2015 0555   NITRITE NEGATIVE 02/05/2015 0555   LEUKOCYTESUR NEGATIVE 02/05/2015 0555    Imaging results:  Dg Chest 2 View  12/27/2015  CLINICAL DATA:  Sore throat this morning. Worsening cough over the past 2-3 days. EXAM: CHEST  2 VIEW COMPARISON:  08/09/2015 FINDINGS: Unchanged moderate cardiomegaly and hyperinflation no airspace consolidation. No effusions. Normal pulmonary vasculature. Unremarkable hilar and mediastinal contours. IMPRESSION: Cardiomegaly and hyperinflation.  No acute findings. Electronically Signed   By: Andreas Newport M.D.   On: 12/27/2015 06:25    EKG: EKG Interpretation  Date/Time:  Wednesday December 27 2015 06:32:04 EDT Ventricular Rate:  74 PR Interval:    QRS Duration: 109 QT Interval:  402 QTC Calculation: 446 R Axis:   20 Text Interpretation:  Sinus rhythm Abnormal R-wave progression, early transition Borderline T abnormalities, anterior leads No significant change since last tracing Confirmed by WARD,  DO, KRISTEN [phone]) on 12/27/2015 6:38:04 AM Also confirmed by WARD,  DO, KRISTEN [id number]), editor [place]  CCT, BEVERLY ([id number])  on 12/27/2015 6:56:00 AM   Antibiotics: Antibiotics Given (last 72 hours)    None      Anti-infectives    None     Consults:    Assessment & Plan by Problem: Principal Problem:   Dyspnea on exertion Active Problems:   Hyperlipidemia   Essential hypertension   GERD   Status post insertion of drug-eluting stent into right coronary artery for coronary artery disease   CKD (chronic kidney disease) stage 3, GFR 30-59 ml/min   Pancytopenia (HCC)  Dyspnea on exertion Possible etiologies include CHF, anemia, COPD, deconditioning. EKG and CXR without acute changes.  POC-trop neg.  BNP wnl.  TSH (2015) wnl.  Euvolemic on exam.  -cont pulse ox (with  ambulation)  -trend trops -EKG in AM -albuterol PRN  -PT/OT  Chest pain Describes as a burning sensation in her throat. -trend trops -repeat EKG in AM -GI cocktail  -cont PPI   Hypertension  Stable.  -cont home meds -hold lasix   GERD  Pt on chronic PPI at home. -cont home meds  Dyslipidemia  Pt is on statin.  Lab Results  Component Value Date   LDLCALC 131* 06/01/2014  -cont statin therapy  Pancytopenia -peripheral smear, retic count, LDH, uric acid -may need BM biopsy   Deconditioning  -PT/OT consult  FEN  Fluids-None Electrolytes-Replete as needed  Nutrition- Heart healthy  VTE prophylaxis  -SCDs for thrombocytopenia   Disposition Disposition deferred at this time, awaiting improvement of current medical problems. Anticipated discharge in approximately 1-2 day(s).    Emergency Contact Contact Information    Name [place] Son [phone]     Tatum,Cleatus Sister [phone]        The patient does have a current PCP Milagros Loll, MD) and does need an [facility] hospital follow-up appointment after discharge.  Signed Malachi Bonds  Ferd Glassing, MD Internal Medicine Teaching Service, PGY-3 12/27/2015, 1:17 PM

## 2015-12-27 NOTE — ED Provider Notes (Signed)
Care assumed from Delos Haring, [place].    Jane [name] is a 82 y.o. female presents with left sided chest pain with worsening SOB on exertion.  Pt lives alone.  Pt with signigicant cardiac hx including NSTEMI, CAD (Cath 2016 with 95% prox RCA with drug eluding stent), CKD, COPD, CVA, HTN on ASA and possible Brilinta.  Pt c/o sore throat, but is an unreliable historian due to her dementia.  She reports no CP at this time.    LEVEL 5 CAVEAT for dementia.    PCP: [facility] [place] Cardiology: Peter Martinique  ECHO 08/2014  Study Conclusions - Left ventricle: The cavity size was normal. Wall thickness was increased in a pattern of mild LVH. The estimated ejection fraction was 60%. Wall motion was normal; there were no regional wall motion abnormalities. - Aortic valve: There was mild regurgitation. - Right ventricle: The cavity size was normal. Systolic function was normal. - Pulmonary arteries: PA peak pressure: 29 mm Hg (S). - Pericardium, extracardiac: A trivial pericardial effusion was identified posterior to the heart.   Physical Exam  BP 123/64 mmHg  Pulse 86  Temp(Src) 98.6 F (37 C) (Oral)  Resp 20  Ht 5\' 4"  (1.626 m)  Wt 72.576 kg  BMI 27.45 kg/m2  SpO2 93%  Physical Exam   Face to face Exam:   General: Awake  HEENT: Atraumatic, ectropion of the left eye, rhinorrhea, throat clear  Resp: Normal effort, dry cough, clear and equal  Cardiac: RRR Abd: Nondistended, soft  Neuro:No focal weakness  MSK: moves extremities without ataxia,  Lymph: No adenopathy  Results for orders placed or performed during the hospital encounter of 12/27/15  Rapid strep screen (not at [facility])  Result Value Ref Range   Streptococcus, Group A Screen (Direct) NEGATIVE NEGATIVE  CBC with Differential  Result Value Ref Range   WBC 3.4 (L) 4.0 - 10.5 K/uL   RBC 3.24 (L) 3.87 - 5.11 MIL/uL   Hemoglobin 10.0 (L) 12.0 - 15.0 g/dL   HCT 29.5 (L) 36.0 - 46.0 %   MCV 91.0  78.0 - 100.0 fL   MCH 30.9 26.0 - 34.0 pg   MCHC 33.9 30.0 - 36.0 g/dL   RDW 13.0 11.5 - 15.5 %   Platelets 101 (L) 150 - 400 K/uL   Neutrophils Relative % 52 %   Neutro Abs 1.8 1.7 - 7.7 K/uL   Lymphocytes Relative 38 %   Lymphs Abs 1.3 0.7 - 4.0 K/uL   Monocytes Relative 10 %   Monocytes Absolute 0.3 0.1 - 1.0 K/uL   Eosinophils Relative 0 %   Eosinophils Absolute 0.0 0.0 - 0.7 K/uL   Basophils Relative 0 %   Basophils Absolute 0.0 0.0 - 0.1 K/uL  Comprehensive metabolic panel  Result Value Ref Range   Sodium 142 135 - 145 mmol/L   Potassium 3.8 3.5 - 5.1 mmol/L   Chloride 112 (H) 101 - 111 mmol/L   CO2 21 (L) 22 - 32 mmol/L   Glucose, Bld 94 65 - 99 mg/dL   BUN 51 (H) 6 - 20 mg/dL   Creatinine, Ser 1.41 (H) 0.44 - 1.00 mg/dL   Calcium 9.2 8.9 - 10.3 mg/dL   Total Protein 7.4 6.5 - 8.1 g/dL   Albumin 4.7 3.5 - 5.0 g/dL   AST 22 15 - 41 U/L   ALT 15 14 - 54 U/L   Alkaline Phosphatase 59 38 - 126 U/L   Total Bilirubin 1.7 (H)  0.3 - 1.2 mg/dL   GFR calc non Af Amer 33 (L) >60 mL/min   GFR calc Af Amer 38 (L) >60 mL/min   Anion gap 9 5 - 15  Brain natriuretic peptide  Result Value Ref Range   B Natriuretic Peptide 84.3 0.0 - 100.0 pg/mL  I-stat troponin, ED  Result Value Ref Range   Troponin i, poc 0.00 0.00 - 0.08 ng/mL   Comment 3           Dg Chest 2 View  12/27/2015  CLINICAL DATA:  Sore throat this morning. Worsening cough over the past 2-3 days. EXAM: CHEST  2 VIEW COMPARISON:  08/09/2015 FINDINGS: Unchanged moderate cardiomegaly and hyperinflation no airspace consolidation. No effusions. Normal pulmonary vasculature. Unremarkable hilar and mediastinal contours. IMPRESSION: Cardiomegaly and hyperinflation.  No acute findings. Electronically Signed   By: Andreas Newport M.D.   On: 12/27/2015 06:25        EKG Interpretation  Date/Time:  Wednesday December 27 2015 06:32:04 EDT Ventricular Rate:  74 PR Interval:    QRS Duration: 109 QT Interval:  402 QTC  Calculation: 446 R Axis:   20 Text Interpretation:  Sinus rhythm Abnormal R-wave progression, early transition Borderline T abnormalities, anterior leads No significant change since last tracing Confirmed by WARD,  DO, KRISTEN [id number]) on 12/27/2015 6:38:04 AM Also confirmed by WARD,  DO, KRISTEN [id number]), editor [place]  CCT, BEVERLY ([id number])  on 12/27/2015 6:56:00 AM        ED Course  Procedures   1. Chest pain, unspecified chest pain type   2. SOB (shortness of breath)   3. Coronary artery disease involving native coronary artery of native heart with other form of angina pectoris ([place])   4. Dementia, without behavioral disturbance     MDM Plan: Pt will need to be an overnight obs for CP rule out.    8:21 AM Labs reassuring.  Initial troponin negative.  Clear and equal breath sounds, no concern for COPD exacerbation.  BNP is not elevated and pt does not appear fluid overloaded.  CXR with Cardiomegaly and hyperinflation but no evidence of PNA or pulmonary edema.  Baseline anemia noted.  Pt without leukocytosis. Slightly elevated creatinine at 1.4 up from 1.2 previously.   Concern that her "sore throat"  The patient was discussed with and seen by Dr. Leonides Schanz who agrees with the treatment plan.  8:50 AM Pt discussed with Dr. Gordy Levan of internal medicine who will admit     Abigail Butts, PA-C 12/27/15 1536

## 2015-12-27 NOTE — ED Provider Notes (Signed)
CSN: [id number]     Arrival date & time 12/27/15  0443 History   First MD Initiated Contact with Patient 12/27/15 0515     Chief Complaint  Patient presents with  . Sore Throat     (Consider location/radiation/quality/duration/timing/severity/associated sxs/prior Treatment) HPI   PCP: Jacques Earthly, MD Jane [name] female87 y.o. PMH: cardiac disease, stroke, hypertension, (dementia)  Patient lives a lone in a single bedroom apartment [address]. Jane [name] has a hard time remembering some details this morning and it is unclear if this is baseline. Jane [name] has had cough, nasal congestion and sore throat for what Jane [name] believes is likely the last few days. Jane [name] has tried a throat spray but it isn't helping. Jane [name] says that Jane [name] called EMS because Jane [name] was coughing up a lot of phlegm and her throat was hurting badly. Admits that it currently is a little bit better.  ROS: The patient denies abdominal pains, N/V/D, gas, dysuria, abnormal  bleeding,fever, headache, weakness (general or focal), confusion, change of vision,  dysphagia, aphagia, , lower extremity swelling, rash, neck pain, chest pain, shortness of breath,  back pain.  Past Medical History  Diagnosis Date  . Anemia   . GERD (gastroesophageal reflux disease) 2009    EGD with benign gastric polyp too  . Hyperlipidemia   . Hypertension   . Vitamin D deficiency   . Colon polyp     2009 colonoscopy, not retrieved for pathology  . Aortic insufficiency     mild  . Stroke ([place]) 1975  . CKD (chronic kidney disease) stage 3, GFR 30-59 ml/min   . CHF (congestive heart failure) ([place])   . CAD (coronary artery disease)     cath 08/09/2014 95% stenosis in prox to mid RCA s/p DES, 80-90% prox OM2, 50% distal LAD  . Ectropion of left lower eyelid   . Arthritis     "arms" (08/09/2015)   Past Surgical History  Procedure Laterality Date  . Appendectomy    . Abdominal hysterectomy    . Artery biopsy Left 12/16/2012    Procedure: BIOPSY TEMPORAL  ARTERY;  Surgeon: Mal Misty, MD;  Location: [facility];  Service: Vascular;  Laterality: Left;  . Left heart catheterization with coronary angiogram N/A 08/09/2014    Procedure: LEFT HEART CATHETERIZATION WITH CORONARY ANGIOGRAM;  Surgeon: Peter M Martinique, MD;  Location: [facility] CATH LAB;  Service: Cardiovascular;  Laterality: N/A;  . Percutaneous coronary stent intervention (pci-s)  08/09/2014    Procedure: PERCUTANEOUS CORONARY STENT INTERVENTION (PCI-S);  Surgeon: Peter M Martinique, MD;  Location: [facility] CATH LAB;  Service: Cardiovascular;;  . Cardiac catheterization    . Tonsillectomy     Family History  Problem Relation Age of Onset  . Stroke Mother   . Hypertension Mother   . Stroke Father   . Hypertension Father   . Diabetes Sister    Social History  Substance Use Topics  . Smoking status: Former Smoker    Types: Cigarettes    Quit date: 07/02/1979  . Smokeless tobacco: Former Systems developer  . Alcohol Use: No   OB History    No data available     Review of Systems  Review of Systems All other systems negative except as documented in the HPI. All pertinent positives and negatives as reviewed in the HPI.   Allergies  Review of patient's allergies indicates no known allergies.  Home Medications   Prior to Admission medications   Medication Sig Start Date End Date Taking? Authorizing  Provider  acetaminophen (TYLENOL) 325 MG tablet Take 650 mg by mouth every 6 (six) hours as needed for mild pain.    Historical Provider, MD  albuterol (PROVENTIL HFA;VENTOLIN HFA) 108 (90 Base) MCG/ACT inhaler Inhale 2 puffs into the lungs every 4 (four) hours as needed for wheezing or shortness of breath. 08/14/15   Milagros Loll, MD  aspirin EC 81 MG tablet Take 81 mg by mouth daily.    Historical Provider, MD  atorvastatin (LIPITOR) 40 MG tablet Take 1 tablet (40 mg total) by mouth daily at 6 PM. 08/18/15   Lucious Groves, DO  bisoprolol (ZEBETA) 5 MG tablet take 1/2 tablet by mouth once daily 10/26/15    Milagros Loll, MD  cyanocobalamin (,VITAMIN B-12,) 1000 MCG/ML injection Inject 1 mL (1,000 mcg total) into the muscle every 30 (thirty) days. 12/08/14   Milagros Loll, MD  furosemide (LASIX) 40 MG tablet Take 1 tablet (40 mg total) by mouth daily. 08/14/15   Milagros Loll, MD  hydroxypropyl methylcellulose / hypromellose (ISOPTO TEARS / GONIOVISC) 2.5 % ophthalmic solution Place 1 drop into the left eye 4 (four) times daily as needed for dry eyes. 04/12/14   Noland Fordyce, PA-C  isosorbide mononitrate (IMDUR) 30 MG 24 hr tablet Take 1 tablet (30 mg total) by mouth daily. 08/14/15   Milagros Loll, MD  losartan (COZAAR) 50 MG tablet take 1 tablet by mouth once daily 11/30/15   Milagros Loll, MD  potassium chloride SA (K-DUR,KLOR-CON) 20 MEQ tablet take 1 tablet by mouth once daily 08/03/14   Luan Moore, MD   BP 123/64 mmHg  Pulse 86  Temp(Src) 98.6 F (37 C) (Oral)  Resp 20  Ht 5\' 4"  (1.626 m)  Wt 72.576 kg  BMI 27.45 kg/m2  SpO2 93% Physical Exam  Constitutional: Jane [name] appears well-developed and well-nourished. No distress.  HENT:  Head: Normocephalic and atraumatic.  Right Ear: Tympanic membrane and ear canal normal.  Left Ear: Tympanic membrane and ear canal normal.  + nasal congestion + cerumen bilaterally, visualized portions of TM unremarkable. +Erythematous posterior pharynx with edema.  Eyes: Pupils are equal, round, and reactive to light.  Ectropion to let eye  Neck: Normal range of motion. Neck supple.  Cardiovascular: Normal rate and regular rhythm.   Pulmonary/Chest: Effort normal and breath sounds normal. Jane [name] has no decreased breath sounds. Jane [name] has no wheezes. Jane [name] has no rales.  + cough during exam with clear/yellow sputum production.  Abdominal: Soft. Bowel sounds are normal. There is no tenderness. There is no rigidity and no guarding.  Musculoskeletal:  No LE swelling.  Lymphadenopathy:  Tenderness to tonsillar nodes bilaterally.  Neurological: Jane [name] is alert.   Skin: Skin is warm and dry.  Nursing note and vitals reviewed.   ED Course  Procedures (including critical care time) Labs Review Labs Reviewed  RAPID STREP SCREEN (NOT AT [facility])  URINALYSIS, ROUTINE W REFLEX MICROSCOPIC (NOT AT [facility])  CBC WITH DIFFERENTIAL/PLATELET  COMPREHENSIVE METABOLIC PANEL  BRAIN NATRIURETIC PEPTIDE  I-STAT [place], ED    Imaging Review Dg Chest 2 View  12/27/2015  CLINICAL DATA:  Sore throat this morning. Worsening cough over the past 2-3 days. EXAM: CHEST  2 VIEW COMPARISON:  08/09/2015 FINDINGS: Unchanged moderate cardiomegaly and hyperinflation no airspace consolidation. No effusions. Normal pulmonary vasculature. Unremarkable hilar and mediastinal contours. IMPRESSION: Cardiomegaly and hyperinflation.  No acute findings. Electronically Signed   By: Andreas Newport M.D.   On: 12/27/2015 06:25  I have personally reviewed and evaluated these images and lab results as part of my medical decision-making.   EKG Interpretation None      MDM   Final diagnoses:  None    Dr. Leonides Schanz is going to see patient. At end of shift patient sign out to [facility], PA-C.  6:30 am Dr. Leonides Schanz has seen the patient as well. The patient remembered that Jane [name] called EMS because Jane [name] was having left sided CP with worsened shortness of breath. Jane [name] is a [facility] patient. Will initiate cardiac work-up.  Blood pressure 123/64, pulse 86, temperature 98.6 F (37 C), temperature source Oral, resp. rate 20, height 5\' 4"  (1.626 m), weight 72.576 kg, SpO2 93 %.   Delos Haring, PA-C 12/27/15 [place], DO 12/27/15 [phone]

## 2015-12-27 NOTE — Evaluation (Signed)
Physical Therapy Evaluation Patient Details Name: Jane [name] MRN: [id number] DOB: 1928-08-03 Today's Date: 12/27/2015   History of Present Illness  Patient is a 82 y/o female with hx of CAD s/p NSTEMI, s/p DES stent in RCA, CHF, COPD, HTN, vitamin D deficiency, anemia and GERD presents with SOB. CXR-Cardiomegaly and hyperinflation.  Clinical Impression  Patient presents with deconditioning, generalized weakness and decreased endurance impacting mobility. Tolerated gait training with Min guard for safety. Pt lives alone and is Mod I for ADLs. Gets PRN assist from nephew. Would really benefit from [facility] aide to assist with IADLs and bathing as pt not safe to get into tub without assist. Will follow acutely to maximize independence and mobility prior to return home.     Follow Up Recommendations No PT follow up;Supervision - Intermittent ([place] aide)    Equipment Recommendations  None recommended by PT    Recommendations for Other Services       Precautions / Restrictions Precautions Precautions: Fall Restrictions Weight Bearing Restrictions: No      Mobility  Bed Mobility               General bed mobility comments: Sitting EOB upon PT arrival.  Transfers Overall transfer level: Needs assistance Equipment used: Rolling walker (2 wheeled) Transfers: Sit to/from Stand Sit to Stand: Min guard         General transfer comment: LOB upon standing on first attempt due to slick slippers, able to stand on second bout without difficulty, no assist needed.  Ambulation/Gait Ambulation/Gait assistance: Min guard Ambulation Distance (Feet): 100 Feet Assistive device: Rolling walker (2 wheeled) Gait Pattern/deviations: Step-through pattern;Decreased stride length;Trunk flexed Gait velocity: decreased   General Gait Details: Slow, steady gait. Cues for upright. No LOB. mild DOE. HR stable.  Stairs            Wheelchair Mobility    Modified Rankin (Stroke Patients  Only)       Balance Overall balance assessment: Needs assistance Sitting-balance support: Feet supported;No upper extremity supported Sitting balance-Leahy Scale: Good     Standing balance support: During functional activity Standing balance-Leahy Scale: Poor Standing balance comment: Reliant on BUEs for support.                             Pertinent Vitals/Pain Pain Assessment: No/denies pain    Home Living Family/patient expects to be discharged to:: Private residence Living Arrangements: Alone Available Help at Discharge: Family;Available PRN/intermittently Type of Home: Apartment Home Access: Elevator     Home Layout: One level Home Equipment: Tub bench;Walker - 2 wheels;Shower seat      Prior Function Level of Independence: Needs assistance   Gait / Transfers Assistance Needed: Furniture walker at baseline; uses RW for community ambulation  ADL's / Homemaking Assistance Needed: Nephew helps with meals, driving. Sponge bathes.        Hand Dominance   Dominant Hand: Right    Extremity/Trunk Assessment   Upper Extremity Assessment: Defer to OT evaluation           Lower Extremity Assessment: Generalized weakness         Communication   Communication: No difficulties  Cognition Arousal/Alertness: Awake/alert Behavior During Therapy: WFL for tasks assessed/performed Overall Cognitive Status: Within Functional Limits for tasks assessed                      General Comments      Exercises  Assessment/Plan    PT Assessment Patient needs continued PT services  PT Diagnosis Generalized weakness   PT Problem List Decreased strength;Decreased balance;Decreased mobility;Decreased activity tolerance  PT Treatment Interventions Balance training;Gait training;Functional mobility training;Therapeutic activities;Therapeutic exercise;Patient/family education   PT Goals (Current goals can be found in the Care Plan section)  Acute Rehab PT Goals Patient Stated Goal: "to keep doing for myself as long as I can" PT Goal Formulation: With patient Time For Goal Achievement: 01/10/16 Potential to Achieve Goals: Good    Frequency Min 3X/week   Barriers to discharge Decreased caregiver support lives alone    Co-evaluation               End of Session Equipment Utilized During Treatment: Gait belt Activity Tolerance: Patient tolerated treatment well Patient left: in bed;with call bell/phone within reach;with bed alarm set (sitting EOB.) Nurse Communication: Mobility status    Functional Assessment Tool Used: clinical judgment Functional Limitation: Mobility: Walking and moving around Mobility: Walking and Moving Around Current Status [id number]): At least 1 percent but less than 20 percent impaired, limited or restricted Mobility: Walking and Moving Around Goal Status [phone]): At least 1 percent but less than 20 percent impaired, limited or restricted    Time: 1546-1610 PT Time Calculation (min) (ACUTE ONLY): 24 min   Charges:   PT Evaluation $PT Eval Moderate Complexity: 1 Procedure PT Treatments $Gait Training: 8-22 mins   PT G Codes:   PT G-Codes **NOT FOR INPATIENT CLASS** Functional Assessment Tool Used: clinical judgment Functional Limitation: Mobility: Walking and moving around Mobility: Walking and Moving Around Current Status [id number]): At least 1 percent but less than 20 percent impaired, limited or restricted Mobility: Walking and Moving Around Goal Status [phone]): At least 1 percent but less than 20 percent impaired, limited or restricted    [place] 12/27/2015, 4:24 PM Wray Kearns, [place], DPT [phone]

## 2015-12-28 DIAGNOSIS — R0609 Other forms of dyspnea: Secondary | ICD-10-CM

## 2015-12-28 DIAGNOSIS — R0789 Other chest pain: Secondary | ICD-10-CM | POA: Diagnosis not present

## 2015-12-28 LAB — RETICULOCYTES
RBC.: 2.94 MIL/uL — ABNORMAL LOW (ref 3.87–5.11)
Retic Count, Absolute: 26.5 10*3/uL (ref 19.0–186.0)
Retic Ct Pct: 0.9 % (ref 0.4–3.1)

## 2015-12-28 LAB — BASIC METABOLIC PANEL
Anion gap: 6 (ref 5–15)
BUN: 41 mg/dL — AB (ref 6–20)
CALCIUM: 8.7 mg/dL — AB (ref 8.9–10.3)
CO2: 21 mmol/L — AB (ref 22–32)
CREATININE: 1.3 mg/dL — AB (ref 0.44–1.00)
Chloride: 114 mmol/L — ABNORMAL HIGH (ref 101–111)
GFR calc non Af Amer: 36 mL/min — ABNORMAL LOW (ref >60)
GFR, EST AFRICAN AMERICAN: 42 mL/min — AB (ref >60)
GLUCOSE: 92 mg/dL (ref 65–99)
Potassium: 4 mmol/L (ref 3.5–5.1)
Sodium: 141 mmol/L (ref 135–145)

## 2015-12-28 LAB — URIC ACID: Uric Acid, Serum: 8.5 mg/dL — ABNORMAL HIGH (ref 2.3–6.6)

## 2015-12-28 LAB — TROPONIN I

## 2015-12-28 LAB — LACTATE DEHYDROGENASE: LDH: 165 U/L (ref 98–192)

## 2015-12-28 LAB — PATHOLOGIST SMEAR REVIEW

## 2015-12-28 MED ORDER — SENNOSIDES-DOCUSATE SODIUM 8.6-50 MG PO TABS: 1.0000 | ORAL_TABLET | Freq: Every evening | ORAL | Status: DC | PRN

## 2015-12-28 NOTE — Care Management Note (Addendum)
Case Management Note  Patient Details  Name: JAZELL ALLPORT MRN: [id number] Date of Birth: [birth year]  Subjective/Objective:   82 y.o. F admitted 12/27/2015 with CP and SOB. Pt tells me she lives alone in a [employer] apartment called [facility]. She tells me she has RW and Engineer, site apartment. Sister who is 2 yrs younger lives 2 doors away and assists her if she needs help.  OT is recommending HHOT which will require HHPT. MD will need to place order for HHPT and HHOT along with Face to face.  Made Butch Penny, rep with [facility] aware of HHPT, HHOT need. MD added [facility] Aide for this pt.    Action/Plan: CM will sign off but will be available should additional needs arise.  Expected Discharge Date:                  Expected Discharge Plan:  Home/Self Care  In-House Referral:     Discharge planning Services  CM Consult  Post Acute Care Choice:    Choice offered to:  Patient  DME Arranged:   (Has RW and Civil engineer, contracting) DME Agency:     HH Arranged:    [place] Agency:     Status of Service:  In process, will continue to follow  If discussed at Long Length of Stay Meetings, dates discussed:    Additional Comments:  Delrae Sawyers, RN 12/28/2015, 10:55 AM

## 2015-12-28 NOTE — Progress Notes (Signed)
Spoke with Dr. Benjamine Mola. Will requests consult if needed.   Jane [name], [place]

## 2015-12-28 NOTE — Discharge Instructions (Signed)
Ms. Dall it was a pleasure to meet you and I am happy to say that we found no problems with your heart and lungs during this hospital workup.  Your shortness of breath with exertion was most likely due to deconditioning and some muscle weakness causing you to take more effort than normal for moving around and performing activities. You should try your best participating with therapy recommendations at home to try and rebuild some strength.  You should also try to use your walker as much as possible while navigating your apartment, as this is safer and can require less energy than leaning against surfaces.  I expect you will do very well, but lease call back to your primary clinic if your shortness of breath gets worse again, or the emergency department if you are unable to contact them.

## 2015-12-28 NOTE — Progress Notes (Signed)
Subjective: Patient is feeling comfortable this morning, without dyspnea at rest or any chest pain. She had some recurrence of her dyspnea while working with occupational therapy this morning. She states that she requires aide assistance for some of her ADLs at home as a baseline.  Objective: Vital signs in last 24 hours: Filed Vitals:   12/27/15 1415 12/27/15 2009 12/28/15 0522 12/28/15 1000  BP:  110/56 136/56 153/53  Pulse: 73 65 85   Temp: 98.2 F (36.8 C) 98.1 F (36.7 C) 98.3 F (36.8 C)   TempSrc: Oral Oral Oral   Resp:  17 15   Height:      Weight:   74.163 kg (163 lb 8 oz)   SpO2: 100% 100% 98%    Weight change: 0.136 kg (4.8 oz)  Intake/Output Summary (Last 24 hours) at 12/28/15 1117 Last data filed at 12/28/15 0856  Gross per 24 hour  Intake    240 ml  Output   1150 ml  Net   -910 ml   GENERAL- alert, co-operative, NAD HEENT- Atraumatic, oral mucosa appears moist CARDIAC- RRR, no murmurs RESP- CTAB, no wheezes or crackles ABDOMEN- Soft, nontender, no guarding or rebound EXTREMITIES- chronic venous stasis dermatitis of left lower extremity great on lateral aspect, no pedal edema PSYCH- Normal mood and affect, appropriate thought content and speech   Lab Results: Basic Metabolic Panel:  Recent Labs Lab 12/27/15 0643 12/28/15 0327  NA 142 141  K 3.8 4.0  CL 112* 114*  CO2 21* 21*  GLUCOSE 94 92  BUN 51* 41*  CREATININE 1.41* 1.30*  CALCIUM 9.2 8.7*   Liver Function Tests:  Recent Labs Lab 12/27/15 0643  AST 22  ALT 15  ALKPHOS 59  BILITOT 1.7*  PROT 7.4  ALBUMIN 4.7   CBC:  Recent Labs Lab 12/27/15 0643  WBC 3.4*  NEUTROABS 1.8  HGB 10.0*  HCT 29.5*  MCV 91.0  PLT 101*   Cardiac Enzymes:  Recent Labs Lab 12/27/15 1146 12/27/15 2000 12/27/15 2332  TROPONINI <0.03 0.03* <0.03   Anemia Panel:  Recent Labs Lab 12/28/15 0327  RETICCTPCT 0.9   Urinalysis:  Recent Labs Lab 12/27/15 1311  COLORURINE YELLOW  LABSPEC  1.016  PHURINE 5.5  GLUCOSEU NEGATIVE  HGBUR NEGATIVE  BILIRUBINUR NEGATIVE  KETONESUR NEGATIVE  PROTEINUR NEGATIVE  NITRITE NEGATIVE  LEUKOCYTESUR NEGATIVE    Micro Results: Recent Results (from the past 240 hour(s))  Rapid strep screen (not at [facility])     Status: None   Collection Time: 12/27/15  7:30 AM  Result Value Ref Range Status   Streptococcus, Group A Screen (Direct) NEGATIVE NEGATIVE Final    Comment: (NOTE) A Rapid Antigen test may result negative if the antigen level in the sample is below the detection level of this test. The FDA has not cleared this test as a stand-alone test therefore the rapid antigen negative result has reflexed to a Group A Strep culture.   Culture, group A strep     Status: None (Preliminary result)   Collection Time: 12/27/15  7:30 AM  Result Value Ref Range Status   Specimen Description THROAT  Final   Special Requests NONE Reflexed from [phone]  Final   Culture   Final    CULTURE REINCUBATED FOR BETTER GROWTH Performed at [facility]    Report Status PENDING  Incomplete   Studies/Results: Dg Chest 2 View  12/27/2015  CLINICAL DATA:  Sore throat this morning. Worsening cough over the  past 2-3 days. EXAM: CHEST  2 VIEW COMPARISON:  08/09/2015 FINDINGS: Unchanged moderate cardiomegaly and hyperinflation no airspace consolidation. No effusions. Normal pulmonary vasculature. Unremarkable hilar and mediastinal contours. IMPRESSION: Cardiomegaly and hyperinflation.  No acute findings. Electronically Signed   By: Andreas Newport M.D.   On: 12/27/2015 06:25   Medications: I have reviewed the patient's current medications. Scheduled Meds: . aspirin EC  81 mg Oral Daily  . atorvastatin  40 mg Oral q1800  . bisoprolol  2.5 mg Oral Daily  . isosorbide mononitrate  30 mg Oral Daily  . losartan  50 mg Oral Daily  . multivitamin with minerals  1 tablet Oral Daily  . potassium chloride SA  20 mEq Oral Daily   Continuous Infusions:  PRN  Meds:.acetaminophen **OR** acetaminophen, albuterol, hydroxypropyl methylcellulose / hypromellose, senna-docusate Assessment/Plan: Dyspnea on exertion: Seems to be worsening of a chronic problem. She has known CAD with multiple risk factors but preserved EF on TTE from February which was her last hospital admission. Troponins negative and EKG unchanged during admission here. Previous PFTs checked in outpatient setting did not demonstrate COPD. She has maintained good oxygenation here but we need to challenge with pulse oximetry during ambulation. She will probably benefit from some continued home health services for deconditioning, will F/U PT recs. -Ambulate with pulse oximetry -PT evaluation -No findings warranting more extensive cardiac evaluation at this time  CAD, Hypertension: Continue home medications lipitor, bisoprolol, losartan, imdur. She is chronically on lasix 40mg  and KCl 107mEq.  GERD: Continue PPI  FULL CODE Diet: Heart healthy VTE ppx: [place] enoxaparin     Collier Salina, MD 12/28/2015, 11:17 AM

## 2015-12-28 NOTE — Care Management Obs Status (Signed)
[place] NOTIFICATION   Patient Details  Name: ELESHIA ZIERDEN MRN: [id number] Date of Birth: 06-03-1929   Medicare Observation Status Notification Given:  Yes    CrutchfieldAntony Haste, RN 12/28/2015, 10:58 AM

## 2015-12-28 NOTE — Progress Notes (Signed)
Occupational Therapy Evaluation Patient Details Name: Jane [name] MRN: [id number] DOB: [birth year] Today's Date: 12/28/2015    History of Present Illness Patient is a 82 y/o female with hx of CAD s/p NSTEMI, s/p DES stent in RCA, CHF, COPD, HTN, vitamin D deficiency, anemia and GERD presents with SOB. CXR-Cardiomegaly and hyperinflation.   Clinical Impression   PTA, pt reports was mostly independent with ADLs except for needing assistance getting in/out of tub and used RW for mobility. Pt currently requires min assist for LB ADLs and transfers due to generalized weakness, dyspnea, fatigue, and balance deficits. Pt plans to d/c home with intermittent assistance from her sister and neighbors. Pt will benefit from continued acute OT to increase independence and safety with ADLs and mobility to allow for safe discharge home. Recommend HHOT and [place] aide to assist with bathing and IADL completion.    Follow Up Recommendations  Home health OT;Supervision - Intermittent ([facility] aide)    Equipment Recommendations  None recommended by OT    Recommendations for Other Services       Precautions / Restrictions Precautions Precautions: Fall Restrictions Weight Bearing Restrictions: No      Mobility Bed Mobility               General bed mobility comments: Pt sitting on EOB on OT arrival  Transfers Overall transfer level: Needs assistance Equipment used: Rolling walker (2 wheeled) Transfers: Sit to/from [facility] Sit to Stand: Min assist Stand pivot transfers: Min guard       General transfer comment: Assist for boost to stand and VCs for hand placement.     Balance Overall balance assessment: Needs assistance Sitting-balance support: No upper extremity supported;Feet supported Sitting balance-Leahy Scale: Good     Standing balance support: Bilateral upper extremity supported;During functional activity Standing balance-Leahy Scale: Fair Standing balance  comment: able to maintain balance without UE support briefly during static standing tasks                            ADL Overall ADL's : Needs assistance/impaired     Grooming: Wash/dry hands;Wash/dry face;Min guard;Standing   Upper Body Bathing: Min guard;Sitting   Lower Body Bathing: Minimal assistance;Sit to/from stand Lower Body Bathing Details (indicate cue type and reason): unable to reach feet Upper Body Dressing : Min guard;Sitting   Lower Body Dressing: Minimal assistance;Sit to/from stand Lower Body Dressing Details (indicate cue type and reason): unable to reach feet Toilet Transfer: Min guard;Cueing for safety;Ambulation;BSC;RW   Toileting- Water quality scientist and Hygiene: Min guard;Sit to/from stand       Functional mobility during ADLs: Min guard;Rolling walker       Vision Vision Assessment?: No apparent visual deficits   Perception     Praxis      Pertinent Vitals/Pain Pain Assessment: No/denies pain     Hand Dominance Right   Extremity/Trunk Assessment Upper Extremity Assessment Upper Extremity Assessment: LUE deficits/detail;RUE deficits/detail RUE Deficits / Details: overall 4-/5 strength; AROM: shoulder flexion 120 degrees, others wfl LUE Deficits / Details: overall 3+/5 strength; AROM: shoulder flexion 120 degrees, others wfl, Boutonniere deformity of thumb    Lower Extremity Assessment Lower Extremity Assessment: Defer to PT evaluation   Cervical / Trunk Assessment Cervical / Trunk Assessment: Kyphotic   Communication Communication Communication: No difficulties   Cognition Arousal/Alertness: Awake/alert Behavior During Therapy: WFL for tasks assessed/performed Overall Cognitive Status: Within Functional Limits for tasks assessed  General Comments       Exercises       Shoulder Instructions      Home Living Family/patient expects to be discharged to:: Private residence Living  Arrangements: Alone Available Help at Discharge: Family;Available PRN/intermittently Type of Home: Apartment [facility]) Home Access: Elevator     Home Layout: One level     Bathroom Shower/Tub: Teacher, early years/pre: Standard     Home Equipment: Environmental consultant - 2 wheels;Tub bench;Grab bars - tub/shower;Grab bars - toilet;Other (comment) (call buttons to alert staff for assistance)          Prior Functioning/Environment Level of Independence: Needs assistance  Gait / Transfers Assistance Needed: Furniture walks in apartment, uses RW for community mobility ADL's / Homemaking Assistance Needed: Pt reports she is independent with sponge bathing and dressing, but has difficulty reaching feet. Facility provides 3 meals/day and brings them to the room and her nephew drives her to appointments.        OT Diagnosis: Generalized weakness   OT Problem List: Decreased strength;Decreased range of motion;Impaired balance (sitting and/or standing);Decreased activity tolerance;Decreased safety awareness;Decreased knowledge of use of DME or AE;Obesity;Pain   OT Treatment/Interventions: Self-care/ADL training;Therapeutic exercise;Energy conservation;DME and/or AE instruction;Therapeutic activities;Patient/family education;Balance training    OT Goals(Current goals can be found in the care plan section) Acute Rehab OT Goals Patient Stated Goal: "to keep doing for myself as long as I can" OT Goal Formulation: With patient Time For Goal Achievement: 01/11/16 Potential to Achieve Goals: Good ADL Goals Pt Will Perform Grooming: with modified independence;standing Pt Will Perform Lower Body Bathing: with modified independence;with adaptive equipment;sit to/from stand Pt Will Perform Lower Body Dressing: with modified independence;sit to/from stand;with adaptive equipment Pt Will Transfer to Toilet: with modified independence;ambulating;bedside commode (over toilet) Pt Will Perform  Toileting - Clothing Manipulation and hygiene: with modified independence;sit to/from stand;sitting/lateral leans  OT Frequency: Min 2X/week   Barriers to D/C: Decreased caregiver support  Lives alone and often relies on elderly neighbors       Co-evaluation              End of Session Equipment Utilized During Treatment: Gait belt;Rolling walker Nurse Communication: Mobility status  Activity Tolerance: Patient tolerated treatment well Patient left: in bed;with call bell/phone within reach;Other (comment) (MD present)   Time: [id number] OT Time Calculation (min): 22 min Charges:  OT General Charges $OT Visit: 1 Procedure OT Evaluation $OT Eval Moderate Complexity: 1 Procedure G-Codes: OT G-codes **NOT FOR INPATIENT CLASS** Functional Assessment Tool Used: clinical judgement Functional Limitation: Self care Self Care Current Status [id number]): At least 1 percent but less than 20 percent impaired, limited or restricted Self Care Goal Status [id number]): At least 1 percent but less than 20 percent impaired, limited or restricted  Redmond Baseman, OTR/L Pager: [phone]  12/28/2015, 9:50 AM

## 2015-12-28 NOTE — Progress Notes (Signed)
Patient clear bilaterally will hold on PRN breathing treatment at this time. Patient in no distress.

## 2015-12-28 NOTE — Discharge Summary (Signed)
Name: Jane [name] MRN: [id number] DOB: March 20, 1929 82 y.o. PCP: Milagros Loll, MD  Date of Admission: 12/27/2015  4:43 AM Date of Discharge: 12/28/2015 Attending Physician: Axel Filler, MD  Discharge Diagnosis: Principal Problem:   Dyspnea on exertion Active Problems:   Hyperlipidemia   Essential hypertension   GERD   Status post insertion of drug-eluting stent into right coronary artery for coronary artery disease   CKD (chronic kidney disease) stage 3, GFR 30-59 ml/min   Pancytopenia ([place])   Discharge Medications:   Medication List    STOP taking these medications        cyanocobalamin 1000 MCG/ML injection  Commonly known as:  (VITAMIN B-12)      TAKE these medications        acetaminophen 325 MG tablet  Commonly known as:  TYLENOL  Take 650 mg by mouth every 6 (six) hours as needed for mild pain.     albuterol 108 (90 Base) MCG/ACT inhaler  Commonly known as:  PROVENTIL HFA;VENTOLIN HFA  Inhale 2 puffs into the lungs every 4 (four) hours as needed for wheezing or shortness of breath.     aspirin EC 81 MG tablet  Take 81 mg by mouth daily.     atorvastatin 40 MG tablet  Commonly known as:  LIPITOR  Take 1 tablet (40 mg total) by mouth daily at 6 PM.     bisoprolol 5 MG tablet  Commonly known as:  ZEBETA  take 1/2 tablet by mouth once daily     furosemide 40 MG tablet  Commonly known as:  LASIX  Take 1 tablet (40 mg total) by mouth daily.     hydroxypropyl methylcellulose / hypromellose 2.5 % ophthalmic solution  Commonly known as:  ISOPTO TEARS / GONIOVISC  Place 1 drop into the left eye 4 (four) times daily as needed for dry eyes.     isosorbide mononitrate 30 MG 24 hr tablet  Commonly known as:  IMDUR  Take 1 tablet (30 mg total) by mouth daily.     losartan 50 MG tablet  Commonly known as:  COZAAR  take 1 tablet by mouth once daily     potassium chloride SA 20 MEQ tablet  Commonly known as:  K-DUR,KLOR-CON  take 1 tablet by  mouth once daily     senna-docusate 8.6-50 MG tablet  Commonly known as:  Senokot-S  Take 1 tablet by mouth at bedtime as needed for mild constipation.        Disposition and follow-up:   [name] was discharged from [facility] in Good condition.  At the hospital follow up visit please address:  Deconditioning: No new evidence of cardiac or pulmonary disease underlying Ms. Kendrix's exacerbation of weakness and dyspnea with exertion. She may have also been suffering a viral pharyngitis worsening her condition. Therapy noted substantial core strength deficits consistent with her story of leaning on surfaces to navigate her home environment. Home health therapy recommended at discharge.  Pancytopenia: Mild depression of all cell line counts on CBC this admission. She was previously evaluated for iron and B12 deficiency in 03/2015 which were negative.   Follow-up Appointments: Follow-up Information    Follow up with Jacques Earthly, MD. Go on 01/11/2016.   Specialty:  Internal Medicine   Why:  @ 3:45pm   Contact information:   [place] [address] [phone]       Follow up with [facility].  Why:  PT, OT aide   Contact information:   [address] [place] [address] [phone]       Hospital Course by problem list:  Dyspnea on exertion: 82 y/o woman admitted with dyspnea on exertion provoked by hurrying around her apartment. There were no worrying associated symptoms such as nausea or diaphoresis. She was previously seen for similar symptoms earlier this year and brilinta was discontinued at that time as a possible contributor. Cardiac causes were ruled out with trending serial negative troponins, a normal BNP of 84, EKG and cardiac telemetry remaining in normal sinus rhythm for >24 hours. She never had associated chest pain. CXR obtained showing hyperinflation without infiltrative changes and she had no  respiratory symptoms. She as ambulated on pulse oximetry monitoring that was 97% on room air. Physical and occupation therapy evaluations were obtained that did observe dyspnea no exertion and reliance on assistive devices at least in part attributable to severe core muscle strength deficits.  Pancytopenia: Admitted with Hgb 10.0, WBC 3.4, Plts 101. On review, previous iron studies and B12 negative for deficiency in 03/2015.  Discharge Vitals:   BP 153/53 mmHg  Pulse 85  Temp(Src) 98.3 F (36.8 C) (Oral)  Resp 15  Ht 5\' 4"  (1.626 m)  Wt 163 lb 8 oz (74.163 kg)  BMI 28.05 kg/m2  SpO2 98%  Pertinent Labs, Studies, and Procedures:  GAS throat swab- negative  Discharge Instructions: Discharge Instructions    Face-to-face encounter (required for Medicare/Medicaid patients)    Complete by:  As directed   I Hinton Lovely certify that this patient is under my care and that I, or a nurse practitioner or physician's assistant working with me, had a face-to-face encounter that meets the physician face-to-face encounter requirements with this patient on 12/28/2015. The encounter with the patient was in whole, or in part for the following medical condition(s) which is the primary reason for home health care (List medical condition): Dyspnea on exertion, decontitioning  The encounter with the patient was in whole, or in part, for the following medical condition, which is the primary reason for home health care:  Dyspnea on exertion  I certify that, based on my findings, the following services are medically necessary home health services:  Physical therapy  Reason for Medically Necessary Home Health Services:  Therapy- Therapeutic Exercises to Increase Strength and Endurance  My clinical findings support the need for the above services:   Unable to leave home safely without assistance and/or assistive device Shortness of breath with activity    Further, I certify that my clinical findings support that this  patient is homebound due to:  Shortness of Breath with activity     Home Health    Complete by:  As directed   To provide the following care/treatments:   PT OT Home Health Aide             Signed: Collier Salina, MD 12/31/2015, 11:44 PM

## 2015-12-28 NOTE — Progress Notes (Signed)
  Patient Saturations on Room Air at Rest = 99 %  Patient Saturations on Room Air while Ambulating =  97 %  Jane [name], [place]

## 2015-12-29 ENCOUNTER — Other Ambulatory Visit: Payer: Self-pay

## 2015-12-29 DIAGNOSIS — R0600 Dyspnea, unspecified: Secondary | ICD-10-CM

## 2015-12-29 LAB — CULTURE, GROUP A STREP (THRC)

## 2015-12-29 NOTE — Patient Outreach (Signed)
[name]) Care Management [place] Telephone Outreach, Transition of Care Call # 1  12/29/2015  KWAN BERLAND 1929-03-31 [id number]  Received referral from [name] liaison for transition of care. Patient was discharged on 12/28/2015 following an admission for dyspnea with minimal exertion. Patient also has a past medical history of: anemia, GERD, hyperlipidemia, hypertension, stroke, heart failure, coronary artery disease.  Initial outreach call to patient at her home, [phone], spoke with patient. RNCM introduced self and discussed confidentiality. Verified patient identification via DOB and address. Educated patient regarding purpose of call and patient agreeable to services with [place] Management.   Patient states that she has been feeling better since her discharge home yesterday. Stated that she normally gets short of breath when she gets in a hurry, so she is trying to just "take things slowly." Stated that is only a challenge when she waits too long to go to the bathroom and gets in a hurry to get there. Patient reports that she is on a fluid pill that makes her go to the bathroom and she wears depends, but feels that sometimes they are too thin.   Patient states that she does not have any questions regarding her discharge instructions. Stated that she was given a new prescription, but when she called the pharmacy it was only for a stool softener. Patient stated that she does not have any trouble with constipation and has not picked up this prescription. Patient reports that she has had normal bowel movements today that were soft. Stated she is taking all other medications as prescribed.   Patient reported that she does have scales and weighs daily, but does not record readings and does not weigh at the same time every day. Educated regarding importance of weighing at approximately the same time and she responded with "my doctor just said I had to weigh once  a day." Patient also reported that her weights remain the same. Reports weights fluctuate between 156 and 160 pounds.   Patient reported that she has an appointment scheduled with the Outpatient clinic on 01/11/2016. Stated that her PCP has instructed her to call 911 if she experiences worsening of shortness of breath. Patient stated that she has transportation to her appointments. Stated that her nephew would be taking her to the appointment on the 13th.   Patient agreeable to home visit, but reluctant to schedule. Requested that [name] call before coming to make sure it is still ok to come out. Advised RNCM will call prior to appointment.   Plan: Home visit scheduled for next week - will call prior to going out. Provide heart failure and safety education.  [place] R. Criag Wicklund, RN, BSN, [place] Management Coordinator [phone]

## 2016-01-03 ENCOUNTER — Other Ambulatory Visit: Payer: Self-pay | Admitting: Pulmonary Disease

## 2016-01-04 ENCOUNTER — Other Ambulatory Visit: Payer: Self-pay

## 2016-01-04 DIAGNOSIS — K219 Gastro-esophageal reflux disease without esophagitis: Secondary | ICD-10-CM | POA: Diagnosis not present

## 2016-01-04 DIAGNOSIS — D649 Anemia, unspecified: Secondary | ICD-10-CM | POA: Diagnosis not present

## 2016-01-04 DIAGNOSIS — I2510 Atherosclerotic heart disease of native coronary artery without angina pectoris: Secondary | ICD-10-CM | POA: Diagnosis not present

## 2016-01-04 DIAGNOSIS — Z87891 Personal history of nicotine dependence: Secondary | ICD-10-CM | POA: Diagnosis not present

## 2016-01-04 DIAGNOSIS — I129 Hypertensive chronic kidney disease with stage 1 through stage 4 chronic kidney disease, or unspecified chronic kidney disease: Secondary | ICD-10-CM | POA: Diagnosis not present

## 2016-01-04 DIAGNOSIS — Z955 Presence of coronary angioplasty implant and graft: Secondary | ICD-10-CM | POA: Diagnosis not present

## 2016-01-04 DIAGNOSIS — Z7982 Long term (current) use of aspirin: Secondary | ICD-10-CM | POA: Diagnosis not present

## 2016-01-04 DIAGNOSIS — N183 Chronic kidney disease, stage 3 (moderate): Secondary | ICD-10-CM | POA: Diagnosis not present

## 2016-01-04 DIAGNOSIS — E785 Hyperlipidemia, unspecified: Secondary | ICD-10-CM | POA: Diagnosis not present

## 2016-01-04 DIAGNOSIS — Z8673 Personal history of transient ischemic attack (TIA), and cerebral infarction without residual deficits: Secondary | ICD-10-CM | POA: Diagnosis not present

## 2016-01-04 DIAGNOSIS — M150 Primary generalized (osteo)arthritis: Secondary | ICD-10-CM | POA: Diagnosis not present

## 2016-01-11 ENCOUNTER — Encounter: Payer: Self-pay | Admitting: Pulmonary Disease

## 2016-01-12 ENCOUNTER — Other Ambulatory Visit: Payer: Self-pay

## 2016-01-12 DIAGNOSIS — K219 Gastro-esophageal reflux disease without esophagitis: Secondary | ICD-10-CM | POA: Diagnosis not present

## 2016-01-12 DIAGNOSIS — I2510 Atherosclerotic heart disease of native coronary artery without angina pectoris: Secondary | ICD-10-CM | POA: Diagnosis not present

## 2016-01-12 DIAGNOSIS — D649 Anemia, unspecified: Secondary | ICD-10-CM | POA: Diagnosis not present

## 2016-01-12 DIAGNOSIS — M150 Primary generalized (osteo)arthritis: Secondary | ICD-10-CM | POA: Diagnosis not present

## 2016-01-12 DIAGNOSIS — N183 Chronic kidney disease, stage 3 (moderate): Secondary | ICD-10-CM | POA: Diagnosis not present

## 2016-01-12 DIAGNOSIS — I129 Hypertensive chronic kidney disease with stage 1 through stage 4 chronic kidney disease, or unspecified chronic kidney disease: Secondary | ICD-10-CM | POA: Diagnosis not present

## 2016-01-12 NOTE — Patient Outreach (Signed)
Unsuccessful attempt made to contact patient via telephone for community care coordination.  Plan; Assigned RNCM to follow up next week

## 2016-01-16 ENCOUNTER — Telehealth: Payer: Self-pay | Admitting: Pulmonary Disease

## 2016-01-16 ENCOUNTER — Ambulatory Visit: Payer: Self-pay

## 2016-01-16 ENCOUNTER — Ambulatory Visit: Payer: Self-pay | Admitting: *Deleted

## 2016-01-16 DIAGNOSIS — M150 Primary generalized (osteo)arthritis: Secondary | ICD-10-CM | POA: Diagnosis not present

## 2016-01-16 DIAGNOSIS — I2510 Atherosclerotic heart disease of native coronary artery without angina pectoris: Secondary | ICD-10-CM | POA: Diagnosis not present

## 2016-01-16 DIAGNOSIS — K219 Gastro-esophageal reflux disease without esophagitis: Secondary | ICD-10-CM | POA: Diagnosis not present

## 2016-01-16 DIAGNOSIS — N183 Chronic kidney disease, stage 3 (moderate): Secondary | ICD-10-CM | POA: Diagnosis not present

## 2016-01-16 DIAGNOSIS — D649 Anemia, unspecified: Secondary | ICD-10-CM | POA: Diagnosis not present

## 2016-01-16 DIAGNOSIS — I129 Hypertensive chronic kidney disease with stage 1 through stage 4 chronic kidney disease, or unspecified chronic kidney disease: Secondary | ICD-10-CM | POA: Diagnosis not present

## 2016-01-16 NOTE — Patient Outreach (Signed)
[place] [facility]) Care Management    Jane [name] [birth year] [id number]  Late entry for 01/04/2016  Initial home visit completed.  Subjective: Patient stated that she is feeling ok today.  Currently denies any shortness of breath or difficulty breathing. Denies any pain. Stated that she gets short of breath when she moves around, so she takes things slowly. Denies any swelling in her hands, feet, ankles, or abdomen.   Objective: BP 146/68 Pulse 76  SpO2 97% on room air, Respirations 18, even and unlabored, no distress noted. Patient demonstrated weighing herself today for RNCM. Weight today 162 lbs. Patient has a rolling walker, but does not use when ambulating around apartment. Does not use walker to balance on scales. Patient uses furniture/walls when unsteady when ambulating around room.   Assessment: Patient able to verbalize what to do when she is experiencing signs and symptoms of heart failure. Although she cannot state the specific heart failure zones, patient stated that she has the magnet on her fridge that shows her the zones. Patient is repetitive with much of her information including that she does not have to record her weights because they are always the same, and that she gets short of breath when she gets in a hurry. Patient does not have any throw rugs or any significant items in path in living area and kitchen, but she did have some towels/newspaper on the floor at entryway covering a grease spill. She is waiting for maintenance to come and repair her carpet. Patient stated that her providers have instructed that if she becomes short of breath more than normal that she call 911. She is adamant that her plan is to call 911.  Patient stated that she does not have any transportation needs. Stated that her nephew can take her to her appointments. Stated that she will also use a cab service if necessary, but declines using [facility] due to having to wait to be picked up following  an appointment.   Plan: Provided education to patient regarding COPD and heart failure. Instructed how to perform daily weights and to report weight gain of 3 lbs in one day or 5 lbs in one week to her provider. Encouraged to use walker for safety while weighing. Reviewed heart failure zones with patient and patient able to verbalize that she is currently in green zone with no symptoms at present. Will continue to monitor for heart failure and provide education. Will continue to address safety concerns due to history of falls.   Educated patient regarding 24 hour nurse line and patient verbalized understanding.   Patient agreeable to home visit again in 2 weeks, but requested that nurse call prior to appointment.  [place] R. Ashden Sonnenberg, RN, BSN, [place] Management Coordinator [phone]

## 2016-01-16 NOTE — Telephone Encounter (Signed)
PT HH, states pt fell 7/17, tripped over stool and landed on her back to a carpeted floor, pt states she was not hurt, he has spoken to her about safety, walker use, and has moved the stool, pt refuses med eval

## 2016-01-16 NOTE — Telephone Encounter (Signed)
PT from [facility] calling to report a fall on 01/15/2016 and would like a call back.

## 2016-01-17 ENCOUNTER — Other Ambulatory Visit: Payer: Self-pay | Admitting: Pulmonary Disease

## 2016-01-17 DIAGNOSIS — I129 Hypertensive chronic kidney disease with stage 1 through stage 4 chronic kidney disease, or unspecified chronic kidney disease: Secondary | ICD-10-CM | POA: Diagnosis not present

## 2016-01-17 DIAGNOSIS — D649 Anemia, unspecified: Secondary | ICD-10-CM | POA: Diagnosis not present

## 2016-01-17 DIAGNOSIS — I2510 Atherosclerotic heart disease of native coronary artery without angina pectoris: Secondary | ICD-10-CM | POA: Diagnosis not present

## 2016-01-17 DIAGNOSIS — M150 Primary generalized (osteo)arthritis: Secondary | ICD-10-CM | POA: Diagnosis not present

## 2016-01-17 DIAGNOSIS — K219 Gastro-esophageal reflux disease without esophagitis: Secondary | ICD-10-CM | POA: Diagnosis not present

## 2016-01-17 DIAGNOSIS — N183 Chronic kidney disease, stage 3 (moderate): Secondary | ICD-10-CM | POA: Diagnosis not present

## 2016-01-18 ENCOUNTER — Other Ambulatory Visit: Payer: Self-pay

## 2016-01-18 NOTE — Telephone Encounter (Signed)
Requesting potassium med to be filled.

## 2016-01-18 NOTE — Telephone Encounter (Signed)
Noted, thanks. She's due for follow up.

## 2016-01-19 ENCOUNTER — Telehealth: Payer: Self-pay

## 2016-01-19 MED ORDER — POTASSIUM CHLORIDE CRYS ER 20 MEQ PO TBCR: 20.0000 meq | EXTENDED_RELEASE_TABLET | Freq: Every day | ORAL | Status: DC

## 2016-01-19 NOTE — Patient Outreach (Signed)
[facility]) Care Management  01/18/2016  Jane [name] [birth year] [id number]   Home visit completed with patient. Subjective: Patient denies pain or shortness of breath. Stated that she has been feeling ok, she just takes her time so that she does not get short of breath. Denies any significant changes in past 2 weeks.  Patient reported that her physical therapist, Shanon Brow came out on Tuesday, but she declined PT today. Stated that he likes to make her walk in the hall and she did not feel like walking today. Stated that she was not sure if he would be returning because his visits for next week were marked through on her calendar.  Patient does report some irritation under her right breast due to not wearing a bra and her depends rubbing when she pulls them all the way up. Patient stated she has been self-treating and it is better. Patient reported that she has fallen once since [facility] last visit. Stated that one of her neighbors had come over to replace her phone cord and she tripped over one that was laying near her doorway. Stated that she fell onto her right side and had a small skin tear that was bleeding on her right elbow. Stated that her neighbor covered it with a band-aid and it is now better also.  Patient reports that she continues to weight daily with weight remaining "about the same." Reported weight today was 161 lbs. Denies any swelling in hands, feet, ankles, abdomen.  Patient reported that she has a follow up appointment scheduled on 02/01/2016 with outpatient clinic (Dr. Randell Patient). Patient reports that she does have transportation arranged for this appointment.  Objective: Patient is alert and oriented today. No signs or symptoms of distress noted.  Per record review, home health physical therapist reported fall to PCP. Patient has a small closed skin tear to right elbow with no redness, swelling or drainage. No other bruises noted to right arm/shoulder.  Patient is  ambulatory around apartment, but does not use walker. Patient does use furniture or walls for stability when ambulating.  Assessment: Discussed follow up appointments with patient due to history of missing appointments. Patient stated that she thinks she has transportation arranged, but when the appointment comes up, she does not have transportation. Stated that she could get a cab to take her to appointments. Patient refuses to use [facility] due to having to wait for then to pick her up. Patient has reported that her nephew helps her get to her appointments, but that he is less available during the summer months. Patient declines need for transportation assistance with Dr. Randell Patient on 8/13. Patient unable to recall heart failure zones, but reports that she has them on her fridge and reads it every day. Patient able to verbalize she is in the green zone today with no signs or symptoms. Educated the importance of learning to call her doctor if she notices any of the symptoms listed in the yellow zone and patient verbalized understanding.  Patient is doing well, taking her medications as prescribed and remains symptom free at present.  Patient encouraged to use walker at all times when ambulating. Patient verbalizes understanding but states that she does not need it in the home. Floors in living room and kitchen are free from clutter with one rug at the main entrance. This is a throw rug that her neighbor bought her to cover the grease stain on the floor until maintenance comes to fix her floor. Patient reports that  maintenance has told her she may get new carpet throughout her apartment, but they are waiting on approval before making any changes.  Plan: Continue to provide safety education Continue to monitor for heart failure and provide education.  Will follow up with home health physical therapy Shanon Brow [id number]) regarding visits next week.  [name], RN, BSN, [place] Management  Coordinator [phone]

## 2016-01-19 NOTE — Patient Outreach (Signed)
[facility]) Care Management  01/19/2016 Jane [name] [birth year] [id number]  Successful outreach to patient's home health physical therapist, Shanon Brow at [phone] for coordination of care. Advised calling to verify if patient will have PT visits next week due to those being marked off her calendar. Shanon Brow reported that there will be a PT covering for him next week that plans to see the patient. However, continued visits will be dependent on patient's participation. Shanon Brow reports challenges with patient being agreeable to having them come out. Stated that she has canceled several times. He reports that she would benefit from strengthening and using walker in the home.   Plan: RNCM to continue to encourage patient participation and education regarding home safety and mobility using walker in the home to support PT efforts.  [name], RN, BSN, [place] Management Coordinator [phone]

## 2016-01-23 DIAGNOSIS — M150 Primary generalized (osteo)arthritis: Secondary | ICD-10-CM | POA: Diagnosis not present

## 2016-01-23 DIAGNOSIS — I2510 Atherosclerotic heart disease of native coronary artery without angina pectoris: Secondary | ICD-10-CM | POA: Diagnosis not present

## 2016-01-23 DIAGNOSIS — K219 Gastro-esophageal reflux disease without esophagitis: Secondary | ICD-10-CM | POA: Diagnosis not present

## 2016-01-23 DIAGNOSIS — D649 Anemia, unspecified: Secondary | ICD-10-CM | POA: Diagnosis not present

## 2016-01-23 DIAGNOSIS — I129 Hypertensive chronic kidney disease with stage 1 through stage 4 chronic kidney disease, or unspecified chronic kidney disease: Secondary | ICD-10-CM | POA: Diagnosis not present

## 2016-01-23 DIAGNOSIS — N183 Chronic kidney disease, stage 3 (moderate): Secondary | ICD-10-CM | POA: Diagnosis not present

## 2016-01-25 DIAGNOSIS — I2510 Atherosclerotic heart disease of native coronary artery without angina pectoris: Secondary | ICD-10-CM | POA: Diagnosis not present

## 2016-01-25 DIAGNOSIS — M150 Primary generalized (osteo)arthritis: Secondary | ICD-10-CM | POA: Diagnosis not present

## 2016-01-25 DIAGNOSIS — I129 Hypertensive chronic kidney disease with stage 1 through stage 4 chronic kidney disease, or unspecified chronic kidney disease: Secondary | ICD-10-CM | POA: Diagnosis not present

## 2016-01-25 DIAGNOSIS — N183 Chronic kidney disease, stage 3 (moderate): Secondary | ICD-10-CM | POA: Diagnosis not present

## 2016-01-25 DIAGNOSIS — D649 Anemia, unspecified: Secondary | ICD-10-CM | POA: Diagnosis not present

## 2016-01-25 DIAGNOSIS — K219 Gastro-esophageal reflux disease without esophagitis: Secondary | ICD-10-CM | POA: Diagnosis not present

## 2016-01-26 ENCOUNTER — Other Ambulatory Visit: Payer: Self-pay

## 2016-01-26 NOTE — Patient Outreach (Signed)
[facility] [facility]) Care Management  01/26/16  Jane [name] 02/04/1929 [id number]  Successful outreach completed with patient. Educated patient about confidentiality and patient identification verified.   Patient is alert and oriented, answers questions appropriately.  Patient reports she is doing ok today. Currently denies any pain or shortness of breath. Patient stated that her redness under her right breast has completely gone away. Patient confirms that she did have a therapist come out yesterday. Stated that she was given some new exercises to do at home and is tolerating them.   Patient has a follow up appointment with the outpatient clinic next week on 02/01/2016. Patient stated that she has not talked to her nephew, but her sister told her that he was going to take her to her appointment. Encouraged patient to contact her nephew prior to the appointment to make sure he will be available for her appointment. Patient verbalized understanding and stated that she will try to make sure she gets to this appointment. Patient denies any changes to her medicines. No other complaints at present.  Plan: Continue to monitor for signs/symptoms and provide education. Follow up call next week prior to PCP appointment to make sure she has arranged transportation with her nephew.  [place] R. Chantee Cerino, RN, BSN, [place] Management Coordinator [phone]

## 2016-01-31 ENCOUNTER — Other Ambulatory Visit: Payer: Self-pay

## 2016-02-01 ENCOUNTER — Encounter: Payer: Self-pay | Admitting: Pulmonary Disease

## 2016-02-01 ENCOUNTER — Ambulatory Visit (INDEPENDENT_AMBULATORY_CARE_PROVIDER_SITE_OTHER): Payer: Medicare Other | Admitting: Pulmonary Disease

## 2016-02-01 VITALS — BP 131/50 | HR 64 | Temp 97.7°F | Ht 64.0 in | Wt 163.8 lb

## 2016-02-01 DIAGNOSIS — I5030 Unspecified diastolic (congestive) heart failure: Secondary | ICD-10-CM

## 2016-02-01 DIAGNOSIS — Z79899 Other long term (current) drug therapy: Secondary | ICD-10-CM | POA: Diagnosis not present

## 2016-02-01 DIAGNOSIS — I739 Peripheral vascular disease, unspecified: Secondary | ICD-10-CM

## 2016-02-01 DIAGNOSIS — I110 Hypertensive heart disease with heart failure: Secondary | ICD-10-CM

## 2016-02-01 DIAGNOSIS — I10 Essential (primary) hypertension: Secondary | ICD-10-CM | POA: Diagnosis not present

## 2016-02-01 DIAGNOSIS — I2510 Atherosclerotic heart disease of native coronary artery without angina pectoris: Secondary | ICD-10-CM | POA: Diagnosis not present

## 2016-02-01 DIAGNOSIS — D61818 Other pancytopenia: Secondary | ICD-10-CM | POA: Diagnosis not present

## 2016-02-01 DIAGNOSIS — Z7982 Long term (current) use of aspirin: Secondary | ICD-10-CM

## 2016-02-01 DIAGNOSIS — I5032 Chronic diastolic (congestive) heart failure: Secondary | ICD-10-CM | POA: Diagnosis not present

## 2016-02-01 DIAGNOSIS — Z955 Presence of coronary angioplasty implant and graft: Secondary | ICD-10-CM | POA: Diagnosis not present

## 2016-02-01 DIAGNOSIS — Z0000 Encounter for general adult medical examination without abnormal findings: Secondary | ICD-10-CM

## 2016-02-01 DIAGNOSIS — Z87891 Personal history of nicotine dependence: Secondary | ICD-10-CM | POA: Diagnosis not present

## 2016-02-01 DIAGNOSIS — Z23 Encounter for immunization: Secondary | ICD-10-CM

## 2016-02-01 NOTE — Assessment & Plan Note (Signed)
Assessment: She denies claudication. ABI 0.58 and 0.66 on R and L, respectively. No tobacco use.  Plan: Continue risk factor modification - ASA daily, statin, antihypertensives.

## 2016-02-01 NOTE — Assessment & Plan Note (Addendum)
Assessment: BP 131/50  Plan:  -Continue bisoprolol 2.5mg  daily, Lasix 40mg  daily, losartan 50mg  daily -Recheck BMP today, cr stable

## 2016-02-01 NOTE — Patient Instructions (Addendum)
Please make an appointment to follow up with your heart doctor. Follow up in 6 months or sooner as needed.

## 2016-02-01 NOTE — Patient Outreach (Signed)
[facility]) Care Management 01/31/2016  Successful telephone contact made with patient. Patient identify verified.   Patient stated that she is doing ok today. Currently has no complaints. Denies any pain or shortness of breath. Stated she just takes her time so she does not get short of breath. Patient denies any additional falls. Patient continues to weigh herself daily and denies any weight gain.  Patient stated that she has had no changes to her medications. She has an appointment with her PCP tomorrow. Patient stated that she does have transportation and will make sure that she goes. Patient stated that she talked with her nephew this afternoon to confirm he will be taking her.   RNCM to follow up with patient next week. RNCM contact information provided and patient encouraged to call with any questions or concerns.   [place] R. Rachelanne Whidby, RN, BSN, [place] Management Coordinator [phone]

## 2016-02-01 NOTE — Progress Notes (Signed)
   CC: left buttock pain  HPI:  [name] is a 82 year old woman with history of anemia, GERD, hyperlipidemia, stroke, CAD, chronic diastolic CHF, COPD, CKD stage III, hypertension here for evaluation of buttock pain.  She is followed by [facility] care management. Fell on 7/17 when she tripped over stool and landed on her back on carpeted floor. Patient declined medical evaluation.  Intermittent buttock pain for the past couple of days. Occasionally has radiation down leg. Worse with lying down. Improves with sitting up. Acetaminophen helps the pain. Still ambulating around house. Prepares her own food. Her sister lives down the hall from her.  Her dyspnea has been improving. Only occurs when she is in a hurry. Last used her albuterol yesterday - says she didn't really have to. Uses her albuterol 2-3 times a week.    Past Medical History:  Diagnosis Date  . Anemia   . Aortic insufficiency    mild  . Arthritis    "arms" (08/09/2015)  . CAD (coronary artery disease)    cath 08/09/2014 95% stenosis in prox to mid RCA s/p DES, 80-90% prox OM2, 50% distal LAD  . CHF (congestive heart failure) ([place])   . CKD (chronic kidney disease) stage 3, GFR 30-59 ml/min   . Colon polyp    2009 colonoscopy, not retrieved for pathology  . Ectropion of left lower eyelid   . GERD (gastroesophageal reflux disease) 2009   EGD with benign gastric polyp too  . Hyperlipidemia   . Hypertension   . Stroke ([place]) 1975  . Vitamin D deficiency     Review of Systems:   Constitutional: no fevers/chills Eyes: no vision changes Cardiovascular: no chest pain Gastrointestinal: no nausea/vomiting, no abdominal pain, no diarrhea Genitourinary: no dysuria, no hematuria  Physical Exam:  Vitals:   02/01/16 1420  BP: (!) 131/50  Pulse: 64  Temp: 97.7 F (36.5 C)  TempSrc: Oral  SpO2: 99%  Weight: 74.3 kg (163 lb 12.8 oz)  Height: 5\' 4"  (1.626 m)   General Apperance: NAD HEENT: Normocephalic, atraumatic,  anicteric sclera Neck: Supple, trachea midline Lungs: Clear to auscultation bilaterally. No wheezes, rhonchi or rales. Breathing comfortably Heart: Regular rate and rhythm Abdomen: Soft, nontender, nondistended, no rebound/guarding Extremities: Warm and well perfused, no edema Skin: No rashes Neurologic: Alert and interactive. No gross deficits.   Assessment & Plan:   See Encounters Tab for problem based charting.  Patient discussed with Dr. Beryle Beams

## 2016-02-02 LAB — BMP8+ANION GAP
Anion Gap: 19 mmol/L — ABNORMAL HIGH (ref 10.0–18.0)
BUN / CREAT RATIO: 25 (ref 12–28)
BUN: 34 mg/dL — AB (ref 8–27)
CO2: 19 mmol/L (ref 18–29)
CREATININE: 1.37 mg/dL — AB (ref 0.57–1.00)
Calcium: 9.7 mg/dL (ref 8.7–10.3)
Chloride: 106 mmol/L (ref 96–106)
GFR calc Af Amer: 41 mL/min/{1.73_m2} — ABNORMAL LOW (ref >59)
GFR calc non Af Amer: 35 mL/min/{1.73_m2} — ABNORMAL LOW (ref >59)
Glucose: 80 mg/dL (ref 65–99)
Potassium: 4.5 mmol/L (ref 3.5–5.2)
Sodium: 144 mmol/L (ref 134–144)

## 2016-02-02 LAB — CBC WITH DIFFERENTIAL/PLATELET
BASOS: 0 %
Basophils Absolute: 0 10*3/uL (ref 0.0–0.2)
EOS (ABSOLUTE): 0 10*3/uL (ref 0.0–0.4)
EOS: 0 %
HEMATOCRIT: 29.8 % — AB (ref 34.0–46.6)
Hemoglobin: 10.1 g/dL — ABNORMAL LOW (ref 11.1–15.9)
IMMATURE GRANS (ABS): 0 10*3/uL (ref 0.0–0.1)
IMMATURE GRANULOCYTES: 0 %
Lymphocytes Absolute: 1.8 10*3/uL (ref 0.7–3.1)
Lymphs: 47 %
MCH: 31.5 pg (ref 26.6–33.0)
MCHC: 33.9 g/dL (ref 31.5–35.7)
MCV: 93 fL (ref 79–97)
MONOS ABS: 0.4 10*3/uL (ref 0.1–0.9)
Monocytes: 10 %
NEUTROS ABS: 1.6 10*3/uL (ref 1.4–7.0)
Neutrophils: 43 %
Platelets: 119 10*3/uL — ABNORMAL LOW (ref 150–379)
RBC: 3.21 x10E6/uL — ABNORMAL LOW (ref 3.77–5.28)
RDW: 14.3 % (ref 12.3–15.4)
WBC: 3.8 10*3/uL (ref 3.4–10.8)

## 2016-02-02 NOTE — Assessment & Plan Note (Signed)
Assessment: Leukopenia resolved, anemia stable, and thrombocytopenia improved.  Plan: Continue to monitor with CBC

## 2016-02-02 NOTE — Assessment & Plan Note (Signed)
Tdap administered.

## 2016-02-02 NOTE — Assessment & Plan Note (Signed)
Asked patient to make follow up appointment with cardiology as she is due.

## 2016-02-02 NOTE — Assessment & Plan Note (Signed)
Assessment: Dyspnea on exertion stable. Weight largely stable. No crackles or LE edema on exam  Plan: Continue beta blocker, furosemide losartan

## 2016-02-02 NOTE — Progress Notes (Signed)
Medicine attending: Medical history, presenting problems, physical findings, and medications, reviewed with resident physician Dr Jennifer Krall on the day of the patient visit and I concur with her evaluation and management plan. 

## 2016-02-04 ENCOUNTER — Encounter (HOSPITAL_COMMUNITY): Payer: Self-pay | Admitting: Emergency Medicine

## 2016-02-04 ENCOUNTER — Emergency Department (HOSPITAL_COMMUNITY)
Admission: EM | Admit: 2016-02-04 | Discharge: 2016-02-04 | Disposition: A | Discharge status: Home / Self Care | Payer: Medicare Other | Attending: Emergency Medicine | Admitting: Emergency Medicine

## 2016-02-04 ENCOUNTER — Other Ambulatory Visit: Payer: Self-pay

## 2016-02-04 ENCOUNTER — Emergency Department (HOSPITAL_COMMUNITY): Payer: Medicare Other

## 2016-02-04 DIAGNOSIS — R52 Pain, unspecified: Secondary | ICD-10-CM | POA: Diagnosis not present

## 2016-02-04 DIAGNOSIS — R0602 Shortness of breath: Secondary | ICD-10-CM | POA: Diagnosis not present

## 2016-02-04 DIAGNOSIS — R42 Dizziness and giddiness: Secondary | ICD-10-CM

## 2016-02-04 DIAGNOSIS — M5432 Sciatica, left side: Secondary | ICD-10-CM | POA: Diagnosis not present

## 2016-02-04 DIAGNOSIS — I130 Hypertensive heart and chronic kidney disease with heart failure and stage 1 through stage 4 chronic kidney disease, or unspecified chronic kidney disease: Secondary | ICD-10-CM | POA: Diagnosis not present

## 2016-02-04 DIAGNOSIS — M79605 Pain in left leg: Secondary | ICD-10-CM | POA: Diagnosis not present

## 2016-02-04 DIAGNOSIS — N183 Chronic kidney disease, stage 3 (moderate): Secondary | ICD-10-CM | POA: Diagnosis not present

## 2016-02-04 DIAGNOSIS — I2510 Atherosclerotic heart disease of native coronary artery without angina pectoris: Secondary | ICD-10-CM | POA: Diagnosis not present

## 2016-02-04 DIAGNOSIS — H9203 Otalgia, bilateral: Secondary | ICD-10-CM | POA: Diagnosis present

## 2016-02-04 DIAGNOSIS — Z8673 Personal history of transient ischemic attack (TIA), and cerebral infarction without residual deficits: Secondary | ICD-10-CM | POA: Insufficient documentation

## 2016-02-04 DIAGNOSIS — H6123 Impacted cerumen, bilateral: Secondary | ICD-10-CM | POA: Insufficient documentation

## 2016-02-04 DIAGNOSIS — Z7982 Long term (current) use of aspirin: Secondary | ICD-10-CM | POA: Diagnosis not present

## 2016-02-04 DIAGNOSIS — Z87891 Personal history of nicotine dependence: Secondary | ICD-10-CM | POA: Diagnosis not present

## 2016-02-04 DIAGNOSIS — I509 Heart failure, unspecified: Secondary | ICD-10-CM | POA: Diagnosis not present

## 2016-02-04 DIAGNOSIS — Z79899 Other long term (current) drug therapy: Secondary | ICD-10-CM | POA: Insufficient documentation

## 2016-02-04 DIAGNOSIS — I6789 Other cerebrovascular disease: Secondary | ICD-10-CM | POA: Diagnosis not present

## 2016-02-04 LAB — URINALYSIS, ROUTINE W REFLEX MICROSCOPIC
Bilirubin Urine: NEGATIVE
Glucose, UA: NEGATIVE mg/dL
Hgb urine dipstick: NEGATIVE
KETONES UR: NEGATIVE mg/dL
LEUKOCYTES UA: NEGATIVE
NITRITE: NEGATIVE
PROTEIN: NEGATIVE mg/dL
Specific Gravity, Urine: 1.015 (ref 1.005–1.030)
pH: 5.5 (ref 5.0–8.0)

## 2016-02-04 LAB — I-STAT TROPONIN, ED: TROPONIN I, POC: 0.02 ng/mL (ref 0.00–0.08)

## 2016-02-04 LAB — CBC WITH DIFFERENTIAL/PLATELET
BASOS ABS: 0 10*3/uL (ref 0.0–0.1)
BASOS PCT: 0 %
EOS PCT: 0 %
Eosinophils Absolute: 0 10*3/uL (ref 0.0–0.7)
HCT: 27.4 % — ABNORMAL LOW (ref 36.0–46.0)
Hemoglobin: 9 g/dL — ABNORMAL LOW (ref 12.0–15.0)
LYMPHS PCT: 48 %
Lymphs Abs: 1.8 10*3/uL (ref 0.7–4.0)
MCH: 31.4 pg (ref 26.0–34.0)
MCHC: 32.8 g/dL (ref 30.0–36.0)
MCV: 95.5 fL (ref 78.0–100.0)
Monocytes Absolute: 0.3 10*3/uL (ref 0.1–1.0)
Monocytes Relative: 9 %
Neutro Abs: 1.6 10*3/uL — ABNORMAL LOW (ref 1.7–7.7)
Neutrophils Relative %: 43 %
PLATELETS: 97 10*3/uL — AB (ref 150–400)
RBC: 2.87 MIL/uL — AB (ref 3.87–5.11)
RDW: 13.9 % (ref 11.5–15.5)
WBC: 3.7 10*3/uL — AB (ref 4.0–10.5)

## 2016-02-04 LAB — BASIC METABOLIC PANEL
Anion gap: 8 (ref 5–15)
BUN: 38 mg/dL — AB (ref 6–20)
CHLORIDE: 114 mmol/L — AB (ref 101–111)
CO2: 19 mmol/L — ABNORMAL LOW (ref 22–32)
Calcium: 8.8 mg/dL — ABNORMAL LOW (ref 8.9–10.3)
Creatinine, Ser: 1.27 mg/dL — ABNORMAL HIGH (ref 0.44–1.00)
GFR calc non Af Amer: 37 mL/min — ABNORMAL LOW (ref >60)
GFR, EST AFRICAN AMERICAN: 43 mL/min — AB (ref >60)
Glucose, Bld: 99 mg/dL (ref 65–99)
POTASSIUM: 4 mmol/L (ref 3.5–5.1)
SODIUM: 141 mmol/L (ref 135–145)

## 2016-02-04 MED ORDER — ONDANSETRON 4 MG PO TBDP: 4.0000 mg | ORAL_TABLET | Freq: Three times a day (TID) | ORAL | 0 refills | Status: DC | PRN

## 2016-02-04 MED ORDER — MECLIZINE HCL 25 MG PO TABS: 25.0000 mg | ORAL_TABLET | Freq: Three times a day (TID) | ORAL | 0 refills | Status: DC | PRN

## 2016-02-04 NOTE — ED Provider Notes (Signed)
TIME SEEN: 4:30 AM  CHIEF COMPLAINT: Vertigo, ear pain  HPI: Pt is a 82 y.o. female with history of CAD, CHF, CK-MB who presents to the emergency department with vertigo. States she woke up just prior to arrival and states the room was spinning. States he was going "round and round". Denies ever having similar symptoms. States she has had some bilateral ear pain worse on the right side. States a neighbor of hers tried to pull wax out of her ear last week without success. She states she has been cleaning her ears with Q-tips. She has a history of numbness in the left arm which she reports is chronic. No new numbness or focal weakness. No headache. No chest pain. She does have chronic shortness of breath which she states is unchanged. Per nursing notes, patient told EMS she had palpitations but she denies this to me. States she did feel very panicked because of the vertigo. States she called her neighbor who recommended she call 911.  She denies recent fevers, cough, vomiting, diarrhea. No head injury. No headache. Symptoms have improved.   She also mentions pain in the lower back and left buttock that she has had for several weeks.  No fall or injury to this area. Again no numbness, tingling. No bowel or bladder incontinence. No urinary retention. States she did mention this to her primary care physician.  ROS: See HPI Constitutional: no fever  Eyes: no drainage  ENT: no runny nose   Cardiovascular:  no chest pain  Resp: no SOB  GI: no vomiting GU: no dysuria Integumentary: no rash  Allergy: no hives  Musculoskeletal: no leg swelling  Neurological: no slurred speech ROS otherwise negative  PAST MEDICAL HISTORY/PAST SURGICAL HISTORY:  Past Medical History:  Diagnosis Date  . Anemia   . Aortic insufficiency    mild  . Arthritis    "arms" (08/09/2015)  . CAD (coronary artery disease)    cath 08/09/2014 95% stenosis in prox to mid RCA s/p DES, 80-90% prox OM2, 50% distal LAD  . CHF  (congestive heart failure) ([place])   . CKD (chronic kidney disease) stage 3, GFR 30-59 ml/min   . Colon polyp    2009 colonoscopy, not retrieved for pathology  . Ectropion of left lower eyelid   . GERD (gastroesophageal reflux disease) 2009   EGD with benign gastric polyp too  . Hyperlipidemia   . Hypertension   . Stroke ([place]) 1975  . Vitamin D deficiency     MEDICATIONS:  Prior to Admission medications   Medication Sig Start Date End Date Taking? Authorizing Provider  acetaminophen (TYLENOL) 325 MG tablet Take 650 mg by mouth every 6 (six) hours as needed for mild pain.    Historical Provider, MD  albuterol (PROVENTIL HFA;VENTOLIN HFA) 108 (90 Base) MCG/ACT inhaler Inhale 2 puffs into the lungs every 4 (four) hours as needed for wheezing or shortness of breath. 08/14/15   Milagros Loll, MD  aspirin EC 81 MG tablet Take 81 mg by mouth daily.    Historical Provider, MD  atorvastatin (LIPITOR) 40 MG tablet Take 1 tablet (40 mg total) by mouth daily at 6 PM. 08/18/15   Lucious Groves, DO  bisoprolol (ZEBETA) 5 MG tablet take 1/2 tablet by mouth once daily 10/26/15   Milagros Loll, MD  furosemide (LASIX) 40 MG tablet take 1 tablet by mouth once daily 01/04/16   Milagros Loll, MD  isosorbide mononitrate (IMDUR) 30 MG 24 hr tablet Take  1 tablet (30 mg total) by mouth daily. 08/14/15   Milagros Loll, MD  losartan (COZAAR) 50 MG tablet take 1 tablet by mouth once daily 01/18/16   Milagros Loll, MD  potassium chloride SA (K-DUR,KLOR-CON) 20 MEQ tablet Take 1 tablet (20 mEq total) by mouth daily. 01/19/16   Milagros Loll, MD  Propylene Glycol-Glycerin (ARTIFICIAL TEARS) 1-0.3 % SOLN Apply 1-2 drops to eye as needed.    Historical Provider, MD    ALLERGIES:  No Known Allergies  SOCIAL HISTORY:  Social History  Substance Use Topics  . Smoking status: Former Smoker    Types: Cigarettes    Quit date: 07/02/1979  . Smokeless tobacco: Former Systems developer  . Alcohol use No    FAMILY  HISTORY: Family History  Problem Relation Age of Onset  . Stroke Mother   . Hypertension Mother   . Stroke Father   . Hypertension Father   . Diabetes Sister     EXAM: BP 123/82   Pulse 74   Temp 97.6 F (36.4 C) (Oral)   Resp 18   SpO2 100%  CONSTITUTIONAL: Alert and oriented and responds appropriately to questions. Elderly, chronically ill-appearing, in no significant distress HEAD: Normocephalic EYES: Conjunctivae clear, PERRL ENT: normal nose; no rhinorrhea; moist mucous membranes; No pharyngeal erythema or petechiae, no tonsillar hypertrophy or exudate, no uvular deviation, no trismus or drooling, normal phonation, no stridor, no dental caries or abscess noted, no Ludwig's angina, tongue sits flat in the bottom of the mouth; TMs are clear bilaterally without erythema, purulence, bulging, perforation, effusion.  Patient has bilateral cerumen impaction has been removed revealing no further foreign body in the external auditory canals and I am able to visualize both tympanic membranes easily. No bleeding in the external auditory canals. No inflammation, erythema or drainage from the external auditory canal. No signs of mastoiditis. No pain with manipulation of the pinna bilaterally. NECK: Supple, no meningismus, no LAD  CARD: RRR; S1 and S2 appreciated; no murmurs, no clicks, no rubs, no gallops RESP: Normal chest excursion without splinting or tachypnea; breath sounds clear and equal bilaterally; no wheezes, no rhonchi, no rales, no hypoxia or respiratory distress, speaking full sentences ABD/GI: Normal bowel sounds; non-distended; soft, non-tender, no rebound, no guarding, no peritoneal signs BACK:  The back appears normal and is non-tender to palpation, there is no CVA tenderness EXT: Normal ROM in all joints; non-tender to palpation; no edema; normal capillary refill; no cyanosis, no calf tenderness or swelling; no erythema or warmth, no joint effusion, no bony deformity or bony  tenderness, tender over the left buttocks, no erythema or warmth, no leg length discrepancy, 2+ DP pulses bilaterally  SKIN: Normal color for age and race; warm; no rash NEURO: Moves all extremities equally, patient reports diminished sensation throughout the left upper extremity which is chronic but otherwise sensation to light touch intact diffusely, cranial nerves II through XII intact PSYCH: The patient's mood and manner are appropriate. Grooming and personal hygiene are appropriate.  MEDICAL DECISION MAKING: Patient here with vertigo. She is a very poor historian.  Suspect some of her vertigo may be secondary to her cerumen impaction which was successfully removed she reports feeling better. She does have left upper extremity numbness that she states is chronic. No other focal neurologic deficit. We'll proceed with MRI of her brain given her multiple risk factors for CVA. She denies any new chest pain or shortness of breath. Denies any recent infectious symptoms.  ED PROGRESS:  Labs showed no significant change from her baseline. She has anemia, chronic kidney disease with mild metabolic acidosis which all appear to be chronic. Troponin negative.  Urine shows no sign of infection.   7:15 AM  Pt's MRI brain shows no acute abnormality.  Pt is able to ambulate with walker which is her baseline.  We'll discharge with meclizine, Zofran to take as needed for vertigo, nausea. Have advised her not to put anything into her ears anymore. Suspect peripheral vertigo which has resolved.   As for patient's left buttock pain, I feel that this is sciatica. She has full range of motion in this hip and no leg length discrepancy. 2+ DP pulses bilaterally and extremity is warm and well-perfused without sign of infection. No back pain on exam and no focal neurologic deficit. Have recommended Tylenol for pain at home and outpatient follow-up.    .Ear Cerumen Removal Date/Time: 02/04/2016 4:39 AM Performed by: Nyra Jabs Authorized by: Nyra Jabs   Consent:    Consent obtained:  Verbal   Consent given by:  Patient   Risks discussed:  Bleeding, infection, dizziness, incomplete removal, pain and TM perforation   Alternatives discussed:  Delayed treatment Procedure details:    Location:  L ear and R ear   Procedure type: forceps Post-procedure details:    Inspection:  TM intact   Hearing quality:  Improved   Patient tolerance of procedure:  Tolerated well, no immediate complications     Date: [id number] 4:44 AM  Rate: 69  Rhythm: normal sinus rhythm  QRS Axis: normal  Intervals: normal  ST/T Wave abnormalities: normal  Conduction Disutrbances: none  Narrative Interpretation: unremarkable; no signficant change since last tracing       [employer], DO 02/04/16 [id number]

## 2016-02-04 NOTE — ED Notes (Signed)
Pt still in MRI 

## 2016-02-04 NOTE — ED Triage Notes (Signed)
Pt arrives by [facility]. Pt called EMS because she woke up feeling like she was in a panic and her heart was racing. This resolved before EMS arrived. Pt also stated she has sharp pain in the back of her left thigh that has been going on for 2 weeks. Had a recent appt with her doctor, states she mentioned it but doesn't know what they did about it.

## 2016-02-04 NOTE — ED Notes (Signed)
Patient transported to X-ray 

## 2016-02-04 NOTE — ED Notes (Signed)
Pt ambulated in hallway with walker, unable to walk without walker when getting into apt building and to the elevator. Has called sister to try to arrange ride, no one can come to get pt, no one is able to get walker to the door.

## 2016-02-04 NOTE — Discharge Instructions (Signed)
You may take Tylenol 1000 mg every 6 hours as needed for pain. °

## 2016-02-04 NOTE — ED Notes (Signed)
Patient transported to MRI 

## 2016-02-04 NOTE — ED Notes (Signed)
Pt asked for water to try and stimulate urine before resorting to I&O cath. Asked Dr. Leonides Schanz and she said yes. Pt given ice water.

## 2016-02-09 ENCOUNTER — Other Ambulatory Visit: Payer: Self-pay

## 2016-02-13 NOTE — Patient Outreach (Signed)
[place]) Care Management  02/09/2016   Successful outreach completed with patient. Patient alert and oriented x 3. Currently denies any pain or shortness of breath. Patient reports a recent visit to the emergency department. She stated that when she woke up the room was "spinning around and around" so she called the ambulance. She stated that at the hospital, she was put in a real dark tunnel to do some work, but does not remember what it was. RNCM asked if she was talking about having a MRI and she said she is not sure. She continued to state that they gave her some medication and a shot in the emergency department and she is better. Patient currently denies any continued dizziness or problems.  Patient stated that she is getting ready to eat some soup and requested to end call. RNCM advised will follow up in a few weeks. Verified patient still knows how to contact [facility] and she stated that she has RNCM card.  Encouraged to call with any questions or concerns.  Jane R. Shaunn Tackitt, RN, BSN, [place] Management Coordinator [phone]

## 2016-02-21 ENCOUNTER — Other Ambulatory Visit: Payer: Self-pay

## 2016-02-21 NOTE — Patient Outreach (Signed)
[place] [facility]) Care Management  02/21/16  KYLEA CHAPMAN [birth year] [id number]  Attempted to reach patient without success. Phone rang multiple times with no answer and no option to leave a voicemail.  [place] R. Maeli Spacek, RN, BSN, [place] Management Coordinator [phone]

## 2016-02-23 ENCOUNTER — Other Ambulatory Visit: Payer: Self-pay | Admitting: Pulmonary Disease

## 2016-03-11 ENCOUNTER — Other Ambulatory Visit: Payer: Self-pay | Admitting: Pulmonary Disease

## 2016-03-21 ENCOUNTER — Other Ambulatory Visit: Payer: Self-pay

## 2016-03-21 NOTE — Patient Outreach (Signed)
[place] [facility]) Care Management  03/21/16  Jane [name] 11/02/1928 [id number]  Successful outreach completed with patient. Patient identification verified.  Patient denies any complaints at present and stated she has been feeling okay. Stated that she has not had any shortness of breath, "as long as I take my time."   Patient stated that she currently does not have any pain, but does report that she has intermittent back pain that she has always had. Stated that it is in the lower left side of her back right at the top of the buttock and she takes Tylenol for it. She reports that sometimes it helps, but she is "just used to it."   Patient denies any swelling in her extremities and no shortness of breath or wheezing since last outreach. She also denies any falls since last outreach. Patient reports that she is still weighing herself every day but does not write it down because she always weighs about the same. Stated she weighed 159 today. She stated that she is normally always between 159 and 161. RNCM educated patient about importance of monitoring weight for weight gain of 3 lbs and patient adamant she is always the same. Although patient is not able to verbalize all of the signs to watch for in the yellow zone of heart failure, she is able to state where she has this posted on her fridge and reports that she looks at it every day. She stated that she knows that if she cannot breathe, or she has any chest pain, that she will call 911.   Patient stated that she does need to get groceries. She continued to state that her nephew will be there tomorrow to take her shopping.   Patient has no other questions or concerns at present. She reports that she still has  RNCM contact information posted on her fridge and will call if she has any needs. RNCM will outreach to patient one additional time in a few weeks and if she continues to be stable, will close.   [place] R. Jacobo Moncrief, RN,  BSN, [place] Management Coordinator [phone]

## 2016-04-08 ENCOUNTER — Ambulatory Visit: Payer: Self-pay

## 2016-04-10 ENCOUNTER — Ambulatory Visit: Payer: Self-pay

## 2016-04-11 ENCOUNTER — Other Ambulatory Visit: Payer: Self-pay

## 2016-04-11 ENCOUNTER — Telehealth: Payer: Self-pay | Admitting: *Deleted

## 2016-04-11 NOTE — Telephone Encounter (Signed)
Jane [name] called back and had spoke w/ pt, she had her write her appt time down and put on calendar also pt's nephew will bring her tomorrow for the appt Jane [name] also states that pt may be having problems because getting 90 day supplies of meds confuse the pt, that this time she got 3 inhalers instead of 1 and pt seems to think that they arent as strong since she got 3. So we need to do only 30 days of her meds I also called the pharmacy and made sure they only give her 30 days worth from now on, spoke w/ mike the pharmacist at [facility] and he is agreeable

## 2016-04-11 NOTE — Patient Outreach (Signed)
[facility]) Care Management  04/11/16  Jane [name]  11-13-1928 [id number]  RNCM received call from [facility] at [facility] stating that the patient had called today and complained that her inhalers are not working as well as they used to. She reports she scheduled patient for an appointment for tomorrow 10/13 at 3:45 pm, but patient reported she may have difficulty with transportation. She requested assistance with ensuring patient was able to make it to her appointment.  RNCM successfully outreached patient. Patient identification verified.  Advised purpose of call and patient stated that she is doing ok today. She reports that she had a headache earlier, but " I guess I just worry a lot." When RNCM asked what patient was worried about, she reported "nothing really, I just worry." Patient stated that she sometimes gets winded, but stated that she has her inhalers and just takes her time. She stated that when she gets in a hurry, she gets winded, but she keeps her inhalers close by. Patient does report an episode of shortness of breath this morning because she was rushing to get to the bathroom. She reported that she used her inhaler and she began to feel better. Patient does report that when she uses her inhaler for shortness of breath, she does get relief from the inhalers. She reports that the last time she was at the pharmacy, they gave her 3 inhalers, but she is not sure that they are full. RNCM educated patient that they are all the same medication and that they are meant to last 90 days instead of 1 month. Patient verbalized understanding, but still seems confused at why they sent her 3 inhalers.   Patient confirms she did call her doctor's office today to try to find out when her next appointment is and she said it was supposed to be in 6 months. She said that they scheduled her an appointment for tomorrow, but she did not write down the time and cannot  remember it. RNCM advised calling to see if she would be able to arrange transportation to the appointment and she stated that her nephew would take her. Patient stated that she has not called him yet, so she is not sure if he is available. Patient very forgetful today, asking repeatedly to tell her when her appointment time is. RNCM encouraged patient to write the appointment down and call her nephew now to set up transportation and call back to let RNCM know if he is available. Advised RNCM can assist with setting up transportation if he is not available. Patient stated she would call right now and [name] reminded patient of appointment time.  Patient returned call and stated that her nephew, Jane [name] will be taking her to her appointment tomorrow. She stated that she could not remember her appointment time. RNCM encouraged patient to write it on her calendar and reminded patient of appointment date and time. Patient currently denies any shortness of breath or any other concerns or needs. Encouraged to call RNCM back with any questions and to make sure she calls if for some reason Jane [name] is unable to take her to her appointment. RNCM stressed the importance of keeping her appointment and patient verbalized understanding.  RNCM will follow up with patient next week.  [place] R. Drue Camera, RN, BSN, [place] Management Coordinator [phone]

## 2016-04-11 NOTE — Telephone Encounter (Signed)
Agree with appt Thanks 

## 2016-04-11 NOTE — Telephone Encounter (Signed)
Pt calls and states she doesn't think her inhaler is working as well as it used to, she states she gets a little short of breath from time to time, denies chest pain, h/a, N&V,  She is offered an appt today in [facility] and states she cannot come due to transportation, gave her an appt for tomorrow afternoon and told her if she becomes worse to please call 911 also discussed getting her help with transportation. She has an appt today w/ [facility] RN, called [place] s. RN [facility] and made her aware of pt's complaint and appt, she will try to help pt get transportation and reinforce the advisement given pt. appt 10/13 at 15[facility] Sending note to pcp, attending, shanag. Csw and victoria [facility]

## 2016-04-12 ENCOUNTER — Ambulatory Visit (INDEPENDENT_AMBULATORY_CARE_PROVIDER_SITE_OTHER): Payer: Medicare Other | Admitting: Internal Medicine

## 2016-04-12 VITALS — BP 149/58 | HR 70 | Temp 97.8°F | Wt 167.3 lb

## 2016-04-12 DIAGNOSIS — J449 Chronic obstructive pulmonary disease, unspecified: Secondary | ICD-10-CM | POA: Diagnosis not present

## 2016-04-12 DIAGNOSIS — Z79899 Other long term (current) drug therapy: Secondary | ICD-10-CM | POA: Diagnosis not present

## 2016-04-12 DIAGNOSIS — I5030 Unspecified diastolic (congestive) heart failure: Secondary | ICD-10-CM

## 2016-04-12 DIAGNOSIS — L608 Other nail disorders: Secondary | ICD-10-CM | POA: Diagnosis not present

## 2016-04-12 DIAGNOSIS — Z87891 Personal history of nicotine dependence: Secondary | ICD-10-CM

## 2016-04-12 DIAGNOSIS — Z7982 Long term (current) use of aspirin: Secondary | ICD-10-CM

## 2016-04-12 DIAGNOSIS — B351 Tinea unguium: Secondary | ICD-10-CM

## 2016-04-12 DIAGNOSIS — I5032 Chronic diastolic (congestive) heart failure: Secondary | ICD-10-CM

## 2016-04-12 NOTE — Patient Instructions (Signed)
You were seen today because there was concern on the phone that your breathing had gotten worse.  However, it sounds like your breathing is ok and your heart failure is stable.  It does look like your toenails are thick and curled, and that a foot doctor could help you grind them down.  I will refer you to a podiatrist.  If your breathing gets worse, you have chest pain, or swelling in your legs, please call the clinic.

## 2016-04-12 NOTE — Assessment & Plan Note (Signed)
She is euvolemic and breathing is at baseline. -Continue current bisoprolol, furosemide, imdur, losartan

## 2016-04-12 NOTE — Progress Notes (Signed)
   CC: "I'm feeling pretty good."  HPI:  [name] is a 82 y.o. woman with history of CHF, CAD, CVA, and HTN who presents with for shortness of breath.  She called the clinic yesterday and stated that she had been having some concern that her albuterol inhaler was not working as well as it used to. She has albuterol inhaler PRN for her COPD.  Today, she says that her breathing is at baseline and that she only occasionally gets short of breath when she walks "in a big hurry".  No orthopnea, PND, no edema.  Complains of thick, overgrown toenails which she cannot care for.   Past Medical History:  Diagnosis Date  . Anemia   . Aortic insufficiency    mild  . Arthritis    "arms" (08/09/2015)  . CAD (coronary artery disease)    cath 08/09/2014 95% stenosis in prox to mid RCA s/p DES, 80-90% prox OM2, 50% distal LAD  . CHF (congestive heart failure) ([place])   . CKD (chronic kidney disease) stage 3, GFR 30-59 ml/min   . Colon polyp    2009 colonoscopy, not retrieved for pathology  . Ectropion of left lower eyelid   . GERD (gastroesophageal reflux disease) 2009   EGD with benign gastric polyp too  . Hyperlipidemia   . Hypertension   . Stroke ([place]) 1975  . Vitamin D deficiency     Review of Systems: Review of Systems  Constitutional: Negative for chills and fever.  Respiratory: Negative for cough, shortness of breath and wheezing.   Cardiovascular: Negative for chest pain, palpitations, orthopnea, leg swelling and PND.  Skin:       Thick, overgrown toenails   Physical Exam:  Vitals:   04/12/16 1556  BP: (!) 149/58  Pulse: 70  Temp: 97.8 F (36.6 C)  TempSrc: Oral  Weight: 167 lb 4.8 oz (75.9 kg)   O2 Sat 97% with brief ambulation  Physical Exam  Constitutional: She is oriented to person, place, and time. She appears well-developed and well-nourished. No distress.  Cardiovascular:  Regular rate and rhythm, 3/6 systolic murmor  Pulmonary/Chest: Effort normal and  breath sounds normal. No respiratory distress. She has no wheezes.  Musculoskeletal: She exhibits no edema.  Neurological: She is alert and oriented to person, place, and time.  Skin:  Toenails grossly thickened, curled, and opaque yellowish  Psychiatric: She has a normal mood and affect. Her behavior is normal.     CBC Latest Ref Rng & Units 02/04/2016 02/01/2016 12/27/2015  WBC 4.0 - 10.5 K/uL 3.7(L) 3.8 3.4(L)  Hemoglobin 12.0 - 15.0 g/dL 9.0(L) - 10.0(L)  Hematocrit 36.0 - 46.0 % 27.4(L) 29.8(L) 29.5(L)  Platelets 150 - 400 K/uL 97(L) 119(L) 101(L)    PFTs (08/31/2015) FEV1/FVC 83% FEV1 145% No bronchodilator respone DLCO 28% "Pt. had difficulty wtih DLCO portion of the test Pt. not taking in deep breath at the appropriate time. The result of this test meets ATS standards although DLCO received message Inspiratory Volume <85% of VC"  Echocardiogram (08/10/2015) EF 55-60% No valvular disease   Assessment & Plan:   See Encounters Tab for problem based charting.  Patient seen with Dr. Lynnae January

## 2016-04-12 NOTE — Assessment & Plan Note (Signed)
Thick, opaque, overgrown toenails consistent with onychomycosis.  She had a podiatry appointment but was not able to attend. -Renew podiatry referral

## 2016-04-15 NOTE — Progress Notes (Signed)
Internal Medicine Clinic Attending  I saw and evaluated the patient.  I personally confirmed the key portions of the history and exam documented by Dr. O'Sullivan and I reviewed pertinent patient test results.  The assessment, diagnosis, and plan were formulated together and I agree with the documentation in the resident's note.   

## 2016-04-19 ENCOUNTER — Other Ambulatory Visit: Payer: Self-pay

## 2016-04-19 NOTE — Patient Outreach (Signed)
[facility]) Care Management  04/19/16  Jane [name] May 17, 1929 [id number]  Successful outreach completed with patient. Patient identification verified. Patient stated that she has been doing well with no complaints. She denies any shortness of breath.  She stated that her appointment at PCP last week went well. She continued to state that she needs an appointment with a foot doctor to get her toenails cut and her PCP office is going to schedule and mail her the appointment. RNCM offered to call and schedule the appointment as it appears that it has not yet been made and patient declined. She stated she would wait and see if they mail it to her.   Patient stated that she has no questions about her inhalers. Stated that she gets short of breath when she moves too fast, but she just slows down and uses her inhalers as needed and she feels better. Patient continues to weigh herself daily and has no changes in weight. She denies any swelling in her hands, feet or abdomen. Patient is managing her heart failure well.   Patient has no other needs at present. Will follow up in a couple of weeks to ensure she gets podiatry appointment scheduled.  Jane R. Jaquilla Woodroof, RN, BSN, [place] Management Coordinator [phone]

## 2016-04-22 ENCOUNTER — Encounter: Payer: Self-pay | Admitting: Pulmonary Disease

## 2016-04-23 ENCOUNTER — Other Ambulatory Visit: Payer: Self-pay | Admitting: Pulmonary Disease

## 2016-05-20 ENCOUNTER — Observation Stay (HOSPITAL_COMMUNITY)
Admission: EM | Admit: 2016-05-20 | Discharge: 2016-05-21 | Disposition: A | Discharge status: Home / Self Care | Payer: Medicare Other | Attending: Oncology | Admitting: Oncology

## 2016-05-20 ENCOUNTER — Emergency Department (HOSPITAL_COMMUNITY): Payer: Medicare Other

## 2016-05-20 ENCOUNTER — Encounter (HOSPITAL_COMMUNITY): Payer: Self-pay | Admitting: Internal Medicine

## 2016-05-20 DIAGNOSIS — R079 Chest pain, unspecified: Secondary | ICD-10-CM | POA: Diagnosis not present

## 2016-05-20 DIAGNOSIS — I509 Heart failure, unspecified: Secondary | ICD-10-CM | POA: Insufficient documentation

## 2016-05-20 DIAGNOSIS — Z8673 Personal history of transient ischemic attack (TIA), and cerebral infarction without residual deficits: Secondary | ICD-10-CM | POA: Insufficient documentation

## 2016-05-20 DIAGNOSIS — Z7982 Long term (current) use of aspirin: Secondary | ICD-10-CM | POA: Diagnosis not present

## 2016-05-20 DIAGNOSIS — M545 Low back pain, unspecified: Secondary | ICD-10-CM

## 2016-05-20 DIAGNOSIS — N183 Chronic kidney disease, stage 3 (moderate): Secondary | ICD-10-CM | POA: Diagnosis not present

## 2016-05-20 DIAGNOSIS — M6281 Muscle weakness (generalized): Secondary | ICD-10-CM | POA: Insufficient documentation

## 2016-05-20 DIAGNOSIS — I130 Hypertensive heart and chronic kidney disease with heart failure and stage 1 through stage 4 chronic kidney disease, or unspecified chronic kidney disease: Secondary | ICD-10-CM | POA: Insufficient documentation

## 2016-05-20 DIAGNOSIS — R0789 Other chest pain: Secondary | ICD-10-CM | POA: Diagnosis not present

## 2016-05-20 DIAGNOSIS — Z79899 Other long term (current) drug therapy: Secondary | ICD-10-CM | POA: Insufficient documentation

## 2016-05-20 DIAGNOSIS — R0602 Shortness of breath: Secondary | ICD-10-CM | POA: Insufficient documentation

## 2016-05-20 DIAGNOSIS — I2510 Atherosclerotic heart disease of native coronary artery without angina pectoris: Secondary | ICD-10-CM | POA: Insufficient documentation

## 2016-05-20 DIAGNOSIS — I5030 Unspecified diastolic (congestive) heart failure: Secondary | ICD-10-CM | POA: Diagnosis present

## 2016-05-20 DIAGNOSIS — M7062 Trochanteric bursitis, left hip: Secondary | ICD-10-CM

## 2016-05-20 DIAGNOSIS — Z87891 Personal history of nicotine dependence: Secondary | ICD-10-CM | POA: Diagnosis not present

## 2016-05-20 LAB — COMPREHENSIVE METABOLIC PANEL
ALBUMIN: 4.1 g/dL (ref 3.5–5.0)
ALT: 16 U/L (ref 14–54)
ANION GAP: 11 (ref 5–15)
AST: 26 U/L (ref 15–41)
Alkaline Phosphatase: 43 U/L (ref 38–126)
BILIRUBIN TOTAL: 1.4 mg/dL — AB (ref 0.3–1.2)
BUN: 18 mg/dL (ref 6–20)
CO2: 21 mmol/L — AB (ref 22–32)
Calcium: 9 mg/dL (ref 8.9–10.3)
Chloride: 111 mmol/L (ref 101–111)
Creatinine, Ser: 1.04 mg/dL — ABNORMAL HIGH (ref 0.44–1.00)
GFR calc Af Amer: 55 mL/min — ABNORMAL LOW (ref >60)
GFR calc non Af Amer: 48 mL/min — ABNORMAL LOW (ref >60)
GLUCOSE: 84 mg/dL (ref 65–99)
POTASSIUM: 3.8 mmol/L (ref 3.5–5.1)
SODIUM: 143 mmol/L (ref 135–145)
TOTAL PROTEIN: 6.4 g/dL — AB (ref 6.5–8.1)

## 2016-05-20 LAB — CBC WITH DIFFERENTIAL/PLATELET
BASOS PCT: 0 %
Basophils Absolute: 0 10*3/uL (ref 0.0–0.1)
EOS ABS: 0 10*3/uL (ref 0.0–0.7)
EOS PCT: 0 %
HCT: 28.6 % — ABNORMAL LOW (ref 36.0–46.0)
Hemoglobin: 9.6 g/dL — ABNORMAL LOW (ref 12.0–15.0)
Lymphocytes Relative: 43 %
Lymphs Abs: 1.3 10*3/uL (ref 0.7–4.0)
MCH: 31.8 pg (ref 26.0–34.0)
MCHC: 33.6 g/dL (ref 30.0–36.0)
MCV: 94.7 fL (ref 78.0–100.0)
MONO ABS: 0.2 10*3/uL (ref 0.1–1.0)
MONOS PCT: 7 %
Neutro Abs: 1.5 10*3/uL — ABNORMAL LOW (ref 1.7–7.7)
Neutrophils Relative %: 50 %
PLATELETS: 85 10*3/uL — AB (ref 150–400)
RBC: 3.02 MIL/uL — ABNORMAL LOW (ref 3.87–5.11)
RDW: 13.1 % (ref 11.5–15.5)
WBC: 3 10*3/uL — ABNORMAL LOW (ref 4.0–10.5)

## 2016-05-20 LAB — VITAMIN B12: Vitamin B-12: 305 pg/mL (ref 180–914)

## 2016-05-20 LAB — SAVE SMEAR

## 2016-05-20 LAB — BRAIN NATRIURETIC PEPTIDE: B NATRIURETIC PEPTIDE 5: 295.1 pg/mL — AB (ref 0.0–100.0)

## 2016-05-20 LAB — I-STAT TROPONIN, ED: Troponin i, poc: 0.01 ng/mL (ref 0.00–0.08)

## 2016-05-20 LAB — TROPONIN I

## 2016-05-20 MED ORDER — FUROSEMIDE 10 MG/ML IJ SOLN
20.0000 mg | Freq: Once | INTRAMUSCULAR | Status: AC
  Administered 2016-05-20: 20 mg via INTRAVENOUS
  Filled 2016-05-20: qty 2

## 2016-05-20 MED ORDER — POLYVINYL ALCOHOL 1.4 % OP SOLN
1.0000 [drp] | Freq: Every day | OPHTHALMIC | Status: DC | PRN
  Filled 2016-05-20: qty 15

## 2016-05-20 MED ORDER — SODIUM CHLORIDE 0.9% FLUSH
3.0000 mL | Freq: Two times a day (BID) | INTRAVENOUS | Status: DC
  Administered 2016-05-20: 3 mL via INTRAVENOUS

## 2016-05-20 MED ORDER — ACETAMINOPHEN 325 MG PO TABS: 650.0000 mg | ORAL_TABLET | ORAL | Status: DC | PRN

## 2016-05-20 MED ORDER — ISOSORBIDE MONONITRATE ER 30 MG PO TB24
30.0000 mg | ORAL_TABLET | Freq: Every day | ORAL | Status: DC
  Administered 2016-05-21: 30 mg via ORAL
  Filled 2016-05-20: qty 1

## 2016-05-20 MED ORDER — ENOXAPARIN SODIUM 40 MG/0.4ML ~~LOC~~ SOLN: 40.0000 mg | SUBCUTANEOUS | Status: DC

## 2016-05-20 MED ORDER — ATORVASTATIN CALCIUM 40 MG PO TABS
40.0000 mg | ORAL_TABLET | Freq: Every day | ORAL | Status: DC
  Administered 2016-05-20 – 2016-05-21 (×2): 40 mg via ORAL
  Filled 2016-05-20 (×2): qty 1

## 2016-05-20 MED ORDER — BISOPROLOL FUMARATE 5 MG PO TABS
2.5000 mg | ORAL_TABLET | Freq: Every day | ORAL | Status: DC
  Administered 2016-05-21: 2.5 mg via ORAL
  Filled 2016-05-20: qty 1

## 2016-05-20 MED ORDER — ASPIRIN EC 81 MG PO TBEC
81.0000 mg | DELAYED_RELEASE_TABLET | Freq: Every day | ORAL | Status: DC
  Administered 2016-05-21: 81 mg via ORAL
  Filled 2016-05-20: qty 1

## 2016-05-20 MED ORDER — SODIUM CHLORIDE 0.9 % IV SOLN: 250.0000 mL | INTRAVENOUS | Status: DC | PRN

## 2016-05-20 MED ORDER — ONDANSETRON HCL 4 MG/2ML IJ SOLN: 4.0000 mg | Freq: Four times a day (QID) | INTRAMUSCULAR | Status: DC | PRN

## 2016-05-20 MED ORDER — LOSARTAN POTASSIUM 50 MG PO TABS
50.0000 mg | ORAL_TABLET | Freq: Every day | ORAL | Status: DC
  Administered 2016-05-21: 50 mg via ORAL
  Filled 2016-05-20: qty 1

## 2016-05-20 MED ORDER — SODIUM CHLORIDE 0.9% FLUSH
3.0000 mL | INTRAVENOUS | Status: DC | PRN
  Administered 2016-05-21: 3 mL via INTRAVENOUS
  Filled 2016-05-20: qty 3

## 2016-05-20 MED ORDER — ALBUTEROL SULFATE (2.5 MG/3ML) 0.083% IN NEBU: 2.5000 mg | INHALATION_SOLUTION | RESPIRATORY_TRACT | Status: DC | PRN

## 2016-05-20 NOTE — ED Provider Notes (Signed)
[facility] DEPT Provider Note   CSN: [id number] Arrival date & time: 05/20/16  1111     History   Chief Complaint Chief Complaint  Patient presents with  . Shortness of Breath    started at 10am  . Chest Pain    HPI Jane [name] is a 82 y.o. female.  Patient complains of chest pain and shortness of breath on exertion   The history is provided by the patient.  Shortness of Breath  This is a recurrent problem. The problem occurs frequently.The current episode started 12 to 24 hours ago. The problem has not changed since onset.Associated symptoms include chest pain. Pertinent negatives include no fever, no headaches, no cough, no abdominal pain and no rash. It is unknown what precipitated the problem. Risk factors: History coronary disease. She has tried nothing for the symptoms. The treatment provided no relief.  Chest Pain   Associated symptoms include shortness of breath. Pertinent negatives include no abdominal pain, no back pain, no cough, no fever and no headaches.  Pertinent negatives for past medical history include no seizures.    Past Medical History:  Diagnosis Date  . Anemia   . Aortic insufficiency    mild  . Arthritis    "arms" (08/09/2015)  . CAD (coronary artery disease)    cath 08/09/2014 95% stenosis in prox to mid RCA s/p DES, 80-90% prox OM2, 50% distal LAD  . CHF (congestive heart failure) ([place])   . CKD (chronic kidney disease) stage 3, GFR 30-59 ml/min   . Colon polyp    2009 colonoscopy, not retrieved for pathology  . Ectropion of left lower eyelid   . GERD (gastroesophageal reflux disease) 2009   EGD with benign gastric polyp too  . Hyperlipidemia   . Hypertension   . Stroke ([place]) 1975  . Vitamin D deficiency     Patient Active Problem List   Diagnosis Date Noted  . Chest pain 05/20/2016  . Pancytopenia ([place]) 12/27/2015  . CKD (chronic kidney disease) stage 3, GFR 30-59 ml/min 03/31/2015  . Prediabetes 10/16/2014  . Status post  insertion of drug-eluting stent into right coronary artery for coronary artery disease 08/07/2014  . Seborrheic keratoses 08/05/2014  . Physical deconditioning 05/31/2014  . Dementia 04/28/2014  . Heart failure with preserved ejection fraction ([place]) 12/27/2012  . Ectropion of left lower eyelid 12/17/2012  . Onychomycosis of toenail 09/18/2012  . PAD (peripheral artery disease) ([place]) 09/18/2012  . Preventative health care 09/18/2012  . Vitamin D deficiency 04/25/2008  . C V A / STROKE 10/29/2007  . Hyperlipidemia 09/24/2006  . Normocytic anemia 09/24/2006  . Essential hypertension 09/24/2006  . OSTEOPENIA 09/24/2006    Past Surgical History:  Procedure Laterality Date  . ABDOMINAL HYSTERECTOMY    . APPENDECTOMY    . ARTERY BIOPSY Left 12/16/2012   Procedure: BIOPSY TEMPORAL ARTERY;  Surgeon: Mal Misty, MD;  Location: [facility];  Service: Vascular;  Laterality: Left;  . CARDIAC CATHETERIZATION    . LEFT HEART CATHETERIZATION WITH CORONARY ANGIOGRAM N/A 08/09/2014   Procedure: LEFT HEART CATHETERIZATION WITH CORONARY ANGIOGRAM;  Surgeon: Peter M Martinique, MD;  Location: [facility] CATH LAB;  Service: Cardiovascular;  Laterality: N/A;  . PERCUTANEOUS CORONARY STENT INTERVENTION (PCI-S)  08/09/2014   Procedure: PERCUTANEOUS CORONARY STENT INTERVENTION (PCI-S);  Surgeon: Peter M Martinique, MD;  Location: [facility] CATH LAB;  Service: Cardiovascular;;  . TONSILLECTOMY      OB History    No data available  Home Medications    Prior to Admission medications   Medication Sig Start Date End Date Taking? Authorizing Provider  acetaminophen (TYLENOL) 325 MG tablet Take 650 mg by mouth every 6 (six) hours as needed for mild pain.    Historical Provider, MD  albuterol (PROAIR HFA) 108 (90 Base) MCG/ACT inhaler Inhale 1-2 puffs into the lungs every 4 (four) hours as needed for wheezing or shortness of breath. 03/12/16   Milagros Loll, MD  aspirin EC 81 MG tablet Take 81 mg by mouth daily.    Historical  Provider, MD  atorvastatin (LIPITOR) 40 MG tablet Take 1 tablet (40 mg total) by mouth daily at 6 PM. 08/18/15   Lucious Groves, DO  bisoprolol (ZEBETA) 5 MG tablet take 1/2 tablet by mouth once daily 10/26/15   Milagros Loll, MD  furosemide (LASIX) 40 MG tablet take 1 tablet by mouth once daily 04/24/16   Milagros Loll, MD  isosorbide mononitrate (IMDUR) 30 MG 24 hr tablet take 1 tablet by mouth once daily 02/23/16   Milagros Loll, MD  losartan (COZAAR) 50 MG tablet take 1 tablet by mouth once daily 02/23/16   Milagros Loll, MD  meclizine (ANTIVERT) 25 MG tablet Take 1 tablet (25 mg total) by mouth 3 (three) times daily as needed for dizziness. 02/04/16   Kristen N Ward, DO  ondansetron (ZOFRAN ODT) 4 MG disintegrating tablet Take 1 tablet (4 mg total) by mouth every 8 (eight) hours as needed for nausea or vomiting. 02/04/16   Kristen N Ward, DO  potassium chloride SA (K-DUR,KLOR-CON) 20 MEQ tablet Take 1 tablet (20 mEq total) by mouth daily. 01/19/16   Milagros Loll, MD  Propylene Glycol-Glycerin (ARTIFICIAL TEARS) 1-0.3 % SOLN Apply 1-2 drops to eye daily as needed (dry eyes).     Historical Provider, MD    Family History Family History  Problem Relation Age of Onset  . Stroke Mother   . Hypertension Mother   . Stroke Father   . Hypertension Father   . Diabetes Sister     Social History Social History  Substance Use Topics  . Smoking status: Former Smoker    Types: Cigarettes    Quit date: 07/02/1979  . Smokeless tobacco: Former Systems developer  . Alcohol use No     Allergies   Patient has no known allergies.   Review of Systems Review of Systems  Constitutional: Negative for appetite change, fatigue and fever.  HENT: Negative for congestion, ear discharge and sinus pressure.   Eyes: Negative for discharge.  Respiratory: Positive for shortness of breath. Negative for cough.   Cardiovascular: Positive for chest pain.  Gastrointestinal: Negative for abdominal pain and diarrhea.    Genitourinary: Negative for frequency and hematuria.  Musculoskeletal: Negative for back pain.  Skin: Negative for rash.  Neurological: Negative for seizures and headaches.  Psychiatric/Behavioral: Negative for hallucinations.     Physical Exam Updated Vital Signs BP (!) 142/50   Pulse 66   Temp 98.2 F (36.8 C) (Oral)   Resp 14   Ht 5\' 4"  (1.626 m)   Wt 163 lb (73.9 kg)   SpO2 99%   BMI 27.98 kg/m   Physical Exam  Constitutional: She is oriented to person, place, and time. She appears well-developed.  HENT:  Head: Normocephalic.  Eyes: Conjunctivae and EOM are normal. No scleral icterus.  Neck: Neck supple. No thyromegaly present.  Cardiovascular: Normal rate and regular rhythm.  Exam reveals no gallop and  no friction rub.   No murmur heard. Pulmonary/Chest: No stridor. She has no wheezes. She has no rales. She exhibits no tenderness.  Abdominal: She exhibits no distension. There is no tenderness. There is no rebound.  Musculoskeletal: Normal range of motion. She exhibits no edema.  Lymphadenopathy:    She has no cervical adenopathy.  Neurological: She is oriented to person, place, and time. She exhibits normal muscle tone. Coordination normal.  Skin: No rash noted. No erythema.  Psychiatric: She has a normal mood and affect. Her behavior is normal.     ED Treatments / Results  Labs (all labs ordered are listed, but only abnormal results are displayed) Labs Reviewed  CBC WITH DIFFERENTIAL/PLATELET - Abnormal; Notable for the following:       Result Value   WBC 3.0 (*)    RBC 3.02 (*)    Hemoglobin 9.6 (*)    HCT 28.6 (*)    Platelets 85 (*)    Neutro Abs 1.5 (*)    All other components within normal limits  COMPREHENSIVE METABOLIC PANEL - Abnormal; Notable for the following:    CO2 21 (*)    Creatinine, Ser 1.04 (*)    Total Protein 6.4 (*)    Total Bilirubin 1.4 (*)    GFR calc non Af Amer 48 (*)    GFR calc Af Amer 55 (*)    All other components within  normal limits  BRAIN NATRIURETIC PEPTIDE - Abnormal; Notable for the following:    B Natriuretic Peptide 295.1 (*)    All other components within normal limits  I-STAT TROPOININ, ED    EKG  EKG Interpretation  Date/Time:  Monday May 20 2016 11:16:30 EST Ventricular Rate:  67 PR Interval:    QRS Duration: 84 QT Interval:  413 QTC Calculation: 436 R Axis:   16 Text Interpretation:  Sinus rhythm Probable left atrial enlargement Low voltage, precordial leads Abnormal R-wave progression, early transition Confirmed by Kamdin Follett  MD, Sequoyah Counterman [phone]) on 05/20/2016 2:35:18 PM       Radiology Dg Chest Port 1 View  Result Date: 05/20/2016 CLINICAL DATA:  Increased shortness of breath and chest pain. EXAM: PORTABLE CHEST 1 VIEW COMPARISON:  02/04/2016 and 08/09/2015 FINDINGS: Again noted are prominent central vascular structures. Difficult to exclude some vascular congestion. Atherosclerotic calcifications at the aortic arch. Heart size is mildly enlarged and stable. Degenerative changes in the [facility] joints bilaterally. Patient is slightly rotated towards left. IMPRESSION: Prominent central vascular structures appear chronic but cannot exclude mild vascular congestion. Stable mild cardiomegaly. Electronically Signed   By: Markus Daft M.D.   On: 05/20/2016 12:15    Procedures Procedures (including critical care time)  Medications Ordered in ED Medications - No data to display   Initial Impression / Assessment and Plan / ED Course  I have reviewed the triage vital signs and the nursing notes.  Pertinent labs & imaging results that were available during my care of the patient were reviewed by me and considered in my medical decision making (see chart for details).  Clinical Course     Patient with chest pain and dyspnea on exertion. She will be admitted to medicine for further workup  Final Clinical Impressions(s) / ED Diagnoses   Final diagnoses:  Atypical chest pain    New  Prescriptions New Prescriptions   No medications on file     Milton Ferguson, MD 05/20/16 1511

## 2016-05-20 NOTE — ED Notes (Signed)
Patient stable and ready to be transported to 3W at this time. Belongings taken to the floor with the patient.

## 2016-05-20 NOTE — H&P (Signed)
Date: 05/20/2016               Patient Name:  Jane [name] MRN: [id number]  DOB: 10-27-1928 Age / Sex: 82 y.o., female   PCP: Milagros Loll, MD         Medical Service: Internal Medicine Teaching Service         Attending Physician: Dr. Algis Greenhouse    First Contact: Dr. Asencion Partridge Pager: [phone]  Second Contact: Dr. Burgess Estelle Pager: [phone]       After Hours (After 5p/  First Contact Pager: [phone]  weekends / holidays): Second Contact Pager: [phone]   Chief Complaint: Shortness of breath, chest pain  History of Present Illness: Jane [name] is a 82 y.o. woman with PMH CAD s/p PCI/DES 08/2014, HFpEF (LVEF 55-60% in 08/2015), HTN, HLD, CKD3, GERD, CVA who presents for an episode of shortness of breath. She is a poor historian. She reports that today she was walking at home and "rushing to use the bathroom" when she developed shortness of breath. She sat down to rest and use her inhaler for her dyspnea with minimal improvement and then took a nap. When she awoke some shortness of breath remained so she called the clinic, and was advised to come to the ER. She called EMS and they brought her to the ED. She received 324 mg Aspirin PTA. She denies any chest pain, chest pressure, dizziness, cough, edema, fevers/chills, palpitations. She reports some intermittent pain in her left buttock that shoots down her left leg for which she takes Tylenol.   In the ED - vitals 98.2 F, HR 74, RR 16, BP 157/62, SpO2 98% on RA with benign exam. Per ER provider patient initially reported chest pain at home. CXR inconclusive for mild vascular congestion. EKG NSR, low voltage with early R wave progression. Labs remarkable for WBC 3, Hb 9.6, plt 85 all near baseline. CMP unremarkable. BNP elevated at 295 from 84 four months ago. Initial troponin 0.01. IMTS was contacted for admission.   Meds:  Current Meds  Medication Sig  . acetaminophen (TYLENOL) 325 MG tablet Take 650 mg by mouth every  6 (six) hours as needed for mild pain.  Marland Kitchen albuterol (PROAIR HFA) 108 (90 Base) MCG/ACT inhaler Inhale 1-2 puffs into the lungs every 4 (four) hours as needed for wheezing or shortness of breath.  Marland Kitchen aspirin EC 81 MG tablet Take 81 mg by mouth daily.  Marland Kitchen atorvastatin (LIPITOR) 40 MG tablet Take 1 tablet (40 mg total) by mouth daily at 6 PM.  . bisoprolol (ZEBETA) 5 MG tablet take 1/2 tablet by mouth once daily  . furosemide (LASIX) 40 MG tablet take 1 tablet by mouth once daily  . isosorbide mononitrate (IMDUR) 30 MG 24 hr tablet take 1 tablet by mouth once daily  . losartan (COZAAR) 50 MG tablet take 1 tablet by mouth once daily  . potassium chloride SA (K-DUR,KLOR-CON) 20 MEQ tablet Take 1 tablet (20 mEq total) by mouth daily.    Allergies: Allergies as of 05/20/2016  . (No Known Allergies)   Past Medical History:  Diagnosis Date  . Anemia   . Aortic insufficiency    mild  . Arthritis    "arms" (08/09/2015)  . CAD (coronary artery disease)    cath 08/09/2014 95% stenosis in prox to mid RCA s/p DES, 80-90% prox OM2, 50% distal LAD  . CHF (congestive heart failure) ([place])   . CKD (chronic kidney disease)  stage 3, GFR 30-59 ml/min   . Colon polyp    2009 colonoscopy, not retrieved for pathology  . Ectropion of left lower eyelid   . GERD (gastroesophageal reflux disease) 2009   EGD with benign gastric polyp too  . Hyperlipidemia   . Hypertension   . Stroke ([place]) 1975  . Vitamin D deficiency     Family History:  Family History  Problem Relation Age of Onset  . Stroke Mother   . Hypertension Mother   . Stroke Father   . Hypertension Father   . Diabetes Sister     Social History:  Social History   Social History  . Marital status: Widowed    Spouse name: N/A  . Number of children: N/A  . Years of education: N/A   Occupational History  . Not on file.   Social History Main Topics  . Smoking status: Former Smoker    Types: Cigarettes    Quit date: 07/02/1979  . Smokeless  tobacco: Former Systems developer  . Alcohol use No  . Drug use: No  . Sexual activity: No   Other Topics Concern  . Not on file   Social History Narrative   Ms. Bost lives alone in an apartment in [place]. Her nephew, and her son and his wife live nearby and visit and help often. She is independent in ADLs but depends on family for IADLs. She is ambulatory in her apartment without a walker but requires this when she leaves her apartment.     Review of Systems: A complete ROS was negative except as per HPI.   Physical Exam: Blood pressure (!) 149/65, pulse 72, temperature 98.6 F (37 C), temperature source Oral, resp. rate 15, height 5\' 4"  (1.626 m), weight 71 kg (156 lb 8.4 oz), SpO2 98 %.  General appearance: Elderly AA woman resting comfortably in ER bed, in no distress HENT: Normocephalic, atraumatic, moist mucous membranes, neck supple, JVD 2-3cm above sternal angle Eyes: PERRL, EOM inact, non-icteric, left lower eyelid erythematous and everted Cardiovascular: Regular rate and rhythm, no murmurs, rubs, gallops Respiratory: Mild bibasilar crackles bilaterally, normal work of breathing Abdomen: BS+, soft, non-tender, non-distended Extremities/back: Normal bulk and range of motion, no edema, 2+ peripheral pulses, ankles and feet with very poor hygiene, dry flaking skin, discolored hypertrophic nails, tender to palpation but no obvious ulcers, spine non-tender to palpation Skin: Warm, dry, intact Neuro: Alert and oriented, cranial nerves grossly intact Psych: Appropriate affect, clear speech, thoughts linear and goal-directed  EKG: NSR, low voltage with early R wave progression.  CXR: Inconclusive for mild vascular congestion  Assessment & Plan by Problem:  Jane [name] is a 82 y.o. woman with PMH CAD s/p PCI/DES 08/2014, HFpEF (LVEF 55-60% in 08/2015), HTN, HLD, CKD3, GERD, CVA who presented on 11/20 for an episode of shortness of breath and was admitted for chest pain rule out and  mild CHF exacerbation.  Active Problems:  Heart failure with preserved ejection fraction ([place]), acute on chronic, LVEF 55-60% in 08/2015, BNP elevated, CXR with mild vascular congestion, weight at 163 from dry baseline 155 lbs. - 1x IV Lasix 20mg   - TTE tomorrow - Telemetry - Strict I/Os and daily weights - Continue home Bisoprolol 2.5mg  daily - Continue home 24hr Imdur 30mg  daily - Continue home Losartan 50mg  daily  History of CAD, now with acute dyspnea on exertion, questionable chest pain, likely 2/2 AoC CHF but ACS is a concern given patient history of CAD s/p PCI/DES, HTN, HLD  and acute symptom onset - Trend troponins x3 - Repeat AM EKG - Continue home Asa 81 mg - Albuterol nebulizer Q2 PRN  Lower back pain, likely left lumbar radiculopathy, no tenderness to palpation, denies symptoms of urinary or bowel incontinence or saddle anesthesia, non-tender to palpation on exam - Tylenol PRN - May benefit from trial Gabapentin in future  Ectropion of left lower eyelid - Liquifilm tears PRN  CKD3,baseline Cr 1.1 - Follow AM BMP HTN - home Bisoprolol, Imdur, Losartan HLD - home Lipitor  FEN/GI: HH diet, replete electrolytes as needed  DVT ppx: SCDs  Code status: Full code  Dispo: Admit patient to Observation with expected length of stay less than 2 midnights.  Signed: Asencion Partridge, MD 05/20/2016, 9:03 PM  Pager: [phone]

## 2016-05-20 NOTE — ED Triage Notes (Signed)
Pt comes from home alone with c/o increased shortness of breath after going to the bathroom. Pt also c/o chest pain, resolved now. Pt given 324mg  ASA en route to facility.

## 2016-05-21 ENCOUNTER — Observation Stay (HOSPITAL_COMMUNITY): Payer: Medicare Other

## 2016-05-21 ENCOUNTER — Observation Stay (HOSPITAL_BASED_OUTPATIENT_CLINIC_OR_DEPARTMENT_OTHER): Payer: Medicare Other

## 2016-05-21 DIAGNOSIS — Z79899 Other long term (current) drug therapy: Secondary | ICD-10-CM

## 2016-05-21 DIAGNOSIS — Z7982 Long term (current) use of aspirin: Secondary | ICD-10-CM | POA: Diagnosis not present

## 2016-05-21 DIAGNOSIS — I130 Hypertensive heart and chronic kidney disease with heart failure and stage 1 through stage 4 chronic kidney disease, or unspecified chronic kidney disease: Secondary | ICD-10-CM | POA: Diagnosis not present

## 2016-05-21 DIAGNOSIS — I714 Abdominal aortic aneurysm, without rupture, unspecified: Secondary | ICD-10-CM | POA: Insufficient documentation

## 2016-05-21 DIAGNOSIS — M7062 Trochanteric bursitis, left hip: Secondary | ICD-10-CM

## 2016-05-21 DIAGNOSIS — I5032 Chronic diastolic (congestive) heart failure: Secondary | ICD-10-CM

## 2016-05-21 DIAGNOSIS — I2510 Atherosclerotic heart disease of native coronary artery without angina pectoris: Secondary | ICD-10-CM | POA: Diagnosis not present

## 2016-05-21 DIAGNOSIS — R0600 Dyspnea, unspecified: Secondary | ICD-10-CM | POA: Diagnosis not present

## 2016-05-21 DIAGNOSIS — D61818 Other pancytopenia: Secondary | ICD-10-CM | POA: Diagnosis not present

## 2016-05-21 DIAGNOSIS — N183 Chronic kidney disease, stage 3 (moderate): Secondary | ICD-10-CM

## 2016-05-21 DIAGNOSIS — Z8249 Family history of ischemic heart disease and other diseases of the circulatory system: Secondary | ICD-10-CM

## 2016-05-21 DIAGNOSIS — M545 Low back pain: Secondary | ICD-10-CM

## 2016-05-21 DIAGNOSIS — H02105 Unspecified ectropion of left lower eyelid: Secondary | ICD-10-CM

## 2016-05-21 DIAGNOSIS — Z87891 Personal history of nicotine dependence: Secondary | ICD-10-CM | POA: Diagnosis not present

## 2016-05-21 DIAGNOSIS — M5136 Other intervertebral disc degeneration, lumbar region: Secondary | ICD-10-CM | POA: Diagnosis not present

## 2016-05-21 DIAGNOSIS — Z833 Family history of diabetes mellitus: Secondary | ICD-10-CM

## 2016-05-21 DIAGNOSIS — Z955 Presence of coronary angioplasty implant and graft: Secondary | ICD-10-CM

## 2016-05-21 DIAGNOSIS — M1612 Unilateral primary osteoarthritis, left hip: Secondary | ICD-10-CM

## 2016-05-21 DIAGNOSIS — R0609 Other forms of dyspnea: Secondary | ICD-10-CM | POA: Diagnosis not present

## 2016-05-21 DIAGNOSIS — R0602 Shortness of breath: Secondary | ICD-10-CM | POA: Diagnosis not present

## 2016-05-21 DIAGNOSIS — M25552 Pain in left hip: Secondary | ICD-10-CM

## 2016-05-21 DIAGNOSIS — Z8673 Personal history of transient ischemic attack (TIA), and cerebral infarction without residual deficits: Secondary | ICD-10-CM | POA: Diagnosis not present

## 2016-05-21 DIAGNOSIS — Z9861 Coronary angioplasty status: Secondary | ICD-10-CM | POA: Diagnosis not present

## 2016-05-21 DIAGNOSIS — Z823 Family history of stroke: Secondary | ICD-10-CM

## 2016-05-21 DIAGNOSIS — I509 Heart failure, unspecified: Secondary | ICD-10-CM | POA: Diagnosis not present

## 2016-05-21 DIAGNOSIS — I5030 Unspecified diastolic (congestive) heart failure: Secondary | ICD-10-CM | POA: Diagnosis not present

## 2016-05-21 DIAGNOSIS — R0789 Other chest pain: Secondary | ICD-10-CM | POA: Diagnosis not present

## 2016-05-21 LAB — BASIC METABOLIC PANEL
ANION GAP: 7 (ref 5–15)
BUN: 19 mg/dL (ref 6–20)
CALCIUM: 9.1 mg/dL (ref 8.9–10.3)
CO2: 23 mmol/L (ref 22–32)
Chloride: 109 mmol/L (ref 101–111)
Creatinine, Ser: 1.12 mg/dL — ABNORMAL HIGH (ref 0.44–1.00)
GFR calc Af Amer: 50 mL/min — ABNORMAL LOW (ref >60)
GFR, EST NON AFRICAN AMERICAN: 44 mL/min — AB (ref >60)
GLUCOSE: 90 mg/dL (ref 65–99)
POTASSIUM: 3.3 mmol/L — AB (ref 3.5–5.1)
SODIUM: 139 mmol/L (ref 135–145)

## 2016-05-21 LAB — FOLATE RBC
FOLATE, RBC: 919 ng/mL (ref >498)
Folate, Hemolysate: 266.5 ng/mL
HEMATOCRIT: 29 % — AB (ref 34.0–46.6)

## 2016-05-21 LAB — ECHOCARDIOGRAM COMPLETE
HEIGHTINCHES: 64 in
WEIGHTICAEL: 2640 [oz_av]

## 2016-05-21 LAB — TROPONIN I

## 2016-05-21 MED ORDER — MELOXICAM 7.5 MG PO TABS
7.5000 mg | ORAL_TABLET | Freq: Every day | ORAL | Status: DC
  Administered 2016-05-21: 7.5 mg via ORAL
  Filled 2016-05-21: qty 1

## 2016-05-21 MED ORDER — PROPYLENE GLYCOL-GLYCERIN 1-0.3 % OP SOLN: 1.0000 [drp] | Freq: Every day | OPHTHALMIC | 1 refills | Status: DC | PRN

## 2016-05-21 MED ORDER — POTASSIUM CHLORIDE CRYS ER 20 MEQ PO TBCR
40.0000 meq | EXTENDED_RELEASE_TABLET | Freq: Once | ORAL | Status: AC
  Administered 2016-05-21: 40 meq via ORAL
  Filled 2016-05-21: qty 2

## 2016-05-21 MED ORDER — FUROSEMIDE 10 MG/ML IJ SOLN
40.0000 mg | Freq: Once | INTRAMUSCULAR | Status: AC
  Administered 2016-05-21: 40 mg via INTRAVENOUS
  Filled 2016-05-21: qty 4

## 2016-05-21 MED ORDER — POTASSIUM CHLORIDE CRYS ER 20 MEQ PO TBCR: 20.0000 meq | EXTENDED_RELEASE_TABLET | Freq: Once | ORAL | Status: DC

## 2016-05-21 MED ORDER — MELOXICAM 7.5 MG PO TABS: 7.5000 mg | ORAL_TABLET | Freq: Every day | ORAL | 1 refills | Status: DC

## 2016-05-21 NOTE — Care Management Note (Signed)
Case Management Note  Patient Details  Name: Jane [name] MRN: [id number] Date of Birth: Sep 27, 1928  Subjective/Objective:  Pt presented for CHF. Pt is from home alone. Pt lives at [facility] on the 10th floor. Elevator in use and pt uses a RW @ times. Per pt she weighs daily. Pt states her son and daughter n law check in periodically. Per pt she has an aide that comes in when needed.                   Action/Plan: CM did discuss with pt in regards to Home needs and if she felt that a [place] would be beneficial for disease/ medication management. Pt not on board at this time. CM will continue to monitor for additional needs.   Expected Discharge Date:                  Expected Discharge Plan:  [place]  In-House Referral:     Discharge planning Services  CM Consult  Post Acute Care Choice:    Choice offered to:     DME Arranged:    DME Agency:     HH Arranged:    Center Agency:     Status of Service:  In process, will continue to follow  If discussed at Long Length of Stay Meetings, dates discussed:    Additional Comments:  Bethena Roys, RN 05/21/2016, 2:12 PM

## 2016-05-21 NOTE — Discharge Summary (Signed)
Name: Jane [name] MRN: [id number] DOB: 04-06-1929 82 y.o. PCP: Milagros Loll, MD  Date of Admission: 05/20/2016 11:11 AM Date of Discharge: 05/21/2016 Attending Physician: Annia Belt, MD  Discharge Diagnosis: 1. Acute on chronic diastolic heart failure  Principal Problem:   Heart failure with preserved ejection fraction (HCC) Active Problems:   Trochanteric bursitis of left hip   Discharge Medications:   Medication List    TAKE these medications   acetaminophen 325 MG tablet Commonly known as:  TYLENOL Take 650 mg by mouth every 6 (six) hours as needed for mild pain.   albuterol 108 (90 Base) MCG/ACT inhaler Commonly known as:  PROAIR HFA Inhale 1-2 puffs into the lungs every 4 (four) hours as needed for wheezing or shortness of breath.   aspirin EC 81 MG tablet Take 81 mg by mouth daily.   atorvastatin 40 MG tablet Commonly known as:  LIPITOR Take 1 tablet (40 mg total) by mouth daily at 6 PM.   bisoprolol 5 MG tablet Commonly known as:  ZEBETA take 1/2 tablet by mouth once daily   furosemide 40 MG tablet Commonly known as:  LASIX take 1 tablet by mouth once daily   isosorbide mononitrate 30 MG 24 hr tablet Commonly known as:  IMDUR take 1 tablet by mouth once daily   losartan 50 MG tablet Commonly known as:  COZAAR take 1 tablet by mouth once daily   meloxicam 7.5 MG tablet Commonly known as:  MOBIC Take 1 tablet (7.5 mg total) by mouth daily.   potassium chloride SA 20 MEQ tablet Commonly known as:  K-DUR,KLOR-CON Take 1 tablet (20 mEq total) by mouth daily.   Propylene Glycol-Glycerin 1-0.3 % Soln Commonly known as:  ARTIFICIAL TEARS Apply 1-2 drops to eye daily as needed (dry eyes).       Disposition and follow-up:   Ms.Antoine J Marceaux was discharged from [facility] in Stable condition.  At the hospital follow up visit please address:  1.  Acute on chronic diastolic heart failure - had mild episode of  dyspnea at home, mild BNP elevation, required 2 days of IV diuretics, TTE stable with LVEF 55-60% and grade 1 diastolic dysfunction - assess for dyspnea on exertion, edema, weight gain, orthopnea - discharged on Lasix 59m daily  Left buttock/hip pain - likely secondary to arthritic changes vs trochanteric bursitis, consider ultrasound to assess for trochanteric bursitis which would be treatable with steroid injection, assess for pain control - discharged on Mobic 7.570mdaily and Tylenol PRN,  Hypokalemia - K 3.3 during hospital stay, please assess potassium level and need for repletion  Abdominal aortic aneurysm - 3.6cm diameter discovered on lumbar x-ray during hospital stay - assess for increasing size yearly  Pancytopenia - chronic slow decline in WBC (now 3.0), Hb (now 9.6), platelets (now 85) over previous months - assess for progression or clinical symptoms, may require bone marrow biopsy if persistent/progressive  2.  Labs / imaging needed at time of follow-up: CBC, BMP, ultrasound left hip  3.  Pending labs/ test needing follow-up: None  Follow-up Appointments: Follow-up Information    [facility] [place] [facility].   Why:  At 10:45 AM, please arrive 15 minutes early to check in. Contact information: [id number] by problem list: Principal Problem:   [facility] with preserved ejection fraction [name] Problems:  Trochanteric bursitis of left hip   1. Acute on chronic diastolic heart failure Jane [name] is a 82 y.o. woman with PMH CAD s/p PCI/DES 08/2014, HFpEF (LVEF 55-60%, Grade 1 DD in 08/2015), HTN, HLD, CKD3, GERD, CVA admitted on 11/20 following an episode of dyspnea on exertion at home and found to have equivocal vascular congestion on CXR, mild BNP elevation, and mild bibasilar crackles and mild JVD on exam. She was admitted and started on IV Lasix. Initial  concern for ACS assuaged by unchanged EKG and undetectable troponins x3. Patient reported symptom improvement the following day. TTE on 11/21 same as prior study revealing LVEF 55-60% and grade 1 diastolic dysfunction, elevated LV filling pressure, moderate LAE. Patient evaluated by physical therapy and ambulated well with no PT follow up recommended. Patient was deemed stable for discharge on 11/21 on daily Lasix 6m  with follow up visit scheduled for 12/5.   2. Left hip and buttock pain - reported left-sided aching lower back/buttock pain on admission that has been bothering her chronically. She reported the pain often shoots down her left leg without numbness, weakness, or bladder/bowel symptoms. She has been taking Tylenol at home that typically helps this pain. Her lumbar spine was non-tender to palpation, but she was tender over the left sacroiliac joint to greater trochanter space. Straight leg raise did not elicit pain nor did internal/external hip rotation. On 11/21, X-rays of her lumbar spine and left hip revealed extensive degenerative disc disease and facet joint disease throughout the lumbar spine, mild left hip osteoarthritis, and an incidental 3.6cm calcified abdominal aortic aneurysm. Her pain was managed with prn Tylenol while inpatient and she was started on Meloxicam 7.512mdaily. Her symptoms remained mild and she requested no PRN pain medications while hospitalized. Further workup deferred to outpatient setting.   Discharge Vitals:   BP (!) 144/57 (BP Location: Right Arm)   Pulse 67   Temp 98.6 F (37 C) (Oral)   Resp 15   Ht '5\' 4"'  (1.626 m)   Wt 74.8 kg (165 lb)   SpO2 97%   BMI 28.32 kg/m   Pertinent Labs, Studies, and Procedures:  CBC Latest Ref Rng & Units 05/20/2016 05/20/2016 02/04/2016  WBC 4.0 - 10.5 K/uL 3.0(L) - 3.7(L)  Hemoglobin 12.0 - 15.0 g/dL 9.6(L) - 9.0(L)  Hematocrit 34.0 - 46.6 % 29.0(L) 28.6(L) 27.4(L)  Platelets 150 - 400 K/uL 85(L) - 97(L)   BMP Latest  Ref Rng & Units 05/21/2016 05/20/2016 02/04/2016  Glucose 65 - 99 mg/dL 90 84 99  BUN 6 - 20 mg/dL 19 18 38(H)  Creatinine 0.44 - 1.00 mg/dL 1.12(H) 1.04(H) 1.27(H)  BUN/Creat Ratio 12 - 28 - - -  Sodium 135 - 145 mmol/L 139 143 141  Potassium 3.5 - 5.1 mmol/L 3.3(L) 3.8 4.0  Chloride 101 - 111 mmol/L 109 111 114(H)  CO2 22 - 32 mmol/L 23 21(L) 19(L)  Calcium 8.9 - 10.3 mg/dL 9.1 9.0 8.8(L)   Results for DOELEKTRA, WARTMANMRN [id number] of 05/22/2016 14:00  Ref. Range 05/20/2016 18:18 05/20/2016 23:58  Troponin I Latest Ref Range: <0.03 ng/mL <0.03 <0.03   TTE - 05/21/2016  Study Conclusions - Left ventricle: The cavity size was normal. Wall thickness was   normal. Systolic function was normal. The estimated ejection   fraction was in the range of 55% to 60%. Wall motion was normal;   there were no regional wall motion abnormalities. Doppler   parameters are consistent with abnormal  left ventricular   relaxation (grade 1 diastolic dysfunction). Doppler parameters   are consistent with high ventricular filling pressure. - Aortic valve: There was mild regurgitation. - Left atrium: The atrium was moderately dilated. - Pulmonary arteries: Systolic pressure was mildly increased.  Impressions: - Normal LV systolic function; grade 1 diastolic dysfunction with   elevated LV filling pressure; moderate LAE; mild AI; trace TR   with mildly elevated pulmonary pressure.   Dg Lumbar Spine 2-3 Views  Result Date: 05/21/2016 CLINICAL DATA:  Chronic lower back pain and posterior left hip area for a long time. History of arthritis. EXAM: LUMBAR SPINE - 2-3 VIEW COMPARISON:  Abdominal radiographs 09/09/2013 FINDINGS: There are 5 lumbar type vertebral bodies, assuming diminutive ribs at T12. There is disc space narrowing at all levels of the lumbar spine. Vacuum disc phenomenon noted at L3-L4. Extensive facet joint arthrosis throughout the lumbar spine. Prominent lateral osteophyte on the left at  L2-L3. No evidence of lumbar spine fracture. Extensive atherosclerotic calcification of the abdominal aorta with aneurysmal dilatation of the distal abdominal aorta measuring up to 3.6 cm AP diameter. IMPRESSION: Extensive degenerative disc disease and facet joint disease throughout the lumbar spine. No evidence of fracture. Distal abdominal aorta aneurysm. Measures approximately 3.6 cm AP diameter and contains extensive atherosclerotic calcification. These results will be called to the ordering clinician or representative by the Radiologist Assistant, and communication documented in the PACS or zVision Dashboard. Electronically Signed   By: Curlene Dolphin M.D.   On: 05/21/2016 11:22   Dg Chest Port 1 View  Result Date: 05/20/2016 CLINICAL DATA:  Increased shortness of breath and chest pain. EXAM: PORTABLE CHEST 1 VIEW COMPARISON:  02/04/2016 and 08/09/2015 FINDINGS: Again noted are prominent central vascular structures. Difficult to exclude some vascular congestion. Atherosclerotic calcifications at the aortic arch. Heart size is mildly enlarged and stable. Degenerative changes in the [facility] joints bilaterally. Patient is slightly rotated towards left. IMPRESSION: Prominent central vascular structures appear chronic but cannot exclude mild vascular congestion. Stable mild cardiomegaly. Electronically Signed   By: Markus Daft M.D.   On: 05/20/2016 12:15   Dg Hip Unilat With Pelvis 2-3 Views Left  Result Date: 05/21/2016 CLINICAL DATA:  82 year old with chronic left hip pain and low back pain. No known injuries. EXAM: DG HIP (WITH OR WITHOUT PELVIS) 2-3V LEFT COMPARISON:  None. FINDINGS: No evidence of acute, subacute or healed fractures. Bone mineral density relatively well preserved for age. Mild axial joint space narrowing. Include AP pelvis demonstrates symmetric mild axial joint space narrowing in the contralateral right hip. Sacroiliac joints intact with degenerative changes. Mild degenerative changes  involving the symphysis pubis. Severe degenerative changes involving the visualized lower lumbar spine. Bilateral iliofemoral atherosclerosis. Injection granulomata in the buttocks. IMPRESSION: Mild osteoarthritis involving the left hip. No acute or significant abnormalities otherwise involving the left hip. Electronically Signed   By: Evangeline Dakin M.D.   On: 05/21/2016 11:16    Discharge Instructions: Discharge Instructions    (HEART FAILURE PATIENTS) Call MD:  Anytime you have any of the following symptoms: 1) 3 pound weight gain in 24 hours or 5 pounds in 1 week 2) shortness of breath, with or without a dry hacking cough 3) swelling in the hands, feet or stomach 4) if you have to sleep on extra pillows at night in order to breathe.    Complete by:  As directed    Call MD for:  difficulty breathing, headache or visual disturbances    Complete  by:  As directed    Call MD for:  extreme fatigue    Complete by:  As directed    Call MD for:  persistant dizziness or light-headedness    Complete by:  As directed    Call MD for:  persistant nausea and vomiting    Complete by:  As directed    Call MD for:  severe uncontrolled pain    Complete by:  As directed    Diet - low sodium heart healthy    Complete by:  As directed    Discharge instructions    Complete by:  As directed    Please continue to take your medications as prescribed.  Please avoid eating foods with high salt content - also avoid drinking excess fluid.  Please weight yourself daily and call the clinic if your weight increases 3 lbs in one day 5 lbs over a week.  We have provided prescriptions for:   Mobic - anti-inflammatory daily medication for your back pain Artificial tears - for dry eyes  We called our clinic coordinator and you should receive a letter in the mail within a few days to get scheduled to see the foot doctors (Podiatrist).  If you develop new concerning symptoms please call the clinic to schedule an  acute visit or visit the nearest ER.   Increase activity slowly    Complete by:  As directed       Signed: Asencion Partridge, MD 05/22/2016, 3:07 PM   Pager: [phone]

## 2016-05-21 NOTE — Progress Notes (Signed)
Subjective: Jane [name] reports that her breathing has improved today but she reports her "left buttock" continues to ache and this kept her up last night. She continues to state "I have heart failure, that's my condition." She is 24 hr Net -200ccs, +2 lbs.  Objective: Vital signs in last 24 hours: Vitals:   05/20/16 1630 05/20/16 1653 05/20/16 2007 05/21/16 0513  BP: 136/55 (!) 159/57 (!) 149/65 (!) 144/57  Pulse: (!) 58 65 72 67  Resp: 15     Temp:  99.1 F (37.3 C) 98.6 F (37 C) 98.6 F (37 C)  TempSrc:  Oral Oral Oral  SpO2: 98% 100% 98% 97%  Weight:  71 kg (156 lb 8.4 oz)  74.8 kg (165 lb)  Height:  5\' 4"  (1.626 m)      Intake/Output Summary (Last 24 hours) at 05/21/16 [id number] Last data filed at 05/21/16 0053  Gross per 24 hour  Intake              222 ml  Output              450 ml  Net             -228 ml    Physical Exam General appearance: Elderly AA woman resting comfortably in bed, in no distress HENT: Normocephalic, atraumatic, moist mucous membranes, neck supple, JVD 2-3cm above sternal angle Eyes: PERRL, EOM inact, non-icteric, left lower eyelid erythematous and everted Cardiovascular: Regular rate and rhythm, no murmurs, rubs, gallops Respiratory: Clear to auscultation bilaterally, normal work of breathing Abdomen: BS+, soft, non-tender, non-distended Extremities/back: Normal bulk and range of motion, no edema, 2+ peripheral pulses, ankles and feet with dry flaking skin and hypertrophic nails, no pain with straight leg raise or internal/external hip rotation, non-tender to palpation along lumbar spine, tender to palpation along left SI-joint to greater trochanter Skin: Warm, dry, intact Neuro: Alert and oriented Psych: Appropriate affect  Labs / Imaging / Procedures: CBC Latest Ref Rng & Units 05/20/2016 02/04/2016 02/01/2016  WBC 4.0 - 10.5 K/uL 3.0(L) 3.7(L) 3.8  Hemoglobin 12.0 - 15.0 g/dL 9.6(L) 9.0(L) -  Hematocrit 36.0 - 46.0 % 28.6(L) 27.4(L) 29.8(L)    Platelets 150 - 400 K/uL 85(L) 97(L) 119(L)   BMP Latest Ref Rng & Units 05/21/2016 05/20/2016 02/04/2016  Glucose 65 - 99 mg/dL 90 84 99  BUN 6 - 20 mg/dL 19 18 38(H)  Creatinine 0.44 - 1.00 mg/dL 1.12(H) 1.04(H) 1.27(H)  BUN/Creat Ratio 12 - 28 - - -  Sodium 135 - 145 mmol/L 139 143 141  Potassium 3.5 - 5.1 mmol/L 3.3(L) 3.8 4.0  Chloride 101 - 111 mmol/L 109 111 114(H)  CO2 22 - 32 mmol/L 23 21(L) 19(L)  Calcium 8.9 - 10.3 mg/dL 9.1 9.0 8.8(L)   Dg Lumbar Spine 2-3 Views  Result Date: 05/21/2016 CLINICAL DATA:  Chronic lower back pain and posterior left hip area for a long time. History of arthritis. EXAM: LUMBAR SPINE - 2-3 VIEW COMPARISON:  Abdominal radiographs 09/09/2013 FINDINGS: There are 5 lumbar type vertebral bodies, assuming diminutive ribs at T12. There is disc space narrowing at all levels of the lumbar spine. Vacuum disc phenomenon noted at L3-L4. Extensive facet joint arthrosis throughout the lumbar spine. Prominent lateral osteophyte on the left at L2-L3. No evidence of lumbar spine fracture. Extensive atherosclerotic calcification of the abdominal aorta with aneurysmal dilatation of the distal abdominal aorta measuring up to 3.6 cm AP diameter. IMPRESSION: Extensive degenerative disc disease and facet joint  disease throughout the lumbar spine. No evidence of fracture. Distal abdominal aorta aneurysm. Measures approximately 3.6 cm AP diameter and contains extensive atherosclerotic calcification. These results will be called to the ordering clinician or representative by the Radiologist Assistant, and communication documented in the PACS or zVision Dashboard. Electronically Signed   By: Curlene Dolphin M.D.   On: 05/21/2016 11:22   Dg Hip Unilat With Pelvis 2-3 Views Left  Result Date: 05/21/2016 CLINICAL DATA:  82 year old with chronic left hip pain and low back pain. No known injuries. EXAM: DG HIP (WITH OR WITHOUT PELVIS) 2-3V LEFT COMPARISON:  None. FINDINGS: No evidence of  acute, subacute or healed fractures. Bone mineral density relatively well preserved for age. Mild axial joint space narrowing. Include AP pelvis demonstrates symmetric mild axial joint space narrowing in the contralateral right hip. Sacroiliac joints intact with degenerative changes. Mild degenerative changes involving the symphysis pubis. Severe degenerative changes involving the visualized lower lumbar spine. Bilateral iliofemoral atherosclerosis. Injection granulomata in the buttocks. IMPRESSION: Mild osteoarthritis involving the left hip. No acute or significant abnormalities otherwise involving the left hip. Electronically Signed   By: Evangeline Dakin M.D.   On: 05/21/2016 11:16    Assessment/Plan: Jane [name] is a 82 y.o. woman with PMH CAD s/p PCI/DES 08/2014, HFpEF (LVEF 55-60% in 08/2015), HTN, HLD, CKD3, GERD, CVA who presented on 11/20 for an episode of shortness of breath and was admitted for chest pain rule out and mild CHF exacerbation.  Active Problems:  Heart failure with preserved ejection fraction ([place]), acute on chronic, LVEF 55-60% in 08/2015, BNP elevated, CXR with mild vascular congestion, weight at 163 from dry baseline 155 lbs. - 1x IV Lasix 40 today - Follow TTE today - Telemetry - Strict I/Os and daily weights - Continue home Bisoprolol 2.5mg  daily - Continue home 24hr Imdur 30mg  daily - Continue home Losartan 50mg  daily  History of CAD, now with acute dyspnea on exertion, questionable chest pain, likely 2/2 AoC CHF but ACS is a concern given patient history of CAD s/p PCI/DES, HTN, HLD and acute symptom onset - Trend troponins x3 - Repeat AM EKG - Continue home Asa 81 mg - Albuterol nebulizer Q2 PRN  Lower back pain, likely osteoarthritic pain of SI joint of greater trochanteric bursitis, extensive degenerative changes of lumbar spine visible on XR today, no acute fracture. Negative straight leg raise test, tender to palpation along left SI joint and greater  trochanter on exam. She reports pain improves with Tylenol but then "it wears off" - XR L spine and left hip - Start Mobic 7.5 mg daily - Tylenol PRN - PT eval and treat - Consider outpatient ultrasound if trochanteric bursitis seems likely and pain not controlled with Tylenol/Mobic  Abdominal aortic aneurysm, asymptomatic infrarenal, 3.6 cm with atherosclerotic changes visible on L-spine XR today. Continue observation/conservative management - no repair indicated unless it becomes symptomatic or increases to > 5 cm - monitor outpatient with ultrasound  Ectropion of left lower eyelid - Liquifilm tears PRN  CKD3,baseline Cr 1.1 - Follow AM BMP HTN - home Bisoprolol, Imdur, Losartan HLD - home Lipitor  FEN/GI: HH diet, replete electrolytes as needed  DVT ppx: SCDs Dispo: Anticipated discharge today.   LOS: 0 days   Asencion Partridge, MD 05/21/2016, 6:37 AM Pager: [phone]

## 2016-05-21 NOTE — Progress Notes (Signed)
Radiology called xray report showing distal abd.  Aortic aneruism, Called and spoke to Dr. Wynetta Emery about Radiology report. Will continue to monitor.

## 2016-05-21 NOTE — Consult Note (Signed)
Cardiology Consult    Patient ID: Jane [name] MRN: [id number], DOB/AGE: Nov 22, 1928   Admit date: 05/20/2016 Date of Consult: 05/21/2016  Primary Physician: Jacques Earthly, MD Reason for Consult: CHF Primary Cardiologist: Dr. Martinique Requesting Provider: Dr. Wynetta Emery  Patient Profile    Jane [name] is a 82 year old female with a past medical history of chronic diastolic CHF, CAD s/p DES to RCA in Feb 2016, HTN, HLD, chronic kidney disease stage III, and CVA. She presented to the ED on 05/20/16 with SOB.   History of Present Illness    Jane [name] has a history of CAD, last heart cath was in Feb. 2016 in the setting of NSTEMI when her presenting symptom was dyspnea and a burning sensation in her throat . At that time she had 95% stenosis in her proximal to mid RCA that Dr. Martinique stented with a 2.5 x 24 mm Synergy stent. Her first OM was occluded proximally but with collaterals noted. The second OM had 80-90% stenosis proximally, both were treated medically. She completed a year of ASA and Brilinta, Brilinta was stopped in Feb 2017 and she continues on ASA.   Her last Echo was in Feb. 2017 and showed an EF of 55-60%. She is maintained at home on losartan, 40mg  daily of Lasix, 30mg  daily of isosorbide, and 2.5 mg bisoprolol daily. She was doing well and follows with the internal medicine clinic. She was last seen last month and was noted to be euvolemic.  She tells me that she feels SOB mostly when she has to walk fast to the bathroom. She denies orthopnea and PND. She says that she does not use salt, but eats Spam sandwiches and potted meats most days.   Past Medical History   Past Medical History:  Diagnosis Date  . Anemia   . Aortic insufficiency    mild  . Arthritis    "arms" (08/09/2015)  . CAD (coronary artery disease)    cath 08/09/2014 95% stenosis in prox to mid RCA s/p DES, 80-90% prox OM2, 50% distal LAD  . CHF (congestive heart failure) ([place])   . CKD (chronic kidney  disease) stage 3, GFR 30-59 ml/min   . Colon polyp    2009 colonoscopy, not retrieved for pathology  . Ectropion of left lower eyelid   . GERD (gastroesophageal reflux disease) 2009   EGD with benign gastric polyp too  . Hyperlipidemia   . Hypertension   . Stroke ([place]) 1975  . Vitamin D deficiency     Past Surgical History:  Procedure Laterality Date  . ABDOMINAL HYSTERECTOMY    . APPENDECTOMY    . ARTERY BIOPSY Left 12/16/2012   Procedure: BIOPSY TEMPORAL ARTERY;  Surgeon: Mal Misty, MD;  Location: [facility];  Service: Vascular;  Laterality: Left;  . CARDIAC CATHETERIZATION    . LEFT HEART CATHETERIZATION WITH CORONARY ANGIOGRAM N/A 08/09/2014   Procedure: LEFT HEART CATHETERIZATION WITH CORONARY ANGIOGRAM;  Surgeon: Peter M Martinique, MD;  Location[facility] C[facility] LAB;  Service: Cardiovascular;  Laterality: N/A;  . PERCUTANEOUS CORONARY STENT INTERVENTION (PCI-S)  08/09/2014   Procedure: PERCUTANEOUS CORONARY STENT INTERVENTION (PCI-S);  Surgeon: Peter M Martinique, MD;  Location: [facility] CATH LAB;  Service: Cardiovascular;;  . TONSILLECTOMY       Allergies  No Known Allergies  Inpatient Medications    . aspirin EC  81 mg Oral Daily  . atorvastatin  40 mg Oral q1800  . bisoprolol  2.5 mg Oral Daily  .  isosorbide mononitrate  30 mg Oral Daily  . losartan  50 mg Oral Daily  . sodium chloride flush  3 mL Intravenous Q12H    Family History    Family History  Problem Relation Age of Onset  . Stroke Mother   . Hypertension Mother   . Stroke Father   . Hypertension Father   . Diabetes Sister     Social History    Social History   Social History  . Marital status: Widowed    Spouse name: N/A  . Number of children: N/A  . Years of education: N/A   Occupational History  . Not on file.   Social History Main Topics  . Smoking status: Former Smoker    Types: Cigarettes    Quit date: 07/02/1979  . Smokeless tobacco: Former Systems developer  . Alcohol use No  . Drug use: No  . Sexual activity: No    Other Topics Concern  . Not on file   Social History Narrative   Jane [name] lives alone in an apartment in [place]. Her nephew, and her son and his wife live nearby and visit and help often. She is independent in ADLs but depends on family for IADLs. She is ambulatory in her apartment without a walker but requires this when she leaves her apartment.      Review of Systems    General:  No chills, fever, night sweats or weight changes.  Cardiovascular:  No chest pain, +dyspnea on exertion, edema, orthopnea, palpitations, paroxysmal nocturnal dyspnea. Dermatological: No rash, lesions/masses Respiratory: No cough, dyspnea Urologic: No hematuria, dysuria Abdominal:   No nausea, vomiting, diarrhea, bright red blood per rectum, melena, or hematemesis Neurologic:  No visual changes, wkns, changes in mental status. All other systems reviewed and are otherwise negative except as noted above.  Physical Exam    Blood pressure (!) 144/57, pulse 67, temperature 98.6 F (37 C), temperature source Oral, resp. rate 15, height 5\' 4"  (1.626 m), weight 165 lb (74.8 kg), SpO2 97 %.  General: Pleasant, NAD Psych: Normal affect. Neuro: Alert and oriented X 3. Moves all extremities spontaneously. HEENT: Normal  Neck: Supple without bruits, minimal JVD. Lungs:  Resp regular and unlabored, CTA. Heart: RRR no s3, s4, or murmurs. Abdomen: Soft, non-tender, non-distended, BS + x 4.  Extremities: No clubbing, cyanosis or edema. DP/PT/Radials 2+ and equal bilaterally.  Labs    Troponin [facility] of Care Test)  Recent Labs  05/20/16 1151  TROPIPOC 0.01    Recent Labs  05/20/16 1818 05/20/16 2358  TROPONINI <0.03 <0.03   Lab Results  Component Value Date   WBC 3.0 (L) 05/20/2016   HGB 9.6 (L) 05/20/2016   HCT 28.6 (L) 05/20/2016   MCV 94.7 05/20/2016   PLT 85 (L) 05/20/2016    Recent Labs Lab 05/20/16 1146 05/21/16 0449  NA 143 139  K 3.8 3.3*  CL 111 109  CO2 21* 23  BUN 18 19    CREATININE 1.04* 1.12*  CALCIUM 9.0 9.1  PROT 6.4*  --   BILITOT 1.4*  --   ALKPHOS 43  --   ALT 16  --   AST 26  --   GLUCOSE 84 90   Lab Results  Component Value Date   CHOL 193 06/01/2014   HDL 45 06/01/2014   LDLCALC 131 (H) 06/01/2014   TRIG 84 06/01/2014   Lab Results  Component Value Date   DDIMER 3.58 (H) 02/06/2013     Radiology Studies  Dg Chest Port 1 View  Result Date: 05/20/2016 CLINICAL DATA:  Increased shortness of breath and chest pain. EXAM: PORTABLE CHEST 1 VIEW COMPARISON:  02/04/2016 and 08/09/2015 FINDINGS: Again noted are prominent central vascular structures. Difficult to exclude some vascular congestion. Atherosclerotic calcifications at the aortic arch. Heart size is mildly enlarged and stable. Degenerative changes in the [facility] joints bilaterally. Patient is slightly rotated towards left. IMPRESSION: Prominent central vascular structures appear chronic but cannot exclude mild vascular congestion. Stable mild cardiomegaly. Electronically Signed   By: Markus Daft M.D.   On: 05/20/2016 12:15    EKG & Cardiac Imaging    EKG: NSR, low voltage QRS.   Echocardiogram: pending.  Assessment & Plan    1. Acute on chronic diastolic CHF: BNP slightly elevated 295. Chest x ray with some mild congestion. She denies orthopnea and PND. She and I talked about avoiding foods high in salt, such as Spam and Vienna sausages.   Would diurese with 40mg  Lasix BID today and switch to po tomorrow.   2. History of CAD s/p DES to RCA: Stable, chest pain free. Denies her anginal equivalent of dyspnea at rest and throat burning. Continue ASA.   3. Hypertensive heart disease: Remains hypertensive. Would increase her losartan to 75 mg daily.    Signed, Arbutus Leas, NP 05/21/2016, 11:03 AM Pager: [phone]  I saw evaluated patient today along with Kathlen Brunswick, NP-C. We reviewed the chart together and discussed the patient's presentation. Her findings, examination and  recommendations as noted above. To me the patient did not even remember her conversation with Junie Panning.  She could not remember her cardiologist, and did not remember her infarct related anginal symptoms that are described in the chart. She had significant symptoms indicated by prior records that she did not note at this time of presentation. She mostly noted exertional dyspnea going to the bathroom in the setting of having had some dietary indiscretion. I suspect that she has some mild acute on chronic diastolic heart failure from dietary discretion and would benefit from low-dose diuresis. I agree with switching her to oral Lasix tomorrow. No active angina from her coronary artery disease (described as a pressure in her chest and burning in her throat) -- plan was to treat the circumflex circumflex artery disease medically. During well until this last hospital stay. Plan I expect that she should probably be ready to discharge as early as tomorrow and simply follow-up with her primary cardiologist.   Would not pursue any ischemic evaluation at this time. She could potentially be having some microvascular diastolic dysfunction from hypertension. I agree with increasing afterload reduction by increasing losartan. Could also potentially consider increasing beta blocker dose.     Glenetta Hew, M.D., M.S. Interventional Cardiologist   Pager # [phone] Phone # [phone] [address]. [place], [place] [address]

## 2016-05-21 NOTE — Evaluation (Signed)
Physical Therapy Evaluation Patient Details Name: Jane [name] MRN: [id number] DOB: September 25, 1928 Today's Date: 05/21/2016   History of Present Illness  Patient is a 82 y/o female with hx of CAD s/p NSTEMI, s/p DES stent in RCA, CHF, COPD, HTN, vitamin D deficienct, anemia presents with SOB and found to have CHF exacerbation.  Clinical Impression  Patient presents with deconditioning, imbalance, generalized weakness and impaired mobility s/p above. Tolerated gait training with Min A-min guard assist depending if pt used RW or not. Reports being a furniture walker at home but uses RW for community ambulation. Pt has support from family for meals and driving. Will follow acutely to maximize independence and mobility prior to return home.    Follow Up Recommendations No PT follow up;Supervision - Intermittent    Equipment Recommendations  None recommended by PT    Recommendations for Other Services       Precautions / Restrictions Precautions Precautions: Fall Restrictions Weight Bearing Restrictions: No      Mobility  Bed Mobility               General bed mobility comments: Sitting EOB upon PT arrival.  Transfers Overall transfer level: Needs assistance Equipment used: Rolling walker (2 wheeled) Transfers: Sit to/from Stand Sit to Stand: Min guard         General transfer comment: Min guard for safety. Stood from [employer], from toilet x1.   Ambulation/Gait Ambulation/Gait assistance: Min assist;Min guard Ambulation Distance (Feet): 200 Feet Assistive device: Rolling walker (2 wheeled);None Gait Pattern/deviations: Step-through pattern;Decreased stride length;Trunk flexed;Staggering right;Staggering left;Narrow base of support Gait velocity: decreased   General Gait Details: Min guard assist when using RW for support, cues for forward gaze and for upright posture. Min A wihtout use of DME. 1 standing rest breaks. VSS.  Stairs            Wheelchair  Mobility    Modified Rankin (Stroke Patients Only)       Balance Overall balance assessment: Needs assistance Sitting-balance support: Feet supported;No upper extremity supported Sitting balance-Leahy Scale: Good     Standing balance support: During functional activity Standing balance-Leahy Scale: Fair Standing balance comment: Able to stand at sink and wash hands wihtout difficulty.                              Pertinent Vitals/Pain Pain Assessment: No/denies pain    Home Living Family/patient expects to be discharged to:: Private residence Living Arrangements: Alone Available Help at Discharge: Family;Available PRN/intermittently Type of Home: Apartment [facility]) Home Access: Elevator     Home Layout: One level Home Equipment: Environmental consultant - 2 wheels;Tub bench;Grab bars - tub/shower;Grab bars - toilet;Other (comment)      Prior Function Level of Independence: Needs assistance   Gait / Transfers Assistance Needed: Furniture walks in apartment, uses RW for community mobility  ADL's / Homemaking Assistance Needed: Pt reports she is independent with sponge bathing and dressing, but has difficulty reaching feet. Facility provides 3 meals/day and brings them to the room and her nephew drives her to appointments.        Hand Dominance   Dominant Hand: Right    Extremity/Trunk Assessment   Upper Extremity Assessment: Defer to OT evaluation           Lower Extremity Assessment: Generalized weakness         Communication   Communication: No difficulties  Cognition Arousal/Alertness: Awake/alert Behavior  During Therapy: WFL for tasks assessed/performed Overall Cognitive Status: Within Functional Limits for tasks assessed                      General Comments      Exercises     Assessment/Plan    PT Assessment Patient needs continued PT services  PT Problem List Decreased strength;Decreased mobility;Decreased balance;Decreased  activity tolerance;Cardiopulmonary status limiting activity;Decreased safety awareness          PT Treatment Interventions Gait training;Therapeutic exercise;Patient/family education;Therapeutic activities;Balance training;Functional mobility training    PT Goals (Current goals can be found in the Care Plan section)  Acute Rehab PT Goals Patient Stated Goal: to go home PT Goal Formulation: With patient Time For Goal Achievement: 06/04/16 Potential to Achieve Goals: Good    Frequency Min 3X/week   Barriers to discharge Decreased caregiver support      Co-evaluation               End of Session Equipment Utilized During Treatment: Gait belt Activity Tolerance: Patient tolerated treatment well Patient left: in bed;with call bell/phone within reach;with family/visitor present;with bed alarm set (sitting EOB.) Nurse Communication: Mobility status    Functional Assessment Tool Used: clinical judgment Functional Limitation: Mobility: Walking and moving around Mobility: Walking and Moving Around Current Status [id number]): At least 20 percent but less than 40 percent impaired, limited or restricted Mobility: Walking and Moving Around Goal Status [phone]): At least 1 percent but less than 20 percent impaired, limited or restricted    Time: [id number] PT Time Calculation (min) (ACUTE ONLY): 29 min   Charges:   PT Evaluation $PT Eval Moderate Complexity: 1 Procedure PT Treatments $Gait Training: 8-22 mins   PT G Codes:   PT G-Codes **NOT FOR INPATIENT CLASS** Functional Assessment Tool Used: clinical judgment Functional Limitation: Mobility: Walking and moving around Mobility: Walking and Moving Around Current Status [id number]): At least 20 percent but less than 40 percent impaired, limited or restricted Mobility: Walking and Moving Around Goal Status [phone]): At least 1 percent but less than 20 percent impaired, limited or restricted    [place] 05/21/2016, 4:13  PM Wray Kearns, [place], DPT [phone]

## 2016-05-21 NOTE — Care Management Obs Status (Signed)
[facility] NOTIFICATION   Patient Details  Name: Jane [name] MRN: [id number] Date of Birth: 08/06/1928   Medicare Observation Status Notification Given:  Yes    Bethena Roys, RN 05/21/2016, 2:09 PM

## 2016-05-21 NOTE — Progress Notes (Signed)
  Echocardiogram 2D Echocardiogram has been performed.  Jane [name] 05/21/2016, 5:24 PM

## 2016-05-22 DIAGNOSIS — M545 Low back pain, unspecified: Secondary | ICD-10-CM

## 2016-05-30 ENCOUNTER — Emergency Department (HOSPITAL_COMMUNITY)
Admission: EM | Admit: 2016-05-30 | Discharge: 2016-05-30 | Disposition: A | Discharge status: Home / Self Care | Payer: Medicare Other | Attending: Emergency Medicine | Admitting: Emergency Medicine

## 2016-05-30 ENCOUNTER — Encounter (HOSPITAL_COMMUNITY): Payer: Self-pay | Admitting: Emergency Medicine

## 2016-05-30 ENCOUNTER — Emergency Department (HOSPITAL_COMMUNITY): Payer: Medicare Other

## 2016-05-30 DIAGNOSIS — I509 Heart failure, unspecified: Secondary | ICD-10-CM | POA: Insufficient documentation

## 2016-05-30 DIAGNOSIS — Z7982 Long term (current) use of aspirin: Secondary | ICD-10-CM | POA: Insufficient documentation

## 2016-05-30 DIAGNOSIS — Z8673 Personal history of transient ischemic attack (TIA), and cerebral infarction without residual deficits: Secondary | ICD-10-CM | POA: Diagnosis not present

## 2016-05-30 DIAGNOSIS — Z79899 Other long term (current) drug therapy: Secondary | ICD-10-CM | POA: Diagnosis not present

## 2016-05-30 DIAGNOSIS — I130 Hypertensive heart and chronic kidney disease with heart failure and stage 1 through stage 4 chronic kidney disease, or unspecified chronic kidney disease: Secondary | ICD-10-CM | POA: Diagnosis not present

## 2016-05-30 DIAGNOSIS — R069 Unspecified abnormalities of breathing: Secondary | ICD-10-CM | POA: Diagnosis not present

## 2016-05-30 DIAGNOSIS — I2510 Atherosclerotic heart disease of native coronary artery without angina pectoris: Secondary | ICD-10-CM | POA: Diagnosis not present

## 2016-05-30 DIAGNOSIS — N183 Chronic kidney disease, stage 3 (moderate): Secondary | ICD-10-CM | POA: Insufficient documentation

## 2016-05-30 DIAGNOSIS — Z87891 Personal history of nicotine dependence: Secondary | ICD-10-CM | POA: Insufficient documentation

## 2016-05-30 DIAGNOSIS — R0602 Shortness of breath: Secondary | ICD-10-CM | POA: Insufficient documentation

## 2016-05-30 LAB — CBC WITH DIFFERENTIAL/PLATELET
BASOS PCT: 0 %
Basophils Absolute: 0 10*3/uL (ref 0.0–0.1)
EOS ABS: 0 10*3/uL (ref 0.0–0.7)
Eosinophils Relative: 0 %
HCT: 30.4 % — ABNORMAL LOW (ref 36.0–46.0)
Hemoglobin: 10.1 g/dL — ABNORMAL LOW (ref 12.0–15.0)
Lymphocytes Relative: 55 %
Lymphs Abs: 1.7 10*3/uL (ref 0.7–4.0)
MCH: 31.5 pg (ref 26.0–34.0)
MCHC: 33.2 g/dL (ref 30.0–36.0)
MCV: 94.7 fL (ref 78.0–100.0)
MONO ABS: 0.2 10*3/uL (ref 0.1–1.0)
MONOS PCT: 6 %
Neutro Abs: 1.2 10*3/uL — ABNORMAL LOW (ref 1.7–7.7)
Neutrophils Relative %: 38 %
PLATELETS: 109 10*3/uL — AB (ref 150–400)
RBC: 3.21 MIL/uL — ABNORMAL LOW (ref 3.87–5.11)
RDW: 13.1 % (ref 11.5–15.5)
WBC: 3.1 10*3/uL — ABNORMAL LOW (ref 4.0–10.5)

## 2016-05-30 LAB — BASIC METABOLIC PANEL
Anion gap: 8 (ref 5–15)
BUN: 37 mg/dL — AB (ref 6–20)
CO2: 22 mmol/L (ref 22–32)
CREATININE: 1.29 mg/dL — AB (ref 0.44–1.00)
Calcium: 8.8 mg/dL — ABNORMAL LOW (ref 8.9–10.3)
Chloride: 113 mmol/L — ABNORMAL HIGH (ref 101–111)
GFR calc Af Amer: 43 mL/min — ABNORMAL LOW (ref >60)
GFR, EST NON AFRICAN AMERICAN: 37 mL/min — AB (ref >60)
Glucose, Bld: 96 mg/dL (ref 65–99)
Potassium: 3.6 mmol/L (ref 3.5–5.1)
SODIUM: 143 mmol/L (ref 135–145)

## 2016-05-30 LAB — I-STAT TROPONIN, ED: TROPONIN I, POC: 0.01 ng/mL (ref 0.00–0.08)

## 2016-05-30 LAB — BRAIN NATRIURETIC PEPTIDE: B NATRIURETIC PEPTIDE 5: 182.5 pg/mL — AB (ref 0.0–100.0)

## 2016-05-30 MED ORDER — ALBUTEROL SULFATE (2.5 MG/3ML) 0.083% IN NEBU: 5.0000 mg | INHALATION_SOLUTION | Freq: Once | RESPIRATORY_TRACT | Status: DC

## 2016-05-30 NOTE — ED Notes (Signed)
Pt states that she could not find a ride home=--assisted pt in calling nephew-- Jane [name]-- he will come to get pt-- "when he gets out of the bed. " will give pt paper pants, and pull to hallway once dressed to wait for ride.

## 2016-05-30 NOTE — ED Triage Notes (Signed)
Pt called 911 due to SOB. Pt stated she was informed if she felt SOB to not stay at home. Pt had exertional SOB per EMS. Lung sounds clear in all fields. Pt does not appear to be in respiratory distress.

## 2016-05-30 NOTE — ED Notes (Signed)
Pt has a history of Afib and HTN.

## 2016-05-30 NOTE — ED Provider Notes (Signed)
[facility] DEPT Provider Note   CSN: [id number] Arrival date & time: 05/30/16  0458     History   Chief Complaint Chief Complaint  Patient presents with  . Shortness of Breath    HPI Jane [name] is a 82 y.o. female.With a past medical history of coronary artery disease, CHF, presenting today with shortness of breath. Patient states that she was sitting at home resting and developed shortness of breath. She was told by paramedics in the past not to stay at home when she has shortness of breath that she call 911. Her shortness of breath is worse on exertion. She denies any pain anywhere. She uses her albuterol inhaler at home as well. Currently she denies any shortness of breath and states she feels completely normal. There are no further complaints.  10 Systems reviewed and are negative for acute change except as noted in the HPI.    HPI  Past Medical History:  Diagnosis Date  . Anemia   . Aortic insufficiency    mild  . Arthritis    "arms" (08/09/2015)  . CAD (coronary artery disease)    cath 08/09/2014 95% stenosis in prox to mid RCA s/p DES, 80-90% prox OM2, 50% distal LAD  . CHF (congestive heart failure) ([place])   . CKD (chronic kidney disease) stage 3, GFR 30-59 ml/min   . Colon polyp    2009 colonoscopy, not retrieved for pathology  . Ectropion of left lower eyelid   . GERD (gastroesophageal reflux disease) 2009   EGD with benign gastric polyp too  . Hyperlipidemia   . Hypertension   . Stroke ([place]) 1975  . Vitamin D deficiency     Patient Active Problem List   Diagnosis Date Noted  . Low back pain   . Abdominal aortic aneurysm ([place]) 05/21/2016  . Trochanteric bursitis of left hip   . Other pancytopenia ([place]) 12/27/2015  . CKD (chronic kidney disease) stage 3, GFR 30-59 ml/min 03/31/2015  . Prediabetes 10/16/2014  . Status post insertion of drug-eluting stent into right coronary artery for coronary artery disease 08/07/2014  . Seborrheic keratoses  08/05/2014  . Physical deconditioning 05/31/2014  . Dementia 04/28/2014  . Heart failure with preserved ejection fraction ([place]) 12/27/2012  . Ectropion of left lower eyelid 12/17/2012  . Onychomycosis of toenail 09/18/2012  . PAD (peripheral artery disease) ([place]) 09/18/2012  . Preventative health care 09/18/2012  . Vitamin D deficiency 04/25/2008  . C V A / STROKE 10/29/2007  . Hyperlipidemia 09/24/2006  . Normocytic anemia 09/24/2006  . Essential hypertension 09/24/2006  . OSTEOPENIA 09/24/2006    Past Surgical History:  Procedure Laterality Date  . ABDOMINAL HYSTERECTOMY    . APPENDECTOMY    . ARTERY BIOPSY Left 12/16/2012   Procedure: BIOPSY TEMPORAL ARTERY;  Surgeon: Mal Misty, MD;  Location: [facility];  Service: Vascular;  Laterality: Left;  . CARDIAC CATHETERIZATION    . LEFT HEART CATHETERIZATION WITH CORONARY ANGIOGRAM N/A 08/09/2014   Procedure: LEFT HEART CATHETERIZATION WITH CORONARY ANGIOGRAM;  Surgeon: Peter M Martinique, MD;  Location: [facility] CATH LAB;  Service: Cardiovascular;  Laterality: N/A;  . PERCUTANEOUS CORONARY STENT INTERVENTION (PCI-S)  08/09/2014   Procedure: PERCUTANEOUS CORONARY STENT INTERVENTION (PCI-S);  Surgeon: Peter M Martinique, MD;  Location: [facility] CATH LAB;  Service: Cardiovascular;;  . TONSILLECTOMY      OB History    No data available       Home Medications    Prior to Admission medications   Medication  Sig Start Date End Date Taking? Authorizing Provider  acetaminophen (TYLENOL) 325 MG tablet Take 650 mg by mouth every 6 (six) hours as needed for mild pain.   Yes Historical Provider, MD  albuterol (PROAIR HFA) 108 (90 Base) MCG/ACT inhaler Inhale 1-2 puffs into the lungs every 4 (four) hours as needed for wheezing or shortness of breath. 03/12/16  Yes Milagros Loll, MD  aspirin EC 81 MG tablet Take 81 mg by mouth daily.   Yes Historical Provider, MD  atorvastatin (LIPITOR) 40 MG tablet Take 1 tablet (40 mg total) by mouth daily at 6 PM. 08/18/15  Yes  Lucious Groves, DO  bisoprolol (ZEBETA) 5 MG tablet take 1/2 tablet by mouth once daily 10/26/15  Yes Milagros Loll, MD  furosemide (LASIX) 40 MG tablet take 1 tablet by mouth once daily 04/24/16  Yes Milagros Loll, MD  isosorbide mononitrate (IMDUR) 30 MG 24 hr tablet take 1 tablet by mouth once daily 02/23/16  Yes Milagros Loll, MD  losartan (COZAAR) 50 MG tablet take 1 tablet by mouth once daily 02/23/16  Yes Milagros Loll, MD  meloxicam (MOBIC) 7.5 MG tablet Take 1 tablet (7.5 mg total) by mouth daily. 05/22/16  Yes Asencion Partridge, MD  potassium chloride SA (K-DUR,KLOR-CON) 20 MEQ tablet Take 1 tablet (20 mEq total) by mouth daily. 01/19/16  Yes Milagros Loll, MD  Propylene Glycol-Glycerin (ARTIFICIAL TEARS) 1-0.3 % SOLN Apply 1-2 drops to eye daily as needed (dry eyes). 05/21/16  Yes Asencion Partridge, MD    Family History Family History  Problem Relation Age of Onset  . Stroke Mother   . Hypertension Mother   . Stroke Father   . Hypertension Father   . Diabetes Sister     Social History Social History  Substance Use Topics  . Smoking status: Former Smoker    Types: Cigarettes    Quit date: 07/02/1979  . Smokeless tobacco: Former Systems developer  . Alcohol use No     Allergies   Patient has no known allergies.   Review of Systems Review of Systems   Physical Exam Updated Vital Signs BP (!) 138/54 (BP Location: Right Arm)   Pulse 76   Resp 22   SpO2 99%   Physical Exam  Constitutional: She is oriented to person, place, and time. She appears well-developed and well-nourished. No distress.  HENT:  Head: Normocephalic and atraumatic.  Nose: Nose normal.  Mouth/Throat: Oropharynx is clear and moist. No oropharyngeal exudate.  Eyes: Conjunctivae and EOM are normal. Pupils are equal, round, and reactive to light. No scleral icterus.  Neck: Normal range of motion. Neck supple. No JVD present. No tracheal deviation present. No thyromegaly present.  Cardiovascular: Normal  rate, regular rhythm and normal heart sounds.  Exam reveals no gallop and no friction rub.   No murmur heard. Pulmonary/Chest: Effort normal and breath sounds normal. No respiratory distress. She has no wheezes. She exhibits no tenderness.  Abdominal: Soft. Bowel sounds are normal. She exhibits no distension and no mass. There is no tenderness. There is no rebound and no guarding.  Musculoskeletal: Normal range of motion. She exhibits no edema or tenderness.  Lymphadenopathy:    She has no cervical adenopathy.  Neurological: She is alert and oriented to person, place, and time. No cranial nerve deficit. She exhibits normal muscle tone.  Skin: Skin is warm and dry. No rash noted. No erythema. No pallor.  Nursing note and vitals reviewed.    ED  Treatments / Results  Labs (all labs ordered are listed, but only abnormal results are displayed) Labs Reviewed  CBC WITH DIFFERENTIAL/PLATELET - Abnormal; Notable for the following:       Result Value   WBC 3.1 (*)    RBC 3.21 (*)    Hemoglobin 10.1 (*)    HCT 30.4 (*)    Platelets 109 (*)    Neutro Abs 1.2 (*)    All other components within normal limits  BASIC METABOLIC PANEL - Abnormal; Notable for the following:    Chloride 113 (*)    BUN 37 (*)    Creatinine, Ser 1.29 (*)    Calcium 8.8 (*)    GFR calc non Af Amer 37 (*)    GFR calc Af Amer 43 (*)    All other components within normal limits  BRAIN NATRIURETIC PEPTIDE  I-STAT TROPOININ, ED    EKG  EKG Interpretation  Date/Time:  Thursday May 30 2016 05:17:03 EST Ventricular Rate:  73 PR Interval:    QRS Duration: 99 QT Interval:  412 QTC Calculation: 454 R Axis:   12 Text Interpretation:  Sinus rhythm Probable left atrial enlargement Low voltage, precordial leads Abnormal R-wave progression, early transition No significant change since last tracing Confirmed by Glynn Octave [phone]) on 05/30/2016 5:54:32 AM       Radiology Dg Chest 2 View  Result Date:  05/30/2016 CLINICAL DATA:  Acute onset of shortness of breath. Initial encounter. EXAM: CHEST  2 VIEW COMPARISON:  Chest radiograph performed 05/20/2016 FINDINGS: The lungs are well-aerated. Vascular congestion is noted. Bibasilar airspace opacities may reflect mild interstitial edema. There is no evidence of pleural effusion or pneumothorax. The heart is borderline normal in size. No acute osseous abnormalities are seen. IMPRESSION: Vascular congestion noted. Bibasilar airspace opacities may reflect mild interstitial edema. Electronically Signed   By: Garald Balding M.D.   On: 05/30/2016 06:18    Procedures Procedures (including critical care time)  Medications Ordered in ED Medications - No data to display   Initial Impression / Assessment and Plan / ED Course  I have reviewed the triage vital signs and the nursing notes.  Pertinent labs & imaging results that were available during my care of the patient were reviewed by me and considered in my medical decision making (see chart for details).  Clinical Course     Patient presents to the emergency department for shortness of breath. I'm familiar with this patient as she visited the emergency department several times in the past. Currently she appears very well and in no acute distress. She certainly is not in any respiratory distress. We'll obtain laboratory studies, EKG, chest x-ray for evaluation. We'll continue to reassess.   6:26 AM troponin is negative.  CXR shows a mild amount of fluid in the lungs.  She continues to appear quite well and in no distress.  She denies SOB to me again.  EKG does not show any cardiac ischemia.  VS remain within her normal limits and she is safe for Dc.   Final Clinical Impressions(s) / ED Diagnoses   Final diagnoses:  None    New Prescriptions New Prescriptions   No medications on file     Everlene Balls, MD 05/30/16 [phone]

## 2016-06-04 ENCOUNTER — Encounter (INDEPENDENT_AMBULATORY_CARE_PROVIDER_SITE_OTHER): Payer: Self-pay

## 2016-06-04 ENCOUNTER — Ambulatory Visit (INDEPENDENT_AMBULATORY_CARE_PROVIDER_SITE_OTHER): Payer: Medicare Other | Admitting: Internal Medicine

## 2016-06-04 VITALS — BP 145/53 | HR 67 | Temp 97.9°F | Ht 64.0 in | Wt 167.3 lb

## 2016-06-04 DIAGNOSIS — Z7982 Long term (current) use of aspirin: Secondary | ICD-10-CM | POA: Diagnosis not present

## 2016-06-04 DIAGNOSIS — Z5189 Encounter for other specified aftercare: Secondary | ICD-10-CM | POA: Diagnosis present

## 2016-06-04 DIAGNOSIS — I5032 Chronic diastolic (congestive) heart failure: Secondary | ICD-10-CM | POA: Diagnosis not present

## 2016-06-04 DIAGNOSIS — M7062 Trochanteric bursitis, left hip: Secondary | ICD-10-CM

## 2016-06-04 DIAGNOSIS — Z79899 Other long term (current) drug therapy: Secondary | ICD-10-CM

## 2016-06-04 DIAGNOSIS — Z87891 Personal history of nicotine dependence: Secondary | ICD-10-CM

## 2016-06-04 DIAGNOSIS — I5030 Unspecified diastolic (congestive) heart failure: Secondary | ICD-10-CM

## 2016-06-04 NOTE — Patient Instructions (Addendum)
Please continue to take your medications as prescribed.  If you become short of breath and are nervous you can call us before calling 911:  If you have any questions or concerns, please call the clinic at [phone] or if it is the weekend or after hours, you may call [phone] and ask for the internal medicine resident on call.  We have set up a podiatrist appointment to get your feet taken care of on December 27th, 2017.

## 2016-06-04 NOTE — Assessment & Plan Note (Signed)
Minimal peripheral edema, lungs clear to ascultation. Experiences intermittent appropriate dyspnea on exertion at home that resolves with rest and thinks she needs to call 911 for every one of these episodes. Discussed this with patient and advised that she only need worry if the dyspnea is persistent, worsening, or coincides with chest pain or cough/fever. She has been compliant with her Lasix 40mg  daily and checks her weight daily.   Plan: - Continue current Lasix, Bisoprolol, Imdur, Losartan - Provided clinic / on-call number to call if she develops concerning symptoms as an alternative to 911

## 2016-06-04 NOTE — Progress Notes (Signed)
   CC: Hospital follow up for shortness of breath, CHF exacerbation  HPI:  [name] is a 82 y.o. female with PMHx detailed below presenting with hospital follow up visit today. She was recent hospitalized for CHF exacerbation due to shortness of breath at home. She was seen in the ED on 11/30 following another episode of shortness of breath with a negative work up. Her dyspnea is transient and only occurs when she "rushes" to do things at home.   See problem based assessment and plan below for additional details.  Past Medical History:  Diagnosis Date  . Anemia   . Aortic insufficiency    mild  . Arthritis    "arms" (08/09/2015)  . CAD (coronary artery disease)    cath 08/09/2014 95% stenosis in prox to mid RCA s/p DES, 80-90% prox OM2, 50% distal LAD  . CHF (congestive heart failure) ([place])   . CKD (chronic kidney disease) stage 3, GFR 30-59 ml/min   . Colon polyp    2009 colonoscopy, not retrieved for pathology  . Ectropion of left lower eyelid   . GERD (gastroesophageal reflux disease) 2009   EGD with benign gastric polyp too  . Hyperlipidemia   . Hypertension   . Stroke ([place]) 1975  . Vitamin D deficiency     Review of Systems: Review of Systems  Constitutional: Negative for chills, fever and malaise/fatigue.  Respiratory: Positive for shortness of breath (mild, intermittent, on exertion). Negative for cough and wheezing.   Cardiovascular: Positive for palpitations. Negative for chest pain, orthopnea and leg swelling.  Gastrointestinal: Negative for abdominal pain, nausea and vomiting.  Musculoskeletal: Positive for back pain (low back pain).     Physical Exam: Vitals:   06/04/16 1120  BP: (!) 145/53  Pulse: 67  Temp: 97.9 F (36.6 C)  TempSrc: Oral  SpO2: 100%  Weight: 167 lb 4.8 oz (75.9 kg)  Height: 5\' 4"  (1.626 m)   Body mass index is 28.72 kg/m.   GENERAL- Elderly woman sitting comfortably in exam room chair, alert, in no distress HEENT-  Atraumatic, moist mucous membranes CARDIAC- Regular rate and rhythm, no murmurs, rubs or gallops. RESP- Clear to ascultation bilaterally, no wheezing or crackles, normal work of breathing ABDOMEN- Soft, nontender, nondistended EXTREMITIES- Normal bulk and range of motion, trace edema to mid-calves bilaterally, 2+ peripheral pulses SKIN- Warm, dry, intact, without visible rash PSYCH- Appropriate affect, clear speech, thoughts linear and goal-directed  Assessment & Plan:   See encounters tab for problem based medical decision making.  Patient seen with Dr. Lynnae January

## 2016-06-04 NOTE — Assessment & Plan Note (Signed)
Continues to experience mild intermittent left lateral hip pain. Prescribed Mobic 7.5 mg daily at previous hospitalization and experiencing limited relief. Has stopped taking Tylenol prn due to concern over interaction with Mobic. Underwent XR of lumbar spine and left hip at that time and was found to have degeneratives changes of the lumbar spine predominantly. No significant tenderness to palpation today along lateral hip or lumbar spine.   Plan: - Advised to take Tylenol PRN in addition to the Mobic daily - May benefit from hip ultrasound to evaluate trochanteric bursa and potential need for steroid injection

## 2016-06-05 NOTE — Progress Notes (Signed)
Internal Medicine Clinic Attending  I saw and evaluated the patient.  I personally confirmed the key portions of the history and exam documented by Dr. Johnson and I reviewed pertinent patient test results.  The assessment, diagnosis, and plan were formulated together and I agree with the documentation in the resident's note.  

## 2016-06-05 NOTE — Progress Notes (Signed)
PT evaluation addendum Late entry for 05/21/16 Visit diagnosis:    05/21/16 1613  PT Assessment  PT Recommendation/Assessment Patient needs continued PT services  PT Problem List Decreased strength;Decreased mobility;Decreased balance;Decreased activity tolerance;Cardiopulmonary status limiting activity;Decreased safety awareness (visit diagnosis:  muscle weakness (generalized))  Barriers to Discharge Decreased caregiver support  06/05/2016 [place], [place]

## 2016-06-06 ENCOUNTER — Other Ambulatory Visit: Payer: Self-pay | Admitting: Pulmonary Disease

## 2016-06-19 ENCOUNTER — Other Ambulatory Visit: Payer: Self-pay

## 2016-06-19 NOTE — Patient Outreach (Signed)
   Unsuccessful attempt made to contact patient via telephone for assessment of community care coordination needs.  Will make another attempt later this mont

## 2016-06-21 ENCOUNTER — Other Ambulatory Visit: Payer: Self-pay

## 2016-06-21 NOTE — Patient Outreach (Addendum)
[facility]) Care Management  06/21/2016   Jane [name] Nov 23, 1928 [id number]  Subjective:  I am doing okay.  Objective:  Telephone encounter  Current Medications:  Current Outpatient Prescriptions  Medication Sig Dispense Refill  . acetaminophen (TYLENOL) 325 MG tablet Take 650 mg by mouth every 6 (six) hours as needed for mild pain.    Marland Kitchen albuterol (PROAIR HFA) 108 (90 Base) MCG/ACT inhaler Inhale 1-2 puffs into the lungs every 4 (four) hours as needed for wheezing or shortness of breath. 25.5 g 0  . aspirin EC 81 MG tablet Take 81 mg by mouth daily.    Marland Kitchen atorvastatin (LIPITOR) 40 MG tablet Take 1 tablet (40 mg total) by mouth daily at 6 PM. 30 tablet 11  . bisoprolol (ZEBETA) 5 MG tablet take 1/2 tablet by mouth once daily 90 tablet 1  . furosemide (LASIX) 40 MG tablet take 1 tablet by mouth once daily 90 tablet 3  . isosorbide mononitrate (IMDUR) 30 MG 24 hr tablet take 1 tablet by mouth once daily 90 tablet 2  . losartan (COZAAR) 50 MG tablet take 1 tablet by mouth once daily 30 tablet 6  . meloxicam (MOBIC) 7.5 MG tablet Take 1 tablet (7.5 mg total) by mouth daily. 30 tablet 1  . potassium chloride SA (K-DUR,KLOR-CON) 20 MEQ tablet Take 1 tablet (20 mEq total) by mouth daily. 90 tablet 3  . Propylene Glycol-Glycerin (ARTIFICIAL TEARS) 1-0.3 % SOLN Apply 1-2 drops to eye daily as needed (dry eyes). 15 mL 1   No current facility-administered medications for this visit.     Functional Status:  In your present state of health, do you have any difficulty performing the following activities: 06/21/2016 06/04/2016  Hearing? Y N  Vision? Y Y  Difficulty concentrating or making decisions? N N  Walking or climbing stairs? Y Y  Dressing or bathing? Y N  Doing errands, shopping? - Scientist, forensic and eating ? - -  Using the Toilet? - -  In the past six months, have you accidently leaked urine? - -  Do you have problems with loss of bowel control? - -   Managing your Medications? - -  Managing your Finances? - -  Housekeeping or managing your Housekeeping? - -  Some recent data might be hidden    Fall/Depression Screening: PHQ 2/9 Scores 06/21/2016 06/04/2016 04/12/2016 02/01/2016 01/04/2016 12/19/2015 11/20/2015  PHQ - 2 Score 0 0 0 0 0 0 0     Fall Risk  06/21/2016 06/04/2016 04/12/2016 02/01/2016 01/04/2016  Falls in the past year? _0   Number falls in past yr: 1 - 2 or more 1 1  Injury with Fall? _1   Risk Factor Category  High Fall Risk High Fall Risk High Fall Risk - -  Risk for fall due to : History of fall(s);Impaired balance/gait;Impaired mobility;Medication side effect Impaired balance/gait;Medication side effect Impaired balance/gait;Medication side effect Other (Comment) History of fall(s);Impaired balance/gait  Follow up Falls evaluation completed - Falls prevention discussed Falls prevention discussed Falls evaluation completed;Education provided;Falls prevention discussed   Assessment:  Patient has met her case management goals Patient denies need for additional case management at this time. Patient has [facility] contact information, understands she can self refer if need.  Plan:  Discharge from caseload, patient has met her case management goals

## 2016-06-26 ENCOUNTER — Ambulatory Visit (INDEPENDENT_AMBULATORY_CARE_PROVIDER_SITE_OTHER): Payer: Medicare Other | Admitting: Podiatry

## 2016-06-26 NOTE — Progress Notes (Signed)
Erroneous encounter no show  

## 2016-07-02 NOTE — Addendum Note (Signed)
Addended by: Hulan Fray on: 07/02/2016 08:04 PM   Modules accepted: Orders

## 2016-07-17 ENCOUNTER — Telehealth: Payer: Self-pay | Admitting: Internal Medicine

## 2016-07-17 NOTE — Telephone Encounter (Signed)
   Reason for call:   I received a call from [name] at  PM indicating below.   Pertinent Data:   Feel SOB- took inhaler   Called daughter in law  Denies chest pain - but had some left lateral shoulder/breast pain. Denies any cough.   Using inhaler   Using fluid pill- lasix daily.   Denies cough, or increased SOB.    Assessment / Plan / Recommendations:   Im unclear why she called. She asked if she can take her 6 PM atorvastatin at 7 PM right now. I told her it is perfectly fine.   Patient knows she has heart failure but not sure if symptoms are exacerbated right now  Told her to call EMS if she feels a doubt if her symptoms worsened and they will examine her.   As always, pt is advised that if symptoms worsen or new symptoms arise, they should go to an urgent care facility or to to ER for further evaluation.   Burgess Estelle, MD   07/17/2016, 6:53 PM

## 2016-07-18 ENCOUNTER — Encounter (HOSPITAL_COMMUNITY): Payer: Self-pay | Admitting: *Deleted

## 2016-07-18 ENCOUNTER — Emergency Department (HOSPITAL_COMMUNITY)
Admission: EM | Admit: 2016-07-18 | Discharge: 2016-07-18 | Disposition: A | Discharge status: Home / Self Care | Payer: Medicare Other | Attending: Emergency Medicine | Admitting: Emergency Medicine

## 2016-07-18 ENCOUNTER — Emergency Department (HOSPITAL_COMMUNITY): Payer: Medicare Other

## 2016-07-18 DIAGNOSIS — Z7982 Long term (current) use of aspirin: Secondary | ICD-10-CM | POA: Insufficient documentation

## 2016-07-18 DIAGNOSIS — N183 Chronic kidney disease, stage 3 (moderate): Secondary | ICD-10-CM | POA: Diagnosis not present

## 2016-07-18 DIAGNOSIS — I2510 Atherosclerotic heart disease of native coronary artery without angina pectoris: Secondary | ICD-10-CM | POA: Diagnosis not present

## 2016-07-18 DIAGNOSIS — I5040 Unspecified combined systolic (congestive) and diastolic (congestive) heart failure: Secondary | ICD-10-CM | POA: Diagnosis not present

## 2016-07-18 DIAGNOSIS — Z87891 Personal history of nicotine dependence: Secondary | ICD-10-CM | POA: Insufficient documentation

## 2016-07-18 DIAGNOSIS — Z955 Presence of coronary angioplasty implant and graft: Secondary | ICD-10-CM | POA: Diagnosis not present

## 2016-07-18 DIAGNOSIS — R531 Weakness: Secondary | ICD-10-CM | POA: Diagnosis not present

## 2016-07-18 DIAGNOSIS — I110 Hypertensive heart disease with heart failure: Secondary | ICD-10-CM | POA: Diagnosis not present

## 2016-07-18 DIAGNOSIS — R0602 Shortness of breath: Secondary | ICD-10-CM | POA: Diagnosis not present

## 2016-07-18 DIAGNOSIS — I509 Heart failure, unspecified: Secondary | ICD-10-CM | POA: Insufficient documentation

## 2016-07-18 DIAGNOSIS — Z8673 Personal history of transient ischemic attack (TIA), and cerebral infarction without residual deficits: Secondary | ICD-10-CM | POA: Insufficient documentation

## 2016-07-18 DIAGNOSIS — R069 Unspecified abnormalities of breathing: Secondary | ICD-10-CM | POA: Diagnosis not present

## 2016-07-18 DIAGNOSIS — I130 Hypertensive heart and chronic kidney disease with heart failure and stage 1 through stage 4 chronic kidney disease, or unspecified chronic kidney disease: Secondary | ICD-10-CM | POA: Insufficient documentation

## 2016-07-18 LAB — CBC WITH DIFFERENTIAL/PLATELET
Basophils Absolute: 0 10*3/uL (ref 0.0–0.1)
Basophils Relative: 0 %
Eosinophils Absolute: 0 10*3/uL (ref 0.0–0.7)
Eosinophils Relative: 0 %
HCT: 31.5 % — ABNORMAL LOW (ref 36.0–46.0)
HEMOGLOBIN: 10.4 g/dL — AB (ref 12.0–15.0)
LYMPHS ABS: 1.5 10*3/uL (ref 0.7–4.0)
LYMPHS PCT: 49 %
MCH: 31.5 pg (ref 26.0–34.0)
MCHC: 33 g/dL (ref 30.0–36.0)
MCV: 95.5 fL (ref 78.0–100.0)
Monocytes Absolute: 0.2 10*3/uL (ref 0.1–1.0)
Monocytes Relative: 7 %
NEUTROS ABS: 1.3 10*3/uL — AB (ref 1.7–7.7)
NEUTROS PCT: 44 %
PLATELETS: 101 10*3/uL — AB (ref 150–400)
RBC: 3.3 MIL/uL — AB (ref 3.87–5.11)
RDW: 13.4 % (ref 11.5–15.5)
WBC: 3 10*3/uL — AB (ref 4.0–10.5)

## 2016-07-18 LAB — COMPREHENSIVE METABOLIC PANEL
ALK PHOS: 69 U/L (ref 38–126)
ALT: 14 U/L (ref 14–54)
AST: 20 U/L (ref 15–41)
Albumin: 4.4 g/dL (ref 3.5–5.0)
Anion gap: 8 (ref 5–15)
BUN: 19 mg/dL (ref 6–20)
CALCIUM: 9.2 mg/dL (ref 8.9–10.3)
CHLORIDE: 115 mmol/L — AB (ref 101–111)
CO2: 20 mmol/L — AB (ref 22–32)
CREATININE: 1.07 mg/dL — AB (ref 0.44–1.00)
GFR, EST AFRICAN AMERICAN: 53 mL/min — AB (ref >60)
GFR, EST NON AFRICAN AMERICAN: 46 mL/min — AB (ref >60)
Glucose, Bld: 102 mg/dL — ABNORMAL HIGH (ref 65–99)
Potassium: 4 mmol/L (ref 3.5–5.1)
Sodium: 143 mmol/L (ref 135–145)
Total Bilirubin: 0.9 mg/dL (ref 0.3–1.2)
Total Protein: 6.8 g/dL (ref 6.5–8.1)

## 2016-07-18 LAB — TROPONIN I

## 2016-07-18 LAB — BRAIN NATRIURETIC PEPTIDE: B NATRIURETIC PEPTIDE 5: 202.4 pg/mL — AB (ref 0.0–100.0)

## 2016-07-18 MED ORDER — FUROSEMIDE 10 MG/ML IJ SOLN
40.0000 mg | Freq: Once | INTRAMUSCULAR | Status: AC
  Administered 2016-07-18: 40 mg via INTRAVENOUS
  Filled 2016-07-18: qty 4

## 2016-07-18 MED ORDER — IPRATROPIUM-ALBUTEROL 0.5-2.5 (3) MG/3ML IN SOLN
3.0000 mL | Freq: Once | RESPIRATORY_TRACT | Status: AC
Start: 2016-07-18 — End: 2016-07-18
  Administered 2016-07-18: 3 mL via RESPIRATORY_TRACT
  Filled 2016-07-18: qty 3

## 2016-07-18 NOTE — ED Notes (Signed)
Assisted up to [facility].

## 2016-07-18 NOTE — ED Notes (Signed)
[facility] Called to transport patient home

## 2016-07-18 NOTE — ED Triage Notes (Signed)
Pt to ED from home by EMS c/o SOB. Pt woke up, was in a hurry to get to the restroom, and got short of breath. Pt reports hx of CHF and has been told to come to the ED to get oxygen. Respirations even and unlabored. Denies pain at this time. Room air sats 98%, lungs clear per EMS

## 2016-07-18 NOTE — ED Notes (Signed)
[facility] called  

## 2016-07-18 NOTE — ED Notes (Signed)
Pt placed on the bedpan to urinate

## 2016-07-18 NOTE — ED Notes (Signed)
[facility] here to take patient back home , breakfast sent with patient

## 2016-07-18 NOTE — ED Notes (Signed)
Ambulated in room with one assist , patient did great and states she feels better. States she has a walker at home however her apartment is small so she only uses it when she goes outside. Breakfast  Tray ordered.

## 2016-07-18 NOTE — ED Provider Notes (Signed)
[facility] DEPT Provider Note   CSN: [id number] Arrival date & time: 07/18/16  0246  By signing my name below, I, Evelene Croon, attest that this documentation has been prepared under the direction and in the presence of Merrily Pew, MD . Electronically Signed: Evelene Croon, Scribe. 07/18/2016. 3:05 AM.  History   Chief Complaint Chief Complaint  Patient presents with  . Shortness of Breath    The history is provided by the patient and the EMS personnel. No language interpreter was used.     HPI Comments:  Jane [name] is a 83 y.o. female with a history of CHF, who presents to the Emergency Department via complaining of an episode of SOB that began a few hours PTA.  She states symptom began while walking to the bathroom. She used her inhaler without relief. EMS received a call for SOB, upon arrival to pt, pt showed no evidence of dyspnea. Her SaO2 was 98% on RA, her respiratory rate was 16, and heart rate and BP were normal en route. Pt lives alone. Pt denies pain at this time. No recent fever, leg swelling, cough, or orthopnea.     Past Medical History:  Diagnosis Date  . Anemia   . Aortic insufficiency    mild  . Arthritis    "arms" (08/09/2015)  . CAD (coronary artery disease)    cath 08/09/2014 95% stenosis in prox to mid RCA s/p DES, 80-90% prox OM2, 50% distal LAD  . CHF (congestive heart failure) ([place])   . CKD (chronic kidney disease) stage 3, GFR 30-59 ml/min   . Colon polyp    2009 colonoscopy, not retrieved for pathology  . Ectropion of left lower eyelid   . GERD (gastroesophageal reflux disease) 2009   EGD with benign gastric polyp too  . Hyperlipidemia   . Hypertension   . Stroke ([place]) 1975  . Vitamin D deficiency     Patient Active Problem List   Diagnosis Date Noted  . Low back pain   . Abdominal aortic aneurysm ([place]) 05/21/2016  . Trochanteric bursitis of left hip   . Other pancytopenia ([place]) 12/27/2015  . CKD (chronic kidney disease) stage 3, GFR  30-59 ml/min 03/31/2015  . Prediabetes 10/16/2014  . Status post insertion of drug-eluting stent into right coronary artery for coronary artery disease 08/07/2014  . Seborrheic keratoses 08/05/2014  . Physical deconditioning 05/31/2014  . Dementia 04/28/2014  . Heart failure with preserved ejection fraction ([place]) 12/27/2012  . Ectropion of left lower eyelid 12/17/2012  . Onychomycosis of toenail 09/18/2012  . PAD (peripheral artery disease) ([place]) 09/18/2012  . Preventative health care 09/18/2012  . Vitamin D deficiency 04/25/2008  . C V A / STROKE 10/29/2007  . Hyperlipidemia 09/24/2006  . Normocytic anemia 09/24/2006  . Essential hypertension 09/24/2006  . OSTEOPENIA 09/24/2006    Past Surgical History:  Procedure Laterality Date  . ABDOMINAL HYSTERECTOMY    . APPENDECTOMY    . ARTERY BIOPSY Left 12/16/2012   Procedure: BIOPSY TEMPORAL ARTERY;  Surgeon: Mal Misty, MD;  Location: [facility];  Service: Vascular;  Laterality: Left;  . CARDIAC CATHETERIZATION    . LEFT HEART CATHETERIZATION WITH CORONARY ANGIOGRAM N/A 08/09/2014   Procedure: LEFT HEART CATHETERIZATION WITH CORONARY ANGIOGRAM;  Surgeon: Peter M Martinique, MD;  Location[facility] CATH LAB;  Service: Cardiovascular;  Laterality: N/A;  . PERCUTANEOUS CORONARY STENT INTERVENTION (PCI-S)  08/09/2014   Procedure: PERCUTANEOUS CORONARY STENT INTERVENTION (PCI-S);  Surgeon: Peter M Martinique, MD;  Location: [facility]  CATH LAB;  Service: Cardiovascular;;  . TONSILLECTOMY      OB History    No data available       Home Medications    Prior to Admission medications   Medication Sig Start Date End Date Taking? Authorizing Provider  acetaminophen (TYLENOL) 325 MG tablet Take 650 mg by mouth every 6 (six) hours as needed for mild pain.   Yes Historical Provider, MD  albuterol (PROAIR HFA) 108 (90 Base) MCG/ACT inhaler Inhale 1-2 puffs into the lungs every 4 (four) hours as needed for wheezing or shortness of breath. 03/12/16  Yes Milagros Loll,  MD  aspirin EC 81 MG tablet Take 81 mg by mouth daily.   Yes Historical Provider, MD  atorvastatin (LIPITOR) 40 MG tablet Take 1 tablet (40 mg total) by mouth daily at 6 PM. 08/18/15  Yes Lucious Groves, DO  bisoprolol (ZEBETA) 5 MG tablet take 1/2 tablet by mouth once daily 10/26/15  Yes Milagros Loll, MD  furosemide (LASIX) 40 MG tablet take 1 tablet by mouth once daily 04/24/16  Yes Milagros Loll, MD  isosorbide mononitrate (IMDUR) 30 MG 24 hr tablet take 1 tablet by mouth once daily 02/23/16  Yes Milagros Loll, MD  losartan (COZAAR) 50 MG tablet take 1 tablet by mouth once daily 06/07/16  Yes Milagros Loll, MD  meloxicam (MOBIC) 7.5 MG tablet Take 1 tablet (7.5 mg total) by mouth daily. 05/22/16  Yes Asencion Partridge, MD  potassium chloride SA (K-DUR,KLOR-CON) 20 MEQ tablet Take 1 tablet (20 mEq total) by mouth daily. 01/19/16  Yes Milagros Loll, MD  Propylene Glycol-Glycerin (ARTIFICIAL TEARS) 1-0.3 % SOLN Apply 1-2 drops to eye daily as needed (dry eyes). 05/21/16  Yes Asencion Partridge, MD    Family History Family History  Problem Relation Age of Onset  . Stroke Mother   . Hypertension Mother   . Stroke Father   . Hypertension Father   . Diabetes Sister     Social History Social History  Substance Use Topics  . Smoking status: Former Smoker    Types: Cigarettes    Quit date: 07/02/1979  . Smokeless tobacco: Former Systems developer  . Alcohol use No     Allergies   Patient has no known allergies.   Review of Systems Review of Systems  Constitutional: Negative for fever.  Respiratory: Positive for shortness of breath. Negative for cough.   Cardiovascular: Negative for chest pain and leg swelling.  Gastrointestinal: Negative for abdominal pain.  Neurological: Negative for headaches.  All other systems reviewed and are negative.    Physical Exam Updated Vital Signs BP (!) 131/44 (BP Location: Right Arm)   Pulse 71   Temp 97.4 F (36.3 C) (Oral)   Resp 18   SpO2 100%    Physical Exam  Constitutional: She is oriented to person, place, and time. She appears well-developed and well-nourished. No distress.  HENT:  Head: Normocephalic and atraumatic.  Eyes: Conjunctivae are normal.  Cardiovascular: Normal rate and regular rhythm.   Pulmonary/Chest: Effort normal and breath sounds normal. No respiratory distress.  Abdominal: Soft. She exhibits no distension. There is no tenderness.  Musculoskeletal: She exhibits no edema.  No obvious LE edema   Neurological: She is alert and oriented to person, place, and time.  Skin: Skin is warm and dry.  Skin discoloration to LLE that is tender to touch.  Psychiatric: She has a normal mood and affect.  Nursing note and vitals reviewed.  ED Treatments / Results  DIAGNOSTIC STUDIES:  Oxygen Saturation is 100% on RA, normal by my interpretation.    COORDINATION OF CARE:  2:52 AM Discussed treatment plan with pt at bedside and pt agreed to plan.  Labs (all labs ordered are listed, but only abnormal results are displayed) Labs Reviewed  CBC WITH DIFFERENTIAL/PLATELET - Abnormal; Notable for the following:       Result Value   WBC 3.0 (*)    RBC 3.30 (*)    Hemoglobin 10.4 (*)    HCT 31.5 (*)    Platelets 101 (*)    Neutro Abs 1.3 (*)    All other components within normal limits  COMPREHENSIVE METABOLIC PANEL - Abnormal; Notable for the following:    Chloride 115 (*)    CO2 20 (*)    Glucose, Bld 102 (*)    Creatinine, Ser 1.07 (*)    GFR calc non Af Amer 46 (*)    GFR calc Af Amer 53 (*)    All other components within normal limits  BRAIN NATRIURETIC PEPTIDE - Abnormal; Notable for the following:    B Natriuretic Peptide 202.4 (*)    All other components within normal limits  TROPONIN I    EKG  EKG Interpretation  Date/Time:  Thursday July 18 2016 02:52:30 EST Ventricular Rate:  74 PR Interval:    QRS Duration: 123 QT Interval:  406 QTC Calculation: 448 R Axis:   -2 Text  Interpretation:  Sinus rhythm Ventricular premature complex Probable left atrial enlargement Nonspecific intraventricular conduction delay Minimal ST depression aside from artifact, no acute changes Confirmed by Libertas [place] MD, Sheldon Sem [phone]) on 07/18/2016 3:25:15 AM       Radiology Dg Chest 2 View  Result Date: 07/18/2016 CLINICAL DATA:  Shortness of breath EXAM: CHEST  2 VIEW COMPARISON:  05/30/2016, 08/09/2015 FINDINGS: Mild hyperinflation. There is cardiomegaly with central vascular congestion but no overt edema. No consolidation or effusion. Atherosclerosis. No pneumothorax. IMPRESSION: 1. Cardiomegaly with central vascular congestion but no overt edema or effusion 2. No focal consolidation Electronically Signed   By: Donavan Foil M.D.   On: 07/18/2016 03:33    Procedures Procedures (including critical care time)  Medications Ordered in ED Medications  ipratropium-albuterol (DUONEB) 0.5-2.5 (3) MG/3ML nebulizer solution 3 mL (3 mLs Nebulization Given 07/18/16 0353)  furosemide (LASIX) injection 40 mg (40 mg Intravenous Given 07/18/16 0418)     Initial Impression / Assessment and Plan / ED Course  I have reviewed the triage vital signs and the nursing notes.  Pertinent labs & imaging results that were available during my care of the patient were reviewed by me and considered in my medical decision making (see chart for details).     Likely a mild chf exacerbation. Lasix given. Diuresis ensued. Will ambulate to ensure improvement in symptoms. VS persistently normal wile in ED. No respiratory distress. Stable for dc w/ pcp follow up.     Final Clinical Impressions(s) / ED Diagnoses   Final diagnoses:  Acute on chronic congestive heart failure, unspecified congestive heart failure type [facility])    New Prescriptions Discharge Medication List as of 07/18/2016  6:16 AM     I personally performed the services described in this documentation, which was scribed in my presence. The recorded  information has been reviewed and is accurate.     Merrily Pew, MD 07/18/16 1225

## 2016-07-23 IMAGING — CR DG CHEST 2V
2 series · 2 of 2 positions shown · non-contrast
Comparison: Chest radiograph 06/12/2014

CLINICAL DATA: Short of breath, congestive heart failure

EXAM:
CHEST  2 VIEW

[w chest lat]
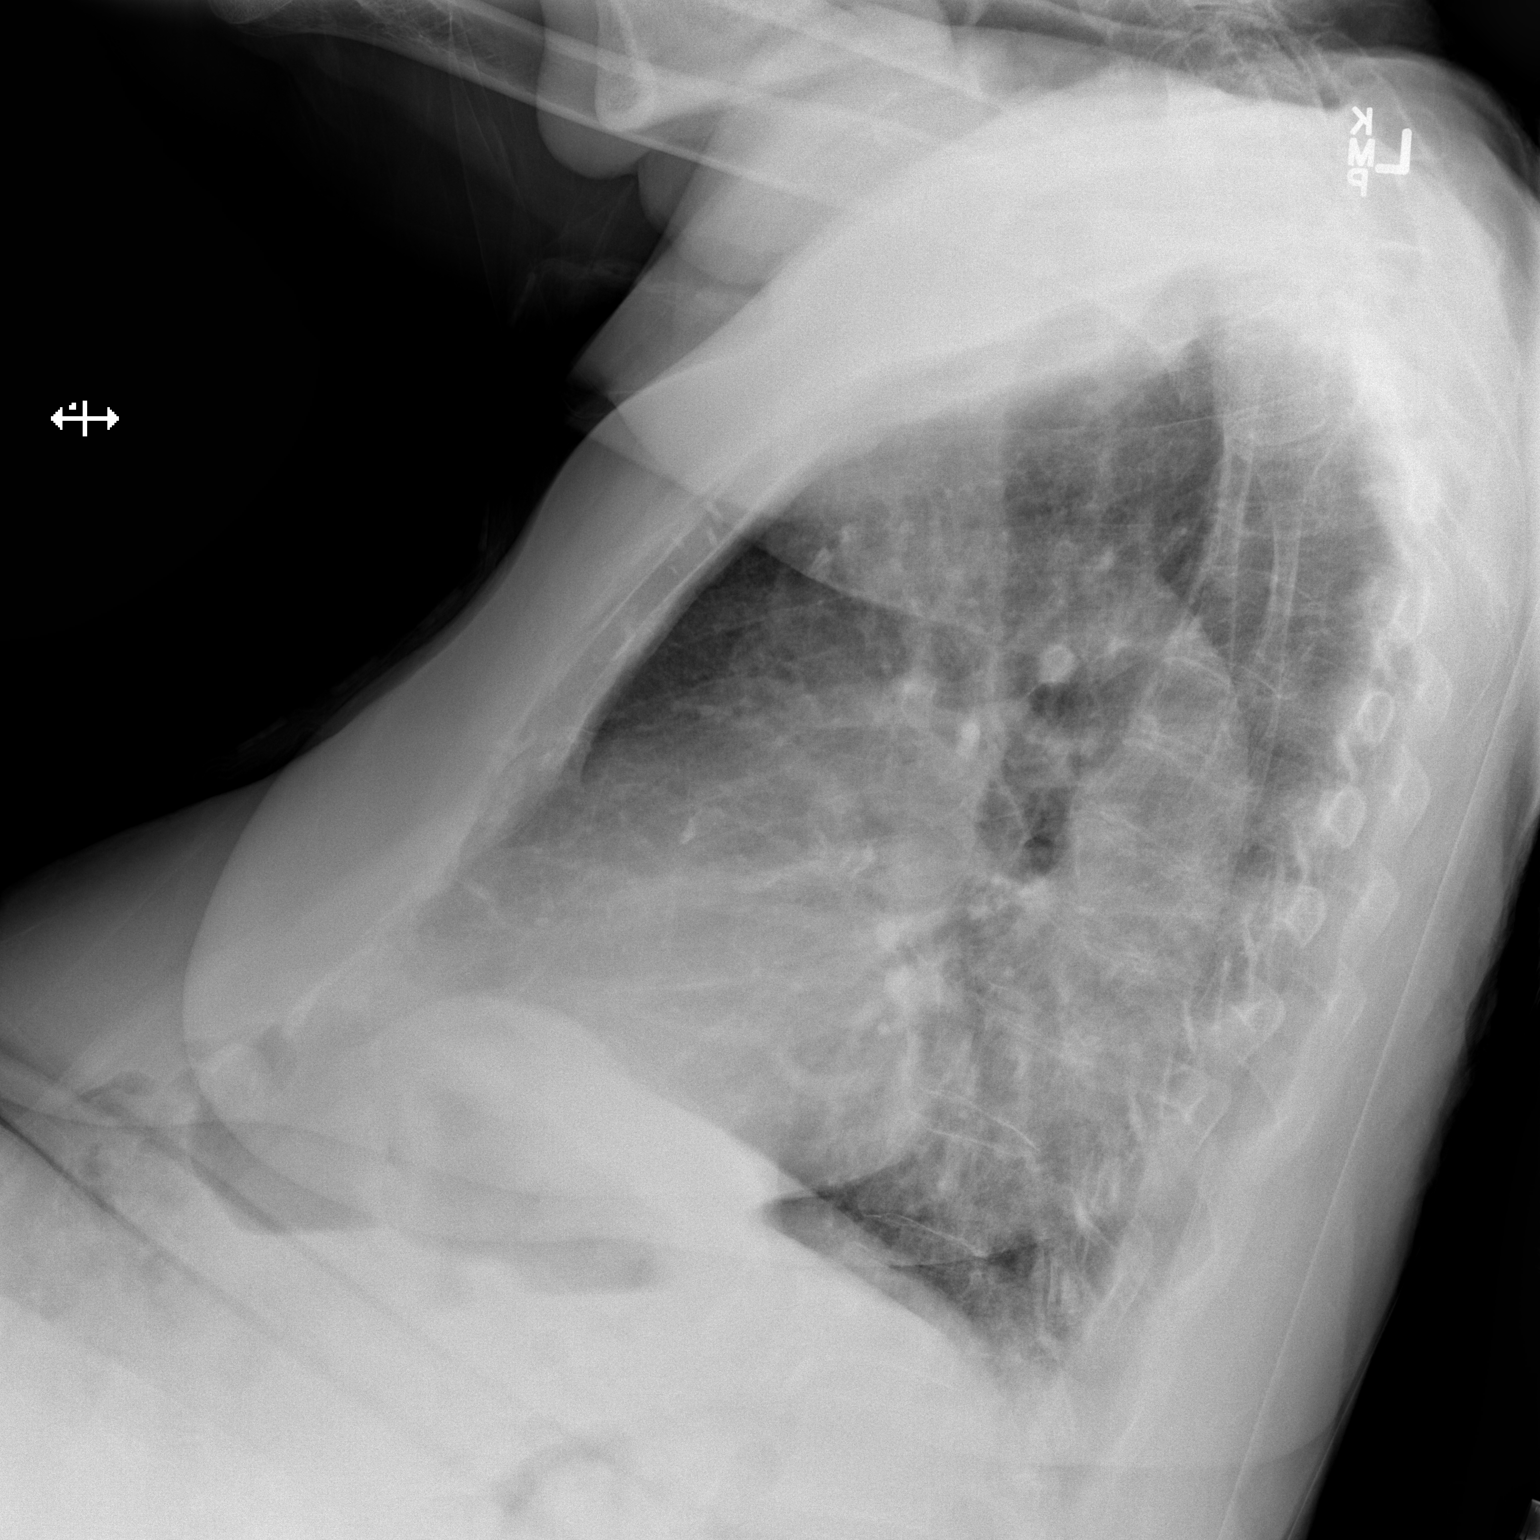

[x chest ap]
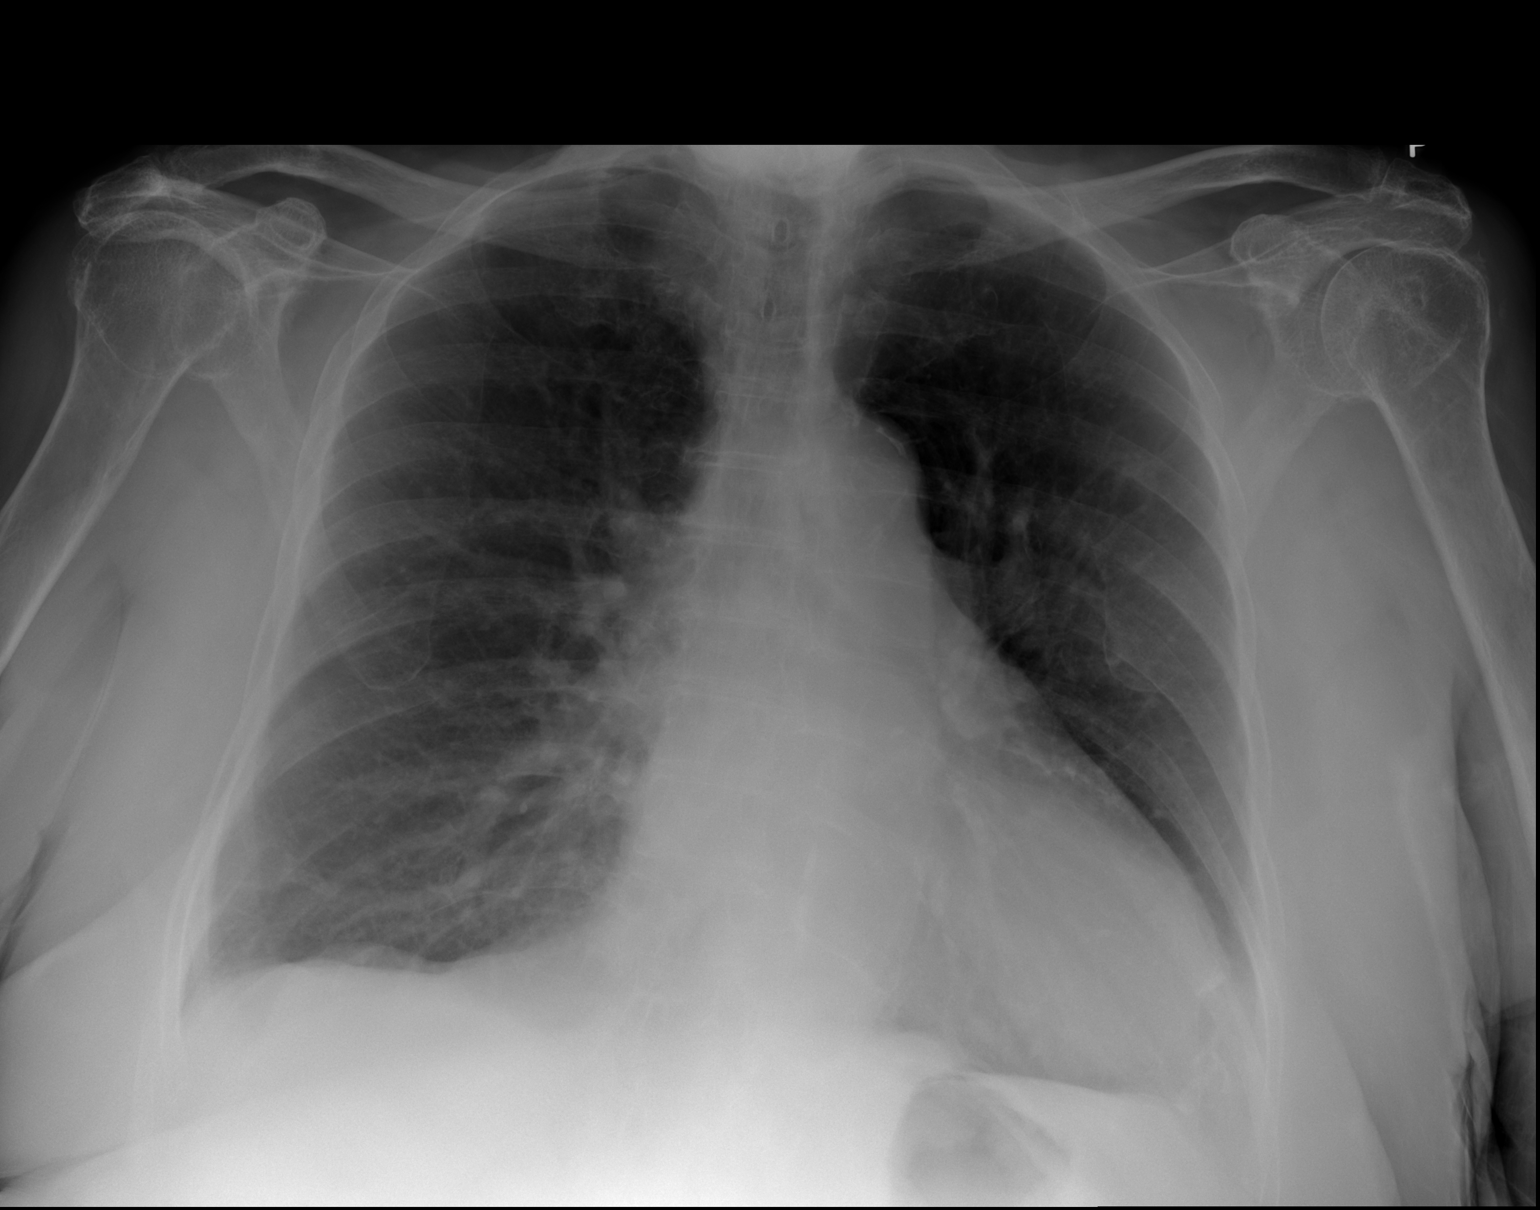

[2 of 2 positions shown; findings below may reference images not displayed]

FINDINGS: Stable enlarged cardiac silhouette. No effusion, infiltrate,
pneumothorax. Chronic bronchitic markings.
IMPRESSION: Cardiomegaly and chronic bronchitic markings.  No acute findings.

## 2016-08-10 ENCOUNTER — Emergency Department (HOSPITAL_COMMUNITY)
Admission: EM | Admit: 2016-08-10 | Discharge: 2016-08-10 | Disposition: A | Discharge status: Home / Self Care | Payer: Medicare Other | Attending: Emergency Medicine | Admitting: Emergency Medicine

## 2016-08-10 ENCOUNTER — Encounter (HOSPITAL_COMMUNITY): Payer: Self-pay

## 2016-08-10 DIAGNOSIS — Z87891 Personal history of nicotine dependence: Secondary | ICD-10-CM | POA: Insufficient documentation

## 2016-08-10 DIAGNOSIS — Z8673 Personal history of transient ischemic attack (TIA), and cerebral infarction without residual deficits: Secondary | ICD-10-CM | POA: Diagnosis not present

## 2016-08-10 DIAGNOSIS — N183 Chronic kidney disease, stage 3 (moderate): Secondary | ICD-10-CM | POA: Diagnosis not present

## 2016-08-10 DIAGNOSIS — I509 Heart failure, unspecified: Secondary | ICD-10-CM | POA: Insufficient documentation

## 2016-08-10 DIAGNOSIS — K649 Unspecified hemorrhoids: Secondary | ICD-10-CM | POA: Diagnosis not present

## 2016-08-10 DIAGNOSIS — Z79899 Other long term (current) drug therapy: Secondary | ICD-10-CM | POA: Insufficient documentation

## 2016-08-10 DIAGNOSIS — Z7982 Long term (current) use of aspirin: Secondary | ICD-10-CM | POA: Insufficient documentation

## 2016-08-10 DIAGNOSIS — K625 Hemorrhage of anus and rectum: Secondary | ICD-10-CM | POA: Diagnosis not present

## 2016-08-10 DIAGNOSIS — I2510 Atherosclerotic heart disease of native coronary artery without angina pectoris: Secondary | ICD-10-CM | POA: Diagnosis not present

## 2016-08-10 DIAGNOSIS — K921 Melena: Secondary | ICD-10-CM | POA: Diagnosis not present

## 2016-08-10 DIAGNOSIS — I130 Hypertensive heart and chronic kidney disease with heart failure and stage 1 through stage 4 chronic kidney disease, or unspecified chronic kidney disease: Secondary | ICD-10-CM | POA: Diagnosis not present

## 2016-08-10 LAB — CBC
HEMATOCRIT: 28.9 % — AB (ref 36.0–46.0)
Hemoglobin: 9.6 g/dL — ABNORMAL LOW (ref 12.0–15.0)
MCH: 31.5 pg (ref 26.0–34.0)
MCHC: 33.2 g/dL (ref 30.0–36.0)
MCV: 94.8 fL (ref 78.0–100.0)
PLATELETS: 93 10*3/uL — AB (ref 150–400)
RBC: 3.05 MIL/uL — ABNORMAL LOW (ref 3.87–5.11)
RDW: 13.2 % (ref 11.5–15.5)
WBC: 3.5 10*3/uL — ABNORMAL LOW (ref 4.0–10.5)

## 2016-08-10 LAB — POC OCCULT BLOOD, ED: FECAL OCCULT BLD: POSITIVE — AB

## 2016-08-10 LAB — COMPREHENSIVE METABOLIC PANEL
ALBUMIN: 4.1 g/dL (ref 3.5–5.0)
ALK PHOS: 52 U/L (ref 38–126)
ALT: 11 U/L — AB (ref 14–54)
AST: 21 U/L (ref 15–41)
Anion gap: 10 (ref 5–15)
BILIRUBIN TOTAL: 0.8 mg/dL (ref 0.3–1.2)
BUN: 36 mg/dL — AB (ref 6–20)
CALCIUM: 9 mg/dL (ref 8.9–10.3)
CO2: 19 mmol/L — ABNORMAL LOW (ref 22–32)
CREATININE: 1.34 mg/dL — AB (ref 0.44–1.00)
Chloride: 115 mmol/L — ABNORMAL HIGH (ref 101–111)
GFR calc Af Amer: 41 mL/min — ABNORMAL LOW (ref >60)
GFR, EST NON AFRICAN AMERICAN: 35 mL/min — AB (ref >60)
GLUCOSE: 94 mg/dL (ref 65–99)
Potassium: 4 mmol/L (ref 3.5–5.1)
Sodium: 144 mmol/L (ref 135–145)
TOTAL PROTEIN: 6.5 g/dL (ref 6.5–8.1)

## 2016-08-10 LAB — PROTIME-INR
INR: 1.14
PROTHROMBIN TIME: 14.7 s (ref 11.4–15.2)

## 2016-08-10 LAB — TYPE AND SCREEN
ABO/RH(D): O POS
Antibody Screen: NEGATIVE

## 2016-08-10 LAB — ABO/RH: ABO/RH(D): O POS

## 2016-08-10 MED ORDER — ACETAMINOPHEN 325 MG PO TABS
650.0000 mg | ORAL_TABLET | Freq: Once | ORAL | Status: AC
  Administered 2016-08-10: 650 mg via ORAL
  Filled 2016-08-10: qty 2

## 2016-08-10 NOTE — ED Notes (Signed)
Awaiting PTAR. 

## 2016-08-10 NOTE — Discharge Instructions (Signed)
Return to emergency department if you have increased bleeding from the rectum, weakness, or lightheadedness. Call your doctor on Monday for follow-up next week.

## 2016-08-10 NOTE — ED Provider Notes (Signed)
[facility] DEPT Provider Note   CSN: [id number] Arrival date & time: 08/10/16  [id number]     History   Chief Complaint Chief Complaint  Patient presents with  . Rectal Bleeding    HPI Jane [name] is a 83 y.o. female.  HPI  83 year old female with a history of anemia, aortic insufficiency, coronary artery disease, CAD who presents today stating that she had one episode of rectal bleeding this morning. She  Out of bed and went to the bathroom and had a bowel movement and states that there was blood on the toilet paper. She also noted some blood in the commode. It is not large quantity. She is currently having no symptoms of pain, dyspnea, or lightheadedness. She had some bleeding like this in the past resulted able to remember when it was or what the cause was. She does not know exactly what medications she is on and from her medication list there is no report of anticoagulants.  Past Medical History:  Diagnosis Date  . Anemia   . Aortic insufficiency    mild  . Arthritis    "arms" (08/09/2015)  . CAD (coronary artery disease)    cath 08/09/2014 95% stenosis in prox to mid RCA s/p DES, 80-90% prox OM2, 50% distal LAD  . CHF (congestive heart failure) ([place])   . CKD (chronic kidney disease) stage 3, GFR 30-59 ml/min   . Colon polyp    2009 colonoscopy, not retrieved for pathology  . Ectropion of left lower eyelid   . GERD (gastroesophageal reflux disease) 2009   EGD with benign gastric polyp too  . Hyperlipidemia   . Hypertension   . Stroke ([place]) 1975  . Vitamin D deficiency     Patient Active Problem List   Diagnosis Date Noted  . Low back pain   . Abdominal aortic aneurysm ([place]) 05/21/2016  . Trochanteric bursitis of left hip   . Other pancytopenia ([place]) 12/27/2015  . CKD (chronic kidney disease) stage 3, GFR 30-59 ml/min 03/31/2015  . Prediabetes 10/16/2014  . Status post insertion of drug-eluting stent into right coronary artery for coronary artery disease  08/07/2014  . Seborrheic keratoses 08/05/2014  . Physical deconditioning 05/31/2014  . Dementia 04/28/2014  . Heart failure with preserved ejection fraction ([place]) 12/27/2012  . Ectropion of left lower eyelid 12/17/2012  . Onychomycosis of toenail 09/18/2012  . PAD (peripheral artery disease) ([place]) 09/18/2012  . Preventative health care 09/18/2012  . Vitamin D deficiency 04/25/2008  . C V A / STROKE 10/29/2007  . Hyperlipidemia 09/24/2006  . Normocytic anemia 09/24/2006  . Essential hypertension 09/24/2006  . OSTEOPENIA 09/24/2006    Past Surgical History:  Procedure Laterality Date  . ABDOMINAL HYSTERECTOMY    . APPENDECTOMY    . ARTERY BIOPSY Left 12/16/2012   Procedure: BIOPSY TEMPORAL ARTERY;  Surgeon: Mal Misty, MD;  Location: [facility];  Service: Vascular;  Laterality: Left;  . CARDIAC CATHETERIZATION    . LEFT HEART CATHETERIZATION WITH CORONARY ANGIOGRAM N/A 08/09/2014   Procedure: LEFT HEART CATHETERIZATION WITH CORONARY ANGIOGRAM;  Surgeon: Peter M Martinique, MD;  Location: [facility] CATH LAB;  Service: Cardiovascular;  Laterality: N/A;  . PERCUTANEOUS CORONARY STENT INTERVENTION (PCI-S)  08/09/2014   Procedure: PERCUTANEOUS CORONARY STENT INTERVENTION (PCI-S);  Surgeon: Peter M Martinique, MD;  Location: [facility] CATH LAB;  Service: Cardiovascular;;  . TONSILLECTOMY      OB History    No data available       Home Medications  Prior to Admission medications   Medication Sig Start Date End Date Taking? Authorizing Provider  acetaminophen (TYLENOL) 325 MG tablet Take 650 mg by mouth every 6 (six) hours as needed for mild pain.    Historical Provider, MD  albuterol (PROAIR HFA) 108 (90 Base) MCG/ACT inhaler Inhale 1-2 puffs into the lungs every 4 (four) hours as needed for wheezing or shortness of breath. 03/12/16   Milagros Loll, MD  aspirin EC 81 MG tablet Take 81 mg by mouth daily.    Historical Provider, MD  atorvastatin (LIPITOR) 40 MG tablet Take 1 tablet (40 mg total) by mouth  daily at 6 PM. 08/18/15   Lucious Groves, DO  bisoprolol (ZEBETA) 5 MG tablet take 1/2 tablet by mouth once daily 10/26/15   Milagros Loll, MD  furosemide (LASIX) 40 MG tablet take 1 tablet by mouth once daily 04/24/16   Milagros Loll, MD  isosorbide mononitrate (IMDUR) 30 MG 24 hr tablet take 1 tablet by mouth once daily 02/23/16   Milagros Loll, MD  losartan (COZAAR) 50 MG tablet take 1 tablet by mouth once daily 06/07/16   Milagros Loll, MD  meloxicam (MOBIC) 7.5 MG tablet Take 1 tablet (7.5 mg total) by mouth daily. 05/22/16   Asencion Partridge, MD  potassium chloride SA (K-DUR,KLOR-CON) 20 MEQ tablet Take 1 tablet (20 mEq total) by mouth daily. 01/19/16   Milagros Loll, MD  Propylene Glycol-Glycerin (ARTIFICIAL TEARS) 1-0.3 % SOLN Apply 1-2 drops to eye daily as needed (dry eyes). 05/21/16   Asencion Partridge, MD    Family History Family History  Problem Relation Age of Onset  . Stroke Mother   . Hypertension Mother   . Stroke Father   . Hypertension Father   . Diabetes Sister     Social History Social History  Substance Use Topics  . Smoking status: Former Smoker    Types: Cigarettes    Quit date: 07/02/1979  . Smokeless tobacco: Former Systems developer  . Alcohol use No     Allergies   Patient has no known allergies.   Review of Systems Review of Systems   Physical Exam Updated Vital Signs BP 133/70 (BP Location: Right Arm)   Pulse 68   Temp 98.4 F (36.9 C) (Oral)   Resp 16   Ht 5' 5.5" (1.664 m)   Wt 72.6 kg   SpO2 100%   BMI 26.22 kg/m   Physical Exam  Constitutional: She appears well-developed and well-nourished.  HENT:  Head: Normocephalic and atraumatic.  Eyes: EOM are normal. Pupils are equal, round, and reactive to light.  Neck: Normal range of motion. Neck supple.  Cardiovascular: Normal rate, regular rhythm and normal heart sounds.   Pulmonary/Chest: Effort normal and breath sounds normal.  Abdominal: Soft. Bowel sounds are normal. She exhibits no  distension. There is no tenderness.  Genitourinary:  Genitourinary Comments: Rectum with some perirectal tissue breakdown with some bleeding, no mass palpated proximally, red tissue paper and stool without apparent blood.   Musculoskeletal: Normal range of motion.  Neurological: She is alert.  Skin: Skin is warm and dry. Capillary refill takes less than 2 seconds.  Nursing note and vitals reviewed.    ED Treatments / Results  Labs (all labs ordered are listed, but only abnormal results are displayed) Labs Reviewed  COMPREHENSIVE METABOLIC PANEL - Abnormal; Notable for the following:       Result Value   Chloride 115 (*)    CO2 19 (*)  BUN 36 (*)    Creatinine, Ser 1.34 (*)    ALT 11 (*)    GFR calc non Af Amer 35 (*)    GFR calc Af Amer 41 (*)    All other components within normal limits  CBC - Abnormal; Notable for the following:    WBC 3.5 (*)    RBC 3.05 (*)    Hemoglobin 9.6 (*)    HCT 28.9 (*)    Platelets 93 (*)    All other components within normal limits  POC OCCULT BLOOD, ED - Abnormal; Notable for the following:    Fecal Occult Bld POSITIVE (*)    All other components within normal limits  PROTIME-INR  TYPE AND SCREEN  ABO/RH    EKG  EKG Interpretation None       Radiology No results found.  Procedures Procedures (including critical care time)  Medications Ordered in ED Medications - No data to display   Initial Impression / Assessment and Plan / ED Course  I have reviewed the triage vital signs and the nursing notes.  Pertinent labs & imaging results that were available during my care of the patient were reviewed by me and considered in my medical decision making (see chart for details).     This is a 83 year old lady who came in today due to rectal bleeding. On her exam she is some skin breakdown around the rectum don't think is where the blood came from. Is not actively bleeding. Rectal exam revealed stool that was not bloody proximal to  this. Hemoccult was due to the blood that was at the external area. Her hemoglobin is within left air value down to 9.6 from prior 10. She is hemodynamically stable. She has been eating and drinking here without any evidence of bleeding. We discussed return precautions need for follow-up and she voices understanding.  Final Clinical Impressions(s) / ED Diagnoses   Final diagnoses:  Rectal bleeding    New Prescriptions New Prescriptions   No medications on file     Pattricia Boss, MD 08/10/16 1059

## 2016-08-10 NOTE — ED Triage Notes (Signed)
Patient arrived from home after noticing bright red blood on toilet paper and in commode. Denies abdominal pain. Has hx of hemorrhoids and constipation. No distress, alert and oriented.

## 2016-08-11 ENCOUNTER — Observation Stay (HOSPITAL_COMMUNITY)
Admission: EM | Admit: 2016-08-11 | Discharge: 2016-08-12 | Disposition: A | Discharge status: Home / Self Care | Payer: Medicare Other | Attending: Internal Medicine | Admitting: Internal Medicine

## 2016-08-11 ENCOUNTER — Encounter (HOSPITAL_COMMUNITY): Payer: Self-pay

## 2016-08-11 ENCOUNTER — Emergency Department (HOSPITAL_COMMUNITY): Payer: Medicare Other

## 2016-08-11 DIAGNOSIS — R0609 Other forms of dyspnea: Secondary | ICD-10-CM | POA: Diagnosis not present

## 2016-08-11 DIAGNOSIS — E785 Hyperlipidemia, unspecified: Secondary | ICD-10-CM | POA: Diagnosis not present

## 2016-08-11 DIAGNOSIS — R0602 Shortness of breath: Secondary | ICD-10-CM | POA: Insufficient documentation

## 2016-08-11 DIAGNOSIS — Z791 Long term (current) use of non-steroidal anti-inflammatories (NSAID): Secondary | ICD-10-CM | POA: Insufficient documentation

## 2016-08-11 DIAGNOSIS — D61818 Other pancytopenia: Secondary | ICD-10-CM | POA: Diagnosis not present

## 2016-08-11 DIAGNOSIS — N179 Acute kidney failure, unspecified: Secondary | ICD-10-CM | POA: Diagnosis not present

## 2016-08-11 DIAGNOSIS — I130 Hypertensive heart and chronic kidney disease with heart failure and stage 1 through stage 4 chronic kidney disease, or unspecified chronic kidney disease: Secondary | ICD-10-CM | POA: Diagnosis not present

## 2016-08-11 DIAGNOSIS — N183 Chronic kidney disease, stage 3 (moderate): Secondary | ICD-10-CM | POA: Diagnosis not present

## 2016-08-11 DIAGNOSIS — Z79899 Other long term (current) drug therapy: Secondary | ICD-10-CM | POA: Insufficient documentation

## 2016-08-11 DIAGNOSIS — I5032 Chronic diastolic (congestive) heart failure: Secondary | ICD-10-CM | POA: Diagnosis not present

## 2016-08-11 DIAGNOSIS — Z7982 Long term (current) use of aspirin: Secondary | ICD-10-CM | POA: Diagnosis not present

## 2016-08-11 DIAGNOSIS — M25552 Pain in left hip: Principal | ICD-10-CM | POA: Insufficient documentation

## 2016-08-11 DIAGNOSIS — R2681 Unsteadiness on feet: Secondary | ICD-10-CM | POA: Diagnosis not present

## 2016-08-11 DIAGNOSIS — F0390 Unspecified dementia without behavioral disturbance: Secondary | ICD-10-CM | POA: Diagnosis not present

## 2016-08-11 DIAGNOSIS — Z955 Presence of coronary angioplasty implant and graft: Secondary | ICD-10-CM | POA: Insufficient documentation

## 2016-08-11 DIAGNOSIS — I739 Peripheral vascular disease, unspecified: Secondary | ICD-10-CM | POA: Diagnosis not present

## 2016-08-11 DIAGNOSIS — Z8673 Personal history of transient ischemic attack (TIA), and cerebral infarction without residual deficits: Secondary | ICD-10-CM | POA: Insufficient documentation

## 2016-08-11 DIAGNOSIS — I110 Hypertensive heart disease with heart failure: Secondary | ICD-10-CM | POA: Diagnosis not present

## 2016-08-11 DIAGNOSIS — I2510 Atherosclerotic heart disease of native coronary artery without angina pectoris: Secondary | ICD-10-CM | POA: Diagnosis not present

## 2016-08-11 DIAGNOSIS — Z87891 Personal history of nicotine dependence: Secondary | ICD-10-CM | POA: Diagnosis not present

## 2016-08-11 DIAGNOSIS — M6281 Muscle weakness (generalized): Secondary | ICD-10-CM

## 2016-08-11 DIAGNOSIS — I509 Heart failure, unspecified: Secondary | ICD-10-CM

## 2016-08-11 LAB — CBC WITH DIFFERENTIAL/PLATELET
BASOS ABS: 0 10*3/uL (ref 0.0–0.1)
BASOS PCT: 0 %
Eosinophils Absolute: 0 10*3/uL (ref 0.0–0.7)
Eosinophils Relative: 0 %
HEMATOCRIT: 30.4 % — AB (ref 36.0–46.0)
HEMOGLOBIN: 10.1 g/dL — AB (ref 12.0–15.0)
LYMPHS PCT: 39 %
Lymphs Abs: 1.2 10*3/uL (ref 0.7–4.0)
MCH: 31.2 pg (ref 26.0–34.0)
MCHC: 33.2 g/dL (ref 30.0–36.0)
MCV: 93.8 fL (ref 78.0–100.0)
MONO ABS: 0.2 10*3/uL (ref 0.1–1.0)
MONOS PCT: 8 %
NEUTROS ABS: 1.7 10*3/uL (ref 1.7–7.7)
NEUTROS PCT: 53 %
Platelets: 96 10*3/uL — ABNORMAL LOW (ref 150–400)
RBC: 3.24 MIL/uL — ABNORMAL LOW (ref 3.87–5.11)
RDW: 12.9 % (ref 11.5–15.5)
WBC: 3.1 10*3/uL — ABNORMAL LOW (ref 4.0–10.5)

## 2016-08-11 LAB — COMPREHENSIVE METABOLIC PANEL
ALBUMIN: 4 g/dL (ref 3.5–5.0)
ALK PHOS: 50 U/L (ref 38–126)
ALT: 12 U/L — ABNORMAL LOW (ref 14–54)
AST: 22 U/L (ref 15–41)
Anion gap: 8 (ref 5–15)
BILIRUBIN TOTAL: 1.6 mg/dL — AB (ref 0.3–1.2)
BUN: 27 mg/dL — AB (ref 6–20)
CALCIUM: 9.1 mg/dL (ref 8.9–10.3)
CO2: 21 mmol/L — ABNORMAL LOW (ref 22–32)
CREATININE: 0.99 mg/dL (ref 0.44–1.00)
Chloride: 114 mmol/L — ABNORMAL HIGH (ref 101–111)
GFR calc Af Amer: 59 mL/min — ABNORMAL LOW (ref >60)
GFR, EST NON AFRICAN AMERICAN: 51 mL/min — AB (ref >60)
GLUCOSE: 95 mg/dL (ref 65–99)
Potassium: 3.6 mmol/L (ref 3.5–5.1)
Sodium: 143 mmol/L (ref 135–145)
TOTAL PROTEIN: 6.4 g/dL — AB (ref 6.5–8.1)

## 2016-08-11 LAB — BRAIN NATRIURETIC PEPTIDE: B NATRIURETIC PEPTIDE 5: 260.4 pg/mL — AB (ref 0.0–100.0)

## 2016-08-11 LAB — I-STAT TROPONIN, ED: Troponin i, poc: 0.01 ng/mL (ref 0.00–0.08)

## 2016-08-11 MED ORDER — FUROSEMIDE 10 MG/ML IJ SOLN
40.0000 mg | Freq: Once | INTRAMUSCULAR | Status: AC
  Administered 2016-08-11: 40 mg via INTRAVENOUS
  Filled 2016-08-11: qty 4

## 2016-08-11 MED ORDER — PROPYLENE GLYCOL-GLYCERIN 1-0.3 % OP SOLN: 1.0000 [drp] | Freq: Every day | OPHTHALMIC | Status: DC | PRN

## 2016-08-11 MED ORDER — ACETAMINOPHEN 325 MG PO TABS
650.0000 mg | ORAL_TABLET | Freq: Four times a day (QID) | ORAL | Status: DC | PRN
  Filled 2016-08-11 (×2): qty 2

## 2016-08-11 MED ORDER — ACETAMINOPHEN 650 MG RE SUPP: 650.0000 mg | Freq: Four times a day (QID) | RECTAL | Status: DC | PRN

## 2016-08-11 MED ORDER — FUROSEMIDE 20 MG PO TABS
40.0000 mg | ORAL_TABLET | Freq: Every day | ORAL | Status: DC
  Administered 2016-08-11: 40 mg via ORAL
  Filled 2016-08-11 (×2): qty 2

## 2016-08-11 MED ORDER — BISOPROLOL FUMARATE 5 MG PO TABS
2.5000 mg | ORAL_TABLET | Freq: Every day | ORAL | Status: DC
  Administered 2016-08-11 – 2016-08-12 (×2): 2.5 mg via ORAL
  Filled 2016-08-11 (×2): qty 1

## 2016-08-11 MED ORDER — ACETAMINOPHEN 325 MG PO TABS
650.0000 mg | ORAL_TABLET | Freq: Once | ORAL | Status: AC
  Administered 2016-08-11: 650 mg via ORAL
  Filled 2016-08-11: qty 2

## 2016-08-11 MED ORDER — ATORVASTATIN CALCIUM 40 MG PO TABS
40.0000 mg | ORAL_TABLET | Freq: Every day | ORAL | Status: DC
  Administered 2016-08-11: 40 mg via ORAL
  Filled 2016-08-11: qty 1

## 2016-08-11 MED ORDER — ISOSORBIDE MONONITRATE ER 30 MG PO TB24
30.0000 mg | ORAL_TABLET | Freq: Every day | ORAL | Status: DC
  Administered 2016-08-11 – 2016-08-12 (×2): 30 mg via ORAL
  Filled 2016-08-11 (×2): qty 1

## 2016-08-11 MED ORDER — ALBUTEROL SULFATE (2.5 MG/3ML) 0.083% IN NEBU: 2.5000 mg | INHALATION_SOLUTION | RESPIRATORY_TRACT | Status: DC | PRN

## 2016-08-11 MED ORDER — POLYVINYL ALCOHOL 1.4 % OP SOLN
1.0000 [drp] | Freq: Every day | OPHTHALMIC | Status: DC | PRN
  Filled 2016-08-11: qty 15

## 2016-08-11 MED ORDER — ASPIRIN EC 81 MG PO TBEC
81.0000 mg | DELAYED_RELEASE_TABLET | Freq: Every day | ORAL | Status: DC
  Administered 2016-08-11 – 2016-08-12 (×2): 81 mg via ORAL
  Filled 2016-08-11 (×2): qty 1

## 2016-08-11 MED ORDER — LOSARTAN POTASSIUM 50 MG PO TABS
50.0000 mg | ORAL_TABLET | Freq: Every day | ORAL | Status: DC
  Administered 2016-08-11 – 2016-08-12 (×2): 50 mg via ORAL
  Filled 2016-08-11 (×2): qty 1

## 2016-08-11 MED ORDER — ALBUTEROL SULFATE HFA 108 (90 BASE) MCG/ACT IN AERS: 1.0000 | INHALATION_SPRAY | RESPIRATORY_TRACT | Status: DC | PRN

## 2016-08-11 MED ORDER — ENOXAPARIN SODIUM 40 MG/0.4ML ~~LOC~~ SOLN
40.0000 mg | SUBCUTANEOUS | Status: DC
  Administered 2016-08-12: 40 mg via SUBCUTANEOUS
  Filled 2016-08-11 (×2): qty 0.4

## 2016-08-11 NOTE — ED Provider Notes (Signed)
[facility] DEPT Provider Note   CSN: [id number] Arrival date & time: 08/11/16  [id number]     History   Chief Complaint Chief Complaint  Patient presents with  . Shortness of Breath    HPI Jane [name] is a 83 y.o. female.  HPI Patient states she has a history of congestive heart failure. She had some dyspnea after going to the bathroom this morning. Denied any chest pain. No recent cough, fever or chills. States that her dyspnea has improved. She denies any new lower extremity swelling. She complains of chronic ongoing left buttock pain. Denies any trauma to the area. States she was told to take Tylenol but does not have any Tylenol at home. Past Medical History:  Diagnosis Date  . Anemia   . Aortic insufficiency    mild  . Arthritis    "arms" (08/09/2015)  . CAD (coronary artery disease)    cath 08/09/2014 95% stenosis in prox to mid RCA s/p DES, 80-90% prox OM2, 50% distal LAD  . CHF (congestive heart failure) ([place])   . CKD (chronic kidney disease) stage 3, GFR 30-59 ml/min   . Colon polyp    2009 colonoscopy, not retrieved for pathology  . Ectropion of left lower eyelid   . GERD (gastroesophageal reflux disease) 2009   EGD with benign gastric polyp too  . Hyperlipidemia   . Hypertension   . Stroke ([place]) 1975  . Vitamin D deficiency     Patient Active Problem List   Diagnosis Date Noted  . Unsteady gait 08/11/2016  . Low back pain   . Abdominal aortic aneurysm ([place]) 05/21/2016  . Trochanteric bursitis of left hip   . Other pancytopenia ([place]) 12/27/2015  . CKD (chronic kidney disease) stage 3, GFR 30-59 ml/min 03/31/2015  . Prediabetes 10/16/2014  . Status post insertion of drug-eluting stent into right coronary artery for coronary artery disease 08/07/2014  . Seborrheic keratoses 08/05/2014  . Physical deconditioning 05/31/2014  . Dementia 04/28/2014  . Heart failure with preserved ejection fraction ([place]) 12/27/2012  . Ectropion of left lower eyelid 12/17/2012    . Onychomycosis of toenail 09/18/2012  . PAD (peripheral artery disease) ([place]) 09/18/2012  . Preventative health care 09/18/2012  . Vitamin D deficiency 04/25/2008  . C V A / STROKE 10/29/2007  . Hyperlipidemia 09/24/2006  . Normocytic anemia 09/24/2006  . Essential hypertension 09/24/2006  . OSTEOPENIA 09/24/2006    Past Surgical History:  Procedure Laterality Date  . ABDOMINAL HYSTERECTOMY    . APPENDECTOMY    . ARTERY BIOPSY Left 12/16/2012   Procedure: BIOPSY TEMPORAL ARTERY;  Surgeon: Mal Misty, MD;  Location: [facility];  Service: Vascular;  Laterality: Left;  . CARDIAC CATHETERIZATION    . LEFT HEART CATHETERIZATION WITH CORONARY ANGIOGRAM N/A 08/09/2014   Procedure: LEFT HEART CATHETERIZATION WITH CORONARY ANGIOGRAM;  Surgeon: Peter M Martinique, MD;  Location: [facility] CATH LAB;  Service: Cardiovascular;  Laterality: N/A;  . PERCUTANEOUS CORONARY STENT INTERVENTION (PCI-S)  08/09/2014   Procedure: PERCUTANEOUS CORONARY STENT INTERVENTION (PCI-S);  Surgeon: Peter M Martinique, MD;  Location: [facility] CATH LAB;  Service: Cardiovascular;;  . TONSILLECTOMY      OB History    No data available       Home Medications    Prior to Admission medications   Medication Sig Start Date End Date Taking? Authorizing Provider  aspirin EC 81 MG tablet Take 81 mg by mouth daily.   Yes Historical Provider, MD  atorvastatin (LIPITOR) 40 MG tablet  Take 1 tablet (40 mg total) by mouth daily at 6 PM. 08/18/15  Yes Lucious Groves, DO  bisoprolol (ZEBETA) 5 MG tablet take 1/2 tablet by mouth once daily 10/26/15  Yes Milagros Loll, MD  furosemide (LASIX) 40 MG tablet take 1 tablet by mouth once daily 04/24/16  Yes Milagros Loll, MD  isosorbide mononitrate (IMDUR) 30 MG 24 hr tablet take 1 tablet by mouth once daily 02/23/16  Yes Milagros Loll, MD  losartan (COZAAR) 50 MG tablet take 1 tablet by mouth once daily 06/07/16  Yes Milagros Loll, MD  meloxicam (MOBIC) 7.5 MG tablet Take 1 tablet (7.5 mg total) by  mouth daily. 05/22/16  Yes Asencion Partridge, MD  acetaminophen (TYLENOL) 325 MG tablet Take 650 mg by mouth every 6 (six) hours as needed for mild pain.    Historical Provider, MD  albuterol (PROAIR HFA) 108 (90 Base) MCG/ACT inhaler Inhale 1-2 puffs into the lungs every 4 (four) hours as needed for wheezing or shortness of breath. 03/12/16   Milagros Loll, MD  potassium chloride SA (K-DUR,KLOR-CON) 20 MEQ tablet Take 1 tablet (20 mEq total) by mouth daily. Patient not taking: Reported on 08/11/2016 01/19/16   Milagros Loll, MD  Propylene Glycol-Glycerin (ARTIFICIAL TEARS) 1-0.3 % SOLN Apply 1-2 drops to eye daily as needed (dry eyes). 05/21/16   Asencion Partridge, MD    Family History Family History  Problem Relation Age of Onset  . Stroke Mother   . Hypertension Mother   . Stroke Father   . Hypertension Father   . Diabetes Sister     Social History Social History  Substance Use Topics  . Smoking status: Former Smoker    Types: Cigarettes    Quit date: 07/02/1979  . Smokeless tobacco: Former Systems developer     Comment: Started in teenage years - [place]  . Alcohol use No     Comment: Quit alcohol 1975-76     Allergies   Patient has no known allergies.   Review of Systems Review of Systems  Constitutional: Negative for chills, fatigue and fever.  HENT: Negative for congestion, rhinorrhea and sore throat.   Respiratory: Positive for shortness of breath. Negative for cough and wheezing.   Cardiovascular: Negative for chest pain, palpitations and leg swelling.  Gastrointestinal: Negative for abdominal pain, constipation, diarrhea, nausea and vomiting.  Genitourinary: Negative for dysuria, flank pain and frequency.  Musculoskeletal: Positive for myalgias. Negative for back pain, neck pain and neck stiffness.  Skin: Negative for rash and wound.  Neurological: Negative for dizziness, weakness, light-headedness, numbness and headaches.  All other systems reviewed and are  negative.    Physical Exam Updated Vital Signs BP 123/55   Pulse 65   Temp 98 F (36.7 C) (Oral)   Resp 18   Ht 5\' 4"  (1.626 m)   Wt 160 lb (72.6 kg)   SpO2 100%   BMI 27.46 kg/m   Physical Exam  Constitutional: She is oriented to person, place, and time. She appears well-developed and well-nourished. No distress.  HENT:  Head: Normocephalic and atraumatic.  Mouth/Throat: Oropharynx is clear and moist. No oropharyngeal exudate.  Eyes: EOM are normal. Pupils are equal, round, and reactive to light.  Neck: Normal range of motion. Neck supple. No JVD present.  Cardiovascular: Normal rate and regular rhythm.  Exam reveals no gallop and no friction rub.   No murmur heard. Pulmonary/Chest: Effort normal and breath sounds normal. No respiratory distress. She has  no wheezes. She has no rales. She exhibits no tenderness.  Abdominal: Soft. Bowel sounds are normal. There is no tenderness. There is no rebound and no guarding.  Musculoskeletal: Normal range of motion. She exhibits tenderness. She exhibits no edema.  Patient has some tenderness over the left buttock. Full range of motion of the left hip. No lower extremity swelling or asymmetry. Distal pulses intact. No midline thoracic or lumbar tenderness. No CVA tenderness bilaterally.  Neurological: She is alert and oriented to person, place, and time.  Moves all extremities without focal deficit. Sensation intact.  Skin: Skin is warm and dry. Capillary refill takes less than 2 seconds. No rash noted. No erythema.  Psychiatric: She has a normal mood and affect. Her behavior is normal.  Nursing note and vitals reviewed.    ED Treatments / Results  Labs (all labs ordered are listed, but only abnormal results are displayed) Labs Reviewed  CBC WITH DIFFERENTIAL/PLATELET - Abnormal; Notable for the following:       Result Value   WBC 3.1 (*)    RBC 3.24 (*)    Hemoglobin 10.1 (*)    HCT 30.4 (*)    Platelets 96 (*)    All other  components within normal limits  COMPREHENSIVE METABOLIC PANEL - Abnormal; Notable for the following:    Chloride 114 (*)    CO2 21 (*)    BUN 27 (*)    Total Protein 6.4 (*)    ALT 12 (*)    Total Bilirubin 1.6 (*)    GFR calc non Af Amer 51 (*)    GFR calc Af Amer 59 (*)    All other components within normal limits  BRAIN NATRIURETIC PEPTIDE - Abnormal; Notable for the following:    B Natriuretic Peptide 260.4 (*)    All other components within normal limits  I-STAT TROPOININ, ED    EKG  EKG Interpretation  Date/Time:  Sunday August 11 2016 11:22:44 EST Ventricular Rate:  81 PR Interval:    QRS Duration: 102 QT Interval:  401 QTC Calculation: 466 R Axis:   27 Text Interpretation:  Sinus rhythm Probable left atrial enlargement Abnormal R-wave progression, early transition Confirmed by Lita Mains  MD, Saloni Lablanc ([id number]) on 08/11/2016 1:06:12 PM       Radiology Dg Chest 2 View  Result Date: 08/11/2016 CLINICAL DATA:  Shortness of Breath EXAM: CHEST  2 VIEW COMPARISON:  07/18/2016 FINDINGS: Cardiomegaly with vascular congestion. Perihilar opacities, right greater than left could reflect early edema. No effusions. No bony abnormality. IMPRESSION: Cardiomegaly with vascular congestion and probable early perihilar edema. Electronically Signed   By: Rolm Baptise M.D.   On: 08/11/2016 11:21   Dg Hip Unilat W Or Wo Pelvis 2-3 Views Left  Result Date: 08/11/2016 CLINICAL DATA:  Shortness of Breath.  Left hip pain. EXAM: DG HIP (WITH OR WITHOUT PELVIS) 2-3V LEFT COMPARISON:  05/21/2016 FINDINGS: Moderate degenerative changes in the hips bilaterally with spurring. SI joints are symmetric and unremarkable. No fracture, subluxation or dislocation. Vascular calcifications noted. IMPRESSION: Mild to moderate degenerative changes in the hips bilaterally. No acute bony abnormality. Electronically Signed   By: Rolm Baptise M.D.   On: 08/11/2016 11:22    Procedures Procedures (including critical  care time)  Medications Ordered in ED Medications  enoxaparin (LOVENOX) injection 40 mg (not administered)  acetaminophen (TYLENOL) tablet 650 mg (not administered)    Or  acetaminophen (TYLENOL) suppository 650 mg (not administered)  acetaminophen (TYLENOL) tablet 650 mg (  650 mg Oral Given 08/11/16 1137)  furosemide (LASIX) injection 40 mg (40 mg Intravenous Given 08/11/16 1139)     Initial Impression / Assessment and Plan / ED Course  I have reviewed the triage vital signs and the nursing notes.  Pertinent labs & imaging results that were available during my care of the patient were reviewed by me and considered in my medical decision making (see chart for details).     Patient was unsteady with ambulation. O2 saturations remained in the 90s though heart rate increased significantly. With evidence of pulmonary edema. Given dose of IV Lasix. Discussed with internal medicine residency service and will evaluate patient in emergency department.  Final Clinical Impressions(s) / ED Diagnoses   Final diagnoses:  Acute on chronic congestive heart failure, unspecified congestive heart failure type [facility])    New Prescriptions New Prescriptions   No medications on file     Julianne Rice, MD 08/11/16 1650
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
## 2016-08-11 NOTE — ED Triage Notes (Addendum)
Pt.states that she was Seen here yesterday for a GI bleed and woke up this AM with shortness of breath. O2 sats 100% on RA but experiencing some dyspnea at rest. CO of L sided flank pain that radiates down

## 2016-08-11 NOTE — ED Notes (Signed)
Pt ambulated in hallway with 1 person assist. Pt a little unsteady, O2 went to 96% but went back up to 100% before pt got into room. Pt heart rate did go to 105 while walking.

## 2016-08-11 NOTE — Progress Notes (Signed)
Pt oriented to room on admission to floor from the ED.  Call bell at reach.  Bed alarm set.  Instructed to call for assistance.  Verbalized understanding.  Karie Kirks, Therapist, sports.

## 2016-08-11 NOTE — H&P (Signed)
Date: 08/11/2016               Patient Name:  Jane [name] MRN: [id number]  DOB: 07/19/1928 Age / Sex: 83 y.o., female   PCP: Milagros Loll, MD         Medical Service: Internal Medicine Teaching Service         Attending Physician: Dr. Oval Linsey, MD    First Contact: Dr. Lovena Le Pager: [phone]  Second Contact: Dr. Posey Pronto Pager: [phone]       After Hours (After 5p/  First Contact Pager: [phone]  weekends / holidays): Second Contact Pager: [phone]   Chief Complaint: Left hip pain  History of Present Illness: RITTIE MARUT is a 83 y.o. female with a h/o CAD s/p PCI/DES 08/2014, HFpEF (LVEF 55-60% in 08/2015), HTN, HLD, CKD3, GERD, and CVA who presents today for left hip pain. Patient came to ED yesterday for bloody bowel movement. When she had a bowel movement yesterday morning patient noticed blood on the toilet paper and blood in the toilet. ED performed a rectal exam and hemoccult and discharged her yesterday.  She comes back today reporting of leg pain. Patient reports that the pain is on the posterior and lateral side of her left hip. The pain radiates down to her legs occasionally. Patient cannot describe her pain as she does not know how to describe it. She denies any numbness or tingling accompanying it. Denies any incontinence. Patient does not know how long she has had this pain. The pain comes and goes. She doesn't know if anything makes the pain worse, but says that Tylenol gives relief.   Patient also complained of shortness of breath. She states that when she  Walks in her house she develops shortness of breath. She states that she has been told whenever she has shortness of breath to come to the ED.   ED provider concerned to send patient home with left hip pain.   Meds:  Current Meds  Medication Sig  . aspirin EC 81 MG tablet Take 81 mg by mouth daily.  Marland Kitchen atorvastatin (LIPITOR) 40 MG tablet Take 1 tablet (40 mg total) by mouth daily at 6 PM.  .  bisoprolol (ZEBETA) 5 MG tablet take 1/2 tablet by mouth once daily  . furosemide (LASIX) 40 MG tablet take 1 tablet by mouth once daily  . isosorbide mononitrate (IMDUR) 30 MG 24 hr tablet take 1 tablet by mouth once daily  . losartan (COZAAR) 50 MG tablet take 1 tablet by mouth once daily  . meloxicam (MOBIC) 7.5 MG tablet Take 1 tablet (7.5 mg total) by mouth daily.     Allergies: Allergies as of 08/11/2016  . (No Known Allergies)   Past Medical History:  Diagnosis Date  . Anemia   . Aortic insufficiency    mild  . Arthritis    "arms" (08/09/2015)  . CAD (coronary artery disease)    cath 08/09/2014 95% stenosis in prox to mid RCA s/p DES, 80-90% prox OM2, 50% distal LAD  . CHF (congestive heart failure) ([place])   . CKD (chronic kidney disease) stage 3, GFR 30-59 ml/min   . Colon polyp    2009 colonoscopy, not retrieved for pathology  . Ectropion of left lower eyelid   . GERD (gastroesophageal reflux disease) 2009   EGD with benign gastric polyp too  . Hyperlipidemia   . Hypertension   . Stroke ([place]) 1975  . Vitamin D deficiency  Family History:  Family History  Problem Relation Age of Onset  . Stroke Mother   . Hypertension Mother   . Stroke Father   . Hypertension Father   . Diabetes Sister     Social History:  Social History   Social History  . Marital status: Widowed    Spouse name: N/A  . Number of children: N/A  . Years of education: N/A   Occupational History  . Not on file.   Social History Main Topics  . Smoking status: Former Smoker    Types: Cigarettes    Quit date: 07/02/1979  . Smokeless tobacco: Former Systems developer     Comment: Started in teenage years - [place]  . Alcohol use No     Comment: Quit alcohol 1975-76  . Drug use: No  . Sexual activity: No   Other Topics Concern  . Not on file   Social History Narrative   Ms. Hearty lives alone in an apartment in [place]. Her nephew, and her son and his wife live nearby and visit and help  often. She is independent in ADLs but depends on family for IADLs. She is ambulatory in her apartment without a walker but requires this when she leaves her apartment.      Review of Systems: A complete ROS was negative except as per HPI.   Physical Exam: Blood pressure 153/58, pulse 68, temperature 98 F (36.7 C), temperature source Oral, resp. rate 14, height 5\' 4"  (1.626 m), weight 72.6 kg (160 lb), SpO2 99 %.  Physical Exam  Constitutional: She appears well-developed and well-nourished.  Eyes:  Left ectropion on lower eyelid  Cardiovascular: Normal rate, regular rhythm and normal heart sounds.   Pulmonary/Chest: Effort normal and breath sounds normal.  Musculoskeletal: She exhibits no edema.  Tenderness over left shin.    CBC Latest Ref Rng & Units 08/11/2016 08/10/2016 07/18/2016  WBC 4.0 - 10.5 K/uL 3.1(L) 3.5(L) 3.0(L)  Hemoglobin 12.0 - 15.0 g/dL 10.1(L) 9.6(L) 10.4(L)  Hematocrit 36.0 - 46.0 % 30.4(L) 28.9(L) 31.5(L)  Platelets 150 - 400 K/uL 96(L) 93(L) 101(L)   CMP Latest Ref Rng & Units 08/11/2016 08/10/2016 07/18/2016  Glucose 65 - 99 mg/dL 95 94 102(H)  BUN 6 - 20 mg/dL 27(H) 36(H) 19  Creatinine 0.44 - 1.00 mg/dL 0.99 1.34(H) 1.07(H)  Sodium 135 - 145 mmol/L 143 144 143  Potassium 3.5 - 5.1 mmol/L 3.6 4.0 4.0  Chloride 101 - 111 mmol/L 114(H) 115(H) 115(H)  CO2 22 - 32 mmol/L 21(L) 19(L) 20(L)  Calcium 8.9 - 10.3 mg/dL 9.1 9.0 9.2  Total Protein 6.5 - 8.1 g/dL 6.4(L) 6.5 6.8  Total Bilirubin 0.3 - 1.2 mg/dL 1.6(H) 0.8 0.9  Alkaline Phos 38 - 126 U/L 50 52 69  AST 15 - 41 U/L 22 21 20   ALT 14 - 54 U/L 12(L) 11(L) 14   BNP    Component Value Date/Time   BNP 260.4 (H) 08/11/2016 1020    ProBNP    Component Value Date/Time   PROBNP 341.2 06/12/2014 0514    EKG: Sinus rhythm, unremarkable for pathology  CXR:  FINDINGS: Cardiomegaly with vascular congestion. Perihilar opacities, right greater than left could reflect early edema. No effusions. No  bony abnormality.  IMPRESSION: Cardiomegaly with vascular congestion and probable early perihilar edema\  Electronically Signed   By: Rolm Baptise M.D.   On: 08/11/2016 11:22  Left Hip XR: FINDINGS: Moderate degenerative changes in the hips bilaterally with spurring. SI joints are symmetric  and unremarkable. No fracture, subluxation or dislocation. Vascular calcifications noted.  IMPRESSION: Mild to moderate degenerative changes in the hips bilaterally. No acute bony abnormality.  Electronically Signed   By: Rolm Baptise M.D.   On: 08/11/2016 11:22   Assessment & Plan by Problem: Active Problems:   Unsteady gait  Laurice TRANIECE BERND is a 83 y.o. female with a h/o CAD s/p PCI/DES 08/2014, HFpEF (LVEF 55-60% in 08/2015), HTN, HLD, CKD3, GERD, and CVA who presents today for left hip pain. ED provider was concerned to discharge her with unsteady gait 2/2 to left hip pain. Will observe overnight and have PT evaluate her.   Unsteady Gait 2/2 Left Hip Pain Patient reports that the pain is on the posterior and lateral side of her left hip. The pain radiates down to her legs occasionally. Patient cannot describe her pain as she does not know how to describe it. She denies any numbness or tingling accompanying it. Denies any incontinence. Patient does not know how long she has had this pain. The pain comes and goes. She doesn't know if anything makes the pain worse, but says that Tylenol gives relief. Patient is a poor historian. ED provider reluctant to discharge patient with unsteady gait as she lives on her own. Will revisit patient and get better history and reexamine her injury. Will f/u with MRI vs CT vs U/S upon further evaluation of hip pain. Will have PT se her tomorrow. -- Tylenol PRN -- MRI vs CT vs U/S  -- PT consult  HFpEF HTN CAD Patient has LVEF 55-60% in 08/2015. Her BNP is elevated @ 260. CXR shows cardiomegaly with vascular congestion and probable early perihilar edema.  Patient has minimal chest pain and SOB on physical exam. EKG was unremarkable. Could be possible CHF exacerbation, but unlikely.   -- cont home Bisoprolol 2.5mg  daily -- cont home Imdur 30 mg daily -- cont home Losartan 50 mg daily  Ectropion of left lower eyelid -- Liquifilm tears PRN  CKD3 Baseline Cr 1.1, current Cr 0.99 -- BMP in AM  HLD  -- cont home Lipitor 40 mg daily   FEN/GI: Heart Healthy Diet DVT ppx: SQH Code status: FULL Dispo: Admit patient to Observation with expected length of stay less than 2 midnights.  Signed: Graciela Husbands, Medical Student 08/11/2016, 2:41 PM  Pager: [phone]

## 2016-08-11 NOTE — ED Notes (Signed)
Admitting at bedside 

## 2016-08-11 NOTE — ED Notes (Signed)
Report given to Nelly RN

## 2016-08-12 ENCOUNTER — Telehealth: Payer: Self-pay

## 2016-08-12 DIAGNOSIS — R2681 Unsteadiness on feet: Secondary | ICD-10-CM

## 2016-08-12 DIAGNOSIS — G5702 Lesion of sciatic nerve, left lower limb: Secondary | ICD-10-CM

## 2016-08-12 DIAGNOSIS — N179 Acute kidney failure, unspecified: Secondary | ICD-10-CM

## 2016-08-12 DIAGNOSIS — R0602 Shortness of breath: Secondary | ICD-10-CM | POA: Diagnosis not present

## 2016-08-12 DIAGNOSIS — M6281 Muscle weakness (generalized): Secondary | ICD-10-CM

## 2016-08-12 DIAGNOSIS — I509 Heart failure, unspecified: Secondary | ICD-10-CM

## 2016-08-12 DIAGNOSIS — M25552 Pain in left hip: Secondary | ICD-10-CM | POA: Diagnosis not present

## 2016-08-12 LAB — BASIC METABOLIC PANEL
ANION GAP: 9 (ref 5–15)
BUN: 29 mg/dL — AB (ref 6–20)
CALCIUM: 8.8 mg/dL — AB (ref 8.9–10.3)
CO2: 22 mmol/L (ref 22–32)
Chloride: 109 mmol/L (ref 101–111)
Creatinine, Ser: 1.46 mg/dL — ABNORMAL HIGH (ref 0.44–1.00)
GFR calc Af Amer: 37 mL/min — ABNORMAL LOW (ref >60)
GFR, EST NON AFRICAN AMERICAN: 32 mL/min — AB (ref >60)
GLUCOSE: 90 mg/dL (ref 65–99)
Potassium: 3.8 mmol/L (ref 3.5–5.1)
Sodium: 140 mmol/L (ref 135–145)

## 2016-08-12 MED ORDER — ENOXAPARIN SODIUM 30 MG/0.3ML ~~LOC~~ SOLN: 30.0000 mg | SUBCUTANEOUS | Status: DC

## 2016-08-12 MED ORDER — SODIUM CHLORIDE 0.9 % IV SOLN: INTRAVENOUS | Status: DC

## 2016-08-12 MED ORDER — HYDROCODONE-ACETAMINOPHEN 5-325 MG PO TABS
1.0000 | ORAL_TABLET | Freq: Once | ORAL | Status: AC
  Administered 2016-08-12: 1 via ORAL
  Filled 2016-08-12: qty 1

## 2016-08-12 NOTE — Evaluation (Signed)
Physical Therapy Evaluation Patient Details Name: Jane [name] MRN: [id number] DOB: 08/04/1928 Today's Date: 08/12/2016   History of Present Illness   Jane [name] is an 83 y.o female with a PMHx of CAD s/p PCI/DES 08/2014, HFpEF (LVEF 55-60%), HTN, HLD, CKD stage III, and prior CVA who presents with left Butt/hip pain and gait instability. The patient reports that the pain is on the posterior lateral side of her left hip. The pain occasionally radiates down her leg.  Clinical Impression  Pt admitted with above diagnosis. Pt currently with functional limitations due to the deficits listed below (see PT Problem List). Pt ambulated 58' with RW and min A, was unable to ambulate without RW though this is how she gets around her apt. Generalized weakness noted with ambulation. Concerning left hip pain, she reports that her hip is not bothering her as much as usual today but that it does limit her at home. Do not feel that she is safe at home alone at this point.  Pt will benefit from skilled PT to increase their independence and safety with mobility to allow discharge to the venue listed below.       Follow Up Recommendations SNF;Supervision for mobility/OOB    Equipment Recommendations  None recommended by PT    Recommendations for Other Services       Precautions / Restrictions Precautions Precautions: Fall Restrictions Weight Bearing Restrictions: No      Mobility  Bed Mobility Overal bed mobility: Needs Assistance Bed Mobility: Supine to Sit     Supine to sit: Supervision     General bed mobility comments: increased time to get legs off bed but able to do so without physical assist  Transfers Overall transfer level: Needs assistance Equipment used: None Transfers: Sit to/from Stand Sit to Stand: Min assist         General transfer comment: let pt try standing without RW since she does not use it within apt but she was unable to safely stand without support, needed min  HHA  Ambulation/Gait Ambulation/Gait assistance: Min assist Ambulation Distance (Feet): 75 Feet Assistive device: Rolling walker (2 wheeled) Gait Pattern/deviations: Step-through pattern;Shuffle;Decreased stride length;Trunk flexed;Decreased weight shift to left Gait velocity: decreased Gait velocity interpretation: <1.8 ft/sec, indicative of risk for recurrent falls General Gait Details: pt fatigued quickly with ambulation, vc's to turn aroud after 35' because her legs were beginning to tremble. She demonstrated decreased self awareness of fatigue. Min A for support, especially last 35'.   Stairs            Wheelchair Mobility    Modified Rankin (Stroke Patients Only)       Balance Overall balance assessment: Needs assistance Sitting-balance support: Bilateral upper extremity supported Sitting balance-Leahy Scale: Poor Sitting balance - Comments: relied on hands to hold mattress EOB, had difficult getting wt forward Postural control: Posterior lean Standing balance support: Single extremity supported Standing balance-Leahy Scale: Poor Standing balance comment: unable to stand safely without UE support                             Pertinent Vitals/Pain Pain Assessment: Faces Faces Pain Scale: Hurts little more Pain Location: left hip, "not too bad today" Pain Descriptors / Indicators: Aching Pain Intervention(s): Limited activity within patient's tolerance;Monitored during session    Home Living Family/patient expects to be discharged to:: Private residence Living Arrangements: Alone Available Help at Discharge: Family;Available PRN/intermittently Type of Home: [place]  Access: Elevator     Home Layout: One level Home Equipment: Walker - 2 wheels;Tub bench;Grab bars - tub/shower;Grab bars - toilet Additional Comments: pt reports that her niece and nephew get food for her because she does not drive but she does her own cooking and cleaning     Prior Function Level of Independence: Needs assistance   Gait / Transfers Assistance Needed: Furniture walks in apartment, uses RW for community mobility  ADL's / Homemaking Assistance Needed: sponge bathes and dresses herself        Hand Dominance   Dominant Hand: Right    Extremity/Trunk Assessment   Upper Extremity Assessment Upper Extremity Assessment: Defer to OT evaluation    Lower Extremity Assessment Lower Extremity Assessment: Generalized weakness    Cervical / Trunk Assessment Cervical / Trunk Assessment: Kyphotic  Communication   Communication: No difficulties  Cognition Arousal/Alertness: Awake/alert Behavior During Therapy: WFL for tasks assessed/performed Overall Cognitive Status: Within Functional Limits for tasks assessed                 General Comments: pt occasionally a bit hard to follow but appropriate throughout and was able to give a full history    General Comments General comments (skin integrity, edema, etc.): O2 sats 95%, DOE 2/4, HR 75 bpm with ambulation    Exercises     Assessment/Plan    PT Assessment Patient needs continued PT services  PT Problem List Decreased strength;Decreased activity tolerance;Decreased balance;Decreased mobility;Decreased knowledge of use of DME;Decreased knowledge of precautions;Pain          PT Treatment Interventions DME instruction;Gait training;Functional mobility training;Therapeutic activities;Therapeutic exercise;Balance training;Patient/family education    PT Goals (Current goals can be found in the Care Plan section)  Acute Rehab PT Goals Patient Stated Goal: agreeable to rehab if she can get it, has been to [facility] before PT Goal Formulation: With patient Time For Goal Achievement: 08/26/16 Potential to Achieve Goals: Good    Frequency Min 3X/week   Barriers to discharge Decreased caregiver support lives alone    Co-evaluation               End of Session Equipment  Utilized During Treatment: Gait belt Activity Tolerance: Patient limited by fatigue Patient left: in chair;with call bell/phone within reach;with chair alarm set Nurse Communication: Mobility status    Functional Assessment Tool Used: clinical judgement Functional Limitation: Mobility: Walking and moving around Mobility: Walking and Moving Around Current Status [phone]): At least 1 percent but less than 20 percent impaired, limited or restricted Mobility: Walking and Moving Around Goal Status [phone]): 0 percent impaired, limited or restricted    Time: 1103-1129 PT Time Calculation (min) (ACUTE ONLY): 26 min   Charges:   PT Evaluation $PT Eval Moderate Complexity: 1 Procedure PT Treatments $Gait Training: 8-22 mins   PT G Codes:   PT G-Codes **NOT FOR INPATIENT CLASS** Functional Assessment Tool Used: clinical judgement Functional Limitation: Mobility: Walking and moving around Mobility: Walking and Moving Around Current Status [id number]): At least 1 percent but less than 20 percent impaired, limited or restricted Mobility: Walking and Moving Around Goal Status [phone]): 0 percent impaired, limited or restricted  Leighton Roach, [place] 08/12/2016, 12:39 PM

## 2016-08-12 NOTE — Consult Note (Signed)
   [facility] CM Inpatient Consult   08/12/2016  YAVONNE KISS [birth year] [id number]  Patient was assessed for [place] Management for community services as part of Medicare [place] and a secondf hospitalization in the past 6 months with ED visits.  Chart review reveals the patient is Ms. Bitterman is an 82 y.o female with a PMHx of CAD s/p PCI/DES 08/2014, HFpEF (LVEF 55-60%), HTN, HLD, CKD stage III, and prior CVA who presents with left Butt/hip pain and gait instability. The patient reports that the pain is on the posterior lateral side of her left hip. The pain occasionally radiates down her leg.  Patient was previously active with [place] Management with [facility] and HX with social worker.  Met with patient at bedside regarding being restarted with [facility] services. The patient endorses [name] as her primary care provider. Patient has her medications delivery by [facility].   She states her nephew provides her transportation for errands and doctor's visits.  She lives at [facility].  Patient consents to post hospital follow up.  Consent active on file a brochure was given with [place] Management information given.  Will follow for progress and disposition needs.   Of note, [facility] Care Management services does not replace or interfere with any services that are arranged by inpatient case management or social work. For additional questions or referrals please contact:   Natividad Brood, RN BSN [facility] Liaison  [phone] business mobile phone Toll free office [phone]

## 2016-08-12 NOTE — Evaluation (Signed)
Occupational Therapy Evaluation Patient Details Name: Jane [name] MRN: [id number] DOB: 1929-01-19 Today's Date: 08/12/2016    History of Present Illness  Jane [name] is an 83 y.o female with a PMHx of CAD s/p PCI/DES 08/2014, HFpEF (LVEF 55-60%), HTN, HLD, CKD stage III, and prior CVA who presents with left Butt/hip pain and gait instability. The patient reports that the pain is on the posterior lateral side of her left hip. The pain occasionally radiates down her leg.   Clinical Impression   Pt with decline in function and safety with ADLs and ADL mobility with decreased strength, balance and endurance. Pt reported some SOB ambulating back form bathroom. Prior to activity at rest pt's O2 100% and HR 64, after activity, pt's O2 100% and HR 81. Pt reports that she does not have any family that physically checks in on her but instead with phone calls. Do not feel pt [place] safe to return home alone at this time. Pt would benefit from acute OT services to address impairments to increase level of function and safety    Follow Up Recommendations  SNF    Equipment Recommendations  None recommended by OT;Other (comment) (TBD at next venue of care)    Recommendations for Other Services       Precautions / Restrictions Precautions Precautions: Fall Restrictions Weight Bearing Restrictions: No      Mobility Bed Mobility Overal bed mobility: Needs Assistance Bed Mobility: Supine to Sit     Supine to sit: Supervision     General bed mobility comments: pt up in recliner upon arrival  Transfers Overall transfer level: Needs assistance Equipment used: None Transfers: Sit to/from Stand Sit to Stand: Min assist         General transfer comment: let pt try standing without RW since she does not use it within apt but she was unable to safely stand without support, needed min HHA    Balance Overall balance assessment: Needs assistance Sitting-balance support: Bilateral upper  extremity supported Sitting balance-Leahy Scale: Poor Sitting balance - Comments: relied on hands to hold mattress EOB, had difficult getting wt forward Postural control: Posterior lean Standing balance support: Single extremity supported Standing balance-Leahy Scale: Poor Standing balance comment: unable to stand safely without UE support                            ADL Overall ADL's : Needs assistance/impaired     Grooming: Wash/dry hands;Wash/dry face;Standing;Min guard;Minimal assistance   Upper Body Bathing: Minimal assistance;Standing (simulated) Upper Body Bathing Details (indicate cue type and reason): Poor sitting balance Lower Body Bathing: Moderate assistance   Upper Body Dressing : Minimal assistance (simulated) Upper Body Dressing Details (indicate cue type and reason): Poor sitting balance Lower Body Dressing: Moderate assistance   Toilet Transfer: Minimal assistance;Ambulation;RW;Comfort height toilet;Grab bars;Cueing for safety   Toileting- Clothing Manipulation and Hygiene: Minimal assistance       Functional mobility during ADLs: Rolling walker;Minimal assistance;Cueing for safety       Vision  wears glasses, no change from baseline              Pertinent Vitals/Pain Pain Assessment: 0-10 Pain Score: 5  Faces Pain Scale: Hurts little more Pain Location: L hip Pain Descriptors / Indicators: Aching;Grimacing;Guarding Pain Intervention(s): Limited activity within patient's tolerance;Monitored during session;Repositioned     Hand Dominance Right   Extremity/Trunk Assessment Upper Extremity Assessment Upper Extremity Assessment: Generalized weakness;RUE deficits/detail RUE Deficits /  Details: R shouler flexion impaired. approx 60-75 AROM. Pt reports that her arthritis prevents her from raising her shoulder all the way   Lower Extremity Assessment Lower Extremity Assessment: Defer to PT evaluation   Cervical / Trunk Assessment Cervical  / Trunk Assessment: Kyphotic   Communication Communication Communication: No difficulties   Cognition Arousal/Alertness: Awake/alert Behavior During Therapy: WFL for tasks assessed/performed Overall Cognitive Status: Within Functional Limits for tasks assessed                 Pt very pleasant and cooperative   General Comments                    Home Living Family/patient expects to be discharged to:: Private residence Living Arrangements: Alone Available Help at Discharge: Family;Available PRN/intermittently Type of Home: Apartment Home Access: Elevator     Home Layout: One level     Bathroom Shower/Tub: Teacher, early years/pre: Standard     Home Equipment: Environmental consultant - 2 wheels;Tub bench;Grab bars - tub/shower;Grab bars - toilet (pt reports that she doesn't get in shower even with tub bench)   Additional Comments: pt reports that her niece and nephew get food for her because she does not drive but she does her own cooking and cleaning      Prior Functioning/Environment Level of Independence: Needs assistance  Gait / Transfers Assistance Needed: Furniture walks in apartment, uses RW for community mobility ADL's / Homemaking Assistance Needed: sponge bathes and dresses herself and soes not get in shower even though she has a tub bench   Comments: pt reports that her son and his wife check in on her by calling and that one of her nephews calls "just about everyday" and another nephew checks on her periodically by calling and gets her groceries        OT Problem List: Decreased strength;Impaired balance (sitting and/or standing);Pain;Decreased knowledge of use of DME or AE   OT Treatment/Interventions: Self-care/ADL training;DME and/or AE instruction;Therapeutic activities;Patient/family education    OT Goals(Current goals can be found in the care plan section) Acute Rehab OT Goals Patient Stated Goal: agreeable to rehab if she can get it, has been to  [facility] before OT Goal Formulation: With patient Time For Goal Achievement: 08/19/16 Potential to Achieve Goals: Good ADL Goals Pt Will Perform Grooming: with min guard assist;standing Pt Will Perform Upper Body Bathing: with min guard assist;with supervision;with set-up;sitting Pt Will Perform Lower Body Bathing: with min assist;sitting/lateral leans;sit to/from stand Pt Will Perform Upper Body Dressing: with min guard assist;with supervision;with set-up;sitting Pt Will Transfer to Toilet: with min guard assist;ambulating;grab bars Pt Will Perform Toileting - Clothing Manipulation and hygiene: with min guard assist;sit to/from stand  OT Frequency: Min 2X/week   Barriers to D/C: Decreased caregiver support          Co-evaluation              End of Session Equipment Utilized During Treatment: Gait belt;Rolling walker  Activity Tolerance: Patient tolerated treatment well Patient left: in chair;with call bell/phone within reach;with chair alarm set   Time: [id number] OT Time Calculation (min): 26 min Charges:  OT General Charges $OT Visit: 1 Procedure OT Evaluation $OT Eval Moderate Complexity: 1 Procedure OT Treatments $Therapeutic Activity: 8-22 mins G-Codes: OT G-codes **NOT FOR INPATIENT CLASS** Functional Assessment Tool Used: clinical judgement Functional Limitation: Self care Self Care Current Status [id number]): At least 40 percent but less than 60 percent impaired, limited or restricted Self  Care Goal Status [phone]): At least 20 percent but less than 40 percent impaired, limited or restricted  Britt Bottom 08/12/2016, 12:56 PM

## 2016-08-12 NOTE — Telephone Encounter (Signed)
Hospital TOC per dr Lovena Le. Discharge 08/12/2016, appt 08/13/2016.

## 2016-08-12 NOTE — Progress Notes (Signed)
We will plan for discharge this afternoon. On reevaluation of the patient this afternoon she was sitting up in a chair eating comfortably. She states that she does not have increased shortness of breath. She does not have chest pain. Her heart failure is at her chronic baseline. She was evaluated by physical therapy who recommended skilled nursing facility placement but based on the patient's insurance this would require a 3 midnight inpatient hospitalization prior to placement. At this time I do not think her condition warrants further time in the inpatient setting. We will discharge her with extremely close follow-up in the internal medicine clinic for either tomorrow or the following day. Paperwork can be started at that time if the patient would still need skilled nursing facility placement. Again, on reevaluation of the patient this afternoon she states that her conditions are at their baseline and are no worse than prior. Her hip pain improved with pain medicine and we'll plan for discharge accordingly.

## 2016-08-12 NOTE — Discharge Summary (Signed)
Name: Jane [name] MRN: [id number] DOB: [birth year] 83 y.o. PCP: Milagros Loll, MD  Date of Admission: 08/11/2016  9:26 AM Date of Discharge: 08/12/2016 Attending Physician: Oval Linsey, MD  Discharge Diagnosis: 1. Left lateral hip pain most likely piriformis syndrome 2. Chronic shortness of breath 3. Unsteady gait 4. Acute kidney injury Active Problems:   Unsteady gait   Acute on chronic congestive heart failure (HCC)   Muscle weakness (generalized)   Discharge Medications: Allergies as of 08/12/2016   No Known Allergies     Medication List    TAKE these medications   acetaminophen 325 MG tablet Commonly known as:  TYLENOL Take 650 mg by mouth every 6 (six) hours as needed for mild pain.   albuterol 108 (90 Base) MCG/ACT inhaler Commonly known as:  PROAIR HFA Inhale 1-2 puffs into the lungs every 4 (four) hours as needed for wheezing or shortness of breath.   aspirin EC 81 MG tablet Take 81 mg by mouth daily.   atorvastatin 40 MG tablet Commonly known as:  LIPITOR Take 1 tablet (40 mg total) by mouth daily at 6 PM.   bisoprolol 5 MG tablet Commonly known as:  ZEBETA take 1/2 tablet by mouth once daily   furosemide 40 MG tablet Commonly known as:  LASIX take 1 tablet by mouth once daily   isosorbide mononitrate 30 MG 24 hr tablet Commonly known as:  IMDUR take 1 tablet by mouth once daily   losartan 50 MG tablet Commonly known as:  COZAAR take 1 tablet by mouth once daily   meloxicam 7.5 MG tablet Commonly known as:  MOBIC Take 1 tablet (7.5 mg total) by mouth daily.   potassium chloride SA 20 MEQ tablet Commonly known as:  K-DUR,KLOR-CON Take 1 tablet (20 mEq total) by mouth daily.   Propylene Glycol-Glycerin 1-0.3 % Soln Commonly known as:  ARTIFICIAL TEARS Apply 1-2 drops to eye daily as needed (dry eyes).       Disposition and follow-up:   [name] was discharged from [facility] in Stable condition.  At  the hospital follow up visit please address:  1.  Please assess and see if the patient would want skilled nursing facility as was recommended by physical therapy.  2.  Labs / imaging needed at time of follow-up: CMP  3.  Pending labs/ test needing follow-up: None  Follow-up Appointments: 1. Appointment for 08/13/2006 at 3:15 in the afternoon of the [facility] internal medicine clinic  Hospital Course by problem list: Active Problems:   Unsteady gait   Acute on chronic congestive heart failure (HCC)   Muscle weakness (generalized)   1. Left lateral hip pain most likely piriformis syndrome Patient presented to the [facility] emergency department on 08/11/2016 with left lateral hip. This is a chronic issue for her. She was having a slight increase in this pain which prompted her to come to the emergency department. It's predominantly located in her left hip but sometimes radiates to both sides of her back in the lumbosacral area. Prior imaging just shows degenerative disease. No red flag symptoms associated with this pain. Physical examination mostly unremarkable. In the emergency department the patient was found to be afebrile. Laboratory evaluation demonstrated an anemia with a hemoglobin of 10.1 and a leukopenia with a white blood cell count of 3.1. These blood counts are chronic issues for her. Her comprehensive metabolic panel was largely unremarkable. I-STAT troponin was negative. Chest x-ray was normal. The  emergency medicine attending did not feel entirely strong about admitting the patient but was unsure given her comorbidities. She was admitted for observation and pain control. On reevaluation the following morning the patient's pain is no longer located in the left lateral hip. It was bilateral in her lower back. It was consistent with her chronic pain. It improves with Tylenol. She will be discharged with Tylenol for pain control. The exact etiology of this pain is unclear however I  suspect is multifactorial with some component of piriformis syndrome and degenerative joint disease. She may benefit from ultrasound or further imaging in the outpatient setting.  2. Shortness of breath The patient has a history of heart failure with preserved ejection fraction with her last known left ventricular ejection fraction of 55-60%. She has chronic dyspnea on exertion. At the time of presentation she was continuing to endorse this dyspnea. She does not give a great history but on focused questioning states that her shortness of breath is not worse than baseline. She was given a single dose of IV furosemide 40 mg in the emergency department. Her creatinine bumped to 1.46 following administration of this Lasix and so her home Lasix was held as well as her losartan and the following day. These will need to be restarted when you see her in clinic. She may of had some minimal component of congestive heart failure exacerbation which also prompted her to come to the emergency department but she would deny increased symptoms on all questioning. As stated, she will be discharged on her home medication regimen but her losartan and furosemide were held for a single day. Please restart these in the clinic. Additionally, please obtain a repeat BMP. At the time of discharge I spent a long time discussing with the patient and she stated several times that her symptoms were at her baseline and she was not having increased shortness of breath.  3. Gait instability The patient was also complaining of some gait instability. This has also been an ongoing issue for her. I suspect this is most likely secondary to her hip pain and overall deconditioning. She was evaluated by physical therapy who recommended skilled nursing facility placement. However, given the patient's insurance status she would've had to stay for 3 midnights to qualify for skilled nursing facility placement and we did not fill her medical conditions  warranted the stay. Please discuss with her at her appointment if she is amenable to going to skilled nursing facility. If she is this paperwork may need to be started in the outpatient setting. She does have a walker at home which helps regulate. She has an apartment and lives alone but says she has some family nearby that helped.  4. AKI Patient's creatinine at presentation 0.99 following IV diuretics this increased to 1.46. I suspect her acute kidney injury was secondary to increased diuretics and prerenal in etiology. Secondary to this increase in creatinine as stated above her losartan and furosemide were held for the date 08/12/2016. Please start these medications back in clinic tomorrow. I would also consider repeating a basic metabolic panel to evaluate for resolution of her creatinine. Discharge Vitals:   BP (!) 118/55 (BP Location: Left Arm)   Pulse 66   Temp 98.1 F (36.7 C) (Oral)   Resp 16   Ht 5\' 5"  (1.651 m)   Wt 157 lb 8 oz (71.4 kg) Comment: a scale  SpO2 100%   BMI 26.21 kg/m   Pertinent Labs, Studies, and Procedures:  1.  Diagnostic chest radiograph-cardiomegaly vascular congestion early perihilar edema 2. Hip and pelvis films-mild to moderate degenerative changes in his bilaterally no acute bony abnormality  Discharge Instructions: Discharge Instructions    Diet - low sodium heart healthy    Complete by:  As directed    Discharge instructions    Complete by:  As directed    We have treated your back pain.  I have made an appointment for you in the [facility] internal medicine clinic for 08/13/2016 at 3:15 in the afternoon. This appointment is tomorrow afternoon. Please ensure you make this appointment as it'll be extremely important for your care.  We will restart your medications. Please do not take your furosemide or losartan until tomorrow. We did not give you these medicines today but I would like for you to start them back tomorrow. You can discuss this with the  doctor that you see in clinic tomorrow.  Please continue to take all of your medications as you were before you came to the hospital. For your hip and back pain you may take meloxicam or Tylenol. I would suggest taking Tylenol for the next 1-2 days and if your pain continues then you may try meloxicam.  Again, please make sure you make the appointment in clinic tomorrow afternoon.   Increase activity slowly    Complete by:  As directed       Signed: Ophelia Shoulder, MD 08/12/2016, 1:57 PM   Pager: [phone]

## 2016-08-12 NOTE — H&P (Signed)
Internal Medicine Attending Admission Note Date: 08/12/2016  Patient name: TOMAS KODER Medical record number: [id number] Date of birth: February 07, 1929 Age: 83 y.o. Gender: female  I saw and evaluated the patient. I reviewed the resident's note and I agree with the resident's findings and plan as documented in the resident's note.  Chief Complaint(s): Chronic left hip pain and shortness of breath.  History - key components related to admission:  Ms. Palleschi is an 83 year old woman with a history of hypertension, hyperlipidemia, stage III chronic kidney disease, coronary artery disease status post percutaneous coronary intervention, and chronic diastolic heart failure who presents with chronic left hip pain and shortness of breath. The symptoms appear to be long-standing and are not progressive. That being said, the patient lives alone. She presented to the emergency department with these complaints and was admitted for development of a discharge plan. When seen on rounds this morning she was without any acute complaints and noted that these symptoms were her usual symptoms.  Physical Exam - key components related to admission:  Vitals:   08/11/16 1812 08/11/16 1950 08/12/16 0301 08/12/16 1201  BP:  (!) 110/46 (!) 129/46 (!) 118/55  Pulse: 73 60 60 66  Resp:    16  Temp:  99 F (37.2 C) 98.6 F (37 C) 98.1 F (36.7 C)  TempSrc:  Oral Oral Oral  SpO2:  99% 97% 100%  Weight:   157 lb 8 oz (71.4 kg)   Height:       Gen.: Well-developed, well nourished, elderly woman sitting comfortably in bed in no acute distress. Left buttock: Diffuse tenderness posteriorly throughout most of the left buttock. There is no discrete point tenderness in the left trochanteric area or left ischial tuberosity.  Lab results:  Basic Metabolic Panel:  Recent Labs  08/11/16 1020 08/12/16 0408  NA 143 140  K 3.6 3.8  CL 114* 109  CO2 21* 22  GLUCOSE 95 90  BUN 27* 29*  CREATININE 0.99 1.46*  CALCIUM 9.1  8.8*   Liver Function Tests:  Recent Labs  08/10/16 0823 08/11/16 1020  AST 21 22  ALT 11* 12*  ALKPHOS 52 50  BILITOT 0.8 1.6*  PROT 6.5 6.4*  ALBUMIN 4.1 4.0   CBC:  Recent Labs  08/10/16 0823 08/11/16 1020  WBC 3.5* 3.1*  NEUTROABS  --  1.7  HGB 9.6* 10.1*  HCT 28.9* 30.4*  MCV 94.8 93.8  PLT 93* 96*   Coagulation:  Recent Labs  08/10/16 0823  INR 1.14   Misc. Labs:  BNP 260.4  Imaging results:  Dg Chest 2 View  Result Date: 08/11/2016 CLINICAL DATA:  Shortness of Breath EXAM: CHEST  2 VIEW COMPARISON:  07/18/2016 FINDINGS: Cardiomegaly with vascular congestion. Perihilar opacities, right greater than left could reflect early edema. No effusions. No bony abnormality. IMPRESSION: Cardiomegaly with vascular congestion and probable early perihilar edema. Electronically Signed   By: Rolm Baptise M.D.   On: 08/11/2016 11:21   Dg Hip Unilat W Or Wo Pelvis 2-3 Views Left  Result Date: 08/11/2016 CLINICAL DATA:  Shortness of Breath.  Left hip pain. EXAM: DG HIP (WITH OR WITHOUT PELVIS) 2-3V LEFT COMPARISON:  05/21/2016 FINDINGS: Moderate degenerative changes in the hips bilaterally with spurring. SI joints are symmetric and unremarkable. No fracture, subluxation or dislocation. Vascular calcifications noted. IMPRESSION: Mild to moderate degenerative changes in the hips bilaterally. No acute bony abnormality. Electronically Signed   By: Rolm Baptise M.D.   On: 08/11/2016 11:22  Other results:  [id number] sinus rhythm at 81 bpm, normal axis, normal intervals, no significant Q waves, no LVH by voltage, good R wave progression, no ST or T-wave changes. This ECG is unchanged from the previous ECG on 07/18/2016.  Assessment & Plan by Problem:  Ms. Dipierro is an 83 year old woman with a history of hypertension, hyperlipidemia, stage III chronic kidney disease, coronary artery disease status post percutaneous coronary intervention, and chronic diastolic heart failure who  presents with chronic left hip pain and shortness of breath. The symptoms are long-standing and likely related to her chronic diastolic heart failure and osteoarthritis. With regards to her chronic diastolic heart failure she was diuresed and there was a bump in her creatinine suggesting that she is, at the very least, euvolemic at this point. With regards to her chronic osteoarthritic pain the differential also includes a pyriformis. syndrome. I doubt it is a trochanteric bursitis or an ischial bursitis based on the physical exam today. She does require rehabilitation for her gait and overall conditioning. As there is no acute exacerbation of her chronic problems at this time we can have her seen quickly in the [place] and have these arrangements made in the outpatient setting for further physical therapy and rehabilitation.  1) Chronic deconditioning: She will have social work services in the [place] work on placement to a skilled nursing facility for rehabilitation and conditioning as an outpatient.  2) Chronic diastolic heart failure: We will continue the result bisoprolol, Lasix, isosorbide mononitrate, losartan, and aspirin. If volume becomes a problem we may need to discontinue the meloxicam. This can be decided in the outpatient setting depending upon her results with the conditioning and gait training.  3) Disposition: She is stable for discharge home today with quick follow-up in the Internal Medicine Center to arrange for skilled nursing facility placement in the outpatient setting.

## 2016-08-12 NOTE — Progress Notes (Addendum)
Subjective: No acute events overnight. Pt continues to c/o back pain but today this seems to be more diffuse bilaterally and not located in a single area. She also continues to mention her shortness of breath but does not state that her symptoms are worse than her baseline. She is very difficult to obtain a history. My opinion that she limits her activity to compensate for shortness of breath. Outside of her back pain or shortness of breath she has no additional acute complaints today.  Objective:  Vital signs in last 24 hours: Vitals:   08/11/16 1809 08/11/16 1812 08/11/16 1950 08/12/16 0301  BP:   (!) 110/46 (!) 129/46  Pulse: 76 73 60 60  Resp:      Temp:   99 F (37.2 C) 98.6 F (37 C)  TempSrc:   Oral Oral  SpO2:   99% 97%  Weight:    157 lb 8 oz (71.4 kg)  Height:       Physical Exam  Constitutional: She is oriented to person, place, and time. She appears well-developed and well-nourished.  HENT:  Head: Normocephalic and atraumatic.  Eyes:  Left ectropion  Cardiovascular: Normal rate and regular rhythm.  Exam reveals no friction rub.   No murmur heard. Respiratory: Effort normal and breath sounds normal. No respiratory distress. She has no wheezes.  GI: Soft. Bowel sounds are normal. She exhibits no distension. There is no tenderness.  Musculoskeletal:  Minimal tenderness to palpation in the lumbosacral spinal region. No significant tenderness to palpation over the left lateral greater trochanter.  Neurological: She is alert and oriented to person, place, and time.     Assessment/Plan:  Active Problems:   Unsteady gait  Jane [name] is an 83 y.o female with a PMHx of CAD s/p PCI/DES 08/2014, HFpEF (LVEF 55-60%), HTN, HLD, CKD stage III, and prior CVA who presents with shortness of breath, left lateral hip pain and gait instability.   # Left lateral hip and buttock pain ## Gait instability Patient's lower back pain seems less lateralized today. It is more  consistent diffusely bilaterally in the lumbosacral area. This presentation is consistent with her imaging of osteoarthritis. The exact etiology of her pain is unclear although the differential diagnosis includes arthritis, trochanteric bursitis, tendinopathy or piriformis syndrome given some localization in the buttock. These etiologies would improve with pain control, anti-inflammatories and work with physical therapy. I think an ultrasound may be less useful given that her pain this morning was described as bilateral and is more chronic in nature than previously thought. The Tylenol was not working for her as much this morning so we will provide something stronger for a 1 time dose. For now we will wait for physical therapy evaluation and treatment of the patient. As far as her gait instability I suspect part of this is deconditioning. There may be a component of orthostatic hypotension so we will check orthostatic vitals today. -- Physical therapy eval and treat -- Norco 5-325 1 -- Orthostatic vitals   # CHF exacerbation ## HFpEF Patient complains of some shortness of breath this morning. She minimizes her symptoms. She seems to avoid activities which cause shortness of breath. She was given furosemide 40 mg IV yesterday afternoon. She does have a bump in her creatinine so we will hold continue diuresis today. We will transition her back to her daily dose of furosemide 40 mg by mouth either tomorrow or at discharge. -- Hold furosemide today -- Resume furosemide 40 mg once daily  by mouth at discharge or tomorrow   # CAD s/p PCI/DES ## HTN ## HLD The patient is developed an AKI. I suspect this secondary to diuresis and decreased by mouth intake. She is currently not hypertensive. We will hold her losartan today. -- Aspirin 81 mg once daily -- Atorvastatin 40 mg daily -- Bisoprolol 2.5 mg daily -- Isosorbide mononitrate 30 mg daily -- Losartan 50 mg daily- hold today  # Acute kidney  injury The patient's creatinine bumped from 0.99-1.46. I suspect this is likely secondary to increased diuresis. For now we will hold her dose of furosemide and her losartan and resume this tomorrow pending recovery of her renal function or discharge. -- Hold furosemide -- Hold losartan -- Repeat BMP   # Pancytopenia Patient's hemoglobin at admission 10.1. She has had documented chronic anemia. Her MCV is within normal limits. Prior B12 within normal limits. Prior folate within normal limits. Her trends in her hemoglobin over the previous several months have been in the tens. Her platelets have consistently been in the 90s or low 100s. She is also leukopenic in the threes. Her counts seem stable. She may benefit from bone marrow biopsy or aspiration for better characterization of her pancytopenia.   DVT/PE prophylaxis: Lovenox Diet: Regular diet Code: Full code  Dispo: Anticipated discharge today or tomorrow pending recs.    Jane Shoulder, MD 08/12/2016, 10:46 AM Pager: [phone]

## 2016-08-13 ENCOUNTER — Ambulatory Visit: Payer: Self-pay

## 2016-08-15 ENCOUNTER — Encounter: Payer: Self-pay | Admitting: Pulmonary Disease

## 2016-08-15 ENCOUNTER — Telehealth: Payer: Self-pay | Admitting: Internal Medicine

## 2016-08-15 ENCOUNTER — Ambulatory Visit (INDEPENDENT_AMBULATORY_CARE_PROVIDER_SITE_OTHER): Payer: Medicare Other | Admitting: Pulmonary Disease

## 2016-08-15 VITALS — BP 113/44 | HR 76 | Temp 98.0°F | Ht 64.0 in | Wt 161.9 lb

## 2016-08-15 DIAGNOSIS — D649 Anemia, unspecified: Secondary | ICD-10-CM | POA: Diagnosis not present

## 2016-08-15 DIAGNOSIS — D61818 Other pancytopenia: Secondary | ICD-10-CM | POA: Diagnosis not present

## 2016-08-15 DIAGNOSIS — E785 Hyperlipidemia, unspecified: Secondary | ICD-10-CM

## 2016-08-15 DIAGNOSIS — I5030 Unspecified diastolic (congestive) heart failure: Secondary | ICD-10-CM

## 2016-08-15 DIAGNOSIS — E538 Deficiency of other specified B group vitamins: Secondary | ICD-10-CM | POA: Diagnosis not present

## 2016-08-15 DIAGNOSIS — Z5189 Encounter for other specified aftercare: Secondary | ICD-10-CM

## 2016-08-15 DIAGNOSIS — I5032 Chronic diastolic (congestive) heart failure: Secondary | ICD-10-CM | POA: Diagnosis not present

## 2016-08-15 DIAGNOSIS — R5381 Other malaise: Secondary | ICD-10-CM

## 2016-08-15 DIAGNOSIS — Z79899 Other long term (current) drug therapy: Secondary | ICD-10-CM | POA: Diagnosis not present

## 2016-08-15 DIAGNOSIS — Z87891 Personal history of nicotine dependence: Secondary | ICD-10-CM | POA: Diagnosis not present

## 2016-08-15 MED ORDER — ISOSORBIDE MONONITRATE ER 30 MG PO TB24: 30.0000 mg | ORAL_TABLET | Freq: Every day | ORAL | 2 refills | Status: DC

## 2016-08-15 MED ORDER — ATORVASTATIN CALCIUM 40 MG PO TABS: 40.0000 mg | ORAL_TABLET | Freq: Every day | ORAL | 11 refills | Status: DC

## 2016-08-15 MED ORDER — FUROSEMIDE 40 MG PO TABS: 40.0000 mg | ORAL_TABLET | Freq: Every day | ORAL | 3 refills | Status: DC

## 2016-08-15 MED ORDER — MELOXICAM 7.5 MG PO TABS: 7.5000 mg | ORAL_TABLET | Freq: Every day | ORAL | 1 refills | Status: DC

## 2016-08-15 MED ORDER — BISOPROLOL FUMARATE 5 MG PO TABS: 2.5000 mg | ORAL_TABLET | Freq: Every day | ORAL | 1 refills | Status: DC

## 2016-08-15 NOTE — Progress Notes (Signed)
   CC: dyspnea follow up  HPI:  [name] is an 83 year old woman with history of anemia, GERD, hyperlipidemia, stroke, CAD s/p DES in Q000111Q, chronic diastolic CHF, COPD, CKD stage III, hypertension here for evaluation of hospitalization for dyspnea.  No chest pain. She had mild bleeding when she wiped after a BM. No dyspnea   Past Medical History:  Diagnosis Date  . Anemia   . Aortic insufficiency    mild  . Arthritis    "arms" (08/09/2015)  . CAD (coronary artery disease)    cath 08/09/2014 95% stenosis in prox to mid RCA s/p DES, 80-90% prox OM2, 50% distal LAD  . CHF (congestive heart failure) ([place])   . CKD (chronic kidney disease) stage 3, GFR 30-59 ml/min   . Colon polyp    2009 colonoscopy, not retrieved for pathology  . Ectropion of left lower eyelid   . GERD (gastroesophageal reflux disease) 2009   EGD with benign gastric polyp too  . Hyperlipidemia   . Hypertension   . Stroke ([place]) 1975  . Vitamin D deficiency     Review of Systems:   No fevers or chills No nausea or vomiting  Physical Exam:  Vitals:   08/15/16 1332  BP: (!) 113/44  Pulse: 76  Temp: 98 F (36.7 C)  TempSrc: Oral  SpO2: 99%  Weight: 161 lb 14.4 oz (73.4 kg)  Height: 5\' 4"  (1.626 m)   General Apperance: NAD HEENT: Normocephalic, atraumatic, anicteric sclera Neck: Supple, trachea midline Lungs: Clear to auscultation bilaterally. No wheezes, rhonchi or rales. Breathing comfortably Heart: Regular rate and rhythm, no murmur/rub/gallop Abdomen: Soft, nontender, nondistended, no rebound/guarding Extremities: Warm and well perfused, no edema Skin: No rashes or lesions Neurologic: Alert and interactive. No gross deficits.   Assessment & Plan:   See Encounters Tab for problem based charting.  Patient discussed with Dr. Dareen Piano

## 2016-08-15 NOTE — Patient Instructions (Signed)
Please keep taking your medications as prescribed Follow up in 4-6 weeks

## 2016-08-15 NOTE — Assessment & Plan Note (Signed)
Will check with social work about SNF versus home health PT.

## 2016-08-15 NOTE — Assessment & Plan Note (Signed)
Refilled statin

## 2016-08-15 NOTE — Assessment & Plan Note (Addendum)
Recheck vitamin B12 today.  ADDENDUM: Undetectable. Will start vitamin b12 1000 mcg orally once per day. Recheck at follow up. Consider injection if PO ineffective. Discussed with patient.

## 2016-08-15 NOTE — Telephone Encounter (Signed)
Note made in error

## 2016-08-15 NOTE — Assessment & Plan Note (Addendum)
Assessment: No dyspnea and lungs clear bilaterally.   Plan:  Continue Lasix 40mg  daily and losartan 50mg  daily Recheck BMP

## 2016-08-15 NOTE — Assessment & Plan Note (Signed)
Recheck CBC today. 

## 2016-08-16 ENCOUNTER — Other Ambulatory Visit: Payer: Self-pay

## 2016-08-16 LAB — CMP14 + ANION GAP
A/G RATIO: 2 (ref 1.2–2.2)
ALK PHOS: 63 IU/L (ref 39–117)
ALT: 13 IU/L (ref 0–32)
AST: 22 IU/L (ref 0–40)
Albumin: 4.6 g/dL (ref 3.5–4.7)
Anion Gap: 21 mmol/L — ABNORMAL HIGH (ref 10.0–18.0)
BILIRUBIN TOTAL: 0.7 mg/dL (ref 0.0–1.2)
BUN/Creatinine Ratio: 35 — ABNORMAL HIGH (ref 12–28)
BUN: 49 mg/dL — ABNORMAL HIGH (ref 8–27)
CHLORIDE: 106 mmol/L (ref 96–106)
CO2: 18 mmol/L (ref 18–29)
Calcium: 9.2 mg/dL (ref 8.7–10.3)
Creatinine, Ser: 1.4 mg/dL — ABNORMAL HIGH (ref 0.57–1.00)
GFR calc Af Amer: 40 mL/min/{1.73_m2} — ABNORMAL LOW (ref >59)
GFR calc non Af Amer: 34 mL/min/{1.73_m2} — ABNORMAL LOW (ref >59)
Globulin, Total: 2.3 g/dL (ref 1.5–4.5)
Glucose: 88 mg/dL (ref 65–99)
POTASSIUM: 4.2 mmol/L (ref 3.5–5.2)
SODIUM: 145 mmol/L — AB (ref 134–144)
Total Protein: 6.9 g/dL (ref 6.0–8.5)

## 2016-08-16 LAB — CBC
Hematocrit: 28.6 % — ABNORMAL LOW (ref 34.0–46.6)
Hemoglobin: 9.9 g/dL — ABNORMAL LOW (ref 11.1–15.9)
MCH: 31.5 pg (ref 26.6–33.0)
MCHC: 34.6 g/dL (ref 31.5–35.7)
MCV: 91 fL (ref 79–97)
PLATELETS: 113 10*3/uL — AB (ref 150–379)
RBC: 3.14 x10E6/uL — AB (ref 3.77–5.28)
RDW: 13.7 % (ref 12.3–15.4)
WBC: 3.8 10*3/uL (ref 3.4–10.8)

## 2016-08-16 LAB — VITAMIN B12

## 2016-08-16 MED ORDER — VITAMIN B-12 1000 MCG PO TABS: 1000.0000 ug | ORAL_TABLET | Freq: Every day | ORAL | 2 refills | Status: DC

## 2016-08-16 NOTE — Addendum Note (Signed)
Addended by: Jacques Earthly T on: 08/16/2016 05:01 PM   Modules accepted: Orders

## 2016-08-19 ENCOUNTER — Telehealth: Payer: Self-pay

## 2016-08-19 NOTE — Telephone Encounter (Signed)
Asking about Vit B12 tablet - inform level was low (<150) and usually low levels can cause fatigue, anemia, numbness, memory loss and wt loss/decreased appetite. Said she just wanted to know "what it is for".

## 2016-08-19 NOTE — Telephone Encounter (Signed)
Questions about meds. Please call back.  

## 2016-08-19 NOTE — Telephone Encounter (Signed)
Spoke with emergency contact he is fine with [facility] referral he understands Carlean Purl from [place] will call him today

## 2016-08-19 NOTE — Telephone Encounter (Signed)
Thanks

## 2016-08-20 DIAGNOSIS — M6281 Muscle weakness (generalized): Secondary | ICD-10-CM | POA: Diagnosis not present

## 2016-08-20 DIAGNOSIS — I69398 Other sequelae of cerebral infarction: Secondary | ICD-10-CM | POA: Diagnosis not present

## 2016-08-21 NOTE — Progress Notes (Signed)
Internal Medicine Clinic Attending  Case discussed with Dr. Krall at the time of the visit.  We reviewed the resident's history and exam and pertinent patient test results.  I agree with the assessment, diagnosis, and plan of care documented in the resident's note.  

## 2016-08-23 ENCOUNTER — Telehealth: Payer: Self-pay | Admitting: Pulmonary Disease

## 2016-08-23 DIAGNOSIS — M6281 Muscle weakness (generalized): Secondary | ICD-10-CM | POA: Diagnosis not present

## 2016-08-23 DIAGNOSIS — I69398 Other sequelae of cerebral infarction: Secondary | ICD-10-CM | POA: Diagnosis not present

## 2016-08-23 NOTE — Telephone Encounter (Signed)
PT from [facility] calling for Orders.

## 2016-08-28 DIAGNOSIS — M6281 Muscle weakness (generalized): Secondary | ICD-10-CM | POA: Diagnosis not present

## 2016-08-28 DIAGNOSIS — I69398 Other sequelae of cerebral infarction: Secondary | ICD-10-CM | POA: Diagnosis not present

## 2016-08-30 DIAGNOSIS — I69398 Other sequelae of cerebral infarction: Secondary | ICD-10-CM | POA: Diagnosis not present

## 2016-08-30 DIAGNOSIS — M6281 Muscle weakness (generalized): Secondary | ICD-10-CM | POA: Diagnosis not present

## 2016-09-04 DIAGNOSIS — M6281 Muscle weakness (generalized): Secondary | ICD-10-CM | POA: Diagnosis not present

## 2016-09-04 DIAGNOSIS — I69398 Other sequelae of cerebral infarction: Secondary | ICD-10-CM | POA: Diagnosis not present

## 2016-09-05 DIAGNOSIS — M6281 Muscle weakness (generalized): Secondary | ICD-10-CM | POA: Diagnosis not present

## 2016-09-05 DIAGNOSIS — I69398 Other sequelae of cerebral infarction: Secondary | ICD-10-CM | POA: Diagnosis not present

## 2016-09-10 DIAGNOSIS — I69398 Other sequelae of cerebral infarction: Secondary | ICD-10-CM | POA: Diagnosis not present

## 2016-09-10 DIAGNOSIS — M6281 Muscle weakness (generalized): Secondary | ICD-10-CM | POA: Diagnosis not present

## 2016-09-12 DIAGNOSIS — I69398 Other sequelae of cerebral infarction: Secondary | ICD-10-CM | POA: Diagnosis not present

## 2016-09-12 DIAGNOSIS — M6281 Muscle weakness (generalized): Secondary | ICD-10-CM | POA: Diagnosis not present

## 2016-09-13 ENCOUNTER — Encounter (HOSPITAL_COMMUNITY): Payer: Self-pay | Admitting: Emergency Medicine

## 2016-09-13 ENCOUNTER — Emergency Department (HOSPITAL_COMMUNITY)
Admission: EM | Admit: 2016-09-13 | Discharge: 2016-09-13 | Disposition: A | Discharge status: Home / Self Care | Payer: Medicare Other | Attending: Emergency Medicine | Admitting: Emergency Medicine

## 2016-09-13 ENCOUNTER — Emergency Department (HOSPITAL_COMMUNITY): Payer: Medicare Other

## 2016-09-13 DIAGNOSIS — R0602 Shortness of breath: Secondary | ICD-10-CM | POA: Insufficient documentation

## 2016-09-13 DIAGNOSIS — Z8673 Personal history of transient ischemic attack (TIA), and cerebral infarction without residual deficits: Secondary | ICD-10-CM | POA: Diagnosis not present

## 2016-09-13 DIAGNOSIS — Z955 Presence of coronary angioplasty implant and graft: Secondary | ICD-10-CM | POA: Diagnosis not present

## 2016-09-13 DIAGNOSIS — Z87891 Personal history of nicotine dependence: Secondary | ICD-10-CM | POA: Insufficient documentation

## 2016-09-13 DIAGNOSIS — I130 Hypertensive heart and chronic kidney disease with heart failure and stage 1 through stage 4 chronic kidney disease, or unspecified chronic kidney disease: Secondary | ICD-10-CM | POA: Insufficient documentation

## 2016-09-13 DIAGNOSIS — I509 Heart failure, unspecified: Secondary | ICD-10-CM | POA: Diagnosis not present

## 2016-09-13 DIAGNOSIS — Z7982 Long term (current) use of aspirin: Secondary | ICD-10-CM | POA: Insufficient documentation

## 2016-09-13 DIAGNOSIS — J989 Respiratory disorder, unspecified: Secondary | ICD-10-CM | POA: Diagnosis not present

## 2016-09-13 DIAGNOSIS — N183 Chronic kidney disease, stage 3 (moderate): Secondary | ICD-10-CM | POA: Insufficient documentation

## 2016-09-13 DIAGNOSIS — I2510 Atherosclerotic heart disease of native coronary artery without angina pectoris: Secondary | ICD-10-CM | POA: Insufficient documentation

## 2016-09-13 DIAGNOSIS — Z79899 Other long term (current) drug therapy: Secondary | ICD-10-CM | POA: Diagnosis not present

## 2016-09-13 LAB — BASIC METABOLIC PANEL
Anion gap: 6 (ref 5–15)
BUN: 31 mg/dL — AB (ref 6–20)
CALCIUM: 9.3 mg/dL (ref 8.9–10.3)
CHLORIDE: 112 mmol/L — AB (ref 101–111)
CO2: 21 mmol/L — AB (ref 22–32)
CREATININE: 1.05 mg/dL — AB (ref 0.44–1.00)
GFR calc non Af Amer: 47 mL/min — ABNORMAL LOW (ref >60)
GFR, EST AFRICAN AMERICAN: 55 mL/min — AB (ref >60)
GLUCOSE: 85 mg/dL (ref 65–99)
Potassium: 4.1 mmol/L (ref 3.5–5.1)
Sodium: 139 mmol/L (ref 135–145)

## 2016-09-13 LAB — TROPONIN I

## 2016-09-13 LAB — CBC WITH DIFFERENTIAL/PLATELET
BASOS PCT: 0 %
Basophils Absolute: 0 10*3/uL (ref 0.0–0.1)
EOS ABS: 0 10*3/uL (ref 0.0–0.7)
Eosinophils Relative: 0 %
HCT: 27.6 % — ABNORMAL LOW (ref 36.0–46.0)
HEMOGLOBIN: 9.6 g/dL — AB (ref 12.0–15.0)
LYMPHS ABS: 1.5 10*3/uL (ref 0.7–4.0)
Lymphocytes Relative: 47 %
MCH: 32.4 pg (ref 26.0–34.0)
MCHC: 34.8 g/dL (ref 30.0–36.0)
MCV: 93.2 fL (ref 78.0–100.0)
MONOS PCT: 8 %
Monocytes Absolute: 0.2 10*3/uL (ref 0.1–1.0)
NEUTROS PCT: 45 %
Neutro Abs: 1.4 10*3/uL — ABNORMAL LOW (ref 1.7–7.7)
Platelets: 92 10*3/uL — ABNORMAL LOW (ref 150–400)
RBC: 2.96 MIL/uL — ABNORMAL LOW (ref 3.87–5.11)
RDW: 13.3 % (ref 11.5–15.5)
WBC: 3.2 10*3/uL — ABNORMAL LOW (ref 4.0–10.5)

## 2016-09-13 LAB — BRAIN NATRIURETIC PEPTIDE: B NATRIURETIC PEPTIDE 5: 223.2 pg/mL — AB (ref 0.0–100.0)

## 2016-09-13 MED ORDER — ALBUTEROL SULFATE (2.5 MG/3ML) 0.083% IN NEBU
5.0000 mg | INHALATION_SOLUTION | Freq: Once | RESPIRATORY_TRACT | Status: AC
  Administered 2016-09-13: 5 mg via RESPIRATORY_TRACT
  Filled 2016-09-13: qty 6

## 2016-09-13 MED ORDER — GI COCKTAIL ~~LOC~~
30.0000 mL | Freq: Once | ORAL | Status: AC
  Administered 2016-09-13: 30 mL via ORAL
  Filled 2016-09-13: qty 30

## 2016-09-13 NOTE — ED Triage Notes (Signed)
Pt comes to ed via ems, c/o SOB startinng 45 mins ago at home on ambulation to the bathroom. Hx of CHF, and stent placement, and HTN. Pt is alertx4 / ambulatory with walker. V/s on 140/80, hr 74, rr18, 99 room air.   normal sinus on monitor.

## 2016-09-13 NOTE — ED Notes (Signed)
ED Provider at bedside. 

## 2016-09-13 NOTE — ED Notes (Signed)
[facility] called for transport.  

## 2016-09-13 NOTE — ED Notes (Signed)
Patient transported to X-ray 

## 2016-09-13 NOTE — ED Provider Notes (Signed)
[place] DEPT Provider Note: Georgena Spurling, MD, FACEP  CSN: [id number] MRN: [id number] ARRIVAL: 09/13/16 at [place]: [id number]/WA09   CHIEF COMPLAINT  Shortness of Breath (started 35 mis ago ( coming from home) )   Jane [name]  Jane [name] is a 83 y.o. female who had the fairly sudden onset of shortness of breath about 45 minutes prior to arrival. The onset occurred while she was ambulating to the bathroom. She has had similar episodes in the past. Her symptoms were not severe but she became concerned and called EMS. She has known stable congestive heart failure and coronary artery disease. She has been prescribed albuterol in the past. She denies associated chest pain, cough, fever, nausea or vomiting. On arrival she was examined by her nurse and noted to be in no acute distress. Her oxygen saturation was 99% on room air. She had some mild wheezing and was given an albuterol neb treatment with improvement in her dyspnea. She is now comfortable without dyspnea or tachypnea (RR 13).   Past Medical History:  Diagnosis Date  . Anemia   . Aortic insufficiency    mild  . Arthritis    "arms" (08/09/2015)  . CAD (coronary artery disease)    cath 08/09/2014 95% stenosis in prox to mid RCA s/p DES, 80-90% prox OM2, 50% distal LAD  . CHF (congestive heart failure) ([place])   . CKD (chronic kidney disease) stage 3, GFR 30-59 ml/min   . Colon polyp    2009 colonoscopy, not retrieved for pathology  . Ectropion of left lower eyelid   . GERD (gastroesophageal reflux disease) 2009   EGD with benign gastric polyp too  . Hyperlipidemia   . Hypertension   . Stroke ([place]) 1975  . Vitamin D deficiency     Past Surgical History:  Procedure Laterality Date  . ABDOMINAL HYSTERECTOMY    . APPENDECTOMY    . ARTERY BIOPSY Left 12/16/2012   Procedure: BIOPSY TEMPORAL ARTERY;  Surgeon: Mal Misty, MD;  [facility];  Service: Vascular;  Laterality: Left;  . CARDIAC CATHETERIZATION     . LEFT HEART CATHETERIZATION WITH CORONARY ANGIOGRAM N/A 08/09/2014   Procedure: LEFT HEART CATHETERIZATION WITH CORONARY ANGIOGRAM;  Surgeon: Peter M Martinique, MD;  Location: [facility] CATH LAB;  Service: Cardiovascular;  Laterality: N/A;  . PERCUTANEOUS CORONARY STENT INTERVENTION (PCI-S)  08/09/2014   Procedure: PERCUTANEOUS CORONARY STENT INTERVENTION (PCI-S);  Surgeon: Peter M Martinique, MD;  Location: [facility] CATH LAB;  Service: Cardiovascular;;  . TONSILLECTOMY      Family History  Problem Relation Age of Onset  . Stroke Mother   . Hypertension Mother   . Stroke Father   . Hypertension Father   . Diabetes Sister     Social History  Substance Use Topics  . Smoking status: Former Smoker    Types: Cigarettes    Quit date: 07/02/1979  . Smokeless tobacco: Former Systems developer     Comment: Started in teenage years - [place]  . Alcohol use No     Comment: Quit alcohol 1975-76    Prior to Admission medications   Medication Sig Start Date End Date Taking? Authorizing Provider  acetaminophen (TYLENOL) 325 MG tablet Take 650 mg by mouth every 6 (six) hours as needed for mild pain, moderate pain or headache.    Yes Historical Provider, MD  albuterol (PROAIR HFA) 108 (90 Base) MCG/ACT inhaler Inhale 1-2 puffs into the lungs every 4 (four) hours as needed for  wheezing or shortness of breath. 03/12/16  Yes Milagros Loll, MD  aspirin EC 81 MG tablet Take 81 mg by mouth daily.   Yes Historical Provider, MD  atorvastatin (LIPITOR) 40 MG tablet Take 1 tablet (40 mg total) by mouth daily at 6 PM. 08/15/16  Yes Milagros Loll, MD  bisoprolol (ZEBETA) 5 MG tablet Take 0.5 tablets (2.5 mg total) by mouth daily. 08/15/16  Yes Milagros Loll, MD  furosemide (LASIX) 40 MG tablet Take 1 tablet (40 mg total) by mouth daily. 08/15/16  Yes Milagros Loll, MD  isosorbide mononitrate (IMDUR) 30 MG 24 hr tablet Take 1 tablet (30 mg total) by mouth daily. 08/15/16  Yes Milagros Loll, MD  losartan (COZAAR) 50 MG tablet take 1  tablet by mouth once daily 06/07/16  Yes Milagros Loll, MD  meloxicam (MOBIC) 7.5 MG tablet Take 1 tablet (7.5 mg total) by mouth daily. 08/15/16  Yes Milagros Loll, MD  Propylene Glycol-Glycerin (ARTIFICIAL TEARS) 1-0.3 % SOLN Apply 1-2 drops to eye daily as needed (dry eyes). 05/21/16  Yes Asencion Partridge, MD  vitamin B-12 (CYANOCOBALAMIN) 1000 MCG tablet Take 1 tablet (1,000 mcg total) by mouth daily. 08/16/16  Yes Milagros Loll, MD    Allergies Patient has no known allergies.   REVIEW OF SYSTEMS  Negative except as noted here or in the History of Present Illness.   PHYSICAL EXAMINATION  Initial Vital Signs Blood pressure (!) 150/67, pulse 68, temperature 98.8 F (37.1 C), temperature source Oral, resp. rate 19, SpO2 100 %.  Examination General: Well-developed, well-nourished female in no acute distress; appearance consistent with age of record HENT: normocephalic; atraumatic; edentulous Eyes: pupils equal, round and reactive to light; extraocular muscles intact; ectropion of left lower eyelid Neck: supple Heart: regular rate and rhythm Lungs: clear to auscultation bilaterally Abdomen: soft; nondistended; nontender; no masses or hepatosplenomegaly; bowel sounds present Extremities: No deformity; full range of motion; pulses normal; excessive length of toenails Neurologic: Awake, alert and oriented; motor function intact in all extremities and symmetric; no facial droop Skin: Warm and dry Psychiatric: Flat affect   RESULTS  Summary of this visit's results, reviewed by myself:   EKG Interpretation  Date/Time:  Friday September 13 2016 04:37:53 EDT Ventricular Rate:  61 PR Interval:    QRS Duration: 104 QT Interval:  433 QTC Calculation: 437 R Axis:   5 Text Interpretation:  Sinus rhythm Probable left atrial enlargement Low voltage, precordial leads Rate is slower Confirmed by Erskin Zinda  MD, Jenny Reichmann ([id number]) on 09/13/2016 4:40:55 AM      Laboratory Studies: Results for orders  placed or performed during the hospital encounter of 09/13/16 (from the past 24 hour(s))  Basic metabolic panel     Status: Abnormal   Collection Time: 09/13/16  4:49 AM  Result Value Ref Range   Sodium 139 135 - 145 mmol/L   Potassium 4.1 3.5 - 5.1 mmol/L   Chloride 112 (H) 101 - 111 mmol/L   CO2 21 (L) 22 - 32 mmol/L   Glucose, Bld 85 65 - 99 mg/dL   BUN 31 (H) 6 - 20 mg/dL   Creatinine, Ser 1.05 (H) 0.44 - 1.00 mg/dL   Calcium 9.3 8.9 - 10.3 mg/dL   GFR calc non Af Amer 47 (L) >60 mL/min   GFR calc Af Amer 55 (L) >60 mL/min   Anion gap 6 5 - 15  CBC with Differential     Status: Abnormal   Collection  Time: 09/13/16  4:49 AM  Result Value Ref Range   WBC 3.2 (L) 4.0 - 10.5 K/uL   RBC 2.96 (L) 3.87 - 5.11 MIL/uL   Hemoglobin 9.6 (L) 12.0 - 15.0 g/dL   HCT 27.6 (L) 36.0 - 46.0 %   MCV 93.2 78.0 - 100.0 fL   MCH 32.4 26.0 - 34.0 pg   MCHC 34.8 30.0 - 36.0 g/dL   RDW 13.3 11.5 - 15.5 %   Platelets 92 (L) 150 - 400 K/uL   Neutrophils Relative % 45 %   Neutro Abs 1.4 (L) 1.7 - 7.7 K/uL   Lymphocytes Relative 47 %   Lymphs Abs 1.5 0.7 - 4.0 K/uL   Monocytes Relative 8 %   Monocytes Absolute 0.2 0.1 - 1.0 K/uL   Eosinophils Relative 0 %   Eosinophils Absolute 0.0 0.0 - 0.7 K/uL   Basophils Relative 0 %   Basophils Absolute 0.0 0.0 - 0.1 K/uL  Troponin I     Status: None   Collection Time: 09/13/16  4:49 AM  Result Value Ref Range   Troponin I <0.03 <0.03 ng/mL  Brain natriuretic peptide     Status: Abnormal   Collection Time: 09/13/16  4:49 AM  Result Value Ref Range   B Natriuretic Peptide 223.2 (H) 0.0 - 100.0 pg/mL   Imaging Studies: Dg Chest 2 View  Result Date: 09/13/2016 CLINICAL DATA:  Shortness of breath walking to bathroom this morning. History of CHF. EXAM: CHEST  2 VIEW COMPARISON:  Chest radiograph August 11, 2016 FINDINGS: The cardiac silhouette is mildly enlarged, unchanged. Calcified aortic knob. Similar pulmonary vascular congestion, decreased interstitial  edema without pleural effusion or focal consolidation. Increased lung volumes with flattened hemidiaphragms. No pneumothorax. Osteopenia. High-riding RIGHT humeral head compatible with old rotator cuff injury. IMPRESSION: Stable cardiomegaly and pulmonary vascular congestion.  COPD. Electronically Signed   By: Elon Alas M.D.   On: 09/13/2016 04:33    ED COURSE  Nursing notes and initial vitals signs, including pulse oximetry, reviewed.  Vitals:   09/13/16 0401  BP: (!) 150/67  Pulse: 68  Resp: 19  Temp: 98.8 F (37.1 C)  TempSrc: Oral  SpO2: 100%   5:47 AM Laboratory eyes are within the patient's usual ranges. Patient's creatinine is noted to be improved from a month ago.  PROCEDURES    ED DIAGNOSES     ICD-9-CM ICD-10-CM   1. Shortness of breath 786.05 R06.02        Shanon Rosser, MD 09/13/16 [phone]

## 2016-09-13 NOTE — ED Notes (Signed)
Bed: WA09 Expected date:  Expected time:  Means of arrival:  Comments: EMS-SOB 

## 2016-09-16 DIAGNOSIS — M6281 Muscle weakness (generalized): Secondary | ICD-10-CM | POA: Diagnosis not present

## 2016-09-16 DIAGNOSIS — I69398 Other sequelae of cerebral infarction: Secondary | ICD-10-CM | POA: Diagnosis not present

## 2016-09-17 ENCOUNTER — Emergency Department (HOSPITAL_COMMUNITY)
Admission: EM | Admit: 2016-09-17 | Discharge: 2016-09-17 | Disposition: A | Discharge status: Home / Self Care | Payer: Medicare Other | Attending: Emergency Medicine | Admitting: Emergency Medicine

## 2016-09-17 ENCOUNTER — Emergency Department (HOSPITAL_COMMUNITY): Payer: Medicare Other

## 2016-09-17 ENCOUNTER — Encounter (HOSPITAL_COMMUNITY): Payer: Self-pay | Admitting: Emergency Medicine

## 2016-09-17 DIAGNOSIS — I509 Heart failure, unspecified: Secondary | ICD-10-CM | POA: Diagnosis not present

## 2016-09-17 DIAGNOSIS — R0602 Shortness of breath: Secondary | ICD-10-CM

## 2016-09-17 DIAGNOSIS — I130 Hypertensive heart and chronic kidney disease with heart failure and stage 1 through stage 4 chronic kidney disease, or unspecified chronic kidney disease: Secondary | ICD-10-CM | POA: Diagnosis not present

## 2016-09-17 DIAGNOSIS — R791 Abnormal coagulation profile: Secondary | ICD-10-CM | POA: Insufficient documentation

## 2016-09-17 DIAGNOSIS — D61818 Other pancytopenia: Secondary | ICD-10-CM

## 2016-09-17 DIAGNOSIS — Z7982 Long term (current) use of aspirin: Secondary | ICD-10-CM | POA: Insufficient documentation

## 2016-09-17 DIAGNOSIS — I2510 Atherosclerotic heart disease of native coronary artery without angina pectoris: Secondary | ICD-10-CM | POA: Diagnosis not present

## 2016-09-17 DIAGNOSIS — Z87891 Personal history of nicotine dependence: Secondary | ICD-10-CM | POA: Diagnosis not present

## 2016-09-17 DIAGNOSIS — I5032 Chronic diastolic (congestive) heart failure: Secondary | ICD-10-CM | POA: Diagnosis not present

## 2016-09-17 DIAGNOSIS — N183 Chronic kidney disease, stage 3 (moderate): Secondary | ICD-10-CM | POA: Insufficient documentation

## 2016-09-17 DIAGNOSIS — Z8673 Personal history of transient ischemic attack (TIA), and cerebral infarction without residual deficits: Secondary | ICD-10-CM | POA: Insufficient documentation

## 2016-09-17 DIAGNOSIS — R7989 Other specified abnormal findings of blood chemistry: Secondary | ICD-10-CM

## 2016-09-17 DIAGNOSIS — J80 Acute respiratory distress syndrome: Secondary | ICD-10-CM | POA: Diagnosis not present

## 2016-09-17 LAB — BASIC METABOLIC PANEL
ANION GAP: 9 (ref 5–15)
BUN: 25 mg/dL — ABNORMAL HIGH (ref 6–20)
CHLORIDE: 114 mmol/L — AB (ref 101–111)
CO2: 21 mmol/L — ABNORMAL LOW (ref 22–32)
Calcium: 9 mg/dL (ref 8.9–10.3)
Creatinine, Ser: 1.14 mg/dL — ABNORMAL HIGH (ref 0.44–1.00)
GFR calc Af Amer: 49 mL/min — ABNORMAL LOW (ref >60)
GFR, EST NON AFRICAN AMERICAN: 43 mL/min — AB (ref >60)
GLUCOSE: 85 mg/dL (ref 65–99)
POTASSIUM: 3.6 mmol/L (ref 3.5–5.1)
Sodium: 144 mmol/L (ref 135–145)

## 2016-09-17 LAB — CBC
HEMATOCRIT: 28.5 % — AB (ref 36.0–46.0)
HEMOGLOBIN: 9.5 g/dL — AB (ref 12.0–15.0)
MCH: 31.6 pg (ref 26.0–34.0)
MCHC: 33.3 g/dL (ref 30.0–36.0)
MCV: 94.7 fL (ref 78.0–100.0)
Platelets: 95 10*3/uL — ABNORMAL LOW (ref 150–400)
RBC: 3.01 MIL/uL — ABNORMAL LOW (ref 3.87–5.11)
RDW: 13.4 % (ref 11.5–15.5)
WBC: 3.2 10*3/uL — ABNORMAL LOW (ref 4.0–10.5)

## 2016-09-17 LAB — DIFFERENTIAL
Basophils Absolute: 0 10*3/uL (ref 0.0–0.1)
Basophils Relative: 0 %
Eosinophils Absolute: 0 10*3/uL (ref 0.0–0.7)
Eosinophils Relative: 0 %
LYMPHS PCT: 44 %
Lymphs Abs: 1.4 10*3/uL (ref 0.7–4.0)
MONO ABS: 0.3 10*3/uL (ref 0.1–1.0)
MONOS PCT: 9 %
Neutro Abs: 1.5 10*3/uL — ABNORMAL LOW (ref 1.7–7.7)
Neutrophils Relative %: 47 %

## 2016-09-17 LAB — BRAIN NATRIURETIC PEPTIDE: B Natriuretic Peptide: 258 pg/mL — ABNORMAL HIGH (ref 0.0–100.0)

## 2016-09-17 LAB — I-STAT TROPONIN, ED: TROPONIN I, POC: 0.01 ng/mL (ref 0.00–0.08)

## 2016-09-17 LAB — D-DIMER, QUANTITATIVE (NOT AT ARMC): D DIMER QUANT: 1.79 ug{FEU}/mL — AB (ref 0.00–0.50)

## 2016-09-17 MED ORDER — FUROSEMIDE 10 MG/ML IJ SOLN
40.0000 mg | Freq: Once | INTRAMUSCULAR | Status: AC
  Administered 2016-09-17: 40 mg via INTRAVENOUS
  Filled 2016-09-17: qty 4

## 2016-09-17 MED ORDER — IOPAMIDOL (ISOVUE-370) INJECTION 76%
INTRAVENOUS | Status: AC
  Administered 2016-09-17: 100 mL
  Filled 2016-09-17: qty 100

## 2016-09-17 NOTE — Discharge Instructions (Signed)
As discussed, today's evaluation has been generally reassuring. The next 3 days, please use 60 mg, daily, of your Lasix rather than your typical dose of 40.  Please be sure to follow-up with her physician, and do not hesitate to return here for concerning changes in your condition.

## 2016-09-17 NOTE — ED Triage Notes (Signed)
Pt brough in by EMS for shortness of breath. Pt called 9-1-1 because she felt short of breath. EMS advised pt was in no apparent distress, lung sounds clear, and room air oxygen saturation of 98%. EMS also reports pt used her home albuterol nebulizer.

## 2016-09-17 NOTE — ED Provider Notes (Signed)
[place] DEPT Provider Note   CSN: [id number] Arrival date & time: 09/17/16  0610     History   Chief Complaint Chief Complaint  Patient presents with  . Shortness of Breath    HPI Jane [name] is a 83 y.o. female.  She has history of coronary artery disease and diastolic heart failure. During the night, she developed dyspnea and felt like she had to sit up on the edge of her bed. She denies chest pain, heaviness, tightness, pressure. She exhibits relatively poor insight into her condition-she states that, "they tell me to come in whenever this happens." She denies fever, chills, sweats. She denies cough.   The history is provided by the patient.  Shortness of Breath     Past Medical History:  Diagnosis Date  . Anemia   . Aortic insufficiency    mild  . Arthritis    "arms" (08/09/2015)  . CAD (coronary artery disease)    cath 08/09/2014 95% stenosis in prox to mid RCA s/p DES, 80-90% prox OM2, 50% distal LAD  . CHF (congestive heart failure) ([place])   . CKD (chronic kidney disease) stage 3, GFR 30-59 ml/min   . Colon polyp    2009 colonoscopy, not retrieved for pathology  . Ectropion of left lower eyelid   . GERD (gastroesophageal reflux disease) 2009   EGD with benign gastric polyp too  . Hyperlipidemia   . Hypertension   . Stroke ([place]) 1975  . Vitamin D deficiency     Patient Active Problem List   Diagnosis Date Noted  . Acute on chronic congestive heart failure ([place])   . Muscle weakness (generalized)   . Unsteady gait 08/11/2016  . Low back pain   . Abdominal aortic aneurysm ([place]) 05/21/2016  . Trochanteric bursitis of left hip   . Other pancytopenia ([place]) 12/27/2015  . CKD (chronic kidney disease) stage 3, GFR 30-59 ml/min 03/31/2015  . Prediabetes 10/16/2014  . Status post insertion of drug-eluting stent into right coronary artery for coronary artery disease 08/07/2014  . Seborrheic keratoses 08/05/2014  . Physical deconditioning 05/31/2014  .  Dementia 04/28/2014  . Heart failure with preserved ejection fraction ([place]) 12/27/2012  . Ectropion of left lower eyelid 12/17/2012  . Onychomycosis of toenail 09/18/2012  . PAD (peripheral artery disease) ([place]) 09/18/2012  . Preventative health care 09/18/2012  . Vitamin D deficiency 04/25/2008  . C V A / STROKE 10/29/2007  . Hyperlipidemia 09/24/2006  . Normocytic anemia 09/24/2006  . Essential hypertension 09/24/2006  . OSTEOPENIA 09/24/2006  . Vitamin B12 deficiency 08/29/2006    Past Surgical History:  Procedure Laterality Date  . ABDOMINAL HYSTERECTOMY    . APPENDECTOMY    . ARTERY BIOPSY Left 12/16/2012   Procedure: BIOPSY TEMPORAL ARTERY;  Surgeon: Mal Misty, MD;  Location: [facility];  Service: Vascular;  Laterality: Left;  . CARDIAC CATHETERIZATION    . LEFT HEART CATHETERIZATION WITH CORONARY ANGIOGRAM N/A 08/09/2014   Procedure: LEFT HEART CATHETERIZATION WITH CORONARY ANGIOGRAM;  Surgeon: Peter M Martinique, MD;  Location[facility] C[facility] LAB;  Service: Cardiovascular;  Laterality: N/A;  . PERCUTANEOUS CORONARY STENT INTERVENTION (PCI-S)  08/09/2014   Procedure: PERCUTANEOUS CORONARY STENT INTERVENTION (PCI-S);  Surgeon: Peter M Martinique, MD;  Location: [facility] CATH LAB;  Service: Cardiovascular;;  . TONSILLECTOMY      OB History    No data available       Home Medications    Prior to Admission medications   Medication Sig Start Date  End Date Taking? Authorizing Provider  acetaminophen (TYLENOL) 325 MG tablet Take 650 mg by mouth every 6 (six) hours as needed for mild pain, moderate pain or headache.     Historical Provider, MD  albuterol (PROAIR HFA) 108 (90 Base) MCG/ACT inhaler Inhale 1-2 puffs into the lungs every 4 (four) hours as needed for wheezing or shortness of breath. 03/12/16   Milagros Loll, MD  aspirin EC 81 MG tablet Take 81 mg by mouth daily.    Historical Provider, MD  atorvastatin (LIPITOR) 40 MG tablet Take 1 tablet (40 mg total) by mouth daily at 6 PM. 08/15/16    Milagros Loll, MD  bisoprolol (ZEBETA) 5 MG tablet Take 0.5 tablets (2.5 mg total) by mouth daily. 08/15/16   Milagros Loll, MD  furosemide (LASIX) 40 MG tablet Take 1 tablet (40 mg total) by mouth daily. 08/15/16   Milagros Loll, MD  isosorbide mononitrate (IMDUR) 30 MG 24 hr tablet Take 1 tablet (30 mg total) by mouth daily. 08/15/16   Milagros Loll, MD  losartan (COZAAR) 50 MG tablet take 1 tablet by mouth once daily 06/07/16   Milagros Loll, MD  meloxicam (MOBIC) 7.5 MG tablet Take 1 tablet (7.5 mg total) by mouth daily. 08/15/16   Milagros Loll, MD  Propylene Glycol-Glycerin (ARTIFICIAL TEARS) 1-0.3 % SOLN Apply 1-2 drops to eye daily as needed (dry eyes). 05/21/16   Asencion Partridge, MD  vitamin B-12 (CYANOCOBALAMIN) 1000 MCG tablet Take 1 tablet (1,000 mcg total) by mouth daily. 08/16/16   Milagros Loll, MD    Family History Family History  Problem Relation Age of Onset  . Stroke Mother   . Hypertension Mother   . Stroke Father   . Hypertension Father   . Diabetes Sister     Social History Social History  Substance Use Topics  . Smoking status: Former Smoker    Types: Cigarettes    Quit date: 07/02/1979  . Smokeless tobacco: Former Systems developer     Comment: Started in teenage years - [place]  . Alcohol use No     Comment: Quit alcohol 1975-76     Allergies   Patient has no known allergies.   Review of Systems Review of Systems  Respiratory: Positive for shortness of breath.   All other systems reviewed and are negative.    Physical Exam Updated Vital Signs BP (!) 146/57 (BP Location: Right Arm)   Pulse 74   Temp 98.2 F (36.8 C) (Oral)   Resp 14   Ht 5' 4.5" (1.638 m)   Wt 160 lb (72.6 kg)   SpO2 98%   BMI 27.04 kg/m   Physical Exam  Nursing note and vitals reviewed.  83 year old female, resting comfortably and in no acute distress. Vital signs are Significant for hypertension. Oxygen saturation is 98%, which is normal. Head is normocephalic and  atraumatic. PERRLA, EOMI. Oropharynx is clear. Neck is nontender and supple without adenopathy or JVD. Back is nontender and there is no CVA tenderness. Lungs are clear without rales, wheezes, or rhonchi. Chest is nontender. Heart has regular rate and rhythm without murmur. Abdomen is soft, flat, nontender without masses or hepatosplenomegaly and peristalsis is normoactive. Extremities have no cyanosis or edema, full range of motion is present. Skin is warm and dry without rash. Neurologic: Mental status is normal, cranial nerves are intact, there are no motor or sensory deficits.  ED Treatments / Results  Labs (all labs ordered  are listed, but only abnormal results are displayed) Labs Reviewed  BASIC METABOLIC PANEL - Abnormal; Notable for the following:       Result Value   Chloride 114 (*)    CO2 21 (*)    BUN 25 (*)    Creatinine, Ser 1.14 (*)    GFR calc non Af Amer 43 (*)    GFR calc Af Amer 49 (*)    All other components within normal limits  CBC - Abnormal; Notable for the following:    WBC 3.2 (*)    RBC 3.01 (*)    Hemoglobin 9.5 (*)    HCT 28.5 (*)    Platelets 95 (*)    All other components within normal limits  DIFFERENTIAL - Abnormal; Notable for the following:    Neutro Abs 1.5 (*)    All other components within normal limits  BRAIN NATRIURETIC PEPTIDE - Abnormal; Notable for the following:    B Natriuretic Peptide 258.0 (*)    All other components within normal limits  D-DIMER, QUANTITATIVE (NOT AT [facility]) - Abnormal; Notable for the following:    D-Dimer, Quant 1.79 (*)    All other components within normal limits  I-STAT TROPOININ, ED    EKG  EKG Interpretation  Date/Time:  Tuesday September 17 2016 06:12:14 EDT Ventricular Rate:  75 PR Interval:    QRS Duration: 112 QT Interval:  394 QTC Calculation: 441 R Axis:   21 Text Interpretation:  Sinus rhythm Borderline intraventricular conduction delay Low voltage, precordial leads Abnormal R-wave  progression, early transition When compared with ECG of 09/13/2016, No significant change was found Confirmed by [facility]  MD, Cloyd Ragas ([id number]) on 09/17/2016 6:15:43 AM       Radiology Dg Chest 2 View  Result Date: 09/17/2016 CLINICAL DATA:  Shortness of breath EXAM: CHEST  2 VIEW COMPARISON:  chest radiograph 09/13/2016. FINDINGS: Unchanged cardiomegaly and calcific aortic atherosclerosis. There is no focal airspace consolidation or pulmonary edema. No pleural effusion or pneumothorax. IMPRESSION: Unchanged cardiomegaly and aortic atherosclerosis without focal airspace disease. Electronically Signed   By: Ulyses Jarred M.D.   On: 09/17/2016 06:50    Procedures Procedures (including critical care time)  Medications Ordered in ED Medications  furosemide (LASIX) injection 40 mg (not administered)     Initial Impression / Assessment and Plan / ED Course  I have reviewed the triage vital signs and the nursing notes.  Pertinent labs & imaging results that were available during my care of the patient were reviewed by me and considered in my medical decision making (see chart for details).  Dyspnea of uncertain cause. She does have known history of CHF. Chest x-ray is an ECG are unchanged from baseline. Her exam is unremarkable. We'll check troponin and BNP and will also check d-dimer. Old records are reviewed, and she does have prior hospitalizations for diastolic heart failure exacerbation.  Laboratory workup shows pancytopenia which is not significant changed from baseline. Borderline renal insufficiency is also not changed from baseline. BNP is mildly elevated and not significantly changed from ED visit from 2 weeks ago. D-dimer is elevated. Prior d-dimer 7 elevated, but none have been obtained recently. She is sent for CT angiogram of the chest rule out pulmonary embolism. However, I suspect that this is a mild CHF exacerbation and she is given a dose of furosemide. Case is signed out to Dr.  Vanita Panda.  Final Clinical Impressions(s) / ED Diagnoses   Final diagnoses:  Shortness of breath  Pancytopenia (  [place])  Elevated d-dimer  Chronic diastolic heart failure [facility])    New Prescriptions New Prescriptions   No medications on file     Delora Fuel, MD 67/67/20 [id number]

## 2016-09-17 NOTE — ED Provider Notes (Signed)
I reviewed the CT imaging with the patient. Vital signs unremarkable, normal blood pressure, heart rate, pulse oxygen saturation. We discussed the patient's other findings, including consistent lab values with prior studies. With reassuring findings, per sign-out instruction, the patient will be discharged to follow-up with primary care.    Carmin Muskrat, MD 09/17/16 1052

## 2016-09-17 NOTE — ED Notes (Signed)
Pt used bedside toilet, tolerated well. 126ml output.

## 2016-09-17 NOTE — ED Notes (Signed)
Wheeled pt to restroom from room. 

## 2016-09-19 ENCOUNTER — Ambulatory Visit: Payer: Self-pay

## 2016-09-25 ENCOUNTER — Ambulatory Visit: Payer: Self-pay

## 2016-09-26 DIAGNOSIS — I69398 Other sequelae of cerebral infarction: Secondary | ICD-10-CM | POA: Diagnosis not present

## 2016-09-26 DIAGNOSIS — M6281 Muscle weakness (generalized): Secondary | ICD-10-CM | POA: Diagnosis not present

## 2016-10-01 DIAGNOSIS — I69398 Other sequelae of cerebral infarction: Secondary | ICD-10-CM | POA: Diagnosis not present

## 2016-10-01 DIAGNOSIS — M6281 Muscle weakness (generalized): Secondary | ICD-10-CM | POA: Diagnosis not present

## 2016-10-02 ENCOUNTER — Ambulatory Visit (INDEPENDENT_AMBULATORY_CARE_PROVIDER_SITE_OTHER): Payer: Medicare Other | Admitting: Internal Medicine

## 2016-10-02 DIAGNOSIS — E538 Deficiency of other specified B group vitamins: Secondary | ICD-10-CM | POA: Diagnosis not present

## 2016-10-02 DIAGNOSIS — Z87891 Personal history of nicotine dependence: Secondary | ICD-10-CM

## 2016-10-02 DIAGNOSIS — R0600 Dyspnea, unspecified: Secondary | ICD-10-CM | POA: Diagnosis not present

## 2016-10-02 NOTE — Assessment & Plan Note (Addendum)
Reports compliance with her B12 supplement.   Will recheck levels today.

## 2016-10-02 NOTE — Patient Instructions (Signed)
Jane [name],  It was a pleasure meeting you today. Everything looks good today. I am going to check your B12 level today and see how that looks after starting the supplement. Your potassium levels have been good off off the potassium supplement so I do no think we need to continue that for now. If you continue to have problems with shortness of breath please call the clinic and schedule an appointment to be seen.   Please follow up with Dr. Randell Patient in June.

## 2016-10-02 NOTE — Progress Notes (Signed)
   CC: ED follow up for HFpEF exacerbation  HPI:  [name] is a 83 y.o. female with a past medical history listed below here today for follow up of her acute exacerbation of her HFpEF.   For details of today's visit and the status of her chronic medical issues please refer to the assessment and plan.  Past Medical History:  Diagnosis Date  . Anemia   . Aortic insufficiency    mild  . Arthritis    "arms" (08/09/2015)  . CAD (coronary artery disease)    cath 08/09/2014 95% stenosis in prox to mid RCA s/p DES, 80-90% prox OM2, 50% distal LAD  . CHF (congestive heart failure) ([place])   . CKD (chronic kidney disease) stage 3, GFR 30-59 ml/min   . Colon polyp    2009 colonoscopy, not retrieved for pathology  . Ectropion of left lower eyelid   . GERD (gastroesophageal reflux disease) 2009   EGD with benign gastric polyp too  . Hyperlipidemia   . Hypertension   . Stroke ([place]) 1975  . Vitamin D deficiency    Review of Systems:   See HPI  Physical Exam:  Vitals:   10/02/16 1106  BP: (!) 130/51  Pulse: 67  Temp: 98 F (36.7 C)  TempSrc: Oral  SpO2: 99%  Weight: 159 lb 11.2 oz (72.4 kg)   Physical Exam  Constitutional: She is well-developed, well-nourished, and in no distress.  Neck: No JVD present.  Cardiovascular: Normal rate, regular rhythm and normal heart sounds.   Pulmonary/Chest: Effort normal and breath sounds normal. She has no wheezes. She has no rales.  Abdominal: Soft. Bowel sounds are normal.  Musculoskeletal: She exhibits no edema.  Skin: Skin is warm and dry.  Vitals reviewed.   Assessment & Plan:   See Encounters Tab for problem based charting.  Patient discussed with Dr. Eppie Gibson

## 2016-10-02 NOTE — Assessment & Plan Note (Signed)
  [name] was recently seen twice in the ED for complaints of shortness of breath. Her first visit on 3/16 she had acute onset dyspnea while ambulating to the bathroom. She denied any associated symptoms at that time with no chest pain, cough, fever, chills, nausea or vomiting. Appeared to be in no distress with good oxygen saturation. What noted to have wheezing on exam and given treatment with albuterol. Symptoms improved following the albuterol and she was discharged home. She returned to the ED on 3/20 with further complaints of dyspnea. This time she woke up at night with shortness of breath and had to sit up to  catch her breath. Again denied any chest pain/pressure, fever, chills, cough at that time. CXR was obtained and personally review by myself today which does not show any new changes from her baseline with no evidence of pleural effusion or focal consolidation. BNP was mildly elevated but  Is chronically elevated and was in line with her baseline over the past several months. Did not appear volume overloaded by the EDP exam at hat time with no peripheral edema, JVD or crackles. No wheezing noted at that time. CTA was obtained which had no evidence of PE or acute process.  Today, she reports no complaints. Denies any chest pain, shortness of breath, lower extremity edema. She does report that if she 'gets in a hurry' she gets short of breath.  Exam is unremarkable with no signs of volume overload.   Assessment: Dyspnea  Plan: Unclear what is causing her episodes of dyspnea. Does not appear to have any volume overload and does not appear to have had any signs of volume overload during ED visits. Given her cardiac history some concern for possible angina equivalent but negative troponin during episodes with reassuring EKGs.  Continue current medications today. If symptoms persist would consider repeat ECHO to assess. Follow up with PCP in 2 months.

## 2016-10-03 LAB — VITAMIN B12: Vitamin B-12: 188 pg/mL — ABNORMAL LOW (ref 232–1245)

## 2016-10-03 NOTE — Progress Notes (Signed)
Case discussed with Dr. Boswell at the time of the visit. We reviewed the resident's history and exam and pertinent patient test results. I agree with the assessment, diagnosis, and plan of care documented in the resident's note. 

## 2016-10-10 ENCOUNTER — Other Ambulatory Visit: Payer: Self-pay | Admitting: Internal Medicine

## 2016-10-10 DIAGNOSIS — L600 Ingrowing nail: Secondary | ICD-10-CM

## 2016-10-10 NOTE — Progress Notes (Unsigned)
Patient needs referral for ingrown toenail.

## 2016-10-22 ENCOUNTER — Emergency Department (HOSPITAL_COMMUNITY)
Admission: EM | Admit: 2016-10-22 | Discharge: 2016-10-22 | Disposition: A | Discharge status: Home / Self Care | Payer: Medicare Other | Attending: Emergency Medicine | Admitting: Emergency Medicine

## 2016-10-22 ENCOUNTER — Emergency Department (HOSPITAL_COMMUNITY): Payer: Medicare Other

## 2016-10-22 ENCOUNTER — Encounter (HOSPITAL_COMMUNITY): Payer: Self-pay | Admitting: Emergency Medicine

## 2016-10-22 DIAGNOSIS — I130 Hypertensive heart and chronic kidney disease with heart failure and stage 1 through stage 4 chronic kidney disease, or unspecified chronic kidney disease: Secondary | ICD-10-CM | POA: Diagnosis not present

## 2016-10-22 DIAGNOSIS — N183 Chronic kidney disease, stage 3 (moderate): Secondary | ICD-10-CM | POA: Insufficient documentation

## 2016-10-22 DIAGNOSIS — R0602 Shortness of breath: Secondary | ICD-10-CM

## 2016-10-22 DIAGNOSIS — Z7982 Long term (current) use of aspirin: Secondary | ICD-10-CM | POA: Insufficient documentation

## 2016-10-22 DIAGNOSIS — Z8673 Personal history of transient ischemic attack (TIA), and cerebral infarction without residual deficits: Secondary | ICD-10-CM | POA: Diagnosis not present

## 2016-10-22 DIAGNOSIS — R2689 Other abnormalities of gait and mobility: Secondary | ICD-10-CM | POA: Diagnosis not present

## 2016-10-22 DIAGNOSIS — Z79899 Other long term (current) drug therapy: Secondary | ICD-10-CM | POA: Insufficient documentation

## 2016-10-22 DIAGNOSIS — I509 Heart failure, unspecified: Secondary | ICD-10-CM | POA: Insufficient documentation

## 2016-10-22 DIAGNOSIS — J80 Acute respiratory distress syndrome: Secondary | ICD-10-CM | POA: Diagnosis not present

## 2016-10-22 DIAGNOSIS — Z87891 Personal history of nicotine dependence: Secondary | ICD-10-CM | POA: Diagnosis not present

## 2016-10-22 DIAGNOSIS — I2510 Atherosclerotic heart disease of native coronary artery without angina pectoris: Secondary | ICD-10-CM | POA: Insufficient documentation

## 2016-10-22 LAB — TROPONIN I: Troponin I: <0.03 ng/mL (ref <0.03)

## 2016-10-22 LAB — BASIC METABOLIC PANEL
ANION GAP: 9 (ref 5–15)
BUN: 25 mg/dL — ABNORMAL HIGH (ref 6–20)
CALCIUM: 8.8 mg/dL — AB (ref 8.9–10.3)
CO2: 22 mmol/L (ref 22–32)
Chloride: 112 mmol/L — ABNORMAL HIGH (ref 101–111)
Creatinine, Ser: 1.09 mg/dL — ABNORMAL HIGH (ref 0.44–1.00)
GFR, EST AFRICAN AMERICAN: 52 mL/min — AB (ref >60)
GFR, EST NON AFRICAN AMERICAN: 45 mL/min — AB (ref >60)
Glucose, Bld: 84 mg/dL (ref 65–99)
POTASSIUM: 3.5 mmol/L (ref 3.5–5.1)
Sodium: 143 mmol/L (ref 135–145)

## 2016-10-22 LAB — CBC
HEMATOCRIT: 28.1 % — AB (ref 36.0–46.0)
Hemoglobin: 9.5 g/dL — ABNORMAL LOW (ref 12.0–15.0)
MCH: 32.2 pg (ref 26.0–34.0)
MCHC: 33.8 g/dL (ref 30.0–36.0)
MCV: 95.3 fL (ref 78.0–100.0)
PLATELETS: 99 10*3/uL — AB (ref 150–400)
RBC: 2.95 MIL/uL — AB (ref 3.87–5.11)
RDW: 14 % (ref 11.5–15.5)
WBC: 2.7 10*3/uL — AB (ref 4.0–10.5)

## 2016-10-22 LAB — BRAIN NATRIURETIC PEPTIDE: B NATRIURETIC PEPTIDE 5: 254.1 pg/mL — AB (ref 0.0–100.0)

## 2016-10-22 NOTE — ED Provider Notes (Signed)
[facility] DEPT Provider Note   CSN: [id number] Arrival date & time: 10/22/16  9242   By signing my name below, I, Eunice Blase, attest that this documentation has been prepared under the direction and in the presence of Jola Schmidt, MD. Electronically signed, Eunice Blase, ED Scribe. 10/22/16. 4:10 AM.   History   Chief Complaint Chief Complaint  Patient presents with  . Shortness of Breath   The history is provided by the patient and medical records. No language interpreter was used.    Jane [name] is a 83 y.o. female with h/o CHF, chronic SOB, stroke, HLD, anemia and COPD, transported via EMS to the Emergency Department with concern for acute on chronic SOB on exertion shortly PTA. She states she was walking to the restroom at the onset of her symptoms and she lives alone in a small apartment. Associated moderate to severe mid chest pain that has improved without intervention and burning epigastric pain noted. Pt adds use of an inhaler without relief. No other modifying factors noted. No chest tightness, leg swelling, N/V, dizziness or any other complaints noted at this time.   Past Medical History:  Diagnosis Date  . Anemia   . Aortic insufficiency    mild  . Arthritis    "arms" (08/09/2015)  . CAD (coronary artery disease)    cath 08/09/2014 95% stenosis in prox to mid RCA s/p DES, 80-90% prox OM2, 50% distal LAD  . CHF (congestive heart failure) ([place])   . CKD (chronic kidney disease) stage 3, GFR 30-59 ml/min   . Colon polyp    2009 colonoscopy, not retrieved for pathology  . Ectropion of left lower eyelid   . GERD (gastroesophageal reflux disease) 2009   EGD with benign gastric polyp too  . Hyperlipidemia   . Hypertension   . Stroke ([place]) 1975  . Vitamin D deficiency     Patient Active Problem List   Diagnosis Date Noted  . Acute on chronic congestive heart failure ([place])   . Muscle weakness (generalized)   . Unsteady gait 08/11/2016  . Low back pain   .  Abdominal aortic aneurysm ([place]) 05/21/2016  . Trochanteric bursitis of left hip   . Other pancytopenia ([place]) 12/27/2015  . CKD (chronic kidney disease) stage 3, GFR 30-59 ml/min 03/31/2015  . Prediabetes 10/16/2014  . Status post insertion of drug-eluting stent into right coronary artery for coronary artery disease 08/07/2014  . Seborrheic keratoses 08/05/2014  . Dyspnea 05/31/2014  . Physical deconditioning 05/31/2014  . Dementia 04/28/2014  . Heart failure with preserved ejection fraction ([place]) 12/27/2012  . Ectropion of left lower eyelid 12/17/2012  . Onychomycosis of toenail 09/18/2012  . PAD (peripheral artery disease) ([place]) 09/18/2012  . Preventative health care 09/18/2012  . Vitamin D deficiency 04/25/2008  . C V A / STROKE 10/29/2007  . Hyperlipidemia 09/24/2006  . Normocytic anemia 09/24/2006  . Essential hypertension 09/24/2006  . OSTEOPENIA 09/24/2006  . Vitamin B12 deficiency 08/29/2006    Past Surgical History:  Procedure Laterality Date  . ABDOMINAL HYSTERECTOMY    . APPENDECTOMY    . ARTERY BIOPSY Left 12/16/2012   Procedure: BIOPSY TEMPORAL ARTERY;  Surgeon: Mal Misty, MD;  Location: [facility];  Service: Vascular;  Laterality: Left;  . CARDIAC CATHETERIZATION    . LEFT HEART CATHETERIZATION WITH CORONARY ANGIOGRAM N/A 08/09/2014   Procedure: LEFT HEART CATHETERIZATION WITH CORONARY ANGIOGRAM;  Surgeon: Peter M Martinique, MD;  Location: [facility] CATH LAB;  Service: Cardiovascular;  Laterality:  N/A;  . PERCUTANEOUS CORONARY STENT INTERVENTION (PCI-S)  08/09/2014   Procedure: PERCUTANEOUS CORONARY STENT INTERVENTION (PCI-S);  Surgeon: Peter M Martinique, MD;  Location: [facility] C[facility] LAB;  Service: Cardiovascular;;  . TONSILLECTOMY      OB History    No data available       Home Medications    Prior to Admission medications   Medication Sig Start Date End Date Taking? Authorizing Provider  acetaminophen (TYLENOL) 325 MG tablet Take 650 mg by mouth every 6 (six) hours as needed  for mild pain, moderate pain or headache.     Historical Provider, MD  albuterol (PROAIR HFA) 108 (90 Base) MCG/ACT inhaler Inhale 1-2 puffs into the lungs every 4 (four) hours as needed for wheezing or shortness of breath. 03/12/16   Milagros Loll, MD  aspirin EC 81 MG tablet Take 81 mg by mouth daily.    Historical Provider, MD  atorvastatin (LIPITOR) 40 MG tablet Take 1 tablet (40 mg total) by mouth daily at 6 PM. 08/15/16   Milagros Loll, MD  bisoprolol (ZEBETA) 5 MG tablet Take 0.5 tablets (2.5 mg total) by mouth daily. 08/15/16   Milagros Loll, MD  furosemide (LASIX) 40 MG tablet Take 1 tablet (40 mg total) by mouth daily. 08/15/16   Milagros Loll, MD  isosorbide mononitrate (IMDUR) 30 MG 24 hr tablet Take 1 tablet (30 mg total) by mouth daily. 08/15/16   Milagros Loll, MD  losartan (COZAAR) 50 MG tablet take 1 tablet by mouth once daily 06/07/16   Milagros Loll, MD  meloxicam (MOBIC) 7.5 MG tablet Take 1 tablet (7.5 mg total) by mouth daily. 08/15/16   Milagros Loll, MD  Propylene Glycol-Glycerin (ARTIFICIAL TEARS) 1-0.3 % SOLN Apply 1-2 drops to eye daily as needed (dry eyes). 05/21/16   Asencion Partridge, MD  vitamin B-12 (CYANOCOBALAMIN) 1000 MCG tablet Take 1 tablet (1,000 mcg total) by mouth daily. 08/16/16   Milagros Loll, MD    Family History Family History  Problem Relation Age of Onset  . Stroke Mother   . Hypertension Mother   . Stroke Father   . Hypertension Father   . Diabetes Sister     Social History Social History  Substance Use Topics  . Smoking status: Former Smoker    Types: Cigarettes    Quit date: 07/02/1979  . Smokeless tobacco: Former Systems developer     Comment: Started in teenage years - [place]  . Alcohol use No     Comment: Quit alcohol 1975-76     Allergies   Patient has no known allergies.   Review of Systems Review of Systems  Respiratory: Positive for shortness of breath. Negative for chest tightness.   Cardiovascular: Positive for chest  pain. Negative for leg swelling.  All other systems reviewed and are negative.    Physical Exam Updated Vital Signs BP 122/68   Pulse 82   Temp 98.3 F (36.8 C) (Oral)   Resp 17   SpO2 98%   Physical Exam  Constitutional: She is oriented to person, place, and time. She appears well-developed and well-nourished. No distress.  HENT:  Head: Normocephalic and atraumatic.  Eyes: EOM are normal.  Neck: Normal range of motion.  Cardiovascular: Normal rate, regular rhythm and normal heart sounds.   Pulmonary/Chest: Effort normal and breath sounds normal.  Abdominal: Soft. She exhibits no distension. There is no tenderness.  Musculoskeletal: Normal range of motion.  Neurological: She is alert and  oriented to person, place, and time.  Skin: Skin is warm and dry.  Psychiatric: She has a normal mood and affect. Judgment normal.  Nursing note and vitals reviewed.    ED Treatments / Results  DIAGNOSTIC STUDIES: Oxygen Saturation is 98% on RA, NL by my interpretation.    COORDINATION OF CARE: 3:48 AM-Discussed next steps with pt. Pt verbalized understanding and is agreeable with the plan. Will order blood work and imaging.   Labs (all labs ordered are listed, but only abnormal results are displayed) Labs Reviewed  CBC - Abnormal; Notable for the following:       Result Value   WBC 2.7 (*)    RBC 2.95 (*)    Hemoglobin 9.5 (*)    HCT 28.1 (*)    Platelets 99 (*)    All other components within normal limits  BASIC METABOLIC PANEL - Abnormal; Notable for the following:    Chloride 112 (*)    BUN 25 (*)    Creatinine, Ser 1.09 (*)    Calcium 8.8 (*)    GFR calc non Af Amer 45 (*)    GFR calc Af Amer 52 (*)    All other components within normal limits  BRAIN NATRIURETIC PEPTIDE - Abnormal; Notable for the following:    B Natriuretic Peptide 254.1 (*)    All other components within normal limits  TROPONIN I   Hemoglobin  Date Value Ref Range Status  10/22/2016 9.5 (L) 12.0  - 15.0 g/dL Final  09/17/2016 9.5 (L) 12.0 - 15.0 g/dL Final  09/13/2016 9.6 (L) 12.0 - 15.0 g/dL Final  08/11/2016 10.1 (L) 12.0 - 15.0 g/dL Final     EKG  EKG Interpretation  Date/Time:  Tuesday October 22 2016 03:45:43 EDT Ventricular Rate:  77 PR Interval:    QRS Duration: 88 QT Interval:  415 QTC Calculation: 470 R Axis:   3 Text Interpretation:  Sinus rhythm Probable left atrial enlargement Low voltage, precordial leads Abnormal R-wave progression, early transition No significant change was found Confirmed by Roshaunda Starkey  MD, Lennette Bihari ([id number]) on 10/22/2016 7:54:20 AM       Radiology Dg Chest 2 View  Result Date: 10/22/2016 CLINICAL DATA:  83 year old female with shortness of breath. History of CHF and COPD. EXAM: CHEST  2 VIEW COMPARISON:  Chest CT dated 09/17/2016 FINDINGS: The lungs are clear. There is no pleural effusion or pneumothorax. There is mild cardiomegaly. There is osteopenia with mild degenerative changes of the shoulders. No acute fracture. IMPRESSION: 1. No acute cardiopulmonary process. 2. Mild cardiomegaly. Electronically Signed   By: Anner Crete M.D.   On: 10/22/2016 04:55    Procedures Procedures (including critical care time)  Medications Ordered in ED Medications - No data to display   Initial Impression / Assessment and Plan / ED Course  I have reviewed the triage vital signs and the nursing notes.  Pertinent labs & imaging results that were available during my care of the patient were reviewed by me and considered in my medical decision making (see chart for details).     Patient feels much better this time.  She is asymptomatic on arrival to emergency department.  No clear etiology for her symptoms.  Doubt CHF.  Doubt COPD.  Asymptomatic at this time..Presentation of ACS.  Primary care follow-up.  No indication for additional workup.  Ambulatory in the ER  Final Clinical Impressions(s) / ED Diagnoses   Final diagnoses:  SOB (shortness of breath)  New Prescriptions New Prescriptions   No medications on file  I personally performed the services described in this documentation, which was scribed in my presence. The recorded information has been reviewed and is accurate.        Jola Schmidt, MD 10/22/16 [phone]

## 2016-10-22 NOTE — ED Triage Notes (Signed)
Pt BIB EMS from home c/o SOB. Hx CHF, SOB, COPD. Use of inhaler w/out relief. Reports SOB, burning to epigastric area. Denies CP, N/V, dizziness. Per EMS, clear lung sound bilat with regular, unlabored breathing.

## 2016-10-22 NOTE — ED Notes (Signed)
ED Provider at bedside. 

## 2016-10-23 ENCOUNTER — Other Ambulatory Visit: Payer: Self-pay

## 2016-10-23 NOTE — Patient Outreach (Signed)
[facility]) Care Management  10/23/2016  Jane [name] Feb 26, 1929 [id number]   Telephone call to patient for screening from ED Utilization report.  No answer.  HIPAA compliant voice message left.  Plan: RN Health Coach will attempt patient again in the next ten business days.  [name], RN, MSN [name] [phone]

## 2016-10-24 ENCOUNTER — Ambulatory Visit (INDEPENDENT_AMBULATORY_CARE_PROVIDER_SITE_OTHER): Payer: Medicare Other | Admitting: Podiatry

## 2016-10-24 DIAGNOSIS — M79604 Pain in right leg: Secondary | ICD-10-CM

## 2016-10-24 DIAGNOSIS — M79605 Pain in left leg: Secondary | ICD-10-CM

## 2016-10-24 DIAGNOSIS — B351 Tinea unguium: Secondary | ICD-10-CM

## 2016-10-24 NOTE — Progress Notes (Signed)
   Subjective:    Patient ID: Jane [name], female    DOB: Nov 12, 1928, 83 y.o.   MRN: [id number]  HPI  I have toe nails that need to be cut they hurt and haven't been cut in years I just can't do it.     Review of Systems  HENT: Positive for hearing loss.   Eyes: Positive for redness and itching.  Respiratory: Positive for shortness of breath.   Cardiovascular: Positive for chest pain.  Gastrointestinal: Positive for blood in stool.  Endocrine: Positive for polyuria.  Musculoskeletal: Positive for back pain.  Neurological: Positive for numbness.  Hematological: Bruises/bleeds easily.  Psychiatric/Behavioral: Positive for confusion.  All other systems reviewed and are negative.      Objective:   Physical Exam        Assessment & Plan:

## 2016-10-25 ENCOUNTER — Other Ambulatory Visit: Payer: Self-pay | Admitting: Pulmonary Disease

## 2016-10-26 NOTE — Progress Notes (Signed)
Subjective:    Patient ID: Jane [name], female   DOB: 83 y.o.   MRN: [id number]   HPI patient presents with extreme thickness of his nailbeds 1-5 both feet stating they are also and she can not cut them and they are extremely painful in shoes    Review of Systems  All other systems reviewed and are negative.       Objective:  Physical Exam  Constitutional: She is oriented to person, place, and time.  Musculoskeletal: Normal range of motion.  Neurological: She is alert and oriented to person, place, and time.  Skin: Skin is warm and dry.  Nursing note and vitals reviewed.  neurovascular status was diminished with diminished DP PT pulses and diminished sharp dull and vibratory. Patient is also noted to have diminished range of motion and had extreme thickness of the nailbeds 1-5 both feet with curling irritation and skin secondary to length and pain when palpated     Assessment:    Patient has poor hygiene with severe nail disease 1-5 both feet with multiple risk factors     Plan:    H&P and condition reviewed at great length. Today I went ahead and debrided all nails 1-5 both feet taking out all damaged tissue and thick nails and was tolerated well with no iatrogenic bleeding noted. Patient be seen back as needed

## 2016-10-28 ENCOUNTER — Other Ambulatory Visit: Payer: Self-pay | Admitting: Pulmonary Disease

## 2016-10-28 ENCOUNTER — Other Ambulatory Visit: Payer: Self-pay

## 2016-10-28 NOTE — Patient Outreach (Signed)
[facility]) Care Management  10/28/2016  THEADORA NOYES 14-Sep-1928 [id number]  2nd telephone call to patient for ED utilization referral.  No answer,  HIPAA compliant voice message left.    Plan: RN Health Coach will attempt patient again within 10 business days.    [name], RN, MSN [place] [phone]

## 2016-10-31 ENCOUNTER — Other Ambulatory Visit: Payer: Self-pay

## 2016-10-31 NOTE — Patient Outreach (Signed)
[facility]) Care Management  10/31/2016  Jane [name] 23-Apr-1929 [id number]   Telephone call to patient for ED utilization referral.  No answer.  HIPAA compliant voice message left.   Plan: RN Health Coach will send letter to attempt outreach. If no response in 10 business days will proceed with case closure.  [name], RN, MSN [place] [phone]

## 2016-11-14 ENCOUNTER — Other Ambulatory Visit: Payer: Self-pay

## 2016-11-14 NOTE — Patient Outreach (Signed)
[facility]) Care Management  11/14/2016  Jane [name] 06-May-1929 [id number]   Patient has not responded to calls or letter.  RN Health coach will close case.    Plan: RN Health Coach will notify care management assistant of case status.  [name], RN, MSN [place] [phone]

## 2016-12-12 ENCOUNTER — Encounter: Payer: Self-pay | Admitting: Pulmonary Disease

## 2016-12-12 ENCOUNTER — Ambulatory Visit (INDEPENDENT_AMBULATORY_CARE_PROVIDER_SITE_OTHER): Payer: Medicare Other | Admitting: Pulmonary Disease

## 2016-12-12 VITALS — BP 138/52 | HR 71 | Temp 98.0°F | Ht 64.5 in | Wt 163.5 lb

## 2016-12-12 DIAGNOSIS — I130 Hypertensive heart and chronic kidney disease with heart failure and stage 1 through stage 4 chronic kidney disease, or unspecified chronic kidney disease: Secondary | ICD-10-CM

## 2016-12-12 DIAGNOSIS — K219 Gastro-esophageal reflux disease without esophagitis: Secondary | ICD-10-CM

## 2016-12-12 DIAGNOSIS — I2510 Atherosclerotic heart disease of native coronary artery without angina pectoris: Secondary | ICD-10-CM

## 2016-12-12 DIAGNOSIS — W2209XA Striking against other stationary object, initial encounter: Secondary | ICD-10-CM | POA: Diagnosis not present

## 2016-12-12 DIAGNOSIS — N183 Chronic kidney disease, stage 3 (moderate): Secondary | ICD-10-CM | POA: Diagnosis not present

## 2016-12-12 DIAGNOSIS — E785 Hyperlipidemia, unspecified: Secondary | ICD-10-CM | POA: Diagnosis not present

## 2016-12-12 DIAGNOSIS — R0600 Dyspnea, unspecified: Secondary | ICD-10-CM

## 2016-12-12 DIAGNOSIS — Z8673 Personal history of transient ischemic attack (TIA), and cerebral infarction without residual deficits: Secondary | ICD-10-CM | POA: Diagnosis not present

## 2016-12-12 DIAGNOSIS — R0602 Shortness of breath: Secondary | ICD-10-CM

## 2016-12-12 DIAGNOSIS — Z5189 Encounter for other specified aftercare: Secondary | ICD-10-CM

## 2016-12-12 DIAGNOSIS — Z87891 Personal history of nicotine dependence: Secondary | ICD-10-CM

## 2016-12-12 DIAGNOSIS — D649 Anemia, unspecified: Secondary | ICD-10-CM | POA: Diagnosis not present

## 2016-12-12 DIAGNOSIS — T148XXA Other injury of unspecified body region, initial encounter: Secondary | ICD-10-CM | POA: Insufficient documentation

## 2016-12-12 DIAGNOSIS — I5032 Chronic diastolic (congestive) heart failure: Secondary | ICD-10-CM | POA: Diagnosis not present

## 2016-12-12 DIAGNOSIS — E538 Deficiency of other specified B group vitamins: Secondary | ICD-10-CM

## 2016-12-12 DIAGNOSIS — Z955 Presence of coronary angioplasty implant and graft: Secondary | ICD-10-CM | POA: Diagnosis not present

## 2016-12-12 DIAGNOSIS — Y92002 Bathroom of unspecified non-institutional (private) residence single-family (private) house as the place of occurrence of the external cause: Secondary | ICD-10-CM

## 2016-12-12 DIAGNOSIS — S40012A Contusion of left shoulder, initial encounter: Secondary | ICD-10-CM

## 2016-12-12 DIAGNOSIS — S20222A Contusion of left back wall of thorax, initial encounter: Secondary | ICD-10-CM

## 2016-12-12 NOTE — Assessment & Plan Note (Addendum)
Assessment: Dyspnea improved with less ED visits for dyspnea. Probably related to deconditioning. She is euvolemic on exam.   Plan: If her dypsnea were to worsen, would get an echo as was previously recommended Will try to arrange a follow up with her cardiologist since she is overdue for follow up with him

## 2016-12-12 NOTE — Assessment & Plan Note (Signed)
Discussed with her the reason she is on vitamin b12 supplements. Encouraged her to continue taking them regularly. Recheck level at next visit.

## 2016-12-12 NOTE — Patient Instructions (Signed)
Please follow up in 3 months Please take your medications as prescribed Please follow up with your heart doctor

## 2016-12-12 NOTE — Progress Notes (Signed)
   CC: dyspnea follow up  HPI:  [name] is a 83 year old woman with history of anemia, GERD, hyperlipidemia, stroke, CAD s/p DES in 9038, chronic diastolic CHF, CKD stage III, hypertension here for follow up of her dyspnea.   She was last seen in clinic on 10/02/2016. She was only seen in the ED once for dyspnea since that time. Improvement from being seen twice in March. PFTs from March 2017 with isolated uncorrected diffusion defect. She has anemia. CTA chest 3/20 with clear lungs. Mildly increased systolic pressure in pulmonary arteries on echo 05/2016. She reports she is taking her time more and has been dyspnea less frequently. She also has been more compliant of her medications.   Vitamin B12 deficiency: Improved from <150 to 188. She reports she is not taking it regularly.   Past Medical History:  Diagnosis Date  . Anemia   . Aortic insufficiency    mild  . Arthritis    "arms" (08/09/2015)  . CAD (coronary artery disease)    cath 08/09/2014 95% stenosis in prox to mid RCA s/p DES, 80-90% prox OM2, 50% distal LAD  . CHF (congestive heart failure) ([place])   . CKD (chronic kidney disease) stage 3, GFR 30-59 ml/min   . Colon polyp    2009 colonoscopy, not retrieved for pathology  . Ectropion of left lower eyelid   . GERD (gastroesophageal reflux disease) 2009   EGD with benign gastric polyp too  . Hyperlipidemia   . Hypertension   . Stroke ([place]) 1975  . Vitamin D deficiency     Review of Systems:   No fever No chest pain  Physical Exam:  Vitals:   12/12/16 1330  BP: (!) 138/52  Pulse: 71  Temp: 98 F (36.7 C)  TempSrc: Oral  SpO2: 98%  Weight: 163 lb 8 oz (74.2 kg)  Height: 5' 4.5" (1.638 m)   General Apperance: NAD HEENT: Normocephalic, atraumatic, anicteric sclera Neck: Supple, trachea midline Lungs: Clear to auscultation bilaterally. No wheezes, rhonchi or rales. Breathing comfortably Heart: Regular rate and rhythm, no murmur/rub/gallop Abdomen: Soft,  nontender, nondistended, no rebound/guarding Extremities: Warm and well perfused, no edema Skin: Ecchymosis posterior left shoulder Neurologic: Alert and interactive. No gross deficits.  Assessment & Plan:   See Encounters Tab for problem based charting.  Patient discussed with Dr. Beryle Beams

## 2016-12-12 NOTE — Assessment & Plan Note (Signed)
Contusion with ecchymosis of posterior left shoulder. She was rushing to sit down on the toilet and hit her shoulder on the bar that helps her get up from the toilet. She notes that the ecchymosis has been improving. She has full active range of motion. No falls or LOC. No head trauma.  Continue to monitor for resolution.

## 2016-12-13 ENCOUNTER — Encounter: Payer: Self-pay | Admitting: *Deleted

## 2016-12-13 NOTE — Progress Notes (Signed)
Medicine attending: Medical history, presenting problems, physical findings, and medications, reviewed with resident physician Dr Jennifer Krall on the day of the patient visit and I concur with her evaluation and management plan. 

## 2016-12-13 NOTE — Progress Notes (Signed)
Appt scheduled with Dr Martinique on July 3; appt card given to pt.

## 2016-12-16 ENCOUNTER — Telehealth: Payer: Self-pay | Admitting: Cardiology

## 2016-12-16 NOTE — Telephone Encounter (Signed)
12/16/16 - Received records from [name] for upcoming appointment with Dr. Martinique on 12/31/16 @ 1:40pm. Records given to [facility]. aib
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
## 2016-12-31 ENCOUNTER — Encounter: Payer: Self-pay | Admitting: Cardiology

## 2016-12-31 ENCOUNTER — Ambulatory Visit (INDEPENDENT_AMBULATORY_CARE_PROVIDER_SITE_OTHER): Payer: Medicare Other | Admitting: Cardiology

## 2016-12-31 VITALS — BP 144/68 | HR 80 | Ht 64.5 in | Wt 161.0 lb

## 2016-12-31 DIAGNOSIS — I25119 Atherosclerotic heart disease of native coronary artery with unspecified angina pectoris: Secondary | ICD-10-CM

## 2016-12-31 DIAGNOSIS — E785 Hyperlipidemia, unspecified: Secondary | ICD-10-CM

## 2016-12-31 DIAGNOSIS — I209 Angina pectoris, unspecified: Secondary | ICD-10-CM | POA: Diagnosis not present

## 2016-12-31 DIAGNOSIS — I5032 Chronic diastolic (congestive) heart failure: Secondary | ICD-10-CM | POA: Diagnosis not present

## 2016-12-31 DIAGNOSIS — I10 Essential (primary) hypertension: Secondary | ICD-10-CM | POA: Diagnosis not present

## 2016-12-31 NOTE — Patient Instructions (Addendum)
We will check your blood work today  Continue your current therapy  Try and limit your salt intake.  I will see you in 6 months

## 2017-01-01 LAB — BASIC METABOLIC PANEL
BUN/Creatinine Ratio: 22 (ref 12–28)
BUN: 27 mg/dL (ref 8–27)
CALCIUM: 9.2 mg/dL (ref 8.7–10.3)
CO2: 19 mmol/L — AB (ref 20–29)
Chloride: 110 mmol/L — ABNORMAL HIGH (ref 96–106)
Creatinine, Ser: 1.22 mg/dL — ABNORMAL HIGH (ref 0.57–1.00)
GFR, EST AFRICAN AMERICAN: 46 mL/min/{1.73_m2} — AB (ref >59)
GFR, EST NON AFRICAN AMERICAN: 40 mL/min/{1.73_m2} — AB (ref >59)
Glucose: 86 mg/dL (ref 65–99)
Potassium: 4.4 mmol/L (ref 3.5–5.2)
Sodium: 144 mmol/L (ref 134–144)

## 2017-01-01 LAB — LIPID PANEL
CHOL/HDL RATIO: 2.3 ratio (ref 0.0–4.4)
Cholesterol, Total: 140 mg/dL (ref 100–199)
HDL: 61 mg/dL (ref >39)
LDL Calculated: 68 mg/dL (ref 0–99)
Triglycerides: 57 mg/dL (ref 0–149)
VLDL CHOLESTEROL CAL: 11 mg/dL (ref 5–40)

## 2017-01-01 LAB — HEPATIC FUNCTION PANEL
ALBUMIN: 4.4 g/dL (ref 3.5–4.7)
ALK PHOS: 72 IU/L (ref 39–117)
ALT: 12 IU/L (ref 0–32)
AST: 18 IU/L (ref 0–40)
Bilirubin Total: 1.2 mg/dL (ref 0.0–1.2)
Bilirubin, Direct: 0.26 mg/dL (ref 0.00–0.40)
Total Protein: 6.8 g/dL (ref 6.0–8.5)

## 2017-01-15 ENCOUNTER — Other Ambulatory Visit: Payer: Self-pay

## 2017-01-15 MED ORDER — MELOXICAM 7.5 MG PO TABS: 7.5000 mg | ORAL_TABLET | Freq: Every day | ORAL | 0 refills | Status: DC | PRN

## 2017-01-15 NOTE — Telephone Encounter (Signed)
meloxicam (MOBIC) 7.5 MG tablet, refill request @ [facility] on [address].

## 2017-01-15 NOTE — Telephone Encounter (Signed)
Addressed Dec 2017 and to take QD. GFR's in 40's. Will refill for 30 pills but she needs to take it sparingly due to CKD. To discuss next PCP appt

## 2017-02-18 ENCOUNTER — Other Ambulatory Visit: Payer: Self-pay | Admitting: Internal Medicine

## 2017-03-13 ENCOUNTER — Encounter: Payer: Self-pay | Admitting: Internal Medicine

## 2017-03-18 ENCOUNTER — Ambulatory Visit: Payer: Self-pay

## 2017-03-20 ENCOUNTER — Ambulatory Visit: Payer: Self-pay

## 2017-03-24 ENCOUNTER — Ambulatory Visit (INDEPENDENT_AMBULATORY_CARE_PROVIDER_SITE_OTHER): Payer: Medicare Other | Admitting: Internal Medicine

## 2017-03-24 ENCOUNTER — Encounter: Payer: Self-pay | Admitting: Internal Medicine

## 2017-03-24 ENCOUNTER — Encounter (INDEPENDENT_AMBULATORY_CARE_PROVIDER_SITE_OTHER): Payer: Self-pay

## 2017-03-24 VITALS — BP 152/70 | HR 71 | Temp 98.2°F | Wt 161.3 lb

## 2017-03-24 DIAGNOSIS — Z23 Encounter for immunization: Secondary | ICD-10-CM | POA: Diagnosis not present

## 2017-03-24 DIAGNOSIS — H02409 Unspecified ptosis of unspecified eyelid: Secondary | ICD-10-CM | POA: Diagnosis not present

## 2017-03-24 DIAGNOSIS — E538 Deficiency of other specified B group vitamins: Secondary | ICD-10-CM | POA: Diagnosis not present

## 2017-03-24 DIAGNOSIS — Z7982 Long term (current) use of aspirin: Secondary | ICD-10-CM

## 2017-03-24 DIAGNOSIS — I10 Essential (primary) hypertension: Secondary | ICD-10-CM

## 2017-03-24 DIAGNOSIS — E785 Hyperlipidemia, unspecified: Secondary | ICD-10-CM | POA: Diagnosis not present

## 2017-03-24 DIAGNOSIS — Z79899 Other long term (current) drug therapy: Secondary | ICD-10-CM

## 2017-03-24 DIAGNOSIS — M2550 Pain in unspecified joint: Secondary | ICD-10-CM

## 2017-03-24 DIAGNOSIS — Z87891 Personal history of nicotine dependence: Secondary | ICD-10-CM

## 2017-03-24 MED ORDER — LOSARTAN POTASSIUM 100 MG PO TABS: 100.0000 mg | ORAL_TABLET | Freq: Every day | ORAL | 3 refills | Status: DC

## 2017-03-24 NOTE — Progress Notes (Signed)
   CC: hypertension  HPI:  [name] is a 83 y.o. female with past medical history documented as below presenting for follow up for hypertension, hyperlipidemia, and vitamin B12 deficiency. She states she takes all of her medicines as prescribed except for the vitamin B12, which does not take consistently. She has no acute complaints at this time.   Please see encounter based charting for management of chronic problems.   Past Medical History:  Diagnosis Date  . Anemia   . Aortic insufficiency    mild  . Arthritis    "arms" (08/09/2015)  . CAD (coronary artery disease)    cath 08/09/2014 95% stenosis in prox to mid RCA s/p DES, 80-90% prox OM2, 50% distal LAD  . CHF (congestive heart failure) ([place])   . CKD (chronic kidney disease) stage 3, GFR 30-59 ml/min   . Colon polyp    2009 colonoscopy, not retrieved for pathology  . Ectropion of left lower eyelid   . GERD (gastroesophageal reflux disease) 2009   EGD with benign gastric polyp too  . Hyperlipidemia   . Hypertension   . Stroke ([place]) 1975  . Vitamin D deficiency    Review of Systems:   Review of Systems  Constitutional: Negative for chills, fever and weight loss.  Respiratory: Negative for shortness of breath.   Cardiovascular: Negative for chest pain and leg swelling.  Gastrointestinal: Negative for nausea and vomiting.  Musculoskeletal: Positive for joint pain. Negative for falls.  Neurological: Negative for dizziness, tingling, sensory change and focal weakness.    Physical Exam:  Vitals:   03/24/17 1433  BP: (!) 152/70  Pulse: 71  Temp: 98.2 F (36.8 C)  TempSrc: Oral  SpO2: 100%  Weight: 161 lb 4.8 oz (73.2 kg)   General: Sitting comfortably, NAD HEENT: /AT, +ptsosis R>L, +R ectropion, EOMI, no scleral icterus Cardiac: RRR, No R/M/G appreciated Pulm: normal effort, CTAB Abd: soft, non tender, non distended, BS normal Ext: extremities well perfused, no peripheral edema Neuro: alert and oriented  X3, cranial nerves II-XII grossly intact   Assessment & Plan:   See Encounters Tab for problem based charting.  Patient seen with Dr. Dareen Piano

## 2017-03-24 NOTE — Assessment & Plan Note (Addendum)
Lipid Panel     Component Value Date/Time   CHOL 140 12/31/2016 1409   TRIG 57 12/31/2016 1409   HDL 61 12/31/2016 1409   CHOLHDL 2.3 12/31/2016 1409   CHOLHDL 4.3 06/01/2014 0633   VLDL 17 06/01/2014 0633   LDLCALC 68 12/31/2016 1409  Well controlled on Lipitor 40 mg.   Plan: Refilled Lipitor 40 mg.

## 2017-03-24 NOTE — Assessment & Plan Note (Addendum)
Patient currently not taking Vitamin B12 daily. She denies paresthesias, numbness, or unsteady gait, but does state she is forgetful and has difficulty with memory. Most recent B12 level 188 (09/2016). She had a positive and elevated intrinsic factor antibody in 2016 that was 196. She received B12  injections at that time. She was also had a normocytic anemia at that time with a Hbg 9.5.   Plan:  -Continue Vitamin B12 supplementation -Checking B12 level today, consider injections if below level of detection  -CBC next visit   Addendum 03/26/2017: -Vitamin B12 level low at 151 -Has a follow up appointment scheduled in 3 weeks for blood pressure check, at this time I would suggest a vitamin b12 injection due to non-adherence with oral supplementation and history of elevated intrinsic factor antibody in the past

## 2017-03-24 NOTE — Patient Instructions (Signed)
Jane [name],   It was nice meeting you today.   I have changed the dose of one of your blood pressure medications called Losartan.   Take 2 pills of Losartan 50 mg for a total of 100 mg daily.  I have sent in a prescription for Losartan 100 mg. When you receive this prescription take only 1 tablet daily for a total of 100 mg daily.   Start taking Vitamin B12 1000 mcg daily. Please pick up a refill of this prescription.   Return to clinic in one month to recheck your blood pressure and get some blood work.

## 2017-03-24 NOTE — Assessment & Plan Note (Signed)
BP Readings from Last 3 Encounters:  03/24/17 (!) 152/70  12/31/16 (!) 144/68  12/12/16 (!) 138/52   Mildly elevated on current regimen with room for improvement. Current regimen includes: Bisoprolol 2.5 mg, Losartan 50 mg, Furosemide 40 mg daily.  Previous BMET within normal limits: Creatinine 1.22, Potassium 4.4.   Plan: -Increased Losartan to 100 mg daily -Return to clinic in 1 month for BP check and BMET

## 2017-03-24 NOTE — Assessment & Plan Note (Signed)
Flu vaccine administered on 03/24/2017.

## 2017-03-25 LAB — VITAMIN B12: Vitamin B-12: 151 pg/mL — ABNORMAL LOW (ref 232–1245)

## 2017-03-31 NOTE — Progress Notes (Signed)
Internal Medicine Clinic Attending  I saw and evaluated the patient.  I personally confirmed the key portions of the history and exam documented by Dr. LaCroce and I reviewed pertinent patient test results.  The assessment, diagnosis, and plan were formulated together and I agree with the documentation in the resident's note.  

## 2017-04-02 ENCOUNTER — Other Ambulatory Visit: Payer: Self-pay | Admitting: *Deleted

## 2017-04-02 MED ORDER — MELOXICAM 7.5 MG PO TABS: ORAL_TABLET | ORAL | 0 refills | Status: DC

## 2017-04-21 ENCOUNTER — Encounter: Payer: Self-pay | Admitting: Internal Medicine

## 2017-04-21 ENCOUNTER — Ambulatory Visit (INDEPENDENT_AMBULATORY_CARE_PROVIDER_SITE_OTHER): Payer: Medicare Other | Admitting: Internal Medicine

## 2017-04-21 VITALS — BP 143/55 | HR 66 | Temp 98.2°F | Ht 64.5 in | Wt 164.4 lb

## 2017-04-21 DIAGNOSIS — I5020 Unspecified systolic (congestive) heart failure: Secondary | ICD-10-CM

## 2017-04-21 DIAGNOSIS — I10 Essential (primary) hypertension: Secondary | ICD-10-CM | POA: Diagnosis not present

## 2017-04-21 DIAGNOSIS — Z7982 Long term (current) use of aspirin: Secondary | ICD-10-CM

## 2017-04-21 DIAGNOSIS — N183 Chronic kidney disease, stage 3 (moderate): Secondary | ICD-10-CM

## 2017-04-21 DIAGNOSIS — H02105 Unspecified ectropion of left lower eyelid: Secondary | ICD-10-CM

## 2017-04-21 DIAGNOSIS — Z79899 Other long term (current) drug therapy: Secondary | ICD-10-CM | POA: Diagnosis not present

## 2017-04-21 DIAGNOSIS — Z87891 Personal history of nicotine dependence: Secondary | ICD-10-CM | POA: Diagnosis not present

## 2017-04-21 DIAGNOSIS — I739 Peripheral vascular disease, unspecified: Secondary | ICD-10-CM

## 2017-04-21 DIAGNOSIS — I130 Hypertensive heart and chronic kidney disease with heart failure and stage 1 through stage 4 chronic kidney disease, or unspecified chronic kidney disease: Secondary | ICD-10-CM

## 2017-04-21 NOTE — Patient Instructions (Addendum)
It was a pleasure to see you Jane [name].  Your blood pressure looks okay today, only a little higher than usual.  We will continue your current medications as prescribed.  We are checking blood work today to assess your kidney and potassium levels.  Please follow up with your heart doctor as advised.  Please follow up with Dr. Ronalee Red in about 3 months or see [place] sooner if needed.

## 2017-04-21 NOTE — Progress Notes (Signed)
   CC: HTN  HPI:  [name] is a 83 y.o. female with PMH as listed below including HFpEF, HTN, PAD, and CKD Stage 3 who presents for follow up management of her HTN.  HTN: Patient brought her medications today. She has been taking Losartan 100 mg daily (increased from 50 mg daily last visit), Bisoprolol 2.5 mg daily, Lasix 40 mg daily, and Imdur 30 mg daily. BP this visit is 143/55.  Past Medical History:  Diagnosis Date  . Anemia   . Aortic insufficiency    mild  . Arthritis    "arms" (08/09/2015)  . CAD (coronary artery disease)    cath 08/09/2014 95% stenosis in prox to mid RCA s/p DES, 80-90% prox OM2, 50% distal LAD  . CHF (congestive heart failure) ([place])   . CKD (chronic kidney disease) stage 3, GFR 30-59 ml/min ([facility])   . Colon polyp    2009 colonoscopy, not retrieved for pathology  . Ectropion of left lower eyelid   . GERD (gastroesophageal reflux disease) 2009   EGD with benign gastric polyp too  . Hyperlipidemia   . Hypertension   . Stroke ([place]) 1975  . Vitamin D deficiency    Review of Systems:   Review of Systems  HENT:       Left eyelid issue  Respiratory: Negative for shortness of breath.   Cardiovascular: Negative for chest pain and leg swelling.  Musculoskeletal: Negative for falls.  Neurological: Negative for dizziness, loss of consciousness and headaches.     Physical Exam:  Vitals:   04/21/17 1401  BP: (!) 143/55  Pulse: 66  Temp: 98.2 F (36.8 C)  TempSrc: Oral  SpO2: 98%  Weight: 164 lb 6.4 oz (74.6 kg)  Height: 5' 4.5" (1.638 m)   Physical Exam  Constitutional: She is oriented to person, place, and time. She appears well-developed. No distress.  Eyes: Right eye exhibits no discharge.  Left lower eyelid ectropion  Cardiovascular: Normal rate and regular rhythm.   No murmur heard. Pulmonary/Chest: Effort normal. No respiratory distress. She has no wheezes. She has no rales.  Musculoskeletal: She exhibits no edema.  Neurological:  She is alert and oriented to person, place, and time.  Skin: Skin is warm. She is not diaphoretic.    Assessment & Plan:   See Encounters Tab for problem based charting.  Patient discussed with Dr. Daryll Drown  Essential hypertension BP Readings from Last 3 Encounters:  04/21/17 (!) 143/55  03/24/17 (!) 152/70  12/31/16 (!) 144/68   BP is only mildly elevated this visit. Repeat BMET shows stable renal function with creatinine 1.05 and potassium 3.5. Will continue current management. - Continue Losartan 100 mg daily, Bisoprolol 2.5 mg daily, Lasix 40 mg daily, and Imdur 30 mg daily

## 2017-04-22 LAB — BMP8+ANION GAP
Anion Gap: 13 mmol/L (ref 10.0–18.0)
BUN / CREAT RATIO: 17 (ref 12–28)
BUN: 18 mg/dL (ref 8–27)
CALCIUM: 8.7 mg/dL (ref 8.7–10.3)
CHLORIDE: 107 mmol/L — AB (ref 96–106)
CO2: 24 mmol/L (ref 20–29)
Creatinine, Ser: 1.05 mg/dL — ABNORMAL HIGH (ref 0.57–1.00)
GFR calc non Af Amer: 48 mL/min/{1.73_m2} — ABNORMAL LOW (ref >59)
GFR, EST AFRICAN AMERICAN: 56 mL/min/{1.73_m2} — AB (ref >59)
GLUCOSE: 74 mg/dL (ref 65–99)
POTASSIUM: 3.5 mmol/L (ref 3.5–5.2)
Sodium: 144 mmol/L (ref 134–144)

## 2017-04-22 NOTE — Progress Notes (Signed)
Internal Medicine Clinic Attending  Case discussed with Dr. Patel at the time of the visit.  We reviewed the resident's history and exam and pertinent patient test results.  I agree with the assessment, diagnosis, and plan of care documented in the resident's note.  

## 2017-04-22 NOTE — Assessment & Plan Note (Signed)
BP Readings from Last 3 Encounters:  04/21/17 (!) 143/55  03/24/17 (!) 152/70  12/31/16 (!) 144/68   BP is only mildly elevated this visit. Repeat BMET shows stable renal function with creatinine 1.05 and potassium 3.5. Will continue current management. - Continue Losartan 100 mg daily, Bisoprolol 2.5 mg daily, Lasix 40 mg daily, and Imdur 30 mg daily

## 2017-05-06 ENCOUNTER — Other Ambulatory Visit: Payer: Self-pay | Admitting: Internal Medicine

## 2017-05-06 NOTE — Telephone Encounter (Signed)
In review of her chart it appears Mobic was prescribed >1 year ago for hip pain, I dont see this has been addressed in the months since then.  However I believe her PCP is here today so I will defer this refill to the PCP.  I would encourage documentation of need for this medication especially given her age and history of HTN.

## 2017-05-07 NOTE — Telephone Encounter (Signed)
Discussed with Dr Ronalee Red, will refill 1 month supply

## 2017-06-12 ENCOUNTER — Other Ambulatory Visit: Payer: Self-pay | Admitting: Internal Medicine

## 2017-06-12 MED ORDER — MELOXICAM 7.5 MG PO TABS: 7.5000 mg | ORAL_TABLET | Freq: Every day | ORAL | 3 refills | Status: DC

## 2017-06-12 NOTE — Telephone Encounter (Signed)
Patient is requesting a refills on medicine meloxicam 7.5mg  tablet, patient only have 2 pills left. Pls call pharmacy/patient

## 2017-07-14 ENCOUNTER — Ambulatory Visit (INDEPENDENT_AMBULATORY_CARE_PROVIDER_SITE_OTHER): Payer: Medicare Other | Admitting: Internal Medicine

## 2017-07-14 ENCOUNTER — Encounter: Payer: Self-pay | Admitting: Internal Medicine

## 2017-07-14 ENCOUNTER — Other Ambulatory Visit: Payer: Self-pay

## 2017-07-14 VITALS — BP 142/82 | HR 65 | Temp 98.2°F | Ht 64.0 in | Wt 163.2 lb

## 2017-07-14 DIAGNOSIS — I739 Peripheral vascular disease, unspecified: Secondary | ICD-10-CM

## 2017-07-14 DIAGNOSIS — I110 Hypertensive heart disease with heart failure: Secondary | ICD-10-CM | POA: Diagnosis present

## 2017-07-14 DIAGNOSIS — Z955 Presence of coronary angioplasty implant and graft: Secondary | ICD-10-CM | POA: Diagnosis not present

## 2017-07-14 DIAGNOSIS — E538 Deficiency of other specified B group vitamins: Secondary | ICD-10-CM | POA: Diagnosis not present

## 2017-07-14 DIAGNOSIS — I2510 Atherosclerotic heart disease of native coronary artery without angina pectoris: Secondary | ICD-10-CM | POA: Diagnosis not present

## 2017-07-14 DIAGNOSIS — Z79899 Other long term (current) drug therapy: Secondary | ICD-10-CM | POA: Diagnosis not present

## 2017-07-14 DIAGNOSIS — I5030 Unspecified diastolic (congestive) heart failure: Secondary | ICD-10-CM

## 2017-07-14 DIAGNOSIS — H02105 Unspecified ectropion of left lower eyelid: Secondary | ICD-10-CM

## 2017-07-14 DIAGNOSIS — I10 Essential (primary) hypertension: Secondary | ICD-10-CM

## 2017-07-14 MED ORDER — PROPYLENE GLYCOL-GLYCERIN 1-0.3 % OP SOLN: 1.0000 [drp] | Freq: Every day | OPHTHALMIC | 1 refills | Status: DC | PRN

## 2017-07-14 NOTE — Patient Instructions (Addendum)
FOLLOW-UP INSTRUCTIONS When: 3 months For: BP and B12 deficiency management What to bring: medications   Ms. Bendall,  It was a pleasure to meet you today.  We are checking your Vitamin B12 level today. As we discussed during your visit, please think more about whether you would like to do B12 shots  instead of the pills. If you decide to go with the shots, they would be once a month and you would not need to take the pills every day. For now, please continue taking the Vitamin B12 pill once a day.  For your left eye, I am sending in a prescription for artificial tears. Please use 1-2 eye drops every day as needed for dry eyes or pain.  I will see you back in 3 months or sooner if needed.

## 2017-07-14 NOTE — Assessment & Plan Note (Addendum)
Assessment Most recent B12 level 151 (03/2017), on Vit B12 1045mcg daily. Positive and elevated IF antibody in 2016 (196). She previously has had some difficulty with compliance with this medication, thus injections were considered. She states that she is taking this medication more regularly. She denies numbness, tingling, or paresthesias to her lower extremities. She does have some memory problems, which are chronic.   We discussed injections as an alternative to the daily pills. She said she would think about it, particularly because she would need a ride to the clinic every month, which would be tough for her.  Plan - Continue vitamin B12 1000 mcg daily. - Check B12 today - Discuss B12 injections with patient at next visit

## 2017-07-14 NOTE — Progress Notes (Signed)
   CC: Management of hypertension  HPI:  [name] is a 84 y.o. female with PMH significant for HTN, HFpEF (echo in 2017 with EF 55-60%, grade 1 DD), PAD, and CAD s/p DES in 2016 who presents for management of hypertension. She did bring her medications with her.  Denies smoking or alcohol use. Lives in [place] by herself. She does have family in the area. Nephew provided the transportation for her to come to clinic today.  Please see the assessment and plan below for the status of the patient's chronic medical problems.  Past Medical History:  Diagnosis Date  . Anemia   . Aortic insufficiency    mild  . Arthritis    "arms" (08/09/2015)  . CAD (coronary artery disease)    cath 08/09/2014 95% stenosis in prox to mid RCA s/p DES, 80-90% prox OM2, 50% distal LAD  . CHF (congestive heart failure) ([place])   . CKD (chronic kidney disease) stage 3, GFR 30-59 ml/min ([facility])   . Colon polyp    2009 colonoscopy, not retrieved for pathology  . Ectropion of left lower eyelid   . GERD (gastroesophageal reflux disease) 2009   EGD with benign gastric polyp too  . Hyperlipidemia   . Hypertension   . Stroke ([place]) 1975  . Vitamin D deficiency    Review of Systems:   Constitutional: Negative for fever, malaise/fatigue.  HEENT: Negative for dry eyes or pain. Positive for ectropion of left lower eyelid. Negative for blurry vision. Respiratory: Negative for shortness of breath and wheezing. Cardiovascular: Negative for chest pain and leg swelling. Gastrointestinal: Negative for abdominal pain, nausea and vomiting. Neurological: Negative for dizziness and headaches.  Physical Exam:  Vitals:   07/14/17 1349  BP: (!) 161/55  Pulse: 65  Temp: 98.2 F (36.8 C)  TempSrc: Oral  SpO2: 99%  Weight: 163 lb 3.2 oz (74 kg)  Height: 5\' 4"  (1.626 m)   GEN: Sitting in wheelchair in NAD. Some memory problems, often repeats self HEENT: Ectropion of left lower eyelid. Conjunctivae clear and not  erythematous. PERRL.  RESP: CTAB CV: NR & RR, no m/r/g GI: NT, ND, +BS, soft MSK: No LE edema  Assessment & Plan:   See Encounters Tab for problem based charting.  Patient discussed with Dr. Ellison Carwin, MD Internal Medicine, PGY-1

## 2017-07-14 NOTE — Assessment & Plan Note (Signed)
Assessment Lower left eyelid is red and irritated. The eye itself appears normal and there is no discharge, foreign body sensation, or visual change. She denies any pain or foreign body sensation. She states that she does not rub her eye, but will occasionally rub just below her eye. She was previously seen for this in 2016, when she was using artificial tears, which she is no longer using. At that time, she was referred to ophthalmology. I'm not sure that she ever made it to this appointment, and she does not remember. Given that she is asymptomatic and the eye itself appears normal, I think we can hold off on surgery for now and manage conservatively.  Plan - Restart artificial tears 1-2 drops daily when necessary - Can consider ophthalmology referral if worsens

## 2017-07-14 NOTE — Assessment & Plan Note (Addendum)
Assessment Blood pressure today is 161/55, HR 65. Repeat was 142/82. Antihypertensives include losartan 100 mg daily, bisoprolol 2.5 mg daily, Lasix 40 mg daily, and isosorbide mononitrate 30 mg daily. She reports compliance with these medications. She denies any symptoms. She states that she is trying to limit the amount of salt that she adds to her diet. She does not check her blood pressure at home.  Plan - Continue losartan, bisoprolol, Lasix, and isosorbide mononitrate - Encouraged her to continue watching the amount of salt that she adds to her food. - Follow up in 3 months

## 2017-07-15 LAB — VITAMIN B12: Vitamin B-12: 180 pg/mL — ABNORMAL LOW (ref 232–1245)

## 2017-07-15 NOTE — Progress Notes (Signed)
Internal Medicine Clinic Attending  Case discussed with Dr. Huang at the time of the visit.  We reviewed the resident's history and exam and pertinent patient test results.  I agree with the assessment, diagnosis, and plan of care documented in the resident's note. 

## 2017-07-17 NOTE — Addendum Note (Signed)
Addended by: Oretha Caprice on: 07/17/2017 05:18 PM   Modules accepted: Level of Service

## 2017-07-22 ENCOUNTER — Other Ambulatory Visit: Payer: Self-pay

## 2017-07-22 ENCOUNTER — Emergency Department (HOSPITAL_COMMUNITY): Payer: Medicare Other

## 2017-07-22 ENCOUNTER — Inpatient Hospital Stay (HOSPITAL_COMMUNITY)
Admission: EM | Admit: 2017-07-22 | Discharge: 2017-07-28 | DRG: 194 | Disposition: A | Discharge status: Skilled Nursing Facility | Payer: Medicare Other | Attending: Family Medicine | Admitting: Family Medicine

## 2017-07-22 ENCOUNTER — Encounter (HOSPITAL_COMMUNITY): Payer: Self-pay | Admitting: Emergency Medicine

## 2017-07-22 DIAGNOSIS — I5020 Unspecified systolic (congestive) heart failure: Secondary | ICD-10-CM

## 2017-07-22 DIAGNOSIS — D61818 Other pancytopenia: Secondary | ICD-10-CM | POA: Diagnosis present

## 2017-07-22 DIAGNOSIS — I5040 Unspecified combined systolic (congestive) and diastolic (congestive) heart failure: Secondary | ICD-10-CM | POA: Diagnosis not present

## 2017-07-22 DIAGNOSIS — Z7982 Long term (current) use of aspirin: Secondary | ICD-10-CM | POA: Diagnosis not present

## 2017-07-22 DIAGNOSIS — I130 Hypertensive heart and chronic kidney disease with heart failure and stage 1 through stage 4 chronic kidney disease, or unspecified chronic kidney disease: Secondary | ICD-10-CM | POA: Diagnosis present

## 2017-07-22 DIAGNOSIS — Z86010 Personal history of colonic polyps: Secondary | ICD-10-CM

## 2017-07-22 DIAGNOSIS — J101 Influenza due to other identified influenza virus with other respiratory manifestations: Secondary | ICD-10-CM | POA: Diagnosis present

## 2017-07-22 DIAGNOSIS — I509 Heart failure, unspecified: Secondary | ICD-10-CM

## 2017-07-22 DIAGNOSIS — K219 Gastro-esophageal reflux disease without esophagitis: Secondary | ICD-10-CM | POA: Diagnosis present

## 2017-07-22 DIAGNOSIS — I351 Nonrheumatic aortic (valve) insufficiency: Secondary | ICD-10-CM | POA: Diagnosis not present

## 2017-07-22 DIAGNOSIS — M6281 Muscle weakness (generalized): Secondary | ICD-10-CM | POA: Diagnosis not present

## 2017-07-22 DIAGNOSIS — Z79899 Other long term (current) drug therapy: Secondary | ICD-10-CM | POA: Diagnosis not present

## 2017-07-22 DIAGNOSIS — I2510 Atherosclerotic heart disease of native coronary artery without angina pectoris: Secondary | ICD-10-CM | POA: Diagnosis present

## 2017-07-22 DIAGNOSIS — R262 Difficulty in walking, not elsewhere classified: Secondary | ICD-10-CM | POA: Diagnosis not present

## 2017-07-22 DIAGNOSIS — N183 Chronic kidney disease, stage 3 (moderate): Secondary | ICD-10-CM | POA: Diagnosis present

## 2017-07-22 DIAGNOSIS — E785 Hyperlipidemia, unspecified: Secondary | ICD-10-CM | POA: Diagnosis present

## 2017-07-22 DIAGNOSIS — E876 Hypokalemia: Secondary | ICD-10-CM | POA: Diagnosis present

## 2017-07-22 DIAGNOSIS — I10 Essential (primary) hypertension: Secondary | ICD-10-CM | POA: Diagnosis present

## 2017-07-22 DIAGNOSIS — I5022 Chronic systolic (congestive) heart failure: Secondary | ICD-10-CM | POA: Diagnosis not present

## 2017-07-22 DIAGNOSIS — R05 Cough: Secondary | ICD-10-CM | POA: Diagnosis not present

## 2017-07-22 DIAGNOSIS — I5032 Chronic diastolic (congestive) heart failure: Secondary | ICD-10-CM | POA: Diagnosis present

## 2017-07-22 DIAGNOSIS — M1990 Unspecified osteoarthritis, unspecified site: Secondary | ICD-10-CM | POA: Diagnosis not present

## 2017-07-22 DIAGNOSIS — Z90710 Acquired absence of both cervix and uterus: Secondary | ICD-10-CM

## 2017-07-22 DIAGNOSIS — Z87891 Personal history of nicotine dependence: Secondary | ICD-10-CM | POA: Diagnosis not present

## 2017-07-22 DIAGNOSIS — Z8673 Personal history of transient ischemic attack (TIA), and cerebral infarction without residual deficits: Secondary | ICD-10-CM

## 2017-07-22 DIAGNOSIS — R488 Other symbolic dysfunctions: Secondary | ICD-10-CM | POA: Diagnosis not present

## 2017-07-22 DIAGNOSIS — N179 Acute kidney failure, unspecified: Secondary | ICD-10-CM | POA: Diagnosis present

## 2017-07-22 DIAGNOSIS — E559 Vitamin D deficiency, unspecified: Secondary | ICD-10-CM | POA: Diagnosis not present

## 2017-07-22 DIAGNOSIS — I129 Hypertensive chronic kidney disease with stage 1 through stage 4 chronic kidney disease, or unspecified chronic kidney disease: Secondary | ICD-10-CM | POA: Diagnosis not present

## 2017-07-22 DIAGNOSIS — R0602 Shortness of breath: Secondary | ICD-10-CM | POA: Diagnosis not present

## 2017-07-22 LAB — URINALYSIS, ROUTINE W REFLEX MICROSCOPIC
BILIRUBIN URINE: NEGATIVE
Glucose, UA: NEGATIVE mg/dL
KETONES UR: 5 mg/dL — AB
LEUKOCYTES UA: NEGATIVE
NITRITE: NEGATIVE
PH: 6 (ref 5.0–8.0)
Protein, ur: 30 mg/dL — AB
SPECIFIC GRAVITY, URINE: 1.011 (ref 1.005–1.030)

## 2017-07-22 LAB — BASIC METABOLIC PANEL
Anion gap: 6 (ref 5–15)
BUN: 20 mg/dL (ref 6–20)
CO2: 23 mmol/L (ref 22–32)
CREATININE: 0.97 mg/dL (ref 0.44–1.00)
Calcium: 9 mg/dL (ref 8.9–10.3)
Chloride: 112 mmol/L — ABNORMAL HIGH (ref 101–111)
GFR calc Af Amer: 60 mL/min — ABNORMAL LOW (ref >60)
GFR, EST NON AFRICAN AMERICAN: 51 mL/min — AB (ref >60)
Glucose, Bld: 104 mg/dL — ABNORMAL HIGH (ref 65–99)
Potassium: 3.4 mmol/L — ABNORMAL LOW (ref 3.5–5.1)
SODIUM: 141 mmol/L (ref 135–145)

## 2017-07-22 LAB — CBC WITH DIFFERENTIAL/PLATELET
BASOS ABS: 0 10*3/uL (ref 0.0–0.1)
Basophils Relative: 0 %
EOS ABS: 0 10*3/uL (ref 0.0–0.7)
Eosinophils Relative: 0 %
HCT: 30.9 % — ABNORMAL LOW (ref 36.0–46.0)
Hemoglobin: 10.3 g/dL — ABNORMAL LOW (ref 12.0–15.0)
Lymphocytes Relative: 13 %
Lymphs Abs: 0.4 10*3/uL — ABNORMAL LOW (ref 0.7–4.0)
MCH: 31.6 pg (ref 26.0–34.0)
MCHC: 33.3 g/dL (ref 30.0–36.0)
MCV: 94.8 fL (ref 78.0–100.0)
Monocytes Absolute: 0.2 10*3/uL (ref 0.1–1.0)
Monocytes Relative: 5 %
Neutro Abs: 2.6 10*3/uL (ref 1.7–7.7)
Neutrophils Relative %: 82 %
PLATELETS: 91 10*3/uL — AB (ref 150–400)
RBC: 3.26 MIL/uL — AB (ref 3.87–5.11)
RDW: 13.2 % (ref 11.5–15.5)
WBC: 3.1 10*3/uL — AB (ref 4.0–10.5)

## 2017-07-22 LAB — I-STAT CG4 LACTIC ACID, ED: Lactic Acid, Venous: 0.98 mmol/L (ref 0.5–1.9)

## 2017-07-22 LAB — INFLUENZA PANEL BY PCR (TYPE A & B)
INFLBPCR: NEGATIVE
Influenza A By PCR: POSITIVE — AB

## 2017-07-22 LAB — BRAIN NATRIURETIC PEPTIDE: B NATRIURETIC PEPTIDE 5: 342.7 pg/mL — AB (ref 0.0–100.0)

## 2017-07-22 LAB — TROPONIN I: Troponin I: <0.03 ng/mL (ref <0.03)

## 2017-07-22 MED ORDER — OSELTAMIVIR PHOSPHATE 30 MG PO CAPS
30.0000 mg | ORAL_CAPSULE | Freq: Two times a day (BID) | ORAL | Status: DC
  Administered 2017-07-22 – 2017-07-24 (×5): 30 mg via ORAL
  Filled 2017-07-22 (×7): qty 1

## 2017-07-22 MED ORDER — PROPYLENE GLYCOL-GLYCERIN 1-0.3 % OP SOLN: 1.0000 [drp] | Freq: Every day | OPHTHALMIC | Status: DC | PRN

## 2017-07-22 MED ORDER — ALBUTEROL SULFATE (2.5 MG/3ML) 0.083% IN NEBU: 2.5000 mg | INHALATION_SOLUTION | RESPIRATORY_TRACT | Status: DC | PRN

## 2017-07-22 MED ORDER — ISOSORBIDE MONONITRATE ER 30 MG PO TB24
30.0000 mg | ORAL_TABLET | Freq: Every day | ORAL | Status: DC
  Administered 2017-07-22 – 2017-07-28 (×7): 30 mg via ORAL
  Filled 2017-07-22 (×8): qty 1

## 2017-07-22 MED ORDER — POLYETHYLENE GLYCOL 3350 17 G PO PACK: 17.0000 g | PACK | Freq: Every day | ORAL | Status: DC | PRN

## 2017-07-22 MED ORDER — LOSARTAN POTASSIUM 50 MG PO TABS
100.0000 mg | ORAL_TABLET | Freq: Every day | ORAL | Status: DC
  Administered 2017-07-22 – 2017-07-28 (×6): 100 mg via ORAL
  Filled 2017-07-22 (×8): qty 2

## 2017-07-22 MED ORDER — VITAMIN B-12 1000 MCG PO TABS
1000.0000 ug | ORAL_TABLET | Freq: Every day | ORAL | Status: DC
  Administered 2017-07-22 – 2017-07-28 (×7): 1000 ug via ORAL
  Filled 2017-07-22 (×8): qty 1

## 2017-07-22 MED ORDER — IPRATROPIUM-ALBUTEROL 0.5-2.5 (3) MG/3ML IN SOLN
3.0000 mL | Freq: Three times a day (TID) | RESPIRATORY_TRACT | Status: DC
  Administered 2017-07-22 – 2017-07-23 (×4): 3 mL via RESPIRATORY_TRACT
  Filled 2017-07-22 (×4): qty 3

## 2017-07-22 MED ORDER — BISOPROLOL FUMARATE 5 MG PO TABS
2.5000 mg | ORAL_TABLET | Freq: Every day | ORAL | Status: DC
  Administered 2017-07-22 – 2017-07-28 (×6): 2.5 mg via ORAL
  Filled 2017-07-22 (×3): qty 1
  Filled 2017-07-22: qty 0.5
  Filled 2017-07-22 (×4): qty 1

## 2017-07-22 MED ORDER — ACETAMINOPHEN 325 MG PO TABS
650.0000 mg | ORAL_TABLET | Freq: Once | ORAL | Status: AC
  Administered 2017-07-22: 650 mg via ORAL
  Filled 2017-07-22: qty 2

## 2017-07-22 MED ORDER — ACETAMINOPHEN 325 MG PO TABS: 650.0000 mg | ORAL_TABLET | Freq: Four times a day (QID) | ORAL | Status: DC | PRN

## 2017-07-22 MED ORDER — ACETAMINOPHEN 325 MG PO TABS
650.0000 mg | ORAL_TABLET | Freq: Four times a day (QID) | ORAL | Status: DC | PRN
  Administered 2017-07-23 (×2): 650 mg via ORAL
  Filled 2017-07-22 (×2): qty 2

## 2017-07-22 MED ORDER — ASPIRIN EC 81 MG PO TBEC
81.0000 mg | DELAYED_RELEASE_TABLET | Freq: Every day | ORAL | Status: DC
  Administered 2017-07-22 – 2017-07-28 (×7): 81 mg via ORAL
  Filled 2017-07-22 (×8): qty 1

## 2017-07-22 MED ORDER — SODIUM CHLORIDE 0.9 % IV SOLN: 250.0000 mL | INTRAVENOUS | Status: DC | PRN

## 2017-07-22 MED ORDER — FUROSEMIDE 40 MG PO TABS
40.0000 mg | ORAL_TABLET | Freq: Every day | ORAL | Status: DC
  Administered 2017-07-22 – 2017-07-24 (×3): 40 mg via ORAL
  Filled 2017-07-22 (×3): qty 1

## 2017-07-22 MED ORDER — GUAIFENESIN ER 600 MG PO TB12
600.0000 mg | ORAL_TABLET | Freq: Two times a day (BID) | ORAL | Status: DC
  Administered 2017-07-22 – 2017-07-28 (×12): 600 mg via ORAL
  Filled 2017-07-22 (×12): qty 1

## 2017-07-22 MED ORDER — SODIUM CHLORIDE 0.9% FLUSH: 3.0000 mL | INTRAVENOUS | Status: DC | PRN

## 2017-07-22 MED ORDER — POLYVINYL ALCOHOL 1.4 % OP SOLN
1.0000 [drp] | OPHTHALMIC | Status: DC | PRN
  Administered 2017-07-26 – 2017-07-28 (×4): 2 [drp] via OPHTHALMIC
  Filled 2017-07-22: qty 15

## 2017-07-22 MED ORDER — ONDANSETRON HCL 4 MG PO TABS: 4.0000 mg | ORAL_TABLET | Freq: Four times a day (QID) | ORAL | Status: DC | PRN

## 2017-07-22 MED ORDER — FUROSEMIDE 10 MG/ML IJ SOLN
40.0000 mg | Freq: Two times a day (BID) | INTRAMUSCULAR | Status: DC
  Administered 2017-07-22: 40 mg via INTRAVENOUS
  Filled 2017-07-22: qty 4

## 2017-07-22 MED ORDER — SENNA 8.6 MG PO TABS
1.0000 | ORAL_TABLET | Freq: Two times a day (BID) | ORAL | Status: DC
  Administered 2017-07-22 – 2017-07-28 (×12): 8.6 mg via ORAL
  Filled 2017-07-22 (×12): qty 1

## 2017-07-22 MED ORDER — HYDRALAZINE HCL 20 MG/ML IJ SOLN: 10.0000 mg | Freq: Four times a day (QID) | INTRAMUSCULAR | Status: DC | PRN

## 2017-07-22 MED ORDER — SODIUM CHLORIDE 0.9% FLUSH
3.0000 mL | Freq: Two times a day (BID) | INTRAVENOUS | Status: DC
  Administered 2017-07-22 – 2017-07-28 (×9): 3 mL via INTRAVENOUS

## 2017-07-22 MED ORDER — ONDANSETRON HCL 4 MG/2ML IJ SOLN: 4.0000 mg | Freq: Four times a day (QID) | INTRAMUSCULAR | Status: DC | PRN

## 2017-07-22 MED ORDER — FUROSEMIDE 10 MG/ML IJ SOLN
40.0000 mg | Freq: Once | INTRAMUSCULAR | Status: AC
  Administered 2017-07-22: 40 mg via INTRAVENOUS
  Filled 2017-07-22: qty 4

## 2017-07-22 MED ORDER — ATORVASTATIN CALCIUM 40 MG PO TABS
40.0000 mg | ORAL_TABLET | Freq: Every day | ORAL | Status: DC
  Administered 2017-07-22 – 2017-07-25 (×4): 40 mg via ORAL
  Filled 2017-07-22 (×5): qty 1

## 2017-07-22 MED ORDER — HEPARIN SODIUM (PORCINE) 5000 UNIT/ML IJ SOLN
5000.0000 [IU] | Freq: Three times a day (TID) | INTRAMUSCULAR | Status: DC
  Filled 2017-07-22: qty 1

## 2017-07-22 MED ORDER — TRAZODONE HCL 50 MG PO TABS
50.0000 mg | ORAL_TABLET | Freq: Every evening | ORAL | Status: DC | PRN
  Administered 2017-07-23 – 2017-07-25 (×2): 50 mg via ORAL
  Filled 2017-07-22 (×2): qty 1

## 2017-07-22 MED ORDER — ACETAMINOPHEN 650 MG RE SUPP: 650.0000 mg | Freq: Four times a day (QID) | RECTAL | Status: DC | PRN

## 2017-07-22 NOTE — ED Triage Notes (Signed)
Pt comes to ed via ems, c/o sob with cough. Pt got up tried to go to bathroom and was sob and walking. Pt hx of HTN .  Pt v/s are 169/76, pluse 90, rr16, spo2 97.  Room air. Pt comes from home.

## 2017-07-22 NOTE — ED Provider Notes (Signed)
[facility] DEPT Provider Note   CSN: [id number] Arrival date & time: 07/22/17  5784     History   Chief Complaint Chief Complaint  Patient presents with  . Cough  . Shortness of Breath  . Eye Problem    HPI Jane [name] is a 84 y.o. female.  84 year old female who presents with worsening cough as well as dyspnea on exertion.  No associated chest pain but does have a history of CHF and states that she has had some orthopnea.  Slight headache but no myalgias.  No vomiting or diarrhea.  Denies any urinary symptoms.  Symptoms better with rest and patient called EMS and was transported here.  No treatment used for this prior to arrival.      Past Medical History:  Diagnosis Date  . Anemia   . Aortic insufficiency    mild  . Arthritis    "arms" (08/09/2015)  . CAD (coronary artery disease)    cath 08/09/2014 95% stenosis in prox to mid RCA s/p DES, 80-90% prox OM2, 50% distal LAD  . CHF (congestive heart failure) ([place])   . CKD (chronic kidney disease) stage 3, GFR 30-59 ml/min (HCC)   . Colon polyp    2009 colonoscopy, not retrieved for pathology  . Ectropion of left lower eyelid   . GERD (gastroesophageal reflux disease) 2009   EGD with benign gastric polyp too  . Hyperlipidemia   . Hypertension   . Stroke ([place]) 1975  . Vitamin D deficiency     Patient Active Problem List   Diagnosis Date Noted  . Need for immunization against influenza 03/24/2017  . Unsteady gait 08/11/2016  . Abdominal aortic aneurysm ([place]) 05/21/2016  . Trochanteric bursitis of left hip   . CKD (chronic kidney disease) stage 3, GFR 30-59 ml/min (HCC) 03/31/2015  . Prediabetes 10/16/2014  . Status post insertion of drug-eluting stent into right coronary artery for coronary artery disease 08/07/2014  . Dyspnea 05/31/2014  . Physical deconditioning 05/31/2014  . Heart failure with preserved ejection fraction ([place]) 12/27/2012  . Ectropion of left lower eyelid  12/17/2012  . Onychomycosis of toenail 09/18/2012  . PAD (peripheral artery disease) ([place]) 09/18/2012  . Preventative health care 09/18/2012  . Vitamin D deficiency 04/25/2008  . C V A / STROKE 10/29/2007  . Hyperlipidemia 09/24/2006  . Normocytic anemia 09/24/2006  . Essential hypertension 09/24/2006  . OSTEOPENIA 09/24/2006  . Vitamin B12 deficiency 08/29/2006    Past Surgical History:  Procedure Laterality Date  . ABDOMINAL HYSTERECTOMY    . APPENDECTOMY    . ARTERY BIOPSY Left 12/16/2012   Procedure: BIOPSY TEMPORAL ARTERY;  Surgeon: Mal Misty, MD;  Location: [facility];  Service: Vascular;  Laterality: Left;  . CARDIAC CATHETERIZATION    . LEFT HEART CATHETERIZATION WITH CORONARY ANGIOGRAM N/A 08/09/2014   Procedure: LEFT HEART CATHETERIZATION WITH CORONARY ANGIOGRAM;  Surgeon: Peter M Martinique, MD;  Location[facility] CATH LAB;  Service: Cardiovascular;  Laterality: N/A;  . PERCUTANEOUS CORONARY STENT INTERVENTION (PCI-S)  08/09/2014   Procedure: PERCUTANEOUS CORONARY STENT INTERVENTION (PCI-S);  Surgeon: Peter M Martinique, MD;  Location: [facility] CATH LAB;  Service: Cardiovascular;;  . TONSILLECTOMY      OB History    No data available       Home Medications    Prior to Admission medications   Medication Sig Start Date End Date Taking? Authorizing Provider  acetaminophen (TYLENOL) 325 MG tablet Take 650 mg by mouth every 6 (six)  hours as needed for mild pain, moderate pain or headache.     [provider]  aspirin EC 81 MG tablet Take 81 mg by mouth daily.    [provider]  atorvastatin (LIPITOR) 40 MG tablet Take 1 tablet (40 mg total) by mouth daily at 6 PM. 08/15/16   Milagros Loll, MD  bisoprolol (ZEBETA) 5 MG tablet Take 0.5 tablets (2.5 mg total) by mouth daily. 08/15/16   Milagros Loll, MD  furosemide (LASIX) 40 MG tablet Take 1 tablet (40 mg total) by mouth daily. 08/15/16   Milagros Loll, MD  isosorbide mononitrate (IMDUR) 30 MG 24 hr tablet Take 1  tablet (30 mg total) by mouth daily. 08/15/16   Milagros Loll, MD  losartan (COZAAR) 100 MG tablet Take 1 tablet (100 mg total) by mouth daily. 03/24/17   Lacroce, Hulen Shouts, MD  Propylene Glycol-Glycerin (ARTIFICIAL TEARS) 1-0.3 % SOLN Apply 1-2 drops to eye daily as needed (dry eyes). 07/14/17   Colbert Ewing, MD  vitamin B-12 (CYANOCOBALAMIN) 1000 MCG tablet Take 1 tablet (1,000 mcg total) by mouth daily. 08/16/16   Milagros Loll, MD    Family History Family History  Problem Relation Age of Onset  . Stroke Mother   . Hypertension Mother   . Stroke Father   . Hypertension Father   . Diabetes Sister     Social History Social History   Tobacco Use  . Smoking status: Former Smoker    Types: Cigarettes    Last attempt to quit: 07/02/1979    Years since quitting: 38.0  . Smokeless tobacco: Former Systems developer  . Tobacco comment: Started in teenage years - Quit 1980  Substance Use Topics  . Alcohol use: No    Alcohol/week: 0.0 oz    Comment: Quit alcohol 1975-76  . Drug use: No     Allergies   Patient has no known allergies.   Review of Systems Review of Systems  All other systems reviewed and are negative.    Physical Exam Updated Vital Signs BP (!) 159/86 (BP Location: Left Arm)   Pulse 73   Temp 99.8 F (37.7 C) (Oral)   Resp 19   Ht 1.626 m (5\' 4" )   Wt 73.9 kg (163 lb)   SpO2 95%   BMI 27.98 kg/m   Physical Exam  Constitutional: She is oriented to person, place, and time. She appears well-developed and well-nourished.  Non-toxic appearance. No distress.  HENT:  Head: Normocephalic and atraumatic.  Eyes: Conjunctivae, EOM and lids are normal. Pupils are equal, round, and reactive to light.  Neck: Normal range of motion. Neck supple. No tracheal deviation present. No thyroid mass present.  Cardiovascular: Normal rate, regular rhythm and normal heart sounds. Exam reveals no gallop.  No murmur heard. Pulmonary/Chest: Effort normal. No stridor. Tachypnea  noted. No respiratory distress. She has decreased breath sounds in the right lower field and the left lower field. She has no wheezes. She has no rhonchi. She has no rales.  Abdominal: Soft. Normal appearance and bowel sounds are normal. She exhibits no distension. There is no tenderness. There is no rebound and no CVA tenderness.  Musculoskeletal: Normal range of motion. She exhibits no edema or tenderness.  Neurological: She is alert and oriented to person, place, and time. She has normal strength. No cranial nerve deficit or sensory deficit. GCS eye subscore is 4. GCS verbal subscore is 5. GCS motor subscore is 6.  Skin: Skin is warm  and dry. No abrasion and no rash noted.  Psychiatric: She has a normal mood and affect. Her speech is normal and behavior is normal.  Nursing note and vitals reviewed.    ED Treatments / Results  Labs (all labs ordered are listed, but only abnormal results are displayed) Labs Reviewed  CBC WITH DIFFERENTIAL/PLATELET - Abnormal; Notable for the following components:      Result Value   WBC 3.1 (*)    RBC 3.26 (*)    Hemoglobin 10.3 (*)    HCT 30.9 (*)    Lymphs Abs 0.4 (*)    All other components within normal limits  CULTURE, BLOOD (ROUTINE X 2)  CULTURE, BLOOD (ROUTINE X 2)  BASIC METABOLIC PANEL  TROPONIN I  BRAIN NATRIURETIC PEPTIDE  URINALYSIS, ROUTINE W REFLEX MICROSCOPIC  INFLUENZA PANEL BY PCR (TYPE A & B)  I-STAT CG4 LACTIC ACID, ED    EKG  EKG Interpretation None       Radiology No results found.  Procedures Procedures (including critical care time)  Medications Ordered in ED Medications - No data to display   Initial Impression / Assessment and Plan / ED Course  I have reviewed the triage vital signs and the nursing notes.  Pertinent labs & imaging results that were available during my care of the patient were reviewed by me and considered in my medical decision making (see chart for details).     Patient with evidence  of CHF on chest x-ray and treated with Lasix 40 mg IV push.  Will be admitted to the hospitalist service  Final Clinical Impressions(s) / ED Diagnoses   Final diagnoses:  None    ED Discharge Orders    None       Lacretia Leigh, MD 07/22/17 [phone]

## 2017-07-22 NOTE — H&P (Signed)
Patient Demographics:    Jane [name], is a 84 y.o. female  MRN: [id number]   DOB - 15-Mar-1929  Admit Date - 07/22/2017  Outpatient Primary MD for the patient is Colbert Ewing, MD   Assessment & Plan:    Principal Problem:   Influenza A Active Problems:   Essential hypertension   CHF (congestive heart failure) ([place])    1)Influenza A-Tamiflu 30 mg twice daily as ordered, mucolytics bronchodilators as ordered, pancytopenia noted but this is not new, lactic acid is not elevated,  2)HFpEF-patient with history of chronic diastolic dysfunction CHF, last known EF around 60%, shortness of breath may be more related to #1 above, rather than CHF exacerbation at this time, troponin negative, BNP 342, chest x-ray noted, repeat echocardiogram pending, continue Lasix 40 mg daily   3)Disposition-patient with generalized weakness, get PT eval prior to discharge  4)CAD/HTN-stable, no ACS type symptoms, troponin negative, continue aspirin 81 mg daily, Lipitor 40 mg daily, bisoprolol 2.5 mg daily, also continue isosorbide and losartan,   may use IV Hydralazine 10 mg  Every 4 hours Prn for systolic blood pressure over 160 mmhg   With History of - Reviewed by me  Past Medical History:  Diagnosis Date  . Anemia   . Aortic insufficiency    mild  . Arthritis    "arms" (08/09/2015)  . CAD (coronary artery disease)    cath 08/09/2014 95% stenosis in prox to mid RCA s/p DES, 80-90% prox OM2, 50% distal LAD  . CHF (congestive heart failure) ([place])   . CKD (chronic kidney disease) stage 3, GFR 30-59 ml/min (HCC)   . Colon polyp    2009 colonoscopy, not retrieved for pathology  . Ectropion of left lower eyelid   . GERD (gastroesophageal reflux disease) 2009   EGD with benign gastric polyp too  . Hyperlipidemia   . Hypertension    . Stroke ([place]) 1975  . Vitamin D deficiency       Past Surgical History:  Procedure Laterality Date  . ABDOMINAL HYSTERECTOMY    . APPENDECTOMY    . ARTERY BIOPSY Left 12/16/2012   Procedure: BIOPSY TEMPORAL ARTERY;  Surgeon: Mal Misty, MD;  Location: [facility];  Service: Vascular;  Laterality: Left;  . CARDIAC CATHETERIZATION    . LEFT HEART CATHETERIZATION WITH CORONARY ANGIOGRAM N/A 08/09/2014   Procedure: LEFT HEART CATHETERIZATION WITH CORONARY ANGIOGRAM;  Surgeon: Peter M Martinique, MD;  Location: [facility] CATH LAB;  Service: Cardiovascular;  Laterality: N/A;  . PERCUTANEOUS CORONARY STENT INTERVENTION (PCI-S)  08/09/2014   Procedure: PERCUTANEOUS CORONARY STENT INTERVENTION (PCI-S);  Surgeon: Peter M Martinique, MD;  Location: [facility] CATH LAB;  Service: Cardiovascular;;  . TONSILLECTOMY        Chief Complaint  Patient presents with  . Cough  . Shortness of Breath  . Eye Problem      HPI:    Jane [name]  is a 84 y.o. female with past medical  history relevant for CAD, preserved EF CHF (diastolic dysfunction with last known EF of 60%), and hypertension who presents  via ems with complaints of shortness of breath, cough, which is mostly dry.  Patient states when she got up to go to the bathroom she became very short of breath and had a difficult time getting back.  No leg pain, no leg swelling, no pleuritic symptoms  In ED... Pt v/s are 169/76, pluse 90, rr16, spo2 97. Room air. Pt comes from home.  Workup in the ED noted including chest x-ray suggesting possible CHF, BNP developed 342, no ACS type symptoms,  Flu test was positive for influenza A-  , lactic acid is not elevated, patient does have pancytopenia which is not new    Review of systems:    In addition to the HPI above,   A full 12 point Review of 10 Systems was done, except as stated above, all other Review of 10 Systems were negative.    Social History:  Reviewed by me    Social History   Tobacco Use  . Smoking  status: Former Smoker    Types: Cigarettes    Last attempt to quit: 07/02/1979    Years since quitting: 38.0  . Smokeless tobacco: Former Systems developer  . Tobacco comment: Started in teenage years - Quit 1980  Substance Use Topics  . Alcohol use: No    Alcohol/week: 0.0 oz    Comment: Quit alcohol 1975-76       Family History :  Reviewed by me    Family History  Problem Relation Age of Onset  . Stroke Mother   . Hypertension Mother   . Stroke Father   . Hypertension Father   . Diabetes Sister     Home Medications:   Prior to Admission medications   Medication Sig Start Date End Date Taking? Authorizing Provider  acetaminophen (TYLENOL) 325 MG tablet Take 650 mg by mouth every 6 (six) hours as needed for mild pain, moderate pain or headache.    Yes [provider]  aspirin EC 81 MG tablet Take 81 mg by mouth daily.   Yes [provider]  atorvastatin (LIPITOR) 40 MG tablet Take 1 tablet (40 mg total) by mouth daily at 6 PM. 08/15/16  Yes Milagros Loll, MD  bisoprolol (ZEBETA) 5 MG tablet Take 0.5 tablets (2.5 mg total) by mouth daily. 08/15/16  Yes Milagros Loll, MD  furosemide (LASIX) 40 MG tablet Take 1 tablet (40 mg total) by mouth daily. 08/15/16  Yes Milagros Loll, MD  isosorbide mononitrate (IMDUR) 30 MG 24 hr tablet Take 1 tablet (30 mg total) by mouth daily. 08/15/16  Yes Milagros Loll, MD  losartan (COZAAR) 100 MG tablet Take 1 tablet (100 mg total) by mouth daily. 03/24/17  Yes Lacroce, Hulen Shouts, MD  meloxicam (MOBIC) 7.5 MG tablet Take 7.5 mg by mouth daily. 06/12/17  Yes [provider]  Propylene Glycol-Glycerin (ARTIFICIAL TEARS) 1-0.3 % SOLN Apply 1-2 drops to eye daily as needed (dry eyes). 07/14/17  Yes Colbert Ewing, MD  vitamin B-12 (CYANOCOBALAMIN) 1000 MCG tablet Take 1 tablet (1,000 mcg total) by mouth daily. 08/16/16  Yes Milagros Loll, MD     Allergies:    No Known Allergies   Physical Exam:   Vitals  Blood  pressure (!) 154/74, pulse 77, temperature 100 F (37.8 C), temperature source Oral, resp. rate 18, height 5\' 4"  (1.626 m), weight 73.9 kg (163 lb), SpO2  98 %.  Physical Examination: General appearance - alert, well appearing, and in no distress Mental status - alert, oriented to person, place, and time,  Eyes - sclera anicteric Neck - supple, no JVD elevation , Chest -diminished breath sounds in bases, scattered rhonchi especially on the right  heart - S1 and S2 normal,  Abdomen - soft, nontender, nondistended, no CVA tenderness neurological - screening mental status exam normal, neck supple without rigidity, cranial nerves II through XII intact, DTR's normal and symmetric, patient complains of generalized weakness without new focal deficits Extremities - no pedal edema noted, intact peripheral pulses  Skin - warm, dry Psych-appropriate affect   Data Review:    CBC Recent Labs  Lab 07/22/17 0644  WBC 3.1*  HGB 10.3*  HCT 30.9*  PLT 91*  MCV 94.8  MCH 31.6  MCHC 33.3  RDW 13.2  LYMPHSABS 0.4*  MONOABS 0.2  EOSABS 0.0  BASOSABS 0.0   ------------------------------------------------------------------------------------------------------------------  Chemistries  Recent Labs  Lab 07/22/17 0644  NA 141  K 3.4*  CL 112*  CO2 23  GLUCOSE 104*  BUN 20  CREATININE 0.97  CALCIUM 9.0   ------------------------------------------------------------------------------------------------------------------ estimated creatinine clearance is 41 mL/min (by C-G formula based on SCr of 0.97 mg/dL). ------------------------------------------------------------------------------------------------------------------ No results for input(s): TSH, T4TOTAL, T3FREE, THYROIDAB in the last 72 hours.  Invalid input(s): FREET3   Coagulation profile No results for input(s): INR, PROTIME in the last 168  hours. ------------------------------------------------------------------------------------------------------------------- No results for input(s): DDIMER in the last 72 hours. -------------------------------------------------------------------------------------------------------------------  Cardiac Enzymes Recent Labs  Lab 07/22/17 0644  TROPONINI <0.03   ------------------------------------------------------------------------------------------------------------------    Component Value Date/Time   BNP 342.7 (H) 07/22/2017 0644     ---------------------------------------------------------------------------------------------------------------  Urinalysis    Component Value Date/Time   COLORURINE YELLOW 07/22/2017 1400   APPEARANCEUR CLEAR 07/22/2017 1400   LABSPEC 1.011 07/22/2017 1400   PHURINE 6.0 07/22/2017 1400   GLUCOSEU NEGATIVE 07/22/2017 1400   HGBUR SMALL (A) 07/22/2017 1400   BILIRUBINUR NEGATIVE 07/22/2017 1400   KETONESUR 5 (A) 07/22/2017 1400   PROTEINUR 30 (A) 07/22/2017 1400   UROBILINOGEN 0.2 02/05/2015 0555   NITRITE NEGATIVE 07/22/2017 1400   LEUKOCYTESUR NEGATIVE 07/22/2017 1400    ----------------------------------------------------------------------------------------------------------------   Imaging Results:    Dg Chest 2 View  Result Date: 07/22/2017 CLINICAL DATA:  Shortness breath and cough. EXAM: CHEST  2 VIEW COMPARISON:  10/22/2016 FINDINGS: Lungs are adequately inflated without focal airspace consolidation or effusion. There is minimal prominence of the perihilar markings suggesting mild vascular congestion. Stable cardiomegaly. Remainder the exam is unchanged. IMPRESSION: Mild stable cardiomegaly with suggestion mild vascular congestion. Electronically Signed   By: Marin Olp M.D.   On: 07/22/2017 07:52    Radiological Exams on Admission: Dg Chest 2 View  Result Date: 07/22/2017 CLINICAL DATA:  Shortness breath and cough. EXAM: CHEST   2 VIEW COMPARISON:  10/22/2016 FINDINGS: Lungs are adequately inflated without focal airspace consolidation or effusion. There is minimal prominence of the perihilar markings suggesting mild vascular congestion. Stable cardiomegaly. Remainder the exam is unchanged. IMPRESSION: Mild stable cardiomegaly with suggestion mild vascular congestion. Electronically Signed   By: Marin Olp M.D.   On: 07/22/2017 07:52    DVT Prophylaxis -SCD  (low platelets) AM Labs Ordered, also please review Full Orders  Family Communication: Admission, patients condition and plan of care including tests being ordered have been discussed with the patient who indicate understanding and agree with the plan   Code Status - Full Code  Likely DC to  home  Condition   stable  Roxan Hockey M.D on 07/22/2017 at 9:07 PM   Between 7am to 7pm - Pager - [phone] After 7pm go to [email or web address] - password [email or web address]  [facility] - Office  [phone]  Voice Recognition Viviann Spare dictation system was used to create this note, attempts have been made to correct errors. Please contact the author with questions and/or clarifications.

## 2017-07-22 NOTE — ED Notes (Signed)
Bed: RESB Expected date:  Expected time:  Means of arrival:  Comments: EMS SOB

## 2017-07-23 ENCOUNTER — Inpatient Hospital Stay (HOSPITAL_COMMUNITY): Payer: Medicare Other

## 2017-07-23 DIAGNOSIS — J101 Influenza due to other identified influenza virus with other respiratory manifestations: Principal | ICD-10-CM

## 2017-07-23 DIAGNOSIS — I5040 Unspecified combined systolic (congestive) and diastolic (congestive) heart failure: Secondary | ICD-10-CM

## 2017-07-23 DIAGNOSIS — I10 Essential (primary) hypertension: Secondary | ICD-10-CM

## 2017-07-23 DIAGNOSIS — I351 Nonrheumatic aortic (valve) insufficiency: Secondary | ICD-10-CM

## 2017-07-23 LAB — BASIC METABOLIC PANEL
Anion gap: 8 (ref 5–15)
BUN: 24 mg/dL — ABNORMAL HIGH (ref 6–20)
CALCIUM: 8.7 mg/dL — AB (ref 8.9–10.3)
CO2: 24 mmol/L (ref 22–32)
Chloride: 106 mmol/L (ref 101–111)
Creatinine, Ser: 1.23 mg/dL — ABNORMAL HIGH (ref 0.44–1.00)
GFR, EST AFRICAN AMERICAN: 45 mL/min — AB (ref >60)
GFR, EST NON AFRICAN AMERICAN: 39 mL/min — AB (ref >60)
GLUCOSE: 107 mg/dL — AB (ref 65–99)
POTASSIUM: 3 mmol/L — AB (ref 3.5–5.1)
Sodium: 138 mmol/L (ref 135–145)

## 2017-07-23 LAB — CBC
HEMATOCRIT: 28.7 % — AB (ref 36.0–46.0)
HEMOGLOBIN: 9.7 g/dL — AB (ref 12.0–15.0)
MCH: 32.3 pg (ref 26.0–34.0)
MCHC: 33.8 g/dL (ref 30.0–36.0)
MCV: 95.7 fL (ref 78.0–100.0)
Platelets: 83 10*3/uL — ABNORMAL LOW (ref 150–400)
RBC: 3 MIL/uL — AB (ref 3.87–5.11)
RDW: 13.3 % (ref 11.5–15.5)
WBC: 2.3 10*3/uL — AB (ref 4.0–10.5)

## 2017-07-23 LAB — ECHOCARDIOGRAM COMPLETE
Height: 64 in
Weight: 2451.52 oz

## 2017-07-23 LAB — TROPONIN I

## 2017-07-23 MED ORDER — OXYCODONE HCL 5 MG PO TABS
5.0000 mg | ORAL_TABLET | Freq: Four times a day (QID) | ORAL | Status: DC | PRN
  Administered 2017-07-23 – 2017-07-24 (×2): 5 mg via ORAL
  Filled 2017-07-23 (×2): qty 1

## 2017-07-23 MED ORDER — IPRATROPIUM-ALBUTEROL 0.5-2.5 (3) MG/3ML IN SOLN
3.0000 mL | Freq: Two times a day (BID) | RESPIRATORY_TRACT | Status: DC
  Administered 2017-07-24 – 2017-07-28 (×9): 3 mL via RESPIRATORY_TRACT
  Filled 2017-07-23 (×9): qty 3

## 2017-07-23 MED ORDER — POTASSIUM CHLORIDE 10 MEQ/100ML IV SOLN
10.0000 meq | INTRAVENOUS | Status: AC
  Administered 2017-07-23 (×3): 10 meq via INTRAVENOUS
  Filled 2017-07-23 (×3): qty 100

## 2017-07-23 NOTE — Progress Notes (Signed)
[facility]  PROGRESS NOTE  Jane [name] KDT:[id number] DOB: Mar 25, 1929 DOA: 07/22/2017 PCP: Colbert Ewing, MD   Brief HPI:    84 y.o. female with past medical history relevant for CAD, preserved EF CHF (diastolic dysfunction with last known EF of 60%), and hypertension who presents  via ems with complaints of shortness of breath, cough, which is mostly dry.  Patient states when she got up to go to the bathroom she became very short of breath and had a difficult time getting back.  No leg pain, no leg swelling, no pleuritic symptoms      Subjective   Patient complains of left knee pain.    Assessment/Plan:     1. Influenza A- patient started on Tamiflu 30 mg twice a day. Continue medical ethics, bronchodilators PRN 2. Chronic diastolic CHF -patient last EF around 60%, troponin negative, BNP 342, continue Lasix 40 mg PO daily. 3. Hypokalemia-potassium is 3.0, will replace potassium and check BMP in am. 4. CAD/hypertension-stable, no ACS symptoms. Troponin negative. Continue aspirin 81 mg PO daily, Lipitor, metoprolol, isosorbide, losartan.    DVT prophylaxis: SCD's  Code Status: Full code  Family Communication: No family at bedside  Disposition Plan: Home in 1-2 days   Consultants:  None   Procedures:  None   Continuous infusions . sodium chloride        Antibiotics:   Anti-infectives (From admission, onward)   Start     Dose/Rate Route Frequency Ordered Stop   07/22/17 2200  oseltamivir (TAMIFLU) capsule 30 mg     30 mg Oral 2 times daily 07/22/17 1733 07/27/17 2159       Objective   Vitals:   07/23/17 0354 07/23/17 0945 07/23/17 1505 07/23/17 1548  BP: (!) 124/59  (!) 112/58   Pulse: 84 80 72 74  Resp: 20 18 19 18   Temp: 99.6 F (37.6 C)  99.1 F (37.3 C)   TempSrc: Oral  Oral   SpO2: 96% 91% 98% 96%  Weight: 69.5 kg (153 lb 3.5 oz)     Height:        Intake/Output Summary (Last 24 hours) at 07/23/2017 1701 Last data filed at  07/23/2017 1400 Gross per 24 hour  Intake 843 ml  Output 400 ml  Net 443 ml   Filed Weights   07/22/17 0614 07/23/17 0354  Weight: 73.9 kg (163 lb) 69.5 kg (153 lb 3.5 oz)     Physical Examination:  Physical Exam: Eyes: No icterus, extraocular muscles intact  Mouth: Oral mucosa is moist, no lesions on palate,  Neck: Supple, no deformities, masses, or tenderness Lungs: Normal respiratory effort, bilateral clear to auscultation, no crackles or wheezes.  Heart: Regular rate and rhythm, S1 and S2 normal, no murmurs, rubs auscultated Abdomen: BS normoactive,soft,nondistended,non-tender to palpation,no organomegaly Extremities: No pretibial edema, no erythema, no cyanosis, no clubbing Neuro : Alert and oriented to time, place and person, No focal deficits  Skin: No rashes seen on exam     Data Reviewed: I have personally reviewed following labs and imaging studies  CBG: No results for input(s): GLUCAP in the last 168 hours.  CBC: Recent Labs  Lab 07/22/17 0644 07/23/17 0242  WBC 3.1* 2.3*  NEUTROABS 2.6  --   HGB 10.3* 9.7*  HCT 30.9* 28.7*  MCV 94.8 95.7  PLT 91* 83*    Basic Metabolic Panel: Recent Labs  Lab 07/22/17 0644 07/23/17 0242  NA 141 138  K 3.4* 3.0*  CL 112* 106  CO2 23 24  GLUCOSE 104* 107*  BUN 20 24*  CREATININE 0.97 1.23*  CALCIUM 9.0 8.7*    Recent Results (from the past 240 hour(s))  Blood Culture (routine x 2)     Status: None (Preliminary result)   Collection Time: 07/22/17  8:32 AM  Result Value Ref Range Status   Specimen Description BLOOD LEFT FOREARM  Final   Special Requests   Final    BOTTLES DRAWN AEROBIC AND ANAEROBIC Blood Culture adequate volume   Culture   Final    NO GROWTH < 24 HOURS Performed at [facility], [place] [address], [place], [place] [address]    Report Status PENDING  Incomplete  Blood Culture (routine x 2)     Status: None (Preliminary result)   Collection Time: 07/22/17  9:37 AM  Result Value Ref  Range Status   Specimen Description BLOOD RIGHT ARM  Final   Special Requests IN PEDIATRIC BOTTLE Blood Culture adequate volume  Final   Culture   Final    NO GROWTH < 24 HOURS Performed at [facility], [place] [address] [place], [place] [address]    Report Status PENDING  Incomplete     Liver Function Tests: No results for input(s): AST, ALT, ALKPHOS, BILITOT, PROT, ALBUMIN in the last 168 hours. No results for input(s): LIPASE, AMYLASE in the last 168 hours. No results for input(s): AMMONIA in the last 168 hours.  Cardiac Enzymes: Recent Labs  Lab 07/22/17 0644 07/22/17 2048 07/23/17 0242  TROPONINI <0.03 <0.03 <0.03   BNP (last 3 results) Recent Labs    09/17/16 0626 10/22/16 0404 07/22/17 0644  BNP 258.0* 254.1* 342.7*    ProBNP (last 3 results) No results for input(s): PROBNP in the last 8760 hours.    Studies: Dg Chest 2 View  Result Date: 07/22/2017 CLINICAL DATA:  Shortness breath and cough. EXAM: CHEST  2 VIEW COMPARISON:  10/22/2016 FINDINGS: Lungs are adequately inflated without focal airspace consolidation or effusion. There is minimal prominence of the perihilar markings suggesting mild vascular congestion. Stable cardiomegaly. Remainder the exam is unchanged. IMPRESSION: Mild stable cardiomegaly with suggestion mild vascular congestion. Electronically Signed   By: Marin Olp M.D.   On: 07/22/2017 07:52    Scheduled Meds: . aspirin EC  81 mg Oral Daily  . atorvastatin  40 mg Oral q1800  . bisoprolol  2.5 mg Oral Daily  . furosemide  40 mg Oral Daily  . guaiFENesin  600 mg Oral BID  . ipratropium-albuterol  3 mL Nebulization TID  . isosorbide mononitrate  30 mg Oral Daily  . losartan  100 mg Oral Daily  . oseltamivir  30 mg Oral BID  . senna  1 tablet Oral BID  . sodium chloride flush  3 mL Intravenous Q12H  . vitamin B-12  1,000 mcg Oral Daily      Time spent: 25 min  [facility] Pager [phone]. If 7PM-7AM,  please contact night-coverage at [email or web address], Office  [phone]  password [email or web address]  07/23/2017, 5:01 PM  LOS: 1 day

## 2017-07-23 NOTE — Progress Notes (Signed)
  Echocardiogram 2D Echocardiogram has been performed.  Jane [name] L Androw 07/23/2017, 2:22 PM

## 2017-07-23 NOTE — Evaluation (Signed)
Physical Therapy Evaluation Patient Details Name: Jane [name] MRN: [id number] DOB: 14-Feb-1929 Today's Date: 07/23/2017   History of Present Illness  84 year old female who presents with worsening cough as well as dyspnea on exertion. PMH:  CKD, HF, CVA  Clinical Impression  Pt admitted with above diagnosis. Pt currently with functional limitations due to the deficits listed below (see PT Problem List). Pt with weakness, deconditioning and decr balance, will benefit from HHPT at d/c; Pt will benefit from skilled PT to increase their independence and safety with mobility to allow discharge to the venue listed below.  Will follow in acute setting     Follow Up Recommendations Home health PT    Equipment Recommendations  None recommended by PT    Recommendations for Other Services       Precautions / Restrictions Precautions Precautions: Fall      Mobility  Bed Mobility               General bed mobility comments: pt on EOB  Transfers Overall transfer level: Needs assistance Equipment used: Rolling walker (2 wheeled) Transfers: Sit to/from Stand Sit to Stand: Min guard;Min assist         General transfer comment: x2, incr time needed, cues to push from surface  Ambulation/Gait Ambulation/Gait assistance: Min assist;Min guard Ambulation Distance (Feet): 40 Feet(12' more) Assistive device: Rolling walker (2 wheeled) Gait Pattern/deviations: Step-through pattern;Decreased stride length Gait velocity: decr   General Gait Details: cues for posture, RW  position from self  Stairs            Wheelchair Mobility    Modified Rankin (Stroke Patients Only)       Balance Overall balance assessment: Needs assistance Sitting-balance support: Feet supported;No upper extremity supported Sitting balance-Leahy Scale: Good       Standing balance-Leahy Scale: Fair Standing balance comment: requires UE support for dynamic balance                              Pertinent Vitals/Pain Pain Assessment: Faces Faces Pain Scale: Hurts a little bit Pain Location: right knee (chronic) Pain Descriptors / Indicators: Sore    Home Living Family/patient expects to be discharged to:: Private residence Living Arrangements: Alone   Type of Home: Apartment Home Access: Elevator     Home Layout: One level Home Equipment: Environmental consultant - 2 wheels;Tub bench;Grab bars - tub/shower;Grab bars - toilet Additional Comments: pt reports that her nephew gets food for her  "when he can" because she does not drive but she does her own cooking and cleaning; reports she has a son but he is "not well"    Prior Function Level of Independence: Needs assistance   Gait / Transfers Assistance Needed: Furniture walks in apartment, uses RW for community mobility           Hand Dominance        Extremity/Trunk Assessment   Upper Extremity Assessment Upper Extremity Assessment: Generalized weakness    Lower Extremity Assessment Lower Extremity Assessment: Generalized weakness       Communication   Communication: No difficulties  Cognition Arousal/Alertness: Awake/alert Behavior During Therapy: WFL for tasks assessed/performed Overall Cognitive Status: Within Functional Limits for tasks assessed                                        General Comments  Exercises     Assessment/Plan    PT Assessment Patient needs continued PT services  PT Problem List Decreased strength;Decreased activity tolerance;Decreased balance;Decreased knowledge of use of DME;Decreased mobility       PT Treatment Interventions DME instruction;Gait training;Functional mobility training;Balance training;Therapeutic exercise;Patient/family education;Therapeutic activities    PT Goals (Current goals can be found in the Care Plan section)  Acute Rehab PT Goals Patient Stated Goal: feel better PT Goal Formulation: With patient Potential to Achieve Goals:  Good    Frequency Min 3X/week   Barriers to discharge        Co-evaluation               AM-PAC PT "6 Clicks" Daily Activity  Outcome Measure Difficulty turning over in bed (including adjusting bedclothes, sheets and blankets)?: A Little Difficulty moving from lying on back to sitting on the side of the bed? : A Little Difficulty sitting down on and standing up from a chair with arms (e.g., wheelchair, bedside commode, etc,.)?: A Little Help needed moving to and from a bed to chair (including a wheelchair)?: A Little Help needed walking in hospital room?: A Little Help needed climbing 3-5 steps with a railing? : A Little 6 Click Score: 18    End of Session Equipment Utilized During Treatment: Gait belt Activity Tolerance: Patient tolerated treatment well Patient left: in chair;with call bell/phone within reach;with chair alarm set   PT Visit Diagnosis: Difficulty in walking, not elsewhere classified (R26.2)    Time: 9201-0071 PT Time Calculation (min) (ACUTE ONLY): 27 min   Charges:   PT Evaluation $PT Eval Low Complexity: 1 Low PT Treatments $Gait Training: 8-22 mins   PT G Codes:          Jane [name] 08-02-2017, 11:14 AM

## 2017-07-24 LAB — BASIC METABOLIC PANEL
Anion gap: 5 (ref 5–15)
BUN: 32 mg/dL — ABNORMAL HIGH (ref 6–20)
CHLORIDE: 110 mmol/L (ref 101–111)
CO2: 24 mmol/L (ref 22–32)
Calcium: 7.9 mg/dL — ABNORMAL LOW (ref 8.9–10.3)
Creatinine, Ser: 1.29 mg/dL — ABNORMAL HIGH (ref 0.44–1.00)
GFR calc Af Amer: 42 mL/min — ABNORMAL LOW (ref >60)
GFR calc non Af Amer: 36 mL/min — ABNORMAL LOW (ref >60)
Glucose, Bld: 94 mg/dL (ref 65–99)
POTASSIUM: 3.4 mmol/L — AB (ref 3.5–5.1)
Sodium: 139 mmol/L (ref 135–145)

## 2017-07-24 MED ORDER — POTASSIUM CHLORIDE CRYS ER 20 MEQ PO TBCR
40.0000 meq | EXTENDED_RELEASE_TABLET | Freq: Once | ORAL | Status: AC
  Administered 2017-07-24: 40 meq via ORAL
  Filled 2017-07-24: qty 2

## 2017-07-24 NOTE — Progress Notes (Signed)
CSW consulted to follow as pt lives in facility- lives in independent living community and no indication currently that she will need higher level of care (HHPT recommended). Will sign off, please re-consult if CSW needs arise.  Sharren Bridge, MSW, LCSW Clinical Social Work 07/24/2017 [phone]

## 2017-07-24 NOTE — Progress Notes (Signed)
[facility]  PROGRESS NOTE  Jane [name] YIR:[id number] DOB: Nov 07, 1928 DOA: 07/22/2017 PCP: Colbert Ewing, MD   Brief HPI:    84 y.o. female with past medical history relevant for CAD, preserved EF CHF (diastolic dysfunction with last known EF of 60%), and hypertension who presents  via ems with complaints of shortness of breath, cough, which is mostly dry.  Patient states when she got up to go to the bathroom she became very short of breath and had a difficult time getting back.  No leg pain, no leg swelling, no pleuritic symptoms      Subjective   Patient seen and examined, denies chest pain or shortness of breath.   Assessment/Plan:     1. Influenza A- patient started on Tamiflu 30 mg twice a day. Continue bronchodilators PRN 2. Chronic diastolic CHF -patient last EF around 60%, troponin negative, BNP 342, continue Lasix 40 mg PO daily. 3. Hypokalemia-potassium is 3.4 today, will replace potassium and check BMP in am. 4. CAD/hypertension-stable, no ACS symptoms. Troponin negative. Continue aspirin 81 mg PO daily, Lipitor, metoprolol, isosorbide, losartan.    DVT prophylaxis: SCD's  Code Status: Full code  Family Communication: No family at bedside  Disposition Plan: Home in 1-2 days   Consultants:  None   Procedures:  None   Continuous infusions . sodium chloride        Antibiotics:   Anti-infectives (From admission, onward)   Start     Dose/Rate Route Frequency Ordered Stop   07/22/17 2200  oseltamivir (TAMIFLU) capsule 30 mg     30 mg Oral 2 times daily 07/22/17 1733 07/27/17 2159       Objective   Vitals:   07/24/17 0609 07/24/17 0827 07/24/17 1030 07/24/17 1308  BP: 139/64 (!) 105/40  (!) 116/44  Pulse: 86 81  70  Resp: 17 17  18   Temp: 98.9 F (37.2 C) 98.2 F (36.8 C)  98.1 F (36.7 C)  TempSrc: Oral Oral  Oral  SpO2: 95% 98% 95% 95%  Weight: 70 kg (154 lb 5.2 oz)     Height:        Intake/Output Summary (Last 24  hours) at 07/24/2017 1358 Last data filed at 07/24/2017 0828 Gross per 24 hour  Intake 600 ml  Output 500 ml  Net 100 ml   Filed Weights   07/22/17 0614 07/23/17 0354 07/24/17 0609  Weight: 73.9 kg (163 lb) 69.5 kg (153 lb 3.5 oz) 70 kg (154 lb 5.2 oz)     Physical Examination:  Physical Exam: Eyes: No icterus, extraocular muscles intact  Mouth: Oral mucosa is moist, no lesions on palate,  Neck: Supple, no deformities, masses, or tenderness Lungs: Normal respiratory effort, bilateral clear to auscultation, no crackles or wheezes.  Heart: Regular rate and rhythm, S1 and S2 normal, no murmurs, rubs auscultated Abdomen: BS normoactive,soft,nondistended,non-tender to palpation,no organomegaly Extremities: No pretibial edema, no erythema, no cyanosis, no clubbing Neuro : Alert and oriented to time, place and person, No focal deficits  Skin: No rashes seen on exam     Data Reviewed: I have personally reviewed following labs and imaging studies  CBG: No results for input(s): GLUCAP in the last 168 hours.  CBC: Recent Labs  Lab 07/22/17 0644 07/23/17 0242  WBC 3.1* 2.3*  NEUTROABS 2.6  --   HGB 10.3* 9.7*  HCT 30.9* 28.7*  MCV 94.8 95.7  PLT 91* 83*    Basic Metabolic Panel: Recent Labs  Lab 07/22/17 0644 07/23/17  0242 07/24/17 0357  NA 141 138 139  K 3.4* 3.0* 3.4*  CL 112* 106 110  CO2 23 24 24   GLUCOSE 104* 107* 94  BUN 20 24* 32*  CREATININE 0.97 1.23* 1.29*  CALCIUM 9.0 8.7* 7.9*    Recent Results (from the past 240 hour(s))  Blood Culture (routine x 2)     Status: None (Preliminary result)   Collection Time: 07/22/17  8:32 AM  Result Value Ref Range Status   Specimen Description BLOOD LEFT FOREARM  Final   Special Requests   Final    BOTTLES DRAWN AEROBIC AND ANAEROBIC Blood Culture adequate volume   Culture   Final    NO GROWTH 2 DAYS Performed at [facility], [address] [place], [place] [address]    Report Status PENDING  Incomplete   Blood Culture (routine x 2)     Status: None (Preliminary result)   Collection Time: 07/22/17  9:37 AM  Result Value Ref Range Status   Specimen Description BLOOD RIGHT ARM  Final   Special Requests IN PEDIATRIC BOTTLE Blood Culture adequate volume  Final   Culture   Final    NO GROWTH 2 DAYS Performed at [facility], [place] [address] [place], [place] [address]    Report Status PENDING  Incomplete     Liver Function Tests: No results for input(s): AST, ALT, ALKPHOS, BILITOT, PROT, ALBUMIN in the last 168 hours. No results for input(s): LIPASE, AMYLASE in the last 168 hours. No results for input(s): AMMONIA in the last 168 hours.  Cardiac Enzymes: Recent Labs  Lab 07/22/17 0644 07/22/17 2048 07/23/17 0242  TROPONINI <0.03 <0.03 <0.03   BNP (last 3 results) Recent Labs    09/17/16 0626 10/22/16 0404 07/22/17 0644  BNP 258.0* 254.1* 342.7*    ProBNP (last 3 results) No results for input(s): PROBNP in the last 8760 hours.    Studies: No results found.  Scheduled Meds: . aspirin EC  81 mg Oral Daily  . atorvastatin  40 mg Oral q1800  . bisoprolol  2.5 mg Oral Daily  . guaiFENesin  600 mg Oral BID  . ipratropium-albuterol  3 mL Nebulization BID  . isosorbide mononitrate  30 mg Oral Daily  . losartan  100 mg Oral Daily  . oseltamivir  30 mg Oral BID  . senna  1 tablet Oral BID  . sodium chloride flush  3 mL Intravenous Q12H  . vitamin B-12  1,000 mcg Oral Daily      Time spent: 25 min  [facility] Pager [phone]. If 7PM-7AM, please contact night-coverage at [email or web address], Office  [phone]  password [email or web address]  07/24/2017, 1:58 PM  LOS: 2 days

## 2017-07-25 DIAGNOSIS — I5020 Unspecified systolic (congestive) heart failure: Secondary | ICD-10-CM

## 2017-07-25 LAB — BASIC METABOLIC PANEL
Anion gap: 8 (ref 5–15)
BUN: 39 mg/dL — AB (ref 6–20)
CALCIUM: 8.1 mg/dL — AB (ref 8.9–10.3)
CO2: 22 mmol/L (ref 22–32)
CREATININE: 1.48 mg/dL — AB (ref 0.44–1.00)
Chloride: 107 mmol/L (ref 101–111)
GFR calc Af Amer: 36 mL/min — ABNORMAL LOW (ref >60)
GFR, EST NON AFRICAN AMERICAN: 31 mL/min — AB (ref >60)
GLUCOSE: 95 mg/dL (ref 65–99)
Potassium: 4.4 mmol/L (ref 3.5–5.1)
Sodium: 137 mmol/L (ref 135–145)

## 2017-07-25 MED ORDER — OSELTAMIVIR PHOSPHATE 30 MG PO CAPS
30.0000 mg | ORAL_CAPSULE | Freq: Every day | ORAL | Status: AC
  Administered 2017-07-25 – 2017-07-26 (×2): 30 mg via ORAL
  Filled 2017-07-25 (×2): qty 1

## 2017-07-25 MED ORDER — SODIUM CHLORIDE 0.9 % IV SOLN
INTRAVENOUS | Status: DC
  Administered 2017-07-25: 18:00:00 via INTRAVENOUS

## 2017-07-25 NOTE — NC FL2 (Signed)
[place] LEVEL OF CARE SCREENING TOOL     IDENTIFICATION  Patient Name: Jane [name] Birthdate: 04-08-1929 Sex: female Admission Date (Current Location): 07/22/2017  [facility] [place] Number:  Herbalist and Address:  [facility],  [place] [address], [place]      Provider Number: [id number]  Attending Physician Name and Address:  Oswald Hillock, MD  Relative Name and Phone Number:       Current Level of Care: Hospital Recommended Level of Care: [facility] [place] Prior Approval Number:    Date Approved/Denied:   PASRR Number: [id number]  Discharge Plan: SNF    Current Diagnoses: Patient Active Problem List   Diagnosis Date Noted  . CHF (congestive heart failure) ([place]) 07/22/2017  . Influenza A 07/22/2017  . Need for immunization against influenza 03/24/2017  . Unsteady gait 08/11/2016  . Abdominal aortic aneurysm ([place]) 05/21/2016  . Trochanteric bursitis of left hip   . CKD (chronic kidney disease) stage 3, GFR 30-59 ml/min (H[facility]) 03/31/2015  . Prediabetes 10/16/2014  . Status post insertion of drug-eluting stent into right coronary artery for coronary artery disease 08/07/2014  . Dyspnea 05/31/2014  . Physical deconditioning 05/31/2014  . Heart failure with preserved ejection fraction ([place]) 12/27/2012  . Ectropion of left lower eyelid 12/17/2012  . Onychomycosis of toenail 09/18/2012  . PAD (peripheral artery disease) ([place]) 09/18/2012  . Preventative health care 09/18/2012  . Vitamin D deficiency 04/25/2008  . C V A / STROKE 10/29/2007  . Hyperlipidemia 09/24/2006  . Normocytic anemia 09/24/2006  . Essential hypertension 09/24/2006  . OSTEOPENIA 09/24/2006  . Vitamin B12 deficiency 08/29/2006    Orientation RESPIRATION BLADDER Height & Weight     Self, Time, Situation, Place  Normal Incontinent Weight: 155 lb 10.3 oz (70.6 kg) Height:  5\' 4"  (162.6 cm)  BEHAVIORAL SYMPTOMS/MOOD NEUROLOGICAL BOWEL  NUTRITION STATUS      Continent Diet(heart healthy)  AMBULATORY STATUS COMMUNICATION OF NEEDS Skin   Limited Assist Verbally Normal                       Personal Care Assistance Level of Assistance  Bathing, Feeding, Dressing Bathing Assistance: Limited assistance Feeding assistance: Independent Dressing Assistance: Limited assistance     Functional Limitations Info  Sight, Hearing, Speech Sight Info: Adequate Hearing Info: Adequate Speech Info: Adequate    SPECIAL CARE FACTORS FREQUENCY  PT (By licensed PT), OT (By licensed OT)     PT Frequency: 5x OT Frequency: 5x            Contractures Contractures Info: Not present    Additional Factors Info  Code Status, Allergies Code Status Info: full code Allergies Info: nka           Current Medications (07/25/2017):  This is the current hospital active medication list Current Facility-Administered Medications  Medication Dose Route Frequency Provider Last Rate Last Dose  . 0.9 %  sodium chloride infusion  250 mL Intravenous PRN Emokpae, Courage, MD      . 0.9 %  sodium chloride infusion   Intravenous Continuous Oswald Hillock, MD 75 mL/hr at 07/25/17 1033    . acetaminophen (TYLENOL) tablet 650 mg  650 mg Oral Q6H PRN Denton Brick, Courage, MD   650 mg at 07/23/17 1829   Or  . acetaminophen (TYLENOL) suppository 650 mg  650 mg Rectal Q6H PRN Emokpae, Courage, MD      . albuterol (PROVENTIL) (2.5  MG/3ML) 0.083% nebulizer solution 2.5 mg  2.5 mg Nebulization Q2H PRN Emokpae, Courage, MD      . aspirin EC tablet 81 mg  81 mg Oral Daily Emokpae, Courage, MD   81 mg at 07/25/17 1032  . atorvastatin (LIPITOR) tablet 40 mg  40 mg Oral q1800 Emokpae, Courage, MD   40 mg at 07/24/17 1712  . bisoprolol (ZEBETA) tablet 2.5 mg  2.5 mg Oral Daily Roxan Hockey, MD   Stopped at 07/25/17 1031  . guaiFENesin (MUCINEX) 12 hr tablet 600 mg  600 mg Oral BID Denton Brick, Courage, MD   600 mg at 07/25/17 1032  . hydrALAZINE (APRESOLINE)  injection 10 mg  10 mg Intravenous Q6H PRN Emokpae, Courage, MD      . ipratropium-albuterol (DUONEB) 0.5-2.5 (3) MG/3ML nebulizer solution 3 mL  3 mL Nebulization BID Oswald Hillock, MD   3 mL at 07/25/17 1056  . isosorbide mononitrate (IMDUR) 24 hr tablet 30 mg  30 mg Oral Daily Emokpae, Courage, MD   30 mg at 07/25/17 1032  . losartan (COZAAR) tablet 100 mg  100 mg Oral Daily Roxan Hockey, MD   Stopped at 07/25/17 1031  . ondansetron (ZOFRAN) tablet 4 mg  4 mg Oral Q6H PRN Emokpae, Courage, MD       Or  . ondansetron (ZOFRAN) injection 4 mg  4 mg Intravenous Q6H PRN Emokpae, Courage, MD      . oseltamivir (TAMIFLU) capsule 30 mg  30 mg Oral Daily Leodis Sias T, RPH   30 mg at 07/25/17 1031  . oxyCODONE (Oxy IR/ROXICODONE) immediate release tablet 5 mg  5 mg Oral Q6H PRN Oswald Hillock, MD   5 mg at 07/24/17 2057  . polyethylene glycol (MIRALAX / GLYCOLAX) packet 17 g  17 g Oral Daily PRN Emokpae, Courage, MD      . polyvinyl alcohol (LIQUIFILM TEARS) 1.4 % ophthalmic solution 1-2 drop  1-2 drop Both Eyes PRN Emokpae, Courage, MD      . senna (SENOKOT) tablet 8.6 mg  1 tablet Oral BID Emokpae, Courage, MD   8.6 mg at 07/25/17 1031  . sodium chloride flush (NS) 0.9 % injection 3 mL  3 mL Intravenous Q12H Emokpae, Courage, MD   3 mL at 07/25/17 1033  . sodium chloride flush (NS) 0.9 % injection 3 mL  3 mL Intravenous PRN Emokpae, Courage, MD      . traZODone (DESYREL) tablet 50 mg  50 mg Oral QHS PRN Roxan Hockey, MD   50 mg at 07/23/17 2112  . vitamin B-12 (CYANOCOBALAMIN) tablet 1,000 mcg  1,000 mcg Oral Daily Roxan Hockey, MD   1,000 mcg at 07/25/17 1032     Discharge Medications: Please see discharge summary for a list of discharge medications.  Relevant Imaging Results:  Relevant Lab Results:   Additional Information SS# [id number]  Nila Nephew, LCSW

## 2017-07-25 NOTE — Care Management Important Message (Addendum)
Important Message  Patient Details IM Letter given to Cookie/Case Manager to present to the Patient Name: DASHAY GIESLER MRN: [id number] Date of Birth: 03-07-1929   Medicare Important Message Given:  Yes    Kerin Salen 07/25/2017, 10:24 AMImportant Message  Patient Details  Name: STEPHANNE GREELEY MRN: [id number] Date of Birth: 1928/07/27   Medicare Important Message Given:  Yes    Kerin Salen 07/25/2017, 10:24 AM

## 2017-07-25 NOTE — Progress Notes (Signed)
PHARMACY NOTE -  ANTIBIOTIC RENAL DOSE ADJUSTMENT   Patient has been initiated on tamiflu for influenza. SCr 1.48, estimated CrCl 26 ml/min  Plan:  Reduced tamiflu to 30 qday x 2 days to complete course Eudelia Bunch, Pharm.D. [phone] 07/25/2017 7:43 AM

## 2017-07-25 NOTE — Progress Notes (Signed)
Met with pt to discuss change in recommendation from Home Health PT to SNF. Pt very hesitant to pursue SNF. Main barrier is feeling that "she will do just as well at home." Lives in [facility] and states she has good support there from aides and her family checking in. Pt has been to [facility] for rehab in the past and states she would like to go there again if she decided she does want SNF (brother is long term care resident there). CSW offered to discuss with family, however pt states she prefers to talk with them and get back with CSW re: her decision. States she "may not be able to talk to them until tonight."  CSW explained that as time to pursue SNF if often limited, referral need to be made in order to have options for pt. She agreed but states at this point she feels she will decide to go home rather than SNF.  Plan: TBD- SNF versus home (independent living) with home health   , MSW, LCSW Clinical Social Work 07/25/2017 [phone]   

## 2017-07-25 NOTE — Progress Notes (Signed)
Physical Therapy Treatment Patient Details Name: Jane [name] MRN: [id number] DOB: 08-15-1928 Today's Date: 07/25/2017    History of Present Illness 84 year old female who presents with worsening cough as well as dyspnea on exertion. PMH:  CKD, HF, CVA    PT Comments    Pt  With  decr amb distance today compared to eval, fatigues rapidly; she is requiring min assist for balance during amb with RW  X 30'; updated plan to reflect that pt may need SNF prior to returning home alone; she has very little home support  Follow Up Recommendations  SNF     Equipment Recommendations  None recommended by PT    Recommendations for Other Services       Precautions / Restrictions Precautions Precautions: Fall Restrictions Weight Bearing Restrictions: No    Mobility  Bed Mobility Overal bed mobility: Needs Assistance Bed Mobility: Supine to Sit     Supine to sit: Supervision;Min guard     General bed mobility comments: incr time, effortful transition, HOB elevated 40*  Transfers Overall transfer level: Needs assistance Equipment used: Rolling walker (2 wheeled) Transfers: Sit to/from Stand Sit to Stand: Min assist         General transfer comment: x2, incr time needed, cues to push from surface and control descent; 2 trials d/t pt with fatigue and had to immediately sit back onto bed  Ambulation/Gait Ambulation/Gait assistance: Min assist Ambulation Distance (Feet): 30 Feet Assistive device: Rolling walker (2 wheeled) Gait Pattern/deviations: Step-through pattern;Decreased stride length Gait velocity: decr   General Gait Details: cues for posture, RW  position from self; requires min assist throughout for balance, fatigues rapidly   Stairs            Wheelchair Mobility    Modified Rankin (Stroke Patients Only)       Balance Overall balance assessment: Needs assistance Sitting-balance support: Feet supported;No upper extremity supported Sitting  balance-Leahy Scale: Good     Standing balance support: During functional activity;Bilateral upper extremity supported Standing balance-Leahy Scale: Poor Standing balance comment: reliant on UE support for static balance today                            Cognition Arousal/Alertness: Awake/alert Behavior During Therapy: WFL for tasks assessed/performed Overall Cognitive Status: Within Functional Limits for tasks assessed                                        Exercises      General Comments        Pertinent Vitals/Pain Pain Assessment: No/denies pain Faces Pain Scale: (later c/o back pain with mobility)    Home Living                      Prior Function            PT Goals (current goals can now be found in the care plan section) Acute Rehab PT Goals Patient Stated Goal: feel better PT Goal Formulation: With patient Potential to Achieve Goals: Good Progress towards PT goals: Progressing toward goals(slowly)    Frequency    Min 3X/week      PT Plan Discharge plan needs to be updated    Co-evaluation              AM-PAC PT "6 Clicks" Daily Activity  Outcome  Measure  Difficulty turning over in bed (including adjusting bedclothes, sheets and blankets)?: A Little Difficulty moving from lying on back to sitting on the side of the bed? : A Little Difficulty sitting down on and standing up from a chair with arms (e.g., wheelchair, bedside commode, etc,.)?: A Little Help needed moving to and from a bed to chair (including a wheelchair)?: A Little Help needed walking in hospital room?: A Lot Help needed climbing 3-5 steps with a railing? : A Lot 6 Click Score: 16    End of Session Equipment Utilized During Treatment: Gait belt Activity Tolerance: Patient limited by fatigue Patient left: in chair;with call bell/phone within reach;with chair alarm set   PT Visit Diagnosis: Difficulty in walking, not elsewhere classified  (R26.2)     Time: 4827-0786 PT Time Calculation (min) (ACUTE ONLY): 22 min  Charges:  $Gait Training: 8-22 mins                    G CodesKenyon Ana, PT Pager: [phone] 07/25/2017    Kenyon Ana 07/25/2017, 11:51 AM

## 2017-07-25 NOTE — Progress Notes (Signed)
[facility]  PROGRESS NOTE  Jane [name]:[id number] DOB: 1929/01/18 DOA: 07/22/2017 PCP: Colbert Ewing, MD   Brief HPI:    84 y.o. female with past medical history relevant for CAD, preserved EF CHF (diastolic dysfunction with last known EF of 60%), and hypertension who presents  via ems with complaints of shortness of breath, cough, which is mostly dry.  Patient states when she got up to go to the bathroom she became very short of breath and had a difficult time getting back.  No leg pain, no leg swelling, no pleuritic symptoms      Subjective   Patient seen and examined, denies chest pain or shortness of breath.   Assessment/Plan:     1. Influenza A- patient started on Tamiflu 30 mg twice a day. Continue bronchodilators PRN 2. Chronic diastolic CHF -patient last EF around 60%, troponin negative, BNP 342, Lasix currently hold due to worsening renal function. 3. Acute kidney injury-creatinine is 1.48, worse from 1.29 yesterday. Diuretics currently hold. Start gentle IV hydration with normal saline. Check BMP in am.  4. Hypokalemia-replete. 5. CAD/hypertension-stable, no ACS symptoms. Troponin negative. Continue aspirin 81 mg PO daily, Lipitor, metoprolol, isosorbide, losartan.    DVT prophylaxis: SCD's  Code Status: Full code  Family Communication: No family at bedside  Disposition Plan: snf   Consultants:  None   Procedures:  None   Continuous infusions . sodium chloride    . sodium chloride 75 mL/hr at 07/25/17 1033      Antibiotics:   Anti-infectives (From admission, onward)   Start     Dose/Rate Route Frequency Ordered Stop   07/25/17 1000  oseltamivir (TAMIFLU) capsule 30 mg     30 mg Oral Daily 07/25/17 0741 07/27/17 0959   07/22/17 2200  oseltamivir (TAMIFLU) capsule 30 mg  Status:  Discontinued     30 mg Oral 2 times daily 07/22/17 1733 07/25/17 0741       Objective   Vitals:   07/24/17 2057 07/25/17 0613 07/25/17 1033  07/25/17 1056  BP: (!) 123/48 (!) 112/48 (!) 115/46   Pulse: 72 66 67   Resp: 17 16    Temp: 98.7 F (37.1 C) 98 F (36.7 C)    TempSrc: Oral Oral    SpO2: 94% 96%  98%  Weight:  70.6 kg (155 lb 10.3 oz)    Height:        Intake/Output Summary (Last 24 hours) at 07/25/2017 1342 Last data filed at 07/25/2017 0600 Gross per 24 hour  Intake 120 ml  Output -  Net 120 ml   Filed Weights   07/23/17 0354 07/24/17 0609 07/25/17 0613  Weight: 69.5 kg (153 lb 3.5 oz) 70 kg (154 lb 5.2 oz) 70.6 kg (155 lb 10.3 oz)     Physical Examination:  Physical Exam: Eyes: No icterus, extraocular muscles intact  Mouth: Oral mucosa is moist, no lesions on palate,  Neck: Supple, no deformities, masses, or tenderness Lungs: Normal respiratory effort, bilateral clear to auscultation, no crackles or wheezes.  Heart: Regular rate and rhythm, S1 and S2 normal, no murmurs, rubs auscultated Abdomen: BS normoactive,soft,nondistended,non-tender to palpation,no organomegaly Extremities: No pretibial edema, no erythema, no cyanosis, no clubbing Neuro : Alert and oriented to time, place and person, No focal deficits Skin: No rashes seen on exam      Data Reviewed: I have personally reviewed following labs and imaging studies  CBG: No results for input(s): GLUCAP in the last 168 hours.  CBC:  Recent Labs  Lab 07/22/17 0644 07/23/17 0242  WBC 3.1* 2.3*  NEUTROABS 2.6  --   HGB 10.3* 9.7*  HCT 30.9* 28.7*  MCV 94.8 95.7  PLT 91* 83*    Basic Metabolic Panel: Recent Labs  Lab 07/22/17 0644 07/23/17 0242 07/24/17 0357 07/25/17 0404  NA 141 138 139 137  K 3.4* 3.0* 3.4* 4.4  CL 112* 106 110 107  CO2 23 24 24 22   GLUCOSE 104* 107* 94 95  BUN 20 24* 32* 39*  CREATININE 0.97 1.23* 1.29* 1.48*  CALCIUM 9.0 8.7* 7.9* 8.1*    Recent Results (from the past 240 hour(s))  Blood Culture (routine x 2)     Status: None (Preliminary result)   Collection Time: 07/22/17  8:32 AM  Result Value Ref  Range Status   Specimen Description BLOOD LEFT FOREARM  Final   Special Requests   Final    BOTTLES DRAWN AEROBIC AND ANAEROBIC Blood Culture adequate volume   Culture   Final    NO GROWTH 3 DAYS Performed at [facility], [address], [place], [place] [address]    Report Status PENDING  Incomplete  Blood Culture (routine x 2)     Status: None (Preliminary result)   Collection Time: 07/22/17  9:37 AM  Result Value Ref Range Status   Specimen Description BLOOD RIGHT ARM  Final   Special Requests IN PEDIATRIC BOTTLE Blood Culture adequate volume  Final   Culture   Final    NO GROWTH 3 DAYS Performed at [facility], [place] [address] [place], [place] [address]    Report Status PENDING  Incomplete     Liver Function Tests: No results for input(s): AST, ALT, ALKPHOS, BILITOT, PROT, ALBUMIN in the last 168 hours. No results for input(s): LIPASE, AMYLASE in the last 168 hours. No results for input(s): AMMONIA in the last 168 hours.  Cardiac Enzymes: Recent Labs  Lab 07/22/17 0644 07/22/17 2048 07/23/17 0242  TROPONINI <0.03 <0.03 <0.03   BNP (last 3 results) Recent Labs    09/17/16 0626 10/22/16 0404 07/22/17 0644  BNP 258.0* 254.1* 342.7*    ProBNP (last 3 results) No results for input(s): PROBNP in the last 8760 hours.    Studies: No results found.  Scheduled Meds: . aspirin EC  81 mg Oral Daily  . atorvastatin  40 mg Oral q1800  . bisoprolol  2.5 mg Oral Daily  . guaiFENesin  600 mg Oral BID  . ipratropium-albuterol  3 mL Nebulization BID  . isosorbide mononitrate  30 mg Oral Daily  . losartan  100 mg Oral Daily  . oseltamivir  30 mg Oral Daily  . senna  1 tablet Oral BID  . sodium chloride flush  3 mL Intravenous Q12H  . vitamin B-12  1,000 mcg Oral Daily      Time spent: 25 min  [facility] Pager [phone]. If 7PM-7AM, please contact night-coverage at [email or web address], Office  [phone]  password  [email or web address]  07/25/2017, 1:42 PM  LOS: 3 days

## 2017-07-26 LAB — BASIC METABOLIC PANEL
Anion gap: 7 (ref 5–15)
BUN: 41 mg/dL — ABNORMAL HIGH (ref 6–20)
CHLORIDE: 112 mmol/L — AB (ref 101–111)
CO2: 20 mmol/L — ABNORMAL LOW (ref 22–32)
CREATININE: 1.34 mg/dL — AB (ref 0.44–1.00)
Calcium: 8 mg/dL — ABNORMAL LOW (ref 8.9–10.3)
GFR calc Af Amer: 40 mL/min — ABNORMAL LOW (ref >60)
GFR calc non Af Amer: 35 mL/min — ABNORMAL LOW (ref >60)
GLUCOSE: 86 mg/dL (ref 65–99)
Potassium: 4.4 mmol/L (ref 3.5–5.1)
Sodium: 139 mmol/L (ref 135–145)

## 2017-07-26 MED ORDER — ATORVASTATIN CALCIUM 40 MG PO TABS
40.0000 mg | ORAL_TABLET | Freq: Every day | ORAL | Status: DC
  Administered 2017-07-26 – 2017-07-27 (×2): 40 mg via ORAL
  Filled 2017-07-26 (×2): qty 1

## 2017-07-26 MED ORDER — SODIUM CHLORIDE 0.9 % IV SOLN
INTRAVENOUS | Status: DC
  Administered 2017-07-26 – 2017-07-27 (×4): via INTRAVENOUS

## 2017-07-26 MED ORDER — FUROSEMIDE 40 MG PO TABS
40.0000 mg | ORAL_TABLET | Freq: Every day | ORAL | Status: DC
  Administered 2017-07-26: 40 mg via ORAL
  Filled 2017-07-26: qty 1

## 2017-07-26 NOTE — Plan of Care (Signed)
  Progressing Education: Knowledge of General Education information will improve 07/26/2017 2211 - Progressing by Talbert Forest, RN

## 2017-07-26 NOTE — Progress Notes (Addendum)
[facility]  PROGRESS NOTE  Jane [name] YOV:[id number] DOB: 09-Sep-1928 DOA: 07/22/2017 PCP: Colbert Ewing, MD   Brief HPI:    84 y.o. female with past medical history relevant for CAD, preserved EF CHF (diastolic dysfunction with last known EF of 60%), and hypertension who presents  via ems with complaints of shortness of breath, cough, which is mostly dry.  Patient states when she got up to go to the bathroom she became very short of breath and had a difficult time getting back.  No leg pain, no leg swelling, no pleuritic symptoms    Subjective   Patient seen and examined, denies chest pain   Assessment/Plan:     1. Influenza A- patient started on Tamiflu 30 mg twice a day. Continue bronchodilators PRN 2. Chronic diastolic CHF -patient last EF around 60%, troponin negative, BNP 342,  Lasix on hold due to worsening renal funtion 3. Acute kidney injury-improved,-creatinine is 1.34, will change  IV fluids to 100 ml/hr. 4. Hypokalemia-replete. 5. CAD/hypertension-stable, no ACS symptoms. Troponin negative. Continue aspirin 81 mg PO daily, Lipitor, metoprolol, isosorbide, losartan.    DVT prophylaxis: SCD's  Code Status: Full code  Family Communication: No family at bedside  Disposition Plan: snf   Consultants:  None   Procedures:  None   Continuous infusions . sodium chloride        Antibiotics:   Anti-infectives (From admission, onward)   Start     Dose/Rate Route Frequency Ordered Stop   07/25/17 1000  oseltamivir (TAMIFLU) capsule 30 mg     30 mg Oral Daily 07/25/17 0741 07/26/17 1037   07/22/17 2200  oseltamivir (TAMIFLU) capsule 30 mg  Status:  Discontinued     30 mg Oral 2 times daily 07/22/17 1733 07/25/17 0741       Objective   Vitals:   07/25/17 2144 07/26/17 0451 07/26/17 0900 07/26/17 1054  BP:  (!) 118/46  (!) 131/47  Pulse:  66  69  Resp:  18    Temp:  98.7 F (37.1 C)    TempSrc:  Oral    SpO2: 97% 96% 95%   Weight:   71.3 kg (157 lb 3 oz)    Height:        Intake/Output Summary (Last 24 hours) at 07/26/2017 1348 Last data filed at 07/26/2017 1001 Gross per 24 hour  Intake 1703.75 ml  Output 1200 ml  Net 503.75 ml   Filed Weights   07/24/17 0609 07/25/17 0613 07/26/17 0451  Weight: 70 kg (154 lb 5.2 oz) 70.6 kg (155 lb 10.3 oz) 71.3 kg (157 lb 3 oz)     Physical Examination:  Physical Exam: Eyes: No icterus, extraocular muscles intact  Mouth: Oral mucosa is moist, no lesions on palate,  Neck: Supple, no deformities, masses, or tenderness Lungs: Normal respiratory effort, bilateral clear to auscultation, no crackles or wheezes.  Heart: Regular rate and rhythm, S1 and S2 normal, no murmurs, rubs auscultated Abdomen: BS normoactive,soft,nondistended,non-tender to palpation,no organomegaly Extremities: No pretibial edema, no erythema, no cyanosis, no clubbing Neuro : Alert and oriented to time, place and person, No focal deficits Skin: No rashes seen on exam       Data Reviewed: I have personally reviewed following labs and imaging studies  CBG: No results for input(s): GLUCAP in the last 168 hours.  CBC: Recent Labs  Lab 07/22/17 0644 07/23/17 0242  WBC 3.1* 2.3*  NEUTROABS 2.6  --   HGB 10.3* 9.7*  HCT 30.9* 28.7*  MCV 94.8 95.7  PLT 91* 83*    Basic Metabolic Panel: Recent Labs  Lab 07/22/17 0644 07/23/17 0242 07/24/17 0357 07/25/17 0404 07/26/17 0427  NA 141 138 139 137 139  K 3.4* 3.0* 3.4* 4.4 4.4  CL 112* 106 110 107 112*  CO2 23 24 24 22  20*  GLUCOSE 104* 107* 94 95 86  BUN 20 24* 32* 39* 41*  CREATININE 0.97 1.23* 1.29* 1.48* 1.34*  CALCIUM 9.0 8.7* 7.9* 8.1* 8.0*    Recent Results (from the past 240 hour(s))  Blood Culture (routine x 2)     Status: None (Preliminary result)   Collection Time: 07/22/17  8:32 AM  Result Value Ref Range Status   Specimen Description BLOOD LEFT FOREARM  Final   Special Requests   Final    BOTTLES DRAWN AEROBIC AND  ANAEROBIC Blood Culture adequate volume   Culture   Final    NO GROWTH 3 DAYS Performed at [facility], [address] [place], [place] [address]    Report Status PENDING  Incomplete  Blood Culture (routine x 2)     Status: None (Preliminary result)   Collection Time: 07/22/17  9:37 AM  Result Value Ref Range Status   Specimen Description BLOOD RIGHT ARM  Final   Special Requests IN PEDIATRIC BOTTLE Blood Culture adequate volume  Final   Culture   Final    NO GROWTH 3 DAYS Performed at [facility], [place] [address] [place], [place] [address]    Report Status PENDING  Incomplete     Liver Function Tests: No results for input(s): AST, ALT, ALKPHOS, BILITOT, PROT, ALBUMIN in the last 168 hours. No results for input(s): LIPASE, AMYLASE in the last 168 hours. No results for input(s): AMMONIA in the last 168 hours.  Cardiac Enzymes: Recent Labs  Lab 07/22/17 0644 07/22/17 2048 07/23/17 0242  TROPONINI <0.03 <0.03 <0.03   BNP (last 3 results) Recent Labs    09/17/16 0626 10/22/16 0404 07/22/17 0644  BNP 258.0* 254.1* 342.7*    ProBNP (last 3 results) No results for input(s): PROBNP in the last 8760 hours.    Studies: No results found.  Scheduled Meds: . aspirin EC  81 mg Oral Daily  . atorvastatin  40 mg Oral q1800  . bisoprolol  2.5 mg Oral Daily  . furosemide  40 mg Oral Daily  . guaiFENesin  600 mg Oral BID  . ipratropium-albuterol  3 mL Nebulization BID  . isosorbide mononitrate  30 mg Oral Daily  . losartan  100 mg Oral Daily  . senna  1 tablet Oral BID  . sodium chloride flush  3 mL Intravenous Q12H  . vitamin B-12  1,000 mcg Oral Daily      Time spent: 25 min  [facility] Pager [phone]. If 7PM-7AM, please contact night-coverage at [email or web address], Office  [phone]  password [email or web address]  07/26/2017, 1:48 PM  LOS: 4 days

## 2017-07-27 LAB — BASIC METABOLIC PANEL
Anion gap: 5 (ref 5–15)
BUN: 38 mg/dL — AB (ref 6–20)
CO2: 20 mmol/L — ABNORMAL LOW (ref 22–32)
Calcium: 8.1 mg/dL — ABNORMAL LOW (ref 8.9–10.3)
Chloride: 113 mmol/L — ABNORMAL HIGH (ref 101–111)
Creatinine, Ser: 1.13 mg/dL — ABNORMAL HIGH (ref 0.44–1.00)
GFR calc Af Amer: 50 mL/min — ABNORMAL LOW (ref >60)
GFR, EST NON AFRICAN AMERICAN: 43 mL/min — AB (ref >60)
Glucose, Bld: 85 mg/dL (ref 65–99)
Potassium: 4.4 mmol/L (ref 3.5–5.1)
SODIUM: 138 mmol/L (ref 135–145)

## 2017-07-27 LAB — CULTURE, BLOOD (ROUTINE X 2)
Culture: NO GROWTH
Culture: NO GROWTH
Special Requests: ADEQUATE
Special Requests: ADEQUATE

## 2017-07-27 NOTE — Progress Notes (Signed)
[facility]  PROGRESS NOTE  Jane [name] KYH:[id number] DOB: 02-Jan-1929 DOA: 07/22/2017 PCP: Colbert Ewing, MD   Brief HPI:    84 y.o. female with past medical history relevant for CAD, preserved EF CHF (diastolic dysfunction with last known EF of 60%), and hypertension who presents  via ems with complaints of shortness of breath, cough, which is mostly dry.  Patient states when she got up to go to the bathroom she became very short of breath and had a difficult time getting back.  No leg pain, no leg swelling, no pleuritic symptoms    Subjective   Patient seen and examined, denies shortness of breath.   Assessment/Plan:     1. Influenza A- patient started on Tamiflu 30 mg twice a day. Continue bronchodilators PRN 2. Chronic diastolic CHF -patient last EF around 60%, troponin negative, BNP 342, Lasix currently hold due to worsening renal function. 3. Acute kidney injury-creatinine is 1.13, improved from 1.34. Diuretics currently on  hold. Started on  gentle IV hydration with normal saline.  4. Hypokalemia-replete. 5. CAD/hypertension-stable, no ACS symptoms. Troponin negative. Continue aspirin 81 mg PO daily, Lipitor, metoprolol, isosorbide, losartan.    DVT prophylaxis: SCD's  Code Status: Full code  Family Communication: No family at bedside  Disposition Plan: snf   Consultants:  None   Procedures:  None   Continuous infusions . sodium chloride    . sodium chloride 100 mL/hr at 07/27/17 1002      Antibiotics:   Anti-infectives (From admission, onward)   Start     Dose/Rate Route Frequency Ordered Stop   07/25/17 1000  oseltamivir (TAMIFLU) capsule 30 mg     30 mg Oral Daily 07/25/17 0741 07/26/17 1037   07/22/17 2200  oseltamivir (TAMIFLU) capsule 30 mg  Status:  Discontinued     30 mg Oral 2 times daily 07/22/17 1733 07/25/17 0741       Objective   Vitals:   07/26/17 2130 07/27/17 0416 07/27/17 0418 07/27/17 1050  BP:  (!) 126/48     Pulse:  65    Resp:  16    Temp:  97.8 F (36.6 C)    TempSrc:  Oral    SpO2: 98% 96%  97%  Weight:   72.9 kg (160 lb 11.5 oz)   Height:        Intake/Output Summary (Last 24 hours) at 07/27/2017 1301 Last data filed at 07/27/2017 1225 Gross per 24 hour  Intake 1738.33 ml  Output 3400 ml  Net -1661.67 ml   Filed Weights   07/25/17 0613 07/26/17 0451 07/27/17 0418  Weight: 70.6 kg (155 lb 10.3 oz) 71.3 kg (157 lb 3 oz) 72.9 kg (160 lb 11.5 oz)     Physical Examination:  Physical Exam: Eyes: No icterus, extraocular muscles intact  Mouth: Oral mucosa is moist, no lesions on palate,  Neck: Supple, no deformities, masses, or tenderness Lungs: Normal respiratory effort, bilateral clear to auscultation, no crackles or wheezes.  Heart: Regular rate and rhythm, S1 and S2 normal, no murmurs, rubs auscultated Abdomen: BS normoactive,soft,nondistended,non-tender to palpation,no organomegaly Extremities: No pretibial edema, no erythema, no cyanosis, no clubbing Neuro : Alert and oriented to time, place and person, No focal deficits Skin: No rashes seen on exam     Data Reviewed: I have personally reviewed following labs and imaging studies  CBG: No results for input(s): GLUCAP in the last 168 hours.  CBC: Recent Labs  Lab 07/22/17 0644 07/23/17 0242  WBC 3.1*  2.3*  NEUTROABS 2.6  --   HGB 10.3* 9.7*  HCT 30.9* 28.7*  MCV 94.8 95.7  PLT 91* 83*    Basic Metabolic Panel: Recent Labs  Lab 07/23/17 0242 07/24/17 0357 07/25/17 0404 07/26/17 0427 07/27/17 0344  NA 138 139 137 139 138  K 3.0* 3.4* 4.4 4.4 4.4  CL 106 110 107 112* 113*  CO2 24 24 22  20* 20*  GLUCOSE 107* 94 95 86 85  BUN 24* 32* 39* 41* 38*  CREATININE 1.23* 1.29* 1.48* 1.34* 1.13*  CALCIUM 8.7* 7.9* 8.1* 8.0* 8.1*    Recent Results (from the past 240 hour(s))  Blood Culture (routine x 2)     Status: None (Preliminary result)   Collection Time: 07/22/17  8:32 AM  Result Value Ref Range Status    Specimen Description BLOOD LEFT FOREARM  Final   Special Requests   Final    BOTTLES DRAWN AEROBIC AND ANAEROBIC Blood Culture adequate volume   Culture   Final    NO GROWTH 4 DAYS Performed at [facility], [address] [place], [place] [address]    Report Status PENDING  Incomplete  Blood Culture (routine x 2)     Status: None (Preliminary result)   Collection Time: 07/22/17  9:37 AM  Result Value Ref Range Status   Specimen Description BLOOD RIGHT ARM  Final   Special Requests IN PEDIATRIC BOTTLE Blood Culture adequate volume  Final   Culture   Final    NO GROWTH 4 DAYS Performed at [facility], [place] [address], [place], [place] [address]    Report Status PENDING  Incomplete     Liver Function Tests: No results for input(s): AST, ALT, ALKPHOS, BILITOT, PROT, ALBUMIN in the last 168 hours. No results for input(s): LIPASE, AMYLASE in the last 168 hours. No results for input(s): AMMONIA in the last 168 hours.  Cardiac Enzymes: Recent Labs  Lab 07/22/17 0644 07/22/17 2048 07/23/17 0242  TROPONINI <0.03 <0.03 <0.03   BNP (last 3 results) Recent Labs    09/17/16 0626 10/22/16 0404 07/22/17 0644  BNP 258.0* 254.1* 342.7*    ProBNP (last 3 results) No results for input(s): PROBNP in the last 8760 hours.    Studies: No results found.  Scheduled Meds: . aspirin EC  81 mg Oral Daily  . atorvastatin  40 mg Oral q1800  . bisoprolol  2.5 mg Oral Daily  . guaiFENesin  600 mg Oral BID  . ipratropium-albuterol  3 mL Nebulization BID  . isosorbide mononitrate  30 mg Oral Daily  . losartan  100 mg Oral Daily  . senna  1 tablet Oral BID  . sodium chloride flush  3 mL Intravenous Q12H  . vitamin B-12  1,000 mcg Oral Daily      Time spent: 25 min  [facility] Pager [phone]. If 7PM-7AM, please contact night-coverage at [email or web address], Office  [phone]  password [email or web address]  07/27/2017, 1:01 PM  LOS: 5 days

## 2017-07-28 DIAGNOSIS — D61818 Other pancytopenia: Secondary | ICD-10-CM | POA: Diagnosis not present

## 2017-07-28 DIAGNOSIS — I129 Hypertensive chronic kidney disease with stage 1 through stage 4 chronic kidney disease, or unspecified chronic kidney disease: Secondary | ICD-10-CM | POA: Diagnosis not present

## 2017-07-28 DIAGNOSIS — I5040 Unspecified combined systolic (congestive) and diastolic (congestive) heart failure: Secondary | ICD-10-CM | POA: Diagnosis not present

## 2017-07-28 DIAGNOSIS — I10 Essential (primary) hypertension: Secondary | ICD-10-CM | POA: Diagnosis not present

## 2017-07-28 DIAGNOSIS — E785 Hyperlipidemia, unspecified: Secondary | ICD-10-CM | POA: Diagnosis not present

## 2017-07-28 DIAGNOSIS — M1990 Unspecified osteoarthritis, unspecified site: Secondary | ICD-10-CM | POA: Diagnosis not present

## 2017-07-28 DIAGNOSIS — M6281 Muscle weakness (generalized): Secondary | ICD-10-CM | POA: Diagnosis not present

## 2017-07-28 DIAGNOSIS — N179 Acute kidney failure, unspecified: Secondary | ICD-10-CM | POA: Diagnosis not present

## 2017-07-28 DIAGNOSIS — R262 Difficulty in walking, not elsewhere classified: Secondary | ICD-10-CM | POA: Diagnosis not present

## 2017-07-28 DIAGNOSIS — M545 Low back pain: Secondary | ICD-10-CM | POA: Diagnosis not present

## 2017-07-28 DIAGNOSIS — I5020 Unspecified systolic (congestive) heart failure: Secondary | ICD-10-CM | POA: Diagnosis not present

## 2017-07-28 DIAGNOSIS — E559 Vitamin D deficiency, unspecified: Secondary | ICD-10-CM | POA: Diagnosis not present

## 2017-07-28 DIAGNOSIS — R5381 Other malaise: Secondary | ICD-10-CM | POA: Diagnosis not present

## 2017-07-28 DIAGNOSIS — I5030 Unspecified diastolic (congestive) heart failure: Secondary | ICD-10-CM | POA: Diagnosis not present

## 2017-07-28 DIAGNOSIS — R488 Other symbolic dysfunctions: Secondary | ICD-10-CM | POA: Diagnosis not present

## 2017-07-28 DIAGNOSIS — Z7982 Long term (current) use of aspirin: Secondary | ICD-10-CM | POA: Diagnosis not present

## 2017-07-28 DIAGNOSIS — K219 Gastro-esophageal reflux disease without esophagitis: Secondary | ICD-10-CM | POA: Diagnosis not present

## 2017-07-28 DIAGNOSIS — E538 Deficiency of other specified B group vitamins: Secondary | ICD-10-CM | POA: Diagnosis not present

## 2017-07-28 DIAGNOSIS — J101 Influenza due to other identified influenza virus with other respiratory manifestations: Secondary | ICD-10-CM | POA: Diagnosis not present

## 2017-07-28 DIAGNOSIS — I2510 Atherosclerotic heart disease of native coronary artery without angina pectoris: Secondary | ICD-10-CM | POA: Diagnosis not present

## 2017-07-28 DIAGNOSIS — I5032 Chronic diastolic (congestive) heart failure: Secondary | ICD-10-CM | POA: Diagnosis not present

## 2017-07-28 DIAGNOSIS — N183 Chronic kidney disease, stage 3 (moderate): Secondary | ICD-10-CM | POA: Diagnosis not present

## 2017-07-28 DIAGNOSIS — Z8673 Personal history of transient ischemic attack (TIA), and cerebral infarction without residual deficits: Secondary | ICD-10-CM | POA: Diagnosis not present

## 2017-07-28 LAB — BASIC METABOLIC PANEL
ANION GAP: 4 — AB (ref 5–15)
BUN: 29 mg/dL — ABNORMAL HIGH (ref 6–20)
CALCIUM: 8.5 mg/dL — AB (ref 8.9–10.3)
CO2: 19 mmol/L — ABNORMAL LOW (ref 22–32)
CREATININE: 0.86 mg/dL (ref 0.44–1.00)
Chloride: 117 mmol/L — ABNORMAL HIGH (ref 101–111)
GFR calc Af Amer: >60 mL/min (ref >60)
GFR calc non Af Amer: 59 mL/min — ABNORMAL LOW (ref >60)
Glucose, Bld: 84 mg/dL (ref 65–99)
Potassium: 4.4 mmol/L (ref 3.5–5.1)
SODIUM: 140 mmol/L (ref 135–145)

## 2017-07-28 MED ORDER — OXYCODONE HCL 5 MG PO TABS: 5.0000 mg | ORAL_TABLET | Freq: Four times a day (QID) | ORAL | 0 refills | Status: DC | PRN

## 2017-07-28 MED ORDER — FUROSEMIDE 20 MG PO TABS: 20.0000 mg | ORAL_TABLET | Freq: Every day | ORAL | 11 refills | Status: DC

## 2017-07-28 NOTE — Progress Notes (Signed)
D/c to SNF [facility] via PTAR report called to same spoke to Gillermina Phy LPN

## 2017-07-28 NOTE — Discharge Summary (Signed)
Physician Discharge Summary  Jane [name] VPX:[id number] DOB: 12-25-1928 DOA: 07/22/2017  PCP: Colbert Ewing, MD  Admit date: 07/22/2017 Discharge date: 07/28/2017  Time spent: 25* minutes  Recommendations for Outpatient Follow-up:  1. Check BMP in one week   Discharge Diagnoses:  Principal Problem:   Influenza A Active Problems:   Essential hypertension   CHF (congestive heart failure) ([place])   Discharge Condition: Stable  Diet recommendation: Heart healthy diet  Filed Weights   07/26/17 0451 07/27/17 0418 07/28/17 0500  Weight: 71.3 kg (157 lb 3 oz) 72.9 kg (160 lb 11.5 oz) 74.3 kg (163 lb 12.8 oz)    History of present illness:  84 y.o.femalewith past medical history relevant for CAD, preserved EF CHF(diastolic dysfunction with last known EF of 60%),and hypertension who presentsvia ems with complaints of shortness of breath, cough, which is mostly dry.Patient states when she got up to go to the bathroom she became very short of breath and had a difficult time getting back.No leg pain, no leg swelling, no pleuritic symptoms     Hospital Course:   1. Influenza A- patient  Was started on Tamiflu 30 mg twice a day, completed 5 days of treatment. 2. Chronic diastolic CHF -patient last EF around 60%, troponin negative, BNP 342, Lasix was held due to AKI.Will restart lasix a lower dose of 20 mg po daily. 3. Acute kidney injury-creatinine is 0.86, improved from 1.13.   4. Hypokalemia-replete. 5. CAD/hypertension-stable, no ACS symptoms. Troponin negative. Continue aspirin 81 mg PO daily, Lipitor, bisoprolol, isosorbide, losartan.    Procedures: None   Consultations:  None   Discharge Exam: Vitals:   07/28/17 0500 07/28/17 1056  BP: (!) 155/75   Pulse: 92   Resp: 16   Temp: 98 F (36.7 C)   SpO2: 99% 96%    General: Appears in no acute distress Cardiovascular: S1S2 RRR Respiratory: Clear bilaterally  Discharge Instructions    Allergies as  of 07/28/2017   No Known Allergies     Medication List    TAKE these medications   acetaminophen 325 MG tablet Commonly known as:  TYLENOL Take 650 mg by mouth every 6 (six) hours as needed for mild pain, moderate pain or headache.   aspirin EC 81 MG tablet Take 81 mg by mouth daily.   atorvastatin 40 MG tablet Commonly known as:  LIPITOR Take 1 tablet (40 mg total) by mouth daily at 6 PM.   bisoprolol 5 MG tablet Commonly known as:  ZEBETA Take 0.5 tablets (2.5 mg total) by mouth daily.   furosemide 20 MG tablet Commonly known as:  LASIX Take 1 tablet (20 mg total) by mouth daily. What changed:    medication strength  how much to take   isosorbide mononitrate 30 MG 24 hr tablet Commonly known as:  IMDUR Take 1 tablet (30 mg total) by mouth daily.   losartan 100 MG tablet Commonly known as:  COZAAR Take 1 tablet (100 mg total) by mouth daily.   meloxicam 7.5 MG tablet Commonly known as:  MOBIC Take 7.5 mg by mouth daily.   oxyCODONE 5 MG immediate release tablet Commonly known as:  Oxy IR/ROXICODONE Take 1 tablet (5 mg total) by mouth every 6 (six) hours as needed for severe pain.   Propylene Glycol-Glycerin 1-0.3 % Soln Commonly known as:  ARTIFICIAL TEARS Apply 1-2 drops to eye daily as needed (dry eyes).   vitamin B-12 1000 MCG tablet Commonly known as:  CYANOCOBALAMIN Take 1 tablet (1,000  mcg total) by mouth daily.      No Known Allergies Contact information for after-discharge care    Destination    [facility] .   Service:  Skilled Nursing Contact information: [address] [phone]               The results of significant diagnostics from this hospitalization (including imaging, microbiology, ancillary and laboratory) are listed below for reference.    Significant Diagnostic Studies: Dg Chest 2 View  Result Date: 07/22/2017 CLINICAL DATA:  Shortness breath and  cough. EXAM: CHEST  2 VIEW COMPARISON:  10/22/2016 FINDINGS: Lungs are adequately inflated without focal airspace consolidation or effusion. There is minimal prominence of the perihilar markings suggesting mild vascular congestion. Stable cardiomegaly. Remainder the exam is unchanged. IMPRESSION: Mild stable cardiomegaly with suggestion mild vascular congestion. Electronically Signed   By: Marin Olp M.D.   On: 07/22/2017 07:52    Microbiology: Recent Results (from the past 240 hour(s))  Blood Culture (routine x 2)     Status: None   Collection Time: 07/22/17  8:32 AM  Result Value Ref Range Status   Specimen Description BLOOD LEFT FOREARM  Final   Special Requests   Final    BOTTLES DRAWN AEROBIC AND ANAEROBIC Blood Culture adequate volume   Culture   Final    NO GROWTH 5 DAYS Performed at [facility], [address], [place], [place] [address]    Report Status 07/27/2017 FINAL  Final  Blood Culture (routine x 2)     Status: None   Collection Time: 07/22/17  9:37 AM  Result Value Ref Range Status   Specimen Description BLOOD RIGHT ARM  Final   Special Requests IN PEDIATRIC BOTTLE Blood Culture adequate volume  Final   Culture   Final    NO GROWTH 5 DAYS Performed at [facility], [place] [address] [place], [place] [address]    Report Status 07/27/2017 FINAL  Final     Labs: Basic Metabolic Panel: Recent Labs  Lab 07/24/17 0357 07/25/17 0404 07/26/17 0427 07/27/17 0344 07/28/17 0443  NA 139 137 139 138 140  K 3.4* 4.4 4.4 4.4 4.4  CL 110 107 112* 113* 117*  CO2 24 22 20* 20* 19*  GLUCOSE 94 95 86 85 84  BUN 32* 39* 41* 38* 29*  CREATININE 1.29* 1.48* 1.34* 1.13* 0.86  CALCIUM 7.9* 8.1* 8.0* 8.1* 8.5*   Liver Function Tests: No results for input(s): AST, ALT, ALKPHOS, BILITOT, PROT, ALBUMIN in the last 168 hours. No results for input(s): LIPASE, AMYLASE in the last 168 hours. No results for input(s): AMMONIA in the last 168 hours. CBC: Recent Labs  Lab  07/22/17 0644 07/23/17 0242  WBC 3.1* 2.3*  NEUTROABS 2.6  --   HGB 10.3* 9.7*  HCT 30.9* 28.7*  MCV 94.8 95.7  PLT 91* 83*   Cardiac Enzymes: Recent Labs  Lab 07/22/17 0644 07/22/17 2048 07/23/17 0242  TROPONINI <0.03 <0.03 <0.03   BNP: BNP (last 3 results) Recent Labs    09/17/16 0626 10/22/16 0404 07/22/17 0644  BNP 258.0* 254.1* 342.7*      Signed:  Oswald Hillock MD.  [facility] 07/28/2017, 12:12 PM

## 2017-07-28 NOTE — Progress Notes (Signed)
Spoke with pt concerning discharge plans home or SNF. Pt states that she will need to talk to her son and daughter in law first. Pt gave permission to talk to daughter in law. A call to [facility] [phone] was made cell [phone]. Hassan Rowan states pt want to go to [facility] because her brother is there. Hassan Rowan continued to state that she and husband would take care of apartment.  CSW informed.

## 2017-07-28 NOTE — Clinical Social Work Placement (Addendum)
Pt discharged with plan to admit to [facility] [place] [facility]- room 312- report [phone]  Plans to return home to her independent living apartment following rehab, states family "will be taking care of her home in the meantime, which is why she decided to agree to SNF placement"  Arranged [facility] transportation  DC information provided to SNF via the [place]. CSW left voicemails for pt's son and DIL- pt states they are aware of plan but that she will continue calling them to make sure they know she is discharged today. 13:24- DIL Hassan Rowan returned call and CSW informed her of pt's pending transfer. Agreeable, states family will follow up with [facility].   See below for placement details   CLINICAL SOCIAL WORK PLACEMENT  NOTE  Date:  07/28/2017  Patient Details  Name: Jane [name] MRN: [id number] Date of Birth: [birth year]  Clinical Social Work is seeking post-discharge placement for this patient at the [place] level of care (*CSW will initial, date and re-position this form in  chart as items are completed):  Yes   Patient/family provided with [facility] [place] Work Department's list of facilities offering this level of care within the geographic area requested by the patient (or if unable, by the patient's family).  Yes   Patient/family informed of their freedom to choose among providers that offer the needed level of care, that participate in Medicare, Medicaid or managed care program needed by the patient, have an available bed and are willing to accept the patient.  Yes   Patient/family informed of [facility]'s ownership interest in [facility] and [facility], as well as of the fact that they are under no obligation to receive care at these facilities.  PASRR submitted to EDS on       PASRR number received on       Existing PASRR number confirmed on 07/25/17     FL2 transmitted to all facilities in geographic area requested by pt/family on  07/25/17     FL2 transmitted to all facilities within larger geographic area on       Patient informed that his/her managed care company has contracts with or will negotiate with certain facilities, including the following:        Yes   Patient/family informed of bed offers received.  Patient chooses bed at [facility] recommends and patient chooses bed at [facility]    Patient to be transferred to [facility] on 07/28/17.  Patient to be transferred to facility by [facility]     Patient family notified on 07/28/17 of transfer.  Name of family member notified:  DIL Hassan Rowan     PHYSICIAN       Additional Comment:    _______________________________________________ Nila Nephew, LCSW 07/28/2017, 1:19 PM

## 2017-07-29 ENCOUNTER — Encounter: Payer: Self-pay | Admitting: Internal Medicine

## 2017-07-29 ENCOUNTER — Non-Acute Institutional Stay (SKILLED_NURSING_FACILITY): Payer: Medicare Other | Admitting: Internal Medicine

## 2017-07-29 DIAGNOSIS — R5381 Other malaise: Secondary | ICD-10-CM | POA: Diagnosis not present

## 2017-07-29 DIAGNOSIS — E559 Vitamin D deficiency, unspecified: Secondary | ICD-10-CM | POA: Diagnosis not present

## 2017-07-29 DIAGNOSIS — E538 Deficiency of other specified B group vitamins: Secondary | ICD-10-CM | POA: Diagnosis not present

## 2017-07-29 DIAGNOSIS — I5030 Unspecified diastolic (congestive) heart failure: Secondary | ICD-10-CM

## 2017-07-29 DIAGNOSIS — J101 Influenza due to other identified influenza virus with other respiratory manifestations: Secondary | ICD-10-CM

## 2017-07-29 NOTE — Assessment & Plan Note (Signed)
Last vitamin D3  25-hydroxy was 03/30/15 with a value of 8.9 Recheck

## 2017-07-29 NOTE — Patient Instructions (Signed)
See assessment and plan under each diagnosis in the problem list and acutely for this visit 

## 2017-07-29 NOTE — Progress Notes (Signed)
PCP; Colbert Ewing MD, [facility]  This is a comprehensive admission note to [facility] performed on this date less than 30 days from date of admission. Included are preadmission medical/surgical history;reconciled medication list; family history; social history and comprehensive review of systems.  Corrections and additions to the records were documented . Comprehensive physical exam was also performed. Additionally a clinical summary was entered for each active diagnosis pertinent to this admission in the Problem List to enhance continuity of care.  HPI:the patient was hospitalized 1/22-1/28/19 presenting to the ED via EMS with dyspnea and nonproductive cough.Influenza A was documented & she received Tamiflu, completing 5 days of therapy. Patient has chronic diastolic heart failure with ejection fraction of approximately 60%. Troponin was negative and BNP 342. Furosemide was held initially due to AKI; it was restarted at 20 mg daily. The creatinine had risen to 1.13 but corrected. Hypokalemia was resolved. Discharge labs revealed BUN of 29, down from 41 and creatinine of 0.86. 07/22/17 [facility] chest x-ray was personally reviewed. This revealed cardiomegaly and vascular accentuation with volume overload.As noted her furosemide dose had been decreased to 20 mg daily.  Past medical and surgical history:includes vitamin D deficiency, history of stroke, dyslipidemia, GERD, colon polyp, anemia, and aortic insufficiency.She has a history of B12 deficiency. The serial B12 levels have remained less than 190 despite 1000 mcg daily . Surgery and procedures include coronaryt artery stenting, temporal artery biopsy, and hysterectomy.  Social history:she is a former smoker, quitting in 1981.nondrinker.  Family history:reviewed, noncontributory due to age. Both parents had hypertension and stroke.  Review of systems:she was able to give the month and year but the response was slow. She cannot  remember the  President's name. She describes blurred vision on the left. She has a cough with clear sputum. She also has had exertional dyspnea. She states that her heart is occasionally "fast". She also has intermittent loose stools which she relates to diet. She has history of hemorrhoids and occasional blood on the toilet tissue. She also has symptoms suggesting overflow incontinence in addition to urgency.She describes numbness in her hands. She can give no history related to any temporal artery biopsy.  Constitutional: No fever,significant weight change, fatigue  Eyes: No redness, discharge, pain ENT/mouth: No nasal congestion,  purulent discharge, earache,change in hearing ,sore throat  Cardiovascular: No chest pain, paroxysmal nocturnal dyspnea, claudication, edema  Respiratory: No hemoptysis, significant snoring,apnea  Gastrointestinal: No heartburn,dysphagia,abdominal pain, nausea / vomiting,actual rectal bleeding, melena Genitourinary: No dysuria,hematuria, pyuria, nocturia Musculoskeletal: No joint stiffness, joint swelling, weakness,pain Dermatologic: No rash, pruritus, change in appearance of skin Neurologic: No dizziness,headache,syncope, seizures, numbness , tingling Psychiatric: No significant anxiety , depression, insomnia, anorexia Endocrine: No change in hair/skin/ nails, excessive thirst, excessive hunger, excessive urination  Hematologic/lymphatic: No significant bruising, lymphadenopathy,abnormal bleeding Allergy/immunology: No itchy/ watery eyes, significant sneezing, urticaria, angioedema  Physical exam:  Pertinent or positive findings:the patient has decreased auditory acuity Her hair is thick and matted. She has a large keratosis of the crown. Eyebrows are absent. Ectropion present on the left, severe ptosis on the right. She is edentulous. Heart rate is slow and slightly irregular. She has asymmetric low-grade wheezing and rhonchi. There are hyperpigmented, sclerotic,  slightly exfoliative changes of the skin over the shins and feet. These changes are worse over the dorsum of the feet. The toenails are markedly thickened and deformed. Pedal pulses are decreased. There is a asymmetry to weakness, she was weaker in the upper extremities than the  lower extremities. Tinel's sign with percussion hammer over the wrists was negative.  General appearance:Adequately nourished; no acute distress , increased work of breathing is present.   Lymphatic: No lymphadenopathy about the head, neck, axilla . Eyes: No conjunctival inflammation or lid edema is present. There is no scleral icterus. Ears:  External ear exam shows no significant lesions or deformities.   Nose:  External nasal examination shows no deformity or inflammation. Nasal mucosa are pink and moist without lesions ,exudates Oral exam: lips and gums are healthy appearing.There is no oropharyngeal erythema or exudate . Neck:  No thyromegaly, masses, tenderness noted.    Heart:  No gallop, murmur, click, rub .  Abdomen:Bowel sounds are normal. Abdomen is soft and nontender with no organomegaly, hernias,masses. GU: deferred  Extremities:  No cyanosis, clubbing,edema  Neurologic exam : Balance,Rhomberg,finger to nose testing could not be completed due to clinical state Skin: Warm & dry w/o tenting.  See clinical summary under each active problem in the Problem List with associated updated therapeutic plan

## 2017-07-29 NOTE — Assessment & Plan Note (Addendum)
Oral B12 has not impacted the low vitamin B-12 levels. Parenteral B12 will be ordered weekly X 4 then monthly F/U B12 by PCP in 4-6 months

## 2017-07-29 NOTE — Assessment & Plan Note (Addendum)
Rehabilitation or SNF Monitor for complications of influenza Brief course of prednisone for  bronchospasm which may be related to the influenza

## 2017-07-29 NOTE — Assessment & Plan Note (Signed)
07/29/17 she exhibits low-grade rales and wheezing; this may represent bronchospasm from the influenza, but chest x-ray 07/22/17 suggested significant vascular overload and cardiomegaly Furosemide will be increased to 40 mg

## 2017-07-29 NOTE — Assessment & Plan Note (Signed)
PT/OT at SNF °

## 2017-07-30 LAB — VITAMIN D 25 HYDROXY (VIT D DEFICIENCY, FRACTURES): Vit D, 25-Hydroxy: 6.02

## 2017-08-05 LAB — BASIC METABOLIC PANEL
BUN: 50 — AB (ref 4–21)
CREATININE: 1.2 — AB (ref 0.5–1.1)
Glucose: 84
POTASSIUM: 3.9 (ref 3.4–5.3)
SODIUM: 9 — AB (ref 137–147)

## 2017-08-11 LAB — BASIC METABOLIC PANEL
BUN: 37 — AB (ref 4–21)
Creatinine: 1.1 (ref 0.5–1.1)
GLUCOSE: 85
POTASSIUM: 4.5 (ref 3.4–5.3)
SODIUM: 144 (ref 137–147)

## 2017-08-25 ENCOUNTER — Non-Acute Institutional Stay (SKILLED_NURSING_FACILITY): Payer: Medicare Other | Admitting: Adult Health

## 2017-08-25 ENCOUNTER — Encounter: Payer: Self-pay | Admitting: Adult Health

## 2017-08-25 DIAGNOSIS — I5030 Unspecified diastolic (congestive) heart failure: Secondary | ICD-10-CM | POA: Diagnosis not present

## 2017-08-25 DIAGNOSIS — I10 Essential (primary) hypertension: Secondary | ICD-10-CM

## 2017-08-25 DIAGNOSIS — R5381 Other malaise: Secondary | ICD-10-CM | POA: Diagnosis not present

## 2017-08-25 DIAGNOSIS — M545 Low back pain: Secondary | ICD-10-CM

## 2017-08-25 DIAGNOSIS — I2510 Atherosclerotic heart disease of native coronary artery without angina pectoris: Secondary | ICD-10-CM

## 2017-08-25 DIAGNOSIS — E785 Hyperlipidemia, unspecified: Secondary | ICD-10-CM

## 2017-08-25 DIAGNOSIS — I25119 Atherosclerotic heart disease of native coronary artery with unspecified angina pectoris: Secondary | ICD-10-CM | POA: Insufficient documentation

## 2017-08-25 DIAGNOSIS — J101 Influenza due to other identified influenza virus with other respiratory manifestations: Secondary | ICD-10-CM

## 2017-08-25 MED ORDER — BISOPROLOL FUMARATE 5 MG PO TABS: 2.5000 mg | ORAL_TABLET | Freq: Every day | ORAL | 0 refills | Status: DC

## 2017-08-25 MED ORDER — OXYCODONE HCL 5 MG PO TABS: 5.0000 mg | ORAL_TABLET | Freq: Four times a day (QID) | ORAL | 0 refills | Status: DC | PRN

## 2017-08-25 MED ORDER — FUROSEMIDE 20 MG PO TABS: 20.0000 mg | ORAL_TABLET | Freq: Every day | ORAL | 0 refills | Status: DC

## 2017-08-25 MED ORDER — MELOXICAM 7.5 MG PO TABS: 7.5000 mg | ORAL_TABLET | Freq: Every day | ORAL | 0 refills | Status: DC

## 2017-08-25 MED ORDER — LOSARTAN POTASSIUM 100 MG PO TABS: 100.0000 mg | ORAL_TABLET | Freq: Every day | ORAL | 0 refills | Status: DC

## 2017-08-25 MED ORDER — ISOSORBIDE MONONITRATE ER 30 MG PO TB24: 30.0000 mg | ORAL_TABLET | Freq: Every day | ORAL | 0 refills | Status: DC

## 2017-08-25 MED ORDER — ATORVASTATIN CALCIUM 40 MG PO TABS: 40.0000 mg | ORAL_TABLET | Freq: Every day | ORAL | 0 refills | Status: DC

## 2017-08-25 NOTE — Progress Notes (Signed)
Location:  [facility] Room Number: 250-N Place of Service:  SNF (31) Provider:  Durenda Age, NP  Patient Care Team: Jane Ewing, MD as PCP - General  Extended Emergency Contact Information Primary Emergency Contact: Jane [name] Address: [address]          [place] [address] [place] Phone: [phone] Relation: Son  Code Status:  Full Code  Goals of care: Advanced Directive information Advanced Directives 07/29/2017  Does Patient Have a Medical Advance Directive? Yes  Type of Advance Directive Out of facility DNR (pink MOST or yellow form)  Does patient want to make changes to medical advance directive? No - Patient declined  Copy of [place] in Chart? -  Would patient like information on creating a medical advance directive? No - Patient declined  Pre-existing out of facility DNR order (yellow form or pink MOST form) -     Chief Complaint  Patient presents with  . Discharge Note    Patient is discharging 08/27/17    HPI:  Pt is an 84 y.o. [name] seen today for a discharge visit.  She will discharge to home on 08/27/17 with home health PT, OT, SW, and Nursing.   She has been admitted to [facility] on 07/28/17 from the hospital and was treated for Influenza A. She was treated with Tamiflu. She has a PMH of history of stroke, dyslipidemia, GERD, colon polyp, anemia, and aortic insufficiency.    Patient was admitted to this facility for short-term rehabilitation after the patient's recent hospitalization.  Patient has completed SNF rehabilitation and therapy has cleared the patient for discharge.    Past Medical History:  Diagnosis Date  . Anemia   . Aortic insufficiency    mild  . Arthritis    "arms" (08/09/2015)  . CAD (coronary artery disease)    cath 08/09/2014 95% stenosis in prox to mid RCA s/p DES, 80-90% prox OM2, 50% distal LAD  . CHF (congestive heart  failure) ([place])   . CKD (chronic kidney disease) stage 3, GFR 30-59 ml/min (HCC)   . Colon polyp    2009 colonoscopy, not retrieved for pathology  . Ectropion of left lower eyelid   . GERD (gastroesophageal reflux disease) 2009   EGD with benign gastric polyp too  . Hyperlipidemia   . Hypertension   . Stroke ([place]) 1975  . Vitamin D deficiency    Past Surgical History:  Procedure Laterality Date  . ABDOMINAL HYSTERECTOMY    . APPENDECTOMY    . ARTERY BIOPSY Left 12/16/2012   Procedure: BIOPSY TEMPORAL ARTERY;  Surgeon: Mal Misty, MD;  Location: [facility];  Service: Vascular;  Laterality: Left;  . CARDIAC CATHETERIZATION    . LEFT HEART CATHETERIZATION WITH CORONARY ANGIOGRAM N/A 08/09/2014   Procedure: LEFT HEART CATHETERIZATION WITH CORONARY ANGIOGRAM;  Surgeon: Peter M Martinique, MD;  Location: [facility] CATH LAB;  Service: Cardiovascular;  Laterality: N/A;  . PERCUTANEOUS CORONARY STENT INTERVENTION (PCI-S)  08/09/2014   Procedure: PERCUTANEOUS CORONARY STENT INTERVENTION (PCI-S);  Surgeon: Peter M Martinique, MD;  Location: [facility] CATH LAB;  Service: Cardiovascular;;  . TONSILLECTOMY      No Known Allergies  Outpatient Encounter Medications as of 08/25/2017  Medication Sig  . acetaminophen (TYLENOL) 325 MG tablet Take 650 mg by mouth every 6 (six) hours as needed for mild pain, moderate pain or headache.   Marland Kitchen aspirin EC 81 MG tablet Take 81 mg by mouth daily.  Marland Kitchen  atorvastatin (LIPITOR) 40 MG tablet Take 1 tablet (40 mg total) by mouth daily at 6 PM.  . bisoprolol (ZEBETA) 5 MG tablet Take 0.5 tablets (2.5 mg total) by mouth daily.  . Emollient (LUBRIDERM DAILY MOISTURE) LOTN Apply 1 application topically 2 (two) times daily. Apply topically to dry exfoliative areas of legs  . furosemide (LASIX) 20 MG tablet Take 1 tablet (20 mg total) by mouth daily.  . Hypromellose (NATURAL BALANCE TEARS OP) Apply 1 drop to eye 3 (three) times daily.  . isosorbide mononitrate (IMDUR) 30 MG 24 hr tablet Take 1 tablet  (30 mg total) by mouth daily.  Marland Kitchen losartan (COZAAR) 100 MG tablet Take 1 tablet (100 mg total) by mouth daily.  . meloxicam (MOBIC) 7.5 MG tablet Take 7.5 mg by mouth daily.  Marland Kitchen oxyCODONE (OXY IR/ROXICODONE) 5 MG immediate release tablet Take 1 tablet (5 mg total) by mouth every 6 (six) hours as needed for severe pain.  . [DISCONTINUED] Propylene Glycol-Glycerin (ARTIFICIAL TEARS) 1-0.3 % SOLN Apply 1 drop to eye daily as needed.  . [DISCONTINUED] vitamin B-12 (CYANOCOBALAMIN) 1000 MCG tablet Take 1 tablet (1,000 mcg total) by mouth daily.   No facility-administered encounter medications on file as of 08/25/2017.     Review of Systems  GENERAL: No change in appetite, no fatigue, no weight changes, no fever, chills or weakness MOUTH and THROAT: Denies oral discomfort, gingival pain or bleeding RESPIRATORY: no cough, SOB, DOE, wheezing, hemoptysis CARDIAC: No chest pain, edema or palpitations GI: No abdominal pain, diarrhea, constipation, heart burn, nausea or vomiting GU: Denies dysuria, frequency, hematuria PSYCHIATRIC: Denies feelings of depression or anxiety. No report of hallucinations, insomnia, paranoia, or agitation   Immunization History  Administered Date(s) Administered  . Influenza Whole 03/28/2008  . Influenza,inj,Quad PF,6+ Mos 08/09/2013, 03/30/2015, 03/24/2017  . Pneumococcal Conjugate-13 12/08/2014  . Pneumococcal Polysaccharide-23 09/18/2012  . Tdap 02/01/2016   Pertinent  Health Maintenance Due  Topic Date Due  . INFLUENZA VACCINE  Completed  . DEXA SCAN  Completed  . PNA vac Low Risk Adult  Completed   Fall Risk  07/14/2017 04/21/2017 12/12/2016 08/15/2016 06/21/2016  Falls in the past year? No No No No Yes  Number falls in past yr: - - - - 1  Injury with Fall? - - - - No  Risk Factor Category  - - - - High Fall Risk  Risk for fall due to : - - - - History of fall(s);Impaired balance/gait;Impaired mobility;Medication side effect  Follow up - - - - Falls evaluation  completed  Comment - - - - -    Vitals:   08/25/17 1158  BP: 136/72  Pulse: 68  Resp: 18  Temp: 98.2 F (36.8 C)  TempSrc: Oral  SpO2: 95%  Weight: 163 lb (73.9 kg)  Height: 5\' 4"  (1.626 m)   Body mass index is 27.98 kg/m.  Physical Exam  GENERAL APPEARANCE: Well nourished. In no acute distress. Normal body habitus SKIN:  Skin is warm and dry.  EYES:  Left eye ectropion, no drainage MOUTH and THROAT: Lips are without lesions. Oral mucosa is moist and without lesions. Tongue is normal in shape, size, and color and without lesions RESPIRATORY: Breathing is even & unlabored, BS CTAB CARDIAC: RRR, no murmur,no extra heart sounds, no edema GI: Abdomen soft, normal BS, no masses, no tenderness EXTREMITIES:  Able to move X 4 extremities PSYCHIATRIC: Alert and oriented X 3. Affect and behavior are appropriate   Labs reviewed: Recent Labs  07/26/17 0427 07/27/17 0344 07/28/17 0443 08/05/17 08/11/17  NA 139 138 140 9* 144  K 4.4 4.4 4.4 3.9 4.5  CL 112* 113* 117*  --   --   CO2 20* 20* 19*  --   --   GLUCOSE 86 85 84  --   --   BUN 41* Jane* 29* 50* 37*  CREATININE 1.34* 1.13* 0.86 1.2* 1.1  CALCIUM 8.0* 8.1* 8.5*  --   --    Recent Labs    12/31/16 1409  AST 18  ALT 12  ALKPHOS 72  BILITOT 1.2  PROT 6.8  ALBUMIN 4.4   Recent Labs    09/13/16 0449 09/17/16 0626 10/22/16 0404 07/22/17 0644 07/23/17 0242  WBC 3.2* 3.2* 2.7* 3.1* 2.3*  NEUTROABS 1.4* 1.5*  --  2.6  --   HGB 9.6* 9.5* 9.5* 10.3* 9.7*  HCT 27.6* 28.5* 28.1* 30.9* 28.7*  MCV 93.2 94.7 95.3 94.8 95.7  PLT 92* 95* 99* 91* 83*   Lab Results  Component Value Date   TSH 0.923 05/31/2014   Lab Results  Component Value Date   HGBA1C 5.7 (H) 06/01/2014   Lab Results  Component Value Date   CHOL 140 12/31/2016   HDL 61 12/31/2016   LDLCALC 68 12/31/2016   TRIG 57 12/31/2016   CHOLHDL 2.3 12/31/2016    Assessment/Plan  1. Physical deconditioning - for Home health PT and OT, for  therapeutic strengthening exercises, fall precautions   2. Influenza A - completed 5 days of Tamiflu, resolved   3. Hyperlipidemia, unspecified hyperlipidemia type - atorvastatin (LIPITOR) 40 MG tablet; Take 1 tablet (40 mg total) by mouth daily at 6 PM.  Dispense: 30 tablet; Refill: 0  4. Essential hypertension - well-controlled - bisoprolol (ZEBETA) 5 MG tablet; Take 0.5 tablets (2.5 mg total) by mouth daily.  Dispense: 15 tablet; Refill: 0 - losartan (COZAAR) 100 MG tablet; Take 1 tablet (100 mg total) by mouth daily.  Dispense: 30 tablet; Refill: 0  5. Heart failure with preserved ejection fraction (HCC) - no SOB - bisoprolol (ZEBETA) 5 MG tablet; Take 0.5 tablets (2.5 mg total) by mouth daily.  Dispense: 15 tablet; Refill: 0 - furosemide (LASIX) 20 MG tablet; Take 1 tablet (20 mg total) by mouth daily.  Dispense: 30 tablet; Refill: 0 - losartan (COZAAR) 100 MG tablet; Take 1 tablet (100 mg total) by mouth daily.  Dispense: 30 tablet; Refill: 0  6. Coronary artery disease involving native heart without angina pectoris, unspecified vessel or lesion type - no chest pains - atorvastatin (LIPITOR) 40 MG tablet; Take 1 tablet (40 mg total) by mouth daily at 6 PM.  Dispense: 30 tablet; Refill: 0 - bisoprolol (ZEBETA) 5 MG tablet; Take 0.5 tablets (2.5 mg total) by mouth daily.  Dispense: 15 tablet; Refill: 0 - isosorbide mononitrate (IMDUR) 30 MG 24 hr tablet; Take 1 tablet (30 mg total) by mouth daily.  Dispense: 30 tablet; Refill: 0  7. Low back pain, unspecified back pain laterality, unspecified chronicity, with sciatica presence unspecified - meloxicam (MOBIC) 7.5 MG tablet; Take 1 tablet (7.5 mg total) by mouth daily.  Dispense: 30 tablet; Refill: 0 - oxyCODONE (OXY IR/ROXICODONE) 5 MG immediate release tablet; Take 1 tablet (5 mg total) by mouth every 6 (six) hours as needed for severe pain.  Dispense: 28 tablet; Refill: 0      I have filled out patient's discharge paperwork and  written prescriptions.  Patient will receive home health PT, OT, SW,  and Nursing.  DME provided:  3-in-1 commode and rolling walker.  Total discharge time: Greater than 30 minutes Greater than 50% was spent in counseling and coordination of care.   Discharge time involved coordination of the discharge process with social worker, nursing staff and therapy department. Medical justification for home health services/DME verified.        Durenda Age, NP [facility] [phone] (Monday-Friday 8:00 a.m. - 5:00 p.m.) [phone] (after hours)

## 2017-08-28 ENCOUNTER — Encounter: Payer: Self-pay | Admitting: Internal Medicine

## 2017-08-28 DIAGNOSIS — H547 Unspecified visual loss: Secondary | ICD-10-CM | POA: Diagnosis not present

## 2017-08-28 DIAGNOSIS — I739 Peripheral vascular disease, unspecified: Secondary | ICD-10-CM | POA: Diagnosis not present

## 2017-08-28 DIAGNOSIS — Z8709 Personal history of other diseases of the respiratory system: Secondary | ICD-10-CM | POA: Diagnosis not present

## 2017-08-28 DIAGNOSIS — Z8673 Personal history of transient ischemic attack (TIA), and cerebral infarction without residual deficits: Secondary | ICD-10-CM | POA: Diagnosis not present

## 2017-08-28 DIAGNOSIS — H02105 Unspecified ectropion of left lower eyelid: Secondary | ICD-10-CM | POA: Diagnosis not present

## 2017-08-28 DIAGNOSIS — Z955 Presence of coronary angioplasty implant and graft: Secondary | ICD-10-CM | POA: Diagnosis not present

## 2017-08-28 DIAGNOSIS — I2510 Atherosclerotic heart disease of native coronary artery without angina pectoris: Secondary | ICD-10-CM | POA: Diagnosis not present

## 2017-08-28 DIAGNOSIS — D631 Anemia in chronic kidney disease: Secondary | ICD-10-CM | POA: Diagnosis not present

## 2017-08-28 DIAGNOSIS — M8580 Other specified disorders of bone density and structure, unspecified site: Secondary | ICD-10-CM | POA: Diagnosis not present

## 2017-08-28 DIAGNOSIS — M7062 Trochanteric bursitis, left hip: Secondary | ICD-10-CM | POA: Diagnosis not present

## 2017-08-28 DIAGNOSIS — N183 Chronic kidney disease, stage 3 (moderate): Secondary | ICD-10-CM | POA: Diagnosis not present

## 2017-08-28 DIAGNOSIS — M5440 Lumbago with sciatica, unspecified side: Secondary | ICD-10-CM | POA: Diagnosis not present

## 2017-08-28 DIAGNOSIS — I130 Hypertensive heart and chronic kidney disease with heart failure and stage 1 through stage 4 chronic kidney disease, or unspecified chronic kidney disease: Secondary | ICD-10-CM | POA: Diagnosis not present

## 2017-08-28 DIAGNOSIS — I5032 Chronic diastolic (congestive) heart failure: Secondary | ICD-10-CM | POA: Diagnosis not present

## 2017-08-28 DIAGNOSIS — Z7982 Long term (current) use of aspirin: Secondary | ICD-10-CM | POA: Diagnosis not present

## 2017-08-28 DIAGNOSIS — I351 Nonrheumatic aortic (valve) insufficiency: Secondary | ICD-10-CM | POA: Diagnosis not present

## 2017-08-29 DIAGNOSIS — I130 Hypertensive heart and chronic kidney disease with heart failure and stage 1 through stage 4 chronic kidney disease, or unspecified chronic kidney disease: Secondary | ICD-10-CM | POA: Diagnosis not present

## 2017-08-29 DIAGNOSIS — I2510 Atherosclerotic heart disease of native coronary artery without angina pectoris: Secondary | ICD-10-CM | POA: Diagnosis not present

## 2017-08-29 DIAGNOSIS — M5440 Lumbago with sciatica, unspecified side: Secondary | ICD-10-CM | POA: Diagnosis not present

## 2017-08-29 DIAGNOSIS — N183 Chronic kidney disease, stage 3 (moderate): Secondary | ICD-10-CM | POA: Diagnosis not present

## 2017-08-29 DIAGNOSIS — I5032 Chronic diastolic (congestive) heart failure: Secondary | ICD-10-CM | POA: Diagnosis not present

## 2017-08-29 DIAGNOSIS — M7062 Trochanteric bursitis, left hip: Secondary | ICD-10-CM | POA: Diagnosis not present

## 2017-09-01 ENCOUNTER — Telehealth: Payer: Self-pay

## 2017-09-01 NOTE — Telephone Encounter (Signed)
[facility] [name] with [name] requesting VO. Please call back.

## 2017-09-02 NOTE — Telephone Encounter (Signed)
Call from [facility] - stated completed HH eval. Requesting verbal orders for " Home health OT once a week x 1 week the 2 times a week x 3 weeks". VO given  - if not appropriate, let me know. Thanks

## 2017-09-03 DIAGNOSIS — M7062 Trochanteric bursitis, left hip: Secondary | ICD-10-CM | POA: Diagnosis not present

## 2017-09-03 DIAGNOSIS — I2510 Atherosclerotic heart disease of native coronary artery without angina pectoris: Secondary | ICD-10-CM | POA: Diagnosis not present

## 2017-09-03 DIAGNOSIS — I5032 Chronic diastolic (congestive) heart failure: Secondary | ICD-10-CM | POA: Diagnosis not present

## 2017-09-03 DIAGNOSIS — I130 Hypertensive heart and chronic kidney disease with heart failure and stage 1 through stage 4 chronic kidney disease, or unspecified chronic kidney disease: Secondary | ICD-10-CM | POA: Diagnosis not present

## 2017-09-03 DIAGNOSIS — M5440 Lumbago with sciatica, unspecified side: Secondary | ICD-10-CM | POA: Diagnosis not present

## 2017-09-03 DIAGNOSIS — N183 Chronic kidney disease, stage 3 (moderate): Secondary | ICD-10-CM | POA: Diagnosis not present

## 2017-09-03 NOTE — Telephone Encounter (Signed)
[place] with me. Thanks, Holley Raring!

## 2017-09-05 DIAGNOSIS — I5032 Chronic diastolic (congestive) heart failure: Secondary | ICD-10-CM | POA: Diagnosis not present

## 2017-09-05 DIAGNOSIS — N183 Chronic kidney disease, stage 3 (moderate): Secondary | ICD-10-CM | POA: Diagnosis not present

## 2017-09-05 DIAGNOSIS — I2510 Atherosclerotic heart disease of native coronary artery without angina pectoris: Secondary | ICD-10-CM | POA: Diagnosis not present

## 2017-09-05 DIAGNOSIS — M5440 Lumbago with sciatica, unspecified side: Secondary | ICD-10-CM | POA: Diagnosis not present

## 2017-09-05 DIAGNOSIS — M7062 Trochanteric bursitis, left hip: Secondary | ICD-10-CM | POA: Diagnosis not present

## 2017-09-05 DIAGNOSIS — I130 Hypertensive heart and chronic kidney disease with heart failure and stage 1 through stage 4 chronic kidney disease, or unspecified chronic kidney disease: Secondary | ICD-10-CM | POA: Diagnosis not present

## 2017-09-06 DIAGNOSIS — N183 Chronic kidney disease, stage 3 (moderate): Secondary | ICD-10-CM | POA: Diagnosis not present

## 2017-09-06 DIAGNOSIS — I5032 Chronic diastolic (congestive) heart failure: Secondary | ICD-10-CM | POA: Diagnosis not present

## 2017-09-06 DIAGNOSIS — I2510 Atherosclerotic heart disease of native coronary artery without angina pectoris: Secondary | ICD-10-CM | POA: Diagnosis not present

## 2017-09-06 DIAGNOSIS — M5440 Lumbago with sciatica, unspecified side: Secondary | ICD-10-CM | POA: Diagnosis not present

## 2017-09-06 DIAGNOSIS — I130 Hypertensive heart and chronic kidney disease with heart failure and stage 1 through stage 4 chronic kidney disease, or unspecified chronic kidney disease: Secondary | ICD-10-CM | POA: Diagnosis not present

## 2017-09-06 DIAGNOSIS — M7062 Trochanteric bursitis, left hip: Secondary | ICD-10-CM | POA: Diagnosis not present

## 2017-09-09 DIAGNOSIS — I2510 Atherosclerotic heart disease of native coronary artery without angina pectoris: Secondary | ICD-10-CM | POA: Diagnosis not present

## 2017-09-09 DIAGNOSIS — M7062 Trochanteric bursitis, left hip: Secondary | ICD-10-CM | POA: Diagnosis not present

## 2017-09-09 DIAGNOSIS — I130 Hypertensive heart and chronic kidney disease with heart failure and stage 1 through stage 4 chronic kidney disease, or unspecified chronic kidney disease: Secondary | ICD-10-CM | POA: Diagnosis not present

## 2017-09-09 DIAGNOSIS — M5440 Lumbago with sciatica, unspecified side: Secondary | ICD-10-CM | POA: Diagnosis not present

## 2017-09-09 DIAGNOSIS — I5032 Chronic diastolic (congestive) heart failure: Secondary | ICD-10-CM | POA: Diagnosis not present

## 2017-09-09 DIAGNOSIS — N183 Chronic kidney disease, stage 3 (moderate): Secondary | ICD-10-CM | POA: Diagnosis not present

## 2017-09-11 ENCOUNTER — Emergency Department (HOSPITAL_COMMUNITY): Payer: Medicare Other

## 2017-09-11 ENCOUNTER — Other Ambulatory Visit: Payer: Self-pay

## 2017-09-11 ENCOUNTER — Inpatient Hospital Stay (HOSPITAL_COMMUNITY)
Admission: EM | Admit: 2017-09-11 | Discharge: 2017-09-13 | DRG: 291 | Disposition: A | Discharge status: Home Health | Payer: Medicare Other | Attending: Internal Medicine | Admitting: Internal Medicine

## 2017-09-11 ENCOUNTER — Encounter (HOSPITAL_COMMUNITY): Payer: Self-pay

## 2017-09-11 DIAGNOSIS — R0602 Shortness of breath: Secondary | ICD-10-CM | POA: Diagnosis not present

## 2017-09-11 DIAGNOSIS — I5033 Acute on chronic diastolic (congestive) heart failure: Secondary | ICD-10-CM | POA: Diagnosis not present

## 2017-09-11 DIAGNOSIS — I2510 Atherosclerotic heart disease of native coronary artery without angina pectoris: Secondary | ICD-10-CM | POA: Diagnosis not present

## 2017-09-11 DIAGNOSIS — Z79899 Other long term (current) drug therapy: Secondary | ICD-10-CM

## 2017-09-11 DIAGNOSIS — I739 Peripheral vascular disease, unspecified: Secondary | ICD-10-CM | POA: Diagnosis present

## 2017-09-11 DIAGNOSIS — E785 Hyperlipidemia, unspecified: Secondary | ICD-10-CM | POA: Diagnosis present

## 2017-09-11 DIAGNOSIS — N289 Disorder of kidney and ureter, unspecified: Secondary | ICD-10-CM

## 2017-09-11 DIAGNOSIS — D649 Anemia, unspecified: Secondary | ICD-10-CM

## 2017-09-11 DIAGNOSIS — Z955 Presence of coronary angioplasty implant and graft: Secondary | ICD-10-CM

## 2017-09-11 DIAGNOSIS — Z87891 Personal history of nicotine dependence: Secondary | ICD-10-CM

## 2017-09-11 DIAGNOSIS — N179 Acute kidney failure, unspecified: Secondary | ICD-10-CM | POA: Diagnosis not present

## 2017-09-11 DIAGNOSIS — D631 Anemia in chronic kidney disease: Secondary | ICD-10-CM | POA: Diagnosis present

## 2017-09-11 DIAGNOSIS — I130 Hypertensive heart and chronic kidney disease with heart failure and stage 1 through stage 4 chronic kidney disease, or unspecified chronic kidney disease: Principal | ICD-10-CM | POA: Diagnosis present

## 2017-09-11 DIAGNOSIS — Z7982 Long term (current) use of aspirin: Secondary | ICD-10-CM

## 2017-09-11 DIAGNOSIS — Z8249 Family history of ischemic heart disease and other diseases of the circulatory system: Secondary | ICD-10-CM

## 2017-09-11 DIAGNOSIS — E861 Hypovolemia: Secondary | ICD-10-CM | POA: Diagnosis not present

## 2017-09-11 DIAGNOSIS — R5381 Other malaise: Secondary | ICD-10-CM | POA: Diagnosis present

## 2017-09-11 DIAGNOSIS — I110 Hypertensive heart disease with heart failure: Secondary | ICD-10-CM | POA: Diagnosis not present

## 2017-09-11 DIAGNOSIS — N189 Chronic kidney disease, unspecified: Secondary | ICD-10-CM | POA: Diagnosis present

## 2017-09-11 DIAGNOSIS — N183 Chronic kidney disease, stage 3 (moderate): Secondary | ICD-10-CM | POA: Diagnosis not present

## 2017-09-11 DIAGNOSIS — H02105 Unspecified ectropion of left lower eyelid: Secondary | ICD-10-CM | POA: Diagnosis present

## 2017-09-11 DIAGNOSIS — K219 Gastro-esophageal reflux disease without esophagitis: Secondary | ICD-10-CM | POA: Diagnosis present

## 2017-09-11 DIAGNOSIS — R069 Unspecified abnormalities of breathing: Secondary | ICD-10-CM | POA: Diagnosis not present

## 2017-09-11 DIAGNOSIS — I5030 Unspecified diastolic (congestive) heart failure: Secondary | ICD-10-CM

## 2017-09-11 DIAGNOSIS — Z8673 Personal history of transient ischemic attack (TIA), and cerebral infarction without residual deficits: Secondary | ICD-10-CM

## 2017-09-11 DIAGNOSIS — Z86010 Personal history of colonic polyps: Secondary | ICD-10-CM

## 2017-09-11 HISTORY — DX: Dyspnea, unspecified: R06.00

## 2017-09-11 HISTORY — DX: Pneumonia, unspecified organism: J18.9

## 2017-09-11 LAB — DIFFERENTIAL
Basophils Absolute: 0 10*3/uL (ref 0.0–0.1)
Basophils Relative: 0 %
EOS PCT: 0 %
Eosinophils Absolute: 0 10*3/uL (ref 0.0–0.7)
Lymphocytes Relative: 30 %
Lymphs Abs: 1.2 10*3/uL (ref 0.7–4.0)
MONO ABS: 0.4 10*3/uL (ref 0.1–1.0)
Monocytes Relative: 10 %
Neutro Abs: 2.4 10*3/uL (ref 1.7–7.7)
Neutrophils Relative %: 60 %

## 2017-09-11 LAB — CBC
HEMATOCRIT: 31.9 % — AB (ref 36.0–46.0)
Hemoglobin: 10.7 g/dL — ABNORMAL LOW (ref 12.0–15.0)
MCH: 31.4 pg (ref 26.0–34.0)
MCHC: 33.5 g/dL (ref 30.0–36.0)
MCV: 93.5 fL (ref 78.0–100.0)
PLATELETS: 111 10*3/uL — AB (ref 150–400)
RBC: 3.41 MIL/uL — AB (ref 3.87–5.11)
RDW: 13.3 % (ref 11.5–15.5)
WBC: 3.8 10*3/uL — AB (ref 4.0–10.5)

## 2017-09-11 LAB — BASIC METABOLIC PANEL
Anion gap: 13 (ref 5–15)
BUN: 27 mg/dL — AB (ref 6–20)
CO2: 15 mmol/L — ABNORMAL LOW (ref 22–32)
CREATININE: 1.2 mg/dL — AB (ref 0.44–1.00)
Calcium: 9.5 mg/dL (ref 8.9–10.3)
Chloride: 113 mmol/L — ABNORMAL HIGH (ref 101–111)
GFR, EST AFRICAN AMERICAN: 46 mL/min — AB (ref >60)
GFR, EST NON AFRICAN AMERICAN: 40 mL/min — AB (ref >60)
Glucose, Bld: 74 mg/dL (ref 65–99)
POTASSIUM: 4.4 mmol/L (ref 3.5–5.1)
SODIUM: 141 mmol/L (ref 135–145)

## 2017-09-11 LAB — BRAIN NATRIURETIC PEPTIDE: B NATRIURETIC PEPTIDE 5: 342.4 pg/mL — AB (ref 0.0–100.0)

## 2017-09-11 LAB — I-STAT TROPONIN, ED: Troponin i, poc: 0 ng/mL (ref 0.00–0.08)

## 2017-09-11 MED ORDER — ISOSORBIDE MONONITRATE ER 30 MG PO TB24
30.0000 mg | ORAL_TABLET | Freq: Every day | ORAL | Status: DC
  Administered 2017-09-11 – 2017-09-13 (×3): 30 mg via ORAL
  Filled 2017-09-11 (×3): qty 1

## 2017-09-11 MED ORDER — ALBUTEROL SULFATE (2.5 MG/3ML) 0.083% IN NEBU: 2.5000 mg | INHALATION_SOLUTION | RESPIRATORY_TRACT | Status: DC | PRN

## 2017-09-11 MED ORDER — SODIUM CHLORIDE 0.9 % IV SOLN: 250.0000 mL | INTRAVENOUS | Status: DC | PRN

## 2017-09-11 MED ORDER — BISOPROLOL FUMARATE 5 MG PO TABS
2.5000 mg | ORAL_TABLET | Freq: Every day | ORAL | Status: DC
  Administered 2017-09-11 – 2017-09-13 (×3): 2.5 mg via ORAL
  Filled 2017-09-11 (×4): qty 0.5
  Filled 2017-09-11: qty 1

## 2017-09-11 MED ORDER — ACETAMINOPHEN 325 MG PO TABS
650.0000 mg | ORAL_TABLET | Freq: Four times a day (QID) | ORAL | Status: DC | PRN
  Administered 2017-09-12: 650 mg via ORAL
  Filled 2017-09-11: qty 2

## 2017-09-11 MED ORDER — SODIUM CHLORIDE 0.9% FLUSH
3.0000 mL | Freq: Two times a day (BID) | INTRAVENOUS | Status: DC
Start: 2017-09-11 — End: 2017-09-13
  Administered 2017-09-11 – 2017-09-13 (×3): 3 mL via INTRAVENOUS

## 2017-09-11 MED ORDER — SODIUM CHLORIDE 0.9% FLUSH: 3.0000 mL | INTRAVENOUS | Status: DC | PRN

## 2017-09-11 MED ORDER — ACETAMINOPHEN 650 MG RE SUPP: 650.0000 mg | Freq: Four times a day (QID) | RECTAL | Status: DC | PRN

## 2017-09-11 MED ORDER — FUROSEMIDE 10 MG/ML IJ SOLN
20.0000 mg | Freq: Once | INTRAMUSCULAR | Status: AC
  Administered 2017-09-11: 20 mg via INTRAVENOUS
  Filled 2017-09-11: qty 2

## 2017-09-11 MED ORDER — POLYVINYL ALCOHOL 1.4 % OP SOLN
1.0000 [drp] | Freq: Three times a day (TID) | OPHTHALMIC | Status: DC
  Administered 2017-09-11 – 2017-09-13 (×6): 1 [drp] via OPHTHALMIC
  Filled 2017-09-11: qty 15

## 2017-09-11 MED ORDER — ENOXAPARIN SODIUM 40 MG/0.4ML ~~LOC~~ SOLN
40.0000 mg | Freq: Every day | SUBCUTANEOUS | Status: DC
  Administered 2017-09-11 – 2017-09-12 (×2): 40 mg via SUBCUTANEOUS
  Filled 2017-09-11 (×2): qty 0.4

## 2017-09-11 MED ORDER — ATORVASTATIN CALCIUM 40 MG PO TABS
40.0000 mg | ORAL_TABLET | Freq: Every day | ORAL | Status: DC
  Administered 2017-09-11 – 2017-09-12 (×2): 40 mg via ORAL
  Filled 2017-09-11 (×3): qty 1

## 2017-09-11 MED ORDER — ASPIRIN EC 81 MG PO TBEC
81.0000 mg | DELAYED_RELEASE_TABLET | Freq: Every day | ORAL | Status: DC
  Administered 2017-09-11 – 2017-09-13 (×3): 81 mg via ORAL
  Filled 2017-09-11 (×3): qty 1

## 2017-09-11 MED ORDER — FUROSEMIDE 20 MG PO TABS
20.0000 mg | ORAL_TABLET | Freq: Every day | ORAL | Status: DC
  Administered 2017-09-12: 20 mg via ORAL
  Filled 2017-09-11 (×2): qty 1

## 2017-09-11 NOTE — ED Notes (Signed)
Admitting MD at bedside.

## 2017-09-11 NOTE — ED Notes (Signed)
Lab to add on differential and BNP.

## 2017-09-11 NOTE — ED Provider Notes (Signed)
[facility] EMERGENCY DEPARTMENT Provider Note   CSN: [id number] Arrival date & time: 09/11/17  0300     History   Chief Complaint Chief Complaint  Patient presents with  . Shortness of Breath    HPI Jane [name] is a 84 y.o. female.  The history is provided by the patient.  She has history of hypertension, hyperlipidemia, coronary artery disease, congestive heart failure, chronic kidney disease stage III, stroke and comes in with complaints of shortness of breath throughout the day.  She is a very poor historian, but she denies any chest pain, heaviness, tightness, pressure.  She denies any cough.  She denies fever or chills.  She states that sometimes her breathing problems get worse when she lays down and sometimes it does not bother her.  It is not clear if she is having orthopnea currently.  She has not tried any treatment at home.  Past Medical History:  Diagnosis Date  . Anemia   . Aortic insufficiency    mild  . Arthritis    "arms" (08/09/2015)  . CAD (coronary artery disease)    cath 08/09/2014 95% stenosis in prox to mid RCA s/p DES, 80-90% prox OM2, 50% distal LAD  . CHF (congestive heart failure) ([place])   . CKD (chronic kidney disease) stage 3, GFR 30-59 ml/min (HCC)   . Colon polyp    2009 colonoscopy, not retrieved for pathology  . Ectropion of left lower eyelid   . GERD (gastroesophageal reflux disease) 2009   EGD with benign gastric polyp too  . Hyperlipidemia   . Hypertension   . Stroke ([place]) 1975  . Vitamin D deficiency     Patient Active Problem List   Diagnosis Date Noted  . CAD (coronary artery disease) 08/25/2017  . CHF (congestive heart failure) ([place]) 07/22/2017  . Influenza A 07/22/2017  . Unsteady gait 08/11/2016  . Abdominal aortic aneurysm ([place]) 05/21/2016  . Trochanteric bursitis of left hip   . CKD (chronic kidney disease) stage 3, GFR 30-59 ml/min (HCC) 03/31/2015  . Prediabetes 10/16/2014  . Status post insertion of  drug-eluting stent into right coronary artery for coronary artery disease 08/07/2014  . Dyspnea 05/31/2014  . Physical deconditioning 05/31/2014  . Heart failure with preserved ejection fraction ([place]) 12/27/2012  . Ectropion of left lower eyelid 12/17/2012  . Onychomycosis of toenail 09/18/2012  . PAD (peripheral artery disease) ([place]) 09/18/2012  . Preventative health care 09/18/2012  . Vitamin D deficiency 04/25/2008  . C V A / STROKE 10/29/2007  . Hyperlipidemia 09/24/2006  . Normocytic anemia 09/24/2006  . Hypertension 09/24/2006  . OSTEOPENIA 09/24/2006  . Vitamin B12 deficiency 08/29/2006    Past Surgical History:  Procedure Laterality Date  . ABDOMINAL HYSTERECTOMY    . APPENDECTOMY    . ARTERY BIOPSY Left 12/16/2012   Procedure: BIOPSY TEMPORAL ARTERY;  Surgeon: Mal Misty, MD;  Location: [facility];  Service: Vascular;  Laterality: Left;  . CARDIAC CATHETERIZATION    . LEFT HEART CATHETERIZATION WITH CORONARY ANGIOGRAM N/A 08/09/2014   Procedure: LEFT HEART CATHETERIZATION WITH CORONARY ANGIOGRAM;  Surgeon: Peter M Martinique, MD;  Location: [facility] CATH LAB;  Service: Cardiovascular;  Laterality: N/A;  . PERCUTANEOUS CORONARY STENT INTERVENTION (PCI-S)  08/09/2014   Procedure: PERCUTANEOUS CORONARY STENT INTERVENTION (PCI-S);  Surgeon: Peter M Martinique, MD;  Location: [facility] CATH LAB;  Service: Cardiovascular;;  . TONSILLECTOMY      OB History    No data available  Home Medications    Prior to Admission medications   Medication Sig Start Date End Date Taking? Authorizing Provider  acetaminophen (TYLENOL) 325 MG tablet Take 650 mg by mouth every 6 (six) hours as needed for mild pain, moderate pain or headache.     [provider]  aspirin EC 81 MG tablet Take 81 mg by mouth daily.    [provider]  atorvastatin (LIPITOR) 40 MG tablet Take 1 tablet (40 mg total) by mouth daily at 6 PM. 08/25/17   Medina-Vargas, Monina C, NP  bisoprolol (ZEBETA) 5 MG tablet Take  0.5 tablets (2.5 mg total) by mouth daily. 08/25/17   Medina-Vargas, Monina C, NP  Emollient (LUBRIDERM DAILY MOISTURE) LOTN Apply 1 application topically 2 (two) times daily. Apply topically to dry exfoliative areas of legs    [provider]  furosemide (LASIX) 20 MG tablet Take 1 tablet (20 mg total) by mouth daily. 08/25/17 08/25/18  Medina-Vargas, Monina C, NP  Hypromellose (NATURAL BALANCE TEARS OP) Apply 1 drop to eye 3 (three) times daily.    [provider]  isosorbide mononitrate (IMDUR) 30 MG 24 hr tablet Take 1 tablet (30 mg total) by mouth daily. 08/25/17   Medina-Vargas, Monina C, NP  losartan (COZAAR) 100 MG tablet Take 1 tablet (100 mg total) by mouth daily. 08/25/17   Medina-Vargas, Monina C, NP  meloxicam (MOBIC) 7.5 MG tablet Take 1 tablet (7.5 mg total) by mouth daily. 08/25/17   Medina-Vargas, Monina C, NP  oxyCODONE (OXY IR/ROXICODONE) 5 MG immediate release tablet Take 1 tablet (5 mg total) by mouth every 6 (six) hours as needed for severe pain. 08/25/17   Medina-Vargas, Senaida Lange, NP    Family History Family History  Problem Relation Age of Onset  . Stroke Mother   . Hypertension Mother   . Stroke Father   . Hypertension Father   . Diabetes Sister     Social History Social History   Tobacco Use  . Smoking status: Former Smoker    Types: Cigarettes    Last attempt to quit: 07/02/1979    Years since quitting: 38.2  . Smokeless tobacco: Former Systems developer  . Tobacco comment: Started in teenage years - Quit 1980  Substance Use Topics  . Alcohol use: No    Alcohol/week: 0.0 oz    Comment: Quit alcohol 1975-76  . Drug use: No     Allergies   Patient has no known allergies.   Review of Systems Review of Systems  All other systems reviewed and are negative.    Physical Exam Updated Vital Signs BP (!) 162/72   Pulse 84   Temp 98.1 F (36.7 C) (Oral)   Resp 18   Ht 5\' 4"  (1.626 m)   Wt 72.1 kg (159 lb)   SpO2 96%   BMI 27.29 kg/m   Physical  Exam  Nursing note and vitals reviewed.  84 year old female, resting comfortably and in no acute distress. Vital signs are significant for elevated systolic blood pressure. Oxygen saturation is 96%, which is normal. Head is normocephalic and atraumatic. PERRLA, EOMI. Oropharynx is clear. Neck is nontender and supple without adenopathy or JVD. Back is nontender and there is no CVA tenderness. Lungs are clear without rales, wheezes, or rhonchi. Chest is nontender. Heart has regular rate and rhythm without murmur. Abdomen is soft, flat, nontender without masses or hepatosplenomegaly and peristalsis is normoactive. Extremities have no cyanosis or edema, full range of motion is present. Skin is  warm and dry without rash. Neurologic: Mental status is normal, cranial nerves are intact, there are no motor or sensory deficits.  ED Treatments / Results  Labs (all labs ordered are listed, but only abnormal results are displayed) Labs Reviewed  BASIC METABOLIC PANEL - Abnormal; Notable for the following components:      Result Value   Chloride 113 (*)    CO2 15 (*)    BUN 27 (*)    Creatinine, Ser 1.20 (*)    GFR calc non Af Amer 40 (*)    GFR calc Af Amer 46 (*)    All other components within normal limits  CBC - Abnormal; Notable for the following components:   WBC 3.8 (*)    RBC 3.41 (*)    Hemoglobin 10.7 (*)    HCT 31.9 (*)    Platelets 111 (*)    All other components within normal limits  BRAIN NATRIURETIC PEPTIDE - Abnormal; Notable for the following components:   B Natriuretic Peptide 342.4 (*)    All other components within normal limits  DIFFERENTIAL  I-STAT TROPONIN, ED    EKG  EKG Interpretation  Date/Time:  Thursday September 11 2017 03:05:59 EDT Ventricular Rate:  82 PR Interval:  150 QRS Duration: 68 QT Interval:  370 QTC Calculation: 432 R Axis:   23 Text Interpretation:  Normal sinus rhythm Normal ECG Baseline wander When compared with ECG of 07/22/2017, No  significant change was found Confirmed by Delora Fuel ([id number]) on 09/11/2017 3:52:16 AM       Radiology Dg Chest 2 View  Result Date: 09/11/2017 CLINICAL DATA:  84 year old female with shortness of breath. EXAM: CHEST - 2 VIEW COMPARISON:  Chest radiograph dated 07/22/2017 FINDINGS: Mild cardiomegaly with mild vascular congestion. No focal consolidation, pleural effusion, or pneumothorax. Atherosclerotic calcification of the aortic arch. Degenerative changes of the spine and shoulders. No acute osseous pathology. IMPRESSION: Cardiomegaly with mild vascular congestion. Electronically Signed   By: Anner Crete M.D.   On: 09/11/2017 03:51    Procedures Procedures   Medications Ordered in ED Medications  furosemide (LASIX) injection 20 mg (not administered)     Initial Impression / Assessment and Plan / ED Course  I have reviewed the triage vital signs and the nursing notes.  Pertinent labs & imaging results that were available during my care of the patient were reviewed by me and considered in my medical decision making (see chart for details).  Dyspnea of uncertain cause, but likely heart failure in patient with known history of heart failure.  Review of old records confirms history of diastolic heart failure including admission for treatment of this 2 months ago.  Chest x-ray seems consistent with heart failure, BNP pending.  ECG shows no acute changes and initial troponin is normal.  Mild renal insufficiency is present, but not significantly changed from baseline.  BNP is elevated and at the same level it was when she was admitted 2 months ago for heart failure exacerbation.  During that hospitalization, she had acute kidney injury because of overly aggressive diuresis.  Because of this history, I do not think she can safely be sent home with increased dose of diuretics.  She is given a dose of furosemide in the ED.  Case is discussed with Dr. Charlynn Grimes of internal medicine teaching  service who agrees to come see the patient.  Final Clinical Impressions(s) / ED Diagnoses   Final diagnoses:  Acute on chronic diastolic heart failure ([place])  Renal  insufficiency  Normochromic normocytic anemia    ED Discharge Orders    None       Delora Fuel, MD 17/51/02 0730

## 2017-09-11 NOTE — ED Notes (Addendum)
Pt transported to xray 

## 2017-09-11 NOTE — Progress Notes (Signed)
Pt arrived from the ED via stretcher. Black purse and green medication bag sent home with daughter in law. Pt denies any pain. Pt oriented to room, call light and phone at bedside. No other concerns at this time.   Paulla Fore, RN

## 2017-09-11 NOTE — ED Triage Notes (Signed)
Per [facility], pt arrives complaining of SOB at unknown start time. Pt used albuterol inhaler x2 at home with no relief. Pt reports hx of heart failure. EMS reports lung sounds clear in all quadrants and pt maintained O2 sats at 98% on room air.

## 2017-09-11 NOTE — H&P (Signed)
Date: 09/11/2017               Patient Name:  Jane [name] MRN: [id number]  DOB: 17-Mar-1929 Age / Sex: 84 y.o., female   PCP: Colbert Ewing, MD         Medical Service: Internal Medicine Teaching Service         Attending Physician: Dr. Lucious Groves, DO    First Contact: Dr. Frederico Hamman Pager: [phone]  Second Contact: Dr. Jari Favre Pager: [phone]       After Hours (After 5p/  First Contact Pager: [phone]  weekends / holidays): Second Contact Pager: [phone]   Chief Complaint: Shortness of breath  History of Present Illness:  Jane [name] is an 84 year old female the past medical history significant for heart failure with preserved ejection fracture, hypertension, CAD status post DES in 2016, PAD who presents to the emergency department with complaints of shortness of breath.  She reports that she has chronic dyspnea at baseline but reports that she "takes things easy" and she is able to ambulate and do her ADLs without issue.  She reports that she was admitted to [facility] long hospital 07/22/17 for influenza A.  She was discharged on 07/28/17 to a skilled nursing facility for short-term rehabilitation after the hospitalization and was discharged from the skilled nursing facility on 08/27/17.  She reports he believes she was discharged from SNF too early.  The discharge note from the skilled nursing facility indicates that she had no shortness of breath and was doing well at time of discharge.  She notes that she was in her normal state of health with her chronic baseline dyspnea until last night.  She reports that she laid down to rest and had worsening of her dyspnea.  She reports trying to use her albuterol inhaler without any improvement.  She reports to me that she has been told to call EMS if she has any issues with her shortness of breath.  She denies any chest pain, palpitations, fevers, chills, cough, lower extremity swelling, PND.  It is unclear if she has orthopnea as she vacillates  with her answers regarding this question.  ED course: She had normal vital signs upon arrival with no tachycardia, tachypnea, fever, hypoxia.  Her lab work is mostly unremarkable and only notable for a mildly elevated creatinine from baseline at 1.2 from her baseline around 1.1.  BNP in the emergency department was 342 which is stable from her previous hospitalization in January although mildly elevated from her prior baseline down to 250.  Troponin negative.  EKG unremarkable.  Chest x-ray with cardiomegaly and mild vascular congestion.  Meds:  Current Meds  Medication Sig  . acetaminophen (TYLENOL) 325 MG tablet Take 650 mg by mouth every 6 (six) hours as needed for mild pain, moderate pain or headache.   . albuterol (PROVENTIL HFA;VENTOLIN HFA) 108 (90 Base) MCG/ACT inhaler Inhale 1-2 puffs into the lungs every 6 (six) hours as needed for wheezing or shortness of breath.  Marland Kitchen aspirin EC 81 MG tablet Take 81 mg by mouth daily.  Marland Kitchen atorvastatin (LIPITOR) 40 MG tablet Take 1 tablet (40 mg total) by mouth daily at 6 PM.  . bisoprolol (ZEBETA) 5 MG tablet Take 0.5 tablets (2.5 mg total) by mouth daily.  . Emollient (LUBRIDERM DAILY MOISTURE) LOTN Apply 1 application topically 2 (two) times daily. Apply topically to dry exfoliative areas of legs  . furosemide (LASIX) 20 MG tablet Take 1 tablet (20  mg total) by mouth daily.  . Hypromellose (NATURAL BALANCE TEARS OP) Apply 1 drop to eye 3 (three) times daily.  . isosorbide mononitrate (IMDUR) 30 MG 24 hr tablet Take 1 tablet (30 mg total) by mouth daily.  Marland Kitchen losartan (COZAAR) 100 MG tablet Take 1 tablet (100 mg total) by mouth daily.  . meloxicam (MOBIC) 7.5 MG tablet Take 1 tablet (7.5 mg total) by mouth daily.  Marland Kitchen oxyCODONE (OXY IR/ROXICODONE) 5 MG immediate release tablet Take 1 tablet (5 mg total) by mouth every 6 (six) hours as needed for severe pain.     Allergies: Allergies as of 09/11/2017  . (No Known Allergies)   Past Medical History:    Diagnosis Date  . Anemia   . Aortic insufficiency    mild  . Arthritis    "arms" (08/09/2015)  . CAD (coronary artery disease)    cath 08/09/2014 95% stenosis in prox to mid RCA s/p DES, 80-90% prox OM2, 50% distal LAD  . CHF (congestive heart failure) ([place])   . CKD (chronic kidney disease) stage 3, GFR 30-59 ml/min (HCC)   . Colon polyp    2009 colonoscopy, not retrieved for pathology  . Ectropion of left lower eyelid   . GERD (gastroesophageal reflux disease) 2009   EGD with benign gastric polyp too  . Hyperlipidemia   . Hypertension   . Stroke ([place]) 1975  . Vitamin D deficiency     Family History:  Family History  Problem Relation Age of Onset  . Stroke Mother   . Hypertension Mother   . Stroke Father   . Hypertension Father   . Diabetes Sister    Social History: Social History   Socioeconomic History  . Marital status: Widowed    Spouse name: None  . Number of children: None  . Years of education: None  . Highest education level: None  Social Needs  . Financial resource strain: None  . Food insecurity - worry: None  . Food insecurity - inability: None  . Transportation needs - medical: None  . Transportation needs - non-medical: None  Occupational History  . None  Tobacco Use  . Smoking status: Former Smoker    Types: Cigarettes    Last attempt to quit: 07/02/1979    Years since quitting: 38.2  . Smokeless tobacco: Former Systems developer  . Tobacco comment: Started in teenage years - Quit 1980  Substance and Sexual Activity  . Alcohol use: No    Alcohol/week: 0.0 oz    Comment: Quit alcohol 1975-76  . Drug use: No  . Sexual activity: No  Other Topics Concern  . None  Social History Narrative   Ms. Wuest lives alone in an apartment in [place]. Her nephew, and her son and his wife live nearby and visit and help often. She is independent in ADLs but depends on family for IADLs. She is ambulatory in her apartment without a walker but requires this when she leaves  her apartment.     Review of Systems: A complete ROS was negative except as per HPI.   Physical Exam: Blood pressure (!) 159/57, pulse 78, temperature 98.1 F (36.7 C), temperature source Oral, resp. rate (!) 24, height 5\' 4"  (1.626 m), weight 159 lb (72.1 kg), SpO2 96 %. General: alert, well-developed, and cooperative to examination.  Head: normocephalic and atraumatic.  Eyes: vision grossly intact, pupils equal, pupils round, pupils reactive to light, no injection and anicteric.  Mouth: pharynx pink and moist, no erythema, and  no exudates.  Neck: supple, full ROM, no thyromegaly, no JVD, and no carotid bruits.  Lungs: normal respiratory effort, no accessory muscle use, normal breath sounds, no crackles, and no wheezes. Heart: normal rate, regular rhythm, no murmur, no gallop, and no rub.  Abdomen: soft, non-tender, normal bowel sounds, no distention, no guarding, no rebound tenderness Msk: no joint swelling, no joint warmth, and no redness over joints.  Pulses: 1+ DP/PT pulses bilaterally Extremities: No cyanosis, clubbing, edema Neurologic: alert & oriented X3, no focal deficits Skin: turgor normal and no rashes.  Psych: Normal mood and affect  EKG: personally reviewed my interpretation is normal sinus rhythm no change from prior  CXR: personally reviewed my interpretation is no acute cardiopulmonary process  Assessment & Plan by Problem:  Shortness of breath: Jane [name] is a pleasant 84 year old female with grade 1 diastolic heart failure who has issues with chronic dyspnea.  She experiences frequent intermittent episodes of dyspnea on exertion at home with frequent ED visits for this.  She has no acute changes today and has no signs of volume overload but has clear lungs to auscultation, no JVD, no peripheral edema.  Her BNP appears at her baseline.  Her weight is down 3 pounds from her previous.  No signs of infection and she denies any cough.  Chest x-ray only notable for mild  vascular congestion.  Her vital signs are stable and she is maintaining sats well on room air.  She does have a prescription for an albuterol inhaler though it is unclear why she has this prescription.  She reports using her inhaler 1-2 times daily.  However, she is unable to demonstrate proper inhaler technique.  No wheezing appreciated on exam and no clear history of asthma or COPD.  This appears to be a chronic long-standing problem for the patient most likely related to her chronic diastolic heart failure with possible underlying deconditioning.  She received 1 dose of IV Lasix in the emergency department.  Will follow her I's and O's, daily weights, renal function.  Continue her home Lasix 20 mg daily tomorrow, bisoprolol 2.5 mg daily, indoor 30 mg daily.  We will hold her home losartan given her mild bump in creatinine and reassess renal function tomorrow.  Will consult PT/OT as patient has deconditioning.  Unfortunately she lives alone in her apartment and reports she has little social support.  She reports that if she needs to go anywhere she has to pay someone to take her.  Will consult case management for home health needs.  CKD stage III: Creatinine today is 1.2.  It is difficult to tell what her baseline is that she has fluctuated greatly but appears to be around 1-1.1.  We will hold her home losartan as above and reassess renal function in the morning.  Hyperlipidemia: New home atorvastatin 40 mg daily  CAD status post PCI with DES in 2016: Continue home aspirin 81 mg daily, atorvastatin 40 mg daily, bisoprolol 2.5 mg daily, indoor 30 mg daily.  Will hold losartan as above.  Hypertension: Continue home for bisoprolol 2.5 mg daily and Imdur 30 mg daily.  Holding home losartan.  DVT prophylaxis: Lovenox  Dispo: Admit patient to Observation with expected length of stay less than 2 midnights.  Signed: Maryellen Pile, MD 09/11/2017, 8:54 AM  Pager: [phone]

## 2017-09-12 DIAGNOSIS — Z87891 Personal history of nicotine dependence: Secondary | ICD-10-CM | POA: Diagnosis not present

## 2017-09-12 DIAGNOSIS — I2510 Atherosclerotic heart disease of native coronary artery without angina pectoris: Secondary | ICD-10-CM | POA: Diagnosis not present

## 2017-09-12 DIAGNOSIS — H02105 Unspecified ectropion of left lower eyelid: Secondary | ICD-10-CM | POA: Diagnosis present

## 2017-09-12 DIAGNOSIS — D631 Anemia in chronic kidney disease: Secondary | ICD-10-CM | POA: Diagnosis present

## 2017-09-12 DIAGNOSIS — I129 Hypertensive chronic kidney disease with stage 1 through stage 4 chronic kidney disease, or unspecified chronic kidney disease: Secondary | ICD-10-CM | POA: Diagnosis not present

## 2017-09-12 DIAGNOSIS — Z8673 Personal history of transient ischemic attack (TIA), and cerebral infarction without residual deficits: Secondary | ICD-10-CM | POA: Diagnosis not present

## 2017-09-12 DIAGNOSIS — E785 Hyperlipidemia, unspecified: Secondary | ICD-10-CM

## 2017-09-12 DIAGNOSIS — I130 Hypertensive heart and chronic kidney disease with heart failure and stage 1 through stage 4 chronic kidney disease, or unspecified chronic kidney disease: Secondary | ICD-10-CM | POA: Diagnosis present

## 2017-09-12 DIAGNOSIS — Z79899 Other long term (current) drug therapy: Secondary | ICD-10-CM

## 2017-09-12 DIAGNOSIS — R0602 Shortness of breath: Secondary | ICD-10-CM

## 2017-09-12 DIAGNOSIS — E861 Hypovolemia: Secondary | ICD-10-CM | POA: Diagnosis not present

## 2017-09-12 DIAGNOSIS — Z7982 Long term (current) use of aspirin: Secondary | ICD-10-CM | POA: Diagnosis not present

## 2017-09-12 DIAGNOSIS — N183 Chronic kidney disease, stage 3 (moderate): Secondary | ICD-10-CM

## 2017-09-12 DIAGNOSIS — Z955 Presence of coronary angioplasty implant and graft: Secondary | ICD-10-CM | POA: Diagnosis not present

## 2017-09-12 DIAGNOSIS — N179 Acute kidney failure, unspecified: Secondary | ICD-10-CM | POA: Diagnosis not present

## 2017-09-12 DIAGNOSIS — I5033 Acute on chronic diastolic (congestive) heart failure: Secondary | ICD-10-CM | POA: Diagnosis present

## 2017-09-12 DIAGNOSIS — I739 Peripheral vascular disease, unspecified: Secondary | ICD-10-CM | POA: Diagnosis present

## 2017-09-12 DIAGNOSIS — K219 Gastro-esophageal reflux disease without esophagitis: Secondary | ICD-10-CM | POA: Diagnosis present

## 2017-09-12 DIAGNOSIS — Z8249 Family history of ischemic heart disease and other diseases of the circulatory system: Secondary | ICD-10-CM | POA: Diagnosis not present

## 2017-09-12 DIAGNOSIS — Z86010 Personal history of colonic polyps: Secondary | ICD-10-CM | POA: Diagnosis not present

## 2017-09-12 LAB — BASIC METABOLIC PANEL
ANION GAP: 9 (ref 5–15)
BUN: 32 mg/dL — ABNORMAL HIGH (ref 6–20)
CO2: 18 mmol/L — ABNORMAL LOW (ref 22–32)
Calcium: 9.1 mg/dL (ref 8.9–10.3)
Chloride: 108 mmol/L (ref 101–111)
Creatinine, Ser: 1.59 mg/dL — ABNORMAL HIGH (ref 0.44–1.00)
GFR, EST AFRICAN AMERICAN: 33 mL/min — AB (ref >60)
GFR, EST NON AFRICAN AMERICAN: 28 mL/min — AB (ref >60)
Glucose, Bld: 101 mg/dL — ABNORMAL HIGH (ref 65–99)
POTASSIUM: 3.9 mmol/L (ref 3.5–5.1)
SODIUM: 135 mmol/L (ref 135–145)

## 2017-09-12 MED ORDER — ENOXAPARIN SODIUM 30 MG/0.3ML ~~LOC~~ SOLN
30.0000 mg | Freq: Every day | SUBCUTANEOUS | Status: DC
  Administered 2017-09-13: 30 mg via SUBCUTANEOUS
  Filled 2017-09-12: qty 0.3

## 2017-09-12 MED ORDER — SODIUM CHLORIDE 0.9 % IV SOLN
Freq: Once | INTRAVENOUS | Status: AC
  Administered 2017-09-12: 08:00:00 via INTRAVENOUS

## 2017-09-12 NOTE — Care Management Obs Status (Deleted)
[facility] NOTIFICATION   Patient Details  Name: Jane [name] MRN: [id number] Date of Birth: 03-30-1929   Medicare Observation Status Notification Given:  Yes    Kristen Cardinal, RN 09/12/2017, 12:39 PM

## 2017-09-12 NOTE — Progress Notes (Signed)
CSW received request from MD to provide patient with information on ALF. CSW cannot support with new ALF placement from hospital. CSW did meet with patient at bedside and provided list of ALFs for patient and family to follow up on.  Jane [name], [place]

## 2017-09-12 NOTE — Evaluation (Signed)
Occupational Therapy Evaluation Patient Details Name: Jane [name] MRN: [id number] DOB: 25-Aug-1928 Today's Date: 09/12/2017    History of Present Illness 84 year old female who presents SOB. Pt d/c from rehab at [facility] 08/27/17 after hospitalization for flu. PMH:  CKD, HF, CVA   Clinical Impression   Pt lives alone and is independent in self care and light housekeeping and meal prep. She furniture walks in her home and uses a RW outside of her home. Her family assists with grocery shopping and transportation. Pt presents with impaired vision, decreased standing balance and generalized weakness. Recommending home with [facility]. Will follow.    Follow Up Recommendations  Home health OT;Supervision/Assistance - 24 hour([facility])    Equipment Recommendations  3 in 1 bedside commode    Recommendations for Other Services       Precautions / Restrictions Precautions Precautions: Fall Restrictions Weight Bearing Restrictions: No      Mobility Bed Mobility Overal bed mobility: Modified Independent             General bed mobility comments: increased time and HOB up  Transfers Overall transfer level: Needs assistance Equipment used: Rolling walker (2 wheeled) Transfers: Sit to/from Stand Sit to Stand: Min assist;Min guard         General transfer comment: min guard from bed, min from toilet    Balance Overall balance assessment: Needs assistance   Sitting balance-Leahy Scale: Good       Standing balance-Leahy Scale: Poor                             ADL either performed or assessed with clinical judgement   ADL Overall ADL's : Needs assistance/impaired Eating/Feeding: Independent;Sitting   Grooming: Wash/dry hands;Wash/dry face;Sitting;Set up   Upper Body Bathing: Supervision/ safety;Set up;Sitting   Lower Body Bathing: Min guard;Sit to/from stand   Upper Body Dressing : Set up;Sitting   Lower Body Dressing: Min guard;Sit to/from stand   Toilet  Transfer: Minimal assistance;Ambulation;RW   Toileting- Clothing Manipulation and Hygiene: Minimal assistance;Sit to/from stand       Functional mobility during ADLs: Rolling walker;Minimal assistance;Cueing for safety       Vision Baseline Vision/History: Wears glasses Patient Visual Report: No change from baseline Additional Comments: pt with poor visual acuity     Perception     Praxis      Pertinent Vitals/Pain Pain Assessment: No/denies pain     Hand Dominance Right   Extremity/Trunk Assessment Upper Extremity Assessment Upper Extremity Assessment: Overall WFL for tasks assessed   Lower Extremity Assessment Lower Extremity Assessment: Defer to PT evaluation   Cervical / Trunk Assessment Cervical / Trunk Assessment: Kyphotic   Communication Communication Communication: HOH   Cognition Arousal/Alertness: Awake/alert Behavior During Therapy: WFL for tasks assessed/performed Overall Cognitive Status: Within Functional Limits for tasks assessed                                     General Comments       Exercises     Shoulder Instructions      Home Living Family/patient expects to be discharged to:: Private residence Living Arrangements: Alone Available Help at Discharge: Family;Friend(s);Available PRN/intermittently Type of Home: Apartment Home Access: Elevator     Home Layout: One level     Bathroom Shower/Tub: Teacher, early years/pre: Standard     Home Equipment:  Walker - 2 wheels;Tub bench;Grab bars - tub/shower;Grab bars - toilet   Additional Comments: pt reports that her nephew gets food for her  "when he can" because she does not drive but she does her own cooking and cleaning; reports she has a son but he is "not well"      Prior Functioning/Environment Level of Independence: Needs assistance  Gait / Transfers Assistance Needed: Furniture walks in apartment, uses RW for community mobility ADL's / Homemaking  Assistance Needed: sponge bathes, makes light meals and performs light housekeeping            OT Problem List: Impaired balance (sitting and/or standing)      OT Treatment/Interventions: Self-care/ADL training    OT Goals(Current goals can be found in the care plan section) Acute Rehab OT Goals Patient Stated Goal: to return home when she is ready OT Goal Formulation: With patient Time For Goal Achievement: 09/26/17 Potential to Achieve Goals: Good ADL Goals Pt Will Perform Grooming: with supervision;standing Pt Will Perform Lower Body Bathing: with supervision;sit to/from stand Pt Will Perform Lower Body Dressing: with supervision;sit to/from stand Pt Will Transfer to Toilet: with supervision;ambulating;bedside commode(over toilet) Pt Will Perform Toileting - Clothing Manipulation and hygiene: with supervision;sit to/from stand Additional ADL Goal #1: Pt will gather items necessary for ADL around her room with supervision.  OT Frequency: Min 2X/week   Barriers to D/C: Decreased caregiver support          Co-evaluation              AM-PAC PT "6 Clicks" Daily Activity     Outcome Measure Help from another person eating meals?: None Help from another person taking care of personal grooming?: A Little Help from another person toileting, which includes using toliet, bedpan, or urinal?: A Little Help from another person bathing (including washing, rinsing, drying)?: A Little Help from another person to put on and taking off regular upper body clothing?: None Help from another person to put on and taking off regular lower body clothing?: A Little 6 Click Score: 20   End of Session Equipment Utilized During Treatment: Gait belt;Rolling walker Nurse Communication: (case manager about home first)  Activity Tolerance: Patient tolerated treatment well Patient left: in chair;with call bell/phone within reach;with chair alarm set  OT Visit Diagnosis: Unsteadiness on feet  (R26.81);Low vision, both eyes (H54.2)                Time: 7628-3151 OT Time Calculation (min): 41 min Charges:  OT General Charges $OT Visit: 1 Visit OT Evaluation $OT Eval Moderate Complexity: 1 Mod OT Treatments $Self Care/Home Management : 23-37 mins G-Codes:     09/19/17 Nestor Lewandowsky, OTR/L Pager: [phone]  Werner Lean, Haze Boyden 09-19-2017, 11:45 AM

## 2017-09-12 NOTE — Evaluation (Signed)
Physical Therapy Evaluation Patient Details Name: Jane [name] MRN: [id number] DOB: [birth year] Today's Date: 09/12/2017   History of Present Illness  84 year old female who presents SOB. Pt d/c from rehab at [facility] 08/27/17 after hospitalization for flu. PMH:  CKD, HF, CVA  Clinical Impression  Patient presents with decreased independence and safety with mobility due to weakness, poor activity tolerance and imbalance.  She will benefit from skilled PT in the acute setting to allow return home with maximized [facility] services support (good candidate for [facility], though CM reports they cannot go into her home.)  Will need to consider ALF due to limited support and concern for cognitive ability to care for herself safely.      Follow Up Recommendations Home health PT;Supervision/Assistance - 24 hour    Equipment Recommendations  None recommended by PT    Recommendations for Other Services       Precautions / Restrictions Precautions Precautions: Fall      Mobility  Bed Mobility               General bed mobility comments: up in chair  Transfers Overall transfer level: Needs assistance Equipment used: Rolling walker (2 wheeled) Transfers: Sit to/from Stand Sit to Stand: Min assist         General transfer comment: assist for balance up from chair  Ambulation/Gait Ambulation/Gait assistance: Min assist;Min guard Ambulation Distance (Feet): 130 Feet Assistive device: Rolling walker (2 wheeled) Gait Pattern/deviations: Step-through pattern;Step-to pattern;Shuffle;Trunk flexed;Decreased stride length     General Gait Details: increased time to ambulate in hallway, unsteady with LOB x 2 min A to recover, pt reached for wall rail to prevent fall for one episode  Stairs            Wheelchair Mobility    Modified Rankin (Stroke Patients Only)       Balance Overall balance assessment: Needs assistance   Sitting balance-Leahy Scale: Good     Standing balance  support: Bilateral upper extremity supported Standing balance-Leahy Scale: Poor Standing balance comment: intermittent UE support in standing still leaning back with attempts to don briefs with mod A needed                             Pertinent Vitals/Pain Pain Assessment: No/denies pain    Home Living Family/patient expects to be discharged to:: Private residence Living Arrangements: Alone Available Help at Discharge: Family;Friend(s);Available PRN/intermittently Type of Home: Apartment Home Access: Elevator     Home Layout: One level Home Equipment: Walker - 2 wheels;Tub bench;Grab bars - tub/shower;Grab bars - toilet Additional Comments: pt reports that her nephew gets food for her  "when he can" because she does not drive but she does her own cooking and cleaning; reports she has a son but he is "not well"    Prior Function Level of Independence: Needs assistance   Gait / Transfers Assistance Needed: Furniture walks in apartment, uses RW for community mobility  ADL's / Homemaking Assistance Needed: sponge bathes, makes light meals and performs light housekeeping        Hand Dominance   Dominant Hand: Right    Extremity/Trunk Assessment   Upper Extremity Assessment Upper Extremity Assessment: Defer to OT evaluation    Lower Extremity Assessment Lower Extremity Assessment: RLE deficits/detail;LLE deficits/detail RLE Deficits / Details: AROM WFL except knee flexion limited to about 85, strength hip flexion 3+/5, knee extension 4/5 RLE Sensation: decreased light touch LLE Deficits /  Details: AROM WFL except knee flexion limited to about 85, strength hip flexion 3+/5, knee extension 4/5    Cervical / Trunk Assessment Cervical / Trunk Assessment: Kyphotic  Communication   Communication: HOH  Cognition Arousal/Alertness: Awake/alert Behavior During Therapy: WFL for tasks assessed/performed Overall Cognitive Status: Within Functional Limits for tasks  assessed(slow processing)                                        General Comments      Exercises     Assessment/Plan    PT Assessment Patient needs continued PT services  PT Problem List Decreased strength;Decreased mobility;Decreased activity tolerance;Decreased balance;Decreased knowledge of use of DME;Decreased safety awareness       PT Treatment Interventions DME instruction;Therapeutic activities;Gait training;Therapeutic exercise;Patient/family education;Balance training;Functional mobility training    PT Goals (Current goals can be found in the Care Plan section)  Acute Rehab PT Goals Patient Stated Goal: to return home when she is ready PT Goal Formulation: With patient Time For Goal Achievement: 09/26/17 Potential to Achieve Goals: Good    Frequency Min 3X/week   Barriers to discharge        Co-evaluation               AM-PAC PT "6 Clicks" Daily Activity  Outcome Measure Difficulty turning over in bed (including adjusting bedclothes, sheets and blankets)?: A Little Difficulty moving from lying on back to sitting on the side of the bed? : A Lot Difficulty sitting down on and standing up from a chair with arms (e.g., wheelchair, bedside commode, etc,.)?: Unable Help needed moving to and from a bed to chair (including a wheelchair)?: A Little Help needed walking in hospital room?: A Little Help needed climbing 3-5 steps with a railing? : A Lot 6 Click Score: 14    End of Session Equipment Utilized During Treatment: Gait belt Activity Tolerance: Patient limited by fatigue Patient left: with chair alarm set;with call bell/phone within reach;in chair   PT Visit Diagnosis: Other abnormalities of gait and mobility (R26.89);Unsteadiness on feet (R26.81)    Time: 4034-7425 PT Time Calculation (min) (ACUTE ONLY): 38 min   Charges:   PT Evaluation $PT Eval Moderate Complexity: 1 Mod PT Treatments $Gait Training: 8-22 mins $Therapeutic  Activity: 8-22 mins   PT G Codes:        09/23/17  Magda Kiel, PT [phone] 2017-09-23  Reginia Naas 09/23/17, 3:21 PM

## 2017-09-12 NOTE — Care Management Note (Addendum)
Case Management Note  Patient Details  Name: Jane [name] MRN: [id number] Date of Birth: 1928-08-05  Subjective/Objective:   History of CHF. Pt d/c from rehab at [facility] 08/27/17 after hospitalization for flu.  Admitted with CKD stage III       Action/Plan: Prior to admission patient lived at home alone.  Her family assists with grocery shopping and transportation.  Home DME: Walker - 2 wheels;Tub bench;Grab bars - tub/shower;Grab bars - toilet. Patient makes light meals and performs light housekeeping. Patient reports that her nephew gets food for her  "when he can" because she does not drive but she does her own cooking.  Patient reports her son is disabled but his wife takes her to medical appointments when she can; busy with her school age children.  NCM will continue to follow for discharge transition needs.  Discussed recommendation of [facility] with patient and she wants to think about it.  Patient is concern about strangers coming in her home and wants to remain independent as long as possible.  Educated patient on the benefits of therapy and advise I will check with her later to see what she has decided.    Expected Discharge Date:   To be determined.            Expected Discharge Plan:  Home/Self Care  In-House Referral:  PT OT Discharge planning Services  CM Consult  Status of Service:  In process, will continue to follow  Additional Comments: OT recommended HH  OT Supervision/Assistance - 24 hour(Home First); Not appropriate for Home First program at this time, lives in [place].  Received notification from [facility] that patient is currently active for RN and OT home health.  But refuses PT.   07/15/17 3:57PM spoke with attending Dr. Isac Sarna advised [facility] notification of active OT/RN services and that representative states patient gets confused at times.    Kristen Cardinal, RN  Nurse case manager [place] [phone] 09/12/2017, 12:14 PM

## 2017-09-12 NOTE — Care Management Obs Status (Signed)
[place] NOTIFICATION   Patient Details  Name: Jane [name] MRN: [id number] Date of Birth: 17-Jul-1928   Medicare Observation Status Notification Given:  Yes  Kristen Cardinal, RN 09/12/2017, 12:38 PM

## 2017-09-12 NOTE — Progress Notes (Addendum)
In to speak with patient discussed recommendations for Home Health PT/OT/Aide/Social worker to assist with Assisted living placement.  Offered choice, patient selected [facility]. Referral for PT/OT/Nurse aide/social worker called to [employer].  Kristen Cardinal Nurse Case Manager [place] [phone]

## 2017-09-12 NOTE — Progress Notes (Signed)
   Subjective:  No acute events overnight. Patient continues to do well. Denies chest pain and shortness of breath. Does not voice any complaints today. Discussed with patient will plan for PT evaluation and help to arrange support at home.    Objective:  Vital signs in last 24 hours: Vitals:   09/11/17 1641 09/11/17 2049 09/12/17 0558 09/12/17 0847  BP: (!) 123/54 (!) 125/57 (!) 116/47 (!) 122/52  Pulse: 84 82 62 64  Resp: 17 18 18 17   Temp: 98.5 F (36.9 C) 98.1 F (36.7 C) 98.2 F (36.8 C) 98.3 F (36.8 C)  TempSrc: Oral Oral Oral Oral  SpO2: 98% 100% 98% 99%  Weight:  154 lb 12.2 oz (70.2 kg)    Height:       Physical Exam  Constitutional: She is oriented to person, place, and time. She appears well-developed and well-nourished. No distress.  Cardiovascular: Normal rate, regular rhythm and normal heart sounds. Exam reveals no gallop and no friction rub.  No murmur heard. Pulmonary/Chest: Effort normal and breath sounds normal. No respiratory distress. She has no wheezes. She has no rales.  Abdominal: Soft. Bowel sounds are normal. She exhibits no distension. There is no tenderness.  Musculoskeletal: She exhibits no edema.  Neurological: She is alert and oriented to person, place, and time.    Assessment/Plan:  Principal Problem:   Shortness of breath Active Problems:   Hyperlipidemia   PAD (peripheral artery disease) (HCC)   Ectropion of left lower eyelid   Physical deconditioning   CKD (chronic kidney disease)  # Shortness of breath: Patient has a history of chronic dyspnea on exertion of unknown etiology.  Has an albuterol inhaler at home that she is unsure on how to use appropriately.  She is doing well today and lung exam unremarkable.  Appears comfortable on room air and no documented hypoxia thus far.  Concern for lack of support at home.  Order PT and OT evaluation.  - PT evaluation  - CM consult for [facility] needs - SW consult for PACE program   # CKD stage III:  Cr 1.2.  It is difficult to tell what her baseline is that she has fluctuated greatly but appears to be around 1-1.1.  Renal function worsened after one dose of IV Lasix 20 mg in the ED.  IVF as below.  Unfortunately, patient received another dose of IV Lasix 20 mg this morning.  We will continue to monitor renal function. - 1L NS bolus  - BMP in AM  # Hyperlipidemia: - Continue home atorvastatin 40 mg daily  # CAD status post PCI with DES in 2016: - Continue home aspirin 81 mg daily, atorvastatin 40 mg daily, bisoprolol 2.5 mg daily, indoor 30 mg daily - Will hold losartan as above  # Hypertension: - Continue home for bisoprolol 2.5 mg daily and Imdur 30 mg daily - Holding home losartan.   Dispo: Anticipated discharge in approximately 1-2 day(s).   Welford Roche, MD 09/12/2017, 12:46 PM Pager: [phone]

## 2017-09-13 LAB — BASIC METABOLIC PANEL
Anion gap: 6 (ref 5–15)
BUN: 30 mg/dL — ABNORMAL HIGH (ref 6–20)
CHLORIDE: 112 mmol/L — AB (ref 101–111)
CO2: 20 mmol/L — AB (ref 22–32)
Calcium: 8.6 mg/dL — ABNORMAL LOW (ref 8.9–10.3)
Creatinine, Ser: 1.28 mg/dL — ABNORMAL HIGH (ref 0.44–1.00)
GFR calc non Af Amer: 37 mL/min — ABNORMAL LOW (ref >60)
GFR, EST AFRICAN AMERICAN: 43 mL/min — AB (ref >60)
GLUCOSE: 96 mg/dL (ref 65–99)
Potassium: 3.9 mmol/L (ref 3.5–5.1)
Sodium: 138 mmol/L (ref 135–145)

## 2017-09-13 MED ORDER — FUROSEMIDE 20 MG PO TABS: 20.0000 mg | ORAL_TABLET | Freq: Every day | ORAL | 0 refills | Status: DC

## 2017-09-13 MED ORDER — RAMELTEON 8 MG PO TABS
8.0000 mg | ORAL_TABLET | Freq: Every day | ORAL | Status: DC
  Administered 2017-09-13: 8 mg via ORAL
  Filled 2017-09-13: qty 1

## 2017-09-13 NOTE — Progress Notes (Signed)
   Subjective:  Patient denies shortness of breath with ambulation to restroom this morning. She denies chest pain, swelling.   Objective:  Vital signs in last 24 hours: Vitals:   09/12/17 1657 09/12/17 2047 09/13/17 0115 09/13/17 0551  BP: (!) 127/58 (!) 110/40  (!) 131/45  Pulse: 66 63  66  Resp: 18 18  18   Temp: 98.4 F (36.9 C) 98.4 F (36.9 C)  98 F (36.7 C)  TempSrc: Oral Oral  Oral  SpO2: 98% 98%  99%  Weight:  154 lb 12.2 oz (70.2 kg) 154 lb 12.2 oz (70.2 kg)   Height:       Constitutional: NAD, eating breakfast CV: RRR, no M/R/G Resp: CTAB, no increased work of breathing Abd: soft, NDNT Ext: no edema  Assessment/Plan:  Principal Problem:   Shortness of breath Active Problems:   Hyperlipidemia   PAD (peripheral artery disease) (HCC)   Ectropion of left lower eyelid   Physical deconditioning   CKD (chronic kidney disease)  Shortness of breath: Patient clinically stable and euvolemic on exam. --discharge today with f/u in [facility] clinic this week --will hold lasix for today and restart tomorrow in setting of hypovolemic AKI  Acute on CKD, stage III: Cr back down to 1.2 this morning, baseline is 1. --restart home lasix 20mg  daily tomorrow --f/u in [facility] this week  CAD s/p PCI with DES 2016: Continue home asa 81mg  daily, atorva 40mg  daily, bisoprolol 2.5mg  daily and imdur 30mg  daily. --hold home losartan, until follow up in clinic as BPs stable and recent AKI  HTN: BP stable here. --continue bisoprolol 2.5mg  daily. --hold home losartan until f/u in clinic due AKI  Patient was given ALF resource paperwork yesterday by SW. Will encourage continued conversation outpatient as patient may need more help at home than she lets on. She is already getting HHPT/OT; I have ordered HHSW.  Dispo: Anticipated discharge today.   Alphonzo Grieve, MD 09/13/2017, 9:31 AM Pager [phone]

## 2017-09-13 NOTE — Discharge Summary (Signed)
Name: Jane [name] MRN: [id number] DOB: [birth year] 84 y.o. PCP: Colbert Ewing, MD  Date of Admission: 09/11/2017  3:00 AM Date of Discharge: 09/13/2017 Attending Physician: Dr. Dareen Piano  Discharge Diagnosis: 1. Shortness of breath Principal Problem:   Shortness of breath Active Problems:   Hyperlipidemia   PAD (peripheral artery disease) (HCC)   Ectropion of left lower eyelid   Physical deconditioning   CKD (chronic kidney disease)  Discharge Medications: Allergies as of 09/13/2017   No Known Allergies     Medication List    STOP taking these medications   losartan 100 MG tablet Commonly known as:  COZAAR   meloxicam 7.5 MG tablet Commonly known as:  MOBIC   oxyCODONE 5 MG immediate release tablet Commonly known as:  Oxy IR/ROXICODONE     TAKE these medications   acetaminophen 325 MG tablet Commonly known as:  TYLENOL Take 650 mg by mouth every 6 (six) hours as needed for mild pain, moderate pain or headache.   albuterol 108 (90 Base) MCG/ACT inhaler Commonly known as:  PROVENTIL HFA;VENTOLIN HFA Inhale 1-2 puffs into the lungs every 6 (six) hours as needed for wheezing or shortness of breath.   aspirin EC 81 MG tablet Take 81 mg by mouth daily.   atorvastatin 40 MG tablet Commonly known as:  LIPITOR Take 1 tablet (40 mg total) by mouth daily at 6 PM.   bisoprolol 5 MG tablet Commonly known as:  ZEBETA Take 0.5 tablets (2.5 mg total) by mouth daily.   furosemide 20 MG tablet Commonly known as:  LASIX Take 1 tablet (20 mg total) by mouth daily. Start taking this again tomorrow. What changed:  additional instructions   isosorbide mononitrate 30 MG 24 hr tablet Commonly known as:  IMDUR Take 1 tablet (30 mg total) by mouth daily.   LUBRIDERM DAILY MOISTURE Lotn Apply 1 application topically 2 (two) times daily. Apply topically to dry exfoliative areas of legs   NATURAL BALANCE TEARS OP Apply 1 drop to eye 3 (three) times daily.       Disposition and follow-up:   [name] was discharged from [facility] in Stable condition.  At the hospital follow up visit please address:  1.   Shortness of breath Deconditioning: Please assess patient's symptoms and volume status HH PT/OT ordered at discharge for deconditioning Continue addressing [facility] placement vs PACE  AKI: Due to overdiuresis Patient asked to resume home lasix 20mg  daily day after discharge Held losartan until f/u in clinic - assess for safe restart   2.  Labs / imaging needed at time of follow-up: Bmet  3.  Pending labs/ test needing follow-up: n/a  Follow-up Appointments: Follow-up Information    Colbert Ewing, MD. Schedule an appointment as soon as possible for a visit in 1 week(s).   Specialty:  Internal Medicine Contact information: [place] [address] [phone]        [place], [place] Follow up.   Specialty:  Home Health Services Why:  Will call you to set-up visit Contact information: [place] [address] [phone]          Hospital Course by problem list: Principal Problem:   Shortness of breath Active Problems:   Hyperlipidemia   PAD (peripheral artery disease) (HCC)   Ectropion of left lower eyelid   Physical deconditioning   CKD (chronic kidney disease)   Shortness of breath Deconditioning: Patient seen  in ED for evaluation of shortness of breath; her BNP was elevated on evaluation to level of prior HF exacerbation so she was given 1 dose of IV lasix. I stat troponin was negative and EKG was nonischemic. On admission by IMTS patient was asymptomatic, hemodynamically stable, and euvolemic. Patient with underlying dementia and unable to provide clear history, however was able to further clarify after several attempts that her shortness of breath is mainly with activity but denied chest pain, dizziness, or other  signs/symptoms of volume overload. She lives by herself with some assistance from daughter in law, however per patient's report (though inconsistent due to underlying dementia) she still needs significant assistance at home. PT/OT evaluated patient and recommended HHPT/OT which were ordered at discharge. SW was consulted and provided with resources for Assisted Living Facilities.   AKI on CKD, stage 3: Due to diuresis, patient had AKI from b/l of ~1-1.2 to 1.6; which resolved after gentle IVF hydration and holding further diuresis while inpatient. At discharge, Cr was 1.28. At f/u consider repeating Bmet to evaluate renal function after evaluating volume status. She was asked to resume her home lasix 20mg  daily day after discharge, and asked to hold her losartan until f/u in clinic.  CAD s/p PCI with DES 2016 HTN: Patient was continued on home asa 81mg  daily, atorvastatin 40mg  daily, bisoprolol 2.5mg  daily and imdur 30mg  daily. Her losartan was held due to AKI; please assess for safe resumption of losartan.  Discharge Vitals:   BP (!) 127/48 (BP Location: Left Arm)   Pulse 86   Temp 98.4 F (36.9 C) (Oral)   Resp 18   Ht 5\' 4"  (1.626 m)   Wt 154 lb 12.2 oz (70.2 kg)   SpO2 98%   BMI 26.57 kg/m   Pertinent Labs, Studies, and Procedures:  CBC Latest Ref Rng & Units 09/11/2017 07/23/2017 07/22/2017  WBC 4.0 - 10.5 K/uL 3.8(L) 2.3(L) 3.1(L)  Hemoglobin 12.0 - 15.0 g/dL 10.7(L) 9.7(L) 10.3(L)  Hematocrit 36.0 - 46.0 % 31.9(L) 28.7(L) 30.9(L)  Platelets 150 - 400 K/uL 111(L) 83(L) 91(L)   BMP Latest Ref Rng & Units 09/13/2017 09/12/2017 09/11/2017  Glucose 65 - 99 mg/dL 96 101(H) 74  BUN 6 - 20 mg/dL 30(H) 32(H) 27(H)  Creatinine 0.44 - 1.00 mg/dL 1.28(H) 1.59(H) 1.20(H)  BUN/Creat Ratio 12 - 28 - - -  Sodium 135 - 145 mmol/L 138 135 141  Potassium 3.5 - 5.1 mmol/L 3.9 3.9 4.4  Chloride 101 - 111 mmol/L 112(H) 108 113(H)  CO2 22 - 32 mmol/L 20(L) 18(L) 15(L)  Calcium 8.9 - 10.3 mg/dL 8.6(L)  9.1 9.5   CXR 09/11/17: Mild vascular congestion   Discharge Instructions: Discharge Instructions    Call MD for:  difficulty breathing, headache or visual disturbances   Complete by:  As directed    Call MD for:  extreme fatigue   Complete by:  As directed    Call MD for:  hives   Complete by:  As directed    Call MD for:  persistant dizziness or light-headedness   Complete by:  As directed    Call MD for:  persistant nausea and vomiting   Complete by:  As directed    Call MD for:  redness, tenderness, or signs of infection (pain, swelling, redness, odor or green/yellow discharge around incision site)   Complete by:  As directed    Call MD for:  severe uncontrolled pain   Complete by:  As directed  Call MD for:  temperature >100.4   Complete by:  As directed    Diet - low sodium heart healthy   Complete by:  As directed    Discharge instructions   Complete by:  As directed    Please call the Internal Medicine Center at [phone] on Monday morning to set up a hospital follow up appointment for later this week (Friday if possible).   Do not take losartan until you are seen in the clinic. Resume taking your lasix 20mg  tomorrow (do not take it today).  Please work with the physical and occupational therapists to increase your strength, endurance, and independence. I think most of your shortness of breath at this time is due to deconditioning.   We have given you information on assisted living facilities which may be something to look into in the future. There is also the PACE program in [place] which provides a lot of support for seniors while helping them stay at home. Continue your discussion about what support you may need at home with your primary care provider.   Increase activity slowly   Complete by:  As directed      Signed: Alphonzo Grieve, MD 09/19/2017, 7:50 AM

## 2017-09-13 NOTE — Progress Notes (Signed)
Jane [name] to be D/C'd Home per MD order.  Discussed prescriptions and follow up appointments with the patient. Prescriptions given to patient, medication list explained in detail. Pt verbalized understanding.  Allergies as of 09/13/2017   No Known Allergies     Medication List    STOP taking these medications   losartan 100 MG tablet Commonly known as:  COZAAR   meloxicam 7.5 MG tablet Commonly known as:  MOBIC   oxyCODONE 5 MG immediate release tablet Commonly known as:  Oxy IR/ROXICODONE     TAKE these medications   acetaminophen 325 MG tablet Commonly known as:  TYLENOL Take 650 mg by mouth every 6 (six) hours as needed for mild pain, moderate pain or headache.   albuterol 108 (90 Base) MCG/ACT inhaler Commonly known as:  PROVENTIL HFA;VENTOLIN HFA Inhale 1-2 puffs into the lungs every 6 (six) hours as needed for wheezing or shortness of breath.   aspirin EC 81 MG tablet Take 81 mg by mouth daily.   atorvastatin 40 MG tablet Commonly known as:  LIPITOR Take 1 tablet (40 mg total) by mouth daily at 6 PM.   bisoprolol 5 MG tablet Commonly known as:  ZEBETA Take 0.5 tablets (2.5 mg total) by mouth daily.   furosemide 20 MG tablet Commonly known as:  LASIX Take 1 tablet (20 mg total) by mouth daily. Start taking this again tomorrow. What changed:  additional instructions   isosorbide mononitrate 30 MG 24 hr tablet Commonly known as:  IMDUR Take 1 tablet (30 mg total) by mouth daily.   LUBRIDERM DAILY MOISTURE Lotn Apply 1 application topically 2 (two) times daily. Apply topically to dry exfoliative areas of legs   NATURAL BALANCE TEARS OP Apply 1 drop to eye 3 (three) times daily.            Durable Medical Equipment  (From admission, onward)        Start     Ordered   09/12/17 1335  For home use only DME 3 n 1  Once     09/12/17 1334      Vitals:   09/13/17 0551 09/13/17 1000  BP: (!) 131/45 (!) 127/48  Pulse: 66 86  Resp: 18 18  Temp:  98 F (36.7 C) 98.4 F (36.9 C)  SpO2: 99% 98%    Skin clean, dry and intact without evidence of skin break down, no evidence of skin tears noted. IV catheter discontinued intact. Site without signs and symptoms of complications. Dressing and pressure applied. Pt denies pain at this time. No complaints noted.  An After Visit Summary was printed and given to the patient. Patient escorted via [place], and D/C home via private auto.  Dixie Dials RN, BSN

## 2017-09-16 ENCOUNTER — Telehealth: Payer: Self-pay | Admitting: *Deleted

## 2017-09-16 NOTE — Telephone Encounter (Signed)
[facility] referral placed. Thanks!

## 2017-09-16 NOTE — Telephone Encounter (Signed)
[facility] calls and states she has attempted to make contact with pt with no luck. Will go to pt home tomorrow. [facility] may be of help, if so please make referral.

## 2017-09-17 ENCOUNTER — Telehealth: Payer: Self-pay | Admitting: *Deleted

## 2017-09-17 NOTE — Telephone Encounter (Signed)
Pt calls and states she is a little mixed up about her meds. We went over each but she had them in 2 separate places and was not sure about some, also spoke to her about [place] coming to visit and helping her with medications, she was agreeable with this but stated she does not want someone to come to clean, assist her with personal care or cooking but yes to nurse. She will get all her meds close to the phone and call back to go over all her meds today or tomorrow.

## 2017-09-17 NOTE — Telephone Encounter (Signed)
Thank you Helen 

## 2017-09-18 DIAGNOSIS — I2510 Atherosclerotic heart disease of native coronary artery without angina pectoris: Secondary | ICD-10-CM | POA: Diagnosis not present

## 2017-09-18 DIAGNOSIS — M5440 Lumbago with sciatica, unspecified side: Secondary | ICD-10-CM | POA: Diagnosis not present

## 2017-09-18 DIAGNOSIS — M7062 Trochanteric bursitis, left hip: Secondary | ICD-10-CM | POA: Diagnosis not present

## 2017-09-18 DIAGNOSIS — I5032 Chronic diastolic (congestive) heart failure: Secondary | ICD-10-CM | POA: Diagnosis not present

## 2017-09-18 DIAGNOSIS — I130 Hypertensive heart and chronic kidney disease with heart failure and stage 1 through stage 4 chronic kidney disease, or unspecified chronic kidney disease: Secondary | ICD-10-CM | POA: Diagnosis not present

## 2017-09-18 DIAGNOSIS — N183 Chronic kidney disease, stage 3 (moderate): Secondary | ICD-10-CM | POA: Diagnosis not present

## 2017-09-19 ENCOUNTER — Telehealth: Payer: Self-pay | Admitting: Internal Medicine

## 2017-09-19 DIAGNOSIS — I130 Hypertensive heart and chronic kidney disease with heart failure and stage 1 through stage 4 chronic kidney disease, or unspecified chronic kidney disease: Secondary | ICD-10-CM | POA: Diagnosis not present

## 2017-09-19 DIAGNOSIS — M5440 Lumbago with sciatica, unspecified side: Secondary | ICD-10-CM | POA: Diagnosis not present

## 2017-09-19 DIAGNOSIS — M7062 Trochanteric bursitis, left hip: Secondary | ICD-10-CM | POA: Diagnosis not present

## 2017-09-19 DIAGNOSIS — I5032 Chronic diastolic (congestive) heart failure: Secondary | ICD-10-CM | POA: Diagnosis not present

## 2017-09-19 DIAGNOSIS — N183 Chronic kidney disease, stage 3 (moderate): Secondary | ICD-10-CM | POA: Diagnosis not present

## 2017-09-19 DIAGNOSIS — I2510 Atherosclerotic heart disease of native coronary artery without angina pectoris: Secondary | ICD-10-CM | POA: Diagnosis not present

## 2017-09-19 NOTE — Telephone Encounter (Signed)
Sharyn Lull from [facility] [phone]; verbal orders  Therapy 1 times a 1 week, 2 times a week for 4 weeks

## 2017-09-19 NOTE — Telephone Encounter (Signed)
Agreed.  Thanks.  

## 2017-09-19 NOTE — Telephone Encounter (Signed)
Do you agree?

## 2017-09-22 DIAGNOSIS — I5032 Chronic diastolic (congestive) heart failure: Secondary | ICD-10-CM | POA: Diagnosis not present

## 2017-09-22 DIAGNOSIS — N183 Chronic kidney disease, stage 3 (moderate): Secondary | ICD-10-CM | POA: Diagnosis not present

## 2017-09-22 DIAGNOSIS — I130 Hypertensive heart and chronic kidney disease with heart failure and stage 1 through stage 4 chronic kidney disease, or unspecified chronic kidney disease: Secondary | ICD-10-CM | POA: Diagnosis not present

## 2017-09-22 DIAGNOSIS — I2510 Atherosclerotic heart disease of native coronary artery without angina pectoris: Secondary | ICD-10-CM | POA: Diagnosis not present

## 2017-09-22 DIAGNOSIS — M7062 Trochanteric bursitis, left hip: Secondary | ICD-10-CM | POA: Diagnosis not present

## 2017-09-22 DIAGNOSIS — M5440 Lumbago with sciatica, unspecified side: Secondary | ICD-10-CM | POA: Diagnosis not present

## 2017-09-23 ENCOUNTER — Encounter: Payer: Self-pay | Admitting: Internal Medicine

## 2017-09-23 ENCOUNTER — Ambulatory Visit (INDEPENDENT_AMBULATORY_CARE_PROVIDER_SITE_OTHER): Payer: Medicare Other | Admitting: Internal Medicine

## 2017-09-23 ENCOUNTER — Other Ambulatory Visit: Payer: Self-pay

## 2017-09-23 VITALS — BP 157/52 | HR 63 | Temp 97.8°F | Wt 155.7 lb

## 2017-09-23 DIAGNOSIS — N183 Chronic kidney disease, stage 3 (moderate): Secondary | ICD-10-CM | POA: Diagnosis not present

## 2017-09-23 DIAGNOSIS — N179 Acute kidney failure, unspecified: Secondary | ICD-10-CM

## 2017-09-23 DIAGNOSIS — I509 Heart failure, unspecified: Secondary | ICD-10-CM | POA: Diagnosis not present

## 2017-09-23 DIAGNOSIS — I2510 Atherosclerotic heart disease of native coronary artery without angina pectoris: Secondary | ICD-10-CM

## 2017-09-23 DIAGNOSIS — E785 Hyperlipidemia, unspecified: Secondary | ICD-10-CM

## 2017-09-23 DIAGNOSIS — Z8673 Personal history of transient ischemic attack (TIA), and cerebral infarction without residual deficits: Secondary | ICD-10-CM | POA: Diagnosis not present

## 2017-09-23 DIAGNOSIS — I5032 Chronic diastolic (congestive) heart failure: Secondary | ICD-10-CM | POA: Diagnosis not present

## 2017-09-23 DIAGNOSIS — R0602 Shortness of breath: Secondary | ICD-10-CM

## 2017-09-23 DIAGNOSIS — M5440 Lumbago with sciatica, unspecified side: Secondary | ICD-10-CM | POA: Diagnosis not present

## 2017-09-23 DIAGNOSIS — R5381 Other malaise: Secondary | ICD-10-CM

## 2017-09-23 DIAGNOSIS — I130 Hypertensive heart and chronic kidney disease with heart failure and stage 1 through stage 4 chronic kidney disease, or unspecified chronic kidney disease: Secondary | ICD-10-CM

## 2017-09-23 DIAGNOSIS — N189 Chronic kidney disease, unspecified: Secondary | ICD-10-CM

## 2017-09-23 DIAGNOSIS — M7062 Trochanteric bursitis, left hip: Secondary | ICD-10-CM | POA: Diagnosis not present

## 2017-09-23 DIAGNOSIS — I10 Essential (primary) hypertension: Secondary | ICD-10-CM

## 2017-09-23 MED ORDER — LOSARTAN POTASSIUM 100 MG PO TABS: 100.0000 mg | ORAL_TABLET | Freq: Every day | ORAL | 11 refills | Status: DC

## 2017-09-23 NOTE — Patient Instructions (Addendum)
FOLLOW-UP INSTRUCTIONS When: next available with Dr Ronalee Red For: medication adherence What to bring: current medications  Resume taking your Losartan 100mg  daily as prescribed.  Please refer to your current medication printout for what to take every day.  We are checking labs today and will let you know if things need to be changed.  Please be on the lookout for home health therapy and nursing to help with medication and to get you stronger

## 2017-09-23 NOTE — Assessment & Plan Note (Signed)
BP today at HFU is 157/52.  She has been holding her Losartan since discharge.  She has no chest pain.  She reports SOB with exertion similar to what she described at hospital admission but no worsening.  She has no edema.  Plan: - Resume Losartan - Continue bisoprolol, Lasix, and isosorbide mononitrate - BMET today - F/u with PCP April 18

## 2017-09-23 NOTE — Assessment & Plan Note (Signed)
Her dyspnea from this recent admission was felt to be due to deconditioning.  She was ordered [facility] PT/OT at discharge.  There are phone notes from recent days indicating that this is in the process of being established.  She appears euvolemic on exam today without edema, dyspnea with conversation, stable weight, and oxygen saturation at 100% on room air.  She is taking her Lasix 20mg  daily as prescribed at discharge.   Plan: - Follow up scheduled with PCP on April 18.  Please follow up that Home health services were able to be obtained and assess for any further needs in the home.

## 2017-09-23 NOTE — Progress Notes (Signed)
CC: hospital f/u for AKI, shortness of breath, deconditioning, HTN  HPI:  Jane [name] is a 84 y.o. woman with a past medical history listed below here today for follow up of her AKI, SOB, HTN, and deconditioning.  AKI - on CKD 3.  Discharge creatinine was 1.28 down from 1.6 on admission.  She has been holding her Mobic and Losartan since discharge per her report.  Her lasix has been resumed and she is taking 20mg  daily.  SOB/Deconditioning - her BNP was elevated on evaluation to level of prior HF exacerbation so she was given 1 dose of IV lasix.  On admission by IMTS patient was asymptomatic, hemodynamically stable, and euvolemic.  Her shortness of breath was mainly with activity but denied chest pain, dizziness, or other signs/symptoms of volume overload. She lives by herself with some assistance from daughter in law, however per patient's report she still needed significant assistance at home. PT/OT evaluated patient and recommended HHPT/OT which were ordered at discharge. SW was consulted and provided with resources for Assisted Living Facilities.    CAD & HTN - Patient was continued on home asa 81mg  daily, atorvastatin 40mg  daily, bisoprolol 2.5mg  daily and imdur 30mg  daily. Her losartan was held due to AKI.  For details of today's visit and the status of her chronic medical issues please refer to the assessment and plan.   Past Medical History:  Diagnosis Date  . Anemia   . Aortic insufficiency    mild  . Arthritis    "arms" (08/09/2015)  . CAD (coronary artery disease)    cath 08/09/2014 95% stenosis in prox to mid RCA s/p DES, 80-90% prox OM2, 50% distal LAD  . CHF (congestive heart failure) ([place])   . CKD (chronic kidney disease) stage 3, GFR 30-59 ml/min (HCC)   . Colon polyp    2009 colonoscopy, not retrieved for pathology  . Dyspnea   . Ectropion of left lower eyelid   . GERD (gastroesophageal reflux disease) 2009   EGD with benign gastric polyp too  . Hyperlipidemia     . Hypertension   . Pneumonia    history of  . Stroke ([place]) 1975  . Vitamin D deficiency    Review of Systems:  Please see pertinent ROS reviewed in HPI and problem based charting.   Physical Exam:  Vitals:   09/23/17 1056  BP: (!) 157/52  Pulse: 63  Temp: 97.8 F (36.6 C)  TempSrc: Oral  SpO2: 100%  Weight: 155 lb 11.2 oz (70.6 kg)   Physical Exam  Constitutional: She is well-developed, well-nourished, and in no distress.  Able to speak in full sentences with dyspnea.  O2 saturation is 100%.  She has all her medications with her. She is somewhat confused on what to be taking.   Eyes: Conjunctivae and EOM are normal.  Cardiovascular: Normal rate and regular rhythm.  Pulmonary/Chest: Effort normal and breath sounds normal. She has no wheezes. She has no rales.  Musculoskeletal: She exhibits no edema.  Neurological: She is alert.  Skin: Skin is warm and dry.  Psychiatric: Mood and affect normal.     Assessment & Plan:   See Encounters Tab for problem based charting.  Patient discussed with Dr. Lynnae January.  Hypertension BP today at HFU is 157/52.  She has been holding her Losartan since discharge.  She has no chest pain.  She reports SOB with exertion similar to what she described at hospital admission but no worsening.  She has no  edema.  Plan: - Resume Losartan - Continue bisoprolol, Lasix, and isosorbide mononitrate - BMET today - F/u with PCP April 18  CKD (chronic kidney disease) Admitted 3/14-3/16 with AKI and creatinine 1.6.  Back to baseline of 1.28 at discharge.  Plan: - Check BMET today.   Dyspnea Her dyspnea from this recent admission was felt to be due to deconditioning.  She was ordered [facility] PT/OT at discharge.  There are phone notes from recent days indicating that this is in the process of being established.  She appears euvolemic on exam today without edema, dyspnea with conversation, stable weight, and oxygen saturation at 100% on room air.  She is  taking her Lasix 20mg  daily as prescribed at discharge.   Plan: - Follow up scheduled with PCP on April 18.  Please follow up that Home health services were able to be obtained and assess for any further needs in the home.

## 2017-09-23 NOTE — Progress Notes (Signed)
Internal Medicine Clinic Attending  Case discussed with Dr. Wallace at the time of the visit.  We reviewed the resident's history and exam and pertinent patient test results.  I agree with the assessment, diagnosis, and plan of care documented in the resident's note.  

## 2017-09-23 NOTE — Assessment & Plan Note (Signed)
Admitted 3/14-3/16 with AKI and creatinine 1.6.  Back to baseline of 1.28 at discharge.  Plan: - Check BMET today.

## 2017-09-24 LAB — BMP8+ANION GAP
ANION GAP: 17 mmol/L (ref 10.0–18.0)
BUN/Creatinine Ratio: 21 (ref 12–28)
BUN: 26 mg/dL (ref 8–27)
CO2: 18 mmol/L — ABNORMAL LOW (ref 20–29)
CREATININE: 1.24 mg/dL — AB (ref 0.57–1.00)
Calcium: 9 mg/dL (ref 8.7–10.3)
Chloride: 110 mmol/L — ABNORMAL HIGH (ref 96–106)
GFR calc Af Amer: 45 mL/min/{1.73_m2} — ABNORMAL LOW (ref >59)
GFR calc non Af Amer: 39 mL/min/{1.73_m2} — ABNORMAL LOW (ref >59)
Glucose: 78 mg/dL (ref 65–99)
Potassium: 3.8 mmol/L (ref 3.5–5.2)
Sodium: 145 mmol/L — ABNORMAL HIGH (ref 134–144)

## 2017-09-25 ENCOUNTER — Other Ambulatory Visit: Payer: Self-pay | Admitting: *Deleted

## 2017-09-25 DIAGNOSIS — N183 Chronic kidney disease, stage 3 (moderate): Secondary | ICD-10-CM | POA: Diagnosis not present

## 2017-09-25 DIAGNOSIS — I130 Hypertensive heart and chronic kidney disease with heart failure and stage 1 through stage 4 chronic kidney disease, or unspecified chronic kidney disease: Secondary | ICD-10-CM | POA: Diagnosis not present

## 2017-09-25 DIAGNOSIS — I5032 Chronic diastolic (congestive) heart failure: Secondary | ICD-10-CM | POA: Diagnosis not present

## 2017-09-25 DIAGNOSIS — I2510 Atherosclerotic heart disease of native coronary artery without angina pectoris: Secondary | ICD-10-CM | POA: Diagnosis not present

## 2017-09-25 DIAGNOSIS — M5440 Lumbago with sciatica, unspecified side: Secondary | ICD-10-CM | POA: Diagnosis not present

## 2017-09-25 DIAGNOSIS — M7062 Trochanteric bursitis, left hip: Secondary | ICD-10-CM | POA: Diagnosis not present

## 2017-09-25 NOTE — Patient Outreach (Addendum)
[facility]) Care Management  09/25/2017  Jane [name] 06/27/1929 [id number]   [name] is an 84 year old female patient with past medical history which includes hyperlipidemia, PAD (peripheral artery disease), ectropion of lower left eyelid, physical deconditioning, and CKD (chronic kidney disease). [name] was most recently discharged from the hospital on 09/13/17 after a 2 day stay for shortness of breath.   At discharge from [name]'s hospitalization, home health services were ordered (HHPT/OT) to address needs around deconditioning and she and her family were advised to consider assisted living placement vs PACE.   [name] was seen at the internal medicine clinic on 09/23/17 and referred to [place] [facility] for assistance with follow up on "COPD/Pneumonia" and medication adherence concerns. [name] has been active with [place] [facility] in the past and was last contacted by [place] in 05/2017.   I reached out to [name] at the phone number listed for her home and was able to leave a HIPPA compliant voice message requesting a return call. I also attempted to contact her via her listed mobile number and did not get an answer and was unable to leave a message.   Plan: I will reach out to [name] again on Monday. I will send a letter to her home outlining our services and offering our assistance.    [facility]  [phone]

## 2017-09-26 ENCOUNTER — Other Ambulatory Visit: Payer: Self-pay | Admitting: *Deleted

## 2017-09-26 NOTE — Patient Outreach (Addendum)
[facility]) Care Management  09/26/2017  Jane [name] 1928-08-04 [id number]   Jane [name] is an 84 year old female patient with past medical history which includes hyperlipidemia, PAD (peripheral artery disease), ectropion of lower left eyelid, physical deconditioning, and CKD (chronic kidney disease). Jane [name] was most recently discharged from the hospital on 09/13/17 after a 2 day stay for shortness of breath.   At discharge from Jane [name]'s hospitalization, home health services were ordered (HHPT/OT) to address needs around deconditioning and she and her family were advised to consider assisted living placement vs PACE.   Jane [name] was seen at the internal medicine clinic on 09/23/17 and referred to [facility] [facility] for assistance with follow up on "COPD/Pneumonia" and medication adherence concerns. Jane [name] has been active with [place] [facility] in the past and was last contacted by [place] in 05/2017.   I received a call from Jane [name]'s son today in response to the message I left for him yesterday. He advised that I return a call to Jane [name] and said that she sometimes has trouble getting to the phone. I called Jane [name] and we spoke at length today.   In Home Care Services - Jane [name] was able to tell me that her home health physical therapist saw her a couple of days ago and is scheduled to see her again at home next week. She says she is doing her prescribed exercises. She reports using a walker but says she doesn't always use it at home because it "gets in the way." I reviewed home safety precautions with her to include fall risk precautions. She denies having had any falls in the last 12 months.   Chronic Health Conditions (CHF/ Pulmonary Health Conditions) - Jane [name] is fairly knowledgeable about her heart failure diagnosis and medications. She reports weighing daily and recording and says her weight today was 159lb which is  consistent with her weight when she left [facility] in February.   Jane [name]'s most recent trip to the hospital was for shortness of breath. Her BMP was found to be mildly elevated and consistent with her baseline. She was given one dose of IV lasix with improvement in results. However, Jane [name] was hospitalized in January with pulmonary complications resulting from an infection associated with influenza. Today, Jane [name] denies shortness of breath or other breathing difficulties. She is able to confirm appropriate use of her prescribed inhalers. We reviewed signs and symptoms of worsening pulmonary health and when she should call her provider vs call EMS. I also advised that she can call her home health nurse or me if she has a question about what she should do about a particular health concern.   Social Support and [employer] Needs - Jane [name] lives alone; she says she has a neighbor who provides some help intermittently; her son lives nearby but has health conditions and is of limited availability; her daughter in Sports coach (school bus driver) helps with home management and transportation but also has considerable caregiver responsibilities with her husband (Jane [name]'s son); Jane [name]. Swaminathan nephew comes by regularly and also provides assistance with transportation and shopping.   Level of Care Concerns - I discussed at length today with Jane [name] whether she felt her current home situation provided for her needs, especially given her limited mobility and deconditioning. She says she wants to stay in her apartment for now and doesn't wish to change her level of care. I reviewed with  her options such as ALF, PCS and CAP services or programs such as PACE. At this time, Jane [name]. Takeshita says she wishes to remain in her apartment of doesn't want to pursue other services stating she has friends who have had "bad experiences" with caregivers who come into the home. I advised that she could  think about the options and I would be happy to help her with initiation of services should she change her mind.   Plan: Jane [name]. Hanks agreed to allow [place] [facility] engagement. She says she doesn't feel she needs a visit from anyone else right now with [facility] services in place but would allow me to return a call to her.   THN CM Care Plan Problem One     Most Recent Value  Care Plan Problem One  Level of Care Concerns  Role Documenting the Problem One  Care Management Coordinator  Care Plan for Problem One  Active  THN Long Term Goal   Over the next 31 days, patient will verbalize understanding of level of care options and how to initiate change in level of care if desired  [facility] Long Term Goal Start Date  09/26/17  Interventions for Problem One Long Term Goal  reviewed level of care options with patient, discussing current needs and possible future needs  THN CM Short Term Goal #1   Over the next 30 days, patient will discuss level of care options with her family   THN CM Short Term Goal #1 Start Date  09/26/17  Interventions for Short Term Goal #1  encouraged patient to have ongoing discussions with family about level of care needs and options    [facility] CM Care Plan Problem Two     Most Recent Value  Care Plan Problem Two  Needs related to Self Health Management of Chronic Health Conditions  Role Documenting the Problem Two  Care Management Brush for Problem Two  Active  Interventions for Problem Two Long Term Goal   interviewed patient about current knowledge and practices around chronic disease states,  reviewed provider prescribed plan of care for managmeent of chronic diseases,  medications reviewed  THN Long Term Goal  Over the next 31 days, patient will verbalize understanding of long term plan of care for management of chronic health conditions  THN Long Term Goal Start Date  09/26/17  THN CM Short Term Goal #1   Over the next 30 days, patient will take all medications  as prescribed as evidenced by provider reports and patient verbal report of same  [facility] CM Short Term Goal #1 Start Date  09/26/17  Interventions for Short Term Goal #2   medications reviewed with patient  THN CM Short Term Goal #2   Over the next 30 days, patient will monitor weight daily and record, calling cardilogy provider for weight gain of 3lb overnight or 5lb in a week  THN CM Short Term Goal #2 Start Date  09/26/17  Interventions for Short Term Goal #2  reviewed recent weigh findings,  discussed importance of ongoing self health monitoring and reporting findings to provider as instructed  THN CM Short Term Goal #3   Over the next 30 days, patient will verbalize knowledge of signs and symptoms for which she should call nurse or provider vs EMS  [facility] CM Short Term Goal #3 Start Date  09/26/17  Interventions for Short Term Goal #3  reviewed signs and symptoms for which patient should reach out for help and  options for help including HHRN, Provider office, RNCM, vs EMS      [facility] [place] [facility]  [phone]

## 2017-09-29 ENCOUNTER — Ambulatory Visit: Payer: Self-pay | Admitting: *Deleted

## 2017-09-30 DIAGNOSIS — I5032 Chronic diastolic (congestive) heart failure: Secondary | ICD-10-CM | POA: Diagnosis not present

## 2017-09-30 DIAGNOSIS — M5440 Lumbago with sciatica, unspecified side: Secondary | ICD-10-CM | POA: Diagnosis not present

## 2017-09-30 DIAGNOSIS — N183 Chronic kidney disease, stage 3 (moderate): Secondary | ICD-10-CM | POA: Diagnosis not present

## 2017-09-30 DIAGNOSIS — M7062 Trochanteric bursitis, left hip: Secondary | ICD-10-CM | POA: Diagnosis not present

## 2017-09-30 DIAGNOSIS — I130 Hypertensive heart and chronic kidney disease with heart failure and stage 1 through stage 4 chronic kidney disease, or unspecified chronic kidney disease: Secondary | ICD-10-CM | POA: Diagnosis not present

## 2017-09-30 DIAGNOSIS — I2510 Atherosclerotic heart disease of native coronary artery without angina pectoris: Secondary | ICD-10-CM | POA: Diagnosis not present

## 2017-10-02 ENCOUNTER — Other Ambulatory Visit: Payer: Self-pay | Admitting: *Deleted

## 2017-10-02 DIAGNOSIS — I130 Hypertensive heart and chronic kidney disease with heart failure and stage 1 through stage 4 chronic kidney disease, or unspecified chronic kidney disease: Secondary | ICD-10-CM | POA: Diagnosis not present

## 2017-10-02 DIAGNOSIS — M5440 Lumbago with sciatica, unspecified side: Secondary | ICD-10-CM | POA: Diagnosis not present

## 2017-10-02 DIAGNOSIS — N183 Chronic kidney disease, stage 3 (moderate): Secondary | ICD-10-CM | POA: Diagnosis not present

## 2017-10-02 DIAGNOSIS — M7062 Trochanteric bursitis, left hip: Secondary | ICD-10-CM | POA: Diagnosis not present

## 2017-10-02 DIAGNOSIS — I2510 Atherosclerotic heart disease of native coronary artery without angina pectoris: Secondary | ICD-10-CM | POA: Diagnosis not present

## 2017-10-02 DIAGNOSIS — I5032 Chronic diastolic (congestive) heart failure: Secondary | ICD-10-CM | POA: Diagnosis not present

## 2017-10-02 NOTE — Patient Outreach (Signed)
[facility]) Care Management  10/02/2017  Jane [name] Nov 01, 1928 [id number]  Unable to make contact with [name] by phone today.   Plan: I will reach out to [name] again next week.    [place] [facility]  [phone]

## 2017-10-07 DIAGNOSIS — I130 Hypertensive heart and chronic kidney disease with heart failure and stage 1 through stage 4 chronic kidney disease, or unspecified chronic kidney disease: Secondary | ICD-10-CM | POA: Diagnosis not present

## 2017-10-07 DIAGNOSIS — M5440 Lumbago with sciatica, unspecified side: Secondary | ICD-10-CM | POA: Diagnosis not present

## 2017-10-07 DIAGNOSIS — N183 Chronic kidney disease, stage 3 (moderate): Secondary | ICD-10-CM | POA: Diagnosis not present

## 2017-10-07 DIAGNOSIS — I2510 Atherosclerotic heart disease of native coronary artery without angina pectoris: Secondary | ICD-10-CM | POA: Diagnosis not present

## 2017-10-07 DIAGNOSIS — I5032 Chronic diastolic (congestive) heart failure: Secondary | ICD-10-CM | POA: Diagnosis not present

## 2017-10-07 DIAGNOSIS — M7062 Trochanteric bursitis, left hip: Secondary | ICD-10-CM | POA: Diagnosis not present

## 2017-10-09 ENCOUNTER — Other Ambulatory Visit: Payer: Self-pay | Admitting: *Deleted

## 2017-10-09 DIAGNOSIS — N183 Chronic kidney disease, stage 3 (moderate): Secondary | ICD-10-CM | POA: Diagnosis not present

## 2017-10-09 DIAGNOSIS — M7062 Trochanteric bursitis, left hip: Secondary | ICD-10-CM | POA: Diagnosis not present

## 2017-10-09 DIAGNOSIS — I5032 Chronic diastolic (congestive) heart failure: Secondary | ICD-10-CM | POA: Diagnosis not present

## 2017-10-09 DIAGNOSIS — M5440 Lumbago with sciatica, unspecified side: Secondary | ICD-10-CM | POA: Diagnosis not present

## 2017-10-09 DIAGNOSIS — I130 Hypertensive heart and chronic kidney disease with heart failure and stage 1 through stage 4 chronic kidney disease, or unspecified chronic kidney disease: Secondary | ICD-10-CM | POA: Diagnosis not present

## 2017-10-09 DIAGNOSIS — I2510 Atherosclerotic heart disease of native coronary artery without angina pectoris: Secondary | ICD-10-CM | POA: Diagnosis not present

## 2017-10-09 NOTE — Patient Outreach (Signed)
[facility]) Care Management  10/09/2017  Jane [name] 1929-02-18 [id number]  Jane [name] is an 84 year old female patient with past medical history which includes hyperlipidemia, PAD (peripheral artery disease), ectropion of lower left eyelid, physical deconditioning, and CKD (chronic kidney disease). Jane [name] was most recently discharged from the hospital on 09/13/17 after a 2 day stay for shortness of breath.   Jane [name] was seen at the internal medicine clinic on 09/23/17 and referred to [facility] for assistance with follow up on "COPD/Pneumonia" and medication adherence concerns.  In Home Care Services - Jane [name] is receiving HHPT/OT to address issues around deconditioning. She and her family were advised to consider assisted living placement vs PACE @ hospital d/c but declined both. We also discussed level of care options in follow up y phone and she continues to decline. Jane [name] reports doing her prescribed exercises and using her walker at home. We have discussed home safety precautions with her to include fall risk precautions. She denies having had any falls in the last 12 months.   Chronic Health Conditions (CHF/ Pulmonary Health Conditions) - Jane [name] denies having any difficulty with her breathing and says she tries to "take it slow" so as not to over-exert. She is weighing daily and knows she is to call her doctor for weight gain of 3lb overnight or 5lb in a week or if she experiences swelling or increased/worsened shortness of breath. She is able to confirm appropriate use of her prescribed inhalers. We reviewed signs and symptoms of worsening pulmonary health and when she should call her provider vs call EMS.   THN CM Care Plan Problem One     Most Recent Value  Care Plan Problem One  Level of Care Concerns  Role Documenting the Problem One  Care Management Coordinator  Care Plan for Problem One  Active  THN Long Term Goal    Over the next 31 days, patient will verbalize understanding of level of care options and how to initiate change in level of care if desired  THN Long Term Goal Start Date  09/26/17  THN CM Short Term Goal #1   Over the next 30 days, patient will discuss level of care options with her family   THN CM Short Term Goal #1 Start Date  09/26/17    [facility] CM Care Plan Problem Two     Most Recent Value  Care Plan Problem Two  Needs related to Self Health Management of Chronic Health Conditions  Role Documenting the Problem Two  Care Management Coordinator  Care Plan for Problem Two  Active  THN Long Term Goal  Over the next 31 days, patient will verbalize understanding of long term plan of care for management of chronic health conditions  THN Long Term Goal Start Date  09/26/17  THN CM Short Term Goal #1   Over the next 30 days, patient will take all medications as prescribed as evidenced by provider reports and patient verbal report of same  THN CM Short Term Goal #1 Start Date  09/26/17  THN CM Short Term Goal #2   Over the next 30 days, patient will monitor weight daily and record, calling cardilogy provider for weight gain of 3lb overnight or 5lb in a week  THN CM Short Term Goal #2 Start Date  09/26/17  THN CM Short Term Goal #3   Over the next 30 days, patient will verbalize knowledge of signs and symptoms  for which she should call nurse or provider vs EMS  [facility] CM Short Term Goal #3 Start Date  09/26/17      [facility] Care Management  [phone]

## 2017-10-13 DIAGNOSIS — M5440 Lumbago with sciatica, unspecified side: Secondary | ICD-10-CM | POA: Diagnosis not present

## 2017-10-13 DIAGNOSIS — I130 Hypertensive heart and chronic kidney disease with heart failure and stage 1 through stage 4 chronic kidney disease, or unspecified chronic kidney disease: Secondary | ICD-10-CM | POA: Diagnosis not present

## 2017-10-13 DIAGNOSIS — I2510 Atherosclerotic heart disease of native coronary artery without angina pectoris: Secondary | ICD-10-CM | POA: Diagnosis not present

## 2017-10-13 DIAGNOSIS — M7062 Trochanteric bursitis, left hip: Secondary | ICD-10-CM | POA: Diagnosis not present

## 2017-10-13 DIAGNOSIS — I5032 Chronic diastolic (congestive) heart failure: Secondary | ICD-10-CM | POA: Diagnosis not present

## 2017-10-13 DIAGNOSIS — N183 Chronic kidney disease, stage 3 (moderate): Secondary | ICD-10-CM | POA: Diagnosis not present

## 2017-10-14 DIAGNOSIS — I130 Hypertensive heart and chronic kidney disease with heart failure and stage 1 through stage 4 chronic kidney disease, or unspecified chronic kidney disease: Secondary | ICD-10-CM | POA: Diagnosis not present

## 2017-10-14 DIAGNOSIS — I5032 Chronic diastolic (congestive) heart failure: Secondary | ICD-10-CM | POA: Diagnosis not present

## 2017-10-14 DIAGNOSIS — M7062 Trochanteric bursitis, left hip: Secondary | ICD-10-CM | POA: Diagnosis not present

## 2017-10-14 DIAGNOSIS — M5440 Lumbago with sciatica, unspecified side: Secondary | ICD-10-CM | POA: Diagnosis not present

## 2017-10-14 DIAGNOSIS — N183 Chronic kidney disease, stage 3 (moderate): Secondary | ICD-10-CM | POA: Diagnosis not present

## 2017-10-14 DIAGNOSIS — I2510 Atherosclerotic heart disease of native coronary artery without angina pectoris: Secondary | ICD-10-CM | POA: Diagnosis not present

## 2017-10-15 DIAGNOSIS — M7062 Trochanteric bursitis, left hip: Secondary | ICD-10-CM | POA: Diagnosis not present

## 2017-10-15 DIAGNOSIS — I5032 Chronic diastolic (congestive) heart failure: Secondary | ICD-10-CM | POA: Diagnosis not present

## 2017-10-15 DIAGNOSIS — I130 Hypertensive heart and chronic kidney disease with heart failure and stage 1 through stage 4 chronic kidney disease, or unspecified chronic kidney disease: Secondary | ICD-10-CM | POA: Diagnosis not present

## 2017-10-15 DIAGNOSIS — N183 Chronic kidney disease, stage 3 (moderate): Secondary | ICD-10-CM | POA: Diagnosis not present

## 2017-10-15 DIAGNOSIS — M5440 Lumbago with sciatica, unspecified side: Secondary | ICD-10-CM | POA: Diagnosis not present

## 2017-10-15 DIAGNOSIS — I2510 Atherosclerotic heart disease of native coronary artery without angina pectoris: Secondary | ICD-10-CM | POA: Diagnosis not present

## 2017-10-16 ENCOUNTER — Ambulatory Visit (INDEPENDENT_AMBULATORY_CARE_PROVIDER_SITE_OTHER): Payer: Medicare Other | Admitting: Internal Medicine

## 2017-10-16 ENCOUNTER — Other Ambulatory Visit: Payer: Self-pay

## 2017-10-16 ENCOUNTER — Encounter: Payer: Self-pay | Admitting: Internal Medicine

## 2017-10-16 VITALS — BP 131/53 | HR 74 | Temp 98.2°F | Ht 64.0 in | Wt 154.8 lb

## 2017-10-16 DIAGNOSIS — I130 Hypertensive heart and chronic kidney disease with heart failure and stage 1 through stage 4 chronic kidney disease, or unspecified chronic kidney disease: Secondary | ICD-10-CM

## 2017-10-16 DIAGNOSIS — I2510 Atherosclerotic heart disease of native coronary artery without angina pectoris: Secondary | ICD-10-CM

## 2017-10-16 DIAGNOSIS — H02105 Unspecified ectropion of left lower eyelid: Secondary | ICD-10-CM

## 2017-10-16 DIAGNOSIS — N183 Chronic kidney disease, stage 3 unspecified: Secondary | ICD-10-CM

## 2017-10-16 DIAGNOSIS — H02401 Unspecified ptosis of right eyelid: Secondary | ICD-10-CM

## 2017-10-16 DIAGNOSIS — R5381 Other malaise: Secondary | ICD-10-CM | POA: Diagnosis not present

## 2017-10-16 DIAGNOSIS — Z79899 Other long term (current) drug therapy: Secondary | ICD-10-CM | POA: Diagnosis not present

## 2017-10-16 DIAGNOSIS — Z8673 Personal history of transient ischemic attack (TIA), and cerebral infarction without residual deficits: Secondary | ICD-10-CM

## 2017-10-16 DIAGNOSIS — H538 Other visual disturbances: Secondary | ICD-10-CM | POA: Diagnosis not present

## 2017-10-16 DIAGNOSIS — Z7982 Long term (current) use of aspirin: Secondary | ICD-10-CM

## 2017-10-16 DIAGNOSIS — Z87891 Personal history of nicotine dependence: Secondary | ICD-10-CM | POA: Diagnosis not present

## 2017-10-16 DIAGNOSIS — R413 Other amnesia: Secondary | ICD-10-CM

## 2017-10-16 DIAGNOSIS — I5030 Unspecified diastolic (congestive) heart failure: Secondary | ICD-10-CM

## 2017-10-16 DIAGNOSIS — I5032 Chronic diastolic (congestive) heart failure: Secondary | ICD-10-CM

## 2017-10-16 DIAGNOSIS — Z955 Presence of coronary angioplasty implant and graft: Secondary | ICD-10-CM | POA: Diagnosis not present

## 2017-10-16 DIAGNOSIS — I739 Peripheral vascular disease, unspecified: Secondary | ICD-10-CM

## 2017-10-16 DIAGNOSIS — H9190 Unspecified hearing loss, unspecified ear: Secondary | ICD-10-CM | POA: Diagnosis not present

## 2017-10-16 DIAGNOSIS — I10 Essential (primary) hypertension: Secondary | ICD-10-CM

## 2017-10-16 NOTE — Assessment & Plan Note (Signed)
Assessment Left lower eyelid red and irritated. Ocular exam is otherwise normal. She denies discharge or foreign body sensation. She does endorse intermittent blurry vision, however on exam with having her close each eye, she denies blurriness. She denies double vision. She was referred to ophthalmology in 2016, however was told that she needed to hold off on surgery until she could get off of aspirin. Will send another referral to ophthalmology  Plan - Referral sent to ophthalmology (previously saw Dr. Katy Fitch) - Continue artifical tears

## 2017-10-16 NOTE — Assessment & Plan Note (Signed)
Stable. Has now restarted losartan after resolution of AKI - Continue to monitor

## 2017-10-16 NOTE — Assessment & Plan Note (Signed)
Assessment TTE 07/2017 with LVEF 55-60% and grade 1 DD. Does endorse intermittent SOB, however she is euvolemic on exam and I suspect that her SOB may be partially a result of physical deconditioning. Not hypoxic or tachypneic. Reports compliance with lasix, denies dysuria or other urinary symptoms.  Plan - Continue lasix 20mg  daily - Continue bisoprolol 2.5mg  daily - Continue losartan 100mg  daily

## 2017-10-16 NOTE — Assessment & Plan Note (Addendum)
Assessment BP 131/53 today. Patient asymptomatic.  Plan - Continue losartan 100mg  daily, bisoprolol 2.5mg  daily, lasix 20mg  daily, and isosorbide mononitrate 30mg  daily

## 2017-10-16 NOTE — Patient Instructions (Signed)
FOLLOW-UP INSTRUCTIONS When: 3 months For: BP management What to bring: medications   Jane [name],  It was a pleasure to see you again today.  Your blood pressure looks great. Please continue to take all of your medicines every day.  We are sending a referral to the eye doctor. Their office should be contacting you.  Please see [place] back in about 3 months.

## 2017-10-16 NOTE — Assessment & Plan Note (Signed)
Assessment/Plan Patient has declined SNF/ALF, stating that she prefers to be at home. She appears to have some memory deficits, but is able to take her medications and walk around her house just fine. She has a nephew who helps drive her. Patient herself does not drive. She may need more support than what she is currently getting at home, however the patient is resistant to moving from her home. Possibly would benefit from services at [facility]. Patient not accompanied by family/friends today. Will continue these discussions, hopefully with family present as well.

## 2017-10-16 NOTE — Progress Notes (Signed)
   CC: management of HTN  HPI:  Jane [name] is a 84 y.o. female with PMH of HTN, CAD s/p DES to RCA in 2016, PAD, HFpEF (TTE 07/2017: LVEF 55-60%, grade 1 DD), CKD, and hx of CVA who presents for management of BP.  Patient recently admitted 3/14-3/16 for SOB. She was recommended to discharge to SNF or to transition to ALF, however patient declined. She prefers to be at home. She lives at home alone and states that she is normally able to ambulate without assistance. She denies recent falls. She has a nephew who helps drive her around or picks things up for her. She does do a little bit of cooking. She is able to take her medications. She reports that PT is coming to her house to work with her twice a week.  HTN: BP 131/53 today. She reports compliance with her BP medications. Denies HA, lightheadedness/dizziness, or CP.  HFpEF: Endorses intermittent shortness of breath with exertion. Denies orthopnea or lower extremity swelling.  Blurry vision: Patient reports blurry vision, mostly in her R eye. She does have L ectropion of lower eyelid, however she states that she was previously seen by ophthalmology and they told her she needed to be off of aspirin before they would do surgery.  Please see the assessment and plan below for the status of the patient's chronic medical problems.  Past Medical History:  Diagnosis Date  . Anemia   . Aortic insufficiency    mild  . Arthritis    "arms" (08/09/2015)  . CAD (coronary artery disease)    cath 08/09/2014 95% stenosis in prox to mid RCA s/p DES, 80-90% prox OM2, 50% distal LAD  . CHF (congestive heart failure) ([place])   . CKD (chronic kidney disease) stage 3, GFR 30-59 ml/min (HCC)   . Colon polyp    2009 colonoscopy, not retrieved for pathology  . Dyspnea   . Ectropion of left lower eyelid   . GERD (gastroesophageal reflux disease) 2009   EGD with benign gastric polyp too  . Hyperlipidemia   . Hypertension   . Pneumonia    history of  .  Stroke ([name]) 1975  . Vitamin D deficiency    Review of Systems:   Negative except as per HPI  Physical Exam:  Vitals:   10/16/17 1318  BP: (!) 131/53  Pulse: 74  Temp: 98.2 F (36.8 C)  TempSrc: Oral  SpO2: 98%  Weight: 154 lb 12.8 oz (70.2 kg)  Height: 5\' 4"  (1.626 m)   GEN: Elderly female sitting in wheelchair in NAD EYES: PERRL, anicteric sclera. Ectropion of left lower eyelid. Ptosis of R eyelid. EOMI intact HENT: MMM, no visible lesions CV: NR & RR, no m/r/g PULM: CTAB, no wheezes or rales EXT: No LE edema NEURO: CN II-XII intact, except for mild hearing loss  Assessment & Plan:   See Encounters Tab for problem based charting.  Patient discussed with Dr. Evette Doffing

## 2017-10-17 DIAGNOSIS — M7062 Trochanteric bursitis, left hip: Secondary | ICD-10-CM | POA: Diagnosis not present

## 2017-10-17 DIAGNOSIS — I5032 Chronic diastolic (congestive) heart failure: Secondary | ICD-10-CM | POA: Diagnosis not present

## 2017-10-17 DIAGNOSIS — I130 Hypertensive heart and chronic kidney disease with heart failure and stage 1 through stage 4 chronic kidney disease, or unspecified chronic kidney disease: Secondary | ICD-10-CM | POA: Diagnosis not present

## 2017-10-17 DIAGNOSIS — M5440 Lumbago with sciatica, unspecified side: Secondary | ICD-10-CM | POA: Diagnosis not present

## 2017-10-17 DIAGNOSIS — I2510 Atherosclerotic heart disease of native coronary artery without angina pectoris: Secondary | ICD-10-CM | POA: Diagnosis not present

## 2017-10-17 DIAGNOSIS — N183 Chronic kidney disease, stage 3 (moderate): Secondary | ICD-10-CM | POA: Diagnosis not present

## 2017-10-20 NOTE — Progress Notes (Signed)
Internal Medicine Clinic Attending  Case discussed with Dr. Huang at the time of the visit.  We reviewed the resident's history and exam and pertinent patient test results.  I agree with the assessment, diagnosis, and plan of care documented in the resident's note. 

## 2017-10-21 ENCOUNTER — Other Ambulatory Visit: Payer: Self-pay | Admitting: *Deleted

## 2017-10-21 ENCOUNTER — Telehealth: Payer: Self-pay | Admitting: *Deleted

## 2017-10-21 ENCOUNTER — Other Ambulatory Visit: Payer: Self-pay | Admitting: Adult Health

## 2017-10-21 DIAGNOSIS — I2510 Atherosclerotic heart disease of native coronary artery without angina pectoris: Secondary | ICD-10-CM

## 2017-10-21 DIAGNOSIS — I5030 Unspecified diastolic (congestive) heart failure: Secondary | ICD-10-CM

## 2017-10-21 DIAGNOSIS — E785 Hyperlipidemia, unspecified: Secondary | ICD-10-CM

## 2017-10-21 DIAGNOSIS — I5032 Chronic diastolic (congestive) heart failure: Secondary | ICD-10-CM | POA: Diagnosis not present

## 2017-10-21 DIAGNOSIS — N183 Chronic kidney disease, stage 3 (moderate): Secondary | ICD-10-CM | POA: Diagnosis not present

## 2017-10-21 DIAGNOSIS — M7062 Trochanteric bursitis, left hip: Secondary | ICD-10-CM | POA: Diagnosis not present

## 2017-10-21 DIAGNOSIS — M5440 Lumbago with sciatica, unspecified side: Secondary | ICD-10-CM | POA: Diagnosis not present

## 2017-10-21 DIAGNOSIS — I130 Hypertensive heart and chronic kidney disease with heart failure and stage 1 through stage 4 chronic kidney disease, or unspecified chronic kidney disease: Secondary | ICD-10-CM | POA: Diagnosis not present

## 2017-10-21 DIAGNOSIS — I10 Essential (primary) hypertension: Secondary | ICD-10-CM

## 2017-10-21 MED ORDER — BISOPROLOL FUMARATE 5 MG PO TABS: 2.5000 mg | ORAL_TABLET | Freq: Every day | ORAL | 1 refills | Status: DC

## 2017-10-21 MED ORDER — ALBUTEROL SULFATE HFA 108 (90 BASE) MCG/ACT IN AERS: 1.0000 | INHALATION_SPRAY | Freq: Four times a day (QID) | RESPIRATORY_TRACT | 2 refills | Status: DC | PRN

## 2017-10-21 MED ORDER — ISOSORBIDE MONONITRATE ER 30 MG PO TB24: 30.0000 mg | ORAL_TABLET | Freq: Every day | ORAL | 1 refills | Status: DC

## 2017-10-21 MED ORDER — ASPIRIN EC 81 MG PO TBEC: 81.0000 mg | DELAYED_RELEASE_TABLET | Freq: Every day | ORAL | 3 refills | Status: DC

## 2017-10-21 MED ORDER — ATORVASTATIN CALCIUM 40 MG PO TABS: 40.0000 mg | ORAL_TABLET | Freq: Every day | ORAL | 3 refills | Status: DC

## 2017-10-21 NOTE — Telephone Encounter (Signed)
Neighbor, Delorise Shiner, and patient called in asking for med refills. Med rec done with AVS from [facility] with PCP on 10/16/2017. Hubbard Hartshorn, RN, BSN

## 2017-10-21 NOTE — Telephone Encounter (Signed)
HHN calls and states [facility] services will end Thursday, pt has refused HH PT. This is a Pharmacist, hospital

## 2017-10-23 ENCOUNTER — Telehealth: Payer: Self-pay | Admitting: Internal Medicine

## 2017-10-23 DIAGNOSIS — N183 Chronic kidney disease, stage 3 (moderate): Secondary | ICD-10-CM | POA: Diagnosis not present

## 2017-10-23 DIAGNOSIS — I2510 Atherosclerotic heart disease of native coronary artery without angina pectoris: Secondary | ICD-10-CM | POA: Diagnosis not present

## 2017-10-23 DIAGNOSIS — I5032 Chronic diastolic (congestive) heart failure: Secondary | ICD-10-CM | POA: Diagnosis not present

## 2017-10-23 DIAGNOSIS — I130 Hypertensive heart and chronic kidney disease with heart failure and stage 1 through stage 4 chronic kidney disease, or unspecified chronic kidney disease: Secondary | ICD-10-CM | POA: Diagnosis not present

## 2017-10-23 DIAGNOSIS — M5440 Lumbago with sciatica, unspecified side: Secondary | ICD-10-CM | POA: Diagnosis not present

## 2017-10-23 DIAGNOSIS — M7062 Trochanteric bursitis, left hip: Secondary | ICD-10-CM | POA: Diagnosis not present

## 2017-10-23 NOTE — Telephone Encounter (Signed)
Patient is becoming discharge and patient has been doing well

## 2017-10-23 NOTE — Telephone Encounter (Signed)
Pt will be discharged from [facility] today, doing well, she is taking meds, eating, doesn't understand to use her inhaler before calling 911, she stated to [facility] they told me to always call 911 when im short of breath, ems leaves a note for [facility] when they have been to see pt.  This is just a [place]

## 2017-11-05 ENCOUNTER — Other Ambulatory Visit: Payer: Self-pay | Admitting: *Deleted

## 2017-11-05 NOTE — Patient Outreach (Signed)
Jane [name]) Care Management  11/05/2017  Jane [name] 05/18/1929 [id number]  Jane [name] is an 84 year old female patient with past medical history which includes hyperlipidemia, PAD (peripheral artery disease), ectropion of lower left eyelid, physical deconditioning, and CKD (chronic kidney disease). Jane [name] was most recently discharged from the hospital on 09/13/17 after a 2 day stay for shortness of breath.   Jane [name] was seen at the internal medicine clinic on 09/23/17 and referred to [facility] for assistance with follow up on "COPD/Pneumonia" and medication adherence concerns.  In Home Care Services - Jane [name] was receiving HHRN visits and HHPT/OT to address issues around deconditioning. She was discharged from home health services on 10/21/17. Jane [name] and her family were advised to consider assisted living placement vs PACE @ hospital d/c but declined both. We also discussed level of care options in follow up by phone and she continues to decline. Jane [name] reports doing her prescribed exercises and using her walker at home. We have discussed home safety precautions with her to include fall risk precautions. She denies having had any falls in the last 12 months.   Chronic Health Conditions (CHF/ Pulmonary Health Conditions)- Jane [name] denies having any difficulty with her breathing. She is weighing daily and today says she weighed 159 this morning before she had breakfast. Jane [name] knows she is to call her doctor for weight gain of 3lb overnight or 5lb in a week or if she experiences swelling or increased/worsened shortness of breath. She is able to confirm appropriate use of her prescribed inhalers. We reviewed signs and symptoms of worsening pulmonary health and/or symptoms of volume overload and when she should call her provider vs call EMS.   Provider Appointments:  Ophthalmology Dr. Katy Fitch: [phone];  [address],  [place], [place] [address] - Jane [name] says she isn't sure about the date and time of her upcoming appointment but will call Dr. Zenia Resides office on Monday.   Plan: Jane [name] is attending her provider appointments as scheduled, taking medications as prescribed, and has declined further intervention regarding level of care and home care vs options like PACE. She agreed to transition to monthly outreach calls via our [employer] program for ongoing assistance with her long term health needs. I will transition Jane [name] to our health coaching program.   [name] CM Care Plan Problem One     Most Recent Value  Care Plan Problem One  Long Term Self Health Management and Health Coaching Needs  Role Documenting the Problem One  Care Management Telephonic Coordinator  Care Plan for Problem One  Active  [facility] Long Term Goal   Over the next 60 days, patient will continue self health management activities  [place] Goal Start Date  11/05/17  [name] Long Term Goal Met Date    Interventions for Problem One Long Term Goal  reviewed prescribed self health management activities including medicaiton adherence, self health monitoring, and appointment adherence  [facility] CM Short Term Goal #1   Over the next 30 days, patient will attend all scheduled provider appointments  [facility] CM Short Term Goal #1 Start Date  11/05/17  [facility] CM Short Term Goal #1 Met Date    Interventions for Short Term Goal #1  reviewed upcoming scheduled appointment and transportation needs  [name] CM Short Term Goal #2   Over the next 30 days, patient will maintain self monitoring activities as evidenced by ability to  report self monitoring outcomges  [facility] CM Short Term Goal #2 Start Date  11/05/17  Interventions for Short Term Goal #2  reviewed recommended self health monitoring activities  [facility] CM Short Term Goal #3  Over the next 30 days, patient will take medications as  prescribed and contact pharmacy for refills 7 days prior to need  [name] CM Short  Term Goal #3 Start Date  11/05/17  Interventions for Short Tern Goal #3  reviewed medications and reocmmendation to contact pharmacy 7 days prior to need for refills      [place] [facility]  [phone]         I offered to make a home visit to Jane [name] to follow up on her progress

## 2017-11-25 ENCOUNTER — Other Ambulatory Visit: Payer: Self-pay | Admitting: Internal Medicine

## 2017-11-25 DIAGNOSIS — I5030 Unspecified diastolic (congestive) heart failure: Secondary | ICD-10-CM

## 2017-12-04 ENCOUNTER — Ambulatory Visit: Payer: Self-pay | Admitting: *Deleted

## 2017-12-05 ENCOUNTER — Other Ambulatory Visit: Payer: Self-pay | Admitting: *Deleted

## 2017-12-05 NOTE — Patient Outreach (Signed)
[facility]) Care Management  12/05/2017  [name] 1929/01/31 [id number]  [name] is an 84 year old female patient with past medical history which includes hyperlipidemia, PAD (peripheral artery disease), ectropion of lower left eyelid, physical deconditioning, and CKD (chronic kidney disease). [name] was most recently discharged from the hospital on 09/13/17 after a 2 day stay for shortness of breath.   [name] was seen at the internal medicine clinic on 09/23/17 and referred to [facility] for assistance with follow up on "COPD/Pneumonia" and medication adherence concerns.  [name] shared with me today when I called that her eldest brother died last week. [name] is the last remaining of 9 children born to her parents. Supportive listening provided.   Chronic Health Conditions (CHF/ Pulmonary Health Conditions)- Mrs. Dockerydenies having any difficulty with her breathing today. She says "every once in a while" she experiences shortness of breath but it usually subsides with rest. Sheis weighingdaily and today says she weighed 158 this morning before she had breakfast. [name] knows she is to call her doctor for weight gain of 3lb overnight or 5lb in a week or if she experiences swelling or increased/worsened shortness of breath.She is able to confirm appropriate use of her prescribed inhalers. We reviewed signs and symptoms of worsening pulmonary health and/or symptoms of volume overload and when she should call her provider vs call EMS.  Provider Appointments:  Ophthalmology Dr. Katy Fitch: [phone];  [address], [place], [place] [address] - [name] says she hasn't called  sure about the date and time of her upcoming appointment but will call Dr. Zenia [name] office on Monday.   PCP Dr. Colbert [name] - [name] says she is to return to the clinic on December 31 2017. [name] has a ride to the clinic (nephew) and has  the number to the clinic should she need to change her appointment.    Plan: [name] is attending her provider appointments as scheduled, taking medications as prescribed, and has declined further intervention regarding level of care and home care vs options like PACE. She agreed to transition to monthly outreach calls via our [employer] program for ongoing assistance with her long term health needs. I will transition [name] to our health coaching program.   [facility] CM Care Plan Problem One     Most Recent Value  Care Plan Problem One  Long Term Self Health Management and Health Coaching Needs  Role Documenting the Problem One  Care Management Telephonic Coordinator  Care Plan for Problem One  Active  [facility] Long Term Goal   Over the next 60 days, patient will continue self health management activities  [place] Goal Start Date  11/05/17  Interventions for Problem One Long Term Goal  assessed self health management activities, reviewed current recommendations for self health management  [facility] CM Short Term Goal #1   Over the next 30 days, patient will attend all scheduled provider appointments  [facility] CM Short Term Goal #1 Start Date  12/05/17  Interventions for Short Term Goal #1  reviewed upcoming provider appointment schedule and ensured transportation  [facility] CM Short Term Goal #2   Over the next 30 days, patient will maintain self monitoring activities as evidenced by ability to report self monitoring outcomges  [facility] CM Short Term Goal #2 Start Date  11/05/17  [facility] CM Short Term Goal #2 Met Date  12/05/17  Interventions for Short  Term Goal #2  reviewed patient data from self health monitoring,  encouraged continued surveillance  [facility] CM Short Term Goal #3  Over the next 30 days, patient will take medications as  prescribed and contact pharmacy for refills 7 days prior to need  [facility] CM Short Term Goal #3 Start Date  11/05/17  [facility] CM Short Term Goal #3 Met Date  12/05/17  Interventions  for Short Tern Goal #3  patient reports adherene to prescribed medication regimen,  reviewed necessity for ongoing adherence  [facility] CM Short Term Goal #4  over the next 30 days, patient will verbalize sigsn and symptoms for which she should call provider/report  [facility] CM Short Term Goal #4 Start Date  12/05/17  Interventions for Short Term Goal #4  reviewed signs and symptoms for which she should contact provider      [facility]  [phone]

## 2017-12-21 ENCOUNTER — Encounter: Payer: Self-pay | Admitting: *Deleted

## 2018-01-14 ENCOUNTER — Other Ambulatory Visit: Payer: Self-pay | Admitting: Internal Medicine

## 2018-01-14 ENCOUNTER — Other Ambulatory Visit: Payer: Self-pay | Admitting: Adult Health

## 2018-01-14 DIAGNOSIS — I5030 Unspecified diastolic (congestive) heart failure: Secondary | ICD-10-CM

## 2018-01-14 DIAGNOSIS — I10 Essential (primary) hypertension: Secondary | ICD-10-CM

## 2018-01-14 NOTE — Telephone Encounter (Signed)
Called pharm, gave them script from 08/2017 it never went to pharm, not set at normal was set no print. They will fill and call pt

## 2018-01-14 NOTE — Telephone Encounter (Signed)
Refill Request pt has 1 pill left   losartan (COZAAR) 100 MG tablet

## 2018-01-25 ENCOUNTER — Emergency Department (HOSPITAL_COMMUNITY): Payer: Medicare Other

## 2018-01-25 ENCOUNTER — Emergency Department (HOSPITAL_COMMUNITY)
Admission: EM | Admit: 2018-01-25 | Discharge: 2018-01-25 | Disposition: A | Discharge status: Home / Self Care | Payer: Medicare Other | Attending: Emergency Medicine | Admitting: Emergency Medicine

## 2018-01-25 ENCOUNTER — Encounter (HOSPITAL_COMMUNITY): Payer: Self-pay | Admitting: Nurse Practitioner

## 2018-01-25 DIAGNOSIS — N183 Chronic kidney disease, stage 3 (moderate): Secondary | ICD-10-CM | POA: Insufficient documentation

## 2018-01-25 DIAGNOSIS — Z8673 Personal history of transient ischemic attack (TIA), and cerebral infarction without residual deficits: Secondary | ICD-10-CM | POA: Diagnosis not present

## 2018-01-25 DIAGNOSIS — Z7982 Long term (current) use of aspirin: Secondary | ICD-10-CM | POA: Diagnosis not present

## 2018-01-25 DIAGNOSIS — I509 Heart failure, unspecified: Secondary | ICD-10-CM | POA: Insufficient documentation

## 2018-01-25 DIAGNOSIS — R51 Headache: Secondary | ICD-10-CM | POA: Diagnosis not present

## 2018-01-25 DIAGNOSIS — I2510 Atherosclerotic heart disease of native coronary artery without angina pectoris: Secondary | ICD-10-CM | POA: Insufficient documentation

## 2018-01-25 DIAGNOSIS — R519 Headache, unspecified: Secondary | ICD-10-CM

## 2018-01-25 DIAGNOSIS — I130 Hypertensive heart and chronic kidney disease with heart failure and stage 1 through stage 4 chronic kidney disease, or unspecified chronic kidney disease: Secondary | ICD-10-CM | POA: Diagnosis not present

## 2018-01-25 DIAGNOSIS — Z87891 Personal history of nicotine dependence: Secondary | ICD-10-CM | POA: Insufficient documentation

## 2018-01-25 DIAGNOSIS — Z79899 Other long term (current) drug therapy: Secondary | ICD-10-CM | POA: Diagnosis not present

## 2018-01-25 DIAGNOSIS — I714 Abdominal aortic aneurysm, without rupture: Secondary | ICD-10-CM | POA: Diagnosis not present

## 2018-01-25 DIAGNOSIS — L989 Disorder of the skin and subcutaneous tissue, unspecified: Secondary | ICD-10-CM | POA: Diagnosis not present

## 2018-01-25 MED ORDER — ACETAMINOPHEN 325 MG PO TABS
650.0000 mg | ORAL_TABLET | Freq: Once | ORAL | Status: AC
  Administered 2018-01-25: 650 mg via ORAL
  Filled 2018-01-25: qty 2

## 2018-01-25 NOTE — Discharge Instructions (Addendum)
It was our pleasure to provide your ER care today - we hope that you feel better.  Your head ct scan looks good.  Take acetaminophen and/or ibuprofen as need for pain.   For skin lesion to scalp - follow up with your primary care doctor in the next couple weeks as planned - discuss possible referral to dermatologist then.   Follow up with primary care doctor in 1 week if symptoms fail to improve/resolve.

## 2018-01-25 NOTE — ED Notes (Signed)
Patient transported to CT 

## 2018-01-25 NOTE — ED Provider Notes (Addendum)
[facility] DEPT Provider Note   CSN: [id number] Arrival date & time: 01/25/18  1520     History   Chief Complaint Chief Complaint  Patient presents with  . Headache    HPI Jane [name] is a 84 y.o. female.  Pt c/o headache for the past 2 days. Headache is located superiorly, constant, dull, moderate. Not severe at onset, slowly worse. No specific exacerbating or alleviating factors. Denies hx migraines. No recent head injury, trauma or fall. No syncope. No eye pain or change in vision. No numbness/weakness, no change in speech, no change in coordination or balance. States occasionally headache in past but very unusual for her to have headache for 2 days. Denies fever or chills. No vomiting.   The history is provided by the patient.  Headache   Pertinent negatives include no fever, no shortness of breath and no vomiting.    Past Medical History:  Diagnosis Date  . Anemia   . Aortic insufficiency    mild  . Arthritis    "arms" (08/09/2015)  . CAD (coronary artery disease)    cath 08/09/2014 95% stenosis in prox to mid RCA s/p DES, 80-90% prox OM2, 50% distal LAD  . CHF (congestive heart failure) ([place])   . CKD (chronic kidney disease) stage 3, GFR 30-59 ml/min (HCC)   . Colon polyp    2009 colonoscopy, not retrieved for pathology  . Dyspnea   . Ectropion of left lower eyelid   . GERD (gastroesophageal reflux disease) 2009   EGD with benign gastric polyp too  . Hyperlipidemia   . Hypertension   . Pneumonia    history of  . Stroke ([place]) 1975  . Vitamin D deficiency     Patient Active Problem List   Diagnosis Date Noted  . CAD (coronary artery disease) 08/25/2017  . Unsteady gait 08/11/2016  . Abdominal aortic aneurysm ([place]) 05/21/2016  . Trochanteric bursitis of left hip   . CKD (chronic kidney disease) 03/31/2015  . Prediabetes 10/16/2014  . Status post insertion of drug-eluting stent into right coronary artery for coronary artery  disease 08/07/2014  . Dyspnea 05/31/2014  . Physical deconditioning 05/31/2014  . Heart failure with preserved ejection fraction ([place]) 12/27/2012  . Ectropion of left lower eyelid 12/17/2012  . Onychomycosis of toenail 09/18/2012  . PAD (peripheral artery disease) ([place]) 09/18/2012  . Preventative health care 09/18/2012  . Vitamin D deficiency 04/25/2008  . C V A / STROKE 10/29/2007  . Hyperlipidemia 09/24/2006  . Normocytic anemia 09/24/2006  . Hypertension 09/24/2006  . OSTEOPENIA 09/24/2006  . Vitamin B12 deficiency 08/29/2006    Past Surgical History:  Procedure Laterality Date  . ABDOMINAL HYSTERECTOMY    . APPENDECTOMY    . ARTERY BIOPSY Left 12/16/2012   Procedure: BIOPSY TEMPORAL ARTERY;  Surgeon: Mal Misty, MD;  Location: [facility];  Service: Vascular;  Laterality: Left;  . CARDIAC CATHETERIZATION    . LEFT HEART CATHETERIZATION WITH CORONARY ANGIOGRAM N/A 08/09/2014   Procedure: LEFT HEART CATHETERIZATION WITH CORONARY ANGIOGRAM;  Surgeon: Peter M Martinique, MD;  Location: [facility] CATH LAB;  Service: Cardiovascular;  Laterality: N/A;  . PERCUTANEOUS CORONARY STENT INTERVENTION (PCI-S)  08/09/2014   Procedure: PERCUTANEOUS CORONARY STENT INTERVENTION (PCI-S);  Surgeon: Peter M Martinique, MD;  Location: [facility] CATH LAB;  Service: Cardiovascular;;  . TONSILLECTOMY       OB History   None      Home Medications    Prior to Admission medications  Medication Sig Start Date End Date Taking? Authorizing Provider  acetaminophen (TYLENOL) 325 MG tablet Take 650 mg by mouth every 6 (six) hours as needed for mild pain, moderate pain or headache.     [provider]  albuterol (PROVENTIL HFA;VENTOLIN HFA) 108 (90 Base) MCG/ACT inhaler Inhale 1-2 puffs into the lungs every 6 (six) hours as needed for wheezing or shortness of breath. 10/21/17   Colbert Ewing, MD  aspirin EC 81 MG tablet Take 1 tablet (81 mg total) by mouth daily. 10/21/17   Colbert Ewing, MD  atorvastatin (LIPITOR) 40  MG tablet Take 1 tablet (40 mg total) by mouth daily at 6 PM. 10/21/17   Colbert Ewing, MD  bisoprolol (ZEBETA) 5 MG tablet Take 0.5 tablets (2.5 mg total) by mouth daily. 10/21/17   Colbert Ewing, MD  Emollient (LUBRIDERM DAILY MOISTURE) LOTN Apply 1 application topically 2 (two) times daily. Apply topically to dry exfoliative areas of legs    [provider]  furosemide (LASIX) 20 MG tablet Take 1 tablet (20 mg total) by mouth daily. 11/25/17   Colbert Ewing, MD  Hypromellose (NATURAL BALANCE TEARS OP) Apply 1 drop to eye 3 (three) times daily.    [provider]  isosorbide mononitrate (IMDUR) 30 MG 24 hr tablet Take 1 tablet (30 mg total) by mouth daily. 10/21/17   Colbert Ewing, MD  losartan (COZAAR) 100 MG tablet Take 1 tablet (100 mg total) by mouth daily. 09/23/17 09/23/18  Jule Ser, DO    Family History Family History  Problem Relation Age of Onset  . Stroke Mother   . Hypertension Mother   . Stroke Father   . Hypertension Father   . Diabetes Sister     Social History Social History   Tobacco Use  . Smoking status: Former Smoker    Types: Cigarettes    Last attempt to quit: 07/02/1979    Years since quitting: 38.5  . Smokeless tobacco: Former Systems developer  . Tobacco comment: Started in teenage years - Quit 1980  Substance Use Topics  . Alcohol use: No    Alcohol/week: 0.0 oz    Comment: Quit alcohol 1975-76  . Drug use: No     Allergies   Patient has no known allergies.   Review of Systems Review of Systems  Constitutional: Negative for fever.  HENT: Negative for rhinorrhea and sinus pain.   Eyes: Negative for redness.  Respiratory: Negative for shortness of breath.   Cardiovascular: Negative for chest pain.  Gastrointestinal: Negative for abdominal pain and vomiting.  Genitourinary: Negative for flank pain.  Musculoskeletal: Negative for neck pain and neck stiffness.  Skin: Negative for rash.  Neurological: Positive for headaches.  Negative for syncope, speech difficulty, weakness and numbness.  Hematological: Does not bruise/bleed easily.  Psychiatric/Behavioral: Negative for confusion.     Physical Exam Updated Vital Signs BP (!) 172/54 (BP Location: Right Arm)   Pulse 72   Temp 98.2 F (36.8 C) (Oral)   Resp 18   SpO2 97%   Physical Exam  Constitutional: She is oriented to person, place, and time. She appears well-developed and well-nourished. No distress.  HENT:  Head: Atraumatic.  Nose: Nose normal.  Mouth/Throat: Oropharynx is clear and moist.  No sinus or temporal tenderness.  Eyes: Pupils are equal, round, and reactive to light. Conjunctivae and EOM are normal. No scleral icterus.  Neck: Neck supple. No tracheal deviation present. No thyromegaly present.  No stiffness or rigidity.   Cardiovascular: Normal rate, regular  rhythm, normal heart sounds and intact distal pulses. Exam reveals no gallop and no friction rub.  No murmur heard. Pulmonary/Chest: Effort normal and breath sounds normal. No respiratory distress.  Abdominal: Soft. Normal appearance and bowel sounds are normal. She exhibits no distension. There is no tenderness.  Genitourinary:  Genitourinary Comments: No cva tenderness.  Musculoskeletal: Normal range of motion. She exhibits no edema or tenderness.  Neurological: She is alert and oriented to person, place, and time. No cranial nerve deficit.  Speech clear/fluent. Motor intact bilaterally, stre 5/5. No pronator drift. sens grossly intact. Steady gait.   Skin: Skin is warm and dry. No rash noted. She is not diaphoretic.  Pt with dry scaly skin to scalp, and area scaly thickened hypertrophied tissue approx 1 cm diameter. No abscess. No purulent drainage.   Psychiatric: She has a normal mood and affect.  Nursing note and vitals reviewed.    ED Treatments / Results  Labs (all labs ordered are listed, but only abnormal results are displayed) Labs Reviewed - No data to  display  EKG None  Radiology Ct Head Wo Contrast  Result Date: 01/25/2018 CLINICAL DATA:  Headache EXAM: CT HEAD WITHOUT CONTRAST TECHNIQUE: Contiguous axial images were obtained from the base of the skull through the vertex without intravenous contrast. COMPARISON:  02/04/2016 FINDINGS: Brain: There is atrophy and chronic small vessel disease changes. No acute intracranial abnormality. Specifically, no hemorrhage, hydrocephalus, mass lesion, acute infarction, or significant intracranial injury. Vascular: No hyperdense vessel or unexpected calcification. Skull: No acute calvarial abnormality. Sinuses/Orbits: Visualized paranasal sinuses and mastoids clear. Orbital soft tissues unremarkable. Other: None IMPRESSION: No acute intracranial abnormality. Atrophy, chronic microvascular disease. Electronically Signed   By: Rolm Baptise M.D.   On: 01/25/2018 18:49    Procedures Procedures (including critical care time)  Medications Ordered in ED Medications  acetaminophen (TYLENOL) tablet 650 mg (has no administration in time range)     Initial Impression / Assessment and Plan / ED Course  I have reviewed the triage vital signs and the nursing notes.  Pertinent labs & imaging results that were available during my care of the patient were reviewed by me and considered in my medical decision making (see chart for details).  Ct imaging as headache prolonged and very unusual for patient.  Reviewed nursing notes and prior charts for additional history.   Acetaminophen po.  Ct reviewed - neg acute.   Discussed skin lesion to scalp - will have f/u pcp/derm. Pt indicates has appt in coming couple weeks w pcp.  Recheck pain resolved.   Recheck pt appears stable for d/c.     Final Clinical Impressions(s) / ED Diagnoses   Final diagnoses:  None    ED Discharge Orders    None          Lajean Saver, MD 01/25/18 1929

## 2018-01-25 NOTE — ED Triage Notes (Signed)
Pt is presented by medics for evaluation of her headache that has been ongoing for the last 2 days. She states that he PCP told her to take tylenol and motrin for it and "not to take her blood pressure medicine?"

## 2018-02-19 ENCOUNTER — Encounter: Payer: Self-pay | Admitting: Internal Medicine

## 2018-03-04 ENCOUNTER — Other Ambulatory Visit: Payer: Self-pay | Admitting: Internal Medicine

## 2018-03-04 DIAGNOSIS — I5030 Unspecified diastolic (congestive) heart failure: Secondary | ICD-10-CM

## 2018-03-04 DIAGNOSIS — I2510 Atherosclerotic heart disease of native coronary artery without angina pectoris: Secondary | ICD-10-CM

## 2018-03-04 MED ORDER — FUROSEMIDE 20 MG PO TABS: 20.0000 mg | ORAL_TABLET | Freq: Every day | ORAL | 0 refills | Status: DC

## 2018-03-04 MED ORDER — ISOSORBIDE MONONITRATE ER 30 MG PO TB24: 30.0000 mg | ORAL_TABLET | Freq: Every day | ORAL | 1 refills | Status: DC

## 2018-03-04 NOTE — Telephone Encounter (Signed)
Refill request -Patient only has 1 pill left of  Both medications  isosorbide mononitrate (IMDUR) 30 MG 24 hr tablet  furosemide (LASIX) 20 MG tablet

## 2018-03-04 NOTE — Telephone Encounter (Signed)
Next appt scheduled 9/19 with PCP. 

## 2018-03-19 ENCOUNTER — Other Ambulatory Visit: Payer: Self-pay

## 2018-03-19 ENCOUNTER — Encounter: Payer: Self-pay | Admitting: Internal Medicine

## 2018-03-19 ENCOUNTER — Ambulatory Visit (INDEPENDENT_AMBULATORY_CARE_PROVIDER_SITE_OTHER): Payer: Medicare Other | Admitting: Internal Medicine

## 2018-03-19 VITALS — BP 168/65 | HR 72 | Temp 98.0°F | Ht 64.0 in | Wt 155.0 lb

## 2018-03-19 DIAGNOSIS — Z79899 Other long term (current) drug therapy: Secondary | ICD-10-CM

## 2018-03-19 DIAGNOSIS — Z87891 Personal history of nicotine dependence: Secondary | ICD-10-CM | POA: Diagnosis not present

## 2018-03-19 DIAGNOSIS — L608 Other nail disorders: Secondary | ICD-10-CM | POA: Diagnosis not present

## 2018-03-19 DIAGNOSIS — H02105 Unspecified ectropion of left lower eyelid: Secondary | ICD-10-CM | POA: Diagnosis not present

## 2018-03-19 DIAGNOSIS — H538 Other visual disturbances: Secondary | ICD-10-CM

## 2018-03-19 DIAGNOSIS — I10 Essential (primary) hypertension: Secondary | ICD-10-CM

## 2018-03-19 DIAGNOSIS — L989 Disorder of the skin and subcutaneous tissue, unspecified: Secondary | ICD-10-CM | POA: Diagnosis not present

## 2018-03-19 DIAGNOSIS — Z7982 Long term (current) use of aspirin: Secondary | ICD-10-CM

## 2018-03-19 DIAGNOSIS — L602 Onychogryphosis: Secondary | ICD-10-CM

## 2018-03-19 DIAGNOSIS — L821 Other seborrheic keratosis: Secondary | ICD-10-CM

## 2018-03-19 DIAGNOSIS — B351 Tinea unguium: Secondary | ICD-10-CM

## 2018-03-19 NOTE — Assessment & Plan Note (Signed)
BP today: 168/65.  Patient is on losartan 100 mg daily, bisoprolol 2.5 mg daily, Lasix 20 mg daily and Imdur 30 mg daily.  Currently asymptomatic.  Recommend to watch the amount of salt in her food and avoid salty food. I will continue current dose of medications and check her BP on next follow-up in 2-3 months. -Continue losartan 100 mg daily, bisoprolol 2.5 mg daily, Lasix 20 mg daily and Imdure 30 mg QD -Follow up in clinic in 2-3 months

## 2018-03-19 NOTE — Assessment & Plan Note (Signed)
Patient reports some worsening of left lower eyelid ectropion with blurred vision. Currently no discharge or other sign on infection. Vision is ok on exam.   Tx at this point is continuing artifical eye drop, how ever I put a new referral for Dr. Katy Fitch as she complains of some worsening in her condition and since she has missed her previous appointment with ophthalmologist Dr. Katy Fitch. -Continue artificial tear -Refferal to ophthalmology, Dr. Katy Fitch

## 2018-03-19 NOTE — Assessment & Plan Note (Addendum)
Patient has skin lesion on site of previous laceration on her scalp. Has waxy appearance Non tender, one of the lesions is about 1.5x1.5 inches.  Likely to be seborrheic keratosis.(image is present in media) Dermatology evaluation  is recommended -Refferal to dermatology

## 2018-03-19 NOTE — Assessment & Plan Note (Addendum)
Toenail deformity and hypertrophy Seems to be chronic. Has been seen by podiatrist before for clipping. No discoloration. No skin involvement. Refer to podiatrist to evaluate for onchomycosis -referral to Podiatrist, Dr Paulla Dolly

## 2018-03-19 NOTE — Progress Notes (Signed)
   CC: Follow up, scalp lesion  HPI:  [name] is a 84 y.o. with PMHx. as listed below, presented with scalp lesion, lower eye lid turn outward and red. Please see encounter base assessment and plan for more detail  Past Medical History:  Diagnosis Date  . Anemia   . Aortic insufficiency    mild  . Arthritis    "arms" (08/09/2015)  . CAD (coronary artery disease)    cath 08/09/2014 95% stenosis in prox to mid RCA s/p DES, 80-90% prox OM2, 50% distal LAD  . CHF (congestive heart failure) ([place])   . CKD (chronic kidney disease) stage 3, GFR 30-59 ml/min (HCC)   . Colon polyp    2009 colonoscopy, not retrieved for pathology  . Dyspnea   . Ectropion of left lower eyelid   . GERD (gastroesophageal reflux disease) 2009   EGD with benign gastric polyp too  . Hyperlipidemia   . Hypertension   . Pneumonia    history of  . Stroke ([place]) 1975  . Vitamin D deficiency    Family Hx: Stroke in mother HTN in mother Stroke in father DM in father  Social Hx:  Currently Non smoker  No Alcohol No drug  Review of Systems: Review of Systems  Constitutional: Negative for fever.  Eyes: Positive for blurred vision. Negative for discharge and redness.  Respiratory: Negative for cough and shortness of breath.   Cardiovascular: Negative for chest pain and leg swelling.    Physical Exam:  Vitals:   03/19/18 1506  BP: (!) 168/65  Pulse: 72  Temp: 98 F (36.7 C)  TempSrc: Oral  SpO2: 98%  Weight: 155 lb (70.3 kg)  Height: 5\' 4"  (1.626 m)   Physical Exam   Constitutional: She appears well-developed. Alert and oriented. In No distress.  HENT:  Head: seborrheic keratosis like lesions on scalp Eyes: left lower lid ectropion. No discharge Cardiovascular: Normal rate, regular rhythm and normal heart sounds.  Respiratory: Effort normal and breath sounds normal. No respiratory distress. She has no wheezes.  GI: Soft. Non tender.  Musculoskeletal: Toe nail hypertrophy. She exhibits  no edema.   .seen with Dr. Beryle Beams .

## 2018-03-19 NOTE — Patient Instructions (Signed)
It was our pleasure taking care of you in our clinic today. You were complaining of some skin lesion on your head as well as redness and swelling inside your lower left eye lid, as well as toe nail overgrowth. Your eye lid redness does not look infected and I recommend to continue artifical tear drop. How ever you had missed your eye doctor appointment before, so I refer you again for additional evaluation. Your scalp lesion and toe nail overgrowth looks benign but may need to be scraped due to thickness. I put the referral to skin doctor (dermatologist) and podiatrist.  Your blood pressure was elevated today. Please make sure you take all of your medications as instructed when you go home. I will check your blood pressure on next visit.  We also talked about you have some problem for cutting your medications, I recommend to get a Pill splitter from your pharmacy.  Please let us know if you have any question and see me in 2-3 months or earlier as needed.  Thank you Dr. Linna Hoff

## 2018-03-20 ENCOUNTER — Encounter: Payer: Self-pay | Admitting: Internal Medicine

## 2018-03-20 NOTE — Progress Notes (Signed)
Medicine attending: I personally interviewed and briefly examined this patient on the day of the patient visit and reviewed pertinent clinical ,laboratory & radiographic data  with resident physician Dr. Linna Hoff and we discussed a management plan. Borderline elevation of blood pressure for age. Will not change meds today. "CHF" recorded in chart but just grade 1 diastolic dysfunction on 3/01 echo. No sign of chronic myocardial injury on most recent EKG. Borderline cardiomegaly on most recent CXR. Chronic ectropion OS. No active infection. Refer back to her eye MD. Overgrowth toenails. Referred back to her podiatrist.

## 2018-03-31 ENCOUNTER — Encounter: Payer: Self-pay | Admitting: *Deleted

## 2018-04-24 DIAGNOSIS — L814 Other melanin hyperpigmentation: Secondary | ICD-10-CM | POA: Diagnosis not present

## 2018-04-24 DIAGNOSIS — L821 Other seborrheic keratosis: Secondary | ICD-10-CM | POA: Diagnosis not present

## 2018-04-24 DIAGNOSIS — L820 Inflamed seborrheic keratosis: Secondary | ICD-10-CM | POA: Diagnosis not present

## 2018-04-28 NOTE — Addendum Note (Signed)
Addended by: Hulan Fray on: 04/28/2018 03:22 PM   Modules accepted: Orders

## 2018-06-11 ENCOUNTER — Encounter: Payer: Self-pay | Admitting: Internal Medicine

## 2018-06-15 ENCOUNTER — Other Ambulatory Visit: Payer: Self-pay | Admitting: Internal Medicine

## 2018-06-15 DIAGNOSIS — I5030 Unspecified diastolic (congestive) heart failure: Secondary | ICD-10-CM

## 2018-06-15 MED ORDER — FUROSEMIDE 20 MG PO TABS: 20.0000 mg | ORAL_TABLET | Freq: Every day | ORAL | 0 refills | Status: DC

## 2018-06-15 NOTE — Telephone Encounter (Signed)
Refill Request   furosemide (LASIX) 20 MG tablet  [id number] - [place], [place] AT [address]

## 2018-06-21 ENCOUNTER — Other Ambulatory Visit: Payer: Self-pay

## 2018-06-21 ENCOUNTER — Encounter (HOSPITAL_COMMUNITY): Payer: Self-pay

## 2018-06-21 ENCOUNTER — Emergency Department (HOSPITAL_COMMUNITY)
Admission: EM | Admit: 2018-06-21 | Discharge: 2018-06-21 | Disposition: A | Discharge status: Home / Self Care | Payer: Medicare Other | Attending: Emergency Medicine | Admitting: Emergency Medicine

## 2018-06-21 DIAGNOSIS — Z7982 Long term (current) use of aspirin: Secondary | ICD-10-CM | POA: Diagnosis not present

## 2018-06-21 DIAGNOSIS — Y9389 Activity, other specified: Secondary | ICD-10-CM | POA: Insufficient documentation

## 2018-06-21 DIAGNOSIS — S50311A Abrasion of right elbow, initial encounter: Secondary | ICD-10-CM | POA: Diagnosis not present

## 2018-06-21 DIAGNOSIS — I2510 Atherosclerotic heart disease of native coronary artery without angina pectoris: Secondary | ICD-10-CM | POA: Insufficient documentation

## 2018-06-21 DIAGNOSIS — W19XXXA Unspecified fall, initial encounter: Secondary | ICD-10-CM

## 2018-06-21 DIAGNOSIS — Z8673 Personal history of transient ischemic attack (TIA), and cerebral infarction without residual deficits: Secondary | ICD-10-CM | POA: Insufficient documentation

## 2018-06-21 DIAGNOSIS — Z87891 Personal history of nicotine dependence: Secondary | ICD-10-CM | POA: Insufficient documentation

## 2018-06-21 DIAGNOSIS — W010XXA Fall on same level from slipping, tripping and stumbling without subsequent striking against object, initial encounter: Secondary | ICD-10-CM | POA: Insufficient documentation

## 2018-06-21 DIAGNOSIS — Z23 Encounter for immunization: Secondary | ICD-10-CM | POA: Diagnosis not present

## 2018-06-21 DIAGNOSIS — Z79899 Other long term (current) drug therapy: Secondary | ICD-10-CM | POA: Insufficient documentation

## 2018-06-21 DIAGNOSIS — I10 Essential (primary) hypertension: Secondary | ICD-10-CM | POA: Diagnosis not present

## 2018-06-21 DIAGNOSIS — S51011A Laceration without foreign body of right elbow, initial encounter: Secondary | ICD-10-CM | POA: Insufficient documentation

## 2018-06-21 DIAGNOSIS — I509 Heart failure, unspecified: Secondary | ICD-10-CM | POA: Insufficient documentation

## 2018-06-21 DIAGNOSIS — T148XXA Other injury of unspecified body region, initial encounter: Secondary | ICD-10-CM | POA: Diagnosis not present

## 2018-06-21 DIAGNOSIS — Y998 Other external cause status: Secondary | ICD-10-CM | POA: Diagnosis not present

## 2018-06-21 DIAGNOSIS — I130 Hypertensive heart and chronic kidney disease with heart failure and stage 1 through stage 4 chronic kidney disease, or unspecified chronic kidney disease: Secondary | ICD-10-CM | POA: Diagnosis not present

## 2018-06-21 DIAGNOSIS — Y929 Unspecified place or not applicable: Secondary | ICD-10-CM | POA: Diagnosis not present

## 2018-06-21 DIAGNOSIS — N183 Chronic kidney disease, stage 3 (moderate): Secondary | ICD-10-CM | POA: Insufficient documentation

## 2018-06-21 MED ORDER — TETANUS-DIPHTH-ACELL PERTUSSIS 5-2.5-18.5 LF-MCG/0.5 IM SUSP
0.5000 mL | Freq: Once | INTRAMUSCULAR | Status: AC
  Administered 2018-06-21: 0.5 mL via INTRAMUSCULAR
  Filled 2018-06-21: qty 0.5

## 2018-06-21 NOTE — ED Provider Notes (Signed)
[facility] EMERGENCY DEPARTMENT Provider Note   CSN: [id number] Arrival date & time: 06/21/18  0755     History   Chief Complaint Chief Complaint  Patient presents with  . Fall  . Hypertension    HPI Jane [name] is a 84 y.o. female.  HPI   84 year old female with history below presents with concern for mechanical fall.  Patient reports this morning, she was going to get her phone, tripped over a cord on the floor.  Reports that she fell causing a skin tear to her right forearm, but denies hitting her head.  Denies head trauma, headache, loss of consciousness.  Denies neck pain, back pain, hip pain, numbness, weakness, chest pain, abdominal pain.  Reports that she has been in a normal state of health, denies fevers or other concerns.  She lives independently, cooks for herself.  Reports that she is had aides coming to the home before, but she felt that she could do more than what they could do for her.  Reports she does have difficulty taking care of her feet, trimming her toenails.  When she fell this morning, she was unable to stand up, reported crawling around on the floor, specifically crawling to the bedroom to get her keys, crawling back to the door, heat and yelling out for a neighbor until they heard her, and she slid the key underneath the door for them to come in.  The neighbor had called EMS.  EMS came and given her blood pressures on scene were 200/100, recommended evaluation in the emergency department.  Patient initially declined, however then agreed to evaluation.  She has no concerns other than skin tear on elbow.   Past Medical History:  Diagnosis Date  . Anemia   . Aortic insufficiency    mild  . Arthritis    "arms" (08/09/2015)  . CAD (coronary artery disease)    cath 08/09/2014 95% stenosis in prox to mid RCA s/p DES, 80-90% prox OM2, 50% distal LAD  . CHF (congestive heart failure) ([place])   . CKD (chronic kidney disease) stage 3, GFR 30-59  ml/min (HCC)   . Colon polyp    2009 colonoscopy, not retrieved for pathology  . Dyspnea   . Ectropion of left lower eyelid   . GERD (gastroesophageal reflux disease) 2009   EGD with benign gastric polyp too  . Hyperlipidemia   . Hypertension   . Pneumonia    history of  . Stroke ([place]) 1975  . Vitamin D deficiency     Patient Active Problem List   Diagnosis Date Noted  . Toenail deformity 03/19/2018  . Skin lesion of scalp 03/19/2018  . CAD (coronary artery disease) 08/25/2017  . Unsteady gait 08/11/2016  . Abdominal aortic aneurysm ([place]) 05/21/2016  . Trochanteric bursitis of left hip   . CKD (chronic kidney disease) 03/31/2015  . Prediabetes 10/16/2014  . Status post insertion of drug-eluting stent into right coronary artery for coronary artery disease 08/07/2014  . Dyspnea 05/31/2014  . Physical deconditioning 05/31/2014  . Heart failure with preserved ejection fraction ([place]) 12/27/2012  . Ectropion of left lower eyelid 12/17/2012  . Onychomycosis of toenail 09/18/2012  . PAD (peripheral artery disease) ([place]) 09/18/2012  . Preventative health care 09/18/2012  . Vitamin D deficiency 04/25/2008  . History of cerebrovascular accident 10/29/2007  . Hyperlipidemia 09/24/2006  . Normocytic anemia 09/24/2006  . Hypertension 09/24/2006  . OSTEOPENIA 09/24/2006  . Vitamin B12 deficiency 08/29/2006    Past  Surgical History:  Procedure Laterality Date  . ABDOMINAL HYSTERECTOMY    . APPENDECTOMY    . ARTERY BIOPSY Left 12/16/2012   Procedure: BIOPSY TEMPORAL ARTERY;  Surgeon: Mal Misty, MD;  Location: [facility];  Service: Vascular;  Laterality: Left;  . CARDIAC CATHETERIZATION    . LEFT HEART CATHETERIZATION WITH CORONARY ANGIOGRAM N/A 08/09/2014   Procedure: LEFT HEART CATHETERIZATION WITH CORONARY ANGIOGRAM;  Surgeon: Peter M Martinique, MD;  Location[facility] CATH LAB;  Service: Cardiovascular;  Laterality: N/A;  . PERCUTANEOUS CORONARY STENT INTERVENTION (PCI-S)  08/09/2014    Procedure: PERCUTANEOUS CORONARY STENT INTERVENTION (PCI-S);  Surgeon: Peter M Martinique, MD;  Location: [facility] CATH LAB;  Service: Cardiovascular;;  . TONSILLECTOMY       OB History   No obstetric history on file.      Home Medications    Prior to Admission medications   Medication Sig Start Date End Date Taking? Authorizing Provider  acetaminophen (TYLENOL) 325 MG tablet Take 650 mg by mouth every 6 (six) hours as needed for mild pain, moderate pain or headache.     [provider]  albuterol (PROVENTIL HFA;VENTOLIN HFA) 108 (90 Base) MCG/ACT inhaler Inhale 1-2 puffs into the lungs every 6 (six) hours as needed for wheezing or shortness of breath. 10/21/17   Colbert Ewing, MD  aspirin EC 81 MG tablet Take 1 tablet (81 mg total) by mouth daily. 10/21/17   Colbert Ewing, MD  atorvastatin (LIPITOR) 40 MG tablet Take 1 tablet (40 mg total) by mouth daily at 6 PM. 10/21/17   Colbert Ewing, MD  bisoprolol (ZEBETA) 5 MG tablet Take 0.5 tablets (2.5 mg total) by mouth daily. 10/21/17   Colbert Ewing, MD  Emollient (LUBRIDERM DAILY MOISTURE) LOTN Apply 1 application topically 2 (two) times daily. Apply topically to dry exfoliative areas of legs    [provider]  furosemide (LASIX) 20 MG tablet Take 1 tablet (20 mg total) by mouth daily. 06/15/18   Masoudi, Elhamalsadat, MD  Hypromellose (NATURAL BALANCE TEARS OP) Apply 1 drop to eye 3 (three) times daily.    [provider]  isosorbide mononitrate (IMDUR) 30 MG 24 hr tablet Take 1 tablet (30 mg total) by mouth daily. 03/04/18   Aldine Contes, MD  losartan (COZAAR) 100 MG tablet Take 1 tablet (100 mg total) by mouth daily. 09/23/17 09/23/18  Jule Ser, DO    Family History Family History  Problem Relation Age of Onset  . Stroke Mother   . Hypertension Mother   . Stroke Father   . Hypertension Father   . Diabetes Sister     Social History Social History   Tobacco Use  . Smoking status: Former Smoker     Types: Cigarettes    Last attempt to quit: 07/02/1979    Years since quitting: 38.9  . Smokeless tobacco: Former Systems developer  . Tobacco comment: Started in teenage years - Quit 1980  Substance Use Topics  . Alcohol use: No    Alcohol/week: 0.0 standard drinks    Comment: Quit alcohol 1975-76  . Drug use: No     Allergies   Patient has no known allergies.   Review of Systems Review of Systems  Constitutional: Negative for fatigue and fever.  HENT: Negative for sore throat.   Eyes: Negative for visual disturbance.  Respiratory: Negative for cough and shortness of breath (no change baseline CHF).   Cardiovascular: Negative for chest pain.  Gastrointestinal: Negative for abdominal pain, nausea and vomiting.  Genitourinary:  Negative for difficulty urinating.  Musculoskeletal: Negative for back pain and neck pain.  Skin: Positive for wound. Negative for rash.  Neurological: Negative for dizziness, syncope, facial asymmetry, speech difficulty, weakness, light-headedness, numbness and headaches.     Physical Exam Updated Vital Signs BP (!) 130/56 (BP Location: Right Arm)   Pulse 66   Temp 97.8 F (36.6 C) (Oral)   Resp 20   Ht 5\' 2"  (1.575 m)   Wt 65.8 kg   SpO2 99%   BMI 26.52 kg/m   Physical Exam Vitals signs and nursing note reviewed.  Constitutional:      General: She is not in acute distress.    Appearance: She is well-developed. She is not diaphoretic.  HENT:     Head: Normocephalic and atraumatic.  Eyes:     Conjunctiva/sclera: Conjunctivae normal.  Neck:     Musculoskeletal: Normal range of motion.  Cardiovascular:     Rate and Rhythm: Normal rate and regular rhythm.     Heart sounds: Normal heart sounds. No murmur. No friction rub. No gallop.   Pulmonary:     Effort: Pulmonary effort is normal. No respiratory distress.     Breath sounds: Normal breath sounds. No wheezing or rales.  Abdominal:     General: There is no distension.     Palpations: Abdomen is soft.      Tenderness: There is no abdominal tenderness. There is no guarding.  Musculoskeletal:        General: No tenderness.     Cervical back: She exhibits no tenderness and no bony tenderness.     Thoracic back: She exhibits no tenderness and no bony tenderness.     Lumbar back: She exhibits no tenderness and no bony tenderness.  Skin:    General: Skin is warm and dry.     Findings: No erythema or rash.     Comments: Skin tear right elbow Intertriginous erythema and darkening of skin Top of scalp with 2cm ulcerated mass  Neurological:     Mental Status: She is alert and oriented to person, place, and time.     GCS: GCS eye subscore is 4. GCS verbal subscore is 5. GCS motor subscore is 6.     Cranial Nerves: Cranial nerves are intact. No cranial nerve deficit, dysarthria or facial asymmetry.     Sensory: Sensation is intact. No sensory deficit.     Motor: Motor function is intact. No weakness or pronator drift.      ED Treatments / Results  Labs (all labs ordered are listed, but only abnormal results are displayed) Labs Reviewed - No data to display  EKG None  Radiology No results found.  Procedures Procedures (including critical care time)  Medications Ordered in ED Medications  Tdap (BOOSTRIX) injection 0.5 mL (has no administration in time range)     Initial Impression / Assessment and Plan / ED Course  I have reviewed the triage vital signs and the nursing notes.  Pertinent labs & imaging results that were available during my care of the patient were reviewed by me and considered in my medical decision making (see chart for details).     84 year old female with history below presents with concern for mechanical fall.  Patient describes a mechanical fall, no syncope, and no recent illness.  There are no signs of acute pathology on history and exam.  She was noted to be hypertensive on scene with EMS, however on arrival here her blood pressures are 160/71, 130/56.  She she does not have signs of hypertensive emergency on history or exam. Suspect the hypertension she had at the scene was secondary to anxiety and pain.  She has a skin tear over her right elbow, however no bony tenderness, full range of motion, low suspicion for underlying fracture.  Denies any other tenderness on exam of the cervical thoracic, lumbar spine, has full range of motion of the hips, and no sign of other musculoskeletal injuries.  Noted chronic intertrigo, pt reports she has seen dermatology regarding this.  Given her difficulty standing up after she fell, patient living independently, recommend life alert, consulted social work to assist. Patient discharged in stable condition with understanding of reasons to return. Will also refer to podiatry for toenails.   Final Clinical Impressions(s) / ED Diagnoses   Final diagnoses:  Fall, initial encounter  Abrasion    ED Discharge Orders    None       Gareth Morgan, MD 06/21/18 [phone]

## 2018-06-21 NOTE — ED Triage Notes (Addendum)
Fall this am after tripping on cord. Skin tear at right forearm. Pt also has bp 200/100. No LOC. Denies any other complaint.

## 2018-06-21 NOTE — ED Notes (Signed)
CSW at bedsdie with pt and family

## 2018-06-21 NOTE — Care Management (Signed)
ED CM spoke with patient at bedside patient is active with [facility] and last visit was 03/2018 patient has follow up appointment on 07/09/2018 denies any known transportation . Patient is interested in [employer], patient given information discussed [facility] services for transitioning home, patient states she has not needed [facility] services and is a little hesitant she states she wants to continue her independence, explained the balance program and patient was agreeable, [facility] was provided from the list as preferred  [facility] agency.  ED CM will reach out to PCP concerning patient's care coordination plan. No further ED CM needs.

## 2018-06-21 NOTE — Progress Notes (Signed)
CSW received consult for life alert. CSW provided patient and patient's niece with a brochure on med alert for safety concerns returning home. CSW inquired into last PCP visit and noted patient was unsure. Patient was not orientated to time as she believed it was night and the year was 2021. CSW noted patient affirmed she would be all right home and noted [facility] staff concerns and nieces concerns returning home. CSW consulted RNCM. RNCM and CSW spoke with patient and niece together regarding following up with primary care provider to refer to home health and the med alert program. CSW notes patient reported her daughter-in-law Cassie Freer would likely be the best family contact to support her in addressing her healthcare needs and can be reached at [phone]. CSW signing off.  Lamonte Richer, LCSW, [place] [facility] [phone]

## 2018-06-21 NOTE — ED Notes (Signed)
DC instructions discussed with pt and niece. Understanding voiced and pt discharged stable via wc.

## 2018-06-27 DIAGNOSIS — S8002XD Contusion of left knee, subsequent encounter: Secondary | ICD-10-CM | POA: Diagnosis not present

## 2018-06-27 DIAGNOSIS — I5030 Unspecified diastolic (congestive) heart failure: Secondary | ICD-10-CM | POA: Diagnosis not present

## 2018-06-27 DIAGNOSIS — I110 Hypertensive heart disease with heart failure: Secondary | ICD-10-CM | POA: Diagnosis not present

## 2018-06-27 DIAGNOSIS — Z7982 Long term (current) use of aspirin: Secondary | ICD-10-CM | POA: Diagnosis not present

## 2018-06-27 DIAGNOSIS — W1809XD Striking against other object with subsequent fall, subsequent encounter: Secondary | ICD-10-CM | POA: Diagnosis not present

## 2018-06-29 ENCOUNTER — Telehealth: Payer: Self-pay | Admitting: *Deleted

## 2018-06-29 DIAGNOSIS — S8002XD Contusion of left knee, subsequent encounter: Secondary | ICD-10-CM | POA: Diagnosis not present

## 2018-06-29 DIAGNOSIS — I110 Hypertensive heart disease with heart failure: Secondary | ICD-10-CM | POA: Diagnosis not present

## 2018-06-29 DIAGNOSIS — W1809XD Striking against other object with subsequent fall, subsequent encounter: Secondary | ICD-10-CM | POA: Diagnosis not present

## 2018-06-29 DIAGNOSIS — I5030 Unspecified diastolic (congestive) heart failure: Secondary | ICD-10-CM | POA: Diagnosis not present

## 2018-06-29 DIAGNOSIS — Z7982 Long term (current) use of aspirin: Secondary | ICD-10-CM | POA: Diagnosis not present

## 2018-06-29 NOTE — Telephone Encounter (Signed)
Call from Amber, PT from [facility] - she saw pt today; eval done; hx of fall. Requesting verbal orders for "PT 2 times a week x 2 weeks then 1 times a week x 2 weeks". VO given - if not appropriate, let me know.

## 2018-06-29 NOTE — Telephone Encounter (Signed)
Agree with VO

## 2018-07-01 ENCOUNTER — Other Ambulatory Visit: Payer: Self-pay

## 2018-07-01 ENCOUNTER — Emergency Department (HOSPITAL_COMMUNITY)
Admission: EM | Admit: 2018-07-01 | Discharge: 2018-07-01 | Disposition: A | Discharge status: Home / Self Care | Payer: Medicare Other | Attending: Emergency Medicine | Admitting: Emergency Medicine

## 2018-07-01 ENCOUNTER — Encounter (HOSPITAL_COMMUNITY): Payer: Self-pay | Admitting: Pharmacy Technician

## 2018-07-01 ENCOUNTER — Emergency Department (HOSPITAL_COMMUNITY): Payer: Medicare Other

## 2018-07-01 DIAGNOSIS — N183 Chronic kidney disease, stage 3 (moderate): Secondary | ICD-10-CM | POA: Diagnosis not present

## 2018-07-01 DIAGNOSIS — W19XXXA Unspecified fall, initial encounter: Secondary | ICD-10-CM | POA: Diagnosis not present

## 2018-07-01 DIAGNOSIS — I130 Hypertensive heart and chronic kidney disease with heart failure and stage 1 through stage 4 chronic kidney disease, or unspecified chronic kidney disease: Secondary | ICD-10-CM | POA: Insufficient documentation

## 2018-07-01 DIAGNOSIS — I2510 Atherosclerotic heart disease of native coronary artery without angina pectoris: Secondary | ICD-10-CM | POA: Insufficient documentation

## 2018-07-01 DIAGNOSIS — R52 Pain, unspecified: Secondary | ICD-10-CM | POA: Diagnosis not present

## 2018-07-01 DIAGNOSIS — R0902 Hypoxemia: Secondary | ICD-10-CM | POA: Diagnosis not present

## 2018-07-01 DIAGNOSIS — S0990XA Unspecified injury of head, initial encounter: Secondary | ICD-10-CM | POA: Diagnosis not present

## 2018-07-01 DIAGNOSIS — R51 Headache: Secondary | ICD-10-CM | POA: Insufficient documentation

## 2018-07-01 DIAGNOSIS — I509 Heart failure, unspecified: Secondary | ICD-10-CM | POA: Diagnosis not present

## 2018-07-01 DIAGNOSIS — I10 Essential (primary) hypertension: Secondary | ICD-10-CM | POA: Diagnosis not present

## 2018-07-01 LAB — CBC WITH DIFFERENTIAL/PLATELET
ABS IMMATURE GRANULOCYTES: 0.02 10*3/uL (ref 0.00–0.07)
Basophils Absolute: 0 10*3/uL (ref 0.0–0.1)
Basophils Relative: 0 %
EOS PCT: 1 %
Eosinophils Absolute: 0 10*3/uL (ref 0.0–0.5)
HEMATOCRIT: 32.1 % — AB (ref 36.0–46.0)
HEMOGLOBIN: 10.5 g/dL — AB (ref 12.0–15.0)
Immature Granulocytes: 1 %
LYMPHS PCT: 36 %
Lymphs Abs: 1.2 10*3/uL (ref 0.7–4.0)
MCH: 31.8 pg (ref 26.0–34.0)
MCHC: 32.7 g/dL (ref 30.0–36.0)
MCV: 97.3 fL (ref 80.0–100.0)
MONO ABS: 0.4 10*3/uL (ref 0.1–1.0)
MONOS PCT: 11 %
NEUTROS ABS: 1.7 10*3/uL (ref 1.7–7.7)
Neutrophils Relative %: 51 %
Platelets: 124 10*3/uL — ABNORMAL LOW (ref 150–400)
RBC: 3.3 MIL/uL — ABNORMAL LOW (ref 3.87–5.11)
RDW: 13.3 % (ref 11.5–15.5)
WBC: 3.4 10*3/uL — ABNORMAL LOW (ref 4.0–10.5)
nRBC: 0 % (ref 0.0–0.2)

## 2018-07-01 LAB — I-STAT CHEM 8, ED
BUN: 20 mg/dL (ref 8–23)
CHLORIDE: 106 mmol/L (ref 98–111)
CREATININE: 1.3 mg/dL — AB (ref 0.44–1.00)
Calcium, Ion: 1.1 mmol/L — ABNORMAL LOW (ref 1.15–1.40)
GLUCOSE: 103 mg/dL — AB (ref 70–99)
HCT: 31 % — ABNORMAL LOW (ref 36.0–46.0)
Hemoglobin: 10.5 g/dL — ABNORMAL LOW (ref 12.0–15.0)
POTASSIUM: 2.9 mmol/L — AB (ref 3.5–5.1)
Sodium: 145 mmol/L (ref 135–145)
TCO2: 25 mmol/L (ref 22–32)

## 2018-07-01 MED ORDER — MAGNESIUM OXIDE 400 (241.3 MG) MG PO TABS
800.0000 mg | ORAL_TABLET | Freq: Once | ORAL | Status: AC
Start: 2018-07-01 — End: 2018-07-01
  Administered 2018-07-01: 800 mg via ORAL
  Filled 2018-07-01: qty 2

## 2018-07-01 MED ORDER — POTASSIUM CHLORIDE CRYS ER 20 MEQ PO TBCR
40.0000 meq | EXTENDED_RELEASE_TABLET | Freq: Once | ORAL | Status: AC
  Administered 2018-07-01: 40 meq via ORAL
  Filled 2018-07-01: qty 2

## 2018-07-01 NOTE — ED Triage Notes (Signed)
Pt arrives via EMS from home with reports of mechanical fall hitting her head on the wall before falling on her bottom. Pt denies LOC. Denies blood thinners. 174/100, HR 64, RR18, 98% RA, CBG 143. Pt here to "make sure my head is okay".

## 2018-07-01 NOTE — ED Provider Notes (Signed)
[facility] EMERGENCY DEPARTMENT Provider Note   CSN: [id number] Arrival date & time: 07/01/18  1836     History   Chief Complaint Chief Complaint  Patient presents with  . Fall    HPI Jane [name] is a 85 y.o. female.  85 yo F with a chief complaint of a fall.  Patient is not sure why she fell down.  She wants to discuss the fall that she had earlier this month.  Next she has been eating and drinking normally.  She bumped her head and she wanted to make sure that was okay.  Denies trauma anywhere else.  The history is provided by the patient.  Fall  This is a new problem. The current episode started 1 to 2 hours ago. The problem occurs constantly. The problem has not changed since onset.Associated symptoms include headaches. Pertinent negatives include no chest pain, no abdominal pain and no shortness of breath. Nothing aggravates the symptoms. Nothing relieves the symptoms. She has tried nothing for the symptoms. The treatment provided no relief.    Past Medical History:  Diagnosis Date  . Anemia   . Aortic insufficiency    mild  . Arthritis    "arms" (08/09/2015)  . CAD (coronary artery disease)    cath 08/09/2014 95% stenosis in prox to mid RCA s/p DES, 80-90% prox OM2, 50% distal LAD  . CHF (congestive heart failure) ([place])   . CKD (chronic kidney disease) stage 3, GFR 30-59 ml/min (HCC)   . Colon polyp    2009 colonoscopy, not retrieved for pathology  . Dyspnea   . Ectropion of left lower eyelid   . GERD (gastroesophageal reflux disease) 2009   EGD with benign gastric polyp too  . Hyperlipidemia   . Hypertension   . Pneumonia    history of  . Stroke ([place]) 1975  . Vitamin D deficiency     Patient Active Problem List   Diagnosis Date Noted  . Toenail deformity 03/19/2018  . Skin lesion of scalp 03/19/2018  . CAD (coronary artery disease) 08/25/2017  . Unsteady gait 08/11/2016  . Abdominal aortic aneurysm ([place]) 05/21/2016  . Trochanteric  bursitis of left hip   . CKD (chronic kidney disease) 03/31/2015  . Prediabetes 10/16/2014  . Status post insertion of drug-eluting stent into right coronary artery for coronary artery disease 08/07/2014  . Dyspnea 05/31/2014  . Physical deconditioning 05/31/2014  . Heart failure with preserved ejection fraction ([place]) 12/27/2012  . Ectropion of left lower eyelid 12/17/2012  . Onychomycosis of toenail 09/18/2012  . PAD (peripheral artery disease) ([place]) 09/18/2012  . Preventative health care 09/18/2012  . Vitamin D deficiency 04/25/2008  . History of cerebrovascular accident 10/29/2007  . Hyperlipidemia 09/24/2006  . Normocytic anemia 09/24/2006  . Hypertension 09/24/2006  . OSTEOPENIA 09/24/2006  . Vitamin B12 deficiency 08/29/2006    Past Surgical History:  Procedure Laterality Date  . ABDOMINAL HYSTERECTOMY    . APPENDECTOMY    . ARTERY BIOPSY Left 12/16/2012   Procedure: BIOPSY TEMPORAL ARTERY;  Surgeon: Mal Misty, MD;  Location: [facility];  Service: Vascular;  Laterality: Left;  . CARDIAC CATHETERIZATION    . LEFT HEART CATHETERIZATION WITH CORONARY ANGIOGRAM N/A 08/09/2014   Procedure: LEFT HEART CATHETERIZATION WITH CORONARY ANGIOGRAM;  Surgeon: Peter M Martinique, MD;  Location: [facility] CATH LAB;  Service: Cardiovascular;  Laterality: N/A;  . PERCUTANEOUS CORONARY STENT INTERVENTION (PCI-S)  08/09/2014   Procedure: PERCUTANEOUS CORONARY STENT INTERVENTION (PCI-S);  Surgeon: Collier Salina  M Martinique, MD;  Location: [facility] CATH LAB;  Service: Cardiovascular;;  . TONSILLECTOMY       OB History   No obstetric history on file.      Home Medications    Prior to Admission medications   Medication Sig Start Date End Date Taking? Authorizing Provider  acetaminophen (TYLENOL) 325 MG tablet Take 650 mg by mouth every 6 (six) hours as needed for mild pain, moderate pain or headache.     [provider]  albuterol (PROVENTIL HFA;VENTOLIN HFA) 108 (90 Base) MCG/ACT inhaler Inhale 1-2 puffs into  the lungs every 6 (six) hours as needed for wheezing or shortness of breath. 10/21/17   Colbert Ewing, MD  aspirin EC 81 MG tablet Take 1 tablet (81 mg total) by mouth daily. 10/21/17   Colbert Ewing, MD  atorvastatin (LIPITOR) 40 MG tablet Take 1 tablet (40 mg total) by mouth daily at 6 PM. 10/21/17   Colbert Ewing, MD  bisoprolol (ZEBETA) 5 MG tablet Take 0.5 tablets (2.5 mg total) by mouth daily. 10/21/17   Colbert Ewing, MD  Emollient (LUBRIDERM DAILY MOISTURE) LOTN Apply 1 application topically 2 (two) times daily. Apply topically to dry exfoliative areas of legs    [provider]  furosemide (LASIX) 20 MG tablet Take 1 tablet (20 mg total) by mouth daily. 06/15/18   Masoudi, Elhamalsadat, MD  Hypromellose (NATURAL BALANCE TEARS OP) Apply 1 drop to eye 3 (three) times daily.    [provider]  isosorbide mononitrate (IMDUR) 30 MG 24 hr tablet Take 1 tablet (30 mg total) by mouth daily. 03/04/18   Aldine Contes, MD  losartan (COZAAR) 100 MG tablet Take 1 tablet (100 mg total) by mouth daily. 09/23/17 09/23/18  Jule Ser, DO    Family History Family History  Problem Relation Age of Onset  . Stroke Mother   . Hypertension Mother   . Stroke Father   . Hypertension Father   . Diabetes Sister     Social History Social History   Tobacco Use  . Smoking status: Former Smoker    Types: Cigarettes    Last attempt to quit: 07/02/1979    Years since quitting: 39.0  . Smokeless tobacco: Former Systems developer  . Tobacco comment: Started in teenage years - Quit 1980  Substance Use Topics  . Alcohol use: No    Alcohol/week: 0.0 standard drinks    Comment: Quit alcohol 1975-76  . Drug use: No     Allergies   Patient has no known allergies.   Review of Systems Review of Systems  Constitutional: Negative for chills and fever.  HENT: Negative for congestion and rhinorrhea.   Eyes: Negative for redness and visual disturbance.  Respiratory: Negative for shortness of  breath and wheezing.   Cardiovascular: Negative for chest pain and palpitations.  Gastrointestinal: Negative for abdominal pain, nausea and vomiting.  Genitourinary: Negative for dysuria and urgency.  Musculoskeletal: Negative for arthralgias and myalgias.  Skin: Negative for pallor and wound.  Neurological: Positive for headaches. Negative for dizziness.     Physical Exam Updated Vital Signs BP (!) 165/92   Pulse 70   Temp 98.2 F (36.8 C) (Oral)   Resp 17   SpO2 98%   Physical Exam Vitals signs and nursing note reviewed.  Constitutional:      General: She is not in acute distress.    Appearance: She is well-developed. She is not diaphoretic.  HENT:     Head: Normocephalic and atraumatic.  Eyes:  Pupils: Pupils are equal, round, and reactive to light.  Neck:     Musculoskeletal: Normal range of motion and neck supple.  Cardiovascular:     Rate and Rhythm: Normal rate and regular rhythm.     Heart sounds: No murmur. No friction rub. No gallop.   Pulmonary:     Effort: Pulmonary effort is normal.     Breath sounds: No wheezing or rales.  Abdominal:     General: There is no distension.     Palpations: Abdomen is soft.     Tenderness: There is no abdominal tenderness.  Musculoskeletal:        General: No tenderness.  Skin:    General: Skin is warm and dry.  Neurological:     Mental Status: She is alert and oriented to person, place, and time.  Psychiatric:        Behavior: Behavior normal.      ED Treatments / Results  Labs (all labs ordered are listed, but only abnormal results are displayed) Labs Reviewed  CBC WITH DIFFERENTIAL/PLATELET - Abnormal; Notable for the following components:      Result Value   WBC 3.4 (*)    RBC 3.30 (*)    Hemoglobin 10.5 (*)    HCT 32.1 (*)    Platelets 124 (*)    All other components within normal limits  I-STAT CHEM 8, ED - Abnormal; Notable for the following components:   Potassium 2.9 (*)    Creatinine, Ser 1.30  (*)    Glucose, Bld 103 (*)    Calcium, Ion 1.10 (*)    Hemoglobin 10.5 (*)    HCT 31.0 (*)    All other components within normal limits  CBG MONITORING, ED    EKG EKG Interpretation  Date/Time:  Wednesday July 01 2018 19:36:57 EST Ventricular Rate:  73 PR Interval:    QRS Duration: 109 QT Interval:  447 QTC Calculation: 493 R Axis:   9 Text Interpretation:  Sinus rhythm Probable left atrial enlargement Borderline repolarization abnormality Borderline prolonged QT interval t wave flatening Otherwise no significant change Confirmed by Deno Etienne [phone]) on 07/01/2018 8:27:54 PM   Radiology Ct Head Wo Contrast  Result Date: 07/01/2018 CLINICAL DATA:  Fall EXAM: CT HEAD WITHOUT CONTRAST TECHNIQUE: Contiguous axial images were obtained from the base of the skull through the vertex without intravenous contrast. COMPARISON:  CT 01/25/2018 FINDINGS: Brain: No acute territorial infarction, hemorrhage or intracranial mass. Stable bifrontal extra-axial CSF density likely due to atrophy. Moderate small vessel ischemic changes of the white matter. Stable ventricle size. Vascular: No hyperdense vessels. Vertebral and carotid vascular calcification Skull: Normal. Negative for fracture or focal lesion. Sinuses/Orbits: No acute finding. Other: None IMPRESSION: 1. No CT evidence for acute intracranial abnormality. 2. Atrophy and small vessel ischemic changes of the white matter Electronically Signed   By: Donavan Foil M.D.   On: 07/01/2018 21:01    Procedures Procedures (including critical care time)  Medications Ordered in ED Medications  potassium chloride SA (K-DUR,KLOR-CON) CR tablet 40 mEq (40 mEq Oral Given 07/01/18 2134)  magnesium oxide (MAG-OX) tablet 800 mg (800 mg Oral Given 07/01/18 2134)     Initial Impression / Assessment and Plan / ED Course  I have reviewed the triage vital signs and the nursing notes.  Pertinent labs & imaging results that were available during my care of the  patient were reviewed by me and considered in my medical decision making (see chart for details).  85 yo F with a chief complaint of a fall.  This is the second 1 this month.  She thinks that she bumped her head against something and that made her fall down.  She is somewhat confused about the exact events.  Therefore obtained laboratory evaluation.  She has a she has a very mild hypokalemia.  We will give some potassium and magnesium here.  CT of the head. Ct negative. D/c home.   10:31 PM:  I have discussed the diagnosis/risks/treatment options with the patient and believe the pt to be eligible for discharge home to follow-up with PCP. We also discussed returning to the ED immediately if new or worsening sx occur. We discussed the sx which are most concerning (e.g., sudden worsening pain, fever, inability to tolerate by mouth) that necessitate immediate return. Medications administered to the patient during their visit and any new prescriptions provided to the patient are listed below.  Medications given during this visit Medications  potassium chloride SA (K-DUR,KLOR-CON) CR tablet 40 mEq (40 mEq Oral Given 07/01/18 2134)  magnesium oxide (MAG-OX) tablet 800 mg (800 mg Oral Given 07/01/18 2134)      The patient appears reasonably screen and/or stabilized for discharge and I doubt any other medical condition or other [facility] requiring further screening, evaluation, or treatment in the ED at this time prior to discharge.    Final Clinical Impressions(s) / ED Diagnoses   Final diagnoses:  Fall, initial encounter    ED Discharge Orders    None       Deno Etienne, DO 07/01/18 2232

## 2018-07-01 NOTE — Discharge Instructions (Signed)
Falls can be a dangerous thing when you have more birthdays and other people.  You need to discuss this with your family doctor they may want to do a physical therapy evaluation of your further work-up to try and figure out why of had multiple falls in the past month.  Return to the ED for worsening symptoms.

## 2018-07-02 DIAGNOSIS — S8002XD Contusion of left knee, subsequent encounter: Secondary | ICD-10-CM | POA: Diagnosis not present

## 2018-07-02 DIAGNOSIS — I5030 Unspecified diastolic (congestive) heart failure: Secondary | ICD-10-CM | POA: Diagnosis not present

## 2018-07-02 DIAGNOSIS — W1809XD Striking against other object with subsequent fall, subsequent encounter: Secondary | ICD-10-CM | POA: Diagnosis not present

## 2018-07-02 DIAGNOSIS — I110 Hypertensive heart disease with heart failure: Secondary | ICD-10-CM | POA: Diagnosis not present

## 2018-07-02 DIAGNOSIS — Z7982 Long term (current) use of aspirin: Secondary | ICD-10-CM | POA: Diagnosis not present

## 2018-07-03 ENCOUNTER — Telehealth: Payer: Self-pay | Admitting: *Deleted

## 2018-07-03 NOTE — Telephone Encounter (Signed)
Call from [place], [facility] - requesting verbal orders for Nursing and OT -  "Nursing one time a week x 1 week; 2 times a week x 2 weeks ; 1 time a week x 3 weeks then 2 PRN visits. OT 1 time a week x 1 week; 2 times a week x 2 weeks ; then 1 time a week x 1 week". VO given - if not appropriate let me know. Thanks

## 2018-07-04 NOTE — Telephone Encounter (Signed)
Agree  Thank you

## 2018-07-06 DIAGNOSIS — S8002XD Contusion of left knee, subsequent encounter: Secondary | ICD-10-CM | POA: Diagnosis not present

## 2018-07-06 DIAGNOSIS — Z7982 Long term (current) use of aspirin: Secondary | ICD-10-CM | POA: Diagnosis not present

## 2018-07-06 DIAGNOSIS — W1809XD Striking against other object with subsequent fall, subsequent encounter: Secondary | ICD-10-CM | POA: Diagnosis not present

## 2018-07-06 DIAGNOSIS — I5030 Unspecified diastolic (congestive) heart failure: Secondary | ICD-10-CM | POA: Diagnosis not present

## 2018-07-06 DIAGNOSIS — I110 Hypertensive heart disease with heart failure: Secondary | ICD-10-CM | POA: Diagnosis not present

## 2018-07-08 DIAGNOSIS — Z7982 Long term (current) use of aspirin: Secondary | ICD-10-CM | POA: Diagnosis not present

## 2018-07-08 DIAGNOSIS — W1809XD Striking against other object with subsequent fall, subsequent encounter: Secondary | ICD-10-CM | POA: Diagnosis not present

## 2018-07-08 DIAGNOSIS — I110 Hypertensive heart disease with heart failure: Secondary | ICD-10-CM | POA: Diagnosis not present

## 2018-07-08 DIAGNOSIS — S8002XD Contusion of left knee, subsequent encounter: Secondary | ICD-10-CM | POA: Diagnosis not present

## 2018-07-08 DIAGNOSIS — I5030 Unspecified diastolic (congestive) heart failure: Secondary | ICD-10-CM | POA: Diagnosis not present

## 2018-07-09 ENCOUNTER — Encounter: Payer: Self-pay | Admitting: Internal Medicine

## 2018-07-10 DIAGNOSIS — W1809XD Striking against other object with subsequent fall, subsequent encounter: Secondary | ICD-10-CM | POA: Diagnosis not present

## 2018-07-10 DIAGNOSIS — S8002XD Contusion of left knee, subsequent encounter: Secondary | ICD-10-CM | POA: Diagnosis not present

## 2018-07-10 DIAGNOSIS — I5030 Unspecified diastolic (congestive) heart failure: Secondary | ICD-10-CM | POA: Diagnosis not present

## 2018-07-10 DIAGNOSIS — I110 Hypertensive heart disease with heart failure: Secondary | ICD-10-CM | POA: Diagnosis not present

## 2018-07-10 DIAGNOSIS — Z7982 Long term (current) use of aspirin: Secondary | ICD-10-CM | POA: Diagnosis not present

## 2018-07-13 DIAGNOSIS — I110 Hypertensive heart disease with heart failure: Secondary | ICD-10-CM | POA: Diagnosis not present

## 2018-07-13 DIAGNOSIS — S8002XD Contusion of left knee, subsequent encounter: Secondary | ICD-10-CM | POA: Diagnosis not present

## 2018-07-13 DIAGNOSIS — Z7982 Long term (current) use of aspirin: Secondary | ICD-10-CM | POA: Diagnosis not present

## 2018-07-13 DIAGNOSIS — I5030 Unspecified diastolic (congestive) heart failure: Secondary | ICD-10-CM | POA: Diagnosis not present

## 2018-07-13 DIAGNOSIS — W1809XD Striking against other object with subsequent fall, subsequent encounter: Secondary | ICD-10-CM | POA: Diagnosis not present

## 2018-07-14 DIAGNOSIS — S8002XD Contusion of left knee, subsequent encounter: Secondary | ICD-10-CM | POA: Diagnosis not present

## 2018-07-14 DIAGNOSIS — Z7982 Long term (current) use of aspirin: Secondary | ICD-10-CM | POA: Diagnosis not present

## 2018-07-14 DIAGNOSIS — I5030 Unspecified diastolic (congestive) heart failure: Secondary | ICD-10-CM | POA: Diagnosis not present

## 2018-07-14 DIAGNOSIS — W1809XD Striking against other object with subsequent fall, subsequent encounter: Secondary | ICD-10-CM | POA: Diagnosis not present

## 2018-07-14 DIAGNOSIS — I110 Hypertensive heart disease with heart failure: Secondary | ICD-10-CM | POA: Diagnosis not present

## 2018-07-17 DIAGNOSIS — S8002XD Contusion of left knee, subsequent encounter: Secondary | ICD-10-CM | POA: Diagnosis not present

## 2018-07-17 DIAGNOSIS — I110 Hypertensive heart disease with heart failure: Secondary | ICD-10-CM | POA: Diagnosis not present

## 2018-07-17 DIAGNOSIS — W1809XD Striking against other object with subsequent fall, subsequent encounter: Secondary | ICD-10-CM | POA: Diagnosis not present

## 2018-07-17 DIAGNOSIS — Z7982 Long term (current) use of aspirin: Secondary | ICD-10-CM | POA: Diagnosis not present

## 2018-07-17 DIAGNOSIS — I5030 Unspecified diastolic (congestive) heart failure: Secondary | ICD-10-CM | POA: Diagnosis not present

## 2018-07-20 DIAGNOSIS — W1809XD Striking against other object with subsequent fall, subsequent encounter: Secondary | ICD-10-CM | POA: Diagnosis not present

## 2018-07-20 DIAGNOSIS — S8002XD Contusion of left knee, subsequent encounter: Secondary | ICD-10-CM | POA: Diagnosis not present

## 2018-07-20 DIAGNOSIS — Z7982 Long term (current) use of aspirin: Secondary | ICD-10-CM | POA: Diagnosis not present

## 2018-07-20 DIAGNOSIS — I5030 Unspecified diastolic (congestive) heart failure: Secondary | ICD-10-CM | POA: Diagnosis not present

## 2018-07-20 DIAGNOSIS — I110 Hypertensive heart disease with heart failure: Secondary | ICD-10-CM | POA: Diagnosis not present

## 2018-07-22 DIAGNOSIS — Z7982 Long term (current) use of aspirin: Secondary | ICD-10-CM | POA: Diagnosis not present

## 2018-07-22 DIAGNOSIS — I110 Hypertensive heart disease with heart failure: Secondary | ICD-10-CM | POA: Diagnosis not present

## 2018-07-22 DIAGNOSIS — I5030 Unspecified diastolic (congestive) heart failure: Secondary | ICD-10-CM | POA: Diagnosis not present

## 2018-07-22 DIAGNOSIS — S8002XD Contusion of left knee, subsequent encounter: Secondary | ICD-10-CM | POA: Diagnosis not present

## 2018-07-22 DIAGNOSIS — W1809XD Striking against other object with subsequent fall, subsequent encounter: Secondary | ICD-10-CM | POA: Diagnosis not present

## 2018-07-25 ENCOUNTER — Other Ambulatory Visit: Payer: Self-pay

## 2018-07-25 ENCOUNTER — Inpatient Hospital Stay (HOSPITAL_COMMUNITY)
Admission: EM | Admit: 2018-07-25 | Discharge: 2018-07-29 | DRG: 603 | Disposition: A | Discharge status: Skilled Nursing Facility | Payer: Medicare Other | Attending: Internal Medicine | Admitting: Internal Medicine

## 2018-07-25 ENCOUNTER — Emergency Department (HOSPITAL_COMMUNITY): Payer: Medicare Other

## 2018-07-25 ENCOUNTER — Encounter (HOSPITAL_COMMUNITY): Payer: Self-pay | Admitting: Emergency Medicine

## 2018-07-25 DIAGNOSIS — I714 Abdominal aortic aneurysm, without rupture: Secondary | ICD-10-CM | POA: Diagnosis present

## 2018-07-25 DIAGNOSIS — R0602 Shortness of breath: Secondary | ICD-10-CM | POA: Diagnosis not present

## 2018-07-25 DIAGNOSIS — Z8673 Personal history of transient ischemic attack (TIA), and cerebral infarction without residual deficits: Secondary | ICD-10-CM

## 2018-07-25 DIAGNOSIS — Z90710 Acquired absence of both cervix and uterus: Secondary | ICD-10-CM

## 2018-07-25 DIAGNOSIS — L03116 Cellulitis of left lower limb: Principal | ICD-10-CM

## 2018-07-25 DIAGNOSIS — I130 Hypertensive heart and chronic kidney disease with heart failure and stage 1 through stage 4 chronic kidney disease, or unspecified chronic kidney disease: Secondary | ICD-10-CM

## 2018-07-25 DIAGNOSIS — R05 Cough: Secondary | ICD-10-CM | POA: Diagnosis not present

## 2018-07-25 DIAGNOSIS — D631 Anemia in chronic kidney disease: Secondary | ICD-10-CM | POA: Diagnosis present

## 2018-07-25 DIAGNOSIS — Z833 Family history of diabetes mellitus: Secondary | ICD-10-CM

## 2018-07-25 DIAGNOSIS — Z8249 Family history of ischemic heart disease and other diseases of the circulatory system: Secondary | ICD-10-CM

## 2018-07-25 DIAGNOSIS — K219 Gastro-esophageal reflux disease without esophagitis: Secondary | ICD-10-CM

## 2018-07-25 DIAGNOSIS — I2510 Atherosclerotic heart disease of native coronary artery without angina pectoris: Secondary | ICD-10-CM | POA: Diagnosis not present

## 2018-07-25 DIAGNOSIS — Z955 Presence of coronary angioplasty implant and graft: Secondary | ICD-10-CM

## 2018-07-25 DIAGNOSIS — I351 Nonrheumatic aortic (valve) insufficiency: Secondary | ICD-10-CM | POA: Diagnosis not present

## 2018-07-25 DIAGNOSIS — T502X5A Adverse effect of carbonic-anhydrase inhibitors, benzothiadiazides and other diuretics, initial encounter: Secondary | ICD-10-CM | POA: Diagnosis not present

## 2018-07-25 DIAGNOSIS — I872 Venous insufficiency (chronic) (peripheral): Secondary | ICD-10-CM

## 2018-07-25 DIAGNOSIS — L0390 Cellulitis, unspecified: Secondary | ICD-10-CM | POA: Diagnosis present

## 2018-07-25 DIAGNOSIS — E876 Hypokalemia: Secondary | ICD-10-CM | POA: Diagnosis not present

## 2018-07-25 DIAGNOSIS — Z79899 Other long term (current) drug therapy: Secondary | ICD-10-CM

## 2018-07-25 DIAGNOSIS — I5030 Unspecified diastolic (congestive) heart failure: Secondary | ICD-10-CM

## 2018-07-25 DIAGNOSIS — I739 Peripheral vascular disease, unspecified: Secondary | ICD-10-CM | POA: Diagnosis not present

## 2018-07-25 DIAGNOSIS — R21 Rash and other nonspecific skin eruption: Secondary | ICD-10-CM | POA: Diagnosis present

## 2018-07-25 DIAGNOSIS — Z7982 Long term (current) use of aspirin: Secondary | ICD-10-CM

## 2018-07-25 DIAGNOSIS — I5033 Acute on chronic diastolic (congestive) heart failure: Secondary | ICD-10-CM | POA: Insufficient documentation

## 2018-07-25 DIAGNOSIS — M7989 Other specified soft tissue disorders: Secondary | ICD-10-CM | POA: Diagnosis not present

## 2018-07-25 DIAGNOSIS — I10 Essential (primary) hypertension: Secondary | ICD-10-CM | POA: Diagnosis not present

## 2018-07-25 DIAGNOSIS — Z823 Family history of stroke: Secondary | ICD-10-CM

## 2018-07-25 DIAGNOSIS — Z87891 Personal history of nicotine dependence: Secondary | ICD-10-CM

## 2018-07-25 DIAGNOSIS — F419 Anxiety disorder, unspecified: Secondary | ICD-10-CM | POA: Diagnosis present

## 2018-07-25 DIAGNOSIS — I5032 Chronic diastolic (congestive) heart failure: Secondary | ICD-10-CM | POA: Diagnosis present

## 2018-07-25 DIAGNOSIS — N183 Chronic kidney disease, stage 3 (moderate): Secondary | ICD-10-CM

## 2018-07-25 DIAGNOSIS — H9190 Unspecified hearing loss, unspecified ear: Secondary | ICD-10-CM | POA: Diagnosis present

## 2018-07-25 DIAGNOSIS — E785 Hyperlipidemia, unspecified: Secondary | ICD-10-CM

## 2018-07-25 DIAGNOSIS — H547 Unspecified visual loss: Secondary | ICD-10-CM

## 2018-07-25 DIAGNOSIS — L03115 Cellulitis of right lower limb: Secondary | ICD-10-CM | POA: Diagnosis not present

## 2018-07-25 LAB — BASIC METABOLIC PANEL
Anion gap: 11 (ref 5–15)
Anion gap: 10 (ref 5–15)
BUN: 16 mg/dL (ref 8–23)
BUN: 15 mg/dL (ref 8–23)
CO2: 22 mmol/L (ref 22–32)
CO2: 20 mmol/L — ABNORMAL LOW (ref 22–32)
Calcium: 8.1 mg/dL — ABNORMAL LOW (ref 8.9–10.3)
Calcium: 8 mg/dL — ABNORMAL LOW (ref 8.9–10.3)
Chloride: 109 mmol/L (ref 98–111)
Chloride: 109 mmol/L (ref 98–111)
Creatinine, Ser: 0.99 mg/dL (ref 0.44–1.00)
Creatinine, Ser: 0.97 mg/dL (ref 0.44–1.00)
GFR calc Af Amer: 59 mL/min — ABNORMAL LOW (ref >60)
GFR calc non Af Amer: 51 mL/min — ABNORMAL LOW (ref >60)
GFR calc non Af Amer: 53 mL/min — ABNORMAL LOW (ref >60)
Glucose, Bld: 83 mg/dL (ref 70–99)
Glucose, Bld: 116 mg/dL — ABNORMAL HIGH (ref 70–99)
Potassium: 2.6 mmol/L — CL (ref 3.5–5.1)
Potassium: 4.1 mmol/L (ref 3.5–5.1)
Sodium: 142 mmol/L (ref 135–145)
Sodium: 139 mmol/L (ref 135–145)

## 2018-07-25 LAB — CBC WITH DIFFERENTIAL/PLATELET
Abs Immature Granulocytes: 0.01 10*3/uL (ref 0.00–0.07)
BASOS ABS: 0 10*3/uL (ref 0.0–0.1)
Basophils Relative: 0 %
Eosinophils Absolute: 0 10*3/uL (ref 0.0–0.5)
Eosinophils Relative: 0 %
HEMATOCRIT: 29.7 % — AB (ref 36.0–46.0)
Hemoglobin: 9.7 g/dL — ABNORMAL LOW (ref 12.0–15.0)
Immature Granulocytes: 0 %
Lymphocytes Relative: 39 %
Lymphs Abs: 1.2 10*3/uL (ref 0.7–4.0)
MCH: 31.6 pg (ref 26.0–34.0)
MCHC: 32.7 g/dL (ref 30.0–36.0)
MCV: 96.7 fL (ref 80.0–100.0)
Monocytes Absolute: 0.3 10*3/uL (ref 0.1–1.0)
Monocytes Relative: 10 %
Neutro Abs: 1.6 10*3/uL — ABNORMAL LOW (ref 1.7–7.7)
Neutrophils Relative %: 51 %
Platelets: 124 10*3/uL — ABNORMAL LOW (ref 150–400)
RBC: 3.07 MIL/uL — ABNORMAL LOW (ref 3.87–5.11)
RDW: 13 % (ref 11.5–15.5)
WBC: 3.1 10*3/uL — ABNORMAL LOW (ref 4.0–10.5)
nRBC: 0 % (ref 0.0–0.2)

## 2018-07-25 LAB — LACTIC ACID, PLASMA: Lactic Acid, Venous: 1.3 mmol/L (ref 0.5–1.9)

## 2018-07-25 LAB — BRAIN NATRIURETIC PEPTIDE: B Natriuretic Peptide: 382.8 pg/mL — ABNORMAL HIGH (ref 0.0–100.0)

## 2018-07-25 LAB — MAGNESIUM
MAGNESIUM: 1.3 mg/dL — AB (ref 1.7–2.4)
MAGNESIUM: 2.9 mg/dL — AB (ref 1.7–2.4)
Magnesium: 1.3 mg/dL — ABNORMAL LOW (ref 1.7–2.4)

## 2018-07-25 LAB — TROPONIN I: Troponin I: 0.03 ng/mL (ref <0.03)

## 2018-07-25 MED ORDER — POTASSIUM CHLORIDE 10 MEQ/100ML IV SOLN
INTRAVENOUS | Status: AC
  Administered 2018-07-25: 16:00:00
  Filled 2018-07-25: qty 100

## 2018-07-25 MED ORDER — POTASSIUM CHLORIDE 10 MEQ/100ML IV SOLN
10.0000 meq | INTRAVENOUS | Status: DC
  Administered 2018-07-25: 10 meq via INTRAVENOUS
  Filled 2018-07-25 (×2): qty 100

## 2018-07-25 MED ORDER — ENOXAPARIN SODIUM 40 MG/0.4ML ~~LOC~~ SOLN
40.0000 mg | SUBCUTANEOUS | Status: DC
  Administered 2018-07-25 – 2018-07-27 (×3): 40 mg via SUBCUTANEOUS
  Filled 2018-07-25 (×3): qty 0.4

## 2018-07-25 MED ORDER — CEPHALEXIN 250 MG PO CAPS: 500.0000 mg | ORAL_CAPSULE | Freq: Four times a day (QID) | ORAL | Status: DC

## 2018-07-25 MED ORDER — ASPIRIN EC 81 MG PO TBEC
81.0000 mg | DELAYED_RELEASE_TABLET | Freq: Every day | ORAL | Status: DC
  Administered 2018-07-25 – 2018-07-29 (×5): 81 mg via ORAL
  Filled 2018-07-25 (×4): qty 1

## 2018-07-25 MED ORDER — ACETAMINOPHEN 650 MG RE SUPP: 650.0000 mg | Freq: Four times a day (QID) | RECTAL | Status: DC | PRN

## 2018-07-25 MED ORDER — ATORVASTATIN CALCIUM 40 MG PO TABS
40.0000 mg | ORAL_TABLET | Freq: Every day | ORAL | Status: DC
  Administered 2018-07-25 – 2018-07-28 (×4): 40 mg via ORAL
  Filled 2018-07-25 (×5): qty 1

## 2018-07-25 MED ORDER — POTASSIUM CHLORIDE 10 MEQ/100ML IV SOLN
10.0000 meq | INTRAVENOUS | Status: AC
  Administered 2018-07-25 (×5): 10 meq via INTRAVENOUS
  Filled 2018-07-25 (×2): qty 100

## 2018-07-25 MED ORDER — ACETAMINOPHEN 325 MG PO TABS
650.0000 mg | ORAL_TABLET | Freq: Four times a day (QID) | ORAL | Status: DC | PRN
  Administered 2018-07-25 – 2018-07-27 (×5): 650 mg via ORAL
  Filled 2018-07-25 (×5): qty 2

## 2018-07-25 MED ORDER — CEPHALEXIN 250 MG PO CAPS
500.0000 mg | ORAL_CAPSULE | Freq: Two times a day (BID) | ORAL | Status: DC
  Administered 2018-07-25 – 2018-07-29 (×9): 500 mg via ORAL
  Filled 2018-07-25 (×9): qty 2

## 2018-07-25 MED ORDER — FUROSEMIDE 10 MG/ML IJ SOLN
20.0000 mg | Freq: Once | INTRAMUSCULAR | Status: DC
  Filled 2018-07-25: qty 2

## 2018-07-25 MED ORDER — BISOPROLOL FUMARATE 5 MG PO TABS
2.5000 mg | ORAL_TABLET | Freq: Every day | ORAL | Status: DC
  Administered 2018-07-25 – 2018-07-29 (×5): 2.5 mg via ORAL
  Filled 2018-07-25 (×5): qty 1

## 2018-07-25 MED ORDER — MAGNESIUM SULFATE 4 GM/100ML IV SOLN
4.0000 g | Freq: Once | INTRAVENOUS | Status: DC
  Filled 2018-07-25: qty 100

## 2018-07-25 MED ORDER — ISOSORBIDE MONONITRATE ER 30 MG PO TB24
30.0000 mg | ORAL_TABLET | Freq: Every day | ORAL | Status: DC
  Administered 2018-07-25 – 2018-07-29 (×5): 30 mg via ORAL
  Filled 2018-07-25 (×5): qty 1

## 2018-07-25 MED ORDER — LOSARTAN POTASSIUM 50 MG PO TABS
100.0000 mg | ORAL_TABLET | Freq: Every day | ORAL | Status: DC
  Administered 2018-07-25 – 2018-07-29 (×5): 100 mg via ORAL
  Filled 2018-07-25 (×5): qty 2

## 2018-07-25 MED ORDER — MAGNESIUM SULFATE 4 GM/100ML IV SOLN
4.0000 g | Freq: Once | INTRAVENOUS | Status: AC
  Administered 2018-07-25: 4 g via INTRAVENOUS
  Filled 2018-07-25: qty 100

## 2018-07-25 MED ORDER — POTASSIUM CHLORIDE CRYS ER 20 MEQ PO TBCR
40.0000 meq | EXTENDED_RELEASE_TABLET | Freq: Once | ORAL | Status: AC
  Administered 2018-07-25: 40 meq via ORAL
  Filled 2018-07-25: qty 2

## 2018-07-25 MED ORDER — VANCOMYCIN HCL IN DEXTROSE 1-5 GM/200ML-% IV SOLN
1000.0000 mg | Freq: Once | INTRAVENOUS | Status: AC
  Administered 2018-07-25: 1000 mg via INTRAVENOUS
  Filled 2018-07-25: qty 200

## 2018-07-25 NOTE — ED Provider Notes (Signed)
[facility] EMERGENCY DEPARTMENT Provider Note   CSN: [id number] Arrival date & time: 07/25/18  0253     History   Chief Complaint Chief Complaint  Patient presents with  . Shortness of Breath    HPI Jane [name] is a 85 y.o. female.  Patient with history of CHF, CKD presenting from home with shortness of breath for the past several days.  Denies any chest pain.  She is had a nonproductive cough.  Has a history of CHF and states compliance with her diuretics.  She came in tonight because she was having more shortness of breath as well as increased pain and swelling in her legs.  Denies any recent changes to her diuretics.  Has had chills but no documented fever.  No abdominal pain, nausea or vomiting.  No hypoxia.  States she normally has a sleep propped up and this is unchanged.  Has noticed increased swelling and redness to her legs, especially on the left.  The history is provided by the patient and the EMS personnel.  Shortness of Breath  Associated symptoms: no abdominal pain, no chest pain, no headaches and no vomiting     Past Medical History:  Diagnosis Date  . Anemia   . Aortic insufficiency    mild  . Arthritis    "arms" (08/09/2015)  . CAD (coronary artery disease)    cath 08/09/2014 95% stenosis in prox to mid RCA s/p DES, 80-90% prox OM2, 50% distal LAD  . CHF (congestive heart failure) ([place])   . CKD (chronic kidney disease) stage 3, GFR 30-59 ml/min (HCC)   . Colon polyp    2009 colonoscopy, not retrieved for pathology  . Dyspnea   . Ectropion of left lower eyelid   . GERD (gastroesophageal reflux disease) 2009   EGD with benign gastric polyp too  . Hyperlipidemia   . Hypertension   . Pneumonia    history of  . Stroke ([place]) 1975  . Vitamin D deficiency     Patient Active Problem List   Diagnosis Date Noted  . Toenail deformity 03/19/2018  . Skin lesion of scalp 03/19/2018  . CAD (coronary artery disease) 08/25/2017  . Unsteady  gait 08/11/2016  . Abdominal aortic aneurysm ([place]) 05/21/2016  . Trochanteric bursitis of left hip   . CKD (chronic kidney disease) 03/31/2015  . Prediabetes 10/16/2014  . Status post insertion of drug-eluting stent into right coronary artery for coronary artery disease 08/07/2014  . Dyspnea 05/31/2014  . Physical deconditioning 05/31/2014  . Heart failure with preserved ejection fraction ([place]) 12/27/2012  . Ectropion of left lower eyelid 12/17/2012  . Onychomycosis of toenail 09/18/2012  . PAD (peripheral artery disease) ([place]) 09/18/2012  . Preventative health care 09/18/2012  . Vitamin D deficiency 04/25/2008  . History of cerebrovascular accident 10/29/2007  . Hyperlipidemia 09/24/2006  . Normocytic anemia 09/24/2006  . Hypertension 09/24/2006  . OSTEOPENIA 09/24/2006  . Vitamin B12 deficiency 08/29/2006    Past Surgical History:  Procedure Laterality Date  . ABDOMINAL HYSTERECTOMY    . APPENDECTOMY    . ARTERY BIOPSY Left 12/16/2012   Procedure: BIOPSY TEMPORAL ARTERY;  Surgeon: Mal Misty, MD;  Location: [facility];  Service: Vascular;  Laterality: Left;  . CARDIAC CATHETERIZATION    . LEFT HEART CATHETERIZATION WITH CORONARY ANGIOGRAM N/A 08/09/2014   Procedure: LEFT HEART CATHETERIZATION WITH CORONARY ANGIOGRAM;  Surgeon: Peter M Martinique, MD;  Location: [facility] CATH LAB;  Service: Cardiovascular;  Laterality: N/A;  .  PERCUTANEOUS CORONARY STENT INTERVENTION (PCI-S)  08/09/2014   Procedure: PERCUTANEOUS CORONARY STENT INTERVENTION (PCI-S);  Surgeon: Peter M Martinique, MD;  Location: [facility] CATH LAB;  Service: Cardiovascular;;  . TONSILLECTOMY       OB History   No obstetric history on file.      Home Medications    Prior to Admission medications   Medication Sig Start Date End Date Taking? Authorizing Provider  acetaminophen (TYLENOL) 325 MG tablet Take 650 mg by mouth every 6 (six) hours as needed for mild pain, moderate pain or headache.     [provider]  albuterol  (PROVENTIL HFA;VENTOLIN HFA) 108 (90 Base) MCG/ACT inhaler Inhale 1-2 puffs into the lungs every 6 (six) hours as needed for wheezing or shortness of breath. 10/21/17   Colbert Ewing, MD  aspirin EC 81 MG tablet Take 1 tablet (81 mg total) by mouth daily. 10/21/17   Colbert Ewing, MD  atorvastatin (LIPITOR) 40 MG tablet Take 1 tablet (40 mg total) by mouth daily at 6 PM. 10/21/17   Colbert Ewing, MD  bisoprolol (ZEBETA) 5 MG tablet Take 0.5 tablets (2.5 mg total) by mouth daily. 10/21/17   Colbert Ewing, MD  Emollient (LUBRIDERM DAILY MOISTURE) LOTN Apply 1 application topically 2 (two) times daily. Apply topically to dry exfoliative areas of legs    [provider]  furosemide (LASIX) 20 MG tablet Take 1 tablet (20 mg total) by mouth daily. 06/15/18   Masoudi, Elhamalsadat, MD  Hypromellose (NATURAL BALANCE TEARS OP) Apply 1 drop to eye 3 (three) times daily.    [provider]  isosorbide mononitrate (IMDUR) 30 MG 24 hr tablet Take 1 tablet (30 mg total) by mouth daily. 03/04/18   Aldine Contes, MD  losartan (COZAAR) 100 MG tablet Take 1 tablet (100 mg total) by mouth daily. 09/23/17 09/23/18  Jule Ser, DO    Family History Family History  Problem Relation Age of Onset  . Stroke Mother   . Hypertension Mother   . Stroke Father   . Hypertension Father   . Diabetes Sister     Social History Social History   Tobacco Use  . Smoking status: Former Smoker    Types: Cigarettes    Last attempt to quit: 07/02/1979    Years since quitting: 39.0  . Smokeless tobacco: Former Systems developer  . Tobacco comment: Started in teenage years - Quit 1980  Substance Use Topics  . Alcohol use: No    Alcohol/week: 0.0 standard drinks    Comment: Quit alcohol 1975-76  . Drug use: No     Allergies   Patient has no known allergies.   Review of Systems Review of Systems  Constitutional: Negative for activity change and appetite change.  HENT: Negative for congestion and  rhinorrhea.   Eyes: Negative for visual disturbance.  Respiratory: Positive for shortness of breath.   Cardiovascular: Positive for leg swelling. Negative for chest pain.  Gastrointestinal: Negative for abdominal pain, nausea and vomiting.  Genitourinary: Negative for dysuria, hematuria, vaginal bleeding and vaginal discharge.  Musculoskeletal: Positive for arthralgias and myalgias.  Skin: Positive for wound.  Neurological: Negative for dizziness, weakness and headaches.    all other systems are negative except as noted in the HPI and PMH.    Physical Exam Updated Vital Signs BP (!) 139/57   Pulse 61   Temp 98.3 F (36.8 C) (Oral)   Resp (!) 22   SpO2 96%   Physical Exam Vitals signs and nursing note reviewed.  Constitutional:      General: She is not in acute distress.    Appearance: She is well-developed.  HENT:     Head: Normocephalic and atraumatic.     Mouth/Throat:     Pharynx: No oropharyngeal exudate.  Eyes:     Conjunctiva/sclera: Conjunctivae normal.     Pupils: Pupils are equal, round, and reactive to light.  Neck:     Musculoskeletal: Normal range of motion and neck supple.     Comments: No meningismus. Cardiovascular:     Rate and Rhythm: Normal rate and regular rhythm.     Heart sounds: Normal heart sounds. No murmur.  Pulmonary:     Effort: Pulmonary effort is normal. No respiratory distress.     Breath sounds: Normal breath sounds.     Comments: Diminished breath sounds at bases Abdominal:     Palpations: Abdomen is soft.     Tenderness: There is no abdominal tenderness. There is no guarding or rebound.  Musculoskeletal: Normal range of motion.        General: Swelling and tenderness present.     Comments: Hyperkeratotic changes to legs bilaterally.  There is erythema that is circumferential.  Compartments soft.  Intact PT pulses with Doppler.  Skin:    General: Skin is warm.     Capillary Refill: Capillary refill takes less than 2 seconds.      Findings: Erythema and rash present.  Neurological:     Mental Status: She is alert and oriented to person, place, and time.     Cranial Nerves: No cranial nerve deficit.     Motor: No abnormal muscle tone.     Coordination: Coordination normal.     Comments:  5/5 strength throughout. CN 2-12 intact.Equal grip strength.   Psychiatric:        Behavior: Behavior normal.      ED Treatments / Results  Labs (all labs ordered are listed, but only abnormal results are displayed) Labs Reviewed  CBC WITH DIFFERENTIAL/PLATELET - Abnormal; Notable for the following components:      Result Value   WBC 3.1 (*)    RBC 3.07 (*)    Hemoglobin 9.7 (*)    HCT 29.7 (*)    Platelets 124 (*)    Neutro Abs 1.6 (*)    All other components within normal limits  BASIC METABOLIC PANEL - Abnormal; Notable for the following components:   Potassium 2.6 (*)    Calcium 8.1 (*)    GFR calc non Af Amer 51 (*)    GFR calc Af Amer 59 (*)    All other components within normal limits  TROPONIN I - Abnormal; Notable for the following components:   Troponin I 0.03 (*)    All other components within normal limits  BRAIN NATRIURETIC PEPTIDE - Abnormal; Notable for the following components:   B Natriuretic Peptide 382.8 (*)    All other components within normal limits  CULTURE, BLOOD (ROUTINE X 2)  CULTURE, BLOOD (ROUTINE X 2)  LACTIC ACID, PLASMA  LACTIC ACID, PLASMA  MAGNESIUM    EKG EKG Interpretation  Date/Time:  Saturday July 25 2018 03:07:28 EST Ventricular Rate:  69 PR Interval:    QRS Duration: 97 QT Interval:  447 QTC Calculation: 479 R Axis:   18 Text Interpretation:  Sinus rhythm Probable left atrial enlargement Minimal ST depression, lateral leads No significant change was found Confirmed by Ezequiel Essex [phone]) on 07/25/2018 3:24:49 AM   Radiology Dg Chest 2 View  Result Date: 07/25/2018 CLINICAL DATA:  Cough and shortness of breath EXAM: CHEST - 2 VIEW COMPARISON:  09/11/2017  FINDINGS: There is moderate cardiomegaly. No focal airspace consolidation or pulmonary edema. No pleural effusion or pneumothorax. IMPRESSION: Moderate cardiomegaly without focal airspace disease. Electronically Signed   By: Ulyses Jarred M.D.   On: 07/25/2018 04:38    Procedures Procedures (including critical care time)  Medications Ordered in ED Medications  potassium chloride SA (K-DUR,KLOR-CON) CR tablet 40 mEq (has no administration in time range)  potassium chloride 10 mEq in 100 mL IVPB (has no administration in time range)  vancomycin (VANCOCIN) IVPB 1000 mg/200 mL premix (has no administration in time range)  furosemide (LASIX) injection 20 mg (has no administration in time range)     Initial Impression / Assessment and Plan / ED Course  I have reviewed the triage vital signs and the nursing notes.  Pertinent labs & imaging results that were available during my care of the patient were reviewed by me and considered in my medical decision making (see chart for details).    History of CHF presenting with shortness of breath as well as leg pain and leg swelling.  Appears to have cellulitis of her bilateral legs, left greater than right.  No fever.  Pulses are intact.  Labs concerning for severe hypokalemia without EKG changes.  We will also start IV antibiotics for cellulitis.  Patient will be given potassium supplementation.  This is likely secondary to her diuretic use.  Also treat suspected cellulitis with antibiotics.  She does not appear to have any evidence of volume overload and her chest x-ray is clear.  She is speaking full sentences with no distress no hypoxia.  No chest pain.  Given her severe hypokalemia, will admit for supplementation and IV antibiotics.  Discussed with internal medicine residents. Final Clinical Impressions(s) / ED Diagnoses   Final diagnoses:  Shortness of breath  Hypokalemia  Cellulitis of left lower extremity    ED Discharge Orders    None         Nyah Shepherd, Annie Main, MD 07/25/18 [phone]

## 2018-07-25 NOTE — H&P (Signed)
Date: 07/25/2018               Patient Name:  Jane [name] MRN: [id number]  DOB: Apr 23, 1929 Age / Sex: 85 y.o., female   PCP: Ina Homes, MD         Medical Service: Internal Medicine Teaching Service         Attending Physician: Dr. Sid Falcon, MD    First Contact: Dr. Annie Paras Pager: [phone]  Second Contact: Dr. Tarri Abernethy Pager: [phone]       After Hours (After 5p/  First Contact Pager: [phone]  weekends / holidays): Second Contact Pager: [phone]   Chief Complaint: sob  History of Present Illness:  25yoF with dCHF, CKD3, HTN, CAD, GERD, HLD presenting with shortness of breath and left leg redness, swelling, and pain. She notes chronic sob that is usually relieved with her inhaler. However, last night her sob did not get better after using her inhaler and she was unable to get in touch with her neighbor who usually helps her so she got anxious and came to the ED. She denies associated chest pain, nausea, wheezing, cough. She is compliant with her medications and takes lasix every morning. She denies orthopnea. She uses a walker at home and denies difficulty walking due to sob.  She has also noted increased left leg swelling and pain. She has chronic skin changes and leg pain 2/2 PAD but feels her pain is worse. Unable to tell me the duration of her worsening pain. Her swelling has resolved by the time of my evaluation. She denies fevers, chills, n/v/d.   In the ED she was found to be hypokalemic to 2.6. Started on IV replacement and also given vancomycin.   Meds:  Current Meds  Medication Sig  . acetaminophen (TYLENOL) 325 MG tablet Take 650 mg by mouth every 6 (six) hours as needed for mild pain, moderate pain or headache.   . albuterol (PROVENTIL HFA;VENTOLIN HFA) 108 (90 Base) MCG/ACT inhaler Inhale 1-2 puffs into the lungs every 6 (six) hours as needed for wheezing or shortness of breath.  Marland Kitchen aspirin EC 81 MG tablet Take 1 tablet (81 mg total) by mouth daily.  Marland Kitchen  atorvastatin (LIPITOR) 40 MG tablet Take 1 tablet (40 mg total) by mouth daily at 6 PM.  . bisoprolol (ZEBETA) 5 MG tablet Take 0.5 tablets (2.5 mg total) by mouth daily.  . furosemide (LASIX) 20 MG tablet Take 1 tablet (20 mg total) by mouth daily.  . isosorbide mononitrate (IMDUR) 30 MG 24 hr tablet Take 1 tablet (30 mg total) by mouth daily.  Marland Kitchen losartan (COZAAR) 100 MG tablet Take 1 tablet (100 mg total) by mouth daily.     Allergies: Allergies as of 07/25/2018  . (No Known Allergies)   Past Medical History:  Diagnosis Date  . Anemia   . Aortic insufficiency    mild  . Arthritis    "arms" (08/09/2015)  . CAD (coronary artery disease)    cath 08/09/2014 95% stenosis in prox to mid RCA s/p DES, 80-90% prox OM2, 50% distal LAD  . CHF (congestive heart failure) ([place])   . CKD (chronic kidney disease) stage 3, GFR 30-59 ml/min (HCC)   . Colon polyp    2009 colonoscopy, not retrieved for pathology  . Dyspnea   . Ectropion of left lower eyelid   . GERD (gastroesophageal reflux disease) 2009   EGD with benign gastric polyp too  . Hyperlipidemia   . Hypertension   .  Pneumonia    history of  . Stroke ([place]) 1975  . Vitamin D deficiency     Family History:  Family History  Problem Relation Age of Onset  . Stroke Mother   . Hypertension Mother   . Stroke Father   . Hypertension Father   . Diabetes Sister      Social History: lives alone in one bedroom apartment. Has [employer] aides who come several times a week. She manages her own medications. Nephew helps with grocery shopping. Also has son and daughter in law nearby. Former smoker, quit 1981. No alcohol or drug use.   Review of Systems: A complete ROS was negative except as per HPI.   Physical Exam: Blood pressure (!) 170/73, pulse 81, temperature 98.7 F (37.1 C), temperature source Oral, resp. rate 18, SpO2 97 %. Vitals:   07/25/18 0430 07/25/18 0445 07/25/18 0500 07/25/18 0601  BP: (!) 150/59 (!) 144/71 (!) 170/73   Pulse:  73 64 81   Resp: (!) 30 18 18    Temp:    98.7 F (37.1 C)  TempSrc:    Oral  SpO2: 97% 99% 97%    General: Vital signs reviewed.  Patient is frail and chronically ill appearing, in no acute distress and cooperative with exam.  Head: Normocephalic and atraumatic. Neck: Supple, trachea midline, normal ROM, no JVD Cardiovascular: RRR, S1 normal, S2 normal, no murmurs, gallops, or rubs. Pulmonary/Chest: Clear to auscultation bilaterally, no wheezes, rales, or rhonchi. Abdominal: Soft, non-tender, non-distended, BS +, no masses, organomegaly, or guarding present.  Musculoskeletal: No joint deformities, erythema, or stiffness, ROM full and nontender. Extremities: see picture below. Left leg more erythematous and TTP. No swelling of either leg. No open wounds or purulence. No increased warmth of either leg. DP pulses intact with doppler.  Neurological: A&O x3 although sometimes forgets what you just told her, Strength is overall deconditioned but normal and symmetric bilaterally, cranial nerve II-XII are grossly intact, no focal motor deficit, sensory intact to light touch bilaterally.  Psychiatric: Normal mood and affect. speech and behavior is normal. Cognition and memory are normal.      Labs: Na 142 K 2.6 Creatine 0.99 Ca 8.1  WBC 3.1 hgb 9.7 (at baseline) MCV 96.7 Plts 124  BNP 382  EKG: personally reviewed my interpretation is NSR HR 69, no ST or T wave changes   CXR: personally reviewed my interpretation is cardiomegaly, no pulmonary consolidation or effusion   Assessment & Plan by Problem: Active Problems:   PAD (peripheral artery disease) (HCC)   Hypokalemia   Cellulitis of leg, left  89yoF with dCHF, CKD3, HTN, CAD, GERD, HLD presenting with shortness of breath and left leg redness, swelling, and pain. Sob resolved. Left leg changes concerning for cellulitis.   Hypokalemia: K 2.6, Mg 1.3, Most likely 2/2 diuretic use. Denies significant diarrhea. No ekg changes  -  s/p 25meq IV and 40 meq po - 4g IV Mg  Left leg cellulitis, PAD: ABIs in 2017 showed moderate arterial occlusive disease. Legs are symmetrically warm on my exam. DP pulses intact with ultrasound. PAD is definitely confounding her presentation. However, given the increased redness and pain, it is reasonable to treat with antibiotics for cellulitis.  - s/p vancomycin in the ED - no systemic signs of infection - can most likely transition to po regiment tomorrow   Shortness of Breath: Resolved without intervention. She was breathing and speaking comfortably on RA. Euvolemic on exam.  - albuterol prn   CHF:  recent echo showed EF 55-60%, no wall motion abnls, g1dd, mild AR - euvolemic on exam  - holding home lasix in the setting of hypokalemia   HLD: continue home atorvastatin, aspirin  HTN: Hypertensive. Continue home losartan, imdur, and bisoprolol  DVT ppx: lovenox  Code: full, patient would like to discuss this more with family  Diet: HH  Dispo: Admit patient to Observation with expected length of stay less than 2 midnights.  Signed: Isabelle Course, MD 07/25/2018, 6:24 AM  Pager: [phone]

## 2018-07-25 NOTE — Evaluation (Signed)
Physical Therapy Evaluation Patient Details Name: Jane [name] MRN: [id number] DOB: [birth year] Today's Date: 07/25/2018   History of Present Illness  14yoF with dCHF, CKD3, HTN, CAD, GERD, HLD presenting with shortness of breath and left leg redness, swelling, and pain. Sob resolved. Left leg changes concerning for cellulitis.  Clinical Impression  Orders received for PT evaluation. Patient demonstrates deficits in functional mobility as indicated below. Will benefit from continued skilled PT to address deficits and maximize function. Will see as indicated and progress as tolerated.  At this time, patient not cognitively or functionally safe to d/c home alone. Will need 24/7 physical assist due to increased pain in bilateral LEs and poor mobility function. Patient also fearful of being d/c home saying she knows she will come right back. Also, some concerns regarding patient's ability to mange her own medications given her cognitive presentation. Recommending SNF or 24/7 physical assist upon discharge.    Follow Up Recommendations SNF;Supervision/Assistance - 24 hour    Equipment Recommendations  None recommended by PT    Recommendations for Other Services       Precautions / Restrictions Precautions Precautions: Fall      Mobility  Bed Mobility Overal bed mobility: Needs Assistance Bed Mobility: Rolling;Supine to Sit;Sit to Supine Rolling: Supervision   Supine to sit: Min assist Sit to supine: Min assist   General bed mobility comments: min assist for elevation of trunk and LEs with bed mobility  Transfers Overall transfer level: Needs assistance Equipment used: Rolling walker (2 wheeled) Transfers: Sit to/from Stand Sit to Stand: Min assist         General transfer comment: min assist to power up to standing due to bilateral LE painL> R  Ambulation/Gait Ambulation/Gait assistance: Min assist Gait Distance (Feet): 12 Feet Assistive device: Rolling walker (2  wheeled) Gait Pattern/deviations: Antalgic;Trunk flexed Gait velocity: decreased Gait velocity interpretation: <1.31 ft/sec, indicative of household ambulator General Gait Details: patient extremely limited with activity tolerance due to pain in bilateral LEs  Stairs            Wheelchair Mobility    Modified Rankin (Stroke Patients Only)       Balance                                             Pertinent Vitals/Pain Pain Assessment: Faces Faces Pain Scale: Hurts even more Pain Location: BLE Pain Descriptors / Indicators: Sore Pain Intervention(s): Monitored during session    Home Living Family/patient expects to be discharged to:: Assisted living(vs independent living [facility])) Living Arrangements: Alone Available Help at Discharge: Family;Friend(s);Available PRN/intermittently Type of Home: Apartment Home Access: Elevator     Home Layout: One level Home Equipment: Walker - 2 wheels;Tub bench;Grab bars - tub/shower;Grab bars - toilet      Prior Function Level of Independence: Needs assistance   Gait / Transfers Assistance Needed: Furniture walks in apartment, uses RW for community mobility  ADL's / Homemaking Assistance Needed: sponge bathes, makes light meals and performs light housekeeping        Hand Dominance   Dominant Hand: Right    Extremity/Trunk Assessment   Upper Extremity Assessment Upper Extremity Assessment: Generalized weakness    Lower Extremity Assessment Lower Extremity Assessment: Generalized weakness       Communication   Communication: HOH  Cognition Arousal/Alertness: Awake/alert Behavior During Therapy: WFL for tasks assessed/performed Overall  Cognitive Status: Impaired/Different from baseline Area of Impairment: Orientation;Memory;Following commands;Safety/judgement;Awareness;Problem solving                 Orientation Level: Disoriented to;Time;Situation   Memory: Decreased short-term  memory Following Commands: Follows one step commands with increased time Safety/Judgement: Decreased awareness of safety;Decreased awareness of deficits Awareness: Emergent Problem Solving: Slow processing;Decreased initiation;Difficulty sequencing;Requires verbal cues;Requires tactile cues General Comments: patient very repetitive and perseverating on her neightbor "Betty" and forgetfull that she is at the hospital and not at home      General Comments      Exercises     Assessment/Plan    PT Assessment    PT Problem List         PT Treatment Interventions      PT Goals (Current goals can be found in the Care Plan section)  Acute Rehab PT Goals Patient Stated Goal: to get better PT Goal Formulation: With patient Time For Goal Achievement: 08/08/18 Potential to Achieve Goals: Good    Frequency     Barriers to discharge        Co-evaluation               AM-PAC PT "6 Clicks" Mobility  Outcome Measure Help needed turning from your back to your side while in a flat bed without using bedrails?: A Little Help needed moving from lying on your back to sitting on the side of a flat bed without using bedrails?: A Little Help needed moving to and from a bed to a chair (including a wheelchair)?: A Lot Help needed standing up from a chair using your arms (e.g., wheelchair or bedside chair)?: A Lot Help needed to walk in hospital room?: A Little Help needed climbing 3-5 steps with a railing? : Total 6 Click Score: 14    End of Session              Time: 2836-6294 PT Time Calculation (min) (ACUTE ONLY): 21 min   Charges:   PT Evaluation $PT Eval Moderate Complexity: [place], PT DPT  Board Certified Neurologic Specialist Acute Rehabilitation Services Pager [phone] Office [place] 07/25/2018, 4:54 PM

## 2018-07-25 NOTE — ED Notes (Signed)
DP and PT pulses checked and marked.  Unable to pick of dorsalis pedis on left foot.

## 2018-07-25 NOTE — ED Triage Notes (Signed)
Patient is from home with shortness of breath.  No chest pain, no dizziness or nausea or vomiting.  Patient has history of CHF.  Patient CAOx3 at baseline.
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
## 2018-07-25 NOTE — Progress Notes (Signed)
PHARMACY NOTE:  ANTIMICROBIAL RENAL DOSAGE ADJUSTMENT  Current antimicrobial regimen includes a mismatch between antimicrobial dosage and estimated renal function.  As per policy approved by the Pharmacy & Therapeutics and Medical Executive Committees, the antimicrobial dosage will be adjusted accordingly.  Current antimicrobial dosage:  Cephalexin 500 mg PO q6h  Indication: Cellulitis  Renal Function:  Estimated Creatinine Clearance: 34.1 mL/min (by C-G formula based on SCr of 0.99 mg/dL).     Antimicrobial dosage has been changed to:  Cephalexin 500 mg PO q12h   Thank you for allowing pharmacy to be a part of this patient's care.  Jackson Latino, PharmD PGY1 Pharmacy Resident Phone [phone] 07/25/2018     8:17 AM

## 2018-07-26 DIAGNOSIS — M1990 Unspecified osteoarthritis, unspecified site: Secondary | ICD-10-CM | POA: Diagnosis not present

## 2018-07-26 DIAGNOSIS — R0600 Dyspnea, unspecified: Secondary | ICD-10-CM | POA: Diagnosis not present

## 2018-07-26 DIAGNOSIS — L0390 Cellulitis, unspecified: Secondary | ICD-10-CM | POA: Diagnosis not present

## 2018-07-26 DIAGNOSIS — R1030 Lower abdominal pain, unspecified: Secondary | ICD-10-CM | POA: Diagnosis not present

## 2018-07-26 DIAGNOSIS — R239 Unspecified skin changes: Secondary | ICD-10-CM | POA: Diagnosis not present

## 2018-07-26 DIAGNOSIS — Z90710 Acquired absence of both cervix and uterus: Secondary | ICD-10-CM | POA: Diagnosis not present

## 2018-07-26 DIAGNOSIS — Z7401 Bed confinement status: Secondary | ICD-10-CM | POA: Diagnosis not present

## 2018-07-26 DIAGNOSIS — E785 Hyperlipidemia, unspecified: Secondary | ICD-10-CM | POA: Diagnosis not present

## 2018-07-26 DIAGNOSIS — R2681 Unsteadiness on feet: Secondary | ICD-10-CM | POA: Diagnosis not present

## 2018-07-26 DIAGNOSIS — Z8673 Personal history of transient ischemic attack (TIA), and cerebral infarction without residual deficits: Secondary | ICD-10-CM | POA: Diagnosis not present

## 2018-07-26 DIAGNOSIS — H9190 Unspecified hearing loss, unspecified ear: Secondary | ICD-10-CM | POA: Diagnosis present

## 2018-07-26 DIAGNOSIS — L905 Scar conditions and fibrosis of skin: Secondary | ICD-10-CM | POA: Diagnosis not present

## 2018-07-26 DIAGNOSIS — R2689 Other abnormalities of gait and mobility: Secondary | ICD-10-CM | POA: Diagnosis not present

## 2018-07-26 DIAGNOSIS — I872 Venous insufficiency (chronic) (peripheral): Secondary | ICD-10-CM | POA: Diagnosis present

## 2018-07-26 DIAGNOSIS — J449 Chronic obstructive pulmonary disease, unspecified: Secondary | ICD-10-CM | POA: Diagnosis not present

## 2018-07-26 DIAGNOSIS — I130 Hypertensive heart and chronic kidney disease with heart failure and stage 1 through stage 4 chronic kidney disease, or unspecified chronic kidney disease: Secondary | ICD-10-CM | POA: Diagnosis not present

## 2018-07-26 DIAGNOSIS — M2550 Pain in unspecified joint: Secondary | ICD-10-CM | POA: Diagnosis not present

## 2018-07-26 DIAGNOSIS — I10 Essential (primary) hypertension: Secondary | ICD-10-CM | POA: Diagnosis not present

## 2018-07-26 DIAGNOSIS — L03115 Cellulitis of right lower limb: Secondary | ICD-10-CM | POA: Diagnosis present

## 2018-07-26 DIAGNOSIS — F419 Anxiety disorder, unspecified: Secondary | ICD-10-CM | POA: Diagnosis present

## 2018-07-26 DIAGNOSIS — Z79899 Other long term (current) drug therapy: Secondary | ICD-10-CM | POA: Diagnosis not present

## 2018-07-26 DIAGNOSIS — Z7982 Long term (current) use of aspirin: Secondary | ICD-10-CM | POA: Diagnosis not present

## 2018-07-26 DIAGNOSIS — I2510 Atherosclerotic heart disease of native coronary artery without angina pectoris: Secondary | ICD-10-CM | POA: Diagnosis not present

## 2018-07-26 DIAGNOSIS — R7989 Other specified abnormal findings of blood chemistry: Secondary | ICD-10-CM | POA: Diagnosis not present

## 2018-07-26 DIAGNOSIS — E876 Hypokalemia: Secondary | ICD-10-CM | POA: Diagnosis not present

## 2018-07-26 DIAGNOSIS — I5032 Chronic diastolic (congestive) heart failure: Secondary | ICD-10-CM | POA: Diagnosis present

## 2018-07-26 DIAGNOSIS — N183 Chronic kidney disease, stage 3 (moderate): Secondary | ICD-10-CM | POA: Diagnosis not present

## 2018-07-26 DIAGNOSIS — I714 Abdominal aortic aneurysm, without rupture: Secondary | ICD-10-CM | POA: Diagnosis present

## 2018-07-26 DIAGNOSIS — M6281 Muscle weakness (generalized): Secondary | ICD-10-CM | POA: Diagnosis not present

## 2018-07-26 DIAGNOSIS — L03116 Cellulitis of left lower limb: Secondary | ICD-10-CM | POA: Diagnosis not present

## 2018-07-26 DIAGNOSIS — R0602 Shortness of breath: Secondary | ICD-10-CM | POA: Diagnosis not present

## 2018-07-26 DIAGNOSIS — I351 Nonrheumatic aortic (valve) insufficiency: Secondary | ICD-10-CM | POA: Diagnosis present

## 2018-07-26 DIAGNOSIS — D631 Anemia in chronic kidney disease: Secondary | ICD-10-CM | POA: Diagnosis present

## 2018-07-26 DIAGNOSIS — I739 Peripheral vascular disease, unspecified: Secondary | ICD-10-CM | POA: Diagnosis not present

## 2018-07-26 DIAGNOSIS — R1312 Dysphagia, oropharyngeal phase: Secondary | ICD-10-CM | POA: Diagnosis not present

## 2018-07-26 DIAGNOSIS — Z955 Presence of coronary angioplasty implant and graft: Secondary | ICD-10-CM | POA: Diagnosis not present

## 2018-07-26 DIAGNOSIS — K219 Gastro-esophageal reflux disease without esophagitis: Secondary | ICD-10-CM | POA: Diagnosis not present

## 2018-07-26 DIAGNOSIS — I5030 Unspecified diastolic (congestive) heart failure: Secondary | ICD-10-CM | POA: Diagnosis not present

## 2018-07-26 DIAGNOSIS — R41841 Cognitive communication deficit: Secondary | ICD-10-CM | POA: Diagnosis not present

## 2018-07-26 DIAGNOSIS — R21 Rash and other nonspecific skin eruption: Secondary | ICD-10-CM | POA: Diagnosis present

## 2018-07-26 DIAGNOSIS — R5381 Other malaise: Secondary | ICD-10-CM | POA: Diagnosis not present

## 2018-07-26 LAB — COMPREHENSIVE METABOLIC PANEL
ALK PHOS: 65 U/L (ref 38–126)
ALT: 11 U/L (ref 0–44)
AST: 20 U/L (ref 15–41)
Albumin: 3 g/dL — ABNORMAL LOW (ref 3.5–5.0)
Anion gap: 8 (ref 5–15)
BUN: 14 mg/dL (ref 8–23)
CALCIUM: 8.2 mg/dL — AB (ref 8.9–10.3)
CO2: 22 mmol/L (ref 22–32)
Chloride: 109 mmol/L (ref 98–111)
Creatinine, Ser: 1.09 mg/dL — ABNORMAL HIGH (ref 0.44–1.00)
GFR calc Af Amer: 53 mL/min — ABNORMAL LOW (ref >60)
GFR calc non Af Amer: 46 mL/min — ABNORMAL LOW (ref >60)
Glucose, Bld: 98 mg/dL (ref 70–99)
Potassium: 3.2 mmol/L — ABNORMAL LOW (ref 3.5–5.1)
Sodium: 139 mmol/L (ref 135–145)
Total Bilirubin: 1.3 mg/dL — ABNORMAL HIGH (ref 0.3–1.2)
Total Protein: 5.7 g/dL — ABNORMAL LOW (ref 6.5–8.1)

## 2018-07-26 LAB — CBC
HCT: 28.9 % — ABNORMAL LOW (ref 36.0–46.0)
HEMOGLOBIN: 9.5 g/dL — AB (ref 12.0–15.0)
MCH: 31.3 pg (ref 26.0–34.0)
MCHC: 32.9 g/dL (ref 30.0–36.0)
MCV: 95.1 fL (ref 80.0–100.0)
Platelets: 114 10*3/uL — ABNORMAL LOW (ref 150–400)
RBC: 3.04 MIL/uL — AB (ref 3.87–5.11)
RDW: 12.9 % (ref 11.5–15.5)
WBC: 3.8 10*3/uL — ABNORMAL LOW (ref 4.0–10.5)
nRBC: 0 % (ref 0.0–0.2)

## 2018-07-26 LAB — MAGNESIUM: Magnesium: 2.4 mg/dL (ref 1.7–2.4)

## 2018-07-26 MED ORDER — POTASSIUM CHLORIDE CRYS ER 20 MEQ PO TBCR
40.0000 meq | EXTENDED_RELEASE_TABLET | Freq: Two times a day (BID) | ORAL | Status: AC
  Administered 2018-07-26 (×2): 40 meq via ORAL
  Filled 2018-07-26 (×2): qty 2

## 2018-07-26 NOTE — Progress Notes (Signed)
  Date: 07/26/2018  Patient name: Jane [name]  Medical record number: [id number]  Date of birth: 05-01-1929   This patient's plan of care was discussed with the house staff. Please see their note for complete details. I concur with their findings.   Sid Falcon, MD 07/26/2018, 11:20 PM

## 2018-07-26 NOTE — Clinical Social Work Note (Signed)
Clinical Social Work Assessment  Patient Details  Name: Jane [name] MRN: [id number] Date of Birth: [birth year]  Date of referral:  07/26/18               Reason for consult:  Facility Placement, Discharge Planning                Permission sought to share information with:    Permission granted to share information::  No  Name::        Agency::     Relationship::     Contact Information:     Housing/Transportation Living arrangements for the past 2 months:  Apartment Source of Information:  Patient, Medical Team Patient Interpreter Needed:  None Criminal Activity/Legal Involvement Pertinent to Current Situation/Hospitalization:  No - Comment as needed Significant Relationships:  Adult Children, Other Family Members, Other(Comment)(Nephews and nieces.) Lives with:  Self Do you feel safe going back to the place where you live?  Yes Need for family participation in patient care:  Yes (Comment)  Care giving concerns:  PT recommending SNF once medically stable for discharge.   Social Worker assessment / plan:  CSW met with patient. No supports at bedside. CSW introduced role and explained that PT recommendations would be discussed. Patient is not interested in SNF placement. She said that the last time she went to SNF, she was told she had no choice and had to go. Noted family concerns for her being home alone. Patient reports her family members try to make her decisions for her. Patient reports her apartment is small enough that she can grab onto things if needed. She says she has to remind herself to slow down sometimes. Patient reports wearing Depends because her medication makes her have to urinate frequently. Patient had gone to [facility] in the past and said she does not want to go there again because that is where her brother past away not long ago. Patient lives home alone but reports making her own meals (she does not add any extra salt) and taking her own medications. Patient has  two nephews that assist her as needed as well as a neighbor down the hall Engineer, manufacturing). Patient's nephews go to the grocery store for her and bring her take out. Patient reports a family history of hypertension and stroke. Her father passed away from a stroke when she was 85 years old. She does not remember him. Patient reports getting home health services prior to admission with [facility]. RNCM aware. MD is aware of conversation with patient. No further concerns. CSW encouraged patient to contact CSW as needed. CSW will continue to follow patient in case she changes her mind and decides to go to SNF.  Employment status:  Retired Forensic scientist:  Medicare PT Recommendations:  [place] / Referral to community resources:  [place]  Patient/Family's Response to care:  Patient prefers home health. Patient's family supportive and involved in patient's care. Patient appreciated social work intervention.  Patient/Family's Understanding of and Emotional Response to Diagnosis, Current Treatment, and Prognosis:  Patient has a good understanding of the reason for admission and her need for continued therapy after discharge. Patient appears happy with hospital care.  Emotional Assessment Appearance:  Appears stated age Attitude/Demeanor/Rapport:  Engaged, Gracious Affect (typically observed):  Appropriate, Calm, Pleasant Orientation:  Oriented to Self, Oriented to Place, Oriented to  Time, Oriented to Situation Alcohol / Substance use:  Never Used Psych involvement (Current and /or in the community):  No (Comment)  Discharge Needs  Concerns to be addressed:  Care Coordination Readmission within the last 30 days:  No Current discharge risk:  Dependent with Mobility, Lives alone Barriers to Discharge:  Continued Medical Work up   Candie Chroman, LCSW 07/26/2018, 10:56 AM
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
## 2018-07-27 ENCOUNTER — Other Ambulatory Visit: Payer: Self-pay | Admitting: *Deleted

## 2018-07-27 ENCOUNTER — Ambulatory Visit: Payer: Self-pay

## 2018-07-27 DIAGNOSIS — R239 Unspecified skin changes: Secondary | ICD-10-CM

## 2018-07-27 LAB — BASIC METABOLIC PANEL
Anion gap: 8 (ref 5–15)
BUN: 21 mg/dL (ref 8–23)
CHLORIDE: 107 mmol/L (ref 98–111)
CO2: 22 mmol/L (ref 22–32)
Calcium: 8.2 mg/dL — ABNORMAL LOW (ref 8.9–10.3)
Creatinine, Ser: 1.14 mg/dL — ABNORMAL HIGH (ref 0.44–1.00)
GFR calc Af Amer: 50 mL/min — ABNORMAL LOW (ref >60)
GFR calc non Af Amer: 43 mL/min — ABNORMAL LOW (ref >60)
Glucose, Bld: 92 mg/dL (ref 70–99)
Potassium: 4.1 mmol/L (ref 3.5–5.1)
Sodium: 137 mmol/L (ref 135–145)

## 2018-07-27 NOTE — Consult Note (Signed)
   [facility] CM Inpatient Consult   07/27/2018  Jane [name] [birth year] [id number]   Patient screened for potential [facility] Care Management services due to unplanned readmission risk score of 23%, high. Patient had been active with a [facility].   Went to bedside to speak with Jane [name] to assess her need for community services. Patient states that she does well at home by herself. States that she lives at [facility] and "I take care of myself fairly well."  Jane [name] states that her nephew drives her to her medical appointments and she gets her medications from [facility]. Denies having trouble obtaining medications. Patient states that she uses a walker at home.  Explained the [facility] Management services to patient. Jane [name] declines services at this time.   Jane Cedars, MSN, [facility] Liaison Nurse Mobile Phone [phone]  Toll free office [phone]

## 2018-07-27 NOTE — Progress Notes (Signed)
Internal Medicine Attending Attestation:   I have seen and evaluated this patient and I have discussed the plan of care with the house staff. Please see their note for complete details.  Oda Kilts, MD 07/27/2018, 4:22 PM      Subjective: Patient reports she did not sleep well because of chronic pain in her sides and back. She does not want to go to a rehab facility because she has been before and her brother passed away there. She does not think she needs to go to a facility. She states she does not feel good right now because she just woke up and that she has not had enough time to consider whether she'll go to a SNF or back home. She would like a coffee and more time to think this issue over.  Objective:  Vital signs in last 24 hours: Vitals:   07/26/18 0951 07/26/18 1125 07/26/18 1915 07/27/18 0317  BP: 138/61 (!) 142/67 (!) 155/66 132/60  Pulse: 84 79 81 88  Resp:  20 18 18   Temp:  98.1 F (36.7 C) 97.7 F (36.5 C) (!) 97.4 F (36.3 C)  TempSrc:  Oral Oral Oral  SpO2: 97% 100% 99% 98%  Weight:    68.6 kg  Height:       Gen: laying comfortably in bed, no distress CV: RRR, no murmurs Ext: No edema. Chronic skin discoloration on the left leg with overlying erythema to the mid-shin. The left leg is less warm than yesterday. Left leg is ttp. Right leg also has some chronic skin changes, but more faint than the left. No erythema or tenderness on the right  Assessment/Plan:  Principal Problem:   Cellulitis of left lower extremity Active Problems:   PAD (peripheral artery disease) (HCC)   Hypokalemia   Cellulitis of leg, left   Cellulitis  Jane [name] is a89yoF with dCHF, CKD3, HTN, CAD, GERD, HLD presenting with shortness of breathand left leg redness, swelling, and pain. Left leg changes concerning for cellulitis.  Left leg cellulitis - Continues to have erythema and tenderness to palpation - Blood cx without growth x1 day - PT recommends SNF. Patient's  family prefer SNF. Nursing staff also recommends SNF because the patient seemed weak and confused this morning.  Plan  - PO keflex for nonpurulent cellulitis (day 3 of 5) - Discuss discharge options with patient and family (SNF vs. Home with home health/PT)  Hypokalemia&Hypomagnesemia - Normal electrolytes today - Continue to monitor   Shortness of Breath - Dyspnea has resolved - Feels her SOB is related to anxiety  -Continuealbuterol prn  CHF - Recent echo showed EF 55-60%, no wall motion abnls, g1dd, mild AR -Euvolemic on exam - Troponin 0.03, EKG unchanged compared to prior -Holding home lasix in the setting of hypokalemia   HLD: continue home atorvastatin, aspirin  DJM:[id number]. Continue home losartan, imdur, and bisoprolol  Dispo: Anticipated discharge in approximately 1 day(s).   Dorrell, Andree Elk, MD 07/27/2018, 6:17 AM Pager: [phone]

## 2018-07-27 NOTE — Clinical Social Work Note (Signed)
Patient still undecided about SNF. She will consider it and let CSW know decision in the morning. She said if she does go to SNF, Jane [name] would be the only one she would want to go to. Provided her with CMS Medicare scores for facilities within 25 miles of her zip code. She gave CSW permission to go ahead and send referral to [facility]. MD aware.  Dayton Scrape, [place]

## 2018-07-27 NOTE — NC FL2 (Signed)
[place] LEVEL OF CARE SCREENING TOOL     IDENTIFICATION  Patient Name: Jane [name] Birthdate: 09-May-1929 Sex: female Admission Date (Current Location): 07/25/2018  [facility] and [place] Number:  Herbalist and Address:  [facility], [place] [address] [place], [place] [address]      Provider Number: [id number]  Attending Physician Name and Address:  Oda Kilts, MD  Relative Name and Phone Number:       Current Level of Care: Hospital Recommended Level of Care: [place] Prior Approval Number:    Date Approved/Denied:   PASRR Number: [id number]  Discharge Plan: SNF    Current Diagnoses: Patient Active Problem List   Diagnosis Date Noted  . Cellulitis 07/26/2018  . Acute on chronic heart failure with preserved ejection fraction (HFpEF) ([place]) 07/25/2018  . Hypokalemia 07/25/2018  . Cellulitis of leg, left 07/25/2018  . Cellulitis of left lower extremity   . Toenail deformity 03/19/2018  . Skin lesion of scalp 03/19/2018  . CAD (coronary artery disease) 08/25/2017  . Unsteady gait 08/11/2016  . Abdominal aortic aneurysm ([place]) 05/21/2016  . Trochanteric bursitis of left hip   . CKD (chronic kidney disease) 03/31/2015  . Prediabetes 10/16/2014  . Status post insertion of drug-eluting stent into right coronary artery for coronary artery disease 08/07/2014  . Dyspnea 05/31/2014  . Physical deconditioning 05/31/2014  . Heart failure with preserved ejection fraction ([place]) 12/27/2012  . Ectropion of left lower eyelid 12/17/2012  . Onychomycosis of toenail 09/18/2012  . PAD (peripheral artery disease) ([place]) 09/18/2012  . Preventative health care 09/18/2012  . Vitamin D deficiency 04/25/2008  . History of cerebrovascular accident 10/29/2007  . Hyperlipidemia 09/24/2006  . Normocytic anemia 09/24/2006  . Hypertension 09/24/2006  . OSTEOPENIA 09/24/2006  . Vitamin B12 deficiency 08/29/2006     Orientation RESPIRATION BLADDER Height & Weight     Self, Time, Situation, Place  Normal Continent Weight: 151 lb 3.2 oz (68.6 kg)(scale b) Height:  5' (152.4 cm)  BEHAVIORAL SYMPTOMS/MOOD NEUROLOGICAL BOWEL NUTRITION STATUS  (None) (None) Continent Diet(Heart healthy)  AMBULATORY STATUS COMMUNICATION OF NEEDS Skin   Limited Assist Verbally Bruising, Other (Comment)(Cellulitis, Excoriated.)                       Personal Care Assistance Level of Assistance              Functional Limitations Info  Sight, Hearing, Speech Sight Info: Adequate Hearing Info: Adequate Speech Info: Adequate    SPECIAL CARE FACTORS FREQUENCY  PT (By licensed PT)     PT Frequency: 5 x week              Contractures Contractures Info: Not present    Additional Factors Info  Code Status, Allergies Code Status Info: Full code Allergies Info: NKDA           Current Medications (07/27/2018):  This is the current hospital active medication list Current Facility-Administered Medications  Medication Dose Route Frequency Provider Last Rate Last Dose  . acetaminophen (TYLENOL) tablet 650 mg  650 mg Oral Q6H PRN Katherine Roan, MD   650 mg at 07/27/18 1143   Or  . acetaminophen (TYLENOL) suppository 650 mg  650 mg Rectal Q6H PRN Katherine Roan, MD      . aspirin EC tablet 81 mg  81 mg Oral Daily Katherine Roan, MD   81 mg at 07/27/18 0958  .  atorvastatin (LIPITOR) tablet 40 mg  40 mg Oral q1800 Katherine Roan, MD   40 mg at 07/26/18 1736  . bisoprolol (ZEBETA) tablet 2.5 mg  2.5 mg Oral Daily Katherine Roan, MD   2.5 mg at 07/27/18 0959  . cephALEXin (KEFLEX) capsule 500 mg  500 mg Oral Q12H Lore, Melissa A, RPH   500 mg at 07/27/18 0958  . enoxaparin (LOVENOX) injection 40 mg  40 mg Subcutaneous Q24H Katherine Roan, MD   40 mg at 07/27/18 1402  . isosorbide mononitrate (IMDUR) 24 hr tablet 30 mg  30 mg Oral Daily Katherine Roan, MD   30 mg at 07/27/18 0959   . losartan (COZAAR) tablet 100 mg  100 mg Oral Daily Katherine Roan, MD   100 mg at 07/27/18 1749     Discharge Medications: Please see discharge summary for a list of discharge medications.  Relevant Imaging Results:  Relevant Lab Results:   Additional Information SS#: [id number]  Candie Chroman, LCSW

## 2018-07-27 NOTE — Clinical Social Work Placement (Signed)
   CLINICAL SOCIAL WORK PLACEMENT  NOTE  Date:  07/27/2018  Patient Details  Name: Jane [name] MRN: [id number] Date of Birth: 21-Dec-1928  Clinical Social Work is seeking post-discharge placement for this patient at the [place] level of care (*CSW will initial, date and re-position this form in  chart as items are completed):      Patient/family provided with [place] Work Department's list of facilities offering this level of care within the geographic area requested by the patient (or if unable, by the patient's family).      Patient/family informed of their freedom to choose among providers that offer the needed level of care, that participate in Medicare, Medicaid or managed care program needed by the patient, have an available bed and are willing to accept the patient.      Patient/family informed of [facility]'s ownership interest in [facility] and [facility], as well as of the fact that they are under no obligation to receive care at these facilities.  PASRR submitted to EDS on 07/27/18     PASRR number received on       Existing PASRR number confirmed on 07/27/18     FL2 transmitted to all facilities in geographic area requested by pt/family on 07/27/18     FL2 transmitted to all facilities within larger geographic area on       Patient informed that his/her managed care company has contracts with or will negotiate with certain facilities, including the following:            Patient/family informed of bed offers received.  Patient chooses bed at       Physician recommends and patient chooses bed at      Patient to be transferred to   on  .  Patient to be transferred to facility by       Patient family notified on   of transfer.  Name of family member notified:        PHYSICIAN Please sign FL2     Additional Comment:    _______________________________________________ Candie Chroman, LCSW 07/27/2018, 3:57  PM

## 2018-07-27 NOTE — Progress Notes (Signed)
After extensive discussions with Ms. Keel, she agrees to consider SNF if it can be arranged for her to go to her home and gather her things before going to SNF. Per social work, this is possible if family transports her to the facility. Her nephew [name] agrees with SNF. He would likely be the family member to transport her. His phone number is [phone].

## 2018-07-28 DIAGNOSIS — R1030 Lower abdominal pain, unspecified: Secondary | ICD-10-CM

## 2018-07-28 DIAGNOSIS — R7989 Other specified abnormal findings of blood chemistry: Secondary | ICD-10-CM

## 2018-07-28 LAB — BASIC METABOLIC PANEL
Anion gap: 7 (ref 5–15)
BUN: 21 mg/dL (ref 8–23)
CO2: 23 mmol/L (ref 22–32)
Calcium: 8.5 mg/dL — ABNORMAL LOW (ref 8.9–10.3)
Chloride: 108 mmol/L (ref 98–111)
Creatinine, Ser: 1.25 mg/dL — ABNORMAL HIGH (ref 0.44–1.00)
GFR calc Af Amer: 45 mL/min — ABNORMAL LOW (ref >60)
GFR calc non Af Amer: 39 mL/min — ABNORMAL LOW (ref >60)
Glucose, Bld: 96 mg/dL (ref 70–99)
Potassium: 4.6 mmol/L (ref 3.5–5.1)
Sodium: 138 mmol/L (ref 135–145)

## 2018-07-28 MED ORDER — ENOXAPARIN SODIUM 30 MG/0.3ML ~~LOC~~ SOLN
30.0000 mg | SUBCUTANEOUS | Status: DC
  Administered 2018-07-28: 30 mg via SUBCUTANEOUS
  Filled 2018-07-28: qty 0.3

## 2018-07-28 NOTE — Progress Notes (Signed)
  Date: 07/28/2018  Patient name: Jane [name]  Medical record number: [id number]  Date of birth: 06/02/1929   I have seen and evaluated this patient and I have discussed the plan of care with the house staff. Please see their note for complete details. I concur with their findings with the following additions/corrections:   No significant change since yesterday.  On exam, her cellulitis seems to be improving, although still tender to palpation.  She pointed out her toenails need trimming, and I agree, as they are curling around into her toes.  She continues to have variable responses when the plan for discharge to SNF is discussed.  At times, she is agreeable, at other times she is resistant.  Based on our discussions and clinical evaluation, I truly think it is not safe for her to go home and live by herself.  I am not sure she truly understands the dangers involved, but hopefully she will continue to allow [place] to proceed with the process of SNF placement.  Lenice Pressman, M.D., Ph.D. 07/28/2018, 11:27 AM

## 2018-07-28 NOTE — Plan of Care (Signed)
  Problem: Activity: Goal: Capacity to carry out activities will improve Outcome: Progressing   Problem: Health Behavior/Discharge Planning: Goal: Ability to manage health-related needs will improve Outcome: Progressing   Problem: Activity: Goal: Risk for activity intolerance will decrease Outcome: Progressing   

## 2018-07-28 NOTE — Progress Notes (Signed)
   Subjective: Patient said she is having lower abdomen and side pain that she has everyday. She is having pain in her left leg and in her lower right leg. She said she hasn't had a chance to talk to her nephew, so she is still unsure if she wants to go to SNF. She reports she forgets things often. She tries to do things on her own without asking her family for too much help.   Objective:  Vital signs in last 24 hours: Vitals:   07/27/18 1235 07/27/18 1911 07/28/18 0334 07/28/18 0337  BP: (!) 144/61 (!) 124/48  (!) 172/64  Pulse: 67 66  73  Resp: 18 18  18   Temp: 98.2 F (36.8 C) 97.8 F (36.6 C)  97.6 F (36.4 C)  TempSrc: Oral Oral  Oral  SpO2: 98% 95%  97%  Weight:   69 kg   Height:       Gen: seen comfortably in bed, no distress CV: RRR, no murmurs, no JVD Lungs: ctab, normal effort Extremities: No edema. Chronic skin discoloration on the left leg with overlying erythema to the mid-shin. Left leg is ttp. Right leg also has some chronic skin changes, but more faint than the left. No erythema or tenderness on the right.   Assessment/Plan:  Principal Problem:   Cellulitis of left lower extremity Active Problems:   PAD (peripheral artery disease) (HCC)   Hypokalemia   Cellulitis of leg, left   Cellulitis  Jane [name] is a89yoF with dCHF, CKD3, HTN, CAD, GERD, HLD presenting with shortness of breathand left leg redness, swelling, and pain. Left leg changes concerning for cellulitis.  Left leg cellulitis -Leg continues to clinically improve - Blood cx without growth x2 days - PT recommends SNF. Patient's family prefer SNF. Nursing staff also recommends SNF because the patient seems weak and confused Plan - PO keflex for nonpurulent cellulitis(day 4 of5) - Discuss discharge options with patient and family (SNF vs. Home with home health/PT)  Hypokalemia&Hypomagnesemia -Normal electrolytes today -Continue to monitor   Shortness of Breath - Dyspnea has  resolved - Feels her SOB is related to anxiety  -Continuealbuterol prn  CHF - Recent echo showed EF 55-60%, no wall motion abnls, g1dd, mild AR -Euvolemic on exam - Troponin 0.03, EKG unchanged compared to prior -Holding home lasix in the setting of increased Cr  Increased Cr - Cr 1.25 (BL 1). May be secondary to poor PO intake as the patient does not like the food or coffee in the hospital. Plan - Encourage PO intake  - Avoid nephrotoxic agents - Hold lasix  HLD: Continue home atorvastatin, aspirin  DPO:[id number]. Continue home losartan, imdur, and bisoprolol   Dispo: Anticipated discharge pending SNF discussion with family.  Dorrell, Andree Elk, MD 07/28/2018, 6:21 AM Pager: [phone]

## 2018-07-28 NOTE — Progress Notes (Signed)
Physical Therapy Treatment Patient Details Name: Jane [name] MRN: [id number] DOB: 06-18-1929 Today's Date: 07/28/2018    History of Present Illness 49yoF with dCHF, CKD3, HTN, CAD, GERD, HLD presenting with shortness of breath and left leg redness, swelling, and pain. Sob resolved. Left leg changes concerning for cellulitis.    PT Comments    Patient progressing well towards PT goals. Improved ambulation distance with Min A for balance/safety. Limited mainly by LE pain especially with weight bearing. Requires assist to stand from EOB due to bil knee instability and pain. Pt lives alone and concerned about her caring for herself right now. Would really benefit from SNF. Not sure if she wants to go to rehab or home. Will follow.    Follow Up Recommendations  SNF;Supervision/Assistance - 24 hour     Equipment Recommendations  None recommended by PT    Recommendations for Other Services       Precautions / Restrictions Precautions Precautions: Fall Restrictions Weight Bearing Restrictions: No    Mobility  Bed Mobility Overal bed mobility: Needs Assistance Bed Mobility: Rolling;Sidelying to Sit Rolling: Supervision Sidelying to sit: Min guard;HOB elevated       General bed mobility comments: Increased time and effort to get to EOB.   Transfers Overall transfer level: Needs assistance Equipment used: Rolling walker (2 wheeled) Transfers: Sit to/from Stand Sit to Stand: Min assist         General transfer comment: min assist to power up to standing due to bilateral LE pain L> R  Ambulation/Gait Ambulation/Gait assistance: Min assist Gait Distance (Feet): 32 Feet Assistive device: Rolling walker (2 wheeled) Gait Pattern/deviations: Antalgic;Trunk flexed;Step-through pattern Gait velocity: decreased   General Gait Details: Slow, unsteady gait; bil knee instability noted but no buckling. Limited by pain in BLEs. Increased WB through [place].    Stairs             Wheelchair Mobility    Modified Rankin (Stroke Patients Only)       Balance Overall balance assessment: Needs assistance Sitting-balance support: Feet supported;No upper extremity supported Sitting balance-Leahy Scale: Fair     Standing balance support: During functional activity;Bilateral upper extremity supported Standing balance-Leahy Scale: Poor Standing balance comment: Reliant on BUEs for support in standing.                             Cognition Arousal/Alertness: Awake/alert Behavior During Therapy: WFL for tasks assessed/performed Overall Cognitive Status: Impaired/Different from baseline Area of Impairment: Memory;Following commands;Safety/judgement                     Memory: Decreased short-term memory Following Commands: Follows one step commands with increased time Safety/Judgement: Decreased awareness of safety;Decreased awareness of deficits   Problem Solving: Slow processing;Decreased initiation;Difficulty sequencing;Requires verbal cues;Requires tactile cues General Comments: Tangential with speech. Perseverating about neighbors not being great and wanting money.       Exercises      General Comments        Pertinent Vitals/Pain Pain Assessment: 0-10 Pain Score: 8  Pain Location: BLE Pain Descriptors / Indicators: Sore;Aching Pain Intervention(s): Monitored during session;Limited activity within patient's tolerance    Home Living                      Prior Function            PT Goals (current goals can now be found in the care  plan section) Progress towards PT goals: Progressing toward goals    Frequency    Min 3X/week      PT Plan Current plan remains appropriate    Co-evaluation              AM-PAC PT "6 Clicks" Mobility   Outcome Measure  Help needed turning from your back to your side while in a flat bed without using bedrails?: A Little Help needed moving from lying on your back to  sitting on the side of a flat bed without using bedrails?: A Little Help needed moving to and from a bed to a chair (including a wheelchair)?: A Little Help needed standing up from a chair using your arms (e.g., wheelchair or bedside chair)?: A Little Help needed to walk in hospital room?: A Little Help needed climbing 3-5 steps with a railing? : Total 6 Click Score: 16    End of Session Equipment Utilized During Treatment: Gait belt Activity Tolerance: Patient tolerated treatment well;Patient limited by pain Patient left: in bed;with call bell/phone within reach;with bed alarm set(sitting EOB eating breakfast) Nurse Communication: Mobility status PT Visit Diagnosis: Pain;Difficulty in walking, not elsewhere classified (R26.2) Pain - Right/Left: (bil)     Time: 4196-2229 PT Time Calculation (min) (ACUTE ONLY): 25 min  Charges:  $Gait Training: 23-37 mins                     Wray Kearns, PT, DPT Acute Rehabilitation Services Pager [phone] Office [place] 07/28/2018, 10:17 AM

## 2018-07-28 NOTE — Clinical Social Work Note (Addendum)
Patient still undecided about SNF but appears to be leaning more towards SNF. She does say [facility] only [facility] she will go to. If she cannot go there, she definitely wants to go home. No response from facility on hub yet. Left message for admissions coordinator asking her to review. Patient gave CSW permission to call her nephew [name]. He is agreeable to rehab at [facility] and hopeful that patient will agree. He will be here around 1:30/2:00 today. He said if patient goes to SNF, she will have to go by [facility].  Dayton Scrape, [place] [phone]  12:35 pm [facility] is able to extend a bed offer. Patient is aware and agreeable. Plan for discharge tomorrow once she has been here 3 midnights under inpatient status. MD aware.  Dayton Scrape, [place]

## 2018-07-29 DIAGNOSIS — R2689 Other abnormalities of gait and mobility: Secondary | ICD-10-CM | POA: Diagnosis not present

## 2018-07-29 DIAGNOSIS — I739 Peripheral vascular disease, unspecified: Secondary | ICD-10-CM | POA: Diagnosis not present

## 2018-07-29 DIAGNOSIS — L03116 Cellulitis of left lower limb: Secondary | ICD-10-CM | POA: Diagnosis not present

## 2018-07-29 DIAGNOSIS — R7989 Other specified abnormal findings of blood chemistry: Secondary | ICD-10-CM | POA: Diagnosis not present

## 2018-07-29 DIAGNOSIS — R1312 Dysphagia, oropharyngeal phase: Secondary | ICD-10-CM | POA: Diagnosis not present

## 2018-07-29 DIAGNOSIS — M79674 Pain in right toe(s): Secondary | ICD-10-CM | POA: Diagnosis not present

## 2018-07-29 DIAGNOSIS — I130 Hypertensive heart and chronic kidney disease with heart failure and stage 1 through stage 4 chronic kidney disease, or unspecified chronic kidney disease: Secondary | ICD-10-CM | POA: Diagnosis not present

## 2018-07-29 DIAGNOSIS — B351 Tinea unguium: Secondary | ICD-10-CM | POA: Diagnosis not present

## 2018-07-29 DIAGNOSIS — L905 Scar conditions and fibrosis of skin: Secondary | ICD-10-CM

## 2018-07-29 DIAGNOSIS — L0390 Cellulitis, unspecified: Secondary | ICD-10-CM | POA: Diagnosis not present

## 2018-07-29 DIAGNOSIS — I5032 Chronic diastolic (congestive) heart failure: Secondary | ICD-10-CM | POA: Diagnosis not present

## 2018-07-29 DIAGNOSIS — I872 Venous insufficiency (chronic) (peripheral): Secondary | ICD-10-CM | POA: Diagnosis not present

## 2018-07-29 DIAGNOSIS — I351 Nonrheumatic aortic (valve) insufficiency: Secondary | ICD-10-CM | POA: Diagnosis not present

## 2018-07-29 DIAGNOSIS — I10 Essential (primary) hypertension: Secondary | ICD-10-CM | POA: Diagnosis not present

## 2018-07-29 DIAGNOSIS — R239 Unspecified skin changes: Secondary | ICD-10-CM | POA: Diagnosis not present

## 2018-07-29 DIAGNOSIS — E876 Hypokalemia: Secondary | ICD-10-CM | POA: Diagnosis not present

## 2018-07-29 DIAGNOSIS — Z7401 Bed confinement status: Secondary | ICD-10-CM | POA: Diagnosis not present

## 2018-07-29 DIAGNOSIS — N183 Chronic kidney disease, stage 3 (moderate): Secondary | ICD-10-CM | POA: Diagnosis not present

## 2018-07-29 DIAGNOSIS — N182 Chronic kidney disease, stage 2 (mild): Secondary | ICD-10-CM | POA: Diagnosis not present

## 2018-07-29 DIAGNOSIS — I2510 Atherosclerotic heart disease of native coronary artery without angina pectoris: Secondary | ICD-10-CM | POA: Diagnosis not present

## 2018-07-29 DIAGNOSIS — E7849 Other hyperlipidemia: Secondary | ICD-10-CM | POA: Diagnosis not present

## 2018-07-29 DIAGNOSIS — E785 Hyperlipidemia, unspecified: Secondary | ICD-10-CM | POA: Diagnosis not present

## 2018-07-29 DIAGNOSIS — I5030 Unspecified diastolic (congestive) heart failure: Secondary | ICD-10-CM | POA: Diagnosis not present

## 2018-07-29 DIAGNOSIS — R0600 Dyspnea, unspecified: Secondary | ICD-10-CM

## 2018-07-29 DIAGNOSIS — Z8673 Personal history of transient ischemic attack (TIA), and cerebral infarction without residual deficits: Secondary | ICD-10-CM | POA: Diagnosis not present

## 2018-07-29 DIAGNOSIS — K5901 Slow transit constipation: Secondary | ICD-10-CM | POA: Diagnosis not present

## 2018-07-29 DIAGNOSIS — R5381 Other malaise: Secondary | ICD-10-CM | POA: Diagnosis not present

## 2018-07-29 DIAGNOSIS — R2681 Unsteadiness on feet: Secondary | ICD-10-CM | POA: Diagnosis not present

## 2018-07-29 DIAGNOSIS — M6281 Muscle weakness (generalized): Secondary | ICD-10-CM | POA: Diagnosis not present

## 2018-07-29 DIAGNOSIS — M2550 Pain in unspecified joint: Secondary | ICD-10-CM | POA: Diagnosis not present

## 2018-07-29 DIAGNOSIS — M79675 Pain in left toe(s): Secondary | ICD-10-CM | POA: Diagnosis not present

## 2018-07-29 DIAGNOSIS — M1990 Unspecified osteoarthritis, unspecified site: Secondary | ICD-10-CM | POA: Diagnosis not present

## 2018-07-29 DIAGNOSIS — D61818 Other pancytopenia: Secondary | ICD-10-CM | POA: Diagnosis not present

## 2018-07-29 DIAGNOSIS — R41841 Cognitive communication deficit: Secondary | ICD-10-CM | POA: Diagnosis not present

## 2018-07-29 DIAGNOSIS — J449 Chronic obstructive pulmonary disease, unspecified: Secondary | ICD-10-CM | POA: Diagnosis not present

## 2018-07-29 MED ORDER — LIDOCAINE 5 % EX PTCH
1.0000 | MEDICATED_PATCH | CUTANEOUS | Status: DC
  Administered 2018-07-29: 1 via TRANSDERMAL
  Filled 2018-07-29: qty 1

## 2018-07-29 MED ORDER — KETOROLAC TROMETHAMINE 15 MG/ML IJ SOLN
7.5000 mg | Freq: Once | INTRAMUSCULAR | Status: AC
  Administered 2018-07-29: 7.5 mg via INTRAVENOUS
  Filled 2018-07-29: qty 1

## 2018-07-29 MED ORDER — CEPHALEXIN 500 MG PO CAPS: 500.0000 mg | ORAL_CAPSULE | Freq: Two times a day (BID) | ORAL | 0 refills | Status: DC

## 2018-07-29 NOTE — Clinical Social Work Placement (Signed)
   CLINICAL SOCIAL WORK PLACEMENT  NOTE  Date:  07/29/2018  Patient Details  Name: Jane [name] MRN: [id number] Date of Birth: [birth year]  Clinical Social Work is seeking post-discharge placement for this patient at the [place] level of care (*CSW will initial, date and re-position this form in  chart as items are completed):      Patient/family provided with [facility] Work Department's list of facilities offering this level of care within the geographic area requested by the patient (or if unable, by the patient's family).      Patient/family informed of their freedom to choose among providers that offer the needed level of care, that participate in Medicare, Medicaid or managed care program needed by the patient, have an available bed and are willing to accept the patient.      Patient/family informed of [facility]'s ownership interest in [facility] and [facility], as well as of the fact that they are under no obligation to receive care at these facilities.  PASRR submitted to EDS on 07/27/18     PASRR number received on       Existing PASRR number confirmed on 07/27/18     FL2 transmitted to all facilities in geographic area requested by pt/family on 07/27/18     FL2 transmitted to all facilities within larger geographic area on       Patient informed that his/her managed care company has contracts with or will negotiate with certain facilities, including the following:        Yes   Patient/family informed of bed offers received.  Patient chooses bed at [facility] recommends and patient chooses bed at      Patient to be transferred to [facility] on 07/29/18.  Patient to be transferred to facility by PTAR     Patient family notified on 07/29/18 of transfer.  Name of family member notified:  [name]     PHYSICIAN Please prepare prescriptions     Additional Comment:     _______________________________________________ Candie Chroman, LCSW 07/29/2018, 12:04 PM

## 2018-07-29 NOTE — Clinical Social Work Note (Signed)
CSW facilitated patient discharge including contacting patient family and facility to confirm patient discharge plans. Clinical information faxed to facility and family agreeable with plan. CSW arranged ambulance transport via [facility] to [place]. RN to call report prior to discharge [phone] Room [phone]).  CSW will sign off for now as social work intervention is no longer needed. Please consult [place] again if new needs arise.  Dayton Scrape, [place]

## 2018-07-29 NOTE — Progress Notes (Signed)
   Subjective: No overnight events.Patient reports she is feeling okay this morning. She is continuing to have pain In her left lower extremity, on the sides. She states her toenails have not been clipped for a while. All questions and concerns addressed.  Objective:  Vital signs in last 24 hours: Vitals:   07/28/18 0828 07/28/18 1204 07/28/18 1920 07/29/18 0453  BP: 136/74 (!) 145/59 (!) 137/58 (!) 165/71  Pulse: 76 (!) 55 64 74  Resp:  20 18 18   Temp:  97.8 F (36.6 C) 98.5 F (36.9 C) 98.1 F (36.7 C)  TempSrc:  Oral Oral Oral  SpO2:  99% 98% 97%  Weight:      Height:       Gen: seen comfortably in bed, no distress CV: RRR, no murmurs Extremities: No edema. Patient continues to have ttp in bilateral lower extremities. The erythema on the left lower leg is stable.  Assessment/Plan:  Principal Problem:   Cellulitis of left lower extremity Active Problems:   PAD (peripheral artery disease) (HCC)   Hypokalemia   Cellulitis of leg, left   Cellulitis  Jane [name] is a89yoF with dCHF, CKD3, HTN, CAD, GERD, HLD presenting with shortness of breathand left leg redness, swelling, and pain. Left leg changes concerning for cellulitis.  Left leg cellulitis -Leg continues to clinically improve, however given persistence of erythema and tenderness, will extend abx course to 10 days - Blood cx without growth. Plan - PO keflex for nonpurulent cellulitis - Discharge to SNF today  Hypokalemia&Hypomagnesemia -Resolved  Shortness of Breath - Dyspnea has resolved -Continuealbuterol prn  CHF - Recent echo showed EF 55-60%, no wall motion abnls, g1dd, mild AR -Euvolemic on exam - Troponin 0.03, EKG unchanged compared to prior -Resume home lasix on discharge  Increased Cr - Cr 1.25 yesterday (BL 1). May be secondary to poor PO intake as the patient does not like the food or coffee in the hospital. Plan - Encouraged PO intake  - Avoid nephrotoxic agents -  Will need kidney function reassessment on f/u  HLD: Continue home atorvastatin, aspirin  EFE:[id number]. Continue home losartan, imdur, and bisoprolol   Dispo: Anticipated discharge to SNF today.  , Andree Elk, MD 07/29/2018, 6:21 AM Pager: [phone]

## 2018-07-29 NOTE — Plan of Care (Signed)
  Problem: Activity: Goal: Capacity to carry out activities will improve Outcome: Progressing   Problem: Cardiac: Goal: Ability to achieve and maintain adequate cardiopulmonary perfusion will improve Outcome: Progressing   

## 2018-07-29 NOTE — Progress Notes (Signed)
Patient report given to nurse Dorothyann Gibbs at [facility].  Patient ready for [facility] to transport. Marcille Blanco, RN

## 2018-07-30 ENCOUNTER — Encounter: Payer: Self-pay | Admitting: Internal Medicine

## 2018-07-30 ENCOUNTER — Non-Acute Institutional Stay (SKILLED_NURSING_FACILITY): Payer: Medicare Other | Admitting: Internal Medicine

## 2018-07-30 DIAGNOSIS — I10 Essential (primary) hypertension: Secondary | ICD-10-CM

## 2018-07-30 DIAGNOSIS — D61818 Other pancytopenia: Secondary | ICD-10-CM

## 2018-07-30 DIAGNOSIS — N182 Chronic kidney disease, stage 2 (mild): Secondary | ICD-10-CM | POA: Diagnosis not present

## 2018-07-30 DIAGNOSIS — L03116 Cellulitis of left lower limb: Secondary | ICD-10-CM | POA: Diagnosis not present

## 2018-07-30 LAB — CULTURE, BLOOD (ROUTINE X 2)
Culture: NO GROWTH
Culture: NO GROWTH
Special Requests: ADEQUATE

## 2018-07-30 NOTE — Assessment & Plan Note (Signed)
Creatinine 1.25 at discharge Increase bisoprolol to 5 mg daily; recheck BMET and magnesium 08/03/2018

## 2018-07-30 NOTE — Progress Notes (Signed)
NURSING HOME LOCATION:  [facility] ROOM NUMBER:  [id number]  CODE STATUS:  Full Code  PCP:  Ina Homes, MD  [place] [id number]  This is a comprehensive admission note to [facility] performed on this date less than 30 days from date of admission.  Included are preadmission medical/surgical history; reconciled medication list; family history; social history and comprehensive review of systems.  Corrections and additions to the records were documented. Comprehensive physical exam was also performed. Additionally a clinical summary was entered for each active diagnosis pertinent to this admission in the Problem List to enhance continuity of care.  HPI: The patient was hospitalized 1/25-1/29/2020 with left leg cellulitis.  This was complicated by hypokalemia and hypomagnesemia which required supplementation.  This was felt to be due to Lasix diuresis. She also exhibited dyspnea which was attributed to anxiety.  She has history of congestive heart failure but clinically she was euvolemic on admission.  Troponin was negative.  EKG was normal. At discharge low-dose Lasix 20 mg daily was reinitiated.  If hypokalemia recurred or persisted, consideration was to be given to starting spironolactone. A caveat is that creatinine at discharge was 1.25, baseline is presumed to be 1.0.  She also exhibited pancytopenia with a white count of 3800, hemoglobin 9.5, hematocrit 28.9, and platelet count of 114,000.  Patient was discharged on Keflex to complete 10 total days of antibiotics, last day was to be 2/2.  She had received 1 dose of vancomycin before the transition to oral medicines.  Past medical and surgical history: essential hypertension, history of stroke, vitamin D deficiency, GERD, colon polyp, CKD, aortic insufficiency and dyslipidemia.   Surgeries include coronary artery stenting and hysterectomy.  Social history: Nondrinker: Former smoker, quitting in [place]  history: Reviewed, noncontributory due to age   Review of systems: She could not give me the day of the month but knew it was January 2020.  She states that she has intermittent low back pain.  Her legs continue to burn intermittently as well.  She said that on occasion she will have some lower abdominal pain.  She also validates occasional constipation.  She has "eye problems" but she cannot provide a diagnosis.  Constitutional: No fever, significant weight change, fatigue  ENT/mouth: No nasal congestion, purulent discharge, earache, change in hearing, sore throat  Cardiovascular: No chest pain, palpitations, paroxysmal nocturnal dyspnea, claudication, edema  Respiratory: No cough, sputum production, hemoptysis, DOE, significant snoring, apnea Gastrointestinal: No heartburn, dysphagia, nausea /vomiting, rectal bleeding, melena Genitourinary: No dysuria, hematuria, pyuria, incontinence, nocturia Dermatologic: No rash, pruritus, change in appearance of skin beyond that related to the cellulitis for which she was hospitalized Neurologic: No dizziness, headache, syncope, seizures, numbness, tingling Psychiatric: No significant anxiety, depression, insomnia, anorexia Endocrine: No change in hair/skin/nails, excessive thirst, excessive hunger, excessive urination  Hematologic/lymphatic: No significant bruising, lymphadenopathy, abnormal bleeding Allergy/immunology: No itchy/watery eyes, significant sneezing, urticaria, angioedema  Physical exam:  Pertinent or positive findings: She is hard of hearing.  Eyebrows are essentially absent.  She exhibits bilateral proptosis.  She has alternating exotropia, this is more often on the left.  Ectropion of the left lower lid is present.  There is mild erythema of the mucosal surface of the left lower lid without frank purulence or cellulitis.  She is edentulous.  Pedal pulses are decreased.  She has irregular hyperpigmented sclerotic dermatitis of the left shin  without signs of active cellulitis.  There is a DIP contracture  of the right index finger.  Her legs are weak to opposition.  General appearance: Adequately nourished; no acute distress, increased work of breathing is present.   Lymphatic: No lymphadenopathy about the head, neck, axilla. Eyes: No scleral icterus. Ears:  External ear exam shows no significant lesions or deformities.   Nose:  External nasal examination shows no deformity or inflammation. Nasal mucosa are pink and moist without lesions, exudates Oral exam: Lips and gums are healthy appearing. Neck:  No thyromegaly, masses, tenderness noted.    Heart:  Normal rate and regular rhythm. S1 and S2 normal without gallop, murmur, click, rub.  Lungs: Chest clear to auscultation without wheezes, rhonchi, rales, rubs. Abdomen: Bowel sounds are normal.  Abdomen is soft and nontender with no organomegaly, hernias, masses. GU: Deferred  Extremities:  No cyanosis, clubbing, edema. Neurologic exam:  Strength equal  in upper extremities. Balance, Rhomberg, finger to nose testing could not be completed due to clinical state Skin: Warm & dry w/o tenting.  See clinical summary under each active problem in the Problem List with associated updated therapeutic plan

## 2018-07-30 NOTE — Assessment & Plan Note (Addendum)
Serial systolic blood pressures over the last several days have ranged from a low systolic of 015 up to high 165.  Diastolic blood pressures have ranged from 58-92. Increase bisoprolol to 5 mg daily

## 2018-07-30 NOTE — Patient Instructions (Signed)
See assessment and plan under each diagnosis in the problem list and acutely for this visit 

## 2018-07-31 ENCOUNTER — Encounter: Payer: Self-pay | Admitting: Internal Medicine

## 2018-07-31 NOTE — Assessment & Plan Note (Signed)
Complete Keflex

## 2018-07-31 NOTE — Assessment & Plan Note (Signed)
CBC & dif 2/3

## 2018-08-03 LAB — BASIC METABOLIC PANEL
BUN: 28 — AB (ref 4–21)
Creatinine: 0.9 (ref 0.5–1.1)
Glucose: 87
Potassium: 4.3 (ref 3.4–5.3)
Sodium: 143 (ref 137–147)

## 2018-08-03 LAB — CBC AND DIFFERENTIAL
HCT: 29 — AB (ref 36–46)
Hemoglobin: 10.4 — AB (ref 12.0–16.0)
Neutrophils Absolute: 2
Platelets: 141 — AB (ref 150–399)
WBC: 3.3

## 2018-08-04 LAB — CBC AND DIFFERENTIAL
HEMATOCRIT: 29 — AB (ref 36–46)
Hemoglobin: 10 — AB (ref 12.0–16.0)
Neutrophils Absolute: 2
Platelets: 148 — AB (ref 150–399)
WBC: 3.2

## 2018-08-04 LAB — BASIC METABOLIC PANEL
BUN: 30 — AB (ref 4–21)
Creatinine: 0.9 (ref 0.5–1.1)
GLUCOSE: 90
Potassium: 4.5 (ref 3.4–5.3)
Sodium: 144 (ref 137–147)

## 2018-08-13 ENCOUNTER — Encounter: Payer: Self-pay | Admitting: Adult Health

## 2018-08-13 ENCOUNTER — Non-Acute Institutional Stay (SKILLED_NURSING_FACILITY): Payer: Medicare Other | Admitting: Adult Health

## 2018-08-13 DIAGNOSIS — K5901 Slow transit constipation: Secondary | ICD-10-CM | POA: Diagnosis not present

## 2018-08-13 DIAGNOSIS — I2510 Atherosclerotic heart disease of native coronary artery without angina pectoris: Secondary | ICD-10-CM | POA: Diagnosis not present

## 2018-08-13 DIAGNOSIS — I5032 Chronic diastolic (congestive) heart failure: Secondary | ICD-10-CM | POA: Diagnosis not present

## 2018-08-13 DIAGNOSIS — I10 Essential (primary) hypertension: Secondary | ICD-10-CM | POA: Diagnosis not present

## 2018-08-13 DIAGNOSIS — E7849 Other hyperlipidemia: Secondary | ICD-10-CM

## 2018-08-13 NOTE — Progress Notes (Signed)
Location:  [place] Room Number: 235 Place of Service:  SNF ([id number]) Provider:  Durenda Age, NP  Patient Care Team: Ina Homes, MD as PCP - General (Internal Medicine)  Extended Emergency Contact Information Primary Emergency Contact: McAdoo,Michael Address: [address]          [place] [address] [place] Phone: [phone] Mobile Phone: [phone] Relation: Son Secondary Emergency Contact: [name] Mobile Phone: [phone] Relation: Nephew  Code Status:  Full Code  Goals of care: Advanced Directive information Advanced Directives 07/25/2018  Does Patient Have a Medical Advance Directive? No  Type of Advance Directive -  Does patient want to make changes to medical advance directive? -  Copy of [place] in Chart? -  Would patient like information on creating a medical advance directive? No - Patient declined  Pre-existing out of facility DNR order (yellow form or pink MOST form) -     Chief Complaint  Patient presents with  . Medical Management of Chronic Issues    Patient is seen for a routine rehabilitation [facility] visit    HPI:  Pt is an 85 y.o. female seen today for medical management of chronic diseases while undergoing rehabilitation at [facility].  She has a PMH of stroke, dyslipidemia, GERD, colon polyps, anemia, and aortic insufficiency. She was seen in her room today. She verbalized being constipated.   She has been admitted to [place] on 07/29/18 from a recent hospitalization for left leg cellulitis. She has completed her antibiotic treatments. Left leg has no open wound nor erythema. Cellulitis has resolved.   Past Medical History:  Diagnosis Date  . Anemia   . Aortic insufficiency    mild  . Arthritis    "arms" (08/09/2015)  . CAD (coronary artery disease)    cath 08/09/2014 95% stenosis in prox to mid RCA s/p  DES, 80-90% prox OM2, 50% distal LAD  . CHF (congestive heart failure) ([place])   . CKD (chronic kidney disease) stage 3, GFR 30-59 ml/min (HCC)   . Colon polyp    2009 colonoscopy, not retrieved for pathology  . Dyspnea   . Ectropion of left lower eyelid   . GERD (gastroesophageal reflux disease) 2009   EGD with benign gastric polyp too  . Hyperlipidemia   . Hypertension   . Pneumonia    history of  . Stroke ([place]) 1975  . Vitamin D deficiency    Past Surgical History:  Procedure Laterality Date  . ABDOMINAL HYSTERECTOMY    . APPENDECTOMY    . ARTERY BIOPSY Left 12/16/2012   Procedure: BIOPSY TEMPORAL ARTERY;  Surgeon: Mal Misty, MD;  Location: [facility];  Service: Vascular;  Laterality: Left;  . CARDIAC CATHETERIZATION    . LEFT HEART CATHETERIZATION WITH CORONARY ANGIOGRAM N/A 08/09/2014   Procedure: LEFT HEART CATHETERIZATION WITH CORONARY ANGIOGRAM;  Surgeon: Peter M Martinique, MD;  Location: [facility] CATH LAB;  Service: Cardiovascular;  Laterality: N/A;  . PERCUTANEOUS CORONARY STENT INTERVENTION (PCI-S)  08/09/2014   Procedure: PERCUTANEOUS CORONARY STENT INTERVENTION (PCI-S);  Surgeon: Peter M Martinique, MD;  Location: [facility] CATH LAB;  Service: Cardiovascular;;  . TONSILLECTOMY      No Known Allergies  Outpatient Encounter Medications as of 08/13/2018  Medication Sig  . acetaminophen (TYLENOL) 325 MG tablet Take 650 mg by mouth every 6 (six) hours as needed for mild pain, moderate pain or headache.   . albuterol (PROVENTIL HFA;VENTOLIN HFA)  108 (90 Base) MCG/ACT inhaler Inhale 1-2 puffs into the lungs every 6 (six) hours as needed for wheezing or shortness of breath.  Marland Kitchen aspirin EC 81 MG tablet Take 1 tablet (81 mg total) by mouth daily.  Marland Kitchen atorvastatin (LIPITOR) 40 MG tablet Take 1 tablet (40 mg total) by mouth daily at 6 PM.  . bisoprolol (ZEBETA) 5 MG tablet Take 0.5 tablets (2.5 mg total) by mouth daily.  . furosemide (LASIX) 20 MG tablet Take 1 tablet (20 mg total) by mouth daily.  .  isosorbide mononitrate (IMDUR) 30 MG 24 hr tablet Take 1 tablet (30 mg total) by mouth daily.  Marland Kitchen losartan (COZAAR) 100 MG tablet Take 1 tablet (100 mg total) by mouth daily.   No facility-administered encounter medications on file as of 08/13/2018.     Review of Systems  GENERAL: No change in appetite, no fatigue, no weight changes, no fever, chills or weakness MOUTH and THROAT: Denies oral discomfort, gingival pain or bleeding RESPIRATORY: no cough, SOB, DOE, wheezing, hemoptysis CARDIAC: No chest pain, edema or palpitations GI: +constipation GU: Denies dysuria, frequency, hematuria, incontinence, or discharge NEUROLOGICAL: Denies dizziness, syncope, numbness, or headache PSYCHIATRIC: Denies feelings of depression or anxiety. No report of hallucinations, insomnia, paranoia, or agitation   Immunization History  Administered Date(s) Administered  . Influenza Whole 03/28/2008  . Influenza,inj,Quad PF,6+ Mos 08/09/2013, 03/30/2015, 03/24/2017  . Pneumococcal Conjugate-13 12/08/2014  . Pneumococcal Polysaccharide-23 09/18/2012  . Tdap 02/01/2016, 06/21/2018   Pertinent  Health Maintenance Due  Topic Date Due  . INFLUENZA VACCINE  01/29/2018  . DEXA SCAN  Completed  . PNA vac Low Risk Adult  Completed   Fall Risk  03/19/2018 10/16/2017 09/26/2017 07/14/2017 04/21/2017  Falls in the past year? No Yes No No No  Number falls in past yr: - 1 - - -  Injury with Fall? - Yes - - -  Risk Factor Category  - High Fall Risk - - -  Risk for fall due to : - Impaired mobility;Impaired balance/gait Impaired balance/gait;Impaired mobility - -  Follow up - Falls prevention discussed - - -  Comment - - - - -     Vitals:   08/13/18 1052  BP: (!) 108/59  Pulse: 78  Resp: 16  Temp: (!) 97.1 F (36.2 C)  TempSrc: Oral  Weight: 148 lb 12.8 oz (67.5 kg)  Height: 5' (1.524 m)   Body mass index is 29.06 kg/m.  Physical Exam  GENERAL APPEARANCE: Well nourished. In no acute distress. Normal body  habitus SKIN:  Skin is warm and dry.  MOUTH and THROAT: Lips are without lesions. Oral mucosa is moist and without lesions. Tongue is normal in shape, size, and color and without lesions RESPIRATORY: Breathing is even & unlabored, BS CTAB CARDIAC: RRR, no murmur,no extra heart sounds, no edema GI: Abdomen soft, normal BS, no masses, no tenderness EXTREMITIES:  Able to move X 4 extremities NEUROLOGICAL: There is no tremor. Speech is clear. Alert to self, disoriented to time and place PSYCHIATRIC:  Affect and behavior are appropriate.    Labs reviewed: Recent Labs    07/25/18 1314 07/25/18 1939 07/26/18 0431 07/27/18 0410 07/28/18 0357 08/03/18 08/04/18  NA 139  --  139 137 138 143 144  K 4.1  --  3.2* 4.1 4.6 4.3 4.5  CL 109  --  109 107 108  --   --   CO2 20*  --  22 22 23   --   --  GLUCOSE 116*  --  98 92 96  --   --   BUN 15  --  14 21 21  28* 30*  CREATININE 0.97  --  1.09* 1.14* 1.25* 0.9 0.9  CALCIUM 8.0*  --  8.2* 8.2* 8.5*  --   --   MG 1.3* 2.9* 2.4  --   --   --   --    Recent Labs    07/26/18 0431  AST 20  ALT 11  ALKPHOS 65  BILITOT 1.3*  PROT 5.7*  ALBUMIN 3.0*   Recent Labs    07/01/18 1852  07/25/18 0333 07/26/18 0431 08/03/18 08/04/18  WBC 3.4*  --  3.1* 3.8* 3.3 3.2  NEUTROABS 1.7  --  1.6*  --  2 2  HGB 10.5*   < > 9.7* 9.5* 10.4* 10.0*  HCT 32.1*   < > 29.7* 28.9* 29* 29*  MCV 97.3  --  96.7 95.1  --   --   PLT 124*  --  124* 114* 141* 148*   < > = values in this interval not displayed.   Lab Results  Component Value Date   TSH 0.923 05/31/2014   Lab Results  Component Value Date   HGBA1C 5.7 (H) 06/01/2014   Lab Results  Component Value Date   CHOL 140 12/31/2016   HDL 61 12/31/2016   LDLCALC 68 12/31/2016   TRIG 57 12/31/2016   CHOLHDL 2.3 12/31/2016    Significant Diagnostic Results in last 30 days:  Dg Chest 2 View  Result Date: 07/25/2018 CLINICAL DATA:  Cough and shortness of breath EXAM: CHEST - 2 VIEW COMPARISON:   09/11/2017 FINDINGS: There is moderate cardiomegaly. No focal airspace consolidation or pulmonary edema. No pleural effusion or pneumothorax. IMPRESSION: Moderate cardiomegaly without focal airspace disease. Electronically Signed   By: Ulyses Jarred M.D.   On: 07/25/2018 04:38    Assessment/Plan  1. Chronic heart failure with preserved ejection fraction (HCC) - stable, continue Lasix 20 mg 1 tab daily, Isosorbide Mononitrate ER 30 mg 1 tab daily, Cozaar 100 mg 1 tab daily, Bisoprolol 5 mg 1 tab daily  2. Essential hypertension - well-controlled, continue Cozaar 100 mg 1 tab daily, Bisoprolol 5 mg 1 tab daily  3. Coronary artery disease involving native heart without angina pectoris, unspecified vessel or lesion type - no complaints of chest pains, continue Isosorbide Mononitrate ER 30 mg 1 tab daily,Lipitor 40 mg 1 tab daily   4. Other hyperlipidemia -  Continue Lipitor 40 mg 1 tab daily   5. Slow transit constipation - will start Senna-S 8.6-50 mg 2 tabs Q HS    Family/ staff Communication: Discussed plan of care with resident.  Labs/tests ordered:  None  Goals of care:   Short-term rehabilitation.   Durenda Age, NP [facility] [phone] (Monday-Friday 8:00 a.m. - 5:00 p.m.) [phone] (after hours)

## 2018-08-16 IMAGING — DX DG CHEST 2V
2 series · 2 of 2 positions shown · non-contrast
Comparison: 05/30/2016, 08/09/2015

CLINICAL DATA: Shortness of breath

EXAM:
CHEST  2 VIEW

[chest pa]
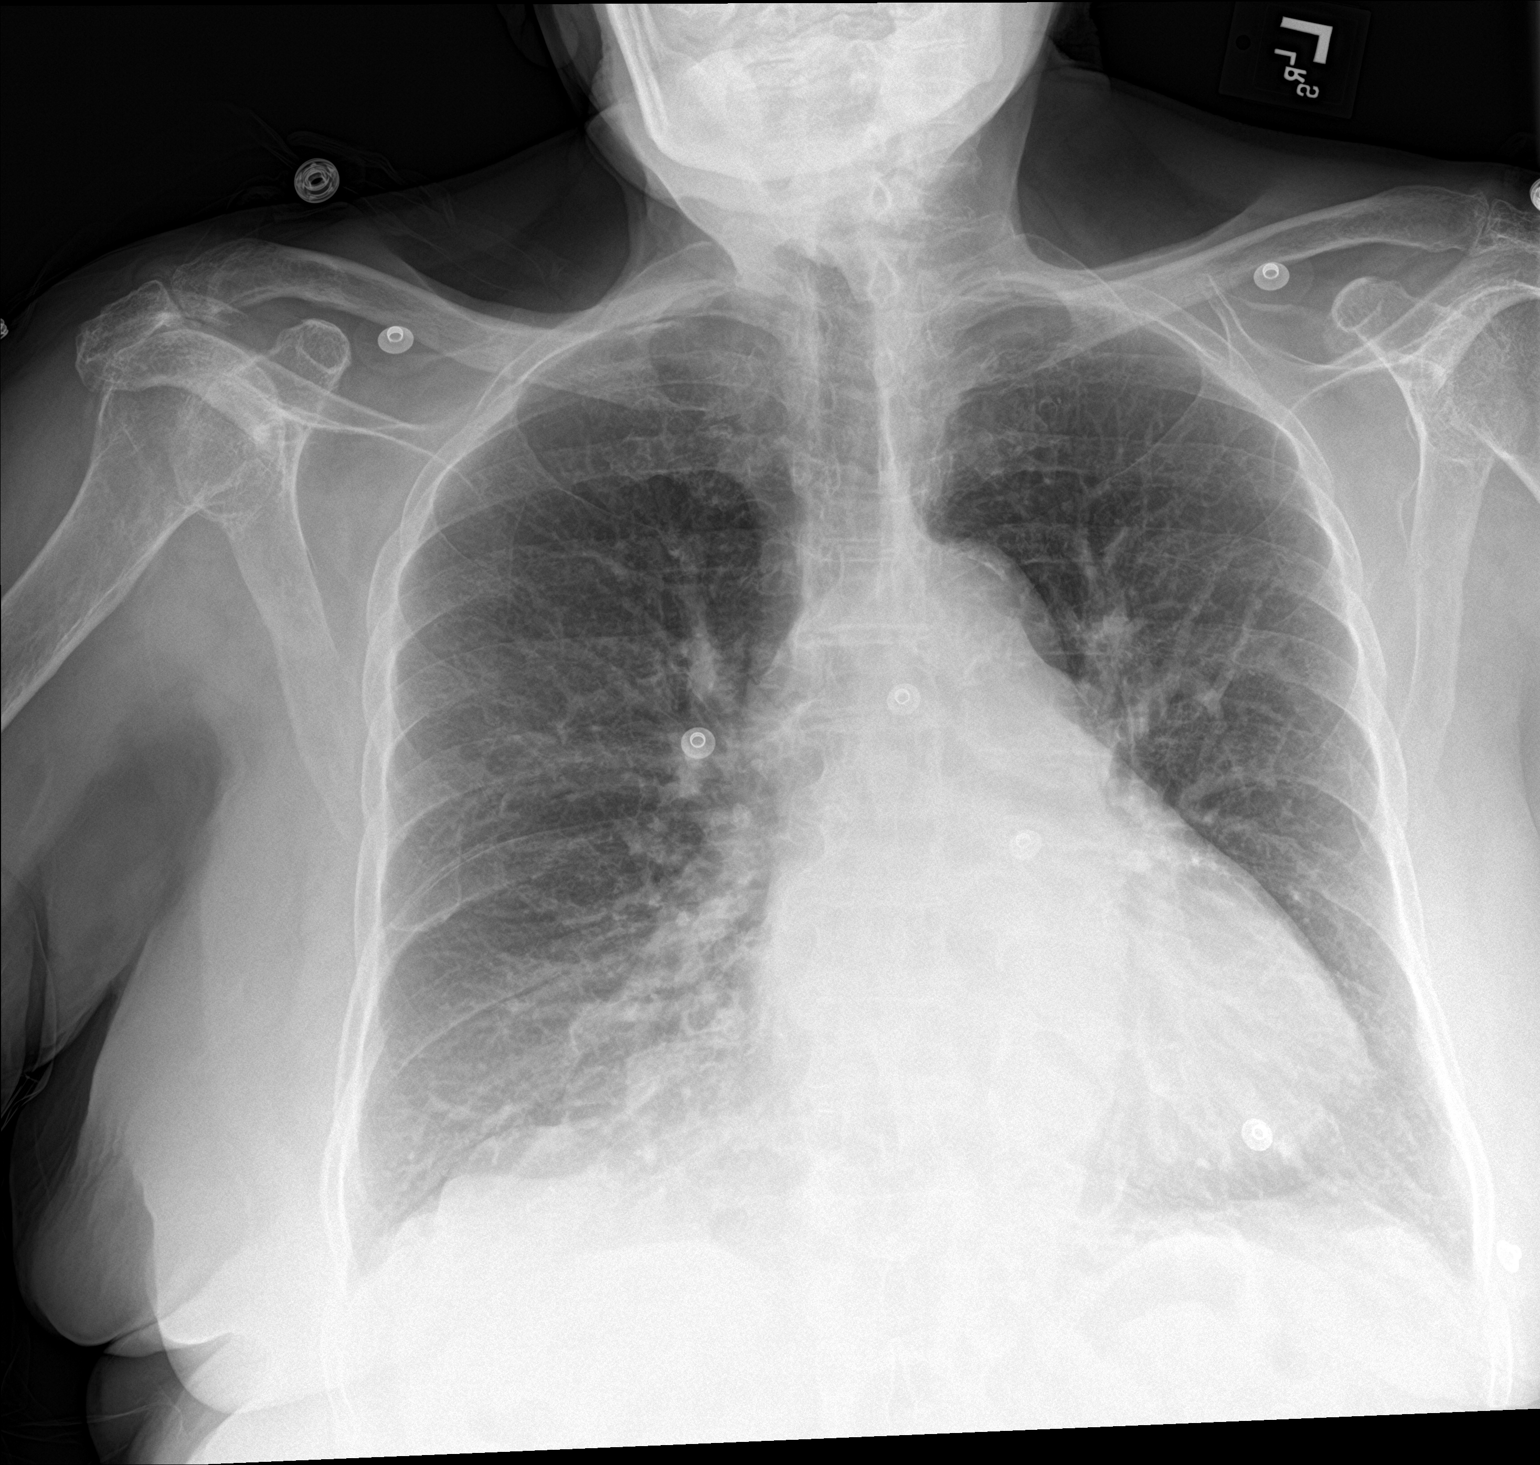

[chest lat]
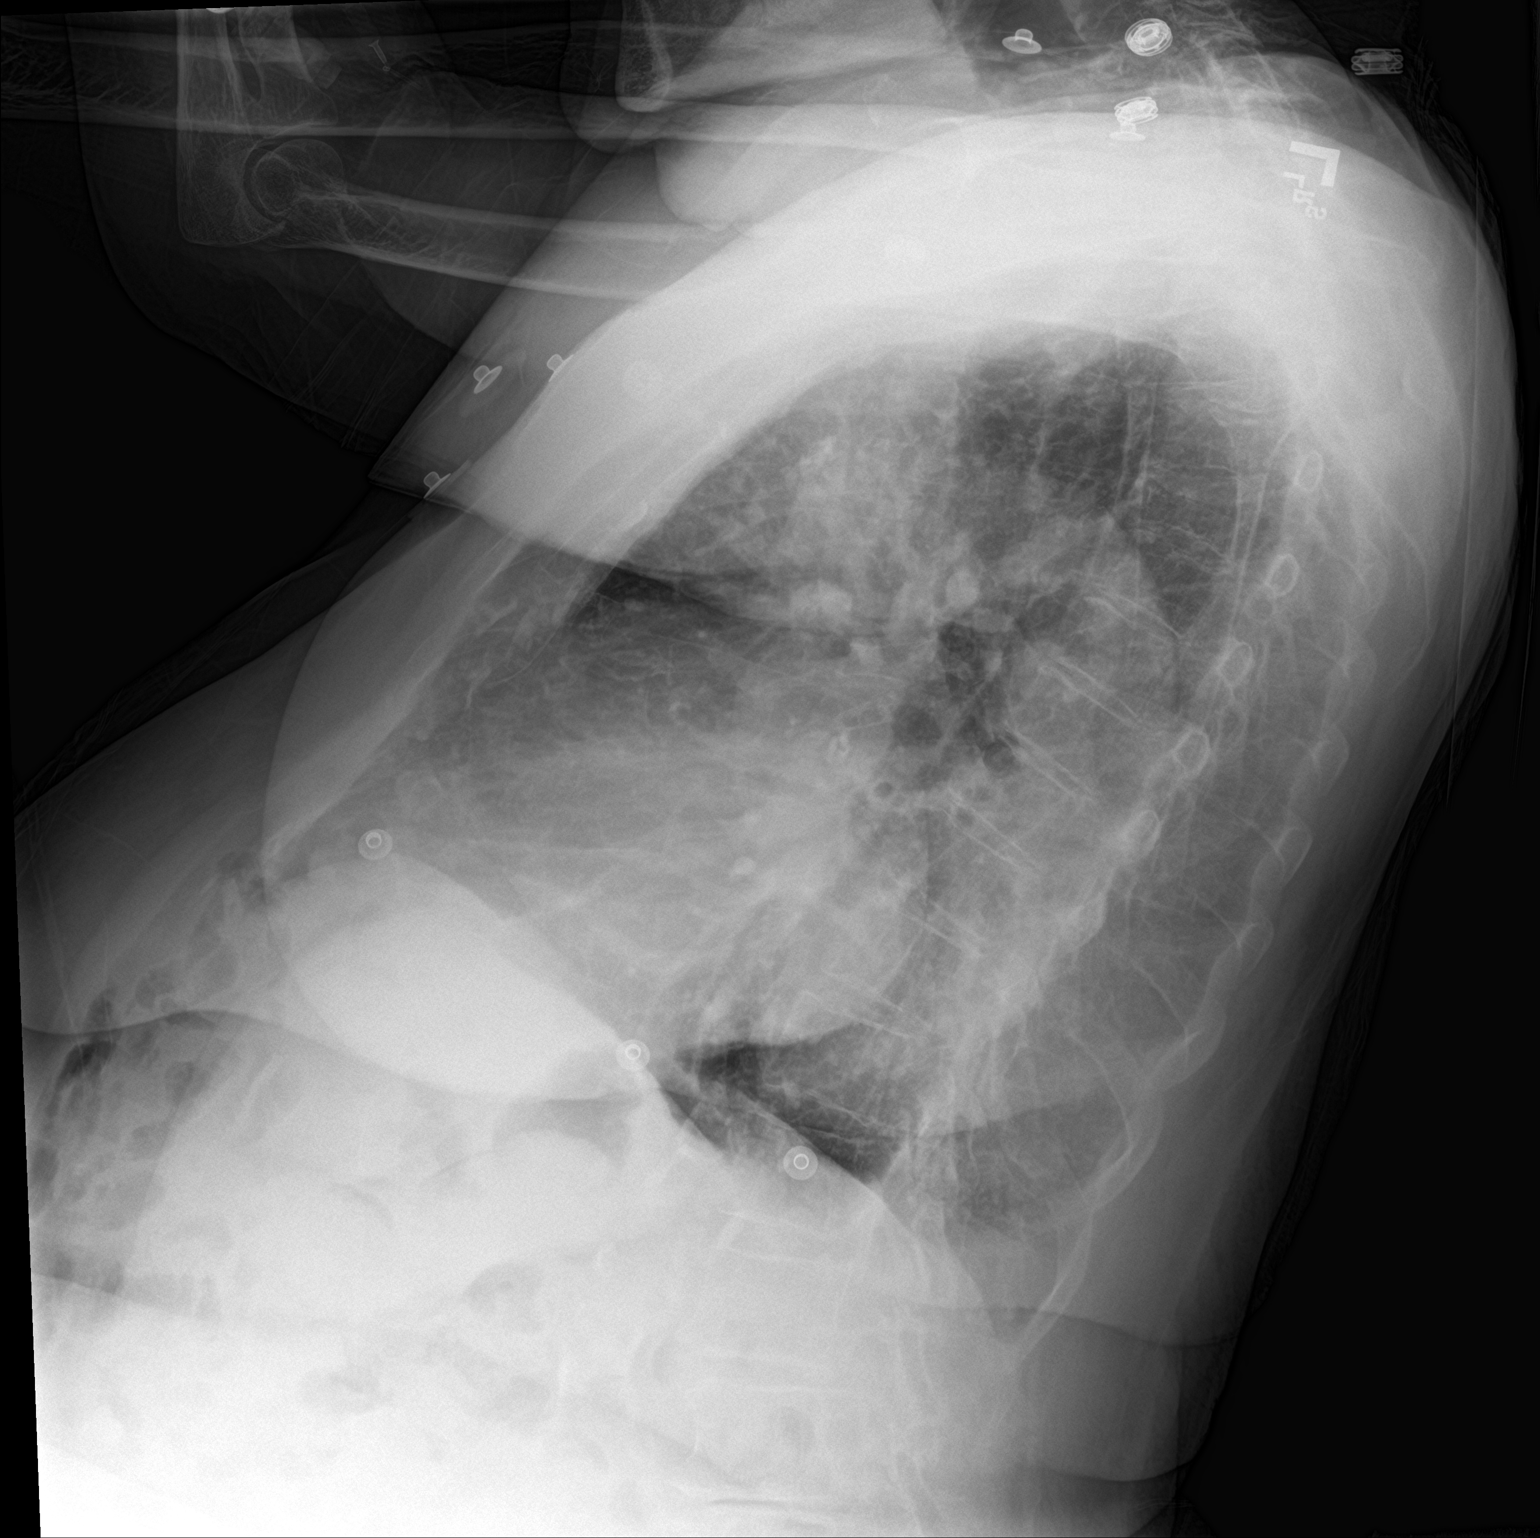

[2 of 2 positions shown; findings below may reference images not displayed]

FINDINGS: Mild hyperinflation. There is cardiomegaly with central vascular
congestion but no overt edema. No consolidation or effusion.
Atherosclerosis. No pneumothorax.
IMPRESSION: 1. Cardiomegaly with central vascular congestion but no overt edema
or effusion
2. No focal consolidation

## 2018-08-18 ENCOUNTER — Encounter: Payer: Self-pay | Admitting: Internal Medicine

## 2018-08-18 NOTE — Progress Notes (Signed)
This encounter was created in error - please disregard.

## 2018-09-04 ENCOUNTER — Encounter: Payer: Self-pay | Admitting: Internal Medicine

## 2018-09-04 NOTE — Progress Notes (Signed)
Location:    [place] Room Number: 307/A Place of Service:  SNF [phone]) Provider: Granville Lewis PA-C  Ina Homes, MD  Patient Care Team: Ina Homes, MD as PCP - General (Internal Medicine)  Extended Emergency Contact Information Primary Emergency Contact: McAdoo,Michael Address: [address]          [place] [address] [place] Phone: [phone] Mobile Phone: [phone] Relation: Son Secondary Emergency Contact: [name] Mobile Phone: [phone] Relation: Nephew  Code Status:  Full Code Goals of care: Advanced Directive information Advanced Directives 09/04/2018  Does Patient Have a Medical Advance Directive? Yes  Type of Advance Directive (No Data)  Does patient want to make changes to medical advance directive? No - Patient declined  Copy of [place] in Chart? -  Would patient like information on creating a medical advance directive? -  Pre-existing out of facility DNR order (yellow form or pink MOST form) -     Chief Complaint  Patient presents with  . Medical Management of Chronic Issues    Routine visit of medical management    HPI:  Pt is a 85 y.o. female seen today for medical management of chronic diseases.     Past Medical History:  Diagnosis Date  . Anemia   . Aortic insufficiency    mild  . Arthritis    "arms" (08/09/2015)  . CAD (coronary artery disease)    cath 08/09/2014 95% stenosis in prox to mid RCA s/p DES, 80-90% prox OM2, 50% distal LAD  . CHF (congestive heart failure) ([place])   . CKD (chronic kidney disease) stage 3, GFR 30-59 ml/min (HCC)   . Colon polyp    2009 colonoscopy, not retrieved for pathology  . Dyspnea   . Ectropion of left lower eyelid   . GERD (gastroesophageal reflux disease) 2009   EGD with benign gastric polyp too  . Hyperlipidemia   . Hypertension   . Pneumonia    history of  . Stroke ([place]) 1975  . Vitamin D  deficiency    Past Surgical History:  Procedure Laterality Date  . ABDOMINAL HYSTERECTOMY    . APPENDECTOMY    . ARTERY BIOPSY Left 12/16/2012   Procedure: BIOPSY TEMPORAL ARTERY;  Surgeon: Mal Misty, MD;  Location: [place];  Service: Vascular;  Laterality: Left;  . CARDIAC CATHETERIZATION    . LEFT HEART CATHETERIZATION WITH CORONARY ANGIOGRAM N/A 08/09/2014   Procedure: LEFT HEART CATHETERIZATION WITH CORONARY ANGIOGRAM;  Surgeon: Peter M Martinique, MD;  Location: [facility] CATH LAB;  Service: Cardiovascular;  Laterality: N/A;  . PERCUTANEOUS CORONARY STENT INTERVENTION (PCI-S)  08/09/2014   Procedure: PERCUTANEOUS CORONARY STENT INTERVENTION (PCI-S);  Surgeon: Peter M Martinique, MD;  Location: [facility] CATH LAB;  Service: Cardiovascular;;  . TONSILLECTOMY      No Known Allergies  Allergies as of 09/04/2018   No Known Allergies     Medication List       Accurate as of September 04, 2018 10:28 AM. Always use your most recent med list.        acetaminophen 325 MG tablet Commonly known as:  TYLENOL Take 650 mg by mouth every 6 (six) hours as needed for mild pain, moderate pain or headache.   albuterol 108 (90 Base) MCG/ACT inhaler Commonly known as:  PROVENTIL HFA;VENTOLIN HFA Inhale 1-2 puffs into the lungs every 6 (six) hours as needed for wheezing or shortness of breath.   Artificial  Tears 1.4 % ophthalmic solution Generic drug:  polyvinyl alcohol Place 1 drop into both eyes 2 (two) times daily. For dry eyes   aspirin EC 81 MG tablet Take 1 tablet (81 mg total) by mouth daily.   atorvastatin 40 MG tablet Commonly known as:  LIPITOR Take 1 tablet (40 mg total) by mouth daily at 6 PM.   bisoprolol 5 MG tablet Commonly known as:  ZEBETA Take 0.5 tablets (2.5 mg total) by mouth daily.   furosemide 20 MG tablet Commonly known as:  LASIX Take 1 tablet (20 mg total) by mouth daily.   isosorbide mononitrate 30 MG 24 hr tablet Commonly known as:  IMDUR Take 1 tablet (30 mg total) by mouth  daily.   losartan 100 MG tablet Commonly known as:  Cozaar Take 1 tablet (100 mg total) by mouth daily.   magnesium hydroxide 400 MG/5ML suspension Commonly known as:  MILK OF MAGNESIA Constipation  If no BM in 3 days, give 30 cc Milk of Magnesium p.o. x 1 dose in 24 hours as needed (Do not use standing constipation orders for residents with renal failure CFR less than 30. Contact MD for orders)   SENNA PLUS PO SENNA PLUS TABLET TAKE 2 TABLETS BY MOUTH AT BEDTIME FOR CONSTIPATION       Review of Systems  Immunization History  Administered Date(s) Administered  . Influenza Whole 03/28/2008  . Influenza,inj,Quad PF,6+ Mos 08/09/2013, 03/30/2015, 03/24/2017  . Pneumococcal Conjugate-13 12/08/2014  . Pneumococcal Polysaccharide-23 09/18/2012  . Tdap 02/01/2016, 06/21/2018   Pertinent  Health Maintenance Due  Topic Date Due  . INFLUENZA VACCINE  09/16/2018 (Originally 01/29/2018)  . DEXA SCAN  Completed  . PNA vac Low Risk Adult  Completed   Fall Risk  03/19/2018 10/16/2017 09/26/2017 07/14/2017 04/21/2017  Falls in the past year? No Yes No No No  Number falls in past yr: - 1 - - -  Injury with Fall? - Yes - - -  Risk Factor Category  - High Fall Risk - - -  Risk for fall due to : - Impaired mobility;Impaired balance/gait Impaired balance/gait;Impaired mobility - -  Follow up - Falls prevention discussed - - -  Comment - - - - -   Functional Status Survey:    Vitals:   09/04/18 0928  BP: 130/65  Pulse: 72  Resp: 16  Temp: (!) 97.1 F (36.2 C)  TempSrc: Oral  SpO2: 98%  Weight: 152 lb 9.6 oz (69.2 kg)  Height: 4\' 10"  (1.473 m)   Body mass index is 31.89 kg/m. Physical Exam  Labs reviewed: Recent Labs    07/25/18 1314 07/25/18 1939 07/26/18 0431 07/27/18 0410 07/28/18 0357 08/03/18 08/04/18  NA 139  --  139 137 138 143 144  K 4.1  --  3.2* 4.1 4.6 4.3 4.5  CL 109  --  109 107 108  --   --   CO2 20*  --  22 22 23   --   --   GLUCOSE 116*  --  98 92 96  --   --    BUN 15  --  14 21 21  28* 30*  CREATININE 0.97  --  1.09* 1.14* 1.25* 0.9 0.9  CALCIUM 8.0*  --  8.2* 8.2* 8.5*  --   --   MG 1.3* 2.9* 2.4  --   --   --   --    Recent Labs    07/26/18 0431  AST 20  ALT 11  ALKPHOS 65  BILITOT 1.3*  PROT 5.7*  ALBUMIN 3.0*   Recent Labs    07/01/18 1852  07/25/18 0333 07/26/18 0431 08/03/18 08/04/18  WBC 3.4*  --  3.1* 3.8* 3.3 3.2  NEUTROABS 1.7  --  1.6*  --  2 2  HGB 10.5*   < > 9.7* 9.5* 10.4* 10.0*  HCT 32.1*   < > 29.7* 28.9* 29* 29*  MCV 97.3  --  96.7 95.1  --   --   PLT 124*  --  124* 114* 141* 148*   < > = values in this interval not displayed.   Lab Results  Component Value Date   TSH 0.923 05/31/2014   Lab Results  Component Value Date   HGBA1C 5.7 (H) 06/01/2014   Lab Results  Component Value Date   CHOL 140 12/31/2016   HDL 61 12/31/2016   LDLCALC 68 12/31/2016   TRIG 57 12/31/2016   CHOLHDL 2.3 12/31/2016    Significant Diagnostic Results in last 30 days:  No results found.  Assessment/Plan There are no diagnoses linked to this encounter.   Family/ staff Communication:   Labs/tests ordered:

## 2018-09-06 NOTE — Progress Notes (Signed)
This encounter was created in error - please disregard.

## 2018-09-17 ENCOUNTER — Non-Acute Institutional Stay (SKILLED_NURSING_FACILITY): Payer: Medicare Other | Admitting: Internal Medicine

## 2018-09-17 ENCOUNTER — Encounter: Payer: Self-pay | Admitting: Internal Medicine

## 2018-09-17 DIAGNOSIS — L03116 Cellulitis of left lower limb: Secondary | ICD-10-CM | POA: Diagnosis not present

## 2018-09-17 DIAGNOSIS — I2510 Atherosclerotic heart disease of native coronary artery without angina pectoris: Secondary | ICD-10-CM | POA: Diagnosis not present

## 2018-09-17 DIAGNOSIS — E785 Hyperlipidemia, unspecified: Secondary | ICD-10-CM

## 2018-09-17 DIAGNOSIS — D61818 Other pancytopenia: Secondary | ICD-10-CM

## 2018-09-17 DIAGNOSIS — I10 Essential (primary) hypertension: Secondary | ICD-10-CM | POA: Diagnosis not present

## 2018-09-17 DIAGNOSIS — N182 Chronic kidney disease, stage 2 (mild): Secondary | ICD-10-CM | POA: Diagnosis not present

## 2018-09-17 NOTE — Progress Notes (Signed)
Location:    [facility] Room Number: 307/A Place of Service:  SNF (31)  Provider: Granville Lewis PA-C  PCP: Ina Homes, MD Patient Care Team: Ina Homes, MD as PCP - General (Internal Medicine)  Extended Emergency Contact Information Primary Emergency Contact: McAdoo,Michael Address: [address]           [address] [place] Phone: [phone] Mobile Phone: [phone] Relation: Son Secondary Emergency Contact: [name] Mobile Phone: [phone] Relation: Nephew  Code Status: Full Code Goals of care:  Advanced Directive information Advanced Directives 09/17/2018  Does Patient Have a Medical Advance Directive? Yes  Type of Advance Directive (No Data)  Does patient want to make changes to medical advance directive? No - Patient declined  Copy of [place] in Chart? -  Would patient like information on creating a medical advance directive? -  Pre-existing out of facility DNR order (yellow form or pink MOST form) -     No Known Allergies  Chief Complaint  Patient presents with  . Discharge Note    Discharge Visit    HPI:  85 y.o. female seen today for discharge from facility tomorrow  Patient has been here for short-term rehab after hospitalization for left leg cellulitis which appears to have resolved  She also has a history of CVA as well as dyslipidemia GERD colonic polyps anemia and aortic insufficiency.  Currently she has no complaints she is ambulating with a walker but would benefit from continued PT and OT.  She lives in [place] which is a high-rise apartment community for seniors she will be moving back there.  She does have a history of CHF she is on Lasix 20 mg a day in addition to Imdur Cozaar and low-dose beta-blocker he does not appear to have significant edema or shortness of breath today.  She also has a history of hypertension bisoprolol 5 mg a  day earlier in her stay she is also on Cozaar 100 mg a day this appears generally stable it appears occasionally she will have a spike systolically but this is not persistent.  In regards to chronic kidney disease last creatinine was 0.9 on lab done February 4  She also has a history apparently of pancytopenia appears relatively stable with a white count of 3.2 and a platelet count of 148 on lab done on February 4 --hemoglobin was 10.  She also had low magnesium which was supplemented in the hospital and appears to have normalized with magnesium of 1.9 on February 3.  Currently she is sitting in her wheelchair comfortably        Past Medical History:  Diagnosis Date  . Anemia   . Aortic insufficiency    mild  . Arthritis    "arms" (08/09/2015)  . CAD (coronary artery disease)    cath 08/09/2014 95% stenosis in prox to mid RCA s/p DES, 80-90% prox OM2, 50% distal LAD  . CHF (congestive heart failure) ([place])   . CKD (chronic kidney disease) stage 3, GFR 30-59 ml/min (HCC)   . Colon polyp    2009 colonoscopy, not retrieved for pathology  . Dyspnea   . Ectropion of left lower eyelid   . GERD (gastroesophageal reflux disease) 2009   EGD with benign gastric polyp too  . Hyperlipidemia   . Hypertension   . Pneumonia    history of  . Stroke ([place]) 1975  . Vitamin D deficiency  Past Surgical History:  Procedure Laterality Date  . ABDOMINAL HYSTERECTOMY    . APPENDECTOMY    . ARTERY BIOPSY Left 12/16/2012   Procedure: BIOPSY TEMPORAL ARTERY;  Surgeon: Mal Misty, MD;  Location: [facility];  Service: Vascular;  Laterality: Left;  . CARDIAC CATHETERIZATION    . LEFT HEART CATHETERIZATION WITH CORONARY ANGIOGRAM N/A 08/09/2014   Procedure: LEFT HEART CATHETERIZATION WITH CORONARY ANGIOGRAM;  Surgeon: Peter M Martinique, MD;  Location: [facility] CATH LAB;  Service: Cardiovascular;  Laterality: N/A;  . PERCUTANEOUS CORONARY STENT INTERVENTION (PCI-S)  08/09/2014   Procedure: PERCUTANEOUS CORONARY  STENT INTERVENTION (PCI-S);  Surgeon: Peter M Martinique, MD;  Location: [facility] CATH LAB;  Service: Cardiovascular;;  . TONSILLECTOMY        reports that she quit smoking about 39 years ago. Her smoking use included cigarettes. She has quit using smokeless tobacco. She reports that she does not drink alcohol or use drugs. Social History   Socioeconomic History  . Marital status: Widowed    Spouse name: Not on file  . Number of children: Not on file  . Years of education: Not on file  . Highest education level: Not on file  Occupational History  . Not on file  Social Needs  . Financial resource strain: Not on file  . Food insecurity:    Worry: Not on file    Inability: Not on file  . Transportation needs:    Medical: Not on file    Non-medical: Not on file  Tobacco Use  . Smoking status: Former Smoker    Types: Cigarettes    Last attempt to quit: 07/02/1979    Years since quitting: 39.2  . Smokeless tobacco: Former Systems developer  . Tobacco comment: Started in teenage years - Quit 1980  Substance and Sexual Activity  . Alcohol use: No    Alcohol/week: 0.0 standard drinks    Comment: Quit alcohol 1975-76  . Drug use: No  . Sexual activity: Never  Lifestyle  . Physical activity:    Days per week: Not on file    Minutes per session: Not on file  . Stress: Not on file  Relationships  . Social connections:    Talks on phone: Not on file    Gets together: Not on file    Attends religious service: Not on file    Active member of club or organization: Not on file    Attends meetings of clubs or organizations: Not on file    Relationship status: Not on file  . Intimate partner violence:    Fear of current or ex partner: Not on file    Emotionally abused: Not on file    Physically abused: Not on file    Forced sexual activity: Not on file  Other Topics Concern  . Not on file  Social History Narrative   Ms. Kingsbury lives alone in an apartment in [place]. Her nephew, and her son and his  wife live nearby and visit and help often. She is independent in ADLs but depends on family for IADLs. She is ambulatory in her apartment without a walker but requires this when she leaves her apartment.    Functional Status Survey:    No Known Allergies  Pertinent  Health Maintenance Due  Topic Date Due  . INFLUENZA VACCINE  10/18/2018 (Originally 01/29/2018)  . DEXA SCAN  Completed  . PNA vac Low Risk Adult  Completed    Medications: Allergies as of 09/17/2018   No Known  Allergies     Medication List       Accurate as of September 17, 2018 12:28 PM. Always use your most recent med list.        acetaminophen 325 MG tablet Commonly known as:  TYLENOL Take 650 mg by mouth every 6 (six) hours as needed for mild pain, moderate pain or headache.   albuterol 108 (90 Base) MCG/ACT inhaler Commonly known as:  PROVENTIL HFA;VENTOLIN HFA Inhale 1-2 puffs into the lungs every 6 (six) hours as needed for wheezing or shortness of breath.   Artificial Tears 1.4 % ophthalmic solution Generic drug:  polyvinyl alcohol Place 1 drop into both eyes 2 (two) times daily. For dry eyes   aspirin EC 81 MG tablet Take 1 tablet (81 mg total) by mouth daily.   atorvastatin 40 MG tablet Commonly known as:  LIPITOR Take 1 tablet (40 mg total) by mouth daily at 6 PM.   bisoprolol 5 MG tablet Commonly known as:  ZEBETA Take 5 mg by mouth daily. For HBP   furosemide 20 MG tablet Commonly known as:  LASIX Take 20 mg by mouth.   losartan 100 MG tablet Commonly known as:  Cozaar Take 1 tablet (100 mg total) by mouth daily.   magnesium hydroxide 400 MG/5ML suspension Commonly known as:  MILK OF MAGNESIA Constipation  If no BM in 3 days, give 30 cc Milk of Magnesium p.o. x 1 dose in 24 hours as needed (Do not use standing constipation orders for residents with renal failure CFR less than 30. Contact MD for orders)   SENNA PLUS PO SENNA PLUS TABLET TAKE 2 TABLETS BY MOUTH AT BEDTIME FOR CONSTIPATION        Review of Systems   In general she is not complaining of any fever or chills.  Skin does not complain of rashes or itching.  Head ears eyes nose mouth and throat is not complaining of visual changes or sore throat.  Respiratory-- does not complain of a cough or shortness of breath  Cardiac does not complain of chest pain--or increased edema.  GI is not complaining of abdominal pain vomiting diarrhea constipation.  GU does not complain of dysuria.  Musculoskeletal continues to have some weakness lower extremities does not complain of pain currently.  Neurologic positive for weakness does not complain of dizziness syncope or headache  Psych does not complain of being depressed or anxious  Temperature is 98.4 pulse 72 respirations 16 blood pressure 129/68  oxygen saturation is 94% on room air Physical Exam  Labs reviewed: Basic Metabolic Panel: Recent Labs    07/25/18 1314 07/25/18 1939 07/26/18 0431 07/27/18 0410 07/28/18 0357 08/03/18 08/04/18  NA 139  --  139 137 138 143 144  K 4.1  --  3.2* 4.1 4.6 4.3 4.5  CL 109  --  109 107 108  --   --   CO2 20*  --  22 22 23   --   --   GLUCOSE 116*  --  98 92 96  --   --   BUN 15  --  14 21 21  28* 30*  CREATININE 0.97  --  1.09* 1.14* 1.25* 0.9 0.9  CALCIUM 8.0*  --  8.2* 8.2* 8.5*  --   --   MG 1.3* 2.9* 2.4  --   --   --   --    Liver Function Tests: Recent Labs    07/26/18 0431  AST 20  ALT 11  ALKPHOS  65  BILITOT 1.3*  PROT 5.7*  ALBUMIN 3.0*   No results for input(s): LIPASE, AMYLASE in the last 8760 hours. No results for input(s): AMMONIA in the last 8760 hours. CBC: Recent Labs    07/01/18 1852  07/25/18 0333 07/26/18 0431 08/03/18 08/04/18  WBC 3.4*  --  3.1* 3.8* 3.3 3.2  NEUTROABS 1.7  --  1.6*  --  2 2  HGB 10.5*   < > 9.7* 9.5* 10.4* 10.0*  HCT 32.1*   < > 29.7* 28.9* 29* 29*  MCV 97.3  --  96.7 95.1  --   --   PLT 124*  --  124* 114* 141* 148*   < > = values in this interval not  displayed.   Cardiac Enzymes: Recent Labs    07/25/18 0333  TROPONINI 0.03*   BNP: Invalid input(s): POCBNP CBG: No results for input(s): GLUCAP in the last 8760 hours.  Procedures and Imaging Studies During Stay: No results found.  Assessment/Plan:    #1 history of left leg cellulitis this appears to have resolved status post antibiotics continues to have some lower extremity weakness and would benefit from continued PT and OT.  2.  History of hypertension she has somewhat variable systolic blood pressures but do not note consistent elevations blood pressure today is 129/68--we will defer to primary care provider for any changes she is on Bispropol 5 mg a day and Cozaar 100 mg daily  3- history of CHF she is on Lasix 20 mg a day as well as Imdur 30 mg Cozaar 100 mg a day Bispropol 5 mg a day appears to be stable she does not complain of shortness of breath or increased edema currently   #4 history of hypokalemia and low magnesium in the hospital this appears to have normalized with supplementation- will update levels.  5 chronic kidney disease appears stable with creatinine of 0.9 will update this as well  #6 history of pancytopenia this appears stabilized last platelet count was 148,000 white count was 3.2 and hemoglobin 10 will update this.  7.  History of coronary artery disease at this point appears stabilized she is on Imdur as well as Lipitor 40 mg a day in addition to aspirin 81 mg a day.  8.  History of constipation she is on senna at night.  Again she will be going home to [facility] she will need continued PT and OT as well as home health support and expedient primary care follow-up  [phone] greater than 30 minutes spent on this discharge summary- greater than 50% of time coordinating a plan of care for numerous diagnoses  Of note we have obtain the updated lab-- results show stability hemoglobin at 11.1-platelets 132,000-white count is 3.8.  Marland Kitchen   creatinine  0.85 BUN 21 potassium is 4.0-sodium 143- magnesium is 1.7

## 2018-09-18 ENCOUNTER — Other Ambulatory Visit: Payer: Self-pay | Admitting: Internal Medicine

## 2018-09-18 ENCOUNTER — Telehealth: Payer: Self-pay | Admitting: *Deleted

## 2018-09-18 DIAGNOSIS — E785 Hyperlipidemia, unspecified: Secondary | ICD-10-CM

## 2018-09-18 DIAGNOSIS — I2510 Atherosclerotic heart disease of native coronary artery without angina pectoris: Secondary | ICD-10-CM

## 2018-09-18 MED ORDER — ASPIRIN EC 81 MG PO TBEC: 81.0000 mg | DELAYED_RELEASE_TABLET | Freq: Every day | ORAL | 0 refills | Status: DC

## 2018-09-18 MED ORDER — BISOPROLOL FUMARATE 5 MG PO TABS: 5.0000 mg | ORAL_TABLET | Freq: Every day | ORAL | 0 refills | Status: DC

## 2018-09-18 MED ORDER — SENNOSIDES-DOCUSATE SODIUM 8.6-50 MG PO TABS: 2.0000 | ORAL_TABLET | Freq: Every day | ORAL | 0 refills | Status: DC

## 2018-09-18 MED ORDER — FUROSEMIDE 20 MG PO TABS: 20.0000 mg | ORAL_TABLET | Freq: Every day | ORAL | 0 refills | Status: DC

## 2018-09-18 MED ORDER — ALBUTEROL SULFATE HFA 108 (90 BASE) MCG/ACT IN AERS: 1.0000 | INHALATION_SPRAY | Freq: Four times a day (QID) | RESPIRATORY_TRACT | 0 refills | Status: DC | PRN

## 2018-09-18 MED ORDER — ATORVASTATIN CALCIUM 40 MG PO TABS: 40.0000 mg | ORAL_TABLET | Freq: Every day | ORAL | 0 refills | Status: AC

## 2018-09-18 MED ORDER — POLYVINYL ALCOHOL 1.4 % OP SOLN: 1.0000 [drp] | Freq: Two times a day (BID) | OPHTHALMIC | 0 refills | Status: AC

## 2018-09-18 MED ORDER — LOSARTAN POTASSIUM 100 MG PO TABS: 100.0000 mg | ORAL_TABLET | Freq: Every day | ORAL | 0 refills | Status: DC

## 2018-09-18 NOTE — Telephone Encounter (Signed)
Pt will be disch from facility today, will send resume HHN orders

## 2018-09-20 DIAGNOSIS — E876 Hypokalemia: Secondary | ICD-10-CM | POA: Diagnosis not present

## 2018-09-20 DIAGNOSIS — D631 Anemia in chronic kidney disease: Secondary | ICD-10-CM | POA: Diagnosis not present

## 2018-09-20 DIAGNOSIS — I5032 Chronic diastolic (congestive) heart failure: Secondary | ICD-10-CM | POA: Diagnosis not present

## 2018-09-20 DIAGNOSIS — Z8673 Personal history of transient ischemic attack (TIA), and cerebral infarction without residual deficits: Secondary | ICD-10-CM | POA: Diagnosis not present

## 2018-09-20 DIAGNOSIS — N183 Chronic kidney disease, stage 3 (moderate): Secondary | ICD-10-CM | POA: Diagnosis not present

## 2018-09-20 DIAGNOSIS — I130 Hypertensive heart and chronic kidney disease with heart failure and stage 1 through stage 4 chronic kidney disease, or unspecified chronic kidney disease: Secondary | ICD-10-CM | POA: Diagnosis not present

## 2018-09-20 DIAGNOSIS — I2510 Atherosclerotic heart disease of native coronary artery without angina pectoris: Secondary | ICD-10-CM | POA: Diagnosis not present

## 2018-09-20 DIAGNOSIS — Z7982 Long term (current) use of aspirin: Secondary | ICD-10-CM | POA: Diagnosis not present

## 2018-09-20 DIAGNOSIS — M1990 Unspecified osteoarthritis, unspecified site: Secondary | ICD-10-CM | POA: Diagnosis not present

## 2018-09-20 DIAGNOSIS — I739 Peripheral vascular disease, unspecified: Secondary | ICD-10-CM | POA: Diagnosis not present

## 2018-09-22 DIAGNOSIS — E876 Hypokalemia: Secondary | ICD-10-CM | POA: Diagnosis not present

## 2018-09-22 DIAGNOSIS — I130 Hypertensive heart and chronic kidney disease with heart failure and stage 1 through stage 4 chronic kidney disease, or unspecified chronic kidney disease: Secondary | ICD-10-CM | POA: Diagnosis not present

## 2018-09-22 DIAGNOSIS — I5032 Chronic diastolic (congestive) heart failure: Secondary | ICD-10-CM | POA: Diagnosis not present

## 2018-09-22 DIAGNOSIS — D631 Anemia in chronic kidney disease: Secondary | ICD-10-CM | POA: Diagnosis not present

## 2018-09-22 DIAGNOSIS — N183 Chronic kidney disease, stage 3 (moderate): Secondary | ICD-10-CM | POA: Diagnosis not present

## 2018-09-22 DIAGNOSIS — I2510 Atherosclerotic heart disease of native coronary artery without angina pectoris: Secondary | ICD-10-CM | POA: Diagnosis not present

## 2018-09-23 ENCOUNTER — Telehealth: Payer: Self-pay

## 2018-09-23 DIAGNOSIS — E876 Hypokalemia: Secondary | ICD-10-CM | POA: Diagnosis not present

## 2018-09-23 DIAGNOSIS — I5032 Chronic diastolic (congestive) heart failure: Secondary | ICD-10-CM | POA: Diagnosis not present

## 2018-09-23 DIAGNOSIS — N183 Chronic kidney disease, stage 3 (moderate): Secondary | ICD-10-CM | POA: Diagnosis not present

## 2018-09-23 DIAGNOSIS — I130 Hypertensive heart and chronic kidney disease with heart failure and stage 1 through stage 4 chronic kidney disease, or unspecified chronic kidney disease: Secondary | ICD-10-CM | POA: Diagnosis not present

## 2018-09-23 DIAGNOSIS — D631 Anemia in chronic kidney disease: Secondary | ICD-10-CM | POA: Diagnosis not present

## 2018-09-23 DIAGNOSIS — I2510 Atherosclerotic heart disease of native coronary artery without angina pectoris: Secondary | ICD-10-CM | POA: Diagnosis not present

## 2018-09-23 NOTE — Telephone Encounter (Signed)
Shanon Brow with [facility] requesting VO for PT.

## 2018-09-23 NOTE — Telephone Encounter (Signed)
VO PT, 3/24 first visit eval, would like to continue with: 2x week for 4 weeks for safety, balance, walker usage, strength building also creating a safe environment, pt is somewhat a hoarder/ collector of papers, boxes, etc. VO given, do you agree?

## 2018-09-24 DIAGNOSIS — E876 Hypokalemia: Secondary | ICD-10-CM | POA: Diagnosis not present

## 2018-09-24 DIAGNOSIS — I130 Hypertensive heart and chronic kidney disease with heart failure and stage 1 through stage 4 chronic kidney disease, or unspecified chronic kidney disease: Secondary | ICD-10-CM | POA: Diagnosis not present

## 2018-09-24 DIAGNOSIS — I2510 Atherosclerotic heart disease of native coronary artery without angina pectoris: Secondary | ICD-10-CM | POA: Diagnosis not present

## 2018-09-24 DIAGNOSIS — D631 Anemia in chronic kidney disease: Secondary | ICD-10-CM | POA: Diagnosis not present

## 2018-09-24 DIAGNOSIS — I5032 Chronic diastolic (congestive) heart failure: Secondary | ICD-10-CM | POA: Diagnosis not present

## 2018-09-24 DIAGNOSIS — N183 Chronic kidney disease, stage 3 (moderate): Secondary | ICD-10-CM | POA: Diagnosis not present

## 2018-09-25 ENCOUNTER — Ambulatory Visit: Payer: Self-pay

## 2018-09-25 ENCOUNTER — Telehealth: Payer: Self-pay | Admitting: Internal Medicine

## 2018-09-25 DIAGNOSIS — E876 Hypokalemia: Secondary | ICD-10-CM | POA: Diagnosis not present

## 2018-09-25 DIAGNOSIS — D631 Anemia in chronic kidney disease: Secondary | ICD-10-CM | POA: Diagnosis not present

## 2018-09-25 DIAGNOSIS — I5032 Chronic diastolic (congestive) heart failure: Secondary | ICD-10-CM | POA: Diagnosis not present

## 2018-09-25 DIAGNOSIS — N183 Chronic kidney disease, stage 3 (moderate): Secondary | ICD-10-CM | POA: Diagnosis not present

## 2018-09-25 DIAGNOSIS — I2510 Atherosclerotic heart disease of native coronary artery without angina pectoris: Secondary | ICD-10-CM | POA: Diagnosis not present

## 2018-09-25 DIAGNOSIS — I130 Hypertensive heart and chronic kidney disease with heart failure and stage 1 through stage 4 chronic kidney disease, or unspecified chronic kidney disease: Secondary | ICD-10-CM | POA: Diagnosis not present

## 2018-09-25 NOTE — Telephone Encounter (Unsigned)
Copied from [place] [id number]. Topic: General - Other >> Sep 25, 2018  2:45 PM Celene Kras A wrote: Reason for CRM: Wenda Low, from [facility], called on behalf of pt concerning bisoprolol (ZEBETA) 5 MG tablet [[id number]] that the patients nephew picked up on Wednesday. Wenda Low states the original prescription pt was instructed to 1/2 a tablet, but the new prescription instructs patient to take a whole tablet. The best number to contact her is [phone]. Please advise.  [facility] [id number] - [address], [address] [place] [address] Phone: [phone] Fax: [phone] Not a 24 hour pharmacy; exact hours not known.

## 2018-09-28 ENCOUNTER — Telehealth: Payer: Self-pay | Admitting: Internal Medicine

## 2018-09-28 NOTE — Telephone Encounter (Signed)
[facility] Occupation Transport planner  requesting VO.

## 2018-09-29 DIAGNOSIS — D631 Anemia in chronic kidney disease: Secondary | ICD-10-CM | POA: Diagnosis not present

## 2018-09-29 DIAGNOSIS — I2510 Atherosclerotic heart disease of native coronary artery without angina pectoris: Secondary | ICD-10-CM | POA: Diagnosis not present

## 2018-09-29 DIAGNOSIS — E876 Hypokalemia: Secondary | ICD-10-CM | POA: Diagnosis not present

## 2018-09-29 DIAGNOSIS — N183 Chronic kidney disease, stage 3 (moderate): Secondary | ICD-10-CM | POA: Diagnosis not present

## 2018-09-29 DIAGNOSIS — I5032 Chronic diastolic (congestive) heart failure: Secondary | ICD-10-CM | POA: Diagnosis not present

## 2018-09-29 DIAGNOSIS — I130 Hypertensive heart and chronic kidney disease with heart failure and stage 1 through stage 4 chronic kidney disease, or unspecified chronic kidney disease: Secondary | ICD-10-CM | POA: Diagnosis not present

## 2018-09-29 NOTE — Telephone Encounter (Signed)
Call made to Occupational Therapist-requests verbal order for OT to improve with management of ADLs.. Start of care date was [facility] 3/27.  Verbal authorization was given-will send to pcp to make sure we are in agreement.Despina Hidden Cassady3/31/20203:26 PM

## 2018-09-29 NOTE — Telephone Encounter (Signed)
Agree  Thank you

## 2018-09-29 NOTE — Telephone Encounter (Signed)
Appears this change was made by cardiology. I see no reason why this should not be okay. Thank you.

## 2018-10-01 DIAGNOSIS — I5032 Chronic diastolic (congestive) heart failure: Secondary | ICD-10-CM | POA: Diagnosis not present

## 2018-10-01 DIAGNOSIS — E876 Hypokalemia: Secondary | ICD-10-CM | POA: Diagnosis not present

## 2018-10-01 DIAGNOSIS — D631 Anemia in chronic kidney disease: Secondary | ICD-10-CM | POA: Diagnosis not present

## 2018-10-01 DIAGNOSIS — I130 Hypertensive heart and chronic kidney disease with heart failure and stage 1 through stage 4 chronic kidney disease, or unspecified chronic kidney disease: Secondary | ICD-10-CM | POA: Diagnosis not present

## 2018-10-01 DIAGNOSIS — N183 Chronic kidney disease, stage 3 (moderate): Secondary | ICD-10-CM | POA: Diagnosis not present

## 2018-10-01 DIAGNOSIS — I2510 Atherosclerotic heart disease of native coronary artery without angina pectoris: Secondary | ICD-10-CM | POA: Diagnosis not present

## 2018-10-02 DIAGNOSIS — I2510 Atherosclerotic heart disease of native coronary artery without angina pectoris: Secondary | ICD-10-CM | POA: Diagnosis not present

## 2018-10-02 DIAGNOSIS — D631 Anemia in chronic kidney disease: Secondary | ICD-10-CM | POA: Diagnosis not present

## 2018-10-02 DIAGNOSIS — E876 Hypokalemia: Secondary | ICD-10-CM | POA: Diagnosis not present

## 2018-10-02 DIAGNOSIS — I130 Hypertensive heart and chronic kidney disease with heart failure and stage 1 through stage 4 chronic kidney disease, or unspecified chronic kidney disease: Secondary | ICD-10-CM | POA: Diagnosis not present

## 2018-10-02 DIAGNOSIS — N183 Chronic kidney disease, stage 3 (moderate): Secondary | ICD-10-CM | POA: Diagnosis not present

## 2018-10-02 DIAGNOSIS — I5032 Chronic diastolic (congestive) heart failure: Secondary | ICD-10-CM | POA: Diagnosis not present

## 2018-10-05 DIAGNOSIS — I130 Hypertensive heart and chronic kidney disease with heart failure and stage 1 through stage 4 chronic kidney disease, or unspecified chronic kidney disease: Secondary | ICD-10-CM | POA: Diagnosis not present

## 2018-10-05 DIAGNOSIS — N183 Chronic kidney disease, stage 3 (moderate): Secondary | ICD-10-CM | POA: Diagnosis not present

## 2018-10-05 DIAGNOSIS — D631 Anemia in chronic kidney disease: Secondary | ICD-10-CM | POA: Diagnosis not present

## 2018-10-05 DIAGNOSIS — I5032 Chronic diastolic (congestive) heart failure: Secondary | ICD-10-CM | POA: Diagnosis not present

## 2018-10-05 DIAGNOSIS — I2510 Atherosclerotic heart disease of native coronary artery without angina pectoris: Secondary | ICD-10-CM | POA: Diagnosis not present

## 2018-10-05 DIAGNOSIS — E876 Hypokalemia: Secondary | ICD-10-CM | POA: Diagnosis not present

## 2018-10-06 DIAGNOSIS — I2510 Atherosclerotic heart disease of native coronary artery without angina pectoris: Secondary | ICD-10-CM | POA: Diagnosis not present

## 2018-10-06 DIAGNOSIS — I5032 Chronic diastolic (congestive) heart failure: Secondary | ICD-10-CM | POA: Diagnosis not present

## 2018-10-06 DIAGNOSIS — I130 Hypertensive heart and chronic kidney disease with heart failure and stage 1 through stage 4 chronic kidney disease, or unspecified chronic kidney disease: Secondary | ICD-10-CM | POA: Diagnosis not present

## 2018-10-06 DIAGNOSIS — E876 Hypokalemia: Secondary | ICD-10-CM | POA: Diagnosis not present

## 2018-10-06 DIAGNOSIS — D631 Anemia in chronic kidney disease: Secondary | ICD-10-CM | POA: Diagnosis not present

## 2018-10-06 DIAGNOSIS — N183 Chronic kidney disease, stage 3 (moderate): Secondary | ICD-10-CM | POA: Diagnosis not present

## 2018-10-07 ENCOUNTER — Telehealth: Payer: Self-pay | Admitting: *Deleted

## 2018-10-07 NOTE — Telephone Encounter (Signed)
Agree  Thank you

## 2018-10-07 NOTE — Telephone Encounter (Signed)
Received call from Shanon Brow, [place] with [facility] asking for reduction in [facility] PT as patient is overwhelmed with all the services coming into home. Was HH PT 2 week 4. Verbal auth given to reduce to 1 week 2 to reinforce safety and fall prevention. Will route to PCP for agreement/denial. Hubbard Hartshorn, RN, BSN

## 2018-10-14 DIAGNOSIS — E876 Hypokalemia: Secondary | ICD-10-CM | POA: Diagnosis not present

## 2018-10-14 DIAGNOSIS — I2510 Atherosclerotic heart disease of native coronary artery without angina pectoris: Secondary | ICD-10-CM | POA: Diagnosis not present

## 2018-10-14 DIAGNOSIS — I130 Hypertensive heart and chronic kidney disease with heart failure and stage 1 through stage 4 chronic kidney disease, or unspecified chronic kidney disease: Secondary | ICD-10-CM | POA: Diagnosis not present

## 2018-10-14 DIAGNOSIS — D631 Anemia in chronic kidney disease: Secondary | ICD-10-CM | POA: Diagnosis not present

## 2018-10-14 DIAGNOSIS — N183 Chronic kidney disease, stage 3 (moderate): Secondary | ICD-10-CM | POA: Diagnosis not present

## 2018-10-14 DIAGNOSIS — I5032 Chronic diastolic (congestive) heart failure: Secondary | ICD-10-CM | POA: Diagnosis not present

## 2018-10-15 DIAGNOSIS — N183 Chronic kidney disease, stage 3 (moderate): Secondary | ICD-10-CM | POA: Diagnosis not present

## 2018-10-15 DIAGNOSIS — E876 Hypokalemia: Secondary | ICD-10-CM | POA: Diagnosis not present

## 2018-10-15 DIAGNOSIS — I130 Hypertensive heart and chronic kidney disease with heart failure and stage 1 through stage 4 chronic kidney disease, or unspecified chronic kidney disease: Secondary | ICD-10-CM | POA: Diagnosis not present

## 2018-10-15 DIAGNOSIS — I2510 Atherosclerotic heart disease of native coronary artery without angina pectoris: Secondary | ICD-10-CM | POA: Diagnosis not present

## 2018-10-15 DIAGNOSIS — I5032 Chronic diastolic (congestive) heart failure: Secondary | ICD-10-CM | POA: Diagnosis not present

## 2018-10-15 DIAGNOSIS — D631 Anemia in chronic kidney disease: Secondary | ICD-10-CM | POA: Diagnosis not present

## 2018-10-16 ENCOUNTER — Other Ambulatory Visit: Payer: Self-pay

## 2018-10-16 ENCOUNTER — Emergency Department (HOSPITAL_COMMUNITY): Payer: Medicare Other

## 2018-10-16 ENCOUNTER — Encounter (HOSPITAL_COMMUNITY): Payer: Self-pay | Admitting: Emergency Medicine

## 2018-10-16 ENCOUNTER — Emergency Department (HOSPITAL_COMMUNITY)
Admission: EM | Admit: 2018-10-16 | Discharge: 2018-10-16 | Disposition: A | Discharge status: Home / Self Care | Payer: Medicare Other | Attending: Emergency Medicine | Admitting: Emergency Medicine

## 2018-10-16 DIAGNOSIS — I2510 Atherosclerotic heart disease of native coronary artery without angina pectoris: Secondary | ICD-10-CM | POA: Diagnosis not present

## 2018-10-16 DIAGNOSIS — S4992XA Unspecified injury of left shoulder and upper arm, initial encounter: Secondary | ICD-10-CM | POA: Diagnosis not present

## 2018-10-16 DIAGNOSIS — I5032 Chronic diastolic (congestive) heart failure: Secondary | ICD-10-CM | POA: Diagnosis not present

## 2018-10-16 DIAGNOSIS — Y999 Unspecified external cause status: Secondary | ICD-10-CM | POA: Diagnosis not present

## 2018-10-16 DIAGNOSIS — M542 Cervicalgia: Secondary | ICD-10-CM | POA: Insufficient documentation

## 2018-10-16 DIAGNOSIS — I509 Heart failure, unspecified: Secondary | ICD-10-CM | POA: Insufficient documentation

## 2018-10-16 DIAGNOSIS — S0990XA Unspecified injury of head, initial encounter: Secondary | ICD-10-CM | POA: Diagnosis not present

## 2018-10-16 DIAGNOSIS — Y92039 Unspecified place in apartment as the place of occurrence of the external cause: Secondary | ICD-10-CM | POA: Insufficient documentation

## 2018-10-16 DIAGNOSIS — I130 Hypertensive heart and chronic kidney disease with heart failure and stage 1 through stage 4 chronic kidney disease, or unspecified chronic kidney disease: Secondary | ICD-10-CM | POA: Diagnosis not present

## 2018-10-16 DIAGNOSIS — R51 Headache: Secondary | ICD-10-CM | POA: Diagnosis not present

## 2018-10-16 DIAGNOSIS — Z79899 Other long term (current) drug therapy: Secondary | ICD-10-CM | POA: Diagnosis not present

## 2018-10-16 DIAGNOSIS — N183 Chronic kidney disease, stage 3 (moderate): Secondary | ICD-10-CM | POA: Insufficient documentation

## 2018-10-16 DIAGNOSIS — D631 Anemia in chronic kidney disease: Secondary | ICD-10-CM | POA: Diagnosis not present

## 2018-10-16 DIAGNOSIS — W19XXXA Unspecified fall, initial encounter: Secondary | ICD-10-CM | POA: Diagnosis not present

## 2018-10-16 DIAGNOSIS — W1830XA Fall on same level, unspecified, initial encounter: Secondary | ICD-10-CM | POA: Diagnosis not present

## 2018-10-16 DIAGNOSIS — E876 Hypokalemia: Secondary | ICD-10-CM | POA: Diagnosis not present

## 2018-10-16 DIAGNOSIS — Z7982 Long term (current) use of aspirin: Secondary | ICD-10-CM | POA: Insufficient documentation

## 2018-10-16 DIAGNOSIS — S199XXA Unspecified injury of neck, initial encounter: Secondary | ICD-10-CM | POA: Diagnosis not present

## 2018-10-16 DIAGNOSIS — R5381 Other malaise: Secondary | ICD-10-CM | POA: Diagnosis not present

## 2018-10-16 DIAGNOSIS — Y9389 Activity, other specified: Secondary | ICD-10-CM | POA: Insufficient documentation

## 2018-10-16 DIAGNOSIS — Z87891 Personal history of nicotine dependence: Secondary | ICD-10-CM | POA: Insufficient documentation

## 2018-10-16 NOTE — ED Triage Notes (Signed)
Patient arrived via ems from home. Reports tripping over scale and falling. Reports minor discomfort in forehead, denies loss of consciousness. Patient denies headache. Alert and oriented. EMS reports history of dementia. No open bruises. Neurological intact.
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
## 2018-10-16 NOTE — ED Provider Notes (Signed)
[facility] EMERGENCY DEPARTMENT Provider Note   CSN: [id number] Arrival date & time: 10/16/18  0041    History   Chief Complaint Chief Complaint  Patient presents with  . Fall    HPI Jane [name] is a 85 y.o. female.     Patient presents to the ED with a chief complaint of fall and head injury.  She states that she went to close a window at her apartment, where she stays alone, and tripped on a scale that was out of its ordinary place.  She struck the front of her head.  She did not pass out.  She denies and CP, SOB, headache, new blurred vision, slurred speech, weakness, or numbness.  She takes a baby aspirin daily. Denies any other associated symptoms.  The history is provided by the patient. No language interpreter was used.    Past Medical History:  Diagnosis Date  . Anemia   . Aortic insufficiency    mild  . Arthritis    "arms" (08/09/2015)  . CAD (coronary artery disease)    cath 08/09/2014 95% stenosis in prox to mid RCA s/p DES, 80-90% prox OM2, 50% distal LAD  . CHF (congestive heart failure) ([place])   . CKD (chronic kidney disease) stage 3, GFR 30-59 ml/min (HCC)   . Colon polyp    2009 colonoscopy, not retrieved for pathology  . Dyspnea   . Ectropion of left lower eyelid   . GERD (gastroesophageal reflux disease) 2009   EGD with benign gastric polyp too  . Hyperlipidemia   . Hypertension   . Pneumonia    history of  . Stroke ([facility]) 1975  . Vitamin D deficiency     Patient Active Problem List   Diagnosis Date Noted  . Acute on chronic heart failure with preserved ejection fraction (HFpEF) ([place]) 07/25/2018  . Hypokalemia 07/25/2018  . Cellulitis of left lower extremity   . Toenail deformity 03/19/2018  . Skin lesion of scalp 03/19/2018  . CAD (coronary artery disease) 08/25/2017  . Unsteady gait 08/11/2016  . Abdominal aortic aneurysm ([place]) 05/21/2016  . Trochanteric bursitis of left hip   . Pancytopenia ([place]) 12/27/2015  . CKD  (chronic kidney disease) 03/31/2015  . Prediabetes 10/16/2014  . Status post insertion of drug-eluting stent into right coronary artery for coronary artery disease 08/07/2014  . Dyspnea 05/31/2014  . Physical deconditioning 05/31/2014  . Heart failure with preserved ejection fraction ([place]) 12/27/2012  . Ectropion of left lower eyelid 12/17/2012  . Onychomycosis of toenail 09/18/2012  . PAD (peripheral artery disease) ([place]) 09/18/2012  . Preventative health care 09/18/2012  . Vitamin D deficiency 04/25/2008  . History of cerebrovascular accident 10/29/2007  . Hyperlipidemia 09/24/2006  . Normocytic anemia 09/24/2006  . Hypertension 09/24/2006  . OSTEOPENIA 09/24/2006  . Vitamin B12 deficiency 08/29/2006    Past Surgical History:  Procedure Laterality Date  . ABDOMINAL HYSTERECTOMY    . APPENDECTOMY    . ARTERY BIOPSY Left 12/16/2012   Procedure: BIOPSY TEMPORAL ARTERY;  Surgeon: Mal Misty, MD;  Location: [facility];  Service: Vascular;  Laterality: Left;  . CARDIAC CATHETERIZATION    . LEFT HEART CATHETERIZATION WITH CORONARY ANGIOGRAM N/A 08/09/2014   Procedure: LEFT HEART CATHETERIZATION WITH CORONARY ANGIOGRAM;  Surgeon: Peter M Martinique, MD;  Location: [facility] CATH LAB;  Service: Cardiovascular;  Laterality: N/A;  . PERCUTANEOUS CORONARY STENT INTERVENTION (PCI-S)  08/09/2014   Procedure: PERCUTANEOUS CORONARY STENT INTERVENTION (PCI-S);  Surgeon: Peter M Martinique, MD;  Location: [facility] CATH LAB;  Service: Cardiovascular;;  . TONSILLECTOMY       OB History   No obstetric history on file.      Home Medications    Prior to Admission medications   Medication Sig Start Date End Date Taking? Authorizing Provider  acetaminophen (TYLENOL) 325 MG tablet Take 650 mg by mouth every 6 (six) hours as needed for mild pain, moderate pain or headache.     [provider]  albuterol (PROVENTIL HFA;VENTOLIN HFA) 108 (90 Base) MCG/ACT inhaler Inhale 1-2 puffs into the lungs every 6 (six) hours as  needed for wheezing or shortness of breath. 09/18/18   Wille Celeste, PA-C  aspirin EC 81 MG tablet Take 1 tablet (81 mg total) by mouth daily. 09/18/18   Granville Lewis C, PA-C  atorvastatin (LIPITOR) 40 MG tablet Take 1 tablet (40 mg total) by mouth daily at 6 PM. 09/18/18   Granville Lewis C, PA-C  bisoprolol (ZEBETA) 5 MG tablet Take 1 tablet (5 mg total) by mouth daily. For HBP 09/18/18   Lassen, Arlo C, PA-C  furosemide (LASIX) 20 MG tablet Take 1 tablet (20 mg total) by mouth daily. 09/18/18   Granville Lewis C, PA-C  losartan (COZAAR) 100 MG tablet Take 1 tablet (100 mg total) by mouth daily. 09/18/18 09/18/19  Granville Lewis C, PA-C  magnesium hydroxide (MILK OF MAGNESIA) 400 MG/5ML suspension Constipation  If no BM in 3 days, give 30 cc Milk of Magnesium p.o. x 1 dose in 24 hours as needed (Do not use standing constipation orders for residents with renal failure CFR less than 30. Contact MD for orders)    [provider]  polyvinyl alcohol (ARTIFICIAL TEARS) 1.4 % ophthalmic solution Place 1 drop into both eyes 2 (two) times daily. For dry eyes 09/18/18   Granville Lewis C, PA-C  senna-docusate (SENNA PLUS) 8.6-50 MG tablet Take 2 tablets by mouth at bedtime. SENNA PLUS TABLET TAKE 2 TABLETS BY MOUTH AT BEDTIME FOR CONSTIPATION 09/18/18   Wille Celeste, PA-C    Family History Family History  Problem Relation Age of Onset  . Stroke Mother   . Hypertension Mother   . Stroke Father   . Hypertension Father   . Diabetes Sister     Social History Social History   Tobacco Use  . Smoking status: Former Smoker    Types: Cigarettes    Last attempt to quit: 07/02/1979    Years since quitting: 39.3  . Smokeless tobacco: Former Systems developer  . Tobacco comment: Started in teenage years - Quit 1980  Substance Use Topics  . Alcohol use: No    Alcohol/week: 0.0 standard drinks    Comment: Quit alcohol 1975-76  . Drug use: No     Allergies   Patient has no known allergies.   Review of Systems Review  of Systems  All other systems reviewed and are negative.    Physical Exam Updated Vital Signs BP (!) 142/58 (BP Location: Right Arm)   Pulse 65   Temp 98.8 F (37.1 C) (Oral)   Resp 14   Ht 5\' 2"  (1.575 m)   Wt 69 kg   SpO2 98%   BMI 27.82 kg/m   Physical Exam Vitals signs and nursing note reviewed.  Constitutional:      Appearance: She is well-developed.  HENT:     Head: Normocephalic and atraumatic.     Comments: No evidence of traumatic head injury Eyes:     Conjunctiva/sclera:  Conjunctivae normal.     Pupils: Pupils are equal, round, and reactive to light.     Comments: ectropion  Neck:     Musculoskeletal: Normal range of motion and neck supple.  Cardiovascular:     Rate and Rhythm: Normal rate and regular rhythm.     Heart sounds: No murmur. No friction rub. No gallop.   Pulmonary:     Effort: Pulmonary effort is normal. No respiratory distress.     Breath sounds: Normal breath sounds. No wheezing or rales.  Chest:     Chest wall: No tenderness.  Abdominal:     General: Bowel sounds are normal. There is no distension.     Palpations: Abdomen is soft. There is no mass.     Tenderness: There is no abdominal tenderness. There is no guarding or rebound.  Musculoskeletal: Normal range of motion.        General: No tenderness.     Comments: No cervical spine tenderness   Skin:    General: Skin is warm and dry.  Neurological:     Mental Status: She is alert and oriented to person, place, and time.     Comments: CN 3-12 intact, speech is clear, movements are goal oriented, moves all extremities  Psychiatric:        Behavior: Behavior normal.        Thought Content: Thought content normal.        Judgment: Judgment normal.      ED Treatments / Results  Labs (all labs ordered are listed, but only abnormal results are displayed) Labs Reviewed - No data to display  EKG None  Radiology No results found.  Procedures Procedures (including critical care  time)  Medications Ordered in ED Medications - No data to display   Initial Impression / Assessment and Plan / ED Course  I have reviewed the triage vital signs and the nursing notes.  Pertinent labs & imaging results that were available during my care of the patient were reviewed by me and considered in my medical decision making (see chart for details).        Patient with fall tonight after tripping on a scale in her room.  Hit her head.  Takes aspirin, but no otherwise anticoagulated.  No syncope.  No new neuro deficits.  Imaging negative for acute findings.  Seen by and discussed with Dr. Wyvonnia Dusky, who agrees with plan for discharge.  Final Clinical Impressions(s) / ED Diagnoses   Final diagnoses:  Fall, initial encounter    ED Discharge Orders    None       Montine Circle, PA-C 10/16/18 0220    Ezequiel Essex, MD 10/16/18 [phone]

## 2018-10-16 NOTE — ED Notes (Signed)
Charge contacted nephew regarding patient's discharge. Family informed that case management would be contacted in the morning. Plans to follow up with nephew in the morning.

## 2018-10-19 ENCOUNTER — Telehealth: Payer: Self-pay

## 2018-10-19 DIAGNOSIS — E876 Hypokalemia: Secondary | ICD-10-CM | POA: Diagnosis not present

## 2018-10-19 DIAGNOSIS — N183 Chronic kidney disease, stage 3 (moderate): Secondary | ICD-10-CM | POA: Diagnosis not present

## 2018-10-19 DIAGNOSIS — I2510 Atherosclerotic heart disease of native coronary artery without angina pectoris: Secondary | ICD-10-CM | POA: Diagnosis not present

## 2018-10-19 DIAGNOSIS — R0602 Shortness of breath: Secondary | ICD-10-CM | POA: Diagnosis not present

## 2018-10-19 DIAGNOSIS — I130 Hypertensive heart and chronic kidney disease with heart failure and stage 1 through stage 4 chronic kidney disease, or unspecified chronic kidney disease: Secondary | ICD-10-CM | POA: Diagnosis not present

## 2018-10-19 DIAGNOSIS — I10 Essential (primary) hypertension: Secondary | ICD-10-CM | POA: Diagnosis not present

## 2018-10-19 DIAGNOSIS — I5032 Chronic diastolic (congestive) heart failure: Secondary | ICD-10-CM | POA: Diagnosis not present

## 2018-10-19 DIAGNOSIS — D631 Anemia in chronic kidney disease: Secondary | ICD-10-CM | POA: Diagnosis not present

## 2018-10-19 NOTE — Telephone Encounter (Signed)
Jane [name] with [facility] want to informed the doctor, pt fell on 10/16/2018. Please call back.

## 2018-10-19 NOTE — Telephone Encounter (Signed)
Return call to [place] - stated pt felled and went to the hospital on Thursday (ED visit) now back home. Stated the administrator at [facility] was questioning if pt is safe to live alone; hx of falls.

## 2018-10-20 ENCOUNTER — Observation Stay (HOSPITAL_COMMUNITY)
Admission: EM | Admit: 2018-10-20 | Discharge: 2018-10-20 | Disposition: A | Discharge status: Home / Self Care | Payer: Medicare Other | Attending: Internal Medicine | Admitting: Internal Medicine

## 2018-10-20 ENCOUNTER — Encounter (HOSPITAL_COMMUNITY): Payer: Self-pay | Admitting: Emergency Medicine

## 2018-10-20 ENCOUNTER — Telehealth: Payer: Self-pay

## 2018-10-20 ENCOUNTER — Other Ambulatory Visit: Payer: Self-pay

## 2018-10-20 ENCOUNTER — Emergency Department (HOSPITAL_COMMUNITY): Payer: Medicare Other

## 2018-10-20 DIAGNOSIS — Z823 Family history of stroke: Secondary | ICD-10-CM | POA: Insufficient documentation

## 2018-10-20 DIAGNOSIS — E876 Hypokalemia: Secondary | ICD-10-CM

## 2018-10-20 DIAGNOSIS — D61818 Other pancytopenia: Secondary | ICD-10-CM | POA: Diagnosis not present

## 2018-10-20 DIAGNOSIS — Z79899 Other long term (current) drug therapy: Secondary | ICD-10-CM

## 2018-10-20 DIAGNOSIS — M1990 Unspecified osteoarthritis, unspecified site: Secondary | ICD-10-CM | POA: Diagnosis not present

## 2018-10-20 DIAGNOSIS — Z7982 Long term (current) use of aspirin: Secondary | ICD-10-CM | POA: Insufficient documentation

## 2018-10-20 DIAGNOSIS — H02105 Unspecified ectropion of left lower eyelid: Secondary | ICD-10-CM | POA: Diagnosis not present

## 2018-10-20 DIAGNOSIS — H109 Unspecified conjunctivitis: Secondary | ICD-10-CM | POA: Diagnosis not present

## 2018-10-20 DIAGNOSIS — I2510 Atherosclerotic heart disease of native coronary artery without angina pectoris: Secondary | ICD-10-CM

## 2018-10-20 DIAGNOSIS — M6281 Muscle weakness (generalized): Secondary | ICD-10-CM | POA: Diagnosis not present

## 2018-10-20 DIAGNOSIS — N183 Chronic kidney disease, stage 3 (moderate): Secondary | ICD-10-CM | POA: Diagnosis not present

## 2018-10-20 DIAGNOSIS — Z87891 Personal history of nicotine dependence: Secondary | ICD-10-CM | POA: Diagnosis not present

## 2018-10-20 DIAGNOSIS — N179 Acute kidney failure, unspecified: Secondary | ICD-10-CM

## 2018-10-20 DIAGNOSIS — K219 Gastro-esophageal reflux disease without esophagitis: Secondary | ICD-10-CM

## 2018-10-20 DIAGNOSIS — I5033 Acute on chronic diastolic (congestive) heart failure: Secondary | ICD-10-CM | POA: Diagnosis not present

## 2018-10-20 DIAGNOSIS — E785 Hyperlipidemia, unspecified: Secondary | ICD-10-CM

## 2018-10-20 DIAGNOSIS — Z8249 Family history of ischemic heart disease and other diseases of the circulatory system: Secondary | ICD-10-CM | POA: Insufficient documentation

## 2018-10-20 DIAGNOSIS — I739 Peripheral vascular disease, unspecified: Secondary | ICD-10-CM | POA: Insufficient documentation

## 2018-10-20 DIAGNOSIS — D631 Anemia in chronic kidney disease: Secondary | ICD-10-CM | POA: Diagnosis not present

## 2018-10-20 DIAGNOSIS — I130 Hypertensive heart and chronic kidney disease with heart failure and stage 1 through stage 4 chronic kidney disease, or unspecified chronic kidney disease: Secondary | ICD-10-CM | POA: Diagnosis not present

## 2018-10-20 DIAGNOSIS — I509 Heart failure, unspecified: Secondary | ICD-10-CM

## 2018-10-20 DIAGNOSIS — Z955 Presence of coronary angioplasty implant and graft: Secondary | ICD-10-CM

## 2018-10-20 DIAGNOSIS — R0602 Shortness of breath: Secondary | ICD-10-CM | POA: Diagnosis not present

## 2018-10-20 DIAGNOSIS — I110 Hypertensive heart disease with heart failure: Secondary | ICD-10-CM | POA: Diagnosis not present

## 2018-10-20 DIAGNOSIS — Z8673 Personal history of transient ischemic attack (TIA), and cerebral infarction without residual deficits: Secondary | ICD-10-CM | POA: Diagnosis not present

## 2018-10-20 DIAGNOSIS — R2681 Unsteadiness on feet: Secondary | ICD-10-CM | POA: Diagnosis not present

## 2018-10-20 DIAGNOSIS — Z9181 History of falling: Secondary | ICD-10-CM

## 2018-10-20 DIAGNOSIS — I5032 Chronic diastolic (congestive) heart failure: Secondary | ICD-10-CM | POA: Diagnosis not present

## 2018-10-20 LAB — CBC WITH DIFFERENTIAL/PLATELET
Abs Immature Granulocytes: 0.02 10*3/uL (ref 0.00–0.07)
Basophils Absolute: 0 10*3/uL (ref 0.0–0.1)
Basophils Relative: 0 %
Eosinophils Absolute: 0 10*3/uL (ref 0.0–0.5)
Eosinophils Relative: 1 %
HCT: 31.2 % — ABNORMAL LOW (ref 36.0–46.0)
Hemoglobin: 9.9 g/dL — ABNORMAL LOW (ref 12.0–15.0)
Immature Granulocytes: 1 %
Lymphocytes Relative: 39 %
Lymphs Abs: 1.4 10*3/uL (ref 0.7–4.0)
MCH: 30.7 pg (ref 26.0–34.0)
MCHC: 31.7 g/dL (ref 30.0–36.0)
MCV: 96.9 fL (ref 80.0–100.0)
Monocytes Absolute: 0.3 10*3/uL (ref 0.1–1.0)
Monocytes Relative: 8 %
Neutro Abs: 1.8 10*3/uL (ref 1.7–7.7)
Neutrophils Relative %: 51 %
Platelets: 118 10*3/uL — ABNORMAL LOW (ref 150–400)
RBC: 3.22 MIL/uL — ABNORMAL LOW (ref 3.87–5.11)
RDW: 13.2 % (ref 11.5–15.5)
WBC: 3.4 10*3/uL — ABNORMAL LOW (ref 4.0–10.5)
nRBC: 0 % (ref 0.0–0.2)

## 2018-10-20 LAB — COMPREHENSIVE METABOLIC PANEL
ALT: 10 U/L (ref 0–44)
AST: 19 U/L (ref 15–41)
Albumin: 3.6 g/dL (ref 3.5–5.0)
Alkaline Phosphatase: 64 U/L (ref 38–126)
Anion gap: 11 (ref 5–15)
BUN: 17 mg/dL (ref 8–23)
CO2: 22 mmol/L (ref 22–32)
Calcium: 8 mg/dL — ABNORMAL LOW (ref 8.9–10.3)
Chloride: 110 mmol/L (ref 98–111)
Creatinine, Ser: 1.28 mg/dL — ABNORMAL HIGH (ref 0.44–1.00)
GFR calc Af Amer: 44 mL/min — ABNORMAL LOW (ref >60)
GFR calc non Af Amer: 38 mL/min — ABNORMAL LOW (ref >60)
Glucose, Bld: 92 mg/dL (ref 70–99)
Potassium: 3.4 mmol/L — ABNORMAL LOW (ref 3.5–5.1)
Sodium: 143 mmol/L (ref 135–145)
Total Bilirubin: 0.9 mg/dL (ref 0.3–1.2)
Total Protein: 6.4 g/dL — ABNORMAL LOW (ref 6.5–8.1)

## 2018-10-20 LAB — BASIC METABOLIC PANEL
Anion gap: 12 (ref 5–15)
BUN: 15 mg/dL (ref 8–23)
CO2: 21 mmol/L — ABNORMAL LOW (ref 22–32)
Calcium: 8.5 mg/dL — ABNORMAL LOW (ref 8.9–10.3)
Chloride: 111 mmol/L (ref 98–111)
Creatinine, Ser: 1.16 mg/dL — ABNORMAL HIGH (ref 0.44–1.00)
GFR calc Af Amer: 49 mL/min — ABNORMAL LOW (ref >60)
GFR calc non Af Amer: 42 mL/min — ABNORMAL LOW (ref >60)
Glucose, Bld: 97 mg/dL (ref 70–99)
Potassium: 3.7 mmol/L (ref 3.5–5.1)
Sodium: 144 mmol/L (ref 135–145)

## 2018-10-20 LAB — I-STAT TROPONIN, ED: Troponin i, poc: 0.02 ng/mL (ref 0.00–0.08)

## 2018-10-20 LAB — MAGNESIUM
Magnesium: 1.5 mg/dL — ABNORMAL LOW (ref 1.7–2.4)
Magnesium: 2 mg/dL (ref 1.7–2.4)

## 2018-10-20 LAB — BRAIN NATRIURETIC PEPTIDE: B Natriuretic Peptide: 665.8 pg/mL — ABNORMAL HIGH (ref 0.0–100.0)

## 2018-10-20 MED ORDER — ACETAMINOPHEN 650 MG RE SUPP: 650.0000 mg | Freq: Four times a day (QID) | RECTAL | Status: DC | PRN

## 2018-10-20 MED ORDER — POTASSIUM CHLORIDE CRYS ER 20 MEQ PO TBCR
40.0000 meq | EXTENDED_RELEASE_TABLET | Freq: Once | ORAL | Status: AC
  Administered 2018-10-20: 02:00:00 40 meq via ORAL
  Filled 2018-10-20: qty 2

## 2018-10-20 MED ORDER — ATORVASTATIN CALCIUM 40 MG PO TABS: 40.0000 mg | ORAL_TABLET | Freq: Every day | ORAL | Status: DC

## 2018-10-20 MED ORDER — BISOPROLOL FUMARATE 5 MG PO TABS
5.0000 mg | ORAL_TABLET | Freq: Every day | ORAL | Status: DC
  Administered 2018-10-20: 5 mg via ORAL
  Filled 2018-10-20: qty 1

## 2018-10-20 MED ORDER — FUROSEMIDE 10 MG/ML IJ SOLN
20.0000 mg | Freq: Once | INTRAMUSCULAR | Status: AC
  Administered 2018-10-20: 11:00:00 20 mg via INTRAVENOUS
  Filled 2018-10-20: qty 2

## 2018-10-20 MED ORDER — ACETAMINOPHEN 325 MG PO TABS: 650.0000 mg | ORAL_TABLET | Freq: Four times a day (QID) | ORAL | Status: DC | PRN

## 2018-10-20 MED ORDER — ALBUTEROL SULFATE (2.5 MG/3ML) 0.083% IN NEBU: 2.5000 mg | INHALATION_SOLUTION | Freq: Four times a day (QID) | RESPIRATORY_TRACT | Status: DC | PRN

## 2018-10-20 MED ORDER — POLYVINYL ALCOHOL 1.4 % OP SOLN
1.0000 [drp] | Freq: Two times a day (BID) | OPHTHALMIC | Status: DC
  Administered 2018-10-20: 1 [drp] via OPHTHALMIC
  Filled 2018-10-20: qty 15

## 2018-10-20 MED ORDER — ASPIRIN EC 81 MG PO TBEC
81.0000 mg | DELAYED_RELEASE_TABLET | Freq: Every day | ORAL | Status: DC
  Administered 2018-10-20: 81 mg via ORAL
  Filled 2018-10-20: qty 1

## 2018-10-20 MED ORDER — ERYTHROMYCIN 5 MG/GM OP OINT
TOPICAL_OINTMENT | Freq: Three times a day (TID) | OPHTHALMIC | Status: DC
  Administered 2018-10-20: 10:00:00 via OPHTHALMIC
  Filled 2018-10-20: qty 3.5

## 2018-10-20 MED ORDER — FUROSEMIDE 10 MG/ML IJ SOLN
20.0000 mg | Freq: Once | INTRAMUSCULAR | Status: AC
  Administered 2018-10-20: 04:00:00 20 mg via INTRAVENOUS
  Filled 2018-10-20: qty 2

## 2018-10-20 MED ORDER — ENOXAPARIN SODIUM 30 MG/0.3ML ~~LOC~~ SOLN
30.0000 mg | SUBCUTANEOUS | Status: DC
  Administered 2018-10-20: 30 mg via SUBCUTANEOUS
  Filled 2018-10-20: qty 0.3

## 2018-10-20 MED ORDER — LOSARTAN POTASSIUM 50 MG PO TABS
100.0000 mg | ORAL_TABLET | Freq: Every day | ORAL | Status: DC
  Administered 2018-10-20: 100 mg via ORAL
  Filled 2018-10-20: qty 2

## 2018-10-20 MED ORDER — SENNOSIDES-DOCUSATE SODIUM 8.6-50 MG PO TABS: 2.0000 | ORAL_TABLET | Freq: Every evening | ORAL | Status: DC | PRN

## 2018-10-20 MED ORDER — MAGNESIUM SULFATE 2 GM/50ML IV SOLN
2.0000 g | Freq: Once | INTRAVENOUS | Status: AC
  Administered 2018-10-20: 2 g via INTRAVENOUS
  Filled 2018-10-20: qty 50

## 2018-10-20 NOTE — ED Notes (Signed)
ED TO INPATIENT HANDOFF REPORT  ED Nurse Name and Phone #:  Jess F   S Name/Age/Gender [place] 85 y.o. female Room/Bed: RESUSC/RESUSC  Code Status   Code Status: Prior  Home/SNF/Other Home Patient oriented to: self, place, time and situation Is this baseline? Yes   Triage Complete: Triage complete  Chief Complaint SOB; low grade fever  Triage Note Patient got up to go to bathroom tonight and started having some shortness of breath upon moving.  She denies chest pain, breath sounds clear bilaterally and she maintains 99% on RA for oxygen sat.     Allergies No Known Allergies  Level of Care/Admitting Diagnosis ED Disposition    ED Disposition Condition Comment   Admit  Hospital Area: [facility] [[id number]]  Level of Care: Telemetry Medical [104]  Covid Evaluation: N/A  Diagnosis: Acute exacerbation of CHF (congestive heart failure) ([place]) [818563]  Admitting Physician: Bosie Helper  Attending Physician: HOFFMAN, ERIK C [[id number]  PT Class (Do Not Modify): Observation [104]  PT Acc Code (Do Not Modify): Observation [10022]       B Medical/Surgery History Past Medical History:  Diagnosis Date  . Anemia   . Aortic insufficiency    mild  . Arthritis    "arms" (08/09/2015)  . CAD (coronary artery disease)    cath 08/09/2014 95% stenosis in prox to mid RCA s/p DES, 80-90% prox OM2, 50% distal LAD  . CHF (congestive heart failure) ([place])   . CKD (chronic kidney disease) stage 3, GFR 30-59 ml/min (HCC)   . Colon polyp    2009 colonoscopy, not retrieved for pathology  . Dyspnea   . Ectropion of left lower eyelid   . GERD (gastroesophageal reflux disease) 2009   EGD with benign gastric polyp too  . Hyperlipidemia   . Hypertension   . Pneumonia    history of  . Stroke ([place]) 1975  . Vitamin D deficiency    Past Surgical History:  Procedure Laterality Date  . ABDOMINAL HYSTERECTOMY    . APPENDECTOMY    . ARTERY BIOPSY Left 12/16/2012    Procedure: BIOPSY TEMPORAL ARTERY;  Surgeon: Mal Misty, MD;  Location: [facility];  Service: Vascular;  Laterality: Left;  . CARDIAC CATHETERIZATION    . LEFT HEART CATHETERIZATION WITH CORONARY ANGIOGRAM N/A 08/09/2014   Procedure: LEFT HEART CATHETERIZATION WITH CORONARY ANGIOGRAM;  Surgeon: Peter M Martinique, MD;  Location[facility] CATH LAB;  Service: Cardiovascular;  Laterality: N/A;  . PERCUTANEOUS CORONARY STENT INTERVENTION (PCI-S)  08/09/2014   Procedure: PERCUTANEOUS CORONARY STENT INTERVENTION (PCI-S);  Surgeon: Peter M Martinique, MD;  Location: [facility] CATH LAB;  Service: Cardiovascular;;  . TONSILLECTOMY       A IV Location/Drains/Wounds Patient Lines/Drains/Airways Status   Active Line/Drains/Airways    Name:   Placement date:   Placement time:   Site:   Days:   Peripheral IV 10/20/18 Left Forearm   10/20/18    0122    Forearm   less than 1          Intake/Output Last 24 hours  Intake/Output Summary (Last 24 hours) at 10/20/2018 0357 Last data filed at 10/20/2018 0324 Gross per 24 hour  Intake 50 ml  Output -  Net 50 ml    Labs/Imaging Results for orders placed or performed during the hospital encounter of 10/20/18 (from the past 48 hour(s))  I-Stat Troponin, ED (not at [facility])     Status: None   Collection Time: 10/20/18  12:46 AM  Result Value Ref Range   Troponin i, poc 0.02 0.00 - 0.08 ng/mL   Comment 3            Comment: Due to the release kinetics of cTnI, a negative result within the first hours of the onset of symptoms does not rule out myocardial infarction with certainty. If myocardial infarction is still suspected, repeat the test at appropriate intervals.   CBC with Differential/Platelet     Status: Abnormal   Collection Time: 10/20/18 12:55 AM  Result Value Ref Range   WBC 3.4 (L) 4.0 - 10.5 K/uL   RBC 3.22 (L) 3.87 - 5.11 MIL/uL   Hemoglobin 9.9 (L) 12.0 - 15.0 g/dL   HCT 31.2 (L) 36.0 - 46.0 %   MCV 96.9 80.0 - 100.0 fL   MCH 30.7 26.0 - 34.0 pg   MCHC 31.7  30.0 - 36.0 g/dL   RDW 13.2 11.5 - 15.5 %   Platelets 118 (L) 150 - 400 K/uL    Comment: REPEATED TO VERIFY PLATELET COUNT CONFIRMED BY SMEAR SPECIMEN CHECKED FOR CLOTS Immature Platelet Fraction may be clinically indicated, consider ordering this additional test [id number]    nRBC 0.0 0.0 - 0.2 %   Neutrophils Relative % 51 %   Neutro Abs 1.8 1.7 - 7.7 K/uL   Lymphocytes Relative 39 %   Lymphs Abs 1.4 0.7 - 4.0 K/uL   Monocytes Relative 8 %   Monocytes Absolute 0.3 0.1 - 1.0 K/uL   Eosinophils Relative 1 %   Eosinophils Absolute 0.0 0.0 - 0.5 K/uL   Basophils Relative 0 %   Basophils Absolute 0.0 0.0 - 0.1 K/uL   Immature Granulocytes 1 %   Abs Immature Granulocytes 0.02 0.00 - 0.07 K/uL    Comment: Performed at [facility], [address] [place], [place] [address]  Comprehensive metabolic panel     Status: Abnormal   Collection Time: 10/20/18 12:55 AM  Result Value Ref Range   Sodium 143 135 - 145 mmol/L   Potassium 3.4 (L) 3.5 - 5.1 mmol/L   Chloride 110 98 - 111 mmol/L   CO2 22 22 - 32 mmol/L   Glucose, Bld 92 70 - 99 mg/dL   BUN 17 8 - 23 mg/dL   Creatinine, Ser 1.28 (H) 0.44 - 1.00 mg/dL   Calcium 8.0 (L) 8.9 - 10.3 mg/dL   Total Protein 6.4 (L) 6.5 - 8.1 g/dL   Albumin 3.6 3.5 - 5.0 g/dL   AST 19 15 - 41 U/L   ALT 10 0 - 44 U/L   Alkaline Phosphatase 64 38 - 126 U/L   Total Bilirubin 0.9 0.3 - 1.2 mg/dL   GFR calc non Af Amer 38 (L) >60 mL/min   GFR calc Af Amer 44 (L) >60 mL/min   Anion gap 11 5 - 15    Comment: Performed at [facility] [address] [place], [place] [address]  Brain natriuretic peptide     Status: Abnormal   Collection Time: 10/20/18 12:55 AM  Result Value Ref Range   B Natriuretic Peptide 665.8 (H) 0.0 - 100.0 pg/mL    Comment: Performed at [facility] [address] [place], [place] [address]  Magnesium     Status: Abnormal   Collection Time: 10/20/18 12:55 AM  Result Value Ref Range   Magnesium 1.5 (L) 1.7 -  2.4 mg/dL    Comment: Performed at [facility], [address] [place], [place] [address]   Dg Chest 2 View  Result Date: 10/20/2018 CLINICAL DATA:  Shortness of breath EXAM: CHEST - 2 VIEW COMPARISON:  07/25/2018 FINDINGS: Cardiomegaly. Hyperinflation of the lungs. Vascular congestion with bibasilar opacities, likely atelectasis. No significant effusions or overt edema. No acute bony abnormality. IMPRESSION: Hyperinflation. Cardiomegaly, vascular congestion and bibasilar atelectasis. Electronically Signed   By: Rolm Baptise M.D.   On: 10/20/2018 01:07    Pending Labs [employer] (From admission, onward)    Start     Ordered   Signed and Occupational hygienist morning,   R     Signed and Held   Signed and Held  Magnesium  Once,   R     Signed and Held          Vitals/Pain Today's Vitals   10/20/18 0130 10/20/18 0200 10/20/18 0245 10/20/18 0345  BP: (!) 161/70 (!) 186/83 (!) 146/63 (!) 173/92  Pulse: 72 74 71 70  Resp: 19 16 (!) 22 (!) 21  Temp:      TempSrc:      SpO2: 99% 99% 99% 98%  PainSc:        Isolation Precautions No active isolations  Medications Medications  furosemide (LASIX) injection 20 mg (has no administration in time range)  magnesium sulfate IVPB 2 g 50 mL (0 g Intravenous Stopped 10/20/18 0324)  potassium chloride SA (K-DUR) CR tablet 40 mEq (40 mEq Oral Given 10/20/18 8251)    Mobility walks High fall risk   Focused Assessments Pulmonary Assessment Handoff:  Lung sounds:   O2 Device: Room Air        R Recommendations: See Admitting Provider Note  Report given to:   Additional Notes: n/a

## 2018-10-20 NOTE — Evaluation (Signed)
Physical Therapy Evaluation Patient Details Name: Jane [name] MRN: [id number] DOB: 11/21/1928 Today's Date: 10/20/2018   History of Present Illness  Pt is an 85 year old female with chronic diastolic CHF, HTN, CAD, hx of CVA, CKD III, and HLD who presented with dyspnea on exertion and was admitted for an acute on chronic heart failure exacerbation.  Clinical Impression  Pt admitted with above diagnosis. Pt currently with functional limitations due to the deficits listed below (see PT Problem List). At the time of PT eval pt was able to perform transfers and ambulation with gross min guard assist for balance support and safety. Pt with decreased safety awareness at times and feel she is a high risk for falls. Pt is likely near baseline of function, however would recommend pt's family check in on her frequently to ensure pt is safe. Pt will benefit from skilled PT to increase their independence and safety with mobility to allow discharge to the venue listed below.       Follow Up Recommendations Home health PT;Supervision for mobility/OOB(Resume [facility] services)    Equipment Recommendations  None recommended by PT    Recommendations for Other Services       Precautions / Restrictions Precautions Precautions: Fall Restrictions Weight Bearing Restrictions: No      Mobility  Bed Mobility Overal bed mobility: Needs Assistance Bed Mobility: Sit to Supine       Sit to supine: Supervision   General bed mobility comments: Pt sitting on EOB when PT arrived. At end of session pt was able to return to supine with supervision.  Transfers Overall transfer level: Needs assistance Equipment used: Rolling walker (2 wheeled) Transfers: Sit to/from [facility] Sit to Stand: Min guard Stand pivot transfers: Min guard       General transfer comment: Min guard assist for balance support and safety. Pt intermittently taking hands off walker. No overt LOB, and pt did not appear  grossly unsteady.   Ambulation/Gait Ambulation/Gait assistance: Min guard Gait Distance (Feet): 75 Feet Assistive device: Rolling walker (2 wheeled) Gait Pattern/deviations: Step-through pattern;Decreased stride length;Trunk flexed Gait velocity: Decreased Gait velocity interpretation: <1.8 ft/sec, indicate of risk for recurrent falls General Gait Details: VC's for improved posture and general safety with RW. Pt fatigued quickly but was able to make it back to the room with occasional standing rest breaks.   Stairs            Wheelchair Mobility    Modified Rankin (Stroke Patients Only)       Balance Overall balance assessment: Needs assistance Sitting-balance support: Feet supported;No upper extremity supported Sitting balance-Leahy Scale: Fair   Postural control: Posterior lean Standing balance support: No upper extremity supported Standing balance-Leahy Scale: Fair Standing balance comment: Washing hands at sink                             Pertinent Vitals/Pain Pain Assessment: Faces Faces Pain Scale: Hurts little more Pain Location: low back Pain Descriptors / Indicators: Aching;Grimacing Pain Intervention(s): Limited activity within patient's tolerance;Monitored during session;Repositioned    Home Living Family/patient expects to be discharged to:: Private residence Living Arrangements: Alone Available Help at Discharge: Family;Available PRN/intermittently Type of Home: Apartment Home Access: Elevator     Home Layout: One level Home Equipment: Walker - 2 wheels;Tub bench;Grab bars - tub/shower;Grab bars - toilet Additional Comments: Nephew provides some meals and checks in on her intermittently. He takes her for errands.  Prior Function Level of Independence: Needs assistance   Gait / Transfers Assistance Needed: Furniture walks in apartment, uses RW for community mobility           Hand Dominance   Dominant Hand: Right     Extremity/Trunk Assessment   Upper Extremity Assessment Upper Extremity Assessment: Defer to OT evaluation    Lower Extremity Assessment Lower Extremity Assessment: Generalized weakness    Cervical / Trunk Assessment Cervical / Trunk Assessment: Kyphotic  Communication   Communication: HOH  Cognition Arousal/Alertness: Awake/alert Behavior During Therapy: WFL for tasks assessed/performed Overall Cognitive Status: Difficult to assess                                        General Comments      Exercises     Assessment/Plan    PT Assessment Patient needs continued PT services  PT Problem List Decreased strength;Decreased activity tolerance;Decreased balance;Decreased mobility;Decreased knowledge of use of DME;Decreased safety awareness;Decreased knowledge of precautions;Cardiopulmonary status limiting activity       PT Treatment Interventions DME instruction;Gait training;Functional mobility training;Therapeutic activities;Therapeutic exercise;Neuromuscular re-education;Patient/family education    PT Goals (Current goals can be found in the Care Plan section)  Acute Rehab PT Goals Patient Stated Goal: Return to her apartment and return to doing her therapies PT Goal Formulation: With patient Time For Goal Achievement: 10/27/18 Potential to Achieve Goals: Good    Frequency Min 3X/week   Barriers to discharge Decreased caregiver support Alone at times    Co-evaluation               AM-PAC PT "6 Clicks" Mobility  Outcome Measure Help needed turning from your back to your side while in a flat bed without using bedrails?: None Help needed moving from lying on your back to sitting on the side of a flat bed without using bedrails?: A Little Help needed moving to and from a bed to a chair (including a wheelchair)?: A Little Help needed standing up from a chair using your arms (e.g., wheelchair or bedside chair)?: A Little Help needed to walk in  hospital room?: A Little Help needed climbing 3-5 steps with a railing? : Total 6 Click Score: 17    End of Session Equipment Utilized During Treatment: Gait belt Activity Tolerance: Patient tolerated treatment well Patient left: in bed;with call bell/phone within reach;with bed alarm set Nurse Communication: Mobility status PT Visit Diagnosis: Unsteadiness on feet (R26.81);Muscle weakness (generalized) (M62.81);History of falling (Z91.81)    Time: 1342-1410 PT Time Calculation (min) (ACUTE ONLY): 28 min   Charges:   PT Evaluation $PT Eval Moderate Complexity: 1 Mod PT Treatments $Gait Training: 8-22 mins        Rolinda Roan, PT, DPT Acute Rehabilitation Services Pager: [phone] Office: [phone]   Thelma Comp 10/20/2018, 3:00 PM

## 2018-10-20 NOTE — TOC Initial Note (Signed)
Transition of Care [facility]) - Initial/Assessment Note    Patient Details  Name: Jane [name] MRN: [id number] Date of Birth: 28-Feb-1929  [facility] CM/SW Contact:    Jane [name]  Phone Number: [phone] 10/20/2018, 11:04 AM  Clinical Narrative:                 Patient is active with Jane [name] for [facility] services as prior to admission; DME Jane at home; her Jane [name], very supportive, he assist her with errands and brings prepared food to her home; Information on Life Line ( medical alert system) for the Jane [name] to assist the patient at home.  Expected Discharge Plan: [place] Barriers to Discharge: No Barriers Identified   Patient Goals and CMS Choice Patient states their goals for this hospitalization and ongoing recovery are:: to stay at home CMS Medicare.gov Compare Post Acute Care list provided to:: Patient Choice offered to / list presented to : Patient  Expected Discharge Plan and Services Expected Discharge Plan: [place] In-house Referral: NA Discharge Planning Services: CM Consult Post Acute Care Choice: Home Health                       HH Arranged: RN, PT, Disease Management [place] Agency: [place]  Prior Living Arrangements/Services   Lives with:: Self Patient language and need for interpreter reviewed:: Yes Do you feel safe going back to the place where you live?: Yes      Need for Family Participation in Patient Care: Yes (Comment)(supportive nephew)   Current home services: Home PT, Home RN Criminal Activity/Legal Involvement Pertinent to Current Situation/Hospitalization: No - Comment as needed  Activities of Daily Living Home Assistive Devices/Equipment: Dentures (specify type), Jane (specify type) ADL Screening (condition at time of admission) Patient's cognitive ability adequate to safely complete daily activities?: Yes Is the patient deaf or have difficulty  hearing?: No Does the patient have difficulty seeing, even when wearing glasses/contacts?: No Does the patient have difficulty concentrating, remembering, or making decisions?: Yes Patient able to express need for assistance with ADLs?: Yes Does the patient have difficulty dressing or bathing?: No Independently performs ADLs?: Yes (appropriate for developmental age) Does the patient have difficulty walking or climbing stairs?: Yes Weakness of Legs: Both Weakness of Arms/Hands: None  Permission Sought/Granted Permission sought to share information with : Case Manager Permission granted to share information with : Yes, Verbal Permission Granted  Share Information with NAME: Jane [name]  Permission granted to share info w AGENCY: [place] agency        Emotional Assessment Appearance:: Developmentally appropriate Attitude/Demeanor/Rapport: Engaged, Gracious Affect (typically observed): Accepting Orientation: : Oriented to Self, Oriented to  Time, Oriented to Place, Oriented to Situation Alcohol / Substance Use: Not Applicable Psych Involvement: No (comment)  Admission diagnosis:  SOB (shortness of breath) [R06.02] Acute on chronic diastolic congestive heart failure (HCC) [I50.33] Patient Active Problem List   Diagnosis Date Noted  . Acute exacerbation of CHF (congestive heart failure) ([place]) 10/20/2018  . Acute on chronic heart failure with preserved ejection fraction (HFpEF) ([place]) 07/25/2018  . Hypokalemia 07/25/2018  . Cellulitis of left lower extremity   . Toenail deformity 03/19/2018  . Skin lesion of scalp 03/19/2018  . CAD (coronary artery disease) 08/25/2017  . Unsteady gait 08/11/2016  . Abdominal aortic aneurysm ([place]) 05/21/2016  . Trochanteric bursitis of left hip   . Pancytopenia ([place]) 12/27/2015  . CKD (  chronic kidney disease) 03/31/2015  . Prediabetes 10/16/2014  . Status post insertion of drug-eluting stent into right coronary artery for coronary artery disease  08/07/2014  . Dyspnea 05/31/2014  . Physical deconditioning 05/31/2014  . Heart failure with preserved ejection fraction ([place]) 12/27/2012  . Ectropion of left lower eyelid 12/17/2012  . Onychomycosis of toenail 09/18/2012  . PAD (peripheral artery disease) ([place]) 09/18/2012  . Preventative health care 09/18/2012  . Vitamin D deficiency 04/25/2008  . History of cerebrovascular accident 10/29/2007  . Hyperlipidemia 09/24/2006  . Normocytic anemia 09/24/2006  . Hypertension 09/24/2006  . OSTEOPENIA 09/24/2006  . Vitamin B12 deficiency 08/29/2006   PCP:  Jane Homes, MD Pharmacy:   [facility] [place], [address] [place] [address] Phone: [phone] Fax: [phone]     Social Determinants of Health (SDOH) Interventions    Readmission Risk Interventions No flowsheet data found.

## 2018-10-20 NOTE — ED Provider Notes (Signed)
[facility] EMERGENCY DEPARTMENT Provider Note  CSN: [id number] Arrival date & time: 10/20/18 0005  Chief Complaint(s) Shortness of Breath  HPI Jane [name] is a 85 y.o. female with a history of diastolic heart failure on Lasix who presents to the emergency department with shortness of breath.  She reports that she got up to go to the bathroom and while ambulating she felt shortness of breath similar to prior CHF exacerbations.  She denied any associated chest pain, nausea, vomiting.  No recent fevers or infections.  No coughing or congestion.  No significant lower extremity edema.  Currently has no complaints.  HPI  Past Medical History Past Medical History:  Diagnosis Date  . Anemia   . Aortic insufficiency    mild  . Arthritis    "arms" (08/09/2015)  . CAD (coronary artery disease)    cath 08/09/2014 95% stenosis in prox to mid RCA s/p DES, 80-90% prox OM2, 50% distal LAD  . CHF (congestive heart failure) ([place])   . CKD (chronic kidney disease) stage 3, GFR 30-59 ml/min (HCC)   . Colon polyp    2009 colonoscopy, not retrieved for pathology  . Dyspnea   . Ectropion of left lower eyelid   . GERD (gastroesophageal reflux disease) 2009   EGD with benign gastric polyp too  . Hyperlipidemia   . Hypertension   . Pneumonia    history of  . Stroke ([place]) 1975  . Vitamin D deficiency    Patient Active Problem List   Diagnosis Date Noted  . Acute exacerbation of CHF (congestive heart failure) ([place]) 10/20/2018  . Acute on chronic heart failure with preserved ejection fraction (HFpEF) ([place]) 07/25/2018  . Hypokalemia 07/25/2018  . Cellulitis of left lower extremity   . Toenail deformity 03/19/2018  . Skin lesion of scalp 03/19/2018  . CAD (coronary artery disease) 08/25/2017  . Unsteady gait 08/11/2016  . Abdominal aortic aneurysm ([place]) 05/21/2016  . Trochanteric bursitis of left hip   . Pancytopenia ([place]) 12/27/2015  . CKD (chronic kidney disease) 03/31/2015   . Prediabetes 10/16/2014  . Status post insertion of drug-eluting stent into right coronary artery for coronary artery disease 08/07/2014  . Dyspnea 05/31/2014  . Physical deconditioning 05/31/2014  . Heart failure with preserved ejection fraction ([place]) 12/27/2012  . Ectropion of left lower eyelid 12/17/2012  . Onychomycosis of toenail 09/18/2012  . PAD (peripheral artery disease) ([place]) 09/18/2012  . Preventative health care 09/18/2012  . Vitamin D deficiency 04/25/2008  . History of cerebrovascular accident 10/29/2007  . Hyperlipidemia 09/24/2006  . Normocytic anemia 09/24/2006  . Hypertension 09/24/2006  . OSTEOPENIA 09/24/2006  . Vitamin B12 deficiency 08/29/2006   Home Medication(s) Prior to Admission medications   Medication Sig Start Date End Date Taking? Authorizing Provider  acetaminophen (TYLENOL) 325 MG tablet Take 650 mg by mouth every 6 (six) hours as needed for mild pain, moderate pain or headache.    Yes [provider]  albuterol (PROVENTIL HFA;VENTOLIN HFA) 108 (90 Base) MCG/ACT inhaler Inhale 1-2 puffs into the lungs every 6 (six) hours as needed for wheezing or shortness of breath. 09/18/18  Yes Granville Lewis C, PA-C  aspirin EC 81 MG tablet Take 1 tablet (81 mg total) by mouth daily. 09/18/18  Yes Lassen, Arlo C, PA-C  atorvastatin (LIPITOR) 40 MG tablet Take 1 tablet (40 mg total) by mouth daily at 6 PM. 09/18/18  Yes Oscar La, Arlo C, PA-C  bisoprolol (ZEBETA) 5 MG tablet Take 1 tablet (5  mg total) by mouth daily. For HBP 09/18/18  Yes Lassen, Arlo C, PA-C  furosemide (LASIX) 20 MG tablet Take 1 tablet (20 mg total) by mouth daily. 09/18/18  Yes Lassen, Arlo C, PA-C  losartan (COZAAR) 100 MG tablet Take 1 tablet (100 mg total) by mouth daily. 09/18/18 09/18/19 Yes Lassen, Arlo C, PA-C  magnesium hydroxide (MILK OF MAGNESIA) 400 MG/5ML suspension Take 30 mLs by mouth daily as needed for mild constipation.    Yes [provider]  polyvinyl alcohol (ARTIFICIAL  TEARS) 1.4 % ophthalmic solution Place 1 drop into both eyes 2 (two) times daily. For dry eyes 09/18/18  Yes Lassen, Arlo C, PA-C  senna-docusate (SENNA PLUS) 8.6-50 MG tablet Take 2 tablets by mouth at bedtime. SENNA PLUS TABLET TAKE 2 TABLETS BY MOUTH AT BEDTIME FOR CONSTIPATION Patient taking differently: Take 2 tablets by mouth at bedtime.  09/18/18  Yes Wille Celeste, PA-C                                                                                                                                    Past Surgical History Past Surgical History:  Procedure Laterality Date  . ABDOMINAL HYSTERECTOMY    . APPENDECTOMY    . ARTERY BIOPSY Left 12/16/2012   Procedure: BIOPSY TEMPORAL ARTERY;  Surgeon: Mal Misty, MD;  Location: [facility];  Service: Vascular;  Laterality: Left;  . CARDIAC CATHETERIZATION    . LEFT HEART CATHETERIZATION WITH CORONARY ANGIOGRAM N/A 08/09/2014   Procedure: LEFT HEART CATHETERIZATION WITH CORONARY ANGIOGRAM;  Surgeon: Peter M Martinique, MD;  Location: [facility] CATH LAB;  Service: Cardiovascular;  Laterality: N/A;  . PERCUTANEOUS CORONARY STENT INTERVENTION (PCI-S)  08/09/2014   Procedure: PERCUTANEOUS CORONARY STENT INTERVENTION (PCI-S);  Surgeon: Peter M Martinique, MD;  Location: [facility] CATH LAB;  Service: Cardiovascular;;  . TONSILLECTOMY     Family History Family History  Problem Relation Age of Onset  . Stroke Mother   . Hypertension Mother   . Stroke Father   . Hypertension Father   . Diabetes Sister     Social History Social History   Tobacco Use  . Smoking status: Former Smoker    Types: Cigarettes    Last attempt to quit: 07/02/1979    Years since quitting: 39.3  . Smokeless tobacco: Former Systems developer  . Tobacco comment: Started in teenage years - Quit 1980  Substance Use Topics  . Alcohol use: No    Alcohol/week: 0.0 standard drinks    Comment: Quit alcohol 1975-76  . Drug use: No   Allergies Patient has no known allergies.  Review of Systems Review of Systems  All other systems are reviewed and are negative for acute change except as noted in the HPI  Physical Exam Vital Signs  I have reviewed the triage vital signs BP (!) 146/63   Pulse 71   Temp 98.9 F (37.2 C) (Oral)   Resp Marland Kitchen)  22   SpO2 99%   Physical Exam Vitals signs reviewed.  Constitutional:      General: She is not in acute distress.    Appearance: She is well-developed. She is not diaphoretic.  HENT:     Head: Normocephalic and atraumatic.     Nose: Nose normal.  Eyes:     General: No scleral icterus.       Right eye: No discharge.        Left eye: No discharge.     Conjunctiva/sclera: Conjunctivae normal.     Pupils: Pupils are equal, round, and reactive to light.  Neck:     Musculoskeletal: Normal range of motion and neck supple.  Cardiovascular:     Rate and Rhythm: Normal rate and regular rhythm.     Heart sounds: No murmur. No friction rub. No gallop.   Pulmonary:     Effort: Pulmonary effort is normal. No respiratory distress.     Breath sounds: Normal breath sounds. No stridor. No rales.  Abdominal:     General: There is no distension.     Palpations: Abdomen is soft.     Tenderness: There is no abdominal tenderness.  Musculoskeletal:        General: No tenderness.  Skin:    General: Skin is warm and dry.     Findings: No erythema or rash.  Neurological:     Mental Status: She is alert and oriented to person, place, and time.     ED Results and Treatments Labs (all labs ordered are listed, but only abnormal results are displayed) Labs Reviewed  CBC WITH DIFFERENTIAL/PLATELET - Abnormal; Notable for the following components:      Result Value   WBC 3.4 (*)    RBC 3.22 (*)    Hemoglobin 9.9 (*)    HCT 31.2 (*)    Platelets 118 (*)    All other components within normal limits  COMPREHENSIVE METABOLIC PANEL - Abnormal; Notable for the following components:   Potassium 3.4 (*)    Creatinine, Ser 1.28 (*)    Calcium 8.0 (*)    Total Protein 6.4  (*)    GFR calc non Af Amer 38 (*)    GFR calc Af Amer 44 (*)    All other components within normal limits  BRAIN NATRIURETIC PEPTIDE - Abnormal; Notable for the following components:   B Natriuretic Peptide 665.8 (*)    All other components within normal limits  MAGNESIUM - Abnormal; Notable for the following components:   Magnesium 1.5 (*)    All other components within normal limits  I-STAT TROPONIN, ED                                                                                                                         EKG  EKG Interpretation  Date/Time:  Tuesday October 20 2018 00:15:16 EDT Ventricular Rate:  72 PR Interval:    QRS Duration: 88 QT Interval:  403 QTC  Calculation: 441 R Axis:   18 Text Interpretation:  Sinus rhythm Probable left atrial enlargement No significant change since last tracing Confirmed by Addison Lank [phone]) on 10/20/2018 12:17:45 AM      Radiology Dg Chest 2 View  Result Date: 10/20/2018 CLINICAL DATA:  Shortness of breath EXAM: CHEST - 2 VIEW COMPARISON:  07/25/2018 FINDINGS: Cardiomegaly. Hyperinflation of the lungs. Vascular congestion with bibasilar opacities, likely atelectasis. No significant effusions or overt edema. No acute bony abnormality. IMPRESSION: Hyperinflation. Cardiomegaly, vascular congestion and bibasilar atelectasis. Electronically Signed   By: Rolm Baptise M.D.   On: 10/20/2018 01:07   Pertinent labs & imaging results that were available during my care of the patient were reviewed by me and considered in my medical decision making (see chart for details).  Medications Ordered in ED Medications  furosemide (LASIX) injection 20 mg (has no administration in time range)  magnesium sulfate IVPB 2 g 50 mL (0 g Intravenous Stopped 10/20/18 0324)  potassium chloride SA (K-DUR) CR tablet 40 mEq (40 mEq Oral Given 10/20/18 9476)                                                                                                                                     Procedures Procedures  (including critical care time)  Medical Decision Making / ED Course I have reviewed the nursing notes for this encounter and the patient's prior records (if available in EHR or on provided paperwork).    SOB similar to prior CHF exacerbation.  On review of records it appears the patient is typically admitted to internal medicine during CHF exacerbation due to being labile with diuresing.  Work-up today is consistent with CHF exacerbation.  Noted hypomagnesemia and mild hypokalemia which were repleted.  I discussed the case with internal medicine who evaluated the patient and admitted her for further management.  Final Clinical Impression(s) / ED Diagnoses Final diagnoses:  SOB (shortness of breath)  Acute on chronic diastolic congestive heart failure ([place])      This chart was dictated using voice recognition software.  Despite best efforts to proofread,  errors can occur which can change the documentation meaning.   Fatima Blank, MD 10/20/18 [phone]

## 2018-10-20 NOTE — Progress Notes (Signed)
   Subjective: Patient reports that she has heart failure, and that yesterday she started having shortness of breath.  She denies any changes in her weight, weights between 140-145 every day. She is able to lay flat and denies orthopnea and chest pain. She does feel like her heart beats quickly sometimes when exerting herself.   She reports that she doesn't need to go the bathroom often but that she wears depends in case she doesn't make it to the bathroom.   Objective:  Vital signs in last 24 hours: Vitals:   10/20/18 0345 10/20/18 0400 10/20/18 0419 10/20/18 0725  BP: (!) 173/92 (!) 166/64 (!) 196/91 (!) 165/65  Pulse: 70 63 81 65  Resp: (!) 21 (!) 21 18 17   Temp:   97.8 F (36.6 C) 98.2 F (36.8 C)  TempSrc:   Oral Oral  SpO2: 98% 98% 97% 100%  Weight:   68 kg   Height:   5' (1.524 m)    General: Elderly female, NAD, laying in bed HEENT: bilateral ectropion present,  Cardiac: Normal rate, regular rhythm Pulmonary: fine crackles at the lung bases bilaterally, No drainage or erythema of either eye Extremity: No LE edema bilaterally, mild erythema and TTP of LLE.   POCUS showed occasional B lines but <3 in multiple lung fields, relatively normal EF.  Assessment/Plan:  Active Problems:   Acute exacerbation of CHF (congestive heart failure) ([place])  Jane [name] is an 85 year old female with chronic diastolic CHF, HTN, CAD, hx of CVA, CKD III, and HLD who presented with dyspnea on exertion and was admitted for an acute on chronic heart failure exacerbation.   Acute on chronic heart failure exacerbation: She has had good output with IV Lasix this morning. She does continue to be at the upper limit of her baseline weight (currently at 68 kg; baseline: 63-68 kg). She however appears overall euvolemic on exam with only fine crackles at the lung bases. Will plan on giving one additional dose of IV Lasix this morning and discharge later this afternoon. Will follow-up PT/OT for dispo recs.   - IV Lasix 20 mg once; Will resume home Lasix 20 mg PO at discharge.  - Continue home bisoprolol and losartan - Follow-up PT/OT  Hypertension: BP elevated but within acceptable limits.  - Continue home losartan  Acute on Chronic Kidney Disease Stage III: Creatinine improved this morning (1.28->1.16) with diuresis.  - Recommend repeat BMP at follow-up visit.   FEN/GI: heart healthy diet DVT PPX: Lovenox Code status: Full code  Dispo: Anticipated discharge today.  Carroll Sage, MD 10/20/2018, 7:40 AM Pager: [phone]

## 2018-10-20 NOTE — Telephone Encounter (Signed)
Hospital TOC per Dr Maricela Bo, discharge 10/20/2018, appt 10/28/2018.

## 2018-10-20 NOTE — Telephone Encounter (Signed)
Patient hospitalized. They are assessing dispo. Thank you.

## 2018-10-20 NOTE — Evaluation (Signed)
Occupational Therapy Evaluation Patient Details Name: Jane [name] MRN: [id number] DOB: 01/01/1929 Today's Date: 10/20/2018    History of Present Illness Pt is an 85 year old female with chronic diastolic CHF, HTN, CAD, hx of CVA, CKD III, and HLD who presented with dyspnea on exertion and was admitted for an acute on chronic heart failure exacerbation.   Clinical Impression   PTA patient reports independent with ADLs and mobility, using RW for community mobility only, completing simple meal mgmt and medication mgmt without assist. Reports an "aide", but is unclear with their role. Pt admitted for above and limited by problem list below, including impaired balance, decreased activity tolerance ,generalized weakness, and decreased safety awareness.  Patient able to complete bed mobility with supervision, basic transfers with min guard assist, grooming standing at sink with min guard assist, UB ADLs with setup assist and LB ADls with min assist.  Pt declines using RW for mobility in room, but reaching for support during mobility to/from restroom; she reports "the walker gets in my way".  She is likely functioning close to baseline, but is at a high risk for falls.  Based on performance today, she will benefit from continued OT services while admitted and after dc at [facility] level in order to optimize safety and independence with ADLs.  Recommend assistance for ADLs/mobility/IADLs at this time.  Will follow with plans to complete pill box test to assess functional cognition.      Follow Up Recommendations  Home health OT;Supervision - Intermittent(assist for ADLs/mobility/IADLs )    Equipment Recommendations  3 in 1 bedside commode    Recommendations for Other Services       Precautions / Restrictions Precautions Precautions: Fall Restrictions Weight Bearing Restrictions: No      Mobility Bed Mobility Overal bed mobility: Needs Assistance Bed Mobility: Sit to Supine;Supine to Sit      Supine to sit: Supervision Sit to supine: Modified independent (Device/Increase time)   General bed mobility comments: supervision for safety  Transfers Overall transfer level: Needs assistance Equipment used: None(pt declined to use RW, voicing "I don't use it at home" ) Transfers: Sit to/from Stand Sit to Stand: Min guard Stand pivot transfers: Min guard       General transfer comment: min guard for safety and balance    Balance Overall balance assessment: Needs assistance Sitting-balance support: Feet supported;No upper extremity supported Sitting balance-Leahy Scale: Fair Sitting balance - Comments: posterior lean seated EOB but able to self correct and no assist required Postural control: Posterior lean Standing balance support: No upper extremity supported Standing balance-Leahy Scale: Fair Standing balance comment: Reliant on UE support, reaching for support during mobility and leaning foward at sink when washing hands                           ADL either performed or assessed with clinical judgement   ADL Overall ADL's : Needs assistance/impaired     Grooming: Min guard;Standing   Upper Body Bathing: Set up;Supervision/ safety;Sitting   Lower Body Bathing: Minimal assistance;Sit to/from stand   Upper Body Dressing : Supervision/safety;Set up;Sitting   Lower Body Dressing: Minimal assistance;Sit to/from stand   Toilet Transfer: Min guard;Ambulation Toilet Transfer Details (indicate cue type and reason): simulated in room Toileting- Clothing Manipulation and Hygiene: Min guard;Sit to/from stand       Functional mobility during ADLs: Min guard(pt declined to use RW ) General ADL Comments: pt limited by generalized weakness,  impaired balance, and decreased act tolerance     Vision         Perception     Praxis      Pertinent Vitals/Pain Pain Assessment: Faces Faces Pain Scale: Hurts little more Pain Location: low back Pain Descriptors /  Indicators: Aching;Grimacing Pain Intervention(s): Monitored during session;Repositioned     Hand Dominance Right   Extremity/Trunk Assessment Upper Extremity Assessment Upper Extremity Assessment: Generalized weakness   Lower Extremity Assessment Lower Extremity Assessment: Defer to PT evaluation   Cervical / Trunk Assessment Cervical / Trunk Assessment: Kyphotic   Communication Communication Communication: HOH   Cognition Arousal/Alertness: Awake/alert Behavior During Therapy: WFL for tasks assessed/performed Overall Cognitive Status: No family/caregiver present to determine baseline cognitive functioning Area of Impairment: Attention;Following commands;Safety/judgement;Awareness;Problem solving                   Current Attention Level: Sustained   Following Commands: Follows one step commands consistently;Follows one step commands with increased time Safety/Judgement: Decreased awareness of safety;Decreased awareness of deficits Awareness: Emergent Problem Solving: Slow processing;Decreased initiation;Difficulty sequencing;Requires verbal cues;Requires tactile cues General Comments: pt requires redirection to task, verbose throughout session; able to follow commands with increased time; poor safety awareness reporting "the walker gets in my way"    General Comments       Exercises     Shoulder Instructions      Home Living Family/patient expects to be discharged to:: Private residence Living Arrangements: Alone Available Help at Discharge: Family;Available PRN/intermittently Type of Home: Apartment Home Access: Elevator     Home Layout: One level     Bathroom Shower/Tub: Teacher, early years/pre: Standard Bathroom Accessibility: Yes   Home Equipment: Environmental consultant - 2 wheels;Tub bench;Grab bars - tub/shower;Grab bars - toilet   Additional Comments: Nephew provides some meals and checks in on her intermittently. He takes her for errands.        Prior Functioning/Environment Level of Independence: Needs assistance  Gait / Transfers Assistance Needed: Furniture walks in apartment, uses RW for community mobility ADL's / Homemaking Assistance Needed: pt reports sponge bathing, manages medication indepedently and completes light meal mgmt    Comments: pt reports "aides" come to assist, but unclear with how much and how often         OT Problem List: Decreased strength;Decreased activity tolerance;Impaired balance (sitting and/or standing);Decreased safety awareness;Decreased knowledge of use of DME or AE;Decreased knowledge of precautions      OT Treatment/Interventions: Self-care/ADL training;Therapeutic exercise;DME and/or AE instruction;Cognitive remediation/compensation;Therapeutic activities;Patient/family education;Balance training    OT Goals(Current goals can be found in the care plan section) Acute Rehab OT Goals Patient Stated Goal: Return to her apartment and return to doing her therapies OT Goal Formulation: With patient Time For Goal Achievement: 11/03/18 Potential to Achieve Goals: Good  OT Frequency: Min 2X/week   Barriers to D/C:            Co-evaluation              AM-PAC OT "6 Clicks" Daily Activity     Outcome Measure Help from another person eating meals?: None Help from another person taking care of personal grooming?: A Little Help from another person toileting, which includes using toliet, bedpan, or urinal?: A Little Help from another person bathing (including washing, rinsing, drying)?: A Little Help from another person to put on and taking off regular upper body clothing?: None Help from another person to put on and taking off regular lower body  clothing?: A Little 6 Click Score: 20   End of Session Nurse Communication: Mobility status  Activity Tolerance: Patient tolerated treatment well Patient left: in bed;with call bell/phone within reach;with bed alarm set  OT Visit Diagnosis:  Other abnormalities of gait and mobility (R26.89);Muscle weakness (generalized) (M62.81)                Time: 2637-8588 OT Time Calculation (min): 30 min Charges:  OT General Charges $OT Visit: 1 Visit OT Evaluation $OT Eval Low Complexity: 1 Low OT Treatments $Self Care/Home Management : 8-22 mins  Delight Stare, OT Acute Rehabilitation Services Pager [phone] Office [phone]   Delight Stare 10/20/2018, 4:28 PM
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
## 2018-10-20 NOTE — ED Triage Notes (Signed)
Patient got up to go to bathroom tonight and started having some shortness of breath upon moving.  She denies chest pain, breath sounds clear bilaterally and she maintains 99% on RA for oxygen sat.

## 2018-10-20 NOTE — Care Management Obs Status (Addendum)
[facility] NOTIFICATION   Patient Details  Name: ANILA BOJARSKI MRN: [id number] Date of Birth: [birth year]   Medicare Observation Status Notification Given:  Yes(Letter explained in detail, all questions answered; pt refused to sign)  Pt gave CM permission to talk to her [name], Letter explained in detail, all questions answered.   Royston Bake, RN 10/20/2018, 10:52 AM

## 2018-10-21 DIAGNOSIS — N183 Chronic kidney disease, stage 3 (moderate): Secondary | ICD-10-CM | POA: Diagnosis not present

## 2018-10-21 DIAGNOSIS — I2510 Atherosclerotic heart disease of native coronary artery without angina pectoris: Secondary | ICD-10-CM | POA: Diagnosis not present

## 2018-10-21 DIAGNOSIS — D631 Anemia in chronic kidney disease: Secondary | ICD-10-CM | POA: Diagnosis not present

## 2018-10-21 DIAGNOSIS — I5032 Chronic diastolic (congestive) heart failure: Secondary | ICD-10-CM | POA: Diagnosis not present

## 2018-10-21 DIAGNOSIS — E876 Hypokalemia: Secondary | ICD-10-CM | POA: Diagnosis not present

## 2018-10-21 DIAGNOSIS — I130 Hypertensive heart and chronic kidney disease with heart failure and stage 1 through stage 4 chronic kidney disease, or unspecified chronic kidney disease: Secondary | ICD-10-CM | POA: Diagnosis not present

## 2018-10-21 NOTE — Telephone Encounter (Signed)
Transition Care Management Follow-up Telephone Call   Date discharged?10/20/18   How have you been since you were released from the hospital? No compliants-except for dry patchy area on scalp   Do you understand why you were in the hospital? No-pt couldn't remember why she was admitted overnight.  Pt states she's very forgetful at times.    Do you understand the discharge instructions? Yes-CMA went over d/c instructions w/patient   Where were you discharged to? Home   Items Reviewed:  Medications reviewed: yes  Allergies reviewed: yes  Dietary changes reviewed: yes  Referrals reviewed: N/A   Functional Questionnaire:   Activities of Daily Living (ADLs):   She states they are independent in the following: ambulation, bathing and hygiene, feeding, continence, grooming, toileting and dressing.  Pt states she likes to take baths sitting down in tub.  Pt instructed not to do that because of fall risk.  States they require assistance with the following: per pt- does not requires assistance, but she has a cna that comes out to see her in addition to a physical therapist.   Any transportation issues/concerns?: yes, but has nephew that will take her to appts   Any patient concerns? Yes-dry patchy area on top of head/scalp.  Seen by Natale Milch (derm) in the past, pt instructed to call and make appt-contact info given   Confirmed importance and date/time of follow-up visits scheduled yes  Provider Appointment booked with [facility] for HFU on Wed Nov 29th.  Will contact pt back to see if visit can be over the phone (telehealth).   Confirmed with patient if condition begins to worsen call PCP or go to the ER.  Patient was given the office number and encouraged to call back with question or concerns.  : yes

## 2018-10-22 DIAGNOSIS — I2510 Atherosclerotic heart disease of native coronary artery without angina pectoris: Secondary | ICD-10-CM | POA: Diagnosis not present

## 2018-10-22 DIAGNOSIS — E876 Hypokalemia: Secondary | ICD-10-CM | POA: Diagnosis not present

## 2018-10-22 DIAGNOSIS — I5032 Chronic diastolic (congestive) heart failure: Secondary | ICD-10-CM | POA: Diagnosis not present

## 2018-10-22 DIAGNOSIS — N183 Chronic kidney disease, stage 3 (moderate): Secondary | ICD-10-CM | POA: Diagnosis not present

## 2018-10-22 DIAGNOSIS — D631 Anemia in chronic kidney disease: Secondary | ICD-10-CM | POA: Diagnosis not present

## 2018-10-22 DIAGNOSIS — I130 Hypertensive heart and chronic kidney disease with heart failure and stage 1 through stage 4 chronic kidney disease, or unspecified chronic kidney disease: Secondary | ICD-10-CM | POA: Diagnosis not present

## 2018-10-28 ENCOUNTER — Ambulatory Visit: Payer: Self-pay

## 2018-10-28 NOTE — Discharge Summary (Addendum)
Name: Jane [name] MRN: [id number] DOB: 1929-03-21 85 y.o. PCP: Jane Homes, MD  Date of Admission: 10/20/2018 12:05 AM Date of Discharge: 10/20/2018 Attending Physician: Dr. Joni [name]  Discharge Diagnosis: 1. Acute on chronic diastolic heart failure exacerbation 2. CKD stage 3 3. Hypertension  Discharge Medications: Allergies as of 10/20/2018   No Known Allergies     Medication List    TAKE these medications   acetaminophen 325 MG tablet Commonly known as:  TYLENOL Take 650 mg by mouth every 6 (six) hours as needed for mild pain, moderate pain or headache.   albuterol 108 (90 Base) MCG/ACT inhaler Commonly known as:  VENTOLIN HFA Inhale 1-2 puffs into the lungs every 6 (six) hours as needed for wheezing or shortness of breath.   aspirin EC 81 MG tablet Take 1 tablet (81 mg total) by mouth daily.   atorvastatin 40 MG tablet Commonly known as:  LIPITOR Take 1 tablet (40 mg total) by mouth daily at 6 PM.   bisoprolol 5 MG tablet Commonly known as:  ZEBETA Take 1 tablet (5 mg total) by mouth daily. For HBP   furosemide 20 MG tablet Commonly known as:  LASIX Take 1 tablet (20 mg total) by mouth daily.   losartan 100 MG tablet Commonly known as:  Cozaar Take 1 tablet (100 mg total) by mouth daily.   magnesium hydroxide 400 MG/5ML suspension Commonly known as:  MILK OF MAGNESIA Take 30 mLs by mouth daily as needed for mild constipation.   polyvinyl alcohol 1.4 % ophthalmic solution Commonly known as:  Artificial Tears Place 1 drop into both eyes 2 (two) times daily. For dry eyes   senna-docusate 8.6-50 MG tablet Commonly known as:  Senna Plus Take 2 tablets by mouth at bedtime. SENNA PLUS TABLET TAKE 2 TABLETS BY MOUTH AT BEDTIME FOR CONSTIPATION What changed:  additional instructions       Disposition and follow-up:   [name] was discharged from [facility] in good condition.  At the hospital follow up visit please  address:  1.   Acute on chronic HFpEF exacerbation: please assess patient's volume status and make sure that she is taking her bisoprolol, losartan, and lasix. She may need some help with medication assistance.   Patient needs to have an advance care planning visit with her family.   2.  Labs / imaging needed at time of follow-up: bmp  3.  Pending labs/ test needing follow-up: none  Follow-up Appointments: Follow-up Information    [facility]. Go on 10/28/2018.   Why:  1:45pm Contact information: [address] [place], [place] Follow up.   Specialty:  [place] Why:  They will continue to do your home health care at your home Contact information: [place] [address] [phone]           Hospital Course by problem list:  1. Acute on chronic diastolic heart failure Patient presented with dyspnea with exertion. BNP elevated in 600s, chest xray with some pulmonary vascular congestion, ekg without ischemic changes. On examination she had mild pitting edema bilaterally without jvd.   Thought to have acute HFpEF exacerbation secondary to medication non-adherence and having difficulty with medication management. Was treated with IV lasix. On day of discharge the patient weighed 68kg (baseline ranging 63-68kg). She was continued on home lasix 20mg  qd, bisoprolol  5mg  qd, and losartan 100mg  qd.    Discharge Vitals:   BP (!) 155/78 (BP Location: Left Arm)   Pulse 63   Temp 98.1 F (36.7 C) (Oral)   Resp 16   Ht 5' (1.524 m)   Wt 68 kg Comment: scale c  SpO2 97%   BMI 29.29 kg/m   Pertinent Labs, Studies, and Procedures:   BMP Latest Ref Rng & Units 10/20/2018 10/20/2018 08/04/2018  Glucose 70 - 99 mg/dL 97 92 -  BUN 8 - 23 mg/dL 15 17 30(A)  Creatinine 0.44 - 1.00 mg/dL 1.16(H) 1.28(H) 0.9  BUN/Creat Ratio 12 - 28 - - -  Sodium 135 - 145  mmol/L 144 143 144  Potassium 3.5 - 5.1 mmol/L 3.7 3.4(L) 4.5  Chloride 98 - 111 mmol/L 111 110 -  CO2 22 - 32 mmol/L 21(L) 22 -  Calcium 8.9 - 10.3 mg/dL 8.5(L) 8.0(L) -     Discharge Instructions: Discharge Instructions    (HEART FAILURE PATIENTS) Call MD:  Anytime you have any of the following symptoms: 1) 3 pound weight gain in 24 hours or 5 pounds in 1 week 2) shortness of breath, with or without a dry hacking cough 3) swelling in the hands, feet or stomach 4) if you have to sleep on extra pillows at night in order to breathe.   Complete by:  As directed    Diet - low sodium heart healthy   Complete by:  As directed    Discharge instructions   Complete by:  As directed    Thank you for allowing [place] to care for you during your admission. You were found to have fluid on your lungs from your heart failure. You were given IV Lasix (your fluid pill) which removed the fluid. Please continue your regular home oral Lasix (fluid pill).  I have made an appointment with your primary care doctor.  Please make sure that you attend this appointment so that they can follow-up.  He also recommend continuing physical therapy at home.  I have put in these orders and someone will come out to your home to work with you.  This is very important to try and get her strength up and decrease the risk of any more falls.   Increase activity slowly   Complete by:  As directed       Signed: Lars Mage, MD Pager: [phone]

## 2018-10-29 DIAGNOSIS — I5032 Chronic diastolic (congestive) heart failure: Secondary | ICD-10-CM | POA: Diagnosis not present

## 2018-10-29 DIAGNOSIS — N183 Chronic kidney disease, stage 3 (moderate): Secondary | ICD-10-CM | POA: Diagnosis not present

## 2018-10-29 DIAGNOSIS — D631 Anemia in chronic kidney disease: Secondary | ICD-10-CM | POA: Diagnosis not present

## 2018-10-29 DIAGNOSIS — E876 Hypokalemia: Secondary | ICD-10-CM | POA: Diagnosis not present

## 2018-10-29 DIAGNOSIS — I2510 Atherosclerotic heart disease of native coronary artery without angina pectoris: Secondary | ICD-10-CM | POA: Diagnosis not present

## 2018-10-29 DIAGNOSIS — I130 Hypertensive heart and chronic kidney disease with heart failure and stage 1 through stage 4 chronic kidney disease, or unspecified chronic kidney disease: Secondary | ICD-10-CM | POA: Diagnosis not present

## 2018-11-06 DIAGNOSIS — I2510 Atherosclerotic heart disease of native coronary artery without angina pectoris: Secondary | ICD-10-CM | POA: Diagnosis not present

## 2018-11-06 DIAGNOSIS — E876 Hypokalemia: Secondary | ICD-10-CM | POA: Diagnosis not present

## 2018-11-06 DIAGNOSIS — D631 Anemia in chronic kidney disease: Secondary | ICD-10-CM | POA: Diagnosis not present

## 2018-11-06 DIAGNOSIS — I130 Hypertensive heart and chronic kidney disease with heart failure and stage 1 through stage 4 chronic kidney disease, or unspecified chronic kidney disease: Secondary | ICD-10-CM | POA: Diagnosis not present

## 2018-11-06 DIAGNOSIS — I5032 Chronic diastolic (congestive) heart failure: Secondary | ICD-10-CM | POA: Diagnosis not present

## 2018-11-06 DIAGNOSIS — N183 Chronic kidney disease, stage 3 (moderate): Secondary | ICD-10-CM | POA: Diagnosis not present

## 2018-11-12 DIAGNOSIS — I2510 Atherosclerotic heart disease of native coronary artery without angina pectoris: Secondary | ICD-10-CM | POA: Diagnosis not present

## 2018-11-12 DIAGNOSIS — I5032 Chronic diastolic (congestive) heart failure: Secondary | ICD-10-CM | POA: Diagnosis not present

## 2018-11-12 DIAGNOSIS — E876 Hypokalemia: Secondary | ICD-10-CM | POA: Diagnosis not present

## 2018-11-12 DIAGNOSIS — D631 Anemia in chronic kidney disease: Secondary | ICD-10-CM | POA: Diagnosis not present

## 2018-11-12 DIAGNOSIS — N183 Chronic kidney disease, stage 3 (moderate): Secondary | ICD-10-CM | POA: Diagnosis not present

## 2018-11-12 DIAGNOSIS — I130 Hypertensive heart and chronic kidney disease with heart failure and stage 1 through stage 4 chronic kidney disease, or unspecified chronic kidney disease: Secondary | ICD-10-CM | POA: Diagnosis not present

## 2018-11-15 ENCOUNTER — Other Ambulatory Visit: Payer: Self-pay | Admitting: Internal Medicine

## 2018-11-17 ENCOUNTER — Telehealth: Payer: Self-pay | Admitting: *Deleted

## 2018-11-17 NOTE — Telephone Encounter (Addendum)
Daughter-in-law called in to [employer] stating patient was [facility]. Returned call to patient. States she's had urinary incontinence and gets [facility] trying to get to bathroom. SHOB subsides as soon as she rests. Patient thinks incontinence is due to meds. Taking lasix 20 mg daily. Given first available [facility] appt tomorrow at 0930 for telehealth visit. Patient denies pain, burning or itching with urination. Best number to call [phone]. Hubbard Hartshorn, RN, BSN

## 2018-11-18 ENCOUNTER — Ambulatory Visit (INDEPENDENT_AMBULATORY_CARE_PROVIDER_SITE_OTHER): Payer: Medicare Other | Admitting: Internal Medicine

## 2018-11-18 ENCOUNTER — Other Ambulatory Visit: Payer: Self-pay

## 2018-11-18 ENCOUNTER — Other Ambulatory Visit: Payer: Self-pay | Admitting: Internal Medicine

## 2018-11-18 DIAGNOSIS — D631 Anemia in chronic kidney disease: Secondary | ICD-10-CM | POA: Diagnosis not present

## 2018-11-18 DIAGNOSIS — H02105 Unspecified ectropion of left lower eyelid: Secondary | ICD-10-CM | POA: Diagnosis not present

## 2018-11-18 DIAGNOSIS — E876 Hypokalemia: Secondary | ICD-10-CM | POA: Diagnosis not present

## 2018-11-18 DIAGNOSIS — I10 Essential (primary) hypertension: Secondary | ICD-10-CM | POA: Diagnosis not present

## 2018-11-18 DIAGNOSIS — N3941 Urge incontinence: Secondary | ICD-10-CM | POA: Diagnosis not present

## 2018-11-18 DIAGNOSIS — R5381 Other malaise: Secondary | ICD-10-CM | POA: Diagnosis not present

## 2018-11-18 DIAGNOSIS — I130 Hypertensive heart and chronic kidney disease with heart failure and stage 1 through stage 4 chronic kidney disease, or unspecified chronic kidney disease: Secondary | ICD-10-CM | POA: Diagnosis not present

## 2018-11-18 DIAGNOSIS — I2510 Atherosclerotic heart disease of native coronary artery without angina pectoris: Secondary | ICD-10-CM

## 2018-11-18 DIAGNOSIS — I5032 Chronic diastolic (congestive) heart failure: Secondary | ICD-10-CM

## 2018-11-18 DIAGNOSIS — N183 Chronic kidney disease, stage 3 (moderate): Secondary | ICD-10-CM | POA: Diagnosis not present

## 2018-11-18 MED ORDER — LOSARTAN POTASSIUM 100 MG PO TABS: 100.0000 mg | ORAL_TABLET | Freq: Every day | ORAL | 0 refills | Status: DC

## 2018-11-18 MED ORDER — FUROSEMIDE 20 MG PO TABS: 20.0000 mg | ORAL_TABLET | Freq: Every day | ORAL | 0 refills | Status: DC

## 2018-11-18 MED ORDER — BISOPROLOL FUMARATE 5 MG PO TABS: 5.0000 mg | ORAL_TABLET | Freq: Every day | ORAL | 0 refills | Status: DC

## 2018-11-18 NOTE — Telephone Encounter (Signed)
Order for incontinence supplies faxed to [employer]. Hubbard Hartshorn, RN, BSN

## 2018-11-18 NOTE — Assessment & Plan Note (Addendum)
Patient's daughter-in-law contacted the clinic and informed us that patient was having issues with shortness of breath when she would go to the bathroom, as well as urinary incontinence.  Patient reports that she is doing fine and that she has been able to take care of herself with no issues.  She does endorse some shortness of breath but only when she is fathering to go to the bathroom.  Patient wears depends due to some urinary incontinence, she states that she is aware and has a urged good bathroom but is unable to make it to the bathroom in time.  3 incontinence questionnaire answers were 1. Yes 2. B 3. B.,  Consistent with urge incontinence.  She denied any fevers, chills, dysuria, hematuria, abdominal pain, chest pain, cough, or other symptoms. She reports that she did not want any thing inserted around her genital area, and feels that her symptoms are manageable. She was concerned that her medications could be contributing to the urinary incontinence, we discussed that her presentation seems more consistent with urge incontinence that to try doing pelvic floor exercises.  She does report that she has an aide coming out every so often, but states that service has stopped.   Plan: -Kegel exercises 3 times a day -[place] referral for RN -Continue current medications -If symptoms worsen can consider urology referral or trial of topical estrogen

## 2018-11-18 NOTE — Telephone Encounter (Signed)
Received call from [employer] rep. States patient wants him to discuss the order for incontinence supplies with patient's nephew, [name]. Call placed to [place]. He agrees with receiving free incontinence supplies and will speak with patient about this. Related this info to Alba Cory with [employer].

## 2018-11-18 NOTE — Assessment & Plan Note (Signed)
Patient has a history of heart failure with preserved ejection fraction had a short hospitalization in April 2020, she was discharged on Lasix 20 mg daily, bisoprolol 2.5 mg daily, losartan 100 mg daily.  Her discharge weight at that time was 68 kg.  Patient reports that she does have some shortness of breath but only when she is rushing to the bathroom, denies shortness of breath at any other time.  She denies any weight changes, orthopnea, chest pain, cough, fevers, chills, or lower extremity edema.  Patient reports that she checks her weight every day and did well range around 1 45-1 50, she denies any significant changes over the past few days.  Patient has been taking her medications as prescribed, she was concerned about the Lasix causing the urinary incontinence but we discussed that that was likely related urge incontinence.  We discussed that she should continue taking her medications as prescribed and we will follow-up in a few months evaluate she is in agreement.  Plan -Continue Lasix 20 mg daily -Continue bisoprolol 2.5 mg daily -Continue Losartan 100 mg daily -RTC in 3 months

## 2018-11-18 NOTE — Progress Notes (Signed)
Internal Medicine Clinic Attending  Case discussed with Dr. Krienke at the time of the visit.  We reviewed the resident's history and exam and pertinent patient test results.  I agree with the assessment, diagnosis, and plan of care documented in the resident's note.    

## 2018-11-18 NOTE — Progress Notes (Signed)
  [facility] Internal Medicine Residency Telephone Encounter Continuity Care Appointment  HPI:   This telephone encounter was created for [name] on 11/18/2018 for the following purpose/cc urinary incontinence, shortness of breath.   Past Medical History:  Past Medical History:  Diagnosis Date  . Anemia   . Aortic insufficiency    mild  . Arthritis    "arms" (08/09/2015)  . CAD (coronary artery disease)    cath 08/09/2014 95% stenosis in prox to mid RCA s/p DES, 80-90% prox OM2, 50% distal LAD  . CHF (congestive heart failure) ([place])   . CKD (chronic kidney disease) stage 3, GFR 30-59 ml/min (HCC)   . Colon polyp    2009 colonoscopy, not retrieved for pathology  . Dyspnea   . Ectropion of left lower eyelid   . GERD (gastroesophageal reflux disease) 2009   EGD with benign gastric polyp too  . Hyperlipidemia   . Hypertension   . Pneumonia    history of  . Stroke ([place]) 1975  . Vitamin D deficiency       ROS:   Patient reports urinary incontinence and shortness of breath. She denies any fevers, chills, nausea, vomiting, chest pain, coughing, lightheadedness, dizziness, dysuria, hematuria, abdominal pain, or other symptoms.    Assessment / Plan / Recommendations:   Please see A&P under problem oriented charting for assessment of the patient's acute and chronic medical conditions.   As always, pt is advised that if symptoms worsen or new symptoms arise, they should go to an urgent care facility or to to ER for further evaluation.   Consent and Medical Decision Making:   Patient discussed with Dr. Angelia Mould  This is a telephone encounter between Jane [name] and Asencion Noble on 11/18/2018 for urinary incontinence and shortness of breath. The visit was conducted with the patient located at home and Asencion Noble at [facility]. The patient's identity was confirmed using their DOB and current address. The patient has consented to being evaluated through a telephone  encounter and understands the associated risks (an examination cannot be done and the patient may need to come in for an appointment) / benefits (allows the patient to remain at home, decreasing exposure to coronavirus). I personally spent 15 minutes on medical discussion.

## 2018-11-18 NOTE — Assessment & Plan Note (Signed)
Patient reported that she was able to take and does not need assistance, she does report that she has difficulty going to the bathroom sometimes and she currently has an aid every so often, however she was unable to tell me how often they come around or when they would be coming next. Spoke with the daughter-in-law who reported that the patient is having difficulty making her own meals, difficulty with doing her daily activities, and some memory deficits.  Patient has had issues with physical deconditioning in the past and declined SNF/ALF time, stating that she preferred to stay home.  While speaking on the phone patient perseverated on the fact that daughter-in-law called her late last night, and that she had papers on the coffee table and that's why people thought she could not take care of herself, she continuously repeated these facts even after telling her that we already discussed this.  Patient was spoken to on the phone, unable to evaluate her clinical deterioration at this point.  However test.  We also discussed the treatment for urge incontinence and how to perform Keagle exercises, however patient was unable to repeat this back to me, and did not seem to have a clear understanding of how to do this.  Patient will benefit greatly from more assistance at home. Will attempt to get PCS.

## 2018-11-24 ENCOUNTER — Encounter: Payer: Self-pay | Admitting: *Deleted

## 2018-11-24 NOTE — Addendum Note (Signed)
Addended by: Hulan Fray on: 11/24/2018 07:08 PM   Modules accepted: Orders

## 2018-12-04 NOTE — Telephone Encounter (Signed)
Late entry: During conversation with patient on 11/18/2018 to get nephew's number PCS was discussed. Patient was adamant that she did not want an aide or nurse coming to her home. Nephew was notified of this and stated he would talk with patient and explain that we are trying to get as much help to her as possible. He stated he would let us know if patient changed her mind. Hubbard Hartshorn, RN, BSN

## 2018-12-10 NOTE — Telephone Encounter (Signed)
Received CMN for incontinence supplies from [employer]. Placed in Dr. Dorothyann Peng box for signature on 2 pages. Hubbard Hartshorn, RN, BSN

## 2018-12-15 NOTE — Telephone Encounter (Signed)
They are all signed. Thank you.

## 2018-12-17 ENCOUNTER — Telehealth: Payer: Self-pay | Admitting: *Deleted

## 2018-12-17 NOTE — Telephone Encounter (Signed)
[facility] calls for dates of visits info

## 2018-12-24 ENCOUNTER — Encounter: Payer: Medicare Other | Admitting: Internal Medicine

## 2018-12-31 ENCOUNTER — Encounter: Payer: Medicare Other | Admitting: Internal Medicine

## 2019-01-14 ENCOUNTER — Encounter: Payer: Medicare Other | Admitting: Internal Medicine

## 2019-01-21 ENCOUNTER — Encounter: Payer: Medicare Other | Admitting: Internal Medicine

## 2019-03-17 ENCOUNTER — Emergency Department (HOSPITAL_COMMUNITY): Payer: Medicare Other

## 2019-03-17 ENCOUNTER — Inpatient Hospital Stay (HOSPITAL_COMMUNITY)
Admission: EM | Admit: 2019-03-17 | Discharge: 2019-03-27 | DRG: 064 | Disposition: A | Discharge status: Skilled Nursing Facility | Payer: Medicare Other | Attending: Internal Medicine | Admitting: Internal Medicine

## 2019-03-17 ENCOUNTER — Encounter (HOSPITAL_COMMUNITY): Payer: Self-pay | Admitting: Emergency Medicine

## 2019-03-17 DIAGNOSIS — Z03818 Encounter for observation for suspected exposure to other biological agents ruled out: Secondary | ICD-10-CM | POA: Diagnosis not present

## 2019-03-17 DIAGNOSIS — I739 Peripheral vascular disease, unspecified: Secondary | ICD-10-CM | POA: Diagnosis present

## 2019-03-17 DIAGNOSIS — T796XXA Traumatic ischemia of muscle, initial encounter: Secondary | ICD-10-CM

## 2019-03-17 DIAGNOSIS — Z09 Encounter for follow-up examination after completed treatment for conditions other than malignant neoplasm: Secondary | ICD-10-CM

## 2019-03-17 DIAGNOSIS — Z87891 Personal history of nicotine dependence: Secondary | ICD-10-CM

## 2019-03-17 DIAGNOSIS — E46 Unspecified protein-calorie malnutrition: Secondary | ICD-10-CM | POA: Diagnosis present

## 2019-03-17 DIAGNOSIS — M2550 Pain in unspecified joint: Secondary | ICD-10-CM | POA: Diagnosis not present

## 2019-03-17 DIAGNOSIS — R7881 Bacteremia: Secondary | ICD-10-CM | POA: Diagnosis present

## 2019-03-17 DIAGNOSIS — Z79899 Other long term (current) drug therapy: Secondary | ICD-10-CM | POA: Diagnosis not present

## 2019-03-17 DIAGNOSIS — S199XXA Unspecified injury of neck, initial encounter: Secondary | ICD-10-CM | POA: Diagnosis not present

## 2019-03-17 DIAGNOSIS — R0602 Shortness of breath: Secondary | ICD-10-CM | POA: Diagnosis not present

## 2019-03-17 DIAGNOSIS — Z8701 Personal history of pneumonia (recurrent): Secondary | ICD-10-CM | POA: Diagnosis not present

## 2019-03-17 DIAGNOSIS — E559 Vitamin D deficiency, unspecified: Secondary | ICD-10-CM | POA: Diagnosis present

## 2019-03-17 DIAGNOSIS — I351 Nonrheumatic aortic (valve) insufficiency: Secondary | ICD-10-CM | POA: Diagnosis not present

## 2019-03-17 DIAGNOSIS — R402352 Coma scale, best motor response, localizes pain, at arrival to emergency department: Secondary | ICD-10-CM | POA: Diagnosis present

## 2019-03-17 DIAGNOSIS — I615 Nontraumatic intracerebral hemorrhage, intraventricular: Secondary | ICD-10-CM | POA: Diagnosis not present

## 2019-03-17 DIAGNOSIS — E785 Hyperlipidemia, unspecified: Secondary | ICD-10-CM | POA: Diagnosis present

## 2019-03-17 DIAGNOSIS — R41841 Cognitive communication deficit: Secondary | ICD-10-CM | POA: Diagnosis not present

## 2019-03-17 DIAGNOSIS — I612 Nontraumatic intracerebral hemorrhage in hemisphere, unspecified: Secondary | ICD-10-CM | POA: Diagnosis not present

## 2019-03-17 DIAGNOSIS — E876 Hypokalemia: Secondary | ICD-10-CM | POA: Diagnosis present

## 2019-03-17 DIAGNOSIS — I619 Nontraumatic intracerebral hemorrhage, unspecified: Secondary | ICD-10-CM

## 2019-03-17 DIAGNOSIS — I69191 Dysphagia following nontraumatic intracerebral hemorrhage: Secondary | ICD-10-CM | POA: Diagnosis not present

## 2019-03-17 DIAGNOSIS — I2510 Atherosclerotic heart disease of native coronary artery without angina pectoris: Secondary | ICD-10-CM | POA: Diagnosis present

## 2019-03-17 DIAGNOSIS — I161 Hypertensive emergency: Secondary | ICD-10-CM | POA: Diagnosis not present

## 2019-03-17 DIAGNOSIS — I10 Essential (primary) hypertension: Secondary | ICD-10-CM | POA: Diagnosis not present

## 2019-03-17 DIAGNOSIS — K219 Gastro-esophageal reflux disease without esophagitis: Secondary | ICD-10-CM | POA: Diagnosis present

## 2019-03-17 DIAGNOSIS — Z8673 Personal history of transient ischemic attack (TIA), and cerebral infarction without residual deficits: Secondary | ICD-10-CM

## 2019-03-17 DIAGNOSIS — Z6828 Body mass index (BMI) 28.0-28.9, adult: Secondary | ICD-10-CM | POA: Diagnosis not present

## 2019-03-17 DIAGNOSIS — R404 Transient alteration of awareness: Secondary | ICD-10-CM | POA: Diagnosis not present

## 2019-03-17 DIAGNOSIS — R4182 Altered mental status, unspecified: Secondary | ICD-10-CM

## 2019-03-17 DIAGNOSIS — D696 Thrombocytopenia, unspecified: Secondary | ICD-10-CM | POA: Diagnosis present

## 2019-03-17 DIAGNOSIS — I6300 Cerebral infarction due to thrombosis of unspecified precerebral artery: Secondary | ICD-10-CM | POA: Diagnosis not present

## 2019-03-17 DIAGNOSIS — Z955 Presence of coronary angioplasty implant and graft: Secondary | ICD-10-CM

## 2019-03-17 DIAGNOSIS — R1310 Dysphagia, unspecified: Secondary | ICD-10-CM | POA: Diagnosis present

## 2019-03-17 DIAGNOSIS — Z90710 Acquired absence of both cervix and uterus: Secondary | ICD-10-CM

## 2019-03-17 DIAGNOSIS — I5032 Chronic diastolic (congestive) heart failure: Secondary | ICD-10-CM | POA: Diagnosis present

## 2019-03-17 DIAGNOSIS — I130 Hypertensive heart and chronic kidney disease with heart failure and stage 1 through stage 4 chronic kidney disease, or unspecified chronic kidney disease: Secondary | ICD-10-CM | POA: Diagnosis present

## 2019-03-17 DIAGNOSIS — N179 Acute kidney failure, unspecified: Secondary | ICD-10-CM | POA: Diagnosis not present

## 2019-03-17 DIAGNOSIS — L8990 Pressure ulcer of unspecified site, unspecified stage: Secondary | ICD-10-CM | POA: Insufficient documentation

## 2019-03-17 DIAGNOSIS — R1111 Vomiting without nausea: Secondary | ICD-10-CM | POA: Diagnosis not present

## 2019-03-17 DIAGNOSIS — N390 Urinary tract infection, site not specified: Secondary | ICD-10-CM | POA: Diagnosis not present

## 2019-03-17 DIAGNOSIS — Z20828 Contact with and (suspected) exposure to other viral communicable diseases: Secondary | ICD-10-CM | POA: Diagnosis present

## 2019-03-17 DIAGNOSIS — I629 Nontraumatic intracranial hemorrhage, unspecified: Secondary | ICD-10-CM | POA: Diagnosis not present

## 2019-03-17 DIAGNOSIS — N183 Chronic kidney disease, stage 3 (moderate): Secondary | ICD-10-CM | POA: Diagnosis present

## 2019-03-17 DIAGNOSIS — I517 Cardiomegaly: Secondary | ICD-10-CM | POA: Diagnosis not present

## 2019-03-17 DIAGNOSIS — B9620 Unspecified Escherichia coli [E. coli] as the cause of diseases classified elsewhere: Secondary | ICD-10-CM | POA: Diagnosis present

## 2019-03-17 DIAGNOSIS — R402122 Coma scale, eyes open, to pain, at arrival to emergency department: Secondary | ICD-10-CM | POA: Diagnosis present

## 2019-03-17 DIAGNOSIS — G936 Cerebral edema: Secondary | ICD-10-CM | POA: Diagnosis not present

## 2019-03-17 DIAGNOSIS — Z7401 Bed confinement status: Secondary | ICD-10-CM | POA: Diagnosis not present

## 2019-03-17 DIAGNOSIS — Z7982 Long term (current) use of aspirin: Secondary | ICD-10-CM

## 2019-03-17 DIAGNOSIS — S3993XA Unspecified injury of pelvis, initial encounter: Secondary | ICD-10-CM | POA: Diagnosis not present

## 2019-03-17 DIAGNOSIS — I6523 Occlusion and stenosis of bilateral carotid arteries: Secondary | ICD-10-CM | POA: Diagnosis not present

## 2019-03-17 DIAGNOSIS — R402212 Coma scale, best verbal response, none, at arrival to emergency department: Secondary | ICD-10-CM | POA: Diagnosis present

## 2019-03-17 DIAGNOSIS — M6281 Muscle weakness (generalized): Secondary | ICD-10-CM | POA: Diagnosis not present

## 2019-03-17 DIAGNOSIS — R000 Tachycardia, unspecified: Secondary | ICD-10-CM | POA: Diagnosis not present

## 2019-03-17 DIAGNOSIS — J9811 Atelectasis: Secondary | ICD-10-CM | POA: Diagnosis not present

## 2019-03-17 DIAGNOSIS — R5381 Other malaise: Secondary | ICD-10-CM | POA: Diagnosis not present

## 2019-03-17 DIAGNOSIS — R1311 Dysphagia, oral phase: Secondary | ICD-10-CM | POA: Diagnosis not present

## 2019-03-17 DIAGNOSIS — I639 Cerebral infarction, unspecified: Secondary | ICD-10-CM | POA: Diagnosis present

## 2019-03-17 DIAGNOSIS — J810 Acute pulmonary edema: Secondary | ICD-10-CM | POA: Diagnosis not present

## 2019-03-17 DIAGNOSIS — S06360A Traumatic hemorrhage of cerebrum, unspecified, without loss of consciousness, initial encounter: Secondary | ICD-10-CM | POA: Diagnosis not present

## 2019-03-17 DIAGNOSIS — R102 Pelvic and perineal pain: Secondary | ICD-10-CM | POA: Diagnosis not present

## 2019-03-17 DIAGNOSIS — D699 Hemorrhagic condition, unspecified: Secondary | ICD-10-CM | POA: Diagnosis not present

## 2019-03-17 LAB — CBC WITH DIFFERENTIAL/PLATELET
Abs Immature Granulocytes: 0.04 10*3/uL (ref 0.00–0.07)
Basophils Absolute: 0 10*3/uL (ref 0.0–0.1)
Basophils Relative: 0 %
Eosinophils Absolute: 0 10*3/uL (ref 0.0–0.5)
Eosinophils Relative: 0 %
HCT: 40 % (ref 36.0–46.0)
Hemoglobin: 13.3 g/dL (ref 12.0–15.0)
Immature Granulocytes: 1 %
Lymphocytes Relative: 8 %
Lymphs Abs: 0.7 10*3/uL (ref 0.7–4.0)
MCH: 32.8 pg (ref 26.0–34.0)
MCHC: 33.3 g/dL (ref 30.0–36.0)
MCV: 98.5 fL (ref 80.0–100.0)
Monocytes Absolute: 0.4 10*3/uL (ref 0.1–1.0)
Monocytes Relative: 5 %
Neutro Abs: 7.3 10*3/uL (ref 1.7–7.7)
Neutrophils Relative %: 86 %
Platelets: 122 10*3/uL — ABNORMAL LOW (ref 150–400)
RBC: 4.06 MIL/uL (ref 3.87–5.11)
RDW: 13.2 % (ref 11.5–15.5)
WBC: 8.4 10*3/uL (ref 4.0–10.5)
nRBC: 0 % (ref 0.0–0.2)

## 2019-03-17 LAB — CBG MONITORING, ED: Glucose-Capillary: 117 mg/dL — ABNORMAL HIGH (ref 70–99)

## 2019-03-17 LAB — COMPREHENSIVE METABOLIC PANEL
ALT: 17 U/L (ref 0–44)
AST: 34 U/L (ref 15–41)
Albumin: 3.5 g/dL (ref 3.5–5.0)
Alkaline Phosphatase: 60 U/L (ref 38–126)
Anion gap: 11 (ref 5–15)
BUN: 27 mg/dL — ABNORMAL HIGH (ref 8–23)
CO2: 19 mmol/L — ABNORMAL LOW (ref 22–32)
Calcium: 8.8 mg/dL — ABNORMAL LOW (ref 8.9–10.3)
Chloride: 113 mmol/L — ABNORMAL HIGH (ref 98–111)
Creatinine, Ser: 1.17 mg/dL — ABNORMAL HIGH (ref 0.44–1.00)
GFR calc Af Amer: 48 mL/min — ABNORMAL LOW (ref >60)
GFR calc non Af Amer: 42 mL/min — ABNORMAL LOW (ref >60)
Glucose, Bld: 139 mg/dL — ABNORMAL HIGH (ref 70–99)
Potassium: 3.2 mmol/L — ABNORMAL LOW (ref 3.5–5.1)
Sodium: 143 mmol/L (ref 135–145)
Total Bilirubin: 2.4 mg/dL — ABNORMAL HIGH (ref 0.3–1.2)
Total Protein: 6.7 g/dL (ref 6.5–8.1)

## 2019-03-17 LAB — CK: Total CK: 629 U/L — ABNORMAL HIGH (ref 38–234)

## 2019-03-17 LAB — PROTIME-INR
INR: 1.3 — ABNORMAL HIGH (ref 0.8–1.2)
Prothrombin Time: 15.6 seconds — ABNORMAL HIGH (ref 11.4–15.2)

## 2019-03-17 LAB — LACTIC ACID, PLASMA: Lactic Acid, Venous: 2.5 mmol/L (ref 0.5–1.9)

## 2019-03-17 LAB — MAGNESIUM: Magnesium: 1.7 mg/dL (ref 1.7–2.4)

## 2019-03-17 LAB — SARS CORONAVIRUS 2 BY RT PCR (HOSPITAL ORDER, PERFORMED IN ~~LOC~~ HOSPITAL LAB): SARS Coronavirus 2: NEGATIVE

## 2019-03-17 LAB — APTT: aPTT: 28 seconds (ref 24–36)

## 2019-03-17 LAB — ETHANOL: Alcohol, Ethyl (B): <10 mg/dL (ref <10)

## 2019-03-17 MED ORDER — SODIUM CHLORIDE 0.9 % IV BOLUS
1000.0000 mL | Freq: Once | INTRAVENOUS | Status: AC
  Administered 2019-03-17: 1000 mL via INTRAVENOUS

## 2019-03-17 MED ORDER — CLEVIDIPINE BUTYRATE 0.5 MG/ML IV EMUL
0.0000 mg/h | INTRAVENOUS | Status: DC
  Administered 2019-03-17: 4 mg/h via INTRAVENOUS
  Administered 2019-03-17: 1 mg/h via INTRAVENOUS
  Administered 2019-03-18: 4 mg/h via INTRAVENOUS
  Administered 2019-03-18: 5 mg/h via INTRAVENOUS
  Administered 2019-03-18 – 2019-03-19 (×3): 6 mg/h via INTRAVENOUS
  Administered 2019-03-20: 1 mg/h via INTRAVENOUS
  Filled 2019-03-17 (×8): qty 50

## 2019-03-17 MED ORDER — CHLORHEXIDINE GLUCONATE CLOTH 2 % EX PADS
6.0000 | MEDICATED_PAD | Freq: Every day | CUTANEOUS | Status: DC
  Administered 2019-03-17: 23:00:00 6 via TOPICAL

## 2019-03-17 MED ORDER — ACETAMINOPHEN 650 MG RE SUPP: 650.0000 mg | RECTAL | Status: DC | PRN

## 2019-03-17 MED ORDER — PANTOPRAZOLE SODIUM 40 MG IV SOLR
40.0000 mg | Freq: Every day | INTRAVENOUS | Status: DC
  Administered 2019-03-17 – 2019-03-19 (×3): 40 mg via INTRAVENOUS
  Filled 2019-03-17 (×3): qty 40

## 2019-03-17 MED ORDER — ACETAMINOPHEN 160 MG/5ML PO SOLN
650.0000 mg | ORAL | Status: DC | PRN
  Administered 2019-03-20 – 2019-03-23 (×5): 650 mg
  Filled 2019-03-17 (×5): qty 20.3

## 2019-03-17 MED ORDER — LABETALOL HCL 5 MG/ML IV SOLN
20.0000 mg | Freq: Once | INTRAVENOUS | Status: AC
  Administered 2019-03-17: 20:00:00 20 mg via INTRAVENOUS
  Filled 2019-03-17: qty 4

## 2019-03-17 MED ORDER — ACETAMINOPHEN 325 MG PO TABS
650.0000 mg | ORAL_TABLET | ORAL | Status: DC | PRN
  Administered 2019-03-23: 650 mg via ORAL
  Filled 2019-03-17: qty 2

## 2019-03-17 MED ORDER — SENNOSIDES-DOCUSATE SODIUM 8.6-50 MG PO TABS
1.0000 | ORAL_TABLET | Freq: Two times a day (BID) | ORAL | Status: DC
  Administered 2019-03-20: 1 via ORAL
  Filled 2019-03-17: qty 1

## 2019-03-17 MED ORDER — STROKE: EARLY STAGES OF RECOVERY BOOK
Freq: Once | Status: AC
  Administered 2019-03-17: 23:00:00
  Filled 2019-03-17: qty 1

## 2019-03-17 NOTE — H&P (Signed)
Neurology H&P  CC: Found down  History is obtained from: Patient  HPI: Jane [name] is a 85 y.o. female who was found down by her maintenance man today.  She was last seen normal on Monday.  At baseline, she is able to manage by herself and lives somewhat independently day-to-day, but needs significant help with paying bills, grocery shopping, or other outside of the house activities.  She was brought to the emergency department where head CT was performed which shows significant intraventricular extension of a small periventricular hemorrhage.  She was severely hypertensive on arrival,  LKW: Monday tpa given?: no, ich ICH Score: 2 Modified Rankin Scale: 3-Moderate disability-requires help but walks WITHOUT assistance   ROS:  Unable to obtain due to altered mental status.   Past Medical History:  Diagnosis Date  . Anemia   . Aortic insufficiency    mild  . Arthritis    "arms" (08/09/2015)  . CAD (coronary artery disease)    cath 08/09/2014 95% stenosis in prox to mid RCA s/p DES, 80-90% prox OM2, 50% distal LAD  . CHF (congestive heart failure) ([place])   . CKD (chronic kidney disease) stage 3, GFR 30-59 ml/min ([facility])   . Colon polyp    2009 colonoscopy, not retrieved for pathology  . Dyspnea   . Ectropion of left lower eyelid   . GERD (gastroesophageal reflux disease) 2009   EGD with benign gastric polyp too  . Hyperlipidemia   . Hypertension   . Pneumonia    history of  . Stroke ([place]) 1975  . Vitamin D deficiency      Family History  Problem Relation Age of Onset  . Stroke Mother   . Hypertension Mother   . Stroke Father   . Hypertension Father   . Diabetes Sister      Social History:  reports that she quit smoking about 39 years ago. Her smoking use included cigarettes. She has quit using smokeless tobacco. She reports that she does not drink alcohol or use drugs.   Exam: Current vital signs: BP (!) 151/62   Pulse 98   Temp 97.9 F (36.6 C) (Temporal)    Resp 20   SpO2 99%  Vital signs in last 24 hours: Temp:  [97.9 F (36.6 C)] 97.9 F (36.6 C) (09/16 1756) Pulse Rate:  [85-127] 98 (09/16 2145) Resp:  [15-26] 20 (09/16 2145) BP: (123-234)/(59-149) 151/62 (09/16 2145) SpO2:  [97 %-100 %] 99 % (09/16 2145)  Physical Exam  Constitutional: Appears well-developed and well-nourished.  Psych: Does not answer questions Eyes: No scleral injection HENT: No OP obstrucion Head: Normocephalic.  Cardiovascular: Normal rate and regular rhythm.  Respiratory: Effort normal and breath sounds normal to anterior ascultation GI: Soft.  No distension. There is no tenderness.  Skin: WDI  Neuro: Mental Status: Patient is awake, her speech is garbled and I cannot understand her, but she does not follow commands reliably. Cranial Nerves: II: She does not blink to threat from either direction pupils are equal, round, and reactive to light.   III,IV, VI: She has a left gaze deviation  V: Facial sensation is symmetric to temperature VII: Facial movement with mild flattening of the right nasolabial fold Motor: Moves all of her extremities relatively symmetrically,?  Less movement on the right than left but this is not clear  sensory: She responds to pain in all 4 extremities  Cerebellar: Does not perform   I have reviewed labs in epic and the results pertinent  to this consultation are: INR 1.3 Mild hypokalemia at 3.2 Mildly elevated bilirubin with otherwise normal LFTs  I have reviewed the images obtained: CT head- periventricular hemorrhage with intraventricular extension  Primary Diagnosis:  Intracerebral hemorrhage   Secondary Diagnosis: Hypertension Emergency (SBP > 180 or DBP > 120 & end organ damage), CKD Stage 3 (GFR 30-59) and Hypokalemia   Impression: 85 year old female with periventricular hemorrhage which I suspect is hypertensive in etiology with intraventricular extension.  Given her age, I did have a CODE STATUS discussion with  her nephew and for the time being she does remain full code given that he had not had significant discussions with her in the past.  Neurosurgery has evaluated and does not feel that EVD is warranted currently.  She is being admitted to the ICU for blood pressure control and frequent neuro checks.  Recommendations: 1) Admit to ICU 2) no antiplatelets or anticoagulants 3) blood pressure control with goal systolic [id number] 4) Frequent neuro checks 5) If symptoms worsen or there is decreased mental status, repeat stat head CT 6) PT,OT,ST   This patient is critically ill and at significant risk of neurological worsening, death and care requires constant monitoring of vital signs, hemodynamics,respiratory and cardiac monitoring, neurological assessment, discussion with family, other specialists and medical decision making of high complexity. I spent 50 minutes of neurocritical care time  in the care of  this patient. This was time spent independent of any time provided by nurse practitioner or PA.  Roland Rack, MD [facility] [phone]  If 7pm- 7am, please page neurology on call as listed in [place]. 03/17/2019  10:36 PM

## 2019-03-17 NOTE — ED Provider Notes (Signed)
[facility] Provider Note   CSN: [id number] Arrival date & time: 03/17/19  1746     History   Chief Complaint Chief Complaint  Patient presents with   Altered Mental Status   Hypertension    HPI Jane [name] is a 85 y.o. female with a past medical history of stroke, hypertension, hyperlipidemia, CHF with LVEF 55 to 60%, who presents today after being found on the ground by maintenance.  She was reportedly last seen by her family on Monday, 2 days ago.  Patient is initially nonverbal and unable to contribute to history.       HPI  Past Medical History:  Diagnosis Date   Anemia    Aortic insufficiency    mild   Arthritis    "arms" (08/09/2015)   CAD (coronary artery disease)    cath 08/09/2014 95% stenosis in prox to mid RCA s/p DES, 80-90% prox OM2, 50% distal LAD   CHF (congestive heart failure) (HCC)    CKD (chronic kidney disease) stage 3, GFR 30-59 ml/min (HCC)    Colon polyp    2009 colonoscopy, not retrieved for pathology   Dyspnea    Ectropion of left lower eyelid    GERD (gastroesophageal reflux disease) 2009   EGD with benign gastric polyp too   Hyperlipidemia    Hypertension    Pneumonia    history of   Stroke ([place]) 1975   Vitamin D deficiency     Patient Active Problem List   Diagnosis Date Noted   Stroke (cerebrum) ([place]) 03/17/2019   Urge incontinence 11/18/2018   Acute exacerbation of CHF (congestive heart failure) ([place]) 10/20/2018   Acute on chronic heart failure with preserved ejection fraction (HFpEF) ([place]) 07/25/2018   Hypokalemia 07/25/2018   Cellulitis of left lower extremity    Toenail deformity 03/19/2018   Skin lesion of scalp 03/19/2018   CAD (coronary artery disease) 08/25/2017   Unsteady gait 08/11/2016   Abdominal aortic aneurysm ([place]) 05/21/2016   Trochanteric bursitis of left hip    Pancytopenia ([place]) 12/27/2015   CKD (chronic kidney disease) 03/31/2015    Prediabetes 10/16/2014   Status post insertion of drug-eluting stent into right coronary artery for coronary artery disease 08/07/2014   SOB (shortness of breath) 05/31/2014   Physical deconditioning 05/31/2014   Heart failure with preserved ejection fraction ([place]) 12/27/2012   Ectropion of left lower eyelid 12/17/2012   Onychomycosis of toenail 09/18/2012   PAD (peripheral artery disease) ([place]) 09/18/2012   Preventative health care 09/18/2012   Vitamin D deficiency 04/25/2008   History of cerebrovascular accident 10/29/2007   Hyperlipidemia 09/24/2006   Normocytic anemia 09/24/2006   Hypertension 09/24/2006   OSTEOPENIA 09/24/2006   Vitamin B12 deficiency 08/29/2006    Past Surgical History:  Procedure Laterality Date   ABDOMINAL HYSTERECTOMY     APPENDECTOMY     ARTERY BIOPSY Left 12/16/2012   Procedure: BIOPSY TEMPORAL ARTERY;  Surgeon: Mal Misty, MD;  Location: [facility];  Service: Vascular;  Laterality: Left;   CARDIAC CATHETERIZATION     LEFT HEART CATHETERIZATION WITH CORONARY ANGIOGRAM N/A 08/09/2014   Procedure: LEFT HEART CATHETERIZATION WITH CORONARY ANGIOGRAM;  Surgeon: Peter M Martinique, MD;  Location: [facility] CATH LAB;  Service: Cardiovascular;  Laterality: N/A;   PERCUTANEOUS CORONARY STENT INTERVENTION (PCI-S)  08/09/2014   Procedure: PERCUTANEOUS CORONARY STENT INTERVENTION (PCI-S);  Surgeon: Peter M Martinique, MD;  Location: [facility] CATH LAB;  Service: Cardiovascular;;   TONSILLECTOMY  OB History   No obstetric history on file.      Home Medications    Prior to Admission medications   Medication Sig Start Date End Date Taking? Authorizing Provider  acetaminophen (TYLENOL) 325 MG tablet Take 325-650 mg by mouth every 6 (six) hours as needed for mild pain or headache.    Yes [provider]  aspirin EC 81 MG tablet Take 1 tablet (81 mg total) by mouth daily. 09/18/18  Yes Lassen, Arlo C, PA-C  albuterol (PROVENTIL HFA;VENTOLIN HFA) 108 (90  Base) MCG/ACT inhaler Inhale 1-2 puffs into the lungs every 6 (six) hours as needed for wheezing or shortness of breath. 09/18/18   [place]an[place] Lewis C, PA-C  atorvastatin (LIPITOR) 40 MG tablet Take 1 tablet (40 mg total) by mouth daily at 6 PM. 09/18/18   Oscar La, Arlo C, PA-C  bisoprolol (ZEBETA) 5 MG tablet TAKE 1 TABLET(5 MG) BY MOUTH DAILY FOR HIGH BLOOD PRESSURE Patient taking differently: Take 5 mg by mouth daily.  11/18/18   Asencion Noble, MD  furosemide (LASIX) 20 MG tablet TAKE 1 TABLET(20 MG) BY MOUTH DAILY Patient taking differently: Take 20 mg by mouth daily.  11/18/18   Asencion Noble, MD  isosorbide mononitrate (IMDUR) 30 MG 24 hr tablet Take 30 mg by mouth daily. 12/19/18   [provider]  losartan (COZAAR) 100 MG tablet TAKE 1 TABLET(100 MG) BY MOUTH DAILY Patient taking differently: Take 100 mg by mouth daily.  11/18/18   Asencion Noble, MD  magnesium hydroxide (MILK OF MAGNESIA) 400 MG/5ML suspension Take 30 mLs by mouth daily as needed for mild constipation.     [provider]  polyvinyl alcohol (ARTIFICIAL TEARS) 1.4 % ophthalmic solution Place 1 drop into both eyes 2 (two) times daily. For dry eyes Patient taking differently: Place 1 drop into both eyes 2 (two) times daily.  09/18/18   [place]an[place] Lewis C, PA-C  senna-docusate (SENNA PLUS) 8.6-50 MG tablet Take 2 tablets by mouth at bedtime. SENNA PLUS TABLET TAKE 2 TABLETS BY MOUTH AT BEDTIME FOR CONSTIPATION Patient taking differently: Take 2 tablets by mouth at bedtime.  09/18/18   Wille Celeste, PA-C    Family History Family History  Problem Relation Age of Onset   Stroke Mother    Hypertension Mother    Stroke Father    Hypertension Father    Diabetes Sister     Social History Social History   Tobacco Use   Smoking status: Former Smoker    Types: Cigarettes    Quit date: 07/02/1979    Years since quitting: 39.7   Smokeless tobacco: Former Systems developer   Tobacco comment: Started in teenage  years - Quit 1980  Substance Use Topics   Alcohol use: No    Alcohol/week: 0.0 standard drinks    Comment: Quit alcohol 1975-76   Drug use: No     Allergies   Patient has no known allergies.   Review of Systems Review of Systems  Unable to perform ROS: Mental status change     Physical Exam Updated Vital Signs BP 133/76 (BP Location: Left Arm)    Pulse (!) 118    Temp 98.5 F (36.9 C) (Oral)    Resp 20    SpO2 97%   Physical Exam Vitals signs and nursing note reviewed.  Constitutional:      Appearance: She is well-developed. She is not diaphoretic.  HENT:     Head: Normocephalic.     Comments:  There is diffuse ecchymosis on the right side of the head.  No raccoon signs or battle signs.    Mouth/Throat:     Mouth: Mucous membranes are moist.  Eyes:     General: No scleral icterus.       Right eye: Discharge present.        Left eye: Discharge present.    Conjunctiva/sclera: Conjunctivae normal.     Pupils: Pupils are equal, round, and reactive to light.     Comments: Patient opens left eye to painful stimulation.   Neck:     Comments: C-collar in place on arrival, ROM not tested. Cardiovascular:     Rate and Rhythm: Regular rhythm. Tachycardia present.     Heart sounds: Normal heart sounds.  Pulmonary:     Effort: Pulmonary effort is normal. No respiratory distress.     Breath sounds: Normal breath sounds. No stridor.  Abdominal:     General: There is no distension.     Tenderness: There is no abdominal tenderness.  Musculoskeletal:        General: No deformity.     Right lower leg: No edema.     Left lower leg: No edema.     Comments: Bilateral upper and lower extremities palpated without obvious crepitus or deformities.  Compartments are soft and easily compressible.  There is ecchymosis with superficial skin sloughing on the right lateral shoulder, ecchymosis on the right forearm.  Skin:    General: Skin is warm and dry.     Comments: She has ecchymosis  over the right lateral upper arm/shoulder with skin breakdown partially through the dermis.  Neurological:     GCS: GCS eye subscore is 2. GCS verbal subscore is 1. GCS motor subscore is 5.     Motor: No abnormal muscle tone.  Psychiatric:     Comments: Unable to assess secondary to mental status change      ED Treatments / Results  Labs (all labs ordered are listed, but only abnormal results are displayed) Labs Reviewed  COMPREHENSIVE METABOLIC PANEL - Abnormal; Notable for the following components:      Result Value   Potassium 3.2 (*)    Chloride 113 (*)    CO2 19 (*)    Glucose, Bld 139 (*)    BUN 27 (*)    Creatinine, Ser 1.17 (*)    Calcium 8.8 (*)    Total Bilirubin 2.4 (*)    GFR calc non Af Amer 42 (*)    GFR calc Af Amer 48 (*)    All other components within normal limits  CBC WITH DIFFERENTIAL/PLATELET - Abnormal; Notable for the following components:   Platelets 122 (*)    All other components within normal limits  CK - Abnormal; Notable for the following components:   Total CK 629 (*)    All other components within normal limits  LACTIC ACID, PLASMA - Abnormal; Notable for the following components:   Lactic Acid, Venous 2.5 (*)    All other components within normal limits  PROTIME-INR - Abnormal; Notable for the following components:   Prothrombin Time 15.6 (*)    INR 1.3 (*)    All other components within normal limits  CBG MONITORING, ED - Abnormal; Notable for the following components:   Glucose-Capillary 117 (*)    All other components within normal limits  SARS CORONAVIRUS 2 (HOSPITAL ORDER, [facility])  URINE CULTURE  CULTURE, BLOOD (ROUTINE X 2)  CULTURE,  BLOOD (ROUTINE X 2)  MRSA PCR SCREENING  ETHANOL  APTT  MAGNESIUM  URINALYSIS, ROUTINE W REFLEX MICROSCOPIC  LACTIC ACID, PLASMA  RAPID URINE DRUG SCREEN, HOSP PERFORMED    EKG None  Radiology Ct Head Wo Contrast  Addendum Date: 03/17/2019   ADDENDUM REPORT:  03/17/2019 22:19 ADDENDUM: Cervical spine: Comparison 10/16/2018 Alignment: There is loss of cervical lordosis. This may be secondary to splinting, soft tissue injury, or positioning. There is stable anterolisthesis of C2 on C3, C3 on C4, and C4 on C5. Skull base and vertebrae: No acute fracture. Disc spaces: Significant degenerative changes are identified throughout the cervical spine. Soft tissues and spinal canal: No acute abnormality. UPPER chest: No acute abnormality. Other: Carotid calcifications bilaterally. IMPRESSION: Significant stable degenerative changes throughout the cervical spine. No evidence for acute abnormality. Electronically Signed   By: Nolon Nations M.D.   On: 03/17/2019 22:19   Result Date: 03/17/2019 CLINICAL DATA:  Pt found by the maintenance man. Last seen by family on Monday. Pt altered level of consciousness. Hypertension and strong smell of amnionia EXAM: CT HEAD WITHOUT CONTRAST; CT CERVICAL SPINE WITHOUT CONTRAST TECHNIQUE: Contiguous axial images were obtained from the base of the skull through the vertex without intravenous contrast. COMPARISON:  07/01/2018 FINDINGS: Brain: Significant blood identified within the LATERAL ventricles, LEFT greater than RIGHT. Blood is also identified in the third and fourth ventricles, layering dependently. There is parenchymal hemorrhage within the frontal lobe periventricular white matter, adjacent to the body of the LEFT LATERAL ventricle and contiguous with the intraventricular blood. This focus of hemorrhage is estimated to measure 9 x 9 millimeters. There is central and cortical atrophy. Periventricular white matter changes are consistent with small vessel disease. Vascular: There is atherosclerotic calcification of the internal carotid arteries. No hyperdense vessels. Skull: Normal. Negative for fracture or focal lesion. Sinuses/Orbits: No acute finding. Other: 11 significant scalp1 edema and hematoma along the vertex, not associated with  underlying fracture. IMPRESSION: 1. Large intraventricular hemorrhage with ventricular dilatation. 2. 9 millimeter intraparenchymal hemorrhage within the frontal lobe periventricular white matter, adjacent to the body of the LEFT LATERAL ventricle. 3. Atrophy and small vessel disease. 4. Significant scalp edema and hematoma along the vertex, not associated with underlying fracture. Critical Value/emergent results were called by telephone at the time of interpretation on 03/17/2019 at 7:00 pm to [facility] , who verbally acknowledged these results. Electronically Signed: By: Nolon Nations M.D. On: 03/17/2019 19:06   Ct Cervical Spine Wo Contrast  Addendum Date: 03/17/2019   ADDENDUM REPORT: 03/17/2019 22:19 ADDENDUM: Cervical spine: Comparison 10/16/2018 Alignment: There is loss of cervical lordosis. This may be secondary to splinting, soft tissue injury, or positioning. There is stable anterolisthesis of C2 on C3, C3 on C4, and C4 on C5. Skull base and vertebrae: No acute fracture. Disc spaces: Significant degenerative changes are identified throughout the cervical spine. Soft tissues and spinal canal: No acute abnormality. UPPER chest: No acute abnormality. Other: Carotid calcifications bilaterally. IMPRESSION: Significant stable degenerative changes throughout the cervical spine. No evidence for acute abnormality. Electronically Signed   By: Nolon Nations M.D.   On: 03/17/2019 22:19   Result Date: 03/17/2019 CLINICAL DATA:  Pt found by the maintenance man. Last seen by family on Monday. Pt altered level of consciousness. Hypertension and strong smell of amnionia EXAM: CT HEAD WITHOUT CONTRAST; CT CERVICAL SPINE WITHOUT CONTRAST TECHNIQUE: Contiguous axial images were obtained from the base of the skull through the vertex without intravenous contrast. COMPARISON:  07/01/2018 FINDINGS: Brain: Significant blood identified within the LATERAL ventricles, LEFT greater than RIGHT. Blood is also  identified in the third and fourth ventricles, layering dependently. There is parenchymal hemorrhage within the frontal lobe periventricular white matter, adjacent to the body of the LEFT LATERAL ventricle and contiguous with the intraventricular blood. This focus of hemorrhage is estimated to measure 9 x 9 millimeters. There is central and cortical atrophy. Periventricular white matter changes are consistent with small vessel disease. Vascular: There is atherosclerotic calcification of the internal carotid arteries. No hyperdense vessels. Skull: Normal. Negative for fracture or focal lesion. Sinuses/Orbits: No acute finding. Other: 11 significant scalp1 edema and hematoma along the vertex, not associated with underlying fracture. IMPRESSION: 1. Large intraventricular hemorrhage with ventricular dilatation. 2. 9 millimeter intraparenchymal hemorrhage within the frontal lobe periventricular white matter, adjacent to the body of the LEFT LATERAL ventricle. 3. Atrophy and small vessel disease. 4. Significant scalp edema and hematoma along the vertex, not associated with underlying fracture. Critical Value/emergent results were called by telephone at the time of interpretation on 03/17/2019 at 7:00 pm to [facility] , who verbally acknowledged these results. Electronically Signed: By: Nolon Nations M.D. On: 03/17/2019 19:06   Dg Pelvis Portable  Result Date: 03/17/2019 CLINICAL DATA:  Recent fall with pelvic pain, initial encounter EXAM: PORTABLE PELVIS 1-2 VIEWS COMPARISON:  None. FINDINGS: Pelvic ring is intact. Degenerative changes of the lumbar spine and hip joints are seen. Diffuse vascular calcifications are noted. No acute bony abnormality is noted. IMPRESSION: No acute abnormality noted. Electronically Signed   By: Inez Catalina M.D.   On: 03/17/2019 19:09   Dg Chest Port 1 View  Result Date: 03/17/2019 CLINICAL DATA:  Altered level of consciousness EXAM: PORTABLE CHEST 1 VIEW COMPARISON:   Radiograph 10/20/2018, CT 09/17/2016 FINDINGS: Cardiomegaly and central venous congestion, similar to prior. Suspect mild interstitial edema as well with hazy interstitial opacities and indistinct cephalized vascularity. No focal consolidative opacity. No pneumothorax or effusion. The aorta is calcified. The remaining cardiomediastinal contours are unremarkable. Degenerative changes are present in the spine and shoulders including a high-riding appearance of the right humeral head likely reflecting chronic rotator cuff insufficiency. IMPRESSION: Cardiomegaly with mild interstitial edema. Electronically Signed   By: Lovena Le M.D.   On: 03/17/2019 19:08    Procedures .Critical Care Performed by: Lorin Glass, PA-C Authorized by: Lorin Glass, PA-C   Critical care provider statement:    Critical care time (minutes):  45   Critical care was time spent personally by me on the following activities:  Discussions with consultants, evaluation of patient's response to treatment, examination of patient, ordering and performing treatments and interventions, ordering and review of laboratory studies, ordering and review of radiographic studies, pulse oximetry, re-evaluation of patient's condition, obtaining history from patient or surrogate and review of old charts   (including critical care time)  Medications Ordered in ED Medications  labetalol (NORMODYNE) injection 20 mg (20 mg Intravenous Given 03/17/19 1949)    And  clevidipine (CLEVIPREX) infusion 0.5 mg/mL (4 mg/hr Intravenous Rate/Dose Change 03/17/19 2021)  sodium chloride 0.9 % bolus 1,000 mL (1,000 mLs Intravenous New Bag/Given 03/17/19 2005)     Initial Impression / Assessment and Plan / ED Course  I have reviewed the triage vital signs and the nursing notes.  Pertinent labs & imaging results that were available during my care of the patient were reviewed by me and considered in my medical decision making (see chart for  details).  Clinical Course as of Mar 16 2336  Wed Mar 17, 2019  1951 I attempted to call patient's son listed in her contacts and he reached patient's nephew Mr. McAdoo He reports that patient's son is deceased. I updated him on patient's condition in the consultation room.  He states that patient would want every thing done as only god can decide when its her time.     [EH]  2035 Spoke with Dr. Leonel Ramsay.  He reports he is at [place] long however once he returns he will be by to see the patient for admission.   [EH]  2203 On review of images, it does not appear that radiology commented on her C-spine.  Will contact radiology.  CT Cervical Spine Wo Contrast [EH]    Clinical Course User Index [EH] Lorin Glass, PA-C      Patient presents today for evaluation of altered mental status.  She was last seen by family 2 days ago and then was found today on the ground by maintenance.  On arrival she is tachycardic and hypertensive with a GCS of 8.  She was given a 250 cc normal saline bolus after which her GCS improved from 2/1/5 up to 3/4/6.   CT head was obtained showing a large intraventricular hemorrhage with ventricular dilation on the left side with intraparenchymal hemorrhage of the brain.  CT C-spine was obtained without evidence of acute fracture or other abnormality.  She is hypertensive on arrival, she is given IV labetalol and Cleviprex for her hypertension with adequate reduction in her blood pressure.  INR is elevated at 3.1.  Her CK is elevated at 629.  COVID testing was negative.  BMP obtained showing a potassium of 3.2, creatinine 1.17 with a GFR of 42.  Her lactic acid is elevated at 2.5, I suspect that this is secondary to dehydration, and her fall.  Neurosurgery was consulted who recommended neurology involvement.  I spoke with Dr. Leonel Ramsay from neurology who will admit patient.  Patient was seen as a shared visit with Dr. Sedonia Small.    I spoke with patient's nephew who  reports that he is the primary person who assists her in medical matters who states that he feels she would want "everything done."  Patient will be admitted.   Final Clinical Impressions(s) / ED Diagnoses   Final diagnoses:  Intraparenchymal hemorrhage of brain ([place])  Altered mental status, unspecified altered mental status type  Traumatic rhabdomyolysis, initial encounter [facility])    ED Discharge Orders    None       Ollen Gross 03/17/19 2337    Maudie Flakes, MD 03/20/19 [phone]

## 2019-03-17 NOTE — ED Notes (Signed)
Patient transported to CT 

## 2019-03-17 NOTE — ED Triage Notes (Signed)
Pt found by the maintance man last seen by family on Monday pt aloc with , htn and strong smell on amnionia

## 2019-03-17 NOTE — ED Notes (Signed)
[name] PA at bedside.

## 2019-03-17 NOTE — Consult Note (Signed)
Chief Complaint   Chief Complaint  Patient presents with  . Altered Mental Status  . Hypertension    HPI   Consult requested by: Wyn Quaker, PA-C Reason for consult: IVH  HPI: Jane [name] is a 85 y.o. female with history of CHF, CAD, CKD, PAD, HTN and CVA who was brought to ED after being found down unresponsive by maintenance. Patient is altered and unable to provide any history. History obtained via discussion with ED personnel and chart review. By report, family has not seen patient since Monday. Work up performed by EDP included head CT which was significant for parenchymal hemorrhage with intraventricular extension. NSY consultation requested. Patient with incomprehensible speech, not following commands. Hypertensive upon arrival, max BP 234/127, now normotensive on cleviprex.  Patient Active Problem List   Diagnosis Date Noted  . Urge incontinence 11/18/2018  . Acute exacerbation of CHF (congestive heart failure) ([place]) 10/20/2018  . Acute on chronic heart failure with preserved ejection fraction (HFpEF) ([place]) 07/25/2018  . Hypokalemia 07/25/2018  . Cellulitis of left lower extremity   . Toenail deformity 03/19/2018  . Skin lesion of scalp 03/19/2018  . CAD (coronary artery disease) 08/25/2017  . Unsteady gait 08/11/2016  . Abdominal aortic aneurysm ([place]) 05/21/2016  . Trochanteric bursitis of left hip   . Pancytopenia ([place]) 12/27/2015  . CKD (chronic kidney disease) 03/31/2015  . Prediabetes 10/16/2014  . Status post insertion of drug-eluting stent into right coronary artery for coronary artery disease 08/07/2014  . SOB (shortness of breath) 05/31/2014  . Physical deconditioning 05/31/2014  . Heart failure with preserved ejection fraction ([place]) 12/27/2012  . Ectropion of left lower eyelid 12/17/2012  . Onychomycosis of toenail 09/18/2012  . PAD (peripheral artery disease) ([place]) 09/18/2012  . Preventative health care 09/18/2012  . Vitamin D deficiency  04/25/2008  . History of cerebrovascular accident 10/29/2007  . Hyperlipidemia 09/24/2006  . Normocytic anemia 09/24/2006  . Hypertension 09/24/2006  . OSTEOPENIA 09/24/2006  . Vitamin B12 deficiency 08/29/2006    PMH: Past Medical History:  Diagnosis Date  . Anemia   . Aortic insufficiency    mild  . Arthritis    "arms" (08/09/2015)  . CAD (coronary artery disease)    cath 08/09/2014 95% stenosis in prox to mid RCA s/p DES, 80-90% prox OM2, 50% distal LAD  . CHF (congestive heart failure) ([place])   . CKD (chronic kidney disease) stage 3, GFR 30-59 ml/min (HCC)   . Colon polyp    2009 colonoscopy, not retrieved for pathology  . Dyspnea   . Ectropion of left lower eyelid   . GERD (gastroesophageal reflux disease) 2009   EGD with benign gastric polyp too  . Hyperlipidemia   . Hypertension   . Pneumonia    history of  . Stroke ([place]) 1975  . Vitamin D deficiency     PSH: Past Surgical History:  Procedure Laterality Date  . ABDOMINAL HYSTERECTOMY    . APPENDECTOMY    . ARTERY BIOPSY Left 12/16/2012   Procedure: BIOPSY TEMPORAL ARTERY;  Surgeon: Mal Misty, MD;  Location: [facility];  Service: Vascular;  Laterality: Left;  . CARDIAC CATHETERIZATION    . LEFT HEART CATHETERIZATION WITH CORONARY ANGIOGRAM N/A 08/09/2014   Procedure: LEFT HEART CATHETERIZATION WITH CORONARY ANGIOGRAM;  Surgeon: Peter M Martinique, MD;  Location[facility] LAB;  Service: Cardiovascular;  Laterality: N/A;  . PERCUTANEOUS CORONARY STENT INTERVENTION (PCI-S)  08/09/2014   Procedure: PERCUTANEOUS CORONARY STENT INTERVENTION (PCI-S);  Surgeon: Ander Slade  Martinique, MD;  Location: [facility] CATH LAB;  Service: Cardiovascular;;  . TONSILLECTOMY      (Not in a hospital admission)   SH: Social History   Tobacco Use  . Smoking status: Former Smoker    Types: Cigarettes    Quit date: 07/02/1979    Years since quitting: 39.7  . Smokeless tobacco: Former Systems developer  . Tobacco comment: Started in teenage years - Quit 1980  Substance  Use Topics  . Alcohol use: No    Alcohol/week: 0.0 standard drinks    Comment: Quit alcohol 1975-76  . Drug use: No    MEDS: Prior to Admission medications   Medication Sig Start Date End Date Taking? Authorizing Provider  aspirin EC 81 MG tablet Take 1 tablet (81 mg total) by mouth daily. 09/18/18  Yes Oscar La, Arlo C, PA-C  acetaminophen (TYLENOL) 325 MG tablet Take 325-650 mg by mouth every 6 (six) hours as needed for mild pain or headache.     [provider]  albuterol (PROVENTIL HFA;VENTOLIN HFA) 108 (90 Base) MCG/ACT inhaler Inhale 1-2 puffs into the lungs every 6 (six) hours as needed for wheezing or shortness of breath. 09/18/18   Granville Lewis C, PA-C  atorvastatin (LIPITOR) 40 MG tablet Take 1 tablet (40 mg total) by mouth daily at 6 PM. 09/18/18   Oscar La, Arlo C, PA-C  bisoprolol (ZEBETA) 5 MG tablet TAKE 1 TABLET(5 MG) BY MOUTH DAILY FOR HIGH BLOOD PRESSURE Patient taking differently: Take 5 mg by mouth daily.  11/18/18   Asencion Noble, MD  furosemide (LASIX) 20 MG tablet TAKE 1 TABLET(20 MG) BY MOUTH DAILY Patient taking differently: Take 20 mg by mouth daily.  11/18/18   Asencion Noble, MD  losartan (COZAAR) 100 MG tablet TAKE 1 TABLET(100 MG) BY MOUTH DAILY Patient taking differently: Take 100 mg by mouth daily.  11/18/18   Asencion Noble, MD  magnesium hydroxide (MILK OF MAGNESIA) 400 MG/5ML suspension Take 30 mLs by mouth daily as needed for mild constipation.     [provider]  polyvinyl alcohol (ARTIFICIAL TEARS) 1.4 % ophthalmic solution Place 1 drop into both eyes 2 (two) times daily. For dry eyes 09/18/18   Granville Lewis C, PA-C  senna-docusate (SENNA PLUS) 8.6-50 MG tablet Take 2 tablets by mouth at bedtime. SENNA PLUS TABLET TAKE 2 TABLETS BY MOUTH AT BEDTIME FOR CONSTIPATION Patient taking differently: Take 2 tablets by mouth at bedtime.  09/18/18   Wille Celeste, PA-C    ALLERGY: No Known Allergies  Social History   Tobacco Use  .  Smoking status: Former Smoker    Types: Cigarettes    Quit date: 07/02/1979    Years since quitting: 39.7  . Smokeless tobacco: Former Systems developer  . Tobacco comment: Started in teenage years - Quit 1980  Substance Use Topics  . Alcohol use: No    Alcohol/week: 0.0 standard drinks    Comment: Quit alcohol 1975-76     Family History  Problem Relation Age of Onset  . Stroke Mother   . Hypertension Mother   . Stroke Father   . Hypertension Father   . Diabetes Sister      ROS   Review of Systems  Unable to perform ROS: Mental status change    Exam   Vitals:   03/17/19 1915 03/17/19 1945  BP: (!) 234/127 (!) 212/103  Pulse:  (!) 110  Resp: 18 17  Temp:    SpO2:  99%   elderly female,  in c collar, ectropion L>R  Pressure sore/bruising right shoulder, head  GCS: 9 E3V2M4 Pupils 52mm b/l sluggishly reactive Left lateral gaze preference, eyes do not cross midline ?follow commands with grip LUE otherwise not following commands Moves extremities to pain  Results - Imaging/Labs   Results for orders placed or performed during the hospital encounter of 03/17/19 (from the past 48 hour(s))  CBG monitoring, ED     Status: Abnormal   Collection Time: 03/17/19  6:01 PM  Result Value Ref Range   Glucose-Capillary 117 (H) 70 - 99 mg/dL   Comment 1 Notify RN    Comment 2 Document in Chart   Lactic acid, plasma     Status: Abnormal   Collection Time: 03/17/19  6:04 PM  Result Value Ref Range   Lactic Acid, Venous 2.5 (HH) 0.5 - 1.9 mmol/L    Comment: CRITICAL RESULT CALLED TO, READ BACK BY AND VERIFIED WITH: L.BISHOP RN 1840 03/17/2019 MCCORMICK K Performed at [facility], [place] [address], [place], [place] [address]   Comprehensive metabolic panel     Status: Abnormal   Collection Time: 03/17/19  6:11 PM  Result Value Ref Range   Sodium 143 135 - 145 mmol/L   Potassium 3.2 (L) 3.5 - 5.1 mmol/L   Chloride 113 (H) 98 - 111 mmol/L   CO2 19 (L) 22 - 32 mmol/L   Glucose, Bld 139  (H) 70 - 99 mg/dL   BUN 27 (H) 8 - 23 mg/dL   Creatinine, Ser 1.17 (H) 0.44 - 1.00 mg/dL   Calcium 8.8 (L) 8.9 - 10.3 mg/dL   Total Protein 6.7 6.5 - 8.1 g/dL   Albumin 3.5 3.5 - 5.0 g/dL   AST 34 15 - 41 U/L   ALT 17 0 - 44 U/L   Alkaline Phosphatase 60 38 - 126 U/L   Total Bilirubin 2.4 (H) 0.3 - 1.2 mg/dL   GFR calc non Af Amer 42 (L) >60 mL/min   GFR calc Af Amer 48 (L) >60 mL/min   Anion gap 11 5 - 15    Comment: Performed at [facility], [place] [address] [place], [place] [address]  CBC with Differential     Status: Abnormal   Collection Time: 03/17/19  6:11 PM  Result Value Ref Range   WBC 8.4 4.0 - 10.5 K/uL   RBC 4.06 3.87 - 5.11 MIL/uL   Hemoglobin 13.3 12.0 - 15.0 g/dL   HCT 40.0 36.0 - 46.0 %   MCV 98.5 80.0 - 100.0 fL   MCH 32.8 26.0 - 34.0 pg   MCHC 33.3 30.0 - 36.0 g/dL   RDW 13.2 11.5 - 15.5 %   Platelets 122 (L) 150 - 400 K/uL    Comment: REPEATED TO VERIFY   nRBC 0.0 0.0 - 0.2 %   Neutrophils Relative % 86 %   Neutro Abs 7.3 1.7 - 7.7 K/uL   Lymphocytes Relative 8 %   Lymphs Abs 0.7 0.7 - 4.0 K/uL   Monocytes Relative 5 %   Monocytes Absolute 0.4 0.1 - 1.0 K/uL   Eosinophils Relative 0 %   Eosinophils Absolute 0.0 0.0 - 0.5 K/uL   Basophils Relative 0 %   Basophils Absolute 0.0 0.0 - 0.1 K/uL   Immature Granulocytes 1 %   Abs Immature Granulocytes 0.04 0.00 - 0.07 K/uL    Comment: Performed at [facility] [address] [place],  [address]  CK  Status: Abnormal   Collection Time: 03/17/19  6:11 PM  Result Value Ref Range   Total CK 629 (H) 38 - 234 U/L    Comment: Performed at [facility], [place] [address], [place], [place] [address]  Protime-INR     Status: Abnormal   Collection Time: 03/17/19  7:22 PM  Result Value Ref Range   Prothrombin Time 15.6 (H) 11.4 - 15.2 seconds   INR 1.3 (H) 0.8 - 1.2    Comment: (NOTE) INR goal varies based on device and disease states. Performed at [facility], [place] [address] [place], [place] [address]   APTT     Status: None   Collection Time: 03/17/19  7:22 PM  Result Value Ref Range   aPTT 28 24 - 36 seconds    Comment: Performed at [facility], [place] [address] [place], [place] [address]  Ethanol     Status: None   Collection Time: 03/17/19  7:25 PM  Result Value Ref Range   Alcohol, Ethyl (B) <10 <10 mg/dL    Comment: (NOTE) Lowest detectable limit for serum alcohol is 10 mg/dL. For medical purposes only. Performed at [facility], [place] [address] [place], [place] [address]     Ct Head Wo Contrast  Result Date: 03/17/2019 CLINICAL DATA:  Pt found by the maintenance man. Last seen by family on Monday. Pt altered level of consciousness. Hypertension and strong smell of amnionia EXAM: CT HEAD WITHOUT CONTRAST; CT CERVICAL SPINE WITHOUT CONTRAST TECHNIQUE: Contiguous axial images were obtained from the base of the skull through the vertex without intravenous contrast. COMPARISON:  07/01/2018 FINDINGS: Brain: Significant blood identified within the LATERAL ventricles, LEFT greater than RIGHT. Blood is also identified in the third and fourth ventricles, layering dependently. There is parenchymal hemorrhage within the frontal lobe periventricular white matter, adjacent to the body of the LEFT LATERAL ventricle and contiguous with the intraventricular blood. This focus of hemorrhage is estimated to measure 9 x 9 millimeters. There is central and cortical atrophy. Periventricular white matter changes are consistent with small vessel disease. Vascular: There is atherosclerotic calcification of the internal carotid arteries. No hyperdense vessels. Skull: Normal. Negative for fracture or focal lesion. Sinuses/Orbits: No acute finding. Other: 11 significant scalp1 edema and hematoma along the vertex, not associated with underlying fracture. IMPRESSION: 1. Large intraventricular hemorrhage with ventricular dilatation. 2. 9 millimeter intraparenchymal hemorrhage  within the frontal lobe periventricular white matter, adjacent to the body of the LEFT LATERAL ventricle. 3. Atrophy and small vessel disease. 4. Significant scalp edema and hematoma along the vertex, not associated with underlying fracture. Critical Value/emergent results were called by telephone at the time of interpretation on 03/17/2019 at 7:00 pm to [facility] , who verbally acknowledged these results. Electronically Signed   By: Nolon Nations M.D.   On: 03/17/2019 19:06   Ct Cervical Spine Wo Contrast  Result Date: 03/17/2019 CLINICAL DATA:  Pt found by the maintenance man. Last seen by family on Monday. Pt altered level of consciousness. Hypertension and strong smell of amnionia EXAM: CT HEAD WITHOUT CONTRAST; CT CERVICAL SPINE WITHOUT CONTRAST TECHNIQUE: Contiguous axial images were obtained from the base of the skull through the vertex without intravenous contrast. COMPARISON:  07/01/2018 FINDINGS: Brain: Significant blood identified within the LATERAL ventricles, LEFT greater than RIGHT. Blood is also identified in the third and fourth ventricles, layering dependently. There is parenchymal hemorrhage within the frontal lobe periventricular white matter, adjacent to  the body of the LEFT LATERAL ventricle and contiguous with the intraventricular blood. This focus of hemorrhage is estimated to measure 9 x 9 millimeters. There is central and cortical atrophy. Periventricular white matter changes are consistent with small vessel disease. Vascular: There is atherosclerotic calcification of the internal carotid arteries. No hyperdense vessels. Skull: Normal. Negative for fracture or focal lesion. Sinuses/Orbits: No acute finding. Other: 11 significant scalp1 edema and hematoma along the vertex, not associated with underlying fracture. IMPRESSION: 1. Large intraventricular hemorrhage with ventricular dilatation. 2. 9 millimeter intraparenchymal hemorrhage within the frontal lobe  periventricular white matter, adjacent to the body of the LEFT LATERAL ventricle. 3. Atrophy and small vessel disease. 4. Significant scalp edema and hematoma along the vertex, not associated with underlying fracture. Critical Value/emergent results were called by telephone at the time of interpretation on 03/17/2019 at 7:00 pm to [facility] , who verbally acknowledged these results. Electronically Signed   By: Nolon Nations M.D.   On: 03/17/2019 19:06   Dg Pelvis Portable  Result Date: 03/17/2019 CLINICAL DATA:  Recent fall with pelvic pain, initial encounter EXAM: PORTABLE PELVIS 1-2 VIEWS COMPARISON:  None. FINDINGS: Pelvic ring is intact. Degenerative changes of the lumbar spine and hip joints are seen. Diffuse vascular calcifications are noted. No acute bony abnormality is noted. IMPRESSION: No acute abnormality noted. Electronically Signed   By: Inez Catalina M.D.   On: 03/17/2019 19:09   Dg Chest Port 1 View  Result Date: 03/17/2019 CLINICAL DATA:  Altered level of consciousness EXAM: PORTABLE CHEST 1 VIEW COMPARISON:  Radiograph 10/20/2018, CT 09/17/2016 FINDINGS: Cardiomegaly and central venous congestion, similar to prior. Suspect mild interstitial edema as well with hazy interstitial opacities and indistinct cephalized vascularity. No focal consolidative opacity. No pneumothorax or effusion. The aorta is calcified. The remaining cardiomediastinal contours are unremarkable. Degenerative changes are present in the spine and shoulders including a high-riding appearance of the right humeral head likely reflecting chronic rotator cuff insufficiency. IMPRESSION: Cardiomegaly with mild interstitial edema. Electronically Signed   By: Lovena Le M.D.   On: 03/17/2019 19:08   Impression/Plan   85 y.o. female with left frontal intraparenchymal hemorrhage with intraventricular extension. There is resultant ventricular dilatation. I have reviewed the imaging, case and physical examination  findings with Dr Sherwood Gambler who has also reviewed the imaging. We do not believe there is any role for NS intervention at present, including placement of EVD. Rec Neurology consult. Can repeat head CT Friday am for monitoring, sooner as dictated by Neurology.   Ferne Reus, PA-C [facility] [employer]

## 2019-03-17 NOTE — ED Notes (Signed)
Pt has bruising to right side of forehead, pt appears to have a forced deviation to the left or will not cross midline. Pt is not following commands or answering questions. Pt is mumbling incomprehensible speech. Pt has bruising and a skin tear to the right shoulder.  Pt taken to CT with this RN. C-collar is in place from EMS.

## 2019-03-18 ENCOUNTER — Inpatient Hospital Stay (HOSPITAL_COMMUNITY): Payer: Medicare Other

## 2019-03-18 DIAGNOSIS — L8990 Pressure ulcer of unspecified site, unspecified stage: Secondary | ICD-10-CM | POA: Insufficient documentation

## 2019-03-18 DIAGNOSIS — I351 Nonrheumatic aortic (valve) insufficiency: Secondary | ICD-10-CM

## 2019-03-18 DIAGNOSIS — I6300 Cerebral infarction due to thrombosis of unspecified precerebral artery: Secondary | ICD-10-CM

## 2019-03-18 LAB — BLOOD CULTURE ID PANEL (REFLEXED)

## 2019-03-18 LAB — ECHOCARDIOGRAM COMPLETE

## 2019-03-18 LAB — MRSA PCR SCREENING: MRSA by PCR: NEGATIVE

## 2019-03-18 LAB — BILIRUBIN, DIRECT: Bilirubin, Direct: 0.4 mg/dL — ABNORMAL HIGH (ref 0.0–0.2)

## 2019-03-18 LAB — LACTIC ACID, PLASMA: Lactic Acid, Venous: 2.8 mmol/L (ref 0.5–1.9)

## 2019-03-18 MED ORDER — POTASSIUM CHLORIDE 10 MEQ/100ML IV SOLN
10.0000 meq | INTRAVENOUS | Status: AC
  Administered 2019-03-18 (×2): 10 meq via INTRAVENOUS
  Filled 2019-03-18 (×2): qty 100

## 2019-03-18 MED ORDER — CHLORHEXIDINE GLUCONATE CLOTH 2 % EX PADS
6.0000 | MEDICATED_PAD | Freq: Every day | CUTANEOUS | Status: DC
  Administered 2019-03-18 – 2019-03-27 (×8): 6 via TOPICAL

## 2019-03-18 MED ORDER — SODIUM CHLORIDE 0.9 % IV SOLN
INTRAVENOUS | Status: DC | PRN
  Administered 2019-03-18: 1000 mL via INTRAVENOUS
  Administered 2019-03-26: 05:00:00 via INTRAVENOUS

## 2019-03-18 MED ORDER — SODIUM CHLORIDE 0.9 % IV SOLN
INTRAVENOUS | Status: DC
  Administered 2019-03-18 – 2019-03-20 (×3): via INTRAVENOUS
  Administered 2019-03-21: 22:00:00 500 mL via INTRAVENOUS
  Administered 2019-03-21: 04:00:00 via INTRAVENOUS

## 2019-03-18 MED ORDER — CHLORHEXIDINE GLUCONATE 0.12 % MT SOLN
15.0000 mL | Freq: Two times a day (BID) | OROMUCOSAL | Status: DC
  Administered 2019-03-18 (×2): 15 mL via OROMUCOSAL
  Filled 2019-03-18 (×2): qty 15

## 2019-03-18 MED ORDER — POTASSIUM CHLORIDE 10 MEQ/100ML IV SOLN
10.0000 meq | INTRAVENOUS | Status: DC
  Administered 2019-03-18 (×4): 10 meq via INTRAVENOUS
  Filled 2019-03-18 (×4): qty 100

## 2019-03-18 MED ORDER — IOHEXOL 350 MG/ML SOLN
80.0000 mL | Freq: Once | INTRAVENOUS | Status: AC | PRN
  Administered 2019-03-18: 80 mL via INTRAVENOUS

## 2019-03-18 MED ORDER — LORAZEPAM 2 MG/ML IJ SOLN
INTRAMUSCULAR | Status: AC
  Administered 2019-03-19: 0.5 mg via INTRAVENOUS
  Filled 2019-03-18: qty 1

## 2019-03-18 MED ORDER — ORAL CARE MOUTH RINSE
15.0000 mL | Freq: Two times a day (BID) | OROMUCOSAL | Status: DC
  Administered 2019-03-18 (×2): 15 mL via OROMUCOSAL

## 2019-03-18 MED ORDER — BISOPROLOL FUMARATE 10 MG PO TABS
5.0000 mg | ORAL_TABLET | Freq: Every day | ORAL | Status: DC
  Administered 2019-03-19: 15:00:00 5 mg via ORAL
  Filled 2019-03-18: qty 1

## 2019-03-18 MED ORDER — ISOSORBIDE MONONITRATE ER 30 MG PO TB24
30.0000 mg | ORAL_TABLET | Freq: Every day | ORAL | Status: DC
  Administered 2019-03-19 – 2019-03-25 (×7): 30 mg via ORAL
  Filled 2019-03-18 (×7): qty 1

## 2019-03-18 NOTE — Progress Notes (Signed)
SLP Cancellation Note  Patient Details Name: Jane [name] MRN: [id number] DOB: Jun 29, 1929   Cancelled treatment:       Reason Eval/Treat Not Completed: Patient at procedure or test/unavailable(Pt off unit at this time for CT. SLP will follow up. )  Yaron Grasse I. Hardin Negus, [place], [place] Office number [phone] Pager [place] 03/18/2019, 9:42 AM

## 2019-03-18 NOTE — Progress Notes (Addendum)
PHARMACY - PHYSICIAN COMMUNICATION CRITICAL VALUE ALERT - BLOOD CULTURE IDENTIFICATION (BCID)  Jane [name] is an 85 y.o. female who presented to [facility] on 03/17/2019 with a chief complaint of unresponsiveness found to have periventricular hemorrhage.  Assessment:  2/4 bottles positive coag neg staph MecA detected, likely contaminant   Name of physician (or Provider) Contacted: Dr. Leonie Man, MD  Current antibiotics: none   Changes to prescribed antibiotics recommended:  Recommendations accepted by provider  Recommended to continue monitoring off abx   No results found for this or any previous visit.  [name] 03/18/2019  1:41 PM

## 2019-03-18 NOTE — Progress Notes (Signed)
PT Cancellation Note  Patient Details Name: [name] MRN: [id number] DOB: 02-23-1929   Cancelled Treatment:    Reason Eval/Treat Not Completed: Patient not medically ready.  Hold today per Dr. Leonie Man.  Will see 9/18 as able. 03/18/2019  Donnella Sham, [place] [phone]  (pager) [phone]  (office)   Tessie Fass [name] 03/18/2019, 11:39 AM

## 2019-03-18 NOTE — Progress Notes (Signed)
Attempted to see pt today. Pt on bedrest and MD asked to hold off on therapy for today.  Will attempt back as schedule allows. Jinger Neighbors, [place] [phone]

## 2019-03-18 NOTE — Progress Notes (Signed)
Echocardiogram 2D Echocardiogram has been performed.  Oneal Deputy Madigan Rosensteel 03/18/2019, 12:48 PM

## 2019-03-18 NOTE — Progress Notes (Signed)
STROKE TEAM PROGRESS NOTE   INTERVAL HISTORY I have personally reviewed history of presenting illness, electronic medical records and imaging films in PACS.  She was found down in her home 2 days after being last seen.  Blood pressure was elevated and CT scan shows intraventricular hemorrhage.  She has remained stable overnight and blood pressure has been controlled.  She is drowsy but can be aroused and speaks very little  Vitals:   03/18/19 0645 03/18/19 0700 03/18/19 0800 03/18/19 0822  BP: (!) 145/63 (!) 145/72 (!) 129/54   Pulse: (!) 114 (!) 126 (!) 102   Resp: (!) 21 18 17    Temp:    98 F (36.7 C)  TempSrc:    Oral  SpO2: 97% 97% 97%     CBC:  Recent Labs  Lab 03/17/19 1811  WBC 8.4  NEUTROABS 7.3  HGB 13.3  HCT 40.0  MCV 98.5  PLT 122*    Basic Metabolic Panel:  Recent Labs  Lab 03/17/19 1811 03/17/19 1922  NA 143  --   K 3.2*  --   CL 113*  --   CO2 19*  --   GLUCOSE 139*  --   BUN 27*  --   CREATININE 1.17*  --   CALCIUM 8.8*  --   MG  --  1.7   Lipid Panel:     Component Value Date/Time   CHOL 140 12/31/2016 1409   TRIG 57 12/31/2016 1409   HDL 61 12/31/2016 1409   CHOLHDL 2.3 12/31/2016 1409   CHOLHDL 4.3 06/01/2014 0633   VLDL 17 06/01/2014 0633   LDLCALC 68 12/31/2016 1409   HgbA1c:  Lab Results  Component Value Date   HGBA1C 5.7 (H) 06/01/2014   Urine Drug Screen: No results found for: LABOPIA, COCAINSCRNUR, LABBENZ, AMPHETMU, THCU, LABBARB  Alcohol Level     Component Value Date/Time   ETH <10 03/17/2019 1925    IMAGING Ct Head Wo Contrast  Addendum Date: 03/17/2019   ADDENDUM REPORT: 03/17/2019 22:19 ADDENDUM: Cervical spine: Comparison 10/16/2018 Alignment: There is loss of cervical lordosis. This may be secondary to splinting, soft tissue injury, or positioning. There is stable anterolisthesis of C2 on C3, C3 on C4, and C4 on C5. Skull base and vertebrae: No acute fracture. Disc spaces: Significant degenerative changes are  identified throughout the cervical spine. Soft tissues and spinal canal: No acute abnormality. UPPER chest: No acute abnormality. Other: Carotid calcifications bilaterally. IMPRESSION: Significant stable degenerative changes throughout the cervical spine. No evidence for acute abnormality. Electronically Signed   By: Nolon Nations M.D.   On: 03/17/2019 22:19   Result Date: 03/17/2019 CLINICAL DATA:  Pt found by the maintenance man. Last seen by family on Monday. Pt altered level of consciousness. Hypertension and strong smell of amnionia EXAM: CT HEAD WITHOUT CONTRAST; CT CERVICAL SPINE WITHOUT CONTRAST TECHNIQUE: Contiguous axial images were obtained from the base of the skull through the vertex without intravenous contrast. COMPARISON:  07/01/2018 FINDINGS: Brain: Significant blood identified within the LATERAL ventricles, LEFT greater than RIGHT. Blood is also identified in the third and fourth ventricles, layering dependently. There is parenchymal hemorrhage within the frontal lobe periventricular white matter, adjacent to the body of the LEFT LATERAL ventricle and contiguous with the intraventricular blood. This focus of hemorrhage is estimated to measure 9 x 9 millimeters. There is central and cortical atrophy. Periventricular white matter changes are consistent with small vessel disease. Vascular: There is atherosclerotic calcification of the internal carotid arteries.  No hyperdense vessels. Skull: Normal. Negative for fracture or focal lesion. Sinuses/Orbits: No acute finding. Other: 11 significant scalp1 edema and hematoma along the vertex, not associated with underlying fracture. IMPRESSION: 1. Large intraventricular hemorrhage with ventricular dilatation. 2. 9 millimeter intraparenchymal hemorrhage within the frontal lobe periventricular white matter, adjacent to the body of the LEFT LATERAL ventricle. 3. Atrophy and small vessel disease. 4. Significant scalp edema and hematoma along the vertex, not  associated with underlying fracture. Critical Value/emergent results were called by telephone at the time of interpretation on 03/17/2019 at 7:00 pm to [facility] , who verbally acknowledged these results. Electronically Signed: By: Nolon Nations M.D. On: 03/17/2019 19:06   Ct Cervical Spine Wo Contrast  Addendum Date: 03/17/2019   ADDENDUM REPORT: 03/17/2019 22:19 ADDENDUM: Cervical spine: Comparison 10/16/2018 Alignment: There is loss of cervical lordosis. This may be secondary to splinting, soft tissue injury, or positioning. There is stable anterolisthesis of C2 on C3, C3 on C4, and C4 on C5. Skull base and vertebrae: No acute fracture. Disc spaces: Significant degenerative changes are identified throughout the cervical spine. Soft tissues and spinal canal: No acute abnormality. UPPER chest: No acute abnormality. Other: Carotid calcifications bilaterally. IMPRESSION: Significant stable degenerative changes throughout the cervical spine. No evidence for acute abnormality. Electronically Signed   By: Nolon Nations M.D.   On: 03/17/2019 22:19   Result Date: 03/17/2019 CLINICAL DATA:  Pt found by the maintenance man. Last seen by family on Monday. Pt altered level of consciousness. Hypertension and strong smell of amnionia EXAM: CT HEAD WITHOUT CONTRAST; CT CERVICAL SPINE WITHOUT CONTRAST TECHNIQUE: Contiguous axial images were obtained from the base of the skull through the vertex without intravenous contrast. COMPARISON:  07/01/2018 FINDINGS: Brain: Significant blood identified within the LATERAL ventricles, LEFT greater than RIGHT. Blood is also identified in the third and fourth ventricles, layering dependently. There is parenchymal hemorrhage within the frontal lobe periventricular white matter, adjacent to the body of the LEFT LATERAL ventricle and contiguous with the intraventricular blood. This focus of hemorrhage is estimated to measure 9 x 9 millimeters. There is central and  cortical atrophy. Periventricular white matter changes are consistent with small vessel disease. Vascular: There is atherosclerotic calcification of the internal carotid arteries. No hyperdense vessels. Skull: Normal. Negative for fracture or focal lesion. Sinuses/Orbits: No acute finding. Other: 11 significant scalp1 edema and hematoma along the vertex, not associated with underlying fracture. IMPRESSION: 1. Large intraventricular hemorrhage with ventricular dilatation. 2. 9 millimeter intraparenchymal hemorrhage within the frontal lobe periventricular white matter, adjacent to the body of the LEFT LATERAL ventricle. 3. Atrophy and small vessel disease. 4. Significant scalp edema and hematoma along the vertex, not associated with underlying fracture. Critical Value/emergent results were called by telephone at the time of interpretation on 03/17/2019 at 7:00 pm to [facility] , who verbally acknowledged these results. Electronically Signed: By: Nolon Nations M.D. On: 03/17/2019 19:06   Dg Pelvis Portable  Result Date: 03/17/2019 CLINICAL DATA:  Recent fall with pelvic pain, initial encounter EXAM: PORTABLE PELVIS 1-2 VIEWS COMPARISON:  None. FINDINGS: Pelvic ring is intact. Degenerative changes of the lumbar spine and hip joints are seen. Diffuse vascular calcifications are noted. No acute bony abnormality is noted. IMPRESSION: No acute abnormality noted. Electronically Signed   By: Inez Catalina M.D.   On: 03/17/2019 19:09   Dg Chest Port 1 View  Result Date: 03/17/2019 CLINICAL DATA:  Altered level of consciousness EXAM: PORTABLE CHEST 1 VIEW COMPARISON:  Radiograph  10/20/2018, CT 09/17/2016 FINDINGS: Cardiomegaly and central venous congestion, similar to prior. Suspect mild interstitial edema as well with hazy interstitial opacities and indistinct cephalized vascularity. No focal consolidative opacity. No pneumothorax or effusion. The aorta is calcified. The remaining cardiomediastinal  contours are unremarkable. Degenerative changes are present in the spine and shoulders including a high-riding appearance of the right humeral head likely reflecting chronic rotator cuff insufficiency. IMPRESSION: Cardiomegaly with mild interstitial edema. Electronically Signed   By: Lovena Le M.D.   On: 03/17/2019 19:08    PHYSICAL EXAM Frail malnourished looking elderly Caucasian lady not in distress. . Afebrile. Head is nontraumatic. Neck is supple without bruit.    Cardiac exam no murmur or gallop. Lungs are clear to auscultation. Distal pulses are well felt. Neurological Exam She is drowsy but arouses easily.  She has nonfluent speech slightly dysarthric speaks only occasional words and short sentences.  Follows simple midline commands only.  Eyes are disconjugate with left eye deviated outwards and right eye in mid position.  Pupils irregular reactive.  Fundi could not visualize.  Right lower facial asymmetry tongue midline.  Motor system exam shows some spontaneous lower extremity movements left more than right.  There is mild right-sided weakness but does withdraw the right side to pain moves left side spontaneously.  Tone is diminished in the right arm normal in the right leg.  Right plantars upgoing left is equivocal.  Gait not tested. ASSESSMENT/PLAN Ms. RUQAYAH TATARIAN is a 85 y.o. female with history of HTN, HLD, previous stroke, CAD, CHF, CKD found down, presenting with severe HTN, L gaze deviation, not following commands. CT shows hemorrhage.   Stroke:   L frontal periventricular hemorrhage, L IVH secondary to uncontrolled hypertension  CT CS sign degenerative changes, no acute abnormality  CT head large IVH w/ ventricular dilatation. 9 mm L frontal lobe. Small vessel disease. Atrophy. Sign scalp edema and hematoma along vertex, no fx.  CTA head & neck no underlying vascular lesion for L hemorrhage. Advanced Aortic atherosclerosis. Atherosclerosis B ICA bifurcations mas 60%, 30-50%  B VAs, 50% or less B ICA siphons, 70% or more R V4, 50-70% L V4.  MRI  pending   2D Echo pending   LDL pending   HgbA1c pending   SCDs for VTE prophylaxis  aspirin 81 mg daily prior to admission, now on No antithrombotic given hemorrhage   Therapy recommendations:  Pending. Hold therapy evals today. Ok to be OOB in am   Disposition:  pending (essemntiall independent x paperwork PTA)  Hypertensive Emergency  Home meds:  Lasix 20, isosorbide 30, losasrtan 100  Bp 234/127 on arrival  Put on Cleviprex gtt  Stable today  Resume home meds once swallow . SBP goal 120-140,  increase to < 160 as CTA stable  . Long-term BP goal normotensive  Hyperlipidemia  Home meds:  lipitor 40  LDL pending   Hold lipitor given ICH  Consider continuation of statin at discharge  Dysphagia . Secondary to stroke . NPO  Add IVF at 60h . Speech on board   Other Stroke Risk Factors  Advanced age  Former Cigarette smoker  Hx stroke/TIA  Documented hx stroke but not details in [place] (occurred prior to 2014)  Coronary artery disease  Hx Congestive heart failure  Other Active Problems  Malnourished, There is no height or weight on file to calculate BMI.   Hypokalemia 3.2, replace - recheck in am  CKD stage 3  CXR - cardiomegaly w/ mild interstitial  edema  Hx temporal arteritis 11/2012  Hospital day # 1  I have personally obtained history,examined this patient, reviewed notes, independently viewed imaging studies, participated in medical decision making and plan of care.ROS completed by me personally and pertinent positives fully documented  I have made any additions or clarifications directly to the above note.  She has presented with intracerebral hemorrhage secondary to uncontrolled hypertension possibly more than a couple of days old.  Check repeat CT scan of the head as well as CT angiogram of the brain and neck.  Strict control of blood pressure with systolic blood  pressure goal below 160 and close neurological monitoring.  Check MRI scan tomorrow.  Bedrest today and mobilize out of bed tomorrow.  Physical occupational speech therapy consults.  I had a long discussion with the patient's nephew who is her health power of attorney over the phone and updated him on her situation.  We discussed briefly goals of care and DNR at the present time he wants full support but is will speak to patient's nieces and let me know his decision. This patient is critically ill and at significant risk of neurological worsening, death and care requires constant monitoring of vital signs, hemodynamics,respiratory and cardiac monitoring, extensive review of multiple databases, frequent neurological assessment, discussion with family, other specialists and medical decision making of high complexity.I have made any additions or clarifications directly to the above note.This critical care time does not reflect procedure time, or teaching time or supervisory time of PA/NP/Med Resident etc but could involve care discussion time.  I spent 30 minutes of neurocritical care time  in the care of  this patient.     Antony Contras, MD Medical Director [facility] Pager: [phone] 03/18/2019 1:28 PM   To contact Stroke Continuity provider, please refer to [email or web address] After hours, contact General Neurology

## 2019-03-19 DIAGNOSIS — G936 Cerebral edema: Secondary | ICD-10-CM

## 2019-03-19 DIAGNOSIS — I615 Nontraumatic intracerebral hemorrhage, intraventricular: Secondary | ICD-10-CM

## 2019-03-19 LAB — CBC
HCT: 32.8 % — ABNORMAL LOW (ref 36.0–46.0)
Hemoglobin: 11.6 g/dL — ABNORMAL LOW (ref 12.0–15.0)
MCH: 34.1 pg — ABNORMAL HIGH (ref 26.0–34.0)
MCHC: 35.4 g/dL (ref 30.0–36.0)
MCV: 96.5 fL (ref 80.0–100.0)
Platelets: 93 10*3/uL — ABNORMAL LOW (ref 150–400)
RBC: 3.4 MIL/uL — ABNORMAL LOW (ref 3.87–5.11)
RDW: 13.9 % (ref 11.5–15.5)
WBC: 9 10*3/uL (ref 4.0–10.5)
nRBC: 0 % (ref 0.0–0.2)

## 2019-03-19 LAB — BASIC METABOLIC PANEL
Anion gap: 10 (ref 5–15)
BUN: 31 mg/dL — ABNORMAL HIGH (ref 8–23)
CO2: 14 mmol/L — ABNORMAL LOW (ref 22–32)
Calcium: 8.5 mg/dL — ABNORMAL LOW (ref 8.9–10.3)
Chloride: 119 mmol/L — ABNORMAL HIGH (ref 98–111)
Creatinine, Ser: 1.18 mg/dL — ABNORMAL HIGH (ref 0.44–1.00)
GFR calc Af Amer: 48 mL/min — ABNORMAL LOW (ref >60)
GFR calc non Af Amer: 41 mL/min — ABNORMAL LOW (ref >60)
Glucose, Bld: 96 mg/dL (ref 70–99)
Potassium: 3.8 mmol/L (ref 3.5–5.1)
Sodium: 143 mmol/L (ref 135–145)

## 2019-03-19 LAB — LIPID PANEL
Cholesterol: 185 mg/dL (ref 0–200)
HDL: 43 mg/dL (ref >40)
LDL Cholesterol: 113 mg/dL — ABNORMAL HIGH (ref 0–99)
Total CHOL/HDL Ratio: 4.3 RATIO
Triglycerides: 146 mg/dL (ref <150)
VLDL: 29 mg/dL (ref 0–40)

## 2019-03-19 LAB — GLUCOSE, CAPILLARY
Glucose-Capillary: 85 mg/dL (ref 70–99)
Glucose-Capillary: 109 mg/dL — ABNORMAL HIGH (ref 70–99)

## 2019-03-19 MED ORDER — LABETALOL HCL 5 MG/ML IV SOLN
10.0000 mg | INTRAVENOUS | Status: DC | PRN
  Administered 2019-03-19 – 2019-03-22 (×6): 20 mg via INTRAVENOUS
  Administered 2019-03-24: 10 mg via INTRAVENOUS
  Administered 2019-03-24: 04:00:00 20 mg via INTRAVENOUS
  Filled 2019-03-19 (×8): qty 4

## 2019-03-19 MED ORDER — PRO-STAT SUGAR FREE PO LIQD
30.0000 mL | Freq: Every day | ORAL | Status: DC
  Administered 2019-03-19 – 2019-03-22 (×4): 30 mL
  Filled 2019-03-19 (×4): qty 30

## 2019-03-19 MED ORDER — BISOPROLOL FUMARATE 10 MG PO TABS
5.0000 mg | ORAL_TABLET | Freq: Every day | ORAL | Status: DC
  Administered 2019-03-20 – 2019-03-22 (×3): 5 mg
  Filled 2019-03-19 (×3): qty 1

## 2019-03-19 MED ORDER — LORAZEPAM 2 MG/ML IJ SOLN: 0.5000 mg | Freq: Once | INTRAMUSCULAR | Status: DC

## 2019-03-19 MED ORDER — JEVITY 1.2 CAL PO LIQD
1000.0000 mL | ORAL | Status: DC
  Administered 2019-03-19 – 2019-03-20 (×2): 1000 mL
  Filled 2019-03-19 (×5): qty 1000

## 2019-03-19 MED ORDER — LORAZEPAM 2 MG/ML IJ SOLN
0.5000 mg | Freq: Once | INTRAMUSCULAR | Status: DC
  Administered 2019-03-19: 0.5 mg via INTRAVENOUS

## 2019-03-19 MED ORDER — CHLORHEXIDINE GLUCONATE 0.12 % MT SOLN
15.0000 mL | Freq: Two times a day (BID) | OROMUCOSAL | Status: DC
  Administered 2019-03-19 – 2019-03-27 (×17): 15 mL via OROMUCOSAL
  Filled 2019-03-19 (×13): qty 15

## 2019-03-19 MED ORDER — ORAL CARE MOUTH RINSE
15.0000 mL | Freq: Two times a day (BID) | OROMUCOSAL | Status: DC
  Administered 2019-03-19 – 2019-03-27 (×16): 15 mL via OROMUCOSAL

## 2019-03-19 NOTE — Evaluation (Signed)
Physical Therapy Evaluation Patient Details Name: Jane [name] MRN: [id number] DOB: 06/08/1929 Today's Date: 03/19/2019   History of Present Illness  85 y.o. female admitted on 03/18/19 for L frontal periventricular hemorrhage, L IVH secondary to uncontrolled HTN.  Pt with significant PMH of stroke, HTN, CKD, CHF, CAD, anemia.  Clinical Impression  Pt lethargic, eyes closed most of session, talking but often echoing what therapist is saying or mumbling nonsensically.  Pt spontaneously moving extremities more briskly on the right, but is very rigid (increased tone) in bil arms and legs.  HR up to 124 and sustained there during EOB time.  She will likely need a prolonged period of rehab, so recommending SNF.   PT to follow acutely for deficits listed below.      Follow Up Recommendations SNF    Equipment Recommendations  Wheelchair (measurements PT);Wheelchair cushion (measurements PT);Hospital bed;Other (comment)(hoyer lift)    Recommendations for Other Services   NA     Precautions / Restrictions Precautions Precautions: Fall      Mobility  Bed Mobility Overal bed mobility: Needs Assistance Bed Mobility: Supine to Sit;Sit to Supine     Supine to sit: +2 for physical assistance;Total assist Sit to supine: +2 for physical assistance;Total assist   General bed mobility comments: Two person total assist for mobility to EOB and back to supine.       Modified Rankin (Stroke Patients Only) Modified Rankin (Stroke Patients Only) Pre-Morbid Rankin Score: No significant disability Modified Rankin: Severe disability     Balance Overall balance assessment: Needs assistance Sitting-balance support: Feet supported;No upper extremity supported Sitting balance-Leahy Scale: Poor Sitting balance - Comments: mod assist EOB, some slow, delayed balance reactions with limited trunk firing.  Postural control: Posterior lean;Right lateral lean                                    Pertinent Vitals/Pain Pain Assessment: Faces Faces Pain Scale: Hurts even more Pain Location: grimacing with return to supine from sitting (possible back pain) Pain Descriptors / Indicators: Grimacing;Guarding Pain Intervention(s): Monitored during session;Limited activity within patient's tolerance;Repositioned    Home Living Family/patient expects to be discharged to:: Private residence Living Arrangements: Alone Available Help at Discharge: Family;Available PRN/intermittently Type of Home: Apartment Home Access: Elevator     Home Layout: One level Home Equipment: Walker - 2 wheels;Tub bench;Grab bars - tub/shower;Grab bars - toilet Additional Comments: Nephew provides some meals and checks in on her intermittently. He takes her for errands.     Prior Function Level of Independence: Needs assistance   Gait / Transfers Assistance Needed: Furniture walks in apartment, uses RW for community mobility  ADL's / Homemaking Assistance Needed: pt reports sponge bathing, manages medication indepedently and completes light meal mgmt   Comments: per previous chart review she has aids     Hand Dominance   Dominant Hand: Right    Extremity/Trunk Assessment   Upper Extremity Assessment Upper Extremity Assessment: Defer to OT evaluation    Lower Extremity Assessment Lower Extremity Assessment: RLE deficits/detail;LLE deficits/detail RLE Deficits / Details: bil LE with spontaneous movement at times, but also very rigid with seemingly increased tone bil.  LLE Deficits / Details: bil LE with spontaneous movement at times, but also very rigid with seemingly increased tone bil.     Cervical / Trunk Assessment Cervical / Trunk Assessment: Kyphotic  Communication   Communication: HOH  Cognition Arousal/Alertness: Lethargic  Behavior During Therapy: Flat affect Overall Cognitive Status: Impaired/Different from baseline Area of Impairment: Orientation;Attention;Memory;Following  commands;Safety/judgement;Awareness;Problem solving                 Orientation Level: Disoriented to;Person;Place;Time;Situation Current Attention Level: Focused Memory: Decreased recall of precautions;Decreased short-term memory Following Commands: Follows one step commands inconsistently Safety/Judgement: Decreased awareness of safety;Decreased awareness of deficits Awareness: Intellectual Problem Solving: Slow processing;Decreased initiation;Difficulty sequencing;Requires verbal cues;Requires tactile cues General Comments: Pt did not follow commads, responded to her name being called, and mumbling, echoing what PT was asking her at times, but often incoherant.               Assessment/Plan    PT Assessment Patient needs continued PT services  PT Problem List Decreased strength;Decreased range of motion;Decreased activity tolerance;Decreased balance;Decreased mobility;Decreased coordination;Decreased cognition;Decreased knowledge of use of DME;Decreased safety awareness;Decreased knowledge of precautions;Cardiopulmonary status limiting activity;Impaired sensation;Impaired tone;Obesity;Decreased skin integrity;Pain       PT Treatment Interventions DME instruction;Gait training;Stair training;Functional mobility training;Therapeutic activities;Therapeutic exercise;Balance training;Neuromuscular re-education;Cognitive remediation;Patient/family education;Wheelchair mobility training;Manual techniques;Modalities    PT Goals (Current goals can be found in the Care Plan section)  Acute Rehab PT Goals Patient Stated Goal: unable to state PT Goal Formulation: Patient unable to participate in goal setting Time For Goal Achievement: 04/02/19 Potential to Achieve Goals: Good    Frequency Min 3X/week   Barriers to discharge Decreased caregiver support      Co-evaluation PT/OT/SLP Co-Evaluation/Treatment: Yes Reason for Co-Treatment: Complexity of the patient's impairments  (multi-system involvement);Necessary to address cognition/behavior during functional activity;For patient/therapist safety;To address functional/ADL transfers PT goals addressed during session: Mobility/safety with mobility;Balance;Strengthening/ROM         AM-PAC PT "6 Clicks" Mobility  Outcome Measure Help needed turning from your back to your side while in a flat bed without using bedrails?: Total Help needed moving from lying on your back to sitting on the side of a flat bed without using bedrails?: Total Help needed moving to and from a bed to a chair (including a wheelchair)?: Total Help needed standing up from a chair using your arms (e.g., wheelchair or bedside chair)?: Total Help needed to walk in hospital room?: Total Help needed climbing 3-5 steps with a railing? : Total 6 Click Score: 6    End of Session   Activity Tolerance: Patient limited by lethargy;Patient limited by fatigue Patient left: in bed;with call bell/phone within reach;with bed alarm set Nurse Communication: Mobility status PT Visit Diagnosis: Muscle weakness (generalized) (M62.81);Difficulty in walking, not elsewhere classified (R26.2);Hemiplegia and hemiparesis Hemiplegia - Right/Left: Right Hemiplegia - dominant/non-dominant: Dominant Hemiplegia - caused by: Nontraumatic intracerebral hemorrhage    Time: 1206-1233 PT Time Calculation (min) (ACUTE ONLY): 27 min   Charges:      Wells Guiles B. Jayan Raymundo, PT, DPT  Acute Rehabilitation [phone] pager [phone]) [phone] office  @ [facility]: [phone]   PT Evaluation $PT Eval Moderate Complexity: 1 Mod         03/19/2019, 1:40 PM

## 2019-03-19 NOTE — Progress Notes (Signed)
STROKE TEAM PROGRESS NOTE   INTERVAL HISTORY Pt more lethargic, less interactive and increased right UE weakness. Will not be able to swallow today. Will reach out to family to discuss DNR.  MRI shows stable IVH but increase in subarachnoid component of blood. No hydrocephalous    Vitals:   03/19/19 0645 03/19/19 0700 03/19/19 0800 03/19/19 0815  BP: (!) 158/74 (!) 150/66 (!) 127/56 (!) 128/58  Pulse: (!) 123 (!) 119 97 (!) 104  Resp: (!) 21 18 17 17   Temp:    98.3 F (36.8 C)  TempSrc:    Axillary  SpO2: 96% 97% 97% 98%    CBC:  Recent Labs  Lab 03/17/19 1811 03/19/19 0621  WBC 8.4 9.0  NEUTROABS 7.3  --   HGB 13.3 11.6*  HCT 40.0 32.8*  MCV 98.5 96.5  PLT 122* 93*    Basic Metabolic Panel:  Recent Labs  Lab 03/17/19 1811 03/17/19 1922 03/19/19 0621  NA 143  --  143  K 3.2*  --  3.8  CL 113*  --  119*  CO2 19*  --  14*  GLUCOSE 139*  --  96  BUN 27*  --  31*  CREATININE 1.17*  --  1.18*  CALCIUM 8.8*  --  8.5*  MG  --  1.7  --    Lipid Panel:     Component Value Date/Time   CHOL 185 03/19/2019 0621   CHOL 140 12/31/2016 1409   TRIG 146 03/19/2019 0621   HDL 43 03/19/2019 0621   HDL 61 12/31/2016 1409   CHOLHDL 4.3 03/19/2019 0621   VLDL 29 03/19/2019 0621   LDLCALC 113 (H) 03/19/2019 0621   LDLCALC 68 12/31/2016 1409   HgbA1c:  Lab Results  Component Value Date   HGBA1C 5.7 (H) 06/01/2014   Urine Drug Screen: No results found for: LABOPIA, COCAINSCRNUR, LABBENZ, AMPHETMU, THCU, LABBARB  Alcohol Level     Component Value Date/Time   ETH <10 03/17/2019 1925    IMAGING Ct Head Wo Contrast 03/17/2019   1. Large intraventricular hemorrhage with ventricular dilatation. 2. 9 millimeter intraparenchymal hemorrhage within the frontal lobe periventricular white matter, adjacent to the body of the LEFT LATERAL ventricle. 3. Atrophy and small vessel disease. 4. Significant scalp edema and hematoma along the vertex, not associated with underlying fracture.    Ct Cervical Spine Wo Contrast 03/17/2019   Significant stable degenerative changes throughout the cervical spine. No evidence for acute abnormality.   Ct Angio Head W Or Wo Contrast Ct Angio Neck W Or Wo Contrast 03/18/2019 No evidence of underlying vascular lesion in the region of the deep brain hemorrhage on the left. Advanced aortic atherosclerosis. Atherosclerotic disease at both carotid bifurcations with maximal stenosis of 60% on each side. 30-50% stenosis at both vertebral artery origins. 50% or less stenosis in both carotid siphon regions. 70% or greater stenosis of the right vertebral artery V4 segment. 50-70% stenosis of the left vertebral artery V4 segment.   Mr Brain Wo Contrast 03/19/2019 1. Stable large volume of intraventricular hemorrhage, although superimposed subarachnoid hemorrhage volume appears increased from the CT on 03/17/2019 suggesting a degree of continued bleeding. 2. Stable ventriculomegaly, with mild transependymal edema. No significant midline shift. Basilar cisterns remain patent. 3. As before, the hemorrhage might have originated in the left periventricular white matter. No superimposed acute infarct. No underlying lesion is identified in the absence of IV contrast. 4. Scalp hematoma at the vertex re-identified.   2D Echocardiogram  1. Left ventricular ejection fraction, by visual estimation, is 60 to 65%. The left ventricle has normal function. Normal left ventricular size. Left ventricular septal wall thickness was mildly increased. Mildly increased left ventricular posterior wall thickness. There is mildly increased left ventricular hypertrophy.  2. Elevated left ventricular end-diastolic pressure.  3. Left ventricular diastolic Doppler parameters are consistent with pseudonormalization pattern of LV diastolic filling.  4. Global right ventricle has normal systolic function.The right ventricular size is normal. No increase in right ventricular wall thickness.   5. Left atrial size was normal.  6. Right atrial size was normal.  7. The mitral valve is normal in structure. Trace mitral valve regurgitation. No evidence of mitral stenosis.  8. The tricuspid valve is normal in structure. Tricuspid valve regurgitation is trivial.  9. The aortic valve The aortic valve is normal in structure. Aortic valve regurgitation is mild by color flow Doppler. Structurally normal aortic valve, with no evidence of sclerosis or stenosis. 10. The pulmonic valve was normal in structure. Pulmonic valve regurgitation is not visualized by color flow Doppler. 11. Mildly elevated pulmonary artery systolic pressure. 12. The inferior vena cava is normal in size with greater than 50% respiratory variability, suggesting right atrial pressure of 3 mmHg.  Dg Pelvis Portable 03/17/2019 No acute abnormality noted.   Dg Chest Port 1 View 03/17/2019 Cardiomegaly with mild interstitial edema.    PHYSICAL EXAM    Frail malnourished looking elderly Caucasian lady not in distress. . Afebrile. Head is nontraumatic. Neck is supple without bruit.    Cardiac exam no murmur or gallop. Lungs are clear to auscultation. Distal pulses are well felt. Neurological Exam She is stuporose  and arouses barely.  She is severely dysarthric speaks only occasional words and short sentences. Not following any commands   Eyes are dysconjugate with right gaze devaition  Pupils irregular reactive.  Fundi could not visualize.  Right lower facial asymmetry tongue midline.  Motor system exam shows some spontaneous lower extremity movements left more than right.  There is mild right-sided weakness but does withdraw the right side to pain moves left side spontaneously.  Tone is diminished in the right arm normal in the right leg.  Right plantars upgoing left is equivocal.  Gait not tested.  ASSESSMENT/PLAN Jane [name] is a 85 y.o. female with history of HTN, HLD, previous stroke, CAD, CHF, CKD found down,  presenting with severe HTN, L gaze deviation, not following commands. CT shows hemorrhage.   Stroke:   L frontal periventricular hemorrhage, L IVH secondary to uncontrolled hypertension  Not a NSY candidate  CT CS sign degenerative changes, no acute abnormality  CT head large IVH w/ ventricular dilatation. 9 mm L frontal lobe. Small vessel disease. Atrophy. Sign scalp edema and hematoma along vertex, no fx.  CTA head & neck no underlying vascular lesion for L hemorrhage. Advanced Aortic atherosclerosis. Atherosclerosis B ICA bifurcations mas 60%, 30-50% B VAs, 50% or less B ICA siphons, 70% or more R V4, 50-70% L V4.  MRI  Stable IVH, increased SAH. Hemorrhage originiated from L periventricular white matter. Stable ventriculomegaly, no midline shift. No infarct. Scalp hematoma  2D Echo EF 60-65%. No source of embolus   LDL 113  HgbA1c pending   UA, UCx pending   INR 1.3  SCDs for VTE prophylaxis  aspirin 81 mg daily prior to admission, now on No antithrombotic given hemorrhage   Therapy recommendations:  Ok to be OOB   Disposition:  pending (essemntiall  independent x paperwork PTA)  Hypertensive Emergency  Home meds:  Lasix 20, isosorbide 30, losasrtan 100  Bp 234/127 on arrival  SBP goal < 160 as CTA stable   Remains on Cleviprex gtt w/ BP within goal  Add prn labetalol  Resume home meds once swallow . Long-term BP goal normotensive  Hyperlipidemia  Home meds:  lipitor 40  LDL 113  Hold lipitor given ICH  Consider continuation of statin at discharge  Dysphagia . Secondary to stroke . NPO  On  IVF at 60h . Will be unable to pass swallow today . Place Cortrak . Speech on board   Other Stroke Risk Factors  Advanced age  Former Cigarette smoker  Hx stroke/TIA  Documented hx stroke but not details in [place] (occurred prior to 2014)  Coronary artery disease  Hx Congestive heart failure  Other Active Problems  Found down. Collar placed. CT CS  cleared bones. Discussed with trauma - literature now ok to remove collar with neg films. Will d/c collar. RN aware.  Malnourished, There is no height or weight on file to calculate BMI.   Hypokalemia 3.2, replace - 3.8  CKD stage 3 Cre 1.18  CXR - cardiomegaly w/ mild interstitial edema  Hx temporal arteritis 11/2012  Thrombocytopenia 93  Lactic acid 2.8  Hospital day # 2  The patient appears neurologically sleepier today with slightly increased right-sided weakness.  She requires close neurological monitoring and strict blood pressure control.  Keep systolic blood pressure goal below 160 will discontinue cervical collar as CT cervical spine showed no fracture.  I spoke to the patient's nephew over the phone and informed him about her prognosis and impending worsening which may necessitate intubation as well as insertion of feeding tube.  He stated that he will discuss with the rest of his family about her goals of care and inform as soon about family's decision.  Meanwhile she remains a full code.  We will get core tract feeding tube inserted today to start tube feeds and medicines This patient is critically ill and at significant risk of neurological worsening, death and care requires constant monitoring of vital signs, hemodynamics,respiratory and cardiac monitoring, extensive review of multiple databases, frequent neurological assessment, discussion with family, other specialists and medical decision making of high complexity.I have made any additions or clarifications directly to the above note.This critical care time does not reflect procedure time, or teaching time or supervisory time of PA/NP/Med Resident etc but could involve care discussion time.  I spent 30 minutes of neurocritical care time  in the care of  this patient.  Antony Contras, MD Medical Director [facility] Pager: [phone] 03/19/2019 9:24 AM   To contact Stroke Continuity provider, please refer to  [email or web address] After hours, contact General Neurology

## 2019-03-19 NOTE — Progress Notes (Signed)
SLP Cancellation Note  Patient Details Name: YOMNA HAVEMAN MRN: [id number] DOB: 13-May-1929   Cancelled treatment:        Pt with decreases responsivness per RN compared to yesterday. MRI noted increased SAH volume on MRI. Will continue efforts.    Houston Siren 03/19/2019, 11:45 AM  Orbie Pyo Colvin Caroli.Ed Risk analyst [phone] Office [phone]

## 2019-03-19 NOTE — Progress Notes (Signed)
Subjective: Patient resting in bed, mildly restless.  Not expressing any complaints.  Had recommended follow-up CT as an option for today, but MRI of brain done.  The MRI shows significant generalized atrophy.  No significant change in intraventricular hemorrhage.  Overall ventricular size is consistent with overall extensive generalized atrophy, and no hydrocephalus is present.  Objective: Vital signs in last 24 hours: Vitals:   03/19/19 1645 03/19/19 1700 03/19/19 1715 03/19/19 1730  BP: (!) 130/42 103/72 (!) 124/49 97/70  Pulse: 84 86 84 82  Resp: 18 14 19 17   Temp:      TempSrc:      SpO2: 94% 94% 95% 94%  Height:        Intake/Output from previous day: 09/17 0701 - 09/18 0700 In: 1438.8 [I.V.:1052.9; IV Piggyback:385.9] Out: 700 [Urine:700] Intake/Output this shift: Total I/O In: 853.1 [I.V.:753.1; NG/GT:100] Out: 400 [Urine:400]  Physical Exam: Mildly restless.  Occasional appropriate speech, but not following commands.  Moving all 4 extremities.  CBC Recent Labs    03/17/19 1811 03/19/19 0621  WBC 8.4 9.0  HGB 13.3 11.6*  HCT 40.0 32.8*  PLT 122* 93*   BMET Recent Labs    03/17/19 1811 03/19/19 0621  NA 143 143  K 3.2* 3.8  CL 113* 119*  CO2 19* 14*  GLUCOSE 139* 96  BUN 27* 31*  CREATININE 1.17* 1.18*  CALCIUM 8.8* 8.5*   ABG    Component Value Date/Time   HCO3 20.8 10/10/2014 1223   TCO2 25 07/01/2018 1918   ACIDBASEDEF 5.0 (H) 10/10/2014 1223   O2SAT 34.0 10/10/2014 1223    Studies/Results: Ct Angio Head W Or Wo Contrast  Result Date: 03/18/2019 CLINICAL DATA:  Follow-up intracranial hemorrhage EXAM: CT ANGIOGRAPHY HEAD AND NECK TECHNIQUE: Multidetector CT imaging of the head and neck was performed using the standard protocol during bolus administration of intravenous contrast. Multiplanar CT image reconstructions and MIPs were obtained to evaluate the vascular anatomy. Carotid stenosis measurements (when applicable) are obtained utilizing  NASCET criteria, using the distal internal carotid diameter as the denominator. CONTRAST:  62mL OMNIPAQUE IOHEXOL 350 MG/ML SOLN COMPARISON:  Head CT yesterday. FINDINGS: CTA NECK FINDINGS Aortic arch: Pronounced aortic atherosclerosis. Branching pattern is normal without origin stenosis. Right carotid system: Common carotid artery widely patent to the bifurcation. Calcified plaque at the carotid bifurcation and ICA bulb. Minimal diameter of the proximal ICA measures 2 mm. Compared to a more distal cervical ICA diameter of 5 mm, this indicates a 60% stenosis. Cervical ICA widely patent beyond that. Left carotid system: Common carotid artery is patent to the bifurcation region. Extensive calcified plaque at the carotid bifurcation and ICA bulb. Minimal diameter on this side is also 2 mm. Compared to a more distal cervical ICA diameter of 5 mm, this indicates a 60% stenosis. Vertebral arteries: Left vertebral artery is dominant. Both vertebral artery origins show calcified plaque with estimated stenosis of 30-50%. Beyond that, both vertebral arteries are patent through the cervical region to the foramen magnum. Skeleton: Chronic degenerative spondylosis. Old partial compression fractures at T3 and T4. Other neck: No mass or lymphadenopathy.  Mild thyroid goiter. Upper chest: Negative Review of the MIP images confirms the above findings CTA HEAD FINDINGS Anterior circulation: Both internal carotid arteries are patent through the skull base and siphon regions. There is siphon atherosclerotic calcification but without stenosis greater than 50%. Anterior and middle cerebral vessels are patent without proximal stenosis, aneurysm vascular malformation. In the region of the left deep  brain hemorrhage, no abnormal vascular structure is identified. Posterior circulation: Atherosclerotic disease of both vertebral artery V4 segments. Stenosis on the right is severe, 70% or greater. Stenosis on the left is moderate, 50-70%. Both  vessels reach the basilar. No basilar stenosis is seen. Posterior circulation branch vessels are patent. Venous sinuses: Patent and normal. Anatomic variants: None significant. Review of the MIP images confirms the above findings IMPRESSION: No evidence of underlying vascular lesion in the region of the deep brain hemorrhage on the left. Advanced aortic atherosclerosis. Atherosclerotic disease at both carotid bifurcations with maximal stenosis of 60% on each side. 30-50% stenosis at both vertebral artery origins. 50% or less stenosis in both carotid siphon regions. 70% or greater stenosis of the right vertebral artery V4 segment. 50-70% stenosis of the left vertebral artery V4 segment. Electronically Signed   By: Nelson Chimes M.D.   On: 03/18/2019 10:29   Ct Head Wo Contrast  Addendum Date: 03/17/2019   ADDENDUM REPORT: 03/17/2019 22:19 ADDENDUM: Cervical spine: Comparison 10/16/2018 Alignment: There is loss of cervical lordosis. This may be secondary to splinting, soft tissue injury, or positioning. There is stable anterolisthesis of C2 on C3, C3 on C4, and C4 on C5. Skull base and vertebrae: No acute fracture. Disc spaces: Significant degenerative changes are identified throughout the cervical spine. Soft tissues and spinal canal: No acute abnormality. UPPER chest: No acute abnormality. Other: Carotid calcifications bilaterally. IMPRESSION: Significant stable degenerative changes throughout the cervical spine. No evidence for acute abnormality. Electronically Signed   By: Nolon Nations M.D.   On: 03/17/2019 22:19   Result Date: 03/17/2019 CLINICAL DATA:  Pt found by the maintenance man. Last seen by family on Monday. Pt altered level of consciousness. Hypertension and strong smell of amnionia EXAM: CT HEAD WITHOUT CONTRAST; CT CERVICAL SPINE WITHOUT CONTRAST TECHNIQUE: Contiguous axial images were obtained from the base of the skull through the vertex without intravenous contrast. COMPARISON:  07/01/2018  FINDINGS: Brain: Significant blood identified within the LATERAL ventricles, LEFT greater than RIGHT. Blood is also identified in the third and fourth ventricles, layering dependently. There is parenchymal hemorrhage within the frontal lobe periventricular white matter, adjacent to the body of the LEFT LATERAL ventricle and contiguous with the intraventricular blood. This focus of hemorrhage is estimated to measure 9 x 9 millimeters. There is central and cortical atrophy. Periventricular white matter changes are consistent with small vessel disease. Vascular: There is atherosclerotic calcification of the internal carotid arteries. No hyperdense vessels. Skull: Normal. Negative for fracture or focal lesion. Sinuses/Orbits: No acute finding. Other: 11 significant scalp1 edema and hematoma along the vertex, not associated with underlying fracture. IMPRESSION: 1. Large intraventricular hemorrhage with ventricular dilatation. 2. 9 millimeter intraparenchymal hemorrhage within the frontal lobe periventricular white matter, adjacent to the body of the LEFT LATERAL ventricle. 3. Atrophy and small vessel disease. 4. Significant scalp edema and hematoma along the vertex, not associated with underlying fracture. Critical Value/emergent results were called by telephone at the time of interpretation on 03/17/2019 at 7:00 pm to [facility] , who verbally acknowledged these results. Electronically Signed: By: Nolon Nations M.D. On: 03/17/2019 19:06   Ct Angio Neck W Or Wo Contrast  Result Date: 03/18/2019 CLINICAL DATA:  Follow-up intracranial hemorrhage EXAM: CT ANGIOGRAPHY HEAD AND NECK TECHNIQUE: Multidetector CT imaging of the head and neck was performed using the standard protocol during bolus administration of intravenous contrast. Multiplanar CT image reconstructions and MIPs were obtained to evaluate the vascular anatomy. Carotid stenosis measurements (  when applicable) are obtained utilizing NASCET  criteria, using the distal internal carotid diameter as the denominator. CONTRAST:  14mL OMNIPAQUE IOHEXOL 350 MG/ML SOLN COMPARISON:  Head CT yesterday. FINDINGS: CTA NECK FINDINGS Aortic arch: Pronounced aortic atherosclerosis. Branching pattern is normal without origin stenosis. Right carotid system: Common carotid artery widely patent to the bifurcation. Calcified plaque at the carotid bifurcation and ICA bulb. Minimal diameter of the proximal ICA measures 2 mm. Compared to a more distal cervical ICA diameter of 5 mm, this indicates a 60% stenosis. Cervical ICA widely patent beyond that. Left carotid system: Common carotid artery is patent to the bifurcation region. Extensive calcified plaque at the carotid bifurcation and ICA bulb. Minimal diameter on this side is also 2 mm. Compared to a more distal cervical ICA diameter of 5 mm, this indicates a 60% stenosis. Vertebral arteries: Left vertebral artery is dominant. Both vertebral artery origins show calcified plaque with estimated stenosis of 30-50%. Beyond that, both vertebral arteries are patent through the cervical region to the foramen magnum. Skeleton: Chronic degenerative spondylosis. Old partial compression fractures at T3 and T4. Other neck: No mass or lymphadenopathy.  Mild thyroid goiter. Upper chest: Negative Review of the MIP images confirms the above findings CTA HEAD FINDINGS Anterior circulation: Both internal carotid arteries are patent through the skull base and siphon regions. There is siphon atherosclerotic calcification but without stenosis greater than 50%. Anterior and middle cerebral vessels are patent without proximal stenosis, aneurysm vascular malformation. In the region of the left deep brain hemorrhage, no abnormal vascular structure is identified. Posterior circulation: Atherosclerotic disease of both vertebral artery V4 segments. Stenosis on the right is severe, 70% or greater. Stenosis on the left is moderate, 50-70%. Both vessels  reach the basilar. No basilar stenosis is seen. Posterior circulation branch vessels are patent. Venous sinuses: Patent and normal. Anatomic variants: None significant. Review of the MIP images confirms the above findings IMPRESSION: No evidence of underlying vascular lesion in the region of the deep brain hemorrhage on the left. Advanced aortic atherosclerosis. Atherosclerotic disease at both carotid bifurcations with maximal stenosis of 60% on each side. 30-50% stenosis at both vertebral artery origins. 50% or less stenosis in both carotid siphon regions. 70% or greater stenosis of the right vertebral artery V4 segment. 50-70% stenosis of the left vertebral artery V4 segment. Electronically Signed   By: Nelson Chimes M.D.   On: 03/18/2019 10:29   Ct Cervical Spine Wo Contrast  Addendum Date: 03/17/2019   ADDENDUM REPORT: 03/17/2019 22:19 ADDENDUM: Cervical spine: Comparison 10/16/2018 Alignment: There is loss of cervical lordosis. This may be secondary to splinting, soft tissue injury, or positioning. There is stable anterolisthesis of C2 on C3, C3 on C4, and C4 on C5. Skull base and vertebrae: No acute fracture. Disc spaces: Significant degenerative changes are identified throughout the cervical spine. Soft tissues and spinal canal: No acute abnormality. UPPER chest: No acute abnormality. Other: Carotid calcifications bilaterally. IMPRESSION: Significant stable degenerative changes throughout the cervical spine. No evidence for acute abnormality. Electronically Signed   By: Nolon Nations M.D.   On: 03/17/2019 22:19   Result Date: 03/17/2019 CLINICAL DATA:  Pt found by the maintenance man. Last seen by family on Monday. Pt altered level of consciousness. Hypertension and strong smell of amnionia EXAM: CT HEAD WITHOUT CONTRAST; CT CERVICAL SPINE WITHOUT CONTRAST TECHNIQUE: Contiguous axial images were obtained from the base of the skull through the vertex without intravenous contrast. COMPARISON:   07/01/2018 FINDINGS: Brain: Significant blood  identified within the LATERAL ventricles, LEFT greater than RIGHT. Blood is also identified in the third and fourth ventricles, layering dependently. There is parenchymal hemorrhage within the frontal lobe periventricular white matter, adjacent to the body of the LEFT LATERAL ventricle and contiguous with the intraventricular blood. This focus of hemorrhage is estimated to measure 9 x 9 millimeters. There is central and cortical atrophy. Periventricular white matter changes are consistent with small vessel disease. Vascular: There is atherosclerotic calcification of the internal carotid arteries. No hyperdense vessels. Skull: Normal. Negative for fracture or focal lesion. Sinuses/Orbits: No acute finding. Other: 11 significant scalp1 edema and hematoma along the vertex, not associated with underlying fracture. IMPRESSION: 1. Large intraventricular hemorrhage with ventricular dilatation. 2. 9 millimeter intraparenchymal hemorrhage within the frontal lobe periventricular white matter, adjacent to the body of the LEFT LATERAL ventricle. 3. Atrophy and small vessel disease. 4. Significant scalp edema and hematoma along the vertex, not associated with underlying fracture. Critical Value/emergent results were called by telephone at the time of interpretation on 03/17/2019 at 7:00 pm to [facility] , who verbally acknowledged these results. Electronically Signed: By: Nolon Nations M.D. On: 03/17/2019 19:06   Mr Brain Wo Contrast  Result Date: 03/19/2019 CLINICAL DATA:  85 year old female with acute intracranial hemorrhage. EXAM: MRI HEAD WITHOUT CONTRAST TECHNIQUE: Multiplanar, multiecho pulse sequences of the brain and surrounding structures were obtained without intravenous contrast. COMPARISON:  CT head and CTA head and neck recently. Brain MRI 02/04/2016. FINDINGS: Brain: Stable large volume intraventricular hemorrhage from the recent CTs. A small volume  of superimposed subarachnoid hemorrhage layering posteriorly has increased (series 11, image 15), especially in the posterior fossa along the cerebellar convexities (series 11, image 9). No definite subdural space hematoma. As suspected previously, there does appear to be a tiny intra-axial focus of blood in the left periventricular white matter on series 14, image 14. Stable ventricle size and configuration. Up to mild transependymal edema. Basilar cisterns remain patent. Trace rightward midline shift. No superimposed restricted diffusion to suggest acute infarct. There is subependymal susceptibility on DWI. Chronic Patchy and confluent cerebral white matter T2 and FLAIR hyperintensity has increased since 2017. The scattered subarachnoid blood limits the evaluation for underlying chronic microhemorrhage today. Negative pituitary.  Stable cervicomedullary junction. Vascular: Major intracranial vascular flow voids are stable since 2017. Dominant left vertebral artery. Skull and upper cervical spine: Advanced cervical spine degeneration with spinal stenosis beginning at the C2 level, may have progressed since 2017. Bone marrow signal is within normal limits. Sinuses/Orbits: Negative orbits and paranasal sinuses. Other: Lobulated vertex scalp hematoma on series 9, image 12. Mastoids are well pneumatized. IMPRESSION: 1. Stable large volume of intraventricular hemorrhage, although superimposed subarachnoid hemorrhage volume appears increased from the CT on 03/17/2019 suggesting a degree of continued bleeding. 2. Stable ventriculomegaly, with mild transependymal edema. No significant midline shift. Basilar cisterns remain patent. 3. As before, the hemorrhage might have originated in the left periventricular white matter. No superimposed acute infarct. No underlying lesion is identified in the absence of IV contrast. 4. Scalp hematoma at the vertex re-identified. Electronically Signed   By: Genevie Ann M.D.   On: 03/19/2019  00:35   Dg Pelvis Portable  Result Date: 03/17/2019 CLINICAL DATA:  Recent fall with pelvic pain, initial encounter EXAM: PORTABLE PELVIS 1-2 VIEWS COMPARISON:  None. FINDINGS: Pelvic ring is intact. Degenerative changes of the lumbar spine and hip joints are seen. Diffuse vascular calcifications are noted. No acute bony abnormality is noted. IMPRESSION: No acute  abnormality noted. Electronically Signed   By: Inez Catalina M.D.   On: 03/17/2019 19:09   Dg Chest Port 1 View  Result Date: 03/17/2019 CLINICAL DATA:  Altered level of consciousness EXAM: PORTABLE CHEST 1 VIEW COMPARISON:  Radiograph 10/20/2018, CT 09/17/2016 FINDINGS: Cardiomegaly and central venous congestion, similar to prior. Suspect mild interstitial edema as well with hazy interstitial opacities and indistinct cephalized vascularity. No focal consolidative opacity. No pneumothorax or effusion. The aorta is calcified. The remaining cardiomediastinal contours are unremarkable. Degenerative changes are present in the spine and shoulders including a high-riding appearance of the right humeral head likely reflecting chronic rotator cuff insufficiency. IMPRESSION: Cardiomegaly with mild interstitial edema. Electronically Signed   By: Lovena Le M.D.   On: 03/17/2019 19:08    Assessment/Plan: Patient with small ICH with associated IVH, but no hydrocephalus.  No for further neurosurgical follow-up, we will sign off case.  Hosie Spangle, MD 03/19/2019, 6:15 PM

## 2019-03-19 NOTE — Evaluation (Signed)
Occupational Therapy Evaluation Patient Details Name: Jane [name] MRN: [id number] DOB: [birth year] Today's Date: 03/19/2019    History of Present Illness 85 y.o. female admitted on 03/18/19 for L frontal periventricular hemorrhage, L IVH secondary to uncontrolled HTN.  Pt with significant PMH of stroke, HTN, CKD, CHF, CAD, anemia.   Clinical Impression   PT admitted with L IVH. Pt currently with functional limitiations due to the deficits listed below (see OT problem list). Pt currently total (A) for all aspects  Of adls. Pt demonstrates eucholia when responding and not following commands.  Pt will benefit from skilled OT to increase their independence and safety with adls and balance to allow discharge SNF.     Follow Up Recommendations  SNF    Equipment Recommendations  Wheelchair (measurements OT);Wheelchair cushion (measurements OT);Hospital bed    Recommendations for Other Services Other (comment)(palliative )     Precautions / Restrictions Precautions Precautions: Fall      Mobility Bed Mobility Overal bed mobility: Needs Assistance Bed Mobility: Supine to Sit;Sit to Supine     Supine to sit: +2 for physical assistance;Total assist Sit to supine: +2 for physical assistance;Total assist   General bed mobility comments: Two person total assist for mobility to EOB and back to supine.   Transfers                 General transfer comment: not assessed due to arousal and following commands    Balance Overall balance assessment: Needs assistance Sitting-balance support: Feet supported;No upper extremity supported Sitting balance-Leahy Scale: Poor Sitting balance - Comments: mod assist EOB, some slow, delayed balance reactions with limited trunk firing.  Postural control: Posterior lean;Right lateral lean                                 ADL either performed or assessed with clinical judgement   ADL Overall ADL's : Needs assistance/impaired                                       General ADL Comments: total (A) for all aspects     Vision   Additional Comments: noted to hav eblood weeping from L eye     Perception     Praxis      Pertinent Vitals/Pain Pain Assessment: Faces Faces Pain Scale: Hurts even more Pain Location: grimacing with return to supine from sitting (possible back pain) Pain Descriptors / Indicators: Grimacing;Guarding Pain Intervention(s): Premedicated before session;Repositioned;Monitored during session     Hand Dominance Right   Extremity/Trunk Assessment Upper Extremity Assessment Upper Extremity Assessment: RUE deficits/detail;LUE deficits/detail RUE Deficits / Details: tone noted holding in a wrist flexed position. montior for splint needs. Rn reports taht during peri care utilized R UE. noted to have shoulder shrug LUE Deficits / Details: AROM noted of all aspects of arm but not functionally using. pt holding in a extended positoin when staff attempting to range   Lower Extremity Assessment Lower Extremity Assessment: Defer to PT evaluation RLE Deficits / Details: bil LE with spontaneous movement at times, but also very rigid with seemingly increased tone bil.  LLE Deficits / Details: bil LE with spontaneous movement at times, but also very rigid with seemingly increased tone bil.    Cervical / Trunk Assessment Cervical / Trunk Assessment: Kyphotic   Communication Communication Communication: [facility]  Cognition Arousal/Alertness: Lethargic Behavior During Therapy: Flat affect Overall Cognitive Status: Impaired/Different from baseline Area of Impairment: Orientation;Attention;Memory;Following commands;Safety/judgement;Awareness;Problem solving                 Orientation Level: Disoriented to;Person;Place;Time;Situation Current Attention Level: Focused Memory: Decreased recall of precautions;Decreased short-term memory Following Commands: Follows one step commands  inconsistently Safety/Judgement: Decreased awareness of safety;Decreased awareness of deficits Awareness: Intellectual Problem Solving: Slow processing;Decreased initiation;Difficulty sequencing;Requires verbal cues;Requires tactile cues General Comments: Pt did not follow commads, responded to her name being called, and mumbling, echoing what PT was asking her at times, but often incoherant. pt states "open your eyes" multiple times in response to PT [name].     General Comments  noted to have very easily bruised skin and bleeding from L UE iv site and L eye HR 125 -120 throughout session RA 98% RA    Exercises     Shoulder Instructions      Home Living Family/patient expects to be discharged to:: Private residence Living Arrangements: Alone Available Help at Discharge: Family;Available PRN/intermittently Type of Home: Apartment Home Access: Elevator     Home Layout: One level     Bathroom Shower/Tub: Teacher, early years/pre: Standard Bathroom Accessibility: Yes   Home Equipment: Environmental consultant - 2 wheels;Tub bench;Grab bars - tub/shower;Grab bars - toilet   Additional Comments: Nephew provides some meals and checks in on her intermittently. He takes her for errands.       Prior Functioning/Environment Level of Independence: Needs assistance  Gait / Transfers Assistance Needed: Furniture walks in apartment, uses RW for community mobility ADL's / Homemaking Assistance Needed: pt reports sponge bathing, manages medication indepedently and completes light meal mgmt    Comments: per previous chart review she has aids        OT Problem List: Decreased strength;Decreased activity tolerance;Impaired balance (sitting and/or standing);Impaired vision/perception;Decreased coordination;Decreased cognition;Decreased safety awareness;Decreased knowledge of use of DME or AE;Decreased knowledge of precautions;Cardiopulmonary status limiting activity;Obesity;Impaired UE functional  use;Pain      OT Treatment/Interventions: Self-care/ADL training;Therapeutic exercise;Neuromuscular education;Energy conservation;DME and/or AE instruction;Manual therapy;Modalities;Therapeutic activities;Splinting;Cognitive remediation/compensation;Visual/perceptual remediation/compensation;Patient/family education;Balance training    OT Goals(Current goals can be found in the care plan section) Acute Rehab OT Goals Patient Stated Goal: unable to state OT Goal Formulation: Patient unable to participate in goal setting Time For Goal Achievement: 04/02/19 Potential to Achieve Goals: Good  OT Frequency: Min 2X/week   Barriers to D/C: Decreased caregiver support  lives alone       Co-evaluation PT/OT/SLP Co-Evaluation/Treatment: Yes Reason for Co-Treatment: Complexity of the patient's impairments (multi-system involvement);Necessary to address cognition/behavior during functional activity;For patient/therapist safety;To address functional/ADL transfers PT goals addressed during session: Mobility/safety with mobility;Balance;Strengthening/ROM OT goals addressed during session: ADL's and self-care;Proper use of Adaptive equipment and DME;Strengthening/ROM      AM-PAC OT "6 Clicks" Daily Activity     Outcome Measure Help from another person eating meals?: Total Help from another person taking care of personal grooming?: Total Help from another person toileting, which includes using toliet, bedpan, or urinal?: Total Help from another person bathing (including washing, rinsing, drying)?: Total Help from another person to put on and taking off regular upper body clothing?: Total Help from another person to put on and taking off regular lower body clothing?: Total 6 Click Score: 6   End of Session Nurse Communication: Mobility status;Precautions  Activity Tolerance: Patient tolerated treatment well Patient left: in bed;with call bell/phone within reach;with bed alarm set;with  nursing/sitter in room  OT Visit Diagnosis: Unsteadiness on feet (R26.81);Muscle weakness (generalized) (M62.81)                Time: [id number] OT Time Calculation (min): 27 min Charges:  OT General Charges $OT Visit: 1 Visit OT Evaluation $OT Eval Moderate Complexity: 1 Mod   Jeri Modena, OTR/L  Acute Rehabilitation Services Pager: [phone] Office: [phone] .   Jeri Modena 03/19/2019, 2:46 PM

## 2019-03-19 NOTE — Progress Notes (Signed)
Initial Nutrition Assessment  DOCUMENTATION CODES:   Not applicable  INTERVENTION:   Initiate Jevity 1.2 @ 20 ml/hr and increase by 10 ml every 4 hours to goal rate of 50 ml/hr via Cortrak tube 30 ml Prostat daily  Provides: 1540 kcal, 81 grams protein, and 973 ml free water.    NUTRITION DIAGNOSIS:   Inadequate oral intake related to inability to eat as evidenced by NPO status.  GOAL:   Patient will meet greater than or equal to 90% of their needs  MONITOR:   TF tolerance  REASON FOR ASSESSMENT:   Consult Enteral/tube feeding initiation and management  ASSESSMENT:   Pt with PMH of HTN, CKD stage III, CHF, CAD, HLD, and vitamin D deficiency who lives alone admitted after being found down at home after 2 days with L IVH due to uncontrolled HTN.    9/18 failed slp eval, cortrak placed (tip in stomach)   Pt unable to answer any questions.  Pt discussed during ICU rounds and with RN.   Medications reviewed and include: senokot-s Cleviprex @ 12 ml/hr provides: 576 kcal  Labs reviewed    NUTRITION - FOCUSED PHYSICAL EXAM:    Most Recent Value  Orbital Region  Mild depletion  Upper Arm Region  No depletion  Thoracic and Lumbar Region  No depletion  Buccal Region  Mild depletion  Temple Region  Mild depletion  Clavicle Bone Region  No depletion  Clavicle and Acromion Bone Region  No depletion  Scapular Bone Region  Unable to assess  Dorsal Hand  No depletion [unable to determine activity level]  Patellar Region  No depletion  Anterior Thigh Region  Mild depletion  Posterior Calf Region  Mild depletion  Edema (RD Assessment)  None  Hair  Reviewed  Eyes  Reviewed  Mouth  Reviewed  Skin  Reviewed  Nails  Reviewed       Diet Order:   Diet Order            Diet NPO time specified  Diet effective now              EDUCATION NEEDS:   No education needs have been identified at this time  Skin:  Skin Assessment: (stage I sacrum)  Last BM:   unknown  Height:   Ht Readings from Last 1 Encounters:  03/19/19 5' (1.524 m)    Weight:   Wt Readings from Last 1 Encounters:  10/20/18 68 kg    Ideal Body Weight:  45.4 kg  BMI:  Body mass index is 29.29 kg/m.  Estimated Nutritional Needs:   Kcal:  1500-1700  Protein:  75-85 grams  Fluid:  >1.5 L/day  Maylon Peppers RD, LDN, CNSC [phone] Pager [phone] After Hours Pager

## 2019-03-19 NOTE — Procedures (Signed)
Cortrak  Person Inserting Tube:  Brielynn Sekula, RD Tube Type:  Cortrak - 43 inches Tube Location:  Right nare Initial Placement:  Stomach Secured by: Bridle Technique Used to Measure Tube Placement:  Documented cm marking at nare/ corner of mouth Cortrak Secured At:  58 cm   No x-ray is required. RN may begin using tube.   If the tube becomes dislodged please keep the tube and contact the Cortrak team at [email or web address] (password [email or web address]) for replacement.  If after hours and replacement cannot be delayed, place a NG tube and confirm placement with an abdominal x-ray.    Mariana Single RD, LDN Clinical Nutrition Pager # [phone]

## 2019-03-20 ENCOUNTER — Inpatient Hospital Stay (HOSPITAL_COMMUNITY): Payer: Medicare Other

## 2019-03-20 DIAGNOSIS — I615 Nontraumatic intracerebral hemorrhage, intraventricular: Principal | ICD-10-CM

## 2019-03-20 DIAGNOSIS — G936 Cerebral edema: Secondary | ICD-10-CM

## 2019-03-20 DIAGNOSIS — I619 Nontraumatic intracerebral hemorrhage, unspecified: Secondary | ICD-10-CM

## 2019-03-20 LAB — URINALYSIS, ROUTINE W REFLEX MICROSCOPIC
Bilirubin Urine: NEGATIVE
Glucose, UA: NEGATIVE mg/dL
Ketones, ur: NEGATIVE mg/dL
Nitrite: NEGATIVE
Protein, ur: >=300 mg/dL — AB
Specific Gravity, Urine: 1.016 (ref 1.005–1.030)
pH: 6 (ref 5.0–8.0)

## 2019-03-20 LAB — CBC
HCT: 30.5 % — ABNORMAL LOW (ref 36.0–46.0)
Hemoglobin: 10.5 g/dL — ABNORMAL LOW (ref 12.0–15.0)
MCH: 33.7 pg (ref 26.0–34.0)
MCHC: 34.4 g/dL (ref 30.0–36.0)
MCV: 97.8 fL (ref 80.0–100.0)
Platelets: 80 10*3/uL — ABNORMAL LOW (ref 150–400)
RBC: 3.12 MIL/uL — ABNORMAL LOW (ref 3.87–5.11)
RDW: 13.9 % (ref 11.5–15.5)
WBC: 6.6 10*3/uL (ref 4.0–10.5)
nRBC: 0 % (ref 0.0–0.2)

## 2019-03-20 LAB — BASIC METABOLIC PANEL
Anion gap: 11 (ref 5–15)
BUN: 31 mg/dL — ABNORMAL HIGH (ref 8–23)
CO2: 17 mmol/L — ABNORMAL LOW (ref 22–32)
Calcium: 8.1 mg/dL — ABNORMAL LOW (ref 8.9–10.3)
Chloride: 115 mmol/L — ABNORMAL HIGH (ref 98–111)
Creatinine, Ser: 1.05 mg/dL — ABNORMAL HIGH (ref 0.44–1.00)
GFR calc Af Amer: 55 mL/min — ABNORMAL LOW (ref >60)
GFR calc non Af Amer: 47 mL/min — ABNORMAL LOW (ref >60)
Glucose, Bld: 146 mg/dL — ABNORMAL HIGH (ref 70–99)
Potassium: 3.5 mmol/L (ref 3.5–5.1)
Sodium: 143 mmol/L (ref 135–145)

## 2019-03-20 LAB — GLUCOSE, CAPILLARY
Glucose-Capillary: 128 mg/dL — ABNORMAL HIGH (ref 70–99)
Glucose-Capillary: 114 mg/dL — ABNORMAL HIGH (ref 70–99)
Glucose-Capillary: 114 mg/dL — ABNORMAL HIGH (ref 70–99)
Glucose-Capillary: 108 mg/dL — ABNORMAL HIGH (ref 70–99)
Glucose-Capillary: 114 mg/dL — ABNORMAL HIGH (ref 70–99)

## 2019-03-20 LAB — URINE CULTURE: Culture: NO GROWTH

## 2019-03-20 MED ORDER — PANTOPRAZOLE SODIUM 40 MG PO PACK
40.0000 mg | PACK | Freq: Every day | ORAL | Status: DC
  Administered 2019-03-20 – 2019-03-27 (×8): 40 mg
  Filled 2019-03-20 (×7): qty 20

## 2019-03-20 MED ORDER — SENNOSIDES-DOCUSATE SODIUM 8.6-50 MG PO TABS
1.0000 | ORAL_TABLET | Freq: Two times a day (BID) | ORAL | Status: DC
  Administered 2019-03-20 – 2019-03-27 (×14): 1
  Filled 2019-03-20 (×14): qty 1

## 2019-03-20 MED ORDER — LOSARTAN POTASSIUM 50 MG PO TABS
50.0000 mg | ORAL_TABLET | Freq: Two times a day (BID) | ORAL | Status: DC
  Administered 2019-03-20 – 2019-03-27 (×14): 50 mg
  Filled 2019-03-20 (×14): qty 1

## 2019-03-20 MED ORDER — FUROSEMIDE 20 MG PO TABS
20.0000 mg | ORAL_TABLET | Freq: Every day | ORAL | Status: DC
  Administered 2019-03-20 – 2019-03-27 (×8): 20 mg via ORAL
  Filled 2019-03-20 (×8): qty 1

## 2019-03-20 MED ORDER — LOSARTAN POTASSIUM 50 MG PO TABS: 100.0000 mg | ORAL_TABLET | Freq: Every day | ORAL | Status: DC

## 2019-03-20 MED ORDER — HEPARIN SODIUM (PORCINE) 5000 UNIT/ML IJ SOLN
5000.0000 [IU] | Freq: Three times a day (TID) | INTRAMUSCULAR | Status: DC
  Administered 2019-03-20 – 2019-03-24 (×12): 5000 [IU] via SUBCUTANEOUS
  Filled 2019-03-20 (×12): qty 1

## 2019-03-20 MED ORDER — LOSARTAN POTASSIUM 50 MG PO TABS: 50.0000 mg | ORAL_TABLET | Freq: Two times a day (BID) | ORAL | Status: DC

## 2019-03-20 MED ORDER — SODIUM CHLORIDE 0.9 % IV SOLN
2.0000 g | INTRAVENOUS | Status: DC
  Administered 2019-03-20 – 2019-03-22 (×3): 2 g via INTRAVENOUS
  Filled 2019-03-20 (×3): qty 2
  Filled 2019-03-20: qty 20

## 2019-03-20 MED ORDER — HYPROMELLOSE (GONIOSCOPIC) 2.5 % OP SOLN
1.0000 [drp] | Freq: Four times a day (QID) | OPHTHALMIC | Status: DC
  Administered 2019-03-20 – 2019-03-27 (×29): 1 [drp] via OPHTHALMIC
  Filled 2019-03-20: qty 15

## 2019-03-20 NOTE — Evaluation (Signed)
Speech Language Pathology Evaluation Patient Details Name: Jane [name] MRN: [id number] DOB: 13-May-1929 Today's Date: 03/20/2019 Time: [id number] SLP Time Calculation (min) (ACUTE ONLY): 12 min  Problem List:  Patient Active Problem List   Diagnosis Date Noted  . IVH (intraventricular hemorrhage) ([place]) 03/19/2019  . Cytotoxic brain edema ([place]) 03/19/2019  . Pressure injury of skin 03/18/2019  . Stroke (cerebrum) ([place]) 03/17/2019  . Urge incontinence 11/18/2018  . Acute exacerbation of CHF (congestive heart failure) ([place]) 10/20/2018  . Acute on chronic heart failure with preserved ejection fraction (HFpEF) ([place]) 07/25/2018  . Hypokalemia 07/25/2018  . Cellulitis of left lower extremity   . Toenail deformity 03/19/2018  . Skin lesion of scalp 03/19/2018  . CAD (coronary artery disease) 08/25/2017  . Unsteady gait 08/11/2016  . Abdominal aortic aneurysm ([place]) 05/21/2016  . Trochanteric bursitis of left hip   . Pancytopenia ([place]) 12/27/2015  . CKD (chronic kidney disease) 03/31/2015  . Prediabetes 10/16/2014  . Status post insertion of drug-eluting stent into right coronary artery for coronary artery disease 08/07/2014  . SOB (shortness of breath) 05/31/2014  . Physical deconditioning 05/31/2014  . Heart failure with preserved ejection fraction ([place]) 12/27/2012  . Ectropion of left lower eyelid 12/17/2012  . Onychomycosis of toenail 09/18/2012  . PAD (peripheral artery disease) ([place]) 09/18/2012  . Preventative health care 09/18/2012  . Vitamin D deficiency 04/25/2008  . History of cerebrovascular accident 10/29/2007  . Hyperlipidemia 09/24/2006  . Normocytic anemia 09/24/2006  . Hypertension 09/24/2006  . OSTEOPENIA 09/24/2006  . Vitamin B12 deficiency 08/29/2006   Past Medical History:  Past Medical History:  Diagnosis Date  . Anemia   . Aortic insufficiency    mild  . Arthritis    "arms" (08/09/2015)  . CAD (coronary artery disease)    cath 08/09/2014 95% stenosis in  prox to mid RCA s/p DES, 80-90% prox OM2, 50% distal LAD  . CHF (congestive heart failure) ([place])   . CKD (chronic kidney disease) stage 3, GFR 30-59 ml/min ([facility])   . Colon polyp    2009 colonoscopy, not retrieved for pathology  . Dyspnea   . Ectropion of left lower eyelid   . GERD (gastroesophageal reflux disease) 2009   EGD with benign gastric polyp too  . Hyperlipidemia   . Hypertension   . Pneumonia    history of  . Stroke ([place]) 1975  . Vitamin D deficiency    Past Surgical History:  Past Surgical History:  Procedure Laterality Date  . ABDOMINAL HYSTERECTOMY    . APPENDECTOMY    . ARTERY BIOPSY Left 12/16/2012   Procedure: BIOPSY TEMPORAL ARTERY;  Surgeon: Mal Misty, MD;  Location: [facility];  Service: Vascular;  Laterality: Left;  . CARDIAC CATHETERIZATION    . LEFT HEART CATHETERIZATION WITH CORONARY ANGIOGRAM N/A 08/09/2014   Procedure: LEFT HEART CATHETERIZATION WITH CORONARY ANGIOGRAM;  Surgeon: Peter M Martinique, MD;  Location: [facility] CATH LAB;  Service: Cardiovascular;  Laterality: N/A;  . PERCUTANEOUS CORONARY STENT INTERVENTION (PCI-S)  08/09/2014   Procedure: PERCUTANEOUS CORONARY STENT INTERVENTION (PCI-S);  Surgeon: Peter M Martinique, MD;  Location: [facility] CATH LAB;  Service: Cardiovascular;;  . TONSILLECTOMY     HPI:  Jane [name] is a 85 y.o. female with a past medical history of stroke, GERD, pna, ectropion of left lower eyelid, hypertension, hyperlipidemia, CHF presents today after being found on the ground by maintenance. MRI A small volume of superimposed subarachnoid hemorrhage layering posteriorly has increased especially in the posterior fossa  along the cerebellar convexities. The hemorrhage might have originated in the left periventricular white matter.   Assessment / Plan / Recommendation Clinical Impression  Pt required repetition, additional response time and tactile cues to maximize sensory input given hearing and visual disturbances. She appeared to have more timely  responses when speech directed toward her left ear and was able to follow one step directions. Verbal expression was a combination of appropriate and intentional language, perseverations on words/thoughts and echolalia. She was oriented to person, place and not to situation and asked therapist what month it was and perseverated on this. She requires assist for basic problem solving related to her environment. Imprecise articulation noted. Pt's premorbid speech-language-cognitive abilities unknown at this time. Will continue to assess cognition in diagnostic treatment.    SLP Assessment  SLP Recommendation/Assessment: Patient needs continued Speech Lanaguage Pathology Services SLP Visit Diagnosis: Cognitive communication deficit (R41.841);Dysarthria and anarthria (R47.1)    Follow Up Recommendations  Skilled Nursing facility    Frequency and Duration min 2x/week  2 weeks      SLP Evaluation Cognition  Overall Cognitive Status: Impaired/Different from baseline Arousal/Alertness: Awake/alert Orientation Level: Oriented to person;Oriented to place;Disoriented to time;Disoriented to situation Attention: Sustained Sustained Attention: Appears intact Memory: (will assess further) Awareness: Impaired Awareness Impairment: Intellectual impairment;Emergent impairment;Anticipatory impairment Problem Solving: Impaired Problem Solving Impairment: Functional basic Behaviors: Perseveration Safety/Judgment: Impaired       Comprehension  Auditory Comprehension Overall Auditory Comprehension: Other (comment)(followed simple commands with extra time) Commands: (followed simple commands with extra time) Interfering Components: Motor planning;Other (comment);Visual impairments(hard of hearing) Visual Recognition/Discrimination Discrimination: Not tested Reading Comprehension Reading Status: Not tested    Expression Expression Primary Mode of Expression: Verbal Verbal Expression Overall Verbal  Expression: Impaired Initiation: Impaired Level of Generative/Spontaneous Verbalization: Sentence Repetition: (NT) Naming: Not tested(no VCN, eyes closed) Pragmatics: No impairment Written Expression Dominant Hand: Right Written Expression: Not tested   Oral / Motor  Oral Motor/Sensory Function Overall Oral Motor/Sensory Function: Generalized oral weakness Facial Sensation: (suspect decreased) Motor Speech Overall Motor Speech: Impaired Respiration: Within functional limits Phonation: Normal Resonance: Within functional limits Articulation: Within functional limitis Intelligibility: Intelligibility reduced Word: 75-100% accurate Phrase: 75-100% accurate Sentence: 75-100% accurate Motor Planning: Witnin functional limits                       Houston Siren 03/20/2019, 5:08 PM   Orbie Pyo Danna Casella M.Ed Risk analyst [phone] Office [phone]

## 2019-03-20 NOTE — Progress Notes (Signed)
STROKE TEAM PROGRESS NOTE   INTERVAL HISTORY Pt more lethargic, less interactive and increased right UE weakness. Will not be able to swallow today. Will reach out to family to discuss DNR.  MRI shows stable IVH but increase in subarachnoid component of blood. No hydrocephalous.    Vitals:   03/20/19 0700 03/20/19 0800 03/20/19 0900 03/20/19 1000  BP: 140/74 (!) 156/79 (!) 152/73 134/72  Pulse: 87 91 89 92  Resp: 19 20 20  (!) 23  Temp:  99.9 F (37.7 C)    TempSrc:  Axillary    SpO2: 98% 96% 97% 97%  Height:        CBC:  Recent Labs  Lab 03/17/19 1811 03/19/19 0621 03/20/19 0545  WBC 8.4 9.0 6.6  NEUTROABS 7.3  --   --   HGB 13.3 11.6* 10.5*  HCT 40.0 32.8* 30.5*  MCV 98.5 96.5 97.8  PLT 122* 93* 80*    Basic Metabolic Panel:  Recent Labs  Lab 03/17/19 1922 03/19/19 0621 03/20/19 0545  NA  --  143 143  K  --  3.8 3.5  CL  --  119* 115*  CO2  --  14* 17*  GLUCOSE  --  96 146*  BUN  --  31* 31*  CREATININE  --  1.18* 1.05*  CALCIUM  --  8.5* 8.1*  MG 1.7  --   --    Lipid Panel:     Component Value Date/Time   CHOL 185 03/19/2019 0621   CHOL 140 12/31/2016 1409   TRIG 146 03/19/2019 0621   HDL 43 03/19/2019 0621   HDL 61 12/31/2016 1409   CHOLHDL 4.3 03/19/2019 0621   VLDL 29 03/19/2019 0621   LDLCALC 113 (H) 03/19/2019 0621   LDLCALC 68 12/31/2016 1409   HgbA1c:  Lab Results  Component Value Date   HGBA1C 5.7 (H) 06/01/2014   Urine Drug Screen: No results found for: LABOPIA, COCAINSCRNUR, LABBENZ, AMPHETMU, THCU, LABBARB  Alcohol Level     Component Value Date/Time   ETH <10 03/17/2019 1925    IMAGING  Ct Head Wo Contrast 03/17/2019   1. Large intraventricular hemorrhage with ventricular dilatation. 2. 9 millimeter intraparenchymal hemorrhage within the frontal lobe periventricular white matter, adjacent to the body of the LEFT LATERAL ventricle. 3. Atrophy and small vessel disease. 4. Significant scalp edema and hematoma along the vertex,  not associated with underlying fracture.   Ct Cervical Spine Wo Contrast 03/17/2019   Significant stable degenerative changes throughout the cervical spine. No evidence for acute abnormality.   Ct Angio Head W Or Wo Contrast Ct Angio Neck W Or Wo Contrast 03/18/2019 No evidence of underlying vascular lesion in the region of the deep brain hemorrhage on the left. Advanced aortic atherosclerosis. Atherosclerotic disease at both carotid bifurcations with maximal stenosis of 60% on each side. 30-50% stenosis at both vertebral artery origins. 50% or less stenosis in both carotid siphon regions. 70% or greater stenosis of the right vertebral artery V4 segment. 50-70% stenosis of the left vertebral artery V4 segment.   Mr Brain Wo Contrast 03/19/2019 1. Stable large volume of intraventricular hemorrhage, although superimposed subarachnoid hemorrhage volume appears increased from the CT on 03/17/2019 suggesting a degree of continued bleeding.  2. Stable ventriculomegaly, with mild transependymal edema. No significant midline shift. Basilar cisterns remain patent.  3. As before, the hemorrhage might have originated in the left periventricular white matter. No superimposed acute infarct. No underlying lesion is identified in the absence of  IV contrast.  4. Scalp hematoma at the vertex re-identified.    2D Echocardiogram  1. Left ventricular ejection fraction, by visual estimation, is 60 to 65%. The left ventricle has normal function. Normal left ventricular size. Left ventricular septal wall thickness was mildly increased. Mildly increased left ventricular posterior wall thickness. There is mildly increased left ventricular hypertrophy.  2. Elevated left ventricular end-diastolic pressure.  3. Left ventricular diastolic Doppler parameters are consistent with pseudonormalization pattern of LV diastolic filling.  4. Global right ventricle has normal systolic function.The right ventricular size is normal. No  increase in right ventricular wall thickness.  5. Left atrial size was normal.  6. Right atrial size was normal.  7. The mitral valve is normal in structure. Trace mitral valve regurgitation. No evidence of mitral stenosis.  8. The tricuspid valve is normal in structure. Tricuspid valve regurgitation is trivial.  9. The aortic valve The aortic valve is normal in structure. Aortic valve regurgitation is mild by color flow Doppler. Structurally normal aortic valve, with no evidence of sclerosis or stenosis. 10. The pulmonic valve was normal in structure. Pulmonic valve regurgitation is not visualized by color flow Doppler. 11. Mildly elevated pulmonary artery systolic pressure. 12. The inferior vena cava is normal in size with greater than 50% respiratory variability, suggesting right atrial pressure of 3 mmHg.  Dg Chest Port 1 View 03/17/2019 Cardiomegaly with mild interstitial edema.   DG Chest - Portable 1 View 03/20/2019 1. Largely resolved vascular congestion and interstitial edema.   PHYSICAL EXAM     Temp:  [98.1 F (36.7 C)-99.9 F (37.7 C)] 99.5 F (37.5 C) (09/19 1200) Pulse Rate:  [76-98] 77 (09/19 1500) Resp:  [13-35] 25 (09/19 1500) BP: (90-181)/(42-118) 143/67 (09/19 1500) SpO2:  [93 %-98 %] 95 % (09/19 1500)  General - Well nourished, well developed, lethargic.  Ophthalmologic - fundi not visualized due to noncooperation.  Cardiovascular - Regular rate and rhythm.  Neuro - lethargic, mild eye opening apraxia. Eventually able to open eyes, no gaze preference, EOMI, attending to both sides. B/l lower eyelid ectropion. Blinking to visual threat bilaterally. No significant facial droop. Tongue midline. LUE at least 4/5 and RUE 3-/5. However, BLE 0/5 proximal and 2/5 withdraw on pain. BLE also increase muscle tone. Sensation, coordination not cooperative and gait not tested.   ASSESSMENT/PLAN [name] is a 85 y.o. female with history of HTN, HLD, previous  stroke, CAD, CHF, CKD presenting after being found down with severe HTN, L gaze deviation, not following commands. CT shows hemorrhage.   ICH: L frontal small periventricular hemorrhage with extensive IVH likely secondary to uncontrolled hypertension  CT C-Spine degenerative changes, no acute abnormality  CT head large IVH w/ ventricular dilatation. 9 mm L frontal lobe. Sign scalp edema and hematoma along vertex, no fx.  CTA head & neck no underlying vascular lesion for L hemorrhage. Atherosclerosis B ICA bifurcations mas 60%, 70% or more R V4, 50-70% L V4.  MRI  Stable IVH, increased SAH. Hemorrhage originiated from L periventricular white matter. Stable ventriculomegaly, no midline shift. No infarct. Scalp hematoma  2D Echo EF 60-65%. No source of embolus   LDL 113  HgbA1c - pending   Heparin subq for VTE prophylaxis  aspirin 81 mg daily prior to admission, now on No antithrombotic given hemorrhage   Therapy recommendations: SNF  Disposition:  pending  Hypertensive Emergency  Home meds:  Lasix 20, isosorbide 30, losartan 100, zebeta 5mg   Bp 234/127 on arrival  SBP goal < 160 now   off Cleviprex  On prn labetalol  Resume home meds  . Long-term BP goal normotensive  CHF with pulmonary edema  CXR Cardiomegaly with mild interstitial edema  Resume lasix  Decreased IVF from 60->30  CXR repeat Largely resolved vascular congestion and interstitial edema  Hyperlipidemia  Home meds:  lipitor 40  LDL 113  Hold lipitor given ICH  Consider continuation of statin at discharge  Dysphagia . Secondary to stroke . NPO . On TF @ 38 and IVF @ 30 . Placed Cortrak . Speech on board  Bacteremia ??  Blood culture 1/2 GNR  IV Rocephin started 03/20/19 - 7 day course  U/A neg  Other Stroke Risk Factors  Advanced age  Former Cigarette smoker  Hx stroke/TIA - Documented hx stroke but not details in Epic (occurred prior to 2014)  Coronary artery  disease  Other Active Problems  Found down. Collar placed. CT CS cleared bones. Discussed with trauma - literature now ok to remove collar with neg films. Collar d/c'ed.   Hypokalemia 3.2, replace - 3.8->3.5  CKD stage 3 Cre 1.18->1.05  Hx temporal arteritis 11/2012  Thrombocytopenia 122->93->80  Hospital day # 3  This patient is critically ill and at significant risk of neurological worsening, death and care requires constant monitoring of vital signs, hemodynamics,respiratory and cardiac monitoring, extensive review of multiple databases, frequent neurological assessment, discussion with family, other specialists and medical decision making of high complexity. I spent 35 minutes of neurocritical care time  in the care of  this patient.  Jane Hawking, MD PhD Stroke Neurology 03/20/2019 4:35 PM   To contact Stroke Continuity provider, please refer to [email or web address] After hours, contact General Neurology

## 2019-03-20 NOTE — Evaluation (Signed)
Clinical/Bedside Swallow Evaluation Patient Details  Name: Jane [name] MRN: [id number] Date of Birth: Jun 05, 1929  Today's Date: 03/20/2019 Time: SLP Start Time (ACUTE ONLY): 62 SLP Stop Time (ACUTE ONLY): 1448 SLP Time Calculation (min) (ACUTE ONLY): 12 min  Past Medical History:  Past Medical History:  Diagnosis Date  . Anemia   . Aortic insufficiency    mild  . Arthritis    "arms" (08/09/2015)  . CAD (coronary artery disease)    cath 08/09/2014 95% stenosis in prox to mid RCA s/p DES, 80-90% prox OM2, 50% distal LAD  . CHF (congestive heart failure) ([place])   . CKD (chronic kidney disease) stage 3, GFR 30-59 ml/min (HCC)   . Colon polyp    2009 colonoscopy, not retrieved for pathology  . Dyspnea   . Ectropion of left lower eyelid   . GERD (gastroesophageal reflux disease) 2009   EGD with benign gastric polyp too  . Hyperlipidemia   . Hypertension   . Pneumonia    history of  . Stroke ([place]) 1975  . Vitamin D deficiency    Past Surgical History:  Past Surgical History:  Procedure Laterality Date  . ABDOMINAL HYSTERECTOMY    . APPENDECTOMY    . ARTERY BIOPSY Left 12/16/2012   Procedure: BIOPSY TEMPORAL ARTERY;  Surgeon: Mal Misty, MD;  Location: [place];  Service: Vascular;  Laterality: Left;  . CARDIAC CATHETERIZATION    . LEFT HEART CATHETERIZATION WITH CORONARY ANGIOGRAM N/A 08/09/2014   Procedure: LEFT HEART CATHETERIZATION WITH CORONARY ANGIOGRAM;  Surgeon: Peter M Martinique, MD;  Location: [facility] CATH LAB;  Service: Cardiovascular;  Laterality: N/A;  . PERCUTANEOUS CORONARY STENT INTERVENTION (PCI-S)  08/09/2014   Procedure: PERCUTANEOUS CORONARY STENT INTERVENTION (PCI-S);  Surgeon: Peter M Martinique, MD;  Location: [facility] CATH LAB;  Service: Cardiovascular;;  . TONSILLECTOMY     HPI:  Jane [name] is a 85 y.o. female with a past medical history of stroke, GERD, pna, ectropion of left lower eyelid, hypertension, hyperlipidemia, CHF presents today after being found on the  ground by maintenance. MRI stable large volume of intraventricular hemorrhage, although superimposed subarachnoid hemorrhage volume appears increased from the CT on 03/17/2019 suggesting a degree of continued bleeding.   Assessment / Plan / Recommendation Clinical Impression  Visual and hearing disturbances noted durning swallow and speech-cognitive assessments. If spoken to in her left ear she was able to follow commands for oral assessment. Generalized weakness and lingual imprecision noted with suspected decreased sensation on right evidenced by right sided labial residue. Awake but eyes closed needing tactile input for awareness of cup/straw and generally where her body was in space. Multiple swallows following puree and while no distinct s/s aspiration over trials, her oral incoordination, cognitive impairments pose greater risk at present. She is nutritionally supported with Cortrak. Continue oral care to keep mucosa moist and pink. She may need instrumental testing prior to po initiation; continue ST.      SLP Visit Diagnosis: Dysphagia, unspecified (R13.10)    Aspiration Risk  Moderate aspiration risk    Diet Recommendation NPO   Medication Administration: Via alternative means    Other  Recommendations Oral Care Recommendations: Oral care QID   Follow up Recommendations Skilled Nursing facility      Frequency and Duration min 2x/week  2 weeks       Prognosis Prognosis for Safe Diet Advancement: (fair-good) Barriers to Reach Goals: Cognitive deficits      Swallow Study   General HPI: Jane Babbitt  [name] is a 85 y.o. female with a past medical history of stroke, GERD, pna, ectropion of left lower eyelid, hypertension, hyperlipidemia, CHF presents today after being found on the ground by maintenance. MRI stable large volume of intraventricular hemorrhage, although superimposed subarachnoid hemorrhage volume appears increased from the CT on 03/17/2019 suggesting a degree of continued  bleeding. Type of Study: Bedside Swallow Evaluation Previous Swallow Assessment: (none) Diet Prior to this Study: NPO;NG Tube Temperature Spikes Noted: Yes Respiratory Status: Room air History of Recent Intubation: No Behavior/Cognition: Alert;Confused;Cooperative;Pleasant mood;Requires cueing;Other (Comment)(HOH and visual impairment) Oral Cavity Assessment: Other (comment)(lingual candidia) Oral Care Completed by SLP: Yes Oral Cavity - Dentition: Edentulous Vision: Impaired for self-feeding Self-Feeding Abilities: Needs assist;Needs set up Patient Positioning: Upright in bed Baseline Vocal Quality: Normal Volitional Cough: Cognitively unable to elicit Volitional Swallow: Unable to elicit    Oral/Motor/Sensory Function Overall Oral Motor/Sensory Function: Generalized oral weakness   Ice Chips Ice chips: Within functional limits Presentation: Spoon   Thin Liquid Thin Liquid: Impaired Presentation: Cup;Straw Oral Phase Impairments: Reduced labial seal;Reduced lingual movement/coordination;Poor awareness of bolus Oral Phase Functional Implications: Right anterior spillage Pharyngeal  Phase Impairments: Other (comments)(mild wheezing and incr work of breathing)    Nectar Thick Nectar Thick Liquid: Not tested   Honey Thick Honey Thick Liquid: Not tested   Puree Puree: Impaired Presentation: Spoon Oral Phase Impairments: Reduced labial seal Oral Phase Functional Implications: Other (comment)(right labial residue) Pharyngeal Phase Impairments: Multiple swallows   Solid     Solid: Not tested      Houston Siren 03/20/2019,3:23 PM  Orbie Pyo Colvin Caroli.Ed Risk analyst [phone] Office [phone]

## 2019-03-20 NOTE — Progress Notes (Signed)
PHARMACY - PHYSICIAN COMMUNICATION CRITICAL VALUE ALERT - BLOOD CULTURE IDENTIFICATION (BCID)  Jane [name] is an 85 y.o. female who presented to [facility] on 03/17/2019 with a chief complaint of unresponsiveness found to have periventricular hemorrhage.  Assessment:  2/4 bottles positive coag neg staph MecA detected, likely contaminant isolated previously. Micro lab calling now to report there is a GNR growing on the plate - likely identification later tomorrow or early tomorrow  Name of physician (or Provider) Contacted: Dr. Leonie Man, MD  Current antibiotics: none   Changes to prescribed antibiotics recommended:  Recommendations accepted by provider  Recommended to inititate ceftriaxone   Results for orders placed or performed during the hospital encounter of 03/17/19  Blood Culture ID Panel (Reflexed) (Collected: 03/17/2019  4:10 PM)  Result Value Ref Range   Enterococcus species NOT DETECTED NOT DETECTED   Listeria monocytogenes NOT DETECTED NOT DETECTED   Staphylococcus species DETECTED (A) NOT DETECTED   Staphylococcus aureus (BCID) NOT DETECTED NOT DETECTED   Methicillin resistance DETECTED (A) NOT DETECTED   Streptococcus species NOT DETECTED NOT DETECTED   Streptococcus agalactiae NOT DETECTED NOT DETECTED   Streptococcus pneumoniae NOT DETECTED NOT DETECTED   Streptococcus pyogenes NOT DETECTED NOT DETECTED   Acinetobacter baumannii NOT DETECTED NOT DETECTED   Enterobacteriaceae species NOT DETECTED NOT DETECTED   Enterobacter cloacae complex NOT DETECTED NOT DETECTED   Escherichia coli NOT DETECTED NOT DETECTED   Klebsiella oxytoca NOT DETECTED NOT DETECTED   Klebsiella pneumoniae NOT DETECTED NOT DETECTED   Proteus species NOT DETECTED NOT DETECTED   Serratia marcescens NOT DETECTED NOT DETECTED   Haemophilus influenzae NOT DETECTED NOT DETECTED   Neisseria meningitidis NOT DETECTED NOT DETECTED   Pseudomonas aeruginosa NOT DETECTED NOT DETECTED   Candida albicans  NOT DETECTED NOT DETECTED   Candida glabrata NOT DETECTED NOT DETECTED   Candida krusei NOT DETECTED NOT DETECTED   Candida parapsilosis NOT DETECTED NOT DETECTED   Candida tropicalis NOT DETECTED NOT DETECTED    [name] 03/20/2019  9:09 AM

## 2019-03-20 NOTE — Progress Notes (Signed)
Pt started on tube feedings 9/18via cortrak.

## 2019-03-21 ENCOUNTER — Inpatient Hospital Stay (HOSPITAL_COMMUNITY): Payer: Medicare Other

## 2019-03-21 LAB — CULTURE, BLOOD (ROUTINE X 2)

## 2019-03-21 LAB — GLUCOSE, CAPILLARY
Glucose-Capillary: 98 mg/dL (ref 70–99)
Glucose-Capillary: 110 mg/dL — ABNORMAL HIGH (ref 70–99)
Glucose-Capillary: 104 mg/dL — ABNORMAL HIGH (ref 70–99)
Glucose-Capillary: 118 mg/dL — ABNORMAL HIGH (ref 70–99)
Glucose-Capillary: 118 mg/dL — ABNORMAL HIGH (ref 70–99)

## 2019-03-21 LAB — CBC
HCT: 29.7 % — ABNORMAL LOW (ref 36.0–46.0)
Hemoglobin: 10 g/dL — ABNORMAL LOW (ref 12.0–15.0)
MCH: 33.3 pg (ref 26.0–34.0)
MCHC: 33.7 g/dL (ref 30.0–36.0)
MCV: 99 fL (ref 80.0–100.0)
Platelets: 73 10*3/uL — ABNORMAL LOW (ref 150–400)
RBC: 3 MIL/uL — ABNORMAL LOW (ref 3.87–5.11)
RDW: 14.2 % (ref 11.5–15.5)
WBC: 4.7 10*3/uL (ref 4.0–10.5)
nRBC: 0 % (ref 0.0–0.2)

## 2019-03-21 LAB — BASIC METABOLIC PANEL
Anion gap: 10 (ref 5–15)
BUN: 33 mg/dL — ABNORMAL HIGH (ref 8–23)
CO2: 20 mmol/L — ABNORMAL LOW (ref 22–32)
Calcium: 8.2 mg/dL — ABNORMAL LOW (ref 8.9–10.3)
Chloride: 116 mmol/L — ABNORMAL HIGH (ref 98–111)
Creatinine, Ser: 1.18 mg/dL — ABNORMAL HIGH (ref 0.44–1.00)
GFR calc Af Amer: 48 mL/min — ABNORMAL LOW (ref >60)
GFR calc non Af Amer: 41 mL/min — ABNORMAL LOW (ref >60)
Glucose, Bld: 105 mg/dL — ABNORMAL HIGH (ref 70–99)
Potassium: 3.6 mmol/L (ref 3.5–5.1)
Sodium: 146 mmol/L — ABNORMAL HIGH (ref 135–145)

## 2019-03-21 LAB — LACTIC ACID, PLASMA: Lactic Acid, Venous: 2.3 mmol/L (ref 0.5–1.9)

## 2019-03-21 LAB — PROCALCITONIN: Procalcitonin: <0.1 ng/mL

## 2019-03-21 MED ORDER — FREE WATER
200.0000 mL | Freq: Four times a day (QID) | Status: DC
  Administered 2019-03-21 – 2019-03-25 (×16): 200 mL

## 2019-03-21 NOTE — Progress Notes (Signed)
STROKE TEAM PROGRESS NOTE   INTERVAL HISTORY Pt still lethargic, orientated to place and self only. Still has right sided weakness. Overnight and this am, fever, Tmax 101.1. blood culture showed E.Coli, sensitive to rocephin will continue. Curbside with Dr. Chase Caller and will repeat blood culture and check procalcitonin and lactate. CT repeat stable.     Vitals:   03/21/19 0530 03/21/19 0600 03/21/19 0630 03/21/19 0700  BP: (!) 110/93 (!) 145/52 (!) 136/51 (!) 168/62  Pulse: 93 81 88 82  Resp: 17 19 (!) 21 (!) 21  Temp:      TempSrc:      SpO2: 94% 96% 97% 96%  Weight:      Height:        CBC:  Recent Labs  Lab 03/17/19 1811  03/20/19 0545 03/21/19 0332  WBC 8.4   < > 6.6 4.7  NEUTROABS 7.3  --   --   --   HGB 13.3   < > 10.5* 10.0*  HCT 40.0   < > 30.5* 29.7*  MCV 98.5   < > 97.8 99.0  PLT 122*   < > 80* 73*   < > = values in this interval not displayed.    Basic Metabolic Panel:  Recent Labs  Lab 03/17/19 1922  03/20/19 0545 03/21/19 0332  NA  --    < > 143 146*  K  --    < > 3.5 3.6  CL  --    < > 115* 116*  CO2  --    < > 17* 20*  GLUCOSE  --    < > 146* 105*  BUN  --    < > 31* 33*  CREATININE  --    < > 1.05* 1.18*  CALCIUM  --    < > 8.1* 8.2*  MG 1.7  --   --   --    < > = values in this interval not displayed.   Lipid Panel:     Component Value Date/Time   CHOL 185 03/19/2019 0621   CHOL 140 12/31/2016 1409   TRIG 146 03/19/2019 0621   HDL 43 03/19/2019 0621   HDL 61 12/31/2016 1409   CHOLHDL 4.3 03/19/2019 0621   VLDL 29 03/19/2019 0621   LDLCALC 113 (H) 03/19/2019 0621   LDLCALC 68 12/31/2016 1409   HgbA1c:  Lab Results  Component Value Date   HGBA1C 5.7 (H) 06/01/2014   Urine Drug Screen: No results found for: LABOPIA, COCAINSCRNUR, LABBENZ, AMPHETMU, THCU, LABBARB  Alcohol Level     Component Value Date/Time   ETH <10 03/17/2019 1925    IMAGING  Ct Head Wo Contrast 03/17/2019   1. Large intraventricular hemorrhage with  ventricular dilatation. 2. 9 millimeter intraparenchymal hemorrhage within the frontal lobe periventricular white matter, adjacent to the body of the LEFT LATERAL ventricle. 3. Atrophy and small vessel disease. 4. Significant scalp edema and hematoma along the vertex, not associated with underlying fracture.   CT Head WO Contrast 03/21/19 IMPRESSION: 1. Unchanged appearance of large volume intraventricular and small volume extra-axial subarachnoid hemorrhage. No new hemorrhagic site. 2. Slight decrease in size of the lateral ventricles compared to the prior study. 3. Large scalp vertex hematoma.  Ct Cervical Spine Wo Contrast 03/17/2019   Significant stable degenerative changes throughout the cervical spine. No evidence for acute abnormality.   Ct Angio Head W Or Wo Contrast Ct Angio Neck W Or Wo Contrast 03/18/2019 No evidence of underlying vascular lesion  in the region of the deep brain hemorrhage on the left. Advanced aortic atherosclerosis. Atherosclerotic disease at both carotid bifurcations with maximal stenosis of 60% on each side. 30-50% stenosis at both vertebral artery origins. 50% or less stenosis in both carotid siphon regions. 70% or greater stenosis of the right vertebral artery V4 segment. 50-70% stenosis of the left vertebral artery V4 segment.   Mr Brain Wo Contrast 03/19/2019 1. Stable large volume of intraventricular hemorrhage, although superimposed subarachnoid hemorrhage volume appears increased from the CT on 03/17/2019 suggesting a degree of continued bleeding.  2. Stable ventriculomegaly, with mild transependymal edema. No significant midline shift. Basilar cisterns remain patent.  3. As before, the hemorrhage might have originated in the left periventricular white matter. No superimposed acute infarct. No underlying lesion is identified in the absence of IV contrast.  4. Scalp hematoma at the vertex re-identified.    2D Echocardiogram  1. Left ventricular ejection  fraction, by visual estimation, is 60 to 65%. The left ventricle has normal function. Normal left ventricular size. Left ventricular septal wall thickness was mildly increased. Mildly increased left ventricular posterior wall thickness. There is mildly increased left ventricular hypertrophy.  2. Elevated left ventricular end-diastolic pressure.  3. Left ventricular diastolic Doppler parameters are consistent with pseudonormalization pattern of LV diastolic filling.  4. Global right ventricle has normal systolic function.The right ventricular size is normal. No increase in right ventricular wall thickness.  5. Left atrial size was normal.  6. Right atrial size was normal.  7. The mitral valve is normal in structure. Trace mitral valve regurgitation. No evidence of mitral stenosis.  8. The tricuspid valve is normal in structure. Tricuspid valve regurgitation is trivial.  9. The aortic valve The aortic valve is normal in structure. Aortic valve regurgitation is mild by color flow Doppler. Structurally normal aortic valve, with no evidence of sclerosis or stenosis. 10. The pulmonic valve was normal in structure. Pulmonic valve regurgitation is not visualized by color flow Doppler. 11. Mildly elevated pulmonary artery systolic pressure. 12. The inferior vena cava is normal in size with greater than 50% respiratory variability, suggesting right atrial pressure of 3 mmHg.  Dg Chest Port 1 View 03/17/2019 Cardiomegaly with mild interstitial edema.   DG Chest - Portable 1 View 03/20/2019 1. Largely resolved vascular congestion and interstitial edema.  Ct Head Wo Contrast 03/21/2019 IMPRESSION: 1. Unchanged appearance of large volume intraventricular and small volume extra-axial subarachnoid hemorrhage. No new hemorrhagic site. 2. Slight decrease in size of the lateral ventricles compared to the prior study. 3. Large scalp vertex hematoma. Electronically Signed   By: Ulyses Jarred M.D.   On: 03/21/2019  03:34    PHYSICAL EXAM     Temp:  [99 F (37.2 C)-100.5 F (38.1 C)] 100 F (37.8 C) (09/20 0400) Pulse Rate:  [67-93] 82 (09/20 0700) Resp:  [14-25] 21 (09/20 0700) BP: (108-187)/(40-100) 168/62 (09/20 0700) SpO2:  [94 %-100 %] 96 % (09/20 0700) Weight:  [65.3 kg] 65.3 kg (09/20 0500)  General - Well nourished, well developed, lethargic.  Ophthalmologic - fundi not visualized due to noncooperation.  Cardiovascular - Regular rate and rhythm.  Neuro - lethargic, mild eye opening apraxia. Only orientated to self and place, not to age or time. Eventually able to open eyes, no gaze preference, attending to both sides. B/l lower eyelid ectropion. Blinking to visual threat bilaterally. Right mild facial droop. Tongue midline. LUE 3/5 and RUE 2/5. However, BLE 0/5 proximal and 2+/5 withdraw on pain  on the left and 2-/5 withdraw on the right. BLE also increase muscle tone. Sensation, coordination not cooperative and gait not tested.   ASSESSMENT/PLAN Ms. MAKYNLEE QUINDE is a 85 y.o. female with history of HTN, HLD, previous stroke, CAD, CHF, CKD presenting after being found down with severe HTN, L gaze deviation, not following commands. CT shows hemorrhage.   ICH: L frontal small periventricular hemorrhage with extensive IVH likely secondary to uncontrolled hypertension  CT C-Spine degenerative changes, no acute abnormality  CT head large IVH w/ ventricular dilatation. 9 mm L frontal lobe. Sign scalp edema and hematoma along vertex, no fx.  CTA head & neck no underlying vascular lesion for L hemorrhage. Atherosclerosis B ICA bifurcations mas 60%, 70% or more R V4, 50-70% L V4.  MRI  Stable IVH, increased SAH. Hemorrhage originiated from L periventricular white matter. Stable ventriculomegaly, no midline shift. No infarct. Scalp hematoma  CT head 03/21/19 stable IVH and SAH  2D Echo EF 60-65%. No source of embolus   LDL 113  HgbA1c - 03/19/19 - still pending   Heparin subq for  VTE prophylaxis  aspirin 81 mg daily prior to admission, now on No antithrombotic given hemorrhage   Therapy recommendations: SNF  Disposition:  pending  Hypertensive Emergency  Home meds:  Lasix 20, isosorbide 30, losartan 100, zebeta 5mg   BP 234/127 on arrival  SBP goal < 160 now   Off Cleviprex now  On prn labetalol  Resume home meds  . Long-term BP goal normotensive  CHF with pulmonary edema  CXR Cardiomegaly with mild interstitial edema  Resume lasix  Decreased IVF from 60->30  CXR repeat Largely resolved vascular congestion and interstitial edema  Hyperlipidemia  Home meds:  lipitor 40  LDL 113  Hold lipitor given ICH  Consider continuation of statin at discharge  Dysphagia . Secondary to stroke . NPO . On TF @ 24 and IVF @ 30 . Placed Cortrak . Add free water . Speech on board  E.Coli bacteremia with fever  Tmax 100.5->101.1  Blood culture 1/2 E. Coli - sensitive to rocephin  IV Rocephin started 03/20/19 - 7 day course  U/A neg  WBC - 9.0-> 4.7  Curbside with CCM, will check procalcitonin, LA, and repeat BCx  AKI on CKD stage III  Cre 1.18->1.05->1.18  Na 143->146  Continue IVF and TF  Add free water 200 Q6  Close monitoring  Other Stroke Risk Factors  Advanced age  Former Cigarette smoker  Hx stroke/TIA - Documented hx stroke but not details in Epic (occurred prior to 2014)  Coronary artery disease  Other Active Problems  Found down. Collar placed. CT CS cleared bones. Discussed with trauma - literature now ok to remove collar with neg films. Collar d/c'ed.   Hypokalemia 3.2, replace - 3.8->3.5->3.6  Hx temporal arteritis 11/2012  Thrombocytopenia 122->93->80->73  Hospital day # 4  This patient is critically ill and at significant risk of neurological worsening, death and care requires constant monitoring of vital signs, hemodynamics,respiratory and cardiac monitoring, extensive review of multiple databases,  frequent neurological assessment, discussion with family, other specialists and medical decision making of high complexity. I spent 35 minutes of neurocritical care time  in the care of  this patient. Discussed with CCM Dr. Minette Brine, MD PhD Stroke Neurology 03/21/2019 10:06 AM   To contact Stroke Continuity provider, please refer to [email or web address] After hours, contact General Neurology

## 2019-03-21 NOTE — Progress Notes (Signed)
  Speech Language Pathology Treatment: Dysphagia  Patient Details Name: Jane [name] MRN: [id number] DOB: [birth year] Today's Date: 03/21/2019 Time: [id number] SLP Time Calculation (min) (ACUTE ONLY): 24 min  Assessment / Plan / Recommendation Clinical Impression  Patient seen for treatment today with focus on dysphagia goals. Patient alert and pleasant. SLP positioned on left side to maximize understanding of verbally provided directions/information. Verbal output appropriate today with moderate repetition of information. Short term recall noted to be significantly impacted. Oral care provided to maximize safety with po intake. Patient then able to consume thin liquids via straw (for maximum awareness and control of bolus) and pureed solids without overt s/s of aspiration. SLP providing max verbal and tactile cueing (touch to bottom lip) to facilitate awareness of bolus and initiation of oral intake. Noted mildly decreased labial seal and increased RR following multiple consecutive straw sips of thin liquid, but otherwise, patient with a seemingly intact oropharyngeal swallow. Did not attempt soft or regular texture solids today as patient states she does not consume these without dentures (? Reliability).  Recommend initiation of a conservative po diet with close SLP f/u for tolerance. Discussed with RN.   HPI HPI: DOMINQUE WICK is a 85 y.o. female with a past medical history of stroke, GERD, pna, ectropion of left lower eyelid, hypertension, hyperlipidemia, CHF presents today after being found on the ground by maintenance. MRI A small volume of superimposed subarachnoid hemorrhage layering posteriorly has increased especially in the posterior fossa along the cerebellar convexities. The hemorrhage might have originated in the left periventricular white matter.      SLP Plan  Goals updated       Recommendations  Diet recommendations: Dysphagia 1 (puree);Thin liquid Liquids provided via:  Straw Medication Administration: Crushed with puree Supervision: Staff to assist with self feeding;Full supervision/cueing for compensatory strategies Compensations: Slow rate;Small sips/bites;Other (Comment)(tactile cues to bottom lip for mouth opening) Postural Changes and/or Swallow Maneuvers: Seated upright 90 degrees                Oral Care Recommendations: Oral care BID Follow up Recommendations: Skilled Nursing facility SLP Visit Diagnosis: Dysphagia, unspecified (R13.10) Plan: Goals updated       Gabriel Rainwater MA, CCC-SLP     Salima Rumer Meryl 03/21/2019, 9:25 AM

## 2019-03-22 ENCOUNTER — Inpatient Hospital Stay (HOSPITAL_COMMUNITY): Payer: Medicare Other

## 2019-03-22 LAB — BASIC METABOLIC PANEL
Anion gap: 11 (ref 5–15)
BUN: 26 mg/dL — ABNORMAL HIGH (ref 8–23)
CO2: 23 mmol/L (ref 22–32)
Calcium: 8 mg/dL — ABNORMAL LOW (ref 8.9–10.3)
Chloride: 108 mmol/L (ref 98–111)
Creatinine, Ser: 0.97 mg/dL (ref 0.44–1.00)
GFR calc Af Amer: >60 mL/min (ref >60)
GFR calc non Af Amer: 52 mL/min — ABNORMAL LOW (ref >60)
Glucose, Bld: 110 mg/dL — ABNORMAL HIGH (ref 70–99)
Potassium: 3.8 mmol/L (ref 3.5–5.1)
Sodium: 142 mmol/L (ref 135–145)

## 2019-03-22 LAB — GLUCOSE, CAPILLARY
Glucose-Capillary: 101 mg/dL — ABNORMAL HIGH (ref 70–99)
Glucose-Capillary: 113 mg/dL — ABNORMAL HIGH (ref 70–99)
Glucose-Capillary: 122 mg/dL — ABNORMAL HIGH (ref 70–99)
Glucose-Capillary: 123 mg/dL — ABNORMAL HIGH (ref 70–99)
Glucose-Capillary: 125 mg/dL — ABNORMAL HIGH (ref 70–99)
Glucose-Capillary: 132 mg/dL — ABNORMAL HIGH (ref 70–99)

## 2019-03-22 LAB — CBC
HCT: 31.8 % — ABNORMAL LOW (ref 36.0–46.0)
Hemoglobin: 10.7 g/dL — ABNORMAL LOW (ref 12.0–15.0)
MCH: 32.9 pg (ref 26.0–34.0)
MCHC: 33.6 g/dL (ref 30.0–36.0)
MCV: 97.8 fL (ref 80.0–100.0)
Platelets: 60 10*3/uL — ABNORMAL LOW (ref 150–400)
RBC: 3.25 MIL/uL — ABNORMAL LOW (ref 3.87–5.11)
RDW: 13.8 % (ref 11.5–15.5)
WBC: 6.8 10*3/uL (ref 4.0–10.5)
nRBC: 0 % (ref 0.0–0.2)

## 2019-03-22 LAB — CULTURE, BLOOD (ROUTINE X 2): Culture: NO GROWTH

## 2019-03-22 LAB — PROCALCITONIN: Procalcitonin: <0.1 ng/mL

## 2019-03-22 MED ORDER — BISOPROLOL FUMARATE 10 MG PO TABS
10.0000 mg | ORAL_TABLET | Freq: Two times a day (BID) | ORAL | Status: DC
  Administered 2019-03-22 – 2019-03-27 (×10): 10 mg
  Filled 2019-03-22 (×11): qty 1

## 2019-03-22 MED ORDER — PRO-STAT SUGAR FREE PO LIQD
30.0000 mL | Freq: Two times a day (BID) | ORAL | Status: DC
  Administered 2019-03-22 – 2019-03-27 (×10): 30 mL
  Filled 2019-03-22 (×10): qty 30

## 2019-03-22 MED ORDER — JEVITY 1.2 CAL PO LIQD
1000.0000 mL | ORAL | Status: DC
  Administered 2019-03-22: 1000 mL
  Filled 2019-03-22 (×10): qty 1000

## 2019-03-22 MED ORDER — ENSURE ENLIVE PO LIQD
237.0000 mL | Freq: Two times a day (BID) | ORAL | Status: DC
  Administered 2019-03-22 – 2019-03-27 (×6): 237 mL via ORAL

## 2019-03-22 NOTE — Progress Notes (Signed)
Nutrition Follow-up  DOCUMENTATION CODES:   Not applicable  INTERVENTION:   Change continuous TF to nocturnal feedings to support PO intake during the day however restrictive diet and confusion also contribute to poor intake.  Will continue to wean TF based on PO intake.   Jevity 1.2 @ 75 ml/hr (run for 14 hrs) via Cortrak tube  Provides: 1460 kcal, 88 grams protein, and 851 ml free water. Total free water: 1651 ml   Offer Ensure Enlive po BID, each supplement provides 350 kcal and 20 grams of protein  NUTRITION DIAGNOSIS:   Inadequate oral intake related to inability to eat as evidenced by NPO status. Progressing, diet advanced 9/20  GOAL:   Patient will meet greater than or equal to 90% of their needs Met with TF  MONITOR:   TF tolerance  REASON FOR ASSESSMENT:   Consult Enteral/tube feeding initiation and management  ASSESSMENT:   Pt with PMH of HTN, CKD stage III, CHF, CAD, HLD, and vitamin D deficiency who lives alone admitted after being found down at home after 2 days with L IVH due to uncontrolled HTN.    Pt discussed during ICU rounds and with RN.  Per RN pt oriented to self only, she consumed bites at breakfast this am.   9/18 failed slp eval, cortrak placed (tip in stomach)  9/20 dysphagia 1/thin liquids started  Medications reviewed and include: lasix, senokot-s 200 ml free water every 6 hours = 800 ml  Labs reviewed   Cortrak: Jevity 1.2 @ 50 ml/hr with 30 ml Prostat daily  Provides:1540 kcal, 81 grams protein, and 973 ml free water.    Diet Order:   Diet Order            DIET - DYS 1 Room service appropriate? Yes; Fluid consistency: Thin  Diet effective now              EDUCATION NEEDS:   No education needs have been identified at this time  Skin:  Skin Assessment: (stage I sacrum)  Last BM:  unknown  Height:   Ht Readings from Last 1 Encounters:  03/19/19 5' (1.524 m)    Weight:   Wt Readings from Last 1 Encounters:   03/21/19 65.3 kg    Ideal Body Weight:  45.4 kg  BMI:  Body mass index is 28.12 kg/m.  Estimated Nutritional Needs:   Kcal:  1500-1700  Protein:  75-85 grams  Fluid:  >1.5 L/day  Maylon Peppers RD, LDN, CNSC [phone] Pager [phone] After Hours Pager

## 2019-03-22 NOTE — Progress Notes (Signed)
Physical Therapy Treatment Patient Details Name: Jane [name] MRN: [id number] DOB: [birth year] Today's Date: 03/22/2019    History of Present Illness 85 y.o. female admitted on 03/18/19 for L frontal periventricular hemorrhage, L IVH secondary to uncontrolled HTN.  Pt with significant PMH of stroke, HTN, CKD, CHF, CAD, anemia.    PT Comments    Patient progressing slowly, participated in EOB and was eager to remain sitting up so set bed in chair position.  Also lateral scooting in bed with +2 total A.  Feel next session appropriate to attempt weight bearing for transfer.  She will continue to benefit from skilled PT in the acute setting and follow up SNF level rehab at d/c.    Follow Up Recommendations  SNF     Equipment Recommendations  Wheelchair (measurements PT);Wheelchair cushion (measurements PT);Hospital bed;Other (comment)    Recommendations for Other Services       Precautions / Restrictions Precautions Precautions: Fall    Mobility  Bed Mobility Overal bed mobility: Needs Assistance Bed Mobility: Rolling;Sidelying to Sit;Sit to Supine Rolling: Max assist;+2 for safety/equipment Sidelying to sit: +2 for physical assistance;Total assist   Sit to supine: +2 for physical assistance;Total assist   General bed mobility comments: up to EOB then to supine BP sitting initially [id number] systolic (supposed to be [id number]) repeat 154  Transfers Overall transfer level: Needs assistance   Transfers: Lateral/Scoot Transfers          Lateral/Scoot Transfers: +2 physical assistance;Total assist General transfer comment: scooting up to [facility] in sitting  Ambulation/Gait                 Stairs             Wheelchair Mobility    Modified Rankin (Stroke Patients Only) Modified Rankin (Stroke Patients Only) Pre-Morbid Rankin Score: No significant disability Modified Rankin: Severe disability     Balance Overall balance assessment: Needs assistance Sitting-balance  support: Feet supported Sitting balance-Leahy Scale: Poor Sitting balance - Comments: mod to min A for sitting balance Postural control: Posterior lean                                  Cognition Arousal/Alertness: Lethargic Behavior During Therapy: Flat affect Overall Cognitive Status: Impaired/Different from baseline Area of Impairment: Orientation;Attention;Memory;Following commands;Safety/judgement;Awareness;Problem solving                 Orientation Level: Disoriented to;Place;Time;Situation Current Attention Level: Focused   Following Commands: Follows one step commands inconsistently     Problem Solving: Slow processing;Decreased initiation;Requires verbal cues;Requires tactile cues        Exercises General Exercises - Lower Extremity Heel Slides: AAROM;10 reps;Both;Supine    General Comments General comments (skin integrity, edema, etc.): large area of ecchymosis over L upper back/shoulder and over L eye      Pertinent Vitals/Pain Faces Pain Scale: Hurts even more Pain Location: back pain in flat bed position Pain Descriptors / Indicators: Grimacing;Moaning Pain Intervention(s): Monitored during session;Repositioned    Home Living                      Prior Function            PT Goals (current goals can now be found in the care plan section) Progress towards PT goals: Progressing toward goals(slowly)    Frequency    Min 3X/week      PT Plan Current  plan remains appropriate    Co-evaluation              AM-PAC PT "6 Clicks" Mobility   Outcome Measure  Help needed turning from your back to your side while in a flat bed without using bedrails?: Total Help needed moving from lying on your back to sitting on the side of a flat bed without using bedrails?: Total Help needed moving to and from a bed to a chair (including a wheelchair)?: Total Help needed standing up from a chair using your arms (e.g., wheelchair or  bedside chair)?: Total Help needed to walk in hospital room?: Total Help needed climbing 3-5 steps with a railing? : Total 6 Click Score: 6    End of Session   Activity Tolerance: Patient limited by lethargy;Other (comment)(due to [facility]) Patient left: in bed;with call bell/phone within reach;with bed alarm set(bed in chair position) Nurse Communication: Other (comment)(needs new pure wick) PT Visit Diagnosis: Other abnormalities of gait and mobility (R26.89);Other symptoms and signs involving the nervous system (R29.898);Hemiplegia and hemiparesis Hemiplegia - Right/Left: Right Hemiplegia - dominant/non-dominant: Dominant Hemiplegia - caused by: Nontraumatic intracerebral hemorrhage     Time: [id number] PT Time Calculation (min) (ACUTE ONLY): 23 min  Charges:  $Therapeutic Exercise: 8-22 mins $Therapeutic Activity: 8-22 mins                     Magda Kiel, [place] [facility] [phone] 03/22/2019    Jane [name] 03/22/2019, 5:39 PM

## 2019-03-22 NOTE — Progress Notes (Signed)
  Speech Language Pathology Treatment: Dysphagia;Cognitive-Linquistic  Patient Details Name: LUCETTA RINE MRN: [id number] DOB: 06-07-1929 Today's Date: 03/22/2019 Time: [id number] SLP Time Calculation (min) (ACUTE ONLY): 51 min  Assessment / Plan / Recommendation Clinical Impression  Pt responded with less delays than 9/19 and consumed breakfast of puree and thin liquids via straw with SLP assist given visual disturbance. Min-mild oral residue but propulsion effective. No s/s aspiration present with limited intake of approximately 6 bites and 2 oz milk. Pt stated she was full, suspect continuous tube feeds at 50/hr may be interferring with hunger; could rate/volume be reduced/nocturnal.   Continues to have difficulty verbalizing thoughts and repeats phrases and unable to complete at times. She needed reminders for place and situation. Dysarthria with decreased intelligibility for phrases and needs more cueing/practice to over articulate and increase intensity.    HPI HPI: JAZMEN POLYNICE is a 85 y.o. female with a past medical history of stroke, GERD, pna, ectropion of left lower eyelid, hypertension, hyperlipidemia, CHF presents today after being found on the ground by maintenance. MRI A small volume of superimposed subarachnoid hemorrhage layering posteriorly has increased especially in the posterior fossa along the cerebellar convexities. The hemorrhage might have originated in the left periventricular white matter.      SLP Plan  Continue with current plan of care       Recommendations  Diet recommendations: Thin liquid;Dysphagia 1 (puree) Liquids provided via: Straw Medication Administration: Crushed with puree Supervision: Staff to assist with self feeding;Full supervision/cueing for compensatory strategies Compensations: Slow rate;Small sips/bites Postural Changes and/or Swallow Maneuvers: Seated upright 90 degrees                Oral Care Recommendations: Oral care  BID Follow up Recommendations: Skilled Nursing facility SLP Visit Diagnosis: Dysphagia, unspecified (R13.10);Cognitive communication deficit [id number]) Plan: Continue with current plan of care       GO                Houston Siren 03/22/2019, 10:11 AM   Orbie Pyo Colvin Caroli.Ed Risk analyst [phone] Office [phone]

## 2019-03-22 NOTE — Progress Notes (Signed)
STROKE TEAM PROGRESS NOTE   INTERVAL HISTORY Pt still lethargic, with intermittent fever. On rocephin. Orientated to place and self only. Still has right sided weakness. Procalcitonin negative.     Vitals:   03/22/19 0530 03/22/19 0600 03/22/19 0630 03/22/19 0700  BP: (!) 145/63 (!) 162/54 (!) 158/60 (!) 165/56  Pulse: 71 79 78 75  Resp: (!) 21 19 20 20   Temp:      TempSrc:      SpO2: 96% 94% 96% 98%  Weight:      Height:        CBC:  Recent Labs  Lab 03/17/19 1811  03/21/19 0332 03/22/19 0602  WBC 8.4   < > 4.7 6.8  NEUTROABS 7.3  --   --   --   HGB 13.3   < > 10.0* 10.7*  HCT 40.0   < > 29.7* 31.8*  MCV 98.5   < > 99.0 97.8  PLT 122*   < > 73* 60*   < > = values in this interval not displayed.    Basic Metabolic Panel:  Recent Labs  Lab 03/17/19 1922  03/21/19 0332 03/22/19 0602  NA  --    < > 146* 142  K  --    < > 3.6 3.8  CL  --    < > 116* 108  CO2  --    < > 20* 23  GLUCOSE  --    < > 105* 110*  BUN  --    < > 33* 26*  CREATININE  --    < > 1.18* 0.97  CALCIUM  --    < > 8.2* 8.0*  MG 1.7  --   --   --    < > = values in this interval not displayed.   Lipid Panel:     Component Value Date/Time   CHOL 185 03/19/2019 0621   CHOL 140 12/31/2016 1409   TRIG 146 03/19/2019 0621   HDL 43 03/19/2019 0621   HDL 61 12/31/2016 1409   CHOLHDL 4.3 03/19/2019 0621   VLDL 29 03/19/2019 0621   LDLCALC 113 (H) 03/19/2019 0621   LDLCALC 68 12/31/2016 1409   HgbA1c:  Lab Results  Component Value Date   HGBA1C 5.7 (H) 06/01/2014   Urine Drug Screen: No results found for: LABOPIA, COCAINSCRNUR, LABBENZ, AMPHETMU, THCU, LABBARB  Alcohol Level     Component Value Date/Time   ETH <10 03/17/2019 1925    IMAGING  Ct Head Wo Contrast 03/17/2019   1. Large intraventricular hemorrhage with ventricular dilatation. 2. 9 millimeter intraparenchymal hemorrhage within the frontal lobe periventricular white matter, adjacent to the body of the LEFT LATERAL ventricle.  3. Atrophy and small vessel disease. 4. Significant scalp edema and hematoma along the vertex, not associated with underlying fracture.   CT Head WO Contrast 03/21/19 IMPRESSION: 1. Unchanged appearance of large volume intraventricular and small volume extra-axial subarachnoid hemorrhage. No new hemorrhagic site. 2. Slight decrease in size of the lateral ventricles compared to the prior study. 3. Large scalp vertex hematoma.  Ct Cervical Spine Wo Contrast 03/17/2019   Significant stable degenerative changes throughout the cervical spine. No evidence for acute abnormality.   Ct Angio Head W Or Wo Contrast Ct Angio Neck W Or Wo Contrast 03/18/2019 No evidence of underlying vascular lesion in the region of the deep brain hemorrhage on the left. Advanced aortic atherosclerosis. Atherosclerotic disease at both carotid bifurcations with maximal stenosis of 60% on each  side. 30-50% stenosis at both vertebral artery origins. 50% or less stenosis in both carotid siphon regions. 70% or greater stenosis of the right vertebral artery V4 segment. 50-70% stenosis of the left vertebral artery V4 segment.   Mr Brain Wo Contrast 03/19/2019 1. Stable large volume of intraventricular hemorrhage, although superimposed subarachnoid hemorrhage volume appears increased from the CT on 03/17/2019 suggesting a degree of continued bleeding.  2. Stable ventriculomegaly, with mild transependymal edema. No significant midline shift. Basilar cisterns remain patent.  3. As before, the hemorrhage might have originated in the left periventricular white matter. No superimposed acute infarct. No underlying lesion is identified in the absence of IV contrast.  4. Scalp hematoma at the vertex re-identified.    2D Echocardiogram  1. Left ventricular ejection fraction, by visual estimation, is 60 to 65%. The left ventricle has normal function. Normal left ventricular size. Left ventricular septal wall thickness was mildly increased.  Mildly increased left ventricular posterior wall thickness. There is mildly increased left ventricular hypertrophy.  2. Elevated left ventricular end-diastolic pressure.  3. Left ventricular diastolic Doppler parameters are consistent with pseudonormalization pattern of LV diastolic filling.  4. Global right ventricle has normal systolic function.The right ventricular size is normal. No increase in right ventricular wall thickness.  5. Left atrial size was normal.  6. Right atrial size was normal.  7. The mitral valve is normal in structure. Trace mitral valve regurgitation. No evidence of mitral stenosis.  8. The tricuspid valve is normal in structure. Tricuspid valve regurgitation is trivial.  9. The aortic valve The aortic valve is normal in structure. Aortic valve regurgitation is mild by color flow Doppler. Structurally normal aortic valve, with no evidence of sclerosis or stenosis. 10. The pulmonic valve was normal in structure. Pulmonic valve regurgitation is not visualized by color flow Doppler. 11. Mildly elevated pulmonary artery systolic pressure. 12. The inferior vena cava is normal in size with greater than 50% respiratory variability, suggesting right atrial pressure of 3 mmHg.  Dg Chest Port 1 View 03/17/2019 Cardiomegaly with mild interstitial edema.   DG Chest - Portable 1 View 03/20/2019 1. Largely resolved vascular congestion and interstitial edema.   PHYSICAL EXAM     Temp:  [98.5 F (36.9 C)-100.9 F (38.3 C)] 98.5 F (36.9 C) (09/21 0424) Pulse Rate:  [69-86] 75 (09/21 0700) Resp:  [14-26] 20 (09/21 0700) BP: (110-188)/(46-107) 165/56 (09/21 0700) SpO2:  [93 %-100 %] 98 % (09/21 0700)  General - Well nourished, well developed, lethargic.  Ophthalmologic - fundi not visualized due to noncooperation.  Cardiovascular - Regular rate and rhythm.  Neuro - lethargic, mild eye opening apraxia. Only orientated to self and place, not to age or time. Eventually  able to open eyes, no gaze preference, attending to both sides, but seems to have left INO. B/l lower eyelid ectropion. Blinking to visual threat bilaterally. Left mild facial droop. Tongue midline. LUE 3/5 and RUE 2/5. However, BLE 0/5 proximal and 2+/5 withdraw on pain on the left and 2-/5 withdraw on the right. BLE also increase muscle tone. Sensation, coordination not cooperative and gait not tested.   ASSESSMENT/PLAN Ms. KIMERLY DUMARS is a 85 y.o. female with history of HTN, HLD, previous stroke, CAD, CHF, CKD presenting after being found down with severe HTN, L gaze deviation, not following commands. CT shows hemorrhage.   ICH: L frontal small periventricular hemorrhage with extensive IVH likely secondary to uncontrolled hypertension  CT C-Spine degenerative changes, no acute abnormality  CT  head large IVH w/ ventricular dilatation. 9 mm L frontal lobe. Sign scalp edema and hematoma along vertex, no fx.  CTA head & neck no underlying vascular lesion for L hemorrhage. Atherosclerosis B ICA bifurcations mas 60%, 70% or more R V4, 50-70% L V4.  MRI  Stable IVH, increased SAH. Hemorrhage originiated from L periventricular white matter. Stable ventriculomegaly, no midline shift. No infarct. Scalp hematoma  CT head 03/21/19 stable IVH and SAH  2D Echo EF 60-65%. No source of embolus   LDL 113  HgbA1c - 03/19/19 - still pending  Heparin subq for VTE prophylaxis  aspirin 81 mg daily prior to admission, now on No antithrombotic given hemorrhage   Therapy recommendations: SNF  Disposition:  pending  Hypertensive Emergency  Home meds:  Lasix 20, isosorbide 30, losartan 100, zebeta 5mg   BP 234/127 on arrival  SBP goal < 160   Off Cleviprex now  On prn labetalol  Resume home meds and increase zebeta to 10mg  bid . Long-term BP goal normotensive  CHF with pulmonary edema  CXR Cardiomegaly with mild interstitial edema  Resume lasix  Decreased IVF from 60->30  CXR repeat  Largely resolved vascular congestion and interstitial edema  Repeat CXR pending  Hyperlipidemia  Home meds:  lipitor 40  LDL 113  Hold lipitor given ICH  Consider continuation of statin at discharge  Dysphagia . Secondary to stroke . Now on dys1 and thin liquid . Poor po intake . On TF @ 50 and free water 200 Q6 . Has Cortrak . Speech on board  E.Coli bacteremia with fever  Tmax 100.5->101.1  Blood culture 1/2 E. Coli - sensitive to rocephin  IV Rocephin started 03/20/19 - 7 day course  U/A neg  WBC - 9.0-> 4.7->6.8  Curbside with CCM, procalcitonin neg, LA 2.3, and repeat BCx pending  AKI on CKD stage III  Cre 1.18->1.05->1.18->0.97  Na 143->146->142  Continue IVF and TF  On free water 200 Q6  Close monitoring  Thrombocytopenia   Platelet 122->93->80->73->60  CBC monitoring  Other Stroke Risk Factors  Advanced age  Former Cigarette smoker  Hx stroke/TIA - Documented hx stroke but not details in Epic (occurred prior to 2014)  Coronary artery disease  Other Active Problems  Found down. Collar placed. CT CS cleared bones. Discussed with trauma - literature now ok to remove collar with neg films. Collar d/c'ed.   Hypokalemia 3.2, replace - 3.8->3.5->3.6->3.8  Hx temporal arteritis 11/2012  Hospital day # 5  This patient is critically ill and at significant risk of neurological worsening, death and care requires constant monitoring of vital signs, hemodynamics,respiratory and cardiac monitoring, extensive review of multiple databases, frequent neurological assessment, discussion with family, other specialists and medical decision making of high complexity. I had long discussion with POA [name] over the phone, updated pt current condition, treatment plan and potential prognosis and placement. He expressed understanding and appreciation. I spent 35 minutes of neurocritical care time in the care of  this patient.   Rosalin Hawking, MD PhD Stroke  Neurology 03/22/2019 6:24 PM   To contact Stroke Continuity provider, please refer to [email or web address] After hours, contact General Neurology

## 2019-03-23 LAB — GLUCOSE, CAPILLARY
Glucose-Capillary: 103 mg/dL — ABNORMAL HIGH (ref 70–99)
Glucose-Capillary: 106 mg/dL — ABNORMAL HIGH (ref 70–99)
Glucose-Capillary: 122 mg/dL — ABNORMAL HIGH (ref 70–99)
Glucose-Capillary: 123 mg/dL — ABNORMAL HIGH (ref 70–99)
Glucose-Capillary: 125 mg/dL — ABNORMAL HIGH (ref 70–99)
Glucose-Capillary: 130 mg/dL — ABNORMAL HIGH (ref 70–99)

## 2019-03-23 LAB — BASIC METABOLIC PANEL
Anion gap: 8 (ref 5–15)
BUN: 31 mg/dL — ABNORMAL HIGH (ref 8–23)
CO2: 24 mmol/L (ref 22–32)
Calcium: 7.6 mg/dL — ABNORMAL LOW (ref 8.9–10.3)
Chloride: 107 mmol/L (ref 98–111)
Creatinine, Ser: 1.03 mg/dL — ABNORMAL HIGH (ref 0.44–1.00)
GFR calc Af Amer: 56 mL/min — ABNORMAL LOW (ref >60)
GFR calc non Af Amer: 48 mL/min — ABNORMAL LOW (ref >60)
Glucose, Bld: 123 mg/dL — ABNORMAL HIGH (ref 70–99)
Potassium: 3.6 mmol/L (ref 3.5–5.1)
Sodium: 139 mmol/L (ref 135–145)

## 2019-03-23 LAB — CBC
HCT: 27.9 % — ABNORMAL LOW (ref 36.0–46.0)
Hemoglobin: 9.7 g/dL — ABNORMAL LOW (ref 12.0–15.0)
MCH: 33.8 pg (ref 26.0–34.0)
MCHC: 34.8 g/dL (ref 30.0–36.0)
MCV: 97.2 fL (ref 80.0–100.0)
Platelets: 90 10*3/uL — ABNORMAL LOW (ref 150–400)
RBC: 2.87 MIL/uL — ABNORMAL LOW (ref 3.87–5.11)
RDW: 13.8 % (ref 11.5–15.5)
WBC: 6 10*3/uL (ref 4.0–10.5)
nRBC: 0 % (ref 0.0–0.2)

## 2019-03-23 LAB — HEMOGLOBIN A1C: Hgb A1c MFr Bld: UNDETERMINED % (ref 4.8–5.6)

## 2019-03-23 MED ORDER — CEFAZOLIN SODIUM-DEXTROSE 2-4 GM/100ML-% IV SOLN
2.0000 g | Freq: Three times a day (TID) | INTRAVENOUS | Status: AC
  Administered 2019-03-23 – 2019-03-26 (×11): 2 g via INTRAVENOUS
  Filled 2019-03-23 (×13): qty 100

## 2019-03-23 NOTE — Progress Notes (Addendum)
STROKE TEAM PROGRESS NOTE   INTERVAL HISTORY Patient RN at bedside.  Patient lying in bed, more awake alert, interactive than yesterday. Orientated to self and place, as per RN she knew she is 85 yo, however, she was not able to tell me her age. She had fever 101.8 at midnight but afebrile this am. On Abx, will change from rocephin to cefazolin.     Vitals:   03/23/19 0730 03/23/19 0800 03/23/19 0900 03/23/19 1000  BP: (!) 168/63 (!) 147/61 (!) 128/53 (!) 138/56  Pulse: 75 81 75 72  Resp: (!) 21 20 (!) 42 20  Temp:  97.9 F (36.6 C)    TempSrc:  Oral    SpO2: 98% 97% 100% 98%  Weight:      Height:        CBC:  Recent Labs  Lab 03/17/19 1811  03/22/19 0602 03/23/19 0117  WBC 8.4   < > 6.8 6.0  NEUTROABS 7.3  --   --   --   HGB 13.3   < > 10.7* 9.7*  HCT 40.0   < > 31.8* 27.9*  MCV 98.5   < > 97.8 97.2  PLT 122*   < > 60* 90*   < > = values in this interval not displayed.    Basic Metabolic Panel:  Recent Labs  Lab 03/17/19 1922  03/22/19 0602 03/23/19 0117  NA  --    < > 142 139  K  --    < > 3.8 3.6  CL  --    < > 108 107  CO2  --    < > 23 24  GLUCOSE  --    < > 110* 123*  BUN  --    < > 26* 31*  CREATININE  --    < > 0.97 1.03*  CALCIUM  --    < > 8.0* 7.6*  MG 1.7  --   --   --    < > = values in this interval not displayed.   Lipid Panel:     Component Value Date/Time   CHOL 185 03/19/2019 0621   CHOL 140 12/31/2016 1409   TRIG 146 03/19/2019 0621   HDL 43 03/19/2019 0621   HDL 61 12/31/2016 1409   CHOLHDL 4.3 03/19/2019 0621   VLDL 29 03/19/2019 0621   LDLCALC 113 (H) 03/19/2019 0621   LDLCALC 68 12/31/2016 1409   HgbA1c:  Lab Results  Component Value Date   HGBA1C QUANTITY NOT SUFFICIENT, UNABLE TO PERFORM TEST 03/19/2019   Urine Drug Screen: No results found for: LABOPIA, COCAINSCRNUR, LABBENZ, AMPHETMU, THCU, LABBARB  Alcohol Level     Component Value Date/Time   ETH <10 03/17/2019 1925    IMAGING  Ct Head Wo Contrast 03/17/2019    1. Large intraventricular hemorrhage with ventricular dilatation. 2. 9 millimeter intraparenchymal hemorrhage within the frontal lobe periventricular white matter, adjacent to the body of the LEFT LATERAL ventricle. 3. Atrophy and small vessel disease. 4. Significant scalp edema and hematoma along the vertex, not associated with underlying fracture.   CT Head WO Contrast 03/21/19 IMPRESSION: 1. Unchanged appearance of large volume intraventricular and small volume extra-axial subarachnoid hemorrhage. No new hemorrhagic site. 2. Slight decrease in size of the lateral ventricles compared to the prior study. 3. Large scalp vertex hematoma.  Ct Cervical Spine Wo Contrast 03/17/2019   Significant stable degenerative changes throughout the cervical spine. No evidence for acute abnormality.   Ct Angio Head  W Or Wo Contrast Ct Angio Neck W Or Wo Contrast 03/18/2019 No evidence of underlying vascular lesion in the region of the deep brain hemorrhage on the left. Advanced aortic atherosclerosis. Atherosclerotic disease at both carotid bifurcations with maximal stenosis of 60% on each side. 30-50% stenosis at both vertebral artery origins. 50% or less stenosis in both carotid siphon regions. 70% or greater stenosis of the right vertebral artery V4 segment. 50-70% stenosis of the left vertebral artery V4 segment.   Mr Brain Wo Contrast 03/19/2019 1. Stable large volume of intraventricular hemorrhage, although superimposed subarachnoid hemorrhage volume appears increased from the CT on 03/17/2019 suggesting a degree of continued bleeding.  2. Stable ventriculomegaly, with mild transependymal edema. No significant midline shift. Basilar cisterns remain patent.  3. As before, the hemorrhage might have originated in the left periventricular white matter. No superimposed acute infarct. No underlying lesion is identified in the absence of IV contrast.  4. Scalp hematoma at the vertex re-identified.    2D  Echocardiogram  1. Left ventricular ejection fraction, by visual estimation, is 60 to 65%. The left ventricle has normal function. Normal left ventricular size. Left ventricular septal wall thickness was mildly increased. Mildly increased left ventricular posterior wall thickness. There is mildly increased left ventricular hypertrophy.  2. Elevated left ventricular end-diastolic pressure.  3. Left ventricular diastolic Doppler parameters are consistent with pseudonormalization pattern of LV diastolic filling.  4. Global right ventricle has normal systolic function.The right ventricular size is normal. No increase in right ventricular wall thickness.  5. Left atrial size was normal.  6. Right atrial size was normal.  7. The mitral valve is normal in structure. Trace mitral valve regurgitation. No evidence of mitral stenosis.  8. The tricuspid valve is normal in structure. Tricuspid valve regurgitation is trivial.  9. The aortic valve The aortic valve is normal in structure. Aortic valve regurgitation is mild by color flow Doppler. Structurally normal aortic valve, with no evidence of sclerosis or stenosis. 10. The pulmonic valve was normal in structure. Pulmonic valve regurgitation is not visualized by color flow Doppler. 11. Mildly elevated pulmonary artery systolic pressure. 12. The inferior vena cava is normal in size with greater than 50% respiratory variability, suggesting right atrial pressure of 3 mmHg.  Dg Chest Port 1 View 03/17/2019 Cardiomegaly with mild interstitial edema.   DG Chest - Portable 1 View 03/20/2019 1. Largely resolved vascular congestion and interstitial edema.   PHYSICAL EXAM     Temp:  [97.9 F (36.6 C)-101.8 F (38.8 C)] 97.9 F (36.6 C) (09/22 0800) Pulse Rate:  [72-87] 72 (09/22 1000) Resp:  [17-42] 20 (09/22 1000) BP: (105-168)/(38-81) 138/56 (09/22 1000) SpO2:  [95 %-100 %] 98 % (09/22 1000)  General - Well nourished, well developed, lethargic, more  awake and interactive.  Ophthalmologic - fundi not visualized due to noncooperation.  Cardiovascular - Regular rate and rhythm.  Neuro - lethargic, more awake alert and interactive than yesterday, mild eye opening apraxia. Only orientated to self and place, not to age or time. No gaze preference, attending to both sides, but seems to have left INO. B/l lower eyelid ectropion. Blinking to visual threat bilaterally. Left mild facial droop. Tongue midline. LUE 3+/5 and RUE 3/5. However, BLE 0/5 proximal and 2+/5 withdraw on pain on the left and right. BLE also increase muscle tone. Sensation, coordination not cooperative and gait not tested.   ASSESSMENT/PLAN Jane [name] is a 85 y.o. female with history of HTN, HLD, previous  stroke, CAD, CHF, CKD presenting after being found down with severe HTN, L gaze deviation, not following commands. CT shows hemorrhage.   ICH: L frontal small periventricular hemorrhage with extensive IVH likely secondary to uncontrolled hypertension  CT C-Spine degenerative changes, no acute abnormality  CT head large IVH w/ ventricular dilatation. 9 mm L frontal lobe. Sign scalp edema and hematoma along vertex, no fx.  CTA head & neck no underlying vascular lesion for L hemorrhage. Atherosclerosis B ICA bifurcations mas 60%, 70% or more R V4, 50-70% L V4.  MRI  Stable IVH, increased SAH. Hemorrhage originiated from L periventricular white matter. Stable ventriculomegaly, no midline shift. No infarct. Scalp hematoma  CT head 03/21/19 stable IVH and SAH  2D Echo EF 60-65%. No source of embolus   LDL 113  HgbA1c - pending  Heparin subq for VTE prophylaxis  aspirin 81 mg daily prior to admission, now on No antithrombotic given hemorrhage   Therapy recommendations: SNF  Disposition:  pending  Hypertensive Emergency  Home meds:  Lasix 20, isosorbide 30, losartan 100, zebeta 5mg   BP 234/127 on arrival  SBP goal < 160   Off Cleviprex now  On prn  labetalol  Resume home meds and increase zebeta to 10mg  bid . Long-term BP goal normotensive  CHF with pulmonary edema  CXR Cardiomegaly with mild interstitial edema  Resume lasix  Decreased IVF from 60->30  CXR repeat Largely resolved vascular congestion and interstitial edema  Repeat CXR mild cardiomegaly, vascular congestioin  Hyperlipidemia  Home meds:  lipitor 40  LDL 113  Hold lipitor given ICH  Consider continuation of statin at discharge  Dysphagia . Secondary to stroke . dys1 and thin liquid during the day . Poor po intake . On TF @ 75 at night and free water 200 Q6 . Has Cortrak . Speech on board  E.Coli bacteremia with fever  Tmax 101.8  Blood culture 1/2 E. Coli - sensitive to rocephin  IV Rocephin started 03/20/19 - 7 day course  U/A neg  WBC - 9.0-> 4.7->6.8->6.0   Curbside with CCM, procalcitonin neg, LA 2.3, and repeat BCx no growth x 2 days   Rocephin changed to Ancef 9/22>>9/26  AKI on CKD stage III  Cre 1.18->1.05->1.18->0.97->1.03   Na 143->146->142->139  Continue IVF and TF  On free water 200 Q6  Close monitoring  Thrombocytopenia   Platelet 122->93->80->73->60->90    CBC monitoring  Other Stroke Risk Factors  Advanced age  Former Cigarette smoker  Hx stroke/TIA - Documented hx stroke but not details in Epic (occurred prior to 2014)  Coronary artery disease  Other Active Problems  Found down. Collar placed. CT CS cleared bones. Discussed with trauma - literature now ok to remove collar with neg films. Collar d/c'ed.   Hypokalemia 3.2, replace - 3.8->3.5->3.6->3.8->3.6   Hx temporal arteritis 11/2012  Hospital day # 6  This patient is critically ill and at significant risk of neurological worsening, death and care requires constant monitoring of vital signs, hemodynamics,respiratory and cardiac monitoring, extensive review of multiple databases, frequent neurological assessment, discussion with family, other  specialists and medical decision making of high complexity. I spent 35 minutes of neurocritical care time in the care of  this patient.   Rosalin Hawking, MD PhD Stroke Neurology 03/23/2019 11:39 AM   To contact Stroke Continuity provider, please refer to [email or web address] After hours, contact General Neurology

## 2019-03-24 DIAGNOSIS — I161 Hypertensive emergency: Secondary | ICD-10-CM

## 2019-03-24 DIAGNOSIS — N179 Acute kidney failure, unspecified: Secondary | ICD-10-CM

## 2019-03-24 LAB — CBC
HCT: 30.9 % — ABNORMAL LOW (ref 36.0–46.0)
Hemoglobin: 10.5 g/dL — ABNORMAL LOW (ref 12.0–15.0)
MCH: 33.4 pg (ref 26.0–34.0)
MCHC: 34 g/dL (ref 30.0–36.0)
MCV: 98.4 fL (ref 80.0–100.0)
Platelets: 80 10*3/uL — ABNORMAL LOW (ref 150–400)
RBC: 3.14 MIL/uL — ABNORMAL LOW (ref 3.87–5.11)
RDW: 13.8 % (ref 11.5–15.5)
WBC: 5.3 10*3/uL (ref 4.0–10.5)
nRBC: 0.4 % — ABNORMAL HIGH (ref 0.0–0.2)

## 2019-03-24 LAB — URINALYSIS, ROUTINE W REFLEX MICROSCOPIC
Bilirubin Urine: NEGATIVE
Glucose, UA: NEGATIVE mg/dL
Hgb urine dipstick: NEGATIVE
Ketones, ur: NEGATIVE mg/dL
Nitrite: NEGATIVE
Protein, ur: >=300 mg/dL — AB
Specific Gravity, Urine: 1.02 (ref 1.005–1.030)
pH: 6 (ref 5.0–8.0)

## 2019-03-24 LAB — GLUCOSE, CAPILLARY
Glucose-Capillary: 109 mg/dL — ABNORMAL HIGH (ref 70–99)
Glucose-Capillary: 119 mg/dL — ABNORMAL HIGH (ref 70–99)
Glucose-Capillary: 133 mg/dL — ABNORMAL HIGH (ref 70–99)
Glucose-Capillary: 119 mg/dL — ABNORMAL HIGH (ref 70–99)
Glucose-Capillary: 145 mg/dL — ABNORMAL HIGH (ref 70–99)

## 2019-03-24 LAB — BASIC METABOLIC PANEL
Anion gap: 12 (ref 5–15)
BUN: 38 mg/dL — ABNORMAL HIGH (ref 8–23)
CO2: 20 mmol/L — ABNORMAL LOW (ref 22–32)
Calcium: 7.9 mg/dL — ABNORMAL LOW (ref 8.9–10.3)
Chloride: 101 mmol/L (ref 98–111)
Creatinine, Ser: 0.99 mg/dL (ref 0.44–1.00)
GFR calc Af Amer: 59 mL/min — ABNORMAL LOW (ref >60)
GFR calc non Af Amer: 51 mL/min — ABNORMAL LOW (ref >60)
Glucose, Bld: 118 mg/dL — ABNORMAL HIGH (ref 70–99)
Potassium: 4.2 mmol/L (ref 3.5–5.1)
Sodium: 133 mmol/L — ABNORMAL LOW (ref 135–145)

## 2019-03-24 LAB — HEMOGLOBIN A1C
Hgb A1c MFr Bld: 5.2 % (ref 4.8–5.6)
Mean Plasma Glucose: 103 mg/dL

## 2019-03-24 MED ORDER — AMLODIPINE BESYLATE 10 MG PO TABS
10.0000 mg | ORAL_TABLET | Freq: Every day | ORAL | Status: DC
  Administered 2019-03-24 – 2019-03-27 (×4): 10 mg via ORAL
  Filled 2019-03-24 (×4): qty 1

## 2019-03-24 NOTE — TOC Initial Note (Signed)
Transition of Care [facility]) - Initial/Assessment Note    Patient Details  Name: Jane [name] MRN: [id number] Date of Birth: 1928-12-24  Transition of Care [facility]) CM/SW Contact:    Pollie Friar, RN Phone Number: 03/24/2019, 1:20 PM  Clinical Narrative:                 CM met with the patient and she is aphasic and confused. CM reached out to her contact: [name] her nephew and he states he would be her person to assist in discharge planning. [name] is in agreement with SNF rehab and states patient has been to [facility] a few times before and that would be his first choice. He is agreeable for her being faxed out in the [place] area. FL2 completed and faxed out. TOC following.  Expected Discharge Plan: Skilled Nursing Facility Barriers to Discharge: Continued Medical Work up, SNF Pending bed offer   Patient Goals and CMS Choice   CMS Medicare.gov Compare Post Acute Care list provided to:: Patient Represenative (must comment) Choice offered to / list presented to : (nephew)  Expected Discharge Plan and Services Expected Discharge Plan: [place] In-house Referral: Clinical Social Work Discharge Planning Services: CM Consult Post Acute Care Choice: [place] Living arrangements for the past 2 months: [place]                                      Prior Living Arrangements/Services Living arrangements for the past 2 months: Single Family Home Lives with:: Self Patient language and need for interpreter reviewed:: Yes(no needs)        Need for Family Participation in Patient Care: Yes (Comment)(needs 24 hour support) Care giver support system in place?: No (comment)   Criminal Activity/Legal Involvement Pertinent to Current Situation/Hospitalization: No - Comment as needed  Activities of Daily Living      Permission Sought/Granted                  Emotional Assessment Appearance:: Appears stated  age Attitude/Demeanor/Rapport: Lethargic   Orientation: : Oriented to Self   Psych Involvement: No (comment)  Admission diagnosis:  Intraparenchymal hemorrhage of brain ([place]) [I61.9] Altered mental status, unspecified altered mental status type [R41.82] Patient Active Problem List   Diagnosis Date Noted  . IVH (intraventricular hemorrhage) ([place]) 03/19/2019  . Cytotoxic brain edema ([place]) 03/19/2019  . Pressure injury of skin 03/18/2019  . Stroke (cerebrum) ([place]) 03/17/2019  . Urge incontinence 11/18/2018  . Acute exacerbation of CHF (congestive heart failure) ([place]) 10/20/2018  . Acute on chronic heart failure with preserved ejection fraction (HFpEF) ([place]) 07/25/2018  . Hypokalemia 07/25/2018  . Cellulitis of left lower extremity   . Toenail deformity 03/19/2018  . Skin lesion of scalp 03/19/2018  . CAD (coronary artery disease) 08/25/2017  . Unsteady gait 08/11/2016  . Abdominal aortic aneurysm ([place]) 05/21/2016  . Trochanteric bursitis of left hip   . Pancytopenia ([place]) 12/27/2015  . CKD (chronic kidney disease) 03/31/2015  . Prediabetes 10/16/2014  . Status post insertion of drug-eluting stent into right coronary artery for coronary artery disease 08/07/2014  . SOB (shortness of breath) 05/31/2014  . Physical deconditioning 05/31/2014  . Heart failure with preserved ejection fraction ([place]) 12/27/2012  . Ectropion of left lower eyelid 12/17/2012  . Onychomycosis of toenail 09/18/2012  . PAD (peripheral artery disease) ([place]) 09/18/2012  . Preventative health care 09/18/2012  .  Vitamin D deficiency 04/25/2008  . History of cerebrovascular accident 10/29/2007  . Hyperlipidemia 09/24/2006  . Normocytic anemia 09/24/2006  . Hypertension 09/24/2006  . OSTEOPENIA 09/24/2006  . Vitamin B12 deficiency 08/29/2006   PCP:  Ina Homes, MD Pharmacy:   [facility] [place], [address] [place] [address] Phone: [phone] Fax: [phone]     Social Determinants of Health (SDOH) Interventions    Readmission Risk Interventions No flowsheet data found.

## 2019-03-24 NOTE — NC FL2 (Signed)
[place] LEVEL OF CARE SCREENING TOOL     IDENTIFICATION  Patient Name: Jane [name] Birthdate: 07-21-1928 Sex: female Admission Date (Current Location): 03/17/2019  [facility] and [place] Number:  Herbalist and Address:  [facility]. [facility], [place] [address], [place], [place] [address]      Provider Number: [id number]  Attending Physician Name and Address:  Hosie Poisson, MD  Relative Name and Phone Number:       Current Level of Care: Hospital Recommended Level of Care: [place] Prior Approval Number:    Date Approved/Denied:   PASRR Number: [id number]  Discharge Plan: SNF    Current Diagnoses: Patient Active Problem List   Diagnosis Date Noted  . IVH (intraventricular hemorrhage) ([place]) 03/19/2019  . Cytotoxic brain edema ([place]) 03/19/2019  . Pressure injury of skin 03/18/2019  . Stroke (cerebrum) ([place]) 03/17/2019  . Urge incontinence 11/18/2018  . Acute exacerbation of CHF (congestive heart failure) ([place]) 10/20/2018  . Acute on chronic heart failure with preserved ejection fraction (HFpEF) ([place]) 07/25/2018  . Hypokalemia 07/25/2018  . Cellulitis of left lower extremity   . Toenail deformity 03/19/2018  . Skin lesion of scalp 03/19/2018  . CAD (coronary artery disease) 08/25/2017  . Unsteady gait 08/11/2016  . Abdominal aortic aneurysm ([place]) 05/21/2016  . Trochanteric bursitis of left hip   . Pancytopenia ([place]) 12/27/2015  . CKD (chronic kidney disease) 03/31/2015  . Prediabetes 10/16/2014  . Status post insertion of drug-eluting stent into right coronary artery for coronary artery disease 08/07/2014  . SOB (shortness of breath) 05/31/2014  . Physical deconditioning 05/31/2014  . Heart failure with preserved ejection fraction ([place]) 12/27/2012  . Ectropion of left lower eyelid 12/17/2012  . Onychomycosis of toenail 09/18/2012  . PAD (peripheral artery disease) ([place]) 09/18/2012  . Preventative health care  09/18/2012  . Vitamin D deficiency 04/25/2008  . History of cerebrovascular accident 10/29/2007  . Hyperlipidemia 09/24/2006  . Normocytic anemia 09/24/2006  . Hypertension 09/24/2006  . OSTEOPENIA 09/24/2006  . Vitamin B12 deficiency 08/29/2006    Orientation RESPIRATION BLADDER Height & Weight     Self  Normal Incontinent Weight: 65.3 kg Height:  5' (152.4 cm)(previous encounter)  BEHAVIORAL SYMPTOMS/MOOD NEUROLOGICAL BOWEL NUTRITION STATUS      Incontinent (on tube feedings via Cortrak here that will be stopped prior to d/c)  AMBULATORY STATUS COMMUNICATION OF NEEDS Skin   Total Care Verbally Skin abrasions(skin abrasions to Rt knee and shoulder/ Stage I to sacrum with foam dressing/ MASD to lt groin with cream)                       Personal Care Assistance Level of Assistance  Bathing, Feeding, Dressing Bathing Assistance: Maximum assistance Feeding assistance: Limited assistance Dressing Assistance: Maximum assistance     Functional Limitations Info  Sight, Hearing, Speech Sight Info: Impaired Hearing Info: Adequate Speech Info: Impaired    SPECIAL CARE FACTORS FREQUENCY  PT (By licensed PT), OT (By licensed OT), Speech therapy     PT Frequency: 5x/wk OT Frequency: 5x/wk     Speech Therapy Frequency: 5x/wk      Contractures Contractures Info: Not present    Additional Factors Info  Code Status, Allergies Code Status Info: Full Allergies Info: NKA           Current Medications (03/24/2019):  This is the current hospital active medication list Current Facility-Administered Medications  Medication Dose Route Frequency Provider Last  Rate Last Dose  . 0.9 %  sodium chloride infusion   Intravenous PRN Garvin Fila, MD 10 mL/hr at 03/18/19 1423    . acetaminophen (TYLENOL) tablet 650 mg  650 mg Oral Q4H PRN Greta Doom, MD   650 mg at 03/23/19 0047   Or  . acetaminophen (TYLENOL) solution 650 mg  650 mg Per Tube Q4H PRN Greta Doom, MD   650 mg at 03/23/19 1815   Or  . acetaminophen (TYLENOL) suppository 650 mg  650 mg Rectal Q4H PRN Greta Doom, MD      . amLODipine (NORVASC) tablet 10 mg  10 mg Oral Daily Rosalin [place], MD   10 mg at 03/24/19 0910  . bisoprolol (ZEBETA) tablet 10 mg  10 mg Per Tube BID Rosalin [place], MD   10 mg at 03/24/19 0910  . ceFAZolin (ANCEF) IVPB 2g/100 mL premix  2 g Intravenous [address] RPH 200 mL/hr at 03/24/19 0912 2 g at 03/24/19 0912  . chlorhexidine (PERIDEX) 0.12 % solution 15 mL  15 mL Mouth Rinse BID Garvin Fila, MD   15 mL at 03/24/19 1000  . Chlorhexidine Gluconate Cloth 2 % PADS 6 each  6 each Topical Daily Garvin Fila, MD   6 each at 03/22/19 0800  . feeding supplement (ENSURE ENLIVE) (ENSURE ENLIVE) liquid 237 mL  237 mL Oral BID BM Rosalin [place], MD   237 mL at 03/23/19 1420  . feeding supplement (JEVITY 1.2 CAL) liquid 1,000 mL  1,000 mL Per Tube Continuous Rosalin [place], MD   Stopped at 03/23/19 0636  . feeding supplement (PRO-STAT SUGAR FREE 64) liquid 30 mL  30 mL Per Tube BID Rosalin [place], MD   30 mL at 03/24/19 0909  . free water 200 mL  200 mL Per Tube Q6H Rosalin [place], MD   200 mL at 03/24/19 1120  . furosemide (LASIX) tablet 20 mg  20 mg Oral Daily Rosalin [place], MD   20 mg at 03/24/19 0910  . hydroxypropyl methylcellulose / hypromellose (ISOPTO TEARS / GONIOVISC) 2.5 % ophthalmic solution 1 drop  1 drop Both Eyes QID Rosalin [place], MD   1 drop at 03/24/19 0910  . isosorbide mononitrate (IMDUR) 24 hr tablet 30 mg  30 mg Oral Daily Greta Doom, MD   30 mg at 03/24/19 0909  . labetalol (NORMODYNE) injection 10-20 mg  10-20 mg Intravenous Q10 min PRN Donzetta Starch, NP   20 mg at 03/24/19 0356  . losartan (COZAAR) tablet 50 mg  50 mg Per Tube BID Rosalin [place], MD   50 mg at 03/24/19 0910  . MEDLINE mouth rinse  15 mL Mouth Rinse q12n4p Garvin Fila, MD   15 mL at 03/24/19 1120  . pantoprazole sodium (PROTONIX) 40 mg/20 mL oral suspension  40 mg  40 mg Per Tube Daily Rosalin [place], MD   40 mg at 03/24/19 0910  . senna-docusate (Senokot-S) tablet 1 tablet  1 tablet Per Tube BID Rosalin [place], MD   1 tablet at 03/24/19 0909     Discharge Medications: Please see discharge summary for a list of discharge medications.  Relevant Imaging Results:  Relevant Lab Results:   Additional Information SS#: [id number]  Pollie Friar, RN

## 2019-03-24 NOTE — Progress Notes (Signed)
PROGRESS NOTE    Jane [name]  [id number] DOB: July 22, 1928 DOA: 03/17/2019 PCP: Ina Homes, MD    Brief Narrative:   85 year old lady with prior h./o hypertension, Hyperlipidemia, CAD, diastolic heart failure, stage 3 CKD, presents with intracranial hemorrhage.    Assessment & Plan:   Active Problems:   Stroke (cerebrum) (HCC)   Pressure injury of skin   IVH (intraventricular hemorrhage) (HCC)   Cytotoxic brain edema (HCC)   Left frontal periventricular hemorrhage secondary to uncontrolled hypertension;   Reviewed CT head and CTA Of the head and neck .  MRI of the brain showed stable IVH, increased SAD, no midline shift, no infarct.   2 D echocardiogram showed LVEF of 60% to 65%. No source of embolus.   A!c is 5.2, LDL is 113.  Therapy eval recommending SNF.   Appreciate neurology and neurosurgery recommendations.    Hypertensive Emergency:  Resolved.  Continue the same regimen of BP meds amlodipine, bisoprolol, lasix and cozaar.     Pulmonary edema:  Resume LASIX.she denies any sob or chest pain at this time.    Hyperlipidemia:  Resume lipitor.    Dysphagia secondary to stroke.  Continue with dysphagia 1 diet.    Nutrition:  Has cortrak with tube feeds at 75 ml/hr with free water boluses.  Further management as per SLP.    Acute on STAGE 3 CKD:  Much improved hydration, continue with free water boluses.    Thrombocytopenia:  - worsening, unclear etiology . Stop the heparin sq and use scd's and monitor the platelet count.    E coli bacteremia:  Blood cultures from 9/16 showing E coli , repeat blood cultures negative.  Pt continues to be febrile , but wbc wnl.  procalcitonin Is negative.  Get UA.  Repeat blood cultures .  Pt has been on rocephin from 9/19 till 9/22 . Ancef from 9/22 till 9/26 for the duration of 7 days.     DVT prophylaxis: scds Code Status: full code.  Family Communication: none at bedside. Disposition Plan:  SNF when stable.       Neuro surgery.    Procedures: MRI brain  CTA of the head and neck.   Antimicrobials: ancef from 9/22 till 9/26  Subjective: No chest pain or sob.   Objective: Vitals:   03/23/19 1954 03/23/19 2311 03/24/19 0312 03/24/19 0532  BP: (!) 157/63 (!) 173/67 (!) 179/65 (!) 147/61  Pulse: 66 65 65 69  Resp: 19 17 14    Temp: 98.3 F (36.8 C) 97.6 F (36.4 C) 97.7 F (36.5 C)   TempSrc: Oral Oral Oral   SpO2: 98% 100% 99%   Weight:      Height:        Intake/Output Summary (Last 24 hours) at 03/24/2019 0744 Last data filed at 03/24/2019 [id number] Gross per 24 hour  Intake 1024 ml  Output 1290 ml  Net -266 ml   Filed Weights   03/21/19 0500  Weight: 65.3 kg    Examination:  General exam: ill appearing elderly lady, with cortrak. Respiratory system: Clear to auscultation. Respiratory effort normal. Cardiovascular system: S1 & S2 heard, RRR. No pedal edema.  Gastrointestinal system: Abdomen is nondistended, soft and nontender. No organomegaly or masses felt. Normal bowel sounds heard. Central nervous system: Alert and answering simple questions like her name. Oriented to person only.  Extremities: no pedal edema.  Skin: No rashes, lesions or ulcers Psychiatry: cannot be assessed    Data Reviewed: I have  personally reviewed following labs and imaging studies  CBC: Recent Labs  Lab 03/17/19 1811 03/19/19 0621 03/20/19 0545 03/21/19 0332 03/22/19 0602 03/23/19 0117  WBC 8.4 9.0 6.6 4.7 6.8 6.0  NEUTROABS 7.3  --   --   --   --   --   HGB 13.3 11.6* 10.5* 10.0* 10.7* 9.7*  HCT 40.0 32.8* 30.5* 29.7* 31.8* 27.9*  MCV 98.5 96.5 97.8 99.0 97.8 97.2  PLT 122* 93* 80* 73* 60* 90*   Basic Metabolic Panel: Recent Labs  Lab 03/17/19 1922  03/20/19 0545 03/21/19 0332 03/22/19 0602 03/23/19 0117 03/24/19 0512  NA  --    < > 143 146* 142 139 133*  K  --    < > 3.5 3.6 3.8 3.6 4.2  CL  --    < > 115* 116* 108 107 101  CO2  --    < > 17* 20*  23 24 20*  GLUCOSE  --    < > 146* 105* 110* 123* 118*  BUN  --    < > 31* 33* 26* 31* 38*  CREATININE  --    < > 1.05* 1.18* 0.97 1.03* 0.99  CALCIUM  --    < > 8.1* 8.2* 8.0* 7.6* 7.9*  MG 1.7  --   --   --   --   --   --    < > = values in this interval not displayed.   GFR: Estimated Creatinine Clearance: 33.1 mL/min (by C-G formula based on SCr of 0.99 mg/dL). Liver Function Tests: Recent Labs  Lab 03/17/19 1811  AST 34  ALT 17  ALKPHOS 60  BILITOT 2.4*  PROT 6.7  ALBUMIN 3.5   No results for input(s): LIPASE, AMYLASE in the last 168 hours. No results for input(s): AMMONIA in the last 168 hours. Coagulation Profile: Recent Labs  Lab 03/17/19 1922  INR 1.3*   Cardiac Enzymes: Recent Labs  Lab 03/17/19 1811  CKTOTAL 629*   BNP (last 3 results) No results for input(s): PROBNP in the last 8760 hours. HbA1C: Recent Labs    03/23/19 1129  HGBA1C 5.2   CBG: Recent Labs  Lab 03/23/19 0800 03/23/19 1405 03/23/19 1951 03/23/19 2311 03/24/19 0310  GLUCAP 106* 125* 103* 130* 109*   Lipid Profile: No results for input(s): CHOL, HDL, LDLCALC, TRIG, CHOLHDL, LDLDIRECT in the last 72 hours. Thyroid Function Tests: No results for input(s): TSH, T4TOTAL, FREET4, T3FREE, THYROIDAB in the last 72 hours. Anemia Panel: No results for input(s): VITAMINB12, FOLATE, FERRITIN, TIBC, IRON, RETICCTPCT in the last 72 hours. Sepsis Labs: Recent Labs  Lab 03/17/19 1804 03/18/19 0041 03/21/19 0943 03/22/19 0602  PROCALCITON  --   --  <0.10 <0.10  LATICACIDVEN 2.5* 2.8* 2.3*  --     Recent Results (from the past 240 hour(s))  Culture, blood (routine x 2)     Status: Abnormal   Collection Time: 03/17/19  4:10 PM   Specimen: BLOOD  Result Value Ref Range Status   Specimen Description BLOOD LEFT ANTECUBITAL  Final   Special Requests   Final    BOTTLES DRAWN AEROBIC AND ANAEROBIC Blood Culture results may not be optimal due to an inadequate volume of blood received in  culture bottles   Culture  Setup Time   Final    GRAM POSITIVE COCCI IN BOTH AEROBIC AND ANAEROBIC BOTTLES CRITICAL RESULT CALLED TO, READ BACK BY AND VERIFIED WITH: PHARMD T [place] [id number] AT 1330BY DV  Culture (A)  Final    STAPHYLOCOCCUS SPECIES (COAGULASE NEGATIVE) THE SIGNIFICANCE OF ISOLATING THIS ORGANISM FROM A SINGLE SET OF BLOOD CULTURES WHEN MULTIPLE SETS ARE DRAWN IS UNCERTAIN. PLEASE NOTIFY THE MICROBIOLOGY DEPARTMENT WITHIN ONE WEEK IF SPECIATION AND SENSITIVITIES ARE REQUIRED. ESCHERICHIA COLI CRITICAL RESULT CALLED TO, READ BACK BY AND VERIFIED WITH: Sharen Heck PHARMD, AT [id number] 03/20/19 REGARDING GRAM NEGATIVE ROD Performed at [facility], [place] [address] [place], [place] [address]    Report Status 03/21/2019 FINAL  Final   Organism ID, Bacteria ESCHERICHIA COLI  Final      Susceptibility   Escherichia coli - MIC*    AMPICILLIN >=32 RESISTANT Resistant     CEFAZOLIN <=4 SENSITIVE Sensitive     CEFEPIME <=1 SENSITIVE Sensitive     CEFTAZIDIME <=1 SENSITIVE Sensitive     CEFTRIAXONE <=1 SENSITIVE Sensitive     CIPROFLOXACIN >=4 RESISTANT Resistant     GENTAMICIN >=16 RESISTANT Resistant     IMIPENEM <=0.25 SENSITIVE Sensitive     TRIMETH/SULFA >=320 RESISTANT Resistant     AMPICILLIN/SULBACTAM 16 INTERMEDIATE Intermediate     PIP/TAZO <=4 SENSITIVE Sensitive     Extended ESBL NEGATIVE Sensitive     * ESCHERICHIA COLI  Blood Culture ID Panel (Reflexed)     Status: Abnormal   Collection Time: 03/17/19  4:10 PM  Result Value Ref Range Status   Enterococcus species NOT DETECTED NOT DETECTED Final   Listeria monocytogenes NOT DETECTED NOT DETECTED Final   Staphylococcus species DETECTED (A) NOT DETECTED Final    Comment: Methicillin (oxacillin) resistant coagulase negative staphylococcus. Possible blood culture contaminant (unless isolated from more than one blood culture draw or clinical case suggests pathogenicity). No antibiotic treatment is indicated for  blood  culture contaminants. CRITICAL RESULT CALLED TO, READ BACK BY AND VERIFIED WITH: PHARMD T [place] [id number] AT 1330 BY DV    Staphylococcus aureus (BCID) NOT DETECTED NOT DETECTED Final   Methicillin resistance DETECTED (A) NOT DETECTED Final    Comment: CRITICAL RESULT CALLED TO, READ BACK BY AND VERIFIED WITH: PHARMD T [place] [id number] AT 1330 BY DV    Streptococcus species NOT DETECTED NOT DETECTED Final   Streptococcus agalactiae NOT DETECTED NOT DETECTED Final   Streptococcus pneumoniae NOT DETECTED NOT DETECTED Final   Streptococcus pyogenes NOT DETECTED NOT DETECTED Final   Acinetobacter baumannii NOT DETECTED NOT DETECTED Final   Enterobacteriaceae species NOT DETECTED NOT DETECTED Final   Enterobacter cloacae complex NOT DETECTED NOT DETECTED Final   Escherichia coli NOT DETECTED NOT DETECTED Final   Klebsiella oxytoca NOT DETECTED NOT DETECTED Final   Klebsiella pneumoniae NOT DETECTED NOT DETECTED Final   Proteus species NOT DETECTED NOT DETECTED Final   Serratia marcescens NOT DETECTED NOT DETECTED Final   Haemophilus influenzae NOT DETECTED NOT DETECTED Final   Neisseria meningitidis NOT DETECTED NOT DETECTED Final   Pseudomonas aeruginosa NOT DETECTED NOT DETECTED Final   Candida albicans NOT DETECTED NOT DETECTED Final   Candida glabrata NOT DETECTED NOT DETECTED Final   Candida krusei NOT DETECTED NOT DETECTED Final   Candida parapsilosis NOT DETECTED NOT DETECTED Final   Candida tropicalis NOT DETECTED NOT DETECTED Final    Comment: Performed at [facility], [address] [place], [place] [address]  Culture, blood (routine x 2)     Status: None   Collection Time: 03/17/19  7:15 PM   Specimen: BLOOD  Result Value Ref Range Status   Specimen Description BLOOD RIGHT ANTECUBITAL  Final   Special Requests   Final    BOTTLES DRAWN AEROBIC ONLY Blood Culture results may not be optimal due to an inadequate volume of blood received in culture bottles    Culture   Final    NO GROWTH 5 DAYS Performed at [facility], [place] [address], [place], [place] [address]    Report Status 03/22/2019 FINAL  Final  SARS Coronavirus 2 [facility] order, Performed in [facility] hospital lab) Nasopharyngeal Nasopharyngeal Swab     Status: None   Collection Time: 03/17/19  7:37 PM   Specimen: Nasopharyngeal Swab  Result Value Ref Range Status   SARS Coronavirus 2 NEGATIVE NEGATIVE Final    Comment: (NOTE) If result is NEGATIVE SARS-CoV-2 target nucleic acids are NOT DETECTED. The SARS-CoV-2 RNA is generally detectable in upper and lower  respiratory specimens during the acute phase of infection. The lowest  concentration of SARS-CoV-2 viral copies this assay can detect is 250  copies / mL. A negative result does not preclude SARS-CoV-2 infection  and should not be used as the sole basis for treatment or other  patient management decisions.  A negative result may occur with  improper specimen collection / handling, submission of specimen other  than nasopharyngeal swab, presence of viral mutation(s) within the  areas targeted by this assay, and inadequate number of viral copies  (<250 copies / mL). A negative result must be combined with clinical  observations, patient history, and epidemiological information. If result is POSITIVE SARS-CoV-2 target nucleic acids are DETECTED. The SARS-CoV-2 RNA is generally detectable in upper and lower  respiratory specimens dur ing the acute phase of infection.  Positive  results are indicative of active infection with SARS-CoV-2.  Clinical  correlation with patient history and other diagnostic information is  necessary to determine patient infection status.  Positive results do  not rule out bacterial infection or co-infection with other viruses. If result is PRESUMPTIVE POSTIVE SARS-CoV-2 nucleic acids MAY BE PRESENT.   A presumptive positive result was obtained on the submitted specimen  and confirmed on  repeat testing.  While 2019 novel coronavirus  (SARS-CoV-2) nucleic acids may be present in the submitted sample  additional confirmatory testing may be necessary for epidemiological  and / or clinical management purposes  to differentiate between  SARS-CoV-2 and other Sarbecovirus currently known to infect humans.  If clinically indicated additional testing with an alternate test  methodology [phone]) is advised. The SARS-CoV-2 RNA is generally  detectable in upper and lower respiratory sp ecimens during the acute  phase of infection. The expected result is Negative. Fact Sheet for Patients:  StrictlyIdeas.no Fact Sheet for Healthcare Providers: BankingDealers.co.za This test is not yet approved or cleared by the [place] FDA and has been authorized for detection and/or diagnosis of SARS-CoV-2 by FDA under an Emergency Use Authorization (EUA).  This EUA will remain in effect (meaning this test can be used) for the duration of the COVID-19 declaration under Section 564(b)(1) of the Act, 21 U.S.C. section 360bbb-3(b)(1), unless the authorization is terminated or revoked sooner. Performed at [facility], [place] [address] [place], [place] [address]   MRSA PCR Screening     Status: None   Collection Time: 03/17/19 11:28 PM   Specimen: Nasopharyngeal  Result Value Ref Range Status   MRSA by PCR NEGATIVE NEGATIVE Final    Comment:        The GeneXpert MRSA Assay (FDA approved for NASAL specimens only), is one  component of a comprehensive MRSA colonization surveillance program. It is not intended to diagnose MRSA infection nor to guide or monitor treatment for MRSA infections. Performed at [facility], [place] [address] [place], [place] [address]   Urine culture     Status: None   Collection Time: 03/19/19 12:19 PM   Specimen: Urine, Random  Result Value Ref Range Status   Specimen Description URINE, RANDOM  Final    Special Requests NONE  Final   Culture   Final    NO GROWTH Performed at [facility], [place] [address] [place], [place] [address]    Report Status 03/20/2019 FINAL  Final  Culture, blood (Routine X 2) w Reflex to ID Panel     Status: None (Preliminary result)   Collection Time: 03/21/19 10:01 AM   Specimen: BLOOD RIGHT WRIST  Result Value Ref Range Status   Specimen Description BLOOD RIGHT WRIST  Final   Special Requests   Final    BOTTLES DRAWN AEROBIC ONLY Blood Culture results may not be optimal due to an inadequate volume of blood received in culture bottles   Culture   Final    NO GROWTH 2 DAYS Performed at [facility], [place] [address], [place], [place] [address]    Report Status PENDING  Incomplete  Culture, blood (Routine X 2) w Reflex to ID Panel     Status: None (Preliminary result)   Collection Time: 03/21/19 10:01 AM   Specimen: BLOOD RIGHT FOREARM  Result Value Ref Range Status   Specimen Description BLOOD RIGHT FOREARM  Final   Special Requests   Final    BOTTLES DRAWN AEROBIC ONLY Blood Culture results may not be optimal due to an inadequate volume of blood received in culture bottles   Culture   Final    NO GROWTH 2 DAYS Performed at [facility], [place] [address] [place], [place] [address]    Report Status PENDING  Incomplete         Radiology Studies: Dg Chest Port 1 View  Result Date: 03/22/2019 CLINICAL DATA:  Shortness of breath EXAM: PORTABLE CHEST 1 VIEW COMPARISON:  03/20/2019 FINDINGS: Cardiomegaly. Mild vascular congestion. No confluent opacities, effusions or overt edema. No acute bony abnormality. IMPRESSION: Mild cardiomegaly.  Vascular congestion. Electronically Signed   By: Rolm Baptise M.D.   On: 03/22/2019 19:01        Scheduled Meds: . bisoprolol  10 mg Per Tube BID  . chlorhexidine  15 mL Mouth Rinse BID  . Chlorhexidine Gluconate Cloth  6 each Topical Daily  . feeding supplement (ENSURE ENLIVE)  237 mL Oral BID BM   . feeding supplement (PRO-STAT SUGAR FREE 64)  30 mL Per Tube BID  . free water  200 mL Per Tube Q6H  . furosemide  20 mg Oral Daily  . heparin injection (subcutaneous)  5,000 Units Subcutaneous Q8H  . hydroxypropyl methylcellulose / hypromellose  1 drop Both Eyes QID  . isosorbide mononitrate  30 mg Oral Daily  . losartan  50 mg Per Tube BID  . mouth rinse  15 mL Mouth Rinse q12n4p  . pantoprazole sodium  40 mg Per Tube Daily  . senna-docusate  1 tablet Per Tube BID   Continuous Infusions: . sodium chloride 10 mL/hr at 03/18/19 1423  .  ceFAZolin (ANCEF) IV 2 g (03/24/19 0300)  . feeding supplement (JEVITY 1.2 CAL) Stopped (03/23/19 0636)     LOS: 7 days  Time spent: 29 minutes.     Hosie Poisson, MD [facility] Pager [phone]   If 7PM-7AM, please contact night-coverage [email or web address] Password [facility] 03/24/2019, 7:44 AM

## 2019-03-24 NOTE — Care Management Important Message (Signed)
Important Message  Patient Details  Name: ATHZIRY WYNNE MRN: [id number] Date of Birth: 03-18-1929   Medicare Important Message Given:  Yes     Shelda Altes 03/24/2019, 2:13 PM

## 2019-03-24 NOTE — Consult Note (Signed)
   [facility] CM Inpatient Consult   03/24/2019  AMAIA ARDOLINO 22-Jun-1929 [id number]  Medicare ACO: High risk for unplanned readmission score 25%, transfer from unit  Patient was assessed for [facility] for community services.  Patient was previously active with [facility].    1350: Chart review reveals patient is currently for a skilled nursing facility for disposition.  Notes reviewed from History and Physical from 03/17/2019 reveals patient is:  NABIL MOULIN is a 85 y.o. female who was found down by her maintenance man today.  She was last seen normal on Monday.  At baseline, she is able to manage by herself and lives somewhat independently day-to-day, but needs significant help with paying bills, grocery shopping, or other outside of the house activities. She was brought to the emergency department where head CT was performed which shows significant intraventricular extension of a small periventricular hemorrhage.  If patient transitions to a [facility] affiliated facility [facility] can follow up for needs. Patient's nephew is noted as HCPOA.    Of note, [facility] Care Management services does not replace or interfere with any services that are arranged by inpatient [facility] care management team.   For additional questions or referrals please contact:  Natividad Brood, RN BSN [facility] Liaison  [phone] business mobile phone Toll free office [phone]  Fax number: [phone] [email or web address]

## 2019-03-24 NOTE — Progress Notes (Signed)
STROKE TEAM PROGRESS NOTE   INTERVAL HISTORY Pt lying in bed, neuro stable. Able to open eyes on voice, only orientated to self and place. Dysarthria. Afebrile this am. BP better controlled. PT/OT recommend SNF.      Vitals:   03/23/19 2311 03/24/19 0312 03/24/19 0532 03/24/19 0751  BP: (!) 173/67 (!) 179/65 (!) 147/61 (!) 149/62  Pulse: 65 65 69 74  Resp: 17 14  18   Temp: 97.6 F (36.4 C) 97.7 F (36.5 C)  98.1 F (36.7 C)  TempSrc: Oral Oral  Oral  SpO2: 100% 99%  99%  Weight:      Height:        CBC:  Recent Labs  Lab 03/17/19 1811  03/23/19 0117 03/24/19 0512  WBC 8.4   < > 6.0 5.3  NEUTROABS 7.3  --   --   --   HGB 13.3   < > 9.7* 10.5*  HCT 40.0   < > 27.9* 30.9*  MCV 98.5   < > 97.2 98.4  PLT 122*   < > 90* 80*   < > = values in this interval not displayed.    Basic Metabolic Panel:  Recent Labs  Lab 03/17/19 1922  03/23/19 0117 03/24/19 0512  NA  --    < > 139 133*  K  --    < > 3.6 4.2  CL  --    < > 107 101  CO2  --    < > 24 20*  GLUCOSE  --    < > 123* 118*  BUN  --    < > 31* 38*  CREATININE  --    < > 1.03* 0.99  CALCIUM  --    < > 7.6* 7.9*  MG 1.7  --   --   --    < > = values in this interval not displayed.   Lipid Panel:     Component Value Date/Time   CHOL 185 03/19/2019 0621   CHOL 140 12/31/2016 1409   TRIG 146 03/19/2019 0621   HDL 43 03/19/2019 0621   HDL 61 12/31/2016 1409   CHOLHDL 4.3 03/19/2019 0621   VLDL 29 03/19/2019 0621   LDLCALC 113 (H) 03/19/2019 0621   LDLCALC 68 12/31/2016 1409   HgbA1c:  Lab Results  Component Value Date   HGBA1C 5.2 03/23/2019   Urine Drug Screen: No results found for: LABOPIA, COCAINSCRNUR, LABBENZ, AMPHETMU, THCU, LABBARB  Alcohol Level     Component Value Date/Time   ETH <10 03/17/2019 1925    IMAGING  Ct Head Wo Contrast 03/17/2019   1. Large intraventricular hemorrhage with ventricular dilatation. 2. 9 millimeter intraparenchymal hemorrhage within the frontal lobe  periventricular white matter, adjacent to the body of the LEFT LATERAL ventricle. 3. Atrophy and small vessel disease. 4. Significant scalp edema and hematoma along the vertex, not associated with underlying fracture.   CT Head WO Contrast 03/21/19 IMPRESSION: 1. Unchanged appearance of large volume intraventricular and small volume extra-axial subarachnoid hemorrhage. No new hemorrhagic site. 2. Slight decrease in size of the lateral ventricles compared to the prior study. 3. Large scalp vertex hematoma.  Ct Cervical Spine Wo Contrast 03/17/2019   Significant stable degenerative changes throughout the cervical spine. No evidence for acute abnormality.   Ct Angio Head W Or Wo Contrast Ct Angio Neck W Or Wo Contrast 03/18/2019 No evidence of underlying vascular lesion in the region of the deep brain hemorrhage on the left. Advanced  aortic atherosclerosis. Atherosclerotic disease at both carotid bifurcations with maximal stenosis of 60% on each side. 30-50% stenosis at both vertebral artery origins. 50% or less stenosis in both carotid siphon regions. 70% or greater stenosis of the right vertebral artery V4 segment. 50-70% stenosis of the left vertebral artery V4 segment.   Mr Brain Wo Contrast 03/19/2019 1. Stable large volume of intraventricular hemorrhage, although superimposed subarachnoid hemorrhage volume appears increased from the CT on 03/17/2019 suggesting a degree of continued bleeding.  2. Stable ventriculomegaly, with mild transependymal edema. No significant midline shift. Basilar cisterns remain patent.  3. As before, the hemorrhage might have originated in the left periventricular white matter. No superimposed acute infarct. No underlying lesion is identified in the absence of IV contrast.  4. Scalp hematoma at the vertex re-identified.    2D Echocardiogram  1. Left ventricular ejection fraction, by visual estimation, is 60 to 65%. The left ventricle has normal function. Normal  left ventricular size. Left ventricular septal wall thickness was mildly increased. Mildly increased left ventricular posterior wall thickness. There is mildly increased left ventricular hypertrophy.  2. Elevated left ventricular end-diastolic pressure.  3. Left ventricular diastolic Doppler parameters are consistent with pseudonormalization pattern of LV diastolic filling.  4. Global right ventricle has normal systolic function.The right ventricular size is normal. No increase in right ventricular wall thickness.  5. Left atrial size was normal.  6. Right atrial size was normal.  7. The mitral valve is normal in structure. Trace mitral valve regurgitation. No evidence of mitral stenosis.  8. The tricuspid valve is normal in structure. Tricuspid valve regurgitation is trivial.  9. The aortic valve The aortic valve is normal in structure. Aortic valve regurgitation is mild by color flow Doppler. Structurally normal aortic valve, with no evidence of sclerosis or stenosis. 10. The pulmonic valve was normal in structure. Pulmonic valve regurgitation is not visualized by color flow Doppler. 11. Mildly elevated pulmonary artery systolic pressure. 12. The inferior vena cava is normal in size with greater than 50% respiratory variability, suggesting right atrial pressure of 3 mmHg.  Dg Chest Port 1 View 03/17/2019 Cardiomegaly with mild interstitial edema.   DG Chest - Portable 1 View 03/20/2019 1. Largely resolved vascular congestion and interstitial edema.   PHYSICAL EXAM     Temp:  [97.6 F (36.4 C)-100.5 F (38.1 C)] 98.1 F (36.7 C) (09/23 0751) Pulse Rate:  [65-84] 74 (09/23 0751) Resp:  [14-32] 18 (09/23 0751) BP: (110-179)/(43-95) 149/62 (09/23 0751) SpO2:  [97 %-100 %] 99 % (09/23 0751)  General - Well nourished, well developed, lethargic, more awake and interactive.  Ophthalmologic - fundi not visualized due to noncooperation.  Cardiovascular - Regular rate and  rhythm.  Neuro - lethargic, more awake alert and interactive than yesterday, mild eye opening apraxia. Only orientated to self and place, not to age or time. No gaze preference, attending to both sides, but seems to have left INO. B/l lower eyelid ectropion. Blinking to visual threat bilaterally. Left mild facial droop. Tongue midline. LUE 3+/5 and RUE 3/5. However, BLE 0/5 proximal and 2+/5 withdraw on pain on the left and right. BLE also increase muscle tone. Sensation, coordination not cooperative and gait not tested.   ASSESSMENT/PLAN Jane [name] is a 85 y.o. female with history of HTN, HLD, previous stroke, CAD, CHF, CKD presenting after being found down with severe HTN, L gaze deviation, not following commands. CT shows hemorrhage.   ICH: L frontal small periventricular hemorrhage with  extensive IVH likely secondary to uncontrolled hypertension  CT C-Spine degenerative changes, no acute abnormality  CT head large IVH w/ ventricular dilatation. 9 mm L frontal lobe. Sign scalp edema and hematoma along vertex, no fx.  CTA head & neck no underlying vascular lesion for L hemorrhage. Atherosclerosis B ICA bifurcations mas 60%, 70% or more R V4, 50-70% L V4.  MRI  Stable IVH, increased SAH. Hemorrhage originiated from L periventricular white matter. Stable ventriculomegaly, no midline shift. No infarct. Scalp hematoma  CT head 03/21/19 stable IVH and SAH  2D Echo EF 60-65%. No source of embolus   LDL 113  HgbA1c 5.2  Heparin subq for VTE prophylaxis  aspirin 81 mg daily prior to admission, now on No antithrombotic given hemorrhage   Therapy recommendations: SNF  Disposition:  Pending  Transferred to [facility] for ongoing care  Hypertensive Emergency  Home meds:  Lasix 20, isosorbide 30, losartan 100, zebeta 5mg   BP 234/127 on arrival  SBP goal < 160   Off Cleviprex now  On prn labetalol  Resume home meds and increase zebeta to 10mg  bid  Add amlodipine  10mg   . Long-term BP goal normotensive  CHF with pulmonary edema  CXR Cardiomegaly with mild interstitial edema  Resume lasix  On IVF at 16  CXR repeat Largely resolved vascular congestion and interstitial edema  Repeat CXR mild cardiomegaly, vascular congestioin  Hyperlipidemia  Home meds:  lipitor 40  LDL 113  Hold lipitor given ICH  Consider continuation of statin at discharge  Dysphagia . Secondary to stroke . dys1 and thin liquid during the day . Poor po intake . On TF @ 75 at night and free water 200 Q6 . Has Cortrak . Speech on board  E.Coli bacteremia with fever  Tmax 101.8  Blood culture 1/2 E. Coli - sensitive to rocephin  IV Rocephin started 03/20/19 - 7 day course  U/A neg  WBC - 9.0-> 4.7->6.8->6.0->5.3  Curbside with CCM, procalcitonin neg, LA 2.3, and repeat BCx NGTD    Rocephin changed to Ancef 9/22>>9/26  Repeat UA 0-5 WBC  AKI on CKD stage III  Cre 1.18->1.05->1.18->0.97->1.03->0.99   Na 143->146->142->139->133  Continue IVF and TF  On free water 200 Q6  Close monitoring  Thrombocytopenia   Platelet 122->93->80->73->60->90->80    CBC monitoring  Other Stroke Risk Factors  Advanced age  Former Cigarette smoker  Hx stroke/TIA - Documented hx stroke but not details in Epic (occurred prior to 2014)  Coronary artery disease  Other Active Problems  Found down. Collar placed. CT CS cleared bones. Discussed with trauma - literature now ok to remove collar with neg films. Collar d/c'ed.   Hypokalemia 3.2, replace - 3.8->3.5->3.6->3.8->3.6->4.2   Hx temporal arteritis 11/2012  Hospital day # 7    Jane Hawking, MD PhD Stroke Neurology 03/24/2019 11:44 AM   To contact Stroke Continuity provider, please refer to [email or web address] After hours, contact General Neurology

## 2019-03-25 DIAGNOSIS — N390 Urinary tract infection, site not specified: Secondary | ICD-10-CM

## 2019-03-25 LAB — GLUCOSE, CAPILLARY
Glucose-Capillary: 100 mg/dL — ABNORMAL HIGH (ref 70–99)
Glucose-Capillary: 105 mg/dL — ABNORMAL HIGH (ref 70–99)
Glucose-Capillary: 106 mg/dL — ABNORMAL HIGH (ref 70–99)
Glucose-Capillary: 116 mg/dL — ABNORMAL HIGH (ref 70–99)
Glucose-Capillary: 118 mg/dL — ABNORMAL HIGH (ref 70–99)
Glucose-Capillary: 131 mg/dL — ABNORMAL HIGH (ref 70–99)
Glucose-Capillary: 135 mg/dL — ABNORMAL HIGH (ref 70–99)

## 2019-03-25 LAB — CBC
HCT: 30.1 % — ABNORMAL LOW (ref 36.0–46.0)
Hemoglobin: 10.1 g/dL — ABNORMAL LOW (ref 12.0–15.0)
MCH: 32.7 pg (ref 26.0–34.0)
MCHC: 33.6 g/dL (ref 30.0–36.0)
MCV: 97.4 fL (ref 80.0–100.0)
Platelets: 121 10*3/uL — ABNORMAL LOW (ref 150–400)
RBC: 3.09 MIL/uL — ABNORMAL LOW (ref 3.87–5.11)
RDW: 13.6 % (ref 11.5–15.5)
WBC: 6.1 10*3/uL (ref 4.0–10.5)
nRBC: 0 % (ref 0.0–0.2)

## 2019-03-25 LAB — BASIC METABOLIC PANEL
Anion gap: 11 (ref 5–15)
BUN: 42 mg/dL — ABNORMAL HIGH (ref 8–23)
CO2: 24 mmol/L (ref 22–32)
Calcium: 8.1 mg/dL — ABNORMAL LOW (ref 8.9–10.3)
Chloride: 97 mmol/L — ABNORMAL LOW (ref 98–111)
Creatinine, Ser: 0.99 mg/dL (ref 0.44–1.00)
GFR calc Af Amer: 59 mL/min — ABNORMAL LOW (ref >60)
GFR calc non Af Amer: 51 mL/min — ABNORMAL LOW (ref >60)
Glucose, Bld: 131 mg/dL — ABNORMAL HIGH (ref 70–99)
Potassium: 4.1 mmol/L (ref 3.5–5.1)
Sodium: 132 mmol/L — ABNORMAL LOW (ref 135–145)

## 2019-03-25 LAB — HEMOGLOBIN A1C
Hgb A1c MFr Bld: 5.1 % (ref 4.8–5.6)
Mean Plasma Glucose: 100 mg/dL

## 2019-03-25 MED ORDER — ISOSORBIDE MONONITRATE ER 60 MG PO TB24
60.0000 mg | ORAL_TABLET | Freq: Every day | ORAL | Status: DC
  Administered 2019-03-26 – 2019-03-27 (×2): 60 mg via ORAL
  Filled 2019-03-25 (×2): qty 1

## 2019-03-25 MED ORDER — ATORVASTATIN CALCIUM 40 MG PO TABS
40.0000 mg | ORAL_TABLET | Freq: Every day | ORAL | Status: DC
  Administered 2019-03-25 – 2019-03-26 (×2): 40 mg via ORAL
  Filled 2019-03-25 (×3): qty 1

## 2019-03-25 NOTE — Progress Notes (Signed)
Core-track D/C'd, patient tolerated well.

## 2019-03-25 NOTE — Progress Notes (Signed)
cortrak d/c per MD order, patient tolerated well.  Cecilie Heidel, Tivis Ringer, RN

## 2019-03-25 NOTE — Progress Notes (Signed)
STROKE TEAM PROGRESS NOTE   INTERVAL HISTORY Patient sitting in bed, slightly more awake alert and interactive than yesterday.  Still has core track for tube feeding at night, but on dysphagia 1 diet with thin liquid during the day.  Plan to remove core track.  Pending SNF.  Vitals:   03/25/19 0025 03/25/19 0338 03/25/19 0749 03/25/19 1225  BP: (!) 158/85 (!) 155/74 (!) 166/80 (!) 163/80  Pulse: 71 66 75 67  Resp: 19 18 18 18   Temp: 98.6 F (37 C) 98.4 F (36.9 C) 99 F (37.2 C) 98.7 F (37.1 C)  TempSrc: Axillary Oral Oral Axillary  SpO2: 99% 96% 100% 96%  Weight:      Height:        CBC:  Recent Labs  Lab 03/24/19 0512 03/25/19 0346  WBC 5.3 6.1  HGB 10.5* 10.1*  HCT 30.9* 30.1*  MCV 98.4 97.4  PLT 80* 121*    Basic Metabolic Panel:  Recent Labs  Lab 03/24/19 0512 03/25/19 0346  NA 133* 132*  K 4.2 4.1  CL 101 97*  CO2 20* 24  GLUCOSE 118* 131*  BUN 38* 42*  CREATININE 0.99 0.99  CALCIUM 7.9* 8.1*   Lipid Panel:     Component Value Date/Time   CHOL 185 03/19/2019 0621   CHOL 140 12/31/2016 1409   TRIG 146 03/19/2019 0621   HDL 43 03/19/2019 0621   HDL 61 12/31/2016 1409   CHOLHDL 4.3 03/19/2019 0621   VLDL 29 03/19/2019 0621   LDLCALC 113 (H) 03/19/2019 0621   LDLCALC 68 12/31/2016 1409   HgbA1c:  Lab Results  Component Value Date   HGBA1C 5.1 03/24/2019   Urine Drug Screen: No results found for: LABOPIA, COCAINSCRNUR, LABBENZ, AMPHETMU, THCU, LABBARB  Alcohol Level     Component Value Date/Time   ETH <10 03/17/2019 1925    IMAGING Ct Head Wo Contrast 03/17/2019   1. Large intraventricular hemorrhage with ventricular dilatation. 2. 9 millimeter intraparenchymal hemorrhage within the frontal lobe periventricular white matter, adjacent to the body of the LEFT LATERAL ventricle. 3. Atrophy and small vessel disease. 4. Significant scalp edema and hematoma along the vertex, not associated with underlying fracture.   CT Head WO  Contrast 03/21/19 IMPRESSION: 1. Unchanged appearance of large volume intraventricular and small volume extra-axial subarachnoid hemorrhage. No new hemorrhagic site. 2. Slight decrease in size of the lateral ventricles compared to the prior study. 3. Large scalp vertex hematoma.  Ct Cervical Spine Wo Contrast 03/17/2019   Significant stable degenerative changes throughout the cervical spine. No evidence for acute abnormality.   Ct Angio Head W Or Wo Contrast Ct Angio Neck W Or Wo Contrast 03/18/2019 No evidence of underlying vascular lesion in the region of the deep brain hemorrhage on the left. Advanced aortic atherosclerosis. Atherosclerotic disease at both carotid bifurcations with maximal stenosis of 60% on each side. 30-50% stenosis at both vertebral artery origins. 50% or less stenosis in both carotid siphon regions. 70% or greater stenosis of the right vertebral artery V4 segment. 50-70% stenosis of the left vertebral artery V4 segment.   Mr Brain Wo Contrast 03/19/2019 1. Stable large volume of intraventricular hemorrhage, although superimposed subarachnoid hemorrhage volume appears increased from the CT on 03/17/2019 suggesting a degree of continued bleeding.  2. Stable ventriculomegaly, with mild transependymal edema. No significant midline shift. Basilar cisterns remain patent.  3. As before, the hemorrhage might have originated in the left periventricular white matter. No superimposed acute infarct. No underlying  lesion is identified in the absence of IV contrast.  4. Scalp hematoma at the vertex re-identified.    2D Echocardiogram  1. Left ventricular ejection fraction, by visual estimation, is 60 to 65%. The left ventricle has normal function. Normal left ventricular size. Left ventricular septal wall thickness was mildly increased. Mildly increased left ventricular posterior wall thickness. There is mildly increased left ventricular hypertrophy.  2. Elevated left ventricular  end-diastolic pressure.  3. Left ventricular diastolic Doppler parameters are consistent with pseudonormalization pattern of LV diastolic filling.  4. Global right ventricle has normal systolic function.The right ventricular size is normal. No increase in right ventricular wall thickness.  5. Left atrial size was normal.  6. Right atrial size was normal.  7. The mitral valve is normal in structure. Trace mitral valve regurgitation. No evidence of mitral stenosis.  8. The tricuspid valve is normal in structure. Tricuspid valve regurgitation is trivial.  9. The aortic valve The aortic valve is normal in structure. Aortic valve regurgitation is mild by color flow Doppler. Structurally normal aortic valve, with no evidence of sclerosis or stenosis. 10. The pulmonic valve was normal in structure. Pulmonic valve regurgitation is not visualized by color flow Doppler. 11. Mildly elevated pulmonary artery systolic pressure. 12. The inferior vena cava is normal in size with greater than 50% respiratory variability, suggesting right atrial pressure of 3 mmHg.  Dg Chest Port 1 View 03/17/2019 Cardiomegaly with mild interstitial edema.   DG Chest - Portable 1 View 03/20/2019 1. Largely resolved vascular congestion and interstitial edema.   PHYSICAL EXAM    Temp:  [98.4 F (36.9 C)-99 F (37.2 C)] 98.7 F (37.1 C) (09/24 1225) Pulse Rate:  [66-79] 67 (09/24 1225) Resp:  [18-19] 18 (09/24 1225) BP: (118-166)/(64-85) 163/80 (09/24 1225) SpO2:  [96 %-100 %] 96 % (09/24 1225)  General - Well nourished, well developed, not in acute distress.  Ophthalmologic - fundi not visualized due to noncooperation.  Cardiovascular - Regular rate and rhythm.  Neuro - mild lethargic, more awake alert and interactive than yesterday, mild eye opening apraxia. Only orientated to self and place, not to age or time. No gaze preference, attending to both sides, but seems to have left INO. B/l lower eyelid ectropion.  Blinking to visual threat bilaterally. Left mild facial droop. Tongue midline. LUE 3+/5 and RUE 3/5. However, BLE 0/5 proximal and 2+/5 withdraw on pain on the left and right. BLE also increase muscle tone. Sensation, coordination not cooperative and gait not tested.   ASSESSMENT/PLAN [name] is a 85 y.o. female with history of HTN, HLD, previous stroke, CAD, CHF, CKD presenting after being found down with severe HTN, L gaze deviation, not following commands. CT shows hemorrhage.   ICH: L frontal small periventricular hemorrhage with extensive IVH likely secondary to uncontrolled hypertension  CT C-Spine degenerative changes, no acute abnormality  CT head large IVH w/ ventricular dilatation. 9 mm L frontal lobe. Sign scalp edema and hematoma along vertex, no fx.  CTA head & neck no underlying vascular lesion for L hemorrhage. Atherosclerosis B ICA bifurcations mas 60%, 70% or more R V4, 50-70% L V4.  MRI  Stable IVH, increased SAH. Hemorrhage originiated from L periventricular white matter. Stable ventriculomegaly, no midline shift. No infarct. Scalp hematoma  CT head 03/21/19 stable IVH and SAH  2D Echo EF 60-65%. No source of embolus   LDL 113  HgbA1c 5.2  Heparin subq for VTE prophylaxis  aspirin 81 mg daily prior  to admission, now on No antithrombotic given hemorrhage   Therapy recommendations: SNF  Disposition:  Pending  coronovirus testing pending for SNF placement  Follow up neuro in 4 weeks. Order placed  Hypertensive Emergency  Home meds:  Lasix 20, isosorbide 30, losartan 100, zebeta 5mg   BP 234/127 on arrival  SBP goal < 160   Off Cleviprex now  On prn labetalol  Resume home meds and increase zebeta to 10mg  bid  Add amlodipine 10mg  9/23 . Long-term BP goal normotensive  Hyperlipidemia  Home meds:  lipitor 40  LDL 113  Resume lipitor   Continue statin at discharge  Dysphagia . Secondary to stroke . TF at night . dys1 and thin  liquid during the day . Plan to D/c Cortrak . Speech on board  E.Coli bacteremia with fever  Tmax 101.8  Blood culture 1/2 E. Coli - sensitive to rocephin  IV Rocephin started 03/20/19 - 7 day course  U/A neg  WBC - 9.0-> 4.7->6.8->6.0->5.3-6.1  Curbside with CCM, procalcitonin neg, LA 2.3, and repeat BCx NGTD    Rocephin changed to Ancef 9/22>>9/26  Repeat UA 0-5 WBC  AKI on CKD stage III  Cre 1.18->1.05->1.18->0.97->1.03->0.99-0.99   Na 143->146->142->139->133-132  Plan to DC tube feeding  Discontinue free water  Close monitoring  Thrombocytopenia   Platelet 122->93->80->73->60->90->80-121    CBC monitoring  Other Stroke Risk Factors  Advanced age  Former Cigarette smoker  Hx stroke/TIA - Documented hx stroke but not details in Epic (occurred prior to 2014)  Coronary artery disease  Other Active Problems  Found down. Collar placed. CT CS cleared bones. Discussed with trauma - literature now ok to remove collar with neg films. Collar d/c'ed.   Hypokalemia 3.2, replace - 3.8->3.5->3.6->3.8->3.6->4.2-4.1  Hx temporal arteritis 11/2012  Hospital day # 8   Neurology will sign off. Please call with questions. Pt will follow up with stroke clinic NP at [facility] in about 4 weeks. Thanks for the consult.   Rosalin Hawking, MD PhD Stroke Neurology 03/25/2019 2:36 PM   To contact Stroke Continuity provider, please refer to [email or web address] After hours, contact General Neurology

## 2019-03-25 NOTE — Progress Notes (Signed)
  Speech Language Pathology Treatment: Dysphagia;Cognitive-Linquistic  Patient Details Name: Jane [name] MRN: [id number] DOB: 1928/11/07 Today's Date: 03/25/2019 Time: [id number] SLP Time Calculation (min) (ACUTE ONLY): 20 min  Assessment / Plan / Recommendation Clinical Impression  Pt was quick to rouse and cooperative with language deficits regarding word-finding (aware of her deficits). She was able to verbalize wants/needs. Oral cavity examination revealed dorsal tongue texture with brown discoloration. Decreased respiratory support more notable this session, requiring longer breaks between swallows. Audible swallows with thins followed by eructation, consistent with previously diagnosed esophageal dysfunction. She initially coughed with the first presentation of thins via straw, but did not produce any s/sx of aspiration in subsequent trials. Given D2 consistencies, she had generalized decreased lingual sensation, manipulation, and awareness of bolus. Following with a puree presentation was successful in clearing D2 residue. Pt is not ready for diet advancement to D2 at this time, recommend D1 with thins, meds crushed in puree. Due to her toleration of puree and thins, she is a candidate for NG tube removal for SNF placement. Intake last few days is unknown, however RN reported consumption has been low. Recommend strict documentation of PO intake and possible input from dietitian to ensure adequate nutritional and caloric intake.   HPI HPI: Jane [name] is a 85 y.o. female with a past medical history of stroke, GERD, pna, ectropion of left lower eyelid, hypertension, hyperlipidemia, CHF presents today after being found on the ground by maintenance. MRI A small volume of superimposed subarachnoid hemorrhage layering posteriorly has increased especially in the posterior fossa along the cerebellar convexities. The hemorrhage might have originated in the left periventricular white matter.       SLP Plan  Continue with current plan of care       Recommendations  Diet recommendations: Dysphagia 1 (puree);Thin liquid Liquids provided via: Straw;Cup Medication Administration: Crushed with puree Supervision: Staff to assist with self feeding Compensations: Slow rate;Small sips/bites Postural Changes and/or Swallow Maneuvers: Seated upright 90 degrees                Oral Care Recommendations: Oral care QID Follow up Recommendations: Skilled Nursing facility SLP Visit Diagnosis: Dysphagia, unspecified (R13.10);Cognitive communication deficit (R41.841) Plan: Continue with current plan of care                       Annalicia Renfrew 03/25/2019, 9:30 AM

## 2019-03-25 NOTE — Progress Notes (Signed)
PROGRESS NOTE    Jane [name]  [id number] DOB: July 20, 1928 DOA: 03/17/2019 PCP: Ina Homes, MD    Brief Narrative:   85 year old lady with prior h./o hypertension, Hyperlipidemia, CAD, diastolic heart failure, stage 3 CKD, presents with intracranial hemorrhage.    Assessment & Plan:   Active Problems:   Stroke (cerebrum) (HCC)   Pressure injury of skin   IVH (intraventricular hemorrhage) (HCC)   Cytotoxic brain edema (HCC)   Left frontal periventricular hemorrhage secondary to uncontrolled hypertension;   Reviewed CT head and CTA Of the head and neck .  MRI of the brain showed stable IVH, increased SAD, no midline shift, no infarct.   2 D echocardiogram showed LVEF of 60% to 65%. No source of embolus.   A!c is 5.2, LDL is 113.  Therapy eval recommending SNF.   Appreciate neurology and neurosurgery recommendations.  No anticoagulation secondary to bleed.    Hypertensive Emergency:  BP parameters are elevated today around 160/60's. SBP goal less than 160. Increase imdur to 60 mg daily.  Continue amlodipine, bisoprolol, lasix and cozaar.     Pulmonary edema:  Resume lasix 20 mg daily.  Repeat CXR in am for evaluation of resolution of pulm edema.    Hyperlipidemia:  Resume lipitor.    Dysphagia secondary to stroke.  Continue with dysphagia 1 diet.  D/c cortrak and encourage oral intake.    Acute on STAGE 3 CKD:  Creatinine back to baseline.  No new complaints.    Thrombocytopenia:  Improved after stopping heparin inj .  Continue to monitor.    E coli bacteremia:  Blood cultures from 9/16 showing E coli , repeat blood cultures negative.  procalcitonin Is negative.  Repeat blood cultures so far negative. Get urine cultures.  Pt has been on rocephin from 9/19 till 9/22 . Ancef from 9/22 till 9/26 for the duration of 7 days.     DVT prophylaxis: scds Code Status: full code.  Family Communication: none at bedside. Disposition Plan: SNF in  the next 24 to 48 hours.    Consults.   Neurology.   Neuro surgery.    Procedures: MRI brain  CTA of the head and neck.   Antimicrobials: ancef from 9/22 till 9/26  Subjective: Minimally conversing.  Doe snot appear to be in distress.  Able to say her name only at this time.   Objective: Vitals:   03/25/19 0338 03/25/19 0749 03/25/19 1225 03/25/19 1551  BP: (!) 155/74 (!) 166/80 (!) 163/80 (!) 161/66  Pulse: 66 75 67 69  Resp: 18 18 18 18   Temp: 98.4 F (36.9 C) 99 F (37.2 C) 98.7 F (37.1 C) 98.9 F (37.2 C)  TempSrc: Oral Oral Axillary Axillary  SpO2: 96% 100% 96% 100%  Weight:      Height:        Intake/Output Summary (Last 24 hours) at 03/25/2019 1700 Last data filed at 03/25/2019 1500 Gross per 24 hour  Intake 1002 ml  Output 700 ml  Net 302 ml   Filed Weights   03/21/19 0500  Weight: 65.3 kg    Examination:  General exam: Ill appearing elderly lady , not in any distress.  Respiratory system: air entry fair, no wheezing or rhonchi.  Cardiovascular system: S1S2, heard, regular rate rhythm Gastrointestinal system: Abdomen is soft, nontender, nondistended, bowel sounds are good Central nervous system: Alert and is able to only say her name at this time. Extremities: No pedal edema Skin: No ulcers Psychiatry: Patient  is confused    Data Reviewed: I have personally reviewed following labs and imaging studies  CBC: Recent Labs  Lab 03/21/19 0332 03/22/19 0602 03/23/19 0117 03/24/19 0512 03/25/19 0346  WBC 4.7 6.8 6.0 5.3 6.1  HGB 10.0* 10.7* 9.7* 10.5* 10.1*  HCT 29.7* 31.8* 27.9* 30.9* 30.1*  MCV 99.0 97.8 97.2 98.4 97.4  PLT 73* 60* 90* 80* 123XX123*   Basic Metabolic Panel: Recent Labs  Lab 03/21/19 0332 03/22/19 0602 03/23/19 0117 03/24/19 0512 03/25/19 0346  NA 146* 142 139 133* 132*  K 3.6 3.8 3.6 4.2 4.1  CL 116* 108 107 101 97*  CO2 20* 23 24 20* 24  GLUCOSE 105* 110* 123* 118* 131*  BUN 33* 26* 31* 38* 42*  CREATININE  1.18* 0.97 1.03* 0.99 0.99  CALCIUM 8.2* 8.0* 7.6* 7.9* 8.1*   GFR: Estimated Creatinine Clearance: 33.1 mL/min (by C-G formula based on SCr of 0.99 mg/dL). Liver Function Tests: No results for input(s): AST, ALT, ALKPHOS, BILITOT, PROT, ALBUMIN in the last 168 hours. No results for input(s): LIPASE, AMYLASE in the last 168 hours. No results for input(s): AMMONIA in the last 168 hours. Coagulation Profile: No results for input(s): INR, PROTIME in the last 168 hours. Cardiac Enzymes: No results for input(s): CKTOTAL, CKMB, CKMBINDEX, TROPONINI in the last 168 hours. BNP (last 3 results) No results for input(s): PROBNP in the last 8760 hours. HbA1C: Recent Labs    03/23/19 1129 03/24/19 0512  HGBA1C 5.2 5.1   CBG: Recent Labs  Lab 03/25/19 0023 03/25/19 0341 03/25/19 0748 03/25/19 1222 03/25/19 1550  GLUCAP 135* 131* 118* 106* 105*   Lipid Profile: No results for input(s): CHOL, HDL, LDLCALC, TRIG, CHOLHDL, LDLDIRECT in the last 72 hours. Thyroid Function Tests: No results for input(s): TSH, T4TOTAL, FREET4, T3FREE, THYROIDAB in the last 72 hours. Anemia Panel: No results for input(s): VITAMINB12, FOLATE, FERRITIN, TIBC, IRON, RETICCTPCT in the last 72 hours. Sepsis Labs: Recent Labs  Lab 03/21/19 0943 03/22/19 0602  PROCALCITON <0.10 <0.10  LATICACIDVEN 2.3*  --     Recent Results (from the past 240 hour(s))  Culture, blood (routine x 2)     Status: Abnormal   Collection Time: 03/17/19  4:10 PM   Specimen: BLOOD  Result Value Ref Range Status   Specimen Description BLOOD LEFT ANTECUBITAL  Final   Special Requests   Final    BOTTLES DRAWN AEROBIC AND ANAEROBIC Blood Culture results may not be optimal due to an inadequate volume of blood received in culture bottles   Culture  Setup Time   Final    GRAM POSITIVE COCCI IN BOTH AEROBIC AND ANAEROBIC BOTTLES CRITICAL RESULT CALLED TO, READ BACK BY AND VERIFIED WITH: PHARMD T BAUMEISTER [id number] AT 1330BY DV     Culture (A)  Final    STAPHYLOCOCCUS SPECIES (COAGULASE NEGATIVE) THE SIGNIFICANCE OF ISOLATING THIS ORGANISM FROM A SINGLE SET OF BLOOD CULTURES WHEN MULTIPLE SETS ARE DRAWN IS UNCERTAIN. PLEASE NOTIFY THE MICROBIOLOGY DEPARTMENT WITHIN ONE WEEK IF SPECIATION AND SENSITIVITIES ARE REQUIRED. ESCHERICHIA COLI CRITICAL RESULT CALLED TO, READ BACK BY AND VERIFIED WITH: Sharen Heck PHARMD, AT [id number] 03/20/19 REGARDING GRAM NEGATIVE ROD Performed at [facility], [place] [address] [place], [place] [address]    Report Status 03/21/2019 FINAL  Final   Organism ID, Bacteria ESCHERICHIA COLI  Final      Susceptibility   Escherichia coli - MIC*    AMPICILLIN >=32 RESISTANT Resistant     CEFAZOLIN <=4 SENSITIVE  Sensitive     CEFEPIME <=1 SENSITIVE Sensitive     CEFTAZIDIME <=1 SENSITIVE Sensitive     CEFTRIAXONE <=1 SENSITIVE Sensitive     CIPROFLOXACIN >=4 RESISTANT Resistant     GENTAMICIN >=16 RESISTANT Resistant     IMIPENEM <=0.25 SENSITIVE Sensitive     TRIMETH/SULFA >=320 RESISTANT Resistant     AMPICILLIN/SULBACTAM 16 INTERMEDIATE Intermediate     PIP/TAZO <=4 SENSITIVE Sensitive     Extended ESBL NEGATIVE Sensitive     * ESCHERICHIA COLI  Blood Culture ID Panel (Reflexed)     Status: Abnormal   Collection Time: 03/17/19  4:10 PM  Result Value Ref Range Status   Enterococcus species NOT DETECTED NOT DETECTED Final   Listeria monocytogenes NOT DETECTED NOT DETECTED Final   Staphylococcus species DETECTED (A) NOT DETECTED Final    Comment: Methicillin (oxacillin) resistant coagulase negative staphylococcus. Possible blood culture contaminant (unless isolated from more than one blood culture draw or clinical case suggests pathogenicity). No antibiotic treatment is indicated for blood  culture contaminants. CRITICAL RESULT CALLED TO, READ BACK BY AND VERIFIED WITH: PHARMD T BAUMEISTER [id number] AT 1330 BY DV    Staphylococcus aureus (BCID) NOT DETECTED NOT DETECTED Final   Methicillin  resistance DETECTED (A) NOT DETECTED Final    Comment: CRITICAL RESULT CALLED TO, READ BACK BY AND VERIFIED WITH: PHARMD T BAUMEISTER [id number] AT 1330 BY DV    Streptococcus species NOT DETECTED NOT DETECTED Final   Streptococcus agalactiae NOT DETECTED NOT DETECTED Final   Streptococcus pneumoniae NOT DETECTED NOT DETECTED Final   Streptococcus pyogenes NOT DETECTED NOT DETECTED Final   Acinetobacter baumannii NOT DETECTED NOT DETECTED Final   Enterobacteriaceae species NOT DETECTED NOT DETECTED Final   Enterobacter cloacae complex NOT DETECTED NOT DETECTED Final   Escherichia coli NOT DETECTED NOT DETECTED Final   Klebsiella oxytoca NOT DETECTED NOT DETECTED Final   Klebsiella pneumoniae NOT DETECTED NOT DETECTED Final   Proteus species NOT DETECTED NOT DETECTED Final   Serratia marcescens NOT DETECTED NOT DETECTED Final   Haemophilus influenzae NOT DETECTED NOT DETECTED Final   Neisseria meningitidis NOT DETECTED NOT DETECTED Final   Pseudomonas aeruginosa NOT DETECTED NOT DETECTED Final   Candida albicans NOT DETECTED NOT DETECTED Final   Candida glabrata NOT DETECTED NOT DETECTED Final   Candida krusei NOT DETECTED NOT DETECTED Final   Candida parapsilosis NOT DETECTED NOT DETECTED Final   Candida tropicalis NOT DETECTED NOT DETECTED Final    Comment: Performed at [facility], [address], [place], [place] [address]  Culture, blood (routine x 2)     Status: None   Collection Time: 03/17/19  7:15 PM   Specimen: BLOOD  Result Value Ref Range Status   Specimen Description BLOOD RIGHT ANTECUBITAL  Final   Special Requests   Final    BOTTLES DRAWN AEROBIC ONLY Blood Culture results may not be optimal due to an inadequate volume of blood received in culture bottles   Culture   Final    NO GROWTH 5 DAYS Performed at [facility], [place] [address] [place], [place] [address]    Report Status 03/22/2019 FINAL  Final  SARS Coronavirus 2 [facility] order, Performed in [facility] hospital lab) Nasopharyngeal Nasopharyngeal Swab     Status: None   Collection Time: 03/17/19  7:37 PM   Specimen: Nasopharyngeal Swab  Result Value Ref Range Status   SARS Coronavirus 2 NEGATIVE NEGATIVE Final    Comment: (NOTE) If  result is NEGATIVE SARS-CoV-2 target nucleic acids are NOT DETECTED. The SARS-CoV-2 RNA is generally detectable in upper and lower  respiratory specimens during the acute phase of infection. The lowest  concentration of SARS-CoV-2 viral copies this assay can detect is 250  copies / mL. A negative result does not preclude SARS-CoV-2 infection  and should not be used as the sole basis for treatment or other  patient management decisions.  A negative result may occur with  improper specimen collection / handling, submission of specimen other  than nasopharyngeal swab, presence of viral mutation(s) within the  areas targeted by this assay, and inadequate number of viral copies  (<250 copies / mL). A negative result must be combined with clinical  observations, patient history, and epidemiological information. If result is POSITIVE SARS-CoV-2 target nucleic acids are DETECTED. The SARS-CoV-2 RNA is generally detectable in upper and lower  respiratory specimens dur ing the acute phase of infection.  Positive  results are indicative of active infection with SARS-CoV-2.  Clinical  correlation with patient history and other diagnostic information is  necessary to determine patient infection status.  Positive results do  not rule out bacterial infection or co-infection with other viruses. If result is PRESUMPTIVE POSTIVE SARS-CoV-2 nucleic acids MAY BE PRESENT.   A presumptive positive result was obtained on the submitted specimen  and confirmed on repeat testing.  While 2019 novel coronavirus  (SARS-CoV-2) nucleic acids may be present in the submitted sample  additional confirmatory testing may be necessary for epidemiological  and / or clinical management  purposes  to differentiate between  SARS-CoV-2 and other Sarbecovirus currently known to infect humans.  If clinically indicated additional testing with an alternate test  methodology [phone]) is advised. The SARS-CoV-2 RNA is generally  detectable in upper and lower respiratory sp ecimens during the acute  phase of infection. The expected result is Negative. Fact Sheet for Patients:  StrictlyIdeas.no Fact Sheet for Healthcare Providers: BankingDealers.co.za This test is not yet approved or cleared by the [place] FDA and has been authorized for detection and/or diagnosis of SARS-CoV-2 by FDA under an Emergency Use Authorization (EUA).  This EUA will remain in effect (meaning this test can be used) for the duration of the COVID-19 declaration under Section 564(b)(1) of the Act, 21 U.S.C. section 360bbb-3(b)(1), unless the authorization is terminated or revoked sooner. Performed at [facility], [place] [address], [place], [place] [address]   MRSA PCR Screening     Status: None   Collection Time: 03/17/19 11:28 PM   Specimen: Nasopharyngeal  Result Value Ref Range Status   MRSA by PCR NEGATIVE NEGATIVE Final    Comment:        The GeneXpert MRSA Assay (FDA approved for NASAL specimens only), is one component of a comprehensive MRSA colonization surveillance program. It is not intended to diagnose MRSA infection nor to guide or monitor treatment for MRSA infections. Performed at [facility], [place] [address] [place], [place] [address]   Urine culture     Status: None   Collection Time: 03/19/19 12:19 PM   Specimen: Urine, Random  Result Value Ref Range Status   Specimen Description URINE, RANDOM  Final   Special Requests NONE  Final   Culture   Final    NO GROWTH Performed at [facility], [place] [address], [place], [place] [address]    Report Status 03/20/2019 FINAL  Final  Culture, blood (Routine X 2) w  Reflex  to ID Panel     Status: None (Preliminary result)   Collection Time: 03/21/19 10:01 AM   Specimen: BLOOD RIGHT WRIST  Result Value Ref Range Status   Specimen Description BLOOD RIGHT WRIST  Final   Special Requests   Final    BOTTLES DRAWN AEROBIC ONLY Blood Culture results may not be optimal due to an inadequate volume of blood received in culture bottles   Culture   Final    NO GROWTH 4 DAYS Performed at [facility], [place] [address] [place], [place] [address]    Report Status PENDING  Incomplete  Culture, blood (Routine X 2) w Reflex to ID Panel     Status: None (Preliminary result)   Collection Time: 03/21/19 10:01 AM   Specimen: BLOOD RIGHT FOREARM  Result Value Ref Range Status   Specimen Description BLOOD RIGHT FOREARM  Final   Special Requests   Final    BOTTLES DRAWN AEROBIC ONLY Blood Culture results may not be optimal due to an inadequate volume of blood received in culture bottles   Culture   Final    NO GROWTH 4 DAYS Performed at [facility], [place] [address] [place], [place] [address]    Report Status PENDING  Incomplete  Culture, blood (Routine X 2) w Reflex to ID Panel     Status: None (Preliminary result)   Collection Time: 03/24/19  8:52 AM   Specimen: BLOOD  Result Value Ref Range Status   Specimen Description BLOOD RIGHT ANTECUBITAL  Final   Special Requests   Final    BOTTLES DRAWN AEROBIC AND ANAEROBIC Blood Culture adequate volume   Culture   Final    NO GROWTH 1 DAY Performed at [facility], [place] [address] [place], [place] [address]    Report Status PENDING  Incomplete  Culture, blood (Routine X 2) w Reflex to ID Panel     Status: None (Preliminary result)   Collection Time: 03/24/19  8:56 AM   Specimen: BLOOD  Result Value Ref Range Status   Specimen Description BLOOD RIGHT ANTECUBITAL  Final   Special Requests   Final    BOTTLES DRAWN AEROBIC AND ANAEROBIC Blood Culture adequate volume   Culture   Final    NO GROWTH 1  DAY Performed at [facility], [place] [address] [place], [place] [address]    Report Status PENDING  Incomplete         Radiology Studies: No results found.      Scheduled Meds: . amLODipine  10 mg Oral Daily  . atorvastatin  40 mg Oral q1800  . bisoprolol  10 mg Per Tube BID  . chlorhexidine  15 mL Mouth Rinse BID  . Chlorhexidine Gluconate Cloth  6 each Topical Daily  . feeding supplement (ENSURE ENLIVE)  237 mL Oral BID BM  . feeding supplement (PRO-STAT SUGAR FREE 64)  30 mL Per Tube BID  . free water  200 mL Per Tube Q6H  . furosemide  20 mg Oral Daily  . hydroxypropyl methylcellulose / hypromellose  1 drop Both Eyes QID  . isosorbide mononitrate  30 mg Oral Daily  . losartan  50 mg Per Tube BID  . mouth rinse  15 mL Mouth Rinse q12n4p  . pantoprazole sodium  40 mg Per Tube Daily  . senna-docusate  1 tablet Per Tube BID   Continuous Infusions: . sodium chloride 10 mL/hr at 03/18/19 1423  .  ceFAZolin (ANCEF)  IV 2 g (03/25/19 1117)  . feeding supplement (JEVITY 1.2 CAL) Stopped (03/23/19 0636)     LOS: 8 days    Time spent: 29 minutes.     Hosie Poisson, MD [facility] Pager [phone]   If 7PM-7AM, please contact night-coverage [email or web address] Password [email or web address] 03/25/2019, 5:00 PM

## 2019-03-25 NOTE — Progress Notes (Signed)
covid swab done and took to lab patient tolerated well.

## 2019-03-25 NOTE — Progress Notes (Signed)
Physical Therapy Treatment Patient Details Name: Jane [name] MRN: [id number] DOB: 1929-06-03 Today's Date: 03/25/2019    History of Present Illness 85 y.o. female admitted on 03/18/19 for L frontal periventricular hemorrhage, L IVH secondary to uncontrolled HTN.  Pt with significant PMH of stroke, HTN, CKD, CHF, CAD, anemia.    PT Comments    Patient seen for mobility progression. Pt is oriented to self and able to state she is in the hospital however is lethargic and did not follow commands during session. Total A +2 for all bed mobility. PT will continue to follow and progress as tolerated.     Follow Up Recommendations  SNF     Equipment Recommendations  Wheelchair (measurements PT);Wheelchair cushion (measurements PT);Hospital bed;Other (comment)    Recommendations for Other Services       Precautions / Restrictions Precautions Precautions: Fall Restrictions Weight Bearing Restrictions: No    Mobility  Bed Mobility Overal bed mobility: Needs Assistance Bed Mobility: Rolling Rolling: Total assist;+2 for physical assistance         General bed mobility comments: total A for all bed mobility; pt had BM during session and cleaned up; bed in chair position and pt positioned in midline with heels floated end of session  Transfers                    Ambulation/Gait                 Stairs             Wheelchair Mobility    Modified Rankin (Stroke Patients Only) Modified Rankin (Stroke Patients Only) Pre-Morbid Rankin Score: No significant disability Modified Rankin: Severe disability     Balance Overall balance assessment: Needs assistance                                          Cognition Arousal/Alertness: Lethargic Behavior During Therapy: Flat affect Overall Cognitive Status: Impaired/Different from baseline Area of Impairment: Orientation;Attention;Memory;Following commands;Problem solving;Awareness                 Orientation Level: Disoriented to;Place;Time;Situation(able to state she is in the hospital) Current Attention Level: Focused Memory: Decreased recall of precautions;Decreased short-term memory Following Commands: (pt did not follow commands )   Awareness: Intellectual Problem Solving: Slow processing;Decreased initiation;Requires verbal cues;Requires tactile cues General Comments: pt opens eyes when speaking to her; not able to follow commands      Exercises      General Comments        Pertinent Vitals/Pain Pain Assessment: Faces Faces Pain Scale: Hurts little more Pain Location: not able to specify; grimacing with bed mobility Pain Descriptors / Indicators: Grimacing;Moaning Pain Intervention(s): Limited activity within patient's tolerance;Monitored during session;Repositioned    Home Living                      Prior Function            PT Goals (current goals can now be found in the care plan section) Progress towards PT goals: Not progressing toward goals - comment    Frequency    Min 3X/week      PT Plan Current plan remains appropriate    Co-evaluation              AM-PAC PT "6 Clicks" Mobility   Outcome Measure  Help  needed turning from your back to your side while in a flat bed without using bedrails?: Total Help needed moving from lying on your back to sitting on the side of a flat bed without using bedrails?: Total Help needed moving to and from a bed to a chair (including a wheelchair)?: Total Help needed standing up from a chair using your arms (e.g., wheelchair or bedside chair)?: Total Help needed to walk in hospital room?: Total Help needed climbing 3-5 steps with a railing? : Total 6 Click Score: 6    End of Session   Activity Tolerance: Patient limited by lethargy Patient left: in bed;with call bell/phone within reach;with bed alarm set;with SCD's reapplied(bed in chair position) Nurse Communication: Mobility  status PT Visit Diagnosis: Other abnormalities of gait and mobility (R26.89);Other symptoms and signs involving the nervous system (R29.898);Hemiplegia and hemiparesis Hemiplegia - Right/Left: Right Hemiplegia - dominant/non-dominant: Dominant Hemiplegia - caused by: Nontraumatic intracerebral hemorrhage     Time: [id number] PT Time Calculation (min) (ACUTE ONLY): 27 min  Charges:  $Therapeutic Activity: 23-37 mins                     Earney Navy, PTA Acute Rehabilitation Services Pager: [phone] Office: [phone]     Darliss Cheney 03/25/2019, 4:52 PM

## 2019-03-26 ENCOUNTER — Inpatient Hospital Stay (HOSPITAL_COMMUNITY): Payer: Medicare Other

## 2019-03-26 LAB — GLUCOSE, CAPILLARY
Glucose-Capillary: 102 mg/dL — ABNORMAL HIGH (ref 70–99)
Glucose-Capillary: 105 mg/dL — ABNORMAL HIGH (ref 70–99)
Glucose-Capillary: 105 mg/dL — ABNORMAL HIGH (ref 70–99)
Glucose-Capillary: 110 mg/dL — ABNORMAL HIGH (ref 70–99)
Glucose-Capillary: 87 mg/dL (ref 70–99)
Glucose-Capillary: 89 mg/dL (ref 70–99)
Glucose-Capillary: 96 mg/dL (ref 70–99)

## 2019-03-26 LAB — CULTURE, BLOOD (ROUTINE X 2)
Culture: NO GROWTH
Culture: NO GROWTH

## 2019-03-26 LAB — NOVEL CORONAVIRUS, NAA (HOSP ORDER, SEND-OUT TO REF LAB; TAT 18-24 HRS): SARS-CoV-2, NAA: NOT DETECTED

## 2019-03-26 NOTE — Progress Notes (Signed)
PROGRESS NOTE    Jane [name]  [id number] DOB: 06-22-1929 DOA: 03/17/2019 PCP: Ina Homes, MD    Brief Narrative:   85 year old lady with prior h./o hypertension, Hyperlipidemia, CAD, diastolic heart failure, stage 3 CKD, presents with intracranial hemorrhage.    Assessment & Plan:   Active Problems:   Stroke (cerebrum) (HCC)   Pressure injury of skin   IVH (intraventricular hemorrhage) (HCC)   Cytotoxic brain edema (HCC)   Left frontal periventricular hemorrhage secondary to uncontrolled hypertension;   Reviewed CT head and CTA Of the head and neck .  MRI of the brain showed stable IVH, increased SAD, no midline shift, no infarct.   2 D echocardiogram showed LVEF of 60% to 65%. No source of embolus.   A!c is 5.2, LDL is 113.  Therapy eval recommending SNF. Pt is medically stable to be discharged to SNF, waiting for a bed.   Appreciate neurology and neurosurgery recommendations.  No anticoagulation secondary to bleed.    Hypertensive Emergency:  One BP is 189/65 followed by normalization of bP parameters.   Increased imdur to 60 mg daily.  Continue amlodipine, bisoprolol, lasix and cozaar. No new changes.     Pulmonary edema:  Resume lasix 20 mg daily.  Repeat cxr pending.    Hyperlipidemia:  Resume lipitor.    Dysphagia secondary to stroke.  Continue with dysphagia 1 diet with thin liquids.  D/ced cortrak and encourage oral intake.    Acute on STAGE 3 CKD:  Creatinine back to baseline.  No new complaints.    Thrombocytopenia:  Improved after stopping heparin inj .  Continue to monitor.    E coli bacteremia:  Blood cultures from 9/16 showing E coli , repeat blood cultures negative.  procalcitonin Is negative.  Repeat blood cultures so far negative. Get urine cultures.  Pt has been on rocephin from 9/19 till 9/22 . Ancef from 9/22 till 9/26 for the duration of 7 days.  Stop antibiotics after tomorrow's dose.    Repeat CORONAVIRUS  testing is pending for SNF placement.    DVT prophylaxis: scds Code Status: full code.  Family Communication: none at bedside. Disposition Plan: SNF in the next 24 to 48 hours when bed available.    Consults.   Neurology.   Neuro surgery.    Procedures: MRI brain  CTA of the head and neck.   Antimicrobials: ancef from 9/22 till 9/26  Subjective:  Alert and following commands.   Objective: Vitals:   03/26/19 0811 03/26/19 1035 03/26/19 1305 03/26/19 1728  BP: (!) 189/65 (!) 180/60 138/68 124/78  Pulse: 65  73 76  Resp: (!) 28  14 (!) 24  Temp: 98.8 F (37.1 C)  98.9 F (37.2 C) 99.8 F (37.7 C)  TempSrc: Oral  Oral Oral  SpO2: 97%  97% 98%  Weight:      Height:        Intake/Output Summary (Last 24 hours) at 03/26/2019 1817 Last data filed at 03/26/2019 1734 Gross per 24 hour  Intake 0 ml  Output 1750 ml  Net -1750 ml   Filed Weights   03/21/19 0500 03/26/19 0500  Weight: 65.3 kg 73.6 kg    Examination:  General exam: ill appearing lady, not in distress.  Respiratory system:  Air entry fair, no wheezing or rhonchi.  Cardiovascular system: s1s2, RRR, no JVD.  Gastrointestinal system: ABD is soft , non tender non distended bowel sounds heard.  Central nervous system: alert and responding to  simple questions.  Extremities: no pedal edema or cyanosis.  Skin: no rashes seen.  Psychiatry: mood is appropriate.     Data Reviewed: I have personally reviewed following labs and imaging studies  CBC: Recent Labs  Lab 03/21/19 0332 03/22/19 0602 03/23/19 0117 03/24/19 0512 03/25/19 0346  WBC 4.7 6.8 6.0 5.3 6.1  HGB 10.0* 10.7* 9.7* 10.5* 10.1*  HCT 29.7* 31.8* 27.9* 30.9* 30.1*  MCV 99.0 97.8 97.2 98.4 97.4  PLT 73* 60* 90* 80* 123XX123*   Basic Metabolic Panel: Recent Labs  Lab 03/21/19 0332 03/22/19 0602 03/23/19 0117 03/24/19 0512 03/25/19 0346  NA 146* 142 139 133* 132*  K 3.6 3.8 3.6 4.2 4.1  CL 116* 108 107 101 97*  CO2 20* 23 24 20* 24   GLUCOSE 105* 110* 123* 118* 131*  BUN 33* 26* 31* 38* 42*  CREATININE 1.18* 0.97 1.03* 0.99 0.99  CALCIUM 8.2* 8.0* 7.6* 7.9* 8.1*   GFR: Estimated Creatinine Clearance: 35.2 mL/min (by C-G formula based on SCr of 0.99 mg/dL). Liver Function Tests: No results for input(s): AST, ALT, ALKPHOS, BILITOT, PROT, ALBUMIN in the last 168 hours. No results for input(s): LIPASE, AMYLASE in the last 168 hours. No results for input(s): AMMONIA in the last 168 hours. Coagulation Profile: No results for input(s): INR, PROTIME in the last 168 hours. Cardiac Enzymes: No results for input(s): CKTOTAL, CKMB, CKMBINDEX, TROPONINI in the last 168 hours. BNP (last 3 results) No results for input(s): PROBNP in the last 8760 hours. HbA1C: Recent Labs    03/24/19 0512  HGBA1C 5.1   CBG: Recent Labs  Lab 03/26/19 0012 03/26/19 0406 03/26/19 0806 03/26/19 1258 03/26/19 1731  GLUCAP 105* 105* 110* 102* 89   Lipid Profile: No results for input(s): CHOL, HDL, LDLCALC, TRIG, CHOLHDL, LDLDIRECT in the last 72 hours. Thyroid Function Tests: No results for input(s): TSH, T4TOTAL, FREET4, T3FREE, THYROIDAB in the last 72 hours. Anemia Panel: No results for input(s): VITAMINB12, FOLATE, FERRITIN, TIBC, IRON, RETICCTPCT in the last 72 hours. Sepsis Labs: Recent Labs  Lab 03/21/19 0943 03/22/19 0602  PROCALCITON <0.10 <0.10  LATICACIDVEN 2.3*  --     Recent Results (from the past 240 hour(s))  Culture, blood (routine x 2)     Status: Abnormal   Collection Time: 03/17/19  4:10 PM   Specimen: BLOOD  Result Value Ref Range Status   Specimen Description BLOOD LEFT ANTECUBITAL  Final   Special Requests   Final    BOTTLES DRAWN AEROBIC AND ANAEROBIC Blood Culture results may not be optimal due to an inadequate volume of blood received in culture bottles   Culture  Setup Time   Final    GRAM POSITIVE COCCI IN BOTH AEROBIC AND ANAEROBIC BOTTLES CRITICAL RESULT CALLED TO, READ BACK BY AND VERIFIED  WITH: PHARMD T BAUMEISTER [id number] AT 1330BY DV    Culture (A)  Final    STAPHYLOCOCCUS SPECIES (COAGULASE NEGATIVE) THE SIGNIFICANCE OF ISOLATING THIS ORGANISM FROM A SINGLE SET OF BLOOD CULTURES WHEN MULTIPLE SETS ARE DRAWN IS UNCERTAIN. PLEASE NOTIFY THE MICROBIOLOGY DEPARTMENT WITHIN ONE WEEK IF SPECIATION AND SENSITIVITIES ARE REQUIRED. ESCHERICHIA COLI CRITICAL RESULT CALLED TO, READ BACK BY AND VERIFIED WITH: Sharen Heck PHARMD, AT [id number] 03/20/19 REGARDING GRAM NEGATIVE ROD Performed at [facility], [place] [address], [place], [place] [address]    Report Status 03/21/2019 FINAL  Final   Organism ID, Bacteria ESCHERICHIA COLI  Final      Susceptibility   Escherichia coli -  MIC*    AMPICILLIN >=32 RESISTANT Resistant     CEFAZOLIN <=4 SENSITIVE Sensitive     CEFEPIME <=1 SENSITIVE Sensitive     CEFTAZIDIME <=1 SENSITIVE Sensitive     CEFTRIAXONE <=1 SENSITIVE Sensitive     CIPROFLOXACIN >=4 RESISTANT Resistant     GENTAMICIN >=16 RESISTANT Resistant     IMIPENEM <=0.25 SENSITIVE Sensitive     TRIMETH/SULFA >=320 RESISTANT Resistant     AMPICILLIN/SULBACTAM 16 INTERMEDIATE Intermediate     PIP/TAZO <=4 SENSITIVE Sensitive     Extended ESBL NEGATIVE Sensitive     * ESCHERICHIA COLI  Blood Culture ID Panel (Reflexed)     Status: Abnormal   Collection Time: 03/17/19  4:10 PM  Result Value Ref Range Status   Enterococcus species NOT DETECTED NOT DETECTED Final   Listeria monocytogenes NOT DETECTED NOT DETECTED Final   Staphylococcus species DETECTED (A) NOT DETECTED Final    Comment: Methicillin (oxacillin) resistant coagulase negative staphylococcus. Possible blood culture contaminant (unless isolated from more than one blood culture draw or clinical case suggests pathogenicity). No antibiotic treatment is indicated for blood  culture contaminants. CRITICAL RESULT CALLED TO, READ BACK BY AND VERIFIED WITH: PHARMD T BAUMEISTER [id number] AT 1330 BY DV    Staphylococcus aureus  (BCID) NOT DETECTED NOT DETECTED Final   Methicillin resistance DETECTED (A) NOT DETECTED Final    Comment: CRITICAL RESULT CALLED TO, READ BACK BY AND VERIFIED WITH: PHARMD T BAUMEISTER [id number] AT 1330 BY DV    Streptococcus species NOT DETECTED NOT DETECTED Final   Streptococcus agalactiae NOT DETECTED NOT DETECTED Final   Streptococcus pneumoniae NOT DETECTED NOT DETECTED Final   Streptococcus pyogenes NOT DETECTED NOT DETECTED Final   Acinetobacter baumannii NOT DETECTED NOT DETECTED Final   Enterobacteriaceae species NOT DETECTED NOT DETECTED Final   Enterobacter cloacae complex NOT DETECTED NOT DETECTED Final   Escherichia coli NOT DETECTED NOT DETECTED Final   Klebsiella oxytoca NOT DETECTED NOT DETECTED Final   Klebsiella pneumoniae NOT DETECTED NOT DETECTED Final   Proteus species NOT DETECTED NOT DETECTED Final   Serratia marcescens NOT DETECTED NOT DETECTED Final   Haemophilus influenzae NOT DETECTED NOT DETECTED Final   Neisseria meningitidis NOT DETECTED NOT DETECTED Final   Pseudomonas aeruginosa NOT DETECTED NOT DETECTED Final   Candida albicans NOT DETECTED NOT DETECTED Final   Candida glabrata NOT DETECTED NOT DETECTED Final   Candida krusei NOT DETECTED NOT DETECTED Final   Candida parapsilosis NOT DETECTED NOT DETECTED Final   Candida tropicalis NOT DETECTED NOT DETECTED Final    Comment: Performed at [facility], [address] [place], [place] [address]  Culture, blood (routine x 2)     Status: None   Collection Time: 03/17/19  7:15 PM   Specimen: BLOOD  Result Value Ref Range Status   Specimen Description BLOOD RIGHT ANTECUBITAL  Final   Special Requests   Final    BOTTLES DRAWN AEROBIC ONLY Blood Culture results may not be optimal due to an inadequate volume of blood received in culture bottles   Culture   Final    NO GROWTH 5 DAYS Performed at [facility], [place] [address] [place], [place] [address]    Report Status 03/22/2019 FINAL  Final   SARS Coronavirus 2 [facility] order, Performed in [facility] hospital lab) Nasopharyngeal Nasopharyngeal Swab     Status: None   Collection Time: 03/17/19  7:37 PM   Specimen: Nasopharyngeal Swab  Result Value Ref Range  Status   SARS Coronavirus 2 NEGATIVE NEGATIVE Final    Comment: (NOTE) If result is NEGATIVE SARS-CoV-2 target nucleic acids are NOT DETECTED. The SARS-CoV-2 RNA is generally detectable in upper and lower  respiratory specimens during the acute phase of infection. The lowest  concentration of SARS-CoV-2 viral copies this assay can detect is 250  copies / mL. A negative result does not preclude SARS-CoV-2 infection  and should not be used as the sole basis for treatment or other  patient management decisions.  A negative result may occur with  improper specimen collection / handling, submission of specimen other  than nasopharyngeal swab, presence of viral mutation(s) within the  areas targeted by this assay, and inadequate number of viral copies  (<250 copies / mL). A negative result must be combined with clinical  observations, patient history, and epidemiological information. If result is POSITIVE SARS-CoV-2 target nucleic acids are DETECTED. The SARS-CoV-2 RNA is generally detectable in upper and lower  respiratory specimens dur ing the acute phase of infection.  Positive  results are indicative of active infection with SARS-CoV-2.  Clinical  correlation with patient history and other diagnostic information is  necessary to determine patient infection status.  Positive results do  not rule out bacterial infection or co-infection with other viruses. If result is PRESUMPTIVE POSTIVE SARS-CoV-2 nucleic acids MAY BE PRESENT.   A presumptive positive result was obtained on the submitted specimen  and confirmed on repeat testing.  While 2019 novel coronavirus  (SARS-CoV-2) nucleic acids may be present in the submitted sample  additional confirmatory testing may be  necessary for epidemiological  and / or clinical management purposes  to differentiate between  SARS-CoV-2 and other Sarbecovirus currently known to infect humans.  If clinically indicated additional testing with an alternate test  methodology [phone]) is advised. The SARS-CoV-2 RNA is generally  detectable in upper and lower respiratory sp ecimens during the acute  phase of infection. The expected result is Negative. Fact Sheet for Patients:  StrictlyIdeas.no Fact Sheet for Healthcare Providers: BankingDealers.co.za This test is not yet approved or cleared by the [place] FDA and has been authorized for detection and/or diagnosis of SARS-CoV-2 by FDA under an Emergency Use Authorization (EUA).  This EUA will remain in effect (meaning this test can be used) for the duration of the COVID-19 declaration under Section 564(b)(1) of the Act, 21 U.S.C. section 360bbb-3(b)(1), unless the authorization is terminated or revoked sooner. Performed at [facility], [place] [address] [place], [place] [address]   MRSA PCR Screening     Status: None   Collection Time: 03/17/19 11:28 PM   Specimen: Nasopharyngeal  Result Value Ref Range Status   MRSA by PCR NEGATIVE NEGATIVE Final    Comment:        The GeneXpert MRSA Assay (FDA approved for NASAL specimens only), is one component of a comprehensive MRSA colonization surveillance program. It is not intended to diagnose MRSA infection nor to guide or monitor treatment for MRSA infections. Performed at [facility], [place] [name], [place], [place] [address]   Urine culture     Status: None   Collection Time: 03/19/19 12:19 PM   Specimen: Urine, Random  Result Value Ref Range Status   Specimen Description URINE, RANDOM  Final   Special Requests NONE  Final   Culture   Final    NO GROWTH Performed at [facility], [place] [address], [place],  [address]  Report  Status 03/20/2019 FINAL  Final  Culture, blood (Routine X 2) w Reflex to ID Panel     Status: None   Collection Time: 03/21/19 10:01 AM   Specimen: BLOOD RIGHT WRIST  Result Value Ref Range Status   Specimen Description BLOOD RIGHT WRIST  Final   Special Requests   Final    BOTTLES DRAWN AEROBIC ONLY Blood Culture results may not be optimal due to an inadequate volume of blood received in culture bottles   Culture   Final    NO GROWTH 5 DAYS Performed at [facility], [place] [address], [place], [place] [address]    Report Status 03/26/2019 FINAL  Final  Culture, blood (Routine X 2) w Reflex to ID Panel     Status: None   Collection Time: 03/21/19 10:01 AM   Specimen: BLOOD RIGHT FOREARM  Result Value Ref Range Status   Specimen Description BLOOD RIGHT FOREARM  Final   Special Requests   Final    BOTTLES DRAWN AEROBIC ONLY Blood Culture results may not be optimal due to an inadequate volume of blood received in culture bottles   Culture   Final    NO GROWTH 5 DAYS Performed at [facility], [place] [address], [place], [place] [address]    Report Status 03/26/2019 FINAL  Final  Culture, blood (Routine X 2) w Reflex to ID Panel     Status: None (Preliminary result)   Collection Time: 03/24/19  8:52 AM   Specimen: BLOOD  Result Value Ref Range Status   Specimen Description BLOOD RIGHT ANTECUBITAL  Final   Special Requests   Final    BOTTLES DRAWN AEROBIC AND ANAEROBIC Blood Culture adequate volume   Culture   Final    NO GROWTH 2 DAYS Performed at [facility], [place] [address], [place], [place] [address]    Report Status PENDING  Incomplete  Culture, blood (Routine X 2) w Reflex to ID Panel     Status: None (Preliminary result)   Collection Time: 03/24/19  8:56 AM   Specimen: BLOOD  Result Value Ref Range Status   Specimen Description BLOOD RIGHT ANTECUBITAL  Final   Special Requests   Final    BOTTLES DRAWN AEROBIC AND ANAEROBIC Blood Culture adequate volume    Culture   Final    NO GROWTH 2 DAYS Performed at [facility], [place] [address] [place], [place] [address]    Report Status PENDING  Incomplete         Radiology Studies: No results found.      Scheduled Meds: . amLODipine  10 mg Oral Daily  . atorvastatin  40 mg Oral q1800  . bisoprolol  10 mg Per Tube BID  . chlorhexidine  15 mL Mouth Rinse BID  . Chlorhexidine Gluconate Cloth  6 each Topical Daily  . feeding supplement (ENSURE ENLIVE)  237 mL Oral BID BM  . feeding supplement (PRO-STAT SUGAR FREE 64)  30 mL Per Tube BID  . furosemide  20 mg Oral Daily  . hydroxypropyl methylcellulose / hypromellose  1 drop Both Eyes QID  . isosorbide mononitrate  60 mg Oral Daily  . losartan  50 mg Per Tube BID  . mouth rinse  15 mL Mouth Rinse q12n4p  . pantoprazole sodium  40 mg Per Tube Daily  . senna-docusate  1 tablet Per Tube BID   Continuous Infusions: . sodium chloride 10 mL/hr at 03/26/19 0453  .  feeding supplement (JEVITY 1.2 CAL) Stopped (03/23/19 0636)     LOS: 9 days        Hosie Poisson, MD [facility] Pager [phone]   If 7PM-7AM, please contact night-coverage [email or web address] Password [facility] 03/26/2019, 6:17 PM

## 2019-03-26 NOTE — TOC Progression Note (Signed)
Transition of Care [facility]) - Progression Note    Patient Details  Name: Jane [name] MRN: [id number] Date of Birth: 1929-06-30  [facility]) CM/SW Contact  Pollie Friar, RN Phone Number: 03/26/2019, 4:39 PM  Clinical Narrative:    Waiting Covid results to be able to d/c to [facility]. [facility] will be able to accept her tomorrow. [name] (nephew) notified of plan.  TOC following.   Expected Discharge Plan: [place] Barriers to Discharge: Continued Medical Work up, SNF Pending bed offer  Expected Discharge Plan and Services Expected Discharge Plan: [place] In-house Referral: Clinical Social Work Discharge Planning Services: CM Consult Post Acute Care Choice: [place] arrangements for the past 2 months: Single Family Home                                       Social Determinants of Health (SDOH) Interventions    Readmission Risk Interventions No flowsheet data found.

## 2019-03-26 NOTE — Progress Notes (Signed)
  Speech Language Pathology Treatment: Dysphagia  Patient Details Name: Jane [name] MRN: [id number] DOB: [birth year] Today's Date: 03/26/2019 Time: [id number] SLP Time Calculation (min) (ACUTE ONLY): 8 min  Assessment / Plan / Recommendation Clinical Impression  Pt sleeping but arousable consuming straw sips grape juice. No coughing and slight increased in respirations. Swallow is audible sometimes indicative of dyscoordination or presence of anatomical difference (osteophyte). Continue Dys 1, thin liquids. Pt continues to need full supervision and assist with meals for adequate positioning, rate and feeding.    HPI HPI: Jane [name] is a 85 y.o. female with a past medical history of stroke, GERD, pna, ectropion of left lower eyelid, hypertension, hyperlipidemia, CHF presents today after being found on the ground by maintenance. MRI A small volume of superimposed subarachnoid hemorrhage layering posteriorly has increased especially in the posterior fossa along the cerebellar convexities. The hemorrhage might have originated in the left periventricular white matter.      SLP Plan  Continue with current plan of care       Recommendations  Diet recommendations: Thin liquid;Dysphagia 1 (puree) Liquids provided via: Cup;Straw Medication Administration: Crushed with puree Supervision: Staff to assist with self feeding;Full supervision/cueing for compensatory strategies Compensations: Slow rate;Small sips/bites Postural Changes and/or Swallow Maneuvers: Seated upright 90 degrees                Oral Care Recommendations: Oral care BID Follow up Recommendations: Skilled Nursing facility SLP Visit Diagnosis: Dysphagia, unspecified (R13.10) Plan: Continue with current plan of care                       Houston Siren 03/26/2019, 4:55 PM  Orbie Pyo Colvin Caroli.Ed Risk analyst [phone] Office [phone]

## 2019-03-27 DIAGNOSIS — R1311 Dysphagia, oral phase: Secondary | ICD-10-CM | POA: Diagnosis not present

## 2019-03-27 DIAGNOSIS — I69191 Dysphagia following nontraumatic intracerebral hemorrhage: Secondary | ICD-10-CM | POA: Diagnosis not present

## 2019-03-27 DIAGNOSIS — J069 Acute upper respiratory infection, unspecified: Secondary | ICD-10-CM | POA: Diagnosis not present

## 2019-03-27 DIAGNOSIS — E876 Hypokalemia: Secondary | ICD-10-CM | POA: Diagnosis not present

## 2019-03-27 DIAGNOSIS — I615 Nontraumatic intracerebral hemorrhage, intraventricular: Secondary | ICD-10-CM | POA: Diagnosis not present

## 2019-03-27 DIAGNOSIS — Z8673 Personal history of transient ischemic attack (TIA), and cerebral infarction without residual deficits: Secondary | ICD-10-CM | POA: Diagnosis not present

## 2019-03-27 DIAGNOSIS — I2510 Atherosclerotic heart disease of native coronary artery without angina pectoris: Secondary | ICD-10-CM | POA: Diagnosis not present

## 2019-03-27 DIAGNOSIS — D699 Hemorrhagic condition, unspecified: Secondary | ICD-10-CM | POA: Diagnosis not present

## 2019-03-27 DIAGNOSIS — N183 Chronic kidney disease, stage 3 (moderate): Secondary | ICD-10-CM | POA: Diagnosis not present

## 2019-03-27 DIAGNOSIS — Z7401 Bed confinement status: Secondary | ICD-10-CM | POA: Diagnosis not present

## 2019-03-27 DIAGNOSIS — K219 Gastro-esophageal reflux disease without esophagitis: Secondary | ICD-10-CM | POA: Diagnosis not present

## 2019-03-27 DIAGNOSIS — F0150 Vascular dementia without behavioral disturbance: Secondary | ICD-10-CM | POA: Diagnosis not present

## 2019-03-27 DIAGNOSIS — E7849 Other hyperlipidemia: Secondary | ICD-10-CM | POA: Diagnosis not present

## 2019-03-27 DIAGNOSIS — D696 Thrombocytopenia, unspecified: Secondary | ICD-10-CM | POA: Diagnosis not present

## 2019-03-27 DIAGNOSIS — I739 Peripheral vascular disease, unspecified: Secondary | ICD-10-CM | POA: Diagnosis not present

## 2019-03-27 DIAGNOSIS — G936 Cerebral edema: Secondary | ICD-10-CM | POA: Diagnosis not present

## 2019-03-27 DIAGNOSIS — R7881 Bacteremia: Secondary | ICD-10-CM | POA: Diagnosis not present

## 2019-03-27 DIAGNOSIS — I6300 Cerebral infarction due to thrombosis of unspecified precerebral artery: Secondary | ICD-10-CM | POA: Diagnosis not present

## 2019-03-27 DIAGNOSIS — F0390 Unspecified dementia without behavioral disturbance: Secondary | ICD-10-CM | POA: Diagnosis not present

## 2019-03-27 DIAGNOSIS — I161 Hypertensive emergency: Secondary | ICD-10-CM | POA: Diagnosis not present

## 2019-03-27 DIAGNOSIS — R41841 Cognitive communication deficit: Secondary | ICD-10-CM | POA: Diagnosis not present

## 2019-03-27 DIAGNOSIS — M2550 Pain in unspecified joint: Secondary | ICD-10-CM | POA: Diagnosis not present

## 2019-03-27 DIAGNOSIS — I10 Essential (primary) hypertension: Secondary | ICD-10-CM | POA: Diagnosis not present

## 2019-03-27 DIAGNOSIS — M6281 Muscle weakness (generalized): Secondary | ICD-10-CM | POA: Diagnosis not present

## 2019-03-27 DIAGNOSIS — F419 Anxiety disorder, unspecified: Secondary | ICD-10-CM | POA: Diagnosis not present

## 2019-03-27 DIAGNOSIS — E785 Hyperlipidemia, unspecified: Secondary | ICD-10-CM | POA: Diagnosis not present

## 2019-03-27 DIAGNOSIS — N179 Acute kidney failure, unspecified: Secondary | ICD-10-CM | POA: Diagnosis not present

## 2019-03-27 DIAGNOSIS — Z8701 Personal history of pneumonia (recurrent): Secondary | ICD-10-CM | POA: Diagnosis not present

## 2019-03-27 DIAGNOSIS — I5032 Chronic diastolic (congestive) heart failure: Secondary | ICD-10-CM | POA: Diagnosis not present

## 2019-03-27 DIAGNOSIS — I351 Nonrheumatic aortic (valve) insufficiency: Secondary | ICD-10-CM | POA: Diagnosis not present

## 2019-03-27 DIAGNOSIS — R5381 Other malaise: Secondary | ICD-10-CM | POA: Diagnosis not present

## 2019-03-27 DIAGNOSIS — I130 Hypertensive heart and chronic kidney disease with heart failure and stage 1 through stage 4 chronic kidney disease, or unspecified chronic kidney disease: Secondary | ICD-10-CM | POA: Diagnosis not present

## 2019-03-27 DIAGNOSIS — J810 Acute pulmonary edema: Secondary | ICD-10-CM | POA: Diagnosis not present

## 2019-03-27 LAB — GLUCOSE, CAPILLARY
Glucose-Capillary: 88 mg/dL (ref 70–99)
Glucose-Capillary: 71 mg/dL (ref 70–99)
Glucose-Capillary: 98 mg/dL (ref 70–99)

## 2019-03-27 MED ORDER — PRO-STAT SUGAR FREE PO LIQD: 30.0000 mL | Freq: Two times a day (BID) | ORAL | 0 refills | Status: DC

## 2019-03-27 MED ORDER — ISOSORBIDE MONONITRATE ER 60 MG PO TB24: 60.0000 mg | ORAL_TABLET | Freq: Every day | ORAL | 0 refills | Status: DC

## 2019-03-27 MED ORDER — AMLODIPINE BESYLATE 10 MG PO TABS: 10.0000 mg | ORAL_TABLET | Freq: Every day | ORAL | 0 refills | Status: DC

## 2019-03-27 MED ORDER — PANTOPRAZOLE SODIUM 40 MG PO PACK: 40.0000 mg | PACK | Freq: Every day | ORAL | 0 refills | Status: AC

## 2019-03-27 MED ORDER — BISOPROLOL FUMARATE 10 MG PO TABS: 10.0000 mg | ORAL_TABLET | Freq: Two times a day (BID) | ORAL | 0 refills | Status: DC

## 2019-03-27 MED ORDER — ENSURE ENLIVE PO LIQD: 237.0000 mL | Freq: Two times a day (BID) | ORAL | 12 refills | Status: AC

## 2019-03-27 NOTE — Progress Notes (Signed)
Report called to Catalina Lunger, RN at [facility].

## 2019-03-27 NOTE — Discharge Summary (Signed)
Physician Discharge Summary  Jane [name] [id number] DOB: 02-14-1929 DOA: 03/17/2019  PCP: Ina Homes, MD  Admit date: 03/17/2019 Discharge date: 03/27/2019  Admitted From: HOME  [facility]   Recommendations for Outpatient Follow-up:  1. Follow up with PCP in 1-2 weeks 2. Please obtain BMP/CBC in one week Please follow up with neurology as recommended.   Discharge Condition:poor prognosis, guarded.  CODE STATUS:full code.  Diet recommendation:  Dysphagia 1 diet  With thin liquid , please assist with food.   Brief/Interim Summary: 85 year old lady with prior h./o hypertension, Hyperlipidemia, CAD, diastolic heart failure, stage 3 CKD, presents with intracranial hemorrhage.   Discharge Diagnoses:  Active Problems:   Stroke (cerebrum) (HCC)   Pressure injury of skin   IVH (intraventricular hemorrhage) (HCC)   Cytotoxic brain edema (HCC)  Left frontal periventricular hemorrhage secondary to uncontrolled hypertension;   Reviewed CT head and CTA Of the head and neck .  MRI of the brain showed stable IVH, increased SAD, no midline shift, no infarct.   2 D echocardiogram showed LVEF of 60% to 65%. No source of embolus.   A!c is 5.2, LDL is 113.  Therapy eval recommending SNF. Pt is medically stable to be discharged to SNF, waiting for a bed.   Appreciate neurology and neurosurgery recommendations.  No anticoagulation secondary to bleed.    Hypertensive Emergency:  Resolved.  Increased imdur to 60 mg daily.  Continue amlodipine, bisoprolol, lasix and cozaar. No new changes.     Pulmonary edema:  Resume lasix 20 mg daily.  Repeat cxr pending.   Hyperlipidemia:  Resume lipitor.    Dysphagia secondary to stroke.  Continue with dysphagia 1 diet with thin liquids.  D/ced cortrak and encourage oral intake.    Acute on STAGE 3 CKD:  Creatinine back to baseline.  No new complaints.    Thrombocytopenia:  Improved after  stopping heparin inj .  Continue to monitor.    E coli bacteremia:  Blood cultures from 9/16 showing E coli , repeat blood cultures negative.  procalcitonin Is negative.  Repeat blood cultures so far negative. Get urine cultures.  Pt has been on rocephin from 9/19 till 9/22 . Ancef from 9/22 till 9/26 for the duration of 7 days.  Stop antibiotics after today's dose.   Discharge Instructions  Discharge Instructions    Ambulatory referral to Neurology   Complete by: As directed    Follow up in stroke clinic at [facility] with Frann Rider, NP in about 4 weeks. If not available, consider Dr. Antony Contras, Dr. Bess Harvest, or Dr. Sarina Ill.   D/c to SNF soon   Discharge instructions   Complete by: As directed    Please follow up with neurology as recommended.     Allergies as of 03/27/2019   No Known Allergies     Medication List    STOP taking these medications   aspirin EC 81 MG tablet     TAKE these medications   acetaminophen 325 MG tablet Commonly known as: TYLENOL Take 325-650 mg by mouth every 6 (six) hours as needed for mild pain or headache.   albuterol 108 (90 Base) MCG/ACT inhaler Commonly known as: VENTOLIN HFA Inhale 1-2 puffs into the lungs every 6 (six) hours as needed for wheezing or shortness of breath.   amLODipine 10 MG tablet Commonly known as: NORVASC Take 1 tablet (10 mg total) by mouth daily. Start taking on: March 28, 2019  atorvastatin 40 MG tablet Commonly known as: LIPITOR Take 1 tablet (40 mg total) by mouth daily at 6 PM.   bisoprolol 10 MG tablet Commonly known as: ZEBETA Place 1 tablet (10 mg total) into feeding tube 2 (two) times daily. What changed:   medication strength  See the new instructions.   feeding supplement (ENSURE ENLIVE) Liqd Take 237 mLs by mouth 2 (two) times daily between meals.   feeding supplement (PRO-STAT SUGAR FREE 64) Liqd Place 30 mLs into feeding tube 2 (two)  times daily.   furosemide 20 MG tablet Commonly known as: LASIX TAKE 1 TABLET(20 MG) BY MOUTH DAILY What changed: See the new instructions.   isosorbide mononitrate 60 MG 24 hr tablet Commonly known as: IMDUR Take 1 tablet (60 mg total) by mouth daily. Start taking on: March 28, 2019 What changed:   medication strength  how much to take   losartan 100 MG tablet Commonly known as: COZAAR TAKE 1 TABLET(100 MG) BY MOUTH DAILY What changed: See the new instructions.   magnesium hydroxide 400 MG/5ML suspension Commonly known as: MILK OF MAGNESIA Take 30 mLs by mouth daily as needed for mild constipation.   pantoprazole sodium 40 mg/20 mL Pack Commonly known as: PROTONIX Take 20 mLs (40 mg total) by mouth daily. Start taking on: March 28, 2019   polyvinyl alcohol 1.4 % ophthalmic solution Commonly known as: Artificial Tears Place 1 drop into both eyes 2 (two) times daily. For dry eyes What changed: additional instructions   senna-docusate 8.6-50 MG tablet Commonly known as: Senna Plus Take 2 tablets by mouth at bedtime. SENNA PLUS TABLET TAKE 2 TABLETS BY MOUTH AT BEDTIME FOR CONSTIPATION What changed: additional instructions       Contact information for follow-up providers    [facility] Follow up in 4 week(s).   Specialty: Neurology Why: stroke clinic. office will call with appt date and time Contact information: [place] [phone]           Contact information for after-discharge care    [facility] .   Service: Skilled Nursing Contact information: [id number] [place] [phone]                 No Known Allergies  Consultations:    Neurology.   Neuro surgery.    Procedures/Studies: Ct Angio Head W Or Wo Contrast  Result Date: 03/18/2019 CLINICAL DATA:  Follow-up intracranial  hemorrhage EXAM: CT ANGIOGRAPHY HEAD AND NECK TECHNIQUE: Multidetector CT imaging of the head and neck was performed using the standard protocol during bolus administration of intravenous contrast. Multiplanar CT image reconstructions and MIPs were obtained to evaluate the vascular anatomy. Carotid stenosis measurements (when applicable) are obtained utilizing NASCET criteria, using the distal internal carotid diameter as the denominator. CONTRAST:  22mL OMNIPAQUE IOHEXOL 350 MG/ML SOLN COMPARISON:  Head CT yesterday. FINDINGS: CTA NECK FINDINGS Aortic arch: Pronounced aortic atherosclerosis. Branching pattern is normal without origin stenosis. Right carotid system: Common carotid artery widely patent to the bifurcation. Calcified plaque at the carotid bifurcation and ICA bulb. Minimal diameter of the proximal ICA measures 2 mm. Compared to a more distal cervical ICA diameter of 5 mm, this indicates a 60% stenosis. Cervical ICA widely patent beyond that. Left carotid system: Common carotid artery is patent to the bifurcation region. Extensive calcified plaque at the carotid bifurcation and ICA bulb. Minimal diameter on  this side is also 2 mm. Compared to a more distal cervical ICA diameter of 5 mm, this indicates a 60% stenosis. Vertebral arteries: Left vertebral artery is dominant. Both vertebral artery origins show calcified plaque with estimated stenosis of 30-50%. Beyond that, both vertebral arteries are patent through the cervical region to the foramen magnum. Skeleton: Chronic degenerative spondylosis. Old partial compression fractures at T3 and T4. Other neck: No mass or lymphadenopathy.  Mild thyroid goiter. Upper chest: Negative Review of the MIP images confirms the above findings CTA HEAD FINDINGS Anterior circulation: Both internal carotid arteries are patent through the skull base and siphon regions. There is siphon atherosclerotic calcification but without stenosis greater than 50%. Anterior and middle  cerebral vessels are patent without proximal stenosis, aneurysm vascular malformation. In the region of the left deep brain hemorrhage, no abnormal vascular structure is identified. Posterior circulation: Atherosclerotic disease of both vertebral artery V4 segments. Stenosis on the right is severe, 70% or greater. Stenosis on the left is moderate, 50-70%. Both vessels reach the basilar. No basilar stenosis is seen. Posterior circulation branch vessels are patent. Venous sinuses: Patent and normal. Anatomic variants: None significant. Review of the MIP images confirms the above findings IMPRESSION: No evidence of underlying vascular lesion in the region of the deep brain hemorrhage on the left. Advanced aortic atherosclerosis. Atherosclerotic disease at both carotid bifurcations with maximal stenosis of 60% on each side. 30-50% stenosis at both vertebral artery origins. 50% or less stenosis in both carotid siphon regions. 70% or greater stenosis of the right vertebral artery V4 segment. 50-70% stenosis of the left vertebral artery V4 segment. Electronically Signed   By: Nelson Chimes M.D.   On: 03/18/2019 10:29   Ct Head Wo Contrast  Result Date: 03/21/2019 CLINICAL DATA:  Intracranial hemorrhage follow up EXAM: CT HEAD WITHOUT CONTRAST TECHNIQUE: Contiguous axial images were obtained from the base of the skull through the vertex without intravenous contrast. COMPARISON:  03/17/2019 FINDINGS: Brain: There is subarachnoid blood over the posterior right convexity, unchanged from the MRI. The lateral ventricles are slightly decompressed relative to the prior study. There is no midline shift. There is no new site of hemorrhage. There is periventricular hypoattenuation compatible with chronic microvascular disease. Vascular: Atherosclerotic calcification of the vertebral and internal carotid arteries at the skull base. No abnormal hyperdensity of the major intracranial arteries or dural venous sinuses. Skull: Large  scalp vertex hematoma Sinuses/Orbits: No fluid levels or advanced mucosal thickening of the visualized paranasal sinuses. No mastoid or middle ear effusion. The orbits are normal. IMPRESSION: 1. Unchanged appearance of large volume intraventricular and small volume extra-axial subarachnoid hemorrhage. No new hemorrhagic site. 2. Slight decrease in size of the lateral ventricles compared to the prior study. 3. Large scalp vertex hematoma. Electronically Signed   By: Ulyses Jarred M.D.   On: 03/21/2019 03:34   Ct Head Wo Contrast  Addendum Date: 03/17/2019   ADDENDUM REPORT: 03/17/2019 22:19 ADDENDUM: Cervical spine: Comparison 10/16/2018 Alignment: There is loss of cervical lordosis. This may be secondary to splinting, soft tissue injury, or positioning. There is stable anterolisthesis of C2 on C3, C3 on C4, and C4 on C5. Skull base and vertebrae: No acute fracture. Disc spaces: Significant degenerative changes are identified throughout the cervical spine. Soft tissues and spinal canal: No acute abnormality. UPPER chest: No acute abnormality. Other: Carotid calcifications bilaterally. IMPRESSION: Significant stable degenerative changes throughout the cervical spine. No evidence for acute abnormality. Electronically Signed   By: Nolon Nations  M.D.   On: 03/17/2019 22:19   Result Date: 03/17/2019 CLINICAL DATA:  Pt found by the maintenance man. Last seen by family on Monday. Pt altered level of consciousness. Hypertension and strong smell of amnionia EXAM: CT HEAD WITHOUT CONTRAST; CT CERVICAL SPINE WITHOUT CONTRAST TECHNIQUE: Contiguous axial images were obtained from the base of the skull through the vertex without intravenous contrast. COMPARISON:  07/01/2018 FINDINGS: Brain: Significant blood identified within the LATERAL ventricles, LEFT greater than RIGHT. Blood is also identified in the third and fourth ventricles, layering dependently. There is parenchymal hemorrhage within the frontal lobe  periventricular white matter, adjacent to the body of the LEFT LATERAL ventricle and contiguous with the intraventricular blood. This focus of hemorrhage is estimated to measure 9 x 9 millimeters. There is central and cortical atrophy. Periventricular white matter changes are consistent with small vessel disease. Vascular: There is atherosclerotic calcification of the internal carotid arteries. No hyperdense vessels. Skull: Normal. Negative for fracture or focal lesion. Sinuses/Orbits: No acute finding. Other: 11 significant scalp1 edema and hematoma along the vertex, not associated with underlying fracture. IMPRESSION: 1. Large intraventricular hemorrhage with ventricular dilatation. 2. 9 millimeter intraparenchymal hemorrhage within the frontal lobe periventricular white matter, adjacent to the body of the LEFT LATERAL ventricle. 3. Atrophy and small vessel disease. 4. Significant scalp edema and hematoma along the vertex, not associated with underlying fracture. Critical Value/emergent results were called by telephone at the time of interpretation on 03/17/2019 at 7:00 pm to [facility] , who verbally acknowledged these results. Electronically Signed: By: Nolon Nations M.D. On: 03/17/2019 19:06   Ct Angio Neck W Or Wo Contrast  Result Date: 03/18/2019 CLINICAL DATA:  Follow-up intracranial hemorrhage EXAM: CT ANGIOGRAPHY HEAD AND NECK TECHNIQUE: Multidetector CT imaging of the head and neck was performed using the standard protocol during bolus administration of intravenous contrast. Multiplanar CT image reconstructions and MIPs were obtained to evaluate the vascular anatomy. Carotid stenosis measurements (when applicable) are obtained utilizing NASCET criteria, using the distal internal carotid diameter as the denominator. CONTRAST:  59mL OMNIPAQUE IOHEXOL 350 MG/ML SOLN COMPARISON:  Head CT yesterday. FINDINGS: CTA NECK FINDINGS Aortic arch: Pronounced aortic atherosclerosis. Branching  pattern is normal without origin stenosis. Right carotid system: Common carotid artery widely patent to the bifurcation. Calcified plaque at the carotid bifurcation and ICA bulb. Minimal diameter of the proximal ICA measures 2 mm. Compared to a more distal cervical ICA diameter of 5 mm, this indicates a 60% stenosis. Cervical ICA widely patent beyond that. Left carotid system: Common carotid artery is patent to the bifurcation region. Extensive calcified plaque at the carotid bifurcation and ICA bulb. Minimal diameter on this side is also 2 mm. Compared to a more distal cervical ICA diameter of 5 mm, this indicates a 60% stenosis. Vertebral arteries: Left vertebral artery is dominant. Both vertebral artery origins show calcified plaque with estimated stenosis of 30-50%. Beyond that, both vertebral arteries are patent through the cervical region to the foramen magnum. Skeleton: Chronic degenerative spondylosis. Old partial compression fractures at T3 and T4. Other neck: No mass or lymphadenopathy.  Mild thyroid goiter. Upper chest: Negative Review of the MIP images confirms the above findings CTA HEAD FINDINGS Anterior circulation: Both internal carotid arteries are patent through the skull base and siphon regions. There is siphon atherosclerotic calcification but without stenosis greater than 50%. Anterior and middle cerebral vessels are patent without proximal stenosis, aneurysm vascular malformation. In the region of the left deep brain hemorrhage, no abnormal  vascular structure is identified. Posterior circulation: Atherosclerotic disease of both vertebral artery V4 segments. Stenosis on the right is severe, 70% or greater. Stenosis on the left is moderate, 50-70%. Both vessels reach the basilar. No basilar stenosis is seen. Posterior circulation branch vessels are patent. Venous sinuses: Patent and normal. Anatomic variants: None significant. Review of the MIP images confirms the above findings IMPRESSION: No  evidence of underlying vascular lesion in the region of the deep brain hemorrhage on the left. Advanced aortic atherosclerosis. Atherosclerotic disease at both carotid bifurcations with maximal stenosis of 60% on each side. 30-50% stenosis at both vertebral artery origins. 50% or less stenosis in both carotid siphon regions. 70% or greater stenosis of the right vertebral artery V4 segment. 50-70% stenosis of the left vertebral artery V4 segment. Electronically Signed   By: Nelson Chimes M.D.   On: 03/18/2019 10:29   Ct Cervical Spine Wo Contrast  Addendum Date: 03/17/2019   ADDENDUM REPORT: 03/17/2019 22:19 ADDENDUM: Cervical spine: Comparison 10/16/2018 Alignment: There is loss of cervical lordosis. This may be secondary to splinting, soft tissue injury, or positioning. There is stable anterolisthesis of C2 on C3, C3 on C4, and C4 on C5. Skull base and vertebrae: No acute fracture. Disc spaces: Significant degenerative changes are identified throughout the cervical spine. Soft tissues and spinal canal: No acute abnormality. UPPER chest: No acute abnormality. Other: Carotid calcifications bilaterally. IMPRESSION: Significant stable degenerative changes throughout the cervical spine. No evidence for acute abnormality. Electronically Signed   By: Nolon Nations M.D.   On: 03/17/2019 22:19   Result Date: 03/17/2019 CLINICAL DATA:  Pt found by the maintenance man. Last seen by family on Monday. Pt altered level of consciousness. Hypertension and strong smell of amnionia EXAM: CT HEAD WITHOUT CONTRAST; CT CERVICAL SPINE WITHOUT CONTRAST TECHNIQUE: Contiguous axial images were obtained from the base of the skull through the vertex without intravenous contrast. COMPARISON:  07/01/2018 FINDINGS: Brain: Significant blood identified within the LATERAL ventricles, LEFT greater than RIGHT. Blood is also identified in the third and fourth ventricles, layering dependently. There is parenchymal hemorrhage within the frontal  lobe periventricular white matter, adjacent to the body of the LEFT LATERAL ventricle and contiguous with the intraventricular blood. This focus of hemorrhage is estimated to measure 9 x 9 millimeters. There is central and cortical atrophy. Periventricular white matter changes are consistent with small vessel disease. Vascular: There is atherosclerotic calcification of the internal carotid arteries. No hyperdense vessels. Skull: Normal. Negative for fracture or focal lesion. Sinuses/Orbits: No acute finding. Other: 11 significant scalp1 edema and hematoma along the vertex, not associated with underlying fracture. IMPRESSION: 1. Large intraventricular hemorrhage with ventricular dilatation. 2. 9 millimeter intraparenchymal hemorrhage within the frontal lobe periventricular white matter, adjacent to the body of the LEFT LATERAL ventricle. 3. Atrophy and small vessel disease. 4. Significant scalp edema and hematoma along the vertex, not associated with underlying fracture. Critical Value/emergent results were called by telephone at the time of interpretation on 03/17/2019 at 7:00 pm to [facility] , who verbally acknowledged these results. Electronically Signed: By: Nolon Nations M.D. On: 03/17/2019 19:06   Mr Brain Wo Contrast  Result Date: 03/19/2019 CLINICAL DATA:  85 year old female with acute intracranial hemorrhage. EXAM: MRI HEAD WITHOUT CONTRAST TECHNIQUE: Multiplanar, multiecho pulse sequences of the brain and surrounding structures were obtained without intravenous contrast. COMPARISON:  CT head and CTA head and neck recently. Brain MRI 02/04/2016. FINDINGS: Brain: Stable large volume intraventricular hemorrhage from the recent CTs. A  small volume of superimposed subarachnoid hemorrhage layering posteriorly has increased (series 11, image 15), especially in the posterior fossa along the cerebellar convexities (series 11, image 9). No definite subdural space hematoma. As suspected  previously, there does appear to be a tiny intra-axial focus of blood in the left periventricular white matter on series 14, image 14. Stable ventricle size and configuration. Up to mild transependymal edema. Basilar cisterns remain patent. Trace rightward midline shift. No superimposed restricted diffusion to suggest acute infarct. There is subependymal susceptibility on DWI. Chronic Patchy and confluent cerebral white matter T2 and FLAIR hyperintensity has increased since 2017. The scattered subarachnoid blood limits the evaluation for underlying chronic microhemorrhage today. Negative pituitary.  Stable cervicomedullary junction. Vascular: Major intracranial vascular flow voids are stable since 2017. Dominant left vertebral artery. Skull and upper cervical spine: Advanced cervical spine degeneration with spinal stenosis beginning at the C2 level, may have progressed since 2017. Bone marrow signal is within normal limits. Sinuses/Orbits: Negative orbits and paranasal sinuses. Other: Lobulated vertex scalp hematoma on series 9, image 12. Mastoids are well pneumatized. IMPRESSION: 1. Stable large volume of intraventricular hemorrhage, although superimposed subarachnoid hemorrhage volume appears increased from the CT on 03/17/2019 suggesting a degree of continued bleeding. 2. Stable ventriculomegaly, with mild transependymal edema. No significant midline shift. Basilar cisterns remain patent. 3. As before, the hemorrhage might have originated in the left periventricular white matter. No superimposed acute infarct. No underlying lesion is identified in the absence of IV contrast. 4. Scalp hematoma at the vertex re-identified. Electronically Signed   By: Genevie Ann M.D.   On: 03/19/2019 00:35   Dg Pelvis Portable  Result Date: 03/17/2019 CLINICAL DATA:  Recent fall with pelvic pain, initial encounter EXAM: PORTABLE PELVIS 1-2 VIEWS COMPARISON:  None. FINDINGS: Pelvic ring is intact. Degenerative changes of the lumbar  spine and hip joints are seen. Diffuse vascular calcifications are noted. No acute bony abnormality is noted. IMPRESSION: No acute abnormality noted. Electronically Signed   By: Inez Catalina M.D.   On: 03/17/2019 19:09   Dg Chest Port 1 View  Result Date: 03/26/2019 CLINICAL DATA:  Follow-up. Admitted patient with intracranial hemorrhage. EXAM: PORTABLE CHEST 1 VIEW COMPARISON:  Radiograph 03/22/2019, 03/20/2019 FINDINGS: Enteric tube is been removed. Cardiomegaly is unchanged. Improved vascular congestion from prior exam. Retrocardiac atelectasis. Atherosclerosis of the thoracic aorta. No large pleural effusion. No pneumothorax. Degenerative change in the shoulders. IMPRESSION: 1. Unchanged cardiomegaly. Improved vascular congestion from prior exam. 2. Minor retrocardiac atelectasis. 3.  Aortic Atherosclerosis (ICD10-I70.0). Electronically Signed   By: Keith Rake M.D.   On: 03/26/2019 19:31   Dg Chest Port 1 View  Result Date: 03/22/2019 CLINICAL DATA:  Shortness of breath EXAM: PORTABLE CHEST 1 VIEW COMPARISON:  03/20/2019 FINDINGS: Cardiomegaly. Mild vascular congestion. No confluent opacities, effusions or overt edema. No acute bony abnormality. IMPRESSION: Mild cardiomegaly.  Vascular congestion. Electronically Signed   By: Rolm Baptise M.D.   On: 03/22/2019 19:01   Dg Chest Port 1 View  Result Date: 03/20/2019 CLINICAL DATA:  Shortness of breath. EXAM: PORTABLE CHEST 1 VIEW COMPARISON:  Chest x-ray dated March 17, 2019. FINDINGS: The feeding tube entering the stomach with the tip below the field of view. Unchanged cardiomegaly. Atherosclerotic calcification of the aortic arch. Largely resolved interstitial edema and pulmonary vascular congestion. No focal consolidation, pleural effusion, or pneumothorax. No acute osseous abnormality. IMPRESSION: 1. Largely resolved vascular congestion and interstitial edema. Electronically Signed   By: Orville Govern.D.  On: 03/20/2019 11:57   Dg  Chest Port 1 View  Result Date: 03/17/2019 CLINICAL DATA:  Altered level of consciousness EXAM: PORTABLE CHEST 1 VIEW COMPARISON:  Radiograph 10/20/2018, CT 09/17/2016 FINDINGS: Cardiomegaly and central venous congestion, similar to prior. Suspect mild interstitial edema as well with hazy interstitial opacities and indistinct cephalized vascularity. No focal consolidative opacity. No pneumothorax or effusion. The aorta is calcified. The remaining cardiomediastinal contours are unremarkable. Degenerative changes are present in the spine and shoulders including a high-riding appearance of the right humeral head likely reflecting chronic rotator cuff insufficiency. IMPRESSION: Cardiomegaly with mild interstitial edema. Electronically Signed   By: Lovena Le M.D.   On: 03/17/2019 19:08       Subjective: No complaints.   Discharge Exam: Vitals:   03/27/19 0734 03/27/19 1141  BP: (!) 176/62 (!) 109/52  Pulse: 65 70  Resp: 16 16  Temp: 98.6 F (37 C) 98.9 F (37.2 C)  SpO2: 97% 98%   Vitals:   03/27/19 0432 03/27/19 0500 03/27/19 0734 03/27/19 1141  BP: (!) 171/67  (!) 176/62 (!) 109/52  Pulse: 68  65 70  Resp: 16  16 16   Temp: 98.8 F (37.1 C)  98.6 F (37 C) 98.9 F (37.2 C)  TempSrc: Axillary  Oral Oral  SpO2: 100%  97% 98%  Weight:  72.4 kg    Height:        General: Pt is alert, awake, not in acute distress Cardiovascular: RRR, S1/S2 +,  Respiratory: CTA bilaterally, no wheezing, no rhonchi Abdominal: Soft, NT, ND, bowel sounds + Extremities: no edema, no cyanosis    The results of significant diagnostics from this hospitalization (including imaging, microbiology, ancillary and laboratory) are listed below for reference.     Microbiology: Recent Results (from the past 240 hour(s))  Culture, blood (routine x 2)     Status: Abnormal   Collection Time: 03/17/19  4:10 PM   Specimen: BLOOD  Result Value Ref Range Status   Specimen Description BLOOD LEFT ANTECUBITAL   Final   Special Requests   Final    BOTTLES DRAWN AEROBIC AND ANAEROBIC Blood Culture results may not be optimal due to an inadequate volume of blood received in culture bottles   Culture  Setup Time   Final    GRAM POSITIVE COCCI IN BOTH AEROBIC AND ANAEROBIC BOTTLES CRITICAL RESULT CALLED TO, READ BACK BY AND VERIFIED WITH: PHARMD T BAUMEISTER [id number] AT 1330BY DV    Culture (A)  Final    STAPHYLOCOCCUS SPECIES (COAGULASE NEGATIVE) THE SIGNIFICANCE OF ISOLATING THIS ORGANISM FROM A SINGLE SET OF BLOOD CULTURES WHEN MULTIPLE SETS ARE DRAWN IS UNCERTAIN. PLEASE NOTIFY THE MICROBIOLOGY DEPARTMENT WITHIN ONE WEEK IF SPECIATION AND SENSITIVITIES ARE REQUIRED. ESCHERICHIA COLI CRITICAL RESULT CALLED TO, READ BACK BY AND VERIFIED WITH: Sharen Heck PHARMD, AT [id number] 03/20/19 REGARDING GRAM NEGATIVE ROD Performed at [facility], [place] [address] [place], [place] [address]    Report Status 03/21/2019 FINAL  Final   Organism ID, Bacteria ESCHERICHIA COLI  Final      Susceptibility   Escherichia coli - MIC*    AMPICILLIN >=32 RESISTANT Resistant     CEFAZOLIN <=4 SENSITIVE Sensitive     CEFEPIME <=1 SENSITIVE Sensitive     CEFTAZIDIME <=1 SENSITIVE Sensitive     CEFTRIAXONE <=1 SENSITIVE Sensitive     CIPROFLOXACIN >=4 RESISTANT Resistant     GENTAMICIN >=16 RESISTANT Resistant     IMIPENEM <=0.25 SENSITIVE Sensitive  TRIMETH/SULFA >=320 RESISTANT Resistant     AMPICILLIN/SULBACTAM 16 INTERMEDIATE Intermediate     PIP/TAZO <=4 SENSITIVE Sensitive     Extended ESBL NEGATIVE Sensitive     * ESCHERICHIA COLI  Blood Culture ID Panel (Reflexed)     Status: Abnormal   Collection Time: 03/17/19  4:10 PM  Result Value Ref Range Status   Enterococcus species NOT DETECTED NOT DETECTED Final   Listeria monocytogenes NOT DETECTED NOT DETECTED Final   Staphylococcus species DETECTED (A) NOT DETECTED Final    Comment: Methicillin (oxacillin) resistant coagulase negative staphylococcus. Possible  blood culture contaminant (unless isolated from more than one blood culture draw or clinical case suggests pathogenicity). No antibiotic treatment is indicated for blood  culture contaminants. CRITICAL RESULT CALLED TO, READ BACK BY AND VERIFIED WITH: PHARMD T BAUMEISTER [id number] AT 1330 BY DV    Staphylococcus aureus (BCID) NOT DETECTED NOT DETECTED Final   Methicillin resistance DETECTED (A) NOT DETECTED Final    Comment: CRITICAL RESULT CALLED TO, READ BACK BY AND VERIFIED WITH: PHARMD T BAUMEISTER [id number] AT 1330 BY DV    Streptococcus species NOT DETECTED NOT DETECTED Final   Streptococcus agalactiae NOT DETECTED NOT DETECTED Final   Streptococcus pneumoniae NOT DETECTED NOT DETECTED Final   Streptococcus pyogenes NOT DETECTED NOT DETECTED Final   Acinetobacter baumannii NOT DETECTED NOT DETECTED Final   Enterobacteriaceae species NOT DETECTED NOT DETECTED Final   Enterobacter cloacae complex NOT DETECTED NOT DETECTED Final   Escherichia coli NOT DETECTED NOT DETECTED Final   Klebsiella oxytoca NOT DETECTED NOT DETECTED Final   Klebsiella pneumoniae NOT DETECTED NOT DETECTED Final   Proteus species NOT DETECTED NOT DETECTED Final   Serratia marcescens NOT DETECTED NOT DETECTED Final   Haemophilus influenzae NOT DETECTED NOT DETECTED Final   Neisseria meningitidis NOT DETECTED NOT DETECTED Final   Pseudomonas aeruginosa NOT DETECTED NOT DETECTED Final   Candida albicans NOT DETECTED NOT DETECTED Final   Candida glabrata NOT DETECTED NOT DETECTED Final   Candida krusei NOT DETECTED NOT DETECTED Final   Candida parapsilosis NOT DETECTED NOT DETECTED Final   Candida tropicalis NOT DETECTED NOT DETECTED Final    Comment: Performed at [facility], [address] [place], [place] [address]  Culture, blood (routine x 2)     Status: None   Collection Time: 03/17/19  7:15 PM   Specimen: BLOOD  Result Value Ref Range Status   Specimen Description BLOOD RIGHT ANTECUBITAL  Final    Special Requests   Final    BOTTLES DRAWN AEROBIC ONLY Blood Culture results may not be optimal due to an inadequate volume of blood received in culture bottles   Culture   Final    NO GROWTH 5 DAYS Performed at [facility], [place] [address], [place], [place] [address]    Report Status 03/22/2019 FINAL  Final  SARS Coronavirus 2 [facility] order, Performed in [facility] hospital lab) Nasopharyngeal Nasopharyngeal Swab     Status: None   Collection Time: 03/17/19  7:37 PM   Specimen: Nasopharyngeal Swab  Result Value Ref Range Status   SARS Coronavirus 2 NEGATIVE NEGATIVE Final    Comment: (NOTE) If result is NEGATIVE SARS-CoV-2 target nucleic acids are NOT DETECTED. The SARS-CoV-2 RNA is generally detectable in upper and lower  respiratory specimens during the acute phase of infection. The lowest  concentration of SARS-CoV-2 viral copies this assay can detect is 250  copies / mL. A negative result does not preclude  SARS-CoV-2 infection  and should not be used as the sole basis for treatment or other  patient management decisions.  A negative result may occur with  improper specimen collection / handling, submission of specimen other  than nasopharyngeal swab, presence of viral mutation(s) within the  areas targeted by this assay, and inadequate number of viral copies  (<250 copies / mL). A negative result must be combined with clinical  observations, patient history, and epidemiological information. If result is POSITIVE SARS-CoV-2 target nucleic acids are DETECTED. The SARS-CoV-2 RNA is generally detectable in upper and lower  respiratory specimens dur ing the acute phase of infection.  Positive  results are indicative of active infection with SARS-CoV-2.  Clinical  correlation with patient history and other diagnostic information is  necessary to determine patient infection status.  Positive results do  not rule out bacterial infection or co-infection with other viruses. If  result is PRESUMPTIVE POSTIVE SARS-CoV-2 nucleic acids MAY BE PRESENT.   A presumptive positive result was obtained on the submitted specimen  and confirmed on repeat testing.  While 2019 novel coronavirus  (SARS-CoV-2) nucleic acids may be present in the submitted sample  additional confirmatory testing may be necessary for epidemiological  and / or clinical management purposes  to differentiate between  SARS-CoV-2 and other Sarbecovirus currently known to infect humans.  If clinically indicated additional testing with an alternate test  methodology [phone]) is advised. The SARS-CoV-2 RNA is generally  detectable in upper and lower respiratory sp ecimens during the acute  phase of infection. The expected result is Negative. Fact Sheet for Patients:  StrictlyIdeas.no Fact Sheet for Healthcare Providers: BankingDealers.co.za This test is not yet approved or cleared by the [place] FDA and has been authorized for detection and/or diagnosis of SARS-CoV-2 by FDA under an Emergency Use Authorization (EUA).  This EUA will remain in effect (meaning this test can be used) for the duration of the COVID-19 declaration under Section 564(b)(1) of the Act, 21 U.S.C. section 360bbb-3(b)(1), unless the authorization is terminated or revoked sooner. Performed at [facility], [place] [address] [place], [place] [address]   MRSA PCR Screening     Status: None   Collection Time: 03/17/19 11:28 PM   Specimen: Nasopharyngeal  Result Value Ref Range Status   MRSA by PCR NEGATIVE NEGATIVE Final    Comment:        The GeneXpert MRSA Assay (FDA approved for NASAL specimens only), is one component of a comprehensive MRSA colonization surveillance program. It is not intended to diagnose MRSA infection nor to guide or monitor treatment for MRSA infections. Performed at [facility], [place] [address], [place], [place] [address]   Urine  culture     Status: None   Collection Time: 03/19/19 12:19 PM   Specimen: Urine, Random  Result Value Ref Range Status   Specimen Description URINE, RANDOM  Final   Special Requests NONE  Final   Culture   Final    NO GROWTH Performed at [facility], [place] [address], [place],  [address]    Report Status 03/20/2019 FINAL  Final  Culture, blood (Routine X 2) w Reflex to ID Panel     Status: None   Collection Time: 03/21/19 10:01 AM   Specimen: BLOOD RIGHT WRIST  Result Value Ref Range Status   Specimen Description BLOOD RIGHT WRIST  Final   Special Requests   Final    BOTTLES DRAWN AEROBIC ONLY Blood Culture  results may not be optimal due to an inadequate volume of blood received in culture bottles   Culture   Final    NO GROWTH 5 DAYS Performed at [facility], [place] [address] [place], [place] [address]    Report Status 03/26/2019 FINAL  Final  Culture, blood (Routine X 2) w Reflex to ID Panel     Status: None   Collection Time: 03/21/19 10:01 AM   Specimen: BLOOD RIGHT FOREARM  Result Value Ref Range Status   Specimen Description BLOOD RIGHT FOREARM  Final   Special Requests   Final    BOTTLES DRAWN AEROBIC ONLY Blood Culture results may not be optimal due to an inadequate volume of blood received in culture bottles   Culture   Final    NO GROWTH 5 DAYS Performed at [facility], [place] [address], [place], [place] [address]    Report Status 03/26/2019 FINAL  Final  Culture, blood (Routine X 2) w Reflex to ID Panel     Status: None (Preliminary result)   Collection Time: 03/24/19  8:52 AM   Specimen: BLOOD  Result Value Ref Range Status   Specimen Description BLOOD RIGHT ANTECUBITAL  Final   Special Requests   Final    BOTTLES DRAWN AEROBIC AND ANAEROBIC Blood Culture adequate volume   Culture   Final    NO GROWTH 3 DAYS Performed at [facility], [place] [address] [place], [place] [address]    Report Status PENDING  Incomplete  Culture, blood  (Routine X 2) w Reflex to ID Panel     Status: None (Preliminary result)   Collection Time: 03/24/19  8:56 AM   Specimen: BLOOD  Result Value Ref Range Status   Specimen Description BLOOD RIGHT ANTECUBITAL  Final   Special Requests   Final    BOTTLES DRAWN AEROBIC AND ANAEROBIC Blood Culture adequate volume   Culture   Final    NO GROWTH 3 DAYS Performed at [facility], [place] [address] [place], [place] [address]    Report Status PENDING  Incomplete  Novel Coronavirus, NAA (Hosp order, Send-out to Ref Lab; TAT 18-24 hrs     Status: None   Collection Time: 03/25/19  4:57 PM   Specimen: Nasopharyngeal Swab; Respiratory  Result Value Ref Range Status   SARS-CoV-2, NAA NOT DETECTED NOT DETECTED Final    Comment: (NOTE) This nucleic acid amplification test was developed and its performance characteristics determined by [employer]. Nucleic acid amplification tests include PCR and TMA. This test has not been FDA cleared or approved. This test has been authorized by FDA under an Emergency Use Authorization (EUA). This test is only authorized for the duration of time the declaration that circumstances exist justifying the authorization of the emergency use of in vitro diagnostic tests for detection of SARS-CoV-2 virus and/or diagnosis of COVID-19 infection under section 564(b)(1) of the Act, 21 U.S.C. [id number]) (1), unless the authorization is terminated or revoked sooner. When diagnostic testing is negative, the possibility of a false negative result should be considered in the context of a patient's recent exposures and the presence of clinical signs and symptoms consistent with COVID-19. An individual without symptoms of COVID- 19 and who is not shedding SARS-CoV-2 vi rus would expect to have a negative (not detected) result in this assay. Performed At: [facility] [address] [place], [place] [id number] Rush Farmer MD [id number]    Gary  Final  Comment: Performed at [facility], [place] [address] [place], [place] [address]     Labs: BNP (last 3 results) Recent Labs    07/25/18 0334 10/20/18 0055  BNP 382.8* 0000000*   Basic Metabolic Panel: Recent Labs  Lab 03/21/19 0332 03/22/19 0602 03/23/19 0117 03/24/19 0512 03/25/19 0346  NA 146* 142 139 133* 132*  K 3.6 3.8 3.6 4.2 4.1  CL 116* 108 107 101 97*  CO2 20* 23 24 20* 24  GLUCOSE 105* 110* 123* 118* 131*  BUN 33* 26* 31* 38* 42*  CREATININE 1.18* 0.97 1.03* 0.99 0.99  CALCIUM 8.2* 8.0* 7.6* 7.9* 8.1*   Liver Function Tests: No results for input(s): AST, ALT, ALKPHOS, BILITOT, PROT, ALBUMIN in the last 168 hours. No results for input(s): LIPASE, AMYLASE in the last 168 hours. No results for input(s): AMMONIA in the last 168 hours. CBC: Recent Labs  Lab 03/21/19 0332 03/22/19 0602 03/23/19 0117 03/24/19 0512 03/25/19 0346  WBC 4.7 6.8 6.0 5.3 6.1  HGB 10.0* 10.7* 9.7* 10.5* 10.1*  HCT 29.7* 31.8* 27.9* 30.9* 30.1*  MCV 99.0 97.8 97.2 98.4 97.4  PLT 73* 60* 90* 80* 121*   Cardiac Enzymes: No results for input(s): CKTOTAL, CKMB, CKMBINDEX, TROPONINI in the last 168 hours. BNP: Invalid input(s): POCBNP CBG: Recent Labs  Lab 03/26/19 2024 03/26/19 2356 03/27/19 0438 03/27/19 0729 03/27/19 1133  GLUCAP 87 96 88 71 98   D-Dimer No results for input(s): DDIMER in the last 72 hours. Hgb A1c No results for input(s): HGBA1C in the last 72 hours. Lipid Profile No results for input(s): CHOL, HDL, LDLCALC, TRIG, CHOLHDL, LDLDIRECT in the last 72 hours. Thyroid function studies No results for input(s): TSH, T4TOTAL, T3FREE, THYROIDAB in the last 72 hours.  Invalid input(s): FREET3 Anemia work up No results for input(s): VITAMINB12, FOLATE, FERRITIN, TIBC, IRON, RETICCTPCT in the last 72 hours. Urinalysis    Component Value Date/Time   COLORURINE YELLOW 03/24/2019 1117   APPEARANCEUR HAZY (A) 03/24/2019 1117    LABSPEC 1.020 03/24/2019 1117   PHURINE 6.0 03/24/2019 1117   GLUCOSEU NEGATIVE 03/24/2019 1117   [place] 03/24/2019 1117   [place] 03/24/2019 1117   [place] 03/24/2019 1117   PROTEINUR >=300 (A) 03/24/2019 1117   UROBILINOGEN 0.2 02/05/2015 0555   NITRITE NEGATIVE 03/24/2019 1117   LEUKOCYTESUR TRACE (A) 03/24/2019 1117   Sepsis Labs Invalid input(s): PROCALCITONIN,  WBC,  LACTICIDVEN Microbiology Recent Results (from the past 240 hour(s))  Culture, blood (routine x 2)     Status: Abnormal   Collection Time: 03/17/19  4:10 PM   Specimen: BLOOD  Result Value Ref Range Status   Specimen Description BLOOD LEFT ANTECUBITAL  Final   Special Requests   Final    BOTTLES DRAWN AEROBIC AND ANAEROBIC Blood Culture results may not be optimal due to an inadequate volume of blood received in culture bottles   Culture  Setup Time   Final    GRAM POSITIVE COCCI IN BOTH AEROBIC AND ANAEROBIC BOTTLES CRITICAL RESULT CALLED TO, READ BACK BY AND VERIFIED WITH: PHARMD T BAUMEISTER [id number] AT 1330BY DV    Culture (A)  Final    STAPHYLOCOCCUS SPECIES (COAGULASE NEGATIVE) THE SIGNIFICANCE OF ISOLATING THIS ORGANISM FROM A SINGLE SET OF BLOOD CULTURES WHEN MULTIPLE SETS ARE DRAWN IS UNCERTAIN. PLEASE NOTIFY THE MICROBIOLOGY DEPARTMENT WITHIN ONE WEEK IF SPECIATION AND SENSITIVITIES ARE REQUIRED. ESCHERICHIA COLI CRITICAL RESULT CALLED TO, READ BACK BY AND VERIFIED WITH: T. BAUMEISTER PHARMD,  AT [id number] 03/20/19 REGARDING GRAM NEGATIVE ROD Performed at [facility], [place] [address], [place], [place] [address]    Report Status 03/21/2019 FINAL  Final   Organism ID, Bacteria ESCHERICHIA COLI  Final      Susceptibility   Escherichia coli - MIC*    AMPICILLIN >=32 RESISTANT Resistant     CEFAZOLIN <=4 SENSITIVE Sensitive     CEFEPIME <=1 SENSITIVE Sensitive     CEFTAZIDIME <=1 SENSITIVE Sensitive     CEFTRIAXONE <=1 SENSITIVE Sensitive     CIPROFLOXACIN >=4 RESISTANT  Resistant     GENTAMICIN >=16 RESISTANT Resistant     IMIPENEM <=0.25 SENSITIVE Sensitive     TRIMETH/SULFA >=320 RESISTANT Resistant     AMPICILLIN/SULBACTAM 16 INTERMEDIATE Intermediate     PIP/TAZO <=4 SENSITIVE Sensitive     Extended ESBL NEGATIVE Sensitive     * ESCHERICHIA COLI  Blood Culture ID Panel (Reflexed)     Status: Abnormal   Collection Time: 03/17/19  4:10 PM  Result Value Ref Range Status   Enterococcus species NOT DETECTED NOT DETECTED Final   Listeria monocytogenes NOT DETECTED NOT DETECTED Final   Staphylococcus species DETECTED (A) NOT DETECTED Final    Comment: Methicillin (oxacillin) resistant coagulase negative staphylococcus. Possible blood culture contaminant (unless isolated from more than one blood culture draw or clinical case suggests pathogenicity). No antibiotic treatment is indicated for blood  culture contaminants. CRITICAL RESULT CALLED TO, READ BACK BY AND VERIFIED WITH: PHARMD T BAUMEISTER [id number] AT 1330 BY DV    Staphylococcus aureus (BCID) NOT DETECTED NOT DETECTED Final   Methicillin resistance DETECTED (A) NOT DETECTED Final    Comment: CRITICAL RESULT CALLED TO, READ BACK BY AND VERIFIED WITH: PHARMD T BAUMEISTER [id number] AT 1330 BY DV    Streptococcus species NOT DETECTED NOT DETECTED Final   Streptococcus agalactiae NOT DETECTED NOT DETECTED Final   Streptococcus pneumoniae NOT DETECTED NOT DETECTED Final   Streptococcus pyogenes NOT DETECTED NOT DETECTED Final   Acinetobacter baumannii NOT DETECTED NOT DETECTED Final   Enterobacteriaceae species NOT DETECTED NOT DETECTED Final   Enterobacter cloacae complex NOT DETECTED NOT DETECTED Final   Escherichia coli NOT DETECTED NOT DETECTED Final   Klebsiella oxytoca NOT DETECTED NOT DETECTED Final   Klebsiella pneumoniae NOT DETECTED NOT DETECTED Final   Proteus species NOT DETECTED NOT DETECTED Final   Serratia marcescens NOT DETECTED NOT DETECTED Final   Haemophilus influenzae NOT DETECTED NOT  DETECTED Final   Neisseria meningitidis NOT DETECTED NOT DETECTED Final   Pseudomonas aeruginosa NOT DETECTED NOT DETECTED Final   Candida albicans NOT DETECTED NOT DETECTED Final   Candida glabrata NOT DETECTED NOT DETECTED Final   Candida krusei NOT DETECTED NOT DETECTED Final   Candida parapsilosis NOT DETECTED NOT DETECTED Final   Candida tropicalis NOT DETECTED NOT DETECTED Final    Comment: Performed at [facility], [address] [place], [place] [address]  Culture, blood (routine x 2)     Status: None   Collection Time: 03/17/19  7:15 PM   Specimen: BLOOD  Result Value Ref Range Status   Specimen Description BLOOD RIGHT ANTECUBITAL  Final   Special Requests   Final    BOTTLES DRAWN AEROBIC ONLY Blood Culture results may not be optimal due to an inadequate volume of blood received in culture bottles   Culture   Final    NO GROWTH 5 DAYS Performed at [facility], [place] [address], [place], [place]  [id number]    Report Status 03/22/2019 FINAL  Final  SARS Coronavirus 2 [facility] order, Performed in [facility] hospital lab) Nasopharyngeal Nasopharyngeal Swab     Status: None   Collection Time: 03/17/19  7:37 PM   Specimen: Nasopharyngeal Swab  Result Value Ref Range Status   SARS Coronavirus 2 NEGATIVE NEGATIVE Final    Comment: (NOTE) If result is NEGATIVE SARS-CoV-2 target nucleic acids are NOT DETECTED. The SARS-CoV-2 RNA is generally detectable in upper and lower  respiratory specimens during the acute phase of infection. The lowest  concentration of SARS-CoV-2 viral copies this assay can detect is 250  copies / mL. A negative result does not preclude SARS-CoV-2 infection  and should not be used as the sole basis for treatment or other  patient management decisions.  A negative result may occur with  improper specimen collection / handling, submission of specimen other  than nasopharyngeal swab, presence of viral mutation(s) within the  areas targeted by this  assay, and inadequate number of viral copies  (<250 copies / mL). A negative result must be combined with clinical  observations, patient history, and epidemiological information. If result is POSITIVE SARS-CoV-2 target nucleic acids are DETECTED. The SARS-CoV-2 RNA is generally detectable in upper and lower  respiratory specimens dur ing the acute phase of infection.  Positive  results are indicative of active infection with SARS-CoV-2.  Clinical  correlation with patient history and other diagnostic information is  necessary to determine patient infection status.  Positive results do  not rule out bacterial infection or co-infection with other viruses. If result is PRESUMPTIVE POSTIVE SARS-CoV-2 nucleic acids MAY BE PRESENT.   A presumptive positive result was obtained on the submitted specimen  and confirmed on repeat testing.  While 2019 novel coronavirus  (SARS-CoV-2) nucleic acids may be present in the submitted sample  additional confirmatory testing may be necessary for epidemiological  and / or clinical management purposes  to differentiate between  SARS-CoV-2 and other Sarbecovirus currently known to infect humans.  If clinically indicated additional testing with an alternate test  methodology [phone]) is advised. The SARS-CoV-2 RNA is generally  detectable in upper and lower respiratory sp ecimens during the acute  phase of infection. The expected result is Negative. Fact Sheet for Patients:  StrictlyIdeas.no Fact Sheet for Healthcare Providers: BankingDealers.co.za This test is not yet approved or cleared by the [place] FDA and has been authorized for detection and/or diagnosis of SARS-CoV-2 by FDA under an Emergency Use Authorization (EUA).  This EUA will remain in effect (meaning this test can be used) for the duration of the COVID-19 declaration under Section 564(b)(1) of the Act, 21 U.S.C. section 360bbb-3(b)(1),  unless the authorization is terminated or revoked sooner. Performed at [facility], [place] [address], [place], [place] [address]   MRSA PCR Screening     Status: None   Collection Time: 03/17/19 11:28 PM   Specimen: Nasopharyngeal  Result Value Ref Range Status   MRSA by PCR NEGATIVE NEGATIVE Final    Comment:        The GeneXpert MRSA Assay (FDA approved for NASAL specimens only), is one component of a comprehensive MRSA colonization surveillance program. It is not intended to diagnose MRSA infection nor to guide or monitor treatment for MRSA infections. Performed at [facility], [place] [address] [place], [place] [address]   Urine culture     Status: None   Collection Time: 03/19/19 12:19 PM   Specimen:  Urine, Random  Result Value Ref Range Status   Specimen Description URINE, RANDOM  Final   Special Requests NONE  Final   Culture   Final    NO GROWTH Performed at [facility], [address] [place], [place] [address]    Report Status 03/20/2019 FINAL  Final  Culture, blood (Routine X 2) w Reflex to ID Panel     Status: None   Collection Time: 03/21/19 10:01 AM   Specimen: BLOOD RIGHT WRIST  Result Value Ref Range Status   Specimen Description BLOOD RIGHT WRIST  Final   Special Requests   Final    BOTTLES DRAWN AEROBIC ONLY Blood Culture results may not be optimal due to an inadequate volume of blood received in culture bottles   Culture   Final    NO GROWTH 5 DAYS Performed at [facility], [place] [address] [place], [place] [address]    Report Status 03/26/2019 FINAL  Final  Culture, blood (Routine X 2) w Reflex to ID Panel     Status: None   Collection Time: 03/21/19 10:01 AM   Specimen: BLOOD RIGHT FOREARM  Result Value Ref Range Status   Specimen Description BLOOD RIGHT FOREARM  Final   Special Requests   Final    BOTTLES DRAWN AEROBIC ONLY Blood Culture results may not be optimal due to an inadequate volume of blood received in culture  bottles   Culture   Final    NO GROWTH 5 DAYS Performed at [facility], [place] [address], [place], [place] [address]    Report Status 03/26/2019 FINAL  Final  Culture, blood (Routine X 2) w Reflex to ID Panel     Status: None (Preliminary result)   Collection Time: 03/24/19  8:52 AM   Specimen: BLOOD  Result Value Ref Range Status   Specimen Description BLOOD RIGHT ANTECUBITAL  Final   Special Requests   Final    BOTTLES DRAWN AEROBIC AND ANAEROBIC Blood Culture adequate volume   Culture   Final    NO GROWTH 3 DAYS Performed at [facility], [place] [address] [place], [place] [address]    Report Status PENDING  Incomplete  Culture, blood (Routine X 2) w Reflex to ID Panel     Status: None (Preliminary result)   Collection Time: 03/24/19  8:56 AM   Specimen: BLOOD  Result Value Ref Range Status   Specimen Description BLOOD RIGHT ANTECUBITAL  Final   Special Requests   Final    BOTTLES DRAWN AEROBIC AND ANAEROBIC Blood Culture adequate volume   Culture   Final    NO GROWTH 3 DAYS Performed at [facility], [place] [address] [place], [place] [address]    Report Status PENDING  Incomplete  Novel Coronavirus, NAA (Hosp order, Send-out to Ref Lab; TAT 18-24 hrs     Status: None   Collection Time: 03/25/19  4:57 PM   Specimen: Nasopharyngeal Swab; Respiratory  Result Value Ref Range Status   SARS-CoV-2, NAA NOT DETECTED NOT DETECTED Final    Comment: (NOTE) This nucleic acid amplification test was developed and its performance characteristics determined by [employer]. Nucleic acid amplification tests include PCR and TMA. This test has not been FDA cleared or approved. This test has been authorized by FDA under an Emergency Use Authorization (EUA). This test is only authorized for the duration of time the declaration that circumstances exist justifying the authorization of the emergency use of  in vitro diagnostic tests for detection of SARS-CoV-2 virus  and/or diagnosis of COVID-19 infection under section 564(b)(1) of the Act, 21 U.S.C. [id number]) (1), unless the authorization is terminated or revoked sooner. When diagnostic testing is negative, the possibility of a false negative result should be considered in the context of a patient's recent exposures and the presence of clinical signs and symptoms consistent with COVID-19. An individual without symptoms of COVID- 19 and who is not shedding SARS-CoV-2 vi rus would expect to have a negative (not detected) result in this assay. Performed At: [facility] [address] [place], [place] [id number] Rush Farmer MD [id number]    [place]  Final    Comment: Performed at [facility], [place] [address] [place], [place] [address]     Time coordinating discharge: 34 minutes  SIGNED:   Hosie Poisson, MD  [facility] 03/27/2019, 12:30 PM Pager   If 7PM-7AM, please contact night-coverage [email or web address] Password [email or web address]

## 2019-03-27 NOTE — TOC Transition Note (Signed)
Transition of Care [facility]) - CM/SW Discharge Note   Patient Details  Name: Jane [name] MRN: [id number] Date of Birth: 03/01/1929  Transition of Care [facility]) CM/SW Contact:  Gelene Mink, [place] Phone Number: 03/27/2019, 2:02 PM   Clinical Narrative:     Patient will DC to: [facility] Anticipated DC date: 03/27/2019 Family notified: Yes Transport by: Corey Harold   Per MD patient ready for DC to . RN, patient, patient's family, and facility notified of DC. Discharge Summary and FL2 sent to facility. RN to call report prior to discharge [phone]). The patient will be going to room 301.  DC packet on chart. Ambulance transport requested for patient.   CSW will sign off for now as social work intervention is no longer needed. Please consult [place] again if new needs arise.  Yandiel Bergum, LCSW-A [place]/Clinical Social Work Department Cell: [phone]    Final next level of care: [place] Barriers to Discharge: No Barriers Identified   Patient Goals and CMS Choice Patient states their goals for this hospitalization and ongoing recovery are:: Pt will go to [facility] for rehab CMS Medicare.gov Compare Post Acute Care list provided to:: Other (Comment Required) Choice offered to / list presented to : (Pt nephew)  Discharge Placement   Existing PASRR number confirmed : 03/24/19          Patient chooses bed at: [facility] Patient to be transferred to facility by: [facility] Name of family member notified: [name] Patient and family notified of of transfer: 03/27/19  Discharge Plan and Services In-house Referral: Clinical Social Work Discharge Planning Services: [employer] Consult Post Acute Care Choice: [place]          DME Arranged: N/A DME Agency: NA       HH Arranged: NA [place] Agency: NA        Social Determinants of Health (SDOH) Interventions     Readmission Risk Interventions No flowsheet data found.

## 2019-03-27 NOTE — Progress Notes (Signed)
Attempted to call report to nurse taking patient at [facility] multiple times but unsuccessful. Number left with secretary to call back. Report given to [facility] and discharge instructions along with scripts placed in discharge packet. Patient and belongings taken by [facility] via stretcher for discharge.

## 2019-03-29 ENCOUNTER — Encounter: Payer: Self-pay | Admitting: Adult Health

## 2019-03-29 ENCOUNTER — Non-Acute Institutional Stay (SKILLED_NURSING_FACILITY): Payer: Medicare Other | Admitting: Adult Health

## 2019-03-29 DIAGNOSIS — J069 Acute upper respiratory infection, unspecified: Secondary | ICD-10-CM | POA: Diagnosis not present

## 2019-03-29 DIAGNOSIS — N179 Acute kidney failure, unspecified: Secondary | ICD-10-CM | POA: Diagnosis not present

## 2019-03-29 DIAGNOSIS — D696 Thrombocytopenia, unspecified: Secondary | ICD-10-CM | POA: Diagnosis not present

## 2019-03-29 DIAGNOSIS — R7881 Bacteremia: Secondary | ICD-10-CM | POA: Diagnosis not present

## 2019-03-29 DIAGNOSIS — E876 Hypokalemia: Secondary | ICD-10-CM

## 2019-03-29 DIAGNOSIS — I6300 Cerebral infarction due to thrombosis of unspecified precerebral artery: Secondary | ICD-10-CM

## 2019-03-29 DIAGNOSIS — E7849 Other hyperlipidemia: Secondary | ICD-10-CM

## 2019-03-29 DIAGNOSIS — B9620 Unspecified Escherichia coli [E. coli] as the cause of diseases classified elsewhere: Secondary | ICD-10-CM | POA: Insufficient documentation

## 2019-03-29 DIAGNOSIS — I10 Essential (primary) hypertension: Secondary | ICD-10-CM

## 2019-03-29 DIAGNOSIS — I5032 Chronic diastolic (congestive) heart failure: Secondary | ICD-10-CM

## 2019-03-29 DIAGNOSIS — N183 Chronic kidney disease, stage 3 (moderate): Secondary | ICD-10-CM | POA: Diagnosis not present

## 2019-03-29 LAB — CULTURE, BLOOD (ROUTINE X 2)
Culture: NO GROWTH
Culture: NO GROWTH
Special Requests: ADEQUATE
Special Requests: ADEQUATE

## 2019-03-29 NOTE — Progress Notes (Signed)
Location:  [place] Room Number: 301-B Place of Service:  SNF (31) Provider:  Durenda Age, DNP, FNP-BC  Patient Care Team: Ina Homes, MD as PCP - General (Internal Medicine)  Extended Emergency Contact Information Primary Emergency Contact: [name] Mobile Phone: [phone] Relation: Nephew Secondary Emergency Contact: Gilberto Better Mobile Phone: [phone] Relation: Nephew Interpreter needed? No  Code Status:  DNR  Goals of care: Advanced Directive information Advanced Directives 10/20/2018  Does Patient Have a Medical Advance Directive? No  Type of Advance Directive -  Does patient want to make changes to medical advance directive? -  Copy of [place] in Chart? -  Would patient like information on creating a medical advance directive? No - Patient declined  Pre-existing out of facility DNR order (yellow form or pink MOST form) -     Chief Complaint  Patient presents with   [facility] followup, status post hospitalization 9/16-9/26/20 at [facility] for stroke.    HPI:  Pt is a 85 y.o. female seen today for hospital followup.  She was admitted to [place] on 03/27/19 following hospitalization at [facility] 9/16-9/26/20 due to stroke. She has a PMH of CHF, CKD stage 3, HLD, HTN, aortic insufficiency, and CAD. She was seen in her room today. She was sleeping and woke up up to verbal greetings. She can barely open her eyes and verbalized that she can see me. She is hard of hearing.  She was found down by her maintenance man and was reported to be last seen normal 5 days prior. Head CT showed significant intraventricular extension of a small periventricular hemorrhage. She was severely hypertensive upon ED arrival (180/120). Head CT showed significant intraventricular extension of a small periventricular hemorrhage. Her Imdur was increased to 60 mg and to continue Amlodipine,  Bisoprolol, Lasix and Cozaar. She had thrombocytopenia, platelet dipped to 60,  that improved after discontinuing heparin injection, 121. Blood cultures from 9/16 showed E. Coli. She was put on Rocephin from 9/19 to 9/22 then shifted to Ancef on 9/22 to 9/26.    Past Medical History:  Diagnosis Date   Anemia    Aortic insufficiency    mild   Arthritis    "arms" (08/09/2015)   CAD (coronary artery disease)    cath 08/09/2014 95% stenosis in prox to mid RCA s/p DES, 80-90% prox OM2, 50% distal LAD   CHF (congestive heart failure) (HCC)    CKD (chronic kidney disease) stage 3, GFR 30-59 ml/min (HCC)    Colon polyp    2009 colonoscopy, not retrieved for pathology   Dyspnea    Ectropion of left lower eyelid    GERD (gastroesophageal reflux disease) 2009   EGD with benign gastric polyp too   Hyperlipidemia    Hypertension    Pneumonia    history of   Stroke ([place]) 1975   Vitamin D deficiency    Past Surgical History:  Procedure Laterality Date   ABDOMINAL HYSTERECTOMY     APPENDECTOMY     ARTERY BIOPSY Left 12/16/2012   Procedure: BIOPSY TEMPORAL ARTERY;  Surgeon: Mal Misty, MD;  Location: [place];  Service: Vascular;  Laterality: Left;   CARDIAC CATHETERIZATION     LEFT HEART CATHETERIZATION WITH CORONARY ANGIOGRAM N/A 08/09/2014   Procedure: LEFT HEART CATHETERIZATION WITH CORONARY ANGIOGRAM;  Surgeon: Peter M Martinique, MD;  Location: [facility] CATH LAB;  Service: Cardiovascular;  Laterality: N/A;   PERCUTANEOUS CORONARY STENT INTERVENTION (  PCI-S)  08/09/2014   Procedure: PERCUTANEOUS CORONARY STENT INTERVENTION (PCI-S);  Surgeon: Peter M Martinique, MD;  Location: [facility] CATH LAB;  Service: Cardiovascular;;   TONSILLECTOMY      No Known Allergies  Outpatient Encounter Medications as of 03/29/2019  Medication Sig   acetaminophen (TYLENOL) 325 MG tablet Take 325-650 mg by mouth every 6 (six) hours as needed for mild pain or headache.    albuterol (PROVENTIL HFA;VENTOLIN HFA)  108 (90 Base) MCG/ACT inhaler Inhale 1-2 puffs into the lungs every 6 (six) hours as needed for wheezing or shortness of breath.   Amino Acids-Protein Hydrolys (FEEDING SUPPLEMENT, PRO-STAT SUGAR FREE 64,) LIQD Place 30 mLs into feeding tube 2 (two) times daily.   amLODipine (NORVASC) 10 MG tablet Take 1 tablet (10 mg total) by mouth daily.   atorvastatin (LIPITOR) 40 MG tablet Take 1 tablet (40 mg total) by mouth daily at 6 PM.   bisoprolol (ZEBETA) 10 MG tablet Place 1 tablet (10 mg total) into feeding tube 2 (two) times daily.   feeding supplement, ENSURE ENLIVE, (ENSURE ENLIVE) LIQD Take 237 mLs by mouth 2 (two) times daily between meals.   furosemide (LASIX) 20 MG tablet TAKE 1 TABLET(20 MG) BY MOUTH DAILY   isosorbide mononitrate (IMDUR) 60 MG 24 hr tablet Take 1 tablet (60 mg total) by mouth daily.   losartan (COZAAR) 100 MG tablet TAKE 1 TABLET(100 MG) BY MOUTH DAILY   magnesium hydroxide (MILK OF MAGNESIA) 400 MG/5ML suspension Take 30 mLs by mouth daily as needed for mild constipation.    pantoprazole sodium (PROTONIX) 40 mg/20 mL PACK Take 20 mLs (40 mg total) by mouth daily.   polyvinyl alcohol (ARTIFICIAL TEARS) 1.4 % ophthalmic solution Place 1 drop into both eyes 2 (two) times daily. For dry eyes   senna (SENOKOT) 8.6 MG TABS tablet Take 2 tablets by mouth at bedtime.   [DISCONTINUED] senna-docusate (SENNA PLUS) 8.6-50 MG tablet Take 2 tablets by mouth at bedtime. SENNA PLUS TABLET TAKE 2 TABLETS BY MOUTH AT BEDTIME FOR CONSTIPATION   No facility-administered encounter medications on file as of 03/29/2019.     Review of Systems Unable to obtain due to apraxia    Immunization History  Administered Date(s) Administered   Influenza Whole 03/28/2008   Influenza,inj,Quad PF,6+ Mos 08/09/2013, 03/30/2015, 03/24/2017   Pneumococcal Conjugate-13 12/08/2014   Pneumococcal Polysaccharide-23 09/18/2012   Tdap 02/01/2016, 06/21/2018   Pertinent  Health Maintenance  Due  Topic Date Due   INFLUENZA VACCINE  01/30/2019   DEXA SCAN  Completed   PNA vac Low Risk Adult  Completed   Fall Risk  03/19/2018 10/16/2017 09/26/2017 07/14/2017 04/21/2017  Falls in the past year? No Yes No No No  Number falls in past yr: - 1 - - -  Injury with Fall? - Yes - - -  Risk Factor Category  - High Fall Risk - - -  Risk for fall due to : - Impaired mobility;Impaired balance/gait Impaired balance/gait;Impaired mobility - -  Follow up - Falls prevention discussed - - -  Comment - - - - -     Vitals:   03/29/19 1415 03/29/19 1416  BP: (!) 148/68 (!) 150/80  Pulse: 78   Resp: 20   Temp: 98.7 F (37.1 C)   TempSrc: Oral   SpO2: 95%   Weight: 159 lb 9.8 oz (72.4 kg)   Height: 4\' 10"  (1.473 m)    Body mass index is 33.36 kg/m.  Physical Exam  GENERAL APPEARANCE:  Well nourished. In no acute distress. Obese SKIN:  Skin is warm and dry.  MOUTH and THROAT: Lips are without lesions. Oral mucosa is moist and without lesions.  RESPIRATORY: Breathing is even & unlabored, BS CTAB CARDIAC: RRR, no murmur,no extra heart sounds, no edema GI: Abdomen soft, normal BS, no masses, no tenderness EXTREMITIES:  Able to move BUE. Did not move BLE NEUROLOGICAL: There is no tremor. Apraxic. Able to move BUE. No movement on BLE. Lethargic. No gaze preference. PSYCHIATRIC:  Affect and behavior are appropriate    Labs reviewed: Recent Labs    10/20/18 0055 10/20/18 0433  03/17/19 1922  03/23/19 0117 03/24/19 0512 03/25/19 0346  NA 143 144   < >  --    < > 139 133* 132*  K 3.4* 3.7   < >  --    < > 3.6 4.2 4.1  CL 110 111   < >  --    < > 107 101 97*  CO2 22 21*   < >  --    < > 24 20* 24  GLUCOSE 92 97   < >  --    < > 123* 118* 131*  BUN 17 15   < >  --    < > 31* 38* 42*  CREATININE 1.28* 1.16*   < >  --    < > 1.03* 0.99 0.99  CALCIUM 8.0* 8.5*   < >  --    < > 7.6* 7.9* 8.1*  MG 1.5* 2.0  --  1.7  --   --   --   --    < > = values in this interval not displayed.    Recent Labs    07/26/18 0431 10/20/18 0055 03/17/19 1811  AST 20 19 34  ALT 11 10 17   ALKPHOS 65 64 60  BILITOT 1.3* 0.9 2.4*  PROT 5.7* 6.4* 6.7  ALBUMIN 3.0* 3.6 3.5   Recent Labs    08/04/18 10/20/18 0055 03/17/19 1811  03/23/19 0117 03/24/19 0512 03/25/19 0346  WBC 3.2 3.4* 8.4   < > 6.0 5.3 6.1  NEUTROABS 2 1.8 7.3  --   --   --   --   HGB 10.0* 9.9* 13.3   < > 9.7* 10.5* 10.1*  HCT 29* 31.2* 40.0   < > 27.9* 30.9* 30.1*  MCV  --  96.9 98.5   < > 97.2 98.4 97.4  PLT 148* 118* 122*   < > 90* 80* 121*   < > = values in this interval not displayed.   Lab Results  Component Value Date   TSH 0.923 05/31/2014   Lab Results  Component Value Date   HGBA1C 5.1 03/24/2019   Lab Results  Component Value Date   CHOL 185 03/19/2019   HDL 43 03/19/2019   LDLCALC 113 (H) 03/19/2019   TRIG 146 03/19/2019   CHOLHDL 4.3 03/19/2019    Significant Diagnostic Results in last 30 days:  Ct Angio Head W Or Wo Contrast  Result Date: 03/18/2019 CLINICAL DATA:  Follow-up intracranial hemorrhage EXAM: CT ANGIOGRAPHY HEAD AND NECK TECHNIQUE: Multidetector CT imaging of the head and neck was performed using the standard protocol during bolus administration of intravenous contrast. Multiplanar CT image reconstructions and MIPs were obtained to evaluate the vascular anatomy. Carotid stenosis measurements (when applicable) are obtained utilizing NASCET criteria, using the distal internal carotid diameter as the denominator. CONTRAST:  9mL OMNIPAQUE IOHEXOL 350 MG/ML SOLN COMPARISON:  Head CT yesterday. FINDINGS: CTA NECK FINDINGS Aortic arch: Pronounced aortic atherosclerosis. Branching pattern is normal without origin stenosis. Right carotid system: Common carotid artery widely patent to the bifurcation. Calcified plaque at the carotid bifurcation and ICA bulb. Minimal diameter of the proximal ICA measures 2 mm. Compared to a more distal cervical ICA diameter of 5 mm, this indicates a 60%  stenosis. Cervical ICA widely patent beyond that. Left carotid system: Common carotid artery is patent to the bifurcation region. Extensive calcified plaque at the carotid bifurcation and ICA bulb. Minimal diameter on this side is also 2 mm. Compared to a more distal cervical ICA diameter of 5 mm, this indicates a 60% stenosis. Vertebral arteries: Left vertebral artery is dominant. Both vertebral artery origins show calcified plaque with estimated stenosis of 30-50%. Beyond that, both vertebral arteries are patent through the cervical region to the foramen magnum. Skeleton: Chronic degenerative spondylosis. Old partial compression fractures at T3 and T4. Other neck: No mass or lymphadenopathy.  Mild thyroid goiter. Upper chest: Negative Review of the MIP images confirms the above findings CTA HEAD FINDINGS Anterior circulation: Both internal carotid arteries are patent through the skull base and siphon regions. There is siphon atherosclerotic calcification but without stenosis greater than 50%. Anterior and middle cerebral vessels are patent without proximal stenosis, aneurysm vascular malformation. In the region of the left deep brain hemorrhage, no abnormal vascular structure is identified. Posterior circulation: Atherosclerotic disease of both vertebral artery V4 segments. Stenosis on the right is severe, 70% or greater. Stenosis on the left is moderate, 50-70%. Both vessels reach the basilar. No basilar stenosis is seen. Posterior circulation branch vessels are patent. Venous sinuses: Patent and normal. Anatomic variants: None significant. Review of the MIP images confirms the above findings IMPRESSION: No evidence of underlying vascular lesion in the region of the deep brain hemorrhage on the left. Advanced aortic atherosclerosis. Atherosclerotic disease at both carotid bifurcations with maximal stenosis of 60% on each side. 30-50% stenosis at both vertebral artery origins. 50% or less stenosis in both carotid  siphon regions. 70% or greater stenosis of the right vertebral artery V4 segment. 50-70% stenosis of the left vertebral artery V4 segment. Electronically Signed   By: Nelson Chimes M.D.   On: 03/18/2019 10:29   Ct Head Wo Contrast  Result Date: 03/21/2019 CLINICAL DATA:  Intracranial hemorrhage follow up EXAM: CT HEAD WITHOUT CONTRAST TECHNIQUE: Contiguous axial images were obtained from the base of the skull through the vertex without intravenous contrast. COMPARISON:  03/17/2019 FINDINGS: Brain: There is subarachnoid blood over the posterior right convexity, unchanged from the MRI. The lateral ventricles are slightly decompressed relative to the prior study. There is no midline shift. There is no new site of hemorrhage. There is periventricular hypoattenuation compatible with chronic microvascular disease. Vascular: Atherosclerotic calcification of the vertebral and internal carotid arteries at the skull base. No abnormal hyperdensity of the major intracranial arteries or dural venous sinuses. Skull: Large scalp vertex hematoma Sinuses/Orbits: No fluid levels or advanced mucosal thickening of the visualized paranasal sinuses. No mastoid or middle ear effusion. The orbits are normal. IMPRESSION: 1. Unchanged appearance of large volume intraventricular and small volume extra-axial subarachnoid hemorrhage. No new hemorrhagic site. 2. Slight decrease in size of the lateral ventricles compared to the prior study. 3. Large scalp vertex hematoma. Electronically Signed   By: Ulyses Jarred M.D.   On: 03/21/2019 03:34   Ct Head Wo Contrast  Addendum Date: 03/17/2019   ADDENDUM REPORT: 03/17/2019 22:19  ADDENDUM: Cervical spine: Comparison 10/16/2018 Alignment: There is loss of cervical lordosis. This may be secondary to splinting, soft tissue injury, or positioning. There is stable anterolisthesis of C2 on C3, C3 on C4, and C4 on C5. Skull base and vertebrae: No acute fracture. Disc spaces: Significant degenerative  changes are identified throughout the cervical spine. Soft tissues and spinal canal: No acute abnormality. UPPER chest: No acute abnormality. Other: Carotid calcifications bilaterally. IMPRESSION: Significant stable degenerative changes throughout the cervical spine. No evidence for acute abnormality. Electronically Signed   By: Nolon Nations M.D.   On: 03/17/2019 22:19   Result Date: 03/17/2019 CLINICAL DATA:  Pt found by the maintenance man. Last seen by family on Monday. Pt altered level of consciousness. Hypertension and strong smell of amnionia EXAM: CT HEAD WITHOUT CONTRAST; CT CERVICAL SPINE WITHOUT CONTRAST TECHNIQUE: Contiguous axial images were obtained from the base of the skull through the vertex without intravenous contrast. COMPARISON:  07/01/2018 FINDINGS: Brain: Significant blood identified within the LATERAL ventricles, LEFT greater than RIGHT. Blood is also identified in the third and fourth ventricles, layering dependently. There is parenchymal hemorrhage within the frontal lobe periventricular white matter, adjacent to the body of the LEFT LATERAL ventricle and contiguous with the intraventricular blood. This focus of hemorrhage is estimated to measure 9 x 9 millimeters. There is central and cortical atrophy. Periventricular white matter changes are consistent with small vessel disease. Vascular: There is atherosclerotic calcification of the internal carotid arteries. No hyperdense vessels. Skull: Normal. Negative for fracture or focal lesion. Sinuses/Orbits: No acute finding. Other: 11 significant scalp1 edema and hematoma along the vertex, not associated with underlying fracture. IMPRESSION: 1. Large intraventricular hemorrhage with ventricular dilatation. 2. 9 millimeter intraparenchymal hemorrhage within the frontal lobe periventricular white matter, adjacent to the body of the LEFT LATERAL ventricle. 3. Atrophy and small vessel disease. 4. Significant scalp edema and hematoma along the  vertex, not associated with underlying fracture. Critical Value/emergent results were called by telephone at the time of interpretation on 03/17/2019 at 7:00 pm to [facility] , who verbally acknowledged these results. Electronically Signed: By: Nolon Nations M.D. On: 03/17/2019 19:06   Ct Angio Neck W Or Wo Contrast  Result Date: 03/18/2019 CLINICAL DATA:  Follow-up intracranial hemorrhage EXAM: CT ANGIOGRAPHY HEAD AND NECK TECHNIQUE: Multidetector CT imaging of the head and neck was performed using the standard protocol during bolus administration of intravenous contrast. Multiplanar CT image reconstructions and MIPs were obtained to evaluate the vascular anatomy. Carotid stenosis measurements (when applicable) are obtained utilizing NASCET criteria, using the distal internal carotid diameter as the denominator. CONTRAST:  29mL OMNIPAQUE IOHEXOL 350 MG/ML SOLN COMPARISON:  Head CT yesterday. FINDINGS: CTA NECK FINDINGS Aortic arch: Pronounced aortic atherosclerosis. Branching pattern is normal without origin stenosis. Right carotid system: Common carotid artery widely patent to the bifurcation. Calcified plaque at the carotid bifurcation and ICA bulb. Minimal diameter of the proximal ICA measures 2 mm. Compared to a more distal cervical ICA diameter of 5 mm, this indicates a 60% stenosis. Cervical ICA widely patent beyond that. Left carotid system: Common carotid artery is patent to the bifurcation region. Extensive calcified plaque at the carotid bifurcation and ICA bulb. Minimal diameter on this side is also 2 mm. Compared to a more distal cervical ICA diameter of 5 mm, this indicates a 60% stenosis. Vertebral arteries: Left vertebral artery is dominant. Both vertebral artery origins show calcified plaque with estimated stenosis of 30-50%. Beyond that, both vertebral arteries are patent  through the cervical region to the foramen magnum. Skeleton: Chronic degenerative spondylosis. Old partial  compression fractures at T3 and T4. Other neck: No mass or lymphadenopathy.  Mild thyroid goiter. Upper chest: Negative Review of the MIP images confirms the above findings CTA HEAD FINDINGS Anterior circulation: Both internal carotid arteries are patent through the skull base and siphon regions. There is siphon atherosclerotic calcification but without stenosis greater than 50%. Anterior and middle cerebral vessels are patent without proximal stenosis, aneurysm vascular malformation. In the region of the left deep brain hemorrhage, no abnormal vascular structure is identified. Posterior circulation: Atherosclerotic disease of both vertebral artery V4 segments. Stenosis on the right is severe, 70% or greater. Stenosis on the left is moderate, 50-70%. Both vessels reach the basilar. No basilar stenosis is seen. Posterior circulation branch vessels are patent. Venous sinuses: Patent and normal. Anatomic variants: None significant. Review of the MIP images confirms the above findings IMPRESSION: No evidence of underlying vascular lesion in the region of the deep brain hemorrhage on the left. Advanced aortic atherosclerosis. Atherosclerotic disease at both carotid bifurcations with maximal stenosis of 60% on each side. 30-50% stenosis at both vertebral artery origins. 50% or less stenosis in both carotid siphon regions. 70% or greater stenosis of the right vertebral artery V4 segment. 50-70% stenosis of the left vertebral artery V4 segment. Electronically Signed   By: Nelson Chimes M.D.   On: 03/18/2019 10:29   Ct Cervical Spine Wo Contrast  Addendum Date: 03/17/2019   ADDENDUM REPORT: 03/17/2019 22:19 ADDENDUM: Cervical spine: Comparison 10/16/2018 Alignment: There is loss of cervical lordosis. This may be secondary to splinting, soft tissue injury, or positioning. There is stable anterolisthesis of C2 on C3, C3 on C4, and C4 on C5. Skull base and vertebrae: No acute fracture. Disc spaces: Significant degenerative  changes are identified throughout the cervical spine. Soft tissues and spinal canal: No acute abnormality. UPPER chest: No acute abnormality. Other: Carotid calcifications bilaterally. IMPRESSION: Significant stable degenerative changes throughout the cervical spine. No evidence for acute abnormality. Electronically Signed   By: Nolon Nations M.D.   On: 03/17/2019 22:19   Result Date: 03/17/2019 CLINICAL DATA:  Pt found by the maintenance man. Last seen by family on Monday. Pt altered level of consciousness. Hypertension and strong smell of amnionia EXAM: CT HEAD WITHOUT CONTRAST; CT CERVICAL SPINE WITHOUT CONTRAST TECHNIQUE: Contiguous axial images were obtained from the base of the skull through the vertex without intravenous contrast. COMPARISON:  07/01/2018 FINDINGS: Brain: Significant blood identified within the LATERAL ventricles, LEFT greater than RIGHT. Blood is also identified in the third and fourth ventricles, layering dependently. There is parenchymal hemorrhage within the frontal lobe periventricular white matter, adjacent to the body of the LEFT LATERAL ventricle and contiguous with the intraventricular blood. This focus of hemorrhage is estimated to measure 9 x 9 millimeters. There is central and cortical atrophy. Periventricular white matter changes are consistent with small vessel disease. Vascular: There is atherosclerotic calcification of the internal carotid arteries. No hyperdense vessels. Skull: Normal. Negative for fracture or focal lesion. Sinuses/Orbits: No acute finding. Other: 11 significant scalp1 edema and hematoma along the vertex, not associated with underlying fracture. IMPRESSION: 1. Large intraventricular hemorrhage with ventricular dilatation. 2. 9 millimeter intraparenchymal hemorrhage within the frontal lobe periventricular white matter, adjacent to the body of the LEFT LATERAL ventricle. 3. Atrophy and small vessel disease. 4. Significant scalp edema and hematoma along the  vertex, not associated with underlying fracture. Critical Value/emergent results were called  by telephone at the time of interpretation on 03/17/2019 at 7:00 pm to [facility] , who verbally acknowledged these results. Electronically Signed: By: Nolon Nations M.D. On: 03/17/2019 19:06   Mr Brain Wo Contrast  Result Date: 03/19/2019 CLINICAL DATA:  85 year old female with acute intracranial hemorrhage. EXAM: MRI HEAD WITHOUT CONTRAST TECHNIQUE: Multiplanar, multiecho pulse sequences of the brain and surrounding structures were obtained without intravenous contrast. COMPARISON:  CT head and CTA head and neck recently. Brain MRI 02/04/2016. FINDINGS: Brain: Stable large volume intraventricular hemorrhage from the recent CTs. A small volume of superimposed subarachnoid hemorrhage layering posteriorly has increased (series 11, image 15), especially in the posterior fossa along the cerebellar convexities (series 11, image 9). No definite subdural space hematoma. As suspected previously, there does appear to be a tiny intra-axial focus of blood in the left periventricular white matter on series 14, image 14. Stable ventricle size and configuration. Up to mild transependymal edema. Basilar cisterns remain patent. Trace rightward midline shift. No superimposed restricted diffusion to suggest acute infarct. There is subependymal susceptibility on DWI. Chronic Patchy and confluent cerebral white matter T2 and FLAIR hyperintensity has increased since 2017. The scattered subarachnoid blood limits the evaluation for underlying chronic microhemorrhage today. Negative pituitary.  Stable cervicomedullary junction. Vascular: Major intracranial vascular flow voids are stable since 2017. Dominant left vertebral artery. Skull and upper cervical spine: Advanced cervical spine degeneration with spinal stenosis beginning at the C2 level, may have progressed since 2017. Bone marrow signal is within normal limits.  Sinuses/Orbits: Negative orbits and paranasal sinuses. Other: Lobulated vertex scalp hematoma on series 9, image 12. Mastoids are well pneumatized. IMPRESSION: 1. Stable large volume of intraventricular hemorrhage, although superimposed subarachnoid hemorrhage volume appears increased from the CT on 03/17/2019 suggesting a degree of continued bleeding. 2. Stable ventriculomegaly, with mild transependymal edema. No significant midline shift. Basilar cisterns remain patent. 3. As before, the hemorrhage might have originated in the left periventricular white matter. No superimposed acute infarct. No underlying lesion is identified in the absence of IV contrast. 4. Scalp hematoma at the vertex re-identified. Electronically Signed   By: Genevie Ann M.D.   On: 03/19/2019 00:35   Dg Pelvis Portable  Result Date: 03/17/2019 CLINICAL DATA:  Recent fall with pelvic pain, initial encounter EXAM: PORTABLE PELVIS 1-2 VIEWS COMPARISON:  None. FINDINGS: Pelvic ring is intact. Degenerative changes of the lumbar spine and hip joints are seen. Diffuse vascular calcifications are noted. No acute bony abnormality is noted. IMPRESSION: No acute abnormality noted. Electronically Signed   By: Inez Catalina M.D.   On: 03/17/2019 19:09   Dg Chest Port 1 View  Result Date: 03/26/2019 CLINICAL DATA:  Follow-up. Admitted patient with intracranial hemorrhage. EXAM: PORTABLE CHEST 1 VIEW COMPARISON:  Radiograph 03/22/2019, 03/20/2019 FINDINGS: Enteric tube is been removed. Cardiomegaly is unchanged. Improved vascular congestion from prior exam. Retrocardiac atelectasis. Atherosclerosis of the thoracic aorta. No large pleural effusion. No pneumothorax. Degenerative change in the shoulders. IMPRESSION: 1. Unchanged cardiomegaly. Improved vascular congestion from prior exam. 2. Minor retrocardiac atelectasis. 3.  Aortic Atherosclerosis (ICD10-I70.0). Electronically Signed   By: Keith Rake M.D.   On: 03/26/2019 19:31   Dg Chest Port 1  View  Result Date: 03/22/2019 CLINICAL DATA:  Shortness of breath EXAM: PORTABLE CHEST 1 VIEW COMPARISON:  03/20/2019 FINDINGS: Cardiomegaly. Mild vascular congestion. No confluent opacities, effusions or overt edema. No acute bony abnormality. IMPRESSION: Mild cardiomegaly.  Vascular congestion. Electronically Signed   By: Rolm Baptise M.D.  On: 03/22/2019 19:01   Dg Chest Port 1 View  Result Date: 03/20/2019 CLINICAL DATA:  Shortness of breath. EXAM: PORTABLE CHEST 1 VIEW COMPARISON:  Chest x-ray dated March 17, 2019. FINDINGS: The feeding tube entering the stomach with the tip below the field of view. Unchanged cardiomegaly. Atherosclerotic calcification of the aortic arch. Largely resolved interstitial edema and pulmonary vascular congestion. No focal consolidation, pleural effusion, or pneumothorax. No acute osseous abnormality. IMPRESSION: 1. Largely resolved vascular congestion and interstitial edema. Electronically Signed   By: Titus Dubin M.D.   On: 03/20/2019 11:57   Dg Chest Port 1 View  Result Date: 03/17/2019 CLINICAL DATA:  Altered level of consciousness EXAM: PORTABLE CHEST 1 VIEW COMPARISON:  Radiograph 10/20/2018, CT 09/17/2016 FINDINGS: Cardiomegaly and central venous congestion, similar to prior. Suspect mild interstitial edema as well with hazy interstitial opacities and indistinct cephalized vascularity. No focal consolidative opacity. No pneumothorax or effusion. The aorta is calcified. The remaining cardiomediastinal contours are unremarkable. Degenerative changes are present in the spine and shoulders including a high-riding appearance of the right humeral head likely reflecting chronic rotator cuff insufficiency. IMPRESSION: Cardiomegaly with mild interstitial edema. Electronically Signed   By: Lovena Le M.D.   On: 03/17/2019 19:08    Assessment/Plan  1. Cerebrovascular accident (CVA) due to thrombosis of precerebral artery ([place]) - Head CT showed significant  intraventricular extension of a small periventricular hemorrhage. Head CT showed significant intraventricular extension of a small periventricular hemorrhage, follow-up with neurology in 1 month, for PT and OT for therapeutic strengthening exercises  2. Essential hypertension - was severely hypertensive upon ED arrival (180/120), increased to 60 mg and to continue Amlodipine, Bisoprolol,Lasix and Cozar  3. Chronic heart failure with preserved ejection fraction (HCC) - continue Lasix and Cozar   4. Other hyperlipidemia Lab Results  Component Value Date   CHOL 185 03/19/2019   HDL 43 03/19/2019   LDLCALC 113 (H) 03/19/2019   TRIG 146 03/19/2019   CHOLHDL 4.3 03/19/2019  - continue Atorvastatin   5. Acute renal failure superimposed on stage 3 chronic kidney disease, unspecified acute renal failure type [facility]) Lab Results  Component Value Date   CREATININE 0.99 03/25/2019  - creatinine back to baseline, will re-check BMP in 1 week  6. Thrombocytopenia ([place]) Lab Results  Component Value Date   PLT 121 (L) 03/25/2019  - platelet dipped to 60, improved after discontinuing heparin injection, will re-check cbc in 1 week  7. Hypokalemia Lab Results  Component Value Date   K 4.1 03/25/2019  - repleted, will monitor  8. E coli bacteremia - Blood cultures from 9/16 showed E. Coli. She was put on Rocephin from 9/19 to 9/22 then shifted to Ancef on 9/22 to 9/26.      Family/ staff Communication:  Discussed plan of care with resident and charge nurse.  Labs/tests ordered: CBC and BMP in 1 week  Goals of care:   Short-term rehabilitation.   Durenda Age, DNP, FNP-BC [facility] [phone] (Monday-Friday 8:00 a.m. - 5:00 p.m.) [phone] (after hours)

## 2019-03-30 ENCOUNTER — Encounter: Payer: Self-pay | Admitting: Internal Medicine

## 2019-03-30 ENCOUNTER — Non-Acute Institutional Stay (SKILLED_NURSING_FACILITY): Payer: Medicare Other | Admitting: Internal Medicine

## 2019-03-30 DIAGNOSIS — R7881 Bacteremia: Secondary | ICD-10-CM | POA: Diagnosis not present

## 2019-03-30 DIAGNOSIS — I615 Nontraumatic intracerebral hemorrhage, intraventricular: Secondary | ICD-10-CM

## 2019-03-30 DIAGNOSIS — D696 Thrombocytopenia, unspecified: Secondary | ICD-10-CM | POA: Diagnosis not present

## 2019-03-30 DIAGNOSIS — I10 Essential (primary) hypertension: Secondary | ICD-10-CM | POA: Diagnosis not present

## 2019-03-30 DIAGNOSIS — N179 Acute kidney failure, unspecified: Secondary | ICD-10-CM

## 2019-03-30 DIAGNOSIS — N183 Chronic kidney disease, stage 3 (moderate): Secondary | ICD-10-CM

## 2019-03-30 NOTE — Assessment & Plan Note (Addendum)
Improved with discontinuation of heparin. Low dose ASA also D/Ced due to IVH.

## 2019-03-30 NOTE — Assessment & Plan Note (Signed)
BP controlled; no change in antihypertensive medications  

## 2019-03-30 NOTE — Progress Notes (Signed)
NURSING HOME LOCATION:  [facility] ROOM NUMBER:  [id number]  CODE STATUS:  DNR  PCP:  Ina Homes, MD  [place] [address]  This is a comprehensive admission note to [facility] performed on this date less than 30 days from date of admission. Included are preadmission medical/surgical history; reconciled medication list; family history; social history and comprehensive review of systems.  Corrections and additions to the records were documented. Comprehensive physical exam was also performed. Additionally a clinical summary was entered for each active diagnosis pertinent to this admission in the Problem List to enhance continuity of care.  HPI: The patient was hospitalized 9/16-9/26/2020 for a stroke presenting as intraventricular hemorrhage with cytotoxic brain edema.  The left frontal periventricular hemorrhage was attributed to uncontrolled hypertension.  Brain MRI revealed stable IVH with increased SAD without midline shift or infarct.  Echocardiogram revealed no cardiac source of embolus.  The patient has prior history of stroke in 1975. Acute on stage III CKD was present on admission but creatinine returned to baseline by the time of discharge. Thrombocytopenia improved after heparin injections were discontinued.Low-dose enteric-coated aspirin was discontinued. Stroke was complicated by dysphagia necessitating a dysphagia 1 diet with thin liquids. Hospital course was complicated by pulmonary edema; low-dose furosemide was reinitiated. Blood cultures 9/16 revealed E. coli; cultures were negative on repeat.  Antibiotics include Rocephin from 9/19-9/22 and Ancef from 9/22-9/26. PT/OT felt SNF placement for rehab was indicated.  Neurology follow-up was to be with [facility] with Frann Rider, NP in approximately 1 month.  Past medical and surgical history: Includes essential hypertension, dyslipidemia, GERD, vitamin D deficiency, CHF, aortic  insufficiency, and history of colon polyps. Surgeries and procedures include coronary artery stenting as well as colon polypectomy.  Social history: Nondrinker; former smoker.  Family history: Both parents had a history of hypertension and stroke.  Review of systems:  Could not be completed due to dementia.  Her responses are unfocused.  PT/OT was concerned she might have injured her left hip at the time of her stroke and fall.  This was based on apparent pain with passive position change in bed.  Portable pelvic films 03/16/2009 I did not reveal any fractures.  According to speech therapy she scored 0 on her BIMS mental status test. Apparently yesterday she may have been complaining of pain in the right leg and right foot. Today she seems to indicate some chest pain but her responses were slurred and virtually impossible to discern.  Physical exam:  Pertinent or positive findings: As noted she is unfocused with no purposeful movement. She appears chronically ill. The lower lids exhibit ectropion.  Eyebrows are essentially absent.  The supraorbital ridges are prominent with a boss on the left.  She is edentulous.Slight tachycardia present. Pedal pulses are decreased.  There is atrophy of the calves.  She has hyperpigmentation and scarring over the lower extremities, greater in the left shin than the right.  She has scattered bruises over the upper extremities.  There is interosseous wasting.  There was no localizing pain with passive range of motion of either hip or knees.  General appearance: no acute distress, increased work of breathing is present.   Lymphatic: No lymphadenopathy about the head, neck, axilla. Eyes: No conjunctival inflammation or lid edema is present. There is no scleral icterus. Ears:  External ear exam shows no significant lesions or deformities.   Nose:  External nasal examination shows no deformity or inflammation. Nasal mucosa are pink  and moist without lesions, exudates  Oral exam: Lips and gums are healthy appearing.There is no oropharyngeal erythema or exudate. Neck:  No thyromegaly, masses, tenderness noted.    Heart:  No gallop, murmur, click, rub.  Lungs: Chest clear to auscultation without wheezes, rhonchi, rales, rubs. Abdomen: Bowel sounds are normal.  Abdomen is soft and nontender with no organomegaly, hernias, masses. GU: Deferred  Extremities:  No cyanosis, clubbing, edema. Neurologic exam:  Balance, Rhomberg, finger to nose testing could not be completed due to clinical state Skin: Warm & dry w/o tenting. No significant rash.  See clinical summary under each active problem in the Problem List with associated updated therapeutic plan

## 2019-03-30 NOTE — Assessment & Plan Note (Signed)
Creatinine returned to baseline by discharge 03/27/2019

## 2019-03-30 NOTE — Assessment & Plan Note (Signed)
Status post full course antibiotics Rocephin transitioned to Ancef for total of 7 days

## 2019-03-30 NOTE — Patient Instructions (Signed)
See assessment and plan under each diagnosis in the problem list and acutely for this visit 

## 2019-03-31 NOTE — Assessment & Plan Note (Signed)
BP monitor& control as HTN felt to be etiologic in CVA

## 2019-04-08 LAB — BASIC METABOLIC PANEL
BUN: 43 — AB (ref 4–21)
CO2: 25 — AB (ref 13–22)
Chloride: 105 (ref 99–108)
Creatinine: 1.2 — AB (ref 0.5–1.1)
Glucose: 98
Potassium: 3.7 (ref 3.4–5.3)
Sodium: 145 (ref 137–147)

## 2019-04-08 LAB — COMPREHENSIVE METABOLIC PANEL
Calcium: 8.7 (ref 8.7–10.7)
GFR calc Af Amer: 45.37
GFR calc non Af Amer: 39.14

## 2019-04-08 LAB — CBC AND DIFFERENTIAL
HCT: 26 — AB (ref 36–46)
Hemoglobin: 8.7 — AB (ref 12.0–16.0)
Neutrophils Absolute: 5
Platelets: 134 — AB (ref 150–399)
WBC: 6.7

## 2019-04-08 LAB — CBC: RBC: 2.59 — AB (ref 3.87–5.11)

## 2019-04-20 ENCOUNTER — Non-Acute Institutional Stay (SKILLED_NURSING_FACILITY): Payer: Medicare Other | Admitting: Adult Health

## 2019-04-20 ENCOUNTER — Encounter: Payer: Self-pay | Admitting: Adult Health

## 2019-04-20 DIAGNOSIS — K219 Gastro-esophageal reflux disease without esophagitis: Secondary | ICD-10-CM | POA: Diagnosis not present

## 2019-04-20 DIAGNOSIS — I2510 Atherosclerotic heart disease of native coronary artery without angina pectoris: Secondary | ICD-10-CM | POA: Diagnosis not present

## 2019-04-20 DIAGNOSIS — I5032 Chronic diastolic (congestive) heart failure: Secondary | ICD-10-CM

## 2019-04-20 DIAGNOSIS — I10 Essential (primary) hypertension: Secondary | ICD-10-CM

## 2019-04-20 DIAGNOSIS — F0150 Vascular dementia without behavioral disturbance: Secondary | ICD-10-CM | POA: Diagnosis not present

## 2019-04-20 NOTE — Progress Notes (Signed)
Location:  [facility] Room Number: 122/A Place of Service:  SNF (31) Provider:  Durenda Age, DNP, FNP-BC  Patient Care Team: Ina Homes, MD as PCP - General (Internal Medicine)  Extended Emergency Contact Information Primary Emergency Contact: [name] Mobile Phone: [phone] Relation: Nephew Secondary Emergency Contact: Gilberto Better Mobile Phone: [phone] Relation: Nephew Interpreter needed? No  Code Status:  DNR  Goals of care: Advanced Directive information Advanced Directives 04/20/2019  Does Patient Have a Medical Advance Directive? Yes  Type of Advance Directive Out of facility DNR (pink MOST or yellow form)  Does patient want to make changes to medical advance directive? -  Copy of [place] in Chart? -  Would patient like information on creating a medical advance directive? -  Pre-existing out of facility DNR order (yellow form or pink MOST form) Yellow form placed in chart (order not valid for inpatient use)     Chief Complaint  Patient presents with  . Medical Management of Chronic Issues    Routine visit of medical managenent     HPI:  Pt is a 85 y.o. female seen today for medical management of chronic diseases. She has PMH of CHF, CKD stage 3, hypertension, hyperlipidemia, aortic insufficiency and CAD. She has bilateral ectropion. She had an episode of combative towards the staff during ADL care. BPs 126/66, 114/76, 112/72, 127/63.  She is currently taking amlodipine, bisoprolol fumarate and losartan for hypertension.  No reported chest pains.  She is currently taking isosorbide mononitrate ER for CAD.   Past Medical History:  Diagnosis Date  . Anemia   . Aortic insufficiency    mild  . Arthritis    "arms" (08/09/2015)  . CAD (coronary artery disease)    cath 08/09/2014 95% stenosis in prox to mid RCA s/p DES, 80-90% prox OM2, 50% distal LAD  . CHF (congestive heart failure) ([place])   . CKD  (chronic kidney disease) stage 3, GFR 30-59 ml/min   . Colon polyp    2009 colonoscopy, not retrieved for pathology  . Dyspnea   . Ectropion of left lower eyelid   . GERD (gastroesophageal reflux disease) 2009   EGD with benign gastric polyp too  . Hyperlipidemia   . Hypertension   . Pneumonia    history of  . Stroke ([place]) 1975  . Vitamin D deficiency    Past Surgical History:  Procedure Laterality Date  . ABDOMINAL HYSTERECTOMY    . APPENDECTOMY    . ARTERY BIOPSY Left 12/16/2012   Procedure: BIOPSY TEMPORAL ARTERY;  Surgeon: Mal Misty, MD;  Location: [facility];  Service: Vascular;  Laterality: Left;  . CARDIAC CATHETERIZATION    . LEFT HEART CATHETERIZATION WITH CORONARY ANGIOGRAM N/A 08/09/2014   Procedure: LEFT HEART CATHETERIZATION WITH CORONARY ANGIOGRAM;  Surgeon: Peter M Martinique, MD;  Location: [facility] CATH LAB;  Service: Cardiovascular;  Laterality: N/A;  . PERCUTANEOUS CORONARY STENT INTERVENTION (PCI-S)  08/09/2014   Procedure: PERCUTANEOUS CORONARY STENT INTERVENTION (PCI-S);  Surgeon: Peter M Martinique, MD;  Location: [facility] CATH LAB;  Service: Cardiovascular;;  . TONSILLECTOMY      No Known Allergies  Outpatient Encounter Medications as of 04/20/2019  Medication Sig  . acetaminophen (TYLENOL) 325 MG tablet Take 650 mg by mouth every 6 (six) hours as needed for mild pain or headache.   . albuterol (VENTOLIN HFA) 108 (90 Base) MCG/ACT inhaler Inhale 1 puff into the lungs every 6 (six) hours as needed for wheezing or shortness  of breath.  . Amino Acids-Protein Hydrolys (FEEDING SUPPLEMENT, PRO-STAT SUGAR FREE 64,) LIQD Place 30 mLs into feeding tube 2 (two) times daily.  Marland Kitchen amLODipine (NORVASC) 10 MG tablet Take 1 tablet (10 mg total) by mouth daily.  Marland Kitchen atorvastatin (LIPITOR) 40 MG tablet Take 1 tablet (40 mg total) by mouth daily at 6 PM.  . bisacodyl (DULCOLAX) 10 MG suppository If not relieved by MOM, give 10 mg Bisacodyl suppositiory rectally X 1 dose in 24 hours as needed (Do not  use constipation standing orders for residents with renal failure/CFR less than 30. Contact MD for orders) (Physician Order)  . bisoprolol (ZEBETA) 10 MG tablet Place 1 tablet (10 mg total) into feeding tube 2 (two) times daily.  . feeding supplement, ENSURE ENLIVE, (ENSURE ENLIVE) LIQD Take 237 mLs by mouth 2 (two) times daily between meals.  . furosemide (LASIX) 20 MG tablet TAKE 1 TABLET(20 MG) BY MOUTH DAILY  . isosorbide mononitrate (IMDUR) 60 MG 24 hr tablet Take 1 tablet (60 mg total) by mouth daily.  Marland Kitchen losartan (COZAAR) 100 MG tablet TAKE 1 TABLET(100 MG) BY MOUTH DAILY  . magnesium hydroxide (MILK OF MAGNESIA) 400 MG/5ML suspension Take 30 mLs by mouth daily as needed for mild constipation.   . Nutritional Supplement LIQD Take 120 mLs by mouth 3 (three) times daily. MedPass  . pantoprazole sodium (PROTONIX) 40 mg/20 mL PACK Take 20 mLs (40 mg total) by mouth daily.  . polyvinyl alcohol (ARTIFICIAL TEARS) 1.4 % ophthalmic solution Place 1 drop into both eyes 2 (two) times daily. For dry eyes  . senna (SENOKOT) 8.6 MG TABS tablet Take 2 tablets by mouth at bedtime.  . Sodium Phosphates (RA SALINE ENEMA RE) If not relieved by Biscodyl suppository, give disposable Saline Enema rectally X 1 dose/24 hrs as needed (Do not use constipation standing orders for residents with renal failure/CFR less than 30. Contact MD for orders)(Physician Or   No facility-administered encounter medications on file as of 04/20/2019.     Review of Systems  Unable to obtain due to dementia.    Immunization History  Administered Date(s) Administered  . Influenza Whole 03/28/2008  . Influenza,inj,Quad PF,6+ Mos 08/09/2013, 03/30/2015, 03/24/2017  . Influenza-Unspecified 03/31/2019  . Pneumococcal Conjugate-13 12/08/2014  . Pneumococcal Polysaccharide-23 09/18/2012  . Tdap 02/01/2016, 06/21/2018   Pertinent  Health Maintenance Due  Topic Date Due  . INFLUENZA VACCINE  Completed  . DEXA SCAN  Completed  .  PNA vac Low Risk Adult  Completed   Fall Risk  03/19/2018 10/16/2017 09/26/2017 07/14/2017 04/21/2017  Falls in the past year? No Yes No No No  Number falls in past yr: - 1 - - -  Injury with Fall? - Yes - - -  Risk Factor Category  - High Fall Risk - - -  Risk for fall due to : - Impaired mobility;Impaired balance/gait Impaired balance/gait;Impaired mobility - -  Follow up - Falls prevention discussed - - -  Comment - - - - -     Vitals:   04/20/19 0931  BP: 126/66  Pulse: (!) 55  Resp: 18  Temp: 97.6 F (36.4 C)  TempSrc: Oral  SpO2: 97%  Weight: 146 lb 3.2 oz (66.3 kg)  Height: 4\' 10"  (1.473 m)   Body mass index is 30.56 kg/m.  Physical Exam  GENERAL APPEARANCE: Well nourished. In no acute distress. Obese SKIN:  Skin is warm and dry.  EYES:  +Bilateral ectropion MOUTH and THROAT: Lips are without lesions.  Oral mucosa is moist and without lesions.  RESPIRATORY: Breathing is even & unlabored, BS CTAB CARDIAC: RRR, no murmur,no extra heart sounds, no edema GI: Abdomen soft, normal BS, no masses, no tenderness EXTREMITIES:  Able to move X 4 extremities NEUROLOGICAL: There is no tremor. Speech is clear. Alert to self, disoriented to time and place. PSYCHIATRIC:  Affect and behavior are appropriate  Labs reviewed: Recent Labs    10/20/18 0055 10/20/18 0433  03/17/19 1922  03/23/19 0117 03/24/19 0512 03/25/19 0346  NA 143 144   < >  --    < > 139 133* 132*  K 3.4* 3.7   < >  --    < > 3.6 4.2 4.1  CL 110 111   < >  --    < > 107 101 97*  CO2 22 21*   < >  --    < > 24 20* 24  GLUCOSE 92 97   < >  --    < > 123* 118* 131*  BUN 17 15   < >  --    < > 31* 38* 42*  CREATININE 1.28* 1.16*   < >  --    < > 1.03* 0.99 0.99  CALCIUM 8.0* 8.5*   < >  --    < > 7.6* 7.9* 8.1*  MG 1.5* 2.0  --  1.7  --   --   --   --    < > = values in this interval not displayed.   Recent Labs    07/26/18 0431 10/20/18 0055 03/17/19 1811  AST 20 19 34  ALT 11 10 17   ALKPHOS 65 64 60   BILITOT 1.3* 0.9 2.4*  PROT 5.7* 6.4* 6.7  ALBUMIN 3.0* 3.6 3.5   Recent Labs    08/04/18 10/20/18 0055 03/17/19 1811  03/23/19 0117 03/24/19 0512 03/25/19 0346  WBC 3.2 3.4* 8.4   < > 6.0 5.3 6.1  NEUTROABS 2 1.8 7.3  --   --   --   --   HGB 10.0* 9.9* 13.3   < > 9.7* 10.5* 10.1*  HCT 29* 31.2* 40.0   < > 27.9* 30.9* 30.1*  MCV  --  96.9 98.5   < > 97.2 98.4 97.4  PLT 148* 118* 122*   < > 90* 80* 121*   < > = values in this interval not displayed.   Lab Results  Component Value Date   TSH 0.923 05/31/2014   Lab Results  Component Value Date   HGBA1C 5.1 03/24/2019   Lab Results  Component Value Date   CHOL 185 03/19/2019   HDL 43 03/19/2019   LDLCALC 113 (H) 03/19/2019   TRIG 146 03/19/2019   CHOLHDL 4.3 03/19/2019    Significant Diagnostic Results in last 30 days:  Dg Chest Port 1 View  Result Date: 03/26/2019 CLINICAL DATA:  Follow-up. Admitted patient with intracranial hemorrhage. EXAM: PORTABLE CHEST 1 VIEW COMPARISON:  Radiograph 03/22/2019, 03/20/2019 FINDINGS: Enteric tube is been removed. Cardiomegaly is unchanged. Improved vascular congestion from prior exam. Retrocardiac atelectasis. Atherosclerosis of the thoracic aorta. No large pleural effusion. No pneumothorax. Degenerative change in the shoulders. IMPRESSION: 1. Unchanged cardiomegaly. Improved vascular congestion from prior exam. 2. Minor retrocardiac atelectasis. 3.  Aortic Atherosclerosis (ICD10-I70.0). Electronically Signed   By: Keith Rake M.D.   On: 03/26/2019 19:31   Dg Chest Port 1 View  Result Date: 03/22/2019 CLINICAL DATA:  Shortness of breath EXAM: PORTABLE CHEST  1 VIEW COMPARISON:  03/20/2019 FINDINGS: Cardiomegaly. Mild vascular congestion. No confluent opacities, effusions or overt edema. No acute bony abnormality. IMPRESSION: Mild cardiomegaly.  Vascular congestion. Electronically Signed   By: Rolm Baptise M.D.   On: 03/22/2019 19:01    Assessment/Plan  1. Coronary artery disease  involving native heart without angina pectoris, unspecified vessel or lesion type -Denies chest pain, continue isosorbide mononitrate ER and atorvastatin  2. Chronic heart failure with preserved ejection fraction (HCC) -  No SOB, continue bisoprolol fumarate and Lasix  3. Gastroesophageal reflux disease, unspecified whether esophagitis present -Continue Protonix  4. Essential hypertension -Well-controlled, continue amlodipine and bisoprolol  5. Vascular dementia without behavioral disturbance ([place]) -Continue supportive care and fall precautions     Family/ staff Communication: Discussed plan of care with resident and charge nurse.  Labs/tests ordered: None  Goals of care:   Long-term care   Durenda Age, DNP, FNP-BC [facility] [phone] (Monday-Friday 8:00 a.m. - 5:00 p.m.) [phone] (after hours)

## 2019-04-27 ENCOUNTER — Telehealth: Payer: Self-pay | Admitting: Adult Health

## 2019-04-27 ENCOUNTER — Inpatient Hospital Stay: Payer: Medicare Other | Admitting: Adult Health

## 2019-04-27 NOTE — Telephone Encounter (Signed)
Jane [name] from [place] called stating that the pt was being very combative and not cooperating to get ready for her appt today and they are wanting to know if they can do the visit virtually. Please advise.

## 2019-04-27 NOTE — Progress Notes (Deleted)
[facility] [address] [place]. [place] [address] [phone]       HOSPITAL FOLLOW UP NOTE  Jane [name] Date of Birth:  Sep 13, 1928 Medical Record Number:  [id number]   Reason for Referral:  hospital stroke follow up    CHIEF COMPLAINT:  No chief complaint on file.   HPI: Jane [name] being seen today for in office hospital follow-up regarding left frontal small periventricular hemorrhage with extensive IVH likely secondary to uncontrolled HTN on 03/17/2019.  History obtained from *** and chart review. Reviewed all radiology images and labs personally.  Jane [name] is a 85 y.o. female with history of HTN, HLD, previous stroke, CAD, CHF, CKD  presented after being found down with severe HTN, L gaze deviation, not following commands.  Stroke work-up showed left frontal small periventricular hemorrhage with extensive IVH likely secondary to uncontrolled HTN.  CT head showed large IVH with ventricular dilation, 9 mm left frontal lobe & scalp edema and hematoma along vertex.  CTA head/neck no underlying vascular lesion or left hemorrhage, arthrosclerosis bilateral ICA bifurcations 60%, 70% or more right V4 and 50 to 70% left V4.  MRI stable IVH with increased SAH, hemorrhage originating from the left periventricular white matter, stable ventriculomegaly without evidence of midline shift or infarct and scalp hematoma.  Repeat CT head 03/21/2019 stable IVH and SAH.  2D echo normal EF without cardiac source of embolus identified.  LDL 113.  A1c 5.2.  Previously on aspirin which was discontinued due to hemorrhage.  Blood pressure upon arrival to 234/127 treated with Cleviprex and resumed home medication with increase of Zebeta and added amlodipine.  Resumed atorvastatin 40 mg daily.  Other stroke risk factors include advanced age, former tobacco use, prior history of stroke and CAD.  Other active problems include thrombocytopenia, AKA on CKD stage III, E. coli  bacteremia with fever, hypokalemia and history of temporal arteritis.  Residual deficits of generalized weakness and cognitive impairment and discharge to SNF for ongoing therapy.     ROS:   14 system review of systems performed and negative with exception of ***  PMH:  Past Medical History:  Diagnosis Date  . Anemia   . Aortic insufficiency    mild  . Arthritis    "arms" (08/09/2015)  . CAD (coronary artery disease)    cath 08/09/2014 95% stenosis in prox to mid RCA s/p DES, 80-90% prox OM2, 50% distal LAD  . CHF (congestive heart failure) ([place])   . CKD (chronic kidney disease) stage 3, GFR 30-59 ml/min   . Colon polyp    2009 colonoscopy, not retrieved for pathology  . Dyspnea   . Ectropion of left lower eyelid   . GERD (gastroesophageal reflux disease) 2009   EGD with benign gastric polyp too  . Hyperlipidemia   . Hypertension   . Pneumonia    history of  . Stroke ([place]) 1975  . Vitamin D deficiency     PSH:  Past Surgical History:  Procedure Laterality Date  . ABDOMINAL HYSTERECTOMY    . APPENDECTOMY    . ARTERY BIOPSY Left 12/16/2012   Procedure: BIOPSY TEMPORAL ARTERY;  Surgeon: Mal Misty, MD;  Location: [facility];  Service: Vascular;  Laterality: Left;  . CARDIAC CATHETERIZATION    . LEFT HEART CATHETERIZATION WITH CORONARY ANGIOGRAM N/A 08/09/2014   Procedure: LEFT HEART CATHETERIZATION WITH CORONARY ANGIOGRAM;  Surgeon: Peter M Martinique, MD;  Location: [facility] CATH LAB;  Service: Cardiovascular;  Laterality:  N/A;  . PERCUTANEOUS CORONARY STENT INTERVENTION (PCI-S)  08/09/2014   Procedure: PERCUTANEOUS CORONARY STENT INTERVENTION (PCI-S);  Surgeon: Peter M Martinique, MD;  Location: [facility] CATH LAB;  Service: Cardiovascular;;  . TONSILLECTOMY      Social History:  Social History   Socioeconomic History  . Marital status: Widowed    Spouse name: Not on file  . Number of children: Not on file  . Years of education: Not on file  . Highest education level: Not on file   Occupational History  . Not on file  Social Needs  . Financial resource strain: Not on file  . Food insecurity    Worry: Not on file    Inability: Not on file  . Transportation needs    Medical: Not on file    Non-medical: Not on file  Tobacco Use  . Smoking status: Former Smoker    Types: Cigarettes    Quit date: 07/02/1979    Years since quitting: 39.8  . Smokeless tobacco: Former Systems developer  . Tobacco comment: Started in teenage years - Quit 1980  Substance and Sexual Activity  . Alcohol use: No    Alcohol/week: 0.0 standard drinks    Comment: Quit alcohol 1975-76  . Drug use: No  . Sexual activity: Never  Lifestyle  . Physical activity    Days per week: Not on file    Minutes per session: Not on file  . Stress: Not on file  Relationships  . Social Herbalist on phone: Not on file    Gets together: Not on file    Attends religious service: Not on file    Active member of club or organization: Not on file    Attends meetings of clubs or organizations: Not on file    Relationship status: Not on file  . Intimate partner violence    Fear of current or ex partner: Not on file    Emotionally abused: Not on file    Physically abused: Not on file    Forced sexual activity: Not on file  Other Topics Concern  . Not on file  Social History Narrative   Jane [name] lives alone in an apartment in [place]. Her nephew, and her son and his wife live nearby and visit and help often. She is independent in ADLs but depends on family for IADLs. She is ambulatory in her apartment without a walker but requires this when she leaves her apartment.     Family History:  Family History  Problem Relation Age of Onset  . Stroke Mother   . Hypertension Mother   . Stroke Father   . Hypertension Father   . Diabetes Sister     Medications:   Current Outpatient Medications on File Prior to Visit  Medication Sig Dispense Refill  . acetaminophen (TYLENOL) 325 MG tablet Take 650 mg by  mouth every 6 (six) hours as needed for mild pain or headache.     . albuterol (VENTOLIN HFA) 108 (90 Base) MCG/ACT inhaler Inhale 1 puff into the lungs every 6 (six) hours as needed for wheezing or shortness of breath.    . Amino Acids-Protein Hydrolys (FEEDING SUPPLEMENT, PRO-STAT SUGAR FREE 64,) LIQD Place 30 mLs into feeding tube 2 (two) times daily. 887 mL 0  . amLODipine (NORVASC) 10 MG tablet Take 1 tablet (10 mg total) by mouth daily. 30 tablet 0  . atorvastatin (LIPITOR) 40 MG tablet Take 1 tablet (40 mg total) by mouth daily at  6 PM. 30 tablet 0  . bisacodyl (DULCOLAX) 10 MG suppository If not relieved by MOM, give 10 mg Bisacodyl suppositiory rectally X 1 dose in 24 hours as needed (Do not use constipation standing orders for residents with renal failure/CFR less than 30. Contact MD for orders) (Physician Order)    . bisoprolol (ZEBETA) 10 MG tablet Place 1 tablet (10 mg total) into feeding tube 2 (two) times daily. 60 tablet 0  . feeding supplement, ENSURE ENLIVE, (ENSURE ENLIVE) LIQD Take 237 mLs by mouth 2 (two) times daily between meals. 237 mL 12  . furosemide (LASIX) 20 MG tablet TAKE 1 TABLET(20 MG) BY MOUTH DAILY 90 tablet 1  . isosorbide mononitrate (IMDUR) 60 MG 24 hr tablet Take 1 tablet (60 mg total) by mouth daily. 30 tablet 0  . losartan (COZAAR) 100 MG tablet TAKE 1 TABLET(100 MG) BY MOUTH DAILY 90 tablet 1  . magnesium hydroxide (MILK OF MAGNESIA) 400 MG/5ML suspension Take 30 mLs by mouth daily as needed for mild constipation.     . Nutritional Supplement LIQD Take 120 mLs by mouth 3 (three) times daily. MedPass    . pantoprazole sodium (PROTONIX) 40 mg/20 mL PACK Take 20 mLs (40 mg total) by mouth daily. 30 mL 0  . polyvinyl alcohol (ARTIFICIAL TEARS) 1.4 % ophthalmic solution Place 1 drop into both eyes 2 (two) times daily. For dry eyes 15 mL 0  . senna (SENOKOT) 8.6 MG TABS tablet Take 2 tablets by mouth at bedtime.    . Sodium Phosphates (RA SALINE ENEMA RE) If not  relieved by Biscodyl suppository, give disposable Saline Enema rectally X 1 dose/24 hrs as needed (Do not use constipation standing orders for residents with renal failure/CFR less than 30. Contact MD for orders)(Physician Or     No current facility-administered medications on file prior to visit.     Allergies:  No Known Allergies   Physical Exam  There were no vitals filed for this visit. There is no height or weight on file to calculate BMI. No exam data present  Depression screen [facility] 2/9 03/19/2018  Decreased Interest 0  Down, Depressed, Hopeless 0  PHQ - 2 Score 0  Some recent data might be hidden     General: well developed, well nourished, seated, in no evident distress Head: head normocephalic and atraumatic.   Neck: supple with no carotid or supraclavicular bruits Cardiovascular: regular rate and rhythm, no murmurs Musculoskeletal: no deformity Skin:  no rash/petichiae Vascular:  Normal pulses all extremities   Neurologic Exam Mental Status: Awake and fully alert. Oriented to place and time. Recent and remote memory intact. Attention span, concentration and fund of knowledge appropriate. Mood and affect appropriate.  Cranial Nerves: Fundoscopic exam reveals sharp disc margins. Pupils equal, briskly reactive to light. Extraocular movements full without nystagmus. Visual fields full to confrontation. Hearing intact. Facial sensation intact. Face, tongue, palate moves normally and symmetrically.  Motor: Normal bulk and tone. Normal strength in all tested extremity muscles. Sensory.: intact to touch , pinprick , position and vibratory sensation.  Coordination: Rapid alternating movements normal in all extremities. Finger-to-nose and heel-to-shin performed accurately bilaterally. Gait and Station: Arises from chair without difficulty. Stance is normal. Gait demonstrates normal stride length and balance Reflexes: 1+ and symmetric. Toes downgoing.     NIHSS  *** Modified  [place]  *** CHA2DS2-VASc *** HAS-BLED ***   Diagnostic Data (Labs, Imaging, Testing)  CT HEAD WO CONTRAST ***  CT ANGIO HEAD W OR WO  CONTRAST CT ANGIO NECK W OR WO CONTRAST ***  MR BRAIN WO CONTRAST ***  MR MRA HEAD  MR MRA NECK ***  ECHOCARDIOGRAM ***    ASSESSMENT: Jane [name] is a 85 y.o. year old female presented with *** on *** secondary to ***. Vascular risk factors include ***.     PLAN:  1. *** : Continue {anticoagulants:31417}  and ***  for secondary stroke prevention. Maintain strict control of hypertension with blood pressure goal below 130/90, diabetes with hemoglobin A1c goal below 6.5% and cholesterol with LDL cholesterol (bad cholesterol) goal below 70 mg/dL.  I also advised the patient to eat a healthy diet with plenty of whole grains, cereals, fruits and vegetables, exercise regularly with at least 30 minutes of continuous activity daily and maintain ideal body weight. 2. HTN: Advised to continue current treatment regimen.  Today's BP ***.  Advised to continue to monitor at home along with continued follow-up with PCP for management 3. HLD: Advised to continue current treatment regimen along with continued follow-up with PCP for future prescribing and monitoring of lipid panel 4. DMII: Advised to continue to monitor glucose levels at home along with continued follow-up with PCP for management and monitoring    Follow up in *** or call earlier if needed   Greater than 50% of time during this 45 minute visit was spent on counseling, explanation of diagnosis of ***, reviewing risk factor management of ***, planning of further management along with potential future management, and discussion with patient and family answering all questions.    Frann Rider, AGNP-BC  [facility] [address] [place], [place] [address]  Phone [phone] Fax [phone] Note: This document was prepared with digital dictation and  possible smart phrase technology. Any transcriptional errors that result from this process are unintentional.

## 2019-04-28 NOTE — Telephone Encounter (Signed)
It would be preferred to reschedule visit for an in office visit once she is cooperative.  If she is currently being combative and not cooperating at this time, a virtual visit will likely be difficult

## 2019-04-29 ENCOUNTER — Encounter: Payer: Self-pay | Admitting: Adult Health

## 2019-04-29 NOTE — Telephone Encounter (Signed)
In response to a request from [employer], phone rep called Hilda Blades back (at [facility]) phone rang and rang was transferred to [place] unit of the facility and no one picked up.  Call was made to try and schedule an in office visit for pt with NP Janett Billow.

## 2019-05-03 NOTE — Telephone Encounter (Signed)
Called and spoke to [place], at [facility].  Pt is doing ok.  R/S appt 06-09-19 at [facility].  (relayed per JM/NP that she would like to see pt in the office).  She verbalized understanding.

## 2019-05-13 ENCOUNTER — Non-Acute Institutional Stay (SKILLED_NURSING_FACILITY): Payer: Medicare Other | Admitting: Adult Health

## 2019-05-13 ENCOUNTER — Encounter: Payer: Self-pay | Admitting: Adult Health

## 2019-05-13 DIAGNOSIS — I2510 Atherosclerotic heart disease of native coronary artery without angina pectoris: Secondary | ICD-10-CM

## 2019-05-13 DIAGNOSIS — Z8673 Personal history of transient ischemic attack (TIA), and cerebral infarction without residual deficits: Secondary | ICD-10-CM

## 2019-05-13 DIAGNOSIS — I5032 Chronic diastolic (congestive) heart failure: Secondary | ICD-10-CM

## 2019-05-13 DIAGNOSIS — I10 Essential (primary) hypertension: Secondary | ICD-10-CM | POA: Diagnosis not present

## 2019-05-13 NOTE — Progress Notes (Signed)
Location:  [facility] Room Number: 122/A Place of Service:  SNF (31) Provider:  Durenda Age, DNP, FNP-BC  Patient Care Team: Ina Homes, MD as PCP - General (Internal Medicine)  Extended Emergency Contact Information Primary Emergency Contact: [name] Mobile Phone: [phone] Relation: Nephew Secondary Emergency Contact: Gilberto Better Mobile Phone: [phone] Relation: Nephew Interpreter needed? No  Code Status:  DNR  Goals of care: Advanced Directive information Advanced Directives 05/13/2019  Does Patient Have a Medical Advance Directive? Yes  Type of Advance Directive Out of facility DNR (pink MOST or yellow form)  Does patient want to make changes to medical advance directive? No - Patient declined  Copy of [place] in Chart? -  Would patient like information on creating a medical advance directive? -  Pre-existing out of facility DNR order (yellow form or pink MOST form) -     Chief Complaint  Patient presents with  . Medical Management of Chronic Issues    Routine visit of medical management    HPI:  Pt is a 85 y.o. female seen today for medical management of chronic diseases. She has PMH of CHF, CKD stage 3, hypertension, hyperlipidemia, aortic insufficiency and CAD. She was reported to have occasional resistance to care. No complaints of chest pains. She takes Isosorbide Mononitrate for CAD. No reported SOB. She takes Lasix and Bisoprolol for CHF. She is currently having PT, OT and ST.   Past Medical History:  Diagnosis Date  . Anemia   . Aortic insufficiency    mild  . Arthritis    "arms" (08/09/2015)  . CAD (coronary artery disease)    cath 08/09/2014 95% stenosis in prox to mid RCA s/p DES, 80-90% prox OM2, 50% distal LAD  . CHF (congestive heart failure) ([place])   . CKD (chronic kidney disease) stage 3, GFR 30-59 ml/min   . Colon polyp    2009 colonoscopy, not retrieved for pathology  .  Dyspnea   . Ectropion of left lower eyelid   . GERD (gastroesophageal reflux disease) 2009   EGD with benign gastric polyp too  . Hyperlipidemia   . Hypertension   . Pneumonia    history of  . Stroke ([place]) 1975  . Vitamin D deficiency    Past Surgical History:  Procedure Laterality Date  . ABDOMINAL HYSTERECTOMY    . APPENDECTOMY    . ARTERY BIOPSY Left 12/16/2012   Procedure: BIOPSY TEMPORAL ARTERY;  Surgeon: Mal Misty, MD;  Location: [facility];  Service: Vascular;  Laterality: Left;  . CARDIAC CATHETERIZATION    . LEFT HEART CATHETERIZATION WITH CORONARY ANGIOGRAM N/A 08/09/2014   Procedure: LEFT HEART CATHETERIZATION WITH CORONARY ANGIOGRAM;  Surgeon: Peter M Martinique, MD;  Location: [facility] C[facility] LAB;  Service: Cardiovascular;  Laterality: N/A;  . PERCUTANEOUS CORONARY STENT INTERVENTION (PCI-S)  08/09/2014   Procedure: PERCUTANEOUS CORONARY STENT INTERVENTION (PCI-S);  Surgeon: Peter M Martinique, MD;  Location: [facility] CATH LAB;  Service: Cardiovascular;;  . TONSILLECTOMY      No Known Allergies  Outpatient Encounter Medications as of 05/13/2019  Medication Sig  . acetaminophen (TYLENOL) 325 MG tablet Take 650 mg by mouth every 6 (six) hours as needed for mild pain or headache.   . albuterol (VENTOLIN HFA) 108 (90 Base) MCG/ACT inhaler Inhale 1 puff into the lungs every 6 (six) hours as needed for wheezing or shortness of breath.  . Amino Acids-Protein Hydrolys (FEEDING SUPPLEMENT, PRO-STAT SUGAR FREE 64,) LIQD Place 30 mLs into  feeding tube 2 (two) times daily.  Marland Kitchen amLODipine (NORVASC) 10 MG tablet Take 1 tablet (10 mg total) by mouth daily.  Marland Kitchen atorvastatin (LIPITOR) 40 MG tablet Take 1 tablet (40 mg total) by mouth daily at 6 PM.  . bisacodyl (DULCOLAX) 10 MG suppository If not relieved by MOM, give 10 mg Bisacodyl suppositiory rectally X 1 dose in 24 hours as needed (Do not use constipation standing orders for residents with renal failure/CFR less than 30. Contact MD for orders) (Physician  Order)  . bisoprolol (ZEBETA) 10 MG tablet Place 1 tablet (10 mg total) into feeding tube 2 (two) times daily.  . feeding supplement, ENSURE ENLIVE, (ENSURE ENLIVE) LIQD Take 237 mLs by mouth 2 (two) times daily between meals.  . furosemide (LASIX) 20 MG tablet TAKE 1 TABLET(20 MG) BY MOUTH DAILY  . isosorbide mononitrate (IMDUR) 60 MG 24 hr tablet Take 1 tablet (60 mg total) by mouth daily.  Marland Kitchen losartan (COZAAR) 100 MG tablet TAKE 1 TABLET(100 MG) BY MOUTH DAILY  . magnesium hydroxide (MILK OF MAGNESIA) 400 MG/5ML suspension Take 30 mLs by mouth daily as needed for mild constipation.   . Nutritional Supplement LIQD Take 120 mLs by mouth 3 (three) times daily. MedPass  . pantoprazole sodium (PROTONIX) 40 mg/20 mL PACK Take 20 mLs (40 mg total) by mouth daily.  . polyvinyl alcohol (ARTIFICIAL TEARS) 1.4 % ophthalmic solution Place 1 drop into both eyes 2 (two) times daily. For dry eyes  . senna (SENOKOT) 8.6 MG TABS tablet Take 2 tablets by mouth at bedtime.  . Sodium Phosphates (RA SALINE ENEMA RE) If not relieved by Biscodyl suppository, give disposable Saline Enema rectally X 1 dose/24 hrs as needed (Do not use constipation standing orders for residents with renal failure/CFR less than 30. Contact MD for orders)(Physician Or   No facility-administered encounter medications on file as of 05/13/2019.     Review of Systems Unable to obtain due to dementia   Immunization History  Administered Date(s) Administered  . Influenza Whole 03/28/2008  . Influenza,inj,Quad PF,6+ Mos 08/09/2013, 03/30/2015, 03/24/2017  . Influenza-Unspecified 03/31/2019  . Pneumococcal Conjugate-13 12/08/2014  . Pneumococcal Polysaccharide-23 09/18/2012  . Tdap 02/01/2016, 06/21/2018   Pertinent  Health Maintenance Due  Topic Date Due  . INFLUENZA VACCINE  Completed  . DEXA SCAN  Completed  . PNA vac Low Risk Adult  Completed   Fall Risk  03/19/2018 10/16/2017 09/26/2017 07/14/2017 04/21/2017  Falls in the past  year? No Yes No No No  Number falls in past yr: - 1 - - -  Injury with Fall? - Yes - - -  Risk Factor Category  - High Fall Risk - - -  Risk for fall due to : - Impaired mobility;Impaired balance/gait Impaired balance/gait;Impaired mobility - -  Follow up - Falls prevention discussed - - -  Comment - - - - -     Vitals:   05/13/19 1527  BP: (!) 107/55  Pulse: 64  Resp: 18  Temp: (!) 97.5 F (36.4 C)  TempSrc: Oral  SpO2: 95%  Weight: 145 lb 12.8 oz (66.1 kg)  Height: 4\' 10"  (1.473 m)   Body mass index is 30.47 kg/m.  Physical Exam  GENERAL APPEARANCE: Well nourished. In no acute distress. Obese SKIN:  Skin is warm and dry.  MOUTH and THROAT: Lips are without lesions. Oral mucosa is moist and without lesions. Tongue is normal in shape, size, and color and without lesions RESPIRATORY: Breathing is even &  unlabored, BS CTAB CARDIAC: RRR, no murmur,no extra heart sounds, no edema GI: Abdomen soft, normal BS, no masses, no tenderness NEUROLOGICAL: There is no tremor. Speech is clear. Alert to self, disoriented to time and place. PSYCHIATRIC:  Affect and behavior are appropriate  Labs reviewed: Recent Labs    10/20/18 0055 10/20/18 0433  03/17/19 1922  03/23/19 0117 03/24/19 0512 03/25/19 0346  NA 143 144   < >  --    < > 139 133* 132*  K 3.4* 3.7   < >  --    < > 3.6 4.2 4.1  CL 110 111   < >  --    < > 107 101 97*  CO2 22 21*   < >  --    < > 24 20* 24  GLUCOSE 92 97   < >  --    < > 123* 118* 131*  BUN 17 15   < >  --    < > 31* 38* 42*  CREATININE 1.28* 1.16*   < >  --    < > 1.03* 0.99 0.99  CALCIUM 8.0* 8.5*   < >  --    < > 7.6* 7.9* 8.1*  MG 1.5* 2.0  --  1.7  --   --   --   --    < > = values in this interval not displayed.   Recent Labs    07/26/18 0431 10/20/18 0055 03/17/19 1811  AST 20 19 34  ALT 11 10 17   ALKPHOS 65 64 60  BILITOT 1.3* 0.9 2.4*  PROT 5.7* 6.4* 6.7  ALBUMIN 3.0* 3.6 3.5   Recent Labs    08/04/18 10/20/18 0055 03/17/19 1811   03/23/19 0117 03/24/19 0512 03/25/19 0346  WBC 3.2 3.4* 8.4   < > 6.0 5.3 6.1  NEUTROABS 2 1.8 7.3  --   --   --   --   HGB 10.0* 9.9* 13.3   < > 9.7* 10.5* 10.1*  HCT 29* 31.2* 40.0   < > 27.9* 30.9* 30.1*  MCV  --  96.9 98.5   < > 97.2 98.4 97.4  PLT 148* 118* 122*   < > 90* 80* 121*   < > = values in this interval not displayed.   Lab Results  Component Value Date   TSH 0.923 05/31/2014   Lab Results  Component Value Date   HGBA1C 5.1 03/24/2019   Lab Results  Component Value Date   CHOL 185 03/19/2019   HDL 43 03/19/2019   LDLCALC 113 (H) 03/19/2019   TRIG 146 03/19/2019   CHOLHDL 4.3 03/19/2019     Assessment/Plan  1. Chronic heart failure with preserved ejection fraction (HCC) - no SOB, continue Bisoprolol and Lasix  2. Essential hypertension - well-controlled, continue Bisoprolol, Losartan and Amlodipine  3. Coronary artery disease involving native heart without angina pectoris, unspecified vessel or lesion type - no complaints of chest pains, continue Isosorbide Mononitrate ER and Atorvastatin  4. History of cerebrovascular accident - stable, not on anticoagulation secondary to bleed, continue Atorvastatin, Bisoprolol, Losartan and Amlodipine, continue PT, OT and ST     Family/ staff Communication:  Discussed plan of care with resident.  Labs/tests ordered:  None  Goals of care:  Long-term care    Durenda Age, DNP, FNP-BC [facility] [phone] (Monday-Friday 8:00 a.m. - 5:00 p.m.) [phone] (after hours)

## 2019-06-08 ENCOUNTER — Non-Acute Institutional Stay (SKILLED_NURSING_FACILITY): Payer: Medicare Other | Admitting: Internal Medicine

## 2019-06-08 ENCOUNTER — Encounter: Payer: Self-pay | Admitting: Internal Medicine

## 2019-06-08 DIAGNOSIS — I10 Essential (primary) hypertension: Secondary | ICD-10-CM

## 2019-06-08 DIAGNOSIS — Z8673 Personal history of transient ischemic attack (TIA), and cerebral infarction without residual deficits: Secondary | ICD-10-CM

## 2019-06-08 DIAGNOSIS — N182 Chronic kidney disease, stage 2 (mild): Secondary | ICD-10-CM

## 2019-06-08 DIAGNOSIS — I25119 Atherosclerotic heart disease of native coronary artery with unspecified angina pectoris: Secondary | ICD-10-CM | POA: Diagnosis not present

## 2019-06-08 DIAGNOSIS — D649 Anemia, unspecified: Secondary | ICD-10-CM | POA: Diagnosis not present

## 2019-06-08 DIAGNOSIS — I5032 Chronic diastolic (congestive) heart failure: Secondary | ICD-10-CM

## 2019-06-08 NOTE — Assessment & Plan Note (Addendum)
History is unreliable due to dementia; but she denies any active cardiac symptoms at this time. She is on maintenance Imdur.

## 2019-06-08 NOTE — Patient Instructions (Signed)
See assessment and plan under each diagnosis in the problem list and acutely for this visit 

## 2019-06-08 NOTE — Assessment & Plan Note (Signed)
Neurology follow-up [facility] tomorrow 12/9

## 2019-06-08 NOTE — Assessment & Plan Note (Signed)
Dementia invalidates responses which are negative for any active cardiopulmonary symptoms.  On exam she was lying supine and exhibited no neck vein distention, abnormal breath sounds, significant edema, or hepatojugular reflux.

## 2019-06-08 NOTE — Assessment & Plan Note (Signed)
BP controlled; no change in antihypertensive medications  

## 2019-06-08 NOTE — Progress Notes (Signed)
NURSING HOME LOCATION:  [facility] ROOM NUMBER:  [id number]  CODE STATUS:  DNR  PCP:  Ina Homes, MD  [place] [id number]  This is a nursing facility follow up of chronic medical diagnoses.  Interim medical record and care since last [facility] visit was updated with review of diagnostic studies and change in clinical status since last visit were documented.  HPI: She is a permanent resident of the facility with medical diagnoses of CVA, essential hypertension, dyslipidemia, GERD, colonic polyp, CKD stage III, congestive heart failure, CAD, aortic insufficiency and chronic anemia. She has had coronary artery stenting.  Family history is strongly positive for essential hypertension and stroke.  Review of systems: Dementia invalidated responses.  She cannot provide me with the year, month, reason I was wearing PPE, or the name of the president.  She replied negatively to a full review of systems, stating "I am all right".  Specifically she denied any cardiac or congestive heart failure type symptoms.  She then asked if it were appropriate for her to ask a question but could not complete her thought.  Constitutional: No fever, significant weight change, fatigue  Eyes: No redness, discharge, pain, vision change ENT/mouth: No nasal congestion,  purulent discharge, earache, change in hearing, sore throat  Cardiovascular: No chest pain, palpitations, paroxysmal nocturnal dyspnea, claudication, edema  Respiratory: No cough, sputum production, hemoptysis, DOE, significant snoring, apnea   Gastrointestinal: No heartburn, dysphagia, abdominal pain, nausea /vomiting, rectal bleeding, melena, change in bowels Genitourinary: No dysuria, hematuria, pyuria, incontinence, nocturia Musculoskeletal: No joint stiffness, joint swelling, weakness, pain Dermatologic: No rash, pruritus, change in appearance of skin Neurologic: No dizziness, headache, syncope, seizures, numbness,  tingling Psychiatric: No significant anxiety, depression, insomnia, anorexia Endocrine: No change in hair/skin/nails, excessive thirst, excessive hunger, excessive urination  Hematologic/lymphatic: No significant bruising, lymphadenopathy, abnormal bleeding Allergy/immunology: No itchy/watery eyes, significant sneezing, urticaria, angioedema  Physical exam:  Pertinent or positive findings: She appears her stated age.  Initially she was asleep late in the afternoon.  She was easily arousable.  She obviously has profound visual acuity loss as she turns her head side to side to count fingers.  She has ptosis of the right eye with profound ectropion greater on the left than the right.  Eyebrows are thin.  Her hair is also thin and wispy.  She is edentulous.  Breath sounds are distant.  She exhibits no neck vein distention or hepatojugular reflux.  Pedal pulses are decreased.  Clubbing of the nailbeds is suggested.  She has fusiform changes in the knees.  There is wasting of the gastrocnemius muscles.  She has hyperpigmentation and scarring in an irregular distribution over the shins.  Strength is poor to opposition.  General appearance: Adequately nourished; no acute distress, increased work of breathing is present.   Lymphatic: No lymphadenopathy about the head, neck, axilla. Eyes: There is no scleral icterus. Ears:  External ear exam shows no significant lesions or deformities.   Nose:  External nasal examination shows no deformity or inflammation. Nasal mucosa are pink and moist without lesions, exudates Neck:  No thyromegaly, masses, tenderness noted.    Heart:  No gallop, murmur, click, rub .  Lungs:  without wheezes, rhonchi, rales, rubs. Abdomen: Bowel sounds are normal. Abdomen is soft and nontender with no organomegaly, hernias, masses. GU: Deferred  Extremities:  No cyanosis,  edema  Neurologic exam : Balance, Rhomberg, finger to nose testing could not be completed due to clinical state  No significant  rash.  See summary under each active problem in the Problem List with associated updated therapeutic plan

## 2019-06-08 NOTE — Assessment & Plan Note (Signed)
BMET will be updated as creatinine was 1.2 on 04/08/2019.

## 2019-06-08 NOTE — Assessment & Plan Note (Signed)
04/08/2019 hemoglobin  8.7/hematocrit 26, platelet count 134,000.  No bleeding dyscrasias reported.  CBC will be updated.

## 2019-06-09 ENCOUNTER — Other Ambulatory Visit: Payer: Self-pay

## 2019-06-09 ENCOUNTER — Encounter: Payer: Self-pay | Admitting: Adult Health

## 2019-06-09 ENCOUNTER — Ambulatory Visit (INDEPENDENT_AMBULATORY_CARE_PROVIDER_SITE_OTHER): Payer: Medicare Other | Admitting: Adult Health

## 2019-06-09 VITALS — BP 102/70 | HR 61 | Temp 98.4°F | Ht <= 58 in

## 2019-06-09 DIAGNOSIS — I10 Essential (primary) hypertension: Secondary | ICD-10-CM | POA: Diagnosis not present

## 2019-06-09 DIAGNOSIS — R5381 Other malaise: Secondary | ICD-10-CM

## 2019-06-09 DIAGNOSIS — E785 Hyperlipidemia, unspecified: Secondary | ICD-10-CM | POA: Diagnosis not present

## 2019-06-09 DIAGNOSIS — I610 Nontraumatic intracerebral hemorrhage in hemisphere, subcortical: Secondary | ICD-10-CM

## 2019-06-09 DIAGNOSIS — F0390 Unspecified dementia without behavioral disturbance: Secondary | ICD-10-CM

## 2019-06-09 NOTE — Progress Notes (Signed)
[facility] [address] [place] [address] [phone]       [facility] NOTE  [name] Date of Birth:  1929/02/15 Medical Record Number:  [id number]   Reason for Referral:  hospital stroke follow up    CHIEF COMPLAINT:  Chief Complaint  Patient presents with   Hospitalization Follow-up    Caregiver present. Treatment room. No concerns at this time.     HPI: Jane [name] being seen today for in office hospital follow-up regarding left frontal small periventricular hemorrhage with extensive IVH likely secondary to uncontrolled hypertension on 03/17/2019.  History obtained from chart review. Reviewed all radiology images and labs personally.  [name] is a 85 y.o. female with history of HTN, HLD, previous stroke, CAD, CHF, CKD  presented on 03/17/2019 after being found down with severe HTN, L gaze deviation, not following commands.  Stroke work-up showed left frontal small periventricular hemorrhage with extensive IVH likely secondary to uncontrolled hypertension.  CT head showed large IVH with ventricular dilation, 9 mm left frontal lobe, signs of edema and hematoma along vertex without evidence of fracture.  CTA head/neck no underlying vascular lesion for left hemorrhage but did show arthrosclerosis bilateral ICA bifurcations 60%, 70% or more right V4 and 50 to 70% left V4.  MRI stable IVH with increased ICH with hemorrhage originating from left periventricular white matter, stable ventriculomegaly without midline shift and no evidence of infarct with skull hematoma.  Repeat CT head stable.  2D echo normal EF without cardiac source of emboli.  History of HTN found in hypertensive emergency on admission with BP 234/127 stabilized with Cleviprex and resumed Lasix, isosorbide, losartan and increased dose of zebeta to 10 mg twice daily and added amlodipine 10 mg daily.  LDL 113 and recommended continuation of atorvastatin 40 mg daily.   No evidence or history of DM with A1c 5.2.  Hospital course complicated with E. coli bacteremia with fever treated with antibiotics.  Monitoring of AKI on CKD stage III.  Evidence of thrombocytopenia with monitoring of CBC.  Other stroke risk factors include advanced age, former tobacco use, history of stroke/TIA and CAD.  Other active problems include initial use of c-collar due to fall and removed after negative imaging, hypokalemia and history of temporal arteritis in 2014.  Residual deficits cognitive impairment generalized weakness and dysphagia and was discharged to SNF.  [name] is a 85 year old female who is being seen today for hospital follow-up accompanied by facility secretary who assisted with transportation.  She continues to reside at [facility]  Limited exam and history taking due to underlying dementia.  Currently sitting in wheelchair as she is nonambulatory.  No evidence of residual weakness but does have difficulty lifting legs with slowed movement.  Continues on atorvastatin without myalgias.  Blood pressure today 102/70.  She continues to be seen routinely by facility providers.  No further concerns at this time.      ROS:   14 system review of systems performed and negative with exception of memory loss and confusion  PMH:  Past Medical History:  Diagnosis Date   Anemia    Aortic insufficiency    mild   Arthritis    "arms" (08/09/2015)   CAD (coronary artery disease)    cath 08/09/2014 95% stenosis in prox to mid RCA s/p DES, 80-90% prox OM2, 50% distal LAD   CHF (congestive heart failure) (HCC)    CKD (chronic kidney disease) stage 3,  GFR 30-59 ml/min    Colon polyp    2009 colonoscopy, not retrieved for pathology   Dyspnea    Ectropion of left lower eyelid    GERD (gastroesophageal reflux disease) 2009   EGD with benign gastric polyp too   Hyperlipidemia    Hypertension    Pneumonia    history of   Stroke ([place]) 1975   Vitamin D deficiency      PSH:  Past Surgical History:  Procedure Laterality Date   ABDOMINAL HYSTERECTOMY     APPENDECTOMY     ARTERY BIOPSY Left 12/16/2012   Procedure: BIOPSY TEMPORAL ARTERY;  Surgeon: Mal Misty, MD;  Location: [facility];  Service: Vascular;  Laterality: Left;   CARDIAC CATHETERIZATION     LEFT HEART CATHETERIZATION WITH CORONARY ANGIOGRAM N/A 08/09/2014   Procedure: LEFT HEART CATHETERIZATION WITH CORONARY ANGIOGRAM;  Surgeon: Peter M Martinique, MD;  Location: [facility] CATH LAB;  Service: Cardiovascular;  Laterality: N/A;   PERCUTANEOUS CORONARY STENT INTERVENTION (PCI-S)  08/09/2014   Procedure: PERCUTANEOUS CORONARY STENT INTERVENTION (PCI-S);  Surgeon: Peter M Martinique, MD;  Location: [facility] CATH LAB;  Service: Cardiovascular;;   TONSILLECTOMY      Social History:  Social History   Socioeconomic History   Marital status: Widowed    Spouse name: Not on file   Number of children: Not on file   Years of education: Not on file   Highest education level: Not on file  Occupational History   Not on file  Social Needs   Financial resource strain: Not on file   Food insecurity    Worry: Not on file    Inability: Not on file   Transportation needs    Medical: Not on file    Non-medical: Not on file  Tobacco Use   Smoking status: Former Smoker    Types: Cigarettes    Quit date: 07/02/1979    Years since quitting: 39.9   Smokeless tobacco: Former Systems developer   Tobacco comment: Started in teenage years - Quit 1980  Substance and Sexual Activity   Alcohol use: No    Alcohol/week: 0.0 standard drinks    Comment: Quit alcohol 1975-76   Drug use: No   Sexual activity: Never  Lifestyle   Physical activity    Days per week: Not on file    Minutes per session: Not on file   Stress: Not on file  Relationships   Social connections    Talks on phone: Not on file    Gets together: Not on file    Attends religious service: Not on file    Active member of club or organization: Not on  file    Attends meetings of clubs or organizations: Not on file    Relationship status: Not on file   Intimate partner violence    Fear of current or ex partner: Not on file    Emotionally abused: Not on file    Physically abused: Not on file    Forced sexual activity: Not on file  Other Topics Concern   Not on file  Social History Narrative   Ms. Cali lives alone in an apartment in [place]. Her nephew, and her son and his wife live nearby and visit and help often. She is independent in ADLs but depends on family for IADLs. She is ambulatory in her apartment without a walker but requires this when she leaves her apartment.     Family History:  Family History  Problem Relation Age  of Onset   Stroke Mother    Hypertension Mother    Stroke Father    Hypertension Father    Diabetes Sister     Medications:   Current Outpatient Medications on File Prior to Visit  Medication Sig Dispense Refill   acetaminophen (TYLENOL) 325 MG tablet Take 650 mg by mouth every 6 (six) hours as needed for mild pain or headache.      albuterol (VENTOLIN HFA) 108 (90 Base) MCG/ACT inhaler Inhale 1 puff into the lungs every 6 (six) hours as needed for wheezing or shortness of breath.     Amino Acids-Protein Hydrolys (FEEDING SUPPLEMENT, PRO-STAT SUGAR FREE 64,) LIQD Place 30 mLs into feeding tube 2 (two) times daily. 887 mL 0   amLODipine (NORVASC) 10 MG tablet Take 1 tablet (10 mg total) by mouth daily. 30 tablet 0   atorvastatin (LIPITOR) 40 MG tablet Take 1 tablet (40 mg total) by mouth daily at 6 PM. 30 tablet 0   bisacodyl (DULCOLAX) 10 MG suppository If not relieved by MOM, give 10 mg Bisacodyl suppositiory rectally X 1 dose in 24 hours as needed (Do not use constipation standing orders for residents with renal failure/CFR less than 30. Contact MD for orders) (Physician Order)     bisoprolol (ZEBETA) 10 MG tablet Place 1 tablet (10 mg total) into feeding tube 2 (two) times daily. 60  tablet 0   feeding supplement, ENSURE ENLIVE, (ENSURE ENLIVE) LIQD Take 237 mLs by mouth 2 (two) times daily between meals. 237 mL 12   furosemide (LASIX) 20 MG tablet TAKE 1 TABLET(20 MG) BY MOUTH DAILY 90 tablet 1   isosorbide mononitrate (IMDUR) 60 MG 24 hr tablet Take 1 tablet (60 mg total) by mouth daily. 30 tablet 0   losartan (COZAAR) 100 MG tablet TAKE 1 TABLET(100 MG) BY MOUTH DAILY 90 tablet 1   magnesium hydroxide (MILK OF MAGNESIA) 400 MG/5ML suspension Take 30 mLs by mouth daily as needed for mild constipation.      Nutritional Supplement LIQD Take 120 mLs by mouth 3 (three) times daily. MedPass     pantoprazole sodium (PROTONIX) 40 mg/20 mL PACK Take 20 mLs (40 mg total) by mouth daily. 30 mL 0   polyvinyl alcohol (ARTIFICIAL TEARS) 1.4 % ophthalmic solution Place 1 drop into both eyes 2 (two) times daily. For dry eyes 15 mL 0   senna (SENOKOT) 8.6 MG TABS tablet Take 2 tablets by mouth at bedtime.     Sodium Phosphates (RA SALINE ENEMA RE) If not relieved by Biscodyl suppository, give disposable Saline Enema rectally X 1 dose/24 hrs as needed (Do not use constipation standing orders for residents with renal failure/CFR less than 30. Contact MD for orders)(Physician Or     No current facility-administered medications on file prior to visit.     Allergies:  No Known Allergies   Physical Exam  Vitals:   06/09/19 0936  BP: 102/70  Pulse: 61  Temp: 98.4 F (36.9 C)  TempSrc: Oral  Height: 4\' 10"  (1.473 m)   Body mass index is 28.22 kg/m. No exam data present   General: Frail pleasant elderly African-American female, seated, in no evident distress Head: head normocephalic and atraumatic.   Neck: supple with no carotid or supraclavicular bruits Cardiovascular: regular rate and rhythm, no murmurs Musculoskeletal: no deformity Skin:  no rash/petichiae Vascular:  Normal pulses all extremities   Neurologic Exam Mental Status: Appears fatigued but easily  arousable.   Slurred speech but unsure if  baseline.  Slowed/delayed mentation and response.  Disoriented to place, time and age but oriented to name. Recent and remote memory impaired. Attention span, concentration and fund of knowledge impaired. Mood and affect appropriate and cooperative with exam.  Cranial Nerves: Fundoscopic exam deferred due to difficulty visualizing with chronic eye conditions.  Bilateral lower eyelid ectropion with mild bilateral eye opening apraxia. Extraocular movements full without nystagmus.  Blinking to visual threat bilaterally.  Hearing impaired. Facial sensation intact. Face, tongue, palate moves normally and symmetrically.  Motor: Normal bulk and tone.  Generalized weakness 4/5 able to resist moderately against examiner but slower movements with bilateral lower extremity.  Possible truncal weakness leaning towards left side Sensory.: intact to touch , pinprick , position and vibratory sensation.  Coordination: Rapid alternating movements delayed. Finger-to-nose performed accurately bilaterally and heel-to-shin difficulty performing likely due to weakness and cognition. Gait and Station: Deferred as patient nonambulatory Reflexes: 1+ and symmetric. Toes downgoing.     NIHSS  6 Modified Rankin  4    Diagnostic Data (Labs, Imaging, Testing)  Ct Head Wo Contrast 03/17/2019   1. Large intraventricular hemorrhage with ventricular dilatation. 2. 9 millimeter intraparenchymal hemorrhage within the frontal lobe periventricular white matter, adjacent to the body of the LEFT LATERAL ventricle. 3. Atrophy and small vessel disease. 4. Significant scalp edema and hematoma along the vertex, not associated with underlying fracture.   CT Head WO Contrast 03/21/19 IMPRESSION: 1. Unchanged appearance of large volume intraventricular and small volume extra-axial subarachnoid hemorrhage. No new hemorrhagic site. 2. Slight decrease in size of the lateral ventricles compared to the  prior study. 3. Large scalp vertex hematoma.  Ct Cervical Spine Wo Contrast 03/17/2019   Significant stable degenerative changes throughout the cervical spine. No evidence for acute abnormality.   Ct Angio Head W Or Wo Contrast Ct Angio Neck W Or Wo Contrast 03/18/2019 No evidence of underlying vascular lesion in the region of the deep brain hemorrhage on the left. Advanced aortic atherosclerosis. Atherosclerotic disease at both carotid bifurcations with maximal stenosis of 60% on each side. 30-50% stenosis at both vertebral artery origins. 50% or less stenosis in both carotid siphon regions. 70% or greater stenosis of the right vertebral artery V4 segment. 50-70% stenosis of the left vertebral artery V4 segment.   Mr Brain Wo Contrast 03/19/2019 1. Stable large volume of intraventricular hemorrhage, although superimposed subarachnoid hemorrhage volume appears increased from the CT on 03/17/2019 suggesting a degree of continued bleeding.  2. Stable ventriculomegaly, with mild transependymal edema. No significant midline shift. Basilar cisterns remain patent.  3. As before, the hemorrhage might have originated in the left periventricular white matter. No superimposed acute infarct. No underlying lesion is identified in the absence of IV contrast.  4. Scalp hematoma at the vertex re-identified.    2D Echocardiogram 1. Left ventricular ejection fraction, by visual estimation, is 60 to 65%. The left ventricle has normal function. Normal left ventricular size. Left ventricular septal wall thickness was mildly increased. Mildly increased left ventricular posterior wall thickness. There is mildly increased left ventricular hypertrophy. 2. Elevated left ventricular end-diastolic pressure. 3. Left ventricular diastolic Doppler parameters are consistent with pseudonormalization pattern of LV diastolic filling. 4. Global right ventricle has normal systolic function.The right ventricular size is  normal. No increase in right ventricular wall thickness. 5. Left atrial size was normal. 6. Right atrial size was normal. 7. The mitral valve is normal in structure. Trace mitral valve regurgitation. No evidence of mitral stenosis. 8. The tricuspid  valve is normal in structure. Tricuspid valve regurgitation is trivial. 9. The aortic valve The aortic valve is normal in structure. Aortic valve regurgitation is mild by color flow Doppler. Structurally normal aortic valve, with no evidence of sclerosis or stenosis. 10. The pulmonic valve was normal in structure. Pulmonic valve regurgitation is not visualized by color flow Doppler. 11. Mildly elevated pulmonary artery systolic pressure. 12. The inferior vena cava is normal in size with greater than 50% respiratory variability, suggesting right atrial pressure of 3 mmHg.  Dg Chest Port 1 View 03/17/2019 Cardiomegaly with mild interstitial edema.   DG Chest - Portable 1 View 03/20/2019 1. Largely resolved vascular congestion and interstitial edema.    ASSESSMENT: Jane [name] is a 85 y.o. year old female presented with after being found down with severe HTN, left gaze deviation and not following commands on 03/17/2019 with stroke work-up revealing left frontal small periventricular hemorrhage with extensive IVH likely secondary to uncontrolled HTN. Vascular risk factors include HTN, HLD, prior stroke, CAD, CHF and CKD.  Continues to reside at [facility] and difficulty with full history taking and exam due to underlying dementia.  Compared to assessment during hospitalization, overall improvement of weakness with generalized weakness appearing more related to age, deconditioning and cognition    PLAN:  1. Left frontal hemorrhage: Previously on aspirin with history of CAD post stent but discontinued due to hemorrhage.  Recommend repeating CT head to assess for resolution and possible restart of aspirin 81 mg daily for  secondary stroke prevention as blood pressure greatly improved.  Continue Lipitor for secondary stroke prevention. Maintain strict control of hypertension with blood pressure goal below 130/90, diabetes with hemoglobin A1c goal below 6.5% and cholesterol with LDL cholesterol (bad cholesterol) goal below 70 mg/dL.  I also advised the patient to eat a healthy diet with plenty of whole grains, cereals, fruits and vegetables, exercise regularly with at least 30 minutes of continuous activity daily and maintain ideal body weight.  Recommend continuation of working with PT for possible ongoing improvement of generalized weakness and to avoid further deconditioning 2. HTN: Advised to continue current treatment regimen.  Today's BP 102/70.  Advised to continue to monitor at home along with continued follow-up with PCP for management 3. HLD: Advised to continue current treatment regimen along with continued follow-up with PCP for future prescribing and monitoring of lipid panel 4. Dementia: Likely underlying dementia and unable to determine if worsening post stroke.  Continues to be monitored by facility providers    Follow up in 3 months or call earlier if needed   Greater than 50% of time during this 45 minute visit was spent on counseling, explanation of diagnosis of left frontal hemorrhage, reviewing risk factor management of HTN and HLD, planning of further management along with potential future management, and discussion with patient answering all questions    Frann Rider, [facility] [address] [place], [place] [address]  Phone [phone] Fax [phone] Note: This document was prepared with digital dictation and possible smart phrase technology. Any transcriptional errors that result from this process are unintentional.

## 2019-06-09 NOTE — Patient Instructions (Signed)
Repeat CT head to assess for resolution of prior bleed and potentially restart aspirin 81 mg for secondary stroke prevention with extensive cardiac history  Continue Lipitor for secondary stroke prevention  Continue to participate in physical therapy for ongoing improvement  Continue to follow up with PCP regarding cholesterol and blood pressure management   Continue to monitor blood pressure at home  Maintain strict control of hypertension with blood pressure goal below 130/90, diabetes with hemoglobin A1c goal below 6.5% and cholesterol with LDL cholesterol (bad cholesterol) goal below 70 mg/dL. I also advised the patient to eat a healthy diet with plenty of whole grains, cereals, fruits and vegetables, exercise regularly and maintain ideal body weight.  Followup in the future with me in 3 months or call earlier if needed       Thank you for coming to see [place] at [facility]. I hope we have been able to provide you high quality care today.  You may receive a patient satisfaction survey over the next few weeks. We would appreciate your feedback and comments so that we may continue to improve ourselves and the health of our patients.

## 2019-06-10 LAB — CBC AND DIFFERENTIAL
HCT: 30 — AB (ref 36–46)
Hemoglobin: 10.3 — AB (ref 12.0–16.0)
Platelets: 189 (ref 150–399)
WBC: 5.9

## 2019-06-10 LAB — CBC: RBC: 3.25 — AB (ref 3.87–5.11)

## 2019-06-10 NOTE — Progress Notes (Signed)
I agree with the above plan 

## 2019-06-11 LAB — COMPREHENSIVE METABOLIC PANEL
Calcium: 8.7 (ref 8.7–10.7)
GFR calc Af Amer: 77.39
GFR calc non Af Amer: 66.77

## 2019-06-11 LAB — BASIC METABOLIC PANEL
BUN: 20 (ref 4–21)
CO2: 29 — AB (ref 13–22)
Chloride: 104 (ref 99–108)
Creatinine: 0.8 (ref 0.5–1.1)
Glucose: 91
Potassium: 3.4 (ref 3.4–5.3)
Sodium: 146 (ref 137–147)

## 2019-06-11 LAB — CBC: RBC: 3.11 — AB (ref 3.87–5.11)

## 2019-06-11 LAB — CBC AND DIFFERENTIAL
HCT: 29 — AB (ref 36–46)
Hemoglobin: 9.9 — AB (ref 12.0–16.0)
Platelets: 143 — AB (ref 150–399)
WBC: 5.9

## 2019-06-17 ENCOUNTER — Non-Acute Institutional Stay (SKILLED_NURSING_FACILITY): Payer: Medicare Other | Admitting: Adult Health

## 2019-06-17 ENCOUNTER — Encounter: Payer: Self-pay | Admitting: Adult Health

## 2019-06-17 DIAGNOSIS — I10 Essential (primary) hypertension: Secondary | ICD-10-CM | POA: Diagnosis not present

## 2019-06-17 DIAGNOSIS — Z7189 Other specified counseling: Secondary | ICD-10-CM | POA: Insufficient documentation

## 2019-06-17 DIAGNOSIS — I5032 Chronic diastolic (congestive) heart failure: Secondary | ICD-10-CM

## 2019-06-17 DIAGNOSIS — I25119 Atherosclerotic heart disease of native coronary artery with unspecified angina pectoris: Secondary | ICD-10-CM

## 2019-06-17 NOTE — Progress Notes (Signed)
Location:  [place] Room Number: 122/A Place of Service:  SNF (31) Provider:  Durenda Age, DNP, FNP-BC  Patient Care Team: Ina Homes, MD as PCP - General (Internal Medicine)  Extended Emergency Contact Information Primary Emergency Contact: [name] Mobile Phone: [phone] Relation: Nephew Secondary Emergency Contact: Gilberto Better Mobile Phone: [phone] Relation: Nephew Interpreter needed? No  Code Status:  DNR  Goals of care: Advanced Directive information Advanced Directives 06/17/2019  Does Patient Have a Medical Advance Directive? Yes  Type of Advance Directive Out of facility DNR (pink MOST or yellow form)  Does patient want to make changes to medical advance directive? No - Patient declined  Copy of [place] in Chart? -  Would patient like information on creating a medical advance directive? -  Pre-existing out of facility DNR order (yellow form or pink MOST form) Yellow form placed in chart (order not valid for inpatient use)     Chief Complaint  Patient presents with  . Advanced Directive    Advance Care Plan Meeting     HPI:  Pt is a 85 y.o. female who is for advance care plan meeting. Nephew was called but declined to be on the phone because he has a doctor's appointment. Social worker will mail him a copy of the minutes. Resident declined the meeting invitation. Meeting was attended by NP, social worker and MDS coordinator. Code status remains to be DNR. She is hard of hearing and will be put on the list for in-house audiology consult. She complains of having poor vision and will be put on the list for ophthalmology consult, as well. She had a fall incident on 06/09/19. No noted injury from fall, except for a small skin tear to left shin. Discussed medications, vital signs and weights. She participates in restorative program, eating/swallowing. The meeting lasted for 25 minutes.   Past Medical  History:  Diagnosis Date  . Anemia   . Aortic insufficiency    mild  . Arthritis    "arms" (08/09/2015)  . CAD (coronary artery disease)    cath 08/09/2014 95% stenosis in prox to mid RCA s/p DES, 80-90% prox OM2, 50% distal LAD  . CHF (congestive heart failure) ([place])   . CKD (chronic kidney disease) stage 3, GFR 30-59 ml/min   . Colon polyp    2009 colonoscopy, not retrieved for pathology  . Dyspnea   . Ectropion of left lower eyelid   . GERD (gastroesophageal reflux disease) 2009   EGD with benign gastric polyp too  . Hyperlipidemia   . Hypertension   . Pneumonia    history of  . Stroke ([place]) 1975  . Vitamin D deficiency    Past Surgical History:  Procedure Laterality Date  . ABDOMINAL HYSTERECTOMY    . APPENDECTOMY    . ARTERY BIOPSY Left 12/16/2012   Procedure: BIOPSY TEMPORAL ARTERY;  Surgeon: Mal Misty, MD;  Location: [place];  Service: Vascular;  Laterality: Left;  . CARDIAC CATHETERIZATION    . LEFT HEART CATHETERIZATION WITH CORONARY ANGIOGRAM N/A 08/09/2014   Procedure: LEFT HEART CATHETERIZATION WITH CORONARY ANGIOGRAM;  Surgeon: Peter M Martinique, MD;  Location: [facility] CATH LAB;  Service: Cardiovascular;  Laterality: N/A;  . PERCUTANEOUS CORONARY STENT INTERVENTION (PCI-S)  08/09/2014   Procedure: PERCUTANEOUS CORONARY STENT INTERVENTION (PCI-S);  Surgeon: Peter M Martinique, MD;  Location: [facility] CATH LAB;  Service: Cardiovascular;;  . TONSILLECTOMY      No Known Allergies  Outpatient Encounter Medications  as of 06/17/2019  Medication Sig  . acetaminophen (TYLENOL) 325 MG tablet Take 650 mg by mouth every 6 (six) hours as needed for mild pain or headache.   . albuterol (VENTOLIN HFA) 108 (90 Base) MCG/ACT inhaler Inhale 1 puff into the lungs every 6 (six) hours as needed for wheezing or shortness of breath.  . Amino Acids-Protein Hydrolys (FEEDING SUPPLEMENT, PRO-STAT SUGAR FREE 64,) LIQD Place 30 mLs into feeding tube 2 (two) times daily.  Marland Kitchen amLODipine (NORVASC) 10 MG tablet  Take 1 tablet (10 mg total) by mouth daily.  Marland Kitchen atorvastatin (LIPITOR) 40 MG tablet Take 1 tablet (40 mg total) by mouth daily at 6 PM.  . bisacodyl (DULCOLAX) 10 MG suppository If not relieved by MOM, give 10 mg Bisacodyl suppositiory rectally X 1 dose in 24 hours as needed (Do not use constipation standing orders for residents with renal failure/CFR less than 30. Contact MD for orders) (Physician Order)  . bisoprolol (ZEBETA) 10 MG tablet Place 1 tablet (10 mg total) into feeding tube 2 (two) times daily.  . feeding supplement, ENSURE ENLIVE, (ENSURE ENLIVE) LIQD Take 237 mLs by mouth 2 (two) times daily between meals.  . furosemide (LASIX) 20 MG tablet TAKE 1 TABLET(20 MG) BY MOUTH DAILY  . isosorbide mononitrate (IMDUR) 60 MG 24 hr tablet Take 1 tablet (60 mg total) by mouth daily.  Marland Kitchen losartan (COZAAR) 100 MG tablet TAKE 1 TABLET(100 MG) BY MOUTH DAILY  . magnesium hydroxide (MILK OF MAGNESIA) 400 MG/5ML suspension Take 30 mLs by mouth daily as needed for mild constipation.   . NON FORMULARY Magic cup once daily to help prevent weight loss  . Nutritional Supplement LIQD Take 120 mLs by mouth 3 (three) times daily. MedPass  . pantoprazole sodium (PROTONIX) 40 mg/20 mL PACK Take 20 mLs (40 mg total) by mouth daily.  . polyvinyl alcohol (ARTIFICIAL TEARS) 1.4 % ophthalmic solution Place 1 drop into both eyes 2 (two) times daily. For dry eyes  . senna (SENOKOT) 8.6 MG TABS tablet Take 2 tablets by mouth at bedtime.  . Sodium Phosphates (RA SALINE ENEMA RE) If not relieved by Biscodyl suppository, give disposable Saline Enema rectally X 1 dose/24 hrs as needed (Do not use constipation standing orders for residents with renal failure/CFR less than 30. Contact MD for orders)(Physician Or   No facility-administered encounter medications on file as of 06/17/2019.    Review of Systems  Unable to obtain due to dementia.    Immunization History  Administered Date(s) Administered  . Influenza Whole  03/28/2008  . Influenza,inj,Quad PF,6+ Mos 08/09/2013, 03/30/2015, 03/24/2017  . Influenza-Unspecified 03/31/2019  . Pneumococcal Conjugate-13 12/08/2014  . Pneumococcal Polysaccharide-23 09/18/2012  . Tdap 02/01/2016, 06/21/2018   Pertinent  Health Maintenance Due  Topic Date Due  . INFLUENZA VACCINE  Completed  . DEXA SCAN  Completed  . PNA vac Low Risk Adult  Completed   Fall Risk  03/19/2018 10/16/2017 09/26/2017 07/14/2017 04/21/2017  Falls in the past year? No Yes No No No  Number falls in past yr: - 1 - - -  Injury with Fall? - Yes - - -  Risk Factor Category  - High Fall Risk - - -  Risk for fall due to : - Impaired mobility;Impaired balance/gait Impaired balance/gait;Impaired mobility - -  Follow up - Falls prevention discussed - - -  Comment - - - - -     Vitals:   06/17/19 1041  BP: (!) 140/59  Pulse: 73  Resp: (!) 22  Temp: 97.9 F (36.6 C)  TempSrc: Oral  SpO2: 95%  Weight: 135 lb (61.2 kg)  Height: 4\' 10"  (1.473 m)   Body mass index is 28.22 kg/m.  Physical Exam  GENERAL APPEARANCE: Well nourished. In no acute distress. Normal body habitus SKIN:  Skin is warm and dry.  MOUTH and THROAT: Lips are without lesions. Oral mucosa is moist and without lesions. Tongue is normal in shape, size, and color and without lesions RESPIRATORY: Breathing is even & unlabored, BS CTAB CARDIAC: RRR, no murmur,no extra heart sounds, no edema GI: Abdomen soft, normal BS, no masses, no tenderness EXTREMITIES:  Able to move X 4 extremities NEUROLOGICAL: There is no tremor. Speech is clear. Alert to self, disoriented to time and place. PSYCHIATRIC:  Affect and behavior are appropriate  Labs reviewed: Recent Labs    10/20/18 0055 10/20/18 0433 03/17/19 1922 03/23/19 0117 03/24/19 0512 03/25/19 0346 04/08/19 0000  NA 143 144  --  139 133* 132* 145  K 3.4* 3.7  --  3.6 4.2 4.1 3.7  CL 110 111  --  107 101 97* 105  CO2 22 21*  --  24 20* 24 25*  GLUCOSE 92 97  --  123*  118* 131*  --   BUN 17 15  --  31* 38* 42* 43*  CREATININE 1.28* 1.16*  --  1.03* 0.99 0.99 1.2*  CALCIUM 8.0* 8.5*  --  7.6* 7.9* 8.1* 8.7  MG 1.5* 2.0 1.7  --   --   --   --    Recent Labs    07/26/18 0431 10/20/18 0055 03/17/19 1811  AST 20 19 34  ALT 11 10 17   ALKPHOS 65 64 60  BILITOT 1.3* 0.9 2.4*  PROT 5.7* 6.4* 6.7  ALBUMIN 3.0* 3.6 3.5   Recent Labs    10/20/18 0055 03/17/19 1811 03/23/19 0117 03/24/19 0512 03/25/19 0346 04/08/19 0000  WBC 3.4* 8.4 6.0 5.3 6.1 6.7  NEUTROABS 1.8 7.3  --   --   --  5  HGB 9.9* 13.3 9.7* 10.5* 10.1* 8.7*  HCT 31.2* 40.0 27.9* 30.9* 30.1* 26*  MCV 96.9 98.5 97.2 98.4 97.4  --   PLT 118* 122* 90* 80* 121* 134*   Lab Results  Component Value Date   TSH 0.923 05/31/2014   Lab Results  Component Value Date   HGBA1C 5.1 03/24/2019   Lab Results  Component Value Date   CHOL 185 03/19/2019   HDL 43 03/19/2019   LDLCALC 113 (H) 03/19/2019   TRIG 146 03/19/2019   CHOLHDL 4.3 03/19/2019    Assessment/Plan  1. Advance care planning -Discussed code status, medications, vital signs and weights  2. Essential hypertension -Stable, continue amlodipine, bisoprolol and losartan  3. Coronary artery disease involving native coronary artery of native heart with angina pectoris ([place]) -Denies chest pain, continue atorvastatin and isosorbide mononitrate  4. Chronic heart failure with preserved ejection fraction (HCC) -  No SOB, continue bisoprolol and Lasix  Family/ staff Communication:  Discussed plan of care with IDT.  Labs/tests ordered:   None  Goals of care:   Long-term care.   Durenda Age, DNP, FNP-BC [facility] and Adult Medicine [phone] (Monday-Friday 8:00 a.m. - 5:00 p.m.) [phone] (after hours)

## 2019-06-21 ENCOUNTER — Non-Acute Institutional Stay (SKILLED_NURSING_FACILITY): Payer: Medicare Other | Admitting: Adult Health

## 2019-06-21 ENCOUNTER — Encounter: Payer: Self-pay | Admitting: Adult Health

## 2019-06-21 DIAGNOSIS — R634 Abnormal weight loss: Secondary | ICD-10-CM | POA: Diagnosis not present

## 2019-06-21 DIAGNOSIS — R630 Anorexia: Secondary | ICD-10-CM

## 2019-06-21 NOTE — Progress Notes (Signed)
Location:  [place] Room Number: 122/A Place of Service:  SNF (31) Provider:  Durenda Age, DNP, FNP-BC  Patient Care Team: Ina Homes, MD as PCP - General (Internal Medicine)  Extended Emergency Contact Information Primary Emergency Contact: [name] Mobile Phone: [phone] Relation: Nephew Secondary Emergency Contact: Gilberto Better Mobile Phone: [phone] Relation: Nephew Interpreter needed? No  Code Status:  DNR  Goals of care: Advanced Directive information Advanced Directives 06/21/2019  Does Patient Have a Medical Advance Directive? Yes  Type of Advance Directive Out of facility DNR (pink MOST or yellow form)  Does patient want to make changes to medical advance directive? No - Patient declined  Copy of [place] in Chart? -  Would patient like information on creating a medical advance directive? -  Pre-existing out of facility DNR order (yellow form or pink MOST form) -     Chief Complaint  Patient presents with  . Acute Visit    Poor Appetite     HPI:  Pt is a 85 y.o. female seen today for poor appetite. She participates in restorative eating/swallowing by performing self-feeding with set up and verbal cues. She consumes 50-75% of her meals. Latest weight is 124 lbs, has been losing weight with the admission weight 145.4 lbs. Talked to nephew, [name], and discussed weight loss. He is open to tube feedings after all interventions has been exhausted. Discussed that Remeron will be started to increase appetite to which he agreed. She has PMH of CHF, CKD stage III, hypertension, hyperlipidemia, aortic insufficiency and CAD.   Past Medical History:  Diagnosis Date  . Anemia   . Aortic insufficiency    mild  . Arthritis    "arms" (08/09/2015)  . CAD (coronary artery disease)    cath 08/09/2014 95% stenosis in prox to mid RCA s/p DES, 80-90% prox OM2, 50% distal LAD  . CHF (congestive heart  failure) ([place])   . CKD (chronic kidney disease) stage 3, GFR 30-59 ml/min   . Colon polyp    2009 colonoscopy, not retrieved for pathology  . Dyspnea   . Ectropion of left lower eyelid   . GERD (gastroesophageal reflux disease) 2009   EGD with benign gastric polyp too  . Hyperlipidemia   . Hypertension   . Pneumonia    history of  . Stroke ([place]) 1975  . Vitamin D deficiency    Past Surgical History:  Procedure Laterality Date  . ABDOMINAL HYSTERECTOMY    . APPENDECTOMY    . ARTERY BIOPSY Left 12/16/2012   Procedure: BIOPSY TEMPORAL ARTERY;  Surgeon: Mal Misty, MD;  Location: [place];  Service: Vascular;  Laterality: Left;  . CARDIAC CATHETERIZATION    . LEFT HEART CATHETERIZATION WITH CORONARY ANGIOGRAM N/A 08/09/2014   Procedure: LEFT HEART CATHETERIZATION WITH CORONARY ANGIOGRAM;  Surgeon: Peter M Martinique, MD;  Location: [facility] CATH LAB;  Service: Cardiovascular;  Laterality: N/A;  . PERCUTANEOUS CORONARY STENT INTERVENTION (PCI-S)  08/09/2014   Procedure: PERCUTANEOUS CORONARY STENT INTERVENTION (PCI-S);  Surgeon: Peter M Martinique, MD;  Location: [facility] CATH LAB;  Service: Cardiovascular;;  . TONSILLECTOMY      No Known Allergies  Outpatient Encounter Medications as of 06/21/2019  Medication Sig  . acetaminophen (TYLENOL) 325 MG tablet Take 650 mg by mouth every 6 (six) hours as needed for mild pain or headache.   . albuterol (VENTOLIN HFA) 108 (90 Base) MCG/ACT inhaler Inhale 1 puff into the lungs every 6 (six) hours as  needed for wheezing or shortness of breath.  . Amino Acids-Protein Hydrolys (FEEDING SUPPLEMENT, PRO-STAT SUGAR FREE 64,) LIQD Place 30 mLs into feeding tube 2 (two) times daily.  Marland Kitchen amLODipine (NORVASC) 10 MG tablet Take 1 tablet (10 mg total) by mouth daily.  Marland Kitchen atorvastatin (LIPITOR) 40 MG tablet Take 1 tablet (40 mg total) by mouth daily at 6 PM.  . bisacodyl (DULCOLAX) 10 MG suppository If not relieved by MOM, give 10 mg Bisacodyl suppositiory rectally X 1 dose in 24  hours as needed (Do not use constipation standing orders for residents with renal failure/CFR less than 30. Contact MD for orders) (Physician Order)  . bisoprolol (ZEBETA) 10 MG tablet Place 1 tablet (10 mg total) into feeding tube 2 (two) times daily.  . feeding supplement, ENSURE ENLIVE, (ENSURE ENLIVE) LIQD Take 237 mLs by mouth 2 (two) times daily between meals.  . furosemide (LASIX) 20 MG tablet TAKE 1 TABLET(20 MG) BY MOUTH DAILY  . isosorbide mononitrate (IMDUR) 60 MG 24 hr tablet Take 1 tablet (60 mg total) by mouth daily.  Marland Kitchen losartan (COZAAR) 100 MG tablet TAKE 1 TABLET(100 MG) BY MOUTH DAILY  . magnesium hydroxide (MILK OF MAGNESIA) 400 MG/5ML suspension Take 30 mLs by mouth daily as needed for mild constipation.   . NON FORMULARY Magic cup once daily to help prevent weight loss  . Nutritional Supplement LIQD Take 120 mLs by mouth 3 (three) times daily. MedPass  . pantoprazole sodium (PROTONIX) 40 mg/20 mL PACK Take 20 mLs (40 mg total) by mouth daily.  . polyvinyl alcohol (ARTIFICIAL TEARS) 1.4 % ophthalmic solution Place 1 drop into both eyes 2 (two) times daily. For dry eyes  . senna (SENOKOT) 8.6 MG TABS tablet Take 2 tablets by mouth at bedtime.  . Sodium Phosphates (RA SALINE ENEMA RE) If not relieved by Biscodyl suppository, give disposable Saline Enema rectally X 1 dose/24 hrs as needed (Do not use constipation standing orders for residents with renal failure/CFR less than 30. Contact MD for orders)(Physician Or   No facility-administered encounter medications on file as of 06/21/2019.    Review of Systems  Unable to obtain due to dementia   Immunization History  Administered Date(s) Administered  . Influenza Whole 03/28/2008  . Influenza,inj,Quad PF,6+ Mos 08/09/2013, 03/30/2015, 03/24/2017  . Influenza-Unspecified 03/31/2019  . Pneumococcal Conjugate-13 12/08/2014  . Pneumococcal Polysaccharide-23 09/18/2012  . Tdap 02/01/2016, 06/21/2018   Pertinent  Health  Maintenance Due  Topic Date Due  . INFLUENZA VACCINE  Completed  . DEXA SCAN  Completed  . PNA vac Low Risk Adult  Completed   Fall Risk  03/19/2018 10/16/2017 09/26/2017 07/14/2017 04/21/2017  Falls in the past year? No Yes No No No  Number falls in past yr: - 1 - - -  Injury with Fall? - Yes - - -  Risk Factor Category  - High Fall Risk - - -  Risk for fall due to : - Impaired mobility;Impaired balance/gait Impaired balance/gait;Impaired mobility - -  Follow up - Falls prevention discussed - - -  Comment - - - - -     Vitals:   06/21/19 1209  BP: 122/62  Pulse: 82  Resp: 17  Temp: 98.2 F (36.8 C)  TempSrc: Oral  SpO2: 95%  Weight: 124 lb (56.2 kg)  Height: 4\' 10"  (1.473 m)   Body mass index is 25.92 kg/m.  Physical Exam  GENERAL APPEARANCE: Well nourished. In no acute distress. Normal body habitus SKIN:  Skin is  warm and dry.  MOUTH and THROAT: Lips are without lesions. Oral mucosa is moist and without lesions. Tongue is normal in shape, size, and color and without lesions RESPIRATORY: Breathing is even & unlabored, BS CTAB CARDIAC: RRR, no murmur,no extra heart sounds, no edema GI: Abdomen soft, normal BS, no masses, no tenderness NEUROLOGICAL: There is no tremor. Speech is clear. Alert to self, disoriented to time and place. PSYCHIATRIC:  Affect and behavior are appropriate  Labs reviewed: Recent Labs    10/20/18 0055 10/20/18 0433 03/17/19 1922 03/23/19 0117 03/24/19 0512 03/25/19 0346 04/08/19 0000  NA 143 144  --  139 133* 132* 145  K 3.4* 3.7  --  3.6 4.2 4.1 3.7  CL 110 111  --  107 101 97* 105  CO2 22 21*  --  24 20* 24 25*  GLUCOSE 92 97  --  123* 118* 131*  --   BUN 17 15  --  31* 38* 42* 43*  CREATININE 1.28* 1.16*  --  1.03* 0.99 0.99 1.2*  CALCIUM 8.0* 8.5*  --  7.6* 7.9* 8.1* 8.7  MG 1.5* 2.0 1.7  --   --   --   --    Recent Labs    07/26/18 0431 10/20/18 0055 03/17/19 1811  AST 20 19 34  ALT 11 10 17   ALKPHOS 65 64 60  BILITOT 1.3*  0.9 2.4*  PROT 5.7* 6.4* 6.7  ALBUMIN 3.0* 3.6 3.5   Recent Labs    10/20/18 0055 03/17/19 1811 03/23/19 0117 03/24/19 0512 03/25/19 0346 04/08/19 0000  WBC 3.4* 8.4 6.0 5.3 6.1 6.7  NEUTROABS 1.8 7.3  --   --   --  5  HGB 9.9* 13.3 9.7* 10.5* 10.1* 8.7*  HCT 31.2* 40.0 27.9* 30.9* 30.1* 26*  MCV 96.9 98.5 97.2 98.4 97.4  --   PLT 118* 122* 90* 80* 121* 134*   Lab Results  Component Value Date   TSH 0.923 05/31/2014   Lab Results  Component Value Date   HGBA1C 5.1 03/24/2019   Lab Results  Component Value Date   CHOL 185 03/19/2019   HDL 43 03/19/2019   LDLCALC 113 (H) 03/19/2019   TRIG 146 03/19/2019   CHOLHDL 4.3 03/19/2019     Assessment/Plan  1. Poor appetite - consumes 50-75% of her meals, nephew is open to tube feedings, will start on Remeron 7.5 mg Q HS to stimulate appetite, continue restorative eating/swallowing, magic cup, medpass and ensure, monitor weights  2. Weight loss ([place]) - discussed with nephew, [name], regarding significant weight loss (21.4 lbs) since admission to facility, nephew open to tube feedings after exhausting non-invasive interventions    Family/ staff Communication:  Discussed plan of care with resident and charge nurse.  Labs/tests ordered:  None  Goals of care:   Long-term care   Durenda Age, DNP, FNP-BC [facility] and Adult Medicine [phone] (Monday-Friday 8:00 a.m. - 5:00 p.m.) [phone] (after hours)

## 2019-06-27 DIAGNOSIS — R630 Anorexia: Secondary | ICD-10-CM | POA: Insufficient documentation

## 2019-06-27 DIAGNOSIS — R634 Abnormal weight loss: Secondary | ICD-10-CM | POA: Insufficient documentation

## 2019-06-30 ENCOUNTER — Encounter: Payer: Self-pay | Admitting: Adult Health

## 2019-06-30 NOTE — Progress Notes (Signed)
Location:  [place] Room Number: 122/A Place of Service:  SNF (31) Provider:  Durenda Age, DNP, FNP-BC  Patient Care Team: Ina Homes, MD as PCP - General (Internal Medicine)  Extended Emergency Contact Information Primary Emergency Contact: [name] Mobile Phone: [phone] Relation: Nephew Secondary Emergency Contact: Gilberto Better Mobile Phone: [phone] Relation: Nephew Interpreter needed? No  Code Status:  DNR  Goals of care: Advanced Directive information Advanced Directives 06/21/2019  Does Patient Have a Medical Advance Directive? Yes  Type of Advance Directive Out of facility DNR (pink MOST or yellow form)  Does patient want to make changes to medical advance directive? No - Patient declined  Copy of [place] in Chart? -  Would patient like information on creating a medical advance directive? -  Pre-existing out of facility DNR order (yellow form or pink MOST form) -     Chief Complaint  Patient presents with  . Medical Management of Chronic Issues    Routine visit of medical management    HPI:  Pt is a 85 y.o. female seen today for medical management of chronic diseases.     Past Medical History:  Diagnosis Date  . Anemia   . Aortic insufficiency    mild  . Arthritis    "arms" (08/09/2015)  . CAD (coronary artery disease)    cath 08/09/2014 95% stenosis in prox to mid RCA s/p DES, 80-90% prox OM2, 50% distal LAD  . CHF (congestive heart failure) ([place])   . CKD (chronic kidney disease) stage 3, GFR 30-59 ml/min   . Colon polyp    2009 colonoscopy, not retrieved for pathology  . Dyspnea   . Ectropion of left lower eyelid   . GERD (gastroesophageal reflux disease) 2009   EGD with benign gastric polyp too  . Hyperlipidemia   . Hypertension   . Pneumonia    history of  . Stroke ([place]) 1975  . Vitamin D deficiency    Past Surgical History:  Procedure Laterality Date  . ABDOMINAL  HYSTERECTOMY    . APPENDECTOMY    . ARTERY BIOPSY Left 12/16/2012   Procedure: BIOPSY TEMPORAL ARTERY;  Surgeon: Mal Misty, MD;  Location: [facility];  Service: Vascular;  Laterality: Left;  . CARDIAC CATHETERIZATION    . LEFT HEART CATHETERIZATION WITH CORONARY ANGIOGRAM N/A 08/09/2014   Procedure: LEFT HEART CATHETERIZATION WITH CORONARY ANGIOGRAM;  Surgeon: Peter M Martinique, MD;  Location: [facility] CATH LAB;  Service: Cardiovascular;  Laterality: N/A;  . PERCUTANEOUS CORONARY STENT INTERVENTION (PCI-S)  08/09/2014   Procedure: PERCUTANEOUS CORONARY STENT INTERVENTION (PCI-S);  Surgeon: Peter M Martinique, MD;  Location: [facility] CATH LAB;  Service: Cardiovascular;;  . TONSILLECTOMY      No Known Allergies  Outpatient Encounter Medications as of 06/30/2019  Medication Sig  . acetaminophen (TYLENOL) 325 MG tablet Take 650 mg by mouth every 6 (six) hours as needed for mild pain or headache.   . albuterol (VENTOLIN HFA) 108 (90 Base) MCG/ACT inhaler Inhale 1 puff into the lungs every 6 (six) hours as needed for wheezing or shortness of breath.  . Amino Acids-Protein Hydrolys (FEEDING SUPPLEMENT, PRO-STAT SUGAR FREE 64,) LIQD Place 30 mLs into feeding tube 2 (two) times daily.  Marland Kitchen amLODipine (NORVASC) 10 MG tablet Take 1 tablet (10 mg total) by mouth daily.  Marland Kitchen atorvastatin (LIPITOR) 40 MG tablet Take 1 tablet (40 mg total) by mouth daily at 6 PM.  . bisacodyl (DULCOLAX) 10 MG suppository If not  relieved by MOM, give 10 mg Bisacodyl suppositiory rectally X 1 dose in 24 hours as needed (Do not use constipation standing orders for residents with renal failure/CFR less than 30. Contact MD for orders) (Physician Order)  . bisoprolol (ZEBETA) 10 MG tablet Place 1 tablet (10 mg total) into feeding tube 2 (two) times daily.  . feeding supplement, ENSURE ENLIVE, (ENSURE ENLIVE) LIQD Take 237 mLs by mouth 2 (two) times daily between meals.  . furosemide (LASIX) 20 MG tablet TAKE 1 TABLET(20 MG) BY MOUTH DAILY  . isosorbide  mononitrate (IMDUR) 60 MG 24 hr tablet Take 1 tablet (60 mg total) by mouth daily.  Marland Kitchen losartan (COZAAR) 100 MG tablet TAKE 1 TABLET(100 MG) BY MOUTH DAILY  . magnesium hydroxide (MILK OF MAGNESIA) 400 MG/5ML suspension Take 30 mLs by mouth daily as needed for mild constipation.   . mirtazapine (REMERON) 15 MG tablet Take 7.5 mg by mouth at bedtime. FOR APPETITE *DISCARD REMAINING HALF TABLET AFTER BREAKING*  . NON FORMULARY Magic cup once daily to help prevent weight loss  . Nutritional Supplement LIQD Take 120 mLs by mouth 3 (three) times daily. MedPass  . pantoprazole sodium (PROTONIX) 40 mg/20 mL PACK Take 20 mLs (40 mg total) by mouth daily.  . polyvinyl alcohol (ARTIFICIAL TEARS) 1.4 % ophthalmic solution Place 1 drop into both eyes 2 (two) times daily. For dry eyes  . senna (SENOKOT) 8.6 MG TABS tablet Take 2 tablets by mouth at bedtime.  . Sodium Phosphates (RA SALINE ENEMA RE) If not relieved by Biscodyl suppository, give disposable Saline Enema rectally X 1 dose/24 hrs as needed (Do not use constipation standing orders for residents with renal failure/CFR less than 30. Contact MD for orders)(Physician Or   No facility-administered encounter medications on file as of 06/30/2019.    Review of Systems  GENERAL: No change in appetite, no fatigue, no weight changes, no fever, chills or weakness SKIN: Denies rash, itching, wounds, ulcer sores, or nail abnormalities EYES: Denies change in vision, dry eyes, eye pain, itching or discharge EARS: Denies change in hearing, ringing in ears, or earache NOSE: Denies nasal congestion or epistaxis MOUTH and THROAT: Denies oral discomfort, gingival pain or bleeding, pain from teeth or hoarseness   RESPIRATORY: no cough, SOB, DOE, wheezing, hemoptysis CARDIAC: No chest pain, edema or palpitations GI: No abdominal pain, diarrhea, constipation, heart burn, nausea or vomiting GU: Denies dysuria, frequency, hematuria, incontinence, or discharge  MUSCULOSKELETAL: Denies joint pain, muscle pain, back pain, restricted movement, or unusual weakness CIRCULATION: Denies claudication, edema of legs, varicosities, or cold extremities NEUROLOGICAL: Denies dizziness, syncope, numbness, or headache PSYCHIATRIC: Denies feelings of depression or anxiety. No report of hallucinations, insomnia, paranoia, or agitation ENDOCRINE: Denies polyphagia, polyuria, polydipsia, heat or cold intolerance HEME/LYMPH: Denies excessive bruising, petechia, enlarged lymph nodes, or bleeding problems IMMUNOLOGIC: Denies history of frequent infections, AIDS, or use of immunosuppressive agents   Immunization History  Administered Date(s) Administered  . Influenza Whole 03/28/2008  . Influenza,inj,Quad PF,6+ Mos 08/09/2013, 03/30/2015, 03/24/2017  . Influenza-Unspecified 03/31/2019  . Pneumococcal Conjugate-13 12/08/2014  . Pneumococcal Polysaccharide-23 09/18/2012  . Tdap 02/01/2016, 06/21/2018   Pertinent  Health Maintenance Due  Topic Date Due  . INFLUENZA VACCINE  Completed  . DEXA SCAN  Completed  . PNA vac Low Risk Adult  Completed   Fall Risk  03/19/2018 10/16/2017 09/26/2017 07/14/2017 04/21/2017  Falls in the past year? No Yes No No No  Number falls in past yr: - 1 - - -  Injury with Fall? - Yes - - -  Risk Factor Category  - High Fall Risk - - -  Risk for fall due to : - Impaired mobility;Impaired balance/gait Impaired balance/gait;Impaired mobility - -  Follow up - Falls prevention discussed - - -  Comment - - - - -     Vitals:   06/30/19 1146  BP: (!) 114/55  Pulse: 70  Resp: 17  Temp: (!) 97.5 F (36.4 C)  TempSrc: Oral  SpO2: 95%  Weight: 121 lb 3.2 oz (55 kg)  Height: 4\' 10"  (1.473 m)   Body mass index is 25.33 kg/m.  Physical Exam  GENERAL APPEARANCE: Well nourished. In no acute distress. Normal body habitus SKIN:  Skin is warm and dry. There are no suspicious lesions or rash HEAD: Normal in size and contour. No evidence of  trauma EYES: Lids open and close normally. No blepharitis, entropion or ectropion. PERRL. Conjunctivae are clear and sclerae are white. Lenses are without opacity EARS: Pinnae are normal. Patient hears normal voice tunes of the examiner MOUTH and THROAT: Lips are without lesions. Oral mucosa is moist and without lesions. Tongue is normal in shape, size, and color and without lesions NECK: supple, trachea midline, no neck masses, no thyroid tenderness, no thyromegaly LYMPHATICS: No LAN in the neck, no supraclavicular LAN RESPIRATORY: Breathing is even & unlabored, BS CTAB CARDIAC: RRR, no murmur,no extra heart sounds, no edema GI: Abdomen soft, normal BS, no masses, no tenderness, no hepatomegaly, no splenomegaly MUSCULOSKELETAL: No deformities. Movement at each extremity is full and painless. Strength is 5/5 at each extremity. Back is without kyphosis or scoliosis CIRCULATION: Pedal pulses are 2+. There is no edema of the legs, ankles and feet NEUROLOGICAL: There is no tremor. Speech is clear PSYCHIATRIC: Alert and oriented X 3. Affect and behavior are appropriate  Labs reviewed: Recent Labs    10/20/18 0055 10/20/18 0433 03/17/19 1922 03/23/19 0117 03/24/19 0512 03/25/19 0346 04/08/19 0000  NA 143 144  --  139 133* 132* 145  K 3.4* 3.7  --  3.6 4.2 4.1 3.7  CL 110 111  --  107 101 97* 105  CO2 22 21*  --  24 20* 24 25*  GLUCOSE 92 97  --  123* 118* 131*  --   BUN 17 15  --  31* 38* 42* 43*  CREATININE 1.28* 1.16*  --  1.03* 0.99 0.99 1.2*  CALCIUM 8.0* 8.5*  --  7.6* 7.9* 8.1* 8.7  MG 1.5* 2.0 1.7  --   --   --   --    Recent Labs    07/26/18 0431 10/20/18 0055 03/17/19 1811  AST 20 19 34  ALT 11 10 17   ALKPHOS 65 64 60  BILITOT 1.3* 0.9 2.4*  PROT 5.7* 6.4* 6.7  ALBUMIN 3.0* 3.6 3.5   Recent Labs    10/20/18 0055 03/17/19 1811 03/23/19 0117 03/24/19 0512 03/25/19 0346 04/08/19 0000  WBC 3.4* 8.4 6.0 5.3 6.1 6.7  NEUTROABS 1.8 7.3  --   --   --  5  HGB 9.9*  13.3 9.7* 10.5* 10.1* 8.7*  HCT 31.2* 40.0 27.9* 30.9* 30.1* 26*  MCV 96.9 98.5 97.2 98.4 97.4  --   PLT 118* 122* 90* 80* 121* 134*   Lab Results  Component Value Date   TSH 0.923 05/31/2014   Lab Results  Component Value Date   HGBA1C 5.1 03/24/2019   Lab Results  Component Value Date   CHOL  185 03/19/2019   HDL 43 03/19/2019   LDLCALC 113 (H) 03/19/2019   TRIG 146 03/19/2019   CHOLHDL 4.3 03/19/2019    Significant Diagnostic Results in last 30 days:  No results found.  Assessment/Plan    Family/ staff Communication:   Labs/tests ordered:    Goals of care:      Durenda Age, DNP, FNP-BC [facility] [phone] (Monday-Friday 8:00 a.m. - 5:00 p.m.) [phone] (after hours)  This encounter was created in error - please disregard.

## 2019-07-01 ENCOUNTER — Non-Acute Institutional Stay (SKILLED_NURSING_FACILITY): Payer: Medicare Other | Admitting: Adult Health

## 2019-07-01 ENCOUNTER — Encounter: Payer: Self-pay | Admitting: Adult Health

## 2019-07-01 DIAGNOSIS — I615 Nontraumatic intracerebral hemorrhage, intraventricular: Secondary | ICD-10-CM | POA: Diagnosis not present

## 2019-07-01 DIAGNOSIS — I10 Essential (primary) hypertension: Secondary | ICD-10-CM

## 2019-07-01 DIAGNOSIS — H04123 Dry eye syndrome of bilateral lacrimal glands: Secondary | ICD-10-CM | POA: Insufficient documentation

## 2019-07-01 DIAGNOSIS — R630 Anorexia: Secondary | ICD-10-CM | POA: Diagnosis not present

## 2019-07-01 DIAGNOSIS — I25119 Atherosclerotic heart disease of native coronary artery with unspecified angina pectoris: Secondary | ICD-10-CM | POA: Diagnosis not present

## 2019-07-01 NOTE — Progress Notes (Signed)
Location:  [place] Room Number: 122/A Place of Service:  SNF (31) Provider:  Durenda Age, DNP, FNP-BC  Patient Care Team: Ina Homes, MD as PCP - General (Internal Medicine)  Extended Emergency Contact Information Primary Emergency Contact: [name] Mobile Phone: [phone] Relation: Nephew Secondary Emergency Contact: Gilberto Better Mobile Phone: [phone] Relation: Nephew Interpreter needed? No  Code Status:  DNR  Goals of care: Advanced Directive information Advanced Directives 07/01/2019  Does Patient Have a Medical Advance Directive? Yes  Type of Advance Directive Out of facility DNR (pink MOST or yellow form)  Does patient want to make changes to medical advance directive? No - Patient declined  Copy of [place] in Chart? -  Would patient like information on creating a medical advance directive? -  Pre-existing out of facility DNR order (yellow form or pink MOST form) Yellow form placed in chart (order not valid for inpatient use)     Chief Complaint  Patient presents with  . Medical Management of Chronic Issues    Routine visit of medical management    HPI:  Pt is a 85 y.o. female seen today for medical management of chronic diseases. She has PMH of CHF, CKD stage 3, hypertension, hyperlipidemia, aortic insufficiency and CAD. She was seen in her room today. She is verbally responsive and denies pain. BPs 118/59, 91/46, 107/47, 114/55, 142/70, 114/56, 116/66. She takes Amlodipine, Losartan and Bisoprolol for hypertension. She has not complain of chest pains. She takes Isosorbide [place] ER for CAD. She has been having poor appetite and had significant weight loss. She was recently started on appetite stimulant, Remeron. Nephew is ok for her to have peg tube if she continues to have weight loss. Noted to have bilateral eyelashes with whitish crusting.   Past Medical History:  Diagnosis Date  . Anemia   .  Aortic insufficiency    mild  . Arthritis    "arms" (08/09/2015)  . CAD (coronary artery disease)    cath 08/09/2014 95% stenosis in prox to mid RCA s/p DES, 80-90% prox OM2, 50% distal LAD  . CHF (congestive heart failure) ([place])   . CKD (chronic kidney disease) stage 3, GFR 30-59 ml/min   . Colon polyp    2009 colonoscopy, not retrieved for pathology  . Dyspnea   . Ectropion of left lower eyelid   . GERD (gastroesophageal reflux disease) 2009   EGD with benign gastric polyp too  . Hyperlipidemia   . Hypertension   . Pneumonia    history of  . Stroke ([place]) 1975  . Vitamin D deficiency    Past Surgical History:  Procedure Laterality Date  . ABDOMINAL HYSTERECTOMY    . APPENDECTOMY    . ARTERY BIOPSY Left 12/16/2012   Procedure: BIOPSY TEMPORAL ARTERY;  Surgeon: Mal Misty, MD;  Location: [facility];  Service: Vascular;  Laterality: Left;  . CARDIAC CATHETERIZATION    . LEFT HEART CATHETERIZATION WITH CORONARY ANGIOGRAM N/A 08/09/2014   Procedure: LEFT HEART CATHETERIZATION WITH CORONARY ANGIOGRAM;  Surgeon: Peter M Martinique, MD;  Location: [facility] CATH LAB;  Service: Cardiovascular;  Laterality: N/A;  . PERCUTANEOUS CORONARY STENT INTERVENTION (PCI-S)  08/09/2014   Procedure: PERCUTANEOUS CORONARY STENT INTERVENTION (PCI-S);  Surgeon: Peter M Martinique, MD;  Location: [facility] CATH LAB;  Service: Cardiovascular;;  . TONSILLECTOMY      No Known Allergies  Outpatient Encounter Medications as of 07/01/2019  Medication Sig  . acetaminophen (TYLENOL) 325 MG tablet Take 650  mg by mouth every 6 (six) hours as needed for mild pain or headache.   . albuterol (VENTOLIN HFA) 108 (90 Base) MCG/ACT inhaler Inhale 1 puff into the lungs every 6 (six) hours as needed for wheezing or shortness of breath.  . Amino Acids-Protein Hydrolys (FEEDING SUPPLEMENT, PRO-STAT SUGAR FREE 64,) LIQD Place 30 mLs into feeding tube 2 (two) times daily.  Marland Kitchen amLODipine (NORVASC) 10 MG tablet Take 1 tablet (10 mg total) by mouth daily.    Marland Kitchen atorvastatin (LIPITOR) 40 MG tablet Take 1 tablet (40 mg total) by mouth daily at 6 PM.  . bisacodyl (DULCOLAX) 10 MG suppository If not relieved by MOM, give 10 mg Bisacodyl suppositiory rectally X 1 dose in 24 hours as needed (Do not use constipation standing orders for residents with renal failure/CFR less than 30. Contact MD for orders) (Physician Order)  . bisoprolol (ZEBETA) 10 MG tablet Place 1 tablet (10 mg total) into feeding tube 2 (two) times daily.  . feeding supplement, ENSURE ENLIVE, (ENSURE ENLIVE) LIQD Take 237 mLs by mouth 2 (two) times daily between meals.  . furosemide (LASIX) 20 MG tablet TAKE 1 TABLET(20 MG) BY MOUTH DAILY  . isosorbide mononitrate (IMDUR) 60 MG 24 hr tablet Take 1 tablet (60 mg total) by mouth daily.  Marland Kitchen losartan (COZAAR) 100 MG tablet TAKE 1 TABLET(100 MG) BY MOUTH DAILY  . magnesium hydroxide (MILK OF MAGNESIA) 400 MG/5ML suspension Take 30 mLs by mouth daily as needed for mild constipation.   . mirtazapine (REMERON) 15 MG tablet Take 7.5 mg by mouth at bedtime. FOR APPETITE *DISCARD REMAINING HALF TABLET AFTER BREAKING*  . NON FORMULARY Magic cup once daily to help prevent weight loss  . Nutritional Supplement LIQD Take 120 mLs by mouth 3 (three) times daily. MedPass  . pantoprazole sodium (PROTONIX) 40 mg/20 mL PACK Take 20 mLs (40 mg total) by mouth daily.  . polyvinyl alcohol (ARTIFICIAL TEARS) 1.4 % ophthalmic solution Place 1 drop into both eyes 2 (two) times daily. For dry eyes  . senna (SENOKOT) 8.6 MG TABS tablet Take 2 tablets by mouth at bedtime.  . Sodium Phosphates (RA SALINE ENEMA RE) If not relieved by Biscodyl suppository, give disposable Saline Enema rectally X 1 dose/24 hrs as needed (Do not use constipation standing orders for residents with renal failure/CFR less than 30. Contact MD for orders)(Physician Or   No facility-administered encounter medications on file as of 07/01/2019.    Review of Systems  Unable to obtain due to  dementia    Immunization History  Administered Date(s) Administered  . Influenza Whole 03/28/2008  . Influenza,inj,Quad PF,6+ Mos 08/09/2013, 03/30/2015, 03/24/2017  . Influenza-Unspecified 03/31/2019  . Pneumococcal Conjugate-13 12/08/2014  . Pneumococcal Polysaccharide-23 09/18/2012  . Tdap 02/01/2016, 06/21/2018   Pertinent  Health Maintenance Due  Topic Date Due  . INFLUENZA VACCINE  Completed  . DEXA SCAN  Completed  . PNA vac Low Risk Adult  Completed   Fall Risk  03/19/2018 10/16/2017 09/26/2017 07/14/2017 04/21/2017  Falls in the past year? No Yes No No No  Number falls in past yr: - 1 - - -  Injury with Fall? - Yes - - -  Risk Factor Category  - High Fall Risk - - -  Risk for fall due to : - Impaired mobility;Impaired balance/gait Impaired balance/gait;Impaired mobility - -  Follow up - Falls prevention discussed - - -  Comment - - - - -     Vitals:   07/01/19 1646  BP: (!) 118/59  Pulse: 78  Resp: 18  Temp: 97.8 F (36.6 C)  TempSrc: Oral  SpO2: 95%  Weight: 121 lb 3.2 oz (55 kg)  Height: 4\' 10"  (1.473 m)   Body mass index is 25.33 kg/m.  Physical Exam  GENERAL APPEARANCE: Well nourished. In no acute distress. Normal body habitus SKIN:  Skin is warm and dry.  EYES: Lids open and close normally. Has bilateral ectropion and eyelashes with dry with whitish crusting MOUTH and THROAT: Lips are without lesions. Oral mucosa is moist and without lesions. Tongue is normal in shape, size, and color and without lesions RESPIRATORY: Breathing is even & unlabored, BS CTAB CARDIAC: RRR, no murmur,no extra heart sounds, no edema GI: Abdomen soft, normal BS, no masses, no tenderness EXTREMITIES:  Able to move X 4 extremities NEUROLOGICAL: There is no tremor. Speech is clear. Alert to self, disoriented to time and place.  PSYCHIATRIC:  Affect and behavior are appropriate  Labs reviewed: Recent Labs    10/20/18 0055 10/20/18 0433 03/17/19 1922 03/23/19 0117  03/24/19 0512 03/25/19 0346 04/08/19 0000  NA 143 144  --  139 133* 132* 145  K 3.4* 3.7  --  3.6 4.2 4.1 3.7  CL 110 111  --  107 101 97* 105  CO2 22 21*  --  24 20* 24 25*  GLUCOSE 92 97  --  123* 118* 131*  --   BUN 17 15  --  31* 38* 42* 43*  CREATININE 1.28* 1.16*  --  1.03* 0.99 0.99 1.2*  CALCIUM 8.0* 8.5*  --  7.6* 7.9* 8.1* 8.7  MG 1.5* 2.0 1.7  --   --   --   --    Recent Labs    07/26/18 0431 10/20/18 0055 03/17/19 1811  AST 20 19 34  ALT 11 10 17   ALKPHOS 65 64 60  BILITOT 1.3* 0.9 2.4*  PROT 5.7* 6.4* 6.7  ALBUMIN 3.0* 3.6 3.5   Recent Labs    10/20/18 0055 03/17/19 1811 03/23/19 0117 03/24/19 0512 03/25/19 0346 04/08/19 0000  WBC 3.4* 8.4 6.0 5.3 6.1 6.7  NEUTROABS 1.8 7.3  --   --   --  5  HGB 9.9* 13.3 9.7* 10.5* 10.1* 8.7*  HCT 31.2* 40.0 27.9* 30.9* 30.1* 26*  MCV 96.9 98.5 97.2 98.4 97.4  --   PLT 118* 122* 90* 80* 121* 134*   Lab Results  Component Value Date   TSH 0.923 05/31/2014   Lab Results  Component Value Date   HGBA1C 5.1 03/24/2019   Lab Results  Component Value Date   CHOL 185 03/19/2019   HDL 43 03/19/2019   LDLCALC 113 (H) 03/19/2019   TRIG 146 03/19/2019   CHOLHDL 4.3 03/19/2019     Assessment/Plan  1. IVH (intraventricular hemorrhage) (HCC) - had a recent follow up with neurology, a repeat CT of head was recommended to check whether hemorrhaging has resolved so ASA 81 mg can be started, continue Atorvastatin and Bisoprolol, Amlodipine, Losartan  2. Essential hypertension - BP goal <130/90, stable, continue Bisoprolol, Amlodipine, Losartan  3. Coronary artery disease involving native coronary artery of native heart with angina pectoris ([place]) - no complaints of chest pains, continue Isosorbide [place] ER  4. Poor appetite - recently started on Mirtazapine, if she continues to have weight loss, nephew open for peg tube placement  5. Dry eyes - will start Ocusoft lid scrub to cleanse bilateral eyes  daily  Family/ staff Communication:   Discussed plan of care with resident and charge nurse.  Labs/tests ordered:  None  Goals of care:   Long-term care   Durenda Age, DNP, FNP-BC [facility] [phone] (Monday-Friday 8:00 a.m. - 5:00 p.m.) [phone] (after hours)

## 2019-07-13 DIAGNOSIS — I6300 Cerebral infarction due to thrombosis of unspecified precerebral artery: Secondary | ICD-10-CM | POA: Diagnosis not present

## 2019-07-13 DIAGNOSIS — M6281 Muscle weakness (generalized): Secondary | ICD-10-CM | POA: Diagnosis not present

## 2019-07-13 DIAGNOSIS — I69191 Dysphagia following nontraumatic intracerebral hemorrhage: Secondary | ICD-10-CM | POA: Diagnosis not present

## 2019-07-13 DIAGNOSIS — R1311 Dysphagia, oral phase: Secondary | ICD-10-CM | POA: Diagnosis not present

## 2019-07-14 DIAGNOSIS — R1311 Dysphagia, oral phase: Secondary | ICD-10-CM | POA: Diagnosis not present

## 2019-07-14 DIAGNOSIS — I69191 Dysphagia following nontraumatic intracerebral hemorrhage: Secondary | ICD-10-CM | POA: Diagnosis not present

## 2019-07-14 DIAGNOSIS — I6300 Cerebral infarction due to thrombosis of unspecified precerebral artery: Secondary | ICD-10-CM | POA: Diagnosis not present

## 2019-07-14 DIAGNOSIS — M6281 Muscle weakness (generalized): Secondary | ICD-10-CM | POA: Diagnosis not present

## 2019-07-15 DIAGNOSIS — I69191 Dysphagia following nontraumatic intracerebral hemorrhage: Secondary | ICD-10-CM | POA: Diagnosis not present

## 2019-07-15 DIAGNOSIS — M6281 Muscle weakness (generalized): Secondary | ICD-10-CM | POA: Diagnosis not present

## 2019-07-15 DIAGNOSIS — R1311 Dysphagia, oral phase: Secondary | ICD-10-CM | POA: Diagnosis not present

## 2019-07-15 DIAGNOSIS — I6300 Cerebral infarction due to thrombosis of unspecified precerebral artery: Secondary | ICD-10-CM | POA: Diagnosis not present

## 2019-07-16 DIAGNOSIS — M6281 Muscle weakness (generalized): Secondary | ICD-10-CM | POA: Diagnosis not present

## 2019-07-16 DIAGNOSIS — I69191 Dysphagia following nontraumatic intracerebral hemorrhage: Secondary | ICD-10-CM | POA: Diagnosis not present

## 2019-07-16 DIAGNOSIS — R1311 Dysphagia, oral phase: Secondary | ICD-10-CM | POA: Diagnosis not present

## 2019-07-16 DIAGNOSIS — I6300 Cerebral infarction due to thrombosis of unspecified precerebral artery: Secondary | ICD-10-CM | POA: Diagnosis not present

## 2019-07-19 DIAGNOSIS — R1311 Dysphagia, oral phase: Secondary | ICD-10-CM | POA: Diagnosis not present

## 2019-07-19 DIAGNOSIS — I6300 Cerebral infarction due to thrombosis of unspecified precerebral artery: Secondary | ICD-10-CM | POA: Diagnosis not present

## 2019-07-19 DIAGNOSIS — Z23 Encounter for immunization: Secondary | ICD-10-CM | POA: Diagnosis not present

## 2019-07-19 DIAGNOSIS — M6281 Muscle weakness (generalized): Secondary | ICD-10-CM | POA: Diagnosis not present

## 2019-07-19 DIAGNOSIS — I69191 Dysphagia following nontraumatic intracerebral hemorrhage: Secondary | ICD-10-CM | POA: Diagnosis not present

## 2019-07-20 DIAGNOSIS — I69191 Dysphagia following nontraumatic intracerebral hemorrhage: Secondary | ICD-10-CM | POA: Diagnosis not present

## 2019-07-20 DIAGNOSIS — M6281 Muscle weakness (generalized): Secondary | ICD-10-CM | POA: Diagnosis not present

## 2019-07-20 DIAGNOSIS — R1311 Dysphagia, oral phase: Secondary | ICD-10-CM | POA: Diagnosis not present

## 2019-07-20 DIAGNOSIS — I6300 Cerebral infarction due to thrombosis of unspecified precerebral artery: Secondary | ICD-10-CM | POA: Diagnosis not present

## 2019-07-21 DIAGNOSIS — I6300 Cerebral infarction due to thrombosis of unspecified precerebral artery: Secondary | ICD-10-CM | POA: Diagnosis not present

## 2019-07-21 DIAGNOSIS — M6281 Muscle weakness (generalized): Secondary | ICD-10-CM | POA: Diagnosis not present

## 2019-07-21 DIAGNOSIS — I69191 Dysphagia following nontraumatic intracerebral hemorrhage: Secondary | ICD-10-CM | POA: Diagnosis not present

## 2019-07-21 DIAGNOSIS — R1311 Dysphagia, oral phase: Secondary | ICD-10-CM | POA: Diagnosis not present

## 2019-07-22 DIAGNOSIS — R1311 Dysphagia, oral phase: Secondary | ICD-10-CM | POA: Diagnosis not present

## 2019-07-22 DIAGNOSIS — M6281 Muscle weakness (generalized): Secondary | ICD-10-CM | POA: Diagnosis not present

## 2019-07-22 DIAGNOSIS — I69191 Dysphagia following nontraumatic intracerebral hemorrhage: Secondary | ICD-10-CM | POA: Diagnosis not present

## 2019-07-22 DIAGNOSIS — I6300 Cerebral infarction due to thrombosis of unspecified precerebral artery: Secondary | ICD-10-CM | POA: Diagnosis not present

## 2019-07-23 ENCOUNTER — Non-Acute Institutional Stay (SKILLED_NURSING_FACILITY): Payer: Medicare Other | Admitting: Adult Health

## 2019-07-23 ENCOUNTER — Encounter: Payer: Self-pay | Admitting: Adult Health

## 2019-07-23 DIAGNOSIS — I25119 Atherosclerotic heart disease of native coronary artery with unspecified angina pectoris: Secondary | ICD-10-CM

## 2019-07-23 DIAGNOSIS — F0150 Vascular dementia without behavioral disturbance: Secondary | ICD-10-CM

## 2019-07-23 DIAGNOSIS — R630 Anorexia: Secondary | ICD-10-CM | POA: Diagnosis not present

## 2019-07-23 DIAGNOSIS — I6300 Cerebral infarction due to thrombosis of unspecified precerebral artery: Secondary | ICD-10-CM | POA: Diagnosis not present

## 2019-07-23 DIAGNOSIS — I10 Essential (primary) hypertension: Secondary | ICD-10-CM | POA: Diagnosis not present

## 2019-07-23 DIAGNOSIS — I5032 Chronic diastolic (congestive) heart failure: Secondary | ICD-10-CM | POA: Diagnosis not present

## 2019-07-23 DIAGNOSIS — R1311 Dysphagia, oral phase: Secondary | ICD-10-CM | POA: Diagnosis not present

## 2019-07-23 DIAGNOSIS — I69191 Dysphagia following nontraumatic intracerebral hemorrhage: Secondary | ICD-10-CM | POA: Diagnosis not present

## 2019-07-23 DIAGNOSIS — M6281 Muscle weakness (generalized): Secondary | ICD-10-CM | POA: Diagnosis not present

## 2019-07-23 NOTE — Progress Notes (Signed)
Location:  [place] Room Number: 122/A Place of Service:  SNF (31) Provider:  Durenda Age, DNP, FNP-BC  Patient Care Team: Ina Homes, MD as PCP - General (Internal Medicine)  Extended Emergency Contact Information Primary Emergency Contact: [name] Mobile Phone: [phone] Relation: Nephew Secondary Emergency Contact: Gilberto Better Mobile Phone: [phone] Relation: Nephew Interpreter needed? No  Code Status:  DNR  Goals of care: Advanced Directive information Advanced Directives 07/23/2019  Does Patient Have a Medical Advance Directive? Yes  Type of Advance Directive Out of facility DNR (pink MOST or yellow form)  Does patient want to make changes to medical advance directive? No - Patient declined  Copy of [place] in Chart? -  Would patient like information on creating a medical advance directive? -  Pre-existing out of facility DNR order (yellow form or pink MOST form) Yellow form placed in chart (order not valid for inpatient use)     Chief Complaint  Patient presents with  . Medical Management of Chronic Issues    Routine visit of medical management    HPI:  Pt is a 86 y.o. female seen today for medical management of chronic diseases. She has PMH CHF, CKD stage III, hypertension, hyperlipidemia, aortic insufficiency and CAD. She was seen in her room today. She denies any concerns. Today is day 5 post Moderna COVID-19 vaccine. No reported fever, SOB nor body aches. Latest weight is 118.8 lbs, continues to trend down. She takes Remeron 7.5 mg Q HS for poor appetite.  BPs 108/60, 136/7210/57, 112/81.  She takes amlodipine, losartan and bisoprolol for hypertension.   Past Medical History:  Diagnosis Date  . Anemia   . Aortic insufficiency    mild  . Arthritis    "arms" (08/09/2015)  . CAD (coronary artery disease)    cath 08/09/2014 95% stenosis in prox to mid RCA s/p DES, 80-90% prox OM2, 50% distal  LAD  . CHF (congestive heart failure) ([place])   . CKD (chronic kidney disease) stage 3, GFR 30-59 ml/min   . Colon polyp    2009 colonoscopy, not retrieved for pathology  . Dyspnea   . Ectropion of left lower eyelid   . GERD (gastroesophageal reflux disease) 2009   EGD with benign gastric polyp too  . Hyperlipidemia   . Hypertension   . Pneumonia    history of  . Stroke ([place]) 1975  . Vitamin D deficiency    Past Surgical History:  Procedure Laterality Date  . ABDOMINAL HYSTERECTOMY    . APPENDECTOMY    . ARTERY BIOPSY Left 12/16/2012   Procedure: BIOPSY TEMPORAL ARTERY;  Surgeon: Mal Misty, MD;  Location: [place];  Service: Vascular;  Laterality: Left;  . CARDIAC CATHETERIZATION    . LEFT HEART CATHETERIZATION WITH CORONARY ANGIOGRAM N/A 08/09/2014   Procedure: LEFT HEART CATHETERIZATION WITH CORONARY ANGIOGRAM;  Surgeon: Peter M Martinique, MD;  Location: [facility] CATH LAB;  Service: Cardiovascular;  Laterality: N/A;  . PERCUTANEOUS CORONARY STENT INTERVENTION (PCI-S)  08/09/2014   Procedure: PERCUTANEOUS CORONARY STENT INTERVENTION (PCI-S);  Surgeon: Peter M Martinique, MD;  Location: [facility] CATH LAB;  Service: Cardiovascular;;  . TONSILLECTOMY      No Known Allergies  Outpatient Encounter Medications as of 07/23/2019  Medication Sig  . acetaminophen (TYLENOL) 325 MG tablet Take 650 mg by mouth every 6 (six) hours as needed for mild pain or headache.   . albuterol (VENTOLIN HFA) 108 (90 Base) MCG/ACT inhaler Inhale 1 puff into  the lungs every 6 (six) hours as needed for wheezing or shortness of breath.  . Amino Acids-Protein Hydrolys (FEEDING SUPPLEMENT, PRO-STAT SUGAR FREE 64,) LIQD Place 30 mLs into feeding tube 2 (two) times daily.  Marland Kitchen amLODipine (NORVASC) 10 MG tablet Take 1 tablet (10 mg total) by mouth daily.  Marland Kitchen atorvastatin (LIPITOR) 40 MG tablet Take 1 tablet (40 mg total) by mouth daily at 6 PM.  . bisacodyl (DULCOLAX) 10 MG suppository If not relieved by MOM, give 10 mg Bisacodyl  suppositiory rectally X 1 dose in 24 hours as needed (Do not use constipation standing orders for residents with renal failure/CFR less than 30. Contact MD for orders) (Physician Order)  . bisoprolol (ZEBETA) 10 MG tablet Place 1 tablet (10 mg total) into feeding tube 2 (two) times daily.  . Eyelid Cleansers (OCUSOFT LID SCRUB PLUS) PADS CLEANSE BILATERAL EYELASHES ONCE DAILY DX: DRY EYES  . feeding supplement, ENSURE ENLIVE, (ENSURE ENLIVE) LIQD Take 237 mLs by mouth 2 (two) times daily between meals.  . furosemide (LASIX) 20 MG tablet TAKE 1 TABLET(20 MG) BY MOUTH DAILY  . isosorbide mononitrate (IMDUR) 60 MG 24 hr tablet Take 1 tablet (60 mg total) by mouth daily.  Marland Kitchen losartan (COZAAR) 100 MG tablet TAKE 1 TABLET(100 MG) BY MOUTH DAILY  . magnesium hydroxide (MILK OF MAGNESIA) 400 MG/5ML suspension Take 30 mLs by mouth daily as needed for mild constipation.   . mirtazapine (REMERON) 15 MG tablet Take 7.5 mg by mouth at bedtime. FOR APPETITE *DISCARD REMAINING HALF TABLET AFTER BREAKING*  . NON FORMULARY Magic cup once daily to help prevent weight loss  . Nutritional Supplement LIQD Take 120 mLs by mouth 3 (three) times daily. MedPass  . pantoprazole sodium (PROTONIX) 40 mg/20 mL PACK Take 20 mLs (40 mg total) by mouth daily.  . polyvinyl alcohol (ARTIFICIAL TEARS) 1.4 % ophthalmic solution Place 1 drop into both eyes 2 (two) times daily. For dry eyes  . senna (SENOKOT) 8.6 MG TABS tablet Take 2 tablets by mouth at bedtime.  . Sodium Phosphates (RA SALINE ENEMA RE) If not relieved by Biscodyl suppository, give disposable Saline Enema rectally X 1 dose/24 hrs as needed (Do not use constipation standing orders for residents with renal failure/CFR less than 30. Contact MD for orders)(Physician Or   No facility-administered encounter medications on file as of 07/23/2019.    Review of Systems unable to obtain due to dementia    Immunization History  Administered Date(s) Administered  . Influenza  Whole 03/28/2008  . Influenza,inj,Quad PF,6+ Mos 08/09/2013, 03/30/2015, 03/24/2017  . Influenza-Unspecified 03/31/2019  . Moderna SARS-COVID-2 Vaccination 07/19/2019  . Pneumococcal Conjugate-13 12/08/2014  . Pneumococcal Polysaccharide-23 09/18/2012  . Tdap 02/01/2016, 06/21/2018   Pertinent  Health Maintenance Due  Topic Date Due  . INFLUENZA VACCINE  Completed  . DEXA SCAN  Completed  . PNA vac Low Risk Adult  Completed   Fall Risk  03/19/2018 10/16/2017 09/26/2017 07/14/2017 04/21/2017  Falls in the past year? No Yes No No No  Number falls in past yr: - 1 - - -  Injury with Fall? - Yes - - -  Risk Factor Category  - High Fall Risk - - -  Risk for fall due to : - Impaired mobility;Impaired balance/gait Impaired balance/gait;Impaired mobility - -  Follow up - Falls prevention discussed - - -  Comment - - - - -     Vitals:   07/23/19 1405  BP: 108/60  Pulse: 62  Resp:  18  Temp: 98.2 F (36.8 C)  TempSrc: Oral  SpO2: 95%  Weight: 118 lb 12.8 oz (53.9 kg)  Height: 4\' 10"  (1.473 m)   Body mass index is 24.83 kg/m.  Physical Exam  GENERAL APPEARANCE: Well nourished. In no acute distress. Normal body habitus SKIN:  Skin is warm and dry.  EYES: Bilateral eyes +ectropion MOUTH and THROAT: Lips are without lesions. Oral mucosa is moist and without lesions.  RESPIRATORY: Breathing is even & unlabored, BS CTAB CARDIAC: RRR, no murmur,no extra heart sounds, no edema GI: Abdomen soft, normal BS, no masses, no tenderness EXTREMITIES:  Able to move X 4 extremities NEUROLOGICAL: There is no tremor. Speech is clear. Alert to self, disoriented to time and place. PSYCHIATRIC:  Affect and behavior are appropriate  Labs reviewed: Recent Labs    10/20/18 0055 10/20/18 0055 10/20/18 0433 03/17/19 1811 03/17/19 1922 03/19/19 0621 03/23/19 0117 03/23/19 0117 03/24/19 0512 03/25/19 0346 04/08/19 0000  NA 143   < > 144   < >  --    < > 139   < > 133* 132* 145  K 3.4*   < > 3.7    < >  --    < > 3.6   < > 4.2 4.1 3.7  CL 110   < > 111   < >  --    < > 107   < > 101 97* 105  CO2 22   < > 21*   < >  --    < > 24   < > 20* 24 25*  GLUCOSE 92   < > 97   < >  --    < > 123*  --  118* 131*  --   BUN 17   < > 15   < >  --    < > 31*   < > 38* 42* 43*  CREATININE 1.28*   < > 1.16*   < >  --    < > 1.03*   < > 0.99 0.99 1.2*  CALCIUM 8.0*   < > 8.5*   < >  --    < > 7.6*   < > 7.9* 8.1* 8.7  MG 1.5*  --  2.0  --  1.7  --   --   --   --   --   --    < > = values in this interval not displayed.   Recent Labs    07/26/18 0431 10/20/18 0055 03/17/19 1811  AST 20 19 34  ALT 11 10 17   ALKPHOS 65 64 60  BILITOT 1.3* 0.9 2.4*  PROT 5.7* 6.4* 6.7  ALBUMIN 3.0* 3.6 3.5   Recent Labs    10/20/18 0055 10/20/18 0055 03/17/19 1811 03/19/19 0621 03/23/19 0117 03/23/19 0117 03/24/19 0512 03/25/19 0346 04/08/19 0000  WBC 3.4*   < > 8.4   < > 6.0   < > 5.3 6.1 6.7  NEUTROABS 1.8  --  7.3  --   --   --   --   --  5  HGB 9.9*   < > 13.3   < > 9.7*   < > 10.5* 10.1* 8.7*  HCT 31.2*   < > 40.0   < > 27.9*   < > 30.9* 30.1* 26*  MCV 96.9   < > 98.5   < > 97.2  --  98.4 97.4  --   PLT 118*   < >  122*   < > 90*   < > 80* 121* 134*   < > = values in this interval not displayed.   Lab Results  Component Value Date   TSH 0.923 05/31/2014   Lab Results  Component Value Date   HGBA1C 5.1 03/24/2019   Lab Results  Component Value Date   CHOL 185 03/19/2019   HDL 43 03/19/2019   LDLCALC 113 (H) 03/19/2019   TRIG 146 03/19/2019   CHOLHDL 4.3 03/19/2019    Assessment/Plan  1. Poor appetite - continues to have weight loss, will increase Remeron from 7.5 mg to 15 mg Q HS, continue Magic cup, prostat, Ensure and med Pass for supplementation  2. Essential hypertension -Stable, continue clozapine, losartan and bisoprolol -Monitor BPs  3. Chronic heart failure with preserved ejection fraction (HCC) -No SOB nor edema, continue bisoprolol and Lasix  4. Coronary artery  disease involving native coronary artery of native heart with angina pectoris (HCC) -No complaints of chest pain, continue isosorbide mononitrate and atorvastatin  5. Vascular dementia without behavioral disturbance ([place]) -Continue supportive care, fall precautions    Family/ staff Communication: Discussed plan of care with resident and charge nurse.  Labs/tests ordered: None  Goals of care:   Long-term care   Durenda Age, DNP, FNP-BC [facility] and Adult Medicine [phone] (Monday-Friday 8:00 a.m. - 5:00 p.m.) [phone] (after hours)

## 2019-07-26 DIAGNOSIS — I69191 Dysphagia following nontraumatic intracerebral hemorrhage: Secondary | ICD-10-CM | POA: Diagnosis not present

## 2019-07-26 DIAGNOSIS — I6300 Cerebral infarction due to thrombosis of unspecified precerebral artery: Secondary | ICD-10-CM | POA: Diagnosis not present

## 2019-07-26 DIAGNOSIS — M6281 Muscle weakness (generalized): Secondary | ICD-10-CM | POA: Diagnosis not present

## 2019-07-26 DIAGNOSIS — R1311 Dysphagia, oral phase: Secondary | ICD-10-CM | POA: Diagnosis not present

## 2019-07-27 ENCOUNTER — Non-Acute Institutional Stay (SKILLED_NURSING_FACILITY): Payer: Medicare Other | Admitting: Adult Health

## 2019-07-27 ENCOUNTER — Encounter: Payer: Self-pay | Admitting: Adult Health

## 2019-07-27 DIAGNOSIS — M6281 Muscle weakness (generalized): Secondary | ICD-10-CM | POA: Diagnosis not present

## 2019-07-27 DIAGNOSIS — R630 Anorexia: Secondary | ICD-10-CM | POA: Diagnosis not present

## 2019-07-27 DIAGNOSIS — F419 Anxiety disorder, unspecified: Secondary | ICD-10-CM

## 2019-07-27 DIAGNOSIS — R1311 Dysphagia, oral phase: Secondary | ICD-10-CM | POA: Diagnosis not present

## 2019-07-27 DIAGNOSIS — I6300 Cerebral infarction due to thrombosis of unspecified precerebral artery: Secondary | ICD-10-CM | POA: Diagnosis not present

## 2019-07-27 DIAGNOSIS — I69191 Dysphagia following nontraumatic intracerebral hemorrhage: Secondary | ICD-10-CM | POA: Diagnosis not present

## 2019-07-27 MED ORDER — LORAZEPAM 0.5 MG PO TABS: 0.5000 mg | ORAL_TABLET | Freq: Every day | ORAL | 0 refills | Status: DC | PRN

## 2019-07-27 NOTE — Progress Notes (Signed)
Location:  [place] Room Number: 122/A Place of Service:  SNF (31) Provider:  Durenda Age, DNP, FNP-BC  Patient Care Team: Ina Homes, MD as PCP - General (Internal Medicine)  Extended Emergency Contact Information Primary Emergency Contact: [name] Mobile Phone: [phone] Relation: Nephew Secondary Emergency Contact: Gilberto Better Mobile Phone: [phone] Relation: Nephew Interpreter needed? No  Code Status:  DNR  Goals of care: Advanced Directive information Advanced Directives 07/27/2019  Does Patient Have a Medical Advance Directive? Yes  Type of Advance Directive Out of facility DNR (pink MOST or yellow form)  Does patient want to make changes to medical advance directive? No - Patient declined  Copy of [place] in Chart? -  Would patient like information on creating a medical advance directive? -  Pre-existing out of facility DNR order (yellow form or pink MOST form) Yellow form placed in chart (order not valid for inpatient use)     Chief Complaint  Patient presents with  . Acute Visit    Patient getting restless and tries to get out of bed sometimes    HPI:  Pt is a 86 y.o. female who was reported by CNA to be restless,  removing her clothes the tries to get out of bed. Staff tried to explain to her that getting out of her bed unassisted will cause her to fall and injure herself. She is non-ambulatory and uses a wheelchair for transport. She got upset earlier because rapid COVID-19 test was done this morning. She is now is refusing incontinence care. She has history of falls. She had 14.8 lbs weight loss in a month. She was reported to have poor appetite. She has PMH of CHF, CKD stage III, hypertension, hyperlipidemia, aortic insufficiency and CAD    Past Medical History:  Diagnosis Date  . Anemia   . Aortic insufficiency    mild  . Arthritis    "arms" (08/09/2015)  . CAD (coronary artery  disease)    cath 08/09/2014 95% stenosis in prox to mid RCA s/p DES, 80-90% prox OM2, 50% distal LAD  . CHF (congestive heart failure) ([place])   . CKD (chronic kidney disease) stage 3, GFR 30-59 ml/min   . Colon polyp    2009 colonoscopy, not retrieved for pathology  . Dyspnea   . Ectropion of left lower eyelid   . GERD (gastroesophageal reflux disease) 2009   EGD with benign gastric polyp too  . Hyperlipidemia   . Hypertension   . Pneumonia    history of  . Stroke ([place]) 1975  . Vitamin D deficiency    Past Surgical History:  Procedure Laterality Date  . ABDOMINAL HYSTERECTOMY    . APPENDECTOMY    . ARTERY BIOPSY Left 12/16/2012   Procedure: BIOPSY TEMPORAL ARTERY;  Surgeon: Mal Misty, MD;  Location: [place];  Service: Vascular;  Laterality: Left;  . CARDIAC CATHETERIZATION    . LEFT HEART CATHETERIZATION WITH CORONARY ANGIOGRAM N/A 08/09/2014   Procedure: LEFT HEART CATHETERIZATION WITH CORONARY ANGIOGRAM;  Surgeon: Peter M Martinique, MD;  Location: [facility] CATH LAB;  Service: Cardiovascular;  Laterality: N/A;  . PERCUTANEOUS CORONARY STENT INTERVENTION (PCI-S)  08/09/2014   Procedure: PERCUTANEOUS CORONARY STENT INTERVENTION (PCI-S);  Surgeon: Peter M Martinique, MD;  Location: [facility] CATH LAB;  Service: Cardiovascular;;  . TONSILLECTOMY      No Known Allergies  Outpatient Encounter Medications as of 07/27/2019  Medication Sig  . acetaminophen (TYLENOL) 325 MG tablet Take 650 mg by  mouth every 6 (six) hours as needed for mild pain or headache.   . albuterol (VENTOLIN HFA) 108 (90 Base) MCG/ACT inhaler Inhale 1 puff into the lungs every 6 (six) hours as needed for wheezing or shortness of breath.  . Amino Acids-Protein Hydrolys (FEEDING SUPPLEMENT, PRO-STAT SUGAR FREE 64,) LIQD Place 30 mLs into feeding tube 2 (two) times daily.  Marland Kitchen amLODipine (NORVASC) 10 MG tablet Take 1 tablet (10 mg total) by mouth daily.  Marland Kitchen atorvastatin (LIPITOR) 40 MG tablet Take 1 tablet (40 mg total) by mouth daily at 6 PM.    . bisacodyl (DULCOLAX) 10 MG suppository If not relieved by MOM, give 10 mg Bisacodyl suppositiory rectally X 1 dose in 24 hours as needed (Do not use constipation standing orders for residents with renal failure/CFR less than 30. Contact MD for orders) (Physician Order)  . bisoprolol (ZEBETA) 10 MG tablet Place 1 tablet (10 mg total) into feeding tube 2 (two) times daily.  . Eyelid Cleansers (OCUSOFT LID SCRUB PLUS) PADS CLEANSE BILATERAL EYELASHES ONCE DAILY DX: DRY EYES  . feeding supplement, ENSURE ENLIVE, (ENSURE ENLIVE) LIQD Take 237 mLs by mouth 2 (two) times daily between meals.  . furosemide (LASIX) 20 MG tablet TAKE 1 TABLET(20 MG) BY MOUTH DAILY  . isosorbide mononitrate (IMDUR) 60 MG 24 hr tablet Take 1 tablet (60 mg total) by mouth daily.  Marland Kitchen losartan (COZAAR) 100 MG tablet TAKE 1 TABLET(100 MG) BY MOUTH DAILY  . magnesium hydroxide (MILK OF MAGNESIA) 400 MG/5ML suspension Take 30 mLs by mouth daily as needed for mild constipation.   . mirtazapine (REMERON) 15 MG tablet Take 15 mg by mouth at bedtime. FOR APPETITE *DISCARD REMAINING HALF TABLET AFTER BREAKING*   . NON FORMULARY Magic cup once daily to help prevent weight loss  . Nutritional Supplement LIQD Take 120 mLs by mouth 3 (three) times daily. MedPass  . pantoprazole sodium (PROTONIX) 40 mg/20 mL PACK Take 20 mLs (40 mg total) by mouth daily.  . polyvinyl alcohol (ARTIFICIAL TEARS) 1.4 % ophthalmic solution Place 1 drop into both eyes 2 (two) times daily. For dry eyes  . senna (SENOKOT) 8.6 MG TABS tablet Take 2 tablets by mouth at bedtime.  . Sodium Phosphates (RA SALINE ENEMA RE) If not relieved by Biscodyl suppository, give disposable Saline Enema rectally X 1 dose/24 hrs as needed (Do not use constipation standing orders for residents with renal failure/CFR less than 30. Contact MD for orders)(Physician Or   No facility-administered encounter medications on file as of 07/27/2019.    Review of Systems  Unable to obtain due  to dementia     Immunization History  Administered Date(s) Administered  . Influenza Whole 03/28/2008  . Influenza,inj,Quad PF,6+ Mos 08/09/2013, 03/30/2015, 03/24/2017  . Influenza-Unspecified 03/31/2019  . Moderna SARS-COVID-2 Vaccination 07/19/2019  . Pneumococcal Conjugate-13 12/08/2014  . Pneumococcal Polysaccharide-23 09/18/2012  . Tdap 02/01/2016, 06/21/2018   Pertinent  Health Maintenance Due  Topic Date Due  . INFLUENZA VACCINE  Completed  . DEXA SCAN  Completed  . PNA vac Low Risk Adult  Completed   Fall Risk  03/19/2018 10/16/2017 09/26/2017 07/14/2017 04/21/2017  Falls in the past year? No Yes No No No  Number falls in past yr: - 1 - - -  Injury with Fall? - Yes - - -  Risk Factor Category  - High Fall Risk - - -  Risk for fall due to : - Impaired mobility;Impaired balance/gait Impaired balance/gait;Impaired mobility - -  Follow up -  Falls prevention discussed - - -  Comment - - - - -     Vitals:   07/27/19 1358  BP: (!) 111/58  Pulse: 77  Resp: 18  Temp: 97.7 F (36.5 C)  TempSrc: Oral  SpO2: 95%  Weight: 118 lb 12.8 oz (53.9 kg)  Height: 4\' 10"  (1.473 m)   Body mass index is 24.83 kg/m.  Physical Exam  GENERAL APPEARANCE: Well nourished. In no acute distress. Normal body habitus SKIN:  Skin is warm and dry.  MOUTH and THROAT: Lips are without lesions. Oral mucosa is moist and without lesions. Tongue is normal in shape, size, and color and without lesions RESPIRATORY: Breathing is even & unlabored, BS CTAB CARDIAC: RRR, no murmur,no extra heart sounds, no edema GI: Abdomen soft, normal BS, no masses, no tenderness NEUROLOGICAL: There is no tremor. Speech is clear. Alert to self, disoriented to time and place. PSYCHIATRIC:  Affect and behavior are appropriate  Labs reviewed: Recent Labs    10/20/18 0055 10/20/18 0055 10/20/18 0433 03/17/19 1811 03/17/19 1922 03/19/19 0621 03/23/19 0117 03/23/19 0117 03/24/19 0512 03/25/19 0346  04/08/19 0000  NA 143   < > 144   < >  --    < > 139   < > 133* 132* 145  K 3.4*   < > 3.7   < >  --    < > 3.6   < > 4.2 4.1 3.7  CL 110   < > 111   < >  --    < > 107   < > 101 97* 105  CO2 22   < > 21*   < >  --    < > 24   < > 20* 24 25*  GLUCOSE 92   < > 97   < >  --    < > 123*  --  118* 131*  --   BUN 17   < > 15   < >  --    < > 31*   < > 38* 42* 43*  CREATININE 1.28*   < > 1.16*   < >  --    < > 1.03*   < > 0.99 0.99 1.2*  CALCIUM 8.0*   < > 8.5*   < >  --    < > 7.6*   < > 7.9* 8.1* 8.7  MG 1.5*  --  2.0  --  1.7  --   --   --   --   --   --    < > = values in this interval not displayed.   Recent Labs    10/20/18 0055 03/17/19 1811  AST 19 34  ALT 10 17  ALKPHOS 64 60  BILITOT 0.9 2.4*  PROT 6.4* 6.7  ALBUMIN 3.6 3.5   Recent Labs    10/20/18 0055 10/20/18 0055 03/17/19 1811 03/19/19 0621 03/23/19 0117 03/23/19 0117 03/24/19 0512 03/25/19 0346 04/08/19 0000  WBC 3.4*   < > 8.4   < > 6.0   < > 5.3 6.1 6.7  NEUTROABS 1.8  --  7.3  --   --   --   --   --  5  HGB 9.9*   < > 13.3   < > 9.7*   < > 10.5* 10.1* 8.7*  HCT 31.2*   < > 40.0   < > 27.9*   < > 30.9* 30.1* 26*  MCV 96.9   < >  98.5   < > 97.2  --  98.4 97.4  --   PLT 118*   < > 122*   < > 90*   < > 80* 121* 134*   < > = values in this interval not displayed.   Lab Results  Component Value Date   TSH 0.923 05/31/2014   Lab Results  Component Value Date   HGBA1C 5.1 03/24/2019   Lab Results  Component Value Date   CHOL 185 03/19/2019   HDL 43 03/19/2019   LDLCALC 113 (H) 03/19/2019   TRIG 146 03/19/2019   CHOLHDL 4.3 03/19/2019      Assessment/Plan  1. Anxiety - will start PRN Ativan, psych consult - LORazepam (ATIVAN) 0.5 MG tablet; Take 1 tablet (0.5 mg total) by mouth daily as needed for anxiety.  Dispense: 14 tablet; Refill: 0  2. Poor appetite - will discontinue Remeron 7.5 mg Q HS and start Remeron 15 mg Q HS - continue Pro-stat, magic cup, Ensure and medpass for  supplementation    Family/ staff Communication:  Discussed plan of care with resident.  Labs/tests ordered:  None  Goals of care:   Long-term care   Durenda Age, DNP, FNP-BC [facility] and Adult Medicine [phone] (Monday-Friday 8:00 a.m. - 5:00 p.m.) [phone] (after hours)

## 2019-07-28 DIAGNOSIS — M6281 Muscle weakness (generalized): Secondary | ICD-10-CM | POA: Diagnosis not present

## 2019-07-28 DIAGNOSIS — I69191 Dysphagia following nontraumatic intracerebral hemorrhage: Secondary | ICD-10-CM | POA: Diagnosis not present

## 2019-07-28 DIAGNOSIS — R1311 Dysphagia, oral phase: Secondary | ICD-10-CM | POA: Diagnosis not present

## 2019-07-28 DIAGNOSIS — I6300 Cerebral infarction due to thrombosis of unspecified precerebral artery: Secondary | ICD-10-CM | POA: Diagnosis not present

## 2019-07-29 DIAGNOSIS — I6300 Cerebral infarction due to thrombosis of unspecified precerebral artery: Secondary | ICD-10-CM | POA: Diagnosis not present

## 2019-07-29 DIAGNOSIS — D649 Anemia, unspecified: Secondary | ICD-10-CM | POA: Diagnosis not present

## 2019-07-29 DIAGNOSIS — I69191 Dysphagia following nontraumatic intracerebral hemorrhage: Secondary | ICD-10-CM | POA: Diagnosis not present

## 2019-07-29 DIAGNOSIS — M6281 Muscle weakness (generalized): Secondary | ICD-10-CM | POA: Diagnosis not present

## 2019-07-29 DIAGNOSIS — I10 Essential (primary) hypertension: Secondary | ICD-10-CM | POA: Diagnosis not present

## 2019-07-29 DIAGNOSIS — R1311 Dysphagia, oral phase: Secondary | ICD-10-CM | POA: Diagnosis not present

## 2019-07-29 LAB — BASIC METABOLIC PANEL
BUN: 34 — AB (ref 4–21)
CO2: 24 — AB (ref 13–22)
Chloride: 102 (ref 99–108)
Creatinine: 1.2 — AB (ref 0.5–1.1)
Glucose: 90
Potassium: 3.8 (ref 3.4–5.3)
Sodium: 138 (ref 137–147)

## 2019-07-29 LAB — COMPREHENSIVE METABOLIC PANEL
Calcium: 8.8 (ref 8.7–10.7)
GFR calc Af Amer: 46.64
GFR calc non Af Amer: 40.24

## 2019-07-30 DIAGNOSIS — M6281 Muscle weakness (generalized): Secondary | ICD-10-CM | POA: Diagnosis not present

## 2019-07-30 DIAGNOSIS — R1311 Dysphagia, oral phase: Secondary | ICD-10-CM | POA: Diagnosis not present

## 2019-07-30 DIAGNOSIS — I6300 Cerebral infarction due to thrombosis of unspecified precerebral artery: Secondary | ICD-10-CM | POA: Diagnosis not present

## 2019-07-30 DIAGNOSIS — I69191 Dysphagia following nontraumatic intracerebral hemorrhage: Secondary | ICD-10-CM | POA: Diagnosis not present

## 2019-08-02 DIAGNOSIS — I6300 Cerebral infarction due to thrombosis of unspecified precerebral artery: Secondary | ICD-10-CM | POA: Diagnosis not present

## 2019-08-02 DIAGNOSIS — M6281 Muscle weakness (generalized): Secondary | ICD-10-CM | POA: Diagnosis not present

## 2019-08-02 DIAGNOSIS — J449 Chronic obstructive pulmonary disease, unspecified: Secondary | ICD-10-CM | POA: Diagnosis not present

## 2019-08-02 DIAGNOSIS — R1311 Dysphagia, oral phase: Secondary | ICD-10-CM | POA: Diagnosis not present

## 2019-08-02 DIAGNOSIS — I69191 Dysphagia following nontraumatic intracerebral hemorrhage: Secondary | ICD-10-CM | POA: Diagnosis not present

## 2019-08-03 ENCOUNTER — Encounter: Payer: Self-pay | Admitting: Adult Health

## 2019-08-03 ENCOUNTER — Non-Acute Institutional Stay (SKILLED_NURSING_FACILITY): Payer: Medicare Other | Admitting: Adult Health

## 2019-08-03 DIAGNOSIS — R630 Anorexia: Secondary | ICD-10-CM | POA: Diagnosis not present

## 2019-08-03 DIAGNOSIS — I10 Essential (primary) hypertension: Secondary | ICD-10-CM | POA: Diagnosis not present

## 2019-08-03 DIAGNOSIS — U071 COVID-19: Secondary | ICD-10-CM

## 2019-08-03 DIAGNOSIS — D649 Anemia, unspecified: Secondary | ICD-10-CM | POA: Diagnosis not present

## 2019-08-03 DIAGNOSIS — E559 Vitamin D deficiency, unspecified: Secondary | ICD-10-CM | POA: Diagnosis not present

## 2019-08-03 DIAGNOSIS — J449 Chronic obstructive pulmonary disease, unspecified: Secondary | ICD-10-CM | POA: Diagnosis not present

## 2019-08-03 DIAGNOSIS — I69191 Dysphagia following nontraumatic intracerebral hemorrhage: Secondary | ICD-10-CM | POA: Diagnosis not present

## 2019-08-03 DIAGNOSIS — R1311 Dysphagia, oral phase: Secondary | ICD-10-CM | POA: Diagnosis not present

## 2019-08-03 DIAGNOSIS — I6300 Cerebral infarction due to thrombosis of unspecified precerebral artery: Secondary | ICD-10-CM | POA: Diagnosis not present

## 2019-08-03 DIAGNOSIS — I5032 Chronic diastolic (congestive) heart failure: Secondary | ICD-10-CM | POA: Diagnosis not present

## 2019-08-03 DIAGNOSIS — M6281 Muscle weakness (generalized): Secondary | ICD-10-CM | POA: Diagnosis not present

## 2019-08-03 LAB — CBC AND DIFFERENTIAL
HCT: 27 — AB (ref 36–46)
Hemoglobin: 9.2 — AB (ref 12.0–16.0)
Neutrophils Absolute: 3
Platelets: 158 (ref 150–399)
WBC: 4.9

## 2019-08-03 LAB — BASIC METABOLIC PANEL
BUN: 41 — AB (ref 4–21)
CO2: 23 — AB (ref 13–22)
Chloride: 103 (ref 99–108)
Creatinine: 1.3 — AB (ref 0.5–1.1)
Glucose: 98
Potassium: 4.1 (ref 3.4–5.3)
Sodium: 140 (ref 137–147)

## 2019-08-03 LAB — CBC: RBC: 2.93 — AB (ref 3.87–5.11)

## 2019-08-03 LAB — COMPREHENSIVE METABOLIC PANEL
Calcium: 9.1 (ref 8.7–10.7)
GFR calc Af Amer: 40.81
GFR calc non Af Amer: 35.21

## 2019-08-03 NOTE — Progress Notes (Signed)
Location:  [place] Room Number: [id number] Place of Service:  SNF (31) Provider:  Durenda Age, DNP, FNP-BC  Patient Care Team: Ina Homes, MD as PCP - General (Internal Medicine)  Extended Emergency Contact Information Primary Emergency Contact: [name] Mobile Phone: [phone] Relation: Nephew Secondary Emergency Contact: Gilberto Better Mobile Phone: [phone] Relation: Nephew Interpreter needed? No  Code Status:  DNR  Goals of care: Advanced Directive information Advanced Directives 08/03/2019  Does Patient Have a Medical Advance Directive? Yes  Type of Advance Directive Out of facility DNR (pink MOST or yellow form)  Does patient want to make changes to medical advance directive? No - Patient declined  Copy of [place] in Chart? -  Would patient like information on creating a medical advance directive? -  Pre-existing out of facility DNR order (yellow form or pink MOST form) Yellow form placed in chart (order not valid for inpatient use)     Chief Complaint  Patient presents with  . Acute Visit    Positive Covid-19 Test    HPI:  Pt is a 86 y.o. female who is a long-term care resident of [facility]. She has PMH of CHF, CKD stage III, hypertension, hyperlipidemia, aortic insufficiency and CAD. She tested positive for for COVID-19 Ag. No reported fever,SOB nor body aches. She has poor appetite and has been having weight loss. Latest weight 118.2 lbs. Admission weight was 145.4 lbs on 03/29/19. On 07/19/19, she had Moderna COVID-19 vaccine injection.    Past Medical History:  Diagnosis Date  . Anemia   . Aortic insufficiency    mild  . Arthritis    "arms" (08/09/2015)  . CAD (coronary artery disease)    cath 08/09/2014 95% stenosis in prox to mid RCA s/p DES, 80-90% prox OM2, 50% distal LAD  . CHF (congestive heart failure) ([place])   . CKD (chronic kidney disease) stage 3, GFR 30-59  ml/min   . Colon polyp    2009 colonoscopy, not retrieved for pathology  . Dyspnea   . Ectropion of left lower eyelid   . GERD (gastroesophageal reflux disease) 2009   EGD with benign gastric polyp too  . Hyperlipidemia   . Hypertension   . Pneumonia    history of  . Stroke ([place]) 1975  . Vitamin D deficiency    Past Surgical History:  Procedure Laterality Date  . ABDOMINAL HYSTERECTOMY    . APPENDECTOMY    . ARTERY BIOPSY Left 12/16/2012   Procedure: BIOPSY TEMPORAL ARTERY;  Surgeon: Mal Misty, MD;  Location: [place];  Service: Vascular;  Laterality: Left;  . CARDIAC CATHETERIZATION    . LEFT HEART CATHETERIZATION WITH CORONARY ANGIOGRAM N/A 08/09/2014   Procedure: LEFT HEART CATHETERIZATION WITH CORONARY ANGIOGRAM;  Surgeon: Peter M Martinique, MD;  Location: [facility] CATH LAB;  Service: Cardiovascular;  Laterality: N/A;  . PERCUTANEOUS CORONARY STENT INTERVENTION (PCI-S)  08/09/2014   Procedure: PERCUTANEOUS CORONARY STENT INTERVENTION (PCI-S);  Surgeon: Peter M Martinique, MD;  Location: [facility] CATH LAB;  Service: Cardiovascular;;  . TONSILLECTOMY      No Known Allergies  Outpatient Encounter Medications as of 08/03/2019  Medication Sig  . acetaminophen (TYLENOL) 325 MG tablet Take 650 mg by mouth every 6 (six) hours as needed for mild pain or headache.   . albuterol (VENTOLIN HFA) 108 (90 Base) MCG/ACT inhaler Inhale 1 puff into the lungs every 6 (six) hours as needed for wheezing or shortness of breath.  . Amino  Acids-Protein Hydrolys (FEEDING SUPPLEMENT, PRO-STAT SUGAR FREE 64,) LIQD Place 30 mLs into feeding tube 2 (two) times daily.  Marland Kitchen amLODipine (NORVASC) 10 MG tablet Take 1 tablet (10 mg total) by mouth daily.  Marland Kitchen aspirin EC 81 MG tablet Take 81 mg by mouth daily.  Marland Kitchen atorvastatin (LIPITOR) 40 MG tablet Take 1 tablet (40 mg total) by mouth daily at 6 PM.  . bisacodyl (DULCOLAX) 10 MG suppository If not relieved by MOM, give 10 mg Bisacodyl suppositiory rectally X 1 dose in 24 hours as needed  (Do not use constipation standing orders for residents with renal failure/CFR less than 30. Contact MD for orders) (Physician Order)  . bisoprolol (ZEBETA) 10 MG tablet Place 1 tablet (10 mg total) into feeding tube 2 (two) times daily.  Marland Kitchen doxycycline (DORYX) 100 MG EC tablet Take 100 mg by mouth 2 (two) times daily.  . Eyelid Cleansers (OCUSOFT LID SCRUB PLUS) PADS CLEANSE BILATERAL EYELASHES ONCE DAILY DX: DRY EYES  . feeding supplement, ENSURE ENLIVE, (ENSURE ENLIVE) LIQD Take 237 mLs by mouth 2 (two) times daily between meals.  . furosemide (LASIX) 20 MG tablet TAKE 1 TABLET(20 MG) BY MOUTH DAILY  . isosorbide mononitrate (IMDUR) 60 MG 24 hr tablet Take 1 tablet (60 mg total) by mouth daily.  Marland Kitchen LORazepam (ATIVAN) 0.5 MG tablet Take 1 tablet (0.5 mg total) by mouth daily as needed for anxiety.  Marland Kitchen losartan (COZAAR) 100 MG tablet TAKE 1 TABLET(100 MG) BY MOUTH DAILY  . magnesium hydroxide (MILK OF MAGNESIA) 400 MG/5ML suspension Take 30 mLs by mouth daily as needed for mild constipation.   . NON FORMULARY Magic cup once daily to help prevent weight loss  . Nutritional Supplement LIQD Take 120 mLs by mouth 3 (three) times daily. MedPass  . pantoprazole sodium (PROTONIX) 40 mg/20 mL PACK Take 20 mLs (40 mg total) by mouth daily.  . polyvinyl alcohol (ARTIFICIAL TEARS) 1.4 % ophthalmic solution Place 1 drop into both eyes 2 (two) times daily. For dry eyes  . predniSONE (DELTASONE) 10 MG tablet Take 10 mg by mouth 2 (two) times daily with a meal.  . saccharomyces boulardii (FLORASTOR) 250 MG capsule Take 250 mg by mouth 2 (two) times daily.  Marland Kitchen senna (SENOKOT) 8.6 MG TABS tablet Take 2 tablets by mouth at bedtime.  . Sodium Phosphates (RA SALINE ENEMA RE) If not relieved by Biscodyl suppository, give disposable Saline Enema rectally X 1 dose/24 hrs as needed (Do not use constipation standing orders for residents with renal failure/CFR less than 30. Contact MD for orders)(Physician Or  . zinc sulfate  220 (50 Zn) MG capsule Take 220 mg by mouth daily.  . [DISCONTINUED] mirtazapine (REMERON) 15 MG tablet Take 15 mg by mouth at bedtime. FOR APPETITE *DISCARD REMAINING HALF TABLET AFTER BREAKING*    No facility-administered encounter medications on file as of 08/03/2019.    Review of Systems  GENERAL: +weight loss, poor appetite MOUTH and THROAT: Denies oral discomfort, gingival pain or bleeding RESPIRATORY: no cough, SOB, DOE, wheezing, hemoptysis CARDIAC: No chest pain, edema or palpitations GI: No abdominal pain, diarrhea, constipation, heart burn, nausea or vomiting GU: Denies dysuria, frequency, hematuria or discharge NEUROLOGICAL: Denies dizziness, syncope, numbness, or headache PSYCHIATRIC: Denies feelings of depression or anxiety. No report of hallucinations, insomnia, paranoia, or agitation   Immunization History  Administered Date(s) Administered  . Influenza Whole 03/28/2008  . Influenza,inj,Quad PF,6+ Mos 08/09/2013, 03/30/2015, 03/24/2017  . Influenza-Unspecified 03/31/2019  . Moderna SARS-COVID-2 Vaccination 07/19/2019  .  Pneumococcal Conjugate-13 12/08/2014  . Pneumococcal Polysaccharide-23 09/18/2012  . Tdap 02/01/2016, 06/21/2018   Pertinent  Health Maintenance Due  Topic Date Due  . INFLUENZA VACCINE  Completed  . DEXA SCAN  Completed  . PNA vac Low Risk Adult  Completed   Fall Risk  03/19/2018 10/16/2017 09/26/2017 07/14/2017 04/21/2017  Falls in the past year? No Yes No No No  Number falls in past yr: - 1 - - -  Injury with Fall? - Yes - - -  Risk Factor Category  - High Fall Risk - - -  Risk for fall due to : - Impaired mobility;Impaired balance/gait Impaired balance/gait;Impaired mobility - -  Follow up - Falls prevention discussed - - -  Comment - - - - -     Vitals:   08/03/19 1153  BP: 128/80  Pulse: 69  Resp: (!) 21  Temp: 98.4 F (36.9 C)  TempSrc: Oral  SpO2: 95%  Weight: 118 lb 3.2 oz (53.6 kg)  Height: 4\' 10"  (1.473 m)   Body mass index  is 24.7 kg/m.  Physical Exam  GENERAL APPEARANCE:  In no acute distress. Normal body habitus SKIN:  Skin is warm and dry.  MOUTH and THROAT: Lips are without lesions. Oral mucosa is moist and without lesions. Tongue is normal in shape, size, and color and without lesions RESPIRATORY: Breathing is even & unlabored, BS CTAB CARDIAC: RRR, no murmur,no extra heart sounds, no edema GI: Abdomen soft, normal BS, no masses, no tenderness NEUROLOGICAL: There is no tremor. Speech is clear. Alert to self, disoriented to time and place. PSYCHIATRIC:  Affect and behavior are appropriate  Labs reviewed: Recent Labs    10/20/18 0055 10/20/18 0055 10/20/18 0433 03/17/19 1811 03/17/19 1922 03/19/19 [id number] 03/23/19 0117 03/23/19 0117 03/24/19 0512 03/24/19 0512 03/25/19 0346 04/08/19 0000 06/11/19 0000  NA 143   < > 144   < >  --    < > 139   < > 133*   < > 132* 145 146  K 3.4*   < > 3.7   < >  --    < > 3.6   < > 4.2   < > 4.1 3.7 3.4  CL 110   < > 111   < >  --    < > 107   < > 101   < > 97* 105 104  CO2 22   < > 21*   < >  --    < > 24   < > 20*   < > 24 25* 29*  GLUCOSE 92   < > 97   < >  --    < > 123*  --  118*  --  131*  --   --   BUN 17   < > 15   < >  --    < > 31*   < > 38*   < > 42* 43* 20  CREATININE 1.28*   < > 1.16*   < >  --    < > 1.03*   < > 0.99   < > 0.99 1.2* 0.8  CALCIUM 8.0*   < > 8.5*   < >  --    < > 7.6*   < > 7.9*   < > 8.1* 8.7 8.7  MG 1.5*  --  2.0  --  1.7  --   --   --   --   --   --   --   --    < > =  values in this interval not displayed.   Recent Labs    10/20/18 0055 03/17/19 1811  AST 19 34  ALT 10 17  ALKPHOS 64 60  BILITOT 0.9 2.4*  PROT 6.4* 6.7  ALBUMIN 3.6 3.5   Recent Labs    10/20/18 0055 10/20/18 0055 03/17/19 1811 03/19/19 0621 03/23/19 0117 03/23/19 0117 03/24/19 0512 03/24/19 0512 03/25/19 0346 03/25/19 0346 04/08/19 0000 06/10/19 0000 06/11/19 0000  WBC 3.4*   < > 8.4   < > 6.0   < > 5.3   < > 6.1  --  6.7 5.9 5.9    NEUTROABS 1.8  --  7.3  --   --   --   --   --   --   --  5  --   --   HGB 9.9*   < > 13.3   < > 9.7*   < > 10.5*   < > 10.1*   < > 8.7* 10.3* 9.9*  HCT 31.2*   < > 40.0   < > 27.9*   < > 30.9*   < > 30.1*   < > 26* 30* 29*  MCV 96.9   < > 98.5   < > 97.2  --  98.4  --  97.4  --   --   --   --   PLT 118*   < > 122*   < > 90*   < > 80*   < > 121*   < > 134* 189 143*   < > = values in this interval not displayed.   Lab Results  Component Value Date   TSH 0.923 05/31/2014   Lab Results  Component Value Date   HGBA1C 5.1 03/24/2019   Lab Results  Component Value Date   CHOL 185 03/19/2019   HDL 43 03/19/2019   LDLCALC 113 (H) 03/19/2019   TRIG 146 03/19/2019   CHOLHDL 4.3 03/19/2019      Assessment/Plan  1. COVID-19 -COVID-19 Ag test was positive, had Moderna COVID-19 vaccination on 07/19/19, start doxycycline 100 mg 1 tab p.o. twice daily x5 days, Florastor 250 mg twice a days x8 days, aspirin EC 81 mg 1 tab daily x5 days, prednisone 10 mg give 3 tabs = 30 mg twice a day x5 days, zinc sulfate 220 mg 1 capsule twice a day x5 days  2. Poor appetite -Continue medazepam 50 mg 1 tab at bedtime, Magic cup daily and med Pass 120 mL 3 times a day, Ensure 237 mL twice a day    Family/ staff Communication: Discussed plan of care with resident and charge nurse.  Labs/tests ordered: CBC and BMP on 08/03/2019  Goals of care:   Long-term care   Durenda Age, DNP, FNP-BC [facility] [phone] (Monday-Friday 8:00 a.m. - 5:00 p.m.) [phone] (after hours)

## 2019-08-04 DIAGNOSIS — M6281 Muscle weakness (generalized): Secondary | ICD-10-CM | POA: Diagnosis not present

## 2019-08-04 DIAGNOSIS — J449 Chronic obstructive pulmonary disease, unspecified: Secondary | ICD-10-CM | POA: Diagnosis not present

## 2019-08-04 DIAGNOSIS — I6300 Cerebral infarction due to thrombosis of unspecified precerebral artery: Secondary | ICD-10-CM | POA: Diagnosis not present

## 2019-08-04 DIAGNOSIS — R1311 Dysphagia, oral phase: Secondary | ICD-10-CM | POA: Diagnosis not present

## 2019-08-04 DIAGNOSIS — I69191 Dysphagia following nontraumatic intracerebral hemorrhage: Secondary | ICD-10-CM | POA: Diagnosis not present

## 2019-08-05 DIAGNOSIS — R1311 Dysphagia, oral phase: Secondary | ICD-10-CM | POA: Diagnosis not present

## 2019-08-05 DIAGNOSIS — I6300 Cerebral infarction due to thrombosis of unspecified precerebral artery: Secondary | ICD-10-CM | POA: Diagnosis not present

## 2019-08-05 DIAGNOSIS — M6281 Muscle weakness (generalized): Secondary | ICD-10-CM | POA: Diagnosis not present

## 2019-08-05 DIAGNOSIS — J449 Chronic obstructive pulmonary disease, unspecified: Secondary | ICD-10-CM | POA: Diagnosis not present

## 2019-08-05 DIAGNOSIS — I69191 Dysphagia following nontraumatic intracerebral hemorrhage: Secondary | ICD-10-CM | POA: Diagnosis not present

## 2019-08-06 DIAGNOSIS — F419 Anxiety disorder, unspecified: Secondary | ICD-10-CM | POA: Diagnosis not present

## 2019-08-06 DIAGNOSIS — R1311 Dysphagia, oral phase: Secondary | ICD-10-CM | POA: Diagnosis not present

## 2019-08-06 DIAGNOSIS — M6281 Muscle weakness (generalized): Secondary | ICD-10-CM | POA: Diagnosis not present

## 2019-08-06 DIAGNOSIS — J449 Chronic obstructive pulmonary disease, unspecified: Secondary | ICD-10-CM | POA: Diagnosis not present

## 2019-08-06 DIAGNOSIS — I69191 Dysphagia following nontraumatic intracerebral hemorrhage: Secondary | ICD-10-CM | POA: Diagnosis not present

## 2019-08-06 DIAGNOSIS — I6300 Cerebral infarction due to thrombosis of unspecified precerebral artery: Secondary | ICD-10-CM | POA: Diagnosis not present

## 2019-08-06 DIAGNOSIS — G4700 Insomnia, unspecified: Secondary | ICD-10-CM | POA: Diagnosis not present

## 2019-08-06 DIAGNOSIS — F0390 Unspecified dementia without behavioral disturbance: Secondary | ICD-10-CM | POA: Diagnosis not present

## 2019-08-09 DIAGNOSIS — J449 Chronic obstructive pulmonary disease, unspecified: Secondary | ICD-10-CM | POA: Diagnosis not present

## 2019-08-09 DIAGNOSIS — M6281 Muscle weakness (generalized): Secondary | ICD-10-CM | POA: Diagnosis not present

## 2019-08-09 DIAGNOSIS — I6300 Cerebral infarction due to thrombosis of unspecified precerebral artery: Secondary | ICD-10-CM | POA: Diagnosis not present

## 2019-08-09 DIAGNOSIS — R1311 Dysphagia, oral phase: Secondary | ICD-10-CM | POA: Diagnosis not present

## 2019-08-09 DIAGNOSIS — I69191 Dysphagia following nontraumatic intracerebral hemorrhage: Secondary | ICD-10-CM | POA: Diagnosis not present

## 2019-08-10 DIAGNOSIS — I69191 Dysphagia following nontraumatic intracerebral hemorrhage: Secondary | ICD-10-CM | POA: Diagnosis not present

## 2019-08-10 DIAGNOSIS — J449 Chronic obstructive pulmonary disease, unspecified: Secondary | ICD-10-CM | POA: Diagnosis not present

## 2019-08-10 DIAGNOSIS — M6281 Muscle weakness (generalized): Secondary | ICD-10-CM | POA: Diagnosis not present

## 2019-08-10 DIAGNOSIS — R1311 Dysphagia, oral phase: Secondary | ICD-10-CM | POA: Diagnosis not present

## 2019-08-10 DIAGNOSIS — I6300 Cerebral infarction due to thrombosis of unspecified precerebral artery: Secondary | ICD-10-CM | POA: Diagnosis not present

## 2019-08-11 DIAGNOSIS — I69191 Dysphagia following nontraumatic intracerebral hemorrhage: Secondary | ICD-10-CM | POA: Diagnosis not present

## 2019-08-11 DIAGNOSIS — R1311 Dysphagia, oral phase: Secondary | ICD-10-CM | POA: Diagnosis not present

## 2019-08-11 DIAGNOSIS — I6300 Cerebral infarction due to thrombosis of unspecified precerebral artery: Secondary | ICD-10-CM | POA: Diagnosis not present

## 2019-08-11 DIAGNOSIS — M6281 Muscle weakness (generalized): Secondary | ICD-10-CM | POA: Diagnosis not present

## 2019-08-11 DIAGNOSIS — J449 Chronic obstructive pulmonary disease, unspecified: Secondary | ICD-10-CM | POA: Diagnosis not present

## 2019-08-13 DIAGNOSIS — M6281 Muscle weakness (generalized): Secondary | ICD-10-CM | POA: Diagnosis not present

## 2019-08-13 DIAGNOSIS — J449 Chronic obstructive pulmonary disease, unspecified: Secondary | ICD-10-CM | POA: Diagnosis not present

## 2019-08-13 DIAGNOSIS — I6300 Cerebral infarction due to thrombosis of unspecified precerebral artery: Secondary | ICD-10-CM | POA: Diagnosis not present

## 2019-08-13 DIAGNOSIS — R1311 Dysphagia, oral phase: Secondary | ICD-10-CM | POA: Diagnosis not present

## 2019-08-13 DIAGNOSIS — I69191 Dysphagia following nontraumatic intracerebral hemorrhage: Secondary | ICD-10-CM | POA: Diagnosis not present

## 2019-08-15 DIAGNOSIS — J449 Chronic obstructive pulmonary disease, unspecified: Secondary | ICD-10-CM | POA: Diagnosis not present

## 2019-08-15 DIAGNOSIS — M6281 Muscle weakness (generalized): Secondary | ICD-10-CM | POA: Diagnosis not present

## 2019-08-15 DIAGNOSIS — I69191 Dysphagia following nontraumatic intracerebral hemorrhage: Secondary | ICD-10-CM | POA: Diagnosis not present

## 2019-08-15 DIAGNOSIS — I6300 Cerebral infarction due to thrombosis of unspecified precerebral artery: Secondary | ICD-10-CM | POA: Diagnosis not present

## 2019-08-15 DIAGNOSIS — R1311 Dysphagia, oral phase: Secondary | ICD-10-CM | POA: Diagnosis not present

## 2019-08-16 ENCOUNTER — Encounter: Payer: Self-pay | Admitting: Adult Health

## 2019-08-16 ENCOUNTER — Non-Acute Institutional Stay (SKILLED_NURSING_FACILITY): Payer: Medicare Other | Admitting: Adult Health

## 2019-08-16 DIAGNOSIS — F0150 Vascular dementia without behavioral disturbance: Secondary | ICD-10-CM | POA: Diagnosis not present

## 2019-08-16 DIAGNOSIS — I25119 Atherosclerotic heart disease of native coronary artery with unspecified angina pectoris: Secondary | ICD-10-CM

## 2019-08-16 DIAGNOSIS — I6300 Cerebral infarction due to thrombosis of unspecified precerebral artery: Secondary | ICD-10-CM | POA: Diagnosis not present

## 2019-08-16 DIAGNOSIS — I69191 Dysphagia following nontraumatic intracerebral hemorrhage: Secondary | ICD-10-CM | POA: Diagnosis not present

## 2019-08-16 DIAGNOSIS — M6281 Muscle weakness (generalized): Secondary | ICD-10-CM | POA: Diagnosis not present

## 2019-08-16 DIAGNOSIS — J449 Chronic obstructive pulmonary disease, unspecified: Secondary | ICD-10-CM | POA: Diagnosis not present

## 2019-08-16 DIAGNOSIS — R1311 Dysphagia, oral phase: Secondary | ICD-10-CM | POA: Diagnosis not present

## 2019-08-16 DIAGNOSIS — I10 Essential (primary) hypertension: Secondary | ICD-10-CM

## 2019-08-16 DIAGNOSIS — R634 Abnormal weight loss: Secondary | ICD-10-CM | POA: Diagnosis not present

## 2019-08-16 DIAGNOSIS — U071 COVID-19: Secondary | ICD-10-CM | POA: Diagnosis not present

## 2019-08-16 NOTE — Progress Notes (Signed)
Location:  [place] Room Number: 126/A Place of Service:  SNF (31) Provider:  Durenda Age, DNP, FNP-BC  Patient Care Team: Ina Homes, MD as PCP - General (Internal Medicine)  Extended Emergency Contact Information Primary Emergency Contact: [name] Mobile Phone: [phone] Relation: Nephew Secondary Emergency Contact: Gilberto Better Mobile Phone: [phone] Relation: Nephew Interpreter needed? No  Code Status:  DNR  Goals of care: Advanced Directive information Advanced Directives 08/16/2019  Does Patient Have a Medical Advance Directive? Yes  Type of Advance Directive Out of facility DNR (pink MOST or yellow form)  Does patient want to make changes to medical advance directive? No - Patient declined  Copy of [place] in Chart? -  Would patient like information on creating a medical advance directive? -  Pre-existing out of facility DNR order (yellow form or pink MOST form) Yellow form placed in chart (order not valid for inpatient use)     Chief Complaint  Patient presents with  . Medical Management of Chronic Issues    Routine visit of medical mangement    HPI:  Pt is a 86 y.o. female seen today for medical management of chronic diseases.  She has PMH of CHF, CKD stage III, hypertension, hyperlipidemia aortic insufficiency and CAD.  She was then noticed with COVID-19 on 08/03/19 and was immediately started on doxycycline, Florastor, aspirin EC 81, prednisone and zinc sulfate x5 days.  She had her first [place] COVID-19 vaccination on 07/19/2019.  She was seen in her room today.  No noted caffeine nor SOB.  No reported fever.  She continues to have weight loss, 5.02% in 31 days and 22.09% in 101 days.  Talke to Mr. [name], nephew, and discussed weight loss and PEG tube. There was a concern for her possibly pulling out PEG tube and would like to re-evaluate weight loss in 3 months.   Past Medical History:    Diagnosis Date  . Anemia   . Aortic insufficiency    mild  . Arthritis    "arms" (08/09/2015)  . CAD (coronary artery disease)    cath 08/09/2014 95% stenosis in prox to mid RCA s/p DES, 80-90% prox OM2, 50% distal LAD  . CHF (congestive heart failure) ([place])   . CKD (chronic kidney disease) stage 3, GFR 30-59 ml/min   . Colon polyp    2009 colonoscopy, not retrieved for pathology  . Dyspnea   . Ectropion of left lower eyelid   . GERD (gastroesophageal reflux disease) 2009   EGD with benign gastric polyp too  . Hyperlipidemia   . Hypertension   . Pneumonia    history of  . Stroke ([place]) 1975  . Vitamin D deficiency    Past Surgical History:  Procedure Laterality Date  . ABDOMINAL HYSTERECTOMY    . APPENDECTOMY    . ARTERY BIOPSY Left 12/16/2012   Procedure: BIOPSY TEMPORAL ARTERY;  Surgeon: Mal Misty, MD;  Location: [place];  Service: Vascular;  Laterality: Left;  . CARDIAC CATHETERIZATION    . LEFT HEART CATHETERIZATION WITH CORONARY ANGIOGRAM N/A 08/09/2014   Procedure: LEFT HEART CATHETERIZATION WITH CORONARY ANGIOGRAM;  Surgeon: Peter M Martinique, MD;  Location: [facility] C[facility] LAB;  Service: Cardiovascular;  Laterality: N/A;  . PERCUTANEOUS CORONARY STENT INTERVENTION (PCI-S)  08/09/2014   Procedure: PERCUTANEOUS CORONARY STENT INTERVENTION (PCI-S);  Surgeon: Peter M Martinique, MD;  Location: [facility] CATH LAB;  Service: Cardiovascular;;  . TONSILLECTOMY      No Known Allergies  Outpatient Encounter Medications as of 08/16/2019  Medication Sig  . acetaminophen (TYLENOL) 325 MG tablet Take 650 mg by mouth every 6 (six) hours as needed for mild pain or headache.   . albuterol (VENTOLIN HFA) 108 (90 Base) MCG/ACT inhaler Inhale 1 puff into the lungs every 6 (six) hours as needed for wheezing or shortness of breath.  . Amino Acids-Protein Hydrolys (FEEDING SUPPLEMENT, PRO-STAT SUGAR FREE 64,) LIQD Place 30 mLs into feeding tube 2 (two) times daily.  Marland Kitchen amLODipine (NORVASC) 10 MG tablet Take 1 tablet  (10 mg total) by mouth daily.  Marland Kitchen atorvastatin (LIPITOR) 40 MG tablet Take 1 tablet (40 mg total) by mouth daily at 6 PM.  . bisacodyl (DULCOLAX) 10 MG suppository If not relieved by MOM, give 10 mg Bisacodyl suppositiory rectally X 1 dose in 24 hours as needed (Do not use constipation standing orders for residents with renal failure/CFR less than 30. Contact MD for orders) (Physician Order)  . bisoprolol (ZEBETA) 10 MG tablet Place 1 tablet (10 mg total) into feeding tube 2 (two) times daily.  . Eyelid Cleansers (OCUSOFT LID SCRUB PLUS) PADS CLEANSE BILATERAL EYELASHES ONCE DAILY DX: DRY EYES  . feeding supplement, ENSURE ENLIVE, (ENSURE ENLIVE) LIQD Take 237 mLs by mouth 2 (two) times daily between meals.  . furosemide (LASIX) 20 MG tablet TAKE 1 TABLET(20 MG) BY MOUTH DAILY  . isosorbide mononitrate (IMDUR) 60 MG 24 hr tablet Take 1 tablet (60 mg total) by mouth daily.  Marland Kitchen losartan (COZAAR) 100 MG tablet TAKE 1 TABLET(100 MG) BY MOUTH DAILY  . magnesium hydroxide (MILK OF MAGNESIA) 400 MG/5ML suspension Take 30 mLs by mouth daily as needed for mild constipation.   . Melatonin 3 MG TABS Take 3 mg by mouth at bedtime. Insomnia  . mirtazapine (REMERON SOL-TAB) 15 MG disintegrating tablet Take 15 mg by mouth at bedtime.  . NON FORMULARY Magic cup once daily to help prevent weight loss  . Nutritional Supplement LIQD Take 120 mLs by mouth 3 (three) times daily. MedPass  . pantoprazole sodium (PROTONIX) 40 mg/20 mL PACK Take 20 mLs (40 mg total) by mouth daily.  . polyvinyl alcohol (ARTIFICIAL TEARS) 1.4 % ophthalmic solution Place 1 drop into both eyes 2 (two) times daily. For dry eyes  . senna (SENOKOT) 8.6 MG TABS tablet Take 2 tablets by mouth at bedtime.  . Sodium Phosphates (RA SALINE ENEMA RE) If not relieved by Biscodyl suppository, give disposable Saline Enema rectally X 1 dose/24 hrs as needed (Do not use constipation standing orders for residents with renal failure/CFR less than 30. Contact MD  for orders)(Physician Or  . [DISCONTINUED] LORazepam (ATIVAN) 0.5 MG tablet Take 1 tablet (0.5 mg total) by mouth daily as needed for anxiety.   No facility-administered encounter medications on file as of 08/16/2019.    Review of Systems  GENERAL: No fever, chills or weakness, +weight loss and poor appetite MOUTH and THROAT: Denies oral discomfort, gingival pain or bleeding RESPIRATORY: no cough, SOB, DOE, wheezing, hemoptysis CARDIAC: No chest pain, edema or palpitations GI: No abdominal pain, diarrhea, constipation, heart burn, nausea or vomiting NEUROLOGICAL: Denies dizziness, syncope, numbness, or headache PSYCHIATRIC: Denies feelings of depression or anxiety. No report of hallucinations, insomnia, paranoia, or agitation   Immunization History  Administered Date(s) Administered  . Influenza Whole 03/28/2008  . Influenza,inj,Quad PF,6+ Mos 08/09/2013, 03/30/2015, 03/24/2017  . Influenza-Unspecified 03/31/2019  . Moderna SARS-COVID-2 Vaccination 07/19/2019  . Pneumococcal Conjugate-13 12/08/2014  . Pneumococcal Polysaccharide-23 09/18/2012  .  Tdap 02/01/2016, 06/21/2018   Pertinent  Health Maintenance Due  Topic Date Due  . INFLUENZA VACCINE  Completed  . DEXA SCAN  Completed  . PNA vac Low Risk Adult  Completed   Fall Risk  03/19/2018 10/16/2017 09/26/2017 07/14/2017 04/21/2017  Falls in the past year? No Yes No No No  Number falls in past yr: - 1 - - -  Injury with Fall? - Yes - - -  Risk Factor Category  - High Fall Risk - - -  Risk for fall due to : - Impaired mobility;Impaired balance/gait Impaired balance/gait;Impaired mobility - -  Follow up - Falls prevention discussed - - -  Comment - - - - -     Vitals:   08/16/19 0949  BP: 118/62  Pulse: 60  Resp: 18  Temp: 98.2 F (36.8 C)  TempSrc: Oral  SpO2: 95%  Weight: 113 lb 9.6 oz (51.5 kg)  Height: 4\' 10"  (1.473 m)   Body mass index is 23.74 kg/m.  Physical Exam  GENERAL APPEARANCE: In no acute distress.  Normal body habitus SKIN:  Skin is warm and dry.  MOUTH and THROAT: Lips are without lesions. Oral mucosa is moist and without lesions. Tongue is normal in shape, size, and color and without lesions RESPIRATORY: Breathing is even & unlabored, BS CTAB CARDIAC: RRR, no murmur,no extra heart sounds, no edema GI: Abdomen soft, normal BS, no masses, no tenderness NEUROLOGICAL: There is no tremor. Speech is clear. PSYCHIATRIC:  Affect and behavior are appropriate  Labs reviewed: Recent Labs    10/20/18 0055 10/20/18 0055 10/20/18 0433 03/17/19 1811 03/17/19 1922 03/19/19 [id number] 03/23/19 0117 03/23/19 0117 03/24/19 0512 03/24/19 0512 03/25/19 0346 04/08/19 0000 06/11/19 0000  NA 143   < > 144   < >  --    < > 139   < > 133*   < > 132* 145 146  K 3.4*   < > 3.7   < >  --    < > 3.6   < > 4.2   < > 4.1 3.7 3.4  CL 110   < > 111   < >  --    < > 107   < > 101   < > 97* 105 104  CO2 22   < > 21*   < >  --    < > 24   < > 20*   < > 24 25* 29*  GLUCOSE 92   < > 97   < >  --    < > 123*  --  118*  --  131*  --   --   BUN 17   < > 15   < >  --    < > 31*   < > 38*   < > 42* 43* 20  CREATININE 1.28*   < > 1.16*   < >  --    < > 1.03*   < > 0.99   < > 0.99 1.2* 0.8  CALCIUM 8.0*   < > 8.5*   < >  --    < > 7.6*   < > 7.9*   < > 8.1* 8.7 8.7  MG 1.5*  --  2.0  --  1.7  --   --   --   --   --   --   --   --    < > = values in this interval not displayed.  Recent Labs    10/20/18 0055 03/17/19 1811  AST 19 34  ALT 10 17  ALKPHOS 64 60  BILITOT 0.9 2.4*  PROT 6.4* 6.7  ALBUMIN 3.6 3.5   Recent Labs    10/20/18 0055 10/20/18 0055 03/17/19 1811 03/19/19 0621 03/23/19 0117 03/23/19 0117 03/24/19 0512 03/24/19 0512 03/25/19 0346 03/25/19 0346 04/08/19 0000 06/10/19 0000 06/11/19 0000  WBC 3.4*   < > 8.4   < > 6.0   < > 5.3   < > 6.1  --  6.7 5.9 5.9  NEUTROABS 1.8  --  7.3  --   --   --   --   --   --   --  5  --   --   HGB 9.9*   < > 13.3   < > 9.7*   < > 10.5*   < > 10.1*   <  > 8.7* 10.3* 9.9*  HCT 31.2*   < > 40.0   < > 27.9*   < > 30.9*   < > 30.1*   < > 26* 30* 29*  MCV 96.9   < > 98.5   < > 97.2  --  98.4  --  97.4  --   --   --   --   PLT 118*   < > 122*   < > 90*   < > 80*   < > 121*   < > 134* 189 143*   < > = values in this interval not displayed.   Lab Results  Component Value Date   TSH 0.923 05/31/2014   Lab Results  Component Value Date   HGBA1C 5.1 03/24/2019   Lab Results  Component Value Date   CHOL 185 03/19/2019   HDL 43 03/19/2019   LDLCALC 113 (H) 03/19/2019   TRIG 146 03/19/2019   CHOLHDL 4.3 03/19/2019     Assessment/Plan  1. Weight loss - continues to have weight loss of 5.02% in 31 days and 22.09% in 101 days, discussed with Mr. [name], nephew, regarding weight loss and PEG tube, he wants to re-evaluate  Weights in 3 months and to hold off on any peg tube insertion -Continue pured diet, sandwiches, Pro-stat, Magic cup and Remeron  2. Essential hypertension - BPs well-controlled, continue Amlodipine and Atorvastatin  3. Coronary artery disease involving native coronary artery of native heart with angina pectoris (HCC) - no complaints of chest pains, continue Isosorbide MN, Atorvastatin and Bisoprolol  4. COVID-19 - completed treatment with Doxycycline, ASA EC 81 mg, Prednisone, Zinc sulfate and Florastor  5. Vascular dementia without behavioral disturbance ([place]) - continue supportive care, fall precautions     Family/ staff Communication:  Discussed plan of care with resident and charge nurse.   Labs/tests ordered:  None  Goals of care:   Long-term care   Durenda Age, DNP, FNP-BC [facility] [phone] (Monday-Friday 8:00 a.m. - 5:00 p.m.) [phone] (after hours)

## 2019-08-18 DIAGNOSIS — I69191 Dysphagia following nontraumatic intracerebral hemorrhage: Secondary | ICD-10-CM | POA: Diagnosis not present

## 2019-08-18 DIAGNOSIS — I6300 Cerebral infarction due to thrombosis of unspecified precerebral artery: Secondary | ICD-10-CM | POA: Diagnosis not present

## 2019-08-18 DIAGNOSIS — J449 Chronic obstructive pulmonary disease, unspecified: Secondary | ICD-10-CM | POA: Diagnosis not present

## 2019-08-18 DIAGNOSIS — R1311 Dysphagia, oral phase: Secondary | ICD-10-CM | POA: Diagnosis not present

## 2019-08-18 DIAGNOSIS — G4700 Insomnia, unspecified: Secondary | ICD-10-CM | POA: Diagnosis not present

## 2019-08-18 DIAGNOSIS — M6281 Muscle weakness (generalized): Secondary | ICD-10-CM | POA: Diagnosis not present

## 2019-08-18 DIAGNOSIS — F0390 Unspecified dementia without behavioral disturbance: Secondary | ICD-10-CM | POA: Diagnosis not present

## 2019-08-20 DIAGNOSIS — R1311 Dysphagia, oral phase: Secondary | ICD-10-CM | POA: Diagnosis not present

## 2019-08-20 DIAGNOSIS — I69191 Dysphagia following nontraumatic intracerebral hemorrhage: Secondary | ICD-10-CM | POA: Diagnosis not present

## 2019-08-20 DIAGNOSIS — J449 Chronic obstructive pulmonary disease, unspecified: Secondary | ICD-10-CM | POA: Diagnosis not present

## 2019-08-20 DIAGNOSIS — M6281 Muscle weakness (generalized): Secondary | ICD-10-CM | POA: Diagnosis not present

## 2019-08-20 DIAGNOSIS — I6300 Cerebral infarction due to thrombosis of unspecified precerebral artery: Secondary | ICD-10-CM | POA: Diagnosis not present

## 2019-08-21 DIAGNOSIS — R1311 Dysphagia, oral phase: Secondary | ICD-10-CM | POA: Diagnosis not present

## 2019-08-21 DIAGNOSIS — I69191 Dysphagia following nontraumatic intracerebral hemorrhage: Secondary | ICD-10-CM | POA: Diagnosis not present

## 2019-08-21 DIAGNOSIS — M6281 Muscle weakness (generalized): Secondary | ICD-10-CM | POA: Diagnosis not present

## 2019-08-21 DIAGNOSIS — I6300 Cerebral infarction due to thrombosis of unspecified precerebral artery: Secondary | ICD-10-CM | POA: Diagnosis not present

## 2019-08-21 DIAGNOSIS — J449 Chronic obstructive pulmonary disease, unspecified: Secondary | ICD-10-CM | POA: Diagnosis not present

## 2019-09-01 DIAGNOSIS — F419 Anxiety disorder, unspecified: Secondary | ICD-10-CM | POA: Diagnosis not present

## 2019-09-01 DIAGNOSIS — G4700 Insomnia, unspecified: Secondary | ICD-10-CM | POA: Diagnosis not present

## 2019-09-01 DIAGNOSIS — F0390 Unspecified dementia without behavioral disturbance: Secondary | ICD-10-CM | POA: Diagnosis not present

## 2019-09-07 ENCOUNTER — Encounter: Payer: Self-pay | Admitting: Internal Medicine

## 2019-09-07 ENCOUNTER — Non-Acute Institutional Stay (SKILLED_NURSING_FACILITY): Payer: Medicare Other | Admitting: Internal Medicine

## 2019-09-07 DIAGNOSIS — Z86010 Personal history of colonic polyps: Secondary | ICD-10-CM

## 2019-09-07 DIAGNOSIS — I10 Essential (primary) hypertension: Secondary | ICD-10-CM | POA: Diagnosis not present

## 2019-09-07 DIAGNOSIS — D61818 Other pancytopenia: Secondary | ICD-10-CM | POA: Diagnosis not present

## 2019-09-07 NOTE — Assessment & Plan Note (Signed)
08/03/2019 hemoglobin 9.2/hematocrit 27 down from values of 9.9/29 on 06/11/2019.  White count 4900 and platelet count 158,000. She does have a history of colon polyposis; colonoscopy is not reasonable at her age with her comorbidities.  Low-dose iron will be initiated with repeat CBC in approximately 8 weeks.

## 2019-09-07 NOTE — Assessment & Plan Note (Addendum)
Blood pressure and pulse low today. This has been recent pattern as per [facility] record review.Adjust rate limiting antihypertensive.
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
## 2019-09-07 NOTE — Patient Instructions (Signed)
See assessment and plan under each diagnosis in the problem list and acutely for this visit 

## 2019-09-07 NOTE — Assessment & Plan Note (Signed)
Age and advanced irreversible comorbidities preclude repeating colonoscopy screening.

## 2019-09-13 DIAGNOSIS — Z23 Encounter for immunization: Secondary | ICD-10-CM | POA: Diagnosis not present

## 2019-09-16 ENCOUNTER — Encounter (HOSPITAL_COMMUNITY): Payer: Self-pay

## 2019-09-16 ENCOUNTER — Emergency Department (HOSPITAL_COMMUNITY): Payer: Medicare Other

## 2019-09-16 ENCOUNTER — Inpatient Hospital Stay (HOSPITAL_COMMUNITY)
Admission: EM | Admit: 2019-09-16 | Discharge: 2019-09-19 | DRG: 082 | Disposition: A | Discharge status: Skilled Nursing Facility | Payer: Medicare Other | Attending: Internal Medicine | Admitting: Internal Medicine

## 2019-09-16 DIAGNOSIS — N189 Chronic kidney disease, unspecified: Secondary | ICD-10-CM | POA: Diagnosis not present

## 2019-09-16 DIAGNOSIS — S065X0D Traumatic subdural hemorrhage without loss of consciousness, subsequent encounter: Secondary | ICD-10-CM | POA: Diagnosis not present

## 2019-09-16 DIAGNOSIS — S199XXA Unspecified injury of neck, initial encounter: Secondary | ICD-10-CM | POA: Diagnosis not present

## 2019-09-16 DIAGNOSIS — Z8616 Personal history of COVID-19: Secondary | ICD-10-CM

## 2019-09-16 DIAGNOSIS — S01112A Laceration without foreign body of left eyelid and periocular area, initial encounter: Secondary | ICD-10-CM | POA: Diagnosis present

## 2019-09-16 DIAGNOSIS — E43 Unspecified severe protein-calorie malnutrition: Secondary | ICD-10-CM | POA: Insufficient documentation

## 2019-09-16 DIAGNOSIS — Z79899 Other long term (current) drug therapy: Secondary | ICD-10-CM | POA: Diagnosis not present

## 2019-09-16 DIAGNOSIS — S065X0A Traumatic subdural hemorrhage without loss of consciousness, initial encounter: Secondary | ICD-10-CM | POA: Diagnosis not present

## 2019-09-16 DIAGNOSIS — Z955 Presence of coronary angioplasty implant and graft: Secondary | ICD-10-CM

## 2019-09-16 DIAGNOSIS — N390 Urinary tract infection, site not specified: Secondary | ICD-10-CM | POA: Diagnosis present

## 2019-09-16 DIAGNOSIS — I6200 Nontraumatic subdural hemorrhage, unspecified: Secondary | ICD-10-CM | POA: Diagnosis not present

## 2019-09-16 DIAGNOSIS — I130 Hypertensive heart and chronic kidney disease with heart failure and stage 1 through stage 4 chronic kidney disease, or unspecified chronic kidney disease: Secondary | ICD-10-CM | POA: Diagnosis present

## 2019-09-16 DIAGNOSIS — Z8249 Family history of ischemic heart disease and other diseases of the circulatory system: Secondary | ICD-10-CM | POA: Diagnosis not present

## 2019-09-16 DIAGNOSIS — E559 Vitamin D deficiency, unspecified: Secondary | ICD-10-CM | POA: Diagnosis present

## 2019-09-16 DIAGNOSIS — S065XAA Traumatic subdural hemorrhage with loss of consciousness status unknown, initial encounter: Secondary | ICD-10-CM

## 2019-09-16 DIAGNOSIS — R58 Hemorrhage, not elsewhere classified: Secondary | ICD-10-CM | POA: Diagnosis not present

## 2019-09-16 DIAGNOSIS — Z823 Family history of stroke: Secondary | ICD-10-CM

## 2019-09-16 DIAGNOSIS — I739 Peripheral vascular disease, unspecified: Secondary | ICD-10-CM | POA: Diagnosis not present

## 2019-09-16 DIAGNOSIS — R0902 Hypoxemia: Secondary | ICD-10-CM | POA: Diagnosis not present

## 2019-09-16 DIAGNOSIS — R41841 Cognitive communication deficit: Secondary | ICD-10-CM | POA: Diagnosis not present

## 2019-09-16 DIAGNOSIS — S0181XA Laceration without foreign body of other part of head, initial encounter: Secondary | ICD-10-CM

## 2019-09-16 DIAGNOSIS — Z66 Do not resuscitate: Secondary | ICD-10-CM | POA: Diagnosis present

## 2019-09-16 DIAGNOSIS — Z833 Family history of diabetes mellitus: Secondary | ICD-10-CM | POA: Diagnosis not present

## 2019-09-16 DIAGNOSIS — I5032 Chronic diastolic (congestive) heart failure: Secondary | ICD-10-CM | POA: Diagnosis present

## 2019-09-16 DIAGNOSIS — R609 Edema, unspecified: Secondary | ICD-10-CM | POA: Diagnosis not present

## 2019-09-16 DIAGNOSIS — W06XXXA Fall from bed, initial encounter: Secondary | ICD-10-CM | POA: Diagnosis present

## 2019-09-16 DIAGNOSIS — S0512XA Contusion of eyeball and orbital tissues, left eye, initial encounter: Secondary | ICD-10-CM | POA: Diagnosis not present

## 2019-09-16 DIAGNOSIS — E785 Hyperlipidemia, unspecified: Secondary | ICD-10-CM | POA: Diagnosis present

## 2019-09-16 DIAGNOSIS — R531 Weakness: Secondary | ICD-10-CM | POA: Diagnosis not present

## 2019-09-16 DIAGNOSIS — G9341 Metabolic encephalopathy: Secondary | ICD-10-CM | POA: Diagnosis present

## 2019-09-16 DIAGNOSIS — N1831 Chronic kidney disease, stage 3a: Secondary | ICD-10-CM | POA: Diagnosis present

## 2019-09-16 DIAGNOSIS — R4182 Altered mental status, unspecified: Secondary | ICD-10-CM | POA: Diagnosis not present

## 2019-09-16 DIAGNOSIS — Z8673 Personal history of transient ischemic attack (TIA), and cerebral infarction without residual deficits: Secondary | ICD-10-CM | POA: Diagnosis not present

## 2019-09-16 DIAGNOSIS — Z90710 Acquired absence of both cervix and uterus: Secondary | ICD-10-CM

## 2019-09-16 DIAGNOSIS — Z7401 Bed confinement status: Secondary | ICD-10-CM | POA: Diagnosis not present

## 2019-09-16 DIAGNOSIS — S0990XA Unspecified injury of head, initial encounter: Secondary | ICD-10-CM | POA: Diagnosis not present

## 2019-09-16 DIAGNOSIS — J449 Chronic obstructive pulmonary disease, unspecified: Secondary | ICD-10-CM | POA: Diagnosis not present

## 2019-09-16 DIAGNOSIS — I959 Hypotension, unspecified: Secondary | ICD-10-CM | POA: Diagnosis not present

## 2019-09-16 DIAGNOSIS — R404 Transient alteration of awareness: Secondary | ICD-10-CM | POA: Diagnosis not present

## 2019-09-16 DIAGNOSIS — M6281 Muscle weakness (generalized): Secondary | ICD-10-CM | POA: Diagnosis not present

## 2019-09-16 DIAGNOSIS — E876 Hypokalemia: Secondary | ICD-10-CM | POA: Diagnosis present

## 2019-09-16 DIAGNOSIS — Z515 Encounter for palliative care: Secondary | ICD-10-CM | POA: Diagnosis present

## 2019-09-16 DIAGNOSIS — F0390 Unspecified dementia without behavioral disturbance: Secondary | ICD-10-CM | POA: Diagnosis present

## 2019-09-16 DIAGNOSIS — N179 Acute kidney failure, unspecified: Secondary | ICD-10-CM | POA: Diagnosis present

## 2019-09-16 DIAGNOSIS — E46 Unspecified protein-calorie malnutrition: Secondary | ICD-10-CM | POA: Diagnosis not present

## 2019-09-16 DIAGNOSIS — I2510 Atherosclerotic heart disease of native coronary artery without angina pectoris: Secondary | ICD-10-CM | POA: Diagnosis present

## 2019-09-16 DIAGNOSIS — I872 Venous insufficiency (chronic) (peripheral): Secondary | ICD-10-CM | POA: Diagnosis not present

## 2019-09-16 DIAGNOSIS — W19XXXA Unspecified fall, initial encounter: Secondary | ICD-10-CM

## 2019-09-16 DIAGNOSIS — R220 Localized swelling, mass and lump, head: Secondary | ICD-10-CM | POA: Diagnosis not present

## 2019-09-16 DIAGNOSIS — B9689 Other specified bacterial agents as the cause of diseases classified elsewhere: Secondary | ICD-10-CM | POA: Diagnosis not present

## 2019-09-16 DIAGNOSIS — Z9181 History of falling: Secondary | ICD-10-CM | POA: Diagnosis not present

## 2019-09-16 DIAGNOSIS — R1311 Dysphagia, oral phase: Secondary | ICD-10-CM | POA: Diagnosis not present

## 2019-09-16 DIAGNOSIS — U071 COVID-19: Secondary | ICD-10-CM | POA: Diagnosis not present

## 2019-09-16 DIAGNOSIS — Z87891 Personal history of nicotine dependence: Secondary | ICD-10-CM | POA: Diagnosis not present

## 2019-09-16 DIAGNOSIS — I69398 Other sequelae of cerebral infarction: Secondary | ICD-10-CM | POA: Diagnosis not present

## 2019-09-16 DIAGNOSIS — M2550 Pain in unspecified joint: Secondary | ICD-10-CM | POA: Diagnosis not present

## 2019-09-16 DIAGNOSIS — S065X9A Traumatic subdural hemorrhage with loss of consciousness of unspecified duration, initial encounter: Secondary | ICD-10-CM | POA: Diagnosis not present

## 2019-09-16 DIAGNOSIS — S0181XD Laceration without foreign body of other part of head, subsequent encounter: Secondary | ICD-10-CM | POA: Diagnosis not present

## 2019-09-16 DIAGNOSIS — G9340 Encephalopathy, unspecified: Secondary | ICD-10-CM | POA: Diagnosis not present

## 2019-09-16 DIAGNOSIS — N183 Chronic kidney disease, stage 3 unspecified: Secondary | ICD-10-CM | POA: Diagnosis not present

## 2019-09-16 DIAGNOSIS — Y92129 Unspecified place in nursing home as the place of occurrence of the external cause: Secondary | ICD-10-CM

## 2019-09-16 LAB — PROTIME-INR
INR: 1.1 (ref 0.8–1.2)
Prothrombin Time: 14 seconds (ref 11.4–15.2)

## 2019-09-16 LAB — COMPREHENSIVE METABOLIC PANEL
ALT: 11 U/L (ref 0–44)
AST: 18 U/L (ref 15–41)
Albumin: 2.8 g/dL — ABNORMAL LOW (ref 3.5–5.0)
Alkaline Phosphatase: 48 U/L (ref 38–126)
Anion gap: 14 (ref 5–15)
BUN: 62 mg/dL — ABNORMAL HIGH (ref 8–23)
CO2: 24 mmol/L (ref 22–32)
Calcium: 8.9 mg/dL (ref 8.9–10.3)
Chloride: 103 mmol/L (ref 98–111)
Creatinine, Ser: 3.01 mg/dL — ABNORMAL HIGH (ref 0.44–1.00)
GFR calc Af Amer: 15 mL/min — ABNORMAL LOW (ref >60)
GFR calc non Af Amer: 13 mL/min — ABNORMAL LOW (ref >60)
Glucose, Bld: 103 mg/dL — ABNORMAL HIGH (ref 70–99)
Potassium: 3.7 mmol/L (ref 3.5–5.1)
Sodium: 141 mmol/L (ref 135–145)
Total Bilirubin: 1.1 mg/dL (ref 0.3–1.2)
Total Protein: 6.5 g/dL (ref 6.5–8.1)

## 2019-09-16 LAB — CBC WITH DIFFERENTIAL/PLATELET
Abs Immature Granulocytes: 0.02 10*3/uL (ref 0.00–0.07)
Basophils Absolute: 0 10*3/uL (ref 0.0–0.1)
Basophils Relative: 0 %
Eosinophils Absolute: 0 10*3/uL (ref 0.0–0.5)
Eosinophils Relative: 0 %
HCT: 28.6 % — ABNORMAL LOW (ref 36.0–46.0)
Hemoglobin: 8.9 g/dL — ABNORMAL LOW (ref 12.0–15.0)
Immature Granulocytes: 0 %
Lymphocytes Relative: 27 %
Lymphs Abs: 1.5 10*3/uL (ref 0.7–4.0)
MCH: 30.8 pg (ref 26.0–34.0)
MCHC: 31.1 g/dL (ref 30.0–36.0)
MCV: 99 fL (ref 80.0–100.0)
Monocytes Absolute: 0.5 10*3/uL (ref 0.1–1.0)
Monocytes Relative: 9 %
Neutro Abs: 3.6 10*3/uL (ref 1.7–7.7)
Neutrophils Relative %: 64 %
Platelets: 158 10*3/uL (ref 150–400)
RBC: 2.89 MIL/uL — ABNORMAL LOW (ref 3.87–5.11)
RDW: 14.2 % (ref 11.5–15.5)
WBC: 5.6 10*3/uL (ref 4.0–10.5)
nRBC: 0 % (ref 0.0–0.2)

## 2019-09-16 MED ORDER — LIDOCAINE-EPINEPHRINE (PF) 2 %-1:200000 IJ SOLN: 20.0000 mL | Freq: Once | INTRAMUSCULAR | Status: AC

## 2019-09-16 MED ORDER — PRO-STAT SUGAR FREE PO LIQD
30.0000 mL | Freq: Two times a day (BID) | ORAL | Status: DC
  Administered 2019-09-17 – 2019-09-19 (×5): 30 mL
  Filled 2019-09-16 (×7): qty 30

## 2019-09-16 MED ORDER — PROMETHAZINE HCL 25 MG PO TABS: 12.5000 mg | ORAL_TABLET | Freq: Four times a day (QID) | ORAL | Status: DC | PRN

## 2019-09-16 MED ORDER — FERROUS SULFATE 325 (65 FE) MG PO TABS
325.0000 mg | ORAL_TABLET | Freq: Every day | ORAL | Status: DC
  Administered 2019-09-17 – 2019-09-19 (×3): 325 mg via ORAL
  Filled 2019-09-16 (×3): qty 1

## 2019-09-16 MED ORDER — OCUSOFT LID SCRUB PLUS EX PADS: 1.0000 | MEDICATED_PAD | CUTANEOUS | Status: DC

## 2019-09-16 MED ORDER — FUROSEMIDE 20 MG PO TABS: 20.0000 mg | ORAL_TABLET | Freq: Every day | ORAL | Status: DC

## 2019-09-16 MED ORDER — SENNOSIDES-DOCUSATE SODIUM 8.6-50 MG PO TABS: 1.0000 | ORAL_TABLET | Freq: Every evening | ORAL | Status: DC | PRN

## 2019-09-16 MED ORDER — POLYVINYL ALCOHOL 1.4 % OP SOLN
1.0000 [drp] | Freq: Two times a day (BID) | OPHTHALMIC | Status: DC
  Administered 2019-09-17 – 2019-09-19 (×4): 1 [drp] via OPHTHALMIC
  Filled 2019-09-16 (×2): qty 15

## 2019-09-16 MED ORDER — SENNA 8.6 MG PO TABS
2.0000 | ORAL_TABLET | Freq: Every day | ORAL | Status: DC
  Administered 2019-09-17 – 2019-09-18 (×2): 17.2 mg via ORAL
  Filled 2019-09-16 (×2): qty 2

## 2019-09-16 MED ORDER — ATORVASTATIN CALCIUM 40 MG PO TABS
40.0000 mg | ORAL_TABLET | Freq: Every day | ORAL | Status: DC
  Administered 2019-09-17: 40 mg via ORAL
  Filled 2019-09-16 (×2): qty 1

## 2019-09-16 MED ORDER — MIRTAZAPINE 15 MG PO TBDP
15.0000 mg | ORAL_TABLET | Freq: Every day | ORAL | Status: DC
  Administered 2019-09-17 (×2): 15 mg via ORAL
  Filled 2019-09-16 (×2): qty 1

## 2019-09-16 MED ORDER — PANTOPRAZOLE SODIUM 40 MG PO PACK
40.0000 mg | PACK | Freq: Every day | ORAL | Status: DC
  Administered 2019-09-17 – 2019-09-19 (×2): 40 mg via ORAL
  Filled 2019-09-16 (×3): qty 20

## 2019-09-16 MED ORDER — ALBUTEROL SULFATE (2.5 MG/3ML) 0.083% IN NEBU: 3.0000 mL | INHALATION_SOLUTION | Freq: Four times a day (QID) | RESPIRATORY_TRACT | Status: DC | PRN

## 2019-09-16 MED ORDER — LIDOCAINE-EPINEPHRINE (PF) 2 %-1:200000 IJ SOLN
INTRAMUSCULAR | Status: AC
  Administered 2019-09-16: 20 mL
  Filled 2019-09-16: qty 20

## 2019-09-16 MED ORDER — BISACODYL 10 MG RE SUPP: 10.0000 mg | Freq: Every day | RECTAL | Status: DC | PRN

## 2019-09-16 NOTE — Consult Note (Signed)
Chief Complaint   Chief Complaint  Patient presents with  . Fall    HPI   Consult requested by: Dr Ralene Bathe, [facility] Reason for consult: SDH  HPI: Jane [name] is a 86 y.o. female with history of dementia, CHF, CAD, CKD, PAD, HTN and CVA who was brought to ED after a fall out of bed. Patient is unable to provide any history. History obtained via chart review and with discussion with EDP. Patient resides in nursing home. She was reaching for something and fell out of bed. Did strike head, unknown LOC. Lac to left eye brow. Underwent head CT which revealed an acute on chronic SDH. A NSY consultation was requested.  Of note, patient was admitted in September 2020 for IVH. She has not had any imaging of her head since her admission in September.  Patient Active Problem List   Diagnosis Date Noted  . History of colon polyps 09/07/2019  . Dry eyes 07/01/2019  . Poor appetite 06/27/2019  . Weight loss 06/27/2019  . Advance care planning 06/17/2019  . Acute renal failure superimposed on stage 3 chronic kidney disease ([place]) 03/29/2019  . Thrombocytopenia ([place]) 03/29/2019  . E coli bacteremia 03/29/2019  . IVH (intraventricular hemorrhage) ([place]) 03/19/2019  . Cytotoxic brain edema ([place]) 03/19/2019  . Pressure injury of skin 03/18/2019  . Stroke (cerebrum) ([place]) 03/17/2019  . Urge incontinence 11/18/2018  . Acute exacerbation of CHF (congestive heart failure) ([place]) 10/20/2018  . Acute on chronic heart failure with preserved ejection fraction (HFpEF) ([place]) 07/25/2018  . Hypokalemia 07/25/2018  . Cellulitis of left lower extremity   . Toenail deformity 03/19/2018  . Skin lesion of scalp 03/19/2018  . Coronary artery disease involving native coronary artery of native heart with angina pectoris ([place]) 08/25/2017  . Unsteady gait 08/11/2016  . Abdominal aortic aneurysm ([place]) 05/21/2016  . Trochanteric bursitis of left hip   . Pancytopenia ([place]) 12/27/2015  . CKD (chronic kidney disease)  03/31/2015  . Prediabetes 10/16/2014  . Status post insertion of drug-eluting stent into right coronary artery for coronary artery disease 08/07/2014  . SOB (shortness of breath) 05/31/2014  . Physical deconditioning 05/31/2014  . Heart failure with preserved ejection fraction ([place]) 12/27/2012  . Ectropion of left lower eyelid 12/17/2012  . Onychomycosis of toenail 09/18/2012  . PAD (peripheral artery disease) ([place]) 09/18/2012  . Preventative health care 09/18/2012  . Vitamin D deficiency 04/25/2008  . History of cerebrovascular accident 10/29/2007  . Hyperlipidemia 09/24/2006  . Anemia, unspecified 09/24/2006  . Hypertension 09/24/2006  . OSTEOPENIA 09/24/2006  . Vitamin B12 deficiency 08/29/2006    PMH: Past Medical History:  Diagnosis Date  . Anemia   . Aortic insufficiency    mild  . Arthritis    "arms" (08/09/2015)  . CAD (coronary artery disease)    cath 08/09/2014 95% stenosis in prox to mid RCA s/p DES, 80-90% prox OM2, 50% distal LAD  . CHF (congestive heart failure) ([place])   . CKD (chronic kidney disease) stage 3, GFR 30-59 ml/min   . Colon polyp    2009 colonoscopy, not retrieved for pathology  . Dyspnea   . Ectropion of left lower eyelid   . GERD (gastroesophageal reflux disease) 2009   EGD with benign gastric polyp too  . Hyperlipidemia   . Hypertension   . Pneumonia    history of  . Stroke ([place]) 1975  . Vitamin D deficiency     PSH: Past Surgical History:  Procedure Laterality Date  .  ABDOMINAL HYSTERECTOMY    . APPENDECTOMY    . ARTERY BIOPSY Left 12/16/2012   Procedure: BIOPSY TEMPORAL ARTERY;  Surgeon: Mal Misty, MD;  Location: [facility];  Service: Vascular;  Laterality: Left;  . CARDIAC CATHETERIZATION    . LEFT HEART CATHETERIZATION WITH CORONARY ANGIOGRAM N/A 08/09/2014   Procedure: LEFT HEART CATHETERIZATION WITH CORONARY ANGIOGRAM;  Surgeon: Peter M Martinique, MD;  Location: [facility] CATH LAB;  Service: Cardiovascular;  Laterality: N/A;  . PERCUTANEOUS  CORONARY STENT INTERVENTION (PCI-S)  08/09/2014   Procedure: PERCUTANEOUS CORONARY STENT INTERVENTION (PCI-S);  Surgeon: Peter M Martinique, MD;  Location: [facility] CATH LAB;  Service: Cardiovascular;;  . TONSILLECTOMY      (Not in a hospital admission)   SH: Social History   Tobacco Use  . Smoking status: Former Smoker    Types: Cigarettes    Quit date: 07/02/1979    Years since quitting: 40.2  . Smokeless tobacco: Former Systems developer  . Tobacco comment: Started in teenage years - Quit 1980  Substance Use Topics  . Alcohol use: No    Alcohol/week: 0.0 standard drinks    Comment: Quit alcohol 1975-76  . Drug use: No    MEDS: Prior to Admission medications   Medication Sig Start Date End Date Taking? Authorizing Provider  acetaminophen (TYLENOL) 325 MG tablet Take 650 mg by mouth every 6 (six) hours as needed for mild pain or headache.     [provider]  albuterol (VENTOLIN HFA) 108 (90 Base) MCG/ACT inhaler Inhale 1 puff into the lungs every 6 (six) hours as needed for wheezing or shortness of breath.    [provider]  Amino Acids-Protein Hydrolys (FEEDING SUPPLEMENT, PRO-STAT SUGAR FREE 64,) LIQD Place 30 mLs into feeding tube 2 (two) times daily. 03/27/19   Hosie Poisson, MD  amLODipine (NORVASC) 10 MG tablet Take 1 tablet (10 mg total) by mouth daily. 03/28/19   Hosie Poisson, MD  atorvastatin (LIPITOR) 40 MG tablet Take 1 tablet (40 mg total) by mouth daily at 6 PM. 09/18/18   Granville Lewis C, PA-C  bisacodyl (DULCOLAX) 10 MG suppository If not relieved by MOM, give 10 mg Bisacodyl suppositiory rectally X 1 dose in 24 hours as needed (Do not use constipation standing orders for residents with renal failure/CFR less than 30. Contact MD for orders) (Physician Order)    [provider]  bisoprolol (ZEBETA) 10 MG tablet Place 1 tablet (10 mg total) into feeding tube 2 (two) times daily. 03/27/19   Hosie Poisson, MD  Eyelid Cleansers (OCUSOFT LID SCRUB PLUS) PADS CLEANSE  BILATERAL EYELASHES ONCE DAILY DX: DRY EYES    [provider]  feeding supplement, ENSURE ENLIVE, (ENSURE ENLIVE) LIQD Take 237 mLs by mouth 2 (two) times daily between meals. 03/27/19   Hosie Poisson, MD  furosemide (LASIX) 20 MG tablet TAKE 1 TABLET(20 MG) BY MOUTH DAILY 11/18/18   Asencion Noble, MD  isosorbide mononitrate (IMDUR) 60 MG 24 hr tablet Take 1 tablet (60 mg total) by mouth daily. 03/28/19   Hosie Poisson, MD  losartan (COZAAR) 100 MG tablet TAKE 1 TABLET(100 MG) BY MOUTH DAILY 11/18/18   Asencion Noble, MD  magnesium hydroxide (MILK OF MAGNESIA) 400 MG/5ML suspension Take 30 mLs by mouth daily as needed for mild constipation.     [provider]  mirtazapine (REMERON SOL-TAB) 15 MG disintegrating tablet Take 15 mg by mouth at bedtime.    [provider]  NON FORMULARY Magic cup once daily to  help prevent weight loss    [provider]  Nutritional Supplement LIQD Take 120 mLs by mouth 3 (three) times daily. MedPass    [provider]  pantoprazole sodium (PROTONIX) 40 mg/20 mL PACK Take 20 mLs (40 mg total) by mouth daily. 03/28/19   Hosie Poisson, MD  polyvinyl alcohol (ARTIFICIAL TEARS) 1.4 % ophthalmic solution Place 1 drop into both eyes 2 (two) times daily. For dry eyes 09/18/18   Granville Lewis C, PA-C  senna (SENOKOT) 8.6 MG TABS tablet Take 2 tablets by mouth at bedtime.    [provider]  Sodium Phosphates (RA SALINE ENEMA RE) If not relieved by Biscodyl suppository, give disposable Saline Enema rectally X 1 dose/24 hrs as needed (Do not use constipation standing orders for residents with renal failure/CFR less than 30. Contact MD for orders)(Physician Or    [provider]    ALLERGY: No Known Allergies  Social History   Tobacco Use  . Smoking status: Former Smoker    Types: Cigarettes    Quit date: 07/02/1979    Years since quitting: 40.2  . Smokeless tobacco: Former Systems developer  . Tobacco comment: Started in  teenage years - Quit 1980  Substance Use Topics  . Alcohol use: No    Alcohol/week: 0.0 standard drinks    Comment: Quit alcohol 1975-76     Family History  Problem Relation Age of Onset  . Stroke Mother   . Hypertension Mother   . Stroke Father   . Hypertension Father   . Diabetes Sister      ROS   ROS  Exam   Vitals:   09/16/19 1738 09/16/19 2035  BP: 121/62 109/71  Pulse: 74 71  Resp: 17 17  Temp: (!) 97.1 F (36.2 C)   SpO2: 96% 100%   General appearance: elderly female, pleasantly confused. Lac superior to left eye brow. Dried blood. Periorbital bruising. Eyes: No scleral injection Cardiovascular: Regular rate and rhythm without murmurs, rubs, gallops. No edema or variciosities. Distal pulses normal. Pulmonary: Effort normal, non-labored breathing Musculoskeletal:     Muscle tone upper extremities: Normal    Muscle tone lower extremities: Normal    Motor exam:  Upper Extremities Deltoid Bicep Tricep Grip  Right 5/5 5/5 5/5 5/5  Left 5/5 5/5 5/5 5/5   Lower Extremity IP Quad PF DF EHL  Right 5/5 5/5 5/5 5/5 5/5  Left 5/5 5/5 5/5 5/5 5/5   Neurological Mental Status:    - Patient is awake, alert, oriented to self. Occasionally gets location right. Not year.    - Patient is unable to give a clear and coherent history.    - No signs of aphasia or neglect Cranial Nerves    - II: Visual Fields are full. PERRL    - III/IV/VI: EOMI, ectropion bilaterally    - V: Facial sensation is grossly normal    - VII: Facial movement is symmetric.     - VIII: hearing is intact to voice    - X: Uvula elevates symmetrically    - XI: Shoulder shrug is symmetric.    - XII: tongue is midline without atrophy or fasciculations.  Sensory: Sensation grossly intact to LT  Results - Imaging/Labs   Results for orders placed or performed during the hospital encounter of 09/16/19 (from the past 48 hour(s))  CBC with Differential     Status: Abnormal   Collection Time: 09/16/19   8:30 PM  Result Value Ref Range   WBC  5.6 4.0 - 10.5 K/uL   RBC 2.89 (L) 3.87 - 5.11 MIL/uL   Hemoglobin 8.9 (L) 12.0 - 15.0 g/dL   HCT 28.6 (L) 36.0 - 46.0 %   MCV 99.0 80.0 - 100.0 fL   MCH 30.8 26.0 - 34.0 pg   MCHC 31.1 30.0 - 36.0 g/dL   RDW 14.2 11.5 - 15.5 %   Platelets 158 150 - 400 K/uL   nRBC 0.0 0.0 - 0.2 %   Neutrophils Relative % 64 %   Neutro Abs 3.6 1.7 - 7.7 K/uL   Lymphocytes Relative 27 %   Lymphs Abs 1.5 0.7 - 4.0 K/uL   Monocytes Relative 9 %   Monocytes Absolute 0.5 0.1 - 1.0 K/uL   Eosinophils Relative 0 %   Eosinophils Absolute 0.0 0.0 - 0.5 K/uL   Basophils Relative 0 %   Basophils Absolute 0.0 0.0 - 0.1 K/uL   Immature Granulocytes 0 %   Abs Immature Granulocytes 0.02 0.00 - 0.07 K/uL    Comment: Performed at [facility] [address] [place], [place] [address]  Protime-INR     Status: None   Collection Time: 09/16/19  8:30 PM  Result Value Ref Range   Prothrombin Time 14.0 11.4 - 15.2 seconds   INR 1.1 0.8 - 1.2    Comment: (NOTE) INR goal varies based on device and disease states. Performed at [facility], [place] [address] [place], [place] [address]     CT Head Wo Contrast  Result Date: 09/16/2019 CLINICAL DATA:  Head injury.  Laceration to left eyebrow. EXAM: CT HEAD WITHOUT CONTRAST CT CERVICAL SPINE WITHOUT CONTRAST TECHNIQUE: Multidetector CT imaging of the head and cervical spine was performed following the standard protocol without intravenous contrast. Multiplanar CT image reconstructions of the cervical spine were also generated. COMPARISON:  CT head dated 03/21/2019. FINDINGS: CT HEAD FINDINGS Brain: There is mixed acute and chronic extra-axial hemorrhage along the right frontoparietal convexity. This collection measures up to approximately 6 mm in thickness. There is a curvilinear hyperdense area along the margins of the left lateral ventricle (axial series 3, image 20). This may represent a small volume of acute  intraventricular hemorrhage. Atrophy and chronic microvascular ischemic changes are noted. There are old right basal ganglia lacunar infarcts. There is no significant midline shift. No mass effect. Vascular: No hyperdense vessel or unexpected calcification. Skull: There is left frontal scalp swelling without evidence for an underlying fracture. Sinuses/Orbits: No acute finding. Other: None. CT CERVICAL SPINE FINDINGS Alignment: There is straightening and slight reversal of the normal cervical lordotic curvature. Skull base and vertebrae: No acute fracture. No primary bone lesion or focal pathologic process. Soft tissues and spinal canal: No prevertebral fluid or swelling. No visible canal hematoma. Disc levels: Multilevel disc height loss is noted throughout the cervical spine, which is moderate to severe. Multilevel osseous neural foraminal stenosis is noted. Upper chest: Negative. Other: None IMPRESSION: 1. Mixed acute on chronic right-sided subdural hematoma as detailed above without evidence for significant midline shift or mass effect. 2. Findings concerning for small volume acute intraventricular hemorrhage within the left lateral ventricle. 3. Advanced atrophy and chronic microvascular ischemic changes are again noted. 4. No acute cervical spine fracture. 5. Left frontal scalp swelling without evidence for an underlying fracture. These results were called by telephone at the time of interpretation on 09/16/2019 at 7:55 pm to provider [facility] , who verbally acknowledged these results. Electronically Signed   By: Harrell Gave  Green M.D.   On: 09/16/2019 19:58   CT Cervical Spine Wo Contrast  Result Date: 09/16/2019 CLINICAL DATA:  Head injury.  Laceration to left eyebrow. EXAM: CT HEAD WITHOUT CONTRAST CT CERVICAL SPINE WITHOUT CONTRAST TECHNIQUE: Multidetector CT imaging of the head and cervical spine was performed following the standard protocol without intravenous contrast. Multiplanar CT image  reconstructions of the cervical spine were also generated. COMPARISON:  CT head dated 03/21/2019. FINDINGS: CT HEAD FINDINGS Brain: There is mixed acute and chronic extra-axial hemorrhage along the right frontoparietal convexity. This collection measures up to approximately 6 mm in thickness. There is a curvilinear hyperdense area along the margins of the left lateral ventricle (axial series 3, image 20). This may represent a small volume of acute intraventricular hemorrhage. Atrophy and chronic microvascular ischemic changes are noted. There are old right basal ganglia lacunar infarcts. There is no significant midline shift. No mass effect. Vascular: No hyperdense vessel or unexpected calcification. Skull: There is left frontal scalp swelling without evidence for an underlying fracture. Sinuses/Orbits: No acute finding. Other: None. CT CERVICAL SPINE FINDINGS Alignment: There is straightening and slight reversal of the normal cervical lordotic curvature. Skull base and vertebrae: No acute fracture. No primary bone lesion or focal pathologic process. Soft tissues and spinal canal: No prevertebral fluid or swelling. No visible canal hematoma. Disc levels: Multilevel disc height loss is noted throughout the cervical spine, which is moderate to severe. Multilevel osseous neural foraminal stenosis is noted. Upper chest: Negative. Other: None IMPRESSION: 1. Mixed acute on chronic right-sided subdural hematoma as detailed above without evidence for significant midline shift or mass effect. 2. Findings concerning for small volume acute intraventricular hemorrhage within the left lateral ventricle. 3. Advanced atrophy and chronic microvascular ischemic changes are again noted. 4. No acute cervical spine fracture. 5. Left frontal scalp swelling without evidence for an underlying fracture. These results were called by telephone at the time of interpretation on 09/16/2019 at 7:55 pm to provider [facility] , who verbally  acknowledged these results. Electronically Signed   By: Constance Holster M.D.   On: 09/16/2019 19:58   Impression/Plan   86 y.o. female found to have a mixed right sided acute on chronic SDH after a witnessed fall at her nursing home. Patient has dementia and unable to provide any history but is reportedly at baseline. SDH is small, there is no resultant MLS or mass effect. There is no role for NS intervention. Rec admission under [place] for monitoring. Repeat head CT in the am, although will not change NS plan of care - may help with prognosis. Patient is a DNR. With her other comorbidities, should this worsen we would rec transition to comfort care.  Please call for any concerns.  Ferne Reus, PA-C [facility] [employer]

## 2019-09-16 NOTE — H&P (Addendum)
Date: 09/16/2019               Jane [name] Name:  Jane [name] MRN: [id number]  DOB: 06-Jan-1929 Age / Sex: 86 y.o., female   PCP: Ina Homes, MD         Medical Service: Internal Medicine Teaching Service         Attending Physician: Dr. Quintella Reichert, MD    First Contact: Dr. Darrick Meigs Pager: [phone]  Second Contact: Dr. Sharon Seller Pager: [phone]       After Hours (After 5p/  First Contact Pager: [phone]  weekends / holidays): Second Contact Pager: [phone]   Chief Complaint: Fall  History of Present Illness:  Jane [name] is an 86 y/o female with a PMH of dementia, CVA, Abdominal aortic anuerysm, CKD, HTN, and anemia, who presents to [facility] after a fall. Jane [name] was found face down on the floor beside her bed at 3:00-4:00 p.m. on 09/16/19 by housing staff. Jane [name] is unsure if Jane [name] lost consciousness during this fall. Jane [name]'s last Staff attribute the fall to her dementia and believing that Jane [name] can walk unassisted. Jane [name] has had previous fall episodes, with the last occurrence happening in the last 30-60 days. Of note, Jane [name] had a previous unwitnessed fall event on 03/16/20 and was found to have a large volume intraventricular and small volume extra axial SAH. Typically, Jane [name] needs a 1 person to assist in ambulation, but over the past several days Jane [name] has needed assistance of a mechanical lift. Jane [name] denies acute vision changes, headaches, nausea, vomiting, abdominal pain, diarrhea, or constipation. Jane [name] endorses "soreness" above her L eye and pain in all her joints.   Information was obtained on chart review, speaking with nursing staff at [facility], and Jane [name] interview.  During her ED course, it was found that Jane [name] had a an acute on chronic R sided subdural hematoma with no evidence of midline shift. Additionally Jane [name] was found to have an acute on chronic kidney injury with a Cr 3.01 and reduced GFR of 15. Neurosurgery was consulted, and IMTS admitting.     Meds:  Current Meds    Medication Sig   acetaminophen (TYLENOL) 325 MG tablet Take 650 mg by mouth every 6 (six) hours as needed for mild pain or headache.    albuterol (VENTOLIN HFA) 108 (90 Base) MCG/ACT inhaler Inhale 1 puff into the lungs every 6 (six) hours as needed for wheezing or shortness of breath.   bisacodyl (DULCOLAX) 10 MG suppository Place 10 mg rectally daily as needed (for constipation not relieved by Milk of Magnesia).    magnesium hydroxide (MILK OF MAGNESIA) 400 MG/5ML suspension Take 30 mLs by mouth daily as needed for mild constipation.    Sodium Phosphates (RA SALINE ENEMA RE) Place 1 enema rectally daily as needed (for constipation not relieved by Dulcolax suppository and call MD if no relief from enema). If not relieved by Biscodyl suppository, give disposable Saline Enema rectally X 1 dose/24 hrs as needed (Do not use constipation standing orders for residents with renal failure/CFR less than 30. Contact MD for orders)(Physician Or    Allergies: Allergies as of 09/16/2019   (No Known Allergies)   Past Medical History:  Diagnosis Date   Anemia    Aortic insufficiency    mild   Arthritis    "arms" (08/09/2015)   CAD (coronary artery disease)    cath 08/09/2014 95% stenosis in prox to mid RCA s/p DES, 80-90% prox OM2, 50% distal LAD  CHF (congestive heart failure) (HCC)    CKD (chronic kidney disease) stage 3, GFR 30-59 ml/min    Colon polyp    2009 colonoscopy, not retrieved for pathology   Dyspnea    Ectropion of left lower eyelid    GERD (gastroesophageal reflux disease) 2009   EGD with benign gastric polyp too   Hyperlipidemia    Hypertension    Pneumonia    history of   Stroke ([place]) 1975   Vitamin D deficiency     Family History:  Diabetes:  - Sister Cardiac:  - Stroke: mother and father - Hypertension: mother and father Cancer: Unable to attain.    Social History:  Lives at a retirement facility.  - Former tobacco use, quite [id number] - Denies illicit substance use -  Former ETOH use, quit 1975  Review of Systems: A complete ROS was negative except as per HPI.   Physical Exam: Blood pressure 106/89, pulse 69, temperature (!) 97.1 F (36.2 C), temperature source Tympanic, resp. rate 16, height 4\' 10"  (1.473 m), weight 51 kg, SpO2 99 %. Physical Exam Vitals and nursing note reviewed.  Constitutional:      General: Jane [name] is not in acute distress.    Appearance: Normal appearance.     Comments: Pleasant, sitting comfortably in no acute distress.   HENT:     Head: Normocephalic.     Comments: Laceration superior to left eye with periorbital ecchymosis.        Mouth/Throat:     Mouth: Mucous membranes are moist.     Pharynx: No oropharyngeal exudate or posterior oropharyngeal erythema.  Eyes:     General:        Right eye: No discharge.        Left eye: No discharge.     Extraocular Movements: Extraocular movements intact.     Conjunctiva/sclera: Conjunctivae normal.     Pupils: Pupils are equal, round, and reactive to light.  Cardiovascular:     Rate and Rhythm: Normal rate and regular rhythm.     Pulses: Normal pulses.     Heart sounds: Normal heart sounds. No murmur. No friction rub. No gallop.   Pulmonary:     Effort: Pulmonary effort is normal.     Breath sounds: Normal breath sounds. No wheezing, rhonchi or rales.  Abdominal:     General: Abdomen is flat. Bowel sounds are normal.     Palpations: Abdomen is soft.     Tenderness: There is no abdominal tenderness. There is no guarding.  Musculoskeletal:     Comments: 5/5 strength noted in the upper and lower extremities bilaterally.  Skin:    General: Skin is warm and dry.     Findings: No rash.  Neurological:     Mental Status: Jane [name] is alert.     Comments: CN II-VII IV-XII intact. CN VIII intact to strong voice bilaterally, appreciates soft noises to the L ear.   Alert to person and place, but not time. CBC    Component Value Date/Time   WBC 5.6 09/16/2019 2030   RBC 2.89 (L)  09/16/2019 2030   HGB 8.9 (L) 09/16/2019 2030   HGB 9.9 (L) 08/15/2016 1408   HCT 28.6 (L) 09/16/2019 2030   HCT 28.6 (L) 08/15/2016 1408   PLT 158 09/16/2019 2030   PLT 113 (L) 08/15/2016 1408   MCV 99.0 09/16/2019 2030   MCV 91 08/15/2016 1408   MCH 30.8 09/16/2019 2030   MCHC 31.1 09/16/2019 2030  RDW 14.2 09/16/2019 2030   RDW 13.7 08/15/2016 1408   LYMPHSABS 1.5 09/16/2019 2030   LYMPHSABS 1.8 02/01/2016 1538   MONOABS 0.5 09/16/2019 2030   EOSABS 0.0 09/16/2019 2030   EOSABS 0.0 02/01/2016 1538   BASOSABS 0.0 09/16/2019 2030   BASOSABS 0.0 02/01/2016 1538   CMP Latest Ref Rng & Units 09/16/2019 08/03/2019 07/29/2019  Glucose 70 - 99 mg/dL 103(H) - -  BUN 8 - 23 mg/dL 62(H) 41(A) 34(A)  Creatinine 0.44 - 1.00 mg/dL 3.01(H) 1.3(A) 1.2(A)  Sodium 135 - 145 mmol/L 141 140 138  Potassium 3.5 - 5.1 mmol/L 3.7 4.1 3.8  Chloride 98 - 111 mmol/L 103 103 102  CO2 22 - 32 mmol/L 24 23(A) 24(A)  Calcium 8.9 - 10.3 mg/dL 8.9 9.1 8.8  Total Protein 6.5 - 8.1 g/dL 6.5 - -  Total Bilirubin 0.3 - 1.2 mg/dL 1.1 - -  Alkaline Phos 38 - 126 U/L 48 - -  AST 15 - 41 U/L 18 - -  ALT 0 - 44 U/L 11 - -     EKG: personally reviewed my interpretation is sinus tachycardia with paired PVCs  CT Head WO Contrast: Acute on Chronic R sided subdural hematoma, no midline shift present. Small volume acute intraventricular hemorrhage within the L lateral Ventricle.   CT Cervical Spine: No spinal fracture/trauma present.   Assessment & Plan by Problem: Active Problems:   * No active hospital problems. *  Jacquelynn Klinger is an 86y/o female with a PMH of dementia, CVA, Abdominal aortic anuerysm, CKD, HTN, and anemia who presents to the IMTS with an acute subdural Hematoma.  Jane [name] is an elderly female with dementia who was found down by nursing staff at 1500. Jane [name] has had falls in the past with the most traumatic occurring on 03/16/2020, where Jane [name] had a large IVH with associated SAH. After speaking  with house staff at [facility], it has been noted that Jane [name] has had unintentional weight loss from March 2020 of 152.6 lb to 108 March 2021. Couple her weight loss with recent COVID infection in February 2021, where resident was restricted to her room, seems likely that Jane [name] is deconditioned. Likely deconditioned Jane [name] with dementia had a mechanical fall after trying to ambulate unassisted.     Acute on Chronic R Sided Subdural Hematoma:  - Appreciate neurosurgery's recommendations.  - Repeat Head CT in the AM.  - Continue monitoring vitals.  - Frequent neuro checks. - Frequent neurovascular checks.   AKI on CKD stage IIIa:  Jane [name] with baseline Cr of 1.3 presents to the [facility] with an elevated Cr. 3.01. FENa 1.8% - Continue monitoring Cr with BMPs.  - Urine Cr: 92.52. - Urine Na: 77. - LR infusion 125 mL/hr - CK ordered - Renal US ordered - Bladder scan ordered  Hypertension:  Jane [name] with a PMH of HTN on Norvasc 10mg  and Cozaar 100 mg.  - Holding home medications  Acute metabolic encephalopathy complicating chronic dementia:  Jane [name] with baseline Dementia presents to [facility] s/p mechanical fall, will check labs to r/o UTI.  - Urine culture ordered - Urinalysis ordered  Malnutrition/Weight Loss:  Jane [name] with unintentional weight loss. March 2020 152 pounds -> March 2021 108 pounds.  - Consult to dietician  - Bedside swallow evaluation, nutrition and supplements will be ordered if passes evaluation.  - Remeron SOL-TAB 15 mg QD at bedtime  Hyperlipidemia:  - Continue Lipitor 40 mg QD  Chronic HFpEF:  Last echo 03/18/2019 with EF 60-65% EF.  Hold Lasix 20 mg QD due to AKI  Previous COVID 19 Positive Test:  Jane [name] COVID + on 08/03/2019 and was treated with doxycycline, Florastor, aspirin, prednisone, and zinc sulfate. S/P 1 round Moderna vaccine 07/19/19 - COVID test pending, results may be FP considering recent infection. Per CDC guidelines Jane [name] should wait 90 days before  next COVID test.   Dispo: Admit Jane [name] to Inpatient with expected length of stay greater than 2 midnights.  Signed: Maudie Mercury, MD 09/16/2019, 9:40 PM

## 2019-09-16 NOTE — ED Provider Notes (Signed)
[facility] EMERGENCY DEPARTMENT Provider Note   CSN: [id number] Arrival date & time: 09/16/19  1729     History Chief Complaint  Patient presents with  . Fall    Britten Taubert Radermacher is a 86 y.o. female.  The history is provided by the EMS personnel, medical records, the nursing home and the patient. No language interpreter was used.  Fall   Deosha Mammen Critser is a 86 y.o. female who presents to the Emergency Department complaining of fall.  Level V caveat due to AMS. Hx is provided by EMS and nursing home nurse.  She was found on the floor beside her bed, unwittnesed fall.  She had trauma to her face.  No reports of recent illnesses.  Pt without complaints on ED arrival.      Past Medical History:  Diagnosis Date  . Anemia   . Aortic insufficiency    mild  . Arthritis    "arms" (08/09/2015)  . CAD (coronary artery disease)    cath 08/09/2014 95% stenosis in prox to mid RCA s/p DES, 80-90% prox OM2, 50% distal LAD  . CHF (congestive heart failure) ([place])   . CKD (chronic kidney disease) stage 3, GFR 30-59 ml/min   . Colon polyp    2009 colonoscopy, not retrieved for pathology  . Dyspnea   . Ectropion of left lower eyelid   . GERD (gastroesophageal reflux disease) 2009   EGD with benign gastric polyp too  . Hyperlipidemia   . Hypertension   . Pneumonia    history of  . Stroke ([place]) 1975  . Vitamin D deficiency     Patient Active Problem List   Diagnosis Date Noted  . Traumatic subdural hematoma ([place]) 09/16/2019  . History of colon polyps 09/07/2019  . Dry eyes 07/01/2019  . Poor appetite 06/27/2019  . Weight loss 06/27/2019  . Advance care planning 06/17/2019  . Acute renal failure superimposed on stage 3 chronic kidney disease ([place]) 03/29/2019  . Thrombocytopenia ([place]) 03/29/2019  . E coli bacteremia 03/29/2019  . IVH (intraventricular hemorrhage) ([place]) 03/19/2019  . Cytotoxic brain edema ([place]) 03/19/2019  . Pressure injury of skin 03/18/2019  .  Stroke (cerebrum) ([place]) 03/17/2019  . Urge incontinence 11/18/2018  . Acute exacerbation of CHF (congestive heart failure) ([place]) 10/20/2018  . Acute on chronic heart failure with preserved ejection fraction (HFpEF) ([place]) 07/25/2018  . Hypokalemia 07/25/2018  . Cellulitis of left lower extremity   . Toenail deformity 03/19/2018  . Skin lesion of scalp 03/19/2018  . Coronary artery disease involving native coronary artery of native heart with angina pectoris ([place]) 08/25/2017  . Unsteady gait 08/11/2016  . Abdominal aortic aneurysm ([place]) 05/21/2016  . Trochanteric bursitis of left hip   . Pancytopenia ([place]) 12/27/2015  . CKD (chronic kidney disease) 03/31/2015  . Prediabetes 10/16/2014  . Status post insertion of drug-eluting stent into right coronary artery for coronary artery disease 08/07/2014  . SOB (shortness of breath) 05/31/2014  . Physical deconditioning 05/31/2014  . Heart failure with preserved ejection fraction ([place]) 12/27/2012  . Ectropion of left lower eyelid 12/17/2012  . Onychomycosis of toenail 09/18/2012  . PAD (peripheral artery disease) ([place]) 09/18/2012  . Preventative health care 09/18/2012  . Vitamin D deficiency 04/25/2008  . History of cerebrovascular accident 10/29/2007  . Hyperlipidemia 09/24/2006  . Anemia, unspecified 09/24/2006  . Hypertension 09/24/2006  . OSTEOPENIA 09/24/2006  . Vitamin B12 deficiency 08/29/2006    Past Surgical History:  Procedure Laterality Date  .  ABDOMINAL HYSTERECTOMY    . APPENDECTOMY    . ARTERY BIOPSY Left 12/16/2012   Procedure: BIOPSY TEMPORAL ARTERY;  Surgeon: Mal Misty, MD;  Location: [facility];  Service: Vascular;  Laterality: Left;  . CARDIAC CATHETERIZATION    . LEFT HEART CATHETERIZATION WITH CORONARY ANGIOGRAM N/A 08/09/2014   Procedure: LEFT HEART CATHETERIZATION WITH CORONARY ANGIOGRAM;  Surgeon: Peter M Martinique, MD;  Location: [facility] C[facility] LAB;  Service: Cardiovascular;  Laterality: N/A;  . PERCUTANEOUS CORONARY STENT  INTERVENTION (PCI-S)  08/09/2014   Procedure: PERCUTANEOUS CORONARY STENT INTERVENTION (PCI-S);  Surgeon: Peter M Martinique, MD;  Location: [facility] CATH LAB;  Service: Cardiovascular;;  . TONSILLECTOMY       OB History   No obstetric history on file.     Family History  Problem Relation Age of Onset  . Stroke Mother   . Hypertension Mother   . Stroke Father   . Hypertension Father   . Diabetes Sister     Social History   Tobacco Use  . Smoking status: Former Smoker    Types: Cigarettes    Quit date: 07/02/1979    Years since quitting: 40.2  . Smokeless tobacco: Former Systems developer  . Tobacco comment: Started in teenage years - Quit 1980  Substance Use Topics  . Alcohol use: No    Alcohol/week: 0.0 standard drinks    Comment: Quit alcohol 1975-76  . Drug use: No    Home Medications Prior to Admission medications   Medication Sig Start Date End Date Taking? Authorizing Provider  acetaminophen (TYLENOL) 325 MG tablet Take 650 mg by mouth every 6 (six) hours as needed for mild pain or headache.    Yes [provider]  albuterol (VENTOLIN HFA) 108 (90 Base) MCG/ACT inhaler Inhale 1 puff into the lungs every 6 (six) hours as needed for wheezing or shortness of breath.   Yes [provider]  Amino Acids-Protein Hydrolys (FEEDING SUPPLEMENT, PRO-STAT SUGAR FREE 64,) LIQD Place 30 mLs into feeding tube 2 (two) times daily. Patient taking differently: Take 30 mLs by mouth 2 (two) times daily.  03/27/19  Yes Hosie Poisson, MD  amLODipine (NORVASC) 5 MG tablet Take 5 mg by mouth daily.   Yes [provider]  atorvastatin (LIPITOR) 40 MG tablet Take 1 tablet (40 mg total) by mouth daily at 6 PM. Patient taking differently: Take 40 mg by mouth at bedtime.  09/18/18  Yes Lassen, Arlo C, PA-C  bisacodyl (DULCOLAX) 10 MG suppository Place 10 mg rectally daily as needed (for constipation not relieved by Milk of Magnesia).    Yes [provider]  bisoprolol (ZEBETA) 10 MG  tablet Place 1 tablet (10 mg total) into feeding tube 2 (two) times daily. Patient taking differently: Take 10 mg by mouth daily.  03/27/19  Yes Hosie Poisson, MD  Eyelid Cleansers (OCUSOFT LID SCRUB PLUS) PADS Place 1 each into both eyes See admin instructions. CLEANSE BILATERAL EYELASHES ONCE DAILY DX: DRY EYES    Yes [provider]  feeding supplement, ENSURE ENLIVE, (ENSURE ENLIVE) LIQD Take 237 mLs by mouth 2 (two) times daily between meals. 03/27/19  Yes Hosie Poisson, MD  ferrous sulfate 325 (65 FE) MG tablet Take 325 mg by mouth daily with breakfast. FOR 6 WEEKS 09/10/19 10/20/19 Yes [provider]  furosemide (LASIX) 20 MG tablet TAKE 1 TABLET(20 MG) BY MOUTH DAILY Patient taking differently: Take 20 mg by mouth daily.  11/18/18  Yes Asencion Noble, MD  isosorbide mononitrate (IMDUR)  60 MG 24 hr tablet Take 1 tablet (60 mg total) by mouth daily. 03/28/19  Yes Hosie Poisson, MD  losartan (COZAAR) 100 MG tablet TAKE 1 TABLET(100 MG) BY MOUTH DAILY Patient taking differently: Take 100 mg by mouth daily.  11/18/18  Yes Asencion Noble, MD  magnesium hydroxide (MILK OF MAGNESIA) 400 MG/5ML suspension Take 30 mLs by mouth daily as needed for mild constipation.    Yes [provider]  mirtazapine (REMERON SOL-TAB) 15 MG disintegrating tablet Take 15 mg by mouth See admin instructions. Dissolve 15 mg (1 tablet) in the mouth at bedtime nightly at 8 PM   Yes [provider]  NON FORMULARY Take by mouth See admin instructions. Magic Cup: Eat 1 cup once daily to help prevent weight loss   Yes [provider]  Nutritional Supplement LIQD Take 120 mLs by mouth See admin instructions. MedPass; Drink 120 ml's by mouth three times a day   Yes [provider]  pantoprazole sodium (PROTONIX) 40 mg/20 mL PACK Take 20 mLs (40 mg total) by mouth daily. 03/28/19  Yes Hosie Poisson, MD  polyvinyl alcohol (ARTIFICIAL TEARS) 1.4 % ophthalmic solution Place 1 drop  into both eyes 2 (two) times daily. For dry eyes Patient taking differently: Place 1 drop into both eyes 2 (two) times daily.  09/18/18  Yes Lassen, Arlo C, PA-C  senna (SENOKOT) 8.6 MG TABS tablet Take 2 tablets by mouth at bedtime.   Yes [provider]  Sodium Phosphates (RA SALINE ENEMA RE) Place 1 enema rectally daily as needed (for constipation not relieved by Dulcolax suppository and call MD if no relief from enema).    Yes [provider]  amLODipine (NORVASC) 10 MG tablet Take 1 tablet (10 mg total) by mouth daily. Patient not taking: Reported on 09/16/2019 03/28/19   Hosie Poisson, MD    Allergies    Patient has no known allergies.  Review of Systems   Review of Systems  Unable to perform ROS: Dementia    Physical Exam Updated Vital Signs BP 118/74   Pulse 60   Temp (!) 97.1 F (36.2 C) (Tympanic)   Resp 17   Ht 4\' 10"  (1.473 m)   Wt 51 kg   SpO2 98%   BMI 23.50 kg/m   Physical Exam Vitals and nursing note reviewed.  Constitutional:      Appearance: She is well-developed.  HENT:     Head: Normocephalic.     Comments: ecchymosis and swelling to the left periorbital region with small laceration.   Cardiovascular:     Rate and Rhythm: Normal rate and regular rhythm.     Heart sounds: No murmur.  Pulmonary:     Effort: Pulmonary effort is normal. No respiratory distress.     Breath sounds: Normal breath sounds.  Abdominal:     Palpations: Abdomen is soft.     Tenderness: There is no abdominal tenderness. There is no guarding or rebound.  Musculoskeletal:        General: No tenderness.  Skin:    General: Skin is warm and dry.  Neurological:     Mental Status: She is alert.     Comments: Alert.  Oriented to person.  Disoriented to place and time and recent events.  Weakly moves all four extremities.   Psychiatric:     Comments: Unable to assess     ED Results / Procedures / Treatments   Labs (all labs ordered are listed, but only abnormal  results are displayed) Labs Reviewed  COMPREHENSIVE METABOLIC PANEL - Abnormal; Notable for the following components:      Result Value   Glucose, Bld 103 (*)    BUN 62 (*)    Creatinine, Ser 3.01 (*)    Albumin 2.8 (*)    GFR calc non Af Amer 13 (*)    GFR calc Af Amer 15 (*)    All other components within normal limits  CBC WITH DIFFERENTIAL/PLATELET - Abnormal; Notable for the following components:   RBC 2.89 (*)    Hemoglobin 8.9 (*)    HCT 28.6 (*)    All other components within normal limits  SARS CORONAVIRUS 2 (TAT 6-24 HRS)  URINE CULTURE  PROTIME-INR  URINALYSIS, ROUTINE W REFLEX MICROSCOPIC  SODIUM, URINE, RANDOM  CREATININE, URINE, RANDOM  BASIC METABOLIC PANEL  CBC    EKG None  Radiology CT Head Wo Contrast  Result Date: 09/16/2019 CLINICAL DATA:  Head injury.  Laceration to left eyebrow. EXAM: CT HEAD WITHOUT CONTRAST CT CERVICAL SPINE WITHOUT CONTRAST TECHNIQUE: Multidetector CT imaging of the head and cervical spine was performed following the standard protocol without intravenous contrast. Multiplanar CT image reconstructions of the cervical spine were also generated. COMPARISON:  CT head dated 03/21/2019. FINDINGS: CT HEAD FINDINGS Brain: There is mixed acute and chronic extra-axial hemorrhage along the right frontoparietal convexity. This collection measures up to approximately 6 mm in thickness. There is a curvilinear hyperdense area along the margins of the left lateral ventricle (axial series 3, image 20). This may represent a small volume of acute intraventricular hemorrhage. Atrophy and chronic microvascular ischemic changes are noted. There are old right basal ganglia lacunar infarcts. There is no significant midline shift. No mass effect. Vascular: No hyperdense vessel or unexpected calcification. Skull: There is left frontal scalp swelling without evidence for an underlying fracture. Sinuses/Orbits: No acute finding. Other: None. CT CERVICAL SPINE FINDINGS  Alignment: There is straightening and slight reversal of the normal cervical lordotic curvature. Skull base and vertebrae: No acute fracture. No primary bone lesion or focal pathologic process. Soft tissues and spinal canal: No prevertebral fluid or swelling. No visible canal hematoma. Disc levels: Multilevel disc height loss is noted throughout the cervical spine, which is moderate to severe. Multilevel osseous neural foraminal stenosis is noted. Upper chest: Negative. Other: None IMPRESSION: 1. Mixed acute on chronic right-sided subdural hematoma as detailed above without evidence for significant midline shift or mass effect. 2. Findings concerning for small volume acute intraventricular hemorrhage within the left lateral ventricle. 3. Advanced atrophy and chronic microvascular ischemic changes are again noted. 4. No acute cervical spine fracture. 5. Left frontal scalp swelling without evidence for an underlying fracture. These results were called by telephone at the time of interpretation on 09/16/2019 at 7:55 pm to provider [facility] , who verbally acknowledged these results. Electronically Signed   By: Constance Holster M.D.   On: 09/16/2019 19:58   CT Cervical Spine Wo Contrast  Result Date: 09/16/2019 CLINICAL DATA:  Head injury.  Laceration to left eyebrow. EXAM: CT HEAD WITHOUT CONTRAST CT CERVICAL SPINE WITHOUT CONTRAST TECHNIQUE: Multidetector CT imaging of the head and cervical spine was performed following the standard protocol without intravenous contrast. Multiplanar CT image reconstructions of the cervical spine were also generated. COMPARISON:  CT head dated 03/21/2019. FINDINGS: CT HEAD FINDINGS Brain: There is mixed acute and chronic extra-axial hemorrhage along the right frontoparietal convexity. This collection measures up to approximately 6 mm in thickness. There is  a curvilinear hyperdense area along the margins of the left lateral ventricle (axial series 3, image 20). This may  represent a small volume of acute intraventricular hemorrhage. Atrophy and chronic microvascular ischemic changes are noted. There are old right basal ganglia lacunar infarcts. There is no significant midline shift. No mass effect. Vascular: No hyperdense vessel or unexpected calcification. Skull: There is left frontal scalp swelling without evidence for an underlying fracture. Sinuses/Orbits: No acute finding. Other: None. CT CERVICAL SPINE FINDINGS Alignment: There is straightening and slight reversal of the normal cervical lordotic curvature. Skull base and vertebrae: No acute fracture. No primary bone lesion or focal pathologic process. Soft tissues and spinal canal: No prevertebral fluid or swelling. No visible canal hematoma. Disc levels: Multilevel disc height loss is noted throughout the cervical spine, which is moderate to severe. Multilevel osseous neural foraminal stenosis is noted. Upper chest: Negative. Other: None IMPRESSION: 1. Mixed acute on chronic right-sided subdural hematoma as detailed above without evidence for significant midline shift or mass effect. 2. Findings concerning for small volume acute intraventricular hemorrhage within the left lateral ventricle. 3. Advanced atrophy and chronic microvascular ischemic changes are again noted. 4. No acute cervical spine fracture. 5. Left frontal scalp swelling without evidence for an underlying fracture. These results were called by telephone at the time of interpretation on 09/16/2019 at 7:55 pm to provider [facility] , who verbally acknowledged these results. Electronically Signed   By: Constance Holster M.D.   On: 09/16/2019 19:58    Procedures Procedures (including critical care time) CRITICAL CARE Performed by: Quintella Reichert   Total critical care time: 35 minutes  Critical care time was exclusive of separately billable procedures and treating other patients.  Critical care was necessary to treat or prevent imminent or  life-threatening deterioration.  Critical care was time spent personally by me on the following activities: development of treatment plan with patient and/or surrogate as well as nursing, discussions with consultants, evaluation of patient's response to treatment, examination of patient, obtaining history from patient or surrogate, ordering and performing treatments and interventions, ordering and review of laboratory studies, ordering and review of radiographic studies, pulse oximetry and re-evaluation of patient's condition.  Medications Ordered in ED Medications  senna-docusate (Senokot-S) tablet 1 tablet (has no administration in time range)  promethazine (PHENERGAN) tablet 12.5 mg (has no administration in time range)  albuterol (VENTOLIN HFA) 108 (90 Base) MCG/ACT inhaler 1 puff (has no administration in time range)  feeding supplement (PRO-STAT SUGAR FREE 64) liquid 30 mL (has no administration in time range)  atorvastatin (LIPITOR) tablet 40 mg (has no administration in time range)  bisacodyl (DULCOLAX) suppository 10 mg (has no administration in time range)  OcuSoft Lid Scrub Plus PADS 1 each (has no administration in time range)  furosemide (LASIX) tablet 20 mg (has no administration in time range)  ferrous sulfate tablet 325 mg (has no administration in time range)  mirtazapine (REMERON SOL-TAB) disintegrating tablet 15 mg (has no administration in time range)  pantoprazole sodium (PROTONIX) 40 mg/20 mL oral suspension 40 mg (has no administration in time range)  polyvinyl alcohol (LIQUIFILM TEARS) 1.4 % ophthalmic solution 1 drop (has no administration in time range)  senna (SENOKOT) tablet 17.2 mg (has no administration in time range)  lidocaine-EPINEPHrine (XYLOCAINE W/EPI) 2 %-1:200000 (PF) injection 20 mL (20 mLs Infiltration Given 09/16/19 2058)    ED Course  I have reviewed the triage vital signs and the nursing notes.  Pertinent labs & imaging results  that were available  during my care of the patient were reviewed by me and considered in my medical decision making (see chart for details).    MDM Rules/Calculators/A&P                     Patient here for evaluation of injuries following an unwitnessed fall. She does have a facial laceration, which was repaired per PA note. Imaging significant for acute on chronic subdural hematoma. Patient appears to be at her neurologic baseline per facility reports. Discussed with neurosurgeon PA on-call, who will evaluate the patient in the emergency department. Labs are significant for AKI, no reports of illnesses or issues per nursing facility. Attempted to contact patient's family for additional information but there was no answer. Medicine consulted for admission for further treatment.  Final Clinical Impression(s) / ED Diagnoses Final diagnoses:  SDH (subdural hematoma) ([place])  Fall, initial encounter  Facial laceration, initial encounter  AKI (acute kidney injury) [facility])    Rx / [place] Orders ED Discharge Orders    None       Quintella Reichert, MD 09/17/19 0010

## 2019-09-16 NOTE — ED Notes (Signed)
Admitting physician in room evaluating patient.

## 2019-09-16 NOTE — ED Provider Notes (Signed)
Laceration repair to L eyebrow region per request by Dr. Ralene Bathe.   LACERATION REPAIR Performed by: Domenic Moras Authorized by: Domenic Moras Consent: Verbal consent obtained. Risks and benefits: risks, benefits and alternatives were discussed Consent given by: patient Patient identity confirmed: provided demographic data Prepped and Draped in normal sterile fashion Wound explored  Laceration Location: L eyebrow  Laceration Length: 2cm  No Foreign Bodies seen or palpated  Anesthesia: local infiltration  Local anesthetic: lidocaine 2% w epinephrine  Anesthetic total: 1 ml  Irrigation method: syringe Amount of cleaning: standard  Skin closure: chromic gut, 5-0  Number of sutures: 3  Technique: simple interrupted  Patient tolerance: Patient tolerated the procedure well with no immediate complications.    Domenic Moras, PA-C 09/16/19 2126    Quintella Reichert, MD 09/17/19 [phone]

## 2019-09-16 NOTE — ED Triage Notes (Signed)
Patient was reaching for something and fell out of bed.  Approximately 1 ft fall.  Hit head, laceration left eyebrow.  Also reports knee pain. Patient at baseline per facility staff and EMS

## 2019-09-16 NOTE — Progress Notes (Addendum)
FYI Patient tested positive for covid on 08/03/19. Per CDC guidelines the patients covid test may continue to be positive for 90 day subsequently. Therefore, the patient's covid test that was already ordered is likely to return postive. The patient also received her first dose of moderna vaccine on 07/19/19.  Spoke to bed placement and notified them about this information.   -please admit to a non covid floor  Lars Mage, MD Internal Medicine PGY3 09/16/2019, 10:31 PM

## 2019-09-17 ENCOUNTER — Inpatient Hospital Stay (HOSPITAL_COMMUNITY): Payer: Medicare Other

## 2019-09-17 ENCOUNTER — Other Ambulatory Visit (HOSPITAL_COMMUNITY): Payer: Medicare Other

## 2019-09-17 ENCOUNTER — Other Ambulatory Visit: Payer: Self-pay

## 2019-09-17 DIAGNOSIS — G9341 Metabolic encephalopathy: Secondary | ICD-10-CM

## 2019-09-17 DIAGNOSIS — F0390 Unspecified dementia without behavioral disturbance: Secondary | ICD-10-CM

## 2019-09-17 DIAGNOSIS — Z9181 History of falling: Secondary | ICD-10-CM

## 2019-09-17 DIAGNOSIS — W19XXXA Unspecified fall, initial encounter: Secondary | ICD-10-CM

## 2019-09-17 DIAGNOSIS — Z8616 Personal history of COVID-19: Secondary | ICD-10-CM

## 2019-09-17 DIAGNOSIS — I5032 Chronic diastolic (congestive) heart failure: Secondary | ICD-10-CM

## 2019-09-17 DIAGNOSIS — S01112A Laceration without foreign body of left eyelid and periocular area, initial encounter: Secondary | ICD-10-CM

## 2019-09-17 DIAGNOSIS — I719 Aortic aneurysm of unspecified site, without rupture: Secondary | ICD-10-CM

## 2019-09-17 DIAGNOSIS — E43 Unspecified severe protein-calorie malnutrition: Secondary | ICD-10-CM | POA: Insufficient documentation

## 2019-09-17 DIAGNOSIS — Z79899 Other long term (current) drug therapy: Secondary | ICD-10-CM

## 2019-09-17 DIAGNOSIS — E785 Hyperlipidemia, unspecified: Secondary | ICD-10-CM

## 2019-09-17 DIAGNOSIS — E46 Unspecified protein-calorie malnutrition: Secondary | ICD-10-CM

## 2019-09-17 DIAGNOSIS — Y92129 Unspecified place in nursing home as the place of occurrence of the external cause: Secondary | ICD-10-CM

## 2019-09-17 DIAGNOSIS — Z66 Do not resuscitate: Secondary | ICD-10-CM

## 2019-09-17 DIAGNOSIS — Z8673 Personal history of transient ischemic attack (TIA), and cerebral infarction without residual deficits: Secondary | ICD-10-CM

## 2019-09-17 DIAGNOSIS — Z87891 Personal history of nicotine dependence: Secondary | ICD-10-CM

## 2019-09-17 DIAGNOSIS — S065X9A Traumatic subdural hemorrhage with loss of consciousness of unspecified duration, initial encounter: Principal | ICD-10-CM

## 2019-09-17 DIAGNOSIS — N3000 Acute cystitis without hematuria: Secondary | ICD-10-CM

## 2019-09-17 DIAGNOSIS — D649 Anemia, unspecified: Secondary | ICD-10-CM

## 2019-09-17 DIAGNOSIS — N1831 Chronic kidney disease, stage 3a: Secondary | ICD-10-CM

## 2019-09-17 DIAGNOSIS — N179 Acute kidney failure, unspecified: Secondary | ICD-10-CM

## 2019-09-17 DIAGNOSIS — I130 Hypertensive heart and chronic kidney disease with heart failure and stage 1 through stage 4 chronic kidney disease, or unspecified chronic kidney disease: Secondary | ICD-10-CM

## 2019-09-17 LAB — URINALYSIS, ROUTINE W REFLEX MICROSCOPIC
Bilirubin Urine: NEGATIVE
Glucose, UA: NEGATIVE mg/dL
Ketones, ur: NEGATIVE mg/dL
Nitrite: NEGATIVE
Protein, ur: 100 mg/dL — AB
Specific Gravity, Urine: 1.011 (ref 1.005–1.030)
WBC, UA: >50 WBC/hpf — ABNORMAL HIGH (ref 0–5)
pH: 6 (ref 5.0–8.0)

## 2019-09-17 LAB — CBC
HCT: 30.2 % — ABNORMAL LOW (ref 36.0–46.0)
Hemoglobin: 9.4 g/dL — ABNORMAL LOW (ref 12.0–15.0)
MCH: 30.3 pg (ref 26.0–34.0)
MCHC: 31.1 g/dL (ref 30.0–36.0)
MCV: 97.4 fL (ref 80.0–100.0)
Platelets: 169 10*3/uL (ref 150–400)
RBC: 3.1 MIL/uL — ABNORMAL LOW (ref 3.87–5.11)
RDW: 14.2 % (ref 11.5–15.5)
WBC: 7.2 10*3/uL (ref 4.0–10.5)
nRBC: 0 % (ref 0.0–0.2)

## 2019-09-17 LAB — BASIC METABOLIC PANEL
Anion gap: 15 (ref 5–15)
BUN: 63 mg/dL — ABNORMAL HIGH (ref 8–23)
CO2: 24 mmol/L (ref 22–32)
Calcium: 9.1 mg/dL (ref 8.9–10.3)
Chloride: 104 mmol/L (ref 98–111)
Creatinine, Ser: 2.73 mg/dL — ABNORMAL HIGH (ref 0.44–1.00)
GFR calc Af Amer: 17 mL/min — ABNORMAL LOW (ref >60)
GFR calc non Af Amer: 15 mL/min — ABNORMAL LOW (ref >60)
Glucose, Bld: 113 mg/dL — ABNORMAL HIGH (ref 70–99)
Potassium: 3.9 mmol/L (ref 3.5–5.1)
Sodium: 143 mmol/L (ref 135–145)

## 2019-09-17 LAB — SARS CORONAVIRUS 2 (TAT 6-24 HRS): SARS Coronavirus 2: POSITIVE — AB

## 2019-09-17 LAB — CBG MONITORING, ED: Glucose-Capillary: 100 mg/dL — ABNORMAL HIGH (ref 70–99)

## 2019-09-17 LAB — SODIUM, URINE, RANDOM: Sodium, Ur: 77 mmol/L

## 2019-09-17 LAB — CK: Total CK: 80 U/L (ref 38–234)

## 2019-09-17 LAB — CREATININE, URINE, RANDOM: Creatinine, Urine: 92.52 mg/dL

## 2019-09-17 LAB — MRSA PCR SCREENING: MRSA by PCR: NEGATIVE

## 2019-09-17 MED ORDER — FUROSEMIDE 20 MG PO TABS: 20.0000 mg | ORAL_TABLET | Freq: Every day | ORAL | Status: DC

## 2019-09-17 MED ORDER — ENSURE ENLIVE PO LIQD
237.0000 mL | Freq: Two times a day (BID) | ORAL | Status: DC
  Administered 2019-09-19: 237 mL via ORAL

## 2019-09-17 MED ORDER — LACTATED RINGERS IV SOLN: INTRAVENOUS | Status: AC

## 2019-09-17 MED ORDER — CEPHALEXIN 500 MG PO CAPS
500.0000 mg | ORAL_CAPSULE | Freq: Two times a day (BID) | ORAL | Status: DC
  Administered 2019-09-17 – 2019-09-18 (×2): 500 mg via ORAL
  Filled 2019-09-17 (×2): qty 1

## 2019-09-17 MED ORDER — LACTATED RINGERS IV SOLN: INTRAVENOUS | Status: DC

## 2019-09-17 MED ORDER — CEPHALEXIN 500 MG PO CAPS: 500.0000 mg | ORAL_CAPSULE | Freq: Four times a day (QID) | ORAL | Status: AC

## 2019-09-17 MED ORDER — SODIUM CHLORIDE 0.9 % IV SOLN
1.0000 g | INTRAVENOUS | Status: DC
  Administered 2019-09-17: 1 g via INTRAVENOUS
  Filled 2019-09-17: qty 10

## 2019-09-17 NOTE — ED Notes (Signed)
PT CONFUSED   hD PULLED OFF ALL LEADS AND PULSE OX , pt6 is very confused bed, gown and pt very wet,  Pt cleaned and bed changed , pur wick placed ,  Pt picking at IV site , rt arm , site re secured

## 2019-09-17 NOTE — ED Notes (Addendum)
Pt not able to cooperate with [place]. Pt combative and confused. Pt trying to get out of bed. Admitting MD notified. No new orders

## 2019-09-17 NOTE — ED Notes (Signed)
Care endorsed to [facility], [place]

## 2019-09-17 NOTE — TOC Initial Note (Signed)
Transition of Care [facility]) - Initial/Assessment Note    Patient Details  Name: Jane [name] MRN: [id number] Date of Birth: 1928-07-25  Transition of Care [facility]) CM/SW Contact:    Geralynn Ochs, LCSW Phone Number: 09/17/2019, 4:43 PM  Clinical Narrative:    Patient long term care at [facility], and has a bed to return. SNF will need discharge summary tonight to verify medications for a weekend discharge, MD aware. CSW to follow.               Expected Discharge Plan: Skilled Nursing Facility Barriers to Discharge: Continued Medical Work up   Patient Goals and CMS Choice Patient states their goals for this hospitalization and ongoing recovery are:: patient unable to participate in goal setting due to confusion      Expected Discharge Plan and Services Expected Discharge Plan: [place] Choice: Central Living arrangements for the past 2 months: [place]                                      Prior Living Arrangements/Services Living arrangements for the past 2 months: [place] Lives with:: Facility Resident Patient language and need for interpreter reviewed:: No        Need for Family Participation in Patient Care: Yes (Comment) Care giver support system in place?: Yes (comment)   Criminal Activity/Legal Involvement Pertinent to Current Situation/Hospitalization: No - Comment as needed  Activities of Daily Living      Permission Sought/Granted Permission sought to share information with : Facility Sport and exercise psychologist, Family Supports Permission granted to share information with : Yes, Verbal Permission Granted  Share Information with NAME: Kayren Eaves  Permission granted to share info w AGENCY: [facility]  Permission granted to share info w Relationship: Nephews     Emotional Assessment   Attitude/Demeanor/Rapport: Unable to Assess Affect (typically observed): Unable  to Assess        Admission diagnosis:  SDH (subdural hematoma) (HCC) [S06.5X9A] Traumatic subdural hematoma ([place]) [S06.5X9A] AKI (acute kidney injury) ([place]) [N17.9] Facial laceration, initial encounter [S01.81XA] Fall, initial encounter [W19.XXXA] Patient Active Problem List   Diagnosis Date Noted  . Protein-calorie malnutrition, severe 09/17/2019  . Traumatic subdural hematoma ([place]) 09/16/2019  . History of colon polyps 09/07/2019  . Dry eyes 07/01/2019  . Poor appetite 06/27/2019  . Weight loss 06/27/2019  . Advance care planning 06/17/2019  . Acute renal failure superimposed on stage 3 chronic kidney disease ([place]) 03/29/2019  . Thrombocytopenia ([place]) 03/29/2019  . E coli bacteremia 03/29/2019  . IVH (intraventricular hemorrhage) ([place]) 03/19/2019  . Cytotoxic brain edema ([place]) 03/19/2019  . Pressure injury of skin 03/18/2019  . Stroke (cerebrum) ([place]) 03/17/2019  . Urge incontinence 11/18/2018  . Acute exacerbation of CHF (congestive heart failure) ([place]) 10/20/2018  . Acute on chronic heart failure with preserved ejection fraction (HFpEF) ([place]) 07/25/2018  . Hypokalemia 07/25/2018  . Cellulitis of left lower extremity   . Toenail deformity 03/19/2018  . Skin lesion of scalp 03/19/2018  . Coronary artery disease involving native coronary artery of native heart with angina pectoris ([place]) 08/25/2017  . Unsteady gait 08/11/2016  . Abdominal aortic aneurysm ([place]) 05/21/2016  . Trochanteric bursitis of left hip   . Pancytopenia ([place]) 12/27/2015  . CKD (chronic kidney disease) 03/31/2015  . Prediabetes 10/16/2014  . Status post insertion of drug-eluting  stent into right coronary artery for coronary artery disease 08/07/2014  . SOB (shortness of breath) 05/31/2014  . Physical deconditioning 05/31/2014  . Heart failure with preserved ejection fraction ([place]) 12/27/2012  . Ectropion of left lower eyelid 12/17/2012  . Onychomycosis of toenail 09/18/2012  . PAD (peripheral artery  disease) ([place]) 09/18/2012  . Preventative health care 09/18/2012  . Vitamin D deficiency 04/25/2008  . History of cerebrovascular accident 10/29/2007  . Hyperlipidemia 09/24/2006  . Anemia, unspecified 09/24/2006  . Hypertension 09/24/2006  . OSTEOPENIA 09/24/2006  . Vitamin B12 deficiency 08/29/2006   PCP:  Ina Homes, MD Pharmacy:   [facility] [place] [address] Phone: [phone] Fax: [phone]     Social Determinants of Health (SDOH) Interventions    Readmission Risk Interventions No flowsheet data found.

## 2019-09-17 NOTE — Progress Notes (Signed)
Ok to change Keflex dose to 500mg  PO BID due to her low CrCl per Dr. Darrick Meigs.  Onnie Boer, PharmD, BCIDP, AAHIVP, CPP Infectious Disease Pharmacist 09/17/2019 4:45 PM

## 2019-09-17 NOTE — Discharge Summary (Addendum)
Name: Jane [name] MRN: [id number] DOB: 1928-09-29 86 y.o. PCP: Ina Homes, MD  Date of Admission: 09/16/2019  5:29 PM Date of Discharge: 08/2019 Attending Physician: Lucious Groves, DO  Discharge Diagnosis: 1. Acute on chronic right subdural hematoma with left frontotemporal skin laceration secondary to fall 2. UTI 3. Acute Encephalopathy  Discharge Medications: Allergies as of 09/17/2019   No Known Allergies     Medication List    STOP taking these medications   amLODipine 10 MG tablet Commonly known as: NORVASC   amLODipine 5 MG tablet Commonly known as: NORVASC   isosorbide mononitrate 60 MG 24 hr tablet Commonly known as: IMDUR   losartan 100 MG tablet Commonly known as: COZAAR     TAKE these medications   acetaminophen 325 MG tablet Commonly known as: TYLENOL Take 650 mg by mouth every 6 (six) hours as needed for mild pain or headache.   albuterol 108 (90 Base) MCG/ACT inhaler Commonly known as: VENTOLIN HFA Inhale 1 puff into the lungs every 6 (six) hours as needed for wheezing or shortness of breath.   atorvastatin 40 MG tablet Commonly known as: LIPITOR Take 1 tablet (40 mg total) by mouth daily at 6 PM. What changed: when to take this   bisacodyl 10 MG suppository Commonly known as: DULCOLAX Place 10 mg rectally daily as needed (for constipation not relieved by Milk of Magnesia).   bisoprolol 10 MG tablet Commonly known as: ZEBETA Place 1 tablet (10 mg total) into feeding tube 2 (two) times daily. What changed:   how to take this  when to take this   cephALEXin 500 MG capsule Commonly known as: KEFLEX Take 1 capsule (500 mg total) by mouth 4 (four) times daily for 4 days.   Nutritional Supplement Liqd Take 120 mLs by mouth See admin instructions. MedPass; Drink 120 ml's by mouth three times a day   feeding supplement (ENSURE ENLIVE) Liqd Take 237 mLs by mouth 2 (two) times daily between meals.   feeding supplement (PRO-STAT  SUGAR FREE 64) Liqd Place 30 mLs into feeding tube 2 (two) times daily. What changed: how to take this   ferrous sulfate 325 (65 FE) MG tablet Take 325 mg by mouth daily with breakfast. FOR 6 WEEKS   furosemide 20 MG tablet Commonly known as: LASIX TAKE 1 TABLET(20 MG) BY MOUTH DAILY What changed: See the new instructions.   magnesium hydroxide 400 MG/5ML suspension Commonly known as: MILK OF MAGNESIA Take 30 mLs by mouth daily as needed for mild constipation.   mirtazapine 15 MG disintegrating tablet Commonly known as: REMERON SOL-TAB Take 15 mg by mouth See admin instructions. Dissolve 15 mg (1 tablet) in the mouth at bedtime nightly at 8 PM   NON FORMULARY Take by mouth See admin instructions. Magic Cup: Eat 1 cup once daily to help prevent weight loss   OcuSoft Lid Scrub Plus Pads Place 1 each into both eyes See admin instructions. CLEANSE BILATERAL EYELASHES ONCE DAILY DX: DRY EYES   pantoprazole sodium 40 mg/20 mL Pack Commonly known as: PROTONIX Take 20 mLs (40 mg total) by mouth daily.   polyvinyl alcohol 1.4 % ophthalmic solution Commonly known as: Artificial Tears Place 1 drop into both eyes 2 (two) times daily. For dry eyes What changed: additional instructions   RA SALINE ENEMA RE Place 1 enema rectally daily as needed (for constipation not relieved by Dulcolax suppository and call MD if no relief from enema).   senna 8.6 MG  Tabs tablet Commonly known as: SENOKOT Take 2 tablets by mouth at bedtime.       Disposition and follow-up:   Jane [name] [facility] in Stable condition.  At the hospital follow up visit please address:  1.  Acute on chronic right subdural hematoma with left frontotemporal skin laceration secondary to fall. No neurosurgical follow up required. See encephalopathy below. If this recurs, consider repeat CT.   2. Cystitis. UA consistent with UTI. Cultures pending at discharge. Will need keflix  qid for three more days starting 3/21.  3. Acute encephalopathy: Pt developed AMS >baseline. Third CT was done which showed slight interval increase in subdural collection w/no change in midline shift. Per neurosurgery this would not cause her symptoms. Remeron was stopped and patient's mental status returned to baseline the following day. Remeron stopped at discharge.   3. Hypertension. Antihypertensives held on admission. Systolic blood pressures remained in the 110s-130s over her hospitalization so I continued to hold these on discharge, except for bisoprolol. Please assess appropriateness for restarting.  Pending labs/ test needing follow-up: urine culture  Follow-up Appointments: Contact information for after-discharge care    Destination    [facility] .   Service: Skilled Nursing Contact information: [phone] Hospital Course: Jane [name] is an 86 yo female who [facility] to [facility] ED after a fall at her skilled living facility. Head imaging obtained in the ED revealed an acute on chronic subdural hematoma. Repeat CT the following morning was stable. The following morning patient was more somnolent. Repeat CT showed slight interval increase in subdural collection with no midline shift. Discussed with neurosurgery, this was thought not to be enough to cause change in mental status. EEG was done which showed no seizure activity. VBG was normal. Her remeron was held with resolution of symptoms back to her baseline dementia. Remeron was stopped at discharge. If recurrence of AMS consider repeat head CT.   AKI was also present on admission labs. Suspect this is multifactorial--UTI, pre-renal likely from poor po intake--possibly a consequence of progression of her dementia. Creatinine improved with IVF over her hospital stay.  UA obtained on admission consistent with UTI. AMS barring inquisition  regarding sx. Rocephin started 3/19 and transitioned to keflex to complete a 5d course.   Discharge Vitals:   BP (!) 120/55 (BP Location: Left Arm)   Pulse (!) 108   Temp 98 F (36.7 C) (Axillary)   Resp 16   Ht 4\' 10"  (1.473 m)   Wt 51 kg   SpO2 100%   BMI 23.50 kg/m   Pertinent Labs, Studies, and Procedures:  3/18 CT head> mixed acute on chronic right sided subdural hematoma without evidence of significant midline shift or mass effect; findings concerning for small volume acute intraventricular hemorrhage within the left lateral ventricle 3/19 CT head> bleeding becoming less apparent. No evidence of new bleeding. Minimal mass effect with 1-57mm of right to left shift.   SignedMitzi Hansen, MD 09/17/2019, 4:42 PM   Pager: [phone]

## 2019-09-17 NOTE — ED Notes (Signed)
Pt remains resting on cart in NAD.  Pt still restless.  Breathing easy, non-labored. Equal rise and fall of chest noted. Will continue to monitor. Frequent checks made for pt safety

## 2019-09-17 NOTE — ED Notes (Signed)
Pt incontinent of urine. Pt turned, cleaned and repositioned. New linens provided

## 2019-09-17 NOTE — Progress Notes (Signed)
Initial Nutrition Assessment  DOCUMENTATION CODES:   Severe malnutrition in context of chronic illness  INTERVENTION:   Encourage PO intake  30 ml Prostat BID  Ensure Enlive po BID, each supplement provides 350 kcal and 20 grams of protein   NUTRITION DIAGNOSIS:   Severe Malnutrition related to chronic illness(CAD, CKD, dementia) as evidenced by severe fat depletion, severe muscle depletion, moderate muscle depletion, moderate fat depletion.  GOAL:   Patient will meet greater than or equal to 90% of their needs  MONITOR:   PO intake, Supplement acceptance  REASON FOR ASSESSMENT:   Consult Assessment of nutrition requirement/status  ASSESSMENT:   Pt with PMH of anemia, OA, CAD, CHF, CKD stage III, HLD, HTN, AAA, and dementia admitted from SNF after falling out of bed with acute on chronic R SDH.   Pt confused and unable to provide any history.  Pt unable to state any preferences, supplements ordered will adjust based on patient preferences.   Medications reviewed and include: ferrous sulfate, remeron, senna Labs reviewed: BUN/Cr: 63/2.73 (H)    NUTRITION - FOCUSED PHYSICAL EXAM: Completed by unit RD, Larkin Ina    Most Recent Value  Orbital Region  Moderate depletion  Upper Arm Region  Severe depletion  Thoracic and Lumbar Region  Severe depletion  Buccal Region  Moderate depletion  Temple Region  Moderate depletion  Clavicle Bone Region  Severe depletion  Clavicle and Acromion Bone Region  Severe depletion  Scapular Bone Region  Severe depletion  Dorsal Hand  Moderate depletion  Patellar Region  Severe depletion  Anterior Thigh Region  Severe depletion  Posterior Calf Region  Severe depletion  Edema (RD Assessment)  None  Hair  Reviewed  Eyes  Reviewed  Mouth  Reviewed  Skin  Reviewed  Nails  Reviewed       Diet Order:   Diet Order            Diet regular Room service appropriate? Yes; Fluid consistency: Thin  Diet effective now              EDUCATION NEEDS:   No education needs have been identified at this time  Skin:  Skin Assessment: Reviewed RN Assessment  Last BM:  unknown  Height:   Ht Readings from Last 1 Encounters:  09/16/19 4\' 10"  (1.473 m)    Weight:   Wt Readings from Last 1 Encounters:  09/16/19 51 kg    Ideal Body Weight:  43.9 kg  BMI:  Body mass index is 23.5 kg/m.  Estimated Nutritional Needs:   Kcal:  1300-1500  Protein:  70-80 grams  Fluid:  > 1.5 L/day  Lockie Pares., RD, LDN, CNSC See AMiON for contact information

## 2019-09-17 NOTE — NC FL2 (Signed)
[place] LEVEL OF CARE SCREENING TOOL     IDENTIFICATION  Patient Name: Jane [name] Birthdate: 07/22/1928 Sex: female Admission Date (Current Location): 09/16/2019  [facility] and [place] Number:  Herbalist and Address:  [facility], [place] [address], [place], [place] [address]      Provider Number: [id number]  Attending Physician Name and Address:  Lucious Groves, DO  Relative Name and Phone Number:       Current Level of Care: Hospital Recommended Level of Care: [place] Prior Approval Number:    Date Approved/Denied:   PASRR Number:    Discharge Plan: SNF    Current Diagnoses: Patient Active Problem List   Diagnosis Date Noted  . Protein-calorie malnutrition, severe 09/17/2019  . Traumatic subdural hematoma ([place]) 09/16/2019  . History of colon polyps 09/07/2019  . Dry eyes 07/01/2019  . Poor appetite 06/27/2019  . Weight loss 06/27/2019  . Advance care planning 06/17/2019  . Acute renal failure superimposed on stage 3 chronic kidney disease ([place]) 03/29/2019  . Thrombocytopenia ([place]) 03/29/2019  . E coli bacteremia 03/29/2019  . IVH (intraventricular hemorrhage) ([place]) 03/19/2019  . Cytotoxic brain edema ([place]) 03/19/2019  . Pressure injury of skin 03/18/2019  . Stroke (cerebrum) ([place]) 03/17/2019  . Urge incontinence 11/18/2018  . Acute exacerbation of CHF (congestive heart failure) ([place]) 10/20/2018  . Acute on chronic heart failure with preserved ejection fraction (HFpEF) ([place]) 07/25/2018  . Hypokalemia 07/25/2018  . Cellulitis of left lower extremity   . Toenail deformity 03/19/2018  . Skin lesion of scalp 03/19/2018  . Coronary artery disease involving native coronary artery of native heart with angina pectoris ([place]) 08/25/2017  . Unsteady gait 08/11/2016  . Abdominal aortic aneurysm ([place]) 05/21/2016  . Trochanteric bursitis of left hip   . Pancytopenia ([place]) 12/27/2015  . CKD (chronic  kidney disease) 03/31/2015  . Prediabetes 10/16/2014  . Status post insertion of drug-eluting stent into right coronary artery for coronary artery disease 08/07/2014  . SOB (shortness of breath) 05/31/2014  . Physical deconditioning 05/31/2014  . Heart failure with preserved ejection fraction ([place]) 12/27/2012  . Ectropion of left lower eyelid 12/17/2012  . Onychomycosis of toenail 09/18/2012  . PAD (peripheral artery disease) ([place]) 09/18/2012  . Preventative health care 09/18/2012  . Vitamin D deficiency 04/25/2008  . History of cerebrovascular accident 10/29/2007  . Hyperlipidemia 09/24/2006  . Anemia, unspecified 09/24/2006  . Hypertension 09/24/2006  . OSTEOPENIA 09/24/2006  . Vitamin B12 deficiency 08/29/2006    Orientation RESPIRATION BLADDER Height & Weight     Self  Normal Incontinent Weight: 112 lb 7 oz (51 kg) Height:  4\' 10"  (147.3 cm)  BEHAVIORAL SYMPTOMS/MOOD NEUROLOGICAL BOWEL NUTRITION STATUS      Incontinent Diet(see DC summary)  AMBULATORY STATUS COMMUNICATION OF NEEDS Skin   Extensive Assist Verbally Normal                       Personal Care Assistance Level of Assistance  Bathing, Feeding, Dressing Bathing Assistance: Maximum assistance Feeding assistance: Maximum assistance Dressing Assistance: Maximum assistance     Functional Limitations Info             SPECIAL CARE FACTORS FREQUENCY                       Contractures Contractures Info: Not present    Additional Factors Info  Code Status, Allergies, Psychotropic Code Status  Info: DNR Allergies Info: NKA Psychotropic Info: Remeron 15mg  daily at bed         Current Medications (09/17/2019):  This is the current hospital active medication list Current Facility-Administered Medications  Medication Dose Route Frequency Provider Last Rate Last Admin  . albuterol (PROVENTIL) (2.5 MG/3ML) 0.083% nebulizer solution 3 mL  3 mL Inhalation Q6H PRN Chundi, Vahini, MD      .  atorvastatin (LIPITOR) tablet 40 mg  40 mg Oral q1800 Chundi, Vahini, MD      . bisacodyl (DULCOLAX) suppository 10 mg  10 mg Rectal Daily PRN Chundi, Vahini, MD      . cephALEXin (KEFLEX) capsule 500 mg  500 mg Oral QID Christian, Rylee, MD      . Derrill Memo ON 09/18/2019] feeding supplement (ENSURE ENLIVE) (ENSURE ENLIVE) liquid 237 mL  237 mL Oral BID BM Hoffman, Erik C, DO      . feeding supplement (PRO-STAT SUGAR FREE 64) liquid 30 mL  30 mL Per Tube BID Chundi, Vahini, MD   30 mL at 09/17/19 1009  . ferrous sulfate tablet 325 mg  325 mg Oral Q breakfast Chundi, Vahini, MD   325 mg at 09/17/19 0802  . mirtazapine (REMERON SOL-TAB) disintegrating tablet 15 mg  15 mg Oral QHS Chundi, Vahini, MD   15 mg at 09/17/19 0159  . pantoprazole sodium (PROTONIX) 40 mg/20 mL oral suspension 40 mg  40 mg Oral Daily Chundi, Vahini, MD   40 mg at 09/17/19 1009  . polyvinyl alcohol (LIQUIFILM TEARS) 1.4 % ophthalmic solution 1 drop  1 drop Both Eyes BID Chundi, Vahini, MD      . promethazine (PHENERGAN) tablet 12.5 mg  12.5 mg Oral Q6H PRN Chundi, Vahini, MD      . senna (SENOKOT) tablet 17.2 mg  2 tablet Oral QHS Chundi, Vahini, MD      . senna-docusate (Senokot-S) tablet 1 tablet  1 tablet Oral QHS PRN Lars Mage, MD         Discharge Medications: Please see discharge summary for a list of discharge medications.  Relevant Imaging Results:  Relevant Lab Results:   Additional Information SS#: [id number]  Geralynn Ochs, LCSW

## 2019-09-17 NOTE — Progress Notes (Signed)
   NAME:  Jane [name], MRN:  [id number], DOB:  [birth year], LOS: 1 ADMISSION DATE:  09/16/2019   Subjective  Significantly confused--talking about her mother baking in the kitchen.  Objective   Blood pressure 116/81, pulse 97, temperature (!) 97.1 F (36.2 C), temperature source Tympanic, resp. rate (!) 23, height 4\' 10"  (1.473 m), weight 51 kg, SpO2 93 %.    No intake or output data in the 24 hours ending 09/17/19 0535 Filed Weights   09/16/19 1739  Weight: 51 kg    Examination: GENERAL: acute on chronically ill appearing. HEENT: large hematoma on the left frontal and temporal region with sutured laceration.  CARDIAC: heart RRR. No peripheral edema.  PULMONARY: Lung sounds clear to auscultation. NEURO: severely confused-oriented only to person--not to place or situation. Exam limited by patient's confusion. Equal 5/5 strength in the upper extremities.  Significant Diagnostic Tests:  3/18 CT head> mixed acute on chronic right sided subdural hematoma without evidence of significant midline shift or mass effect; findings concerning for small volume acute intraventricular hemorrhage within the left lateral ventricle 3/19 CT head> bleeding becoming less apparent. No evidence of new bleeding. Minimal mass effect with 1-108mm of right to left shift.  Labs    CBC Latest Ref Rng & Units 09/17/2019 09/16/2019 08/03/2019  WBC 4.0 - 10.5 K/uL 7.2 5.6 4.9  Hemoglobin 12.0 - 15.0 g/dL 9.4(L) 8.9(L) 9.2(A)  Hematocrit 36.0 - 46.0 % 30.2(L) 28.6(L) 27(A)  Platelets 150 - 400 K/uL 169 158 158   BMP Latest Ref Rng & Units 09/17/2019 09/16/2019 08/03/2019  Glucose 70 - 99 mg/dL 113(H) 103(H) -  BUN 8 - 23 mg/dL 63(H) 62(H) 41(A)  Creatinine 0.44 - 1.00 mg/dL 2.73(H) 3.01(H) 1.3(A)  BUN/Creat Ratio 12 - 28 - - -  Sodium 135 - 145 mmol/L 143 141 140  Potassium 3.5 - 5.1 mmol/L 3.9 3.7 4.1  Chloride 98 - 111 mmol/L 104 103 103  CO2 22 - 32 mmol/L 24 24 23(A)  Calcium 8.9 - 10.3 mg/dL 9.1 8.9 9.1     Summary  Jane [name] is an 86yo female who resides at [facility] with PMH including dementia, CVA and a fall in 03/2019 with subsequent large intraventricular hemorrhage associated with SAH. She presented to [facility] after being found face down on the floor beside her bed around 1500-1600 on the afternoon of admission. Head imaging obtained on admission reveals an acute on chronic right subdural hematoma and was subsequently admitted to IMTS for further management.  Assessment & Plan:  Active Problems:   Traumatic subdural hematoma (HCC)  Acute on chronic right subdural hematoma with left frontotemporal skin laceration secondary to fall Neurosurgery consulted in the ED--no neurosurgical intervention indicated at this time Repeat head CT neg for new or additional bleeding Pt denies significant pain Plan Neuro checks Fall precautions  Dementia. Unclear what her baseline is however she was very confused this morning. Will try to speak with her nephew about her baseline today. Weight loss. Suspected to be from poor po intake--possibly progression of dementia. AKI supports this line of thought. Appreciate nutrition consult.   Acute on CKD. Baseline Cr around 1.3. Admission creatinine 3.  Improved this morning. Will continue to monitor.    Best practice:  CODE STATUS: DNR Diet: regular DVT for prophylaxis: SCD Dispo: pending further evaluation    Mitzi Hansen, MD [place] PGY-1 PAGER #: [phone] 09/17/19  5:35 AM

## 2019-09-17 NOTE — ED Notes (Signed)
sz pads  To bed to help prevent pt getting out of bed

## 2019-09-17 NOTE — ED Notes (Signed)
Pt remains pleasantly confused pulling at all line and lkeads

## 2019-09-17 NOTE — ED Notes (Signed)
All meds given per [facility]. Pt tolerating PO well

## 2019-09-17 NOTE — Progress Notes (Signed)
  NEUROSURGERY PROGRESS NOTE   No issues overnight. No concerns this am  EXAM:  BP (!) 122/91   Pulse 88   Temp (!) 97.1 F (36.2 C) (Tympanic)   Resp 18   Ht 4\' 10"  (1.473 m)   Wt 51 kg   SpO2 99%   BMI 23.50 kg/m   Awake, alert, confused Follows commands with encouragement MAEW, seemingly nonfocal  IMPRESSION/PLAN 86 y.o. female found to have a mixed right sided acute on chronic SDH after a witnessed fall at her nursing home. Also with AKI. Repeat head CT is largely unchanged. There remains no role for NS intervention. Will sign off. No need for outpatient f/u. Please call for any concerns.

## 2019-09-17 NOTE — Evaluation (Signed)
Physical Therapy Evaluation Patient Details Name: Jane [name] MRN: [id number] DOB: 05-24-1929 Today's Date: 09/17/2019   History of Present Illness  86 yo female admitted to ED on 3/18 for fall out of bed, imaging reveals acute on chronic R SDH with no midline shift. PMH includes anemia, OA, CAD, CHF, CKD III, dyspnea, HLD, HTN, AAA, dementia.  Clinical Impression   Pt presents with generalized weakness, difficulty performing mobility tasks, visual impairment secondary to inattention vs field cut vs secondary to swelling, impaired cognition, impaired sitting balance, inability to transfer to stand or OOB, and decreased activity tolerance. Pt to benefit from acute PT to address deficits. Pt required max assist for all mobility this session, and was resistant to most mobility until initiated by PT, then pt would participate once in motion. Per RN and chart review, pt from SNF. PT to benefit from continued SNF level of care post-acutely. PT to progress mobility as tolerated, and will continue to follow acutely.      Follow Up Recommendations SNF;Supervision/Assistance - 24 hour    Equipment Recommendations  Other (comment)(defer)    Recommendations for Other Services       Precautions / Restrictions Precautions Precautions: Fall Precaution Comments: bilateral eye drooping, R>L with L inattention noted Restrictions Weight Bearing Restrictions: No      Mobility  Bed Mobility Overal bed mobility: Needs Assistance Bed Mobility: Supine to Sit;Sit to Supine     Supine to sit: Max assist;HOB elevated Sit to supine: Max assist;HOB elevated   General bed mobility comments: Max assist for supine<>sit for trunk and LE management, scooting to and from EOB. Very increased time, pt with difficulty with command following and resistant to initial mobility, but participates when movement is initiated by PT.  Transfers Overall transfer level: Needs assistance Equipment used: Rolling walker  (2 wheeled) Transfers: Sit to/from Stand Sit to Stand: Max assist;From elevated surface         General transfer comment: Max assist for attempted standing, pt with feet sliding forward on attempt with incorrect posture to rise and pt resistant to PT physical corrections. Pt kept insisting "let me do it, don't hold me" so standing with PT physical assist at trunk and bilateral pt hands unable to be attempted today.  Ambulation/Gait             General Gait Details: unable today  Stairs            Wheelchair Mobility    Modified Rankin (Stroke Patients Only)       Balance Overall balance assessment: Needs assistance;History of Falls Sitting-balance support: Feet supported;Single extremity supported Sitting balance-Leahy Scale: Fair Sitting balance - Comments: requires min assist to gain sitting balance and intermittently throughout especially when challenged with dynamic tasks (reaching, LE kicking), but sat independently at EOB for x10 minutes Postural control: Posterior lean   Standing balance-Leahy Scale: Zero Standing balance comment: unable to come to standing                             Pertinent Vitals/Pain Pain Assessment: Faces Faces Pain Scale: Hurts little more Pain Location: L hip, back during mobility Pain Descriptors / Indicators: Discomfort;Grimacing Pain Intervention(s): Limited activity within patient's tolerance;Monitored during session;Repositioned    Home Living Family/patient expects to be discharged to:: Skilled nursing facility([facility] per RN, [facility] per pt)  Prior Function Level of Independence: Needs assistance   Gait / Transfers Assistance Needed: unsure of PLOF, pt states "yeah I walk" but unsure of true baseline  ADL's / Homemaking Assistance Needed: Last admission 6 months ago, pt reports sponge bathing, manages medication indepedently and completes light meal mgmt        Hand  Dominance   Dominant Hand: Right    Extremity/Trunk Assessment   Upper Extremity Assessment Upper Extremity Assessment: Defer to OT evaluation    Lower Extremity Assessment Lower Extremity Assessment: Generalized weakness(symmetric weakness)    Cervical / Trunk Assessment Cervical / Trunk Assessment: Kyphotic  Communication   Communication: HOH  Cognition Arousal/Alertness: Awake/alert Behavior During Therapy: WFL for tasks assessed/performed Overall Cognitive Status: History of cognitive impairments - at baseline Area of Impairment: Orientation;Attention;Memory;Following commands;Safety/judgement;Problem solving                 Orientation Level: Disoriented to;Time;Situation Current Attention Level: Sustained Memory: Decreased short-term memory Following Commands: Follows one step commands inconsistently;Follows one step commands with increased time Safety/Judgement: Decreased awareness of safety;Decreased awareness of deficits   Problem Solving: Slow processing;Decreased initiation;Requires verbal cues;Requires tactile cues;Difficulty sequencing General Comments: history of dementia. A&O to name, 2/3 aspects of birthday (incorrect day given), and place of [facility]. Pt does not know why she is here, that she is in a hospital bed, and goes in and out of talking about apartment. Pt perseverating on "I am just not that hungry this morning, where are my grits". Follows commands inconsistently, very fidgety      General Comments General comments (skin integrity, edema, etc.): HRmax observed 84 bpm. Unable to assess smooth pursuits this session secondary to pt difficulty following instructions. Pt with delayed visual tracking, requiring multimodal cuing to make eye contact (i.e. PT would move to pt's L side, PT spoke and/or touched L side to orient pt to PT location, but pt would look around for 5 seconds before locating PT). L inattention, vs visual impairment (field  cut?) - to assess in future sessions.    Exercises     Assessment/Plan    PT Assessment Patient needs continued PT services  PT Problem List Decreased strength;Decreased mobility;Decreased safety awareness;Decreased cognition;Decreased activity tolerance;Decreased balance;Decreased knowledge of use of DME;Pain;Decreased coordination       PT Treatment Interventions DME instruction;Therapeutic activities;Gait training;Patient/family education;Functional mobility training;Neuromuscular re-education;Balance training;Therapeutic exercise    PT Goals (Current goals can be found in the Care Plan section)  Acute Rehab PT Goals PT Goal Formulation: Patient unable to participate in goal setting Time For Goal Achievement: 10/01/19 Potential to Achieve Goals: Good    Frequency Min 2X/week   Barriers to discharge        Co-evaluation               AM-PAC PT "6 Clicks" Mobility  Outcome Measure Help needed turning from your back to your side while in a flat bed without using bedrails?: Total Help needed moving from lying on your back to sitting on the side of a flat bed without using bedrails?: Total Help needed moving to and from a bed to a chair (including a wheelchair)?: Total Help needed standing up from a chair using your arms (e.g., wheelchair or bedside chair)?: Total Help needed to walk in hospital room?: Total Help needed climbing 3-5 steps with a railing? : Total 6 Click Score: 6    End of Session Equipment Utilized During Treatment: Gait belt Activity Tolerance: Patient tolerated treatment well;Patient limited  by pain Patient left: in bed;with call bell/phone within reach;with nursing/sitter in room(RN in room, will set bed alarm) Nurse Communication: Mobility status PT Visit Diagnosis: Other abnormalities of gait and mobility (R26.89);Muscle weakness (generalized) (M62.81)    Time: [id number] PT Time Calculation (min) (ACUTE ONLY): 34 min   Charges:   PT  Evaluation $PT Eval Low Complexity: 1 Low PT Treatments $Neuromuscular Re-education: 8-22 mins      Riyan Haile E, PT Acute Rehabilitation Services Pager [phone]  Office [phone]   Selso Mannor D Elonda Husky 09/17/2019, 3:18 PM

## 2019-09-17 NOTE — Progress Notes (Addendum)
Patient arrived to [place] and oriented to self and place. Pleasantly confused. Stitches above L eye. Noticeable bruising around L eye. Some vision loss in L eye as well. POC provided to patient. Call bell within reach. Pt currently eating lunch.

## 2019-09-18 ENCOUNTER — Inpatient Hospital Stay (HOSPITAL_COMMUNITY): Payer: Medicare Other

## 2019-09-18 DIAGNOSIS — R4182 Altered mental status, unspecified: Secondary | ICD-10-CM

## 2019-09-18 DIAGNOSIS — N189 Chronic kidney disease, unspecified: Secondary | ICD-10-CM

## 2019-09-18 DIAGNOSIS — E876 Hypokalemia: Secondary | ICD-10-CM

## 2019-09-18 DIAGNOSIS — S0181XA Laceration without foreign body of other part of head, initial encounter: Secondary | ICD-10-CM

## 2019-09-18 LAB — MAGNESIUM: Magnesium: 1.7 mg/dL (ref 1.7–2.4)

## 2019-09-18 LAB — BASIC METABOLIC PANEL
Anion gap: 14 (ref 5–15)
BUN: 61 mg/dL — ABNORMAL HIGH (ref 8–23)
CO2: 23 mmol/L (ref 22–32)
Calcium: 8.6 mg/dL — ABNORMAL LOW (ref 8.9–10.3)
Chloride: 103 mmol/L (ref 98–111)
Creatinine, Ser: 1.91 mg/dL — ABNORMAL HIGH (ref 0.44–1.00)
GFR calc Af Amer: 27 mL/min — ABNORMAL LOW (ref >60)
GFR calc non Af Amer: 23 mL/min — ABNORMAL LOW (ref >60)
Glucose, Bld: 96 mg/dL (ref 70–99)
Potassium: 3 mmol/L — ABNORMAL LOW (ref 3.5–5.1)
Sodium: 140 mmol/L (ref 135–145)

## 2019-09-18 LAB — CBC
HCT: 27.2 % — ABNORMAL LOW (ref 36.0–46.0)
Hemoglobin: 8.8 g/dL — ABNORMAL LOW (ref 12.0–15.0)
MCH: 31.2 pg (ref 26.0–34.0)
MCHC: 32.4 g/dL (ref 30.0–36.0)
MCV: 96.5 fL (ref 80.0–100.0)
Platelets: 167 10*3/uL (ref 150–400)
RBC: 2.82 MIL/uL — ABNORMAL LOW (ref 3.87–5.11)
RDW: 14.1 % (ref 11.5–15.5)
WBC: 4.7 10*3/uL (ref 4.0–10.5)
nRBC: 0 % (ref 0.0–0.2)

## 2019-09-18 LAB — URINE CULTURE: Culture: 50000 — AB

## 2019-09-18 LAB — BLOOD GAS, ARTERIAL
Acid-Base Excess: 2.7 mmol/L — ABNORMAL HIGH (ref 0.0–2.0)
Bicarbonate: 26.2 mmol/L (ref 20.0–28.0)
FIO2: 21
O2 Saturation: 98.1 %
Patient temperature: 37
pCO2 arterial: 36.3 mmHg (ref 32.0–48.0)
pH, Arterial: 7.471 — ABNORMAL HIGH (ref 7.350–7.450)
pO2, Arterial: 85.7 mmHg (ref 83.0–108.0)

## 2019-09-18 LAB — GLUCOSE, CAPILLARY: Glucose-Capillary: 76 mg/dL (ref 70–99)

## 2019-09-18 MED ORDER — LEVETIRACETAM 500 MG PO TABS
500.0000 mg | ORAL_TABLET | Freq: Two times a day (BID) | ORAL | Status: DC
  Filled 2019-09-18: qty 1

## 2019-09-18 MED ORDER — POTASSIUM CHLORIDE CRYS ER 20 MEQ PO TBCR
40.0000 meq | EXTENDED_RELEASE_TABLET | ORAL | Status: DC
  Administered 2019-09-18: 40 meq via ORAL
  Filled 2019-09-18 (×2): qty 2

## 2019-09-18 MED ORDER — SODIUM CHLORIDE 0.9 % IV SOLN
1.0000 g | INTRAVENOUS | Status: DC
  Administered 2019-09-18 – 2019-09-19 (×2): 1 g via INTRAVENOUS
  Filled 2019-09-18 (×2): qty 10

## 2019-09-18 MED ORDER — CIPROFLOXACIN HCL 0.3 % OP SOLN
1.0000 [drp] | OPHTHALMIC | Status: DC
  Administered 2019-09-18 – 2019-09-19 (×6): 1 [drp] via OPHTHALMIC
  Filled 2019-09-18: qty 2.5

## 2019-09-18 MED ORDER — SODIUM CHLORIDE 0.9 % IV SOLN: INTRAVENOUS | Status: AC

## 2019-09-18 MED ORDER — MAGNESIUM SULFATE 2 GM/50ML IV SOLN
2.0000 g | Freq: Once | INTRAVENOUS | Status: AC
  Administered 2019-09-18: 2 g via INTRAVENOUS
  Filled 2019-09-18: qty 50

## 2019-09-18 MED ORDER — CIPROFLOXACIN HCL 0.3 % OP SOLN
1.0000 [drp] | OPHTHALMIC | Status: AC
  Administered 2019-09-18: 1 [drp] via OPHTHALMIC
  Filled 2019-09-18: qty 2.5

## 2019-09-18 MED ORDER — POTASSIUM CHLORIDE 10 MEQ/100ML IV SOLN
10.0000 meq | INTRAVENOUS | Status: AC
  Administered 2019-09-18 (×4): 10 meq via INTRAVENOUS
  Filled 2019-09-18 (×4): qty 100

## 2019-09-18 NOTE — Progress Notes (Signed)
EEG complete - results pending 

## 2019-09-18 NOTE — Significant Event (Addendum)
Rapid Response Event Note  Overview:Called to room with concerns because after RN gave pt crushed meds in applesauce and some ice water, pt began to make a strange sound and began to shake. Per RN, this wasn't a seizure-like shaking but more like the pt had the chills.  Time Called: 2211 Arrival Time: 2215 Event Type: Neurologic  Initial Focused Assessment: Pt laying in bed with eyes closed. Pt able to tell me her name but not oriented to place, time, or situation. Pt able to move all extremities and follow commands. When asked if she feels better, she answers "I don't know." When asked to describe what just happened and how she felt, she doesn't answer. Pupils 3 and brisk. Lung sounds clear and diminished, T-97.,7, HR-67, RR-16, SpO2-99% on RA  Interventions: No RRT interventions Plan of Care (if not transferred): Happenings discussed with Kennon Holter, NP. Continue to monitor pt. Call RRT if further assistance needed.  Event Summary: Name of Physician Notified: Kennon Holter, NP at 2230    at    Outcome: Stayed in room and stabalized  Event End Time: 2230  Jane [name]

## 2019-09-18 NOTE — Evaluation (Signed)
Clinical/Bedside Swallow Evaluation Patient Details  Name: Jane [name] MRN: [id number] Date of Birth: 07/26/1928  Today's Date: 09/18/2019 Time: SLP Start Time (ACUTE ONLY): 1430 SLP Stop Time (ACUTE ONLY): 1455 SLP Time Calculation (min) (ACUTE ONLY): 25 min  Past Medical History:  Past Medical History:  Diagnosis Date  . Anemia   . Aortic insufficiency    mild  . Arthritis    "arms" (08/09/2015)  . CAD (coronary artery disease)    cath 08/09/2014 95% stenosis in prox to mid RCA s/p DES, 80-90% prox OM2, 50% distal LAD  . CHF (congestive heart failure) ([place])   . CKD (chronic kidney disease) stage 3, GFR 30-59 ml/min   . Colon polyp    2009 colonoscopy, not retrieved for pathology  . Dyspnea   . Ectropion of left lower eyelid   . GERD (gastroesophageal reflux disease) 2009   EGD with benign gastric polyp too  . Hyperlipidemia   . Hypertension   . Pneumonia    history of  . Stroke ([place]) 1975  . Vitamin D deficiency    Past Surgical History:  Past Surgical History:  Procedure Laterality Date  . ABDOMINAL HYSTERECTOMY    . APPENDECTOMY    . ARTERY BIOPSY Left 12/16/2012   Procedure: BIOPSY TEMPORAL ARTERY;  Surgeon: Mal Misty, MD;  Location: [facility];  Service: Vascular;  Laterality: Left;  . CARDIAC CATHETERIZATION    . LEFT HEART CATHETERIZATION WITH CORONARY ANGIOGRAM N/A 08/09/2014   Procedure: LEFT HEART CATHETERIZATION WITH CORONARY ANGIOGRAM;  Surgeon: Peter M Martinique, MD;  Location: [facility] CATH LAB;  Service: Cardiovascular;  Laterality: N/A;  . PERCUTANEOUS CORONARY STENT INTERVENTION (PCI-S)  08/09/2014   Procedure: PERCUTANEOUS CORONARY STENT INTERVENTION (PCI-S);  Surgeon: Peter M Martinique, MD;  Location: [facility] CATH LAB;  Service: Cardiovascular;;  . TONSILLECTOMY     HPI:  [name] with PMH: anemia, OA, CAD, CHF, CKD III, dyspnea, HLD, HTN, AAA, dementia. She admitted to ED from SNF on 3/18 for fall out of bed, imaging reveals acute on chronic R SDH  with no midline shift.   Assessment / Plan / Recommendation Clinical Impression  Patient presents with a mild-mod oral dysphagia with likely impact from cognitive impairment related to dementia diagnosis. Patient drank approximately 4-5 ounces of thin liquids (water and ginger ale) without overt s/s of aspiration or penetration and with larygneal elevation and pharyngeal contraction that were both [facility] per palpation. She did exhibit a mild delay in oral transit of puree solids but exhibited good swallow initiation. SLP Visit Diagnosis: Dysphagia, unspecified (R13.10)    Aspiration Risk  Mild aspiration risk    Diet Recommendation Dysphagia 1 (Puree);Thin liquid   Liquid Administration via: Cup;Straw Medication Administration: Crushed with puree Supervision: Full supervision/cueing for compensatory strategies;Staff to assist with self feeding Compensations: Minimize environmental distractions;Slow rate;Small sips/bites Postural Changes: Seated upright at 90 degrees    Other  Recommendations Oral Care Recommendations: Oral care BID   Follow up Recommendations Other (comment);Skilled Nursing facility      Frequency and Duration min 1 x/week  1 week       Prognosis Prognosis for Safe Diet Advancement: Fair Barriers to Reach Goals: Cognitive deficits      Swallow Study   General Date of Onset: 09/16/19 HPI: [name] with PMH: anemia, OA, CAD, CHF, CKD III, dyspnea, HLD, HTN, AAA, dementia. She admitted to ED from SNF on 3/18 for fall out of bed, imaging  reveals acute on chronic R SDH with no midline shift. Type of Study: Bedside Swallow Evaluation Previous Swallow Assessment: N/A Diet Prior to this Study: NPO Temperature Spikes Noted: No Respiratory Status: Room air History of Recent Intubation: No Behavior/Cognition: Alert;Cooperative;Confused;Pleasant mood;Requires cueing Oral Cavity Assessment: Within Functional Limits Oral Care Completed by SLP: Yes Oral  Cavity - Dentition: Edentulous;Other (Comment);Dentures, not available(patient reported dentures at home) Self-Feeding Abilities: Needs set up;Needs assist;Total assist Patient Positioning: Upright in bed;Postural control adequate for testing Baseline Vocal Quality: Low vocal intensity Volitional Cough: Cognitively unable to elicit Volitional Swallow: Unable to elicit    Oral/Motor/Sensory Function Overall Oral Motor/Sensory Function: Within functional limits   Ice Chips     Thin Liquid Thin Liquid: Within functional limits Presentation: Straw Other Comments: No overt s/s of aspiration or penetration with successive straw sips of thin liquids (water)    Nectar Thick     Honey Thick     Puree Puree: Impaired Presentation: Spoon Oral Phase Functional Implications: Prolonged oral transit   Solid     Solid: Not tested      Jane [name] 09/18/2019,4:01 PM   Jane Baller, MA, CCC-SLP Speech Therapy [facility] Acute Rehab

## 2019-09-18 NOTE — Progress Notes (Signed)
Ct head reviewed, not much change in her chronic sdh. This is not likely causing her lethargy. We will start her on keppra to make sure this is not seizure activity. There is not surgical intervention that we can offer her given her age and comorbidities. The risks far outweigh the benefits. Would recommend holding off on discharge until tomorrow to see if she becomes more alert on the keppra. Will sign off for now.

## 2019-09-18 NOTE — TOC Progression Note (Signed)
Transition of Care [facility]) - Progression Note    Patient Details  Name: Jane [name] MRN: [id number] Date of Birth: 12-Feb-1929  Transition of Care [facility]) CM/SW [place], [place] Phone Number: 09/18/2019, 9:40 AM  Clinical Narrative:    CSW contacted by RN and informed patient will likely not discharge today due to continued medical workup. CSW will continue to follow & assist with discharge planning needs.   Expected Discharge Plan: [place] Barriers to Discharge: Continued Medical Work up  Expected Discharge Plan and Services Expected Discharge Plan: [place] Choice: [place] arrangements for the past 2 months: [place]                                       Social Determinants of Health (SDOH) Interventions    Readmission Risk Interventions No flowsheet data found.

## 2019-09-18 NOTE — Progress Notes (Signed)
UPDATE NOTE  Spoke with patient's nephew regarding pt's hospital course and updated on new bleed seen on today's scan. All questions were answered and nephew encouraged to call the floor if he has any further questions.

## 2019-09-18 NOTE — Progress Notes (Signed)
NAME:  Jane [name], MRN:  [id number], DOB:  05/04/1929, LOS: 2 ADMISSION DATE:  09/16/2019   Subjective  somulent this morning. Bedside RN notes that night shift reported she was like this overnight.   Objective   Blood pressure (!) 109/45, pulse 69, temperature 98.8 F (37.1 C), temperature source Oral, resp. rate 18, height 4\' 10"  (1.473 m), weight 51 kg, SpO2 98 %.     Intake/Output Summary (Last 24 hours) at 09/18/2019 1145 Last data filed at 09/18/2019 0600 Gross per 24 hour  Intake 360 ml  Output 600 ml  Net -240 ml   Filed Weights   09/16/19 1739  Weight: 51 kg    Examination: GENERAL: somulent HEENT: large hematoma on the left frontal and temporal region with sutured laceration. Right lower eyelid eversion CARDIAC: heart RRR.  PULMONARY: Lung sounds clear to auscultation. NEURO: minimally responsive. PERRL. Withdraws to pain in all extremities--lower>upper  Significant Diagnostic Tests:  3/18 CT head> mixed acute on chronic right sided subdural hematoma without evidence of significant midline shift or mass effect; findings concerning for small volume acute intraventricular hemorrhage within the left lateral ventricle 3/19 CT head> bleeding becoming less apparent. No evidence of new bleeding. Minimal mass effect with 1-38mm of right to left shift. 3/20 CT head> slight interval increase in subdural bleed. New SAH.  Labs    CBC Latest Ref Rng & Units 09/18/2019 09/17/2019 09/16/2019  WBC 4.0 - 10.5 K/uL 4.7 7.2 5.6  Hemoglobin 12.0 - 15.0 g/dL 8.8(L) 9.4(L) 8.9(L)  Hematocrit 36.0 - 46.0 % 27.2(L) 30.2(L) 28.6(L)  Platelets 150 - 400 K/uL 167 169 158   BMP Latest Ref Rng & Units 09/18/2019 09/17/2019 09/16/2019  Glucose 70 - 99 mg/dL 96 113(H) 103(H)  BUN 8 - 23 mg/dL 61(H) 63(H) 62(H)  Creatinine 0.44 - 1.00 mg/dL 1.91(H) 2.73(H) 3.01(H)  BUN/Creat Ratio 12 - 28 - - -  Sodium 135 - 145 mmol/L 140 143 141  Potassium 3.5 - 5.1 mmol/L 3.0(L) 3.9 3.7  Chloride 98 -  111 mmol/L 103 104 103  CO2 22 - 32 mmol/L 23 24 24   Calcium 8.9 - 10.3 mg/dL 8.6(L) 9.1 8.9    Summary  Jane [name] is an 86yo female who resides at [facility] with PMH including dementia, CVA and a fall in 03/2019 with subsequent large intraventricular hemorrhage associated with SAH. She presented to [facility] after being found face down on the floor beside her bed around 1500-1600 on the afternoon of admission. Head imaging obtained on admission reveals an acute on chronic right subdural hematoma and was subsequently admitted to IMTS for further management.  Assessment & Plan:  Active Problems:   Traumatic subdural hematoma (HCC)   Protein-calorie malnutrition, severe  Acute on chronic right subdural hematoma with left frontotemporal skin laceration secondary to fall Repeat head CT on 3/20 was neg for new or additional bleeding. Obtained a another head CT this morning for the acute mental status change which shows slight interval increase in frontal subdural bleeding and new SAH. No change in midline shift. Discussed results with neurosurgery who does not see indication for surgical intervention at this time and started keppra. Plan Will hold off on the keppra for now until EEG is obtained.  Will also obtain an ABG Will hold remeron and try to avoid sedating medications. Will speak to family later today Neuro checks Fall precautions NPO for now until mental status improves  Acute on CKD. Continuing to improve.  Hypokalemia present this  morning. Mg 1.7. Will continue to replete as needed.  Best practice:  CODE STATUS: DNR Diet: regular DVT for prophylaxis: SCD Dispo: will hold off on d/c as was previously planned for today. May be able to return in a day or two   Mitzi Hansen, MD [place] PGY-1 PAGER #: [phone] 09/18/19  11:45 AM

## 2019-09-18 NOTE — Procedures (Signed)
ELECTROENCEPHALOGRAM REPORT   Patient: Jane [name]       Room #: [id number] EEG No. ID: [id number] Age: 86 y.o.        Sex: female Requesting Physician: [place] Report Date:  09/18/2019        Interpreting Physician: Alexis Goodell  History: [name] is an 86 y.o. female with altered mental status and SAH  Medications:  Lipitor, Rocephin  Conditions of Recording:  This is a 21 channel routine scalp EEG performed with bipolar and monopolar montages arranged in accordance to the international 10/20 system of electrode placement. One channel was dedicated to EKG recording.  The patient is in the awake and drowsy states.  Description:  Artifact is prominent during the recording often obscuring the background rhythm. When able to be visualized the posterior background rhythm consists of a low voltage, symmetrical, fairly well organized, 8 Hz alpha activity, seen from the parieto-occipital and posterior temporal regions.   The patient drowses briefly with slowing to irregular, low voltage theta and beta activity.   No epileptiform activity is noted.   Hyperventilation and intermittent photic stimulation were not performed.  IMPRESSION: This is a technically difficult recording due to the presence of artifact.  The patient does exhibit normal wakefulness and drowse.  No epileptiform activity is noted.     Alexis Goodell, MD Neurology [phone] 09/18/2019, 2:33 PM

## 2019-09-19 DIAGNOSIS — S0181XD Laceration without foreign body of other part of head, subsequent encounter: Secondary | ICD-10-CM | POA: Diagnosis not present

## 2019-09-19 DIAGNOSIS — S065X9A Traumatic subdural hemorrhage with loss of consciousness of unspecified duration, initial encounter: Secondary | ICD-10-CM | POA: Diagnosis not present

## 2019-09-19 DIAGNOSIS — J449 Chronic obstructive pulmonary disease, unspecified: Secondary | ICD-10-CM | POA: Diagnosis not present

## 2019-09-19 DIAGNOSIS — Z9181 History of falling: Secondary | ICD-10-CM | POA: Diagnosis not present

## 2019-09-19 DIAGNOSIS — R1311 Dysphagia, oral phase: Secondary | ICD-10-CM | POA: Diagnosis not present

## 2019-09-19 DIAGNOSIS — G9340 Encephalopathy, unspecified: Secondary | ICD-10-CM | POA: Diagnosis not present

## 2019-09-19 DIAGNOSIS — Z7189 Other specified counseling: Secondary | ICD-10-CM | POA: Diagnosis not present

## 2019-09-19 DIAGNOSIS — Z7401 Bed confinement status: Secondary | ICD-10-CM | POA: Diagnosis not present

## 2019-09-19 DIAGNOSIS — I959 Hypotension, unspecified: Secondary | ICD-10-CM | POA: Diagnosis not present

## 2019-09-19 DIAGNOSIS — M6281 Muscle weakness (generalized): Secondary | ICD-10-CM | POA: Diagnosis not present

## 2019-09-19 DIAGNOSIS — I5032 Chronic diastolic (congestive) heart failure: Secondary | ICD-10-CM | POA: Diagnosis not present

## 2019-09-19 DIAGNOSIS — N1832 Chronic kidney disease, stage 3b: Secondary | ICD-10-CM | POA: Diagnosis not present

## 2019-09-19 DIAGNOSIS — I69398 Other sequelae of cerebral infarction: Secondary | ICD-10-CM | POA: Diagnosis not present

## 2019-09-19 DIAGNOSIS — N179 Acute kidney failure, unspecified: Secondary | ICD-10-CM | POA: Diagnosis not present

## 2019-09-19 DIAGNOSIS — R41841 Cognitive communication deficit: Secondary | ICD-10-CM | POA: Diagnosis not present

## 2019-09-19 DIAGNOSIS — E43 Unspecified severe protein-calorie malnutrition: Secondary | ICD-10-CM | POA: Diagnosis not present

## 2019-09-19 DIAGNOSIS — N390 Urinary tract infection, site not specified: Secondary | ICD-10-CM | POA: Diagnosis not present

## 2019-09-19 DIAGNOSIS — M2550 Pain in unspecified joint: Secondary | ICD-10-CM | POA: Diagnosis not present

## 2019-09-19 DIAGNOSIS — F0150 Vascular dementia without behavioral disturbance: Secondary | ICD-10-CM | POA: Diagnosis not present

## 2019-09-19 DIAGNOSIS — R531 Weakness: Secondary | ICD-10-CM | POA: Diagnosis not present

## 2019-09-19 DIAGNOSIS — S065X0D Traumatic subdural hemorrhage without loss of consciousness, subsequent encounter: Secondary | ICD-10-CM | POA: Diagnosis not present

## 2019-09-19 DIAGNOSIS — N183 Chronic kidney disease, stage 3 unspecified: Secondary | ICD-10-CM | POA: Diagnosis not present

## 2019-09-19 DIAGNOSIS — I714 Abdominal aortic aneurysm, without rupture: Secondary | ICD-10-CM | POA: Diagnosis not present

## 2019-09-19 DIAGNOSIS — R0902 Hypoxemia: Secondary | ICD-10-CM | POA: Diagnosis not present

## 2019-09-19 DIAGNOSIS — B9689 Other specified bacterial agents as the cause of diseases classified elsewhere: Secondary | ICD-10-CM | POA: Diagnosis not present

## 2019-09-19 DIAGNOSIS — I130 Hypertensive heart and chronic kidney disease with heart failure and stage 1 through stage 4 chronic kidney disease, or unspecified chronic kidney disease: Secondary | ICD-10-CM | POA: Diagnosis not present

## 2019-09-19 DIAGNOSIS — I10 Essential (primary) hypertension: Secondary | ICD-10-CM | POA: Diagnosis not present

## 2019-09-19 DIAGNOSIS — I872 Venous insufficiency (chronic) (peripheral): Secondary | ICD-10-CM | POA: Diagnosis not present

## 2019-09-19 DIAGNOSIS — I739 Peripheral vascular disease, unspecified: Secondary | ICD-10-CM | POA: Diagnosis not present

## 2019-09-19 LAB — COMPREHENSIVE METABOLIC PANEL
ALT: 10 U/L (ref 0–44)
AST: 19 U/L (ref 15–41)
Albumin: 2.5 g/dL — ABNORMAL LOW (ref 3.5–5.0)
Alkaline Phosphatase: 44 U/L (ref 38–126)
Anion gap: 7 (ref 5–15)
BUN: 47 mg/dL — ABNORMAL HIGH (ref 8–23)
CO2: 22 mmol/L (ref 22–32)
Calcium: 8.2 mg/dL — ABNORMAL LOW (ref 8.9–10.3)
Chloride: 110 mmol/L (ref 98–111)
Creatinine, Ser: 1.33 mg/dL — ABNORMAL HIGH (ref 0.44–1.00)
GFR calc Af Amer: 41 mL/min — ABNORMAL LOW (ref >60)
GFR calc non Af Amer: 36 mL/min — ABNORMAL LOW (ref >60)
Glucose, Bld: 122 mg/dL — ABNORMAL HIGH (ref 70–99)
Potassium: 4.1 mmol/L (ref 3.5–5.1)
Sodium: 139 mmol/L (ref 135–145)
Total Bilirubin: 0.8 mg/dL (ref 0.3–1.2)
Total Protein: 6.1 g/dL — ABNORMAL LOW (ref 6.5–8.1)

## 2019-09-19 LAB — CBC WITH DIFFERENTIAL/PLATELET
Abs Immature Granulocytes: 0.02 10*3/uL (ref 0.00–0.07)
Basophils Absolute: 0 10*3/uL (ref 0.0–0.1)
Basophils Relative: 0 %
Eosinophils Absolute: 0 10*3/uL (ref 0.0–0.5)
Eosinophils Relative: 0 %
HCT: 26.6 % — ABNORMAL LOW (ref 36.0–46.0)
Hemoglobin: 8.4 g/dL — ABNORMAL LOW (ref 12.0–15.0)
Immature Granulocytes: 1 %
Lymphocytes Relative: 30 %
Lymphs Abs: 1.2 10*3/uL (ref 0.7–4.0)
MCH: 30.8 pg (ref 26.0–34.0)
MCHC: 31.6 g/dL (ref 30.0–36.0)
MCV: 97.4 fL (ref 80.0–100.0)
Monocytes Absolute: 0.5 10*3/uL (ref 0.1–1.0)
Monocytes Relative: 11 %
Neutro Abs: 2.4 10*3/uL (ref 1.7–7.7)
Neutrophils Relative %: 58 %
Platelets: 178 10*3/uL (ref 150–400)
RBC: 2.73 MIL/uL — ABNORMAL LOW (ref 3.87–5.11)
RDW: 14.1 % (ref 11.5–15.5)
WBC: 4.1 10*3/uL (ref 4.0–10.5)
nRBC: 0 % (ref 0.0–0.2)

## 2019-09-19 LAB — GLUCOSE, CAPILLARY
Glucose-Capillary: 53 mg/dL — ABNORMAL LOW (ref 70–99)
Glucose-Capillary: 103 mg/dL — ABNORMAL HIGH (ref 70–99)
Glucose-Capillary: 39 mg/dL — CL (ref 70–99)
Glucose-Capillary: 80 mg/dL (ref 70–99)

## 2019-09-19 MED ORDER — DEXTROSE 50 % IV SOLN: 25.0000 g | INTRAVENOUS | Status: AC

## 2019-09-19 MED ORDER — DEXTROSE 50 % IV SOLN
INTRAVENOUS | Status: AC
  Administered 2019-09-19: 50 mL via INTRAVENOUS
  Filled 2019-09-19: qty 50

## 2019-09-19 NOTE — Progress Notes (Signed)
  Date: 09/19/2019  Patient name: Jane [name]  Medical record number: [id number]  Date of birth: 09/29/1928   This patient's plan of care was discussed with the house staff. Please see Dr. Aurelio Jew note for complete details. I concur with her findings.   Sid Falcon, MD 09/19/2019, 9:00 PM

## 2019-09-19 NOTE — Progress Notes (Signed)
   Subjective:   She is feeling much better today. She still feels slightly tired. She denies pain, shortness of breath, difficulty with urination or weakness.  Objective:  Vital signs in last 24 hours: Vitals:   09/18/19 1930 09/18/19 2314 09/19/19 0414 09/19/19 0744  BP: (!) 120/49 133/68 (!) 143/54 (!) 135/96  Pulse: 62 64 66 72  Resp: 16 16 20 16   Temp: 97.6 F (36.4 C) (!) 97.4 F (36.3 C) 98.2 F (36.8 C) (!) 97.4 F (36.3 C)  TempSrc: Oral Oral Oral Oral  SpO2: 100% 96% 100% 100%  Weight:      Height:       Constitution: NAD, appears stated age HENT: laceration over left eyebrown with healing hematoma, no erythema or drainage, sutures in place Eyes: eom intact, PERRLA Cardio: RRR, no m/r/g, no LE edema  Respiratory: CTA, no w/r/r MSK: moving all extremities, strength symmetrical upper and lower extremities  Neuro: normal affect, alert, oriented to self only  Skin: c/d/i   Assessment/Plan:  Active Problems:   SDH (subdural hematoma) (HCC)   Protein-calorie malnutrition, severe   Facial laceration  86yo female with PMH dementia, CVA, recurrent falls admitted for fall at ALF with SDH on imaging and laceration above left eyebrow.   Acute on chronic right subdural hematoma with left frontotemporal skin laceration secondary to fall Repeat head CT done yesterday showed interval increase in subdural collection from 65mm to 12 mm with no change in midline shift. Per neurosurgery this would not explain her acute encephalopathy. EEG was done without findings concerning for seizure. VBG was normal. Her remeron was held overnight with concerns for excessive sedations. She is back to her baseline mental status with severe dementia this morning.   - hold remeron at discharge and other centrally acting medications - per neurosurgery does not need follow-up.  UTI Switched to ceftriaxone with increased somnolence yesterday. Will restart keflex and will complete total of 5 days of  antibiotics  AKI on CKD Stage III She is back to baseline after fluid resuscitation.    VTE: SCDs IVF: none Diet: regular Code: DNR  Dispo: Anticipated discharge today or tomorrow.   Molli Hazard A, DO 09/19/2019, 11:48 AM Pager: [phone]

## 2019-09-19 NOTE — Progress Notes (Signed)
Patient is discharging back to [facility]. [facility] has been called for transportation. Report called to nurse at receiving facility. IVs removed, tele removed. Discharge paperwork sent with [facility] to receiving facility. [place]

## 2019-09-19 NOTE — Progress Notes (Signed)
Hypoglycemic Event  CBG: 53  Treatment: Dextrose 25g   Symptoms: None  Follow-up CBG: Time:0712 CBG Result:103  Possible Reasons for Event: Poor oral intake   Comments/MD notified: Page APP X. [place]

## 2019-09-19 NOTE — Progress Notes (Signed)
Occupational Therapy Treatment Patient Details Name: Jane [name] MRN: [id number] DOB: 14-Mar-1929 Today's Date: 09/19/2019    History of present illness 86 yo female admitted to ED on 3/18 for fall out of bed, imaging reveals acute on chronic R SDH with no midline shift. PMH includes anemia, OA, CAD, CHF, CKD III, dyspnea, HLD, HTN, AAA, dementia.   OT comments  Pt PTA Living in SNF. Pt currently appears to be close to her functional baseline for ADL. Pt grimacing in pain at EOB so further mobility deferred. Pt following few commands, wanting to rest and resistive to all mobility. Pt does not require continued acute OT skilled services as pt not following all commands and pt with dementia at baseline. Thank you for this referral. OT signing off.    Follow Up Recommendations  SNF(return to SNF)    Equipment Recommendations  None recommended by OT    Recommendations for Other Services      Precautions / Restrictions Precautions Precautions: Fall Precaution Comments: bilateral eye drooping, R>L with L inattention noted Restrictions Weight Bearing Restrictions: No       Mobility Bed Mobility Overal bed mobility: Needs Assistance Bed Mobility: Supine to Sit;Sit to Supine     Supine to sit: Max assist;HOB elevated Sit to supine: Max assist;HOB elevated   General bed mobility comments: Assist for trunk elevation and movement of BLEs.  Transfers                 General transfer comment: deferred as pt not handling sitting at EOB today    Balance Overall balance assessment: Needs assistance;History of Falls Sitting-balance support: Feet supported;Single extremity supported Sitting balance-Leahy Scale: Poor Sitting balance - Comments: Pt wincing in pain and not wanting to continue to sit at EOB >30 secs.                                   ADL either performed or assessed with clinical judgement   ADL Overall ADL's : At baseline                                       General ADL Comments: Pt requiring near total care at this time. Pt's eyes drooping and RN reports that she was told that this is the pt's baseline at SNF.     Vision Patient Visual Report: Other (comment)(unsure) Vision Assessment?: Vision impaired- to be further tested in functional context Additional Comments: Pt with lids half shut due to fall hitting near L eye and very droopy eyes   Perception     Praxis      Cognition Arousal/Alertness: Awake/alert Behavior During Therapy: WFL for tasks assessed/performed Overall Cognitive Status: History of cognitive impairments - at baseline Area of Impairment: Orientation;Attention;Memory;Following commands;Safety/judgement;Problem solving                 Orientation Level: Disoriented to;Time;Situation Current Attention Level: Sustained Memory: Decreased short-term memory Following Commands: Follows one step commands inconsistently;Follows one step commands with increased time Safety/Judgement: Decreased awareness of safety;Decreased awareness of deficits   Problem Solving: Slow processing;Decreased initiation;Requires verbal cues;Requires tactile cues;Difficulty sequencing General Comments: history of dementia. A&O to name, 2/3 aspects of birthday (incorrect day given), and place of [facility] hospital. Pt does not know why she is here, that she is in a hospital bed, and goes  in and out of talking about apartment. Pt perseverating on "I am just not that hungry this morning, where are my grits". Follows commands inconsistently, very fidgety        Exercises     Shoulder Instructions       General Comments Pt sipping Ensure x2 sips and not willing to try her breakfast.    Pertinent Vitals/ Pain       Pain Assessment: Faces Faces Pain Scale: Hurts little more Pain Location: back and L hip during bed mobility Pain Descriptors / Indicators: Discomfort;Grimacing Pain Intervention(s): Monitored  during session;Limited activity within patient's tolerance  Home Living Family/patient expects to be discharged to:: Skilled nursing facility                                        Prior Functioning/Environment Level of Independence: Needs assistance  Gait / Transfers Assistance Needed: Pt reporting that she does not walk often at facility. ADL's / Homemaking Assistance Needed: Pt stating that she usuallly requires assistance       Frequency           Progress Toward Goals  OT Goals(current goals can now be found in the care plan section)        Plan      Co-evaluation                 AM-PAC OT "6 Clicks" Daily Activity     Outcome Measure   Help from another person eating meals?: Total Help from another person taking care of personal grooming?: A Lot Help from another person toileting, which includes using toliet, bedpan, or urinal?: Total Help from another person bathing (including washing, rinsing, drying)?: Total Help from another person to put on and taking off regular upper body clothing?: Total Help from another person to put on and taking off regular lower body clothing?: Total 6 Click Score: 7    End of Session    OT Visit Diagnosis: Unsteadiness on feet (R26.81);Muscle weakness (generalized) (M62.81)   Activity Tolerance Patient limited by fatigue;Patient limited by pain   Patient Left in bed;with call bell/phone within reach;with bed alarm set   Nurse Communication Mobility status        Time: [id number] OT Time Calculation (min): 21 min  Charges: OT General Charges $OT Visit: 1 Visit OT Evaluation $OT Eval Moderate Complexity: 1 Mod  Jefferey Pica, OTR/L Acute Rehabilitation Services Pager: [phone] Office: [phone]    Markice Torbert C 09/19/2019, 1:41 PM

## 2019-09-19 NOTE — TOC Transition Note (Signed)
Transition of Care [facility]) - CM/SW Discharge Note   Patient Details  Name: DEMIRA DIAB MRN: [id number] Date of Birth: 1929-02-03  Transition of Care [facility]) CM/SW Contact:  Arvella Merles, LCSW Phone Number: 09/19/2019, 1:29 PM   Clinical Narrative:    Patient will DC to: [facility] Anticipated DC date: 09/19/19 Family notified: Constance Holster called, left msg. Transport by: Corey Harold   Per MD patient ready for DC to [facility]. RN, patient, patient's family, and facility notified of DC. Discharge Summary and FL2 sent to facility. RN to call report prior to discharge [phone] Room 311). DC packet on chart. Ambulance transport requested for patient.   CSW will sign off for now as social work intervention is no longer needed. Please consult [place] again if new needs arise.    Final next level of care: Skilled Nursing Facility Barriers to Discharge: No Barriers Identified   Patient Goals and CMS Choice Patient states their goals for this hospitalization and ongoing recovery are:: patient unable to participate in goal setting due to confusion CMS Medicare.gov Compare Post Acute Care list provided to:: Patient Represenative (must comment)(Nephew) Choice offered to / list presented to : (Nephews)  Discharge Placement   Existing PASRR number confirmed : 09/17/19          Patient chooses bed at: [facility] Patient to be transferred to facility by: [facility] Name of family member notified: [name] Patient and family notified of of transfer: 09/19/19  Discharge Plan and Services     Post Acute Care Choice: [place]                               Social Determinants of Health (SDOH) Interventions     Readmission Risk Interventions No flowsheet data found.

## 2019-09-20 ENCOUNTER — Non-Acute Institutional Stay (SKILLED_NURSING_FACILITY): Payer: Medicare Other | Admitting: Adult Health

## 2019-09-20 ENCOUNTER — Encounter: Payer: Self-pay | Admitting: Adult Health

## 2019-09-20 ENCOUNTER — Telehealth: Payer: Self-pay | Admitting: Adult Health

## 2019-09-20 ENCOUNTER — Other Ambulatory Visit: Payer: Medicare Other

## 2019-09-20 DIAGNOSIS — N390 Urinary tract infection, site not specified: Secondary | ICD-10-CM

## 2019-09-20 DIAGNOSIS — F0150 Vascular dementia without behavioral disturbance: Secondary | ICD-10-CM

## 2019-09-20 DIAGNOSIS — S065XAA Traumatic subdural hemorrhage with loss of consciousness status unknown, initial encounter: Secondary | ICD-10-CM

## 2019-09-20 DIAGNOSIS — N179 Acute kidney failure, unspecified: Secondary | ICD-10-CM

## 2019-09-20 DIAGNOSIS — S065X9A Traumatic subdural hemorrhage with loss of consciousness of unspecified duration, initial encounter: Secondary | ICD-10-CM | POA: Diagnosis not present

## 2019-09-20 DIAGNOSIS — I5032 Chronic diastolic (congestive) heart failure: Secondary | ICD-10-CM | POA: Diagnosis not present

## 2019-09-20 DIAGNOSIS — N1832 Chronic kidney disease, stage 3b: Secondary | ICD-10-CM | POA: Diagnosis not present

## 2019-09-20 DIAGNOSIS — E43 Unspecified severe protein-calorie malnutrition: Secondary | ICD-10-CM

## 2019-09-20 NOTE — Telephone Encounter (Signed)
I called and relayed that per JM/NP that pt had CT recently, no need to repeat.  Jane [name], verbalized understanding.

## 2019-09-20 NOTE — Progress Notes (Signed)
Location:  [facility] Room Number: [id number] Place of Service:  SNF (31) Provider:  Durenda Age, DNP, FNP-BC  Patient Care Team: Hendricks Limes, MD as PCP - General (Internal Medicine) Medina-Vargas, Senaida Lange, NP as Nurse Practitioner (Internal Medicine)  Extended Emergency Contact Information Primary Emergency Contact: [name] Mobile Phone: [phone] Relation: Nephew Secondary Emergency Contact: Ernie Avena Mobile Phone: [phone] Relation: Relative  Code Status:  DNR  Goals of care: Advanced Directive information Advanced Directives 09/20/2019  Does Patient Have a Medical Advance Directive? Yes  Type of Advance Directive Out of facility DNR (pink MOST or yellow form)  Does patient want to make changes to medical advance directive? No - Patient declined  Copy of [place] in Chart? -  Would patient like information on creating a medical advance directive? -  Pre-existing out of facility DNR order (yellow form or pink MOST form) Yellow form placed in chart (order not valid for inpatient use)     Chief Complaint  Patient presents with  . Acute Visit    Hospital followup, status post admission at [facility] 3/18-3/21/21 for subdural hematoma    HPI:  Pt is an 86 y.o. female who is a long-term care resident of [facility] had a fall and was transferred to the hospital on 09/16/19 S/P fall sustaining laceration above left eye and subdural hematoma on imaging. She had somnolence and a repeat head CT showed interval increase in subdural collection from 10 mm to 12 mm with no change in midline shift. Per neurosurgery, this would not explain her acute encephalopathy. EEG was done without findings concerning for seizure. Remeron was held with concerns for excessive sedation. Per neurosurgery, she does not need follow up. She was treated for UTI and was started on Ceftriaxone then switched to Keflex. She was  also treated for AKI with IV fluids.  She was re-admitted to [facility] on 09/19/19. She was seen in her room today. CNA reported that she ate only 25% for breakfast and lunch yesterday. Resident verbalized not wanting to eat and that she has eaten all she wanted. Noted that she did not even eat her breakfast. Talked to Mr. [name], nephew, and discussed weight loss and feeding tube. He is concerned about resident pulling out the her peg tube. Discussed palliative consult. He said that he is going to think about it and wants resident to be comfortable. She had weight loss of 5.58% o4 6.4 lbs in 21 days, 19.85% or 26.8 lbs in 101 days, and 25.58% or 37.2 lbs in 168 days. She has a PMH of CHF, CKD stage III, HTN, HLD, aortic insufficiency, and CAD.   Past Medical History:  Diagnosis Date  . Anemia   . Aortic insufficiency    mild  . Arthritis    "arms" (08/09/2015)  . CAD (coronary artery disease)    cath 08/09/2014 95% stenosis in prox to mid RCA s/p DES, 80-90% prox OM2, 50% distal LAD  . CHF (congestive heart failure) ([place])   . CKD (chronic kidney disease) stage 3, GFR 30-59 ml/min   . Colon polyp    2009 colonoscopy, not retrieved for pathology  . Dyspnea   . Ectropion of left lower eyelid   . GERD (gastroesophageal reflux disease) 2009   EGD with benign gastric polyp too  . Hyperlipidemia   . Hypertension   . Pneumonia    history of  . Stroke ([place]) 1975  .  Vitamin D deficiency    Past Surgical History:  Procedure Laterality Date  . ABDOMINAL HYSTERECTOMY    . APPENDECTOMY    . ARTERY BIOPSY Left 12/16/2012   Procedure: BIOPSY TEMPORAL ARTERY;  Surgeon: Mal Misty, MD;  Location: [facility];  Service: Vascular;  Laterality: Left;  . CARDIAC CATHETERIZATION    . LEFT HEART CATHETERIZATION WITH CORONARY ANGIOGRAM N/A 08/09/2014   Procedure: LEFT HEART CATHETERIZATION WITH CORONARY ANGIOGRAM;  Surgeon: Peter M Martinique, MD;  Location[facility] C[facility] LAB;  Service:  Cardiovascular;  Laterality: N/A;  . PERCUTANEOUS CORONARY STENT INTERVENTION (PCI-S)  08/09/2014   Procedure: PERCUTANEOUS CORONARY STENT INTERVENTION (PCI-S);  Surgeon: Peter M Martinique, MD;  Location: [facility] CATH LAB;  Service: Cardiovascular;;  . TONSILLECTOMY      No Known Allergies  Outpatient Encounter Medications as of 09/20/2019  Medication Sig  . acetaminophen (TYLENOL) 325 MG tablet Take 650 mg by mouth every 6 (six) hours as needed for mild pain or headache.   . albuterol (VENTOLIN HFA) 108 (90 Base) MCG/ACT inhaler Inhale 1 puff into the lungs every 6 (six) hours as needed for wheezing or shortness of breath.  . Amino Acids-Protein Hydrolys (FEEDING SUPPLEMENT, PRO-STAT SUGAR FREE 64,) LIQD Take 30 mLs by mouth in the morning and at bedtime.  Marland Kitchen atorvastatin (LIPITOR) 40 MG tablet Take 1 tablet (40 mg total) by mouth daily at 6 PM.  . bisacodyl (DULCOLAX) 10 MG suppository Place 10 mg rectally daily as needed (for constipation not relieved by Milk of Magnesia).   . bisoprolol (ZEBETA) 10 MG tablet Take 10 mg by mouth in the morning and at bedtime.  . cephALEXin (KEFLEX) 500 MG capsule Take 1 capsule (500 mg total) by mouth 4 (four) times daily for 4 days.  . Eyelid Cleansers (OCUSOFT LID SCRUB PLUS) PADS Place 1 each into both eyes See admin instructions. CLEANSE BILATERAL EYELASHES ONCE DAILY DX: DRY EYES   . feeding supplement, ENSURE ENLIVE, (ENSURE ENLIVE) LIQD Take 237 mLs by mouth 2 (two) times daily between meals.  . ferrous sulfate 325 (65 FE) MG tablet Take 325 mg by mouth daily with breakfast.   . furosemide (LASIX) 20 MG tablet TAKE 1 TABLET(20 MG) BY MOUTH DAILY  . magnesium hydroxide (MILK OF MAGNESIA) 400 MG/5ML suspension Take 30 mLs by mouth daily as needed for mild constipation.   . Nutritional Supplement LIQD Take 120 mLs by mouth See admin instructions. MedPass; Drink 120 ml's by mouth three times a day  . Nutritional Supplements (NUTRITIONAL SUPPLEMENT PO) Take 1 each by  mouth daily. Magic Cup  . pantoprazole sodium (PROTONIX) 40 mg/20 mL PACK Take 20 mLs (40 mg total) by mouth daily.  . polyvinyl alcohol (ARTIFICIAL TEARS) 1.4 % ophthalmic solution Place 1 drop into both eyes 2 (two) times daily. For dry eyes  . senna (SENOKOT) 8.6 MG TABS tablet Take 2 tablets by mouth at bedtime.  . Sodium Phosphates (RA SALINE ENEMA RE) Place 1 enema rectally daily as needed (for constipation not relieved by Dulcolax suppository and call MD if no relief from enema).   . [DISCONTINUED] Amino Acids-Protein Hydrolys (FEEDING SUPPLEMENT, PRO-STAT SUGAR FREE 64,) LIQD Place 30 mLs into feeding tube 2 (two) times daily.  . [DISCONTINUED] bisoprolol (ZEBETA) 10 MG tablet Place 1 tablet (10 mg total) into feeding tube 2 (two) times daily.  . [DISCONTINUED] NON FORMULARY Take by mouth See admin instructions. Magic Cup: Eat 1 cup once daily to help prevent weight loss  No facility-administered encounter medications on file as of 09/20/2019.    Review of Systems  Unable to obtain due to dementia   Immunization History  Administered Date(s) Administered  . Influenza Whole 03/28/2008  . Influenza,inj,Quad PF,6+ Mos 08/09/2013, 03/30/2015, 03/24/2017  . Influenza-Unspecified 03/31/2019  . Moderna SARS-COVID-2 Vaccination 07/19/2019  . Pneumococcal Conjugate-13 12/08/2014  . Pneumococcal Polysaccharide-23 09/18/2012  . Tdap 02/01/2016, 06/21/2018   Pertinent  Health Maintenance Due  Topic Date Due  . INFLUENZA VACCINE  Completed  . DEXA SCAN  Completed  . PNA vac Low Risk Adult  Completed   Fall Risk  03/19/2018 10/16/2017 09/26/2017 07/14/2017 04/21/2017  Falls in the past year? No Yes No No No  Number falls in past yr: - 1 - - -  Injury with Fall? - Yes - - -  Risk Factor Category  - High Fall Risk - - -  Risk for fall due to : - Impaired mobility;Impaired balance/gait Impaired balance/gait;Impaired mobility - -  Follow up - Falls prevention discussed - - -  Comment - - - -  -     Vitals:   09/20/19 1046  BP: 113/85  Pulse: 74  Resp: 16  Temp: 97.7 F (36.5 C)  TempSrc: Oral  SpO2: 94%  Weight: 112 lb 12.8 oz (51.2 kg)  Height: 4\' 10"  (1.473 m)   Body mass index is 23.58 kg/m.  Physical Exam  GENERAL APPEARANCE: Well nourished. In no acute distress. Normal body habitus SKIN:  Laceration on left orbital area, dry and bruised MOUTH and THROAT: Lips are without lesions. Oral mucosa is moist and without lesions. Tongue is normal in shape, size, and color and without lesions RESPIRATORY: Breathing is even & unlabored, BS CTAB CARDIAC: RRR, no murmur,no extra heart sounds, no edema GI: Abdomen soft, normal BS, no masses, no tenderness NEUROLOGICAL: There is no tremor. Speech is clear. Alert to self, disoriented to time and place. PSYCHIATRIC:  Affect and behavior are appropriate  Labs reviewed: Recent Labs    10/20/18 0433 03/17/19 1811 03/17/19 1922 03/19/19 0621 09/17/19 0320 09/18/19 0449 09/19/19 0814  NA 144   < >  --    < > 143 140 139  K 3.7   < >  --    < > 3.9 3.0* 4.1  CL 111   < >  --    < > 104 103 110  CO2 21*   < >  --    < > 24 23 22   GLUCOSE 97   < >  --    < > 113* 96 122*  BUN 15   < >  --    < > 63* 61* 47*  CREATININE 1.16*   < >  --    < > 2.73* 1.91* 1.33*  CALCIUM 8.5*   < >  --    < > 9.1 8.6* 8.2*  MG 2.0  --  1.7  --   --  1.7  --    < > = values in this interval not displayed.   Recent Labs    03/17/19 1811 09/16/19 2030 09/19/19 0814  AST 34 18 19  ALT 17 11 10   ALKPHOS 60 48 44  BILITOT 2.4* 1.1 0.8  PROT 6.7 6.5 6.1*  ALBUMIN 3.5 2.8* 2.5*   Recent Labs    03/25/19 0346 08/03/19 0000 09/16/19 2030 09/16/19 2030 09/17/19 0320 09/18/19 0449 09/19/19 0814  WBC   < > 4.9 5.6   < >  7.2 4.7 4.1  NEUTROABS  --  3 3.6  --   --   --  2.4  HGB   < > 9.2* 8.9*   < > 9.4* 8.8* 8.4*  HCT   < > 27* 28.6*   < > 30.2* 27.2* 26.6*  MCV   < >  --  99.0   < > 97.4 96.5 97.4  PLT   < > 158 158   < > 169 167  178   < > = values in this interval not displayed.   Lab Results  Component Value Date   TSH 0.923 05/31/2014   Lab Results  Component Value Date   HGBA1C 5.1 03/24/2019   Lab Results  Component Value Date   CHOL 185 03/19/2019   HDL 43 03/19/2019   LDLCALC 113 (H) 03/19/2019   TRIG 146 03/19/2019   CHOLHDL 4.3 03/19/2019    Significant Diagnostic Results in last 30 days:  EEG  Result Date: 09/18/2019 Alexis Goodell, MD     09/18/2019  2:41 PM ELECTROENCEPHALOGRAM REPORT Patient: LAKEEMA CHEESE       Room #: [id number] EEG No. ID: [id number] Age: 86 y.o.        Sex: female Requesting Physician: Heber Fairburn Report Date:  09/18/2019       Interpreting Physician: Alexis Goodell History: CAROLYNA SNODDY is an 86 y.o. female with altered mental status and SAH Medications: Lipitor, Rocephin Conditions of Recording:  This is a 21 channel routine scalp EEG performed with bipolar and monopolar montages arranged in accordance to the international 10/20 system of electrode placement. One channel was dedicated to EKG recording. The patient is in the awake and drowsy states. Description:  Artifact is prominent during the recording often obscuring the background rhythm. When able to be visualized the posterior background rhythm consists of a low voltage, symmetrical, fairly well organized, 8 Hz alpha activity, seen from the parieto-occipital and posterior temporal regions.  The patient drowses briefly with slowing to irregular, low voltage theta and beta activity.  No epileptiform activity is noted.  Hyperventilation and intermittent photic stimulation were not performed. IMPRESSION: This is a technically difficult recording due to the presence of artifact.  The patient does exhibit normal wakefulness and drowse.  No epileptiform activity is noted.  Alexis Goodell, MD Neurology [phone] 09/18/2019, 2:33 PM   CT HEAD WO CONTRAST  Addendum Date: 09/18/2019   ADDENDUM REPORT: 09/18/2019 10:47 ADDENDUM: These  results were called by telephone at the time of interpretation on 09/18/2019 at 10:47 am to provider [facility], who verbally acknowledged these results. Electronically Signed   By: Kellie Simmering DO   On: 09/18/2019 10:47   Result Date: 09/18/2019 CLINICAL DATA:  Subdural hematoma.  Follow-up subdural hematoma. EXAM: CT HEAD WITHOUT CONTRAST TECHNIQUE: Contiguous axial images were obtained from the base of the skull through the vertex without intravenous contrast. COMPARISON:  Head CT 09/17/2019 FINDINGS: Brain: There has been a slight interval increase in size of a right frontal subdural predominantly hypodense collection. This measures up to 12 mm in greatest thickness on the current study (previously 10 mm). Similar degree of mass effect upon the underlying right cerebral hemisphere with 1-2 mm leftward midline shift. Redemonstrated small amount of acute subependymal hemorrhage along the margin of the left lateral ventricle, unchanged (series 3, image 18). New from prior examination there is scattered small volume acute subarachnoid hemorrhage predominance overlying the right cerebral convexity and within the right sylvian fissure. There is also minimal  acute intraventricular hemorrhage layering within the occipital horns of the lateral ventricles. Stable generalized parenchymal atrophy and chronic small vessel ischemic disease. No demarcated cortical infarction is identified. No evidence of hydrocephalus. Vascular: No hyperdense vessel.  Atherosclerotic calcifications. Skull: No significant paranasal sinus disease or mastoid effusion at the imaged levels. IMPRESSION: Slight interval increase in size of a predominantly hypodense right frontal subdural collection. Similar degree of mass effect upon the underlying right cerebral hemisphere unchanged 1-2 mm leftward midline shift. Redemonstrated small focus of acute subependymal hemorrhage along the margin of the left lateral ventricle. New from prior examination, there  is scattered small volume acute subarachnoid hemorrhage predominantly overlying the right cerebral convexity and within the right sylvian fissure. Trace acute intraventricular hemorrhage is also present within the occipital horns. No evidence of hydrocephalus. Electronically Signed: By: Kellie Simmering DO On: 09/18/2019 10:03   CT HEAD WO CONTRAST  Result Date: 09/17/2019 CLINICAL DATA:  Follow-up right-sided subdural. EXAM: CT HEAD WITHOUT CONTRAST TECHNIQUE: Contiguous axial images were obtained from the base of the skull through the vertex without intravenous contrast. COMPARISON:  09/16/2019.  03/21/2019. FINDINGS: Brain: Generalized brain atrophy with chronic small-vessel change as seen previously. Chronic subdural collection on the right. Small amount of acute hemorrhage seen yesterday much less apparent. Maximal thickness is about 6 mm. No evidence of additional or progressive bleeding or accumulation. Right subdural collection exerts only minimal mass effect, with right-to-left midline shift of 1-2 mm. Small amount of sub append mole bleeding in the lateral margin of the left lateral ventricle is also becoming less dense. No true intraventricular component suspected. Vascular: There is atherosclerotic calcification of the major vessels at the base of the brain. Skull: No skull fracture. Sinuses/Orbits: Clear/normal Other: None IMPRESSION: Chronic subdural collection on the right. Small amount of acute bleeding demonstrated yesterday is becoming less apparent. No evidence of any new or additional bleeding. Maximal thickness of the collection is about 6 mm, with only minimal mass effect and 1-2 mm of right-to-left shift. Small amount of subependymal bleeding along the lateral margin of the left lateral ventricle is also becoming less dense and less apparent. Patient had a large hemorrhage in this region in 2020, but that has not recurred to that degree. Electronically Signed   By: Nelson Chimes M.D.   On:  09/17/2019 09:04   CT Head Wo Contrast  Result Date: 09/16/2019 CLINICAL DATA:  Head injury.  Laceration to left eyebrow. EXAM: CT HEAD WITHOUT CONTRAST CT CERVICAL SPINE WITHOUT CONTRAST TECHNIQUE: Multidetector CT imaging of the head and cervical spine was performed following the standard protocol without intravenous contrast. Multiplanar CT image reconstructions of the cervical spine were also generated. COMPARISON:  CT head dated 03/21/2019. FINDINGS: CT HEAD FINDINGS Brain: There is mixed acute and chronic extra-axial hemorrhage along the right frontoparietal convexity. This collection measures up to approximately 6 mm in thickness. There is a curvilinear hyperdense area along the margins of the left lateral ventricle (axial series 3, image 20). This may represent a small volume of acute intraventricular hemorrhage. Atrophy and chronic microvascular ischemic changes are noted. There are old right basal ganglia lacunar infarcts. There is no significant midline shift. No mass effect. Vascular: No hyperdense vessel or unexpected calcification. Skull: There is left frontal scalp swelling without evidence for an underlying fracture. Sinuses/Orbits: No acute finding. Other: None. CT CERVICAL SPINE FINDINGS Alignment: There is straightening and slight reversal of the normal cervical lordotic curvature. Skull base and vertebrae: No acute fracture. No primary  bone lesion or focal pathologic process. Soft tissues and spinal canal: No prevertebral fluid or swelling. No visible canal hematoma. Disc levels: Multilevel disc height loss is noted throughout the cervical spine, which is moderate to severe. Multilevel osseous neural foraminal stenosis is noted. Upper chest: Negative. Other: None IMPRESSION: 1. Mixed acute on chronic right-sided subdural hematoma as detailed above without evidence for significant midline shift or mass effect. 2. Findings concerning for small volume acute intraventricular hemorrhage within the  left lateral ventricle. 3. Advanced atrophy and chronic microvascular ischemic changes are again noted. 4. No acute cervical spine fracture. 5. Left frontal scalp swelling without evidence for an underlying fracture. These results were called by telephone at the time of interpretation on 09/16/2019 at 7:55 pm to provider [facility] , who verbally acknowledged these results. Electronically Signed   By: Constance Holster M.D.   On: 09/16/2019 19:58   CT Cervical Spine Wo Contrast  Result Date: 09/16/2019 CLINICAL DATA:  Head injury.  Laceration to left eyebrow. EXAM: CT HEAD WITHOUT CONTRAST CT CERVICAL SPINE WITHOUT CONTRAST TECHNIQUE: Multidetector CT imaging of the head and cervical spine was performed following the standard protocol without intravenous contrast. Multiplanar CT image reconstructions of the cervical spine were also generated. COMPARISON:  CT head dated 03/21/2019. FINDINGS: CT HEAD FINDINGS Brain: There is mixed acute and chronic extra-axial hemorrhage along the right frontoparietal convexity. This collection measures up to approximately 6 mm in thickness. There is a curvilinear hyperdense area along the margins of the left lateral ventricle (axial series 3, image 20). This may represent a small volume of acute intraventricular hemorrhage. Atrophy and chronic microvascular ischemic changes are noted. There are old right basal ganglia lacunar infarcts. There is no significant midline shift. No mass effect. Vascular: No hyperdense vessel or unexpected calcification. Skull: There is left frontal scalp swelling without evidence for an underlying fracture. Sinuses/Orbits: No acute finding. Other: None. CT CERVICAL SPINE FINDINGS Alignment: There is straightening and slight reversal of the normal cervical lordotic curvature. Skull base and vertebrae: No acute fracture. No primary bone lesion or focal pathologic process. Soft tissues and spinal canal: No prevertebral fluid or swelling. No visible  canal hematoma. Disc levels: Multilevel disc height loss is noted throughout the cervical spine, which is moderate to severe. Multilevel osseous neural foraminal stenosis is noted. Upper chest: Negative. Other: None IMPRESSION: 1. Mixed acute on chronic right-sided subdural hematoma as detailed above without evidence for significant midline shift or mass effect. 2. Findings concerning for small volume acute intraventricular hemorrhage within the left lateral ventricle. 3. Advanced atrophy and chronic microvascular ischemic changes are again noted. 4. No acute cervical spine fracture. 5. Left frontal scalp swelling without evidence for an underlying fracture. These results were called by telephone at the time of interpretation on 09/16/2019 at 7:55 pm to provider [facility] , who verbally acknowledged these results. Electronically Signed   By: Constance Holster M.D.   On: 09/16/2019 19:58   US RENAL  Result Date: 09/18/2019 CLINICAL DATA:  Acute kidney injury. EXAM: RENAL / URINARY TRACT ULTRASOUND COMPLETE COMPARISON:  CT angio chest on 09/17/2016 FINDINGS: Right Kidney: Renal measurements: 9.7 x 3.8 x 4.1 centimeters = volume: 78.2 mL. Renal parenchyma is echogenic. No mass or hydronephrosis. Left Kidney: Renal measurements: 9.4 x 4.2 x 3.7 centimeters = volume: 15.9 mL. Renal parenchyma is echogenic. No hydronephrosis or mass. Bladder: Appears normal for degree of bladder distention. Note is made of debris within the dependent portion of gallbladder. Other:  None. IMPRESSION: 1. Echogenic renal parenchyma, consistent with intrinsic renal disease. 2. No hydronephrosis. 3. Debris within the dependent aspect of the bladder, consistent with urinary stasis. Electronically Signed   By: Nolon Nations M.D.   On: 09/18/2019 17:12    Assessment/Plan  1. SDH (subdural hematoma) (HCC) - repeat head CT showed interval increase in subdural collection from 10 mm to 12 mm with no change in midline shift. Per  neurosurgery, this would not explain her acute encephalopathy. EEG was done without findings concerning for seizure. Remeron was held with concerns for excessive sedation. Per neurosurgery, she does not need follow up.  2. Urinary tract infection without hematuria, site unspecified - was started on Ceftriaxone then switched to Keflex to complete X 5 days  3. Acute renal failure superimposed on stage 3b chronic kidney disease, unspecified acute renal failure type ([place]) Lab Results  Component Value Date   CREATININE 1.33 (H) 09/19/2019   - treated for AKI with IV fluids  4. Protein-calorie malnutrition, severe - had weight loss of 5.58% o4 6.4 lbs in 21 days, 19.85% or 26.8 lbs in 101 days, and 25.58% or 37.2 lbs in 168 days Lab Results  Component Value Date   LABPROT 14.0 09/16/2019   - continue Prostat, magic cup and ensure - discussed weight loss and feeding tube with nephew, Mr. [name]. He is concerned about resident pulling out the her peg tube. Discussed palliative consult. He said that he is going to think about it and wants resident to be comfortable  5. Vascular dementia without behavioral disturbance ([place]) - continue supportive care and fall precautions  6.  Chronic heart failure - she has poor oral intake and AKI on CKD stage 3, will decrease Lasix from 20 mg daily to 20 mg Q MWF -Continue bisoprolol 10 mg twice a day   Family/ staff Communication:  Discussed plan of care with nephew and charge nurse.  Labs/tests ordered:  None  Goals of care:   Long-term care  Durenda Age, DNP, FNP-BC [facility] [phone] (Monday-Friday 8:00 a.m. - 5:00 p.m.) [phone] (after hours)

## 2019-09-20 NOTE — Telephone Encounter (Signed)
CT head was ordered to follow-up on prior hemorrhage with possible restart of aspirin.  Recently in the ED for SDH therefore no indication on repeating CT head at this time.

## 2019-09-20 NOTE — Telephone Encounter (Signed)
Wyn Quaker from [facility] called stating that the pt was just in the ED and had a CT of the head done already and they are wanting to know if she is still needing to get another one done since it had been ordered previously. She would like to know if that same CT can be used. Please advise.

## 2019-09-21 ENCOUNTER — Other Ambulatory Visit: Payer: Medicare Other

## 2019-09-21 ENCOUNTER — Non-Acute Institutional Stay (SKILLED_NURSING_FACILITY): Payer: Medicare Other | Admitting: Internal Medicine

## 2019-09-21 ENCOUNTER — Encounter: Payer: Self-pay | Admitting: Internal Medicine

## 2019-09-21 DIAGNOSIS — I10 Essential (primary) hypertension: Secondary | ICD-10-CM

## 2019-09-21 DIAGNOSIS — I714 Abdominal aortic aneurysm, without rupture, unspecified: Secondary | ICD-10-CM

## 2019-09-21 DIAGNOSIS — S065X9A Traumatic subdural hemorrhage with loss of consciousness of unspecified duration, initial encounter: Secondary | ICD-10-CM | POA: Diagnosis not present

## 2019-09-21 DIAGNOSIS — S065XAA Traumatic subdural hemorrhage with loss of consciousness status unknown, initial encounter: Secondary | ICD-10-CM

## 2019-09-21 NOTE — Progress Notes (Signed)
   NURSING HOME LOCATION:  [facility] ROOM NUMBER: [id number]   CODE STATUS:  DNR  PCP:  Hendricks Limes MD.  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  This was complicated by acute encephalopathy.  She did develop AMS beyond her baseline findings of dementia.  Third CT revealed slight interval increase in the subdural collection with no change in the midline shift.  Neurosurgery did not feel this would cause her encephalopathic findings.  Remeron was stopped and mental status returned to baseline of dementia.  Additionally UA suggested UTI.  Cultures were pending at discharge.  At discharge she was to continue Keflex for 3 additional days starting 3/21. The final culture was reviewed today.  It revealed only 50,000 colonies of E. coli.  E. coli was sensitive to Keflex.   Review of systems:  Could not be completed due to nonverbal state.   Physical exam:  Pertinent or positive findings: She does not open her eyes or interact with the examiner.  Hair is thin and wispy especially over the crown.  She has bilateral ectropion of the lower lids, right greater than left.  There is lateral malar and periorbital ecchymoses on the left.  Right temple reveals faint hyperpigmentation.  Eyebrows are thin laterally.  Her mouth is downward curving.  Rate is regular.  Breath sounds are decreased with minor rales.  Extremities are limp falling to the bed when lifted.  She has fusiform changes of the knees.  There is marked gastrocnemius wasting.  She has flexion contractures of the toes.  Toenails are deformed and discolored.  Pedal pulses are decreased.  She does have irregular splotchy hyperpigmentation over the ankles, left greater than right.  General appearance: no acute distress, increased work of breathing is present.   Lymphatic: No  lymphadenopathy about the head, neck, axilla. Eyes: No conjunctival inflammation or lid edema is present. There is no scleral icterus. Ears:  External ear exam shows no significant lesions or deformities.   Nose:  External nasal examination shows no deformity or inflammation. Nasal mucosa are pink and moist without lesions, exudates Neck:  No thyromegaly, masses, tenderness noted.    Heart:  No murmur, click, rub.  Lungs:  without wheezes, rhonchi,  rubs. Abdomen: Bowel sounds are normal.  Abdomen is soft and nontender with no organomegaly, hernias, masses. GU: Deferred  Extremities:  No cyanosis, clubbing, edema. Neurologic exam:  Balance, Rhomberg, finger to nose testing could not be completed due to clinical state Skin: Warm & dry w/o tenting. No significant  rash.  See clinical summary under each active problem in the Problem List with associated updated therapeutic plan

## 2019-09-21 NOTE — Assessment & Plan Note (Addendum)
Antihypertensives were held on admission as blood pressures remained 110-130 during the hospitalization.  These were again held at discharge except for bisoprolol.  Specifically stopped were amlodipine 10 mg and losartan 100 mg. Today blood pressure is 129/63 ; low-dose ARB will be reinitiated if blood pressure is consistently over [id number] systolic or 90 diastolic as there is a history of CNS bleed and aortic aneurysm.

## 2019-09-21 NOTE — Assessment & Plan Note (Signed)
She remains nonverbal and did not follow commands for me; NP student was able to document her dementia.  She cannot give the date beyond "Saturday" and could not name the president.

## 2019-09-21 NOTE — Patient Instructions (Signed)
See assessment and plan under each diagnosis in the problem list and acutely for this visit 

## 2019-09-21 NOTE — Assessment & Plan Note (Addendum)
Aneurysm is not palpable on abdominal exam.  Focus is adequate blood pressure control. Further imaging will not be pursued because of her advanced age, serious comorbidities and CNS issues.

## 2019-09-22 ENCOUNTER — Ambulatory Visit: Payer: Medicare Other | Admitting: Adult Health

## 2019-09-22 ENCOUNTER — Other Ambulatory Visit: Payer: Self-pay

## 2019-09-22 ENCOUNTER — Non-Acute Institutional Stay (SKILLED_NURSING_FACILITY): Payer: Medicare Other | Admitting: Adult Health

## 2019-09-22 ENCOUNTER — Encounter: Payer: Self-pay | Admitting: Adult Health

## 2019-09-22 DIAGNOSIS — Z7189 Other specified counseling: Secondary | ICD-10-CM

## 2019-09-22 DIAGNOSIS — F0150 Vascular dementia without behavioral disturbance: Secondary | ICD-10-CM | POA: Diagnosis not present

## 2019-09-22 DIAGNOSIS — S065X9A Traumatic subdural hemorrhage with loss of consciousness of unspecified duration, initial encounter: Secondary | ICD-10-CM

## 2019-09-22 DIAGNOSIS — I5032 Chronic diastolic (congestive) heart failure: Secondary | ICD-10-CM | POA: Diagnosis not present

## 2019-09-22 DIAGNOSIS — I10 Essential (primary) hypertension: Secondary | ICD-10-CM

## 2019-09-22 DIAGNOSIS — E43 Unspecified severe protein-calorie malnutrition: Secondary | ICD-10-CM | POA: Diagnosis not present

## 2019-09-22 DIAGNOSIS — N390 Urinary tract infection, site not specified: Secondary | ICD-10-CM | POA: Diagnosis not present

## 2019-09-22 DIAGNOSIS — S065XAA Traumatic subdural hemorrhage with loss of consciousness status unknown, initial encounter: Secondary | ICD-10-CM

## 2019-09-22 NOTE — Progress Notes (Addendum)
Location:  [facility] Room Number: [id number] Place of Service:  SNF (31) Provider:  Durenda Age, DNP, FNP-BC  Patient Care Team: Hendricks Limes, MD as PCP - General (Internal Medicine) Medina-Vargas, Senaida Lange, NP as Nurse Practitioner (Internal Medicine)  Extended Emergency Contact Information Primary Emergency Contact: Jane [name] Mobile Phone: [phone] Relation: Nephew Secondary Emergency Contact: Jane [name] Mobile Phone: [phone] Relation: Relative  Code Status:  DNR  Goals of care: Advanced Directive information Advanced Directives 09/22/2019  Does Patient Have a Medical Advance Directive? Yes  Type of Advance Directive Out of facility DNR (pink MOST or yellow form)  Does patient want to make changes to medical advance directive? No - Patient declined  Copy of [place] in Chart? -  Would patient like information on creating a medical advance directive? -  Pre-existing out of facility DNR order (yellow form or pink MOST form) Yellow form placed in chart (order not valid for inpatient use)     Chief Complaint  Patient presents with  . Acute Visit    Patient is seen for an advance care plan meeting.    HPI:  Pt is an 86 y.o. female who had advance care planning today attended by Mr. Jane [name], nephew, via teleconference, NP, social worker, MDS coordinator and Glass blower/designer. She remains to be DNR. It was discussed that no peg tube placement nor palliative care consult will be done. Nephew is afraid that resident will attempt to pull her peg tube due to confusion/agitation. Patient has been having weight loss due to poor appetite. She is currently on pro-stat, Magic cup and med pass for supplementation. She was recently re-admitted to [facility] post fall with laceration on her left orbital area and subdural hematoma.. She was found to have UTI and was started on Ceftriaxone then  transitioned to Keflex. She continues to be on Keflex with no adverse reactions reported.  Discussed medications, vital signs and weights. She is a long-term care resident of [facility]. She has PMH of aortic insufficiency, CAD, arthritis, CHF, CKD, GERD, hyperlipidemia, hypertension and stroke. The meeting lasted for 30 minutes.   Past Medical History:  Diagnosis Date  . Anemia   . Aortic insufficiency    mild  . Arthritis    "arms" (08/09/2015)  . CAD (coronary artery disease)    cath 08/09/2014 95% stenosis in prox to mid RCA s/p DES, 80-90% prox OM2, 50% distal LAD  . CHF (congestive heart failure) ([place])   . CKD (chronic kidney disease) stage 3, GFR 30-59 ml/min   . Colon polyp    2009 colonoscopy, not retrieved for pathology  . Dyspnea   . Ectropion of left lower eyelid   . GERD (gastroesophageal reflux disease) 2009   EGD with benign gastric polyp too  . Hyperlipidemia   . Hypertension   . Pneumonia    history of  . Stroke ([place]) 1975  . Vitamin D deficiency    Past Surgical History:  Procedure Laterality Date  . ABDOMINAL HYSTERECTOMY    . APPENDECTOMY    . ARTERY BIOPSY Left 12/16/2012   Procedure: BIOPSY TEMPORAL ARTERY;  Surgeon: Mal Misty, MD;  Location: [facility];  Service: Vascular;  Laterality: Left;  . CARDIAC CATHETERIZATION    . LEFT HEART CATHETERIZATION WITH CORONARY ANGIOGRAM N/A 08/09/2014   Procedure: LEFT HEART CATHETERIZATION WITH CORONARY ANGIOGRAM;  Surgeon: Peter M Martinique, MD;  Location: [facility] CATH LAB;  Service: Cardiovascular;  Laterality:  N/A;  . PERCUTANEOUS CORONARY STENT INTERVENTION (PCI-S)  08/09/2014   Procedure: PERCUTANEOUS CORONARY STENT INTERVENTION (PCI-S);  Surgeon: Peter M Martinique, MD;  Location: [facility] CATH LAB;  Service: Cardiovascular;;  . TONSILLECTOMY      Allergies  Allergen Reactions  . Remeron [Mirtazapine]     Worsening of baseline AMS with frank encephalopathy    Outpatient Encounter Medications as of 09/22/2019  Medication Sig  .  acetaminophen (TYLENOL) 325 MG tablet Take 650 mg by mouth every 6 (six) hours as needed for mild pain or headache.   . albuterol (VENTOLIN HFA) 108 (90 Base) MCG/ACT inhaler Inhale 1 puff into the lungs every 6 (six) hours as needed for wheezing or shortness of breath.  . Amino Acids-Protein Hydrolys (FEEDING SUPPLEMENT, PRO-STAT SUGAR FREE 64,) LIQD Take 30 mLs by mouth in the morning and at bedtime.  Marland Kitchen atorvastatin (LIPITOR) 40 MG tablet Take 1 tablet (40 mg total) by mouth daily at 6 PM.  . bisacodyl (DULCOLAX) 10 MG suppository Place 10 mg rectally daily as needed (for constipation not relieved by Milk of Magnesia).   . bisoprolol (ZEBETA) 10 MG tablet Take 10 mg by mouth in the morning and at bedtime.  . [EXPIRED] cephALEXin (KEFLEX) 500 MG capsule Take 500 mg by mouth 4 (four) times daily.  . Eyelid Cleansers (OCUSOFT LID SCRUB PLUS) PADS Place 1 each into both eyes See admin instructions. CLEANSE BILATERAL EYELASHES ONCE DAILY DX: DRY EYES   . feeding supplement, ENSURE ENLIVE, (ENSURE ENLIVE) LIQD Take 237 mLs by mouth 2 (two) times daily between meals.  . ferrous sulfate 325 (65 FE) MG tablet Take 325 mg by mouth daily with breakfast.   . furosemide (LASIX) 20 MG tablet Take 20 mg by mouth every Monday, Wednesday, and Friday.  . magnesium hydroxide (MILK OF MAGNESIA) 400 MG/5ML suspension Take 30 mLs by mouth daily as needed for mild constipation.   . Nutritional Supplement LIQD Take 120 mLs by mouth See admin instructions. MedPass; Drink 120 ml's by mouth three times a day  . Nutritional Supplements (NUTRITIONAL SUPPLEMENT PO) Take 1 each by mouth daily. Magic Cup  . pantoprazole sodium (PROTONIX) 40 mg/20 mL PACK Take 20 mLs (40 mg total) by mouth daily.  . polyvinyl alcohol (ARTIFICIAL TEARS) 1.4 % ophthalmic solution Place 1 drop into both eyes 2 (two) times daily. For dry eyes  . senna (SENOKOT) 8.6 MG TABS tablet Take 2 tablets by mouth at bedtime.  . Sodium Phosphates (RA SALINE  ENEMA RE) Place 1 enema rectally daily as needed (for constipation not relieved by Dulcolax suppository and call MD if no relief from enema).    No facility-administered encounter medications on file as of 09/22/2019.    Review of Systems  Unable to obtain due to dementia    Immunization History  Administered Date(s) Administered  . Influenza Whole 03/28/2008  . Influenza,inj,Quad PF,6+ Mos 08/09/2013, 03/30/2015, 03/24/2017  . Influenza-Unspecified 03/31/2019  . Moderna SARS-COVID-2 Vaccination 07/19/2019  . Pneumococcal Conjugate-13 12/08/2014  . Pneumococcal Polysaccharide-23 09/18/2012  . Tdap 02/01/2016, 06/21/2018   Pertinent  Health Maintenance Due  Topic Date Due  . INFLUENZA VACCINE  Completed  . DEXA SCAN  Completed  . PNA vac Low Risk Adult  Completed   Fall Risk  03/19/2018 10/16/2017 09/26/2017 07/14/2017 04/21/2017  Falls in the past year? No Yes No No No  Number falls in past yr: - 1 - - -  Injury with Fall? - Yes - - -  Risk  Factor Category  - High Fall Risk - - -  Risk for fall due to : - Impaired mobility;Impaired balance/gait Impaired balance/gait;Impaired mobility - -  Follow up - Falls prevention discussed - - -  Comment - - - - -     Vitals:   09/22/19 0813  BP: 129/63  Pulse: 82  Resp: 16  Temp: 99.3 F (37.4 C)  TempSrc: Oral  SpO2: 94%  Weight: 112 lb 12.8 oz (51.2 kg)  Height: 4\' 10"  (1.473 m)   Body mass index is 23.58 kg/m.  Physical Exam  GENERAL APPEARANCE:  In no acute distress. Normal body habitus SKIN:  Left orbital laceration with sutures, Left orbital area bruising MOUTH and THROAT: Lips are without lesions. Oral mucosa is moist and without lesions. Tongue is normal in shape, size, and color and without lesions RESPIRATORY: Breathing is even & unlabored, BS CTAB CARDIAC: RRR, no murmur,no extra heart sounds, no edema GI: Abdomen soft, normal BS, no masses, no tenderness NEUROLOGICAL: There is no tremor. Speech is clear. Alert to  self, disoriented to time and place. PSYCHIATRIC:  Affect and behavior are appropriate  Labs reviewed: Recent Labs    10/20/18 0433 03/17/19 1811 03/17/19 1922 03/19/19 0621 09/17/19 0320 09/18/19 0449 09/19/19 0814  NA 144   < >  --    < > 143 140 139  K 3.7   < >  --    < > 3.9 3.0* 4.1  CL 111   < >  --    < > 104 103 110  CO2 21*   < >  --    < > 24 23 22   GLUCOSE 97   < >  --    < > 113* 96 122*  BUN 15   < >  --    < > 63* 61* 47*  CREATININE 1.16*   < >  --    < > 2.73* 1.91* 1.33*  CALCIUM 8.5*   < >  --    < > 9.1 8.6* 8.2*  MG 2.0  --  1.7  --   --  1.7  --    < > = values in this interval not displayed.   Recent Labs    03/17/19 1811 09/16/19 2030 09/19/19 0814  AST 34 18 19  ALT 17 11 10   ALKPHOS 60 48 44  BILITOT 2.4* 1.1 0.8  PROT 6.7 6.5 6.1*  ALBUMIN 3.5 2.8* 2.5*   Recent Labs    03/25/19 0346 08/03/19 0000 09/16/19 2030 09/16/19 2030 09/17/19 0320 09/18/19 0449 09/19/19 0814  WBC   < > 4.9 5.6   < > 7.2 4.7 4.1  NEUTROABS  --  3 3.6  --   --   --  2.4  HGB   < > 9.2* 8.9*   < > 9.4* 8.8* 8.4*  HCT   < > 27* 28.6*   < > 30.2* 27.2* 26.6*  MCV   < >  --  99.0   < > 97.4 96.5 97.4  PLT   < > 158 158   < > 169 167 178   < > = values in this interval not displayed.   Lab Results  Component Value Date   TSH 0.923 05/31/2014   Lab Results  Component Value Date   HGBA1C 5.1 03/24/2019   Lab Results  Component Value Date   CHOL 185 03/19/2019   HDL 43 03/19/2019   LDLCALC 113 (H) 03/19/2019  TRIG 146 03/19/2019   CHOLHDL 4.3 03/19/2019    Significant Diagnostic Results in last 30 days:  EEG  Result Date: 09/18/2019 Alexis Goodell, MD     09/18/2019  2:41 PM ELECTROENCEPHALOGRAM REPORT Patient: VIVAN ALIOTTA       Room #: [id number] EEG No. ID: [id number] Age: 86 y.o.        Sex: female Requesting Physician: Heber Waterville Report Date:  09/18/2019       Interpreting Physician: Alexis Goodell History: MARYJOY SUGGETT is an 86 y.o. female with  altered mental status and SAH Medications: Lipitor, Rocephin Conditions of Recording:  This is a 21 channel routine scalp EEG performed with bipolar and monopolar montages arranged in accordance to the international 10/20 system of electrode placement. One channel was dedicated to EKG recording. The patient is in the awake and drowsy states. Description:  Artifact is prominent during the recording often obscuring the background rhythm. When able to be visualized the posterior background rhythm consists of a low voltage, symmetrical, fairly well organized, 8 Hz alpha activity, seen from the parieto-occipital and posterior temporal regions.  The patient drowses briefly with slowing to irregular, low voltage theta and beta activity.  No epileptiform activity is noted.  Hyperventilation and intermittent photic stimulation were not performed. IMPRESSION: This is a technically difficult recording due to the presence of artifact.  The patient does exhibit normal wakefulness and drowse.  No epileptiform activity is noted.  Alexis Goodell, MD Neurology [phone] 09/18/2019, 2:33 PM   CT HEAD WO CONTRAST  Addendum Date: 09/18/2019   ADDENDUM REPORT: 09/18/2019 10:47 ADDENDUM: These results were called by telephone at the time of interpretation on 09/18/2019 at 10:47 am to provider [facility], who verbally acknowledged these results. Electronically Signed   By: Kellie Simmering DO   On: 09/18/2019 10:47   Result Date: 09/18/2019 CLINICAL DATA:  Subdural hematoma.  Follow-up subdural hematoma. EXAM: CT HEAD WITHOUT CONTRAST TECHNIQUE: Contiguous axial images were obtained from the base of the skull through the vertex without intravenous contrast. COMPARISON:  Head CT 09/17/2019 FINDINGS: Brain: There has been a slight interval increase in size of a right frontal subdural predominantly hypodense collection. This measures up to 12 mm in greatest thickness on the current study (previously 10 mm). Similar degree of mass effect upon  the underlying right cerebral hemisphere with 1-2 mm leftward midline shift. Redemonstrated small amount of acute subependymal hemorrhage along the margin of the left lateral ventricle, unchanged (series 3, image 18). New from prior examination there is scattered small volume acute subarachnoid hemorrhage predominance overlying the right cerebral convexity and within the right sylvian fissure. There is also minimal acute intraventricular hemorrhage layering within the occipital horns of the lateral ventricles. Stable generalized parenchymal atrophy and chronic small vessel ischemic disease. No demarcated cortical infarction is identified. No evidence of hydrocephalus. Vascular: No hyperdense vessel.  Atherosclerotic calcifications. Skull: No significant paranasal sinus disease or mastoid effusion at the imaged levels. IMPRESSION: Slight interval increase in size of a predominantly hypodense right frontal subdural collection. Similar degree of mass effect upon the underlying right cerebral hemisphere unchanged 1-2 mm leftward midline shift. Redemonstrated small focus of acute subependymal hemorrhage along the margin of the left lateral ventricle. New from prior examination, there is scattered small volume acute subarachnoid hemorrhage predominantly overlying the right cerebral convexity and within the right sylvian fissure. Trace acute intraventricular hemorrhage is also present within the occipital horns. No evidence of hydrocephalus. Electronically Signed: By: Kellie Simmering DO On:  09/18/2019 10:03   CT HEAD WO CONTRAST  Result Date: 09/17/2019 CLINICAL DATA:  Follow-up right-sided subdural. EXAM: CT HEAD WITHOUT CONTRAST TECHNIQUE: Contiguous axial images were obtained from the base of the skull through the vertex without intravenous contrast. COMPARISON:  09/16/2019.  03/21/2019. FINDINGS: Brain: Generalized brain atrophy with chronic small-vessel change as seen previously. Chronic subdural collection on the  right. Small amount of acute hemorrhage seen yesterday much less apparent. Maximal thickness is about 6 mm. No evidence of additional or progressive bleeding or accumulation. Right subdural collection exerts only minimal mass effect, with right-to-left midline shift of 1-2 mm. Small amount of sub append mole bleeding in the lateral margin of the left lateral ventricle is also becoming less dense. No true intraventricular component suspected. Vascular: There is atherosclerotic calcification of the major vessels at the base of the brain. Skull: No skull fracture. Sinuses/Orbits: Clear/normal Other: None IMPRESSION: Chronic subdural collection on the right. Small amount of acute bleeding demonstrated yesterday is becoming less apparent. No evidence of any new or additional bleeding. Maximal thickness of the collection is about 6 mm, with only minimal mass effect and 1-2 mm of right-to-left shift. Small amount of subependymal bleeding along the lateral margin of the left lateral ventricle is also becoming less dense and less apparent. Patient had a large hemorrhage in this region in 2020, but that has not recurred to that degree. Electronically Signed   By: Nelson Chimes M.D.   On: 09/17/2019 09:04   CT Head Wo Contrast  Result Date: 09/16/2019 CLINICAL DATA:  Head injury.  Laceration to left eyebrow. EXAM: CT HEAD WITHOUT CONTRAST CT CERVICAL SPINE WITHOUT CONTRAST TECHNIQUE: Multidetector CT imaging of the head and cervical spine was performed following the standard protocol without intravenous contrast. Multiplanar CT image reconstructions of the cervical spine were also generated. COMPARISON:  CT head dated 03/21/2019. FINDINGS: CT HEAD FINDINGS Brain: There is mixed acute and chronic extra-axial hemorrhage along the right frontoparietal convexity. This collection measures up to approximately 6 mm in thickness. There is a curvilinear hyperdense area along the margins of the left lateral ventricle (axial series 3,  image 20). This may represent a small volume of acute intraventricular hemorrhage. Atrophy and chronic microvascular ischemic changes are noted. There are old right basal ganglia lacunar infarcts. There is no significant midline shift. No mass effect. Vascular: No hyperdense vessel or unexpected calcification. Skull: There is left frontal scalp swelling without evidence for an underlying fracture. Sinuses/Orbits: No acute finding. Other: None. CT CERVICAL SPINE FINDINGS Alignment: There is straightening and slight reversal of the normal cervical lordotic curvature. Skull base and vertebrae: No acute fracture. No primary bone lesion or focal pathologic process. Soft tissues and spinal canal: No prevertebral fluid or swelling. No visible canal hematoma. Disc levels: Multilevel disc height loss is noted throughout the cervical spine, which is moderate to severe. Multilevel osseous neural foraminal stenosis is noted. Upper chest: Negative. Other: None IMPRESSION: 1. Mixed acute on chronic right-sided subdural hematoma as detailed above without evidence for significant midline shift or mass effect. 2. Findings concerning for small volume acute intraventricular hemorrhage within the left lateral ventricle. 3. Advanced atrophy and chronic microvascular ischemic changes are again noted. 4. No acute cervical spine fracture. 5. Left frontal scalp swelling without evidence for an underlying fracture. These results were called by telephone at the time of interpretation on 09/16/2019 at 7:55 pm to provider [facility] , who verbally acknowledged these results. Electronically Signed   By: Harrell Gave  Green M.D.   On: 09/16/2019 19:58   CT Cervical Spine Wo Contrast  Result Date: 09/16/2019 CLINICAL DATA:  Head injury.  Laceration to left eyebrow. EXAM: CT HEAD WITHOUT CONTRAST CT CERVICAL SPINE WITHOUT CONTRAST TECHNIQUE: Multidetector CT imaging of the head and cervical spine was performed following the standard protocol  without intravenous contrast. Multiplanar CT image reconstructions of the cervical spine were also generated. COMPARISON:  CT head dated 03/21/2019. FINDINGS: CT HEAD FINDINGS Brain: There is mixed acute and chronic extra-axial hemorrhage along the right frontoparietal convexity. This collection measures up to approximately 6 mm in thickness. There is a curvilinear hyperdense area along the margins of the left lateral ventricle (axial series 3, image 20). This may represent a small volume of acute intraventricular hemorrhage. Atrophy and chronic microvascular ischemic changes are noted. There are old right basal ganglia lacunar infarcts. There is no significant midline shift. No mass effect. Vascular: No hyperdense vessel or unexpected calcification. Skull: There is left frontal scalp swelling without evidence for an underlying fracture. Sinuses/Orbits: No acute finding. Other: None. CT CERVICAL SPINE FINDINGS Alignment: There is straightening and slight reversal of the normal cervical lordotic curvature. Skull base and vertebrae: No acute fracture. No primary bone lesion or focal pathologic process. Soft tissues and spinal canal: No prevertebral fluid or swelling. No visible canal hematoma. Disc levels: Multilevel disc height loss is noted throughout the cervical spine, which is moderate to severe. Multilevel osseous neural foraminal stenosis is noted. Upper chest: Negative. Other: None IMPRESSION: 1. Mixed acute on chronic right-sided subdural hematoma as detailed above without evidence for significant midline shift or mass effect. 2. Findings concerning for small volume acute intraventricular hemorrhage within the left lateral ventricle. 3. Advanced atrophy and chronic microvascular ischemic changes are again noted. 4. No acute cervical spine fracture. 5. Left frontal scalp swelling without evidence for an underlying fracture. These results were called by telephone at the time of interpretation on 09/16/2019 at  7:55 pm to provider [facility] , who verbally acknowledged these results. Electronically Signed   By: Constance Holster M.D.   On: 09/16/2019 19:58   US RENAL  Result Date: 09/18/2019 CLINICAL DATA:  Acute kidney injury. EXAM: RENAL / URINARY TRACT ULTRASOUND COMPLETE COMPARISON:  CT angio chest on 09/17/2016 FINDINGS: Right Kidney: Renal measurements: 9.7 x 3.8 x 4.1 centimeters = volume: 78.2 mL. Renal parenchyma is echogenic. No mass or hydronephrosis. Left Kidney: Renal measurements: 9.4 x 4.2 x 3.7 centimeters = volume: 15.9 mL. Renal parenchyma is echogenic. No hydronephrosis or mass. Bladder: Appears normal for degree of bladder distention. Note is made of debris within the dependent portion of gallbladder. Other: None. IMPRESSION: 1. Echogenic renal parenchyma, consistent with intrinsic renal disease. 2. No hydronephrosis. 3. Debris within the dependent aspect of the bladder, consistent with urinary stasis. Electronically Signed   By: Nolon Nations M.D.   On: 09/18/2019 17:12    Assessment/Plan  1. Advance care planning - remains to be DNR  - No peg tube nor palliative care for now -discussed medications, vital signs and weigts  2. SDH (subdural hematoma) (HCC) -Repeat head CT showed interval increase in subdural collection, no change in midline shift -Per neurosurgery, she does not need follow-up  3. Urinary tract infection without hematuria, site unspecified -Continue cephalexin -No adverse reactions reported  4. Chronic heart failure with preserved ejection fraction (HCC) - no SOB, continue Lasix and bisoprolol  5. Protein-calorie malnutrition, severe - no peg tube nor palliative care consult -Continue, Magic  cup and Ensure for supplementation  6. Essential hypertension -Stable, continue bisoprolol  7. Vascular dementia without behavioral disturbance ([place]) -Continue supportive care and fall precautions     Family/ staff Communication:  Discussed plan of care  with nephew and IDT.  Labs/tests ordered:  None  Goals of care:   Long-term care  Durenda Age, DNP, FNP-BC [facility] [phone] (Monday-Friday 8:00 a.m. - 5:00 p.m.) [phone] (after hours)

## 2019-09-22 NOTE — Progress Notes (Deleted)
[facility] [address] [place]. [place] [address] [phone]        FOLLOW UP NOTE  [name] Date of Birth:  04/27/1929 Medical Record Number:  [id number]   Reason for Referral:  stroke follow up    CHIEF COMPLAINT:  No chief complaint on file.   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  She unfortunately experienced a fall on 09/16/2019 with subsequent right subdural hematoma with acute encephalopathy in setting of UTI.  She was discharged back to [facility].  3/18 CT head>mixed acute on chronic right sided subdural hematoma without evidence of significant midline shift or mass effect; findings concerning for small volume acute intraventricular hemorrhage within the left lateral ventricle 3/19 CT head> bleeding becoming less apparent. No evidence of new bleeding. Minimal mass effect with 1-25mm of right to left shift.   History Provided for reference purposes only Stroke admission 03/17/2019: Jane [name] is a 86 y.o. female with history of HTN, HLD, previous stroke, CAD, CHF, CKD  presented on 03/17/2019 after being found down with severe HTN, L gaze deviation, not following commands.  Stroke work-up showed left frontal small periventricular hemorrhage with extensive IVH likely secondary to uncontrolled hypertension.  CT head showed large IVH with ventricular dilation, 9 mm left frontal lobe, signs of edema and hematoma along vertex without evidence of fracture.  CTA head/neck no underlying vascular lesion for left hemorrhage but did show arthrosclerosis bilateral ICA bifurcations 60%, 70% or more right V4 and 50 to 70% left V4.  MRI stable IVH with increased ICH with hemorrhage originating from left periventricular white matter, stable ventriculomegaly without midline shift and no evidence of infarct with skull hematoma.  Repeat  CT head stable.  2D echo normal EF without cardiac source of emboli.  History of HTN found in hypertensive emergency on admission with BP 234/127 stabilized with Cleviprex and resumed Lasix, isosorbide, losartan and increased dose of zebeta to 10 mg twice daily and added amlodipine 10 mg daily.  LDL 113 and recommended continuation of atorvastatin 40 mg daily.  No evidence or history of DM with A1c 5.2.  Hospital course complicated with E. coli bacteremia with fever treated with antibiotics.  Monitoring of AKI on CKD stage III.  Evidence of thrombocytopenia with monitoring of CBC.  Other stroke risk factors include advanced age, former tobacco use, history of stroke/TIA and CAD.  Other active problems include initial use of c-collar due to fall and removed after negative imaging, hypokalemia and history of temporal arteritis in 2014.  Residual deficits cognitive impairment generalized weakness and dysphagia and was discharged to SNF.  Initial visit 06/09/2019: Jane [name] is a 86 year old female who is being seen today for hospital follow-up accompanied by facility secretary who assisted with transportation.  She continues to reside at [facility].  Limited exam and history taking due to underlying dementia.  Currently sitting in wheelchair as she is nonambulatory.  No evidence of residual weakness but does have difficulty lifting legs with slowed movement.  Continues on atorvastatin without myalgias.  Blood pressure today 102/70.  She continues to be seen routinely by facility providers.  No further concerns at this time.       ROS:   14 system review of systems performed and negative with exception of memory loss and confusion  PMH:  Past Medical History:  Diagnosis Date  .  Anemia   . Aortic insufficiency    mild  . Arthritis    "arms" (08/09/2015)  . CAD (coronary artery disease)    cath 08/09/2014 95% stenosis in prox to mid RCA s/p DES, 80-90% prox OM2, 50% distal LAD  . CHF (congestive heart  failure) ([place])   . CKD (chronic kidney disease) stage 3, GFR 30-59 ml/min   . Colon polyp    2009 colonoscopy, not retrieved for pathology  . Dyspnea   . Ectropion of left lower eyelid   . GERD (gastroesophageal reflux disease) 2009   EGD with benign gastric polyp too  . Hyperlipidemia   . Hypertension   . Pneumonia    history of  . Stroke ([place]) 1975  . Vitamin D deficiency     PSH:  Past Surgical History:  Procedure Laterality Date  . ABDOMINAL HYSTERECTOMY    . APPENDECTOMY    . ARTERY BIOPSY Left 12/16/2012   Procedure: BIOPSY TEMPORAL ARTERY;  Surgeon: Mal Misty, MD;  Location: [place];  Service: Vascular;  Laterality: Left;  . CARDIAC CATHETERIZATION    . LEFT HEART CATHETERIZATION WITH CORONARY ANGIOGRAM N/A 08/09/2014   Procedure: LEFT HEART CATHETERIZATION WITH CORONARY ANGIOGRAM;  Surgeon: Peter M Martinique, MD;  [facility] LAB;  Service: Cardiovascular;  Laterality: N/A;  . PERCUTANEOUS CORONARY STENT INTERVENTION (PCI-S)  08/09/2014   Procedure: PERCUTANEOUS CORONARY STENT INTERVENTION (PCI-S);  Surgeon: Peter M Martinique, MD;  Location: [facility] CATH LAB;  Service: Cardiovascular;;  . TONSILLECTOMY      Social History:  Social History   Socioeconomic History  . Marital status: Widowed    Spouse name: Not on file  . Number of children: Not on file  . Years of education: Not on file  . Highest education level: Not on file  Occupational History  . Not on file  Tobacco Use  . Smoking status: Former Smoker    Types: Cigarettes    Quit date: 07/02/1979    Years since quitting: 40.2  . Smokeless tobacco: Former Systems developer  . Tobacco comment: Started in teenage years - Quit 1980  Substance and Sexual Activity  . Alcohol use: No    Alcohol/week: 0.0 standard drinks    Comment: Quit alcohol 1975-76  . Drug use: No  . Sexual activity: Never  Other Topics Concern  . Not on file  Social History Narrative   Ms. Reppucci lives alone in an apartment in [place]. Her nephew,  and her son and his wife live nearby and visit and help often. She is independent in ADLs but depends on family for IADLs. She is ambulatory in her apartment without a walker but requires this when she leaves her apartment.    Social Determinants of Health   Financial Resource Strain:   . Difficulty of Paying Living Expenses:   Food Insecurity:   . Worried About Charity fundraiser in the Last Year:   . Arboriculturist in the Last Year:   Transportation Needs:   . Film/video editor (Medical):   Marland Kitchen Lack of Transportation (Non-Medical):   Physical Activity:   . Days of Exercise per Week:   . Minutes of Exercise per Session:   Stress:   . Feeling of Stress :   Social Connections:   . Frequency of Communication with Friends and Family:   . Frequency of Social Gatherings with Friends and Family:   . Attends Religious Services:   . Active Member of Clubs or Organizations:   .  Attends Archivist Meetings:   Marland Kitchen Marital Status:   Intimate Partner Violence:   . Fear of Current or Ex-Partner:   . Emotionally Abused:   Marland Kitchen Physically Abused:   . Sexually Abused:     Family History:  Family History  Problem Relation Age of Onset  . Stroke Mother   . Hypertension Mother   . Stroke Father   . Hypertension Father   . Diabetes Sister     Medications:   Current Outpatient Medications on File Prior to Visit  Medication Sig Dispense Refill  . acetaminophen (TYLENOL) 325 MG tablet Take 650 mg by mouth every 6 (six) hours as needed for mild pain or headache.     . albuterol (VENTOLIN HFA) 108 (90 Base) MCG/ACT inhaler Inhale 1 puff into the lungs every 6 (six) hours as needed for wheezing or shortness of breath.    . Amino Acids-Protein Hydrolys (FEEDING SUPPLEMENT, PRO-STAT SUGAR FREE 64,) LIQD Take 30 mLs by mouth in the morning and at bedtime.    Marland Kitchen atorvastatin (LIPITOR) 40 MG tablet Take 1 tablet (40 mg total) by mouth daily at 6 PM. 30 tablet 0  . bisacodyl (DULCOLAX) 10 MG  suppository Place 10 mg rectally daily as needed (for constipation not relieved by Milk of Magnesia).     . bisoprolol (ZEBETA) 10 MG tablet Take 10 mg by mouth in the morning and at bedtime.    . Eyelid Cleansers (OCUSOFT LID SCRUB PLUS) PADS Place 1 each into both eyes See admin instructions. CLEANSE BILATERAL EYELASHES ONCE DAILY DX: DRY EYES     . feeding supplement, ENSURE ENLIVE, (ENSURE ENLIVE) LIQD Take 237 mLs by mouth 2 (two) times daily between meals. 237 mL 12  . ferrous sulfate 325 (65 FE) MG tablet Take 325 mg by mouth daily with breakfast.     . furosemide (LASIX) 20 MG tablet Take 20 mg by mouth every Monday, Wednesday, and Friday.    . magnesium hydroxide (MILK OF MAGNESIA) 400 MG/5ML suspension Take 30 mLs by mouth daily as needed for mild constipation.     . Nutritional Supplement LIQD Take 120 mLs by mouth See admin instructions. MedPass; Drink 120 ml's by mouth three times a day    . Nutritional Supplements (NUTRITIONAL SUPPLEMENT PO) Take 1 each by mouth daily. Magic Cup    . pantoprazole sodium (PROTONIX) 40 mg/20 mL PACK Take 20 mLs (40 mg total) by mouth daily. 30 mL 0  . polyvinyl alcohol (ARTIFICIAL TEARS) 1.4 % ophthalmic solution Place 1 drop into both eyes 2 (two) times daily. For dry eyes 15 mL 0  . senna (SENOKOT) 8.6 MG TABS tablet Take 2 tablets by mouth at bedtime.    . Sodium Phosphates (RA SALINE ENEMA RE) Place 1 enema rectally daily as needed (for constipation not relieved by Dulcolax suppository and call MD if no relief from enema).      No current facility-administered medications on file prior to visit.    Allergies:   Allergies  Allergen Reactions  . Remeron [Mirtazapine]     Worsening of baseline AMS with frank encephalopathy     Physical Exam  There were no vitals filed for this visit. There is no height or weight on file to calculate BMI. No exam data present   General: Frail pleasant elderly African-American female, seated, in no evident  distress Head: head normocephalic and atraumatic.   Neck: supple with no carotid or supraclavicular bruits Cardiovascular: regular rate and rhythm,  no murmurs Musculoskeletal: no deformity Skin:  no rash/petichiae Vascular:  Normal pulses all extremities   Neurologic Exam Mental Status: Appears fatigued but easily arousable.   Slurred speech but unsure if baseline.  Slowed/delayed mentation and response.  Disoriented to place, time and age but oriented to name. Recent and remote memory impaired. Attention span, concentration and fund of knowledge impaired. Mood and affect appropriate and cooperative with exam.  Cranial Nerves: Fundoscopic exam deferred due to difficulty visualizing with chronic eye conditions.  Bilateral lower eyelid ectropion with mild bilateral eye opening apraxia. Extraocular movements full without nystagmus.  Blinking to visual threat bilaterally.  Hearing impaired. Facial sensation intact. Face, tongue, palate moves normally and symmetrically.  Motor: Normal bulk and tone.  Generalized weakness 4/5 able to resist moderately against examiner but slower movements with bilateral lower extremity.  Possible truncal weakness leaning towards left side Sensory.: intact to touch , pinprick , position and vibratory sensation.  Coordination: Rapid alternating movements delayed. Finger-to-nose performed accurately bilaterally and heel-to-shin difficulty performing likely due to weakness and cognition. Gait and Station: Deferred as patient nonambulatory Reflexes: 1+ and symmetric. Toes downgoing.     NIHSS  6 Modified Rankin  4    Diagnostic Data (Labs, Imaging, Testing)  Ct Head Wo Contrast 03/17/2019   1. Large intraventricular hemorrhage with ventricular dilatation. 2. 9 millimeter intraparenchymal hemorrhage within the frontal lobe periventricular white matter, adjacent to the body of the LEFT LATERAL ventricle. 3. Atrophy and small vessel disease. 4. Significant scalp edema  and hematoma along the vertex, not associated with underlying fracture.   CT Head WO Contrast 03/21/19 IMPRESSION: 1. Unchanged appearance of large volume intraventricular and small volume extra-axial subarachnoid hemorrhage. No new hemorrhagic site. 2. Slight decrease in size of the lateral ventricles compared to the prior study. 3. Large scalp vertex hematoma.  Ct Cervical Spine Wo Contrast 03/17/2019   Significant stable degenerative changes throughout the cervical spine. No evidence for acute abnormality.   Ct Angio Head W Or Wo Contrast Ct Angio Neck W Or Wo Contrast 03/18/2019 No evidence of underlying vascular lesion in the region of the deep brain hemorrhage on the left. Advanced aortic atherosclerosis. Atherosclerotic disease at both carotid bifurcations with maximal stenosis of 60% on each side. 30-50% stenosis at both vertebral artery origins. 50% or less stenosis in both carotid siphon regions. 70% or greater stenosis of the right vertebral artery V4 segment. 50-70% stenosis of the left vertebral artery V4 segment.   Mr Brain Wo Contrast 03/19/2019 1. Stable large volume of intraventricular hemorrhage, although superimposed subarachnoid hemorrhage volume appears increased from the CT on 03/17/2019 suggesting a degree of continued bleeding.  2. Stable ventriculomegaly, with mild transependymal edema. No significant midline shift. Basilar cisterns remain patent.  3. As before, the hemorrhage might have originated in the left periventricular white matter. No superimposed acute infarct. No underlying lesion is identified in the absence of IV contrast.  4. Scalp hematoma at the vertex re-identified.    2D Echocardiogram 1. Left ventricular ejection fraction, by visual estimation, is 60 to 65%. The left ventricle has normal function. Normal left ventricular size. Left ventricular septal wall thickness was mildly increased. Mildly increased left ventricular posterior wall thickness.  There is mildly increased left ventricular hypertrophy. 2. Elevated left ventricular end-diastolic pressure. 3. Left ventricular diastolic Doppler parameters are consistent with pseudonormalization pattern of LV diastolic filling. 4. Global right ventricle has normal systolic function.The right ventricular size is normal. No increase in right ventricular wall  thickness. 5. Left atrial size was normal. 6. Right atrial size was normal. 7. The mitral valve is normal in structure. Trace mitral valve regurgitation. No evidence of mitral stenosis. 8. The tricuspid valve is normal in structure. Tricuspid valve regurgitation is trivial. 9. The aortic valve The aortic valve is normal in structure. Aortic valve regurgitation is mild by color flow Doppler. Structurally normal aortic valve, with no evidence of sclerosis or stenosis. 10. The pulmonic valve was normal in structure. Pulmonic valve regurgitation is not visualized by color flow Doppler. 11. Mildly elevated pulmonary artery systolic pressure. 12. The inferior vena cava is normal in size with greater than 50% respiratory variability, suggesting right atrial pressure of 3 mmHg.  Dg Chest Port 1 View 03/17/2019 Cardiomegaly with mild interstitial edema.   DG Chest - Portable 1 View 03/20/2019 1. Largely resolved vascular congestion and interstitial edema.    ASSESSMENT: Jane [name] is a 86 y.o. year old female presented with after being found down with severe HTN, left gaze deviation and not following commands on 03/17/2019 with stroke work-up revealing left frontal small periventricular hemorrhage with extensive IVH likely secondary to uncontrolled HTN. Vascular risk factors include HTN, HLD, prior stroke, CAD, CHF and CKD.  Continues to reside at [facility] and difficulty with full history taking and exam due to underlying dementia.  Compared to assessment during hospitalization, overall improvement of weakness with  generalized weakness appearing more related to age, deconditioning and cognition    PLAN:  1. Left frontal hemorrhage: Previously on aspirin with history of CAD post stent but discontinued due to hemorrhage.  Recommend repeating CT head to assess for resolution and possible restart of aspirin 81 mg daily for secondary stroke prevention as blood pressure greatly improved.  Continue Lipitor for secondary stroke prevention. Maintain strict control of hypertension with blood pressure goal below 130/90, diabetes with hemoglobin A1c goal below 6.5% and cholesterol with LDL cholesterol (bad cholesterol) goal below 70 mg/dL.  I also advised the patient to eat a healthy diet with plenty of whole grains, cereals, fruits and vegetables, exercise regularly with at least 30 minutes of continuous activity daily and maintain ideal body weight.  Recommend continuation of working with PT for possible ongoing improvement of generalized weakness and to avoid further deconditioning 2. HTN: Advised to continue current treatment regimen.  Today's BP 102/70.  Advised to continue to monitor at home along with continued follow-up with PCP for management 3. HLD: Advised to continue current treatment regimen along with continued follow-up with PCP for future prescribing and monitoring of lipid panel 4. Dementia: Likely underlying dementia and unable to determine if worsening post stroke.  Continues to be monitored by facility providers    Follow up in 3 months or call earlier if needed   Greater than 50% of time during this 45 minute visit was spent on counseling, explanation of diagnosis of left frontal hemorrhage, reviewing risk factor management of HTN and HLD, planning of further management along with potential future management, and discussion with patient answering all questions    Frann Rider, [facility]  [facility] [address] [place], [place] [address]  Phone [phone]  Fax [phone] Note: This document was prepared with digital dictation and possible smart phrase technology. Any transcriptional errors that result from this process are unintentional.

## 2019-10-04 ENCOUNTER — Other Ambulatory Visit: Payer: Self-pay | Admitting: Adult Health

## 2019-10-04 MED ORDER — LORAZEPAM 0.5 MG PO TABS: 0.5000 mg | ORAL_TABLET | Freq: Every day | ORAL | 0 refills | Status: DC | PRN

## 2019-10-22 ENCOUNTER — Non-Acute Institutional Stay (SKILLED_NURSING_FACILITY): Payer: Medicare Other | Admitting: Adult Health

## 2019-10-22 ENCOUNTER — Encounter: Payer: Self-pay | Admitting: Adult Health

## 2019-10-22 DIAGNOSIS — D649 Anemia, unspecified: Secondary | ICD-10-CM | POA: Diagnosis not present

## 2019-10-22 DIAGNOSIS — I5032 Chronic diastolic (congestive) heart failure: Secondary | ICD-10-CM | POA: Diagnosis not present

## 2019-10-22 DIAGNOSIS — B370 Candidal stomatitis: Secondary | ICD-10-CM

## 2019-10-22 DIAGNOSIS — I10 Essential (primary) hypertension: Secondary | ICD-10-CM | POA: Diagnosis not present

## 2019-10-22 DIAGNOSIS — F0150 Vascular dementia without behavioral disturbance: Secondary | ICD-10-CM

## 2019-10-22 NOTE — Progress Notes (Signed)
Location:  [facility] Room Number: 126-A Place of Service:  SNF (31) Provider:  Durenda Age, DNP, FNP-BC  Patient Care Team: Hendricks Limes, MD as PCP - General (Internal Medicine) Medina-Vargas, Senaida Lange, NP as Nurse Practitioner (Internal Medicine)  Extended Emergency Contact Information Primary Emergency Contact: [name] Mobile Phone: [phone] Relation: Nephew Secondary Emergency Contact: Ernie Avena Mobile Phone: [phone] Relation: Relative  Code Status:   DNR  Goals of care: Advanced Directive information Advanced Directives 09/22/2019  Does Patient Have a Medical Advance Directive? Yes  Type of Advance Directive Out of facility DNR (pink MOST or yellow form)  Does patient want to make changes to medical advance directive? No - Patient declined  Copy of [place] in Chart? -  Would patient like information on creating a medical advance directive? -  Pre-existing out of facility DNR order (yellow form or pink MOST form) Yellow form placed in chart (order not valid for inpatient use)     Chief Complaint  Patient presents with  . Medical Management of Chronic Issues    Routine [facility] visit    HPI:  Pt is a 86 y.o. female seen today for medical management of chronic diseases. She has PMH of CHF, CKD stage III, hypertension, hyperlipidemia, aortic insufficiency and CAD. She was seen in the room today. She is verbally responsive. She likes sandwiches for her meals. She denies having any pain. Noted to have whitish coating on her tongue. BIMS score 5/15, done on 10/08/19. She is currently having PT, OT and ST. No reported SOB. She takes Lasix for CHF. Latest weight 112.9 lbs, stable.    Past Medical History:  Diagnosis Date  . Anemia   . Aortic insufficiency    mild  . Arthritis    "arms" (08/09/2015)  . CAD (coronary artery disease)    cath 08/09/2014 95% stenosis in prox to mid RCA s/p DES, 80-90%  prox OM2, 50% distal LAD  . CHF (congestive heart failure) ([place])   . CKD (chronic kidney disease) stage 3, GFR 30-59 ml/min   . Colon polyp    2009 colonoscopy, not retrieved for pathology  . Dyspnea   . Ectropion of left lower eyelid   . GERD (gastroesophageal reflux disease) 2009   EGD with benign gastric polyp too  . Hyperlipidemia   . Hypertension   . Pneumonia    history of  . Stroke ([place]) 1975  . Vitamin D deficiency    Past Surgical History:  Procedure Laterality Date  . ABDOMINAL HYSTERECTOMY    . APPENDECTOMY    . ARTERY BIOPSY Left 12/16/2012   Procedure: BIOPSY TEMPORAL ARTERY;  Surgeon: Mal Misty, MD;  Location: [facility];  Service: Vascular;  Laterality: Left;  . CARDIAC CATHETERIZATION    . LEFT HEART CATHETERIZATION WITH CORONARY ANGIOGRAM N/A 08/09/2014   Procedure: LEFT HEART CATHETERIZATION WITH CORONARY ANGIOGRAM;  Surgeon: Peter M Martinique, MD;  Location: [facility] CATH LAB;  Service: Cardiovascular;  Laterality: N/A;  . PERCUTANEOUS CORONARY STENT INTERVENTION (PCI-S)  08/09/2014   Procedure: PERCUTANEOUS CORONARY STENT INTERVENTION (PCI-S);  Surgeon: Peter M Martinique, MD;  Location: [facility] CATH LAB;  Service: Cardiovascular;;  . TONSILLECTOMY      Allergies  Allergen Reactions  . Remeron [Mirtazapine]     Worsening of baseline AMS with frank encephalopathy    Outpatient Encounter Medications as of 10/22/2019  Medication Sig  . acetaminophen (TYLENOL) 325 MG tablet Take 650 mg by mouth every  6 (six) hours as needed for mild pain or headache.   . albuterol (VENTOLIN HFA) 108 (90 Base) MCG/ACT inhaler Inhale 1 puff into the lungs every 6 (six) hours as needed for wheezing or shortness of breath.  . Amino Acids-Protein Hydrolys (FEEDING SUPPLEMENT, PRO-STAT SUGAR FREE 64,) LIQD Take 30 mLs by mouth in the morning and at bedtime.  Marland Kitchen atorvastatin (LIPITOR) 40 MG tablet Take 1 tablet (40 mg total) by mouth daily at 6 PM.  . bisacodyl (DULCOLAX) 10 MG suppository Place 10 mg  rectally daily as needed (for constipation not relieved by Milk of Magnesia).   . bisoprolol (ZEBETA) 10 MG tablet Take 10 mg by mouth in the morning and at bedtime.  . Eyelid Cleansers (OCUSOFT LID SCRUB PLUS) PADS Place 1 each into both eyes See admin instructions. CLEANSE BILATERAL EYELASHES ONCE DAILY DX: DRY EYES   . feeding supplement, ENSURE ENLIVE, (ENSURE ENLIVE) LIQD Take 237 mLs by mouth 2 (two) times daily between meals.  . furosemide (LASIX) 20 MG tablet Take 20 mg by mouth every Monday, Wednesday, and Friday.  . magnesium hydroxide (MILK OF MAGNESIA) 400 MG/5ML suspension Take 30 mLs by mouth daily as needed for mild constipation.   . Nutritional Supplement LIQD Take 120 mLs by mouth See admin instructions. MedPass; Drink 120 ml's by mouth three times a day  . Nutritional Supplements (NUTRITIONAL SUPPLEMENT PO) Take 1 each by mouth daily. Magic Cup  . pantoprazole sodium (PROTONIX) 40 mg/20 mL PACK Take 20 mLs (40 mg total) by mouth daily.  . polyvinyl alcohol (ARTIFICIAL TEARS) 1.4 % ophthalmic solution Place 1 drop into both eyes 2 (two) times daily. For dry eyes  . senna (SENOKOT) 8.6 MG TABS tablet Take 2 tablets by mouth at bedtime.  . Sodium Phosphates (RA SALINE ENEMA RE) Place 1 enema rectally daily as needed (for constipation not relieved by Dulcolax suppository and call MD if no relief from enema).   . [DISCONTINUED] LORazepam (ATIVAN) 0.5 MG tablet Take 1 tablet (0.5 mg total) by mouth daily as needed for anxiety.   No facility-administered encounter medications on file as of 10/22/2019.    Review of Systems unable to obtain due to dementia   Immunization History  Administered Date(s) Administered  . Influenza Whole 03/28/2008  . Influenza,inj,Quad PF,6+ Mos 08/09/2013, 03/30/2015, 03/24/2017  . Influenza-Unspecified 03/31/2019  . Moderna SARS-COVID-2 Vaccination 07/19/2019  . Pneumococcal Conjugate-13 12/08/2014  . Pneumococcal Polysaccharide-23 09/18/2012  . Tdap  02/01/2016, 06/21/2018   Pertinent  Health Maintenance Due  Topic Date Due  . INFLUENZA VACCINE  01/30/2020  . DEXA SCAN  Completed  . PNA vac Low Risk Adult  Completed   Fall Risk  03/19/2018 10/16/2017 09/26/2017 07/14/2017 04/21/2017  Falls in the past year? No Yes No No No  Number falls in past yr: - 1 - - -  Injury with Fall? - Yes - - -  Risk Factor Category  - High Fall Risk - - -  Risk for fall due to : - Impaired mobility;Impaired balance/gait Impaired balance/gait;Impaired mobility - -  Follow up - Falls prevention discussed - - -  Comment - - - - -     Vitals:   10/22/19 1205  BP: (!) 122/54  Pulse: 62  Resp: 18  Temp: 97.9 F (36.6 C)  TempSrc: Oral  SpO2: 100%  Weight: 112 lb 14.4 oz (51.2 kg)  Height: 4\' 10"  (1.473 m)   Body mass index is 23.6 kg/m.  Physical Exam  GENERAL APPEARANCE:  In no acute distress. Normal body habitus SKIN:  Skin is warm and dry.  MOUTH and THROAT: Lips are without lesions. Oral mucosa is moist and without lesions. Tongue has whitish coating RESPIRATORY: Breathing is even & unlabored, BS CTAB CARDIAC: RRR, no murmur,no extra heart sounds, no edema GI: Abdomen soft, normal BS, no masses, no tenderness EXTREMITIES:  Able to move X 4 extremities NEUROLOGICAL: There is no tremor. Speech is clear. Alert to self, disoriented to time and place. PSYCHIATRIC:  Affect and behavior are appropriate  Labs reviewed: Recent Labs    03/17/19 1922 03/19/19 0621 09/17/19 0320 09/18/19 0449 09/19/19 0814  NA  --    < > 143 140 139  K  --    < > 3.9 3.0* 4.1  CL  --    < > 104 103 110  CO2  --    < > 24 23 22   GLUCOSE  --    < > 113* 96 122*  BUN  --    < > 63* 61* 47*  CREATININE  --    < > 2.73* 1.91* 1.33*  CALCIUM  --    < > 9.1 8.6* 8.2*  MG 1.7  --   --  1.7  --    < > = values in this interval not displayed.   Recent Labs    03/17/19 1811 09/16/19 2030 09/19/19 0814  AST 34 18 19  ALT 17 11 10   ALKPHOS 60 48 44  BILITOT  2.4* 1.1 0.8  PROT 6.7 6.5 6.1*  ALBUMIN 3.5 2.8* 2.5*   Recent Labs    03/25/19 0346 08/03/19 0000 09/16/19 2030 09/16/19 2030 09/17/19 0320 09/18/19 0449 09/19/19 0814  WBC   < > 4.9 5.6   < > 7.2 4.7 4.1  NEUTROABS  --  3 3.6  --   --   --  2.4  HGB   < > 9.2* 8.9*   < > 9.4* 8.8* 8.4*  HCT   < > 27* 28.6*   < > 30.2* 27.2* 26.6*  MCV   < >  --  99.0   < > 97.4 96.5 97.4  PLT   < > 158 158   < > 169 167 178   < > = values in this interval not displayed.   Lab Results  Component Value Date   TSH 0.923 05/31/2014   Lab Results  Component Value Date   HGBA1C 5.1 03/24/2019   Lab Results  Component Value Date   CHOL 185 03/19/2019   HDL 43 03/19/2019   LDLCALC 113 (H) 03/19/2019   TRIG 146 03/19/2019   CHOLHDL 4.3 03/19/2019     Assessment/Plan  1. Oral candida - has whitish coating on her tongue, will start nystatin 100,000 unit/mL suspension swab 5 mL to tongue 4 times daily x2 weeks -Oral care daily  2. Chronic heart failure with preserved ejection fraction (HCC) -No SOB, continue Lasix 20 mg 1 tab MWF  3. Essential hypertension -Controlled, continue bisoprolol 10 mg 1 tab twice a day  4. Chronic anemia Lab Results  Component Value Date   HGB 8.4 (L) 09/19/2019   - will check for Ferritin, iron level, TIBC, vitamin B12 and folate  5. Vascular dementia without behavioral disturbance ([place]) -Continue supportive care, fall precautions   Family/ staff Communication: Discussed plan of care with resident and charge nurse.  Labs/tests ordered: Ferritin, iron level, TIBC, vitamin B12 and folate  Goals of  care: Long-term care   Durenda Age, DNP, FNP-BC [facility] [phone] (Monday-Friday 8:00 a.m. - 5:00 p.m.) [phone] (after hours)

## 2019-10-25 DIAGNOSIS — I10 Essential (primary) hypertension: Secondary | ICD-10-CM | POA: Diagnosis not present

## 2019-10-25 DIAGNOSIS — D649 Anemia, unspecified: Secondary | ICD-10-CM | POA: Diagnosis not present

## 2019-10-25 DIAGNOSIS — Z79899 Other long term (current) drug therapy: Secondary | ICD-10-CM | POA: Diagnosis not present

## 2019-10-25 DIAGNOSIS — I5032 Chronic diastolic (congestive) heart failure: Secondary | ICD-10-CM | POA: Diagnosis not present

## 2019-10-25 DIAGNOSIS — D519 Vitamin B12 deficiency anemia, unspecified: Secondary | ICD-10-CM | POA: Diagnosis not present

## 2019-10-25 LAB — IRON,TIBC AND FERRITIN PANEL
%SAT: 24.66
Ferritin: 322.5
Iron: 31
TIBC: 125
UIBC: 94

## 2019-10-25 LAB — VITAMIN B12: Vitamin B-12: 150

## 2019-10-27 ENCOUNTER — Non-Acute Institutional Stay (SKILLED_NURSING_FACILITY): Payer: Medicare Other | Admitting: Adult Health

## 2019-10-27 ENCOUNTER — Encounter: Payer: Self-pay | Admitting: Adult Health

## 2019-10-27 DIAGNOSIS — D508 Other iron deficiency anemias: Secondary | ICD-10-CM | POA: Diagnosis not present

## 2019-10-27 DIAGNOSIS — E538 Deficiency of other specified B group vitamins: Secondary | ICD-10-CM

## 2019-10-27 DIAGNOSIS — G4700 Insomnia, unspecified: Secondary | ICD-10-CM | POA: Diagnosis not present

## 2019-10-27 DIAGNOSIS — F0390 Unspecified dementia without behavioral disturbance: Secondary | ICD-10-CM | POA: Diagnosis not present

## 2019-10-27 DIAGNOSIS — F419 Anxiety disorder, unspecified: Secondary | ICD-10-CM | POA: Diagnosis not present

## 2019-10-27 NOTE — Progress Notes (Signed)
Location:  [facility] Room Number: 126-A Place of Service:  SNF (31) Provider:  Durenda Age, DNP, FNP-BC  Patient Care Team: Hendricks Limes, MD as PCP - General (Internal Medicine) Medina-Vargas, Senaida Lange, NP as Nurse Practitioner (Internal Medicine)  Extended Emergency Contact Information Primary Emergency Contact: [name] Mobile Phone: [phone] Relation: Nephew Secondary Emergency Contact: Ernie Avena Mobile Phone: [phone] Relation: Relative  Code Status:  DNR  Goals of care: Advanced Directive information Advanced Directives 09/22/2019  Does Patient Have a Medical Advance Directive? Yes  Type of Advance Directive Out of facility DNR (pink MOST or yellow form)  Does patient want to make changes to medical advance directive? No - Patient declined  Copy of [place] in Chart? -  Would patient like information on creating a medical advance directive? -  Pre-existing out of facility DNR order (yellow form or pink MOST form) Yellow form placed in chart (order not valid for inpatient use)     Chief Complaint  Patient presents with  . Acute Visit    Patient seen for low B12 and iron levels.     HPI:  Pt is an 86 y.o. female seen today for low B12 and iron levels.  She is a long-term resident of [facility].  She has a PMH of congestive heart failure, chronic kidney disease stage III, hypertension, hyperlipidemia, aortic insufficiency and CAD.  Lab results showed B12 <150, iron 31 (low), TIBC 125 (low), ferritin 322.50 (normal and folate acid 12.4 (normal). She has poor oral intake. Nephew does not want resident to have PEG tube placement for now. She has DNR code status.   Past Medical History:  Diagnosis Date  . Anemia   . Aortic insufficiency    mild  . Arthritis    "arms" (08/09/2015)  . CAD (coronary artery disease)    cath 08/09/2014 95% stenosis in prox to mid RCA s/p DES,  80-90% prox OM2, 50% distal LAD  . CHF (congestive heart failure) ([place])   . CKD (chronic kidney disease) stage 3, GFR 30-59 ml/min   . Colon polyp    2009 colonoscopy, not retrieved for pathology  . Dyspnea   . Ectropion of left lower eyelid   . GERD (gastroesophageal reflux disease) 2009   EGD with benign gastric polyp too  . Hyperlipidemia   . Hypertension   . Pneumonia    history of  . Stroke ([place]) 1975  . Vitamin D deficiency    Past Surgical History:  Procedure Laterality Date  . ABDOMINAL HYSTERECTOMY    . APPENDECTOMY    . ARTERY BIOPSY Left 12/16/2012   Procedure: BIOPSY TEMPORAL ARTERY;  Surgeon: Mal Misty, MD;  Location: [facility];  Service: Vascular;  Laterality: Left;  . CARDIAC CATHETERIZATION    . LEFT HEART CATHETERIZATION WITH CORONARY ANGIOGRAM N/A 08/09/2014   Procedure: LEFT HEART CATHETERIZATION WITH CORONARY ANGIOGRAM;  Surgeon: Peter M Martinique, MD;  Location: [facility] CATH LAB;  Service: Cardiovascular;  Laterality: N/A;  . PERCUTANEOUS CORONARY STENT INTERVENTION (PCI-S)  08/09/2014   Procedure: PERCUTANEOUS CORONARY STENT INTERVENTION (PCI-S);  Surgeon: Peter M Martinique, MD;  Location: [facility] CATH LAB;  Service: Cardiovascular;;  . TONSILLECTOMY      Allergies  Allergen Reactions  . Remeron [Mirtazapine]     Worsening of baseline AMS with frank encephalopathy    Outpatient Encounter Medications as of 10/27/2019  Medication Sig  . acetaminophen (TYLENOL) 325 MG tablet Take 650 mg by  mouth every 6 (six) hours as needed for mild pain or headache.   . albuterol (VENTOLIN HFA) 108 (90 Base) MCG/ACT inhaler Inhale 1 puff into the lungs every 6 (six) hours as needed for wheezing or shortness of breath.  . Amino Acids-Protein Hydrolys (FEEDING SUPPLEMENT, PRO-STAT SUGAR FREE 64,) LIQD Take 30 mLs by mouth in the morning and at bedtime.  Marland Kitchen atorvastatin (LIPITOR) 40 MG tablet Take 1 tablet (40 mg total) by mouth daily at 6 PM.  . bisacodyl (DULCOLAX) 10 MG suppository Place 10  mg rectally daily as needed (for constipation not relieved by Milk of Magnesia).   . bisoprolol (ZEBETA) 10 MG tablet Take 10 mg by mouth in the morning and at bedtime.  . Eyelid Cleansers (OCUSOFT LID SCRUB PLUS) PADS Place 1 each into both eyes See admin instructions. CLEANSE BILATERAL EYELASHES ONCE DAILY DX: DRY EYES   . feeding supplement, ENSURE ENLIVE, (ENSURE ENLIVE) LIQD Take 237 mLs by mouth 2 (two) times daily between meals.  . furosemide (LASIX) 20 MG tablet Take 20 mg by mouth every Monday, Wednesday, and Friday.  . magnesium hydroxide (MILK OF MAGNESIA) 400 MG/5ML suspension Take 30 mLs by mouth daily as needed for mild constipation.   . Nutritional Supplement LIQD Take 120 mLs by mouth in the morning, at noon, and at bedtime. MedPass  . Nutritional Supplements (NUTRITIONAL SUPPLEMENT PO) Take 1 each by mouth daily. Magic Cup  . nystatin (MYCOSTATIN) 100000 UNIT/ML suspension Take 5 mLs by mouth 4 (four) times daily.  . pantoprazole sodium (PROTONIX) 40 mg/20 mL PACK Take 20 mLs (40 mg total) by mouth daily.  . polyvinyl alcohol (ARTIFICIAL TEARS) 1.4 % ophthalmic solution Place 1 drop into both eyes 2 (two) times daily. For dry eyes  . senna (SENOKOT) 8.6 MG TABS tablet Take 2 tablets by mouth at bedtime.  . Sodium Phosphates (RA SALINE ENEMA RE) Place 1 enema rectally daily as needed (for constipation not relieved by Dulcolax suppository and call MD if no relief from enema).    No facility-administered encounter medications on file as of 10/27/2019.    Review of Systems   unable to obtain due to dementia    Immunization History  Administered Date(s) Administered  . Influenza Whole 03/28/2008  . Influenza,inj,Quad PF,6+ Mos 08/09/2013, 03/30/2015, 03/24/2017  . Influenza-Unspecified 03/31/2019  . Moderna SARS-COVID-2 Vaccination 07/19/2019  . Pneumococcal Conjugate-13 12/08/2014  . Pneumococcal Polysaccharide-23 09/18/2012  . Tdap 02/01/2016, 06/21/2018   Pertinent   Health Maintenance Due  Topic Date Due  . INFLUENZA VACCINE  01/30/2020  . DEXA SCAN  Completed  . PNA vac Low Risk Adult  Completed   Fall Risk  03/19/2018 10/16/2017 09/26/2017 07/14/2017 04/21/2017  Falls in the past year? No Yes No No No  Number falls in past yr: - 1 - - -  Injury with Fall? - Yes - - -  Risk Factor Category  - High Fall Risk - - -  Risk for fall due to : - Impaired mobility;Impaired balance/gait Impaired balance/gait;Impaired mobility - -  Follow up - Falls prevention discussed - - -  Comment - - - - -     Vitals:   10/27/19 1529  BP: 110/72  Pulse: 86  Resp: 18  Temp: (!) 97.3 F (36.3 C)  TempSrc: Oral  SpO2: 100%  Weight: 112 lb 14.4 oz (51.2 kg)  Height: 4\' 10"  (1.473 m)   Body mass index is 23.6 kg/m.  Physical Exam  GENERAL APPEARANCE:  In  no acute distress. Normal body habitus SKIN:  Skin is warm and dry.  MOUTH and THROAT: Lips are without lesions. Oral mucosa is moist and without lesions. Tongue with whitish coating  RESPIRATORY: Breathing is even & unlabored, BS CTAB CARDIAC: RRR, no murmur,no extra heart sounds, no edema GI: Abdomen soft, normal BS, no masses, no tenderness EXTREMITIES:  Able to move X 4 extremities NEUROLOGICAL: There is no tremor. Speech is clear. Alert to self, disoriented to time and place. PSYCHIATRIC:  Affect and behavior are appropriate  Labs reviewed: Recent Labs    03/17/19 1922 03/19/19 0621 09/17/19 0320 09/18/19 0449 09/19/19 0814  NA  --    < > 143 140 139  K  --    < > 3.9 3.0* 4.1  CL  --    < > 104 103 110  CO2  --    < > 24 23 22   GLUCOSE  --    < > 113* 96 122*  BUN  --    < > 63* 61* 47*  CREATININE  --    < > 2.73* 1.91* 1.33*  CALCIUM  --    < > 9.1 8.6* 8.2*  MG 1.7  --   --  1.7  --    < > = values in this interval not displayed.   Recent Labs    03/17/19 1811 09/16/19 2030 09/19/19 0814  AST 34 18 19  ALT 17 11 10   ALKPHOS 60 48 44  BILITOT 2.4* 1.1 0.8  PROT 6.7 6.5 6.1*    ALBUMIN 3.5 2.8* 2.5*   Recent Labs    03/25/19 0346 08/03/19 0000 09/16/19 2030 09/16/19 2030 09/17/19 0320 09/18/19 0449 09/19/19 0814  WBC   < > 4.9 5.6   < > 7.2 4.7 4.1  NEUTROABS  --  3 3.6  --   --   --  2.4  HGB   < > 9.2* 8.9*   < > 9.4* 8.8* 8.4*  HCT   < > 27* 28.6*   < > 30.2* 27.2* 26.6*  MCV   < >  --  99.0   < > 97.4 96.5 97.4  PLT   < > 158 158   < > 169 167 178   < > = values in this interval not displayed.   Lab Results  Component Value Date   TSH 0.923 05/31/2014   Lab Results  Component Value Date   HGBA1C 5.1 03/24/2019   Lab Results  Component Value Date   CHOL 185 03/19/2019   HDL 43 03/19/2019   LDLCALC 113 (H) 03/19/2019   TRIG 146 03/19/2019   CHOLHDL 4.3 03/19/2019    Assessment/Plan  1. Iron deficiency anemia secondary to inadequate dietary iron intake Lab Results  Component Value Date   HGB 8.4 (L) 09/19/2019   -Iron level 31 (low), will start ferrous sulfate 325 mg 1 tab daily  2. Vitamin B12 deficiency Lab Results  Component Value Date   VITAMINB12 150 10/25/2019   - will start on vitamin B12 1000 mcg IM weekly x4 then monthly    Family/ staff Communication: Discussed plan of care with resident and charge nurse.  Labs/tests ordered: CBC in 1 month  Goals of care:   Long-term care   Durenda Age, DNP, FNP-BC [facility] [phone] (Monday-Friday 8:00 a.m. - 5:00 p.m.) [phone] (after hours)

## 2019-11-01 DIAGNOSIS — J069 Acute upper respiratory infection, unspecified: Secondary | ICD-10-CM | POA: Diagnosis not present

## 2019-11-02 DIAGNOSIS — M79674 Pain in right toe(s): Secondary | ICD-10-CM | POA: Diagnosis not present

## 2019-11-02 DIAGNOSIS — M79675 Pain in left toe(s): Secondary | ICD-10-CM | POA: Diagnosis not present

## 2019-11-02 DIAGNOSIS — B351 Tinea unguium: Secondary | ICD-10-CM | POA: Diagnosis not present

## 2019-11-02 DIAGNOSIS — I739 Peripheral vascular disease, unspecified: Secondary | ICD-10-CM | POA: Diagnosis not present

## 2019-11-04 ENCOUNTER — Encounter: Payer: Self-pay | Admitting: *Deleted

## 2019-11-17 ENCOUNTER — Non-Acute Institutional Stay (SKILLED_NURSING_FACILITY): Payer: Medicare Other | Admitting: Internal Medicine

## 2019-11-17 ENCOUNTER — Encounter: Payer: Self-pay | Admitting: Internal Medicine

## 2019-11-17 DIAGNOSIS — E43 Unspecified severe protein-calorie malnutrition: Secondary | ICD-10-CM

## 2019-11-17 DIAGNOSIS — D649 Anemia, unspecified: Secondary | ICD-10-CM

## 2019-11-17 DIAGNOSIS — I10 Essential (primary) hypertension: Secondary | ICD-10-CM | POA: Diagnosis not present

## 2019-11-17 DIAGNOSIS — E538 Deficiency of other specified B group vitamins: Secondary | ICD-10-CM

## 2019-11-17 NOTE — Assessment & Plan Note (Signed)
Bisoprolol 10 mg twice daily alone is controlling blood pressure.

## 2019-11-17 NOTE — Assessment & Plan Note (Signed)
10/25/2019 B12 at 150.  She cannot absorb the B12 and will require parenteral B12.

## 2019-11-17 NOTE — Progress Notes (Signed)
NURSING HOME LOCATION:  [facility] ROOM NUMBER:  126-B  CODE STATUS:  DNR  PCP:  Hendricks Limes, MD  [place] [address]   This is a nursing facility follow up of chronic medical diagnoses.  Interim medical record and care since last [facility] visit was updated with review of diagnostic studies and change in clinical status since last visit were documented.  HPI: She is a permanent resident of the facility with diagnoses of history of stroke, essential hypertension, dyslipidemia, GERD, CKD stage III, history of congestive heart failure, aortic insufficiency, history of temporal arteritis, history of pancytopenia and coronary artery disease. She has had coronary artery stenting.  On 3/21 creatinine was 1.33 with a GFR 36.  Protein malnutrition was suggested by an albumin of 2.5 and total protein of 6.1.  Also on 3/21 hemoglobin 8.4/hematocrit 26.6.  She has a chronic anemia and the hemoglobin has not been above 10.5.  On 4/26 B12 level was extremely low at 150.  Epic lists parenteral B12 with 1000 mcg IM weekly.  She is also on ferrous sulfate daily. Ensure and Pro-Stat are supplemented twice daily for the protein caloric malnutrition. She is on bisoprolol 10 mg twice daily.  Review of systems: Dementia invalidated responses.  She was unable to give me the date.  She did state that intermittently she will have 2-3 bowel movements a day which she relates to changes in diet.  She also mentions slight numbness in the feet on occasion.  She admits to "some little depression".  Constitutional: No fever, significant weight change, fatigue  Eyes: No redness, discharge, pain, vision change ENT/mouth: No nasal congestion,  purulent discharge, earache, change in hearing, sore throat  Cardiovascular: No chest pain, palpitations, paroxysmal nocturnal dyspnea, claudication, edema  Respiratory: No cough, sputum production, hemoptysis, DOE, significant  snoring, apnea   Gastrointestinal: No heartburn, dysphagia, abdominal pain, nausea /vomiting, rectal bleeding, melena Genitourinary: No dysuria, hematuria, pyuria, incontinence, nocturia Musculoskeletal: No joint stiffness, joint swelling, weakness, pain Dermatologic: No rash, pruritus, change in appearance of skin Neurologic: No dizziness, headache, syncope, seizures Endocrine: No change in hair/skin/nails, excessive thirst, excessive hunger, excessive urination  Hematologic/lymphatic: No significant bruising, lymphadenopathy, abnormal bleeding Allergy/immunology: No itchy/watery eyes, significant sneezing, urticaria, angioedema  Physical exam:  Pertinent or positive findings: She appears her stated age and chronically ill.  Pattern alopecia is present.  There is dramatic ectropion of the lower lids.  Macrognathia is present of the mandible.  She is edentulous.  Strength was to opposition was surprisingly good although the right lower extremity was weaker than the left.  Slight clubbing of the nailbeds is suggested.  General appearance: no acute distress, increased work of breathing is present.   Lymphatic: No lymphadenopathy about the head, neck, axilla. Eyes: No conjunctival inflammation or lid edema is present. There is no scleral icterus. Ears:  External ear exam shows no significant lesions or deformities.   Nose:  External nasal examination shows no deformity or inflammation. Nasal mucosa are pink and moist without lesions, exudates Oral exam:  Lips and gums are healthy appearing. There is no oropharyngeal erythema or exudate. Neck:  No thyromegaly, masses, tenderness noted.    Heart:  Normal rate and regular rhythm. S1 and S2 normal without gallop, murmur, click, rub .  Lungs: Chest clear to auscultation without wheezes, rhonchi, rales, rubs. Abdomen: Bowel sounds are normal. Abdomen is soft and nontender with no organomegaly, hernias, masses. GU: Deferred  Extremities:  No  cyanosis,   edema  Neurologic exam :Balance, Rhomberg, finger to nose testing could not be completed due to clinical state Skin: Warm & dry w/o tenting. No significant lesions or rash.  See summary under each active problem in the Problem List with associated updated therapeutic plan

## 2019-11-17 NOTE — Assessment & Plan Note (Signed)
09/19/2019 hemoglobin 8.4/hematocrit 26.6.  Platelet count is normal.  On 4/26 B12 level was 150. Verify B12 supplementation.

## 2019-11-17 NOTE — Assessment & Plan Note (Signed)
09/19/2019 albumin 2.5, total protein 6.1.  She is receiving Ensure supplementation twice daily.

## 2019-11-17 NOTE — Patient Instructions (Signed)
See assessment and plan under each diagnosis in the problem list and acutely for this visit 

## 2019-11-18 ENCOUNTER — Other Ambulatory Visit: Payer: Self-pay

## 2019-11-18 ENCOUNTER — Ambulatory Visit (INDEPENDENT_AMBULATORY_CARE_PROVIDER_SITE_OTHER): Payer: Medicare Other | Admitting: Adult Health

## 2019-11-18 ENCOUNTER — Encounter: Payer: Self-pay | Admitting: Adult Health

## 2019-11-18 ENCOUNTER — Telehealth: Payer: Self-pay | Admitting: Adult Health

## 2019-11-18 VITALS — BP 110/73 | HR 58 | Ht <= 58 in

## 2019-11-18 DIAGNOSIS — I610 Nontraumatic intracerebral hemorrhage in hemisphere, subcortical: Secondary | ICD-10-CM

## 2019-11-18 DIAGNOSIS — S065XAA Traumatic subdural hemorrhage with loss of consciousness status unknown, initial encounter: Secondary | ICD-10-CM

## 2019-11-18 DIAGNOSIS — I10 Essential (primary) hypertension: Secondary | ICD-10-CM

## 2019-11-18 DIAGNOSIS — E785 Hyperlipidemia, unspecified: Secondary | ICD-10-CM | POA: Diagnosis not present

## 2019-11-18 DIAGNOSIS — F0390 Unspecified dementia without behavioral disturbance: Secondary | ICD-10-CM | POA: Diagnosis not present

## 2019-11-18 MED ORDER — ASPIRIN EC 81 MG PO TBEC: 81.0000 mg | DELAYED_RELEASE_TABLET | Freq: Every day | ORAL | Status: AC

## 2019-11-18 NOTE — Progress Notes (Signed)
[facility] [address] [place]. [place] [address] [phone]       STROKE FOLLOW UP NOTE  [name] Date of Birth:  10/31/1928 Medical Record Number:  [id number]   Reason for Referral:  stroke follow up    CHIEF COMPLAINT:  Chief Complaint  Patient presents with  . Follow-up    hx of stroke, pt is alone, treatment rm    HPI:  Today, 11/18/2019, [name] returns for follow-up regarding history of left frontal small periventricular hemorrhage with extensive IVH secondary to HTN in 03/2019.  She continues to reside at [facility]. Per review of med list, continues on atorvastatin for secondary stroke prevention.  Blood pressure today 110/73. Order placed after prior visit to repeat CT head to assess resolution of prior hemorrhage after prior visit for possible restart of aspirin due to underlying history of prior strokes, CAD and PAD but presented to ED prior to imaging on 09/16/2019 due to traumatic SDH in 08/2019 therefore aspirin not started. She was also treated for UTI and encephalopathy during admission.   Limited visit due to dementia and patient being unaccompanied at todays visit. She did tell me she was doing well and currently residing in her home where she has lived for many years. She is very pleasant and conversant but speaks about random topics. She will attempt to answer questions but then will say "I forget a lot".  Evidence of hallucinations convinced dogs were in the office, a bug on her foot and concerned that she left her shoes at home as she was only in socks      History provided for reference purposes only Initial visit 06/09/2019 JM: [name] is a 86 year old female who is being seen today for hospital follow-up accompanied by facility secretary who assisted with transportation.  She continues to reside at [facility].  Limited exam and history taking due to underlying dementia.  Currently sitting in wheelchair as she is  nonambulatory.  No evidence of residual weakness but does have difficulty lifting legs with slowed movement.  Continues on atorvastatin without myalgias.  Blood pressure today 102/70.  She continues to be seen routinely by facility providers.  No further concerns at this time.  Stroke admission 03/17/2019: Ms. CONCETTINA [name] is a 86 y.o. female with history of HTN, HLD, previous stroke, CAD, CHF, CKD  presented on 03/17/2019 after being found down with severe HTN, L gaze deviation, not following commands.  Stroke work-up showed left frontal small periventricular hemorrhage with extensive IVH likely secondary to uncontrolled hypertension.  CT head showed large IVH with ventricular dilation, 9 mm left frontal lobe, signs of edema and hematoma along vertex without evidence of fracture.  CTA head/neck no underlying vascular lesion for left hemorrhage but did show arthrosclerosis bilateral ICA bifurcations 60%, 70% or more right V4 and 50 to 70% left V4.  MRI stable IVH with increased ICH with hemorrhage originating from left periventricular white matter, stable ventriculomegaly without midline shift and no evidence of infarct with skull hematoma.  Repeat CT head stable.  2D echo normal EF without cardiac source of emboli.  History of HTN found in hypertensive emergency on admission with BP 234/127 stabilized with Cleviprex and resumed Lasix, isosorbide, losartan and increased dose of zebeta to 10 mg twice daily and added amlodipine 10 mg daily.  LDL 113 and recommended continuation of atorvastatin 40 mg daily.  No evidence or history of DM with A1c 5.2.  Hospital course complicated  with E. coli bacteremia with fever treated with antibiotics.  Monitoring of AKI on CKD stage III.  Evidence of thrombocytopenia with monitoring of CBC.  Other stroke risk factors include advanced age, former tobacco use, history of stroke/TIA and CAD.  Other active problems include initial use of c-collar due to fall and removed after  negative imaging, hypokalemia and history of temporal arteritis in 2014.  Residual deficits cognitive impairment generalized weakness and dysphagia and was discharged to SNF.  ICH: L frontal small periventricular hemorrhage with extensive IVH likely secondary to uncontrolled hypertension  CT C-Spine degenerative changes, no acute abnormality  CT head large IVH w/ ventricular dilatation. 9 mm L frontal lobe. Sign scalp edema and hematoma along vertex, no fx.  CTA head & neck no underlying vascular lesion for L hemorrhage. Atherosclerosis B ICA bifurcations mas 60%, 70% or more R V4, 50-70% L V4.  MRI  Stable IVH, increased SAH. Hemorrhage originiated from L periventricular white matter. Stable ventriculomegaly, no midline shift. No infarct. Scalp hematoma  CT head 03/21/19 stable IVH and SAH  2D Echo EF 60-65%. No source of embolus   LDL 113  HgbA1c 5.2  Heparin subq for VTE prophylaxis  aspirin 81 mg daily prior to admission, now on No antithrombotic given hemorrhage   Therapy recommendations: SNF  Disposition: SNF    ROS:   14 system review of systems performed and negative with exception of memory loss and confusion  PMH:  Past Medical History:  Diagnosis Date  . Anemia   . Aortic insufficiency    mild  . Arthritis    "arms" (08/09/2015)  . CAD (coronary artery disease)    cath 08/09/2014 95% stenosis in prox to mid RCA s/p DES, 80-90% prox OM2, 50% distal LAD  . CHF (congestive heart failure) ([place])   . CKD (chronic kidney disease) stage 3, GFR 30-59 ml/min   . Colon polyp    2009 colonoscopy, not retrieved for pathology  . Dyspnea   . Ectropion of left lower eyelid   . GERD (gastroesophageal reflux disease) 2009   EGD with benign gastric polyp too  . Hyperlipidemia   . Hypertension   . Pneumonia    history of  . Stroke ([place]) 1975  . Vitamin D deficiency     PSH:  Past Surgical History:  Procedure Laterality Date  . ABDOMINAL HYSTERECTOMY    . APPENDECTOMY     . ARTERY BIOPSY Left 12/16/2012   Procedure: BIOPSY TEMPORAL ARTERY;  Surgeon: Mal Misty, MD;  Location: [place];  Service: Vascular;  Laterality: Left;  . CARDIAC CATHETERIZATION    . LEFT HEART CATHETERIZATION WITH CORONARY ANGIOGRAM N/A 08/09/2014   Procedure: LEFT HEART CATHETERIZATION WITH CORONARY ANGIOGRAM;  Surgeon: Peter M Martinique, MD;  Location: [facility] CATH LAB;  Service: Cardiovascular;  Laterality: N/A;  . PERCUTANEOUS CORONARY STENT INTERVENTION (PCI-S)  08/09/2014   Procedure: PERCUTANEOUS CORONARY STENT INTERVENTION (PCI-S);  Surgeon: Peter M Martinique, MD;  Location: [facility] CATH LAB;  Service: Cardiovascular;;  . TONSILLECTOMY      Social History:  Social History   Socioeconomic History  . Marital status: Widowed    Spouse name: Not on file  . Number of children: Not on file  . Years of education: Not on file  . Highest education level: Not on file  Occupational History  . Not on file  Tobacco Use  . Smoking status: Former Smoker    Types: Cigarettes    Quit date: 07/02/1979  Years since quitting: 40.4  . Smokeless tobacco: Former Systems developer  . Tobacco comment: Started in teenage years - Quit 1980  Substance and Sexual Activity  . Alcohol use: No    Alcohol/week: 0.0 standard drinks    Comment: Quit alcohol 1975-76  . Drug use: No  . Sexual activity: Never  Other Topics Concern  . Not on file  Social History Narrative   Ms. Balash lives alone in an apartment in [place]. Her nephew, and her son and his wife live nearby and visit and help often. She is independent in ADLs but depends on family for IADLs. She is ambulatory in her apartment without a walker but requires this when she leaves her apartment.    Social Determinants of Health   Financial Resource Strain:   . Difficulty of Paying Living Expenses:   Food Insecurity:   . Worried About Charity fundraiser in the Last Year:   . Arboriculturist in the Last Year:   Transportation Needs:   . Film/video editor  (Medical):   Marland Kitchen Lack of Transportation (Non-Medical):   Physical Activity:   . Days of Exercise per Week:   . Minutes of Exercise per Session:   Stress:   . Feeling of Stress :   Social Connections:   . Frequency of Communication with Friends and Family:   . Frequency of Social Gatherings with Friends and Family:   . Attends Religious Services:   . Active Member of Clubs or Organizations:   . Attends Archivist Meetings:   Marland Kitchen Marital Status:   Intimate Partner Violence:   . Fear of Current or Ex-Partner:   . Emotionally Abused:   Marland Kitchen Physically Abused:   . Sexually Abused:     Family History:  Family History  Problem Relation Age of Onset  . Stroke Mother   . Hypertension Mother   . Stroke Father   . Hypertension Father   . Diabetes Sister     Medications:   Current Outpatient Medications on File Prior to Visit  Medication Sig Dispense Refill  . acetaminophen (TYLENOL) 325 MG tablet Take 650 mg by mouth every 6 (six) hours as needed for mild pain or headache.     . albuterol (VENTOLIN HFA) 108 (90 Base) MCG/ACT inhaler Inhale 1 puff into the lungs every 6 (six) hours as needed for wheezing or shortness of breath.    . Amino Acids-Protein Hydrolys (FEEDING SUPPLEMENT, PRO-STAT SUGAR FREE 64,) LIQD Take 30 mLs by mouth in the morning and at bedtime.    Marland Kitchen atorvastatin (LIPITOR) 40 MG tablet Take 1 tablet (40 mg total) by mouth daily at 6 PM. 30 tablet 0  . bisacodyl (DULCOLAX) 10 MG suppository Place 10 mg rectally daily as needed (for constipation not relieved by Milk of Magnesia).     . bisoprolol (ZEBETA) 10 MG tablet Take 10 mg by mouth in the morning and at bedtime.    . Cyanocobalamin 1000 MCG/ML KIT Inject 1,000 mcg as directed once a week.    . Eyelid Cleansers (OCUSOFT LID SCRUB PLUS) PADS Place 1 each into both eyes See admin instructions. CLEANSE BILATERAL EYELASHES ONCE DAILY DX: DRY EYES     . feeding supplement, ENSURE ENLIVE, (ENSURE ENLIVE) LIQD Take 237  mLs by mouth 2 (two) times daily between meals. 237 mL 12  . ferrous sulfate 325 (65 FE) MG tablet Take 325 mg by mouth daily with breakfast.    . furosemide (LASIX) 20  MG tablet Take 20 mg by mouth every Monday, Wednesday, and Friday.    . magnesium hydroxide (MILK OF MAGNESIA) 400 MG/5ML suspension Take 30 mLs by mouth daily as needed for mild constipation.     . Nutritional Supplement LIQD Take 120 mLs by mouth in the morning, at noon, and at bedtime. MedPass    . Nutritional Supplements (NUTRITIONAL SUPPLEMENT PO) Take 1 each by mouth daily. Magic Cup    . pantoprazole sodium (PROTONIX) 40 mg/20 mL PACK Take 20 mLs (40 mg total) by mouth daily. 30 mL 0  . polyvinyl alcohol (ARTIFICIAL TEARS) 1.4 % ophthalmic solution Place 1 drop into both eyes 2 (two) times daily. For dry eyes 15 mL 0  . senna (SENOKOT) 8.6 MG TABS tablet Take 2 tablets by mouth at bedtime.     No current facility-administered medications on file prior to visit.    Allergies:   Allergies  Allergen Reactions  . Remeron [Mirtazapine]     Worsening of baseline AMS with frank encephalopathy     Physical Exam  Vitals:   11/18/19 0804  BP: 110/73  Pulse: (!) 58  Height: '4\' 10"'  (1.473 m)   Body mass index is 23.12 kg/m. No exam data present   General: Frail pleasant elderly African-American female, seated, in no evident distress Head: head normocephalic and atraumatic.   Neck: supple with no carotid or supraclavicular bruits Cardiovascular: regular rate and rhythm, no murmurs Musculoskeletal: no deformity Skin:  no rash/petichiae Vascular:  Normal pulses all extremities   Neurologic Exam Mental Status: Alert and awake.  No evidence of speech or aphasia.  Disoriented to place, time and age but oriented to name. Recent and remote memory impaired. Attention span, concentration and fund of knowledge impaired. Follows simple step commands without difficulty. Mood and affect appropriate and cooperative with exam.    Cranial Nerves:  Bilateral lower eyelid ectropion with mild bilateral eye opening apraxia. Extraocular movements full without nystagmus.  Blinking to visual threat bilaterally.  Hearing impaired. Facial sensation intact. Face, tongue, palate moves normally and symmetrically.  Motor: Normal bulk and tone.  Full strength and generalized muscle atrophy Sensory.: intact to touch , pinprick , position and vibratory sensation.  Coordination: Rapid alternating movements delayed. Finger-to-nose performed accurately bilaterally and heel-to-shin difficulty performing likely due to weakness and cognition. Gait and Station: Deferred as patient nonambulatory Reflexes: 1+ and symmetric. Toes downgoing.      ASSESSMENT: Jane [name] is a 86 y.o. year old female presented with after being found down with severe HTN, left gaze deviation and not following commands on 03/17/2019 with stroke work-up revealing left frontal small periventricular hemorrhage with extensive IVH likely secondary to uncontrolled HTN. Vascular risk factors include HTN, HLD, prior stroke, CAD, CHF and CKD.  On 09/16/2019, presented to ED post fall with finding of acute on chronic right subdural hematoma.     PLAN:  1. Left frontal hemorrhage: Previously on aspirin with history of prior strokes, CAD post stent and PAD therefore recommend restarting aspirin 81 mg at this time as benefit outweighs risk and blood pressure has been in good control.  We will repeat CT head for follow-up regarding SDH. Continue Lipitor for secondary stroke prevention. Maintain strict control of hypertension with blood pressure goal below 130/90, diabetes with hemoglobin A1c goal below 6.5% and cholesterol with LDL cholesterol (bad cholesterol) goal below 70 mg/dL.  I also advised the patient to eat a healthy diet with plenty of whole grains, cereals, fruits and  vegetables, exercise regularly with at least 30 minutes of continuous activity daily and maintain ideal  body weight.  2. HTN: Stable.  Continue to follow with PCP for monitoring and management 3. HLD: Continuation of atorvastatin and ongoing follow-up with PCP for prescribing, monitoring and management 4. Dementia w/o behaviors: Limited today's visit as she was unaccompanied but per review of chart and facility notes, appears stable.  No behavioral concerns.   Recommend follow-up as needed at this time as no indication for routine office visits please do not hesitate to call with any questions or concerns regarding prior stroke   I spent 25 minutes of face-to-face and non-face-to-face time with patient.  This included previsit chart review, lab review, study review, order entry, electronic health record documentation, patient education     Frann Rider, [facility]  [facility] [address] [place], [place] [address]  Phone [phone] Fax [phone] Note: This document was prepared with digital dictation and possible smart phrase technology. Any transcriptional errors that result from this process are unintentional.

## 2019-11-18 NOTE — Patient Instructions (Addendum)
Your Plan:  Start aspirin 81 mg daily for secondary stroke prevention and underlying history of prior strokes, CAD post stent and PAD as benefit of aspirin would outweigh risks  Repeat CT head to follow-up on subdural hematoma post fall  Continue all other treatment plan and follow-up as needed due to difficulty with visits due to patient's baseline dementia     Thank you for coming to see [place] at [facility]. I hope we have been able to provide you high quality care today.  You may receive a patient satisfaction survey over the next few weeks. We would appreciate your feedback and comments so that we may continue to improve ourselves and the health of our patients.

## 2019-11-18 NOTE — Telephone Encounter (Signed)
medicare/medicaid order sent to GI. No auth they will reach out to the patient to schedule.  °

## 2019-11-24 DIAGNOSIS — R293 Abnormal posture: Secondary | ICD-10-CM | POA: Diagnosis not present

## 2019-11-24 DIAGNOSIS — I6300 Cerebral infarction due to thrombosis of unspecified precerebral artery: Secondary | ICD-10-CM | POA: Diagnosis not present

## 2019-11-25 DIAGNOSIS — I6300 Cerebral infarction due to thrombosis of unspecified precerebral artery: Secondary | ICD-10-CM | POA: Diagnosis not present

## 2019-11-25 DIAGNOSIS — R293 Abnormal posture: Secondary | ICD-10-CM | POA: Diagnosis not present

## 2019-11-26 DIAGNOSIS — I6300 Cerebral infarction due to thrombosis of unspecified precerebral artery: Secondary | ICD-10-CM | POA: Diagnosis not present

## 2019-11-26 DIAGNOSIS — R293 Abnormal posture: Secondary | ICD-10-CM | POA: Diagnosis not present

## 2019-11-29 DIAGNOSIS — D649 Anemia, unspecified: Secondary | ICD-10-CM | POA: Diagnosis not present

## 2019-11-29 DIAGNOSIS — R293 Abnormal posture: Secondary | ICD-10-CM | POA: Diagnosis not present

## 2019-11-29 DIAGNOSIS — I6300 Cerebral infarction due to thrombosis of unspecified precerebral artery: Secondary | ICD-10-CM | POA: Diagnosis not present

## 2019-11-29 LAB — CBC AND DIFFERENTIAL
HCT: 24 — AB (ref 36–46)
Hemoglobin: 8.4 — AB (ref 12.0–16.0)
Neutrophils Absolute: 2
Platelets: 142 — AB (ref 150–399)
WBC: 4.2

## 2019-11-29 LAB — CBC: RBC: 2.65 — AB (ref 3.87–5.11)

## 2019-12-09 ENCOUNTER — Encounter: Payer: Self-pay | Admitting: Adult Health

## 2019-12-09 ENCOUNTER — Non-Acute Institutional Stay (SKILLED_NURSING_FACILITY): Payer: Medicare Other | Admitting: Adult Health

## 2019-12-09 DIAGNOSIS — F0150 Vascular dementia without behavioral disturbance: Secondary | ICD-10-CM

## 2019-12-09 DIAGNOSIS — E538 Deficiency of other specified B group vitamins: Secondary | ICD-10-CM

## 2019-12-09 DIAGNOSIS — D508 Other iron deficiency anemias: Secondary | ICD-10-CM

## 2019-12-09 DIAGNOSIS — I10 Essential (primary) hypertension: Secondary | ICD-10-CM | POA: Diagnosis not present

## 2019-12-09 DIAGNOSIS — I5032 Chronic diastolic (congestive) heart failure: Secondary | ICD-10-CM | POA: Diagnosis not present

## 2019-12-09 NOTE — Progress Notes (Signed)
Location:  [place] Room Number: [place] of Service:  SNF (31) Provider:  Durenda Age, DNP, FNP-BC  Patient Care Team: Hendricks Limes, MD as PCP - General (Internal Medicine) Medina-Vargas, Senaida Lange, NP as Nurse Practitioner (Internal Medicine)  Extended Emergency Contact Information Primary Emergency Contact: [name] Mobile Phone: [phone] Relation: Nephew Secondary Emergency Contact: Ernie Avena Mobile Phone: [phone] Relation: Relative  Code Status:  DNR  Goals of care: Advanced Directive information Advanced Directives 11/17/2019  Does Patient Have a Medical Advance Directive? Yes  Type of Advance Directive Out of facility DNR (pink MOST or yellow form)  Does patient want to make changes to medical advance directive? No - Patient declined  Copy of [place] in Chart? -  Would patient like information on creating a medical advance directive? -  Pre-existing out of facility DNR order (yellow form or pink MOST form) Yellow form placed in chart (order not valid for inpatient use)     Chief Complaint  Patient presents with   Medical Management of Chronic Issues    HPI:  Pt is a 86 y.o. female seen today for medical management of chronic diseases. She has PMH of CHF, CKD stage III, hypertension, hyperlipidemia, aortic insufficiency and CAD. Latest BP 154/69. She denies headache. SBPs ranging from 111 to 154/69. She currently takes Bisoprolol 10 mg daily. Latest hgb 8.4 (11/29/19), down from 9.2 (08/03/19). She currently takes FeSO4 325 mg daily for anemia. She currently participates in restorative eating/swallowing. She gained 4.2 lbs in a month.   Past Medical History:  Diagnosis Date   Anemia    Aortic insufficiency    mild   Arthritis    "arms" (08/09/2015)   CAD (coronary artery disease)    cath 08/09/2014 95% stenosis in prox to mid RCA s/p DES, 80-90% prox OM2, 50% distal LAD   CHF  (congestive heart failure) (HCC)    CKD (chronic kidney disease) stage 3, GFR 30-59 ml/min    Colon polyp    2009 colonoscopy, not retrieved for pathology   Dyspnea    Ectropion of left lower eyelid    GERD (gastroesophageal reflux disease) 2009   EGD with benign gastric polyp too   Hyperlipidemia    Hypertension    Pneumonia    history of   Stroke ([place]) 1975   Vitamin D deficiency    Past Surgical History:  Procedure Laterality Date   ABDOMINAL HYSTERECTOMY     APPENDECTOMY     ARTERY BIOPSY Left 12/16/2012   Procedure: BIOPSY TEMPORAL ARTERY;  Surgeon: Mal Misty, MD;  Location: [place];  Service: Vascular;  Laterality: Left;   CARDIAC CATHETERIZATION     LEFT HEART CATHETERIZATION WITH CORONARY ANGIOGRAM N/A 08/09/2014   Procedure: LEFT HEART CATHETERIZATION WITH CORONARY ANGIOGRAM;  Surgeon: Peter M Martinique, MD;  Location: [facility] CATH LAB;  Service: Cardiovascular;  Laterality: N/A;   PERCUTANEOUS CORONARY STENT INTERVENTION (PCI-S)  08/09/2014   Procedure: PERCUTANEOUS CORONARY STENT INTERVENTION (PCI-S);  Surgeon: Peter M Martinique, MD;  Location: [facility] CATH LAB;  Service: Cardiovascular;;   TONSILLECTOMY      Allergies  Allergen Reactions   Remeron [Mirtazapine]     Worsening of baseline AMS with frank encephalopathy    Outpatient Encounter Medications as of 12/09/2019  Medication Sig   acetaminophen (TYLENOL) 325 MG tablet Take 650 mg by mouth every 6 (six) hours as needed for mild pain or headache.    albuterol (VENTOLIN HFA)  108 (90 Base) MCG/ACT inhaler Inhale 1 puff into the lungs every 6 (six) hours as needed for wheezing or shortness of breath.   Amino Acids-Protein Hydrolys (FEEDING SUPPLEMENT, PRO-STAT SUGAR FREE 64,) LIQD Take 30 mLs by mouth in the morning and at bedtime.   aspirin EC 81 MG tablet Take 1 tablet (81 mg total) by mouth daily.   atorvastatin (LIPITOR) 40 MG tablet Take 1 tablet (40 mg total) by mouth daily at 6 PM.   bisacodyl  (DULCOLAX) 10 MG suppository Place 10 mg rectally daily as needed (for constipation not relieved by Milk of Magnesia).    bisoprolol (ZEBETA) 10 MG tablet Take 10 mg by mouth in the morning and at bedtime.   Eyelid Cleansers (OCUSOFT LID SCRUB PLUS) PADS Place 1 each into both eyes See admin instructions. CLEANSE BILATERAL EYELASHES ONCE DAILY DX: DRY EYES    feeding supplement, ENSURE ENLIVE, (ENSURE ENLIVE) LIQD Take 237 mLs by mouth 2 (two) times daily between meals.   ferrous sulfate 325 (65 FE) MG tablet Take 325 mg by mouth daily with breakfast.   furosemide (LASIX) 20 MG tablet Take 20 mg by mouth every Monday, Wednesday, and Friday.   magnesium hydroxide (MILK OF MAGNESIA) 400 MG/5ML suspension Take 30 mLs by mouth daily as needed for mild constipation.    Nutritional Supplement LIQD Take 120 mLs by mouth in the morning, at noon, and at bedtime. MedPass   Nutritional Supplements (NUTRITIONAL SUPPLEMENT PO) Take 1 each by mouth daily. Magic Cup   pantoprazole sodium (PROTONIX) 40 mg/20 mL PACK Take 20 mLs (40 mg total) by mouth daily.   polyvinyl alcohol (ARTIFICIAL TEARS) 1.4 % ophthalmic solution Place 1 drop into both eyes 2 (two) times daily. For dry eyes   senna (SENOKOT) 8.6 MG TABS tablet Take 2 tablets by mouth at bedtime.   No facility-administered encounter medications on file as of 12/09/2019.    Review of Systems  Unable to obtain due to dementia   Immunization History  Administered Date(s) Administered   Influenza Whole 03/28/2008   Influenza,inj,Quad PF,6+ Mos 08/09/2013, 03/30/2015, 03/24/2017   Influenza-Unspecified 03/31/2019   Moderna SARS-COVID-2 Vaccination 07/19/2019, 08/16/2019   Pneumococcal Conjugate-13 12/08/2014   Pneumococcal Polysaccharide-23 09/18/2012   Tdap 02/01/2016, 06/21/2018   Pertinent  Health Maintenance Due  Topic Date Due   INFLUENZA VACCINE  01/30/2020   DEXA SCAN  Completed   PNA vac Low Risk Adult  Completed    Fall Risk  03/19/2018 10/16/2017 09/26/2017 07/14/2017 04/21/2017  Falls in the past year? No Yes No No No  Number falls in past yr: - 1 - - -  Injury with Fall? - Yes - - -  Risk Factor Category  - High Fall Risk - - -  Risk for fall due to : - Impaired mobility;Impaired balance/gait Impaired balance/gait;Impaired mobility - -  Follow up - Falls prevention discussed - - -  Comment - - - - -     Vitals:   12/09/19 1436  BP: (!) 154/69  Pulse: 66  Resp: 18  Temp: 98.1 F (36.7 C)  Weight: 114 lb 12.8 oz (52.1 kg)  Height: 4\' 10"  (1.473 m)   Body mass index is 23.99 kg/m.  Physical Exam  GENERAL APPEARANCE:  In no acute distress.  SKIN:  Skin is warm and dry.  MOUTH and THROAT: Lips are without lesions. Oral mucosa is moist and without lesions.  RESPIRATORY: Breathing is even & unlabored, BS CTAB CARDIAC: RRR, no murmur,no  extra heart sounds, no edema GI: Abdomen soft, normal BS, no masses, no tenderness EXTREMITIES:  Able to move X 4 extremities NEUROLOGICAL: There is no tremor. Speech is clear. Alert to self, disoriented to time and place. PSYCHIATRIC:  Affect and behavior are appropriate  Labs reviewed:  11/29/19  hgb 8.4, wbc 4.2, platelet 142  Recent Labs    03/17/19 1922 03/19/19 0621 09/17/19 0320 09/18/19 0449 09/19/19 0814  NA  --    < > 143 140 139  K  --    < > 3.9 3.0* 4.1  CL  --    < > 104 103 110  CO2  --    < > 24 23 22   GLUCOSE  --    < > 113* 96 122*  BUN  --    < > 63* 61* 47*  CREATININE  --    < > 2.73* 1.91* 1.33*  CALCIUM  --    < > 9.1 8.6* 8.2*  MG 1.7  --   --  1.7  --    < > = values in this interval not displayed.   Recent Labs    03/17/19 1811 09/16/19 2030 09/19/19 0814  AST 34 18 19  ALT 17 11 10   ALKPHOS 60 48 44  BILITOT 2.4* 1.1 0.8  PROT 6.7 6.5 6.1*  ALBUMIN 3.5 2.8* 2.5*   Recent Labs    03/25/19 0346 08/03/19 0000 09/16/19 2030 09/16/19 2030 09/17/19 0320 09/18/19 0449 09/19/19 0814  WBC   < > 4.9 5.6    < > 7.2 4.7 4.1  NEUTROABS  --  3 3.6  --   --   --  2.4  HGB   < > 9.2* 8.9*   < > 9.4* 8.8* 8.4*  HCT   < > 27* 28.6*   < > 30.2* 27.2* 26.6*  MCV   < >  --  99.0   < > 97.4 96.5 97.4  PLT   < > 158 158   < > 169 167 178   < > = values in this interval not displayed.   Lab Results  Component Value Date   TSH 0.923 05/31/2014   Lab Results  Component Value Date   HGBA1C 5.1 03/24/2019   Lab Results  Component Value Date   CHOL 185 03/19/2019   HDL 43 03/19/2019   LDLCALC 113 (H) 03/19/2019   TRIG 146 03/19/2019   CHOLHDL 4.3 03/19/2019     Assessment/Plan  1. Essential hypertension - BPs elevated, will add Amlodipine 5 mg daily - continue Bisoprolol - monitor BPs  2. Iron deficiency anemia secondary to inadequate dietary iron intake - hgb 8.4 - will increase FeSO4 325 mg from daily to BID - repeat CBC in a month  3. Vitamin B12 deficiency Lab Results  Component Value Date   VITAMINB12 150 10/25/2019   - continue Vitamin B12 1,000 mcg IM monthly -  Check B12 level in a month  4. Chronic heart failure with preserved ejection fraction (HCC) - no SOB, continue Furosemide 20 mg daily on MWF  5.  Vascular dementia without behavioral disturbance ([place]) - continue supportive care - fall precautions    Family/ staff Communication: Discussed plan of care with resident and charge nurse.  Labs/tests ordered:  Vitamin B12 and CBC in 1 month  Goals of care:  Long-term care   Durenda Age, DNP, FNP-BC [facility] [phone] (Monday-Friday 8:00 a.m. - 5:00 p.m.) [phone] (  after hours)

## 2019-12-15 ENCOUNTER — Non-Acute Institutional Stay (SKILLED_NURSING_FACILITY): Payer: Medicare Other | Admitting: Adult Health

## 2019-12-15 ENCOUNTER — Encounter: Payer: Self-pay | Admitting: Adult Health

## 2019-12-15 DIAGNOSIS — I5032 Chronic diastolic (congestive) heart failure: Secondary | ICD-10-CM | POA: Diagnosis not present

## 2019-12-15 DIAGNOSIS — D508 Other iron deficiency anemias: Secondary | ICD-10-CM | POA: Diagnosis not present

## 2019-12-15 DIAGNOSIS — I10 Essential (primary) hypertension: Secondary | ICD-10-CM | POA: Diagnosis not present

## 2019-12-15 DIAGNOSIS — F0150 Vascular dementia without behavioral disturbance: Secondary | ICD-10-CM

## 2019-12-15 DIAGNOSIS — Z7189 Other specified counseling: Secondary | ICD-10-CM | POA: Diagnosis not present

## 2019-12-15 NOTE — Progress Notes (Signed)
Location:  [place] Room Number: 126-A Place of Service:  SNF (31) Provider:  Durenda Age, DNP, FNP-BC  Patient Care Team: Hendricks Limes, MD as PCP - General (Internal Medicine) Medina-Vargas, Senaida Lange, NP as Nurse Practitioner (Internal Medicine)  Extended Emergency Contact Information Primary Emergency Contact: [name] Mobile Phone: [phone] Relation: Nephew Secondary Emergency Contact: Ernie Avena Mobile Phone: [phone] Relation: Relative  Code Status:  DNR  Goals of care: Advanced Directive information Advanced Directives 11/17/2019  Does Patient Have a Medical Advance Directive? Yes  Type of Advance Directive Out of facility DNR (pink MOST or yellow form)  Does patient want to make changes to medical advance directive? No - Patient declined  Copy of [place] in Chart? -  Would patient like information on creating a medical advance directive? -  Pre-existing out of facility DNR order (yellow form or pink MOST form) Yellow form placed in chart (order not valid for inpatient use)     Chief Complaint  Patient presents with  . Advanced Directive    Patient is seen for a Care Plan Meeting    HPI:  Pt is an 86 y.o. female who had a care plan meeting today attended by social worker, MDS coordinator, NP, Dietician and nephew, [name]. Resident was invited but declined. She remains to be DNR. Discussed medications, vital signs and weights. Answered all medical questions. Currently, she participates in restorative eating/ swallowing by performing self feeding skills with set up. She gained 4.2 lbs in a month. Latest weight is 114.8 lbs. she eats 25 to 50% of her meals nephew wants to make an appointment for visitation.  He is a long-term care resident of [facility].  He has a PMH of CHF, chronic kidney disease stage III, hypertension, hyperlipidemia, aortic insufficiency and CAD.   The meeting lasted for 25 minutes.   Past Medical History:  Diagnosis Date  . Anemia   . Aortic insufficiency    mild  . Arthritis    "arms" (08/09/2015)  . CAD (coronary artery disease)    cath 08/09/2014 95% stenosis in prox to mid RCA s/p DES, 80-90% prox OM2, 50% distal LAD  . CHF (congestive heart failure) ([place])   . CKD (chronic kidney disease) stage 3, GFR 30-59 ml/min   . Colon polyp    2009 colonoscopy, not retrieved for pathology  . Dyspnea   . Ectropion of left lower eyelid   . GERD (gastroesophageal reflux disease) 2009   EGD with benign gastric polyp too  . Hyperlipidemia   . Hypertension   . Pneumonia    history of  . Stroke ([place]) 1975  . Vitamin D deficiency    Past Surgical History:  Procedure Laterality Date  . ABDOMINAL HYSTERECTOMY    . APPENDECTOMY    . ARTERY BIOPSY Left 12/16/2012   Procedure: BIOPSY TEMPORAL ARTERY;  Surgeon: Mal Misty, MD;  Location: [place];  Service: Vascular;  Laterality: Left;  . CARDIAC CATHETERIZATION    . LEFT HEART CATHETERIZATION WITH CORONARY ANGIOGRAM N/A 08/09/2014   Procedure: LEFT HEART CATHETERIZATION WITH CORONARY ANGIOGRAM;  Surgeon: Peter M Martinique, MD;  Location: [facility] CATH LAB;  Service: Cardiovascular;  Laterality: N/A;  . PERCUTANEOUS CORONARY STENT INTERVENTION (PCI-S)  08/09/2014   Procedure: PERCUTANEOUS CORONARY STENT INTERVENTION (PCI-S);  Surgeon: Peter M Martinique, MD;  Location: [facility] CATH LAB;  Service: Cardiovascular;;  . TONSILLECTOMY      Allergies  Allergen Reactions  .  Remeron [Mirtazapine]     Worsening of baseline AMS with frank encephalopathy    Outpatient Encounter Medications as of 12/15/2019  Medication Sig  . acetaminophen (TYLENOL) 325 MG tablet Take 650 mg by mouth every 6 (six) hours as needed for mild pain or headache.   . albuterol (VENTOLIN HFA) 108 (90 Base) MCG/ACT inhaler Inhale 1 puff into the lungs every 6 (six) hours as needed for wheezing or shortness of breath.  . Amino Acids-Protein  Hydrolys (FEEDING SUPPLEMENT, PRO-STAT SUGAR FREE 64,) LIQD Take 30 mLs by mouth in the morning and at bedtime.  Marland Kitchen amLODipine (NORVASC) 5 MG tablet Take 5 mg by mouth daily.  Marland Kitchen aspirin EC 81 MG tablet Take 1 tablet (81 mg total) by mouth daily.  Marland Kitchen atorvastatin (LIPITOR) 40 MG tablet Take 1 tablet (40 mg total) by mouth daily at 6 PM.  . bisacodyl (DULCOLAX) 10 MG suppository Place 10 mg rectally daily as needed (for constipation not relieved by Milk of Magnesia).   . bisoprolol (ZEBETA) 10 MG tablet Take 10 mg by mouth in the morning and at bedtime.  . cyanocobalamin (,VITAMIN B-12,) 1000 MCG/ML injection Inject 1,000 mcg into the muscle once.  . Eyelid Cleansers (OCUSOFT LID SCRUB PLUS) PADS Place 1 each into both eyes See admin instructions. CLEANSE BILATERAL EYELASHES ONCE DAILY DX: DRY EYES   . feeding supplement, ENSURE ENLIVE, (ENSURE ENLIVE) LIQD Take 237 mLs by mouth 2 (two) times daily between meals.  . ferrous sulfate 325 (65 FE) MG tablet Take 325 mg by mouth 2 (two) times daily with a meal.   . furosemide (LASIX) 20 MG tablet Take 20 mg by mouth every Monday, Wednesday, and Friday.  . magnesium hydroxide (MILK OF MAGNESIA) 400 MG/5ML suspension Take 30 mLs by mouth daily as needed for mild constipation.   . Nutritional Supplement LIQD Take 120 mLs by mouth in the morning, at noon, and at bedtime. MedPass  . Nutritional Supplements (NUTRITIONAL SUPPLEMENT PO) Take 1 each by mouth daily. Magic Cup  . pantoprazole sodium (PROTONIX) 40 mg/20 mL PACK Take 20 mLs (40 mg total) by mouth daily.  . polyvinyl alcohol (ARTIFICIAL TEARS) 1.4 % ophthalmic solution Place 1 drop into both eyes 2 (two) times daily. For dry eyes  . senna (SENOKOT) 8.6 MG TABS tablet Take 2 tablets by mouth at bedtime.   No facility-administered encounter medications on file as of 12/15/2019.    Review of Systems   unable to obtain due to dementia   Immunization History  Administered Date(s) Administered  .  Influenza Whole 03/28/2008  . Influenza,inj,Quad PF,6+ Mos 08/09/2013, 03/30/2015, 03/24/2017  . Influenza-Unspecified 03/31/2019  . Moderna SARS-COVID-2 Vaccination 07/19/2019, 08/16/2019  . Pneumococcal Conjugate-13 12/08/2014  . Pneumococcal Polysaccharide-23 09/18/2012  . Tdap 02/01/2016, 06/21/2018   Pertinent  Health Maintenance Due  Topic Date Due  . INFLUENZA VACCINE  01/30/2020  . DEXA SCAN  Completed  . PNA vac Low Risk Adult  Completed   Fall Risk  03/19/2018 10/16/2017 09/26/2017 07/14/2017 04/21/2017  Falls in the past year? No Yes No No No  Number falls in past yr: - 1 - - -  Injury with Fall? - Yes - - -  Risk Factor Category  - High Fall Risk - - -  Risk for fall due to : - Impaired mobility;Impaired balance/gait Impaired balance/gait;Impaired mobility - -  Follow up - Falls prevention discussed - - -  Comment - - - - -     Vitals:  12/15/19 0829  BP: 124/64  Pulse: 70  Resp: 18  Temp: 97.9 F (36.6 C)  TempSrc: Oral  Weight: 114 lb 12.8 oz (52.1 kg)  Height: 4\' 10"  (1.473 m)   Body mass index is 23.99 kg/m.  Physical Exam  GENERAL APPEARANCE:  In no acute distress. Normal body habitus SKIN:  Skin is warm and dry.  MOUTH and THROAT: Lips are without lesions. Oral mucosa is moist and without lesions. Tongue is normal in shape, size, and color and without lesions NECK: supple, trachea midline, no neck masses, no thyroid tenderness, no thyromegaly LYMPHATICS: No LAN in the neck, no supraclavicular LAN RESPIRATORY: Breathing is even & unlabored, BS CTAB CARDIAC: RRR, no murmur,no extra heart sounds, no edema GI: Abdomen soft, normal BS, no masses, no tenderness NEUROLOGICAL: There is no tremor. Speech is clear. Alert to self, disoriented to time and place  PSYCHIATRIC:  Affect and behavior are appropriate  Labs reviewed: Recent Labs    03/17/19 1922 03/19/19 0621 09/17/19 0320 09/18/19 0449 09/19/19 0814  NA  --    < > 143 140 139  K  --    < >  3.9 3.0* 4.1  CL  --    < > 104 103 110  CO2  --    < > 24 23 22   GLUCOSE  --    < > 113* 96 122*  BUN  --    < > 63* 61* 47*  CREATININE  --    < > 2.73* 1.91* 1.33*  CALCIUM  --    < > 9.1 8.6* 8.2*  MG 1.7  --   --  1.7  --    < > = values in this interval not displayed.   Recent Labs    03/17/19 1811 09/16/19 2030 09/19/19 0814  AST 34 18 19  ALT 17 11 10   ALKPHOS 60 48 44  BILITOT 2.4* 1.1 0.8  PROT 6.7 6.5 6.1*  ALBUMIN 3.5 2.8* 2.5*   Recent Labs    09/16/19 2030 09/16/19 2030 09/17/19 0320 09/17/19 0320 09/18/19 0449 09/19/19 0814 11/29/19 0000  WBC 5.6   < > 7.2   < > 4.7 4.1 4.2  NEUTROABS 3.6  --   --   --   --  2.4 2  HGB 8.9*   < > 9.4*   < > 8.8* 8.4* 8.4*  HCT 28.6*   < > 30.2*   < > 27.2* 26.6* 24*  MCV 99.0   < > 97.4  --  96.5 97.4  --   PLT 158   < > 169   < > 167 178 142*   < > = values in this interval not displayed.   Lab Results  Component Value Date   TSH 0.923 05/31/2014   Lab Results  Component Value Date   HGBA1C 5.1 03/24/2019   Lab Results  Component Value Date   CHOL 185 03/19/2019   HDL 43 03/19/2019   LDLCALC 113 (H) 03/19/2019   TRIG 146 03/19/2019   CHOLHDL 4.3 03/19/2019    Assessment/Plan  1. Advance care planning -Remains to be DNR -Discussed medications, vital signs and weights.  2. Essential hypertension -Stable, continue amlodipine  3. Iron deficiency anemia secondary to inadequate dietary iron intake Lab Results  Component Value Date   HGB 8.4 (A) 11/29/2019   -Continue ferrous sulfate  4. Chronic heart failure with preserved ejection fraction (HCC) -  No SOB, continue furosemide  5. Vascular  dementia without behavioral disturbance ([place]) -Continue supportive care, fall precautions    Family/ staff Communication: Discussed plan of care with nephew and IDT.  Labs/tests ordered:   None  Goals of care:   Long-term care  Durenda Age, DNP, FNP-BC [facility] [phone] (Monday-Friday 8:00 a.m. - 5:00 p.m.) [phone] (after hours)

## 2020-01-07 ENCOUNTER — Non-Acute Institutional Stay (SKILLED_NURSING_FACILITY): Payer: Medicare Other | Admitting: Adult Health

## 2020-01-07 ENCOUNTER — Encounter: Payer: Self-pay | Admitting: Adult Health

## 2020-01-07 DIAGNOSIS — E876 Hypokalemia: Secondary | ICD-10-CM | POA: Diagnosis not present

## 2020-01-07 DIAGNOSIS — I5032 Chronic diastolic (congestive) heart failure: Secondary | ICD-10-CM

## 2020-01-07 LAB — CBC: RBC: 2.87 — AB (ref 3.87–5.11)

## 2020-01-07 LAB — VITAMIN B12: Vitamin B-12: 481

## 2020-01-07 LAB — CBC AND DIFFERENTIAL
HCT: 26 — AB (ref 36–46)
Hemoglobin: 9.1 — AB (ref 12.0–16.0)
Neutrophils Absolute: 2
Platelets: 159 (ref 150–399)
WBC: 4

## 2020-01-07 LAB — COMPREHENSIVE METABOLIC PANEL
Calcium: 8.2 — AB (ref 8.7–10.7)
GFR calc Af Amer: 89.21
GFR calc non Af Amer: 76.98

## 2020-01-07 LAB — BASIC METABOLIC PANEL
BUN: 24 — AB (ref 4–21)
CO2: 29 — AB (ref 13–22)
Chloride: 104 (ref 99–108)
Creatinine: 0.7 (ref 0.5–1.1)
Glucose: 78
Potassium: 2.9 — AB (ref 3.4–5.3)
Sodium: 144 (ref 137–147)

## 2020-01-07 NOTE — Progress Notes (Signed)
Location:  [facility] Room Number: 126-A Place of Service:  SNF (31) Provider:  Durenda Age, DNP, FNP-BC  Patient Care Team: Hendricks Limes, MD as PCP - General (Internal Medicine) Medina-Vargas, Senaida Lange, NP as Nurse Practitioner (Internal Medicine)  Extended Emergency Contact Information Primary Emergency Contact: [name] Mobile Phone: [phone] Relation: Nephew Secondary Emergency Contact: Ernie Avena Mobile Phone: [phone] Relation: Relative  Code Status:  DNR  Goals of care: Advanced Directive information Advanced Directives 11/17/2019  Does Patient Have a Medical Advance Directive? Yes  Type of Advance Directive Out of facility DNR (pink MOST or yellow form)  Does patient want to make changes to medical advance directive? No - Patient declined  Copy of [place] in Chart? -  Would patient like information on creating a medical advance directive? -  Pre-existing out of facility DNR order (yellow form or pink MOST form) Yellow form placed in chart (order not valid for inpatient use)     Chief Complaint  Patient presents with   Acute Visit    Patient is seen due to a low potassium level of 2.9.    HPI:  Pt is an 86 y.o. female seen today for potassium 2.9, low. She is currently taking Lasix 20 mg daily on MWFs for CHF. No SOB. Weight fluctuates. Latest weight is 112.2, had weight loss of 2.6 lbs in a month.  She is currently having restorative eating/swallowing by performing self-feeding skills with meals set up.  She is a long-term care resident of [facility]. She has a PMH of CHF, chronic kidney disease stage III, hypertension, hyperlipidemia, aortic insufficiency and CAD.   Past Medical History:  Diagnosis Date   Anemia    Aortic insufficiency    mild   Arthritis    "arms" (08/09/2015)   CAD (coronary artery disease)    cath 08/09/2014 95% stenosis in prox to mid RCA  s/p DES, 80-90% prox OM2, 50% distal LAD   CHF (congestive heart failure) (HCC)    CKD (chronic kidney disease) stage 3, GFR 30-59 ml/min    Colon polyp    2009 colonoscopy, not retrieved for pathology   Dyspnea    Ectropion of left lower eyelid    GERD (gastroesophageal reflux disease) 2009   EGD with benign gastric polyp too   Hyperlipidemia    Hypertension    Pneumonia    history of   Stroke ([place]) 1975   Vitamin D deficiency    Past Surgical History:  Procedure Laterality Date   ABDOMINAL HYSTERECTOMY     APPENDECTOMY     ARTERY BIOPSY Left 12/16/2012   Procedure: BIOPSY TEMPORAL ARTERY;  Surgeon: Mal Misty, MD;  Location: [place];  Service: Vascular;  Laterality: Left;   CARDIAC CATHETERIZATION     LEFT HEART CATHETERIZATION WITH CORONARY ANGIOGRAM N/A 08/09/2014   Procedure: LEFT HEART CATHETERIZATION WITH CORONARY ANGIOGRAM;  Surgeon: Peter M Martinique, MD;  Location: [facility] CATH LAB;  Service: Cardiovascular;  Laterality: N/A;   PERCUTANEOUS CORONARY STENT INTERVENTION (PCI-S)  08/09/2014   Procedure: PERCUTANEOUS CORONARY STENT INTERVENTION (PCI-S);  Surgeon: Peter M Martinique, MD;  Location: [facility] CATH LAB;  Service: Cardiovascular;;   TONSILLECTOMY      Allergies  Allergen Reactions   Remeron [Mirtazapine]     Worsening of baseline AMS with frank encephalopathy    Outpatient Encounter Medications as of 01/07/2020  Medication Sig   acetaminophen (TYLENOL) 325 MG tablet Take 650 mg by  mouth every 6 (six) hours as needed for mild pain or headache.    albuterol (VENTOLIN HFA) 108 (90 Base) MCG/ACT inhaler Inhale 1 puff into the lungs every 6 (six) hours as needed for wheezing or shortness of breath.   Amino Acids-Protein Hydrolys (FEEDING SUPPLEMENT, PRO-STAT SUGAR FREE 64,) LIQD Take 30 mLs by mouth in the morning and at bedtime.   amLODipine (NORVASC) 5 MG tablet Take 5 mg by mouth daily.   aspirin EC 81 MG tablet Take 1 tablet (81 mg total) by mouth daily.     atorvastatin (LIPITOR) 40 MG tablet Take 1 tablet (40 mg total) by mouth daily at 6 PM.   bisacodyl (DULCOLAX) 10 MG suppository Place 10 mg rectally daily as needed (for constipation not relieved by Milk of Magnesia).    bisoprolol (ZEBETA) 10 MG tablet Take 10 mg by mouth in the morning and at bedtime.   cyanocobalamin (,VITAMIN B-12,) 1000 MCG/ML injection Inject 1,000 mcg into the muscle once.   Eyelid Cleansers (OCUSOFT LID SCRUB PLUS) PADS Place 1 each into both eyes See admin instructions. CLEANSE BILATERAL EYELASHES ONCE DAILY DX: DRY EYES    feeding supplement, ENSURE ENLIVE, (ENSURE ENLIVE) LIQD Take 237 mLs by mouth 2 (two) times daily between meals.   ferrous sulfate 325 (65 FE) MG tablet Take 325 mg by mouth 2 (two) times daily with a meal.    furosemide (LASIX) 20 MG tablet Take 20 mg by mouth every Monday, Wednesday, and Friday.   magnesium hydroxide (MILK OF MAGNESIA) 400 MG/5ML suspension Take 30 mLs by mouth daily as needed for mild constipation.    Nutritional Supplement LIQD Take 120 mLs by mouth in the morning, at noon, and at bedtime. MedPass   Nutritional Supplements (NUTRITIONAL SUPPLEMENT PO) Take 1 each by mouth daily. Magic Cup   pantoprazole sodium (PROTONIX) 40 mg/20 mL PACK Take 20 mLs (40 mg total) by mouth daily.   polyvinyl alcohol (ARTIFICIAL TEARS) 1.4 % ophthalmic solution Place 1 drop into both eyes 2 (two) times daily. For dry eyes   senna (SENOKOT) 8.6 MG TABS tablet Take 2 tablets by mouth in the morning and at bedtime.    No facility-administered encounter medications on file as of 01/07/2020.    Review of Systems unable to obtain due to dementia    Immunization History  Administered Date(s) Administered   Influenza Whole 03/28/2008   Influenza,inj,Quad PF,6+ Mos 08/09/2013, 03/30/2015, 03/24/2017   Influenza-Unspecified 03/31/2019   Moderna SARS-COVID-2 Vaccination 07/19/2019, 08/16/2019   Pneumococcal Conjugate-13 12/08/2014    Pneumococcal Polysaccharide-23 09/18/2012   Tdap 02/01/2016, 06/21/2018   Pertinent  Health Maintenance Due  Topic Date Due   INFLUENZA VACCINE  01/30/2020   DEXA SCAN  Completed   PNA vac Low Risk Adult  Completed   Fall Risk  03/19/2018 10/16/2017 09/26/2017 07/14/2017 04/21/2017  Falls in the past year? No Yes No No No  Number falls in past yr: - 1 - - -  Injury with Fall? - Yes - - -  Risk Factor Category  - High Fall Risk - - -  Risk for fall due to : - Impaired mobility;Impaired balance/gait Impaired balance/gait;Impaired mobility - -  Follow up - Falls prevention discussed - - -  Comment - - - - -     Vitals:   01/07/20 1559  BP: 122/64  Pulse: 65  Resp: 18  Temp: 98.4 F (36.9 C)  TempSrc: Oral  Weight: 112 lb 3.2 oz (50.9 kg)  Height:  4\' 10"  (1.473 m)   Body mass index is 23.45 kg/m.  Physical Exam  GENERAL APPEARANCE: Well nourished. In no acute distress. Normal body habitus SKIN:  Skin is warm and dry.  EYES:  Bilateral ectropion. MOUTH and THROAT: Lips are without lesions. Oral mucosa is moist and without lesions. Tongue is normal in shape, size, and color and without lesions RESPIRATORY: Breathing is even & unlabored, BS CTAB CARDIAC: RRR, no murmur,no extra heart sounds, no edema GI: Abdomen soft, normal BS, no masses, no tenderness EXTREMITIES: Able to move x4 extremities. NEUROLOGICAL: There is no tremor. Speech is clear.  Alert to self, disoriented to time and place PSYCHIATRIC:  Affect and behavior are appropriate  Labs reviewed: Recent Labs    03/17/19 1922 03/19/19 0621 09/17/19 0320 09/18/19 0449 09/19/19 0814  NA  --    < > 143 140 139  K  --    < > 3.9 3.0* 4.1  CL  --    < > 104 103 110  CO2  --    < > 24 23 22   GLUCOSE  --    < > 113* 96 122*  BUN  --    < > 63* 61* 47*  CREATININE  --    < > 2.73* 1.91* 1.33*  CALCIUM  --    < > 9.1 8.6* 8.2*  MG 1.7  --   --  1.7  --    < > = values in this interval not displayed.    Recent Labs    03/17/19 1811 09/16/19 2030 09/19/19 0814  AST 34 18 19  ALT 17 11 10   ALKPHOS 60 48 44  BILITOT 2.4* 1.1 0.8  PROT 6.7 6.5 6.1*  ALBUMIN 3.5 2.8* 2.5*   Recent Labs    09/16/19 2030 09/16/19 2030 09/17/19 0320 09/17/19 0320 09/18/19 0449 09/19/19 0814 11/29/19 0000  WBC 5.6   < > 7.2   < > 4.7 4.1 4.2  NEUTROABS 3.6  --   --   --   --  2.4 2  HGB 8.9*   < > 9.4*   < > 8.8* 8.4* 8.4*  HCT 28.6*   < > 30.2*   < > 27.2* 26.6* 24*  MCV 99.0   < > 97.4  --  96.5 97.4  --   PLT 158   < > 169   < > 167 178 142*   < > = values in this interval not displayed.   Lab Results  Component Value Date   TSH 0.923 05/31/2014   Lab Results  Component Value Date   HGBA1C 5.1 03/24/2019   Lab Results  Component Value Date   CHOL 185 03/19/2019   HDL 43 03/19/2019   LDLCALC 113 (H) 03/19/2019   TRIG 146 03/19/2019   CHOLHDL 4.3 03/19/2019    Assessment/Plan  1. Hypokalemia Lab Results  Component Value Date   K 2.9 (A) 01/07/2020   -Give Klor-Con 10 MEQ 4 capsules = 40 meq orally  Now and repeat BMP   2. Chronic heart failure with preserved ejection fraction (HCC) -  No SOB, continue Lasix 20 mg 1 tab daily on MWF, bisoprolol 10 mg 1 tab twice a day     Family/ staff Communication: Discussed plan of care with residents" nurse.   Labs/tests ordered: Repeat BMP   Goals of care:   Long-term care   Durenda Age, DNP, FNP-BC [facility] [phone] (Monday-Friday 8:00 a.m. -  5:00 p.m.) [phone] (after hours)

## 2020-01-12 ENCOUNTER — Encounter: Payer: Self-pay | Admitting: Adult Health

## 2020-01-12 ENCOUNTER — Non-Acute Institutional Stay (SKILLED_NURSING_FACILITY): Payer: Medicare Other | Admitting: Adult Health

## 2020-01-12 DIAGNOSIS — E538 Deficiency of other specified B group vitamins: Secondary | ICD-10-CM

## 2020-01-12 DIAGNOSIS — D508 Other iron deficiency anemias: Secondary | ICD-10-CM | POA: Diagnosis not present

## 2020-01-12 DIAGNOSIS — I10 Essential (primary) hypertension: Secondary | ICD-10-CM | POA: Diagnosis not present

## 2020-01-12 DIAGNOSIS — F0150 Vascular dementia without behavioral disturbance: Secondary | ICD-10-CM

## 2020-01-12 DIAGNOSIS — I5032 Chronic diastolic (congestive) heart failure: Secondary | ICD-10-CM | POA: Diagnosis not present

## 2020-01-12 DIAGNOSIS — E876 Hypokalemia: Secondary | ICD-10-CM

## 2020-01-12 NOTE — Progress Notes (Signed)
Location:  [place] Room Number: 126-A Place of Service:  SNF (31) Provider:  Durenda Age, DNP, FNP-BC  Patient Care Team: Hendricks Limes, MD as PCP - General (Internal Medicine) Medina-Vargas, Senaida Lange, NP as Nurse Practitioner (Internal Medicine)  Extended Emergency Contact Information Primary Emergency Contact: [name] Mobile Phone: [phone] Relation: Nephew Secondary Emergency Contact: Ernie Avena Mobile Phone: [phone] Relation: Relative  Code Status:  DNR  Goals of care: Advanced Directive information Advanced Directives 11/17/2019  Does Patient Have a Medical Advance Directive? Yes  Type of Advance Directive Out of facility DNR (pink MOST or yellow form)  Does patient want to make changes to medical advance directive? No - Patient declined  Copy of [place] in Chart? -  Would patient like information on creating a medical advance directive? -  Pre-existing out of facility DNR order (yellow form or pink MOST form) Yellow form placed in chart (order not valid for inpatient use)     Chief Complaint  Patient presents with  . Medical Management of Chronic Issues    Routine [facility] visit    HPI:  Pt is an 86 y.o. female seen today for medical management of chronic diseases.  She is a long-term care resident of [facility].  She has a PMH of congestive heart failure, chronic kidney disease stage III, hypertension, hyperlipidemia, aortic insufficiency and CAD. There was no reported SOB. She takes Furosemide 20 mg daily on MWFs for CHF. She takes Vitamin B12 injections monthly for Vitamin B12 deficiency. Latest B12 level is 481 (01/07/20). SBPs ranging from 120 to 150. She takes Amlodipine 5 mg daily for hypertension.   Past Medical History:  Diagnosis Date  . Anemia   . Aortic insufficiency    mild  . Arthritis    "arms" (08/09/2015)  . CAD (coronary artery disease)     cath 08/09/2014 95% stenosis in prox to mid RCA s/p DES, 80-90% prox OM2, 50% distal LAD  . CHF (congestive heart failure) ([place])   . CKD (chronic kidney disease) stage 3, GFR 30-59 ml/min   . Colon polyp    2009 colonoscopy, not retrieved for pathology  . Dyspnea   . Ectropion of left lower eyelid   . GERD (gastroesophageal reflux disease) 2009   EGD with benign gastric polyp too  . Hyperlipidemia   . Hypertension   . Pneumonia    history of  . Stroke ([place]) 1975  . Vitamin D deficiency    Past Surgical History:  Procedure Laterality Date  . ABDOMINAL HYSTERECTOMY    . APPENDECTOMY    . ARTERY BIOPSY Left 12/16/2012   Procedure: BIOPSY TEMPORAL ARTERY;  Surgeon: Mal Misty, MD;  Location: [place];  Service: Vascular;  Laterality: Left;  . CARDIAC CATHETERIZATION    . LEFT HEART CATHETERIZATION WITH CORONARY ANGIOGRAM N/A 08/09/2014   Procedure: LEFT HEART CATHETERIZATION WITH CORONARY ANGIOGRAM;  Surgeon: Peter M Martinique, MD;  Location: [facility] CATH LAB;  Service: Cardiovascular;  Laterality: N/A;  . PERCUTANEOUS CORONARY STENT INTERVENTION (PCI-S)  08/09/2014   Procedure: PERCUTANEOUS CORONARY STENT INTERVENTION (PCI-S);  Surgeon: Peter M Martinique, MD;  Location: [facility] CATH LAB;  Service: Cardiovascular;;  . TONSILLECTOMY      Allergies  Allergen Reactions  . Remeron [Mirtazapine]     Worsening of baseline AMS with frank encephalopathy    Outpatient Encounter Medications as of 01/12/2020  Medication Sig  . acetaminophen (TYLENOL) 325 MG tablet Take 650  mg by mouth every 6 (six) hours as needed for mild pain or headache.   . albuterol (VENTOLIN HFA) 108 (90 Base) MCG/ACT inhaler Inhale 1 puff into the lungs every 6 (six) hours as needed for wheezing or shortness of breath.  . Amino Acids-Protein Hydrolys (FEEDING SUPPLEMENT, PRO-STAT SUGAR FREE 64,) LIQD Take 30 mLs by mouth in the morning and at bedtime.  Marland Kitchen amLODipine (NORVASC) 5 MG tablet Take 5 mg by mouth daily.  Marland Kitchen aspirin EC 81 MG tablet  Take 1 tablet (81 mg total) by mouth daily.  Marland Kitchen atorvastatin (LIPITOR) 40 MG tablet Take 1 tablet (40 mg total) by mouth daily at 6 PM.  . bisacodyl (DULCOLAX) 10 MG suppository Place 10 mg rectally daily as needed (for constipation not relieved by Milk of Magnesia).   . bisoprolol (ZEBETA) 10 MG tablet Take 10 mg by mouth in the morning and at bedtime.  . cyanocobalamin (,VITAMIN B-12,) 1000 MCG/ML injection Inject 1,000 mcg into the muscle once.  . Eyelid Cleansers (OCUSOFT LID SCRUB PLUS) PADS Place 1 each into both eyes See admin instructions. CLEANSE BILATERAL EYELASHES ONCE DAILY DX: DRY EYES   . feeding supplement, ENSURE ENLIVE, (ENSURE ENLIVE) LIQD Take 237 mLs by mouth 2 (two) times daily between meals.  . ferrous sulfate 325 (65 FE) MG tablet Take 325 mg by mouth 2 (two) times daily with a meal.   . furosemide (LASIX) 20 MG tablet Take 20 mg by mouth every Monday, Wednesday, and Friday.  . magnesium hydroxide (MILK OF MAGNESIA) 400 MG/5ML suspension Take 30 mLs by mouth daily as needed for mild constipation.   . Nutritional Supplement LIQD Take 120 mLs by mouth in the morning, at noon, and at bedtime. MedPass  . Nutritional Supplements (NUTRITIONAL SUPPLEMENT PO) Take 1 each by mouth daily. Magic Cup  . pantoprazole sodium (PROTONIX) 40 mg/20 mL PACK Take 20 mLs (40 mg total) by mouth daily.  . polyvinyl alcohol (ARTIFICIAL TEARS) 1.4 % ophthalmic solution Place 1 drop into both eyes 2 (two) times daily. For dry eyes  . senna (SENOKOT) 8.6 MG TABS tablet Take 2 tablets by mouth in the morning and at bedtime.    No facility-administered encounter medications on file as of 01/12/2020.    Review of Systems  GENERAL: No fever or chills MOUTH and THROAT: Denies oral discomfort, gingival pain or bleeding RESPIRATORY: no cough, SOB, DOE, wheezing, hemoptysis CARDIAC: No chest pain, edema or palpitations GI: No abdominal pain, diarrhea, constipation, heart burn, nausea or vomiting GU:  Denies dysuria, frequency, hematuria or discharge NEUROLOGICAL: Denies dizziness, syncope, numbness, or headache PSYCHIATRIC: Denies feelings of depression or anxiety. No report of hallucinations, insomnia, paranoia, or agitation   Immunization History  Administered Date(s) Administered  . Influenza Whole 03/28/2008  . Influenza,inj,Quad PF,6+ Mos 08/09/2013, 03/30/2015, 03/24/2017  . Influenza-Unspecified 03/31/2019  . Moderna SARS-COVID-2 Vaccination 07/19/2019, 08/16/2019  . Pneumococcal Conjugate-13 12/08/2014  . Pneumococcal Polysaccharide-23 09/18/2012  . Tdap 02/01/2016, 06/21/2018   Pertinent  Health Maintenance Due  Topic Date Due  . INFLUENZA VACCINE  01/30/2020  . DEXA SCAN  Completed  . PNA vac Low Risk Adult  Completed   Fall Risk  03/19/2018 10/16/2017 09/26/2017 07/14/2017 04/21/2017  Falls in the past year? No Yes No No No  Number falls in past yr: - 1 - - -  Injury with Fall? - Yes - - -  Risk Factor Category  - High Fall Risk - - -  Risk for fall due to : -  Impaired mobility;Impaired balance/gait Impaired balance/gait;Impaired mobility - -  Follow up - Falls prevention discussed - - -  Comment - - - - -     Vitals:   01/12/20 1150  BP: (!) 120/58  Pulse: 76  Resp: 20  Temp: (!) 97.4 F (36.3 C)  TempSrc: Oral  Weight: 112 lb 3.2 oz (50.9 kg)  Height: 4\' 10"  (1.473 m)   Body mass index is 23.45 kg/m.  Physical Exam  GENERAL APPEARANCE:  In no acute distress. Normal body habitus SKIN:  Skin is warm and dry.  EYES:  Bilateral ectropion MOUTH and THROAT: Lips are without lesions. Oral mucosa is moist and without lesions. Tongue is normal in shape, size, and color and without lesions RESPIRATORY: Breathing is even & unlabored, BS CTAB CARDIAC: RRR, no murmur,no extra heart sounds, no edema GI: Abdomen soft, normal BS, no masses, no tenderness EXTREMITIES:  Able to move X 4 extremities. BLE with generalized weakness. NEUROLOGICAL: There is no tremor.  Speech is clear. Alert to self, disoriented to time and place. PSYCHIATRIC:  Affect and behavior are appropriate  Labs reviewed: Recent Labs    03/17/19 1922 03/19/19 0621 09/17/19 0320 09/17/19 0320 09/18/19 0449 09/19/19 0814 01/07/20 0000  NA  --    < > 143   < > 140 139 144  K  --    < > 3.9   < > 3.0* 4.1 2.9*  CL  --    < > 104   < > 103 110 104  CO2  --    < > 24   < > 23 22 29*  GLUCOSE  --    < > 113*  --  96 122*  --   BUN  --    < > 63*   < > 61* 47* 24*  CREATININE  --    < > 2.73*   < > 1.91* 1.33* 0.7  CALCIUM  --    < > 9.1   < > 8.6* 8.2* 8.2*  MG 1.7  --   --   --  1.7  --   --    < > = values in this interval not displayed.   Recent Labs    03/17/19 1811 09/16/19 2030 09/19/19 0814  AST 34 18 19  ALT 17 11 10   ALKPHOS 60 48 44  BILITOT 2.4* 1.1 0.8  PROT 6.7 6.5 6.1*  ALBUMIN 3.5 2.8* 2.5*   Recent Labs    09/17/19 0320 09/17/19 0320 09/18/19 0449 09/18/19 0449 09/19/19 0814 11/29/19 0000 01/07/20 0000  WBC 7.2   < > 4.7   < > 4.1 4.2 4.0  NEUTROABS  --   --   --   --  2.4 2 2   HGB 9.4*   < > 8.8*   < > 8.4* 8.4* 9.1*  HCT 30.2*   < > 27.2*   < > 26.6* 24* 26*  MCV 97.4  --  96.5  --  97.4  --   --   PLT 169   < > 167   < > 178 142* 159   < > = values in this interval not displayed.   Lab Results  Component Value Date   TSH 0.923 05/31/2014   Lab Results  Component Value Date   HGBA1C 5.1 03/24/2019   Lab Results  Component Value Date   CHOL 185 03/19/2019   HDL 43 03/19/2019   LDLCALC 113 (H) 03/19/2019   TRIG  146 03/19/2019   CHOLHDL 4.3 03/19/2019    Assessment/Plan  1. Chronic heart failure with preserved ejection fraction (HCC) -   Stable, no SOB, continue furosemide  2. Essential hypertension -   Stable, continue amlodipine and bisoprolol  3. Iron deficiency anemia secondary to inadequate dietary iron intake Lab Results  Component Value Date   HGB 9.1 (A) 01/07/2020   -Continue ferrous sulfate  4.  Hypokalemia Lab Results  Component Value Date   K 2.9 (A) 01/07/2020   - was supplemented with KCL ER 40 meq PO X 1  5. Vitamin B12 deficiency Lab Results  Component Value Date   VITAMINB12 481 01/07/2020   -  Continue Vitamin B12 injections  6. Vascular dementia without behavioral disturbance ([place]) -  Continue supportive care and fall precautions     Family/ staff Communication: Discussed resident and charge nurse.  Labs/tests ordered: None  Goals of care:   Long-term care   Durenda Age, DNP, FNP-BC [facility] [phone] (Monday-Friday 8:00 a.m. - 5:00 p.m.) [phone] (after hours)

## 2020-01-25 DIAGNOSIS — R0989 Other specified symptoms and signs involving the circulatory and respiratory systems: Secondary | ICD-10-CM | POA: Diagnosis not present

## 2020-01-25 DIAGNOSIS — R05 Cough: Secondary | ICD-10-CM | POA: Diagnosis not present

## 2020-01-26 ENCOUNTER — Emergency Department (HOSPITAL_COMMUNITY): Payer: Medicare Other

## 2020-01-26 ENCOUNTER — Inpatient Hospital Stay (HOSPITAL_COMMUNITY)
Admission: EM | Admit: 2020-01-26 | Discharge: 2020-01-30 | DRG: 951 | Disposition: E | Payer: Medicare Other | Attending: Internal Medicine | Admitting: Internal Medicine

## 2020-01-26 DIAGNOSIS — F0390 Unspecified dementia without behavioral disturbance: Secondary | ICD-10-CM | POA: Diagnosis present

## 2020-01-26 DIAGNOSIS — E876 Hypokalemia: Secondary | ICD-10-CM | POA: Diagnosis present

## 2020-01-26 DIAGNOSIS — A419 Sepsis, unspecified organism: Secondary | ICD-10-CM | POA: Diagnosis present

## 2020-01-26 DIAGNOSIS — I517 Cardiomegaly: Secondary | ICD-10-CM | POA: Diagnosis not present

## 2020-01-26 DIAGNOSIS — Z66 Do not resuscitate: Secondary | ICD-10-CM | POA: Diagnosis present

## 2020-01-26 DIAGNOSIS — R7401 Elevation of levels of liver transaminase levels: Secondary | ICD-10-CM | POA: Diagnosis present

## 2020-01-26 DIAGNOSIS — G9341 Metabolic encephalopathy: Secondary | ICD-10-CM | POA: Diagnosis present

## 2020-01-26 DIAGNOSIS — R0902 Hypoxemia: Secondary | ICD-10-CM | POA: Diagnosis not present

## 2020-01-26 DIAGNOSIS — M1990 Unspecified osteoarthritis, unspecified site: Secondary | ICD-10-CM | POA: Diagnosis present

## 2020-01-26 DIAGNOSIS — I160 Hypertensive urgency: Secondary | ICD-10-CM | POA: Diagnosis present

## 2020-01-26 DIAGNOSIS — E559 Vitamin D deficiency, unspecified: Secondary | ICD-10-CM | POA: Diagnosis present

## 2020-01-26 DIAGNOSIS — Z8616 Personal history of COVID-19: Secondary | ICD-10-CM | POA: Diagnosis present

## 2020-01-26 DIAGNOSIS — N189 Chronic kidney disease, unspecified: Secondary | ICD-10-CM | POA: Diagnosis present

## 2020-01-26 DIAGNOSIS — J690 Pneumonitis due to inhalation of food and vomit: Secondary | ICD-10-CM | POA: Diagnosis present

## 2020-01-26 DIAGNOSIS — I2510 Atherosclerotic heart disease of native coronary artery without angina pectoris: Secondary | ICD-10-CM | POA: Diagnosis present

## 2020-01-26 DIAGNOSIS — Z87891 Personal history of nicotine dependence: Secondary | ICD-10-CM

## 2020-01-26 DIAGNOSIS — Z7982 Long term (current) use of aspirin: Secondary | ICD-10-CM

## 2020-01-26 DIAGNOSIS — R0603 Acute respiratory distress: Secondary | ICD-10-CM

## 2020-01-26 DIAGNOSIS — Z8673 Personal history of transient ischemic attack (TIA), and cerebral infarction without residual deficits: Secondary | ICD-10-CM

## 2020-01-26 DIAGNOSIS — Z90710 Acquired absence of both cervix and uterus: Secondary | ICD-10-CM

## 2020-01-26 DIAGNOSIS — Z9181 History of falling: Secondary | ICD-10-CM

## 2020-01-26 DIAGNOSIS — J189 Pneumonia, unspecified organism: Secondary | ICD-10-CM | POA: Diagnosis not present

## 2020-01-26 DIAGNOSIS — I130 Hypertensive heart and chronic kidney disease with heart failure and stage 1 through stage 4 chronic kidney disease, or unspecified chronic kidney disease: Secondary | ICD-10-CM | POA: Diagnosis present

## 2020-01-26 DIAGNOSIS — Z833 Family history of diabetes mellitus: Secondary | ICD-10-CM

## 2020-01-26 DIAGNOSIS — I5032 Chronic diastolic (congestive) heart failure: Secondary | ICD-10-CM | POA: Diagnosis present

## 2020-01-26 DIAGNOSIS — Z515 Encounter for palliative care: Secondary | ICD-10-CM | POA: Diagnosis present

## 2020-01-26 DIAGNOSIS — E872 Acidosis: Secondary | ICD-10-CM | POA: Diagnosis present

## 2020-01-26 DIAGNOSIS — R0689 Other abnormalities of breathing: Secondary | ICD-10-CM | POA: Diagnosis not present

## 2020-01-26 DIAGNOSIS — E785 Hyperlipidemia, unspecified: Secondary | ICD-10-CM | POA: Diagnosis present

## 2020-01-26 DIAGNOSIS — I351 Nonrheumatic aortic (valve) insufficiency: Secondary | ICD-10-CM | POA: Diagnosis present

## 2020-01-26 DIAGNOSIS — N1832 Chronic kidney disease, stage 3b: Secondary | ICD-10-CM | POA: Diagnosis present

## 2020-01-26 DIAGNOSIS — R0602 Shortness of breath: Secondary | ICD-10-CM | POA: Diagnosis not present

## 2020-01-26 DIAGNOSIS — Z823 Family history of stroke: Secondary | ICD-10-CM

## 2020-01-26 DIAGNOSIS — Z8249 Family history of ischemic heart disease and other diseases of the circulatory system: Secondary | ICD-10-CM

## 2020-01-26 DIAGNOSIS — Z79899 Other long term (current) drug therapy: Secondary | ICD-10-CM

## 2020-01-26 DIAGNOSIS — Z8719 Personal history of other diseases of the digestive system: Secondary | ICD-10-CM

## 2020-01-26 DIAGNOSIS — Z955 Presence of coronary angioplasty implant and graft: Secondary | ICD-10-CM

## 2020-01-26 DIAGNOSIS — J9601 Acute respiratory failure with hypoxia: Secondary | ICD-10-CM | POA: Diagnosis present

## 2020-01-26 DIAGNOSIS — K219 Gastro-esophageal reflux disease without esophagitis: Secondary | ICD-10-CM | POA: Diagnosis present

## 2020-01-26 DIAGNOSIS — I10 Essential (primary) hypertension: Secondary | ICD-10-CM | POA: Diagnosis present

## 2020-01-26 DIAGNOSIS — N179 Acute kidney failure, unspecified: Secondary | ICD-10-CM | POA: Diagnosis present

## 2020-01-26 LAB — COMPREHENSIVE METABOLIC PANEL
ALT: 37 U/L (ref 0–44)
AST: 354 U/L — ABNORMAL HIGH (ref 15–41)
Albumin: 2.8 g/dL — ABNORMAL LOW (ref 3.5–5.0)
Alkaline Phosphatase: 67 U/L (ref 38–126)
Anion gap: 16 — ABNORMAL HIGH (ref 5–15)
BUN: 33 mg/dL — ABNORMAL HIGH (ref 8–23)
CO2: 30 mmol/L (ref 22–32)
Calcium: 9 mg/dL (ref 8.9–10.3)
Chloride: 99 mmol/L (ref 98–111)
Creatinine, Ser: 1.61 mg/dL — ABNORMAL HIGH (ref 0.44–1.00)
GFR calc Af Amer: 33 mL/min — ABNORMAL LOW (ref >60)
GFR calc non Af Amer: 28 mL/min — ABNORMAL LOW (ref >60)
Glucose, Bld: 154 mg/dL — ABNORMAL HIGH (ref 70–99)
Potassium: 2.9 mmol/L — ABNORMAL LOW (ref 3.5–5.1)
Sodium: 145 mmol/L (ref 135–145)
Total Bilirubin: 0.8 mg/dL (ref 0.3–1.2)
Total Protein: 7.4 g/dL (ref 6.5–8.1)

## 2020-01-26 LAB — HIV ANTIBODY (ROUTINE TESTING W REFLEX): HIV Screen 4th Generation wRfx: NONREACTIVE

## 2020-01-26 LAB — I-STAT VENOUS BLOOD GAS, ED
Acid-Base Excess: 7 mmol/L — ABNORMAL HIGH (ref 0.0–2.0)
Bicarbonate: 32.4 mmol/L — ABNORMAL HIGH (ref 20.0–28.0)
Calcium, Ion: 1.06 mmol/L — ABNORMAL LOW (ref 1.15–1.40)
HCT: 33 % — ABNORMAL LOW (ref 36.0–46.0)
Hemoglobin: 11.2 g/dL — ABNORMAL LOW (ref 12.0–15.0)
O2 Saturation: 43 %
Patient temperature: 98.2
Potassium: 2.8 mmol/L — ABNORMAL LOW (ref 3.5–5.1)
Sodium: 146 mmol/L — ABNORMAL HIGH (ref 135–145)
TCO2: 34 mmol/L — ABNORMAL HIGH (ref 22–32)
pCO2, Ven: 48.4 mmHg (ref 44.0–60.0)
pH, Ven: 7.432 — ABNORMAL HIGH (ref 7.250–7.430)
pO2, Ven: 24 mmHg — CL (ref 32.0–45.0)

## 2020-01-26 LAB — CBC WITH DIFFERENTIAL/PLATELET
Abs Immature Granulocytes: 0.03 10*3/uL (ref 0.00–0.07)
Basophils Absolute: 0 10*3/uL (ref 0.0–0.1)
Basophils Relative: 0 %
Eosinophils Absolute: 0 10*3/uL (ref 0.0–0.5)
Eosinophils Relative: 0 %
HCT: 37.8 % (ref 36.0–46.0)
Hemoglobin: 12 g/dL (ref 12.0–15.0)
Immature Granulocytes: 0 %
Lymphocytes Relative: 19 %
Lymphs Abs: 1.5 10*3/uL (ref 0.7–4.0)
MCH: 30.6 pg (ref 26.0–34.0)
MCHC: 31.7 g/dL (ref 30.0–36.0)
MCV: 96.4 fL (ref 80.0–100.0)
Monocytes Absolute: 0.5 10*3/uL (ref 0.1–1.0)
Monocytes Relative: 6 %
Neutro Abs: 6.1 10*3/uL (ref 1.7–7.7)
Neutrophils Relative %: 75 %
Platelets: 224 10*3/uL (ref 150–400)
RBC: 3.92 MIL/uL (ref 3.87–5.11)
RDW: 14.2 % (ref 11.5–15.5)
WBC: 8.2 10*3/uL (ref 4.0–10.5)
nRBC: 0 % (ref 0.0–0.2)

## 2020-01-26 LAB — LACTIC ACID, PLASMA
Lactic Acid, Venous: 3.9 mmol/L (ref 0.5–1.9)
Lactic Acid, Venous: 2.7 mmol/L (ref 0.5–1.9)
Lactic Acid, Venous: 4.3 mmol/L (ref 0.5–1.9)

## 2020-01-26 LAB — SARS CORONAVIRUS 2 BY RT PCR (HOSPITAL ORDER, PERFORMED IN ~~LOC~~ HOSPITAL LAB): SARS Coronavirus 2: NEGATIVE

## 2020-01-26 MED ORDER — POLYVINYL ALCOHOL 1.4 % OP SOLN
1.0000 [drp] | Freq: Two times a day (BID) | OPHTHALMIC | Status: DC
  Administered 2020-01-26 – 2020-01-28 (×3): 1 [drp] via OPHTHALMIC
  Filled 2020-01-26: qty 15

## 2020-01-26 MED ORDER — MORPHINE SULFATE (PF) 2 MG/ML IV SOLN
1.0000 mg | INTRAVENOUS | Status: DC | PRN
  Administered 2020-01-27: 1 mg via INTRAVENOUS
  Administered 2020-01-28: 2 mg via INTRAVENOUS
  Filled 2020-01-26 (×2): qty 1

## 2020-01-26 MED ORDER — OCUSOFT LID SCRUB PLUS EX PADS: 1.0000 | MEDICATED_PAD | CUTANEOUS | Status: DC

## 2020-01-26 MED ORDER — SODIUM CHLORIDE 0.9 % IV SOLN: 500.0000 mg | INTRAVENOUS | Status: DC

## 2020-01-26 MED ORDER — SODIUM CHLORIDE 0.9 % IV SOLN
500.0000 mg | Freq: Once | INTRAVENOUS | Status: AC
  Administered 2020-01-26: 500 mg via INTRAVENOUS
  Filled 2020-01-26: qty 500

## 2020-01-26 MED ORDER — MORPHINE SULFATE (PF) 4 MG/ML IV SOLN: 4.0000 mg | INTRAVENOUS | Status: DC | PRN

## 2020-01-26 MED ORDER — GLYCOPYRROLATE 0.2 MG/ML IJ SOLN
0.4000 mg | INTRAMUSCULAR | Status: DC
  Administered 2020-01-26 – 2020-01-28 (×11): 0.4 mg via INTRAVENOUS
  Filled 2020-01-26 (×11): qty 2

## 2020-01-26 MED ORDER — ONDANSETRON HCL 4 MG/2ML IJ SOLN: 4.0000 mg | Freq: Four times a day (QID) | INTRAMUSCULAR | Status: DC | PRN

## 2020-01-26 MED ORDER — LORAZEPAM 2 MG/ML IJ SOLN
0.5000 mg | INTRAMUSCULAR | Status: DC
  Administered 2020-01-26 – 2020-01-28 (×19): 1 mg via INTRAVENOUS
  Filled 2020-01-26 (×19): qty 1

## 2020-01-26 MED ORDER — POTASSIUM CHLORIDE 10 MEQ/100ML IV SOLN
10.0000 meq | INTRAVENOUS | Status: DC
  Administered 2020-01-26 (×2): 10 meq via INTRAVENOUS
  Filled 2020-01-26 (×2): qty 100

## 2020-01-26 MED ORDER — IPRATROPIUM-ALBUTEROL 0.5-2.5 (3) MG/3ML IN SOLN
3.0000 mL | Freq: Four times a day (QID) | RESPIRATORY_TRACT | Status: DC
  Administered 2020-01-26: 3 mL via RESPIRATORY_TRACT
  Filled 2020-01-26 (×4): qty 3

## 2020-01-26 MED ORDER — ENOXAPARIN SODIUM 30 MG/0.3ML ~~LOC~~ SOLN
30.0000 mg | SUBCUTANEOUS | Status: DC
  Administered 2020-01-26: 30 mg via SUBCUTANEOUS
  Filled 2020-01-26: qty 0.3

## 2020-01-26 MED ORDER — MORPHINE SULFATE (PF) 4 MG/ML IV SOLN
4.0000 mg | Freq: Once | INTRAVENOUS | Status: AC
  Administered 2020-01-26: 4 mg via INTRAVENOUS
  Filled 2020-01-26 (×2): qty 1

## 2020-01-26 MED ORDER — POLYVINYL ALCOHOL 1.4 % OP SOLN
1.0000 [drp] | OPHTHALMIC | Status: DC | PRN
  Filled 2020-01-26: qty 15

## 2020-01-26 MED ORDER — ALBUTEROL SULFATE (2.5 MG/3ML) 0.083% IN NEBU
2.5000 mg | INHALATION_SOLUTION | Freq: Once | RESPIRATORY_TRACT | Status: AC
  Administered 2020-01-26: 2.5 mg via RESPIRATORY_TRACT
  Filled 2020-01-26: qty 3

## 2020-01-26 MED ORDER — POTASSIUM CHLORIDE 10 MEQ/100ML IV SOLN: 10.0000 meq | INTRAVENOUS | Status: DC

## 2020-01-26 MED ORDER — ACETAMINOPHEN 500 MG PO TABS: 500.0000 mg | ORAL_TABLET | Freq: Four times a day (QID) | ORAL | Status: DC | PRN

## 2020-01-26 MED ORDER — HYDRALAZINE HCL 20 MG/ML IJ SOLN: 10.0000 mg | INTRAMUSCULAR | Status: DC | PRN

## 2020-01-26 MED ORDER — POTASSIUM CHLORIDE 10 MEQ/100ML IV SOLN
10.0000 meq | INTRAVENOUS | Status: AC
  Administered 2020-01-26 (×2): 10 meq via INTRAVENOUS
  Filled 2020-01-26: qty 100

## 2020-01-26 MED ORDER — DIPHENHYDRAMINE HCL 25 MG PO CAPS: 25.0000 mg | ORAL_CAPSULE | Freq: Four times a day (QID) | ORAL | Status: DC | PRN

## 2020-01-26 MED ORDER — LACTATED RINGERS IV BOLUS
1500.0000 mL | Freq: Once | INTRAVENOUS | Status: AC
  Administered 2020-01-26: 1500 mL via INTRAVENOUS

## 2020-01-26 MED ORDER — FENTANYL CITRATE (PF) 100 MCG/2ML IJ SOLN: 12.0000 ug | INTRAMUSCULAR | Status: DC | PRN

## 2020-01-26 MED ORDER — SODIUM CHLORIDE 0.9 % IV SOLN: INTRAVENOUS | Status: DC

## 2020-01-26 MED ORDER — BIOTENE DRY MOUTH MT LIQD: 15.0000 mL | OROMUCOSAL | Status: DC | PRN

## 2020-01-26 MED ORDER — SODIUM CHLORIDE 0.9 % IV SOLN
2.0000 g | Freq: Once | INTRAVENOUS | Status: AC
  Administered 2020-01-26: 2 g via INTRAVENOUS
  Filled 2020-01-26: qty 20

## 2020-01-26 MED ORDER — DIPHENHYDRAMINE HCL 50 MG/ML IJ SOLN: 25.0000 mg | Freq: Four times a day (QID) | INTRAMUSCULAR | Status: DC | PRN

## 2020-01-26 MED ORDER — POTASSIUM CHLORIDE 10 MEQ/100ML IV SOLN
10.0000 meq | INTRAVENOUS | Status: AC
  Administered 2020-01-26: 10 meq via INTRAVENOUS
  Filled 2020-01-26 (×2): qty 100

## 2020-01-26 MED ORDER — SODIUM CHLORIDE 0.9 % IV SOLN: 2.0000 g | INTRAVENOUS | Status: DC

## 2020-01-26 MED ORDER — IPRATROPIUM-ALBUTEROL 0.5-2.5 (3) MG/3ML IN SOLN
RESPIRATORY_TRACT | Status: AC
  Administered 2020-01-26: 3 mL via RESPIRATORY_TRACT
  Filled 2020-01-26: qty 3

## 2020-01-26 MED ORDER — MORPHINE SULFATE (PF) 2 MG/ML IV SOLN
2.0000 mg | Freq: Once | INTRAVENOUS | Status: AC
  Administered 2020-01-26: 2 mg via INTRAVENOUS
  Filled 2020-01-26: qty 1

## 2020-01-26 NOTE — Progress Notes (Signed)
Pt arrived on unit with family at bedside Rt placed pt on heated high flow 20 L [place] and comfort measures in placed. V/S 161/ 139/77, HR 90, P 89, O2 91-94%.

## 2020-01-26 NOTE — ED Provider Notes (Signed)
[facility] EMERGENCY DEPARTMENT Provider Note   CSN: [id number] Arrival date & time: 01/20/2020  0139   History Chief Complaint  Patient presents with  . Respiratory Distress    Jane [name] is a 86 y.o. female.  The history is provided by the nursing home and the EMS personnel. The history is limited by the condition of the patient (Respiratory distress).  She has history of hypertension, hyperlipidemia, chronic kidney disease, heart failure, stroke and was transferred from skilled nursing facility because of respiratory distress.  She does have DNR paperwork with her.  She apparently has a recent diagnosis of pneumonia.  Initial oxygen saturation was 66% while on oxygen at 2 L/min, which improved when she was placed on a nonrebreather mask.  She vomited shortly after arrival in the ED.  Past Medical History:  Diagnosis Date  . Anemia   . Aortic insufficiency    mild  . Arthritis    "arms" (08/09/2015)  . CAD (coronary artery disease)    cath 08/09/2014 95% stenosis in prox to mid RCA s/p DES, 80-90% prox OM2, 50% distal LAD  . CHF (congestive heart failure) ([place])   . CKD (chronic kidney disease) stage 3, GFR 30-59 ml/min   . Colon polyp    2009 colonoscopy, not retrieved for pathology  . Dyspnea   . Ectropion of left lower eyelid   . GERD (gastroesophageal reflux disease) 2009   EGD with benign gastric polyp too  . Hyperlipidemia   . Hypertension   . Pneumonia    history of  . Stroke ([place]) 1975  . Vitamin D deficiency     Patient Active Problem List   Diagnosis Date Noted  . Facial laceration   . Protein-calorie malnutrition, severe 09/17/2019  . SDH (subdural hematoma) ([place]) 09/16/2019  . History of colon polyps 09/07/2019  . Dry eyes 07/01/2019  . Poor appetite 06/27/2019  . Weight loss 06/27/2019  . Advance care planning 06/17/2019  . Acute renal failure superimposed on stage 3 chronic kidney disease ([place]) 03/29/2019  . Thrombocytopenia ([place])  03/29/2019  . E coli bacteremia 03/29/2019  . IVH (intraventricular hemorrhage) ([place]) 03/19/2019  . Cytotoxic brain edema ([place]) 03/19/2019  . Pressure injury of skin 03/18/2019  . Stroke (cerebrum) ([place]) 03/17/2019  . Urge incontinence 11/18/2018  . Acute exacerbation of CHF (congestive heart failure) ([place]) 10/20/2018  . Acute on chronic heart failure with preserved ejection fraction (HFpEF) ([place]) 07/25/2018  . Hypokalemia 07/25/2018  . Cellulitis of left lower extremity   . Toenail deformity 03/19/2018  . Skin lesion of scalp 03/19/2018  . Coronary artery disease involving native coronary artery of native heart with angina pectoris ([place]) 08/25/2017  . Unsteady gait 08/11/2016  . Abdominal aortic aneurysm ([place]) 05/21/2016  . Trochanteric bursitis of left hip   . Pancytopenia ([place]) 12/27/2015  . CKD (chronic kidney disease) 03/31/2015  . Prediabetes 10/16/2014  . Status post insertion of drug-eluting stent into right coronary artery for coronary artery disease 08/07/2014  . SOB (shortness of breath) 05/31/2014  . Physical deconditioning 05/31/2014  . Heart failure with preserved ejection fraction ([place]) 12/27/2012  . Ectropion of left lower eyelid 12/17/2012  . Onychomycosis of toenail 09/18/2012  . PAD (peripheral artery disease) ([place]) 09/18/2012  . Preventative health care 09/18/2012  . Vitamin D deficiency 04/25/2008  . History of cerebrovascular accident 10/29/2007  . Hyperlipidemia 09/24/2006  . Anemia, unspecified 09/24/2006  . Hypertension 09/24/2006  . OSTEOPENIA 09/24/2006  . Vitamin B12 deficiency  08/29/2006    Past Surgical History:  Procedure Laterality Date  . ABDOMINAL HYSTERECTOMY    . APPENDECTOMY    . ARTERY BIOPSY Left 12/16/2012   Procedure: BIOPSY TEMPORAL ARTERY;  Surgeon: Mal Misty, MD;  Location: [facility];  Service: Vascular;  Laterality: Left;  . CARDIAC CATHETERIZATION    . LEFT HEART CATHETERIZATION WITH CORONARY ANGIOGRAM N/A 08/09/2014    Procedure: LEFT HEART CATHETERIZATION WITH CORONARY ANGIOGRAM;  Surgeon: Peter M Martinique, MD;  Location: [facility] CATH LAB;  Service: Cardiovascular;  Laterality: N/A;  . PERCUTANEOUS CORONARY STENT INTERVENTION (PCI-S)  08/09/2014   Procedure: PERCUTANEOUS CORONARY STENT INTERVENTION (PCI-S);  Surgeon: Peter M Martinique, MD;  Location: [facility] CATH LAB;  Service: Cardiovascular;;  . TONSILLECTOMY       OB History   No obstetric history on file.     Family History  Problem Relation Age of Onset  . Stroke Mother   . Hypertension Mother   . Stroke Father   . Hypertension Father   . Diabetes Sister     Social History   Tobacco Use  . Smoking status: Former Smoker    Types: Cigarettes    Quit date: 07/02/1979    Years since quitting: 40.5  . Smokeless tobacco: Former Systems developer  . Tobacco comment: Started in teenage years - Quit 1980  Vaping Use  . Vaping Use: Never used  Substance Use Topics  . Alcohol use: No    Alcohol/week: 0.0 standard drinks    Comment: Quit alcohol 1975-76  . Drug use: No    Home Medications Prior to Admission medications   Medication Sig Start Date End Date Taking? Authorizing Provider  acetaminophen (TYLENOL) 325 MG tablet Take 650 mg by mouth every 6 (six) hours as needed for mild pain or headache.     [provider]  albuterol (VENTOLIN HFA) 108 (90 Base) MCG/ACT inhaler Inhale 1 puff into the lungs every 6 (six) hours as needed for wheezing or shortness of breath.    [provider]  Amino Acids-Protein Hydrolys (FEEDING SUPPLEMENT, PRO-STAT SUGAR FREE 64,) LIQD Take 30 mLs by mouth in the morning and at bedtime.    [provider]  amLODipine (NORVASC) 5 MG tablet Take 5 mg by mouth daily.    [provider]  aspirin EC 81 MG tablet Take 1 tablet (81 mg total) by mouth daily. 11/18/19   Frann Rider, NP  atorvastatin (LIPITOR) 40 MG tablet Take 1 tablet (40 mg total) by mouth daily at 6 PM. 09/18/18   [place] Lewis C, PA-C  bisacodyl  (DULCOLAX) 10 MG suppository Place 10 mg rectally daily as needed (for constipation not relieved by Milk of Magnesia).     [provider]  bisoprolol (ZEBETA) 10 MG tablet Take 10 mg by mouth in the morning and at bedtime.    [provider]  cyanocobalamin (,VITAMIN B-12,) 1000 MCG/ML injection Inject 1,000 mcg into the muscle once.    [provider]  Eyelid Cleansers (OCUSOFT LID SCRUB PLUS) PADS Place 1 each into both eyes See admin instructions. CLEANSE BILATERAL EYELASHES ONCE DAILY DX: DRY EYES     [provider]  feeding supplement, ENSURE ENLIVE, (ENSURE ENLIVE) LIQD Take 237 mLs by mouth 2 (two) times daily between meals. 03/27/19   Hosie Poisson, MD  ferrous sulfate 325 (65 FE) MG tablet Take 325 mg by mouth 2 (two) times daily with a meal.     [provider]  furosemide (LASIX) 20  MG tablet Take 20 mg by mouth every Monday, Wednesday, and Friday.    [provider]  magnesium hydroxide (MILK OF MAGNESIA) 400 MG/5ML suspension Take 30 mLs by mouth daily as needed for mild constipation.     [provider]  Nutritional Supplement LIQD Take 120 mLs by mouth in the morning, at noon, and at bedtime. MedPass    [provider]  Nutritional Supplements (NUTRITIONAL SUPPLEMENT PO) Take 1 each by mouth daily. Magic Cup    [provider]  pantoprazole sodium (PROTONIX) 40 mg/20 mL PACK Take 20 mLs (40 mg total) by mouth daily. 03/28/19   Hosie Poisson, MD  polyvinyl alcohol (ARTIFICIAL TEARS) 1.4 % ophthalmic solution Place 1 drop into both eyes 2 (two) times daily. For dry eyes 09/18/18   [place] Lewis C, PA-C  senna (SENOKOT) 8.6 MG TABS tablet Take 2 tablets by mouth in the morning and at bedtime.     [provider]    Allergies    Remeron [mirtazapine]  Review of Systems   Review of Systems  Unable to perform ROS: Severe respiratory distress    Physical Exam Updated Vital Signs BP (!) 135/106 (BP  Location: Left Arm)   Pulse 102   Temp 98.2 F (36.8 C) (Axillary)   Resp (!) 28   Ht 4\' 10"  (1.473 m)   Wt 50 kg   SpO2 (!) 88%   BMI 23.04 kg/m   Physical Exam Vitals and nursing note reviewed.   86 year old female, resting comfortably and in moderate respiratory distress. Vital signs are significant for elevated diastolic blood pressure and borderline elevated heart rate and significantly elevated respiratory rate. Oxygen saturation is 88%, which is hypoxic. Head is normocephalic and atraumatic. PERRLA, EOMI. Oropharynx is clear. Neck is nontender and supple without adenopathy or JVD. Back is nontender and there is no CVA tenderness. Lungs have diffuse expiratory rhonchi. Chest is nontender.  Heart has regular rate and rhythm without murmur. Abdomen is soft, flat, nontender without masses or hepatosplenomegaly and peristalsis is normoactive. Extremities have no cyanosis or edema, full range of motion is present. Skin is warm and dry without rash. Neurologic: Awake but poorly responsive, will not answer questions, cranial nerves are intact, moves all extremities equally.  ED Results / Procedures / Treatments   Labs (all labs ordered are listed, but only abnormal results are displayed) Labs Reviewed  LACTIC ACID, PLASMA - Abnormal; Notable for the following components:      Result Value   Lactic Acid, Venous 3.9 (*)    All other components within normal limits  LACTIC ACID, PLASMA - Abnormal; Notable for the following components:   Lactic Acid, Venous 2.7 (*)    All other components within normal limits  COMPREHENSIVE METABOLIC PANEL - Abnormal; Notable for the following components:   Potassium 2.9 (*)    Glucose, Bld 154 (*)    BUN 33 (*)    Creatinine, Ser 1.61 (*)    Albumin 2.8 (*)    AST 354 (*)    GFR calc non Af Amer 28 (*)    GFR calc Af Amer 33 (*)    Anion gap 16 (*)    All other components within normal limits  CULTURE, BLOOD (ROUTINE X 2)  CULTURE, BLOOD  (ROUTINE X 2)  SARS CORONAVIRUS 2 BY RT PCR (HOSPITAL ORDER, [facility])  CBC WITH DIFFERENTIAL/PLATELET   Radiology DG Chest Port 1 View  Result Date: 01/18/2020  CLINICAL DATA:  Hypoxia EXAM: PORTABLE CHEST 1 VIEW COMPARISON:  March 26, 2019 FINDINGS: The heart size and mediastinal contours are within normal limits. Again noted is moderate cardiomegaly. Diffusely increased interstitial markings seen throughout both lungs. There is patchy airspace opacity seen within the right lung base and retrocardiac region. No acute osseous abnormality. IMPRESSION: Multifocal interstitial patchy airspace opacities throughout both lungs which could be due to multifocal pneumonia. Electronically Signed   By: Prudencio Pair M.D.   On: 01/06/2020 02:22    Procedures Procedures  CRITICAL CARE Performed by: Delora Fuel Total critical care time: 55 minutes Critical care time was exclusive of separately billable procedures and treating other patients. Critical care was necessary to treat or prevent imminent or life-threatening deterioration. Critical care was time spent personally by me on the following activities: development of treatment plan with patient and/or surrogate as well as nursing, discussions with consultants, evaluation of patient's response to treatment, examination of patient, obtaining history from patient or surrogate, ordering and performing treatments and interventions, ordering and review of laboratory studies, ordering and review of radiographic studies, pulse oximetry and re-evaluation of patient's condition.  Medications Ordered in ED Medications  azithromycin (ZITHROMAX) 500 mg in sodium chloride 0.9 % 250 mL IVPB (500 mg Intravenous New Bag/Given 12/30/2019 0555)  morphine 4 MG/ML injection 4 mg (4 mg Intravenous Not Given 01/17/2020 0606)  potassium chloride 10 mEq in 100 mL IVPB (has no administration in time range)  lactated ringers bolus 1,500 mL (1,500 mLs  Intravenous New Bag/Given 01/19/2020 0511)  cefTRIAXone (ROCEPHIN) 2 g in sodium chloride 0.9 % 100 mL IVPB (0 g Intravenous Stopped 01/27/2020 0552)  albuterol (PROVENTIL) (2.5 MG/3ML) 0.083% nebulizer solution 2.5 mg (2.5 mg Nebulization Given 01/03/2020 7371)    ED Course  I have reviewed the triage vital signs and the nursing notes.  Pertinent labs & imaging results that were available during my care of the patient were reviewed by me and considered in my medical decision making (see chart for details).  MDM Rules/Calculators/A&P Patient arrived in respiratory distress.  Oxygen saturation was reported to get up to 95% with a nonrebreather mask.  However, she vomited almost immediately on arriving in the ED, and oxygen saturations are now in the upper 80s.  Given DNR status, she is given full supportive care.  However, with emesis, she is not a candidate for BiPAP.  Will check chest x-ray and screening labs.  Chest x-ray is consistent with multifocal pneumonia.  She is started on antibiotics for pneumonia.  Initial lactic acid level is elevated at 3.9.  She was given early, goal-directed fluid therapy with partial clearing of lactic acid, repeat is 2.7.  Labs also show evidence of an acute kidney injury and hypokalemia as well is elevation of AST.  She is given intravenous potassium as she is unable to take oral medication.  Oxygen saturations have been dropping in spite of maintaining her on a nonrebreather mask.  She is currently only able to get oxygen saturations up to the low 80s and is complaining of air hunger.  She was offered a dose of morphine, but has refused.  Case is discussed with Dr. Hal Hope of [facility] hospitalist, who agrees to admit the patient.  Final Clinical Impression(s) / ED Diagnoses Final diagnoses:  Respiratory distress  Multifocal pneumonia  Hypokalemia  Acute kidney injury (nontraumatic) (HCC)  Elevated AST (SGOT)    Rx / DC Orders ED Discharge Orders    None  Delora Fuel, MD 15/37/94 [phone]

## 2020-01-26 NOTE — ED Notes (Signed)
Dr Roxanne Mins aware Lactic 3.9

## 2020-01-26 NOTE — ED Notes (Signed)
Report given to moe on  2w

## 2020-01-26 NOTE — Progress Notes (Signed)
Late entry: Notified provider of need to order repeat lactic acid as third one was higher than the second.

## 2020-01-26 NOTE — H&P (Signed)
History and Physical    Jane [name] QKM:[id number] DOB: 06/07/1929 DOA: 01/23/2020  Referring MD/NP/PA: Gean Birchwood, MD PCP: Hendricks Limes, MD  Patient coming from: [facility] via EMS  Chief Complaint: Respiratory distress  I have personally briefly reviewed patient's old medical records in [place]   HPI: Jane [name] is a 86 y.o. female with medical history significant of dementia, hypertension, hyperlipidemia, diastolic congestive heart failure last EF 60- 65%, subdural hemorrhage, and COVID-19 in 08/2019 presented after EMS was called for patient having respiratory distress.  History is limited from the patient due to respiratory distress and obtained from review of records.  It appeared that she had been recently diagnosed with pneumonia.  Upon EMS arrival O2 saturations were 66% while on 2 L of nasal cannula oxygen with some improvement with nonrebreather mask.  ED Course: Upon admission into the emergency department patient was seen to be afebrile, pulse 93-1 04, respirations 28-36, blood pressures elevated up to 191/101, and O2 saturations as low as 66% with improvement after being placed on nonrebreather mask.  Labs significant for WBC 8.2, hemoglobin 12, potassium 2.9, BUN 33, creatinine 1.61, glucose 1 5064, and lactic acid 3.9-> 2.7.  Chest x-ray showed a multifocal pneumonia.  Sepsis protocol was initiated, blood cultures obtained, given Rocephin, and azithromycin.  Review of Systems  Unable to perform ROS: Severe respiratory distress    Past Medical History:  Diagnosis Date  . Anemia   . Aortic insufficiency    mild  . Arthritis    "arms" (08/09/2015)  . CAD (coronary artery disease)    cath 08/09/2014 95% stenosis in prox to mid RCA s/p DES, 80-90% prox OM2, 50% distal LAD  . CHF (congestive heart failure) ([place])   . CKD (chronic kidney disease) stage 3, GFR 30-59 ml/min   . Colon polyp    2009 colonoscopy, not retrieved for pathology  . Dyspnea   .  Ectropion of left lower eyelid   . GERD (gastroesophageal reflux disease) 2009   EGD with benign gastric polyp too  . Hyperlipidemia   . Hypertension   . Pneumonia    history of  . Stroke ([place]) 1975  . Vitamin D deficiency     Past Surgical History:  Procedure Laterality Date  . ABDOMINAL HYSTERECTOMY    . APPENDECTOMY    . ARTERY BIOPSY Left 12/16/2012   Procedure: BIOPSY TEMPORAL ARTERY;  Surgeon: Mal Misty, MD;  Location: [place];  Service: Vascular;  Laterality: Left;  . CARDIAC CATHETERIZATION    . LEFT HEART CATHETERIZATION WITH CORONARY ANGIOGRAM N/A 08/09/2014   Procedure: LEFT HEART CATHETERIZATION WITH CORONARY ANGIOGRAM;  Surgeon: Peter M Martinique, MD;  Location: [facility] CATH LAB;  Service: Cardiovascular;  Laterality: N/A;  . PERCUTANEOUS CORONARY STENT INTERVENTION (PCI-S)  08/09/2014   Procedure: PERCUTANEOUS CORONARY STENT INTERVENTION (PCI-S);  Surgeon: Peter M Martinique, MD;  Location: [facility] CATH LAB;  Service: Cardiovascular;;  . TONSILLECTOMY       reports that she quit smoking about 40 years ago. Her smoking use included cigarettes. She has quit using smokeless tobacco. She reports that she does not drink alcohol and does not use drugs.  Allergies  Allergen Reactions  . Remeron [Mirtazapine]     Worsening of baseline AMS with frank encephalopathy    Family History  Problem Relation Age of Onset  . Stroke Mother   . Hypertension Mother   . Stroke Father   . Hypertension Father   . Diabetes Sister  Prior to Admission medications   Medication Sig Start Date End Date Taking? Authorizing Provider  acetaminophen (TYLENOL) 325 MG tablet Take 650 mg by mouth every 6 (six) hours as needed for mild pain or headache.    Yes [provider]  albuterol (VENTOLIN HFA) 108 (90 Base) MCG/ACT inhaler Inhale 1 puff into the lungs every 6 (six) hours as needed for wheezing or shortness of breath.   Yes [provider]  Amino Acids-Protein Hydrolys (FEEDING  SUPPLEMENT, PRO-STAT SUGAR FREE 64,) LIQD Take 30 mLs by mouth in the morning and at bedtime.   Yes [provider]  amLODipine (NORVASC) 5 MG tablet Take 5 mg by mouth daily.   Yes [provider]  aspirin EC 81 MG tablet Take 1 tablet (81 mg total) by mouth daily. 11/18/19  Yes McCue, Janett Billow, NP  atorvastatin (LIPITOR) 40 MG tablet Take 1 tablet (40 mg total) by mouth daily at 6 PM. 09/18/18  Yes Oscar La, Arlo C, PA-C  bisacodyl (DULCOLAX) 10 MG suppository Place 10 mg rectally daily as needed (for constipation not relieved by Milk of Magnesia).    Yes [provider]  bisoprolol (ZEBETA) 10 MG tablet Take 10 mg by mouth in the morning and at bedtime.   Yes [provider]  Eyelid Cleansers (OCUSOFT LID SCRUB PLUS) PADS Place 1 each into both eyes See admin instructions. CLEANSE BILATERAL EYELASHES ONCE DAILY DX: DRY EYES    Yes [provider]  feeding supplement, ENSURE ENLIVE, (ENSURE ENLIVE) LIQD Take 237 mLs by mouth 2 (two) times daily between meals. 03/27/19  Yes Hosie Poisson, MD  ferrous sulfate 325 (65 FE) MG tablet Take 325 mg by mouth 2 (two) times daily with a meal.    Yes [provider]  furosemide (LASIX) 20 MG tablet Take 20 mg by mouth every Monday, Wednesday, and Friday.   Yes [provider]  magnesium hydroxide (MILK OF MAGNESIA) 400 MG/5ML suspension Take 30 mLs by mouth daily as needed for mild constipation.    Yes [provider]  Nutritional Supplement LIQD Take 120 mLs by mouth in the morning, at noon, and at bedtime. MedPass   Yes [provider]  Nutritional Supplements (NUTRITIONAL SUPPLEMENT PO) Take 1 each by mouth daily. Magic Cup   Yes [provider]  pantoprazole sodium (PROTONIX) 40 mg/20 mL PACK Take 20 mLs (40 mg total) by mouth daily. 03/28/19  Yes Hosie Poisson, MD  polyvinyl alcohol (ARTIFICIAL TEARS) 1.4 % ophthalmic solution Place 1 drop into both eyes 2 (two) times daily.  For dry eyes 09/18/18  Yes Lassen, Arlo C, PA-C  saccharomyces boulardii (FLORASTOR) 250 MG capsule Take 250 mg by mouth 2 (two) times daily.   Yes [provider]  senna (SENOKOT) 8.6 MG TABS tablet Take 2 tablets by mouth in the morning and at bedtime.    Yes [provider]  Sodium Phosphates (RA SALINE ENEMA) 19-7 GM/118ML ENEM Place 1 each rectally as needed (for constipation).   Yes [provider]    Physical Exam:  Constitutional: Frail elderly female who appears acutely ill and lethargic Vitals:   01/15/2020 0516 01/12/2020 0613 01/02/2020 0618 01/27/2020 0720  BP: (!) 143/91 (!) 139/94 (!) 135/106 (!) 128/100  Pulse: 93 105 102 91  Resp: (!) 36 (!) 28 (!) 28 (!) 32  Temp:      TempSrc:      SpO2: 93% (!) 66% (!) 77% (!) 79%  Weight:  Height:       Eyes: PERRL, lids and conjunctivae normal ENMT: Mucous membranes are dry. Posterior pharynx clear of any exudate or lesions.  Neck: normal, supple, no masses, no thyromegaly Respiratory: Tachypneic with scattered rhonchi and decreased breath sounds.  Currently on nonrebreather with O2 saturations 83%. Cardiovascular: Tachycardic, no murmurs / rubs / gallops. No extremity edema. 2+ pedal pulses. No carotid bruits.  Abdomen: no tenderness, no masses palpated. No hepatosplenomegaly. Bowel sounds positive.  Musculoskeletal: no clubbing / cyanosis. No joint deformity upper and lower extremities. Good ROM, no contractures. Normal muscle tone.  Skin: no rashes, lesions, ulcers. No induration Neurologic: CN 2-12 grossly intact. Sensation intact, DTR normal. Strength 5/5 in all 4.  Psychiatric: Lethargic unable to assess at this time.    Labs on Admission: I have personally reviewed following labs and imaging studies  CBC: Recent Labs  Lab 01/24/2020 0201  WBC 8.2  NEUTROABS 6.1  HGB 12.0  HCT 37.8  MCV 96.4  PLT 409   Basic Metabolic Panel: Recent Labs  Lab 01/25/2020 0201  NA 145  K 2.9*  CL 99  CO2  30  GLUCOSE 154*  BUN 33*  CREATININE 1.61*  CALCIUM 9.0   GFR: Estimated Creatinine Clearance: 16.6 mL/min (A) (by C-G formula based on SCr of 1.61 mg/dL (H)). Liver Function Tests: Recent Labs  Lab 01/12/2020 0201  AST 354*  ALT 37  ALKPHOS 67  BILITOT 0.8  PROT 7.4  ALBUMIN 2.8*   No results for input(s): LIPASE, AMYLASE in the last 168 hours. No results for input(s): AMMONIA in the last 168 hours. Coagulation Profile: No results for input(s): INR, PROTIME in the last 168 hours. Cardiac Enzymes: No results for input(s): CKTOTAL, CKMB, CKMBINDEX, TROPONINI in the last 168 hours. BNP (last 3 results) No results for input(s): PROBNP in the last 8760 hours. HbA1C: No results for input(s): HGBA1C in the last 72 hours. CBG: No results for input(s): GLUCAP in the last 168 hours. Lipid Profile: No results for input(s): CHOL, HDL, LDLCALC, TRIG, CHOLHDL, LDLDIRECT in the last 72 hours. Thyroid Function Tests: No results for input(s): TSH, T4TOTAL, FREET4, T3FREE, THYROIDAB in the last 72 hours. Anemia Panel: No results for input(s): VITAMINB12, FOLATE, FERRITIN, TIBC, IRON, RETICCTPCT in the last 72 hours. Urine analysis:    Component Value Date/Time   COLORURINE YELLOW 09/16/2019 2346   APPEARANCEUR HAZY (A) 09/16/2019 2346   LABSPEC 1.011 09/16/2019 2346   PHURINE 6.0 09/16/2019 2346   GLUCOSEU NEGATIVE 09/16/2019 2346   HGBUR SMALL (A) 09/16/2019 2346   BILIRUBINUR NEGATIVE 09/16/2019 2346   [place] 09/16/2019 2346   PROTEINUR 100 (A) 09/16/2019 2346   UROBILINOGEN 0.2 02/05/2015 0555   NITRITE NEGATIVE 09/16/2019 2346   LEUKOCYTESUR LARGE (A) 09/16/2019 2346   Sepsis Labs: Recent Results (from the past 240 hour(s))  SARS Coronavirus 2 by RT PCR (hospital order, performed in [place] hospital lab) Nasopharyngeal Nasopharyngeal Swab     Status: None   Collection Time: 01/29/2020  6:41 AM   Specimen: Nasopharyngeal Swab  Result Value Ref Range Status    SARS Coronavirus 2 NEGATIVE NEGATIVE Final    Comment: (NOTE) SARS-CoV-2 target nucleic acids are NOT DETECTED.  The SARS-CoV-2 RNA is generally detectable in upper and lower respiratory specimens during the acute phase of infection. The lowest concentration of SARS-CoV-2 viral copies this assay can detect is 250 copies / mL. A negative result does not preclude SARS-CoV-2 infection and should not be used as  the sole basis for treatment or other patient management decisions.  A negative result may occur with improper specimen collection / handling, submission of specimen other than nasopharyngeal swab, presence of viral mutation(s) within the areas targeted by this assay, and inadequate number of viral copies (<250 copies / mL). A negative result must be combined with clinical observations, patient history, and epidemiological information.  Fact Sheet for Patients:   StrictlyIdeas.no  Fact Sheet for Healthcare Providers: BankingDealers.co.za  This test is not yet approved or  cleared by the [place] FDA and has been authorized for detection and/or diagnosis of SARS-CoV-2 by FDA under an Emergency Use Authorization (EUA).  This EUA will remain in effect (meaning this test can be used) for the duration of the COVID-19 declaration under Section 564(b)(1) of the Act, 21 U.S.C. section 360bbb-3(b)(1), unless the authorization is terminated or revoked sooner.  Performed at [facility], [place] [address] [place], [place] [address]      Radiological Exams on Admission: DG Chest Port 1 View  Result Date: 01/06/2020 CLINICAL DATA:  Hypoxia EXAM: PORTABLE CHEST 1 VIEW COMPARISON:  March 26, 2019 FINDINGS: The heart size and mediastinal contours are within normal limits. Again noted is moderate cardiomegaly. Diffusely increased interstitial markings seen throughout both lungs. There is patchy airspace opacity seen within the right  lung base and retrocardiac region. No acute osseous abnormality. IMPRESSION: Multifocal interstitial patchy airspace opacities throughout both lungs which could be due to multifocal pneumonia. Electronically Signed   By: Prudencio Pair M.D.   On: 01/13/2020 02:22    EKG: Independently reviewed.  Sinus tachycardia 115 bpm  Assessment/Plan Acute respiratory failure with hypoxia: Patient presented from nursing facility for respiratory distress.  Found to be hypoxic into the 60s placed on a nonrebreather with only mild improvement to the low 80s.  Orders placed for venous blood gas appear to be compensated. At this time patient appears critical with O2 saturations in the 80s currently on nonrebreather mask.  Patient had been noted to have had at least one episode of vomiting earlier and was lethargic for which there was significant concern for possible aspiration therefore BiPAP was not attempted. -Admit to a progressive bed -Aspiration precaution with elevation of the head of the bed -Continuous pulse oximetry with plan for high flow heated nasal cannula oxygen -Respiratory therapy consult -DuoNebs 4 times daily -Pulmonary toilet   Sepsis secondary to multifocal pneumonia: Acute.  Patient was found to be afebrile with respirations 28-36 and lactic acid elevated up to 3.9.  Sepsis protocol has been initiated with full bolus of 1.5 L of lactated Ringer's and empiric antibiotics of Rocephin and azithromycin.  Repeat lactic acid was trending down at 2.7.  Question possibility of aspiration. -Follow-up pan culture -Continue empiric antibiotics of Rocephin and azithromycin -Continue to trend lactic acid levels -Speech therapy consult  Hypokalemia: Acute.  Potassium initially noted to be 2.9.  Patient was ordered 20 mEq of potassium chloride IV. -Give additional 40 mEq of potassium chloride IV -Continue to monitor and place as needed   Acute kidney injury superimposed on chronic kidney disease: Patient  baseline creatinine noted to be 1.3, but presents with creatinine elevated to 1.61 with BUN 33.  Suspecting prerenal cause of symptoms. -Gentle IV fluids to 50 mL/h -Recheck creatinine in a.m.   Chronic diastolic congestive heart failure: Patient appears to be hypovolemic at this time.  Last EF noted to be 60-65% by echocardiogram from 03/18/2019. -Strict intake and output -Daily  weights -Hold diuretics  Hypertensive urgency: Systolic blood pressures seem to be initially elevated 191. -Hydralazine IV as needed for now -Restart home medications when able   Elevated AST: Acute.  AST acutely elevated at 354.  Unclear the cause of this at this time. -Recheck CMP in a.m.  History of subdural hematoma: During last admission patient had a fall and was noted to have a right subdural hematoma.  History of Covid -19 infection: Patient with prior history of COVID-19 infection on 08/03/2019.  Repeat Covid testing today negative.  DNR: Present on admission.  DVT prophylaxis: Lovenox Code Status: DNR Family Communication: Discussed plan of care with the patient's wife.  Additional family present at bedside Disposition Plan: To be determined Consults called: None Admission status: Inpatient  Norval Morton MD [facility] Pager [phone]   If 7PM-7AM, please contact night-coverage [email or web address] Password [email or web address]  01/05/2020, 9:00 AM

## 2020-01-26 NOTE — ED Triage Notes (Signed)
Pt coming from [facility]. Ems was called out for respiratory distress. Pt recently diagnosed with pneumonia. Per staff pt was sating at 66 on 2L [place]. Pt placed on a non rebreather with ems sating at 95%. Unsure patient numerological baseline per ems.

## 2020-01-26 NOTE — ED Notes (Signed)
Pt SpO2 dropped to 84% on NRB. PA notified and is at bedside to assess

## 2020-01-26 NOTE — ED Notes (Addendum)
Pt coughing up pink tinged sputum

## 2020-01-26 NOTE — ED Notes (Signed)
PA made aware of pt respiratory status. 74%

## 2020-01-26 NOTE — Consult Note (Signed)
NAME:  Jane [name], MRN:  [id number], DOB:  1928/08/07, LOS: 0 ADMISSION DATE:  01/20/2020, CONSULTATION DATE:  01/25/2020 REFERRING MD:  Tamala Julian, CHIEF COMPLAINT:  Respiratory failure   Brief History   86 yo female admitted to [facility] 01/14/2020 for acute hypoxic respiratory failure. Despite recieiving maximum oxygen therapy, pt continued to be hypoxic with sats in the low to mid 80s and PCCM was consulted for further evaluation. Upon speaking with pt's family, they wished her to be transitioned to comfort measures and to not pursue further aggressive treatment.  History of present illness   86yo female with HFpEF, htn, CKD, dementia, subdural hematoma and history of IVH who presented to [facility] with respiratory distress. On arrival to the ED, O2 sats were in the mid 60s%. O2 sats improved mildly with supplemental oxygen however remained low. She had vomited so BiPAP was deferred. She was placed on 30L HHF at 100% FiO2 and 15L NR however O2 sats remained in the low 80s.  Lactate 3.9, K 2.9,chest xray with multifocal pna. Respiratory failure continued to progress and PCCM was consulted for further evaluation.  We discussed that her current O2 requirements are not being adequately maintained on maximum non invasive oxygenation. Discussed the alternatives including intubating vs using a more comfort focused approach. We discussed that pt is unlikely to survive without intubation. After all questions were answered, family believes that pt would be better served with a comfort measure approach. Past Medical History  HTN, HLD, CAD, HFpEF, CVA, subdural hematoma, CKD 3  Significant Hospital Events   7/28 admission; transition to comfort  Consults:  PCCM  Procedures:  n/a  Significant Diagnostic Tests:  CXR>>multifocal interstitial opacities throughout bilateral lung fields  Micro Data:  B/C x2>>  Antimicrobials:  n/a  Interim history/subjective:  See above  Objective   Blood pressure (!) 133/66,  pulse 93, temperature 98.2 F (36.8 C), temperature source Axillary, resp. rate (!) 29, height 4\' 10"  (1.473 m), weight 50 kg, SpO2 (!) 88 %.    FiO2 (%):  [100 %] 100 %   Intake/Output Summary (Last 24 hours) at 12/31/2019 1538 Last data filed at 01/27/2020 1407 Gross per 24 hour  Intake 2000 ml  Output --  Net 2000 ml   Filed Weights   01/20/2020 0512  Weight: 50 kg    Examination: General: critically and chronically ill appearing female in apparent respiratory distress HENT: bilateral ectropion, HHFNC, NR mask Lungs: in clear respiratory distress. Tachypnea. Diffuse bilateral crackles and rhonchi Cardiovascular: tachycardic rate. Peripheries warm Abdomen: nondistended Extremities: warm Neuro: encephalopathic Skin: no rash   Assessment & Plan:   Acute hypoxic respiratory failure 2/2 bilateral multifocal pneumonias Sepsis Hypokalemia AKI on CKD 3 AGMA/lactic acidosis Hypertensive urgency Elevated AST HFpEF  Goals of care: Overall, throughout her time since admission, it is shown that adequate oxygenation is unable to be achieved with current non-invasive measures. Dr. Tacy Learn and myself spoke with family regarding alternatives moving forward including intubation vs comfort measures. After all questions were answered, family believes it is in the patient's best wishes to be transitioned to comfort care and to forgo invasive ventilation measures. Given her overall clinical picture, I also agree that this will be the most appropriate thing moving forward.  Plan: orders for comfort measures placed --no lab sticks --turn off vital monitor. D/c tele monitor --morphine 1-4mg  q1h for air hunger --ativan for anxiety --glycopyrrolate for secretions --zofran for nausea  **Thank you for allowing [place] to participate in this patient's  care. Please page the on call pager for questions/concerns**  Best practice:  Diet: regular Pain/Anxiety/Delirium protocol (if indicated): morphine,  ativan VAP protocol (if indicated): n/a DVT prophylaxis: n/a GI prophylaxis: zofran for nausea Glucose control: n/a Mobility: BR Code Status: Comfort care only. DNR/DNI Family Communication: family updated at bedside Disposition: admit to [facility]  Labs   CBC: Recent Labs  Lab 01/22/2020 0201 01/15/2020 0931  WBC 8.2  --   NEUTROABS 6.1  --   HGB 12.0 11.2*  HCT 37.8 33.0*  MCV 96.4  --   PLT 224  --     Basic Metabolic Panel: Recent Labs  Lab 01/13/2020 0201 01/07/2020 0931  NA 145 146*  K 2.9* 2.8*  CL 99  --   CO2 30  --   GLUCOSE 154*  --   BUN 33*  --   CREATININE 1.61*  --   CALCIUM 9.0  --    GFR: Estimated Creatinine Clearance: 16.6 mL/min (A) (by C-G formula based on SCr of 1.61 mg/dL (H)). Recent Labs  Lab 01/22/2020 0201 01/25/2020 0311 01/25/2020 0406 01/05/2020 1200  WBC 8.2  --   --   --   LATICACIDVEN  --  3.9* 2.7* 4.3*    Liver Function Tests: Recent Labs  Lab 01/29/2020 0201  AST 354*  ALT 37  ALKPHOS 67  BILITOT 0.8  PROT 7.4  ALBUMIN 2.8*   No results for input(s): LIPASE, AMYLASE in the last 168 hours. No results for input(s): AMMONIA in the last 168 hours.  ABG    Component Value Date/Time   PHART 7.471 (H) 09/18/2019 1213   PCO2ART 36.3 09/18/2019 1213   PO2ART 85.7 09/18/2019 1213   HCO3 32.4 (H) 01/21/2020 0931   TCO2 34 (H) 01/09/2020 0931   ACIDBASEDEF 5.0 (H) 10/10/2014 1223   O2SAT 43.0 01/23/2020 0931     Coagulation Profile: No results for input(s): INR, PROTIME in the last 168 hours.  Cardiac Enzymes: No results for input(s): CKTOTAL, CKMB, CKMBINDEX, TROPONINI in the last 168 hours.  HbA1C: Hgb A1c MFr Bld  Date/Time Value Ref Range Status  03/24/2019 05:12 AM 5.1 4.8 - 5.6 % Final    Comment:    (NOTE)         Prediabetes: 5.7 - 6.4         Diabetes: >6.4         Glycemic control for adults with diabetes: <7.0   03/23/2019 11:29 AM 5.2 4.8 - 5.6 % Final    Comment:    (NOTE)         Prediabetes: 5.7 - 6.4          Diabetes: >6.4         Glycemic control for adults with diabetes: <7.0     CBG: No results for input(s): GLUCAP in the last 168 hours.  Review of Systems:   Unable to obtain due to critical illness  Past Medical History  She,  has a past medical history of Anemia, Aortic insufficiency, Arthritis, CAD (coronary artery disease), CHF (congestive heart failure) ([place]), CKD (chronic kidney disease) stage 3, GFR 30-59 ml/min, Colon polyp, Dyspnea, Ectropion of left lower eyelid, GERD (gastroesophageal reflux disease) (2009), Hyperlipidemia, Hypertension, Pneumonia, Stroke ([place]) (1975), and Vitamin D deficiency.   Surgical History    Past Surgical History:  Procedure Laterality Date   ABDOMINAL HYSTERECTOMY     APPENDECTOMY     ARTERY BIOPSY Left 12/16/2012   Procedure: BIOPSY TEMPORAL ARTERY;  Surgeon: Jeneen Rinks  Rockwell Alexandria, MD;  Location: [place];  Service: Vascular;  Laterality: Left;   CARDIAC CATHETERIZATION     LEFT HEART CATHETERIZATION WITH CORONARY ANGIOGRAM N/A 08/09/2014   Procedure: LEFT HEART CATHETERIZATION WITH CORONARY ANGIOGRAM;  Surgeon: Peter M Martinique, MD;  Location: [facility] CATH LAB;  Service: Cardiovascular;  Laterality: N/A;   PERCUTANEOUS CORONARY STENT INTERVENTION (PCI-S)  08/09/2014   Procedure: PERCUTANEOUS CORONARY STENT INTERVENTION (PCI-S);  Surgeon: Peter M Martinique, MD;  Location: [facility] CATH LAB;  Service: Cardiovascular;;   TONSILLECTOMY       Social History   reports that she quit smoking about 40 years ago. Her smoking use included cigarettes. She has quit using smokeless tobacco. She reports that she does not drink alcohol and does not use drugs.   Family History   Her family history includes Diabetes in her sister; Hypertension in her father and mother; Stroke in her father and mother.   Allergies Allergies  Allergen Reactions   Remeron [Mirtazapine]     Worsening of baseline AMS with frank encephalopathy     Home Medications  Prior to Admission medications    Medication Sig Start Date End Date Taking? Authorizing Provider  acetaminophen (TYLENOL) 325 MG tablet Take 650 mg by mouth every 6 (six) hours as needed for mild pain or headache.    Yes [provider]  albuterol (VENTOLIN HFA) 108 (90 Base) MCG/ACT inhaler Inhale 1 puff into the lungs every 6 (six) hours as needed for wheezing or shortness of breath.   Yes [provider]  Amino Acids-Protein Hydrolys (FEEDING SUPPLEMENT, PRO-STAT SUGAR FREE 64,) LIQD Take 30 mLs by mouth in the morning and at bedtime.   Yes [provider]  amLODipine (NORVASC) 5 MG tablet Take 5 mg by mouth daily.   Yes [provider]  aspirin EC 81 MG tablet Take 1 tablet (81 mg total) by mouth daily. 11/18/19  Yes McCue, Janett Billow, NP  atorvastatin (LIPITOR) 40 MG tablet Take 1 tablet (40 mg total) by mouth daily at 6 PM. 09/18/18  Yes Oscar La, Arlo C, PA-C  bisacodyl (DULCOLAX) 10 MG suppository Place 10 mg rectally daily as needed (for constipation not relieved by Milk of Magnesia).    Yes [provider]  bisoprolol (ZEBETA) 10 MG tablet Take 10 mg by mouth in the morning and at bedtime.   Yes [provider]  Eyelid Cleansers (OCUSOFT LID SCRUB PLUS) PADS Place 1 each into both eyes See admin instructions. CLEANSE BILATERAL EYELASHES ONCE DAILY DX: DRY EYES    Yes [provider]  feeding supplement, ENSURE ENLIVE, (ENSURE ENLIVE) LIQD Take 237 mLs by mouth 2 (two) times daily between meals. 03/27/19  Yes Hosie Poisson, MD  ferrous sulfate 325 (65 FE) MG tablet Take 325 mg by mouth 2 (two) times daily with a meal.    Yes [provider]  furosemide (LASIX) 20 MG tablet Take 20 mg by mouth every Monday, Wednesday, and Friday.   Yes [provider]  magnesium hydroxide (MILK OF MAGNESIA) 400 MG/5ML suspension Take 30 mLs by mouth daily as needed for mild constipation.    Yes [provider]  Nutritional Supplement LIQD Take 120 mLs by mouth  in the morning, at noon, and at bedtime. MedPass   Yes [provider]  Nutritional Supplements (NUTRITIONAL SUPPLEMENT PO) Take 1 each by mouth daily. Magic Cup   Yes [provider]  pantoprazole sodium (PROTONIX) 40 mg/20 mL PACK Take 20 mLs (40 mg total)  by mouth daily. 03/28/19  Yes Hosie Poisson, MD  polyvinyl alcohol (ARTIFICIAL TEARS) 1.4 % ophthalmic solution Place 1 drop into both eyes 2 (two) times daily. For dry eyes 09/18/18  Yes Lassen, Arlo C, PA-C  saccharomyces boulardii (FLORASTOR) 250 MG capsule Take 250 mg by mouth 2 (two) times daily.   Yes [provider]  senna (SENOKOT) 8.6 MG TABS tablet Take 2 tablets by mouth in the morning and at bedtime.    Yes [provider]  Sodium Phosphates (RA SALINE ENEMA) 19-7 GM/118ML ENEM Place 1 each rectally as needed (for constipation).   Yes [provider]    Mitzi Hansen, MD Internal Medicine Resident PGY-2 [facility] Pager: [phone] 01/14/2020 4:30 PM

## 2020-01-26 NOTE — ED Notes (Signed)
Unable to obtain 2nd set of blood cultures. 

## 2020-01-26 NOTE — ED Notes (Signed)
Called pt contact: [name]. Provided him with update about pt condition. [name] verbalized understanding

## 2020-01-27 MED ORDER — IPRATROPIUM-ALBUTEROL 0.5-2.5 (3) MG/3ML IN SOLN: 3.0000 mL | RESPIRATORY_TRACT | Status: DC | PRN

## 2020-01-27 NOTE — Progress Notes (Signed)
PROGRESS NOTE  Jane [name]  DOB: 10/12/1928  PCP: Hendricks Limes, MD KVQ:[id number]  DOA: 01/24/2020  LOS: 1 day   Chief Complaint  Patient presents with  . Respiratory Distress   Brief narrative: Jane [name] is a 86 y.o. female with PMH of HTN, HLD, CAD, HFpEF, CVA, subdural hematoma and history of IVH, dementia, CKD 3. Patient presented to the ED on 01/27/2020 for worsening respiratory distress.  On arrival to the ED, oxygen saturation was in the mid 60s.  Did not improve significantly on nasal cannula.  BiPAP could not be used because patient vomited.  She was placed on 30 L high flow nasal cannula 100% FiO2 and subsequently required 100% O2 by a NRB for his oxygen saturation remained in 80s.   Labs significant for WBC 8.2, hemoglobin 12, potassium 2.9, BUN 33, creatinine 1.61, glucose 1 5064, and lactic acid 3.9-> 2.7 > 4.3   Chest x-ray showed a multifocal pneumonia.    Sepsis protocol was initiated, blood cultures obtained, given Rocephin, and azithromycin. Patient was admitted to hospitalist service. While in ED, respiratory status continued to worsen.  PCCM was consulted as well. PCCM discussed with the family.  Family choose comfort care measures.  Subjective: Patient was seen and examined this morning. Thin built elderly African-American female. On nonrebreather mask.  He was comfortable. Family at bedside.  Confirmed their intention of comfort care measures with anticipated natural outcome of death  Assessment/Plan: Acute respiratory failure with hypoxia -Presented with respiratory distress  -Currently on high flow oxygen by nasal cannula.   -Comfort care measures started after discussed with family.    Sepsis secondary to multifocal pneumonia: POA  Acute hypokalemia: Potassium initially noted to be 2.9. Replaced  chronic kidney disease 3b to 4 -creatinine at baseline  Chronic diastolic congestive heart failure Essential hypertension   History of  subdural hematoma: During last admission patient had a fall and was noted to have a right subdural hematoma.  History of Covid -19 infection: Patient with prior history of COVID-19 infection on 08/03/2019. Repeat Covid testing today negative.  Mobility: Comfort care Code Status:   Code Status: DNR comfort care Nutritional status: Body mass index is 23.04 kg/m.     Diet Order            Diet Heart Room service appropriate? Yes; Fluid consistency: Thin  Diet effective now                 DVT prophylaxis:  Consultants: pccm Family Communication:  Discussed with family at bedside  Status is: Inpatient  Remains inpatient appropriate because: Comfort care measures  Dispo: The patient is from: Home              Anticipated d/c is to: In-hospital mortality anticipated            Infusions:    Scheduled Meds: . glycopyrrolate  0.4 mg Intravenous Q4H  . ipratropium-albuterol  3 mL Nebulization QID  . LORazepam  0.5-1 mg Intravenous Q2H  . polyvinyl alcohol  1 drop Both Eyes BID    Antimicrobials: Anti-infectives (From admission, onward)   Start     Dose/Rate Route Frequency Ordered Stop   01/27/20 0800  cefTRIAXone (ROCEPHIN) 2 g in sodium chloride 0.9 % 100 mL IVPB  Status:  Discontinued        2 g 200 mL/hr over 30 Minutes Intravenous Every 24 hours 01/10/2020 0911 01/29/2020 1611   01/27/20 0800  azithromycin (ZITHROMAX) 500 mg  in sodium chloride 0.9 % 250 mL IVPB  Status:  Discontinued        500 mg 250 mL/hr over 60 Minutes Intravenous Every 24 hours 01/21/2020 0911 01/25/2020 1611   01/10/2020 0400  cefTRIAXone (ROCEPHIN) 2 g in sodium chloride 0.9 % 100 mL IVPB        2 g 200 mL/hr over 30 Minutes Intravenous  Once 01/27/2020 0354 01/13/2020 0552   01/24/2020 0400  azithromycin (ZITHROMAX) 500 mg in sodium chloride 0.9 % 250 mL IVPB        500 mg 250 mL/hr over 60 Minutes Intravenous  Once 01/03/2020 0354 01/15/2020 0715      PRN meds: acetaminophen, antiseptic oral rinse,  diphenhydrAMINE **OR** diphenhydrAMINE, fentaNYL (SUBLIMAZE) injection, morphine injection, ondansetron (ZOFRAN) IV, polyvinyl alcohol   Objective: Vitals:   01/27/20 0241 01/27/20 0722  BP:    Pulse:    Resp: (!) 36   Temp:    SpO2:  90%    Intake/Output Summary (Last 24 hours) at 01/27/2020 0729 Last data filed at 01/27/2020 0445 Gross per 24 hour  Intake 1900 ml  Output 0 ml  Net 1900 ml   Filed Weights   01/15/2020 0512  Weight: 50 kg   Weight change:  Body mass index is 23.04 kg/m.   Physical Exam: General exam: Appears calm and comfortable.  Mild respiratory distress Did not do a detailed examination at family's request because of comfort care status.  Data Review: I have personally reviewed the laboratory data and studies available.  Recent Labs  Lab 01/08/2020 0201 01/25/2020 0931  WBC 8.2  --   NEUTROABS 6.1  --   HGB 12.0 11.2*  HCT 37.8 33.0*  MCV 96.4  --   PLT 224  --    Recent Labs  Lab 01/10/2020 0201 01/12/2020 0931  NA 145 146*  K 2.9* 2.8*  CL 99  --   CO2 30  --   GLUCOSE 154*  --   BUN 33*  --   CREATININE 1.61*  --   CALCIUM 9.0  --    Lab Results  Component Value Date   HGBA1C 5.1 03/24/2019       Component Value Date/Time   CHOL 185 03/19/2019 0621   CHOL 140 12/31/2016 1409   TRIG 146 03/19/2019 0621   HDL 43 03/19/2019 0621   HDL 61 12/31/2016 1409   CHOLHDL 4.3 03/19/2019 0621   VLDL 29 03/19/2019 0621   LDLCALC 113 (H) 03/19/2019 0621   LDLCALC 68 12/31/2016 1409   LABVLDL 11 12/31/2016 1409    Signed, Jane Croak, MD [facility] Pager: [phone] (Secure Chat preferred). 01/27/2020

## 2020-01-30 NOTE — Progress Notes (Signed)
Pt passed away at 1115. Presence for a heart beat was assessed by Trenda Moots RN and Albertina Senegal RN. Pt's family at bedside - emotional support was offered.   RN notified Dahal MD - death certificate filled out.   Organ donation called - pt not an organ donor candidate.   Post mortem care will be completed once family has said their final good byes.

## 2020-01-30 NOTE — Progress Notes (Signed)
PROGRESS NOTE  Jane [name]  DOB: October 13, 1928  PCP: Hendricks Limes, MD [id number]  DOA: 01/15/2020  LOS: 2 days   Chief Complaint  Patient presents with  . Respiratory Distress   Brief narrative: Jane [name] is a 86 y.o. female with PMH of HTN, HLD, CAD, HFpEF, CVA, subdural hematoma and history of IVH, dementia, CKD 3. Patient presented to the ED on 01/16/2020 for worsening respiratory distress.  On arrival to the ED, oxygen saturation was in the mid 60s.  Did not improve significantly on nasal cannula.  BiPAP could not be used because patient vomited.  She was placed on 30 L high flow nasal cannula 100% FiO2 and subsequently required 100% O2 by a NRB for his oxygen saturation remained in 80s.   Labs significant for WBC 8.2, hemoglobin 12, potassium 2.9, BUN 33, creatinine 1.61, glucose 1 5064, and lactic acid 3.9-> 2.7 > 4.3   Chest x-ray showed a multifocal pneumonia.    Sepsis protocol was initiated, blood cultures obtained, given Rocephin, and azithromycin. Patient was admitted to hospitalist service. While in ED, respiratory status continued to worsen.  PCCM was consulted as well. PCCM discussed with the family.  Family choose comfort care measures.  Subjective: Patient was seen and examined this morning. Comfortable.  Not in distress.  However patient is still maintained on 100% nonrebreather mask. Since she is on comfort care measures only with anticipated outcome, she does not need 100% nonrebreather and can be switched down to low-flow oxygen by nasal cannula No family at bedside today.  Assessment/Plan: Acute respiratory failure with hypoxia -Presented with respiratory distress  -Initially placed on high flow Oxymizer cannula. -Comfort care measures started after discussed with family.   -Can switch down to low-flow oxygen by nasal cannula.  Sepsis secondary to multifocal pneumonia: POA Acute hypokalemia:  chronic kidney disease 3b to 4 -creatinine at  baseline Chronic diastolic congestive heart failure Essential hypertension  History of subdural hematoma: During last admission patient had a fall and was noted to have a right subdural hematoma. History of Covid -19 infection: Patient with prior history of COVID-19 infection on 08/03/2019. Repeat Covid testing today negative.  Mobility: Comfort care Code Status:   Code Status: DNR comfort care Nutritional status: Body mass index is 23.04 kg/m.     Diet Order            Diet Heart Room service appropriate? Yes; Fluid consistency: Thin  Diet effective now                 DVT prophylaxis:  Consultants: pccm Family Communication:  Discussed with family at bedside  Status is: Inpatient  Remains inpatient appropriate because: Comfort care measures  Dispo: The patient is from: Home              Anticipated d/c is to: In-hospital mortality anticipated           Infusions:    Scheduled Meds: . glycopyrrolate  0.4 mg Intravenous Q4H  . LORazepam  0.5-1 mg Intravenous Q2H  . polyvinyl alcohol  1 drop Both Eyes BID    Antimicrobials: Anti-infectives (From admission, onward)   Start     Dose/Rate Route Frequency Ordered Stop   01/27/20 0800  cefTRIAXone (ROCEPHIN) 2 g in sodium chloride 0.9 % 100 mL IVPB  Status:  Discontinued        2 g 200 mL/hr over 30 Minutes Intravenous Every 24 hours 01/18/2020 0911 01/15/2020 1611   01/27/20 0800  azithromycin (ZITHROMAX) 500 mg in sodium chloride 0.9 % 250 mL IVPB  Status:  Discontinued        500 mg 250 mL/hr over 60 Minutes Intravenous Every 24 hours 12/30/2019 0911 01/09/2020 1611   01/21/2020 0400  cefTRIAXone (ROCEPHIN) 2 g in sodium chloride 0.9 % 100 mL IVPB        2 g 200 mL/hr over 30 Minutes Intravenous  Once 01/25/2020 0354 01/21/2020 0552   01/29/2020 0400  azithromycin (ZITHROMAX) 500 mg in sodium chloride 0.9 % 250 mL IVPB        500 mg 250 mL/hr over 60 Minutes Intravenous  Once 01/06/2020 0354 01/07/2020 0715      PRN  meds: acetaminophen, antiseptic oral rinse, diphenhydrAMINE **OR** diphenhydrAMINE, fentaNYL (SUBLIMAZE) injection, ipratropium-albuterol, morphine injection, ondansetron (ZOFRAN) IV, polyvinyl alcohol   Objective: Vitals:   2020-02-26 0804 2020/02/26 0918  BP:    Pulse:    Resp:    Temp:    SpO2: 95% 90%   No intake or output data in the 24 hours ending Feb 26, 2020 1041 Filed Weights   01/25/2020 0512  Weight: 50 kg   Weight change:  Body mass index is 23.04 kg/m.   Physical Exam: General exam: Appears calm and comfortable.  Mild respiratory distress Did not do a detailed examination at family's request because of comfort care status.  Data Review: I have personally reviewed the laboratory data and studies available.  Recent Labs  Lab 01/25/2020 0201 01/04/2020 0931  WBC 8.2  --   NEUTROABS 6.1  --   HGB 12.0 11.2*  HCT 37.8 33.0*  MCV 96.4  --   PLT 224  --    Recent Labs  Lab 01/09/2020 0201 01/01/2020 0931  NA 145 146*  K 2.9* 2.8*  CL 99  --   CO2 30  --   GLUCOSE 154*  --   BUN 33*  --   CREATININE 1.61*  --   CALCIUM 9.0  --    Lab Results  Component Value Date   HGBA1C 5.1 03/24/2019       Component Value Date/Time   CHOL 185 03/19/2019 0621   CHOL 140 12/31/2016 1409   TRIG 146 03/19/2019 0621   HDL 43 03/19/2019 0621   HDL 61 12/31/2016 1409   CHOLHDL 4.3 03/19/2019 0621   VLDL 29 03/19/2019 0621   LDLCALC 113 (H) 03/19/2019 0621   LDLCALC 68 12/31/2016 1409   LABVLDL 11 12/31/2016 1409    Signed, Terrilee Croak, MD [facility] Pager: [phone] (Secure Chat preferred). 02/26/20

## 2020-01-30 NOTE — Discharge Summary (Signed)
Death Summary  Jane [name] TLX:[id number] DOB: 15-Aug-1928 DOA: February 14, 2020  PCP: Hendricks Limes, MD  Admit date: 02-14-20 Date of Death: 02-17-20 Time of Death: 11:15 Notification: Hendricks Limes, MD notified of death of 02-17-20   History of present illness:  Jane [name] is a 86 y.o. female with PMH of HTN, HLD, CAD, HFpEF, CVA, subdural hematoma and history of IVH, dementia, CKD 3. Patient presented to the ED on 2020/02/14 for worsening respiratory distress. On arrival to the ED, oxygen saturation was in the mid 60s.  Did not improve significantly on nasal cannula.  BiPAP could not be used because patient vomited.  She was placed on 30 L high flow nasal cannula 100% FiO2 and subsequently required 100% O2 by a NRB for his oxygen saturation remained in 80s.   Labs significant for WBC 8.2, hemoglobin 12, potassium 2.9, BUN 33, creatinine 1.61, glucose 1 5064, and lactic acid 3.9->2.7 > 4.3  Chest x-ray showed a multifocal pneumonia.   Sepsis protocolwas initiated, blood cultures obtained, given Rocephin, and azithromycin. Patient was admitted to hospitalist service. While in ED, respiratory status continued to worsen.  PCCM was consulted as well. PCCM discussed with the family.  Family choose comfort care measures.  Patient passed away on 16-Feb-2023 at 11:15 AM  Final Diagnoses:  1. Acute respiratory failure with hypoxia 2. Sepsis secondary to multifocal pneumonia   The results of significant diagnostics from this hospitalization (including imaging, microbiology, ancillary and laboratory) are listed below for reference.    Significant Diagnostic Studies: DG Chest Port 1 View  Result Date: February 14, 2020 CLINICAL DATA:  Hypoxia EXAM: PORTABLE CHEST 1 VIEW COMPARISON:  March 26, 2019 FINDINGS: The heart size and mediastinal contours are within normal limits. Again noted is moderate cardiomegaly. Diffusely increased interstitial markings seen throughout both lungs. There  is patchy airspace opacity seen within the right lung base and retrocardiac region. No acute osseous abnormality. IMPRESSION: Multifocal interstitial patchy airspace opacities throughout both lungs which could be due to multifocal pneumonia. Electronically Signed   By: Prudencio Pair M.D.   On: 02-14-2020 02:22    Microbiology: Recent Results (from the past 240 hour(s))  Culture, blood (routine x 2)     Status: None (Preliminary result)   Collection Time: 02-14-2020  2:45 AM   Specimen: BLOOD  Result Value Ref Range Status   Specimen Description BLOOD RIGHT ARM  Final   Special Requests   Final    BOTTLES DRAWN AEROBIC AND ANAEROBIC Blood Culture adequate volume   Culture   Final    NO GROWTH 3 DAYS Performed at [facility], [address] [place], [place] [address]    Report Status PENDING  Incomplete  Culture, blood (routine x 2)     Status: None (Preliminary result)   Collection Time: 2020-02-14  3:12 AM   Specimen: BLOOD  Result Value Ref Range Status   Specimen Description BLOOD LEFT ARM  Final   Special Requests   Final    BOTTLES DRAWN AEROBIC ONLY Blood Culture adequate volume   Culture   Final    NO GROWTH 3 DAYS Performed at [facility], [address] [place], [place] [address]    Report Status PENDING  Incomplete  SARS Coronavirus 2 by RT PCR (hospital order, performed in [facility] hospital lab) Nasopharyngeal Nasopharyngeal Swab     Status: None   Collection Time: Feb 14, 2020  6:41 AM   Specimen: Nasopharyngeal Swab  Result Value Ref  Range Status   SARS Coronavirus 2 NEGATIVE NEGATIVE Final    Comment: (NOTE) SARS-CoV-2 target nucleic acids are NOT DETECTED.  The SARS-CoV-2 RNA is generally detectable in upper and lower respiratory specimens during the acute phase of infection. The lowest concentration of SARS-CoV-2 viral copies this assay can detect is 250 copies / mL. A negative result does not preclude SARS-CoV-2 infection and should not be used as  the sole basis for treatment or other patient management decisions.  A negative result may occur with improper specimen collection / handling, submission of specimen other than nasopharyngeal swab, presence of viral mutation(s) within the areas targeted by this assay, and inadequate number of viral copies (<250 copies / mL). A negative result must be combined with clinical observations, patient history, and epidemiological information.  Fact Sheet for Patients:   StrictlyIdeas.no  Fact Sheet for Healthcare Providers: BankingDealers.co.za  This test is not yet approved or  cleared by the [place] FDA and has been authorized for detection and/or diagnosis of SARS-CoV-2 by FDA under an Emergency Use Authorization (EUA).  This EUA will remain in effect (meaning this test can be used) for the duration of the COVID-19 declaration under Section 564(b)(1) of the Act, 21 U.S.C. section 360bbb-3(b)(1), unless the authorization is terminated or revoked sooner.  Performed at [facility], [place] [address], [place], [place] [address]      Labs: Basic Metabolic Panel: Recent Labs  Lab 01/22/2020 0201 01/06/2020 0931  NA 145 146*  K 2.9* 2.8*  CL 99  --   CO2 30  --   GLUCOSE 154*  --   BUN 33*  --   CREATININE 1.61*  --   CALCIUM 9.0  --    Liver Function Tests: Recent Labs  Lab 01/11/2020 0201  AST 354*  ALT 37  ALKPHOS 67  BILITOT 0.8  PROT 7.4  ALBUMIN 2.8*   No results for input(s): LIPASE, AMYLASE in the last 168 hours. No results for input(s): AMMONIA in the last 168 hours. CBC: Recent Labs  Lab 01/25/2020 0201 01/20/2020 0931  WBC 8.2  --   NEUTROABS 6.1  --   HGB 12.0 11.2*  HCT 37.8 33.0*  MCV 96.4  --   PLT 224  --    Cardiac Enzymes: No results for input(s): CKTOTAL, CKMB, CKMBINDEX, TROPONINI in the last 168 hours. D-Dimer No results for input(s): DDIMER in the last 72 hours. BNP: Invalid input(s):  POCBNP CBG: No results for input(s): GLUCAP in the last 168 hours. Anemia work up No results for input(s): VITAMINB12, FOLATE, FERRITIN, TIBC, IRON, RETICCTPCT in the last 72 hours. Urinalysis    Component Value Date/Time   COLORURINE YELLOW 09/16/2019 2346   APPEARANCEUR HAZY (A) 09/16/2019 2346   LABSPEC 1.011 09/16/2019 2346   PHURINE 6.0 09/16/2019 2346   GLUCOSEU NEGATIVE 09/16/2019 2346   HGBUR SMALL (A) 09/16/2019 2346   BILIRUBINUR NEGATIVE 09/16/2019 2346   [place] 09/16/2019 2346   PROTEINUR 100 (A) 09/16/2019 2346   UROBILINOGEN 0.2 02/05/2015 0555   NITRITE NEGATIVE 09/16/2019 2346   LEUKOCYTESUR LARGE (A) 09/16/2019 2346   Sepsis Labs Invalid input(s): PROCALCITONIN,  WBC,  LACTICIDVEN     SIGNED:  Terrilee Croak, MD  [facility] 01/29/2020, 3:23 PM

## 2020-01-30 NOTE — Progress Notes (Signed)
Placed patient on [facility] per MD's request RN aware, took patient off of HIGH FLOW [place] and NRB SATS 92% HR 90.

## 2020-01-30 DEATH — deceased

## 2020-01-31 LAB — CULTURE, BLOOD (ROUTINE X 2)
Culture: NO GROWTH
Culture: NO GROWTH
Special Requests: ADEQUATE
Special Requests: ADEQUATE
